# Patient Record
Sex: Male | Born: 1955
Health system: Southern US, Community
[De-identification: ages and names within clinical notes are randomized; demographics above are authoritative.]

## PROBLEM LIST (undated history)

## (undated) DIAGNOSIS — K121 Other forms of stomatitis: Secondary | ICD-10-CM

## (undated) DIAGNOSIS — K219 Gastro-esophageal reflux disease without esophagitis: Secondary | ICD-10-CM

## (undated) DIAGNOSIS — C7951 Secondary malignant neoplasm of bone: Secondary | ICD-10-CM

## (undated) DIAGNOSIS — F329 Major depressive disorder, single episode, unspecified: Secondary | ICD-10-CM

## (undated) DIAGNOSIS — K449 Diaphragmatic hernia without obstruction or gangrene: Secondary | ICD-10-CM

## (undated) DIAGNOSIS — J45909 Unspecified asthma, uncomplicated: Secondary | ICD-10-CM

## (undated) DIAGNOSIS — Z8619 Personal history of other infectious and parasitic diseases: Secondary | ICD-10-CM

## (undated) DIAGNOSIS — R059 Cough, unspecified: Secondary | ICD-10-CM

## (undated) DIAGNOSIS — F419 Anxiety disorder, unspecified: Secondary | ICD-10-CM

## (undated) DIAGNOSIS — F32A Depression, unspecified: Secondary | ICD-10-CM

## (undated) DIAGNOSIS — Z923 Personal history of irradiation: Secondary | ICD-10-CM

## (undated) DIAGNOSIS — Z86718 Personal history of other venous thrombosis and embolism: Secondary | ICD-10-CM

## (undated) DIAGNOSIS — G8929 Other chronic pain: Secondary | ICD-10-CM

## (undated) DIAGNOSIS — Z973 Presence of spectacles and contact lenses: Secondary | ICD-10-CM

## (undated) DIAGNOSIS — Z87820 Personal history of traumatic brain injury: Secondary | ICD-10-CM

## (undated) DIAGNOSIS — T7840XA Allergy, unspecified, initial encounter: Secondary | ICD-10-CM

## (undated) DIAGNOSIS — J22 Unspecified acute lower respiratory infection: Secondary | ICD-10-CM

## (undated) DIAGNOSIS — N200 Calculus of kidney: Secondary | ICD-10-CM

## (undated) DIAGNOSIS — M549 Dorsalgia, unspecified: Secondary | ICD-10-CM

## (undated) DIAGNOSIS — K5792 Diverticulitis of intestine, part unspecified, without perforation or abscess without bleeding: Secondary | ICD-10-CM

## (undated) DIAGNOSIS — C9000 Multiple myeloma not having achieved remission: Secondary | ICD-10-CM

## (undated) DIAGNOSIS — R05 Cough: Secondary | ICD-10-CM

## (undated) DIAGNOSIS — Z5189 Encounter for other specified aftercare: Secondary | ICD-10-CM

## (undated) HISTORY — DX: Encounter for other specified aftercare: Z51.89

## (undated) HISTORY — DX: Unspecified asthma, uncomplicated: J45.909

## (undated) HISTORY — DX: Multiple myeloma not having achieved remission: C90.00

## (undated) HISTORY — PX: LAPAROSCOPIC INGUINAL HERNIA REPAIR: SUR788

## (undated) HISTORY — PX: KYPHOPLASTY: SHX5884

## (undated) HISTORY — DX: Allergy, unspecified, initial encounter: T78.40XA

## (undated) HISTORY — DX: Personal history of other infectious and parasitic diseases: Z86.19

## (undated) HISTORY — DX: Personal history of irradiation: Z92.3

## (undated) HISTORY — PX: OTHER SURGICAL HISTORY: SHX169

## (undated) HISTORY — PX: TONSILLECTOMY: SUR1361

## (undated) HISTORY — DX: Diverticulitis of intestine, part unspecified, without perforation or abscess without bleeding: K57.92

## (undated) HISTORY — PX: COLONOSCOPY: SHX174

## (undated) HISTORY — PX: WISDOM TOOTH EXTRACTION: SHX21

---

## 2001-07-14 HISTORY — PX: ROTATOR CUFF REPAIR: SHX139

## 2008-06-21 ENCOUNTER — Encounter: Admission: RE | Admit: 2008-06-21 | Discharge: 2008-06-21 | Payer: Self-pay | Admitting: Sports Medicine

## 2008-07-03 ENCOUNTER — Encounter: Admission: RE | Admit: 2008-07-03 | Discharge: 2008-07-03 | Payer: Self-pay | Admitting: Sports Medicine

## 2010-03-14 ENCOUNTER — Ambulatory Visit: Payer: Self-pay | Admitting: Diagnostic Radiology

## 2010-03-14 ENCOUNTER — Ambulatory Visit (HOSPITAL_BASED_OUTPATIENT_CLINIC_OR_DEPARTMENT_OTHER): Admission: RE | Admit: 2010-03-14 | Discharge: 2010-03-14 | Payer: Self-pay | Admitting: Family Medicine

## 2010-03-15 ENCOUNTER — Encounter: Admission: RE | Admit: 2010-03-15 | Discharge: 2010-03-15 | Payer: Self-pay | Admitting: Family Medicine

## 2013-11-02 ENCOUNTER — Encounter (INDEPENDENT_AMBULATORY_CARE_PROVIDER_SITE_OTHER): Payer: Self-pay | Admitting: Surgery

## 2013-11-02 ENCOUNTER — Ambulatory Visit (INDEPENDENT_AMBULATORY_CARE_PROVIDER_SITE_OTHER): Payer: Managed Care, Other (non HMO) | Admitting: Surgery

## 2013-11-02 VITALS — BP 140/80 | HR 66 | Temp 97.7°F | Resp 16 | Ht 70.0 in | Wt 188.2 lb

## 2013-11-02 DIAGNOSIS — K4020 Bilateral inguinal hernia, without obstruction or gangrene, not specified as recurrent: Secondary | ICD-10-CM

## 2013-11-02 DIAGNOSIS — M545 Low back pain, unspecified: Secondary | ICD-10-CM | POA: Insufficient documentation

## 2013-11-02 NOTE — Patient Instructions (Signed)
Please consider the recommendations that we have given you today:  Consider surgery to repair the hernia in her left groin with laparoscopic exploration of right groin to make sure there is not a hernia as well.  See the Handout(s) we have given you.  Please call our office at [phone] if you wish to schedule surgery or if you have further questions / concerns.   Hernia A hernia occurs when an internal organ pushes out through a weak spot in the abdominal wall. Hernias most commonly occur in the groin and around the navel. Hernias often can be pushed back into place (reduced). Most hernias tend to get worse over time. Some abdominal hernias can get stuck in the opening (irreducible or incarcerated hernia) and cannot be reduced. An irreducible abdominal hernia which is tightly squeezed into the opening is at risk for impaired blood supply (strangulated hernia). A strangulated hernia is a medical emergency. Because of the risk for an irreducible or strangulated hernia, surgery may be recommended to repair a hernia. CAUSES   Heavy lifting.  Prolonged coughing.  Straining to have a bowel movement.  A cut (incision) made during an abdominal surgery. HOME CARE INSTRUCTIONS   Bed rest is not required. You may continue your normal activities.  Avoid lifting more than 10 pounds (4.5 kg) or straining.  Cough gently. If you are a smoker it is best to stop. Even the best hernia repair can break down with the continual strain of coughing. Even if you do not have your hernia repaired, a cough will continue to aggravate the problem.  Do not wear anything tight over your hernia. Do not try to keep it in with an outside bandage or truss. These can damage abdominal contents if they are trapped within the hernia sac.  Eat a normal diet.  Avoid constipation. Straining over long periods of time will increase hernia size and encourage breakdown of repairs. If you cannot do this with diet alone, stool  softeners may be used. SEEK IMMEDIATE MEDICAL CARE IF:   You have a fever.  You develop increasing abdominal pain.  You feel nauseous or vomit.  Your hernia is stuck outside the abdomen, looks discolored, feels hard, or is tender.  You have any changes in your bowel habits or in the hernia that are unusual for you.  You have increased pain or swelling around the hernia.  You cannot push the hernia back in place by applying gentle pressure while lying down. MAKE SURE YOU:   Understand these instructions.  Will watch your condition.  Will get help right away if you are not doing well or get worse. Document Released: 06/30/2005 Document Revised: 09/22/2011 Document Reviewed: 02/17/2008 [facility] Patient Information 2014 [place].  HERNIA REPAIR: POST OP INSTRUCTIONS  1. DIET: Follow a light bland diet the first 24 hours after arrival home, such as soup, liquids, crackers, etc.  Be sure to include lots of fluids daily.  Avoid fast food or heavy meals as your are more likely to get nauseated.  Eat a low fat the next few days after surgery. 2. Take your usually prescribed home medications unless otherwise directed. 3. PAIN CONTROL: a. Pain is best controlled by a usual combination of three different methods TOGETHER: i. Ice/Heat ii. Over the counter pain medication iii. Prescription pain medication b. Most patients will experience some swelling and bruising around the hernia(s) such as the bellybutton, groins, or old incisions.  Ice packs or heating pads (30-60 minutes up to  6 times a day) will help. Use ice for the first few days to help decrease swelling and bruising, then switch to heat to help relax tight/sore spots and speed recovery.  Some people prefer to use ice alone, heat alone, alternating between ice & heat.  Experiment to what works for you.  Swelling and bruising can take several weeks to resolve.   c. It is helpful to take an over-the-counter pain medication  regularly for the first few weeks.  Choose one of the following that works best for you: i. Naproxen (Aleve, etc)  Two 220mg  tabs twice a day ii. Ibuprofen (Advil, etc) Three 200mg  tabs four times a day (every meal & bedtime) iii. Acetaminophen (Tylenol, etc) 325-650mg  four times a day (every meal & bedtime) d. A  prescription for pain medication should be given to you upon discharge.  Take your pain medication as prescribed.  i. If you are having problems/concerns with the prescription medicine (does not control pain, nausea, vomiting, rash, itching, etc), please call us [phone] to see if we need to switch you to a different pain medicine that will work better for you and/or control your side effect better. ii. If you need a refill on your pain medication, please contact your pharmacy.  They will contact our office to request authorization. Prescriptions will not be filled after 5 pm or on week-ends. 4. Avoid getting constipated.  Between the surgery and the pain medications, it is common to experience some constipation.  Increasing fluid intake and taking a fiber supplement (such as Metamucil, Citrucel, FiberCon, MiraLax, etc) 1-2 times a day regularly will usually help prevent this problem from occurring.  A mild laxative (prune juice, Milk of Magnesia, MiraLax, etc) should be taken according to package directions if there are no bowel movements after 48 hours.   5. Wash / shower every day.  You may shower over the dressings as they are waterproof.   6. Remove your waterproof bandages 5 days after surgery.  You may leave the incision open to air.  You may replace a dressing/Band-Aid to cover the incision for comfort if you wish.  Continue to shower over incision(s) after the dressing is off.    7. ACTIVITIES as tolerated:   a. You may resume regular (light) daily activities beginning the next day-such as daily self-care, walking, climbing stairs-gradually increasing activities as tolerated.   If you can walk 30 minutes without difficulty, it is safe to try more intense activity such as jogging, treadmill, bicycling, low-impact aerobics, swimming, etc. b. Save the most intensive and strenuous activity for last such as sit-ups, heavy lifting, contact sports, etc  Refrain from any heavy lifting or straining until you are off narcotics for pain control.   c. DO NOT PUSH THROUGH PAIN.  Let pain be your guide: If it hurts to do something, don't do it.  Pain is your body warning you to avoid that activity for another week until the pain goes down. d. You may drive when you are no longer taking prescription pain medication, you can comfortably wear a seatbelt, and you can safely maneuver your car and apply brakes. e. Dennis Bast may have sexual intercourse when it is comfortable.  8. FOLLOW UP in our office a. Please call [facility] at [phone] to set up an appointment to see your surgeon in the office for a follow-up appointment approximately 2-3 weeks after your surgery. b. Make sure that you call for this appointment the day you arrive home  to insure a convenient appointment time. 9.  IF YOU HAVE DISABILITY OR FAMILY LEAVE FORMS, BRING THEM TO THE OFFICE FOR PROCESSING.  DO NOT GIVE THEM TO YOUR DOCTOR.  WHEN TO CALL us [phone]: 1. Poor pain control 2. Reactions / problems with new medications (rash/itching, nausea, etc)  3. Fever over 101.5 F (38.5 C) 4. Inability to urinate 5. Nausea and/or vomiting 6. Worsening swelling or bruising 7. Continued bleeding from incision. 8. Increased pain, redness, or drainage from the incision   The clinic staff is available to answer your questions during regular business hours (8:30am-5pm).  Please don't hesitate to call and ask to speak to one of our nurses for clinical concerns.   If you have a medical emergency, go to the nearest emergency room or call 911.  A surgeon from [facility] is always on call at the hospitals in  [facility], [place], [place], [place], [place]  [address] ?  [address] [address], [place], [place]   [address] MAIN: [phone] ? TOLL FREE: [phone] ? FAX: [phone] [email or web address]  Managing Pain  Pain after surgery or related to activity is often due to strain/injury to muscle, tendon, nerves and/or incisions.  This pain is usually short-term and will improve in a few months.   Many people find it helpful to do the following things TOGETHER to help speed the process of healing and to get back to regular activity more quickly:  1. Avoid heavy physical activity a.  no lifting greater than 20 pounds b. Do not "push through" the pain.  Listen to your body and avoid positions and maneuvers than reproduce the pain c. Walking is okay as tolerated, but go slowly and stop when getting sore.  d. Remember: If it hurts to do it, then don't do it! 2. Take Anti-inflammatory medication  a. Take with food/snack around the clock for 1-2 weeks i. This helps the muscle and nerve tissues become less irritable and calm down faster b. Choose ONE of the following over-the-counter medications: i. Naproxen 220mg  tabs (ex. Aleve) 1-2 pills twice a day  ii. Ibuprofen 200mg  tabs (ex. Advil, Motrin) 3-4 pills with every meal and just before bedtime iii. Acetaminophen 500mg  tabs (Tylenol) 1-2 pills with every meal and just before bedtime 3. Use a Heating pad or Ice/Cold Pack a. 4-6 times a day b. May use warm bath/hottub  or showers 4. Try Gentle Massage and/or Stretching  a. at the area of pain many times a day b. stop if you feel pain - do not overdo it  Try these steps together to help you body heal faster and avoid making things get worse.  Doing just one of these things may not be enough.    If you are not getting better after two weeks or are noticing you are getting worse, contact our office for further advice; we may need to re-evaluate you  & see what other things we can do to help.  Back Pain, Adult Low back pain is very common. About 1 in 5 people have back pain.The cause of low back pain is rarely dangerous. The pain often gets better over time.About half of people with a sudden onset of back pain feel better in just 2 weeks. About 8 in 10 people feel better by 6 weeks.  CAUSES Some common causes of back pain include:  Strain of the muscles or ligaments supporting the spine.  Wear and tear (degeneration)  of the spinal discs.  Arthritis.  Direct injury to the back. DIAGNOSIS Most of the time, the direct cause of low back pain is not known.However, back pain can be treated effectively even when the exact cause of the pain is unknown.Answering your caregiver's questions about your overall health and symptoms is one of the most accurate ways to make sure the cause of your pain is not dangerous. If your caregiver needs more information, he or she may order lab work or imaging tests (X-rays or MRIs).However, even if imaging tests show changes in your back, this usually does not require surgery. HOME CARE INSTRUCTIONS For many people, back pain returns.Since low back pain is rarely dangerous, it is often a condition that people can learn to [facility] their own.   Remain active. It is stressful on the back to sit or stand in one place. Do not sit, drive, or stand in one place for more than 30 minutes at a time. Take short walks on level surfaces as soon as pain allows.Try to increase the length of time you walk each day.  Do not stay in bed.Resting more than 1 or 2 days can delay your recovery.  Do not avoid exercise or work.Your body is made to move.It is not dangerous to be active, even though your back may hurt.Your back will likely heal faster if you return to being active before your pain is gone.  Pay attention to your body when you bend and lift. Many people have less discomfortwhen lifting if they bend their  knees, keep the load close to their bodies,and avoid twisting. Often, the most comfortable positions are those that put less stress on your recovering back.  Find a comfortable position to sleep. Use a firm mattress and lie on your side with your knees slightly bent. If you lie on your back, put a pillow under your knees.  Only take over-the-counter or prescription medicines as directed by your caregiver. Over-the-counter medicines to reduce pain and inflammation are often the most helpful.Your caregiver may prescribe muscle relaxant drugs.These medicines help dull your pain so you can more quickly return to your normal activities and healthy exercise.  Put ice on the injured area.  Put ice in a plastic bag.  Place a towel between your skin and the bag.  Leave the ice on for 15-20 minutes, 03-04 times a day for the first 2 to 3 days. After that, ice and heat may be alternated to reduce pain and spasms.  Ask your caregiver about trying back exercises and gentle massage. This may be of some benefit.  Avoid feeling anxious or stressed.Stress increases muscle tension and can worsen back pain.It is important to recognize when you are anxious or stressed and learn ways to manage it.Exercise is a great option. SEEK MEDICAL CARE IF:  You have pain that is not relieved with rest or medicine.  You have pain that does not improve in 1 week.  You have new symptoms.  You are generally not feeling well. SEEK IMMEDIATE MEDICAL CARE IF:   You have pain that radiates from your back into your legs.  You develop new bowel or bladder control problems.  You have unusual weakness or numbness in your arms or legs.  You develop nausea or vomiting.  You develop abdominal pain.  You feel faint. Document Released: 06/30/2005 Document Revised: 12/30/2011 Document Reviewed: 11/18/2010 [facility] Patient Information 2014 [place], [place].

## 2013-11-02 NOTE — Progress Notes (Signed)
Subjective:     Patient ID: Jorge [name], male   DOB: 1956-05-04, 58 y.o.   MRN: [id number]  HPI  Note: This dictation was prepared with Dragon/digital dictation along with [facility] technology. Any transcriptional errors that result from this process are unintentional.       Jorge [name]  05/13/1956 [id number]  Patient Care Team: Orpah Melter, MD as PCP - General (Family Medicine) Corine Shelter, PA-C as Physician Assistant (Physician Assistant)  This patient is a 58 y.o.male who presents today for surgical evaluation at the request of Corine Shelter, PA-C.   Reason for visit: LLQ abd bulge ? Hernia  Pleasant active male.  Does a lot of running an aggressive exercise.  Had an episode of sharp back pain after pulling luggage from a high location in the airport.  The noticed some bulging in his left groin.  Went to a Restaurant manager, fast food.  Back seems to be doing better.  Back exercising.  However concern for the bulge.  Saw his primary care physician.  They were concerned for an anal hernia.  Recommended surgical consultation.  Patient has no problems with urination.  No problem with erection or ejaculation.  Normally has a bowel movement every day.  Has had 2 colonoscopies, the last in 2013.  Does not recall anything about polyps.  No fevers or chills.  No personal nor family history of GI/colon cancer, inflammatory bowel disease, irritable bowel syndrome, allergy such as Celiac Sprue, dietary/dairy problems, colitis, ulcers nor gastritis.  No recent sick contacts/gastroenteritis.  No travel outside the country.  No changes in diet.  No dysphagia to solids or liquids.  No significant heartburn or reflux.  No hematochezia, hematemesis, coffee ground emesis.  No evidence of prior gastric/peptic ulceration. No history of skin infection for MRSA.  No history of abdominal or hernia surgeries.  There are no active problems to display for this patient.   History reviewed. No pertinent past medical  history.  Past Surgical History  Procedure Laterality Date  . Rotator cuff repair  2003    History   Social History  . Marital Status: Married    Spouse Name: N/A    Number of Children: N/A  . Years of Education: N/A   Occupational History  . Not on file.   Social History Main Topics  . Smoking status: Former Smoker    Quit date: 11/02/2013  . Smokeless tobacco: Not on file  . Alcohol Use: Yes  . Drug Use: No  . Sexual Activity: Not on file   Other Topics Concern  . Not on file   Social History Narrative  . No narrative on file    Family History  Problem Relation Age of Onset  . Cancer Mother     ovarian and colon  . Heart disease Father     Current Outpatient Prescriptions  Medication Sig Dispense Refill  . zolpidem (AMBIEN) 5 MG tablet Take 5 mg by mouth at bedtime as needed for sleep.       No current facility-administered medications for this visit.     No Known Allergies  BP 140/80  Pulse 66  Temp(Src) 97.7 F (36.5 C)  Resp 16  Ht 5\' 10"  (1.778 m)  Wt 188 lb 3.2 oz (85.367 kg)  BMI 27.00 kg/m2  No results found.   Review of Systems  Constitutional: Negative for fever, chills and diaphoresis.  HENT: Negative for ear discharge, facial swelling, mouth sores, nosebleeds, sore throat and trouble swallowing.   Eyes: Negative  for photophobia, discharge and visual disturbance.  Respiratory: Negative for choking, chest tightness, shortness of breath and stridor.   Cardiovascular: Negative for chest pain and palpitations.  Gastrointestinal: Negative for nausea, vomiting, abdominal pain, diarrhea, constipation, blood in stool, abdominal distention, anal bleeding and rectal pain.  Endocrine: Negative for cold intolerance and heat intolerance.  Genitourinary: Negative for dysuria, urgency, difficulty urinating and testicular pain.  Musculoskeletal: Positive for back pain. Negative for arthralgias, gait problem and myalgias.  Skin: Negative for color  change, pallor, rash and wound.  Allergic/Immunologic: Negative for environmental allergies and food allergies.  Neurological: Negative for dizziness, speech difficulty, weakness, numbness and headaches.  Hematological: Negative for adenopathy. Does not bruise/bleed easily.  Psychiatric/Behavioral: Negative for hallucinations, confusion and agitation.       Objective:   Physical Exam  Constitutional: He is oriented to person, place, and time. He appears well-developed and well-nourished. No distress.  HENT:  Head: Normocephalic.  Mouth/Throat: Oropharynx is clear and moist. No oropharyngeal exudate.  Eyes: Conjunctivae and EOM are normal. Pupils are equal, round, and reactive to light. No scleral icterus.  Neck: Normal range of motion. Neck supple. No tracheal deviation present.  Cardiovascular: Normal rate, regular rhythm and intact distal pulses.   Pulmonary/Chest: Effort normal and breath sounds normal. No respiratory distress.  Abdominal: Soft. He exhibits no distension. There is no tenderness. A hernia is present. Hernia confirmed positive in the left inguinal area.  Genitourinary: Testes normal and penis normal.    Right testis shows no mass and no tenderness. Right testis is descended. Left testis shows no mass and no tenderness. Left testis is descended. Circumcised.  Musculoskeletal: Normal range of motion. He exhibits no tenderness.  Lymphadenopathy:    He has no cervical adenopathy.       Right: No inguinal adenopathy present.       Left: No inguinal adenopathy present.  Neurological: He is alert and oriented to person, place, and time. No cranial nerve deficit. He exhibits normal muscle tone. Coordination normal.  Skin: Skin is warm and dry. No rash noted. He is not diaphoretic. No erythema. No pallor.  Psychiatric: He has a normal mood and affect. His behavior is normal. Judgment and thought content normal.       Assessment:     Definite left and probable right  inguinal hernias     Plan:     I think he would benefit from surgical repair given his young age and aggressive physical activity.  Likelihood of these becoming symptomatic and larger very high in his lifetime.  He wishes to be aggressive and have them repaired.  I discussed this with him:  The anatomy & physiology of the abdominal wall and pelvic floor was discussed.  The pathophysiology of hernias in the inguinal and pelvic region was discussed.  Natural history risks such as progressive enlargement, pain, incarceration & strangulation was discussed.   Contributors to complications such as smoking, obesity, diabetes, prior surgery, etc were discussed.    I feel the risks of no intervention will lead to serious problems that outweigh the operative risks; therefore, I recommended surgery to reduce and repair the hernia.  I explained laparoscopic techniques with possible need for an open approach.  I noted usual use of mesh to patch and/or buttress hernia repair  Risks such as bleeding, infection, abscess, need for further treatment, heart attack, death, and other risks were discussed.  I noted a good likelihood this will help address the problem.   Goals  of post-operative recovery were discussed as well.  Possibility that this will not correct all symptoms was explained.  I stressed the importance of low-impact activity, aggressive pain control, avoiding constipation, & not pushing through pain to minimize risk of post-operative chronic pain or injury. Possibility of reherniation was discussed.  We will work to minimize complications.     An educational handout further explaining the pathology & treatment options was given as well.  Questions were answered.  The patient expresses understanding & wishes to proceed with surgery.

## 2013-12-16 ENCOUNTER — Other Ambulatory Visit (INDEPENDENT_AMBULATORY_CARE_PROVIDER_SITE_OTHER): Payer: Self-pay

## 2013-12-16 DIAGNOSIS — K4020 Bilateral inguinal hernia, without obstruction or gangrene, not specified as recurrent: Secondary | ICD-10-CM

## 2013-12-16 MED ORDER — OXYCODONE-ACETAMINOPHEN 5-325 MG PO TABS: 1.0000 | ORAL_TABLET | Freq: Four times a day (QID) | ORAL | Status: DC | PRN

## 2014-01-04 ENCOUNTER — Ambulatory Visit (INDEPENDENT_AMBULATORY_CARE_PROVIDER_SITE_OTHER): Payer: Managed Care, Other (non HMO) | Admitting: Surgery

## 2014-01-04 ENCOUNTER — Encounter (INDEPENDENT_AMBULATORY_CARE_PROVIDER_SITE_OTHER): Payer: Self-pay | Admitting: Surgery

## 2014-01-04 VITALS — BP 142/82 | HR 60 | Temp 97.4°F | Resp 14 | Ht 70.0 in | Wt 185.6 lb

## 2014-01-04 DIAGNOSIS — K4020 Bilateral inguinal hernia, without obstruction or gangrene, not specified as recurrent: Secondary | ICD-10-CM

## 2014-01-04 NOTE — Progress Notes (Signed)
Subjective:     Patient ID: Jorge [name], male   DOB: [birth year], 58 y.o.   MRN: [id number]  HPI  Note: This dictation was prepared with Dragon/digital dictation along with [facility] technology. Any transcriptional errors that result from this process are unintentional.       Jorge [name]  September 09, 1955 [id number]  Patient Care Team: Orpah Melter, MD as PCP - General (Family Medicine) Corine Shelter, PA-C as Physician Assistant (Physician Assistant)  Procedure (Date: 12/16/2013):  Laparoscopic bilateral inguinal hernia repairs with mesh  This patient returns for surgical re-evaluation.  He is feeling well.  Back to some jogging but not all of runs yet.  Hoping to hike in [place], [place] in September.  Wants to get back to some weight training over the next month.  No problems with urination.  No constipation.  No fevers or chills.  Off narcotics.  Patient Active Problem List   Diagnosis Date Noted  . LBP (low back pain) 11/02/2013  . Bilateral inguinal hernia (BIH), s/p lap repair 12/16/2013 11/02/2013    History reviewed. No pertinent past medical history.  Past Surgical History  Procedure Laterality Date  . Rotator cuff repair  2003  . Colonoscopy  [id number]  . Inguinal hernia repair  12/16/13    BIH by Dr. Nivin Boston    History   Social History  . Marital Status: Married    Spouse Name: N/A    Number of Children: N/A  . Years of Education: N/A   Occupational History  . Not on file.   Social History Main Topics  . Smoking status: Former Smoker    Quit date: 11/02/2013  . Smokeless tobacco: Not on file  . Alcohol Use: Yes  . Drug Use: No  . Sexual Activity: Not on file   Other Topics Concern  . Not on file   Social History Narrative  . No narrative on file    Family History  Problem Relation Age of Onset  . Cancer Mother     ovarian and colon  . Heart disease Father     Current Outpatient Prescriptions  Medication Sig Dispense Refill  .  zolpidem (AMBIEN) 5 MG tablet Take 5 mg by mouth at bedtime as needed for sleep.       No current facility-administered medications for this visit.     No Known Allergies  BP 142/82  Pulse 60  Temp(Src) 97.4 F (36.3 C) (Temporal)  Resp 14  Ht 5\' 10"  (1.778 m)  Wt 185 lb 9.6 oz (84.188 kg)  BMI 26.63 kg/m2  No results found.   Review of Systems  Constitutional: Negative for fever, chills and diaphoresis.  HENT: Negative for sore throat and trouble swallowing.   Eyes: Negative for photophobia and visual disturbance.  Respiratory: Negative for choking and shortness of breath.   Cardiovascular: Negative for chest pain and palpitations.  Gastrointestinal: Negative for nausea, vomiting, abdominal distention, anal bleeding and rectal pain.  Genitourinary: Negative for dysuria, urgency, difficulty urinating and testicular pain.  Musculoskeletal: Negative for arthralgias, gait problem, myalgias and neck pain.  Skin: Negative for color change and rash.  Neurological: Negative for dizziness, speech difficulty, weakness and numbness.  Hematological: Negative for adenopathy.  Psychiatric/Behavioral: Negative for hallucinations, confusion and agitation.       Objective:   Physical Exam  Constitutional: He is oriented to person, place, and time. He appears well-developed and well-nourished. No distress.  HENT:  Head: Normocephalic.  Mouth/Throat: Oropharynx is clear and moist. No  oropharyngeal exudate.  Eyes: Conjunctivae and EOM are normal. Pupils are equal, round, and reactive to light. No scleral icterus.  Neck: Normal range of motion. No tracheal deviation present.  Cardiovascular: Normal rate, normal heart sounds and intact distal pulses.   Pulmonary/Chest: Effort normal. No respiratory distress.  Abdominal: Soft. He exhibits no distension. There is no tenderness. Hernia confirmed negative in the right inguinal area and confirmed negative in the left inguinal area.  Incisions  clean with normal healing ridges.  No hernias  Musculoskeletal: Normal range of motion. He exhibits no tenderness.  Neurological: He is alert and oriented to person, place, and time. No cranial nerve deficit. He exhibits normal muscle tone. Coordination normal.  Skin: Skin is warm and dry. No rash noted. He is not diaphoretic.  Psychiatric: He has a normal mood and affect. His behavior is normal.       Assessment:     Recovering well 2.5 weeks status post laparoscopic repair of bilateral pantaloon direct/indirect inguinal hernias.     Plan:     I am glad he is doing so well.  I hope that continues.  Increase activity as tolerated to regular activity.  Low impact exercise such as walking an hour a day at least ideal.  Do not push through pain.  Diet as tolerated.  Low fat high fiber diet ideal.  Bowel regimen with 30 g fiber a day and fiber supplement as needed to avoid problems.  Return to clinic as needed.   Instructions discussed.  Followup with primary care physician for other health issues as would normally be done.  Consider screening for malignancies (breast, prostate, colon, melanoma, etc) as appropriate.  Questions answered.  The patient expressed understanding and appreciation

## 2014-01-04 NOTE — Patient Instructions (Signed)
HERNIA REPAIR: POST OP INSTRUCTIONS  1. DIET: Follow a light bland diet the first 24 hours after arrival home, such as soup, liquids, crackers, etc.  Be sure to include lots of fluids daily.  Avoid fast food or heavy meals as your are more likely to get nauseated.  Eat a low fat the next few days after surgery. 2. Take your usually prescribed home medications unless otherwise directed. 3. PAIN CONTROL: a. Pain is best controlled by a usual combination of three different methods TOGETHER: i. Ice/Heat ii. Over the counter pain medication iii. Prescription pain medication b. Most patients will experience some swelling and bruising around the hernia(s) such as the bellybutton, groins, or old incisions.  Ice packs or heating pads (30-60 minutes up to 6 times a day) will help. Use ice for the first few days to help decrease swelling and bruising, then switch to heat to help relax tight/sore spots and speed recovery.  Some people prefer to use ice alone, heat alone, alternating between ice & heat.  Experiment to what works for you.  Swelling and bruising can take several weeks to resolve.   c. It is helpful to take an over-the-counter pain medication regularly for the first few weeks.  Choose one of the following that works best for you: i. Naproxen (Aleve, etc)  Two 220mg tabs twice a day ii. Ibuprofen (Advil, etc) Three 200mg tabs four times a day (every meal & bedtime) iii. Acetaminophen (Tylenol, etc) 325-650mg four times a day (every meal & bedtime) d. A  prescription for pain medication should be given to you upon discharge.  Take your pain medication as prescribed.  i. If you are having problems/concerns with the prescription medicine (does not control pain, nausea, vomiting, rash, itching, etc), please call us [phone] to see if we need to switch you to a different pain medicine that will work better for you and/or control your side effect better. ii. If you need a refill on your pain  medication, please contact your pharmacy.  They will contact our office to request authorization. Prescriptions will not be filled after 5 pm or on week-ends. 4. Avoid getting constipated.  Between the surgery and the pain medications, it is common to experience some constipation.  Increasing fluid intake and taking a fiber supplement (such as Metamucil, Citrucel, FiberCon, MiraLax, etc) 1-2 times a day regularly will usually help prevent this problem from occurring.  A mild laxative (prune juice, Milk of Magnesia, MiraLax, etc) should be taken according to package directions if there are no bowel movements after 48 hours.   5. Wash / shower every day.  You may shower over the dressings as they are waterproof.   6. Remove your waterproof bandages 5 days after surgery.  You may leave the incision open to air.  You may replace a dressing/Band-Aid to cover the incision for comfort if you wish.  Continue to shower over incision(s) after the dressing is off.    7. ACTIVITIES as tolerated:   a. You may resume regular (light) daily activities beginning the next day-such as daily self-care, walking, climbing stairs-gradually increasing activities as tolerated.  If you can walk 30 minutes without difficulty, it is safe to try more intense activity such as jogging, treadmill, bicycling, low-impact aerobics, swimming, etc. b. Save the most intensive and strenuous activity for last such as sit-ups, heavy lifting, contact sports, etc  Refrain from any heavy lifting or straining until you are off narcotics for pain control.     c. DO NOT PUSH THROUGH PAIN.  Let pain be your guide: If it hurts to do something, don't do it.  Pain is your body warning you to avoid that activity for another week until the pain goes down. d. You may drive when you are no longer taking prescription pain medication, you can comfortably wear a seatbelt, and you can safely maneuver your car and apply brakes. e. You may have sexual intercourse  when it is comfortable.  8. FOLLOW UP in our office a. Please call [facility] at [phone] to set up an appointment to see your surgeon in the office for a follow-up appointment approximately 2-3 weeks after your surgery. b. Make sure that you call for this appointment the day you arrive home to insure a convenient appointment time. 9.  IF YOU HAVE DISABILITY OR FAMILY LEAVE FORMS, BRING THEM TO THE OFFICE FOR PROCESSING.  DO NOT GIVE THEM TO YOUR DOCTOR.  WHEN TO CALL US [phone]: 1. Poor pain control 2. Reactions / problems with new medications (rash/itching, nausea, etc)  3. Fever over 101.5 F (38.5 C) 4. Inability to urinate 5. Nausea and/or vomiting 6. Worsening swelling or bruising 7. Continued bleeding from incision. 8. Increased pain, redness, or drainage from the incision   The clinic staff is available to answer your questions during regular business hours (8:30am-5pm).  Please don't hesitate to call and ask to speak to one of our nurses for clinical concerns.   If you have a medical emergency, go to the nearest emergency room or call 911.  A surgeon from [facility] is always on call at the hospitals in [place]  [facility] [address], [address], [place], [place]  [address] ?  [address] [address], [place],    [address] MAIN: [phone] ? TOLL FREE: [phone] ? FAX: [phone] [email or web address]  Managing Pain  Pain after surgery or related to activity is often due to strain/injury to muscle, tendon, nerves and/or incisions.  This pain is usually short-term and will improve in a few months.   Many people find it helpful to do the following things TOGETHER to help speed the process of healing and to get back to regular activity more quickly:  1. Avoid heavy physical activity a.  no lifting greater than 20 pounds b. Do not "push through" the pain.  Listen to your body and avoid positions and maneuvers than  reproduce the pain c. Walking is okay as tolerated, but go slowly and stop when getting sore.  d. Remember: If it hurts to do it, then don't do it! 2. Take Anti-inflammatory medication  a. Take with food/snack around the clock for 1-2 weeks i. This helps the muscle and nerve tissues become less irritable and calm down faster b. Choose ONE of the following over-the-counter medications: i. Naproxen 220mg tabs (ex. Aleve) 1-2 pills twice a day  ii. Ibuprofen 200mg tabs (ex. Advil, Motrin) 3-4 pills with every meal and just before bedtime iii. Acetaminophen 500mg tabs (Tylenol) 1-2 pills with every meal and just before bedtime 3. Use a Heating pad or Ice/Cold Pack a. 4-6 times a day b. May use warm bath/hottub  or showers 4. Try Gentle Massage and/or Stretching  a. at the area of pain many times a day b. stop if you feel pain - do not overdo it  Try these steps together to help you body heal faster and avoid making things get worse.  Doing just one of these   things may not be enough.    If you are not getting better after two weeks or are noticing you are getting worse, contact our office for further advice; we may need to re-evaluate you & see what other things we can do to help.   

## 2015-04-26 ENCOUNTER — Encounter: Payer: Self-pay | Admitting: Physical Therapy

## 2015-04-26 ENCOUNTER — Ambulatory Visit: Payer: 59 | Attending: Sports Medicine | Admitting: Physical Therapy

## 2015-04-26 DIAGNOSIS — M629 Disorder of muscle, unspecified: Secondary | ICD-10-CM | POA: Diagnosis present

## 2015-04-26 DIAGNOSIS — M5442 Lumbago with sciatica, left side: Secondary | ICD-10-CM | POA: Diagnosis present

## 2015-04-26 NOTE — Therapy (Signed)
[address] [place], [place], [address] Phone: [phone]   Fax:  [phone]  Physical Therapy Evaluation  Patient Details  Name: Jorge [name] MRN: [id number] Date of Birth: Aug 13, 1955 Referring Provider:  Berle Mull, MD  Encounter Date: 04/26/2015      PT End of Session - 04/26/15 0836    Visit Number 1   Date for PT Re-Evaluation 06/26/15   PT Start Time 0802   PT Stop Time 0900   PT Time Calculation (min) 58 min   Activity Tolerance Patient tolerated treatment well   Behavior During Therapy [facility] for tasks assessed/performed      History reviewed. No pertinent past medical history.  Past Surgical History  Procedure Laterality Date  . Rotator cuff repair  2003  . Colonoscopy  [id number]  . Inguinal hernia repair  12/16/13    BIH by Dr. Darrious Boston    There were no vitals filed for this visit.  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  Reports started having significant pain in the back in July, reports that he has had this problem before.   Limitations Sitting;Lifting;Standing;Walking   Diagnostic tests MRI shows HNP L4-5   Patient Stated Goals less pain for ADL's   Currently in Pain? Yes   Pain Score 2    Pain Location Back   Pain Orientation Lower   Pain Descriptors / Indicators Aching   Pain Type Chronic pain   Pain Onset More than a month ago   Pain Frequency Constant   Aggravating Factors  activity and lifting will increase pain to 8/10   Pain Relieving Factors rest pain can be 1/10   Effect of Pain on Daily Activities limits me             [facility] PT Assessment - 04/26/15 0001    Assessment   Medical Diagnosis low back pain   Onset Date/Surgical Date 04/20/15   Prior Therapy chiropractor   Precautions   Precautions None   Balance Screen   Has the patient fallen in the past 6 months No   Has the patient had a decrease in activity level because of a fear of falling?  No   Is the patient reluctant to leave their home because of a fear of falling?  No   Home Environment   Additional Comments some housework   Prior Function   Level of Independence Independent   Vocation Full time employment   Vocation Requirements cleans windows, pressure washer, own company   Leisure 5x/week at [employer] and was running daily   Posture/Postural Control   Posture Comments fwd head, rounded shoulders, presents with a right lateral shift   AROM   Overall AROM Comments lumbar ROM decreased 50% with pain and tightness   Strength   Overall Strength Comments WFL's but he reports that he feels that he is weaker than previously   Palpation   Palpation comment tight and tender in the left lumbar and left buttock   Special Tests    Special Tests --  negative slump and SLR today  [facility] Adult PT Treatment/Exercise - 04/26/15 0001    Modalities   Modalities Electrical Stimulation;Moist Heat;Traction   Moist Heat Therapy   Number Minutes Moist Heat 15 Minutes   Moist Heat Location Lumbar Spine   Electrical Stimulation   Electrical Stimulation Location left lumbar   Electrical Stimulation Action IFC   Electrical Stimulation Parameters tolerance   Electrical Stimulation Goals Pain   Traction   Type of Traction Lumbar   Min (lbs) 65   Hold Time static   Time 15                  PT Short Term Goals - 04/26/15 4680    PT SHORT TERM GOAL #1   Title independent with initial HEP   Time 1   Period Weeks   Status New           PT Long Term Goals - 04/26/15 0840    PT LONG TERM GOAL #1   Title  understand proper posture and body mechanics   Time 8   Period Weeks   Status New   PT LONG TERM GOAL #2   Title increase Lumbar ROM to WFL's   Time 8   Period Weeks   Status New   PT LONG TERM GOAL #3   Title decrease pain 50%   Time 8   Period Weeks   Status New   PT LONG TERM GOAL #4   Title present with no lateral shift   Time 8   Period Weeks   Status New               Plan - 04/26/15 3212    Clinical Impression Statement Patient with left low back pain, MRI shows HNP atr L4-5.  HAs a lateral shift to the right.  Very tight HS and piriformis mms   Pt will benefit from skilled therapeutic intervention in order to improve on the following deficits Decreased range of motion;Decreased strength;Increased muscle spasms;Impaired flexibility;Postural dysfunction;Improper body mechanics;Pain   Rehab Potential Good   PT Frequency 2x / week   PT Duration 8 weeks   PT Treatment/Interventions ADLs/Self Care Home Management;Electrical Stimulation;Moist Heat;Therapeutic exercise;Traction;Patient/family education;Manual techniques;Passive range of motion   PT Next Visit Plan Correct lateral shift, McKenzie exercises, core and flexibility   Consulted and Agree with Plan of Care Patient         Problem List Patient Active Problem List   Diagnosis Date Noted  . LBP (low back pain) 11/02/2013  . Bilateral inguinal hernia (BIH), s/p lap repair 12/16/2013 11/02/2013    Sumner Boast., PT 04/26/2015, 8:43 AM  [address] [place], [place], [address] Phone: [phone]   Fax:  [phone]

## 2015-04-26 NOTE — Patient Instructions (Signed)
Back Hyperextension: Using Arms   Lying face down with arms bent, inhale. Then while exhaling, straighten arms. Hold __2__ seconds. Let hips sag toward floor.  Slowly return to starting position. Repeat _10___ times per set. Do _2___ sets per session. Do __2__ sessions per day.  Back Namaste   Stand, feet in stride stance. Rotate back leg out about 20, keeping heel on floor. Stand, hands on low back. Press hips forward and arch back. Hold _2__ seconds. Repeat 10___ times per session. Do _2__ sessions per day.

## 2015-05-01 ENCOUNTER — Encounter: Payer: Self-pay | Admitting: Physical Therapy

## 2015-05-01 ENCOUNTER — Ambulatory Visit: Payer: 59 | Admitting: Physical Therapy

## 2015-05-01 DIAGNOSIS — M629 Disorder of muscle, unspecified: Secondary | ICD-10-CM

## 2015-05-01 DIAGNOSIS — M5442 Lumbago with sciatica, left side: Secondary | ICD-10-CM | POA: Diagnosis not present

## 2015-05-01 NOTE — Therapy (Signed)
[address] [place], [place], [address] Phone: [phone]   Fax:  [phone]  Physical Therapy Treatment  Patient Details  Name: Jorge [name] MRN: [id number] Date of Birth: April 02, 1956 No Data Recorded  Encounter Date: 05/01/2015      PT End of Session - 05/01/15 1608    Visit Number 2   Date for PT Re-Evaluation 06/26/15   PT Start Time 1532   PT Stop Time 1619   PT Time Calculation (min) 47 min   Activity Tolerance Patient tolerated treatment well   Behavior During Therapy [facility] for tasks assessed/performed      History reviewed. No pertinent past medical history.  Past Surgical History  Procedure Laterality Date  . Rotator cuff repair  2003  . Colonoscopy  [id number]  . Inguinal hernia repair  12/16/13    BIH by Dr. Anmol Boston    There were no vitals filed for this visit.  Visit Diagnosis:  Left-sided low back pain with left-sided sciatica  Hamstring tightness of both lower extremities      Subjective Assessment - 05/01/15 1536    Subjective I think that last treatment helped.  I have been very busy today and I am hurting some now   Currently in Pain? Yes   Pain Score 2    Pain Location Back   Pain Orientation Lower   Pain Descriptors / Indicators Aching   Pain Type Chronic pain                         [facility] Adult PT Treatment/Exercise - 05/01/15 0001    Exercises   Exercises Lumbar   Lumbar Exercises: Stretches   Passive Hamstring Stretch 30 seconds;3 reps   Press Ups 5 reps;10 seconds   Quad Stretch 20 seconds;3 reps   Piriformis Stretch 20 seconds;3 reps   Lumbar Exercises: Aerobic   UBE (Upper Arm Bike) NuStep L6 x 5 minutes   Lumbar Exercises: Machines for Strengthening   Other Lumbar Machine Exercise seated rows 35#, lats 35#   Other Lumbar Machine Exercise 10# hip extension and abduction, 15# obliques   Moist Heat Therapy   Number Minutes Moist Heat  15 Minutes   Moist Heat Location Lumbar Spine   Electrical Stimulation   Electrical Stimulation Location left lumbar   Electrical Stimulation Action IFC   Electrical Stimulation Parameters tolerance   Electrical Stimulation Goals Pain   Traction   Type of Traction Lumbar   Min (lbs) 65   Hold Time static   Time 15                PT Education - 05/01/15 1607    Education provided Yes   Education Details Reviewed HEP and 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) Educated Patient   Methods Explanation;Demonstration   Comprehension Verbalized understanding          PT Short Term Goals - 04/26/15 0838    PT SHORT TERM GOAL #1   Title independent with initial HEP   Time 1   Period Weeks   Status New           PT Long Term Goals - 04/26/15 0840    PT LONG TERM GOAL #1   Title understand proper posture and body mechanics   Time 8   Period Weeks   Status New   PT LONG TERM GOAL #2   Title increase Lumbar  ROM to WFL's   Time 8   Period Weeks   Status New   PT LONG TERM GOAL #3   Title decrease pain 50%   Time 8   Period Weeks   Status New   PT LONG TERM GOAL #4   Title present with no lateral shift   Time 8   Period Weeks   Status New               Plan - 05/01/15 1614    Clinical Impression Statement Tolerated exercisesd well today, still very tight in the LE's   PT Next Visit Plan Correct lateral shift, McKenzie exercises, core and flexibility, add gym exercises   Consulted and Agree with Plan of Care Patient        Problem List Patient Active Problem List   Diagnosis Date Noted  . LBP (low back pain) 11/02/2013  . Bilateral inguinal hernia (BIH), s/p lap repair 12/16/2013 11/02/2013    Sumner Boast., PT 05/01/2015, 4:26 PM  [address] [place], [place], [address] Phone: [phone]   Fax:  [phone]  Name: Jorge [name] MRN: [id number] Date  of Birth: 1956-06-18

## 2015-05-03 ENCOUNTER — Ambulatory Visit: Payer: 59 | Admitting: Physical Therapy

## 2015-05-03 ENCOUNTER — Encounter: Payer: Self-pay | Admitting: Physical Therapy

## 2015-05-03 DIAGNOSIS — M629 Disorder of muscle, unspecified: Secondary | ICD-10-CM

## 2015-05-03 DIAGNOSIS — M5442 Lumbago with sciatica, left side: Secondary | ICD-10-CM

## 2015-05-03 NOTE — Therapy (Signed)
[place] [address] [place], [place], [address] Phone: [phone]   Fax:  [phone]  Physical Therapy Treatment  Patient Details  Name: Kaulana Brindle MRN: [id number] Date of Birth: 12-Apr-1956 No Data Recorded  Encounter Date: 05/03/2015      PT End of Session - 05/03/15 1653    Visit Number 3   Date for PT Re-Evaluation 06/26/15   PT Start Time 1605   PT Stop Time 1705   PT Time Calculation (min) 60 min   Activity Tolerance Patient tolerated treatment well   Behavior During Therapy [facility] for tasks assessed/performed      History reviewed. No pertinent past medical history.  Past Surgical History  Procedure Laterality Date  . Rotator cuff repair  2003  . Colonoscopy  [id number]  . Inguinal hernia repair  12/16/13    BIH by Dr. Quron Boston    There were no vitals filed for this visit.  Visit Diagnosis:  Left-sided low back pain with left-sided sciatica  Hamstring tightness of both lower extremities      Subjective Assessment - 05/03/15 1651    Subjective I was working very hard today, very tired and sore.   Currently in Pain? Yes   Pain Score 4    Pain Location Back   Pain Orientation Lower   Pain Descriptors / Indicators Aching;Tightness                         [facility] Adult PT Treatment/Exercise - 05/03/15 0001    Lumbar Exercises: Stretches   Passive Hamstring Stretch 30 seconds;3 reps   Press Ups 5 reps;10 seconds   Quad Stretch 20 seconds;3 reps   ITB Stretch 30 seconds;3 reps   Piriformis Stretch 20 seconds;3 reps   Lumbar Exercises: Aerobic   UBE (Upper Arm Bike) NuStep L6 x 5 minutes   Moist Heat Therapy   Number Minutes Moist Heat 15 Minutes   Moist Heat Location Lumbar Spine   Electrical Stimulation   Electrical Stimulation Location left lumbar   Electrical Stimulation Action IFC   Electrical Stimulation Parameters tolerance   Electrical Stimulation Goals Pain    Traction   Type of Traction Lumbar   Min (lbs) 65   Hold Time static   Time 15                  PT Short Term Goals - 04/26/15 0254    PT SHORT TERM GOAL #1   Title independent with initial HEP   Time 1   Period Weeks   Status New           PT Long Term Goals - 04/26/15 0840    PT LONG TERM GOAL #1   Title understand proper posture and body mechanics   Time 8   Period Weeks   Status New   PT LONG TERM GOAL #2   Title increase Lumbar ROM to WFL's   Time 8   Period Weeks   Status New   PT LONG TERM GOAL #3   Title decrease pain 50%   Time 8   Period Weeks   Status New   PT LONG TERM GOAL #4   Title present with no lateral shift   Time 8   Period Weeks   Status New               Plan - 05/03/15 1655    Clinical Impression  Statement Very tight in the LE's, reports that after stretching he feels much better.   PT Next Visit Plan Correct lateral shift, McKenzie exercises, core and flexibility, add gym exercises   Consulted and Agree with Plan of Care Patient        Problem List Patient Active Problem List   Diagnosis Date Noted  . LBP (low back pain) 11/02/2013  . Bilateral inguinal hernia (BIH), s/p lap repair 12/16/2013 11/02/2013    Sumner Boast., PT 05/03/2015, 4:57 PM  [address] [place], [place], [address] Phone: [phone]   Fax:  [phone]  Name: Montell Leopard MRN: [id number] Date of Birth: 06/24/1956

## 2015-05-08 ENCOUNTER — Encounter: Payer: Self-pay | Admitting: Physical Therapy

## 2015-05-08 ENCOUNTER — Ambulatory Visit: Payer: 59 | Admitting: Physical Therapy

## 2015-05-08 DIAGNOSIS — M629 Disorder of muscle, unspecified: Secondary | ICD-10-CM

## 2015-05-08 DIAGNOSIS — M5442 Lumbago with sciatica, left side: Secondary | ICD-10-CM

## 2015-05-08 NOTE — Therapy (Signed)
[address] [place], [place], [address] Phone: [phone]   Fax:  [phone]  Physical Therapy Treatment  Patient Details  Name: Nijee Heatwole MRN: [id number] Date of Birth: 1955-09-09 No Data Recorded  Encounter Date: 05/08/2015      PT End of Session - 05/08/15 1748    Visit Number 4   Date for PT Re-Evaluation 06/26/15   PT Start Time 1657   PT Stop Time 1758   PT Time Calculation (min) 61 min   Activity Tolerance Patient tolerated treatment well   Behavior During Therapy [facility] for tasks assessed/performed      History reviewed. No pertinent past medical history.  Past Surgical History  Procedure Laterality Date  . Rotator cuff repair  2003  . Colonoscopy  [id number]  . Inguinal hernia repair  12/16/13    BIH by Dr. Bhavesh Boston    There were no vitals filed for this visit.  Visit Diagnosis:  Left-sided low back pain with left-sided sciatica  Hamstring tightness of both lower extremities      Subjective Assessment - 05/08/15 1745    Subjective I feel like I am getting stronger, I was able to lift things at work today that I could not lift 2 weeks ago   Currently in Pain? Yes   Pain Score 4    Pain Location Back   Pain Orientation Lower   Pain Descriptors / Indicators Aching                         [facility] Adult PT Treatment/Exercise - 05/08/15 0001    Therapeutic Activites    Therapeutic Activities Work [employer]   Work [employer] went over [employer], in/out of car, lifting, foot prop, golfers lift   Lumbar Exercises: Hydrologist 30 seconds;3 Designer, television/film set 20 seconds;3 reps   ITB Stretch 30 seconds;3 reps   Piriformis Stretch 20 seconds;3 reps   Lumbar Exercises: Aerobic   UBE (Upper Arm Bike) NuStep L6 x 5 minutes   Lumbar Exercises: Machines for Strengthening   Other Lumbar Machine Exercise seated rows 35#, lats 35#   Other Lumbar  Machine Exercise 10# hip extension and abduction, 15# obliques   Moist Heat Therapy   Number Minutes Moist Heat 15 Minutes   Moist Heat Location Lumbar Spine   Electrical Stimulation   Electrical Stimulation Location left lumbar   Electrical Stimulation Action IFC   Electrical Stimulation Parameters tolerance   Electrical Stimulation Goals Pain   Traction   Type of Traction Lumbar   Min (lbs) 65   Hold Time static   Time 15                  PT Short Term Goals - 05/08/15 1749    PT SHORT TERM GOAL #1   Title independent with initial HEP   Status Achieved           PT Long Term Goals - 04/26/15 0840    PT LONG TERM GOAL #1   Title understand proper posture and body mechanics   Time 8   Period Weeks   Status New   PT LONG TERM GOAL #2   Title increase Lumbar ROM to WFL's   Time 8   Period Weeks   Status New   PT LONG TERM GOAL #3   Title decrease pain 50%   Time 8  Period Weeks   Status New   PT LONG TERM GOAL #4   Title present with no lateral shift   Time 8   Period Weeks   Status New               Plan - 05/08/15 1748    Clinical Impression Statement Very tight hip flexors and quads, needed help with in and out of car as he reported that today getting in and out of the car hurt him the most   PT Next Visit Plan continue with exercises   Consulted and Agree with Plan of Care Patient        Problem List Patient Active Problem List   Diagnosis Date Noted  . LBP (low back pain) 11/02/2013  . Bilateral inguinal hernia (BIH), s/p lap repair 12/16/2013 11/02/2013    Sumner Boast., PT 05/08/2015, 5:50 PM  [place] [address] [place], [place], [address] Phone: [phone]   Fax:  [phone]  Name: Rana Adorno MRN: [id number] Date of Birth: 1956/05/10

## 2015-05-15 ENCOUNTER — Ambulatory Visit: Payer: 59 | Admitting: Physical Therapy

## 2015-05-16 ENCOUNTER — Ambulatory Visit: Payer: 59 | Attending: Sports Medicine | Admitting: Physical Therapy

## 2015-05-16 ENCOUNTER — Encounter: Payer: Self-pay | Admitting: Physical Therapy

## 2015-05-16 DIAGNOSIS — M629 Disorder of muscle, unspecified: Secondary | ICD-10-CM | POA: Insufficient documentation

## 2015-05-16 DIAGNOSIS — M5442 Lumbago with sciatica, left side: Secondary | ICD-10-CM | POA: Diagnosis present

## 2015-05-16 NOTE — Therapy (Signed)
[place], [place], [address] Phone: [phone]   Fax:  [phone]  Physical Therapy Treatment  Patient Details  Name: Jorge [name] MRN: [id number] Date of Birth: August 08, 1955 No Data Recorded  Encounter Date: 05/16/2015      PT End of Session - 05/16/15 1737    Visit Number 5   PT Start Time 0501   PT Stop Time 0548   PT Time Calculation (min) 47 min      History reviewed. No pertinent past medical history.  Past Surgical History  Procedure Laterality Date  . Rotator cuff repair  2003  . Colonoscopy  [id number]  . Inguinal hernia repair  12/16/13    BIH by Dr. Valeriano Boston    There were no vitals filed for this visit.  Visit Diagnosis:  Left-sided low back pain with left-sided sciatica  Hamstring tightness of both lower extremities      Subjective Assessment - 05/16/15 1703    Subjective Pt pain has been decreasing significantly. He reports that he is back in the gym doing cardio and light lifting.  Getting in and out of his vehicles irritates in more than anything.   Currently in Pain? Yes   Pain Score 1    Pain Location Back   Pain Orientation Lower                         [facility] Adult PT Treatment/Exercise - 05/16/15 0001    Lumbar Exercises: Stretches   Passive Hamstring Stretch 30 seconds;3 reps   Press Ups 5 reps;10 seconds   Quad Stretch 20 seconds;3 reps   ITB Stretch 30 seconds;3 reps   Piriformis Stretch 20 seconds;3 reps   Lumbar Exercises: Aerobic   UBE (Upper Arm Bike) NuStep L6 x 6 minutes   Lumbar Exercises: Machines for Strengthening   Other Lumbar Machine Exercise seated rows 35#, lats 35#   Other Lumbar Machine Exercise 10# hip extension and abduction, 15# obliques   Moist Heat Therapy   Number Minutes Moist Heat 15 Minutes   Moist Heat Location Lumbar Spine   Electrical Stimulation   Electrical Stimulation Location left lumbar   Electrical Stimulation Action IFC   Electrical Stimulation Parameters tolerance    Electrical Stimulation Goals Pain   Traction   Type of Traction Lumbar   Min (lbs) 65   Hold Time static   Time 15                  PT Short Term Goals - 05/08/15 1749    PT SHORT TERM GOAL #1   Title independent with initial HEP   Status Achieved           PT Long Term Goals - 04/26/15 0840    PT LONG TERM GOAL #1   Title understand proper posture and body mechanics   Time 8   Period Weeks   Status New   PT LONG TERM GOAL #2   Title increase Lumbar ROM to WFL's   Time 8   Period Weeks   Status New   PT LONG TERM GOAL #3   Title decrease pain 50%   Time 8   Period Weeks   Status New   PT LONG TERM GOAL #4   Title present with no lateral shift   Time 8   Period Weeks   Status New  Plan - 05/16/15 1740    Clinical Impression Statement Pt tolerated exercises well without increase complaints of pain. Pts demonstrates increased hamstring flexibility. Unable to stretch hamstrings in prone secondary to cramping, able to gain more ROM in sidelying.    PT Next Visit Plan Continue to work on stretching and lumbar strengthening.         Problem List Patient Active Problem List   Diagnosis Date Noted  . LBP (low back pain) 11/02/2013  . Bilateral inguinal hernia (BIH), s/p lap repair 12/16/2013 11/02/2013    Crist Fat, SPTA 05/16/2015, 5:43 PM  [address] [place], [place], [address] Phone: [phone]   Fax:  [phone]  Name: Winfrey Chillemi MRN: [id number] Date of Birth: 17-Jul-1955

## 2015-05-24 ENCOUNTER — Encounter: Payer: Self-pay | Admitting: Physical Therapy

## 2015-05-24 ENCOUNTER — Ambulatory Visit: Payer: 59 | Admitting: Physical Therapy

## 2015-05-24 DIAGNOSIS — M5442 Lumbago with sciatica, left side: Secondary | ICD-10-CM

## 2015-05-24 DIAGNOSIS — M629 Disorder of muscle, unspecified: Secondary | ICD-10-CM

## 2015-05-24 NOTE — Therapy (Signed)
[address] [address] [place], [place], [address] Phone: [phone]   Fax:  [phone]  Physical Therapy Treatment  Patient Details  Name: Jorge [name] MRN: [id number] Date of Birth: 1955-11-22 No Data Recorded  Encounter Date: 05/24/2015      PT End of Session - 05/24/15 1733    Visit Number 6   Date for PT Re-Evaluation 06/26/15   PT Start Time 1700   PT Stop Time 1750   PT Time Calculation (min) 50 min   Activity Tolerance Patient tolerated treatment well   Behavior During Therapy [facility] for tasks assessed/performed      History reviewed. No pertinent past medical history.  Past Surgical History  Procedure Laterality Date  . Rotator cuff repair  2003  . Colonoscopy  [id number]  . Inguinal hernia repair  12/16/13    BIH by Dr. Kaylon Boston    There were no vitals filed for this visit.  Visit Diagnosis:  Left-sided low back pain with left-sided sciatica  Hamstring tightness of both lower extremities      Subjective Assessment - 05/24/15 1702    Subjective I have been doing very well.  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 Yes   Pain Score 3    Pain Location Back   Pain Orientation Lower   Pain Descriptors / Indicators Aching;Tightness   Pain Type Chronic pain   Aggravating Factors  sitting   Pain Relieving Factors easy movements                         [facility] Adult PT Treatment/Exercise - 05/24/15 0001    Lumbar Exercises: Stretches   Passive Hamstring Stretch 30 seconds;3 reps   Press Ups 5 reps;10 seconds   Quad Stretch 20 seconds;3 reps   ITB Stretch 30 seconds;3 reps   Piriformis Stretch 20 seconds;3 reps   Lumbar Exercises: Aerobic   UBE (Upper Arm Bike) NuStep L6 x 6 minutes   Moist Heat Therapy   Number Minutes Moist Heat 15 Minutes   Moist Heat Location Lumbar Spine   Electrical Stimulation   Electrical Stimulation Location left lumbar   Electrical Stimulation Action IFC   Electrical Stimulation Parameters tolerance   Electrical Stimulation Goals Pain   Traction   Type of Traction Lumbar   Min (lbs) 65   Hold Time static   Time 15                  PT Short Term Goals - 05/08/15 1749    PT SHORT TERM GOAL #1   Title independent with initial HEP   Status Achieved           PT Long Term Goals - 05/24/15 1735    PT LONG TERM GOAL #1   Title understand proper posture and body mechanics   Status Achieved   PT LONG TERM GOAL #2   Title increase Lumbar ROM to WFL's   Status Partially Met   PT LONG TERM GOAL #3   Title decrease pain 50%   Status Partially Met   PT LONG TERM GOAL #4   Title present with no lateral shift   Status Achieved               Plan - 05/24/15  1734    Clinical Impression Statement Patient reports overall he has been doing well but sitting at the hospital has caused an increase in pain and stiffness.  His flexibility today was less but I was able to get him to previous levels   PT Next Visit Plan Continue to work on stretching and lumbar strengthening.    Consulted and Agree with Plan of Care Patient        Problem List Patient Active Problem List   Diagnosis Date Noted  . LBP (low back pain) 11/02/2013  . Bilateral inguinal hernia (BIH), s/p lap repair 12/16/2013 11/02/2013    Sumner Boast., PT 05/24/2015, 5:36 PM  [address] [address] [place], [place], [address] Phone: [phone]   Fax:  [phone]  Name: Jorge [name] MRN: [id number] Date of Birth: [birth year]

## 2015-05-31 ENCOUNTER — Ambulatory Visit: Payer: 59 | Admitting: Physical Therapy

## 2015-05-31 ENCOUNTER — Encounter: Payer: Self-pay | Admitting: Physical Therapy

## 2015-05-31 DIAGNOSIS — M5442 Lumbago with sciatica, left side: Secondary | ICD-10-CM | POA: Diagnosis not present

## 2015-05-31 DIAGNOSIS — M629 Disorder of muscle, unspecified: Secondary | ICD-10-CM

## 2015-05-31 NOTE — Therapy (Signed)
[place], [place], [address] Phone: [phone]   Fax:  [phone]  Physical Therapy Treatment  Patient Details  Name: Jorge [name] MRN: [id number] Date of Birth: Oct 09, 1955 No Data Recorded  Encounter Date: 05/31/2015      PT End of Session - 05/31/15 1727    Visit Number 7   Date for PT Re-Evaluation 06/26/15   PT Start Time 1700   PT Stop Time 1751   PT Time Calculation (min) 51 min      History reviewed. No pertinent past medical history.  Past Surgical History  Procedure Laterality Date  . Rotator cuff repair  2003  . Colonoscopy  [id number]  . Inguinal hernia repair  12/16/13    BIH by Dr. Ken Boston    There were no vitals filed for this visit.  Visit Diagnosis:  Left-sided low back pain with left-sided sciatica  Hamstring tightness of both lower extremities      Subjective Assessment - 05/31/15 1701    Subjective Pt reports that his pain continues to decrease. He does c/o increased pain after standing all day or with quick movements.     Currently in Pain? Yes   Pain Score 1    Pain Location Back   Pain Orientation Lower   Pain Descriptors / Indicators Aching;Tightness   Pain Type Chronic pain                         [facility] Adult PT Treatment/Exercise - 05/31/15 0001    Lumbar Exercises: Stretches   Passive Hamstring Stretch 30 seconds;3 reps   Press Ups 5 reps;10 seconds   Quad Stretch 20 seconds;3 reps   ITB Stretch 30 seconds;3 reps   Piriformis Stretch 20 seconds;3 reps   Lumbar Exercises: Machines for Strengthening   Other Lumbar Machine Exercise seated rows 35#, lats 35#   Other Lumbar Machine Exercise 10# hip extension and abduction, 15# obliques   Moist Heat Therapy   Number Minutes Moist Heat 10 Minutes   Moist Heat Location Lumbar Spine   Electrical Stimulation   Electrical Stimulation Location left lumbar   Electrical Stimulation  Action IFC   Electrical Stimulation Parameters tolerance   Electrical Stimulation Goals Pain   Traction   Type of Traction Lumbar   Min (lbs) 65   Hold Time static   Time 10   Manual Therapy   Manual Therapy Passive ROM                  PT Short Term Goals - 05/08/15 1749    PT SHORT TERM GOAL #1   Title independent with initial HEP   Status Achieved           PT Long Term Goals - 05/24/15 1735    PT LONG TERM GOAL #1   Title understand proper posture and body mechanics   Status Achieved   PT LONG TERM GOAL #2   Title increase Lumbar ROM to WFL's   Status Partially Met   PT LONG TERM GOAL #3   Title decrease pain 50%   Status Partially Met   PT LONG TERM GOAL #4   Title present with no lateral shift   Status Achieved               Plan - 05/31/15 1729    Clinical Impression Statement Pt able to tolerate all exercises without any indication of  pain. Reports he is exercising outside of therapy and maybe doing too much physical labor which irritates his low back.    PT Next Visit Plan Add more resistive exercises for lumbar and LE's.         Problem List Patient Active Problem List   Diagnosis Date Noted  . LBP (low back pain) 11/02/2013  . Bilateral inguinal hernia (BIH), s/p lap repair 12/16/2013 11/02/2013    Crist Fat, SPTA 05/31/2015, 5:42 PM  [address] [place], [place], [address] Phone: [phone]   Fax:  [phone]  Name: Jorge [name] MRN: [id number] Date of Birth: 09/18/1955

## 2015-06-14 ENCOUNTER — Encounter: Payer: Self-pay | Admitting: Physical Therapy

## 2015-06-14 ENCOUNTER — Ambulatory Visit: Payer: 59 | Attending: Sports Medicine | Admitting: Physical Therapy

## 2015-06-14 DIAGNOSIS — M629 Disorder of muscle, unspecified: Secondary | ICD-10-CM | POA: Diagnosis present

## 2015-06-14 DIAGNOSIS — M5442 Lumbago with sciatica, left side: Secondary | ICD-10-CM | POA: Diagnosis present

## 2015-06-14 NOTE — Therapy (Signed)
[place], [place], [address] Phone: [phone]   Fax:  [phone]  Physical Therapy Treatment  Patient Details  Name: Jorge [name] MRN: [id number] Date of Birth: [birth year] No Data Recorded  Encounter Date: 06/14/2015      PT End of Session - 06/14/15 1731    Visit Number 8   Date for PT Re-Evaluation 06/26/15   PT Start Time 1700   PT Stop Time 1730   PT Time Calculation (min) 30 min      History reviewed. No pertinent past medical history.  Past Surgical History  Procedure Laterality Date  . Rotator cuff repair  2003  . Colonoscopy  [id number]  . Inguinal hernia repair  12/16/13    BIH by Dr. Sevag Boston    There were no vitals filed for this visit.  Visit Diagnosis:  Left-sided low back pain with left-sided sciatica  Hamstring tightness of both lower extremities      Subjective Assessment - 06/14/15 1729    Subjective Pt stated " I have plateud, the pain is just not getting any better." Reported he went to see a chiropractor yesterday that diagnosed him with SI joint pain, reported relief from ultrasound performed by chiropractor    Currently in Pain? Yes   Pain Score 5    Pain Location Back   Pain Orientation Lower;Left   Pain Descriptors / Indicators Aching;Tightness   Pain Type Chronic pain   Pain Onset More than a month ago   Pain Frequency Intermittent   Aggravating Factors  sitting to long for work    Pain Relieving Factors ultrasound                          [facility] Adult PT Treatment/Exercise - 06/14/15 0001    Modalities   Modalities Ultrasound   Ultrasound   Ultrasound Location left low back   Ultrasound Parameters 1 MHz    Ultrasound Goals Pain   Manual Therapy   Manual Therapy Taping;Soft tissue mobilization;Myofascial release   Manual therapy comments deep pressure and SI distraction to left low back for trigger point release and  tension   [place];Inhibit Muscle  star technique over left low back                   PT Short Term Goals - 05/08/15 1749    PT SHORT TERM GOAL #1   Title independent with initial HEP   Status Achieved           PT Long Term Goals - 05/24/15 1735    PT LONG TERM GOAL #1   Title understand proper posture and body mechanics   Status Achieved   PT LONG TERM GOAL #2   Title increase Lumbar ROM to WFL's   Status Partially Met   PT LONG TERM GOAL #3   Title decrease pain 50%   Status Partially Met   PT LONG TERM GOAL #4   Title present with no lateral shift   Status Achieved               Plan - 06/14/15 1732    Clinical Impression Statement Pt presented with tightness and trigger points in left lower back, tender to touch. Tested for SI alignment, no obvious SI alightment noted. Attempted soft tissue mobilization, ultra sound, and kinesio taping to decrease inflamation and manage pain.    PT Next  Visit Plan See how he felt after kinesiotaping and ultrasound this visit, add some exercises for SI and lowerback mobility.    Recommended Other Services Pt         Problem List Patient Active Problem List   Diagnosis Date Noted  . LBP (low back pain) 11/02/2013  . Bilateral inguinal hernia (BIH), s/p lap repair 12/16/2013 11/02/2013    Crist Fat, SPTA 06/14/2015, 5:38 PM  [place] [address] [place], [place], [address] Phone: [phone]   Fax:  [phone]  Name: Jorge [name] MRN: [id number] Date of Birth: [birth year]

## 2015-06-20 ENCOUNTER — Encounter: Payer: Self-pay | Admitting: Physical Therapy

## 2015-06-20 ENCOUNTER — Ambulatory Visit: Payer: 59 | Admitting: Physical Therapy

## 2015-06-20 DIAGNOSIS — M629 Disorder of muscle, unspecified: Secondary | ICD-10-CM

## 2015-06-20 DIAGNOSIS — M5442 Lumbago with sciatica, left side: Secondary | ICD-10-CM | POA: Diagnosis not present

## 2015-06-20 NOTE — Therapy (Signed)
[facility] [address] [place], [place], [address] Phone: [phone]   Fax:  [phone]  Physical Therapy Treatment  Patient Details  Name: Jorge [name] MRN: [id number] Date of Birth: 02/22/1956 No Data Recorded  Encounter Date: 06/20/2015      PT End of Session - 06/20/15 1705    Visit Number 9   Date for PT Re-Evaluation 06/26/15   PT Start Time 1635   PT Stop Time 1700   PT Time Calculation (min) 25 min      History reviewed. No pertinent past medical history.  Past Surgical History  Procedure Laterality Date  . Rotator cuff repair  2003  . Colonoscopy  2008,2013  . Inguinal hernia repair  12/16/13    BIH by Dr. Steven Gross    There were no vitals filed for this visit.  Visit Diagnosis:  Left-sided low back pain with left-sided sciatica  Hamstring tightness of both lower extremities      Subjective Assessment - 06/20/15 1658    Subjective Pt stated he has been feeling much better since last visit. He went back to the chiropractor yesterday and they performed ultrasound, stretching, and did an adjustment. Reported that he has been performing self massage and stretching to low back, and reports "the knot is getting smaller." He has been able to perform light workouts at the gym without any complaints. Still reporting discomfort getting out the car.    Currently in Pain? Yes   Pain Score 1    Pain Location Back   Pain Orientation Left;Lower   Pain Descriptors / Indicators Aching;Sharp   Pain Type Chronic pain   Pain Onset More than a month ago   Pain Frequency Intermittent                         [facility] Adult PT Treatment/Exercise - 06/20/15 0001    Modalities   Modalities Iontophoresis;Ultrasound   Ultrasound   Ultrasound Location left low back    Ultrasound Parameters 1 MHz    Ultrasound Goals Pain   Iontophoresis   Type of Iontophoresis Dexamethasone   Location L lower  back    Dose 80 mA   Time 4 hours    Manual Therapy   Manual Therapy Passive ROM;Soft tissue mobilization   Manual therapy comments deep pressure and trigger point release    Passive ROM LE's flexibility                   PT Short Term Goals - 05/08/15 1749    PT SHORT TERM GOAL #1   Title independent with initial HEP   Status Achieved           PT Long Term Goals - 06/20/15 1708    PT LONG TERM GOAL #2   Title increase Lumbar ROM to WFL's   Status Partially Met   PT LONG TERM GOAL #3   Title decrease pain 50%   Status Achieved               Plan - 06/20/15 1705    Clinical Impression Statement Pt able to pin point pain in left lower back, attempted iontophoresis to see if it can decrease pain and inflamation. Pt presented with increased flexibility and less reports of pain with LE's PROM from therapist.  Pt reported he was unsure if kinesiotaping was effective, thought it may have decreased spasms.    PT   Next Visit Plan See how pt felt after iontophoresis. Work on lumbar , SI, and LE flexibility.         Problem List Patient Active Problem List   Diagnosis Date Noted  . LBP (low back pain) 11/02/2013  . Bilateral inguinal hernia (BIH), s/p lap repair 12/16/2013 11/02/2013    Maegan Tucker, SPTA 06/20/2015, 5:10 PM  [facility] [address] [place], [place], [address] Phone: [phone]   Fax:  [phone]  Name: Linkon Gargan MRN: [id number] Date of Birth: 02/07/1956     

## 2015-07-05 ENCOUNTER — Encounter: Payer: Self-pay | Admitting: Physical Therapy

## 2015-07-05 ENCOUNTER — Ambulatory Visit: Payer: 59 | Admitting: Physical Therapy

## 2015-07-05 DIAGNOSIS — M5442 Lumbago with sciatica, left side: Secondary | ICD-10-CM

## 2015-07-05 DIAGNOSIS — M629 Disorder of muscle, unspecified: Secondary | ICD-10-CM

## 2015-07-05 NOTE — Therapy (Signed)
[address] [place], [place], [address] Phone: [phone]   Fax:  [phone]  Physical Therapy Treatment  Patient Details  Name: Jorge [name] MRN: [id number] Date of Birth: 1955/07/30 No Data Recorded  Encounter Date: 07/05/2015      PT End of Session - 07/05/15 1734    Visit Number 10   Date for PT Re-Evaluation 07/27/15   PT Start Time 1702   PT Stop Time 1750   PT Time Calculation (min) 48 min   Activity Tolerance Patient tolerated treatment well   Behavior During Therapy [facility] for tasks assessed/performed      History reviewed. No pertinent past medical history.  Past Surgical History  Procedure Laterality Date  . Rotator cuff repair  2003  . Colonoscopy  [id number]  . Inguinal hernia repair  12/16/13    BIH by Dr. Maclovio Boston    There were no vitals filed for this visit.  Visit Diagnosis:  Left-sided low back pain with left-sided sciatica - Plan: PT plan of care cert/re-cert  Hamstring tightness of both lower extremities - Plan: PT plan of care cert/re-cert      Subjective Assessment - 07/05/15 1704    Subjective Patient had been doing great with activities, he reports that he feels he is getting stronger, just that after sitting long periods that he gets pain i the buttock and then into the back and in the leg, "feels like it is knotted up"   Currently in Pain? Yes   Pain Score 2    Pain Location Buttocks   Pain Orientation Left   Aggravating Factors  sitting   Pain Relieving Factors the treatment here has helped tremendously                         [facility] Adult PT Treatment/Exercise - 07/05/15 0001    Lumbar Exercises: Stretches   Passive Hamstring Stretch 30 seconds;3 reps   Quad Stretch 20 seconds;3 reps   ITB Stretch 30 seconds;3 reps   Piriformis Stretch 20 seconds;3 reps   Ultrasound   Ultrasound Location left lumbar buttock   Ultrasound Parameters 1MHz,  100%   Ultrasound Goals Pain   Manual Therapy   Manual Therapy Passive ROM;Soft tissue mobilization   Manual therapy comments deep pressure and trigger point release    Passive ROM LE's flexibility                   PT Short Term Goals - 05/08/15 1749    PT SHORT TERM GOAL #1   Title independent with initial HEP   Status Achieved           PT Long Term Goals - 07/05/15 1736    PT LONG TERM GOAL #1   Title understand proper posture and body mechanics   Status Achieved   PT LONG TERM GOAL #2   Title increase Lumbar ROM to WFL's   Status Partially Met   PT LONG TERM GOAL #3   Title decrease pain 50%   Status Achieved   PT LONG TERM GOAL #4   Title present with no lateral shift   Status Achieved               Plan - 07/05/15 1735    Clinical Impression Statement Patient has spasms and tenderness with trigger points in the gluteus medius origin along the left iliac crest area.  He has improved  tremendously since his start with [place] with less back pain, increase function and he no longer has a lateral shift and can work without much difficulty, still has this pain and spasm   PT Frequency 1x / week   PT Duration 4 weeks   PT Next Visit Plan See how pt felt after iontophoresis. Work on lumbar , SI, and LE flexibility.    Consulted and Agree with Plan of Care Patient        Problem List Patient Active Problem List   Diagnosis Date Noted  . LBP (low back pain) 11/02/2013  . Bilateral inguinal hernia (BIH), s/p lap repair 12/16/2013 11/02/2013    Sumner Boast., PT 07/05/2015, 5:38 PM  [place] [address] [place], [place], [address] Phone: [phone]   Fax:  [phone]  Name: Jorge [name] MRN: [id number] Date of Birth: [birth year]

## 2015-07-12 ENCOUNTER — Ambulatory Visit: Payer: 59 | Admitting: Physical Therapy

## 2015-07-12 ENCOUNTER — Encounter: Payer: Self-pay | Admitting: Physical Therapy

## 2015-07-12 DIAGNOSIS — M5442 Lumbago with sciatica, left side: Secondary | ICD-10-CM

## 2015-07-12 DIAGNOSIS — M629 Disorder of muscle, unspecified: Secondary | ICD-10-CM

## 2015-07-12 NOTE — Therapy (Signed)
[place] [address] [place], [place], [address] Phone: [phone]   Fax:  [phone]  Physical Therapy Treatment  Patient Details  Name: Jorge [name] MRN: [id number] Date of Birth: July 12, 1956 No Data Recorded  Encounter Date: 07/12/2015      PT End of Session - 07/12/15 1728    Visit Number 11   Date for PT Re-Evaluation 07/27/15   PT Start Time 1655   PT Stop Time 1740   PT Time Calculation (min) 45 min   Activity Tolerance Patient tolerated treatment well   Behavior During Therapy [facility] for tasks assessed/performed      History reviewed. No pertinent past medical history.  Past Surgical History  Procedure Laterality Date  . Rotator cuff repair  2003  . Colonoscopy  [id number]  . Inguinal hernia repair  12/16/13    BIH by Dr. Hermen Boston    There were no vitals filed for this visit.  Visit Diagnosis:  Left-sided low back pain with left-sided sciatica  Hamstring tightness of both lower extremities      Subjective Assessment - 07/12/15 1725    Subjective I was able to do more and lift more this past week at work, less problems, but still c/o a "bite" in the left lumbar area.   Currently in Pain? Yes   Pain Score 1    Pain Location Back   Pain Orientation Left;Lower   Pain Descriptors / Indicators Sharp;Jabbing   Aggravating Factors  sitting, lifting   Pain Relieving Factors the treatment                         [facility] Adult PT Treatment/Exercise - 07/12/15 0001    Moist Heat Therapy   Number Minutes Moist Heat 15 Minutes   Moist Heat Location Lumbar Spine   Electrical Stimulation   Electrical Stimulation Location left lumbar   Electrical Stimulation Action IFC   Electrical Stimulation Parameters supine   Electrical Stimulation Goals Pain   Ultrasound   Ultrasound Location left lumbar area   Ultrasound Parameters 1MHz   Ultrasound Goals Pain   Manual Therapy   Manual  Therapy Passive ROM;Soft tissue mobilization   Manual therapy comments deep pressure and trigger point release    Passive ROM LE's flexibility    Kinesiotex Create Space;Inhibit Muscle   Kinesiotix   Inhibit Muscle  left SI area and lumbar area                  PT Short Term Goals - 05/08/15 1749    PT SHORT TERM GOAL #1   Title independent with initial HEP   Status Achieved           PT Long Term Goals - 07/12/15 1732    PT LONG TERM GOAL #2   Title increase Lumbar ROM to WFL's   Status Achieved               Plan - 07/12/15 1729    Clinical Impression Statement Tried tape again to see if this helps.  He is doing well with job and has been able to return to the gym.  Has a knot in the left buttock and into the lumbar area that is tender and he feels that causes some of his pain.  Reports that he did not feel any difference with the iontophoresis   PT Next Visit Plan Continue with the flexibility, see if tape  helps   Consulted and Agree with Plan of Care Patient        Problem List Patient Active Problem List   Diagnosis Date Noted  . LBP (low back pain) 11/02/2013  . Bilateral inguinal hernia (BIH), s/p lap repair 12/16/2013 11/02/2013    Sumner Boast., PT 07/12/2015, 5:33 PM  [address] [place], [place], [address] Phone: [phone]   Fax:  [phone]  Name: Jorge [name] MRN: [id number] Date of Birth: [birth year]

## 2015-07-18 ENCOUNTER — Ambulatory Visit: Payer: 59 | Admitting: Physical Therapy

## 2015-07-23 ENCOUNTER — Ambulatory Visit: Payer: 59 | Attending: Sports Medicine | Admitting: Physical Therapy

## 2015-07-23 ENCOUNTER — Encounter: Payer: Self-pay | Admitting: Physical Therapy

## 2015-07-23 DIAGNOSIS — M629 Disorder of muscle, unspecified: Secondary | ICD-10-CM | POA: Diagnosis present

## 2015-07-23 DIAGNOSIS — M5442 Lumbago with sciatica, left side: Secondary | ICD-10-CM | POA: Diagnosis not present

## 2015-07-23 NOTE — Therapy (Signed)
[place] [address] [place], [place], [address] Phone: [phone]   Fax:  [phone]  Physical Therapy Treatment  Patient Details  Name: Jorge [name] MRN: [id number] Date of Birth: [birth year] No Data Recorded  Encounter Date: 07/23/2015      PT End of Session - 07/23/15 1457    Visit Number 12   Date for PT Re-Evaluation 07/27/15   PT Start Time 1400   PT Stop Time 1500   PT Time Calculation (min) 60 min   Activity Tolerance Patient tolerated treatment well   Behavior During Therapy [facility] for tasks assessed/performed      History reviewed. No pertinent past medical history.  Past Surgical History  Procedure Laterality Date  . Rotator cuff repair  2003  . Colonoscopy  [id number]  . Inguinal hernia repair  12/16/13    BIH by Dr. Jeovani Boston    There were no vitals filed for this visit.  Visit Diagnosis:  Left-sided low back pain with left-sided sciatica  Hamstring tightness of both lower extremities      Subjective Assessment - 07/23/15 1447    Subjective I think I am doing better, I have been doing some meditation and I think that is helping with stress.   Currently in Pain? Yes   Pain Score 2    Pain Location Back   Pain Orientation Left;Lower                         [facility] Adult PT Treatment/Exercise - 07/23/15 0001    Lumbar Exercises: Stretches   Passive Hamstring Stretch 30 seconds;3 reps   Quad Stretch 20 seconds;3 reps   ITB Stretch 30 seconds;3 reps   Piriformis Stretch 20 seconds;3 reps   Moist Heat Therapy   Number Minutes Moist Heat 15 Minutes   Moist Heat Location Lumbar Spine   Electrical Stimulation   Electrical Stimulation Location left lumbar   Electrical Stimulation Action IFC   Electrical Stimulation Parameters supine   Electrical Stimulation Goals Pain   Ultrasound   Ultrasound Location left lumbar   Ultrasound Parameters 1MHz 100%   Ultrasound Goals  Pain   Manual Therapy   Manual Therapy Passive ROM;Soft tissue mobilization   Manual therapy comments deep pressure and trigger point release    Passive ROM LE's flexibility    Kinesiotex Create Space;Inhibit Muscle   Kinesiotix   Inhibit Muscle  left SI area and lumbar area                  PT Short Term Goals - 05/08/15 1749    PT SHORT TERM GOAL #1   Title independent with initial HEP   Status Achieved           PT Long Term Goals - 07/12/15 1732    PT LONG TERM GOAL #2   Title increase Lumbar ROM to WFL's   Status Achieved               Plan - 07/23/15 1457    Clinical Impression Statement He is reporting that he is doing some meditation.  ROM is WNL's.  Still with knots that are tender in the left SI and lumbar area.   PT Next Visit Plan Continue with the flexibility, see if tape helps   Consulted and Agree with Plan of Care Patient        Problem List Patient Active Problem List   Diagnosis  Date Noted  . LBP (low back pain) 11/02/2013  . Bilateral inguinal hernia (BIH), s/p lap repair 12/16/2013 11/02/2013    Sumner Boast., PT  07/23/2015, 3:07 PM  [address] [place], [place], [address] Phone: [phone]   Fax:  [phone]  Name: Jorge [name] MRN: [id number] Date of Birth: 08-24-1955

## 2015-09-14 ENCOUNTER — Ambulatory Visit (HOSPITAL_BASED_OUTPATIENT_CLINIC_OR_DEPARTMENT_OTHER)
Admission: RE | Admit: 2015-09-14 | Discharge: 2015-09-14 | Disposition: A | Discharge status: Home / Self Care | Payer: 59 | Source: Ambulatory Visit | Attending: Family Medicine | Admitting: Family Medicine

## 2015-09-14 ENCOUNTER — Other Ambulatory Visit (HOSPITAL_BASED_OUTPATIENT_CLINIC_OR_DEPARTMENT_OTHER): Payer: Self-pay | Admitting: Family Medicine

## 2015-09-14 DIAGNOSIS — R609 Edema, unspecified: Secondary | ICD-10-CM

## 2015-09-14 DIAGNOSIS — R52 Pain, unspecified: Secondary | ICD-10-CM

## 2015-09-14 DIAGNOSIS — M79661 Pain in right lower leg: Secondary | ICD-10-CM | POA: Diagnosis present

## 2015-09-14 DIAGNOSIS — I8001 Phlebitis and thrombophlebitis of superficial vessels of right lower extremity: Secondary | ICD-10-CM | POA: Diagnosis not present

## 2016-01-12 HISTORY — PX: VERTEBROPLASTY: SHX113

## 2016-01-14 ENCOUNTER — Other Ambulatory Visit: Payer: Self-pay | Admitting: Sports Medicine

## 2016-01-14 DIAGNOSIS — M4854XA Collapsed vertebra, not elsewhere classified, thoracic region, initial encounter for fracture: Secondary | ICD-10-CM

## 2016-01-16 ENCOUNTER — Other Ambulatory Visit (HOSPITAL_COMMUNITY): Payer: Self-pay | Admitting: Sports Medicine

## 2016-01-16 ENCOUNTER — Telehealth (HOSPITAL_COMMUNITY): Payer: Self-pay | Admitting: Interventional Radiology

## 2016-01-16 ENCOUNTER — Ambulatory Visit
Admission: RE | Admit: 2016-01-16 | Discharge: 2016-01-16 | Disposition: A | Discharge status: Home / Self Care | Payer: 59 | Source: Ambulatory Visit | Attending: Sports Medicine | Admitting: Sports Medicine

## 2016-01-16 DIAGNOSIS — M549 Dorsalgia, unspecified: Secondary | ICD-10-CM

## 2016-01-16 DIAGNOSIS — IMO0002 Reserved for concepts with insufficient information to code with codable children: Secondary | ICD-10-CM

## 2016-01-16 DIAGNOSIS — M4854XA Collapsed vertebra, not elsewhere classified, thoracic region, initial encounter for fracture: Secondary | ICD-10-CM

## 2016-01-16 NOTE — Telephone Encounter (Signed)
Called pt, left VM for him to call to schedule new pt consult with D. JM

## 2016-01-18 ENCOUNTER — Other Ambulatory Visit: Payer: 59

## 2016-01-22 ENCOUNTER — Ambulatory Visit (HOSPITAL_COMMUNITY)
Admission: RE | Admit: 2016-01-22 | Discharge: 2016-01-22 | Disposition: A | Discharge status: Home / Self Care | Payer: 59 | Source: Ambulatory Visit | Attending: Sports Medicine | Admitting: Sports Medicine

## 2016-01-22 ENCOUNTER — Other Ambulatory Visit (HOSPITAL_COMMUNITY): Payer: Self-pay | Admitting: Interventional Radiology

## 2016-01-22 DIAGNOSIS — IMO0002 Reserved for concepts with insufficient information to code with codable children: Secondary | ICD-10-CM

## 2016-01-22 DIAGNOSIS — M549 Dorsalgia, unspecified: Secondary | ICD-10-CM

## 2016-01-23 ENCOUNTER — Other Ambulatory Visit: Payer: Self-pay | Admitting: General Surgery

## 2016-01-24 ENCOUNTER — Other Ambulatory Visit: Payer: Self-pay | Admitting: Radiology

## 2016-01-25 ENCOUNTER — Other Ambulatory Visit (HOSPITAL_COMMUNITY): Payer: Self-pay | Admitting: Interventional Radiology

## 2016-01-25 ENCOUNTER — Encounter (HOSPITAL_COMMUNITY): Payer: Self-pay

## 2016-01-25 ENCOUNTER — Ambulatory Visit (HOSPITAL_COMMUNITY)
Admission: RE | Admit: 2016-01-25 | Discharge: 2016-01-25 | Disposition: A | Discharge status: Home / Self Care | Payer: 59 | Source: Ambulatory Visit | Attending: Interventional Radiology | Admitting: Interventional Radiology

## 2016-01-25 DIAGNOSIS — Z87891 Personal history of nicotine dependence: Secondary | ICD-10-CM | POA: Diagnosis not present

## 2016-01-25 DIAGNOSIS — F419 Anxiety disorder, unspecified: Secondary | ICD-10-CM | POA: Insufficient documentation

## 2016-01-25 DIAGNOSIS — M8448XA Pathological fracture, other site, initial encounter for fracture: Secondary | ICD-10-CM | POA: Insufficient documentation

## 2016-01-25 DIAGNOSIS — IMO0002 Reserved for concepts with insufficient information to code with codable children: Secondary | ICD-10-CM

## 2016-01-25 DIAGNOSIS — Z8249 Family history of ischemic heart disease and other diseases of the circulatory system: Secondary | ICD-10-CM | POA: Diagnosis not present

## 2016-01-25 DIAGNOSIS — M549 Dorsalgia, unspecified: Secondary | ICD-10-CM

## 2016-01-25 HISTORY — DX: Anxiety disorder, unspecified: F41.9

## 2016-01-25 LAB — BASIC METABOLIC PANEL
ANION GAP: 8 (ref 5–15)
BUN: 12 mg/dL (ref 6–20)
CALCIUM: 10.1 mg/dL (ref 8.9–10.3)
CO2: 24 mmol/L (ref 22–32)
Chloride: 101 mmol/L (ref 101–111)
Creatinine, Ser: 0.84 mg/dL (ref 0.61–1.24)
Glucose, Bld: 106 mg/dL — ABNORMAL HIGH (ref 65–99)
Potassium: 4 mmol/L (ref 3.5–5.1)
Sodium: 133 mmol/L — ABNORMAL LOW (ref 135–145)

## 2016-01-25 LAB — CBC
HCT: 35.1 % — ABNORMAL LOW (ref 39.0–52.0)
HEMOGLOBIN: 11.9 g/dL — AB (ref 13.0–17.0)
MCH: 34.2 pg — ABNORMAL HIGH (ref 26.0–34.0)
MCHC: 33.9 g/dL (ref 30.0–36.0)
MCV: 100.9 fL — ABNORMAL HIGH (ref 78.0–100.0)
Platelets: 206 10*3/uL (ref 150–400)
RBC: 3.48 MIL/uL — AB (ref 4.22–5.81)
RDW: 13.3 % (ref 11.5–15.5)
WBC: 5.2 10*3/uL (ref 4.0–10.5)

## 2016-01-25 LAB — APTT: APTT: 27 s (ref 24–37)

## 2016-01-25 LAB — PROTIME-INR
INR: 1.19 (ref 0.00–1.49)
PROTHROMBIN TIME: 15.3 s — AB (ref 11.6–15.2)

## 2016-01-25 MED ORDER — MIDAZOLAM HCL 2 MG/2ML IJ SOLN
INTRAMUSCULAR | Status: AC | PRN
  Administered 2016-01-25 (×2): 1 mg via INTRAVENOUS

## 2016-01-25 MED ORDER — IOPAMIDOL (ISOVUE-300) INJECTION 61%
INTRAVENOUS | Status: AC
  Administered 2016-01-25: 4 mL
  Filled 2016-01-25: qty 50

## 2016-01-25 MED ORDER — CEFAZOLIN SODIUM-DEXTROSE 2-4 GM/100ML-% IV SOLN
2.0000 g | INTRAVENOUS | Status: AC
  Administered 2016-01-25: 2 g via INTRAVENOUS

## 2016-01-25 MED ORDER — CEFAZOLIN SODIUM-DEXTROSE 2-4 GM/100ML-% IV SOLN
INTRAVENOUS | Status: AC
  Filled 2016-01-25: qty 100

## 2016-01-25 MED ORDER — FENTANYL CITRATE (PF) 100 MCG/2ML IJ SOLN
INTRAMUSCULAR | Status: AC | PRN
  Administered 2016-01-25 (×2): 25 ug via INTRAVENOUS

## 2016-01-25 MED ORDER — SODIUM CHLORIDE 0.9 % IV SOLN
INTRAVENOUS | Status: AC | PRN
  Administered 2016-01-25: 10 mL/h via INTRAVENOUS

## 2016-01-25 MED ORDER — HYDROMORPHONE HCL 1 MG/ML IJ SOLN
INTRAMUSCULAR | Status: AC | PRN
  Administered 2016-01-25: 1 mg via INTRAVENOUS

## 2016-01-25 MED ORDER — MIDAZOLAM HCL 2 MG/2ML IJ SOLN
INTRAMUSCULAR | Status: AC
  Filled 2016-01-25: qty 4

## 2016-01-25 MED ORDER — TOBRAMYCIN SULFATE 1.2 G IJ SOLR
INTRAMUSCULAR | Status: AC
  Administered 2016-01-25: 1.2 g
  Filled 2016-01-25: qty 1.2

## 2016-01-25 MED ORDER — SODIUM CHLORIDE 0.9 % IV SOLN: INTRAVENOUS | Status: AC

## 2016-01-25 MED ORDER — HYDROMORPHONE HCL 1 MG/ML IJ SOLN
INTRAMUSCULAR | Status: AC
  Filled 2016-01-25: qty 2

## 2016-01-25 MED ORDER — SODIUM CHLORIDE 0.9 % IV SOLN: INTRAVENOUS | Status: DC

## 2016-01-25 MED ORDER — FENTANYL CITRATE (PF) 100 MCG/2ML IJ SOLN
INTRAMUSCULAR | Status: AC
  Filled 2016-01-25: qty 4

## 2016-01-25 MED ORDER — BUPIVACAINE HCL (PF) 0.25 % IJ SOLN
INTRAMUSCULAR | Status: AC
  Administered 2016-01-25: 20 mL
  Filled 2016-01-25: qty 30

## 2016-01-25 NOTE — Discharge Instructions (Signed)
1.No stooping,bending orlifting more than 10 lbs for 2 weeks. °2.Use walker to ambulate  For 2 weeks. °3.RTC in 2 weeks ° ° °KYPHOPLASTY/VERTEBROPLASTY DISCHARGE INSTRUCTIONS ° °Medications: (check all that apply) ° °   Resume all home medications as before procedure. °   °   ° °Continue your pain medications as prescribed as needed.  Over the next 3-5 days, decrease your pain medication as tolerated.  Over the counter medications (i.e. Tylenol, ibuprofen, and aleve) may be substituted once severe/moderate pain symptoms have subsided. ° ° Wound Care: °- Bandages may be removed the day following your procedure.  You may get your incision wet once bandages are removed.  Bandaids may be used to cover the incisions until scab formation.  Topical ointments are optional. ° °- If you develop a fever greater than 101 degrees, have increased skin redness at the incision sites or pus-like oozing from incisions occurring within 1 week of the procedure, contact radiology at [phone] or [phone]. ° °- Ice pack to back for 15-20 minutes 2-3 time per day for first 2-3 days post procedure.  The ice will expedite muscle healing and help with the pain from the incisions. ° ° Activity: °- Bedrest today with limited activity for 24 hours post procedure. ° °- No driving for 48 hours. ° °- Increase your activity as tolerated after bedrest (with assistance if necessary). ° °- Refrain from any strenuous activity or heavy lifting (greater than 10 lbs.). ° ° Follow up: °- Contact radiology at [phone] or [phone] if any questions/concerns. ° °- A physician assistant from radiology will contact you in approximately 1 week. ° °- If a biopsy was performed at the time of your procedure, your referring physician should receive the results in usually 2-3 days. ° ° ° ° ° ° ° ° °

## 2016-01-25 NOTE — Procedures (Signed)
S/P T 9 VP with biopsy

## 2016-01-25 NOTE — H&P (Signed)
Chief Complaint: Patient was seen in consultation today for Thoracic 9 vertebroplasty/kyphoplasty at the request of Dr Tye Maryland  Referring Physician(s): Dr Tye [place]  Supervising Physician: Luanne Bras  Patient Status: Outpatient  History of Present Illness: Jorge [name] is a 60 y.o. male   Pt was lifting heavy equipment and twisted with it approx 5-6 weeks ago Onset back pain and worse next day Xrays neg MD Rx Norco-- minimal help Prednisone- no help Still significant pain with sitting prolonged Raising up from chair or bed MRI outside facility revealed Thoracic 9 acute fracture Consulted with Dr Estanislado Pandy Now scheduled for T9 VP/KP  Past Medical History  Diagnosis Date  . Anxiety     Past Surgical History  Procedure Laterality Date  . Rotator cuff repair  2003  . Colonoscopy  [id number]  . Inguinal hernia repair  12/16/13    BIH by Dr. Traver Boston    Allergies: Review of patient's allergies indicates no known allergies.  Medications: Prior to Admission medications   Medication Sig Start Date End Date Taking? Authorizing Provider  calcium carbonate (OS-CAL - DOSED IN MG OF ELEMENTAL CALCIUM) 1250 (500 Ca) MG tablet Take 1 tablet by mouth 3 (three) times daily with meals.    Yes Historical Provider, MD  carisoprodol (SOMA) 350 MG tablet Take 350 mg by mouth 3 (three) times daily as needed for muscle spasms.  01/19/16  Yes Historical Provider, MD  HYDROcodone-acetaminophen (NORCO) 10-325 MG tablet Take 0.5-1 tablets by mouth every 8 (eight) hours as needed for moderate pain.  01/08/16  Yes Historical Provider, MD  ibuprofen (ADVIL,MOTRIN) 400 MG tablet Take 400 mg by mouth every 6 (six) hours as needed for mild pain.    Yes Historical Provider, MD  sertraline (ZOLOFT) 100 MG tablet Take 100 mg by mouth daily. 01/02/16  Yes Historical Provider, MD  VITAMIN D-VITAMIN K PO Take 10,000 Units by mouth daily.   Yes Historical Provider, MD  zolpidem (AMBIEN) 10 MG  tablet Take 5-10 mg by mouth at bedtime as needed for sleep.  01/03/16  Yes Historical Provider, MD     Family History  Problem Relation Age of Onset  . Cancer Mother     ovarian and colon  . Heart disease Father     Social History   Social History  . Marital Status: Married    Spouse Name: N/A  . Number of Children: N/A  . Years of Education: N/A   Social History Main Topics  . Smoking status: Former Smoker    Quit date: 11/02/1980  . Smokeless tobacco: None  . Alcohol Use: Yes     Comment: 1-2 drinks/day  . Drug Use: No  . Sexual Activity: Not Asked   Other Topics Concern  . None   Social History Narrative     Review of Systems: A 12 point ROS discussed and pertinent positives are indicated in the HPI above.  All other systems are negative.  Review of Systems  Constitutional: Positive for activity change. Negative for fever, appetite change and fatigue.  Respiratory: Negative for shortness of breath.   Gastrointestinal: Negative for abdominal pain.  Musculoskeletal: Positive for back pain.  Neurological: Negative for weakness.  Psychiatric/Behavioral: Negative for behavioral problems and confusion.    Vital Signs: BP 125/79 mmHg  Pulse 84  Temp(Src) 98.1 F (36.7 C) (Oral)  Resp 18  Ht 5\' 11"  (1.803 m)  Wt 185 lb (83.915 kg)  BMI 25.81 kg/m2  SpO2 92%  Physical  Exam  Constitutional: He is oriented to person, place, and time. He appears well-nourished.  Cardiovascular: Normal rate and regular rhythm.   Pulmonary/Chest: Effort normal and breath sounds normal. He has no wheezes.  Abdominal: Soft. Bowel sounds are normal. There is no tenderness.  Musculoskeletal: Normal range of motion. He exhibits tenderness.  Mid back pain  Neurological: He is alert and oriented to person, place, and time.  Skin: Skin is warm and dry.  Psychiatric: He has a normal mood and affect. His behavior is normal. Judgment and thought content normal.  Nursing note and vitals  reviewed.   Mallampati Score:  MD Evaluation Airway: WNL Heart: WNL Abdomen: WNL Chest/ Lungs: WNL ASA  Classification: 2 Mallampati/Airway Score: One  Imaging: No results found.  Labs:  CBC:  Recent Labs  01/25/16 0640  WBC 5.2  HGB 11.9*  HCT 35.1*  PLT 206    COAGS: No results for input(s): INR, APTT in the last 8760 hours.  BMP: No results for input(s): NA, K, CL, CO2, GLUCOSE, BUN, CALCIUM, CREATININE, GFRNONAA, GFRAA in the last 8760 hours.  Invalid input(s): CMP  LIVER FUNCTION TESTS: No results for input(s): BILITOT, AST, ALT, ALKPHOS, PROT, ALBUMIN in the last 8760 hours.  TUMOR MARKERS: No results for input(s): AFPTM, CEA, CA199, CHROMGRNA in the last 8760 hours.  Assessment and Plan:  Painful T9 fracture Scheduled for VP/KP Risks and Benefits discussed with the patient including, but not limited to education regarding the natural healing process of compression fractures without intervention, bleeding, infection, cement migration which may cause spinal cord damage, paralysis, pulmonary embolism or even death. All of the patient's questions were answered, patient is agreeable to proceed. Consent signed and in chart.   Thank you for this interesting consult.  I greatly enjoyed meeting Ahmet Butzer and look forward to participating in their care.  A copy of this report was sent to the requesting provider on this date.  Electronically Signed: Monia Sabal A 01/25/2016, 7:23 AM   I spent a total of  30 Minutes   in face to face in clinical consultation, greater than 50% of which was counseling/coordinating care for T9 VP/KP

## 2016-01-27 ENCOUNTER — Telehealth: Payer: Self-pay | Admitting: Interventional Radiology

## 2016-01-27 NOTE — Telephone Encounter (Signed)
Pt called today stating that he has developing mild left sided ankle swelling following T9 VP performed by my partner, Dr. Estanislado Pandy on 7/14.  The pt denies recent injury, though the pt states he has had intermittent swelling in the left ankle before secondary to a remote ankle injury.    The swelling is just limited to his left ankle.  No associated pain, SOB or chest pain.   The pt is otherwise without complaint and states that his back pain has improved since the VP (pre-procedure at 9-10/10, currently at 4-5/10).  Recommendations: - I have elected to treat this conservatively at this time recommending the pt wear a knee high stocking, increase his oral fluid intake and activity, especially walking.  - I have instructed the pt to call or go to the emergency department if he notices worsening swelling or pain, or develops CP or SOB as these can be signs of DVT and PE.  The pt and the pt's wife demonstrated excellent understanding of this discussion.  Ronny Bacon, MD Pager #: [phone]

## 2016-01-28 ENCOUNTER — Encounter: Payer: Self-pay | Admitting: Hematology & Oncology

## 2016-01-29 ENCOUNTER — Other Ambulatory Visit (HOSPITAL_COMMUNITY): Payer: Self-pay | Admitting: Interventional Radiology

## 2016-01-29 DIAGNOSIS — IMO0002 Reserved for concepts with insufficient information to code with codable children: Secondary | ICD-10-CM

## 2016-02-08 ENCOUNTER — Ambulatory Visit (HOSPITAL_BASED_OUTPATIENT_CLINIC_OR_DEPARTMENT_OTHER)
Admission: RE | Admit: 2016-02-08 | Discharge: 2016-02-08 | Disposition: A | Discharge status: Home / Self Care | Payer: 59 | Source: Ambulatory Visit | Attending: Hematology & Oncology | Admitting: Hematology & Oncology

## 2016-02-08 ENCOUNTER — Ambulatory Visit: Payer: 59

## 2016-02-08 ENCOUNTER — Other Ambulatory Visit (HOSPITAL_BASED_OUTPATIENT_CLINIC_OR_DEPARTMENT_OTHER): Payer: 59

## 2016-02-08 ENCOUNTER — Ambulatory Visit (HOSPITAL_BASED_OUTPATIENT_CLINIC_OR_DEPARTMENT_OTHER): Payer: 59 | Admitting: Hematology & Oncology

## 2016-02-08 ENCOUNTER — Encounter: Payer: Self-pay | Admitting: Hematology & Oncology

## 2016-02-08 DIAGNOSIS — C9000 Multiple myeloma not having achieved remission: Secondary | ICD-10-CM | POA: Insufficient documentation

## 2016-02-08 DIAGNOSIS — D472 Monoclonal gammopathy: Secondary | ICD-10-CM | POA: Diagnosis not present

## 2016-02-08 DIAGNOSIS — M4854XA Collapsed vertebra, not elsewhere classified, thoracic region, initial encounter for fracture: Secondary | ICD-10-CM | POA: Diagnosis not present

## 2016-02-08 LAB — CMP (CANCER CENTER ONLY)
ALBUMIN: 3.6 g/dL (ref 3.3–5.5)
ALK PHOS: 83 U/L (ref 26–84)
ALT: 34 U/L (ref 10–47)
AST: 25 U/L (ref 11–38)
BILIRUBIN TOTAL: 0.7 mg/dL (ref 0.20–1.60)
BUN, Bld: 13 mg/dL (ref 7–22)
CO2: 27 mEq/L (ref 18–33)
CREATININE: 1.1 mg/dL (ref 0.6–1.2)
Calcium: 9.7 mg/dL (ref 8.0–10.3)
Chloride: 102 mEq/L (ref 98–108)
Glucose, Bld: 88 mg/dL (ref 73–118)
Potassium: 4 mEq/L (ref 3.3–4.7)
SODIUM: 129 meq/L (ref 128–145)
TOTAL PROTEIN: 9.5 g/dL — AB (ref 6.4–8.1)

## 2016-02-08 LAB — CBC WITH DIFFERENTIAL (CANCER CENTER ONLY)
BASO#: 0 10*3/uL (ref 0.0–0.2)
BASO%: 0.3 % (ref 0.0–2.0)
EOS ABS: 0.1 10*3/uL (ref 0.0–0.5)
EOS%: 2 % (ref 0.0–7.0)
HCT: 35.9 % — ABNORMAL LOW (ref 38.7–49.9)
HEMOGLOBIN: 12.2 g/dL — AB (ref 13.0–17.1)
LYMPH#: 2.6 10*3/uL (ref 0.9–3.3)
LYMPH%: 44.1 % (ref 14.0–48.0)
MCH: 34.8 pg — ABNORMAL HIGH (ref 28.0–33.4)
MCHC: 34 g/dL (ref 32.0–35.9)
MCV: 102 fL — ABNORMAL HIGH (ref 82–98)
MONO#: 0.5 10*3/uL (ref 0.1–0.9)
MONO%: 7.6 % (ref 0.0–13.0)
NEUT%: 46 % (ref 40.0–80.0)
NEUTROS ABS: 2.7 10*3/uL (ref 1.5–6.5)
Platelets: 256 10*3/uL (ref 145–400)
RBC: 3.51 10*6/uL — AB (ref 4.20–5.70)
RDW: 13.2 % (ref 11.1–15.7)
WBC: 5.9 10*3/uL (ref 4.0–10.0)

## 2016-02-08 LAB — TECHNOLOGIST REVIEW CHCC SATELLITE

## 2016-02-08 MED ORDER — FENTANYL 25 MCG/HR TD PT72: 25.0000 ug | MEDICATED_PATCH | TRANSDERMAL | 0 refills | Status: DC

## 2016-02-08 NOTE — Progress Notes (Signed)
Referral MD  Reason for Referral: IgG myeloma  Chief Complaint  Patient presents with  . Follow-up  : My back was hurting me.  HPI: Jorge [name] is a very nice 60 year old white male. He is originally from [place]. 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 This back pain just persisted. He ultimately had a MRI done on June 27. This showed a T9 compression fracture with 65% height loss. There was minimal retropulsion. There was no other at [facility] that were noted.  He then underwent a kyphoplasty. This was done on July 14. Some specimens were taken. The pathology report ([id number]) showed normal bone elements.  He then had lab work done. He was found have a monoclonal spike. This was found on July 13. He had a M spike that was 3.7 g/dL. His IgG level was 4947 mg/dL.  He then was kindly referred to the [place] for an evaluation.  He still is having significant back discomfort. Is mostly in the mid back about the right shoulder blade. There is some radiation across his back.  He's had no fever. He's had no rashes. He's had no leg swelling. He's had no weight loss or weight gain. He appears to be very athletic guy.  He's had no change in bowel or bladder habits. There's been no incontinence.  He does not smoke. He really does not drink.  There is a history of uterine cancer in the family.  He's had no headache. He's had no visual changes.  Overall, his performance status is ECOG 1.    Past Medical History:  Diagnosis Date  . Anxiety   :  Past Surgical History:  Procedure Laterality Date  . COLONOSCOPY  [id number]  . INGUINAL HERNIA REPAIR  12/16/13   BIH by Dr. Kunaal Boston  . ROTATOR CUFF REPAIR  2003  :   Current Outpatient Prescriptions:  .  tiZANidine (ZANAFLEX) 4 MG capsule, Take 4 mg by mouth 3 (three) times daily as needed for muscle spasms., Disp: , Rfl:  .  calcium carbonate  (OS-CAL - DOSED IN MG OF ELEMENTAL CALCIUM) 1250 (500 Ca) MG tablet, Take 1 tablet by mouth 3 (three) times daily with meals. , Disp: , Rfl:  .  fentaNYL (DURAGESIC - DOSED MCG/HR) 25 MCG/HR patch, Place 1 patch (25 mcg total) onto the skin every 3 (three) days., Disp: 10 patch, Rfl: 0 .  HYDROcodone-acetaminophen (NORCO) 10-325 MG tablet, Take 0.5-1 tablets by mouth every 8 (eight) hours as needed for moderate pain. , Disp: , Rfl: 0 .  ibuprofen (ADVIL,MOTRIN) 400 MG tablet, Take 400 mg by mouth every 6 (six) hours as needed for mild pain. , Disp: , Rfl:  .  sertraline (ZOLOFT) 100 MG tablet, Take 100 mg by mouth daily., Disp: , Rfl: 2 .  VITAMIN D-VITAMIN K PO, Take 10,000 Units by mouth daily., Disp: , Rfl:  .  zolpidem (AMBIEN) 10 MG tablet, Take 5-10 mg by mouth at bedtime as needed for sleep. , Disp: , Rfl: 3:  :  No Known Allergies:  Family History  Problem Relation Age of Onset  . Cancer Mother     ovarian and colon  . Heart disease Father   :  Social History   Social History  . Marital status: Married    Spouse name: N/A  . Number of children: N/A  . Years of education: N/A   Occupational History  .  Not on file.   Social History Main Topics  . Smoking status: Former Smoker    Quit date: 11/02/1980  . Smokeless tobacco: Never Used  . Alcohol use Yes     Comment: 1-2 drinks/day  . Drug use: No  . Sexual activity: Not on file   Other Topics Concern  . Not on file   Social History Narrative  . No narrative on file  :  Pertinent items are noted in HPI.  Exam: '@IPVITALS' @  well-developed and well-nourished white male in no obvious distress. Vital signs are temperature of 97.9. Pulse 72. Blood pressure 132/78. Weight is 184 pounds. Head and neck exam shows no ocular or oral lesions. He has no palpable cervical or supraclavicular lymph nodes. He has no palpable thyroid. Lungs are clear bilaterally. Cardiac exam regular rate and rhythm with no murmurs, rubs or bruits.  Abdomen is soft. He has good bowel sounds. There is no fluid wave. There is no palpable liver or spleen tip. Back exam shows some tenderness in the midthoracic spine. There is some tenderness along the right thoracic paravertebral muscles. Extremities shows no clubbing, cyanosis or edema. He has good range of motion of his joints. He has good strength in his extremities. Neurological exam shows no focal neurological deficits. Skin exam shows no rashes, ecchymoses or petechia.    Recent Labs  02/08/16 1420  WBC 5.9  HGB 12.2*  HCT 35.9*  PLT 256    Recent Labs  02/08/16 1420  NA 129  K 4.0  CL 102  CO2 27  GLUCOSE 88  BUN 13  CREATININE 1.1  CALCIUM 9.7    Blood smear review:  None  Pathology: None     Assessment and Plan:  Jorge [name] is a very nice 60 year old white male. I think he clearly has myeloma. He has a significant M spike of 3.7 g/dL. His IgG level is quite elevated along with a depressed IgM and IgA level.  The compression fracture certainly is concerning. He had a kyphoplasty and I would think that he would've felt better.  I think we had to do a complete staging evaluation. He will get a bone survey today. I think a PET scan also will be quite helpful.  If there is significant activity at T9, I think he might need radiation therapy to try to help with some of the discomfort.  A 24 urine will be done next week.  I will do a bone marrow biopsy on him on August 1. I'll make sure that the biopsies sent off for flow cytometry, cytogenetics, and FISH.  I talked venous wife about the likely diagnosis. Ultimately, I think he will benefit from a stem cell transplant. He is in great shape.  I suspect we probably will treat him with Revlimid/Velcade/Decadron when the time comes.  I did give him a Duragesic patch. Ultimately this may help with some of the discomfort.  I spent about an hour with he and his wife. I answered all their questions. Again I told him what  the likely diagnosis was. I feel we have very good chance at getting him into remission and keep him in remission for long-term.  Hopefully, we can get his pain better so he can have a little more of a functional life.  I will plan to get him back at we get all the results back from our studies. I will like to try to get him started on treatment the week of August 7.

## 2016-02-09 LAB — BETA 2 MICROGLOBULIN, SERUM: BETA 2: 3.1 mg/L — AB (ref 0.6–2.4)

## 2016-02-09 LAB — IGG, IGA, IGM
IgA, Qn, Serum: 25 mg/dL — ABNORMAL LOW (ref 90–386)
IgG, Qn, Serum: 4563 mg/dL — ABNORMAL HIGH (ref 700–1600)
IgM, Qn, Serum: 18 mg/dL — ABNORMAL LOW (ref 20–172)

## 2016-02-11 ENCOUNTER — Ambulatory Visit (HOSPITAL_COMMUNITY)
Admission: RE | Admit: 2016-02-11 | Discharge: 2016-02-11 | Disposition: A | Discharge status: Home / Self Care | Payer: 59 | Source: Ambulatory Visit | Attending: Interventional Radiology | Admitting: Interventional Radiology

## 2016-02-11 ENCOUNTER — Other Ambulatory Visit: Payer: Self-pay | Admitting: Hematology & Oncology

## 2016-02-11 ENCOUNTER — Ambulatory Visit: Payer: 59

## 2016-02-11 DIAGNOSIS — IMO0002 Reserved for concepts with insufficient information to code with codable children: Secondary | ICD-10-CM

## 2016-02-11 DIAGNOSIS — C9000 Multiple myeloma not having achieved remission: Secondary | ICD-10-CM

## 2016-02-11 HISTORY — PX: IR GENERIC HISTORICAL: IMG1180011

## 2016-02-11 LAB — KAPPA/LAMBDA LIGHT CHAINS
IG KAPPA FREE LIGHT CHAIN: 22.7 mg/L — AB (ref 3.3–19.4)
Ig Lambda Free Light Chain: 3.3 mg/L — ABNORMAL LOW (ref 5.7–26.3)
Kappa/Lambda FluidC Ratio: 6.88 — ABNORMAL HIGH (ref 0.26–1.65)

## 2016-02-11 LAB — LACTATE DEHYDROGENASE: LDH: 272 U/L — AB (ref 125–245)

## 2016-02-12 ENCOUNTER — Ambulatory Visit (HOSPITAL_COMMUNITY)
Admission: RE | Admit: 2016-02-12 | Discharge: 2016-02-12 | Disposition: A | Discharge status: Home / Self Care | Payer: 59 | Source: Ambulatory Visit | Attending: Hematology & Oncology | Admitting: Hematology & Oncology

## 2016-02-12 ENCOUNTER — Encounter (HOSPITAL_COMMUNITY): Payer: Self-pay

## 2016-02-12 DIAGNOSIS — D539 Nutritional anemia, unspecified: Secondary | ICD-10-CM | POA: Insufficient documentation

## 2016-02-12 DIAGNOSIS — C9000 Multiple myeloma not having achieved remission: Secondary | ICD-10-CM | POA: Diagnosis not present

## 2016-02-12 LAB — CBC WITH DIFFERENTIAL/PLATELET
BASOS ABS: 0 10*3/uL (ref 0.0–0.1)
BASOS PCT: 1 %
EOS PCT: 3 %
Eosinophils Absolute: 0.2 10*3/uL (ref 0.0–0.7)
HCT: 33.8 % — ABNORMAL LOW (ref 39.0–52.0)
Hemoglobin: 11.2 g/dL — ABNORMAL LOW (ref 13.0–17.0)
Lymphocytes Relative: 44 %
Lymphs Abs: 2.2 10*3/uL (ref 0.7–4.0)
MCH: 33.8 pg (ref 26.0–34.0)
MCHC: 33.1 g/dL (ref 30.0–36.0)
MCV: 102.1 fL — ABNORMAL HIGH (ref 78.0–100.0)
MONO ABS: 0.4 10*3/uL (ref 0.1–1.0)
MONOS PCT: 9 %
Neutro Abs: 2.1 10*3/uL (ref 1.7–7.7)
Neutrophils Relative %: 43 %
PLATELETS: 270 10*3/uL (ref 150–400)
RBC: 3.31 MIL/uL — ABNORMAL LOW (ref 4.22–5.81)
RDW: 14.1 % (ref 11.5–15.5)
WBC: 4.9 10*3/uL (ref 4.0–10.5)

## 2016-02-12 LAB — PROTEIN ELECTROPHORESIS, SERUM, WITH REFLEX
A/G RATIO SPE: 0.6 — AB (ref 0.7–1.7)
ALBUMIN: 3.9 g/dL (ref 2.9–4.4)
Alpha 1: 0.4 g/dL (ref 0.0–0.4)
Alpha 2: 0.8 g/dL (ref 0.4–1.0)
BETA: 1.1 g/dL (ref 0.7–1.3)
GLOBULIN, TOTAL: 6 g/dL — AB (ref 2.2–3.9)
Gamma Globulin: 3.7 g/dL — ABNORMAL HIGH (ref 0.4–1.8)
INTERPRETATION(SEE BELOW): 0
M-Spike, %: 3.6 g/dL — ABNORMAL HIGH
TOTAL PROTEIN: 9.9 g/dL — AB (ref 6.0–8.5)

## 2016-02-12 LAB — BONE MARROW EXAM

## 2016-02-12 MED ORDER — SODIUM CHLORIDE 0.9 % IV SOLN
Freq: Once | INTRAVENOUS | Status: AC
  Administered 2016-02-12: 08:00:00 via INTRAVENOUS

## 2016-02-12 MED ORDER — MEPERIDINE HCL 50 MG/ML IJ SOLN
50.0000 mg | Freq: Once | INTRAMUSCULAR | Status: AC
  Administered 2016-02-12 (×2): 25 mg via INTRAVENOUS
  Filled 2016-02-12: qty 1

## 2016-02-12 MED ORDER — MIDAZOLAM HCL 5 MG/ML IJ SOLN
10.0000 mg | Freq: Once | INTRAMUSCULAR | Status: AC
  Administered 2016-02-12: 1 mg via INTRAVENOUS
  Administered 2016-02-12 (×2): 2 mg via INTRAVENOUS
  Filled 2016-02-12: qty 2

## 2016-02-12 NOTE — Procedures (Signed)
This is a bone marrow biopsy and aspirate procedure note for Jorge [name].  He was brought to the short stay unit. He had an IV placed peripherally.  His Mallimpati score is 1. His ASA class is 1.  We did the appropriate timeout procedure at 7:50 AM.  We then placed him onto his right side. He received a total of 5 mg of Versed and 50 mg of Demerol for IV sedation.  The left posterior iliac crest was prepped and draped in sterile fashion. 5 mL of 1% lidocaine was we'll check under the skin down to the periosteum. A scalpel was used to make an incision to the skin.  I used a bone marrow aspirate needle. I obtained 2 bone marrow aspirates without difficulty.  I then used the Jamshidi biopsy needle. I obtained an excellent bone marrow biopsy core.  I cleaned and dressed the procedure site sterilely.  He tolerated the procedure well. There were no complications.  I got his wife. I answered her questions. I brought her back to the procedure room.  Frederich Cha 1:12

## 2016-02-12 NOTE — Sedation Documentation (Signed)
Medication dose calculated and verified for Demerol and Versed.

## 2016-02-12 NOTE — Sedation Documentation (Signed)
Vital signs stable. 

## 2016-02-12 NOTE — Sedation Documentation (Signed)
Dr. Ennever at bedside. 

## 2016-02-12 NOTE — Sedation Documentation (Signed)
Wife updated as to patient's status by [name]

## 2016-02-12 NOTE — Discharge Instructions (Signed)
Moderate Conscious Sedation, Adult, Care After Refer to this sheet in the next few weeks. These instructions provide you with information on caring for yourself after your procedure. Your health care provider may also give you more specific instructions. Your treatment has been planned according to current medical practices, but problems sometimes occur. Call your health care provider if you have any problems or questions after your procedure. WHAT TO EXPECT AFTER THE PROCEDURE  After your procedure:  You may feel sleepy, clumsy, and have poor balance for several hours.  Vomiting may occur if you eat too soon after the procedure. HOME CARE INSTRUCTIONS  Do not participate in any activities where you could become injured for at least 24 hours. Do not:  Drive.  Swim.  Ride a bicycle.  Operate heavy machinery.  Cook.  Use power tools.  Climb ladders.  Work from a high place.  Do not make important decisions or sign legal documents until you are improved.  If you vomit, drink water, juice, or soup when you can drink without vomiting. Make sure you have little or no nausea before eating solid foods.  Only take over-the-counter or prescription medicines for pain, discomfort, or fever as directed by your health care provider.  Make sure you and your family fully understand everything about the medicines given to you, including what side effects may occur.  You should not drink alcohol, take sleeping pills, or take medicines that cause drowsiness for at least 24 hours.  If you smoke, do not smoke without supervision.  If you are feeling better, you may resume normal activities 24 hours after you were sedated.  Keep all appointments with your health care provider. SEEK MEDICAL CARE IF:  Your skin is pale or bluish in color.  You continue to feel nauseous or vomit.  Your pain is getting worse and is not helped by medicine.  You have bleeding or swelling.  You are still  sleepy or feeling clumsy after 24 hours. SEEK IMMEDIATE MEDICAL CARE IF:  You develop a rash.  You have difficulty breathing.  You develop any type of allergic problem.  You have a fever. MAKE SURE YOU:  Understand these instructions.  Will watch your condition.  Will get help right away if you are not doing well or get worse.   This information is not intended to replace advice given to you by your health care provider. Make sure you discuss any questions you have with your health care provider.   Document Released: 04/20/2013 Document Revised: 07/21/2014 Document Reviewed: 04/20/2013 Elsevier Interactive Patient Education 2016 [employer].   Bone Marrow Aspiration and Bone Marrow Biopsy, Care After Refer to this sheet in the next few weeks. These instructions provide you with information about caring for yourself after your procedure. Your health care provider may also give you more specific instructions. Your treatment has been planned according to current medical practices, but problems sometimes occur. Call your health care provider if you have any problems or questions after your procedure. WHAT TO EXPECT AFTER THE PROCEDURE After your procedure, it is common to have:  Soreness or tenderness around the puncture site.  Bruising. HOME CARE INSTRUCTIONS  Take medicines only as directed by your health care provider.  Follow your health care provider's instructions about:  Puncture site care.   Bandage (dressing) changes and removal. May remove dressing and shower in 24 hours. Keep site clean and dry.  Bathe and shower as directed by your health care provider.  Check  your puncture site every day for signs of infection. Watch for:  Redness, swelling, or pain.  Fluid, blood, or pus.  Return to your normal activities as directed by your health care provider.  Keep all follow-up visits as directed by your health care provider. This is important. SEEK MEDICAL CARE  IF:  You have a fever.  You have uncontrollable bleeding.  You have redness, swelling, or pain at the site of your puncture.  You have fluid, blood, or pus coming from your puncture site.   This information is not intended to replace advice given to you by your health care provider. Make sure you discuss any questions you have with your health care provider.   Document Released: 01/17/2005 Document Revised: 11/14/2014 Document Reviewed: 06/21/2014 Elsevier Interactive Patient Education [employer].

## 2016-02-13 ENCOUNTER — Encounter (HOSPITAL_COMMUNITY): Payer: Self-pay | Admitting: Interventional Radiology

## 2016-02-13 ENCOUNTER — Other Ambulatory Visit (HOSPITAL_COMMUNITY): Payer: Self-pay | Admitting: Interventional Radiology

## 2016-02-13 DIAGNOSIS — C9000 Multiple myeloma not having achieved remission: Secondary | ICD-10-CM

## 2016-02-13 DIAGNOSIS — IMO0002 Reserved for concepts with insufficient information to code with codable children: Secondary | ICD-10-CM

## 2016-02-13 DIAGNOSIS — M549 Dorsalgia, unspecified: Secondary | ICD-10-CM

## 2016-02-14 ENCOUNTER — Other Ambulatory Visit: Payer: Self-pay | Admitting: Hematology & Oncology

## 2016-02-14 ENCOUNTER — Other Ambulatory Visit (HOSPITAL_COMMUNITY): Payer: Self-pay | Admitting: Interventional Radiology

## 2016-02-14 ENCOUNTER — Other Ambulatory Visit: Payer: Self-pay | Admitting: Physician Assistant

## 2016-02-14 ENCOUNTER — Other Ambulatory Visit: Payer: Self-pay | Admitting: Radiology

## 2016-02-14 DIAGNOSIS — C9000 Multiple myeloma not having achieved remission: Secondary | ICD-10-CM

## 2016-02-14 LAB — UIFE/LIGHT CHAINS/TP QN, 24-HR UR
FR KAPPA LT CH,24HR: 4 mg/(24.h)
FR LAMBDA LT CH,24HR: 0 mg/(24.h)
FREE KAPPA LT CHAINS, UR: 1.22 mg/L — AB (ref 1.35–24.19)
Free Lambda Lt Chains,Ur: 0.07 mg/L — ABNORMAL LOW (ref 0.24–6.66)
KAPPA/LAMBDA RATIO, U: 17.43 — AB (ref 2.04–10.37)
PROTEIN,TOTAL,URINE: <4 mg/dL
Prot,24hr calculated: <120 mg/24 hr (ref 30–150)

## 2016-02-15 ENCOUNTER — Ambulatory Visit (HOSPITAL_COMMUNITY)
Admission: RE | Admit: 2016-02-15 | Discharge: 2016-02-15 | Disposition: A | Discharge status: Home / Self Care | Payer: 59 | Source: Ambulatory Visit | Attending: Interventional Radiology | Admitting: Interventional Radiology

## 2016-02-15 ENCOUNTER — Encounter (HOSPITAL_COMMUNITY): Payer: Self-pay | Admitting: Certified Registered"

## 2016-02-15 ENCOUNTER — Inpatient Hospital Stay (HOSPITAL_COMMUNITY): Payer: 59 | Admitting: Certified Registered"

## 2016-02-15 ENCOUNTER — Encounter (HOSPITAL_COMMUNITY)
Admission: RE | Disposition: A | Discharge status: Home / Self Care | Payer: Self-pay | Source: Ambulatory Visit | Attending: Interventional Radiology

## 2016-02-15 ENCOUNTER — Encounter (HOSPITAL_COMMUNITY): Payer: Self-pay

## 2016-02-15 DIAGNOSIS — IMO0002 Reserved for concepts with insufficient information to code with codable children: Secondary | ICD-10-CM

## 2016-02-15 DIAGNOSIS — Z87891 Personal history of nicotine dependence: Secondary | ICD-10-CM | POA: Insufficient documentation

## 2016-02-15 DIAGNOSIS — M8458XA Pathological fracture in neoplastic disease, other specified site, initial encounter for fracture: Secondary | ICD-10-CM | POA: Insufficient documentation

## 2016-02-15 DIAGNOSIS — M549 Dorsalgia, unspecified: Secondary | ICD-10-CM

## 2016-02-15 DIAGNOSIS — C9000 Multiple myeloma not having achieved remission: Secondary | ICD-10-CM | POA: Insufficient documentation

## 2016-02-15 HISTORY — PX: IR GENERIC HISTORICAL: IMG1180011

## 2016-02-15 HISTORY — PX: RADIOLOGY WITH ANESTHESIA: SHX6223

## 2016-02-15 LAB — BASIC METABOLIC PANEL
Anion gap: 4 — ABNORMAL LOW (ref 5–15)
BUN: 12 mg/dL (ref 6–20)
CHLORIDE: 105 mmol/L (ref 101–111)
CO2: 25 mmol/L (ref 22–32)
Calcium: 9.7 mg/dL (ref 8.9–10.3)
Creatinine, Ser: 0.84 mg/dL (ref 0.61–1.24)
Glucose, Bld: 99 mg/dL (ref 65–99)
POTASSIUM: 4.2 mmol/L (ref 3.5–5.1)
SODIUM: 134 mmol/L — AB (ref 135–145)

## 2016-02-15 LAB — CBC
HEMATOCRIT: 35.9 % — AB (ref 39.0–52.0)
HEMOGLOBIN: 11.6 g/dL — AB (ref 13.0–17.0)
MCH: 33.4 pg (ref 26.0–34.0)
MCHC: 32.3 g/dL (ref 30.0–36.0)
MCV: 103.5 fL — AB (ref 78.0–100.0)
Platelets: 239 10*3/uL (ref 150–400)
RBC: 3.47 MIL/uL — AB (ref 4.22–5.81)
RDW: 14.2 % (ref 11.5–15.5)
WBC: 5.3 10*3/uL (ref 4.0–10.5)

## 2016-02-15 LAB — APTT: APTT: 26 s (ref 24–36)

## 2016-02-15 LAB — PROTIME-INR
INR: 1.23
Prothrombin Time: 15.5 seconds — ABNORMAL HIGH (ref 11.4–15.2)

## 2016-02-15 SURGERY — RADIOLOGY WITH ANESTHESIA
Anesthesia: General

## 2016-02-15 MED ORDER — MIDAZOLAM HCL 5 MG/5ML IJ SOLN
INTRAMUSCULAR | Status: DC | PRN
  Administered 2016-02-15: 2 mg via INTRAVENOUS

## 2016-02-15 MED ORDER — KETOROLAC TROMETHAMINE 30 MG/ML IJ SOLN
INTRAMUSCULAR | Status: AC
  Filled 2016-02-15: qty 1

## 2016-02-15 MED ORDER — SODIUM CHLORIDE 0.9 % IV SOLN: INTRAVENOUS | Status: DC

## 2016-02-15 MED ORDER — CEFAZOLIN SODIUM-DEXTROSE 2-4 GM/100ML-% IV SOLN
INTRAVENOUS | Status: AC
  Filled 2016-02-15: qty 100

## 2016-02-15 MED ORDER — IOPAMIDOL (ISOVUE-300) INJECTION 61%
INTRAVENOUS | Status: AC
  Filled 2016-02-15: qty 50

## 2016-02-15 MED ORDER — ROCURONIUM BROMIDE 100 MG/10ML IV SOLN
INTRAVENOUS | Status: DC | PRN
  Administered 2016-02-15 (×2): 10 mg via INTRAVENOUS
  Administered 2016-02-15: 50 mg via INTRAVENOUS
  Administered 2016-02-15: 20 mg via INTRAVENOUS

## 2016-02-15 MED ORDER — HYDROCODONE-ACETAMINOPHEN 10-325 MG PO TABS
1.0000 | ORAL_TABLET | Freq: Once | ORAL | Status: AC
  Administered 2016-02-15: 1 via ORAL
  Filled 2016-02-15: qty 1

## 2016-02-15 MED ORDER — TOBRAMYCIN SULFATE 1.2 G IJ SOLR
INTRAMUSCULAR | Status: DC | PRN
  Administered 2016-02-15: 1.2 g via TOPICAL

## 2016-02-15 MED ORDER — PHENYLEPHRINE HCL 10 MG/ML IJ SOLN
INTRAMUSCULAR | Status: DC | PRN
  Administered 2016-02-15 (×3): 80 ug via INTRAVENOUS

## 2016-02-15 MED ORDER — PROPOFOL 10 MG/ML IV BOLUS
INTRAVENOUS | Status: DC | PRN
  Administered 2016-02-15: 150 mg via INTRAVENOUS

## 2016-02-15 MED ORDER — TOBRAMYCIN SULFATE 1.2 G IJ SOLR
INTRAMUSCULAR | Status: AC
  Filled 2016-02-15: qty 1.2

## 2016-02-15 MED ORDER — FENTANYL CITRATE (PF) 100 MCG/2ML IJ SOLN
25.0000 ug | INTRAMUSCULAR | Status: DC | PRN
  Administered 2016-02-15 (×4): 25 ug via INTRAVENOUS

## 2016-02-15 MED ORDER — FENTANYL CITRATE (PF) 100 MCG/2ML IJ SOLN
INTRAMUSCULAR | Status: AC
  Filled 2016-02-15: qty 2

## 2016-02-15 MED ORDER — SUGAMMADEX SODIUM 200 MG/2ML IV SOLN
INTRAVENOUS | Status: DC | PRN
  Administered 2016-02-15: 200 mg via INTRAVENOUS

## 2016-02-15 MED ORDER — LIDOCAINE HCL (CARDIAC) 20 MG/ML IV SOLN
INTRAVENOUS | Status: DC | PRN
  Administered 2016-02-15: 100 mg via INTRAVENOUS

## 2016-02-15 MED ORDER — PROMETHAZINE HCL 25 MG/ML IJ SOLN: 6.2500 mg | INTRAMUSCULAR | Status: DC | PRN

## 2016-02-15 MED ORDER — FENTANYL CITRATE (PF) 250 MCG/5ML IJ SOLN
INTRAMUSCULAR | Status: DC | PRN
  Administered 2016-02-15 (×2): 50 ug via INTRAVENOUS

## 2016-02-15 MED ORDER — CEFAZOLIN SODIUM-DEXTROSE 2-4 GM/100ML-% IV SOLN
2.0000 g | Freq: Once | INTRAVENOUS | Status: AC
  Administered 2016-02-15: 2 g via INTRAVENOUS

## 2016-02-15 MED ORDER — KETOROLAC TROMETHAMINE 30 MG/ML IJ SOLN
30.0000 mg | Freq: Once | INTRAMUSCULAR | Status: AC
  Administered 2016-02-15: 30 mg via INTRAVENOUS

## 2016-02-15 MED ORDER — LACTATED RINGERS IV SOLN
INTRAVENOUS | Status: DC | PRN
  Administered 2016-02-15 (×2): via INTRAVENOUS

## 2016-02-15 MED ORDER — MIDAZOLAM HCL 2 MG/2ML IJ SOLN
INTRAMUSCULAR | Status: AC
  Filled 2016-02-15: qty 2

## 2016-02-15 NOTE — Anesthesia Preprocedure Evaluation (Signed)
Anesthesia Evaluation  Patient identified by MRN, date of birth, ID band Patient awake    Reviewed: Allergy & Precautions, NPO status , Patient's Chart, lab work & pertinent test results  Airway Mallampati: II  TM Distance: >3 FB Neck ROM: Full    Dental  (+) Teeth Intact, Dental Advisory Given   Pulmonary former smoker,    Pulmonary exam normal breath sounds clear to auscultation       Cardiovascular Exercise Tolerance: Good negative cardio ROS Normal cardiovascular exam Rhythm:Regular Rate:Normal     Neuro/Psych PSYCHIATRIC DISORDERS Anxiety negative neurological ROS     GI/Hepatic negative GI ROS, Neg liver ROS,   Endo/Other  negative endocrine ROS  Renal/GU negative Renal ROS     Musculoskeletal negative musculoskeletal ROS (+)   Abdominal   Peds  Hematology negative hematology ROS (+)   Anesthesia Other Findings Day of surgery medications reviewed with the patient.  Myeloma  Reproductive/Obstetrics                             Anesthesia Physical Anesthesia Plan  ASA: III  Anesthesia Plan: General   Post-op Pain Management:    Induction: Intravenous  Airway Management Planned: Oral ETT  Additional Equipment:   Intra-op Plan:   Post-operative Plan: Extubation in OR  Informed Consent: I have reviewed the patients History and Physical, chart, labs and discussed the procedure including the risks, benefits and alternatives for the proposed anesthesia with the patient or authorized representative who has indicated his/her understanding and acceptance.   Dental advisory given  Plan Discussed with: CRNA  Anesthesia Plan Comments: (Risks/benefits of general anesthesia discussed with patient including risk of damage to teeth, lips, gum, and tongue, nausea/vomiting, allergic reactions to medications, and the possibility of heart attack, stroke and death.  All patient questions  answered.  Patient wishes to proceed.)        Anesthesia Quick Evaluation

## 2016-02-15 NOTE — Anesthesia Procedure Notes (Signed)
Procedure Name: Intubation Date/Time: 02/15/2016 1:10 PM Performed by: Myna Bright Pre-anesthesia Checklist: Patient identified, Emergency Drugs available, Suction available and Patient being monitored Patient Re-evaluated:Patient Re-evaluated prior to inductionOxygen Delivery Method: Circle system utilized Preoxygenation: Pre-oxygenation with 100% oxygen Intubation Type: IV induction Ventilation: Mask ventilation without difficulty Laryngoscope Size: Mac and 4 Grade View: Grade I Tube type: Oral Tube size: 7.5 mm Number of attempts: 1 Airway Equipment and Method: Stylet Placement Confirmation: ETT inserted through vocal cords under direct vision,  positive ETCO2 and breath sounds checked- equal and bilateral Secured at: 22 cm Tube secured with: Tape Dental Injury: Teeth and Oropharynx as per pre-operative assessment

## 2016-02-15 NOTE — Transfer of Care (Signed)
Immediate Anesthesia Transfer of Care Note  Patient: Jorge [name]  Procedure(s) Performed: Procedure(s): Spinal Ablation (N/A)  Patient Location: PACU  Anesthesia Type:General  Level of Consciousness: awake, alert , oriented and patient cooperative  Airway & Oxygen Therapy: Patient Spontanous Breathing and Patient connected to nasal cannula oxygen  Post-op Assessment: Report given to RN, Post -op Vital signs reviewed and stable and Patient moving all extremities  Post vital signs: Reviewed and stable  Last Vitals: There were no vitals filed for this visit.  Last Pain: There were no vitals filed for this visit.       Complications: No apparent anesthesia complications

## 2016-02-15 NOTE — Anesthesia Postprocedure Evaluation (Signed)
Anesthesia Post Note  Patient: Jorge [name]  Procedure(s) Performed: Procedure(s) (LRB): Spinal Ablation (N/A)  Patient location during evaluation: PACU Anesthesia Type: General Level of consciousness: awake and alert Pain management: pain level controlled Vital Signs Assessment: post-procedure vital signs reviewed and stable Respiratory status: spontaneous breathing, nonlabored ventilation, respiratory function stable and patient connected to nasal cannula oxygen Cardiovascular status: blood pressure returned to baseline and stable Postop Assessment: no signs of nausea or vomiting Anesthetic complications: no    Last Vitals:  Vitals:   02/15/16 1625 02/15/16 1630  BP: 131/75   Pulse: 73 71  Resp: 13 14  Temp:      Last Pain:  Vitals:   02/15/16 1615  PainSc: 5                  [name] J

## 2016-02-15 NOTE — H&P (Addendum)
Chief Complaint: Patient was seen in consultation today for Thoracic 7 and Thoracic 12 radiofrequency ablation with possible vertebrolasty at the request of Dr Burney Gauze  Referring Physician(s): Dr Burney Gauze  Supervising Physician: Luanne Bras  Patient Status: Outpatient  History of Present Illness: Jorge [name] is a 60 y.o. male   Hx Multiple Myeloma (BM biopsy 8/1 reveals +plasma cell myeloma) Follows with [name] Previous Thoracic 9 vertebroplasty 01/25/2016 Worsening back pain New pathologic fractures  7/28 Bone Survey METASTATIC BONE SURVEY COMPARISON:  None. FINDINGS: There numerous small lytic lesions throughout the calvarium concerning for multiple myeloma. Linear perihilar atelectasis in the left lung. Bibasilar atelectasis. Heart is borderline in size. No effusions. Changes of prior vertebroplasty at T9 with moderate compression fracture. Mild compression fracture at T7 and likely at T12. Mild compression fracture through the superior endplate of L2. Small lucent lesions are suspected throughout the bony pelvis and in the proximal femurs bilaterally.  Scheduled now for T7 and T12 radiofrequency ablation with possible vertebroplasty   Past Medical History:  Diagnosis Date  . Anxiety   . Myeloma ([place]) 2017    Past Surgical History:  Procedure Laterality Date  . COLONOSCOPY  [id number]  . INGUINAL HERNIA REPAIR  12/16/13   BIH by Dr. Zvi Boston  . IR GENERIC HISTORICAL  02/11/2016   IR RADIOLOGIST EVAL & MGMT 02/11/2016 [facility]  . ROTATOR CUFF REPAIR  2003    Allergies: Review of patient's allergies indicates no known allergies.  Medications: Prior to Admission medications   Medication Sig Start Date End Date Taking? Authorizing Provider  carisoprodol (SOMA) 350 MG tablet Take 350 mg by mouth 4 (four) times daily as needed for muscle spasms.   Yes Historical Provider, MD  fentaNYL (DURAGESIC - DOSED MCG/HR) 25 MCG/HR patch  Place 1 patch (25 mcg total) onto the skin every 3 (three) days. 02/08/16  Yes [name], MD  HYDROcodone-acetaminophen (NORCO) 10-325 MG tablet Take 0.5-1 tablets by mouth every 8 (eight) hours as needed for moderate pain.  01/08/16  Yes Historical Provider, MD  ibuprofen (ADVIL,MOTRIN) 400 MG tablet Take 400 mg by mouth every 6 (six) hours as needed for mild pain.    Yes Historical Provider, MD  magnesium oxide (MAG-OX) 400 MG tablet Take 400 mg by mouth daily.   Yes Historical Provider, MD  sertraline (ZOLOFT) 100 MG tablet Take 100 mg by mouth daily. 01/02/16  Yes Historical Provider, MD  VITAMIN D-VITAMIN K PO Take 10,000 Units by mouth every other day.    Yes Historical Provider, MD  zolpidem (AMBIEN) 10 MG tablet Take 5-10 mg by mouth at bedtime as needed for sleep.  01/03/16  Yes Historical Provider, MD     Family History  Problem Relation Age of Onset  . Cancer Mother     ovarian and colon  . Heart disease Father     Social History   Social History  . Marital status: Married    Spouse name: N/A  . Number of children: N/A  . Years of education: N/A   Social History Main Topics  . Smoking status: Former Smoker    Quit date: 11/02/1980  . Smokeless tobacco: Never Used  . Alcohol use Yes     Comment: 1-2 drinks/day  . Drug use: No  . Sexual activity: Not Asked   Other Topics Concern  . None   Social History Narrative  . None    Review of Systems: A 12 point ROS  discussed and pertinent positives are indicated in the HPI above.  All other systems are negative.  Review of Systems  Constitutional: Positive for activity change. Negative for fatigue and fever.  Respiratory: Negative for shortness of breath.   Cardiovascular: Negative for chest pain.  Gastrointestinal: Negative for abdominal pain.  Musculoskeletal: Positive for back pain.  Neurological: Positive for weakness.  Psychiatric/Behavioral: Negative for behavioral problems and confusion.    Vital  Signs: BP (!) 147/97 (BP Location: Right Arm)   Pulse 74   Temp 97.9 F (36.6 C) (Oral)   Ht '5\' 10"'$  (1.778 m)   Wt 185 lb (83.9 kg)   SpO2 97%   BMI 26.54 kg/m   Physical Exam  Constitutional: He is oriented to person, place, and time. He appears well-nourished.  Cardiovascular: Normal rate, regular rhythm and normal heart sounds.   No murmur heard. Pulmonary/Chest: Effort normal and breath sounds normal. He has no wheezes.  Abdominal: Soft. Bowel sounds are normal. There is no tenderness.  Musculoskeletal: Normal range of motion. He exhibits no edema.  Mid back pain  Neurological: He is alert and oriented to person, place, and time.  Skin: Skin is warm and dry.  Psychiatric: He has a normal mood and affect. His behavior is normal. Judgment and thought content normal.  Nursing note and vitals reviewed.   Mallampati Score:  MD Evaluation Airway: WNL Heart: WNL Abdomen: WNL Chest/ Lungs: WNL ASA  Classification: 3 Mallampati/Airway Score: One  Imaging: Dg Radiologist Eval And Mgmt  Result Date: 02/08/2016 CONSULTATION: Patient Name: Jorge [name], Jorge [name] Patient DOB:   Jun 24, 1956 Age: 60 y/o MRN: [id number] Patient's Current Pain Level: This is an automatically generated report for an Evaluation and Management Service. Refer to host system for full dictation. Dear Dr. Berle Mull ; Electronically Signed   By: Porfirio Mylar   On: 02/08/2016 09:20  Dg Bone Survey Met  Result Date: 02/08/2016 CLINICAL DATA:  Multiple myeloma. EXAM: METASTATIC BONE SURVEY COMPARISON:  None. FINDINGS: There numerous small lytic lesions throughout the calvarium concerning for multiple myeloma. Linear perihilar atelectasis in the left lung. Bibasilar atelectasis. Heart is borderline in size. No effusions. Changes of prior vertebroplasty at T9 with moderate compression fracture. Mild compression fracture at T7 and likely at T12. Mild compression fracture through the superior endplate of L2. Small lucent  lesions are suspected throughout the bony pelvis and in the proximal femurs bilaterally. IMPRESSION: Small lucent lesions throughout the calvarium. Suspect small scattered lytic lesions throughout the bony pelvis and in the proximal femurs. Compression fractures at T9 (prior vertebroplasty), T12, and L2. Electronically Signed   By: Rolm Baptise M.D.   On: 02/08/2016 17:30  Ir Vertebroplasty Cerv/thor Bx Inc Uni/bil Inc/inject/imaging  Result Date: 01/29/2016 INDICATION: Severe mid thoracic pain secondary to pathologic compression fracture at T9. EXAM: IR VERTEBROPLASTY CERVICOTHORACIC INJ MEDICATIONS: As antibiotic prophylaxis, 2 g Ancef IV was ordered pre-procedure and administered intravenously within 1 hour of incision. ANESTHESIA/SEDATION: Moderate (conscious) sedation was employed during this procedure. A total of Versed 2 mg and Fentanyl 50 mcg was administered intravenously. Dilaudid 1 mg IV. Moderate Sedation Time: 30 minutes. The patient's level of consciousness and vital signs were monitored continuously by radiology nursing throughout the procedure under my direct supervision. FLUOROSCOPY TIME:  Fluoroscopy Time: 8 minutes 6 seconds (409 mGy) COMPLICATIONS: None immediate. TECHNIQUE: Informed written consent was obtained from the patient after a thorough discussion of the procedural risks, benefits and alternatives. All questions were addressed. Maximal Sterile Barrier Technique was  utilized including caps, mask, sterile gowns, sterile gloves, sterile drape, hand hygiene and skin antiseptic. A timeout was performed prior to the initiation of the procedure. PROCEDURE: The patient was placed prone on the fluoroscopic table. Nasal oxygen was administered. Physiologic monitoring was performed throughout the duration of the procedure. The skin overlying the thoracic region was prepped and draped in the usual sterile fashion. The T9 vertebral body was identified and the right pedicle was infiltrated with  0.25% Bupivacaine. This was then followed by the advancement of a 13-gauge Cook needle through the right pedicle into the posterior one-third at T9. A 16 gauge Trocar biopsy needle was then advanced under fluoroscopic guidance. Using a 20 mL syringe, core biopsies were obtained. Two passes were made. The samples were then sent for pathologic analysis. The 13 gauge Cook spinal needle was then advanced into the anterior 1/3 at T9. A gentle contrast injection demonstrated a trabecular pattern of contrast. At this time, methylmethacrylate mixture was reconstituted. Under biplane intermittent fluoroscopy, the methylmethacrylate was then injected into the T9 vertebral body with filling of the vertebral body. No extravasation was noted into the disk spaces or posteriorly into the spinal canal. No epidural venous contamination was seen. The needle was then removed. Hemostasis was achieved at the skin entry site. There were no acute complications. Patient tolerated the procedure well. The patient was observed for 3 hours and discharged in good condition. IMPRESSION: 1. Status post vertebral body augmentation for painful compression fracture at T9 using vertebroplasty technique. 2. Two core biopsies were obtained and sent for pathologic analysis. Results of the pathology report to be sent to the patient's orthopedic surgeon, Dr. Alfonso Ramus, and also to Dr. Christella Noa. The patient and spouse were informed of this. Electronically Signed   By: Luanne Bras M.D.   On: 01/25/2016 10:05   Ir Radiologist Eval & Mgmt  Result Date: 02/13/2016 EXAM: ESTABLISHED PATIENT OFFICE VISIT CHIEF COMPLAINT: Severe mid thoracic pain and spasm.  Difficulty walking. Current Pain Level: 1-10 HISTORY OF PRESENT ILLNESS: The patient is a 60 year old right-handed gentleman who underwent an uneventful vertebral body augmentation at T9 approximately 2 weeks ago. Samples were also obtained for pathologic analysis. The patient returns today  accompanied by his wife. Clinically, the patient reports modest improvement temporarily following the vertebroplasty at T9. The patient was doing fairly well until Saturday. Yesterday the patient started having severe difficulties with spasm and also pain in his thoracic region and to a lesser extent in his lumbar region. The patient has been unable to lie flat on his back because of the pain and spasm. He sleeps on a sofa every day. The patient has severe difficulty standing up from a sitting position or getting up from a from a lying position. However, once he is up, he ambulates independently though with some difficulty. He denies any recent chills, fever or rigors. He denies any pain shooting down his legs. He also denies any autonomic dysfunction of his bowel or bladder. The patient quit taking his hydrocodone a few days ago and was given an IV fentanyl patch. This has resulted in mild pain control. He claims his appetite is normal.  His weight has remained stable. He was seen by his oncologist, Dr. Marin Olp who ordered some radiographic tests and also some clinical tests. He is due to see the oncologist sometime this week. His past medical history, family history and social history remain unchanged from the time of the history and physical performed about 2 weeks ago.  Present Medications: Calcium carbonate Os-Cal, fentanyl patch, hydrocodone as needed, ibuprofen as needed, Zoloft, tizanidine, vitamin D and vitamin K, Ambien. Allergies:  No known allergies. Review of systems as above. PHYSICAL EXAMINATION: In obvious moderate to moderately severe distress on account of the pain and spasm in his thoracic region. Neurologically, however, remains intact. The patient exhibited significant spasm in the lower thoracic region with mild pain on deep palpation. He had no pain in the lumbar region. ASSESSMENT AND PLAN: The patient's recent biochemical tests and radiographic tests ordered by his oncologist were reviewed  with the patient. These apparently appeared to suggest the presence of multiple myeloma. The lateral x-ray of the spine also reveals interval development of a T7 compression fracture and possibly T12. The T9 fracture that was treated with vertebroplasty remains stable. The patient is scheduled see Dr. Marin Olp tomorrow for a bone marrow biopsy. Given the patient's severe symptoms of thoracic pain and spasm, the patient was advised to discuss with the oncologist vertebral body augmentation at T7 and T12 levels preceded by radiofrequency ablation. The procedure was described in detail. The patient will let us know following his appointment with the oncologist in regards of proceeding with T7 and T12 vertebral body augmentation proceeded by radiofrequency ablation. Questions were answered to their satisfaction. The patient was advised not to stoop, bend or lift anything above 10 pounds. Electronically Signed   By: Luanne Bras M.D.   On: 02/11/2016 18:37   Ir Radiologist Eval & Mgmt  Result Date: 01/29/2016 EXAM: NEW PATIENT OFFICE VISIT CHIEF COMPLAINT: Severe thoracic pain secondary to compression fracture at T9. Current Pain Level: 1-10 HISTORY OF PRESENT ILLNESS: Patient is a 60 year old gentleman referred for evaluation for pain relief secondary to compression fracture at T9. The patient is accompanied by his wife. Briefly, the patient reports almost sudden onset of mid thoracic discomfort approximately 5-6 weeks ago which she apparently experienced while being at work and having lifted objects. The patient apparently had seemingly worsening of his symptoms 1-2 weeks later again after having lifted some objects. At that time, the pain was severe enough to necessitate workup. Two plain x-ray films reportedly were unremarkable. The patient subsequently underwent an MRI of the thoracic spine which revealed a compression fracture at T9 associated mild retropulsion with signal changes consistent with acute to  subacute fracture. Additionally noted was diffuse variegated signal within the marrow and the visualized thoracic spine in its entirety. The signal changes noted on this MRI were different from the more homogeneous normal appearing signal seen on his lumbar sacral spine MRI scan performed in August 2016. At the present time, the patient reports a 7 out of 10 pain just below his shoulder blades almost constant associated with a diffuse spasm more in the thoracic spine than in the lumbar spine. This has more less curtailed his daily activities and work. The patient is able to ambulate independently though with difficulty. There is no radiation of pain into the lower extremities, though he does have circumferential radiation of pain just below the shoulder blade regions. This does catch his breath intermittently. He denies any autonomic dysfunction of his bowel or bladder. Denies any UTI symptoms of dysuria, polyuria or hematuria. His appetite remains normal. He has lost about 5 pounds since the beginning of the pain approximately 6 weeks ago. Overall he has also lost about 15 pound over the past year, although reportedly has gained some of it. He attributes this mainly to his work. He denies any chills  or fever or rigors. Past Medical History: Hernia repair in 2014.  Depression. Medications: Norco 1 tablet a day as needed for severe intractable pain. Soma 1/2-1 tablet as needed for muscle spasms. Zoloft for depression. Ambien to help him go to sleep. Calcium and vitamin-D supplementation. Occasionally ibuprofen for pain as needed. Allergies: No known allergies. Social History: The patient owns a private business, is married. Has three children, two girls and a boy alive and well. Patient is self-employed. He drinks up to 10-12 drinks per week. He denies smoking cigarettes or using illicit chemicals. Family History: Mother died of uterine cancer. Father died of old age. Sister has multiple sclerosis. REVIEW OF SYSTEMS:  Essentially negative except for as mentioned above. PHYSICAL EXAMINATION: Appears in moderate to moderately severe discomfort on account of the pain in his lower thoracic region. Points to just below the scapula where the pain is intense and where the circumferential radiation is. Neurologically otherwise is normal. Station and gait is unremarkable. ASSESSMENT AND PLAN: Patient's recent MRI of the the thoracic spine was reviewed with him and his wife and compared to the previous MRI scan of the lumbar sacral spine of August of 2016. The T9 fracture was brought to their attention. Also brought to their attention was the heterogeneous appearance of the marrow in the thoracic spine in its entirety. This was compared to the more homogeneous marrow signal on the lumbar sacral spine MRI scan in August of 2016. The option was vertebral body augmentation for pain relief and to prevent further collapse. Also a marrow biopsy at the same setting may be helpful given the changes in the patient's marrow signal in the thoracic spine compared to the MRI of the lumbar sacral spine of August 2016. Patient and his spouse were also informed to consult with their primary care giver regarding further workup regarding possible causes of abnormal marrow signal changes. Questions were answered to their satisfaction. They both leave with good understanding and agreement with above management plan. Electronically Signed   By: Luanne Bras M.D.   On: 01/22/2016 14:19    Labs:  CBC:  Recent Labs  01/25/16 0640 02/08/16 1420 02/12/16 0730 02/15/16 1000  WBC 5.2 5.9 4.9 5.3  HGB 11.9* 12.2* 11.2* 11.6*  HCT 35.1* 35.9* 33.8* 35.9*  PLT 206 256 270 239    COAGS:  Recent Labs  01/25/16 0640 02/15/16 1000  INR 1.19 1.23  APTT 27 26    BMP:  Recent Labs  01/25/16 0640 02/08/16 1420 02/15/16 1000  NA 133* 129 134*  K 4.0 4.0 4.2  CL 101 102 105  CO2 _0 GLUCOSE 106* 88 99  BUN _1 CALCIUM  10.1 9.7 9.7  CREATININE 0.84 1.1 0.84  GFRNONAA >60  --  >60  GFRAA >60  --  >60    LIVER FUNCTION TESTS:  Recent Labs  02/08/16 1420  BILITOT 0.70  AST 25  ALT 34  ALKPHOS 83  PROT 9.9*  9.5*  ALBUMIN 3.6    TUMOR MARKERS: No results for input(s): AFPTM, CEA, CA199, CHROMGRNA in the last 8760 hours.  Assessment and Plan:  Dx Multiple Myeloma Painful pathologic fractures Thoracic 7 and Thoracic 12 Scheduled now for radio frequency ablation and possible vertebroplasty Risks and Benefits discussed with the patient including, but not limited to education regarding the natural healing process of compression fractures without intervention, bleeding, infection, cement migration which may cause spinal cord damage, paralysis, pulmonary embolism or even  death. All of the patient's questions were answered, patient is agreeable to proceed. Consent signed and in chart.  Thank you for this interesting consult.  I greatly enjoyed meeting Jorge [name] and look forward to participating in their care.  A copy of this report was sent to the requesting provider on this date.  Electronically Signed: Shelaine Frie A 02/15/2016, 10:38 AM   I spent a total of  30 Minutes   in face to face in clinical consultation, greater than 50% of which was counseling/coordinating care for T7 and T12 RFA/VP

## 2016-02-15 NOTE — H&P (Deleted)
  The note originally documented on this encounter has been moved the the encounter in which it belongs.  

## 2016-02-15 NOTE — Procedures (Signed)
S/P T7,T11 and T 12 RF ablation followed by vertebral body augmentation under GA

## 2016-02-15 NOTE — Discharge Instructions (Signed)
Discharge instructions   -Remove dressing tomorrow and apply bandage. Keep dressing dry. May shower. -Advance diet as tolerated. -ice to affected area.

## 2016-02-18 ENCOUNTER — Ambulatory Visit (HOSPITAL_COMMUNITY): Payer: 59

## 2016-02-18 ENCOUNTER — Encounter (HOSPITAL_COMMUNITY): Payer: Self-pay | Admitting: Interventional Radiology

## 2016-02-18 ENCOUNTER — Encounter (HOSPITAL_COMMUNITY)
Admission: RE | Admit: 2016-02-18 | Discharge: 2016-02-18 | Disposition: A | Discharge status: Home / Self Care | Payer: 59 | Source: Ambulatory Visit | Attending: Hematology & Oncology | Admitting: Hematology & Oncology

## 2016-02-18 DIAGNOSIS — C9000 Multiple myeloma not having achieved remission: Secondary | ICD-10-CM | POA: Insufficient documentation

## 2016-02-18 LAB — GLUCOSE, CAPILLARY: GLUCOSE-CAPILLARY: 98 mg/dL (ref 65–99)

## 2016-02-18 MED ORDER — FLUDEOXYGLUCOSE F - 18 (FDG) INJECTION
10.5200 | Freq: Once | INTRAVENOUS | Status: AC | PRN
  Administered 2016-02-18: 10.52 via INTRAVENOUS

## 2016-02-19 ENCOUNTER — Telehealth: Payer: Self-pay

## 2016-02-19 ENCOUNTER — Other Ambulatory Visit: Payer: Self-pay

## 2016-02-19 MED ORDER — PROCHLORPERAZINE MALEATE 10 MG PO TABS: 10.0000 mg | ORAL_TABLET | Freq: Four times a day (QID) | ORAL | 3 refills | Status: DC | PRN

## 2016-02-19 NOTE — Telephone Encounter (Signed)
-----   Message from [name], MD sent at 02/18/2016  4:54 PM EDT ----- Call - the PET scan dose show some hypermetabolism in the bones - NOT as bad as I thought it would be!!!  Jorge [name]

## 2016-02-21 ENCOUNTER — Other Ambulatory Visit (HOSPITAL_COMMUNITY): Payer: Self-pay | Admitting: Interventional Radiology

## 2016-02-21 ENCOUNTER — Other Ambulatory Visit: Payer: Self-pay

## 2016-02-21 DIAGNOSIS — IMO0002 Reserved for concepts with insufficient information to code with codable children: Secondary | ICD-10-CM

## 2016-02-21 DIAGNOSIS — C9000 Multiple myeloma not having achieved remission: Secondary | ICD-10-CM

## 2016-02-21 LAB — TISSUE HYBRIDIZATION (BONE MARROW)-NCBH

## 2016-02-21 LAB — CHROMOSOME ANALYSIS, BONE MARROW

## 2016-02-22 ENCOUNTER — Ambulatory Visit (HOSPITAL_BASED_OUTPATIENT_CLINIC_OR_DEPARTMENT_OTHER): Payer: 59 | Admitting: Hematology & Oncology

## 2016-02-22 ENCOUNTER — Encounter: Payer: Self-pay | Admitting: Hematology & Oncology

## 2016-02-22 ENCOUNTER — Other Ambulatory Visit (HOSPITAL_BASED_OUTPATIENT_CLINIC_OR_DEPARTMENT_OTHER): Payer: 59

## 2016-02-22 ENCOUNTER — Encounter: Payer: Self-pay | Admitting: Nurse Practitioner

## 2016-02-22 DIAGNOSIS — C9000 Multiple myeloma not having achieved remission: Secondary | ICD-10-CM

## 2016-02-22 DIAGNOSIS — D472 Monoclonal gammopathy: Secondary | ICD-10-CM

## 2016-02-22 HISTORY — DX: Multiple myeloma not having achieved remission: C90.00

## 2016-02-22 LAB — CBC WITH DIFFERENTIAL (CANCER CENTER ONLY)
BASO#: 0 10*3/uL (ref 0.0–0.2)
BASO%: 0.4 % (ref 0.0–2.0)
EOS ABS: 0.1 10*3/uL (ref 0.0–0.5)
EOS%: 2.6 % (ref 0.0–7.0)
HCT: 39.2 % (ref 38.7–49.9)
HGB: 13.3 g/dL (ref 13.0–17.1)
LYMPH#: 1.9 10*3/uL (ref 0.9–3.3)
LYMPH%: 34.3 % (ref 14.0–48.0)
MCH: 35.2 pg — AB (ref 28.0–33.4)
MCHC: 33.9 g/dL (ref 32.0–35.9)
MCV: 104 fL — AB (ref 82–98)
MONO#: 0.3 10*3/uL (ref 0.1–0.9)
MONO%: 6.3 % (ref 0.0–13.0)
NEUT#: 3.1 10*3/uL (ref 1.5–6.5)
NEUT%: 56.4 % (ref 40.0–80.0)
PLATELETS: 229 10*3/uL (ref 145–400)
RBC: 3.78 10*6/uL — ABNORMAL LOW (ref 4.20–5.70)
RDW: 14.1 % (ref 11.1–15.7)
WBC: 5.4 10*3/uL (ref 4.0–10.0)

## 2016-02-22 LAB — COMPREHENSIVE METABOLIC PANEL
ALT: 24 U/L (ref 0–55)
ANION GAP: 7 meq/L (ref 3–11)
AST: 19 U/L (ref 5–34)
Albumin: 3.6 g/dL (ref 3.5–5.0)
Alkaline Phosphatase: 95 U/L (ref 40–150)
BILIRUBIN TOTAL: 0.41 mg/dL (ref 0.20–1.20)
BUN: 16.4 mg/dL (ref 7.0–26.0)
CHLORIDE: 107 meq/L (ref 98–109)
CO2: 22 meq/L (ref 22–29)
CREATININE: 1 mg/dL (ref 0.7–1.3)
Calcium: 9.8 mg/dL (ref 8.4–10.4)
EGFR: 83 mL/min/{1.73_m2} — AB (ref >90)
Glucose: 107 mg/dl (ref 70–140)
Potassium: 3.8 mEq/L (ref 3.5–5.1)
Sodium: 136 mEq/L (ref 136–145)
Total Protein: 10.7 g/dL — ABNORMAL HIGH (ref 6.4–8.3)

## 2016-02-22 MED ORDER — DEXAMETHASONE 4 MG PO TABS: ORAL_TABLET | ORAL | 4 refills | Status: DC

## 2016-02-22 MED ORDER — FENTANYL 25 MCG/HR TD PT72: 25.0000 ug | MEDICATED_PATCH | TRANSDERMAL | 0 refills | Status: DC

## 2016-02-22 MED ORDER — LENALIDOMIDE 25 MG PO CAPS: ORAL_CAPSULE | ORAL | 6 refills | Status: DC

## 2016-02-22 MED ORDER — FAMCICLOVIR 500 MG PO TABS: 500.0000 mg | ORAL_TABLET | Freq: Every day | ORAL | 8 refills | Status: DC

## 2016-02-22 MED ORDER — ASPIRIN EC 325 MG PO TBEC: 325.0000 mg | DELAYED_RELEASE_TABLET | Freq: Every day | ORAL | 8 refills | Status: DC

## 2016-02-22 MED ORDER — HYDROCODONE-ACETAMINOPHEN 10-325 MG PO TABS: 0.5000 | ORAL_TABLET | Freq: Three times a day (TID) | ORAL | 0 refills | Status: DC | PRN

## 2016-02-22 MED ORDER — CARISOPRODOL 350 MG PO TABS: 350.0000 mg | ORAL_TABLET | Freq: Four times a day (QID) | ORAL | 3 refills | Status: DC | PRN

## 2016-02-22 NOTE — Patient Instructions (Addendum)
Zoledronic Acid injection (Hypercalcemia, Oncology)  What is this medicine?  ZOLEDRONIC ACID (ZOE le dron ik AS id) lowers the amount of calcium loss from bone. It is used to treat too much calcium in your blood from cancer. It is also used to prevent complications of cancer that has spread to the bone.  This medicine may be used for other purposes; ask your health care provider or pharmacist if you have questions.  What should I tell my health care provider before I take this medicine?  They need to know if you have any of these conditions:  -aspirin-sensitive asthma  -cancer, especially if you are receiving medicines used to treat cancer  -dental disease or wear dentures  -infection  -kidney disease  -receiving corticosteroids like dexamethasone or prednisone  -an unusual or allergic reaction to zoledronic acid, other medicines, foods, dyes, or preservatives  -pregnant or trying to get pregnant  -breast-feeding  How should I use this medicine?  This medicine is for infusion into a vein. It is given by a health care professional in a hospital or clinic setting.  Talk to your pediatrician regarding the use of this medicine in children. Special care may be needed.  Overdosage: If you think you have taken too much of this medicine contact a poison control center or emergency room at once.  NOTE: This medicine is only for you. Do not share this medicine with others.  What if I miss a dose?  It is important not to miss your dose. Call your doctor or health care professional if you are unable to keep an appointment.  What may interact with this medicine?  -certain antibiotics given by injection  -NSAIDs, medicines for pain and inflammation, like ibuprofen or naproxen  -some diuretics like bumetanide, furosemide  -teriparatide  -thalidomide  This list may not describe all possible interactions. Give your health care provider a list of all the medicines, herbs, non-prescription drugs, or dietary supplements you use. Also  tell them if you smoke, drink alcohol, or use illegal drugs. Some items may interact with your medicine.  What should I watch for while using this medicine?  Visit your doctor or health care professional for regular checkups. It may be some time before you see the benefit from this medicine. Do not stop taking your medicine unless your doctor tells you to. Your doctor may order blood tests or other tests to see how you are doing.  Women should inform their doctor if they wish to become pregnant or think they might be pregnant. There is a potential for serious side effects to an unborn child. Talk to your health care professional or pharmacist for more information.  You should make sure that you get enough calcium and vitamin D while you are taking this medicine. Discuss the foods you eat and the vitamins you take with your health care professional.  Some people who take this medicine have severe bone, joint, and/or muscle pain. This medicine may also increase your risk for jaw problems or a broken thigh bone. Tell your doctor right away if you have severe pain in your jaw, bones, joints, or muscles. Tell your doctor if you have any pain that does not go away or that gets worse.  Tell your dentist and dental surgeon that you are taking this medicine. You should not have major dental surgery while on this medicine. See your dentist to have a dental exam and fix any dental problems before starting this medicine. Take good care   your doctor or health care professional as soon as possible: -allergic reactions like skin rash, itching or hives, swelling of the face, lips, or tongue -anxiety, confusion, or depression -breathing problems -changes in vision -eye pain -feeling faint or lightheaded, falls -jaw pain,  especially after dental work -mouth sores -muscle cramps, stiffness, or weakness -redness, blistering, peeling or loosening of the skin, including inside the mouth -trouble passing urine or change in the amount of urine Side effects that usually do not require medical attention (report to your doctor or health care professional if they continue or are bothersome): -bone, joint, or muscle pain -constipation -diarrhea -fever -hair loss -irritation at site where injected -loss of appetite -nausea, vomiting -stomach upset -trouble sleeping -trouble swallowing -weak or tired This list may not describe all possible side effects. Call your doctor for medical advice about side effects. You may report side effects to FDA at 1-800-FDA-1088. Where should I keep my medicine? This drug is given in a hospital or clinic and will not be stored at home. NOTE: This sheet is a summary. It may not cover all possible information. If you have questions about this medicine, talk to your doctor, pharmacist, or health care provider.    2016, Elsevier/Gold Standard. (2013-11-26 14:19:39)   Lenalidomide Oral Capsules What is this medicine? LENALIDOMIDE (len a LID oh mide) is a chemotherapy drug that targets specific proteins within cancer cells and stops the cancer cell from growing. It is used to treat multiple myeloma, mantle cell lymphoma, and some myelodysplastic syndromes that cause severe anemia requiring blood transfusions. This medicine may be used for other purposes; ask your health care provider or pharmacist if you have questions. What should I tell my health care provider before I take this medicine? They need to know if you have any of these conditions: -blood clots in the legs or the lungs -high blood pressure -high cholesterol -infection -irregular monthly periods or menstrual cycles -kidney disease -liver disease -smoke tobacco -thyroid disease -an unusual or allergic reaction to  lenalidomide, other medicines, foods, dyes, or preservatives -pregnant or trying to get pregnant -breast-feeding How should I use this medicine? Take this medicine by mouth with a glass of water. Follow the directions on the prescription label. Do not cut, crush, or chew this medicine. Take your medicine at regular intervals. Do not take it more often than directed. Do not stop taking except on your doctor's advice. A MedGuide will be given with each prescription and refill. Read this guide carefully each time. The MedGuide may change frequently. Talk to your pediatrician regarding the use of this medicine in children. Special care may be needed. Overdosage: If you think you have taken too much of this medicine contact a poison control center or emergency room at once. NOTE: This medicine is only for you. Do not share this medicine with others. What if I miss a dose? If you miss a dose, take it as soon as you can. If your next dose is to be taken in less than 12 hours, then do not take the missed dose. Take the next dose at your regular time. Do not take double or extra doses. What may interact with this medicine? This medicine may interact with the following medications: -digoxin -medicines that increase the risk of thrombosis like estrogens or erythropoietic agents (e.g., epoetin alfa and darbepoetin alfa) -warfarin This list may not describe all possible interactions. Give your health care provider a list of all the medicines, herbs, non-prescription drugs, or dietary  supplements you use. Also tell them if you smoke, drink alcohol, or use illegal drugs. Some items may interact with your medicine. What should I watch for while using this medicine? Visit your doctor for regular check ups. Tell your doctor or healthcare professional if your symptoms do not start to get better or if they get worse. You will need to have important blood work done while you are taking this medicine. This medicine is  available only through a special program. Doctors, pharmacies, and patients must meet all of the conditions of the program. Your health care provider will help you get signed up with the program if you need this medicine. Through the program you will only receive up to a 28 day supply of the medicine at one time. You will need a new prescription for each refill. This medicine can cause birth defects. Do not get pregnant while taking this drug. Females with child-bearing potential will need to have 2 negative pregnancy tests before starting this medicine. Pregnancy testing must be done every 2 to 4 weeks as directed while taking this medicine. Use 2 reliable forms of birth control together while you are taking this medicine and for 1 month after you stop taking this medicine. If you think that you might be pregnant talk to your doctor right away. Men must use a latex condom during sexual contact with a woman while taking this medicine and for 28 days after you stop taking this medicine. A latex condom is needed even if you have had a vasectomy. Contact your doctor right away if your partner becomes pregnant. Do not donate sperm while taking this medicine and for 28 days after you stop taking this medicine. Do not give blood while taking the medicine and for 1 month after completion of treatment to avoid exposing pregnant women to the medicine through the donated blood. Talk to your doctor about your risk of cancer. You may be more at risk for certain types of cancers if you take this medicine. What side effects may I notice from receiving this medicine? Side effects that you should report to your doctor or health care professional as soon as possible: -allergic reactions like skin rash, itching or hives, swelling of the face, lips, or tongue -breathing problems -chest pain or tightness -fast, irregular heartbeat -low blood counts - this medicine may decrease the number of white blood cells, red blood cells  and platelets. You may be at increased risk for infections and bleeding. -seizures -signs and symptoms of bleeding such as bloody or black, tarry stools; red or dark-brown urine; spitting up blood or brown material that looks like coffee grounds; red spots on the skin; unusual bruising or bleeding from the eye, gums, or nose -signs and symptoms of a blood clot such as breathing problems; changes in vision; chest pain; severe, sudden headache; pain, swelling, warmth in the leg; trouble speaking; sudden numbness or weakness of the face, arm or leg -signs and symptoms of liver injury like dark yellow or brown urine; general ill feeling or flu-like symptoms; light-colored stools; loss of appetite; nausea; right upper belly pain; unusually weak or tired; yellowing of the eyes or skin -signs and symptoms of a stroke like changes in vision; confusion; trouble speaking or understanding; severe headaches; sudden numbness or weakness of the face, arm or leg; trouble walking; dizziness; loss of balance or coordination -sweating -vomiting Side effects that usually do not require medical attention (report to your doctor or health care professional if they  continue or are bothersome): -constipation -cough -diarrhea -tiredness This list may not describe all possible side effects. Call your doctor for medical advice about side effects. You may report side effects to FDA at 1-800-FDA-1088. Where should I keep my medicine? Keep out of the reach of children. Store at room temperature between 15 and 30 degrees C (59 and 86 degrees F). Throw away any unused medicine after the expiration date. NOTE: This sheet is a summary. It may not cover all possible information. If you have questions about this medicine, talk to your doctor, pharmacist, or health care provider.    2016, Elsevier/Gold Standard. (2013-10-04 18:30:01) Bortezomib injection What is this medicine? BORTEZOMIB (bor TEZ oh mib) is a medicine that  targets proteins in cancer cells and stops the cancer cells from growing. It is used to treat multiple myeloma and mantle-cell lymphoma. This medicine may be used for other purposes; ask your health care provider or pharmacist if you have questions. What should I tell my health care provider before I take this medicine? They need to know if you have any of these conditions: -diabetes -heart disease -irregular heartbeat -liver disease -on hemodialysis -low blood counts, like low white blood cells, platelets, or hemoglobin -peripheral neuropathy -taking medicine for blood pressure -an unusual or allergic reaction to bortezomib, mannitol, boron, other medicines, foods, dyes, or preservatives -pregnant or trying to get pregnant -breast-feeding How should I use this medicine? This medicine is for injection into a vein or for injection under the skin. It is given by a health care professional in a hospital or clinic setting. Talk to your pediatrician regarding the use of this medicine in children. Special care may be needed. Overdosage: If you think you have taken too much of this medicine contact a poison control center or emergency room at once. NOTE: This medicine is only for you. Do not share this medicine with others. What if I miss a dose? It is important not to miss your dose. Call your doctor or health care professional if you are unable to keep an appointment. What may interact with this medicine? This medicine may interact with the following medications: -ketoconazole -rifampin -ritonavir -St. John's Wort This list may not describe all possible interactions. Give your health care provider a list of all the medicines, herbs, non-prescription drugs, or dietary supplements you use. Also tell them if you smoke, drink alcohol, or use illegal drugs. Some items may interact with your medicine. What should I watch for while using this medicine? Visit your doctor for checks on your progress.  This drug may make you feel generally unwell. This is not uncommon, as chemotherapy can affect healthy cells as well as cancer cells. Report any side effects. Continue your course of treatment even though you feel ill unless your doctor tells you to stop. You may get drowsy or dizzy. Do not drive, use machinery, or do anything that needs mental alertness until you know how this medicine affects you. Do not stand or sit up quickly, especially if you are an older patient. This reduces the risk of dizzy or fainting spells. In some cases, you may be given additional medicines to help with side effects. Follow all directions for their use. Call your doctor or health care professional for advice if you get a fever, chills or sore throat, or other symptoms of a cold or flu. Do not treat yourself. This drug decreases your body's ability to fight infections. Try to avoid being around people who are sick.  This medicine may increase your risk to bruise or bleed. Call your doctor or health care professional if you notice any unusual bleeding. You may need blood work done while you are taking this medicine. In some patients, this medicine may cause a serious brain infection that may cause death. If you have any problems seeing, thinking, speaking, walking, or standing, tell your doctor right away. If you cannot reach your doctor, urgently seek other source of medical care. Do not become pregnant while taking this medicine. Women should inform their doctor if they wish to become pregnant or think they might be pregnant. There is a potential for serious side effects to an unborn child. Talk to your health care professional or pharmacist for more information. Do not breast-feed an infant while taking this medicine. Check with your doctor or health care professional if you get an attack of severe diarrhea, nausea and vomiting, or if you sweat a lot. The loss of too much body fluid can make it dangerous for you to take this  medicine. What side effects may I notice from receiving this medicine? Side effects that you should report to your doctor or health care professional as soon as possible: -allergic reactions like skin rash, itching or hives, swelling of the face, lips, or tongue -breathing problems -changes in hearing -changes in vision -fast, irregular heartbeat -feeling faint or lightheaded, falls -pain, tingling, numbness in the hands or feet -right upper belly pain -seizures -swelling of the ankles, feet, hands -unusual bleeding or bruising -unusually weak or tired -vomiting -yellowing of the eyes or skin Side effects that usually do not require medical attention (report to your doctor or health care professional if they continue or are bothersome): -changes in emotions or moods -constipation -diarrhea -loss of appetite -headache -irritation at site where injected -nausea This list may not describe all possible side effects. Call your doctor for medical advice about side effects. You may report side effects to FDA at 1-800-FDA-1088. Where should I keep my medicine? This drug is given in a hospital or clinic and will not be stored at home. NOTE: This sheet is a summary. It may not cover all possible information. If you have questions about this medicine, talk to your doctor, pharmacist, or health care provider.    2016, Elsevier/Gold Standard. (2014-08-29 14:47:04)

## 2016-02-22 NOTE — Progress Notes (Signed)
Hematology and Oncology Follow Up Visit  Jorge [name] [id number] September 20, 1955 60 y.o. 02/22/2016   Principle Diagnosis:   IgG Kappa myeloma - Hyperdiploid/+11  Current Therapy:    To start RVD on 02/27/2016  Zometa 4 mg IV q month     Interim History:  Jorge [name] is back for follow-up. He definitely has myeloma. We did do a bone marrow biopsy on him. This is done on August 1. The pathology report ([id number]) showed myeloma. He has 70% plasma cells in the marrow.  Cytogenetics were done. He basically has a hyperdiploid line of cells. He also has a trisomy 11. I will like to think that this would be considered favorable.  He had a PET scan which did show some hyperactivity with some bone lesions.  His 24 hour urine only showed 4 mg of Kappa Lightchain.  He did have another vertebroplasty. Hopefully, this has given him a little bit more help and decreased pain. He is on a Duragesic patch which is helping.  He's had no problems with bleeding. He's had no fever. He's had no cough or shortness of breath.  Overall, his performance status is ECOG 1.  Medications:  Current Outpatient Prescriptions:  .  carisoprodol (SOMA) 350 MG tablet, Take 1 tablet (350 mg total) by mouth 4 (four) times daily as needed for muscle spasms., Disp: 90 tablet, Rfl: 3 .  fentaNYL (DURAGESIC - DOSED MCG/HR) 25 MCG/HR patch, Place 1 patch (25 mcg total) onto the skin every 3 (three) days., Disp: 10 patch, Rfl: 0 .  HYDROcodone-acetaminophen (NORCO) 10-325 MG tablet, Take 0.5-1 tablets by mouth every 8 (eight) hours as needed for moderate pain., Disp: 60 tablet, Rfl: 0 .  ibuprofen (ADVIL,MOTRIN) 400 MG tablet, Take 400 mg by mouth every 6 (six) hours as needed for mild pain. , Disp: , Rfl:  .  magnesium oxide (MAG-OX) 400 MG tablet, Take 400 mg by mouth daily., Disp: , Rfl:  .  prochlorperazine (COMPAZINE) 10 MG tablet, Take 1 tablet (10 mg total) by mouth every 6 (six) hours as needed for nausea or vomiting.,  Disp: 30 tablet, Rfl: 3 .  sertraline (ZOLOFT) 100 MG tablet, Take 100 mg by mouth daily., Disp: , Rfl: 2 .  VITAMIN D-VITAMIN K PO, Take 10,000 Units by mouth every other day. , Disp: , Rfl:  .  zolpidem (AMBIEN) 10 MG tablet, Take 5-10 mg by mouth at bedtime as needed for sleep. , Disp: , Rfl: 3 .  aspirin EC 325 MG tablet, Take 1 tablet (325 mg total) by mouth daily., Disp: 30 tablet, Rfl: 8 .  dexamethasone (DECADRON) 4 MG tablet, Take 5 pills at one time with food every week, Disp: 60 tablet, Rfl: 4 .  famciclovir (FAMVIR) 500 MG tablet, Take 1 tablet (500 mg total) by mouth daily., Disp: 30 tablet, Rfl: 8 .  lenalidomide (REVLIMID) 25 MG capsule, Take 1 capsule daily for 21 days on rthen 7 days 59f, Disp: 21 capsule, Rfl: 6  Allergies: No Known Allergies  Past Medical History, Surgical history, Social history, and Family History were reviewed and updated.  Review of Systems: As above  Physical Exam:  height is '5\' 10"'  (1.778 m) and weight is 181 lb (82.1 kg). His oral temperature is 97.4 F (36.3 C). His blood pressure is 143/76 (abnormal) and his pulse is 75. His respiration is 18.   Wt Readings from Last 3 Encounters:  02/22/16 181 lb (82.1 kg)  02/15/16 185 lb (83.9 kg)  01/25/16  185 lb (83.9 kg)     Head and neck exam shows no ocular or oral lesions. He has no palpable cervical or supraclavicular lymph nodes. He has no palpable thyroid. Lungs are clear bilaterally. Cardiac exam regular rate and rhythm with no murmurs, rubs or bruits. Abdomen is soft. He has good bowel sounds. There is no fluid wave. There is no palpable liver or spleen tip. Back exam shows some tenderness in the midthoracic spine. There is some tenderness along the right thoracic paravertebral muscles. Extremities shows no clubbing, cyanosis or edema. He has good range of motion of his joints. He has good strength in his extremities. Neurological exam shows no focal neurological deficits. Skin exam shows no  rashes, ecchymoses or petechia.   Lab Results  Component Value Date   WBC 5.4 02/22/2016   HGB 13.3 02/22/2016   HCT 39.2 02/22/2016   MCV 104 (H) 02/22/2016   PLT 229 02/22/2016     Chemistry      Component Value Date/Time   NA 136 02/22/2016 0804   K 3.8 02/22/2016 0804   CL 105 02/15/2016 1000   CL 102 02/08/2016 1420   CO2 22 02/22/2016 0804   BUN 16.4 02/22/2016 0804   CREATININE 1.0 02/22/2016 0804      Component Value Date/Time   CALCIUM 9.8 02/22/2016 0804   ALKPHOS 95 02/22/2016 0804   AST 19 02/22/2016 0804   ALT 24 02/22/2016 0804   BILITOT 0.41 02/22/2016 0804         Impression and Plan: Jorge [name] is a 60 year old white male. He actually looks to be in good shape. He has IgG Kappa Myeloma.  We will have to get started on systemic therapy. Hope, we can hold off on radiation therapy for the back.  I think he would be a good candidate for Revlimid/Velcade/Decadron. I think with this combination, we should have a 90% chance of getting him into a very good partial remission or maybe even a complete remission.  I talked to he and his wife about the chemotherapy protocol. I went over the side effects of treatment. I splinted him that with Revlimid, there can be a risk of blood clots in the leg. I told him to start taking a full dose aspirin daily.  With Velcade, he is at a high risk for shingles. I did go ahead and put him on Famvir 500 mg by mouth daily.  He may have some fatigue. He may have some diarrhea or constipation. He may have a rash from the Revlimid. He may have some cough.  Overall, I would think that he should be a tolerate treatment pretty well.  We can easily monitor his response by his myeloma studies.  I think that he ultimately will need a stem cell transplant. I talked to his wife about this. They are fully aware that transplant is probably the only way that we are going to potentially cure him.  Overall, he is still in great shape. I  really think that he should do well.  We will be started next week.  He will also need Zometa. I will give this once every 4 weeks.  I will plan to see him back myself the start of his second cycle of treatment in about 4 weeks.  I spent about 45 minutes with he and his wife. I answered all their questions. They want to know about supplements. I don't have any problem with him taking special supplements. I told him that  there is no rigorous trials that have looked at supplements with myeloma therapy but I don't think that there would be a negative affect.     [name], MD 8/11/20174:32 PM

## 2016-02-22 NOTE — Progress Notes (Signed)
Jorge [name] was educated on his upcoming chemotherapy regimen (Velcade, Zometa, and Revlimid). Consents were signed. His wife was present during the education session. He verbalized understanding of the treatment regimen, reasons for treatment as explained by provider, signs and symptoms, when to contact the office, home medication regimen, patient surveys with Rev Assist, and the importance of keeping all scheduled appointments.

## 2016-02-25 ENCOUNTER — Encounter: Payer: Self-pay | Admitting: Hematology & Oncology

## 2016-02-26 ENCOUNTER — Encounter (HOSPITAL_COMMUNITY): Payer: Self-pay | Admitting: Interventional Radiology

## 2016-02-26 ENCOUNTER — Other Ambulatory Visit: Payer: Self-pay | Admitting: Nurse Practitioner

## 2016-02-26 DIAGNOSIS — C9000 Multiple myeloma not having achieved remission: Secondary | ICD-10-CM

## 2016-02-27 ENCOUNTER — Other Ambulatory Visit (HOSPITAL_BASED_OUTPATIENT_CLINIC_OR_DEPARTMENT_OTHER): Payer: 59

## 2016-02-27 ENCOUNTER — Ambulatory Visit (HOSPITAL_BASED_OUTPATIENT_CLINIC_OR_DEPARTMENT_OTHER): Payer: 59

## 2016-02-27 VITALS — BP 129/79 | HR 76 | Temp 97.6°F | Resp 18

## 2016-02-27 DIAGNOSIS — Z5112 Encounter for antineoplastic immunotherapy: Secondary | ICD-10-CM | POA: Diagnosis not present

## 2016-02-27 DIAGNOSIS — C9000 Multiple myeloma not having achieved remission: Secondary | ICD-10-CM

## 2016-02-27 LAB — CBC WITH DIFFERENTIAL (CANCER CENTER ONLY)
BASO#: 0 10*3/uL (ref 0.0–0.2)
BASO%: 0.4 % (ref 0.0–2.0)
EOS ABS: 0.1 10*3/uL (ref 0.0–0.5)
EOS%: 2.6 % (ref 0.0–7.0)
HEMATOCRIT: 36.7 % — AB (ref 38.7–49.9)
HGB: 12.6 g/dL — ABNORMAL LOW (ref 13.0–17.1)
LYMPH#: 1.7 10*3/uL (ref 0.9–3.3)
LYMPH%: 32.2 % (ref 14.0–48.0)
MCH: 35 pg — AB (ref 28.0–33.4)
MCHC: 34.3 g/dL (ref 32.0–35.9)
MCV: 102 fL — AB (ref 82–98)
MONO#: 0.4 10*3/uL (ref 0.1–0.9)
MONO%: 8 % (ref 0.0–13.0)
NEUT#: 3.1 10*3/uL (ref 1.5–6.5)
NEUT%: 56.8 % (ref 40.0–80.0)
PLATELETS: 239 10*3/uL (ref 145–400)
RBC: 3.6 10*6/uL — ABNORMAL LOW (ref 4.20–5.70)
RDW: 13.6 % (ref 11.1–15.7)
WBC: 5.4 10*3/uL (ref 4.0–10.0)

## 2016-02-27 LAB — CMP (CANCER CENTER ONLY)
ALK PHOS: 89 U/L — AB (ref 26–84)
ALT(SGPT): 28 U/L (ref 10–47)
AST: 22 U/L (ref 11–38)
Albumin: 3.4 g/dL (ref 3.3–5.5)
BUN: 17 mg/dL (ref 7–22)
CALCIUM: 9.5 mg/dL (ref 8.0–10.3)
CHLORIDE: 107 meq/L (ref 98–108)
CO2: 23 mEq/L (ref 18–33)
Creat: 1.1 mg/dl (ref 0.6–1.2)
GLUCOSE: 122 mg/dL — AB (ref 73–118)
POTASSIUM: 4 meq/L (ref 3.3–4.7)
Sodium: 130 mEq/L (ref 128–145)
Total Bilirubin: 0.7 mg/dl (ref 0.20–1.60)
Total Protein: 9.8 g/dL — ABNORMAL HIGH (ref 6.4–8.1)

## 2016-02-27 LAB — TECHNOLOGIST REVIEW CHCC SATELLITE

## 2016-02-27 MED ORDER — LIDOCAINE-PRILOCAINE 2.5-2.5 % EX CREA: TOPICAL_CREAM | CUTANEOUS | 3 refills | Status: DC

## 2016-02-27 MED ORDER — PROCHLORPERAZINE MALEATE 10 MG PO TABS
10.0000 mg | ORAL_TABLET | Freq: Once | ORAL | Status: AC
  Administered 2016-02-27: 10 mg via ORAL

## 2016-02-27 MED ORDER — LORAZEPAM 0.5 MG PO TABS: 0.5000 mg | ORAL_TABLET | Freq: Four times a day (QID) | ORAL | 0 refills | Status: DC | PRN

## 2016-02-27 MED ORDER — ZOLEDRONIC ACID 4 MG/100ML IV SOLN
4.0000 mg | Freq: Once | INTRAVENOUS | Status: AC
  Administered 2016-02-27: 4 mg via INTRAVENOUS
  Filled 2016-02-27: qty 100

## 2016-02-27 MED ORDER — PROCHLORPERAZINE MALEATE 10 MG PO TABS
ORAL_TABLET | ORAL | Status: AC
  Filled 2016-02-27: qty 1

## 2016-02-27 MED ORDER — PROCHLORPERAZINE MALEATE 10 MG PO TABS: 10.0000 mg | ORAL_TABLET | Freq: Four times a day (QID) | ORAL | 1 refills | Status: DC | PRN

## 2016-02-27 MED ORDER — BORTEZOMIB CHEMO SQ INJECTION 3.5 MG (2.5MG/ML)
1.3000 mg/m2 | Freq: Once | INTRAMUSCULAR | Status: AC
  Administered 2016-02-27: 2.5 mg via SUBCUTANEOUS
  Filled 2016-02-27: qty 2.5

## 2016-02-27 MED ORDER — ONDANSETRON HCL 8 MG PO TABS: 8.0000 mg | ORAL_TABLET | Freq: Two times a day (BID) | ORAL | 1 refills | Status: DC | PRN

## 2016-02-27 NOTE — Patient Instructions (Signed)
Bortezomib injection What is this medicine? BORTEZOMIB (bor TEZ oh mib) is a medicine that targets proteins in cancer cells and stops the cancer cells from growing. It is used to treat multiple myeloma and mantle-cell lymphoma. This medicine may be used for other purposes; ask your health care provider or pharmacist if you have questions. What should I tell my health care provider before I take this medicine? They need to know if you have any of these conditions: -diabetes -heart disease -irregular heartbeat -liver disease -on hemodialysis -low blood counts, like low white blood cells, platelets, or hemoglobin -peripheral neuropathy -taking medicine for blood pressure -an unusual or allergic reaction to bortezomib, mannitol, boron, other medicines, foods, dyes, or preservatives -pregnant or trying to get pregnant -breast-feeding How should I use this medicine? This medicine is for injection into a vein or for injection under the skin. It is given by a health care professional in a hospital or clinic setting. Talk to your pediatrician regarding the use of this medicine in children. Special care may be needed. Overdosage: If you think you have taken too much of this medicine contact a poison control center or emergency room at once. NOTE: This medicine is only for you. Do not share this medicine with others. What if I miss a dose? It is important not to miss your dose. Call your doctor or health care professional if you are unable to keep an appointment. What may interact with this medicine? This medicine may interact with the following medications: -ketoconazole -rifampin -ritonavir -St. John's Wort This list may not describe all possible interactions. Give your health care provider a list of all the medicines, herbs, non-prescription drugs, or dietary supplements you use. Also tell them if you smoke, drink alcohol, or use illegal drugs. Some items may interact with your medicine. What  should I watch for while using this medicine? Visit your doctor for checks on your progress. This drug may make you feel generally unwell. This is not uncommon, as chemotherapy can affect healthy cells as well as cancer cells. Report any side effects. Continue your course of treatment even though you feel ill unless your doctor tells you to stop. You may get drowsy or dizzy. Do not drive, use machinery, or do anything that needs mental alertness until you know how this medicine affects you. Do not stand or sit up quickly, especially if you are an older patient. This reduces the risk of dizzy or fainting spells. In some cases, you may be given additional medicines to help with side effects. Follow all directions for their use. Call your doctor or health care professional for advice if you get a fever, chills or sore throat, or other symptoms of a cold or flu. Do not treat yourself. This drug decreases your body's ability to fight infections. Try to avoid being around people who are sick. This medicine may increase your risk to bruise or bleed. Call your doctor or health care professional if you notice any unusual bleeding. You may need blood work done while you are taking this medicine. In some patients, this medicine may cause a serious brain infection that may cause death. If you have any problems seeing, thinking, speaking, walking, or standing, tell your doctor right away. If you cannot reach your doctor, urgently seek other source of medical care. Do not become pregnant while taking this medicine. Women should inform their doctor if they wish to become pregnant or think they might be pregnant. There is a potential for serious  side effects to an unborn child. Talk to your health care professional or pharmacist for more information. Do not breast-feed an infant while taking this medicine. Check with your doctor or health care professional if you get an attack of severe diarrhea, nausea and vomiting, or if  you sweat a lot. The loss of too much body fluid can make it dangerous for you to take this medicine. What side effects may I notice from receiving this medicine? Side effects that you should report to your doctor or health care professional as soon as possible: -allergic reactions like skin rash, itching or hives, swelling of the face, lips, or tongue -breathing problems -changes in hearing -changes in vision -fast, irregular heartbeat -feeling faint or lightheaded, falls -pain, tingling, numbness in the hands or feet -right upper belly pain -seizures -swelling of the ankles, feet, hands -unusual bleeding or bruising -unusually weak or tired -vomiting -yellowing of the eyes or skin Side effects that usually do not require medical attention (report to your doctor or health care professional if they continue or are bothersome): -changes in emotions or moods -constipation -diarrhea -loss of appetite -headache -irritation at site where injected -nausea This list may not describe all possible side effects. Call your doctor for medical advice about side effects. You may report side effects to FDA at 1-800-FDA-1088. Where should I keep my medicine? This drug is given in a hospital or clinic and will not be stored at home. NOTE: This sheet is a summary. It may not cover all possible information. If you have questions about this medicine, talk to your doctor, pharmacist, or health care provider.    2016, Elsevier/Gold Standard. (2014-08-29 14:47:04) Zoledronic Acid injection (Hypercalcemia, Oncology) What is this medicine? ZOLEDRONIC ACID (ZOE le dron ik AS id) lowers the amount of calcium loss from bone. It is used to treat too much calcium in your blood from cancer. It is also used to prevent complications of cancer that has spread to the bone. This medicine may be used for other purposes; ask your health care provider or pharmacist if you have questions. What should I tell my health  care provider before I take this medicine? They need to know if you have any of these conditions: -aspirin-sensitive asthma -cancer, especially if you are receiving medicines used to treat cancer -dental disease or wear dentures -infection -kidney disease -receiving corticosteroids like dexamethasone or prednisone -an unusual or allergic reaction to zoledronic acid, other medicines, foods, dyes, or preservatives -pregnant or trying to get pregnant -breast-feeding How should I use this medicine? This medicine is for infusion into a vein. It is given by a health care professional in a hospital or clinic setting. Talk to your pediatrician regarding the use of this medicine in children. Special care may be needed. Overdosage: If you think you have taken too much of this medicine contact a poison control center or emergency room at once. NOTE: This medicine is only for you. Do not share this medicine with others. What if I miss a dose? It is important not to miss your dose. Call your doctor or health care professional if you are unable to keep an appointment. What may interact with this medicine? -certain antibiotics given by injection -NSAIDs, medicines for pain and inflammation, like ibuprofen or naproxen -some diuretics like bumetanide, furosemide -teriparatide -thalidomide This list may not describe all possible interactions. Give your health care provider a list of all the medicines, herbs, non-prescription drugs, or dietary supplements you use. Also tell them   if you smoke, drink alcohol, or use illegal drugs. Some items may interact with your medicine. What should I watch for while using this medicine? Visit your doctor or health care professional for regular checkups. It may be some time before you see the benefit from this medicine. Do not stop taking your medicine unless your doctor tells you to. Your doctor may order blood tests or other tests to see how you are doing. Women should  inform their doctor if they wish to become pregnant or think they might be pregnant. There is a potential for serious side effects to an unborn child. Talk to your health care professional or pharmacist for more information. You should make sure that you get enough calcium and vitamin D while you are taking this medicine. Discuss the foods you eat and the vitamins you take with your health care professional. Some people who take this medicine have severe bone, joint, and/or muscle pain. This medicine may also increase your risk for jaw problems or a broken thigh bone. Tell your doctor right away if you have severe pain in your jaw, bones, joints, or muscles. Tell your doctor if you have any pain that does not go away or that gets worse. Tell your dentist and dental surgeon that you are taking this medicine. You should not have major dental surgery while on this medicine. See your dentist to have a dental exam and fix any dental problems before starting this medicine. Take good care of your teeth while on this medicine. Make sure you see your dentist for regular follow-up appointments. What side effects may I notice from receiving this medicine? Side effects that you should report to your doctor or health care professional as soon as possible: -allergic reactions like skin rash, itching or hives, swelling of the face, lips, or tongue -anxiety, confusion, or depression -breathing problems -changes in vision -eye pain -feeling faint or lightheaded, falls -jaw pain, especially after dental work -mouth sores -muscle cramps, stiffness, or weakness -redness, blistering, peeling or loosening of the skin, including inside the mouth -trouble passing urine or change in the amount of urine Side effects that usually do not require medical attention (report to your doctor or health care professional if they continue or are bothersome): -bone, joint, or muscle pain -constipation -diarrhea -fever -hair  loss -irritation at site where injected -loss of appetite -nausea, vomiting -stomach upset -trouble sleeping -trouble swallowing -weak or tired This list may not describe all possible side effects. Call your doctor for medical advice about side effects. You may report side effects to FDA at 1-800-FDA-1088. Where should I keep my medicine? This drug is given in a hospital or clinic and will not be stored at home. NOTE: This sheet is a summary. It may not cover all possible information. If you have questions about this medicine, talk to your doctor, pharmacist, or health care provider.    2016, Elsevier/Gold Standard. (2013-11-26 14:19:39)

## 2016-02-28 ENCOUNTER — Other Ambulatory Visit: Payer: Self-pay | Admitting: Family

## 2016-02-29 ENCOUNTER — Other Ambulatory Visit: Payer: Self-pay | Admitting: Hematology & Oncology

## 2016-03-05 ENCOUNTER — Encounter (HOSPITAL_COMMUNITY): Payer: Self-pay

## 2016-03-05 ENCOUNTER — Ambulatory Visit (HOSPITAL_COMMUNITY)
Admission: RE | Admit: 2016-03-05 | Discharge: 2016-03-05 | Disposition: A | Discharge status: Home / Self Care | Payer: 59 | Source: Ambulatory Visit | Attending: Interventional Radiology | Admitting: Interventional Radiology

## 2016-03-05 ENCOUNTER — Other Ambulatory Visit (HOSPITAL_BASED_OUTPATIENT_CLINIC_OR_DEPARTMENT_OTHER): Payer: 59

## 2016-03-05 ENCOUNTER — Ambulatory Visit (HOSPITAL_BASED_OUTPATIENT_CLINIC_OR_DEPARTMENT_OTHER): Payer: 59

## 2016-03-05 VITALS — BP 127/69 | HR 83 | Temp 98.0°F | Resp 20

## 2016-03-05 DIAGNOSIS — Z5112 Encounter for antineoplastic immunotherapy: Secondary | ICD-10-CM | POA: Diagnosis not present

## 2016-03-05 DIAGNOSIS — C9000 Multiple myeloma not having achieved remission: Secondary | ICD-10-CM | POA: Diagnosis not present

## 2016-03-05 DIAGNOSIS — IMO0002 Reserved for concepts with insufficient information to code with codable children: Secondary | ICD-10-CM

## 2016-03-05 LAB — CBC WITH DIFFERENTIAL (CANCER CENTER ONLY)
BASO#: 0 10*3/uL (ref 0.0–0.2)
BASO%: 0.2 % (ref 0.0–2.0)
EOS ABS: 0.3 10*3/uL (ref 0.0–0.5)
EOS%: 6.5 % (ref 0.0–7.0)
HEMATOCRIT: 39 % (ref 38.7–49.9)
HEMOGLOBIN: 13.4 g/dL (ref 13.0–17.1)
LYMPH#: 1.3 10*3/uL (ref 0.9–3.3)
LYMPH%: 29.6 % (ref 14.0–48.0)
MCH: 34.6 pg — AB (ref 28.0–33.4)
MCHC: 34.4 g/dL (ref 32.0–35.9)
MCV: 101 fL — ABNORMAL HIGH (ref 82–98)
MONO#: 0.3 10*3/uL (ref 0.1–0.9)
MONO%: 6.3 % (ref 0.0–13.0)
NEUT%: 57.4 % (ref 40.0–80.0)
NEUTROS ABS: 2.6 10*3/uL (ref 1.5–6.5)
Platelets: 233 10*3/uL (ref 145–400)
RBC: 3.87 10*6/uL — ABNORMAL LOW (ref 4.20–5.70)
RDW: 13.4 % (ref 11.1–15.7)
WBC: 4.5 10*3/uL (ref 4.0–10.0)

## 2016-03-05 LAB — CMP (CANCER CENTER ONLY)
ALBUMIN: 3.9 g/dL (ref 3.3–5.5)
ALT(SGPT): 37 U/L (ref 10–47)
AST: 32 U/L (ref 11–38)
Alkaline Phosphatase: 90 U/L — ABNORMAL HIGH (ref 26–84)
BUN, Bld: 13 mg/dL (ref 7–22)
CALCIUM: 8.7 mg/dL (ref 8.0–10.3)
CHLORIDE: 106 meq/L (ref 98–108)
CO2: 20 meq/L (ref 18–33)
Creat: 0.8 mg/dl (ref 0.6–1.2)
GLUCOSE: 118 mg/dL (ref 73–118)
Potassium: 4 mEq/L (ref 3.3–4.7)
Sodium: 131 mEq/L (ref 128–145)
Total Bilirubin: 1 mg/dl (ref 0.20–1.60)
Total Protein: 10.2 g/dL — ABNORMAL HIGH (ref 6.4–8.1)

## 2016-03-05 LAB — TECHNOLOGIST REVIEW CHCC SATELLITE

## 2016-03-05 MED ORDER — BORTEZOMIB CHEMO SQ INJECTION 3.5 MG (2.5MG/ML)
1.3000 mg/m2 | Freq: Once | INTRAMUSCULAR | Status: AC
  Administered 2016-03-05: 2.5 mg via SUBCUTANEOUS
  Filled 2016-03-05: qty 2.5

## 2016-03-05 MED ORDER — PROCHLORPERAZINE MALEATE 10 MG PO TABS
10.0000 mg | ORAL_TABLET | Freq: Once | ORAL | Status: AC
  Administered 2016-03-05: 10 mg via ORAL

## 2016-03-05 MED ORDER — PROCHLORPERAZINE MALEATE 10 MG PO TABS
ORAL_TABLET | ORAL | Status: AC
  Filled 2016-03-05: qty 1

## 2016-03-05 NOTE — Progress Notes (Signed)
Patient confirmed that he received Revlimid and did began taking on last Thursday.

## 2016-03-05 NOTE — Patient Instructions (Signed)
Bortezomib injection What is this medicine? BORTEZOMIB (bor TEZ oh mib) is a medicine that targets proteins in cancer cells and stops the cancer cells from growing. It is used to treat multiple myeloma and mantle-cell lymphoma. This medicine may be used for other purposes; ask your health care provider or pharmacist if you have questions. What should I tell my health care provider before I take this medicine? They need to know if you have any of these conditions: -diabetes -heart disease -irregular heartbeat -liver disease -on hemodialysis -low blood counts, like low white blood cells, platelets, or hemoglobin -peripheral neuropathy -taking medicine for blood pressure -an unusual or allergic reaction to bortezomib, mannitol, boron, other medicines, foods, dyes, or preservatives -pregnant or trying to get pregnant -breast-feeding How should I use this medicine? This medicine is for injection into a vein or for injection under the skin. It is given by a health care professional in a hospital or clinic setting. Talk to your pediatrician regarding the use of this medicine in children. Special care may be needed. Overdosage: If you think you have taken too much of this medicine contact a poison control center or emergency room at once. NOTE: This medicine is only for you. Do not share this medicine with others. What if I miss a dose? It is important not to miss your dose. Call your doctor or health care professional if you are unable to keep an appointment. What may interact with this medicine? This medicine may interact with the following medications: -ketoconazole -rifampin -ritonavir -St. John's Wort This list may not describe all possible interactions. Give your health care provider a list of all the medicines, herbs, non-prescription drugs, or dietary supplements you use. Also tell them if you smoke, drink alcohol, or use illegal drugs. Some items may interact with your medicine. What  should I watch for while using this medicine? Visit your doctor for checks on your progress. This drug may make you feel generally unwell. This is not uncommon, as chemotherapy can affect healthy cells as well as cancer cells. Report any side effects. Continue your course of treatment even though you feel ill unless your doctor tells you to stop. You may get drowsy or dizzy. Do not drive, use machinery, or do anything that needs mental alertness until you know how this medicine affects you. Do not stand or sit up quickly, especially if you are an older patient. This reduces the risk of dizzy or fainting spells. In some cases, you may be given additional medicines to help with side effects. Follow all directions for their use. Call your doctor or health care professional for advice if you get a fever, chills or sore throat, or other symptoms of a cold or flu. Do not treat yourself. This drug decreases your body's ability to fight infections. Try to avoid being around people who are sick. This medicine may increase your risk to bruise or bleed. Call your doctor or health care professional if you notice any unusual bleeding. You may need blood work done while you are taking this medicine. In some patients, this medicine may cause a serious brain infection that may cause death. If you have any problems seeing, thinking, speaking, walking, or standing, tell your doctor right away. If you cannot reach your doctor, urgently seek other source of medical care. Do not become pregnant while taking this medicine. Women should inform their doctor if they wish to become pregnant or think they might be pregnant. There is a potential for serious  side effects to an unborn child. Talk to your health care professional or pharmacist for more information. Do not breast-feed an infant while taking this medicine. Check with your doctor or health care professional if you get an attack of severe diarrhea, nausea and vomiting, or if  you sweat a lot. The loss of too much body fluid can make it dangerous for you to take this medicine. What side effects may I notice from receiving this medicine? Side effects that you should report to your doctor or health care professional as soon as possible: -allergic reactions like skin rash, itching or hives, swelling of the face, lips, or tongue -breathing problems -changes in hearing -changes in vision -fast, irregular heartbeat -feeling faint or lightheaded, falls -pain, tingling, numbness in the hands or feet -right upper belly pain -seizures -swelling of the ankles, feet, hands -unusual bleeding or bruising -unusually weak or tired -vomiting -yellowing of the eyes or skin Side effects that usually do not require medical attention (report to your doctor or health care professional if they continue or are bothersome): -changes in emotions or moods -constipation -diarrhea -loss of appetite -headache -irritation at site where injected -nausea This list may not describe all possible side effects. Call your doctor for medical advice about side effects. You may report side effects to FDA at 1-800-FDA-1088. Where should I keep my medicine? This drug is given in a hospital or clinic and will not be stored at home. NOTE: This sheet is a summary. It may not cover all possible information. If you have questions about this medicine, talk to your doctor, pharmacist, or health care provider.    2016, Elsevier/Gold Standard. (2014-08-29 14:47:04) Zoledronic Acid injection (Hypercalcemia, Oncology) What is this medicine? ZOLEDRONIC ACID (ZOE le dron ik AS id) lowers the amount of calcium loss from bone. It is used to treat too much calcium in your blood from cancer. It is also used to prevent complications of cancer that has spread to the bone. This medicine may be used for other purposes; ask your health care provider or pharmacist if you have questions. What should I tell my health  care provider before I take this medicine? They need to know if you have any of these conditions: -aspirin-sensitive asthma -cancer, especially if you are receiving medicines used to treat cancer -dental disease or wear dentures -infection -kidney disease -receiving corticosteroids like dexamethasone or prednisone -an unusual or allergic reaction to zoledronic acid, other medicines, foods, dyes, or preservatives -pregnant or trying to get pregnant -breast-feeding How should I use this medicine? This medicine is for infusion into a vein. It is given by a health care professional in a hospital or clinic setting. Talk to your pediatrician regarding the use of this medicine in children. Special care may be needed. Overdosage: If you think you have taken too much of this medicine contact a poison control center or emergency room at once. NOTE: This medicine is only for you. Do not share this medicine with others. What if I miss a dose? It is important not to miss your dose. Call your doctor or health care professional if you are unable to keep an appointment. What may interact with this medicine? -certain antibiotics given by injection -NSAIDs, medicines for pain and inflammation, like ibuprofen or naproxen -some diuretics like bumetanide, furosemide -teriparatide -thalidomide This list may not describe all possible interactions. Give your health care provider a list of all the medicines, herbs, non-prescription drugs, or dietary supplements you use. Also tell them   if you smoke, drink alcohol, or use illegal drugs. Some items may interact with your medicine. What should I watch for while using this medicine? Visit your doctor or health care professional for regular checkups. It may be some time before you see the benefit from this medicine. Do not stop taking your medicine unless your doctor tells you to. Your doctor may order blood tests or other tests to see how you are doing. Women should  inform their doctor if they wish to become pregnant or think they might be pregnant. There is a potential for serious side effects to an unborn child. Talk to your health care professional or pharmacist for more information. You should make sure that you get enough calcium and vitamin D while you are taking this medicine. Discuss the foods you eat and the vitamins you take with your health care professional. Some people who take this medicine have severe bone, joint, and/or muscle pain. This medicine may also increase your risk for jaw problems or a broken thigh bone. Tell your doctor right away if you have severe pain in your jaw, bones, joints, or muscles. Tell your doctor if you have any pain that does not go away or that gets worse. Tell your dentist and dental surgeon that you are taking this medicine. You should not have major dental surgery while on this medicine. See your dentist to have a dental exam and fix any dental problems before starting this medicine. Take good care of your teeth while on this medicine. Make sure you see your dentist for regular follow-up appointments. What side effects may I notice from receiving this medicine? Side effects that you should report to your doctor or health care professional as soon as possible: -allergic reactions like skin rash, itching or hives, swelling of the face, lips, or tongue -anxiety, confusion, or depression -breathing problems -changes in vision -eye pain -feeling faint or lightheaded, falls -jaw pain, especially after dental work -mouth sores -muscle cramps, stiffness, or weakness -redness, blistering, peeling or loosening of the skin, including inside the mouth -trouble passing urine or change in the amount of urine Side effects that usually do not require medical attention (report to your doctor or health care professional if they continue or are bothersome): -bone, joint, or muscle pain -constipation -diarrhea -fever -hair  loss -irritation at site where injected -loss of appetite -nausea, vomiting -stomach upset -trouble sleeping -trouble swallowing -weak or tired This list may not describe all possible side effects. Call your doctor for medical advice about side effects. You may report side effects to FDA at 1-800-FDA-1088. Where should I keep my medicine? This drug is given in a hospital or clinic and will not be stored at home. NOTE: This sheet is a summary. It may not cover all possible information. If you have questions about this medicine, talk to your doctor, pharmacist, or health care provider.    2016, Elsevier/Gold Standard. (2013-11-26 14:19:39)

## 2016-03-07 ENCOUNTER — Ambulatory Visit (HOSPITAL_COMMUNITY)
Admission: RE | Admit: 2016-03-07 | Discharge: 2016-03-07 | Disposition: A | Discharge status: Home / Self Care | Payer: 59 | Source: Ambulatory Visit | Attending: Interventional Radiology | Admitting: Interventional Radiology

## 2016-03-07 ENCOUNTER — Other Ambulatory Visit (HOSPITAL_COMMUNITY): Payer: Self-pay | Admitting: Interventional Radiology

## 2016-03-07 DIAGNOSIS — M4856XA Collapsed vertebra, not elsewhere classified, lumbar region, initial encounter for fracture: Secondary | ICD-10-CM | POA: Insufficient documentation

## 2016-03-07 DIAGNOSIS — M47896 Other spondylosis, lumbar region: Secondary | ICD-10-CM | POA: Insufficient documentation

## 2016-03-07 DIAGNOSIS — M545 Low back pain, unspecified: Secondary | ICD-10-CM

## 2016-03-07 DIAGNOSIS — M5137 Other intervertebral disc degeneration, lumbosacral region: Secondary | ICD-10-CM | POA: Insufficient documentation

## 2016-03-07 DIAGNOSIS — K5730 Diverticulosis of large intestine without perforation or abscess without bleeding: Secondary | ICD-10-CM | POA: Diagnosis not present

## 2016-03-07 DIAGNOSIS — K449 Diaphragmatic hernia without obstruction or gangrene: Secondary | ICD-10-CM | POA: Insufficient documentation

## 2016-03-07 DIAGNOSIS — C9000 Multiple myeloma not having achieved remission: Secondary | ICD-10-CM | POA: Insufficient documentation

## 2016-03-07 DIAGNOSIS — IMO0002 Reserved for concepts with insufficient information to code with codable children: Secondary | ICD-10-CM

## 2016-03-07 DIAGNOSIS — N200 Calculus of kidney: Secondary | ICD-10-CM | POA: Insufficient documentation

## 2016-03-07 DIAGNOSIS — M4854XA Collapsed vertebra, not elsewhere classified, thoracic region, initial encounter for fracture: Secondary | ICD-10-CM | POA: Insufficient documentation

## 2016-03-07 DIAGNOSIS — R918 Other nonspecific abnormal finding of lung field: Secondary | ICD-10-CM | POA: Insufficient documentation

## 2016-03-07 DIAGNOSIS — J9811 Atelectasis: Secondary | ICD-10-CM | POA: Diagnosis not present

## 2016-03-07 DIAGNOSIS — I517 Cardiomegaly: Secondary | ICD-10-CM | POA: Insufficient documentation

## 2016-03-10 ENCOUNTER — Other Ambulatory Visit: Payer: Self-pay | Admitting: Radiology

## 2016-03-10 ENCOUNTER — Other Ambulatory Visit (HOSPITAL_COMMUNITY): Payer: Self-pay | Admitting: Interventional Radiology

## 2016-03-10 ENCOUNTER — Other Ambulatory Visit: Payer: Self-pay | Admitting: Nurse Practitioner

## 2016-03-10 DIAGNOSIS — C9000 Multiple myeloma not having achieved remission: Secondary | ICD-10-CM

## 2016-03-10 DIAGNOSIS — IMO0002 Reserved for concepts with insufficient information to code with codable children: Secondary | ICD-10-CM

## 2016-03-10 DIAGNOSIS — D472 Monoclonal gammopathy: Secondary | ICD-10-CM

## 2016-03-10 MED ORDER — FENTANYL 25 MCG/HR TD PT72: 25.0000 ug | MEDICATED_PATCH | TRANSDERMAL | 0 refills | Status: DC

## 2016-03-10 MED ORDER — ZOLPIDEM TARTRATE 10 MG PO TABS: 10.0000 mg | ORAL_TABLET | Freq: Every evening | ORAL | 1 refills | Status: DC | PRN

## 2016-03-11 ENCOUNTER — Encounter (HOSPITAL_COMMUNITY): Payer: Self-pay

## 2016-03-12 ENCOUNTER — Other Ambulatory Visit: Payer: 59

## 2016-03-12 ENCOUNTER — Ambulatory Visit (HOSPITAL_BASED_OUTPATIENT_CLINIC_OR_DEPARTMENT_OTHER): Payer: 59

## 2016-03-12 ENCOUNTER — Other Ambulatory Visit: Payer: Self-pay | Admitting: General Surgery

## 2016-03-12 ENCOUNTER — Encounter (HOSPITAL_COMMUNITY): Payer: Self-pay

## 2016-03-12 ENCOUNTER — Ambulatory Visit: Payer: 59 | Admitting: Physician Assistant

## 2016-03-12 ENCOUNTER — Encounter (HOSPITAL_COMMUNITY)
Admission: RE | Admit: 2016-03-12 | Discharge: 2016-03-12 | Disposition: A | Discharge status: Home / Self Care | Payer: 59 | Source: Ambulatory Visit | Attending: Interventional Radiology | Admitting: Interventional Radiology

## 2016-03-12 VITALS — BP 138/80 | HR 69 | Temp 97.7°F | Resp 20 | Wt 175.4 lb

## 2016-03-12 DIAGNOSIS — M8458XA Pathological fracture in neoplastic disease, other specified site, initial encounter for fracture: Secondary | ICD-10-CM | POA: Diagnosis present

## 2016-03-12 DIAGNOSIS — C9000 Multiple myeloma not having achieved remission: Secondary | ICD-10-CM

## 2016-03-12 DIAGNOSIS — Z5112 Encounter for antineoplastic immunotherapy: Secondary | ICD-10-CM | POA: Diagnosis not present

## 2016-03-12 DIAGNOSIS — Z87891 Personal history of nicotine dependence: Secondary | ICD-10-CM | POA: Diagnosis not present

## 2016-03-12 HISTORY — DX: Depression, unspecified: F32.A

## 2016-03-12 HISTORY — DX: Major depressive disorder, single episode, unspecified: F32.9

## 2016-03-12 LAB — CBC WITH DIFFERENTIAL/PLATELET
BASOS ABS: 0 10*3/uL (ref 0.0–0.1)
Basophils Relative: 0 %
Eosinophils Absolute: 0.2 10*3/uL (ref 0.0–0.7)
Eosinophils Relative: 3 %
HEMATOCRIT: 38.4 % — AB (ref 39.0–52.0)
HEMOGLOBIN: 13 g/dL (ref 13.0–17.0)
LYMPHS PCT: 23 %
Lymphs Abs: 1.5 10*3/uL (ref 0.7–4.0)
MCH: 33.9 pg (ref 26.0–34.0)
MCHC: 33.9 g/dL (ref 30.0–36.0)
MCV: 100.3 fL — AB (ref 78.0–100.0)
MONOS PCT: 7 %
Monocytes Absolute: 0.5 10*3/uL (ref 0.1–1.0)
Neutro Abs: 4.3 10*3/uL (ref 1.7–7.7)
Neutrophils Relative %: 67 %
Platelets: 188 10*3/uL (ref 150–400)
RBC: 3.83 MIL/uL — AB (ref 4.22–5.81)
RDW: 13.9 % (ref 11.5–15.5)
WBC: 6.5 10*3/uL (ref 4.0–10.5)

## 2016-03-12 LAB — BASIC METABOLIC PANEL
ANION GAP: 8 (ref 5–15)
BUN: 14 mg/dL (ref 6–20)
CALCIUM: 9.3 mg/dL (ref 8.9–10.3)
CO2: 20 mmol/L — AB (ref 22–32)
Chloride: 102 mmol/L (ref 101–111)
Creatinine, Ser: 0.74 mg/dL (ref 0.61–1.24)
GLUCOSE: 99 mg/dL (ref 65–99)
POTASSIUM: 4.4 mmol/L (ref 3.5–5.1)
Sodium: 130 mmol/L — ABNORMAL LOW (ref 135–145)

## 2016-03-12 LAB — SURGICAL PCR SCREEN
MRSA, PCR: NEGATIVE
STAPHYLOCOCCUS AUREUS: NEGATIVE

## 2016-03-12 LAB — PROTIME-INR
INR: 1.14
Prothrombin Time: 14.6 seconds (ref 11.4–15.2)

## 2016-03-12 LAB — APTT: APTT: 26 s (ref 24–36)

## 2016-03-12 MED ORDER — BORTEZOMIB CHEMO SQ INJECTION 3.5 MG (2.5MG/ML)
1.3000 mg/m2 | Freq: Once | INTRAMUSCULAR | Status: AC
  Administered 2016-03-12: 2.5 mg via SUBCUTANEOUS
  Filled 2016-03-12: qty 2.5

## 2016-03-12 MED ORDER — PROCHLORPERAZINE MALEATE 10 MG PO TABS
10.0000 mg | ORAL_TABLET | Freq: Once | ORAL | Status: AC
  Administered 2016-03-12: 10 mg via ORAL

## 2016-03-12 MED ORDER — PROCHLORPERAZINE MALEATE 10 MG PO TABS
ORAL_TABLET | ORAL | Status: AC
  Filled 2016-03-12: qty 1

## 2016-03-12 NOTE — Progress Notes (Signed)
PCP is Dr. Orpah Melter, but is getting ready to move to [place]. Denies ever seeing a cardiologist. Denies ever having a stress test, card cath, or echo.

## 2016-03-12 NOTE — Patient Instructions (Signed)
Bortezomib injection What is this medicine? BORTEZOMIB (bor TEZ oh mib) is a medicine that targets proteins in cancer cells and stops the cancer cells from growing. It is used to treat multiple myeloma and mantle-cell lymphoma. This medicine may be used for other purposes; ask your health care provider or pharmacist if you have questions. What should I tell my health care provider before I take this medicine? They need to know if you have any of these conditions: -diabetes -heart disease -irregular heartbeat -liver disease -on hemodialysis -low blood counts, like low white blood cells, platelets, or hemoglobin -peripheral neuropathy -taking medicine for blood pressure -an unusual or allergic reaction to bortezomib, mannitol, boron, other medicines, foods, dyes, or preservatives -pregnant or trying to get pregnant -breast-feeding How should I use this medicine? This medicine is for injection into a vein or for injection under the skin. It is given by a health care professional in a hospital or clinic setting. Talk to your pediatrician regarding the use of this medicine in children. Special care may be needed. Overdosage: If you think you have taken too much of this medicine contact a poison control center or emergency room at once. NOTE: This medicine is only for you. Do not share this medicine with others. What if I miss a dose? It is important not to miss your dose. Call your doctor or health care professional if you are unable to keep an appointment. What may interact with this medicine? This medicine may interact with the following medications: -ketoconazole -rifampin -ritonavir -St. John's Wort This list may not describe all possible interactions. Give your health care provider a list of all the medicines, herbs, non-prescription drugs, or dietary supplements you use. Also tell them if you smoke, drink alcohol, or use illegal drugs. Some items may interact with your medicine. What  should I watch for while using this medicine? Visit your doctor for checks on your progress. This drug may make you feel generally unwell. This is not uncommon, as chemotherapy can affect healthy cells as well as cancer cells. Report any side effects. Continue your course of treatment even though you feel ill unless your doctor tells you to stop. You may get drowsy or dizzy. Do not drive, use machinery, or do anything that needs mental alertness until you know how this medicine affects you. Do not stand or sit up quickly, especially if you are an older patient. This reduces the risk of dizzy or fainting spells. In some cases, you may be given additional medicines to help with side effects. Follow all directions for their use. Call your doctor or health care professional for advice if you get a fever, chills or sore throat, or other symptoms of a cold or flu. Do not treat yourself. This drug decreases your body's ability to fight infections. Try to avoid being around people who are sick. This medicine may increase your risk to bruise or bleed. Call your doctor or health care professional if you notice any unusual bleeding. You may need blood work done while you are taking this medicine. In some patients, this medicine may cause a serious brain infection that may cause death. If you have any problems seeing, thinking, speaking, walking, or standing, tell your doctor right away. If you cannot reach your doctor, urgently seek other source of medical care. Do not become pregnant while taking this medicine. Women should inform their doctor if they wish to become pregnant or think they might be pregnant. There is a potential for serious  side effects to an unborn child. Talk to your health care professional or pharmacist for more information. Do not breast-feed an infant while taking this medicine. Check with your doctor or health care professional if you get an attack of severe diarrhea, nausea and vomiting, or if  you sweat a lot. The loss of too much body fluid can make it dangerous for you to take this medicine. What side effects may I notice from receiving this medicine? Side effects that you should report to your doctor or health care professional as soon as possible: -allergic reactions like skin rash, itching or hives, swelling of the face, lips, or tongue -breathing problems -changes in hearing -changes in vision -fast, irregular heartbeat -feeling faint or lightheaded, falls -pain, tingling, numbness in the hands or feet -right upper belly pain -seizures -swelling of the ankles, feet, hands -unusual bleeding or bruising -unusually weak or tired -vomiting -yellowing of the eyes or skin Side effects that usually do not require medical attention (report to your doctor or health care professional if they continue or are bothersome): -changes in emotions or moods -constipation -diarrhea -loss of appetite -headache -irritation at site where injected -nausea This list may not describe all possible side effects. Call your doctor for medical advice about side effects. You may report side effects to FDA at 1-800-FDA-1088. Where should I keep my medicine? This drug is given in a hospital or clinic and will not be stored at home. NOTE: This sheet is a summary. It may not cover all possible information. If you have questions about this medicine, talk to your doctor, pharmacist, or health care provider.    2016, Elsevier/Gold Standard. (2014-08-29 14:47:04)

## 2016-03-12 NOTE — Pre-Procedure Instructions (Signed)
Jorge [name]  03/12/2016      [id number] - [address], [place] - [place] [place] [address] Phone: [phone] Fax: [facility], [place] - [address] [place] [address] Phone: [phone] Fax: [phone]    Your procedure is scheduled on Aug 31  Report to [facility] at 800 A.M.  Call this number if you have problems the morning of surgery:  [phone]   Remember:  Do not eat food or drink liquids after midnight.  Take these medicines the morning of surgery with A SIP OF WATER Tylenol if needed, Norco if needed, Soma if needed,  Famvir, Ativan if needed, Compazine if needed  Stop taking aspirin, BC's, goody's, Herbal medications, Fish Oil, Ibuprofen, Advil, Motrin, Aleve   Do not wear jewelry, make-up or nail polish.  Do not wear lotions, powders, or perfumes, or deoderant.  Do not shave 48 hours prior to surgery.  Men may shave face and neck.  Do not bring valuables to the hospital.  [facility] is not responsible for any belongings or valuables.  Contacts, dentures or bridgework may not be worn into surgery.  Leave your suitcase in the car.  After surgery it may be brought to your room.  For patients admitted to the hospital, discharge time will be determined by your treatment team.  Patients discharged the day of surgery will not be allowed to drive home.    Special instructions:  [facility] - Preparing for Surgery  Before surgery, you can play an important role.  Because skin is not sterile, your skin needs to be as free of germs as possible.  You can reduce the number of germs on you skin by washing with CHG (chlorahexidine gluconate) soap before surgery.  CHG is an antiseptic cleaner which kills germs and bonds with the skin to continue killing germs even after washing.  Please DO NOT use if you have an allergy to CHG or antibacterial soaps.   If your skin becomes reddened/irritated stop using the CHG and inform your nurse when you arrive at Short Stay.  Do not shave (including legs and underarms) for at least 48 hours prior to the first CHG shower.  You may shave your face.  Please follow these instructions carefully:   1.  Shower with CHG Soap the night before surgery and the    morning of Surgery.  2.  If you choose to wash your hair, wash your hair first as usual with your  normal shampoo.  3.  After you shampoo, rinse your hair and body thoroughly to remove the  Shampoo.  4.  Use CHG as you would any other liquid soap.  You can apply chg directly   to the skin and wash gently with scrungie or a clean washcloth.  5.  Apply the CHG Soap to your body ONLY FROM THE NECK DOWN.   Do not use on open wounds or open sores.  Avoid contact with your eyes,   ears, mouth and genitals (private parts).  Wash genitals (private parts) with your normal soap.  6.  Wash thoroughly, paying special attention to the area where your surgery  will be performed.  7.  Thoroughly rinse your body with warm water from the neck down.  8.  DO NOT shower/wash with your normal soap after using and rinsing off  the CHG Soap.  9.  Pat yourself dry with a clean towel.            10.  Wear clean pajamas.            11.  Place clean sheets on your bed the night of your first shower and do not sleep with pets.  Day of Surgery  Do not apply any lotions/deoderants the morning of surgery.  Please wear clean clothes to the hospital/surgery center.     Please read over the following fact sheets that you were given. Pain Booklet, MRSA Information and Surgical Site Infection Prevention

## 2016-03-13 ENCOUNTER — Ambulatory Visit (HOSPITAL_COMMUNITY)
Admission: RE | Admit: 2016-03-13 | Discharge: 2016-03-13 | Disposition: A | Discharge status: Home / Self Care | Payer: 59 | Source: Ambulatory Visit | Attending: Interventional Radiology | Admitting: Interventional Radiology

## 2016-03-13 ENCOUNTER — Encounter (HOSPITAL_COMMUNITY)
Admission: RE | Disposition: A | Discharge status: Home / Self Care | Payer: Self-pay | Source: Ambulatory Visit | Attending: Interventional Radiology

## 2016-03-13 ENCOUNTER — Encounter (HOSPITAL_COMMUNITY): Payer: Self-pay | Admitting: Anesthesiology

## 2016-03-13 ENCOUNTER — Ambulatory Visit (HOSPITAL_COMMUNITY): Payer: 59 | Admitting: Certified Registered"

## 2016-03-13 ENCOUNTER — Encounter (HOSPITAL_COMMUNITY): Payer: Self-pay | Admitting: *Deleted

## 2016-03-13 ENCOUNTER — Encounter (HOSPITAL_COMMUNITY): Payer: Self-pay

## 2016-03-13 DIAGNOSIS — IMO0002 Reserved for concepts with insufficient information to code with codable children: Secondary | ICD-10-CM

## 2016-03-13 DIAGNOSIS — M8458XA Pathological fracture in neoplastic disease, other specified site, initial encounter for fracture: Secondary | ICD-10-CM | POA: Diagnosis not present

## 2016-03-13 DIAGNOSIS — C9000 Multiple myeloma not having achieved remission: Secondary | ICD-10-CM | POA: Insufficient documentation

## 2016-03-13 DIAGNOSIS — Z87891 Personal history of nicotine dependence: Secondary | ICD-10-CM | POA: Insufficient documentation

## 2016-03-13 HISTORY — PX: IR GENERIC HISTORICAL: IMG1180011

## 2016-03-13 HISTORY — PX: RADIOLOGY WITH ANESTHESIA: SHX6223

## 2016-03-13 SURGERY — RADIOLOGY WITH ANESTHESIA
Anesthesia: General

## 2016-03-13 MED ORDER — LIDOCAINE HCL (CARDIAC) 20 MG/ML IV SOLN
INTRAVENOUS | Status: DC | PRN
  Administered 2016-03-13: 40 mg via INTRAVENOUS

## 2016-03-13 MED ORDER — PHENYLEPHRINE HCL 10 MG/ML IJ SOLN
INTRAMUSCULAR | Status: DC | PRN
  Administered 2016-03-13: 80 ug via INTRAVENOUS

## 2016-03-13 MED ORDER — ONDANSETRON HCL 4 MG/2ML IJ SOLN: 4.0000 mg | Freq: Once | INTRAMUSCULAR | Status: DC | PRN

## 2016-03-13 MED ORDER — ROCURONIUM BROMIDE 100 MG/10ML IV SOLN
INTRAVENOUS | Status: DC | PRN
Start: 2016-03-13 — End: 2016-03-13
  Administered 2016-03-13: 50 mg via INTRAVENOUS
  Administered 2016-03-13: 10 mg via INTRAVENOUS
  Administered 2016-03-13: 50 mg via INTRAVENOUS
  Administered 2016-03-13: 15 mg via INTRAVENOUS

## 2016-03-13 MED ORDER — SODIUM CHLORIDE 0.9 % IV SOLN: INTRAVENOUS | Status: DC

## 2016-03-13 MED ORDER — IOPAMIDOL (ISOVUE-300) INJECTION 61%
INTRAVENOUS | Status: AC
  Administered 2016-03-13: 50 mL
  Filled 2016-03-13: qty 50

## 2016-03-13 MED ORDER — DEXAMETHASONE SODIUM PHOSPHATE 10 MG/ML IJ SOLN
INTRAMUSCULAR | Status: DC | PRN
  Administered 2016-03-13: 10 mg via INTRAVENOUS

## 2016-03-13 MED ORDER — ONDANSETRON HCL 4 MG/2ML IJ SOLN
INTRAMUSCULAR | Status: DC | PRN
  Administered 2016-03-13: 4 mg via INTRAVENOUS

## 2016-03-13 MED ORDER — OXYCODONE HCL 5 MG PO TABS
ORAL_TABLET | ORAL | Status: AC
  Filled 2016-03-13: qty 1

## 2016-03-13 MED ORDER — MIDAZOLAM HCL 5 MG/5ML IJ SOLN
INTRAMUSCULAR | Status: DC | PRN
  Administered 2016-03-13: 2 mg via INTRAVENOUS

## 2016-03-13 MED ORDER — CEFAZOLIN SODIUM-DEXTROSE 2-4 GM/100ML-% IV SOLN
2.0000 g | INTRAVENOUS | Status: AC
  Administered 2016-03-13: 2 g via INTRAVENOUS
  Filled 2016-03-13 (×2): qty 100

## 2016-03-13 MED ORDER — OXYCODONE HCL 5 MG PO TABS
5.0000 mg | ORAL_TABLET | Freq: Once | ORAL | Status: AC | PRN
  Administered 2016-03-13: 5 mg via ORAL

## 2016-03-13 MED ORDER — TOBRAMYCIN SULFATE 1.2 G IJ SOLR
INTRAMUSCULAR | Status: AC
  Filled 2016-03-13: qty 1.2

## 2016-03-13 MED ORDER — PROPOFOL 10 MG/ML IV BOLUS
INTRAVENOUS | Status: DC | PRN
  Administered 2016-03-13: 180 mg via INTRAVENOUS

## 2016-03-13 MED ORDER — LACTATED RINGERS IV SOLN
INTRAVENOUS | Status: DC
  Administered 2016-03-13 (×3): via INTRAVENOUS

## 2016-03-13 MED ORDER — FENTANYL CITRATE (PF) 100 MCG/2ML IJ SOLN: 25.0000 ug | INTRAMUSCULAR | Status: DC | PRN

## 2016-03-13 MED ORDER — OXYCODONE HCL 5 MG/5ML PO SOLN: 5.0000 mg | Freq: Once | ORAL | Status: AC | PRN

## 2016-03-13 MED ORDER — ARTIFICIAL TEARS OP OINT
TOPICAL_OINTMENT | OPHTHALMIC | Status: DC | PRN
  Administered 2016-03-13: 1 via OPHTHALMIC

## 2016-03-13 MED ORDER — FENTANYL CITRATE (PF) 100 MCG/2ML IJ SOLN
INTRAMUSCULAR | Status: DC | PRN
  Administered 2016-03-13: 100 ug via INTRAVENOUS
  Administered 2016-03-13 (×3): 50 ug via INTRAVENOUS

## 2016-03-13 MED ORDER — SUGAMMADEX SODIUM 200 MG/2ML IV SOLN
INTRAVENOUS | Status: DC | PRN
  Administered 2016-03-13: 200 mg via INTRAVENOUS

## 2016-03-13 NOTE — Transfer of Care (Signed)
Immediate Anesthesia Transfer of Care Note  Patient: Jorge [name]  Procedure(s) Performed: Procedure(s): LUMBER ABLATION (N/A)  Patient Location: PACU  Anesthesia Type:General  Level of Consciousness: awake, alert , oriented and patient cooperative  Airway & Oxygen Therapy: Patient Spontanous Breathing and Patient connected to nasal cannula oxygen  Post-op Assessment: Report given to RN and Post -op Vital signs reviewed and stable  Post vital signs: Reviewed and stable  Last Vitals:  Vitals:   03/13/16 0806 03/13/16 1405  BP: (!) 162/92   Pulse: (!) 0   Resp: 20   Temp: 36.4 C (P) 36.9 C    Last Pain:  Vitals:   03/13/16 0817  TempSrc:   PainSc: 5          Complications: No apparent anesthesia complications

## 2016-03-13 NOTE — Procedures (Signed)
S/P L1,L2 and L3 tumor ablation followed by vertetbral body augmentation with balloon kyphoplasty for painful pathological compression fractures

## 2016-03-13 NOTE — Discharge Instructions (Signed)
Back

## 2016-03-13 NOTE — Anesthesia Postprocedure Evaluation (Signed)
Anesthesia Post Note  Patient: Jorge [name]  Procedure(s) Performed: Procedure(s) (LRB): LUMBER ABLATION (N/A)  Patient location during evaluation: PACU Anesthesia Type: General Level of consciousness: awake, awake and alert and oriented Pain management: pain level controlled Vital Signs Assessment: post-procedure vital signs reviewed and stable Respiratory status: spontaneous breathing, nonlabored ventilation and respiratory function stable Cardiovascular status: blood pressure returned to baseline Anesthetic complications: no    Last Vitals:  Vitals:   03/13/16 0806 03/13/16 1405  BP: (!) 162/92 118/75  Pulse: (!) 0   Resp: 20 15  Temp: 36.4 C 36.9 C    Last Pain:  Vitals:   03/13/16 1405  TempSrc:   PainSc: 0-No pain                 Denisse Whitenack COKER

## 2016-03-13 NOTE — H&P (Signed)
Chief Complaint: L1,2,3 pathologic compression fractures  Referring Physician:De. Burney Gauze  Supervising Physician: Luanne Bras  Patient Status: Out-pt  HPI: Jorge [name] is an 60 y.o. male who is well-known to Dr. Estanislado Pandy for multiple prior ablations and VPs of T7,9,11, and 12.  These are all pathologic from multiple myeloma.  Today he presents for treatment of L1, 2, and 3.  He is complaining today of some pain higher up where his prior treatments have been.  He has no change to his medical history since we saw him 2-3 weeks ago.  Past Medical History:  Past Medical History:  Diagnosis Date  . Anxiety   . Depression   . Headache    mild at times  . Multiple myeloma ([place]) 02/22/2016  . Myeloma ([place]) 2017    Past Surgical History:  Past Surgical History:  Procedure Laterality Date  . COLONOSCOPY  [id number]  . INGUINAL HERNIA REPAIR  12/16/13   BIH by Dr. Larz [place]  . IR GENERIC HISTORICAL  02/11/2016   IR RADIOLOGIST EVAL & MGMT 02/11/2016 [facility]  . IR GENERIC HISTORICAL  02/15/2016   IR BONE TUMOR(S)RF ABLATION 02/15/2016 Luanne Bras, MD [facility]  . IR GENERIC HISTORICAL  02/15/2016   IR BONE TUMOR(S)RF ABLATION 02/15/2016 Luanne Bras, MD [facility]  . IR GENERIC HISTORICAL  02/15/2016   IR BONE TUMOR(S)RF ABLATION 02/15/2016 Luanne Bras, MD [facility]  . IR GENERIC HISTORICAL  02/15/2016   IR KYPHO THORACIC WITH BONE BIOPSY 02/15/2016 Luanne Bras, MD [facility]  . IR GENERIC HISTORICAL  02/15/2016   IR KYPHO THORACIC WITH BONE BIOPSY 02/15/2016 Luanne Bras, MD [facility]  . IR GENERIC HISTORICAL  02/15/2016   IR VERTEBROPLASTY CERV/THOR BX INC UNI/BIL INC/INJECT/IMAGING 02/15/2016 Luanne Bras, MD [facility]  . RADIOLOGY WITH ANESTHESIA N/A 02/15/2016   Procedure: Spinal Ablation;  Surgeon: Luanne Bras, MD;  Location: [place];  Service: Radiology;  Laterality: N/A;  . ROTATOR CUFF REPAIR  2003  . TONSILLECTOMY        Family History:  Family History  Problem Relation Age of Onset  . Cancer Mother     ovarian and colon  . Heart disease Father     Social History:  reports that he quit smoking about 35 years ago. He has never used smokeless tobacco. He reports that he drinks alcohol. He reports that he does not use drugs.  Allergies: No Known Allergies  Medications: Medications reviewed in Epic  Please HPI for pertinent positives, otherwise complete 10 system ROS negative.  Mallampati Score: MD Evaluation Airway: WNL Heart: WNL Abdomen: WNL Chest/ Lungs: WNL  Physical Exam: Temp (F)     97.7-98 97.5  97.5 (36.4)  08/31 0806  Pulse Rate     69-78 0  0  08/31 0806  Resp     _0 08/31 0806  BP     127/75-138/80 162/92  162/92  08/31 0806  SpO2 (%)     99 98  98  08/31 0806  Weight (lb)  175-180 175  175 lb (79.4 kg)  08   General: pleasant, WD, WN white male who is laying in bed in NAD HEENT: head is normocephalic, atraumatic.  Sclera are noninjected.  PERRL.  Ears and nose without any masses or lesions.  Mouth is pink and moist Heart: regular, rate, and rhythm.  Normal s1,s2. No obvious murmurs, gallops, or rubs noted.  Palpable radial and pedal pulses bilaterally Lungs:  CTAB, no wheezes, rhonchi, or rales noted.  Respiratory effort nonlabored Abd: soft, NT, ND, +BS, no masses, hernias, or organomegaly MS: all 4 extremities are symmetrical with no cyanosis, clubbing, or edema.  nontender to palpation over his spinous processes Psych: A&Ox3 with an appropriate affect.   Labs: Results for orders placed or performed during the hospital encounter of 03/12/16 (from the past 48 hour(s))  Surgical pcr screen     Status: None   Collection Time: 03/12/16  8:47 AM  Result Value Ref Range   MRSA, PCR NEGATIVE NEGATIVE   Staphylococcus aureus NEGATIVE NEGATIVE    Comment:        The Xpert SA Assay (FDA approved for NASAL specimens in patients over 45 years of age), is one  component of a comprehensive surveillance program.  Test performance has been validated by [facility] for patients greater than or equal to 62 year old. It is not intended to diagnose infection nor to guide or monitor treatment.   Basic metabolic panel     Status: Abnormal   Collection Time: 03/12/16 10:00 AM  Result Value Ref Range   Sodium 130 (L) 135 - 145 mmol/L   Potassium 4.4 3.5 - 5.1 mmol/L   Chloride 102 101 - 111 mmol/L   CO2 20 (L) 22 - 32 mmol/L   Glucose, Bld 99 65 - 99 mg/dL   BUN 14 6 - 20 mg/dL   Creatinine, Ser 0.74 0.61 - 1.24 mg/dL   Calcium 9.3 8.9 - 10.3 mg/dL   GFR calc non Af Amer >60 >60 mL/min   GFR calc Af Amer >60 >60 mL/min    Comment: (NOTE) The eGFR has been calculated using the CKD EPI equation. This calculation has not been validated in all clinical situations. eGFR's persistently <60 mL/min signify possible Chronic Kidney Disease.    Anion gap 8 5 - 15  CBC with Differential/Platelet     Status: Abnormal   Collection Time: 03/12/16 10:00 AM  Result Value Ref Range   WBC 6.5 4.0 - 10.5 K/uL   RBC 3.83 (L) 4.22 - 5.81 MIL/uL   Hemoglobin 13.0 13.0 - 17.0 g/dL   HCT 38.4 (L) 39.0 - 52.0 %   MCV 100.3 (H) 78.0 - 100.0 fL   MCH 33.9 26.0 - 34.0 pg   MCHC 33.9 30.0 - 36.0 g/dL   RDW 13.9 11.5 - 15.5 %   Platelets 188 150 - 400 K/uL   Neutrophils Relative % 67 %   Lymphocytes Relative 23 %   Monocytes Relative 7 %   Eosinophils Relative 3 %   Basophils Relative 0 %   Neutro Abs 4.3 1.7 - 7.7 K/uL   Lymphs Abs 1.5 0.7 - 4.0 K/uL   Monocytes Absolute 0.5 0.1 - 1.0 K/uL   Eosinophils Absolute 0.2 0.0 - 0.7 K/uL   Basophils Absolute 0.0 0.0 - 0.1 K/uL  Protime-INR     Status: None   Collection Time: 03/12/16 10:00 AM  Result Value Ref Range   Prothrombin Time 14.6 11.4 - 15.2 seconds   INR 1.14   APTT     Status: None   Collection Time: 03/12/16 10:00 AM  Result Value Ref Range   aPTT 26 24 - 36 seconds    Imaging: No results  found.  Assessment/Plan 1. Pathologic fractures of L1,2, and 3 vertebral bodies secondary to multiple myeloma -we will plan for ablation and VP of the above mentioned vertebral bodies under general anesthesia today. -labs and  vitals have been reviewed -the patient is complaining of some pain in his upper back near some of his prior treated sites.  I have relayed this to Dr. Estanislado Pandy so he can evaluate that during the procedure as well. -Risks and Benefits discussed with the patient including, but not limited to education regarding the natural healing process of compression fractures without intervention, bleeding, infection, cement migration which may cause spinal cord damage, paralysis, pulmonary embolism or even death. All of the patient's questions were answered, patient is agreeable to proceed. Consent signed and in chart.   Thank you for this interesting consult.  I greatly enjoyed meeting Jorge [name] and look forward to participating in their care.  A copy of this report was sent to the requesting provider on this date.  Electronically Signed: Henreitta Cea 03/13/2016, 8:49 AM   I spent a total of    25 Minutes in face to face in clinical consultation, greater than 50% of which was counseling/coordinating care for pathologic compression fractures of L1,2,3

## 2016-03-13 NOTE — Discharge Instructions (Signed)
1.No stooping,bending or lifting more than 10 lbs for 2 weeks. °2.Use walker to ambulate for 2 weeks. °3.RTC in 2 weeks. °

## 2016-03-13 NOTE — Anesthesia Preprocedure Evaluation (Signed)
Anesthesia Evaluation  Patient identified by MRN, date of birth, ID band Patient awake    Reviewed: Allergy & Precautions, NPO status , Patient's Chart, lab work & pertinent test results  Airway Mallampati: II  TM Distance: >3 FB Neck ROM: Full    Dental  (+) Teeth Intact, Dental Advisory Given   Pulmonary former smoker,    breath sounds clear to auscultation       Cardiovascular  Rhythm:Regular Rate:Normal     Neuro/Psych    GI/Hepatic   Endo/Other    Renal/GU      Musculoskeletal   Abdominal   Peds  Hematology   Anesthesia Other Findings   Reproductive/Obstetrics                             Anesthesia Physical Anesthesia Plan  ASA: III  Anesthesia Plan: General   Post-op Pain Management:    Induction: Intravenous  Airway Management Planned: Oral ETT  Additional Equipment:   Intra-op Plan:   Post-operative Plan: Extubation in OR  Informed Consent: I have reviewed the patients History and Physical, chart, labs and discussed the procedure including the risks, benefits and alternatives for the proposed anesthesia with the patient or authorized representative who has indicated his/her understanding and acceptance.     Plan Discussed with: CRNA and Anesthesiologist  Anesthesia Plan Comments:         Anesthesia Quick Evaluation

## 2016-03-13 NOTE — H&P (Signed)
Referring Physician(s): Dr Burney Gauze  Supervising Physician: Luanne Bras  Patient Status:  Outpatient  Chief Complaint:  Painful compression fractures Lumbar 1/2/3  Subjective:  Hx Multiple Myeloma   S/P L1,L2 and L3 tumor ablation followed by vertetbral body augmentation with balloon kyphoplasty for painful pathological compression fractures  Pt resting comfortably No complaints Recuperating in PACU Plan for discharge at 530 pm  Allergies: Review of patient's allergies indicates no known allergies.  Medications: Prior to Admission medications   Medication Sig Start Date End Date Taking? Authorizing Provider  acetaminophen (TYLENOL) 500 MG tablet Take 500 mg by mouth every 6 (six) hours as needed.   Yes Historical Provider, MD  aspirin EC 325 MG tablet Take 1 tablet (325 mg total) by mouth daily. 02/22/16  Yes [name], MD  carisoprodol (SOMA) 350 MG tablet Take 1 tablet (350 mg total) by mouth 4 (four) times daily as needed for muscle spasms. 02/22/16  Yes [name], MD  Cholecalciferol (VITAMIN D PO) Take 1 tablet by mouth daily.   Yes Historical Provider, MD  dexamethasone (DECADRON) 4 MG tablet Take 5 pills at one time with food every week 02/22/16  Yes [name], MD  famciclovir (FAMVIR) 500 MG tablet Take 1 tablet (500 mg total) by mouth daily. 02/22/16  Yes [name], MD  fentaNYL (DURAGESIC - DOSED MCG/HR) 25 MCG/HR patch Place 1 patch (25 mcg total) onto the skin every 3 (three) days. 03/10/16  Yes [name], MD  HYDROcodone-acetaminophen (NORCO) 10-325 MG tablet Take 0.5-1 tablets by mouth every 8 (eight) hours as needed for moderate pain. 02/22/16  Yes [name], MD  lenalidomide (REVLIMID) 25 MG capsule Take 1 capsule daily for 21 days on rthen 7 days 48f 02/22/16  Yes [name]  magnesium oxide (MAG-OX) 400 MG tablet Take 400 mg by mouth daily.   Yes Historical Provider, MD  prochlorperazine (COMPAZINE) 10 MG  tablet Take 1 tablet (10 mg total) by mouth every 6 (six) hours as needed for nausea or vomiting. 02/19/16  Yes [name]  sertraline (ZOLOFT) 100 MG tablet Take 100 mg by mouth daily. 01/02/16  Yes Historical Provider, MD  zolpidem (AMBIEN) 10 MG tablet Take 1 tablet (10 mg total) by mouth at bedtime as needed. 03/10/16  Yes [name]  ibuprofen (ADVIL,MOTRIN) 400 MG tablet Take 400 mg by mouth every 6 (six) hours as needed for mild pain.     Historical Provider, MD  lidocaine-prilocaine (EMLA) cream Apply to affected area once 02/27/16   [name]  LORazepam (ATIVAN) 0.5 MG tablet Take 1 tablet (0.5 mg total) by mouth every 6 (six) hours as needed (Nausea or vomiting). 02/27/16   [name]  ondansetron (ZOFRAN) 8 MG tablet Take 1 tablet (8 mg total) by mouth 2 (two) times daily as needed (Nausea or vomiting). 02/27/16   [name]     Vital Signs: BP 109/75   Pulse 77   Temp 98.4 F (36.9 C)   Resp 15   Wt 175 lb (79.4 kg)   SpO2 94%   BMI 25.11 kg/m   Physical Exam  Constitutional: He is oriented to person, place, and time.  Pulmonary/Chest: Effort normal and breath sounds normal.  Abdominal: Soft.  Musculoskeletal: Normal range of motion.  Moves all 4s  Neurological: He is alert and oriented to person, place, and time.  Skin: Skin is  warm and dry.  Psychiatric: He has a normal mood and affect. His behavior is normal. Judgment and thought content normal.    Imaging: No results found.  Labs:  CBC:  Recent Labs  02/22/16 0804 02/27/16 0847 03/05/16 0841 03/12/16 1000  WBC 5.4 5.4 4.5 6.5  HGB 13.3 12.6* 13.4 13.0  HCT 39.2 36.7* 39.0 38.4*  PLT 229 239 233 188    COAGS:  Recent Labs  01/25/16 0640 02/15/16 1000 03/12/16 1000  INR 1.19 1.23 1.14  APTT '27 26 26    ' BMP:  Recent Labs  01/25/16 0640  02/15/16 1000 02/22/16 0804 02/27/16 0847 03/05/16 0841 03/12/16 1000  NA 133*  < > 134* 136 130 131 130*  K  4.0  < > 4.2 3.8 4.0 4.0 4.4  CL 101  < > 105  --  107 106 102  CO2 24  < > '25 22 23 20 ' 20*  GLUCOSE 106*  < > 99 107 122* 118 99  BUN 12  < > 12 16.'4 17 13 14  ' CALCIUM 10.1  < > 9.7 9.8 9.5 8.7 9.3  CREATININE 0.84  < > 0.84 1.0 1.1 0.8 0.74  GFRNONAA >60  --  >60  --   --   --  >60  GFRAA >60  --  >60  --   --   --  >60  < > = values in this interval not displayed.  LIVER FUNCTION TESTS:  Recent Labs  02/08/16 1420 02/22/16 0804 02/27/16 0847 03/05/16 0841  BILITOT 0.70 0.41 0.70 1.00  AST '25 19 22 ' 32  ALT 34 24 28 37  ALKPHOS 83 95 89* 90*  PROT 9.9*  9.5* 10.7* 9.8* 10.2*  ALBUMIN 3.6 3.6 3.4 3.9    Assessment and Plan:  Lumbar 1-2-3 tumor ablation and vertebral augmentation  Doing well Plan for discharge to home 530 pm  Electronically Signed: Celester Lech A 03/13/2016, 3:59 PM   I spent a total of 15 Minutes at the the patient's bedside AND on the patient's hospital floor or unit, greater than 50% of which was counseling/coordinating care for L 1-2 3 tumor ablation and KP

## 2016-03-14 ENCOUNTER — Encounter (HOSPITAL_COMMUNITY): Payer: Self-pay | Admitting: Interventional Radiology

## 2016-03-18 ENCOUNTER — Other Ambulatory Visit (HOSPITAL_COMMUNITY): Payer: Self-pay | Admitting: Interventional Radiology

## 2016-03-18 DIAGNOSIS — IMO0002 Reserved for concepts with insufficient information to code with codable children: Secondary | ICD-10-CM

## 2016-03-19 ENCOUNTER — Other Ambulatory Visit: Payer: Self-pay | Admitting: Hematology & Oncology

## 2016-03-19 ENCOUNTER — Telehealth: Payer: Self-pay | Admitting: *Deleted

## 2016-03-19 ENCOUNTER — Encounter (HOSPITAL_COMMUNITY): Payer: Self-pay | Admitting: Interventional Radiology

## 2016-03-19 DIAGNOSIS — C9000 Multiple myeloma not having achieved remission: Secondary | ICD-10-CM

## 2016-03-19 NOTE — Telephone Encounter (Signed)
Received call from patient wife who states that patient is still having some pain in his ribs and sternum.  They contacted Dr. Patrecia Pour but havent heard back yet.  Dr. Marin Olp states they could do an Xray of areas.  PAth report from Bone marrow mailed to patient since it did not show up on MyChart.

## 2016-03-21 ENCOUNTER — Ambulatory Visit (HOSPITAL_BASED_OUTPATIENT_CLINIC_OR_DEPARTMENT_OTHER): Payer: 59 | Admitting: Family

## 2016-03-21 ENCOUNTER — Ambulatory Visit (HOSPITAL_BASED_OUTPATIENT_CLINIC_OR_DEPARTMENT_OTHER)
Admission: RE | Admit: 2016-03-21 | Discharge: 2016-03-21 | Disposition: A | Discharge status: Home / Self Care | Payer: 59 | Source: Ambulatory Visit | Attending: Hematology & Oncology | Admitting: Hematology & Oncology

## 2016-03-21 ENCOUNTER — Other Ambulatory Visit (HOSPITAL_BASED_OUTPATIENT_CLINIC_OR_DEPARTMENT_OTHER): Payer: 59

## 2016-03-21 ENCOUNTER — Other Ambulatory Visit: Payer: Self-pay

## 2016-03-21 ENCOUNTER — Other Ambulatory Visit: Payer: Self-pay | Admitting: Hematology & Oncology

## 2016-03-21 VITALS — BP 118/72 | HR 74 | Temp 97.8°F | Resp 18

## 2016-03-21 DIAGNOSIS — C9000 Multiple myeloma not having achieved remission: Secondary | ICD-10-CM | POA: Insufficient documentation

## 2016-03-21 DIAGNOSIS — R2240 Localized swelling, mass and lump, unspecified lower limb: Secondary | ICD-10-CM

## 2016-03-21 DIAGNOSIS — I82491 Acute embolism and thrombosis of other specified deep vein of right lower extremity: Secondary | ICD-10-CM | POA: Insufficient documentation

## 2016-03-21 DIAGNOSIS — D472 Monoclonal gammopathy: Secondary | ICD-10-CM

## 2016-03-21 DIAGNOSIS — I82412 Acute embolism and thrombosis of left femoral vein: Secondary | ICD-10-CM | POA: Insufficient documentation

## 2016-03-21 DIAGNOSIS — I82413 Acute embolism and thrombosis of femoral vein, bilateral: Secondary | ICD-10-CM

## 2016-03-21 DIAGNOSIS — I82432 Acute embolism and thrombosis of left popliteal vein: Secondary | ICD-10-CM | POA: Diagnosis not present

## 2016-03-21 LAB — CBC WITH DIFFERENTIAL (CANCER CENTER ONLY)
BASO#: 0 10*3/uL (ref 0.0–0.2)
BASO%: 0.3 % (ref 0.0–2.0)
EOS ABS: 0.3 10*3/uL (ref 0.0–0.5)
EOS%: 5.1 % (ref 0.0–7.0)
HCT: 38.5 % — ABNORMAL LOW (ref 38.7–49.9)
HGB: 13.4 g/dL (ref 13.0–17.1)
LYMPH#: 1.3 10*3/uL (ref 0.9–3.3)
LYMPH%: 20.7 % (ref 14.0–48.0)
MCH: 34.5 pg — AB (ref 28.0–33.4)
MCHC: 34.8 g/dL (ref 32.0–35.9)
MCV: 99 fL — AB (ref 82–98)
MONO#: 0.8 10*3/uL (ref 0.1–0.9)
MONO%: 11.6 % (ref 0.0–13.0)
NEUT#: 4 10*3/uL (ref 1.5–6.5)
NEUT%: 62.3 % (ref 40.0–80.0)
PLATELETS: 222 10*3/uL (ref 145–400)
RBC: 3.88 10*6/uL — AB (ref 4.20–5.70)
RDW: 13.6 % (ref 11.1–15.7)
WBC: 6.5 10*3/uL (ref 4.0–10.0)

## 2016-03-21 LAB — CMP (CANCER CENTER ONLY)
ALT(SGPT): 42 U/L (ref 10–47)
AST: 25 U/L (ref 11–38)
Albumin: 3.6 g/dL (ref 3.3–5.5)
Alkaline Phosphatase: 83 U/L (ref 26–84)
BUN: 16 mg/dL (ref 7–22)
CHLORIDE: 103 meq/L (ref 98–108)
CO2: 25 meq/L (ref 18–33)
CREATININE: 0.8 mg/dL (ref 0.6–1.2)
Calcium: 9.1 mg/dL (ref 8.0–10.3)
GLUCOSE: 120 mg/dL — AB (ref 73–118)
POTASSIUM: 3.6 meq/L (ref 3.3–4.7)
SODIUM: 134 meq/L (ref 128–145)
TOTAL PROTEIN: 8.4 g/dL — AB (ref 6.4–8.1)
Total Bilirubin: 0.9 mg/dl (ref 0.20–1.60)

## 2016-03-21 MED ORDER — LENALIDOMIDE 25 MG PO CAPS: ORAL_CAPSULE | ORAL | 6 refills | Status: DC

## 2016-03-21 MED ORDER — RIVAROXABAN (XARELTO) VTE STARTER PACK (15 & 20 MG): ORAL_TABLET | ORAL | 0 refills | Status: DC

## 2016-03-21 MED ORDER — RIVAROXABAN 20 MG PO TABS: 20.0000 mg | ORAL_TABLET | Freq: Every day | ORAL | 4 refills | Status: DC

## 2016-03-21 NOTE — Progress Notes (Signed)
Hematology and Oncology Follow Up Visit  Jorge [name] [id number] 09-16-1955 60 y.o. 03/21/2016   Principle Diagnosis:  IgG Kappa myeloma - Hyperdiploid/+11  Current Therapy:   RVD s/p cycle 3 Zometa 4 mg IV q month    Interim History:  Jorge [name] is here today with c/o bilateral leg swelling and tenderness, worse in the left leg. He started noticing this yesterday. He is also having rib and sternal pain. No numbness, tingling or weakness in his extremities. No incontinence.  Bilateral Homan's sign positive, worse in left leg. He has been taking Aspirin 325 mg PO daily.  He has a dry cough and denies SOB.  No fever, chills, n/v, rash, dizziness, chest pain, palpitations, abdominal pain or changes in bowel or bladder habits. He is still having constipation despite taking a stool softener. He will start taking Miralax PO BID and see if this helps.  Appetite is down and he admits that he has not been drinking enough fluids.   Medications:    Medication List       Accurate as of 03/21/16 12:19 PM. Always use your most recent med list.          acetaminophen 500 MG tablet Commonly known as:  TYLENOL Take 500 mg by mouth every 6 (six) hours as needed.   aspirin EC 325 MG tablet Take 1 tablet (325 mg total) by mouth daily.   carisoprodol 350 MG tablet Commonly known as:  SOMA Take 1 tablet (350 mg total) by mouth 4 (four) times daily as needed for muscle spasms.   dexamethasone 4 MG tablet Commonly known as:  DECADRON Take 5 pills at one time with food every week   famciclovir 500 MG tablet Commonly known as:  FAMVIR Take 1 tablet (500 mg total) by mouth daily.   fentaNYL 25 MCG/HR patch Commonly known as:  DURAGESIC - dosed mcg/hr Place 1 patch (25 mcg total) onto the skin every 3 (three) days.   HYDROcodone-acetaminophen 10-325 MG tablet Commonly known as:  NORCO Take 0.5-1 tablets by mouth every 8 (eight) hours as needed for moderate pain.   ibuprofen 400 MG  tablet Commonly known as:  ADVIL,MOTRIN Take 400 mg by mouth every 6 (six) hours as needed for mild pain.   lenalidomide 25 MG capsule Commonly known as:  REVLIMID Take 1 capsule daily for 21 days on rthen 7 days 22f   lidocaine-prilocaine cream Commonly known as:  EMLA Apply to affected area once   LORazepam 0.5 MG tablet Commonly known as:  ATIVAN Take 1 tablet (0.5 mg total) by mouth every 6 (six) hours as needed (Nausea or vomiting).   magnesium oxide 400 MG tablet Commonly known as:  MAG-OX Take 400 mg by mouth daily.   ondansetron 8 MG tablet Commonly known as:  ZOFRAN Take 1 tablet (8 mg total) by mouth 2 (two) times daily as needed (Nausea or vomiting).   prochlorperazine 10 MG tablet Commonly known as:  COMPAZINE Take 1 tablet (10 mg total) by mouth every 6 (six) hours as needed for nausea or vomiting.   sertraline 100 MG tablet Commonly known as:  ZOLOFT Take 100 mg by mouth daily.   VITAMIN D PO Take 1 tablet by mouth daily.   zolpidem 10 MG tablet Commonly known as:  AMBIEN Take 1 tablet (10 mg total) by mouth at bedtime as needed.       Allergies: No Known Allergies  Past Medical History, Surgical history, Social history, and Family History were  reviewed and updated.  Review of Systems: All other 10 point review of systems is negative.   Physical Exam:  vitals were not taken for this visit.  Wt Readings from Last 3 Encounters:  03/13/16 175 lb (79.4 kg)  03/12/16 180 lb (81.6 kg)  03/12/16 175 lb 6.4 oz (79.6 kg)    Ocular: Sclerae unicteric, pupils equal, round and reactive to light Ear-nose-throat: Oropharynx clear, dentition fair Lymphatic: No cervical supraclavicular or axillary adenopathy Lungs no rales or rhonchi, good excursion bilaterally Heart regular rate and rhythm, no murmur appreciated Abd soft, nontender, positive bowel sounds, no liver or spleen tip palpated on exam, no fluid wave MSK no focal spinal tenderness, no joint  edema Neuro: non-focal, well-oriented, appropriate affect Breasts: Deferred  Lab Results  Component Value Date   WBC 6.5 03/21/2016   HGB 13.4 03/21/2016   HCT 38.5 (L) 03/21/2016   MCV 99 (H) 03/21/2016   PLT 222 03/21/2016   No results found for: FERRITIN, IRON, TIBC, UIBC, IRONPCTSAT Lab Results  Component Value Date   RBC 3.88 (L) 03/21/2016   Lab Results  Component Value Date   KAPLAMBRATIO 6.88 (H) 02/08/2016   Lab Results  Component Value Date   IGGSERUM 4,563 (H) 02/08/2016   IGMSERUM 18 (L) 02/08/2016   Lab Results  Component Value Date   MSPIKE 3.6 (H) 02/08/2016     Chemistry      Component Value Date/Time   NA 134 03/21/2016 1053   NA 136 02/22/2016 0804   K 3.6 03/21/2016 1053   K 3.8 02/22/2016 0804   CL 103 03/21/2016 1053   CO2 25 03/21/2016 1053   CO2 22 02/22/2016 0804   BUN 16 03/21/2016 1053   BUN 16.4 02/22/2016 0804   CREATININE 0.8 03/21/2016 1053   CREATININE 1.0 02/22/2016 0804      Component Value Date/Time   CALCIUM 9.1 03/21/2016 1053   CALCIUM 9.8 02/22/2016 0804   ALKPHOS 83 03/21/2016 1053   ALKPHOS 95 02/22/2016 0804   AST 25 03/21/2016 1053   AST 19 02/22/2016 0804   ALT 42 03/21/2016 1053   ALT 24 02/22/2016 0804   BILITOT 0.90 03/21/2016 1053   BILITOT 0.41 02/22/2016 0804     Impression and Plan: Jorge [name] is a 60 yo white male with IgG Kappa Myeloma receiving RVD s/p cycle 3. So far he has tolerated treatment well. He is here today with c/o bilateral leg swelling worse on the left and rib/sternum pain.  US showed an acute thrombus in the left common femoral vein that is partially occlusive and occlusive throughout the left femoral and popliteal veins. In the right lower extremity, there is partially occlusive thrombus in the common femoral and profunda femoral veins which has a subacute appearance. I have added a hypercoag panel to today's lab work.  His chest xray was also negative. He describes his pain as  musculoskeletal and denies SOB or palpitations.  We will start him on a Xarelto starter pack today. He will stop taking aspirin.  He will increase his fluid intake. Prescription for thigh high compression stockings was given to his wife.  We will plan to see him back next week for his next follow-up with Jorge [name] Olp and treatment.  Both he and his wife know to contact our office with any questions or concerns. We can certainly see him sooner if need be.  They know to take him directly to the ED if he begins to develop respiratory distress.  Eliezer Bottom, NP 9/8/201712:19 PM

## 2016-03-26 ENCOUNTER — Ambulatory Visit (HOSPITAL_BASED_OUTPATIENT_CLINIC_OR_DEPARTMENT_OTHER): Payer: 59 | Admitting: Hematology & Oncology

## 2016-03-26 ENCOUNTER — Encounter: Payer: Self-pay | Admitting: Hematology & Oncology

## 2016-03-26 ENCOUNTER — Other Ambulatory Visit (HOSPITAL_BASED_OUTPATIENT_CLINIC_OR_DEPARTMENT_OTHER): Payer: 59

## 2016-03-26 ENCOUNTER — Ambulatory Visit (HOSPITAL_BASED_OUTPATIENT_CLINIC_OR_DEPARTMENT_OTHER): Payer: 59

## 2016-03-26 ENCOUNTER — Other Ambulatory Visit: Payer: Self-pay | Admitting: Nurse Practitioner

## 2016-03-26 VITALS — BP 125/84 | HR 70 | Temp 97.7°F | Resp 16 | Ht 70.0 in | Wt 172.0 lb

## 2016-03-26 DIAGNOSIS — C9000 Multiple myeloma not having achieved remission: Secondary | ICD-10-CM

## 2016-03-26 DIAGNOSIS — Z5112 Encounter for antineoplastic immunotherapy: Secondary | ICD-10-CM | POA: Diagnosis not present

## 2016-03-26 DIAGNOSIS — I82413 Acute embolism and thrombosis of femoral vein, bilateral: Secondary | ICD-10-CM

## 2016-03-26 DIAGNOSIS — D472 Monoclonal gammopathy: Secondary | ICD-10-CM

## 2016-03-26 LAB — CMP (CANCER CENTER ONLY)
ALT(SGPT): 36 U/L (ref 10–47)
AST: 30 U/L (ref 11–38)
Albumin: 3.6 g/dL (ref 3.3–5.5)
Alkaline Phosphatase: 98 U/L — ABNORMAL HIGH (ref 26–84)
BUN: 18 mg/dL (ref 7–22)
CHLORIDE: 104 meq/L (ref 98–108)
CO2: 22 mEq/L (ref 18–33)
Calcium: 8.8 mg/dL (ref 8.0–10.3)
Creat: 1 mg/dl (ref 0.6–1.2)
GLUCOSE: 110 mg/dL (ref 73–118)
POTASSIUM: 4 meq/L (ref 3.3–4.7)
Sodium: 130 mEq/L (ref 128–145)
TOTAL PROTEIN: 8.3 g/dL — AB (ref 6.4–8.1)
Total Bilirubin: 0.7 mg/dl (ref 0.20–1.60)

## 2016-03-26 LAB — CBC WITH DIFFERENTIAL (CANCER CENTER ONLY)
BASO#: 0 10*3/uL (ref 0.0–0.2)
BASO%: 0.6 % (ref 0.0–2.0)
EOS ABS: 0.1 10*3/uL (ref 0.0–0.5)
EOS%: 2.4 % (ref 0.0–7.0)
HCT: 39.5 % (ref 38.7–49.9)
HGB: 13.6 g/dL (ref 13.0–17.1)
LYMPH#: 1.8 10*3/uL (ref 0.9–3.3)
LYMPH%: 33.9 % (ref 14.0–48.0)
MCH: 33.5 pg — AB (ref 28.0–33.4)
MCHC: 34.4 g/dL (ref 32.0–35.9)
MCV: 97 fL (ref 82–98)
MONO#: 0.7 10*3/uL (ref 0.1–0.9)
MONO%: 13.4 % — ABNORMAL HIGH (ref 0.0–13.0)
NEUT#: 2.7 10*3/uL (ref 1.5–6.5)
NEUT%: 49.7 % (ref 40.0–80.0)
PLATELETS: 331 10*3/uL (ref 145–400)
RBC: 4.06 10*6/uL — ABNORMAL LOW (ref 4.20–5.70)
RDW: 12.9 % (ref 11.1–15.7)
WBC: 5.4 10*3/uL (ref 4.0–10.0)

## 2016-03-26 LAB — TECHNOLOGIST REVIEW CHCC SATELLITE

## 2016-03-26 LAB — LACTATE DEHYDROGENASE: LDH: 230 U/L (ref 125–245)

## 2016-03-26 MED ORDER — ZOLEDRONIC ACID 4 MG/100ML IV SOLN: 4.0000 mg | Freq: Once | INTRAVENOUS | Status: DC

## 2016-03-26 MED ORDER — PROCHLORPERAZINE MALEATE 10 MG PO TABS
ORAL_TABLET | ORAL | Status: AC
  Filled 2016-03-26: qty 1

## 2016-03-26 MED ORDER — PROCHLORPERAZINE MALEATE 10 MG PO TABS
10.0000 mg | ORAL_TABLET | Freq: Once | ORAL | Status: AC
  Administered 2016-03-26: 10 mg via ORAL

## 2016-03-26 MED ORDER — POLYETHYLENE GLYCOL 3350 17 GM/SCOOP PO POWD: 17.0000 g | Freq: Every day | ORAL | 3 refills | Status: DC

## 2016-03-26 MED ORDER — ZOLEDRONIC ACID 4 MG/100ML IV SOLN
4.0000 mg | Freq: Once | INTRAVENOUS | Status: AC
  Administered 2016-03-26: 4 mg via INTRAVENOUS
  Filled 2016-03-26: qty 100

## 2016-03-26 MED ORDER — BORTEZOMIB CHEMO SQ INJECTION 3.5 MG (2.5MG/ML)
1.3000 mg/m2 | Freq: Once | INTRAMUSCULAR | Status: AC
  Administered 2016-03-26: 2.5 mg via SUBCUTANEOUS
  Filled 2016-03-26: qty 2.5

## 2016-03-26 MED ORDER — FENTANYL 25 MCG/HR TD PT72: 25.0000 ug | MEDICATED_PATCH | TRANSDERMAL | 0 refills | Status: DC

## 2016-03-26 NOTE — Patient Instructions (Signed)
[facility] Discharge Instructions for Patients Receiving Chemotherapy  Today you received the following chemotherapy agents Zometa and Velcade.  To help prevent nausea and vomiting after your treatment, we encourage you to take your nausea medication as prescribed.   If you develop nausea and vomiting that is not controlled by your nausea medication, call the clinic.   BELOW ARE SYMPTOMS THAT SHOULD BE REPORTED IMMEDIATELY:  *FEVER GREATER THAN 100.5 F  *CHILLS WITH OR WITHOUT FEVER  NAUSEA AND VOMITING THAT IS NOT CONTROLLED WITH YOUR NAUSEA MEDICATION  *UNUSUAL SHORTNESS OF BREATH  *UNUSUAL BRUISING OR BLEEDING  TENDERNESS IN MOUTH AND THROAT WITH OR WITHOUT PRESENCE OF ULCERS  *URINARY PROBLEMS  *BOWEL PROBLEMS  UNUSUAL RASH Items with * indicate a potential emergency and should be followed up as soon as possible.  Feel free to call the clinic you have any questions or concerns. The clinic phone number is [phone].  Please show the [facility] at check-in to the Emergency Department and triage nurse.

## 2016-03-26 NOTE — Progress Notes (Signed)
Hematology and Oncology Follow Up Visit  Mccrae Speciale [id number] Apr 24, 1956 60 y.o. 03/26/2016   Principle Diagnosis:  IgG Kappa myeloma - Hyperdiploid/+11 DVT of the LEFT and RIGHT leg Current Therapy:    S/p cycle #1 of RVD  Zometa 4 mg IV q month Xarelto 2o mg po q day - started on 03/21/2016     Interim History:  [name] is back for follow-up. We actually saw him last week. He had a blood clot. This is mostly in the left leg. He also had a little bit in the right leg. We started him on therapy with Xarelto.   As far as the chemotherapy goes for the myeloma, he is done pretty well. He's tolerated treatment nicely. He's had no problems with the Revlimid.  He's had no issues with tingling in the hands or feet.  He does have the compression fractures in his back. He's had some more compression fractures. He's had kyphoplasty for this. He says his back is feeling a little bit better.   He's had no issues with rashes. He's had no fever. He's had no cough. He's had no nausea or vomiting.   Overall, his performance status is ECOG 1.  Medications:  Current Outpatient Prescriptions:  .  acetaminophen (TYLENOL) 500 MG tablet, Take 500 mg by mouth every 6 (six) hours as needed., Disp: , Rfl:  .  carisoprodol (SOMA) 350 MG tablet, Take 1 tablet (350 mg total) by mouth 4 (four) times daily as needed for muscle spasms., Disp: 90 tablet, Rfl: 3 .  Cholecalciferol (VITAMIN D PO), Take 1 tablet by mouth daily., Disp: , Rfl:  .  dexamethasone (DECADRON) 4 MG tablet, Take 5 pills at one time with food every week, Disp: 60 tablet, Rfl: 4 .  famciclovir (FAMVIR) 500 MG tablet, Take 1 tablet (500 mg total) by mouth daily., Disp: 30 tablet, Rfl: 8 .  fentaNYL (DURAGESIC - DOSED MCG/HR) 25 MCG/HR patch, Place 1 patch (25 mcg total) onto the skin every 3 (three) days., Disp: 10 patch, Rfl: 0 .  HYDROcodone-acetaminophen (NORCO) 10-325 MG tablet, Take 0.5-1 tablets by mouth every 8 (eight) hours as  needed for moderate pain., Disp: 60 tablet, Rfl: 0 .  ibuprofen (ADVIL,MOTRIN) 400 MG tablet, Take 400 mg by mouth every 6 (six) hours as needed for mild pain. , Disp: , Rfl:  .  lenalidomide (REVLIMID) 25 MG capsule, Take 1 capsule daily for 21 days on rthen 7 days off. Auth # [id number], Disp: 21 capsule, Rfl: 6 .  lidocaine-prilocaine (EMLA) cream, Apply to affected area once, Disp: 30 g, Rfl: 3 .  LORazepam (ATIVAN) 0.5 MG tablet, Take 1 tablet (0.5 mg total) by mouth every 6 (six) hours as needed (Nausea or vomiting)., Disp: 30 tablet, Rfl: 0 .  magnesium oxide (MAG-OX) 400 MG tablet, Take 400 mg by mouth daily., Disp: , Rfl:  .  ondansetron (ZOFRAN) 8 MG tablet, Take 1 tablet (8 mg total) by mouth 2 (two) times daily as needed (Nausea or vomiting)., Disp: 30 tablet, Rfl: 1 .  prochlorperazine (COMPAZINE) 10 MG tablet, Take 1 tablet (10 mg total) by mouth every 6 (six) hours as needed for nausea or vomiting., Disp: 30 tablet, Rfl: 3 .  [START ON 04/18/2016] rivaroxaban (XARELTO) 20 MG TABS tablet, Take 1 tablet (20 mg total) by mouth daily with supper., Disp: 30 tablet, Rfl: 4 .  Rivaroxaban 15 & 20 MG TBPK, Take as directed on package: Start with one 40m tablet by mouth  twice a day with food. On Day 22, switch to one 56m tablet once a day with food., Disp: 51 each, Rfl: 0 .  sertraline (ZOLOFT) 100 MG tablet, Take 100 mg by mouth daily., Disp: , Rfl: 2 .  zolpidem (AMBIEN) 10 MG tablet, Take 1 tablet (10 mg total) by mouth at bedtime as needed., Disp: 30 tablet, Rfl: 1  Allergies: No Known Allergies  Past Medical History, Surgical history, Social history, and Family History were reviewed and updated.  Review of Systems: As above  Physical Exam:  height is '5\' 10"'  (1.778 m) and weight is 172 lb (78 kg). His oral temperature is 97.7 F (36.5 C). His blood pressure is 125/84 and his pulse is 70. His respiration is 16.   Wt Readings from Last 3 Encounters:  03/26/16 172 lb (78 kg)  03/13/16  175 lb (79.4 kg)  03/12/16 180 lb (81.6 kg)     Head and neck exam shows no ocular or oral lesions. He has no palpable cervical or supraclavicular lymph nodes. He has no palpable thyroid. Lungs are clear bilaterally. Cardiac exam regular rate and rhythm with no murmurs, rubs or bruits. Abdomen is soft. He has good bowel sounds. There is no fluid wave. There is no palpable liver or spleen tip. Back exam shows some tenderness in the midthoracic spine. There is some tenderness along the right thoracic paravertebral muscles. Extremities shows no clubbing, cyanosis or edema. He has good range of motion of his joints. He has good strength in his extremities. Neurological exam shows no focal neurological deficits. Skin exam shows no rashes, ecchymoses or petechia.   Lab Results  Component Value Date   WBC 5.4 03/26/2016   HGB 13.6 03/26/2016   HCT 39.5 03/26/2016   MCV 97 03/26/2016   PLT 331 03/26/2016     Chemistry      Component Value Date/Time   NA 130 03/26/2016 1124   NA 136 02/22/2016 0804   K 4.0 03/26/2016 1124   K 3.8 02/22/2016 0804   CL 104 03/26/2016 1124   CO2 22 03/26/2016 1124   CO2 22 02/22/2016 0804   BUN 18 03/26/2016 1124   BUN 16.4 02/22/2016 0804   CREATININE 1.0 03/26/2016 1124   CREATININE 1.0 02/22/2016 0804      Component Value Date/Time   CALCIUM 8.8 03/26/2016 1124   CALCIUM 9.8 02/22/2016 0804   ALKPHOS 98 (H) 03/26/2016 1124   ALKPHOS 95 02/22/2016 0804   AST 30 03/26/2016 1124   AST 19 02/22/2016 0804   ALT 36 03/26/2016 1124   ALT 24 02/22/2016 0804   BILITOT 0.70 03/26/2016 1124   BILITOT 0.41 02/22/2016 0804         Impression and Plan: Mr. PElkis a 60year old white male. He actually looks to be in good shape. He has IgG Kappa Myeloma.  We will proceed with his second cycle of chemotherapy. He is a very well so far. We will see what his myeloma studies show.  He does have the thromboembolic disease in his legs. We are sending off a  hypercoagulable panel on him to make sure there is nothing that is thrombophilic.   It is still too early to see how things will go. Hope, he will be more active and not have to use the walker as he continues to heal.   We will continue him on the Zometa. He gets this monthly.   Our ultimate goal is a stem cell transplant. I think  that we will be able to get him there.   Overall, I spent about 25 minutes with he and his wife.   I will plan to see him back myself in another month.    [name], MD 9/13/201712:31 PM

## 2016-03-27 ENCOUNTER — Ambulatory Visit (HOSPITAL_COMMUNITY): Admission: RE | Admit: 2016-03-27 | Payer: 59 | Source: Ambulatory Visit

## 2016-03-27 LAB — KAPPA/LAMBDA LIGHT CHAINS
IG LAMBDA FREE LIGHT CHAIN: 5.9 mg/L (ref 5.7–26.3)
Ig Kappa Free Light Chain: 21 mg/L — ABNORMAL HIGH (ref 3.3–19.4)
KAPPA/LAMBDA FLC RATIO: 3.56 — AB (ref 0.26–1.65)

## 2016-03-27 LAB — BETA 2 MICROGLOBULIN, SERUM: Beta-2: 2.3 mg/L (ref 0.6–2.4)

## 2016-03-31 ENCOUNTER — Ambulatory Visit (HOSPITAL_COMMUNITY)
Admission: RE | Admit: 2016-03-31 | Discharge: 2016-03-31 | Disposition: A | Discharge status: Home / Self Care | Payer: 59 | Source: Ambulatory Visit | Attending: Interventional Radiology | Admitting: Interventional Radiology

## 2016-03-31 DIAGNOSIS — IMO0002 Reserved for concepts with insufficient information to code with codable children: Secondary | ICD-10-CM

## 2016-03-31 HISTORY — PX: IR GENERIC HISTORICAL: IMG1180011

## 2016-03-31 LAB — MULTIPLE MYELOMA PANEL, SERUM
ALBUMIN SERPL ELPH-MCNC: 3.5 g/dL (ref 2.9–4.4)
ALBUMIN/GLOB SERPL: 0.9 (ref 0.7–1.7)
Alpha 1: 0.3 g/dL (ref 0.0–0.4)
Alpha2 Glob SerPl Elph-Mcnc: 0.9 g/dL (ref 0.4–1.0)
B-GLOBULIN SERPL ELPH-MCNC: 0.9 g/dL (ref 0.7–1.3)
GAMMA GLOB SERPL ELPH-MCNC: 1.9 g/dL — AB (ref 0.4–1.8)
GLOBULIN, TOTAL: 4.1 g/dL — AB (ref 2.2–3.9)
IGA/IMMUNOGLOBULIN A, SERUM: 25 mg/dL — AB (ref 90–386)
IgG, Qn, Serum: 2174 mg/dL — ABNORMAL HIGH (ref 700–1600)
IgM, Qn, Serum: 31 mg/dL (ref 20–172)
M PROTEIN SERPL ELPH-MCNC: 1.7 g/dL — AB
Total Protein: 7.6 g/dL (ref 6.0–8.5)

## 2016-04-01 ENCOUNTER — Ambulatory Visit (HOSPITAL_COMMUNITY): Payer: 59

## 2016-04-01 ENCOUNTER — Encounter (HOSPITAL_COMMUNITY): Payer: Self-pay | Admitting: Interventional Radiology

## 2016-04-01 ENCOUNTER — Telehealth: Payer: Self-pay

## 2016-04-01 LAB — CARDIOLIPIN ANTIBODIES, IGG, IGM, IGA
Anticardiolipin Ab,IgA,Qn: <9 APL U/mL (ref 0–11)
Anticardiolipin Ab,IgM,Qn: <9 MPL U/mL (ref 0–12)

## 2016-04-01 LAB — PROTHROMBIN GENE MUTATION

## 2016-04-01 LAB — PROTEIN C, TOTAL: Protein C Antigen: 98 % (ref 60–150)

## 2016-04-01 LAB — LUPUS ANTICOAGULANT PANEL
PTT-LA: 31.2 s (ref 0.0–51.9)
dRVVT: 39.2 s (ref 0.0–47.0)

## 2016-04-01 LAB — PROTEIN C ACTIVITY: Protein C-Functional: 135 % (ref 73–180)

## 2016-04-01 LAB — PROTEIN S, TOTAL: PROTEIN S AG TOTAL: 118 % (ref 60–150)

## 2016-04-01 LAB — FACTOR 5 LEIDEN

## 2016-04-01 LAB — BETA-2-GLYCOPROTEIN I ABS, IGG/M/A
Beta-2 Glyco 1 IgA: <9 GPI IgA units (ref 0–25)
Beta-2 Glyco 1 IgM: <9 GPI IgM units (ref 0–32)
Beta-2 Glycoprotein I Ab, IgG: <9 GPI IgG units (ref 0–20)

## 2016-04-01 LAB — ANTITHROMBIN III: Antithrombin Activity: 137 % — ABNORMAL HIGH (ref 75–135)

## 2016-04-01 LAB — PROTEIN S ACTIVITY: Protein S-Functional: 135 % (ref 63–140)

## 2016-04-01 NOTE — Telephone Encounter (Addendum)
-----   Message from [name], MD sent at 03/31/2016  4:45 PM EDT ----- Call - the myeloma levels have come down by 50% already!!!  This is tremendous!!!  Keep up the good work!!  [employer] given to pt via phone who verbalizes understanding and appreciations.

## 2016-04-02 ENCOUNTER — Other Ambulatory Visit (HOSPITAL_BASED_OUTPATIENT_CLINIC_OR_DEPARTMENT_OTHER): Payer: 59

## 2016-04-02 ENCOUNTER — Ambulatory Visit (HOSPITAL_BASED_OUTPATIENT_CLINIC_OR_DEPARTMENT_OTHER): Payer: 59

## 2016-04-02 ENCOUNTER — Encounter: Payer: Self-pay | Admitting: Nurse Practitioner

## 2016-04-02 ENCOUNTER — Ambulatory Visit: Payer: 59 | Admitting: Physician Assistant

## 2016-04-02 VITALS — BP 120/70 | HR 69 | Temp 98.2°F | Resp 20

## 2016-04-02 DIAGNOSIS — Z5112 Encounter for antineoplastic immunotherapy: Secondary | ICD-10-CM | POA: Diagnosis not present

## 2016-04-02 DIAGNOSIS — C9000 Multiple myeloma not having achieved remission: Secondary | ICD-10-CM

## 2016-04-02 LAB — CBC WITH DIFFERENTIAL (CANCER CENTER ONLY)
BASO#: 0 10*3/uL (ref 0.0–0.2)
BASO%: 0.8 % (ref 0.0–2.0)
EOS ABS: 0.3 10*3/uL (ref 0.0–0.5)
EOS%: 5.1 % (ref 0.0–7.0)
HCT: 40.4 % (ref 38.7–49.9)
HEMOGLOBIN: 13.8 g/dL (ref 13.0–17.1)
LYMPH#: 1.4 10*3/uL (ref 0.9–3.3)
LYMPH%: 27.2 % (ref 14.0–48.0)
MCH: 33.5 pg — AB (ref 28.0–33.4)
MCHC: 34.2 g/dL (ref 32.0–35.9)
MCV: 98 fL (ref 82–98)
MONO#: 0.5 10*3/uL (ref 0.1–0.9)
MONO%: 9.8 % (ref 0.0–13.0)
NEUT%: 57.1 % (ref 40.0–80.0)
NEUTROS ABS: 2.9 10*3/uL (ref 1.5–6.5)
PLATELETS: 227 10*3/uL (ref 145–400)
RBC: 4.12 10*6/uL — AB (ref 4.20–5.70)
RDW: 13.5 % (ref 11.1–15.7)
WBC: 5.1 10*3/uL (ref 4.0–10.0)

## 2016-04-02 LAB — CMP (CANCER CENTER ONLY)
ALK PHOS: 105 U/L — AB (ref 26–84)
ALT: 48 U/L — AB (ref 10–47)
AST: 37 U/L (ref 11–38)
Albumin: 4.1 g/dL (ref 3.3–5.5)
BILIRUBIN TOTAL: 0.8 mg/dL (ref 0.20–1.60)
BUN: 13 mg/dL (ref 7–22)
CO2: 25 mEq/L (ref 18–33)
CREATININE: 0.8 mg/dL (ref 0.6–1.2)
Calcium: 8.8 mg/dL (ref 8.0–10.3)
Chloride: 107 mEq/L (ref 98–108)
Glucose, Bld: 95 mg/dL (ref 73–118)
POTASSIUM: 4.4 meq/L (ref 3.3–4.7)
SODIUM: 135 meq/L (ref 128–145)
TOTAL PROTEIN: 8.3 g/dL — AB (ref 6.4–8.1)

## 2016-04-02 LAB — TECHNOLOGIST REVIEW CHCC SATELLITE

## 2016-04-02 MED ORDER — BORTEZOMIB CHEMO SQ INJECTION 3.5 MG (2.5MG/ML)
1.3000 mg/m2 | Freq: Once | INTRAMUSCULAR | Status: AC
  Administered 2016-04-02: 2.5 mg via SUBCUTANEOUS
  Filled 2016-04-02: qty 2.5

## 2016-04-02 MED ORDER — PROCHLORPERAZINE MALEATE 10 MG PO TABS
ORAL_TABLET | ORAL | Status: AC
  Filled 2016-04-02: qty 1

## 2016-04-02 MED ORDER — PROCHLORPERAZINE MALEATE 10 MG PO TABS
10.0000 mg | ORAL_TABLET | Freq: Once | ORAL | Status: AC
  Administered 2016-04-02: 10 mg via ORAL

## 2016-04-02 NOTE — Patient Instructions (Signed)
Bortezomib injection What is this medicine? BORTEZOMIB (bor TEZ oh mib) is a medicine that targets proteins in cancer cells and stops the cancer cells from growing. It is used to treat multiple myeloma and mantle-cell lymphoma. This medicine may be used for other purposes; ask your health care provider or pharmacist if you have questions. What should I tell my health care provider before I take this medicine? They need to know if you have any of these conditions: -diabetes -heart disease -irregular heartbeat -liver disease -on hemodialysis -low blood counts, like low white blood cells, platelets, or hemoglobin -peripheral neuropathy -taking medicine for blood pressure -an unusual or allergic reaction to bortezomib, mannitol, boron, other medicines, foods, dyes, or preservatives -pregnant or trying to get pregnant -breast-feeding How should I use this medicine? This medicine is for injection into a vein or for injection under the skin. It is given by a health care professional in a hospital or clinic setting. Talk to your pediatrician regarding the use of this medicine in children. Special care may be needed. Overdosage: If you think you have taken too much of this medicine contact a poison control center or emergency room at once. NOTE: This medicine is only for you. Do not share this medicine with others. What if I miss a dose? It is important not to miss your dose. Call your doctor or health care professional if you are unable to keep an appointment. What may interact with this medicine? This medicine may interact with the following medications: -ketoconazole -rifampin -ritonavir -St. John's Wort This list may not describe all possible interactions. Give your health care provider a list of all the medicines, herbs, non-prescription drugs, or dietary supplements you use. Also tell them if you smoke, drink alcohol, or use illegal drugs. Some items may interact with your medicine. What  should I watch for while using this medicine? Visit your doctor for checks on your progress. This drug may make you feel generally unwell. This is not uncommon, as chemotherapy can affect healthy cells as well as cancer cells. Report any side effects. Continue your course of treatment even though you feel ill unless your doctor tells you to stop. You may get drowsy or dizzy. Do not drive, use machinery, or do anything that needs mental alertness until you know how this medicine affects you. Do not stand or sit up quickly, especially if you are an older patient. This reduces the risk of dizzy or fainting spells. In some cases, you may be given additional medicines to help with side effects. Follow all directions for their use. Call your doctor or health care professional for advice if you get a fever, chills or sore throat, or other symptoms of a cold or flu. Do not treat yourself. This drug decreases your body's ability to fight infections. Try to avoid being around people who are sick. This medicine may increase your risk to bruise or bleed. Call your doctor or health care professional if you notice any unusual bleeding. You may need blood work done while you are taking this medicine. In some patients, this medicine may cause a serious brain infection that may cause death. If you have any problems seeing, thinking, speaking, walking, or standing, tell your doctor right away. If you cannot reach your doctor, urgently seek other source of medical care. Do not become pregnant while taking this medicine. Women should inform their doctor if they wish to become pregnant or think they might be pregnant. There is a potential for serious  side effects to an unborn child. Talk to your health care professional or pharmacist for more information. Do not breast-feed an infant while taking this medicine. Check with your doctor or health care professional if you get an attack of severe diarrhea, nausea and vomiting, or if  you sweat a lot. The loss of too much body fluid can make it dangerous for you to take this medicine. What side effects may I notice from receiving this medicine? Side effects that you should report to your doctor or health care professional as soon as possible: -allergic reactions like skin rash, itching or hives, swelling of the face, lips, or tongue -breathing problems -changes in hearing -changes in vision -fast, irregular heartbeat -feeling faint or lightheaded, falls -pain, tingling, numbness in the hands or feet -right upper belly pain -seizures -swelling of the ankles, feet, hands -unusual bleeding or bruising -unusually weak or tired -vomiting -yellowing of the eyes or skin Side effects that usually do not require medical attention (report to your doctor or health care professional if they continue or are bothersome): -changes in emotions or moods -constipation -diarrhea -loss of appetite -headache -irritation at site where injected -nausea This list may not describe all possible side effects. Call your doctor for medical advice about side effects. You may report side effects to FDA at 1-800-FDA-1088. Where should I keep my medicine? This drug is given in a hospital or clinic and will not be stored at home. NOTE: This sheet is a summary. It may not cover all possible information. If you have questions about this medicine, talk to your doctor, pharmacist, or health care provider.    2016, Elsevier/Gold Standard. (2014-08-29 14:47:04)

## 2016-04-07 ENCOUNTER — Telehealth: Payer: Self-pay | Admitting: Family Medicine

## 2016-04-07 NOTE — Telephone Encounter (Signed)
Appointment rescheduled.

## 2016-04-07 NOTE — Telephone Encounter (Signed)
Called patient and left voice message for patient to call the office back. Received a voicemail from patient to reschedule appointment from 04/02/16.

## 2016-04-09 ENCOUNTER — Encounter: Payer: Self-pay | Admitting: Physician Assistant

## 2016-04-09 ENCOUNTER — Other Ambulatory Visit (HOSPITAL_BASED_OUTPATIENT_CLINIC_OR_DEPARTMENT_OTHER): Payer: 59

## 2016-04-09 ENCOUNTER — Ambulatory Visit (HOSPITAL_BASED_OUTPATIENT_CLINIC_OR_DEPARTMENT_OTHER): Payer: 59

## 2016-04-09 ENCOUNTER — Telehealth: Payer: Self-pay | Admitting: Physician Assistant

## 2016-04-09 ENCOUNTER — Ambulatory Visit (INDEPENDENT_AMBULATORY_CARE_PROVIDER_SITE_OTHER): Payer: 59 | Admitting: Physician Assistant

## 2016-04-09 VITALS — BP 106/66 | HR 70 | Temp 98.0°F | Resp 16 | Ht 70.0 in | Wt 173.1 lb

## 2016-04-09 VITALS — BP 119/71 | HR 70 | Temp 98.4°F | Resp 20

## 2016-04-09 DIAGNOSIS — Z23 Encounter for immunization: Secondary | ICD-10-CM

## 2016-04-09 DIAGNOSIS — G8929 Other chronic pain: Secondary | ICD-10-CM

## 2016-04-09 DIAGNOSIS — G4701 Insomnia due to medical condition: Secondary | ICD-10-CM | POA: Diagnosis not present

## 2016-04-09 DIAGNOSIS — Z5112 Encounter for antineoplastic immunotherapy: Secondary | ICD-10-CM

## 2016-04-09 DIAGNOSIS — C9000 Multiple myeloma not having achieved remission: Secondary | ICD-10-CM | POA: Diagnosis not present

## 2016-04-09 LAB — CMP (CANCER CENTER ONLY)
ALBUMIN: 4 g/dL (ref 3.3–5.5)
ALK PHOS: 96 U/L — AB (ref 26–84)
ALT: 38 U/L (ref 10–47)
AST: 27 U/L (ref 11–38)
BUN, Bld: 12 mg/dL (ref 7–22)
CO2: 26 mEq/L (ref 18–33)
Calcium: 9 mg/dL (ref 8.0–10.3)
Chloride: 101 mEq/L (ref 98–108)
Creat: 1 mg/dl (ref 0.6–1.2)
GLUCOSE: 131 mg/dL — AB (ref 73–118)
POTASSIUM: 4.2 meq/L (ref 3.3–4.7)
SODIUM: 132 meq/L (ref 128–145)
TOTAL PROTEIN: 8.1 g/dL (ref 6.4–8.1)
Total Bilirubin: 1 mg/dl (ref 0.20–1.60)

## 2016-04-09 LAB — CBC WITH DIFFERENTIAL (CANCER CENTER ONLY)
BASO#: 0 10*3/uL (ref 0.0–0.2)
BASO%: 0.3 % (ref 0.0–2.0)
EOS%: 3.2 % (ref 0.0–7.0)
Eosinophils Absolute: 0.2 10*3/uL (ref 0.0–0.5)
HEMATOCRIT: 42.2 % (ref 38.7–49.9)
HEMOGLOBIN: 14.4 g/dL (ref 13.0–17.1)
LYMPH#: 1.5 10*3/uL (ref 0.9–3.3)
LYMPH%: 21.8 % (ref 14.0–48.0)
MCH: 33.5 pg — ABNORMAL HIGH (ref 28.0–33.4)
MCHC: 34.1 g/dL (ref 32.0–35.9)
MCV: 98 fL (ref 82–98)
MONO#: 0.6 10*3/uL (ref 0.1–0.9)
MONO%: 8.6 % (ref 0.0–13.0)
NEUT%: 66.1 % (ref 40.0–80.0)
NEUTROS ABS: 4.5 10*3/uL (ref 1.5–6.5)
Platelets: 194 10*3/uL (ref 145–400)
RBC: 4.3 10*6/uL (ref 4.20–5.70)
RDW: 13.5 % (ref 11.1–15.7)
WBC: 6.8 10*3/uL (ref 4.0–10.0)

## 2016-04-09 MED ORDER — ZOLPIDEM TARTRATE 10 MG PO TABS: 10.0000 mg | ORAL_TABLET | Freq: Every evening | ORAL | 3 refills | Status: DC | PRN

## 2016-04-09 MED ORDER — BORTEZOMIB CHEMO SQ INJECTION 3.5 MG (2.5MG/ML)
1.3000 mg/m2 | Freq: Once | INTRAMUSCULAR | Status: AC
  Administered 2016-04-09: 2.5 mg via SUBCUTANEOUS
  Filled 2016-04-09: qty 2.5

## 2016-04-09 MED ORDER — PROCHLORPERAZINE MALEATE 10 MG PO TABS
ORAL_TABLET | ORAL | Status: AC
  Filled 2016-04-09: qty 1

## 2016-04-09 MED ORDER — PROCHLORPERAZINE MALEATE 10 MG PO TABS
10.0000 mg | ORAL_TABLET | Freq: Once | ORAL | Status: AC
  Administered 2016-04-09: 10 mg via ORAL

## 2016-04-09 NOTE — Progress Notes (Signed)
Patient presents to clinic today to establish care.  Acute Concerns: Patient requesting flu shot today. Has been given verbal approval by his Oncologist.   Chronic Issues: Multiple Myeloma -- followed by Oncology, Dr. Marin Olp. Is currently undergoing chemotherapy -- once weekly infusion x 3 weeks, then 1 week rest before resuming. Is doing very well thus far. Has noted fatigue since starting chemotherapy.  Insomnia -- chronic. Patient has been on ambien 5 mg for some time with good relief. Only takes when really needed, 3-4 x week. Denies side effect of medication. Is requesting refill.   Past Medical History:  Diagnosis Date  . Anxiety   . Depression   . Headache    mild at times  . History of chicken pox   . Measles   . Multiple myeloma ([place]) 02/22/2016  . Myeloma ([place]) 2017    Past Surgical History:  Procedure Laterality Date  . COLONOSCOPY  [id number]  . INGUINAL HERNIA REPAIR  12/16/13   BIH by Dr. Ayaan Boston  . IR GENERIC HISTORICAL  02/11/2016   IR RADIOLOGIST EVAL & MGMT 02/11/2016 [facility]  . IR GENERIC HISTORICAL  02/15/2016   IR BONE TUMOR(S)RF ABLATION 02/15/2016 Luanne Bras, MD [facility]  . IR GENERIC HISTORICAL  02/15/2016   IR BONE TUMOR(S)RF ABLATION 02/15/2016 Luanne Bras, MD [facility]  . IR GENERIC HISTORICAL  02/15/2016   IR BONE TUMOR(S)RF ABLATION 02/15/2016 Luanne Bras, MD [facility]  . IR GENERIC HISTORICAL  02/15/2016   IR KYPHO THORACIC WITH BONE BIOPSY 02/15/2016 Luanne Bras, MD [facility]  . IR GENERIC HISTORICAL  02/15/2016   IR KYPHO THORACIC WITH BONE BIOPSY 02/15/2016 Luanne Bras, MD [facility]  . IR GENERIC HISTORICAL  02/15/2016   IR VERTEBROPLASTY CERV/THOR BX INC UNI/BIL INC/INJECT/IMAGING 02/15/2016 Luanne Bras, MD [facility]  . IR GENERIC HISTORICAL  03/13/2016   IR KYPHO EA ADDL LEVEL THORACIC OR LUMBAR 03/13/2016 Luanne Bras, MD [facility]  . IR GENERIC HISTORICAL  03/13/2016   IR KYPHO EA  ADDL LEVEL THORACIC OR LUMBAR 03/13/2016 Luanne Bras, MD [facility]  . IR GENERIC HISTORICAL  03/13/2016   IR BONE TUMOR(S)RF ABLATION 03/13/2016 Luanne Bras, MD [facility]  . IR GENERIC HISTORICAL  03/13/2016   IR KYPHO LUMBAR INC FX REDUCE BONE BX UNI/BIL CANNULATION INC/IMAGING 03/13/2016 Luanne Bras, MD [facility]  . IR GENERIC HISTORICAL  03/13/2016   IR BONE TUMOR(S)RF ABLATION 03/13/2016 Luanne Bras, MD [facility]  . IR GENERIC HISTORICAL  03/13/2016   IR BONE TUMOR(S)RF ABLATION 03/13/2016 Luanne Bras, MD [facility]  . IR GENERIC HISTORICAL  03/31/2016   IR RADIOLOGIST EVAL & MGMT 03/31/2016 [facility]  . RADIOLOGY WITH ANESTHESIA N/A 02/15/2016   Procedure: Spinal Ablation;  Surgeon: Luanne Bras, MD;  Location: [place];  Service: Radiology;  Laterality: N/A;  . RADIOLOGY WITH ANESTHESIA N/A 03/13/2016   Procedure: LUMBER ABLATION;  Surgeon: Luanne Bras, MD;  Location: [place];  Service: Radiology;  Laterality: N/A;  . ROTATOR CUFF REPAIR  2003  . TONSILLECTOMY    . WISDOM TOOTH EXTRACTION      Current Outpatient Prescriptions on File Prior to Visit  Medication Sig Dispense Refill  . acetaminophen (TYLENOL) 500 MG tablet Take 500 mg by mouth every 6 (six) hours as needed.    . carisoprodol (SOMA) 350 MG tablet Take 1 tablet (350 mg total) by mouth 4 (four) times daily as needed for muscle spasms. 90 tablet 3  . Cholecalciferol (  VITAMIN D PO) Take 5,000 Units by mouth daily.     [name] dexamethasone (DECADRON) 4 MG tablet Take 5 pills at one time with food every week 60 tablet 4  . famciclovir (FAMVIR) 500 MG tablet Take 1 tablet (500 mg total) by mouth daily. 30 tablet 8  . fentaNYL (DURAGESIC - DOSED MCG/HR) 25 MCG/HR patch Place 1 patch (25 mcg total) onto the skin every 3 (three) days. 10 patch 0  . HYDROcodone-acetaminophen (NORCO) 10-325 MG tablet Take 0.5-1 tablets by mouth every 8 (eight) hours as needed for moderate pain. 60 tablet 0    . ibuprofen (ADVIL,MOTRIN) 400 MG tablet Take 400 mg by mouth every 6 (six) hours as needed for mild pain.     [name] lenalidomide (REVLIMID) 25 MG capsule Take 1 capsule daily for 21 days on rthen 7 days off. Auth # [id number] 21 capsule 6  . lidocaine-prilocaine (EMLA) cream Apply to affected area once 30 g 3  . LORazepam (ATIVAN) 0.5 MG tablet Take 1 tablet (0.5 mg total) by mouth every 6 (six) hours as needed (Nausea or vomiting). 30 tablet 0  . magnesium oxide (MAG-OX) 400 MG tablet Take 400 mg by mouth daily.    . ondansetron (ZOFRAN) 8 MG tablet Take 1 tablet (8 mg total) by mouth 2 (two) times daily as needed (Nausea or vomiting). 30 tablet 1  . polyethylene glycol powder (MIRALAX) powder Take 17 g by mouth daily. 850 g 3  . prochlorperazine (COMPAZINE) 10 MG tablet Take 1 tablet (10 mg total) by mouth every 6 (six) hours as needed for nausea or vomiting. 30 tablet 3  . Rivaroxaban 15 & 20 MG TBPK Take as directed on package: Start with one 65m tablet by mouth twice a day with food. On Day 22, switch to one 235mtablet once a day with food. 51 each 0  . sertraline (ZOLOFT) 100 MG tablet Take 100 mg by mouth daily.  2  . zolpidem (AMBIEN) 10 MG tablet Take 1 tablet (10 mg total) by mouth at bedtime as needed. 30 tablet 1  . [START ON 04/18/2016] rivaroxaban (XARELTO) 20 MG TABS tablet Take 1 tablet (20 mg total) by mouth daily with supper. (Patient not taking: Reported on 04/09/2016) 30 tablet 4   No current facility-administered medications on file prior to visit.     No Known Allergies  Family History  Problem Relation Age of Onset  . Cancer Mother     ovarian and colon  . Heart disease Father   . Hypertension Father   . Multiple sclerosis Sister   . Paranoid behavior Brother   . Drug abuse Brother   . Stroke Maternal Grandfather   . Cancer Maternal Aunt   . Leukemia Paternal Aunt   . Healthy Son     x1  . Healthy Daughter     x2  . Allergies Daughter     x1  . Diabetes Neg Hx    . Alzheimer's disease Neg Hx   . Parkinson's disease Neg Hx     Social History   Social History  . Marital status: Married    Spouse name: N/A  . Number of children: N/A  . Years of education: N/A   Occupational History  . Not on file.   Social History Main Topics  . Smoking status: Former Smoker    Quit date: 11/02/1980  . Smokeless tobacco: Never Used  . Alcohol use Yes     Comment: 1 drink a day  .  Drug use: No  . Sexual activity: Not on file   Other Topics Concern  . Not on file   Social History Narrative  . No narrative on file    Review of Systems  Constitutional: Positive for malaise/fatigue. Negative for fever and weight loss.  HENT: Negative for ear discharge, ear pain, hearing loss and tinnitus.   Eyes: Negative for blurred vision, double vision, photophobia and pain.  Respiratory: Negative for cough and shortness of breath.   Cardiovascular: Negative for chest pain and palpitations.  Gastrointestinal: Negative for abdominal pain, blood in stool, constipation, diarrhea, heartburn, melena, nausea and vomiting.  Genitourinary: Negative for dysuria, flank pain, frequency, hematuria and urgency.  Musculoskeletal: Negative for falls.  Neurological: Negative for dizziness, loss of consciousness and headaches.  Endo/Heme/Allergies: Negative for environmental allergies.  Psychiatric/Behavioral: Negative for depression, hallucinations, substance abuse and suicidal ideas. The patient has insomnia. The patient is not nervous/anxious.     BP (!) 0/0 (Patient Position: Sitting, Cuff Size: Large)   Ht _0  (1.778 m)   Wt 173 lb 2 oz (78.5 kg)   BMI 24.84 kg/m   Physical Exam  Constitutional: He is oriented to person, place, and time and well-developed, well-nourished, and in no distress.  HENT:  Head: Normocephalic and atraumatic.  Eyes: Conjunctivae are normal.  Neck: Neck supple.  Cardiovascular: Normal rate, regular rhythm, normal heart sounds and intact  distal pulses.   Pulmonary/Chest: Effort normal and breath sounds normal.  Neurological: He is alert and oriented to person, place, and time.  Skin: Skin is warm and dry. No rash noted.  Psychiatric: Affect normal.  Vitals reviewed.   Recent Results (from the past 2160 hour(s))  APTT     Status: None   Collection Time: 01/25/16  6:40 AM  Result Value Ref Range   aPTT 27 24 - 37 seconds  Basic metabolic panel     Status: Abnormal   Collection Time: 01/25/16  6:40 AM  Result Value Ref Range   Sodium 133 (L) 135 - 145 mmol/L   Potassium 4.0 3.5 - 5.1 mmol/L   Chloride 101 101 - 111 mmol/L   CO2 24 22 - 32 mmol/L   Glucose, Bld 106 (H) 65 - 99 mg/dL   BUN 12 6 - 20 mg/dL   Creatinine, Ser 0.84 0.61 - 1.24 mg/dL   Calcium 10.1 8.9 - 10.3 mg/dL   GFR calc non Af Amer >60 >60 mL/min   GFR calc Af Amer >60 >60 mL/min    Comment: (NOTE) The eGFR has been calculated using the CKD EPI equation. This calculation has not been validated in all clinical situations. eGFR's persistently <60 mL/min signify possible Chronic Kidney Disease.    Anion gap 8 5 - 15  CBC     Status: Abnormal   Collection Time: 01/25/16  6:40 AM  Result Value Ref Range   WBC 5.2 4.0 - 10.5 K/uL   RBC 3.48 (L) 4.22 - 5.81 MIL/uL   Hemoglobin 11.9 (L) 13.0 - 17.0 g/dL   HCT 35.1 (L) 39.0 - 52.0 %   MCV 100.9 (H) 78.0 - 100.0 fL   MCH 34.2 (H) 26.0 - 34.0 pg   MCHC 33.9 30.0 - 36.0 g/dL   RDW 13.3 11.5 - 15.5 %   Platelets 206 150 - 400 K/uL  Protime-INR     Status: Abnormal   Collection Time: 01/25/16  6:40 AM  Result Value Ref Range   Prothrombin Time 15.3 (H) 11.6 - 15.2  seconds   INR 1.19 0.00 - 1.49  CBC with Differential [facility])     Status: Abnormal   Collection Time: 02/08/16  2:20 PM  Result Value Ref Range   WBC 5.9 4.0 - 10.0 10e3/uL   RBC 3.51 (L) 4.20 - 5.70 10e6/uL   HGB 12.2 (L) 13.0 - 17.1 g/dL   HCT 35.9 (L) 38.7 - 49.9 %   MCV 102 (H) 82 - 98 fL   MCH 34.8 (H) 28.0 - 33.4 pg    MCHC 34.0 32.0 - 35.9 g/dL   RDW 13.2 11.1 - 15.7 %   Platelets 256 145 - 400 10e3/uL   NEUT# 2.7 1.5 - 6.5 10e3/uL   LYMPH# 2.6 0.9 - 3.3 10e3/uL   MONO# 0.5 0.1 - 0.9 10e3/uL   Eosinophils Absolute 0.1 0.0 - 0.5 10e3/uL   BASO# 0.0 0.0 - 0.2 10e3/uL   NEUT% 46.0 40.0 - 80.0 %   LYMPH% 44.1 14.0 - 48.0 %   MONO% 7.6 0.0 - 13.0 %   EOS% 2.0 0.0 - 7.0 %   BASO% 0.3 0.0 - 2.0 %  CMP STAT ([facility] only)     Status: Abnormal   Collection Time: 02/08/16  2:20 PM  Result Value Ref Range   Sodium 129 128 - 145 mEq/L   Potassium 4.0 3.3 - 4.7 mEq/L   Chloride 102 98 - 108 mEq/L   CO2 27 18 - 33 mEq/L   Glucose, Bld 88 73 - 118 mg/dL   BUN, Bld 13 7 - 22 mg/dL   Creat 1.1 0.6 - 1.2 mg/dl   Total Bilirubin 0.70 0.20 - 1.60 mg/dl   Alkaline Phosphatase 83 26 - 84 U/L   AST 25 11 - 38 U/L   ALT(SGPT) 34 10 - 47 U/L   Total Protein 9.5 (H) 6.4 - 8.1 g/dL   Albumin 3.6 3.3 - 5.5 g/dL   Calcium 9.7 8.0 - 10.3 mg/dL  IgG, IgA, IgM     Status: Abnormal   Collection Time: 02/08/16  2:20 PM  Result Value Ref Range   IgG, Qn, Serum 4,563 (H) 700 - 1600 mg/dL   IgA, Qn, Serum 25 (L) 90 - 386 mg/dL    Comment: Result confirmed on concentration.   IgM, Qn, Serum 18 (L) 20 - 172 mg/dL    Comment: Result confirmed on concentration.  Kappa/lambda light chains     Status: Abnormal   Collection Time: 02/08/16  2:20 PM  Result Value Ref Range   Ig Kappa Free Light Chain 22.7 (H) 3.3 - 19.4 mg/L   Ig Lambda Free Light Chain 3.3 (L) 5.7 - 26.3 mg/L   Kappa/Lambda FluidC Ratio 6.88 (H) 0.26 - 1.65  Lactate dehydrogenase     Status: Abnormal   Collection Time: 02/08/16  2:20 PM  Result Value Ref Range   LDH 272 (H) 125 - 245 U/L  Beta 2 microglobulin, serum     Status: Abnormal   Collection Time: 02/08/16  2:20 PM  Result Value Ref Range   Beta-2 3.1 (H) 0.6 - 2.4 mg/L  Serum protein electrophoresis with reflex     Status: Abnormal   Collection Time: 02/08/16  2:20 PM  Result  Value Ref Range   Total Protein 9.9 (H) 6.0 - 8.5 g/dL   Albumin 3.9 2.9 - 4.4 g/dL   Alpha 1 0.4 0.0 - 0.4 g/dL   Alpha 2 0.8 0.4 - 1.0 g/dL   Beta 1.1 0.7 -  1.3 g/dL   Gamma Globulin 3.7 (H) 0.4 - 1.8 g/dL   M-Spike, % 3.6 (H) Not Observed g/dL   GLOBULIN, TOTAL 6.0 (H) 2.2 - 3.9 g/dL   A/G Ratio 0.6 (L) 0.7 - 1.7   Please Note: Comment     Comment: Protein electrophoresis scan will follow via computer, mail, or courier delivery.    Interpretation(See Below) .    IFE 1 Comment     Comment: Immunofixation shows IgG monoclonal protein with kappa light chain specificity.   TECHNOLOGIST REVIEW [facility]     Status: None   Collection Time: 02/08/16  2:20 PM  Result Value Ref Range   Tech Review Rare nRBC and meta, Occ myelocyte   IFE/Light Chains/TP Qn, 24-Hr Ur     Status: Abnormal   Collection Time: 02/11/16 10:19 AM  Result Value Ref Range   Free Kappa Lt Chains,Ur 1.22 (L) 1.35 - 24.19 mg/L    Comment: **Results verified by repeat testing** Total Volume: 3000 mL    FR KAPPA LT CH,24HR 4 Undefined mg/24 hr   Free Lambda Lt Chains,Ur 0.07 (L) 0.24 - 6.66 mg/L    Comment: **Results verified by repeat testing**   FR LAMBDA LT CH,24HR 0 Undefined mg/24 hr   Kappa/Lambda Ratio,U 17.43 (H) 2.04 - 10.37   IFE Interpretation:U Comment     Comment: Immunofixation shows IgG monoclonal protein with kappa light chain specificity.    PROTEIN,TOTAL,URINE <4.0 Not Estab. mg/dL   Prot,24hr calculated <120 30 - 150 mg/24 hr  CBC with Differential/Platelet     Status: Abnormal   Collection Time: 02/12/16  7:30 AM  Result Value Ref Range   WBC 4.9 4.0 - 10.5 K/uL   RBC 3.31 (L) 4.22 - 5.81 MIL/uL   Hemoglobin 11.2 (L) 13.0 - 17.0 g/dL   HCT 33.8 (L) 39.0 - 52.0 %   MCV 102.1 (H) 78.0 - 100.0 fL   MCH 33.8 26.0 - 34.0 pg   MCHC 33.1 30.0 - 36.0 g/dL   RDW 14.1 11.5 - 15.5 %   Platelets 270 150 - 400 K/uL   Neutrophils Relative % 43 %   Neutro Abs 2.1 1.7 - 7.7 K/uL    Lymphocytes Relative 44 %   Lymphs Abs 2.2 0.7 - 4.0 K/uL   Monocytes Relative 9 %   Monocytes Absolute 0.4 0.1 - 1.0 K/uL   Eosinophils Relative 3 %   Eosinophils Absolute 0.2 0.0 - 0.7 K/uL   Basophils Relative 1 %   Basophils Absolute 0.0 0.0 - 0.1 K/uL  Bone marrow exam     Status: None   Collection Time: 02/12/16  8:10 AM  Result Value Ref Range   Bone Marrow Exam SEE PATHOLOGY REPORT [id number]   Chromosome Analysis, Bone Marrow     Status: None   Collection Time: 02/12/16  8:10 AM  Result Value Ref Range   Chromosome-Routine SEE SEPARATE REPORT     Comment: Performed at [facility]  Tissue hybridization (bone marrow)-[facility]     Status: None   Collection Time: 02/12/16  8:10 AM  Result Value Ref Range   Tissue hybridization (bone marrow)-[facility] SEE SEPARATE REPORT     Comment: Performed at [facility]  APTT upon arrival     Status: None   Collection Time: 02/15/16 10:00 AM  Result Value Ref Range   aPTT 26 24 - 36 seconds  CBC upon arrival     Status: Abnormal   Collection Time: 02/15/16  10:00 AM  Result Value Ref Range   WBC 5.3 4.0 - 10.5 K/uL   RBC 3.47 (L) 4.22 - 5.81 MIL/uL   Hemoglobin 11.6 (L) 13.0 - 17.0 g/dL   HCT 35.9 (L) 39.0 - 52.0 %   MCV 103.5 (H) 78.0 - 100.0 fL   MCH 33.4 26.0 - 34.0 pg   MCHC 32.3 30.0 - 36.0 g/dL   RDW 14.2 11.5 - 15.5 %   Platelets 239 150 - 400 K/uL  Protime-INR upon arrival     Status: Abnormal   Collection Time: 02/15/16 10:00 AM  Result Value Ref Range   Prothrombin Time 15.5 (H) 11.4 - 15.2 seconds   INR 6.57   Basic metabolic panel     Status: Abnormal   Collection Time: 02/15/16 10:00 AM  Result Value Ref Range   Sodium 134 (L) 135 - 145 mmol/L   Potassium 4.2 3.5 - 5.1 mmol/L   Chloride 105 101 - 111 mmol/L   CO2 25 22 - 32 mmol/L   Glucose, Bld 99 65 - 99 mg/dL   BUN 12 6 - 20 mg/dL   Creatinine, Ser 0.84 0.61 - 1.24 mg/dL   Calcium 9.7 8.9 - 10.3 mg/dL   GFR calc non Af Amer >60 >60 mL/min   GFR calc  Af Amer >60 >60 mL/min    Comment: (NOTE) The eGFR has been calculated using the CKD EPI equation. This calculation has not been validated in all clinical situations. eGFR's persistently <60 mL/min signify possible Chronic Kidney Disease.    Anion gap 4 (L) 5 - 15  Glucose, capillary     Status: None   Collection Time: 02/18/16 12:58 PM  Result Value Ref Range   Glucose-Capillary 98 65 - 99 mg/dL  CBC w/Diff     Status: Abnormal   Collection Time: 02/22/16  8:04 AM  Result Value Ref Range   WBC 5.4 4.0 - 10.0 10e3/uL   RBC 3.78 (L) 4.20 - 5.70 10e6/uL   HGB 13.3 13.0 - 17.1 g/dL   HCT 39.2 38.7 - 49.9 %   MCV 104 (H) 82 - 98 fL   MCH 35.2 (H) 28.0 - 33.4 pg   MCHC 33.9 32.0 - 35.9 g/dL   RDW 14.1 11.1 - 15.7 %   Platelets 229 145 - 400 10e3/uL   NEUT# 3.1 1.5 - 6.5 10e3/uL   LYMPH# 1.9 0.9 - 3.3 10e3/uL   MONO# 0.3 0.1 - 0.9 10e3/uL   Eosinophils Absolute 0.1 0.0 - 0.5 10e3/uL   BASO# 0.0 0.0 - 0.2 10e3/uL   NEUT% 56.4 40.0 - 80.0 %   LYMPH% 34.3 14.0 - 48.0 %   MONO% 6.3 0.0 - 13.0 %   EOS% 2.6 0.0 - 7.0 %   BASO% 0.4 0.0 - 2.0 %  Comprehensive metabolic panel     Status: Abnormal   Collection Time: 02/22/16  8:04 AM  Result Value Ref Range   Sodium 136 136 - 145 mEq/L   Potassium 3.8 3.5 - 5.1 mEq/L   Chloride 107 98 - 109 mEq/L   CO2 22 22 - 29 mEq/L   Glucose 107 70 - 140 mg/dl    Comment: Glucose reference range is for nonfasting patients. Fasting glucose reference range is 70- 100.   BUN 16.4 7.0 - 26.0 mg/dL   Creatinine 1.0 0.7 - 1.3 mg/dL   Total Bilirubin 0.41 0.20 - 1.20 mg/dL   Alkaline Phosphatase 95 40 - 150 U/L   AST 19 5 -  34 U/L   ALT 24 0 - 55 U/L   Total Protein 10.7 (H) 6.4 - 8.3 g/dL   Albumin 3.6 3.5 - 5.0 g/dL   Calcium 9.8 8.4 - 10.4 mg/dL   Anion Gap 7 3 - 11 mEq/L   EGFR 83 (L) >90 ml/min/1.73 m2    Comment: eGFR is calculated using the CKD-EPI Creatinine Equation (2009)  CBC w/Diff     Status: Abnormal   Collection Time: 02/27/16   8:47 AM  Result Value Ref Range   WBC 5.4 4.0 - 10.0 10e3/uL   RBC 3.60 (L) 4.20 - 5.70 10e6/uL   HGB 12.6 (L) 13.0 - 17.1 g/dL   HCT 36.7 (L) 38.7 - 49.9 %   MCV 102 (H) 82 - 98 fL   MCH 35.0 (H) 28.0 - 33.4 pg   MCHC 34.3 32.0 - 35.9 g/dL   RDW 13.6 11.1 - 15.7 %   Platelets 239 145 - 400 10e3/uL   NEUT# 3.1 1.5 - 6.5 10e3/uL   LYMPH# 1.7 0.9 - 3.3 10e3/uL   MONO# 0.4 0.1 - 0.9 10e3/uL   Eosinophils Absolute 0.1 0.0 - 0.5 10e3/uL   BASO# 0.0 0.0 - 0.2 10e3/uL   NEUT% 56.8 40.0 - 80.0 %   LYMPH% 32.2 14.0 - 48.0 %   MONO% 8.0 0.0 - 13.0 %   EOS% 2.6 0.0 - 7.0 %   BASO% 0.4 0.0 - 2.0 %  TECHNOLOGIST REVIEW [facility]     Status: None   Collection Time: 02/27/16  8:47 AM  Result Value Ref Range   Tech Review Rare myelocyte   CMP STAT     Status: Abnormal   Collection Time: 02/27/16  8:47 AM  Result Value Ref Range   Sodium 130 128 - 145 mEq/L   Potassium 4.0 3.3 - 4.7 mEq/L   Chloride 107 98 - 108 mEq/L   CO2 23 18 - 33 mEq/L   Glucose, Bld 122 (H) 73 - 118 mg/dL   BUN, Bld 17 7 - 22 mg/dL   Creat 1.1 0.6 - 1.2 mg/dl   Total Bilirubin 0.70 0.20 - 1.60 mg/dl   Alkaline Phosphatase 89 (H) 26 - 84 U/L   AST 22 11 - 38 U/L   ALT(SGPT) 28 10 - 47 U/L   Total Protein 9.8 (H) 6.4 - 8.1 g/dL   Albumin 3.4 3.3 - 5.5 g/dL   Calcium 9.5 8.0 - 10.3 mg/dL  CBC with Differential [facility])     Status: Abnormal   Collection Time: 03/05/16  8:41 AM  Result Value Ref Range   WBC 4.5 4.0 - 10.0 10e3/uL   RBC 3.87 (L) 4.20 - 5.70 10e6/uL   HGB 13.4 13.0 - 17.1 g/dL   HCT 39.0 38.7 - 49.9 %   MCV 101 (H) 82 - 98 fL   MCH 34.6 (H) 28.0 - 33.4 pg   MCHC 34.4 32.0 - 35.9 g/dL   RDW 13.4 11.1 - 15.7 %   Platelets 233 145 - 400 10e3/uL   NEUT# 2.6 1.5 - 6.5 10e3/uL   LYMPH# 1.3 0.9 - 3.3 10e3/uL   MONO# 0.3 0.1 - 0.9 10e3/uL   Eosinophils Absolute 0.3 0.0 - 0.5 10e3/uL   BASO# 0.0 0.0 - 0.2 10e3/uL   NEUT% 57.4 40.0 - 80.0 %   LYMPH% 29.6 14.0 - 48.0 %   MONO% 6.3 0.0 -  13.0 %   EOS% 6.5 0.0 - 7.0 %   BASO% 0.2 0.0 -  2.0 %  TECHNOLOGIST REVIEW [facility]     Status: None   Collection Time: 03/05/16  8:41 AM  Result Value Ref Range   Tech Review Occ meta   CMP STAT ([facility] only)     Status: Abnormal   Collection Time: 03/05/16  8:41 AM  Result Value Ref Range   Sodium 131 128 - 145 mEq/L   Potassium 4.0 3.3 - 4.7 mEq/L   Chloride 106 98 - 108 mEq/L   CO2 20 18 - 33 mEq/L   Glucose, Bld 118 73 - 118 mg/dL   BUN, Bld 13 7 - 22 mg/dL   Creat 0.8 0.6 - 1.2 mg/dl   Total Bilirubin 1.00 0.20 - 1.60 mg/dl   Alkaline Phosphatase 90 (H) 26 - 84 U/L   AST 32 11 - 38 U/L   ALT(SGPT) 37 10 - 47 U/L   Total Protein 10.2 (H) 6.4 - 8.1 g/dL   Albumin 3.9 3.3 - 5.5 g/dL   Calcium 8.7 8.0 - 10.3 mg/dL  Surgical pcr screen     Status: None   Collection Time: 03/12/16  8:47 AM  Result Value Ref Range   MRSA, PCR NEGATIVE NEGATIVE   Staphylococcus aureus NEGATIVE NEGATIVE    Comment:        The Xpert SA Assay (FDA approved for NASAL specimens in patients over 47 years of age), is one component of a comprehensive surveillance program.  Test performance has been validated by [facility] for patients greater than or equal to 5 year old. It is not intended to diagnose infection nor to guide or monitor treatment.   Basic metabolic panel     Status: Abnormal   Collection Time: 03/12/16 10:00 AM  Result Value Ref Range   Sodium 130 (L) 135 - 145 mmol/L   Potassium 4.4 3.5 - 5.1 mmol/L   Chloride 102 101 - 111 mmol/L   CO2 20 (L) 22 - 32 mmol/L   Glucose, Bld 99 65 - 99 mg/dL   BUN 14 6 - 20 mg/dL   Creatinine, Ser 0.74 0.61 - 1.24 mg/dL   Calcium 9.3 8.9 - 10.3 mg/dL   GFR calc non Af Amer >60 >60 mL/min   GFR calc Af Amer >60 >60 mL/min    Comment: (NOTE) The eGFR has been calculated using the CKD EPI equation. This calculation has not been validated in all clinical situations. eGFR's persistently <60 mL/min signify possible  Chronic Kidney Disease.    Anion gap 8 5 - 15  CBC with Differential/Platelet     Status: Abnormal   Collection Time: 03/12/16 10:00 AM  Result Value Ref Range   WBC 6.5 4.0 - 10.5 K/uL   RBC 3.83 (L) 4.22 - 5.81 MIL/uL   Hemoglobin 13.0 13.0 - 17.0 g/dL   HCT 38.4 (L) 39.0 - 52.0 %   MCV 100.3 (H) 78.0 - 100.0 fL   MCH 33.9 26.0 - 34.0 pg   MCHC 33.9 30.0 - 36.0 g/dL   RDW 13.9 11.5 - 15.5 %   Platelets 188 150 - 400 K/uL   Neutrophils Relative % 67 %   Lymphocytes Relative 23 %   Monocytes Relative 7 %   Eosinophils Relative 3 %   Basophils Relative 0 %   Neutro Abs 4.3 1.7 - 7.7 K/uL   Lymphs Abs 1.5 0.7 - 4.0 K/uL   Monocytes Absolute 0.5 0.1 - 1.0 K/uL   Eosinophils Absolute 0.2 0.0 - 0.7 K/uL  Basophils Absolute 0.0 0.0 - 0.1 K/uL  Protime-INR     Status: None   Collection Time: 03/12/16 10:00 AM  Result Value Ref Range   Prothrombin Time 14.6 11.4 - 15.2 seconds   INR 1.14   APTT     Status: None   Collection Time: 03/12/16 10:00 AM  Result Value Ref Range   aPTT 26 24 - 36 seconds  CBC w/Diff     Status: Abnormal   Collection Time: 03/21/16 10:53 AM  Result Value Ref Range   WBC 6.5 4.0 - 10.0 10e3/uL   RBC 3.88 (L) 4.20 - 5.70 10e6/uL   HGB 13.4 13.0 - 17.1 g/dL   HCT 38.5 (L) 38.7 - 49.9 %   MCV 99 (H) 82 - 98 fL   MCH 34.5 (H) 28.0 - 33.4 pg   MCHC 34.8 32.0 - 35.9 g/dL   RDW 13.6 11.1 - 15.7 %   Platelets 222 145 - 400 10e3/uL   NEUT# 4.0 1.5 - 6.5 10e3/uL   LYMPH# 1.3 0.9 - 3.3 10e3/uL   MONO# 0.8 0.1 - 0.9 10e3/uL   Eosinophils Absolute 0.3 0.0 - 0.5 10e3/uL   BASO# 0.0 0.0 - 0.2 10e3/uL   NEUT% 62.3 40.0 - 80.0 %   LYMPH% 20.7 14.0 - 48.0 %   MONO% 11.6 0.0 - 13.0 %   EOS% 5.1 0.0 - 7.0 %   BASO% 0.3 0.0 - 2.0 %  CMP STAT     Status: Abnormal   Collection Time: 03/21/16 10:53 AM  Result Value Ref Range   Sodium 134 128 - 145 mEq/L   Potassium 3.6 3.3 - 4.7 mEq/L   Chloride 103 98 - 108 mEq/L   CO2 25 18 - 33 mEq/L   Glucose, Bld 120 (H)  73 - 118 mg/dL   BUN, Bld 16 7 - 22 mg/dL   Creat 0.8 0.6 - 1.2 mg/dl   Total Bilirubin 0.90 0.20 - 1.60 mg/dl   Alkaline Phosphatase 83 26 - 84 U/L   AST 25 11 - 38 U/L   ALT(SGPT) 42 10 - 47 U/L   Total Protein 8.4 (H) 6.4 - 8.1 g/dL   Albumin 3.6 3.3 - 5.5 g/dL   Calcium 9.1 8.0 - 10.3 mg/dL  Cardiolipin antibodies, IgG, IgM, IgA     Status: None   Collection Time: 03/21/16  1:28 PM  Result Value Ref Range   Anticardiolipin Ab,IgG,Qn <9 0 - 14 GPL U/mL    Comment:                                          Negative:              <15                                          Indeterminate:     15 - 20                                          Low-Med Positive: >20 - 80  High Positive:         >80    Anticardiolipin Ab,IgM,Qn <9 0 - 12 MPL U/mL    Comment:                                          Negative:              <13                                          Indeterminate:     13 - 20                                          Low-Med Positive: >20 - 80                                          High Positive:         >80    Anticardiolipin Ab,IgA,Qn <9 0 - 11 APL U/mL    Comment:                                          Negative:              <12                                          Indeterminate:     12 - 20                                          Low-Med Positive: >20 - 80                                          High Positive:         >80   Antithrombin III     Status: Abnormal   Collection Time: 03/21/16  1:28 PM  Result Value Ref Range   Antithrombin Activity 137 (H) 75 - 135 %    Comment: An elevated antithrombin activity is of no known clinical significance. Direct thrombin inhibitor anticoagulants such as rivaroxaban, apixaban and edoxaban will lead to spuriously elevated antithrombin activity levels possibly masking a deficiency.   Beta-2-glycoprotein i abs, IgG/M/A     Status: None   Collection Time: 03/21/16  1:28 PM    Result Value Ref Range   Beta-2 Glycoprotein I Ab, IgG <9 0 - 20 GPI IgG units    Comment: The reference interval reflects a 3SD or 99th percentile interval, which is thought to represent a potentially clinically significant result in accordance with the International Consensus Statement on the classification criteria for definitive antiphospholipid syndrome (APS). J Thromb Haem  2006;4:295-306.    Beta-2 Glyco 1 IgA <9 0 - 25 GPI IgA units    Comment: The reference interval reflects a 3SD or 99th percentile interval, which is thought to represent a potentially clinically significant result in accordance with the International Consensus Statement on the classification criteria for definitive antiphospholipid syndrome (APS). J Thromb Haem 2006;4:295-306.    Beta-2 Glyco 1 IgM <9 0 - 32 GPI IgM units    Comment: The reference interval reflects a 3SD or 99th percentile interval, which is thought to represent a potentially clinically significant result in accordance with the International Consensus Statement on the classification criteria for definitive antiphospholipid syndrome (APS). J Thromb Haem 2006;4:295-306.   Prothrombin gene mutation     Status: None   Collection Time: 03/21/16  1:28 PM  Result Value Ref Range   Factor II, DNA Analysis Comment     Comment: NEGATIVE No mutation identified. Comment: A point mutation (G20210A) in the factor II (prothrombin) gene is the second most common cause of inherited thrombophilia. The incidence of this mutation in the [place] Caucasian population is about 2% and in the [place] American population it is approximately 0.5%. This mutation is rare in the [place] and Native American population. Being heterozygous for a prothrombin mutation increases the risk for developing venous thrombosis about 2 to 3 times above the general population risk. Being homozygous for the prothrombin gene mutation increases the relative risk for venous thrombosis  further, although it is not yet known how much further the risk is increased. In women heterozygous for the prothrombin gene mutation, the use of estrogen containing oral contraceptives increases the relative risk of venous thrombosis about 16 times and the risk of developing cerebral thrombosis is also significantly increased. In pregnancy the prothrombin  gene mutation increases risk for venous thrombosis and may increase risk for stillbirth, placental abruption, pre-eclampsia and fetal growth restriction. If the patient possesses two or more congenital or acquired thrombophilic risk factors, the risk for thrombosis may rise to more than the sum of the risk ratios for the individual mutations. This assay detects only the prothrombin G20210A mutation and does not measure genetic abnormalities elsewhere in the genome. Other thrombotic risk factors may be pursued through systematic clinical laboratory analysis. These factors include the R506Q (Leiden) mutation in the Factor V gene, plasma homocysteine levels, as well as testing for deficiencies of antithrombin III, protein C and protein S.    Additional Information Comment     Comment: Genetic Counselors are available for health care providers to discuss results at [phone]). Methodology: DNA analysis of the Factor II gene was performed by PCR amplification followed by restriction analysis. The diagnostic sensitivity is >99% for both. All the tests must be combined with clinical information for the most accurate interpretation. Molecular-based testing is highly accurate, but as in any laboratory test, diagnostic errors may occur. This test was developed and its performance characteristics determined by [facility]. It has not been cleared or approved by the Food and Drug Administration. Poort SR, et al. Blood. 1996; 11:0211-1735. Varga EA. Circulation. 2004; 670:L41-C30. Mervin Hack, et Tuckerman; 19:700-703. Allison Quarry, PhD, [facility] Ruben Reason, PhD, [facility] Jens Som, PhD, [facility] Annetta Maw, M.S., PhD, [facility] Alfredo Bach, PhD, [facility] Norva Riffle, PhD, [facility] Earlean Polka, PhD, [facility] MG   Factor 5 leiden     Status: None   Collection Time: 03/21/16  1:28 PM  Result Value Ref Range  Factor V Leiden Comment     Comment: Result:  Negative (no mutation found) Factor V Leiden is a specific mutation (R506Q) in the factor V gene that is associated with an increased risk of venous thrombosis. Factor V Leiden is more resistant to inactivation by activated protein C.  As a result, factor V persists in the circulation leading to a mild hyper- coagulable state.  The Leiden mutation accounts for 90% - 95% of APC resistance.  Factor V Leiden has been reported in patients with deep vein thrombosis, pulmonary embolus, central retinal vein occlusion, cerebral sinus thrombosis and hepatic vein thrombosis. Other risk factors to be considered in the workup for venous thrombosis include the G20210A mutation in the factor II (prothrombin) gene, protein S and C deficiency, and antithrombin deficiencies. Anticardiolipin antibody and lupus anticoagulant analysis may be appropriate for certain patients, as well as homocysteine levels. Contact your local [facility] for information on how to order additional tes ting if desired.    Comment Comment     Comment: [employer] counselors are available for health care providers to**   discuss results at [phone]). Methodology: DNA analysis of the Factor V gene was performed by allele-specific PCR. The diagnostic sensitivity and specificity is >99% for both. Molecular-based testing is highly accurate, but as in any laboratory test, diagnostic errors may occur. All test results must be combined with clinical information for the most accurate interpretation. This test was developed and its performance  characteristics determined by [facility]. It has not been cleared or approved by the Food and Drug Administration. References: Voelkerding K (1996).  Clin Lab Med [phone]. Allison Quarry, PhD, [facility] Ruben Reason, PhD, [facility] Jens Som, PhD, [facility] Annetta Maw, M.S., PhD, [facility] Alfredo Bach, PhD, [facility] Norva Riffle, PhD, [facility] Earlean Polka PhD, [facility]   Lupus anticoagulant panel     Status: None   Collection Time: 03/21/16  1:28 PM  Result Value Ref Range   PTT-LA 31.2 0.0 - 51.9 sec   dRVVT 39.2 0.0 - 47.0 sec   Lupus Reflex Interpretation Comment:     Comment: No lupus anticoagulant was detected.  Protein C, total     Status: None   Collection Time: 03/21/16  1:28 PM  Result Value Ref Range   Protein C Antigen 98 60 - 150 %  Protein C activity     Status: None   Collection Time: 03/21/16  1:28 PM  Result Value Ref Range   Protein C-Functional 135 73 - 180 %  Protein S activity     Status: None   Collection Time: 03/21/16  1:28 PM  Result Value Ref Range   Protein S-Functional 135 63 - 140 %    Comment: Protein S activity may be falsely increased (masking an abnormal, low result) in patients receiving direct Xa inhibitor (e.g., rivaroxaban, apixaban, edoxaban) or a direct thrombin inhibitor (e.g., dabigatran) anticoagulant treatment due to assay interference by these drugs.   Protein S, total     Status: None   Collection Time: 03/21/16  1:28 PM  Result Value Ref Range   Protein S, Total 118 60 - 150 %    Comment: This test was developed and its performance characteristics determined by [facility]. It has not been cleared or approved by the Food and Drug Administration.   CBC with Differential [facility])     Status: Abnormal   Collection Time: 03/26/16 11:24 AM  Result Value Ref Range   WBC 5.4 4.0 - 10.0  10e3/uL   RBC 4.06 (L) 4.20 - 5.70 10e6/uL   HGB 13.6 13.0 - 17.1 g/dL   HCT 39.5 38.7 - 49.9 %   MCV 97 82 - 98 fL   MCH 33.5 (H) 28.0  - 33.4 pg   MCHC 34.4 32.0 - 35.9 g/dL   RDW 12.9 11.1 - 15.7 %   Platelets 331 145 - 400 10e3/uL   NEUT# 2.7 1.5 - 6.5 10e3/uL   LYMPH# 1.8 0.9 - 3.3 10e3/uL   MONO# 0.7 0.1 - 0.9 10e3/uL   Eosinophils Absolute 0.1 0.0 - 0.5 10e3/uL   BASO# 0.0 0.0 - 0.2 10e3/uL   NEUT% 49.7 40.0 - 80.0 %   LYMPH% 33.9 14.0 - 48.0 %   MONO% 13.4 (H) 0.0 - 13.0 %   EOS% 2.4 0.0 - 7.0 %   BASO% 0.6 0.0 - 2.0 %  TECHNOLOGIST REVIEW [facility]     Status: None   Collection Time: 03/26/16 11:24 AM  Result Value Ref Range   Tech Review 3% myelocytes present   CMP STAT ([place] only)     Status: Abnormal   Collection Time: 03/26/16 11:24 AM  Result Value Ref Range   Sodium 130 128 - 145 mEq/L   Potassium 4.0 3.3 - 4.7 mEq/L   Chloride 104 98 - 108 mEq/L   CO2 22 18 - 33 mEq/L   Glucose, Bld 110 73 - 118 mg/dL   BUN, Bld 18 7 - 22 mg/dL   Creat 1.0 0.6 - 1.2 mg/dl   Total Bilirubin 0.70 0.20 - 1.60 mg/dl   Alkaline Phosphatase 98 (H) 26 - 84 U/L   AST 30 11 - 38 U/L   ALT(SGPT) 36 10 - 47 U/L   Total Protein 8.3 (H) 6.4 - 8.1 g/dL   Albumin 3.6 3.3 - 5.5 g/dL   Calcium 8.8 8.0 - 10.3 mg/dL  Kappa/lambda light chains     Status: Abnormal   Collection Time: 03/26/16  1:05 PM  Result Value Ref Range   Ig Kappa Free Light Chain 21.0 (H) 3.3 - 19.4 mg/L   Ig Lambda Free Light Chain 5.9 5.7 - 26.3 mg/L   Kappa/Lambda FluidC Ratio 3.56 (H) 0.26 - 1.65  Beta 2 microglobulin, serum     Status: None   Collection Time: 03/26/16  1:05 PM  Result Value Ref Range   Beta-2 2.3 0.6 - 2.4 mg/L  Multiple Myeloma Panel (SPEP&IFE w/QIG)     Status: Abnormal   Collection Time: 03/26/16  1:05 PM  Result Value Ref Range   IgG, Qn, Serum 2,174 (H) 700 - 1600 mg/dL   IgA, Qn, Serum 25 (L) 90 - 386 mg/dL    Comment: Result confirmed on concentration.   IgM, Qn, Serum 31 20 - 172 mg/dL   Total Protein 7.6 6.0 - 8.5 g/dL   Albumin SerPl Elph-Mcnc 3.5 2.9 - 4.4 g/dL   Alpha 1 0.3 0.0 - 0.4 g/dL    Alpha2 Glob SerPl Elph-Mcnc 0.9 0.4 - 1.0 g/dL   B-Globulin SerPl Elph-Mcnc 0.9 0.7 - 1.3 g/dL   Gamma Glob SerPl Elph-Mcnc 1.9 (H) 0.4 - 1.8 g/dL   M Protein SerPl Elph-Mcnc 1.7 (H) Not Observed g/dL   Globulin, Total 4.1 (H) 2.2 - 3.9 g/dL   Albumin/Glob SerPl 0.9 0.7 - 1.7   IFE 1 Comment     Comment: Immunofixation shows IgG monoclonal protein with kappa light chain specificity.    Please Note Comment  Comment: Protein electrophoresis scan will follow via computer, mail, or courier delivery.   Lactate dehydrogenase     Status: None   Collection Time: 03/26/16  1:06 PM  Result Value Ref Range   LDH 230 125 - 245 U/L  CMP STAT ([facility] only)     Status: Abnormal   Collection Time: 04/02/16  8:36 AM  Result Value Ref Range   Sodium 135 128 - 145 mEq/L   Potassium 4.4 3.3 - 4.7 mEq/L   Chloride 107 98 - 108 mEq/L   CO2 25 18 - 33 mEq/L   Glucose, Bld 95 73 - 118 mg/dL   BUN, Bld 13 7 - 22 mg/dL   Creat 0.8 0.6 - 1.2 mg/dl   Total Bilirubin 0.80 0.20 - 1.60 mg/dl   Alkaline Phosphatase 105 (H) 26 - 84 U/L   AST 37 11 - 38 U/L   ALT(SGPT) 48 (H) 10 - 47 U/L   Total Protein 8.3 (H) 6.4 - 8.1 g/dL   Albumin 4.1 3.3 - 5.5 g/dL   Calcium 8.8 8.0 - 10.3 mg/dL  CBC with Differential [facility])     Status: Abnormal   Collection Time: 04/02/16  8:36 AM  Result Value Ref Range   WBC 5.1 4.0 - 10.0 10e3/uL   RBC 4.12 (L) 4.20 - 5.70 10e6/uL   HGB 13.8 13.0 - 17.1 g/dL   HCT 40.4 38.7 - 49.9 %   MCV 98 82 - 98 fL   MCH 33.5 (H) 28.0 - 33.4 pg   MCHC 34.2 32.0 - 35.9 g/dL   RDW 13.5 11.1 - 15.7 %   Platelets 227 145 - 400 10e3/uL   NEUT# 2.9 1.5 - 6.5 10e3/uL   LYMPH# 1.4 0.9 - 3.3 10e3/uL   MONO# 0.5 0.1 - 0.9 10e3/uL   Eosinophils Absolute 0.3 0.0 - 0.5 10e3/uL   BASO# 0.0 0.0 - 0.2 10e3/uL   NEUT% 57.1 40.0 - 80.0 %   LYMPH% 27.2 14.0 - 48.0 %   MONO% 9.8 0.0 - 13.0 %   EOS% 5.1 0.0 - 7.0 %   BASO% 0.8 0.0 - 2.0 %  TECHNOLOGIST REVIEW [facility]     Status: None   Collection Time: 04/02/16  8:36 AM  Result Value Ref Range   Tech Review Metas and Myelocytes present   CMP STAT ([place] only)     Status: Abnormal   Collection Time: 04/09/16  8:53 AM  Result Value Ref Range   Sodium 132 128 - 145 mEq/L   Potassium 4.2 3.3 - 4.7 mEq/L   Chloride 101 98 - 108 mEq/L   CO2 26 18 - 33 mEq/L   Glucose, Bld 131 (H) 73 - 118 mg/dL   BUN, Bld 12 7 - 22 mg/dL   Creat 1.0 0.6 - 1.2 mg/dl   Total Bilirubin 1.00 0.20 - 1.60 mg/dl   Alkaline Phosphatase 96 (H) 26 - 84 U/L   AST 27 11 - 38 U/L   ALT(SGPT) 38 10 - 47 U/L   Total Protein 8.1 6.4 - 8.1 g/dL   Albumin 4.0 3.3 - 5.5 g/dL   Calcium 9.0 8.0 - 10.3 mg/dL  CBC with Differential [facility])     Status: Abnormal   Collection Time: 04/09/16  8:53 AM  Result Value Ref Range   WBC 6.8 4.0 - 10.0 10e3/uL   RBC 4.30 4.20 - 5.70 10e6/uL   HGB 14.4 13.0 - 17.1 g/dL  HCT 42.2 38.7 - 49.9 %   MCV 98 82 - 98 fL   MCH 33.5 (H) 28.0 - 33.4 pg   MCHC 34.1 32.0 - 35.9 g/dL   RDW 13.5 11.1 - 15.7 %   Platelets 194 145 - 400 10e3/uL   NEUT# 4.5 1.5 - 6.5 10e3/uL   LYMPH# 1.5 0.9 - 3.3 10e3/uL   MONO# 0.6 0.1 - 0.9 10e3/uL   Eosinophils Absolute 0.2 0.0 - 0.5 10e3/uL   BASO# 0.0 0.0 - 0.2 10e3/uL   NEUT% 66.1 40.0 - 80.0 %   LYMPH% 21.8 14.0 - 48.0 %   MONO% 8.6 0.0 - 13.0 %   EOS% 3.2 0.0 - 7.0 %   BASO% 0.3 0.0 - 2.0 %    Assessment/Plan: 1. Insomnia secondary to chronic pain Tolerating Ambien with good results. Medication refilled. - zolpidem (AMBIEN) 10 MG tablet; Take 1 tablet (10 mg total) by mouth at bedtime as needed.  Dispense: 30 tablet; Refill: 3  2. Encounter for immunization Flu shot updated.  - Flu Vaccine QUAD 36+ mos IM   Leeanne Rio, PA-C

## 2016-04-09 NOTE — Patient Instructions (Signed)
Bortezomib injection What is this medicine? BORTEZOMIB (bor TEZ oh mib) is a medicine that targets proteins in cancer cells and stops the cancer cells from growing. It is used to treat multiple myeloma and mantle-cell lymphoma. This medicine may be used for other purposes; ask your health care provider or pharmacist if you have questions. What should I tell my health care provider before I take this medicine? They need to know if you have any of these conditions: -diabetes -heart disease -irregular heartbeat -liver disease -on hemodialysis -low blood counts, like low white blood cells, platelets, or hemoglobin -peripheral neuropathy -taking medicine for blood pressure -an unusual or allergic reaction to bortezomib, mannitol, boron, other medicines, foods, dyes, or preservatives -pregnant or trying to get pregnant -breast-feeding How should I use this medicine? This medicine is for injection into a vein or for injection under the skin. It is given by a health care professional in a hospital or clinic setting. Talk to your pediatrician regarding the use of this medicine in children. Special care may be needed. Overdosage: If you think you have taken too much of this medicine contact a poison control center or emergency room at once. NOTE: This medicine is only for you. Do not share this medicine with others. What if I miss a dose? It is important not to miss your dose. Call your doctor or health care professional if you are unable to keep an appointment. What may interact with this medicine? This medicine may interact with the following medications: -ketoconazole -rifampin -ritonavir -St. John's Wort This list may not describe all possible interactions. Give your health care provider a list of all the medicines, herbs, non-prescription drugs, or dietary supplements you use. Also tell them if you smoke, drink alcohol, or use illegal drugs. Some items may interact with your medicine. What  should I watch for while using this medicine? Visit your doctor for checks on your progress. This drug may make you feel generally unwell. This is not uncommon, as chemotherapy can affect healthy cells as well as cancer cells. Report any side effects. Continue your course of treatment even though you feel ill unless your doctor tells you to stop. You may get drowsy or dizzy. Do not drive, use machinery, or do anything that needs mental alertness until you know how this medicine affects you. Do not stand or sit up quickly, especially if you are an older patient. This reduces the risk of dizzy or fainting spells. In some cases, you may be given additional medicines to help with side effects. Follow all directions for their use. Call your doctor or health care professional for advice if you get a fever, chills or sore throat, or other symptoms of a cold or flu. Do not treat yourself. This drug decreases your body's ability to fight infections. Try to avoid being around people who are sick. This medicine may increase your risk to bruise or bleed. Call your doctor or health care professional if you notice any unusual bleeding. You may need blood work done while you are taking this medicine. In some patients, this medicine may cause a serious brain infection that may cause death. If you have any problems seeing, thinking, speaking, walking, or standing, tell your doctor right away. If you cannot reach your doctor, urgently seek other source of medical care. Do not become pregnant while taking this medicine. Women should inform their doctor if they wish to become pregnant or think they might be pregnant. There is a potential for serious  side effects to an unborn child. Talk to your health care professional or pharmacist for more information. Do not breast-feed an infant while taking this medicine. Check with your doctor or health care professional if you get an attack of severe diarrhea, nausea and vomiting, or if  you sweat a lot. The loss of too much body fluid can make it dangerous for you to take this medicine. What side effects may I notice from receiving this medicine? Side effects that you should report to your doctor or health care professional as soon as possible: -allergic reactions like skin rash, itching or hives, swelling of the face, lips, or tongue -breathing problems -changes in hearing -changes in vision -fast, irregular heartbeat -feeling faint or lightheaded, falls -pain, tingling, numbness in the hands or feet -right upper belly pain -seizures -swelling of the ankles, feet, hands -unusual bleeding or bruising -unusually weak or tired -vomiting -yellowing of the eyes or skin Side effects that usually do not require medical attention (report to your doctor or health care professional if they continue or are bothersome): -changes in emotions or moods -constipation -diarrhea -loss of appetite -headache -irritation at site where injected -nausea This list may not describe all possible side effects. Call your doctor for medical advice about side effects. You may report side effects to FDA at 1-800-FDA-1088. Where should I keep my medicine? This drug is given in a hospital or clinic and will not be stored at home. NOTE: This sheet is a summary. It may not cover all possible information. If you have questions about this medicine, talk to your doctor, pharmacist, or health care provider.    2016, Elsevier/Gold Standard. (2014-08-29 14:47:04)

## 2016-04-09 NOTE — Progress Notes (Signed)
Pre visit review using our clinic review tool, if applicable. No additional management support is needed unless otherwise documented below in the visit note/SLS  

## 2016-04-09 NOTE — Telephone Encounter (Signed)
Pt's wife dropped off document of copy of pt's records from [place] for PCP to see and have on chart. (Documents put at front office tray)

## 2016-04-09 NOTE — Patient Instructions (Signed)
Please continue medications as directed. I am continuing the Ambien nightly to help with sleep.  Please continue infusions with Dr. Marin Olp and follow-up as scheduled.  Please schedule an appointment for a complete physical at your earliest convenience so we can assess cholesterol, prostate cancer screening, etc.   It was a pleasure to meet you.

## 2016-04-11 DIAGNOSIS — Z23 Encounter for immunization: Secondary | ICD-10-CM

## 2016-04-16 ENCOUNTER — Other Ambulatory Visit: Payer: Self-pay | Admitting: *Deleted

## 2016-04-16 DIAGNOSIS — D472 Monoclonal gammopathy: Secondary | ICD-10-CM

## 2016-04-16 DIAGNOSIS — C9000 Multiple myeloma not having achieved remission: Secondary | ICD-10-CM

## 2016-04-16 MED ORDER — LENALIDOMIDE 25 MG PO CAPS: ORAL_CAPSULE | ORAL | 6 refills | Status: DC

## 2016-04-23 ENCOUNTER — Other Ambulatory Visit (HOSPITAL_BASED_OUTPATIENT_CLINIC_OR_DEPARTMENT_OTHER): Payer: 59

## 2016-04-23 ENCOUNTER — Ambulatory Visit (HOSPITAL_BASED_OUTPATIENT_CLINIC_OR_DEPARTMENT_OTHER): Payer: 59 | Admitting: Family

## 2016-04-23 ENCOUNTER — Ambulatory Visit (HOSPITAL_BASED_OUTPATIENT_CLINIC_OR_DEPARTMENT_OTHER): Payer: 59

## 2016-04-23 ENCOUNTER — Encounter: Payer: Self-pay | Admitting: Family

## 2016-04-23 ENCOUNTER — Other Ambulatory Visit: Payer: Self-pay | Admitting: Family

## 2016-04-23 VITALS — BP 114/73 | HR 68 | Temp 98.0°F | Resp 18 | Ht 70.0 in | Wt 173.0 lb

## 2016-04-23 DIAGNOSIS — C9000 Multiple myeloma not having achieved remission: Secondary | ICD-10-CM

## 2016-04-23 DIAGNOSIS — G8929 Other chronic pain: Secondary | ICD-10-CM

## 2016-04-23 DIAGNOSIS — M545 Low back pain: Secondary | ICD-10-CM

## 2016-04-23 DIAGNOSIS — I82413 Acute embolism and thrombosis of femoral vein, bilateral: Secondary | ICD-10-CM

## 2016-04-23 DIAGNOSIS — Z5112 Encounter for antineoplastic immunotherapy: Secondary | ICD-10-CM | POA: Diagnosis not present

## 2016-04-23 DIAGNOSIS — D472 Monoclonal gammopathy: Secondary | ICD-10-CM

## 2016-04-23 LAB — CMP (CANCER CENTER ONLY)
ALBUMIN: 3.8 g/dL (ref 3.3–5.5)
ALT(SGPT): 41 U/L (ref 10–47)
AST: 30 U/L (ref 11–38)
Alkaline Phosphatase: 63 U/L (ref 26–84)
BUN, Bld: 15 mg/dL (ref 7–22)
CALCIUM: 8.5 mg/dL (ref 8.0–10.3)
CHLORIDE: 98 meq/L (ref 98–108)
CO2: 23 meq/L (ref 18–33)
Creat: 0.8 mg/dl (ref 0.6–1.2)
GLUCOSE: 139 mg/dL — AB (ref 73–118)
POTASSIUM: 4.1 meq/L (ref 3.3–4.7)
Sodium: 132 mEq/L (ref 128–145)
Total Bilirubin: 0.6 mg/dl (ref 0.20–1.60)
Total Protein: 6.8 g/dL (ref 6.4–8.1)

## 2016-04-23 LAB — CBC WITH DIFFERENTIAL (CANCER CENTER ONLY)
BASO#: 0 10*3/uL (ref 0.0–0.2)
BASO%: 0.6 % (ref 0.0–2.0)
EOS ABS: 0.2 10*3/uL (ref 0.0–0.5)
EOS%: 5.5 % (ref 0.0–7.0)
HEMATOCRIT: 40.4 % (ref 38.7–49.9)
HEMOGLOBIN: 13.7 g/dL (ref 13.0–17.1)
LYMPH#: 1.3 10*3/uL (ref 0.9–3.3)
LYMPH%: 37.4 % (ref 14.0–48.0)
MCH: 32.9 pg (ref 28.0–33.4)
MCHC: 33.9 g/dL (ref 32.0–35.9)
MCV: 97 fL (ref 82–98)
MONO#: 0.4 10*3/uL (ref 0.1–0.9)
MONO%: 12.6 % (ref 0.0–13.0)
NEUT%: 43.9 % (ref 40.0–80.0)
NEUTROS ABS: 1.5 10*3/uL (ref 1.5–6.5)
Platelets: 229 10*3/uL (ref 145–400)
RBC: 4.16 10*6/uL — ABNORMAL LOW (ref 4.20–5.70)
RDW: 13.7 % (ref 11.1–15.7)
WBC: 3.5 10*3/uL — ABNORMAL LOW (ref 4.0–10.0)

## 2016-04-23 LAB — LACTATE DEHYDROGENASE: LDH: 159 U/L (ref 125–245)

## 2016-04-23 MED ORDER — ZOLEDRONIC ACID 4 MG/100ML IV SOLN
4.0000 mg | Freq: Once | INTRAVENOUS | Status: AC
  Administered 2016-04-23: 4 mg via INTRAVENOUS
  Filled 2016-04-23: qty 100

## 2016-04-23 MED ORDER — PROCHLORPERAZINE MALEATE 10 MG PO TABS
10.0000 mg | ORAL_TABLET | Freq: Once | ORAL | Status: AC
  Administered 2016-04-23: 10 mg via ORAL

## 2016-04-23 MED ORDER — PROCHLORPERAZINE MALEATE 10 MG PO TABS
ORAL_TABLET | ORAL | Status: AC
  Filled 2016-04-23: qty 1

## 2016-04-23 MED ORDER — FENTANYL 50 MCG/HR TD PT72: 50.0000 ug | MEDICATED_PATCH | TRANSDERMAL | 0 refills | Status: DC

## 2016-04-23 MED ORDER — SODIUM CHLORIDE 0.9 % IV SOLN
Freq: Once | INTRAVENOUS | Status: AC
  Administered 2016-04-23: 11:00:00 via INTRAVENOUS

## 2016-04-23 MED ORDER — BORTEZOMIB CHEMO SQ INJECTION 3.5 MG (2.5MG/ML)
1.3000 mg/m2 | Freq: Once | INTRAMUSCULAR | Status: AC
  Administered 2016-04-23: 2.5 mg via SUBCUTANEOUS
  Filled 2016-04-23: qty 2.5

## 2016-04-23 NOTE — Patient Instructions (Signed)
Zoledronic Acid injection (Hypercalcemia, Oncology)  What is this medicine?  ZOLEDRONIC ACID (ZOE le dron ik AS id) lowers the amount of calcium loss from bone. It is used to treat too much calcium in your blood from cancer. It is also used to prevent complications of cancer that has spread to the bone.  This medicine may be used for other purposes; ask your health care provider or pharmacist if you have questions.  What should I tell my health care provider before I take this medicine?  They need to know if you have any of these conditions:  -aspirin-sensitive asthma  -cancer, especially if you are receiving medicines used to treat cancer  -dental disease or wear dentures  -infection  -kidney disease  -receiving corticosteroids like dexamethasone or prednisone  -an unusual or allergic reaction to zoledronic acid, other medicines, foods, dyes, or preservatives  -pregnant or trying to get pregnant  -breast-feeding  How should I use this medicine?  This medicine is for infusion into a vein. It is given by a health care professional in a hospital or clinic setting.  Talk to your pediatrician regarding the use of this medicine in children. Special care may be needed.  Overdosage: If you think you have taken too much of this medicine contact a poison control center or emergency room at once.  NOTE: This medicine is only for you. Do not share this medicine with others.  What if I miss a dose?  It is important not to miss your dose. Call your doctor or health care professional if you are unable to keep an appointment.  What may interact with this medicine?  -certain antibiotics given by injection  -NSAIDs, medicines for pain and inflammation, like ibuprofen or naproxen  -some diuretics like bumetanide, furosemide  -teriparatide  -thalidomide  This list may not describe all possible interactions. Give your health care provider a list of all the medicines, herbs, non-prescription drugs, or dietary supplements you use. Also  tell them if you smoke, drink alcohol, or use illegal drugs. Some items may interact with your medicine.  What should I watch for while using this medicine?  Visit your doctor or health care professional for regular checkups. It may be some time before you see the benefit from this medicine. Do not stop taking your medicine unless your doctor tells you to. Your doctor may order blood tests or other tests to see how you are doing.  Women should inform their doctor if they wish to become pregnant or think they might be pregnant. There is a potential for serious side effects to an unborn child. Talk to your health care professional or pharmacist for more information.  You should make sure that you get enough calcium and vitamin D while you are taking this medicine. Discuss the foods you eat and the vitamins you take with your health care professional.  Some people who take this medicine have severe bone, joint, and/or muscle pain. This medicine may also increase your risk for jaw problems or a broken thigh bone. Tell your doctor right away if you have severe pain in your jaw, bones, joints, or muscles. Tell your doctor if you have any pain that does not go away or that gets worse.  Tell your dentist and dental surgeon that you are taking this medicine. You should not have major dental surgery while on this medicine. See your dentist to have a dental exam and fix any dental problems before starting this medicine. Take good care   of your teeth while on this medicine. Make sure you see your dentist for regular follow-up appointments.  What side effects may I notice from receiving this medicine?  Side effects that you should report to your doctor or health care professional as soon as possible:  -allergic reactions like skin rash, itching or hives, swelling of the face, lips, or tongue  -anxiety, confusion, or depression  -breathing problems  -changes in vision  -eye pain  -feeling faint or lightheaded, falls  -jaw pain,  especially after dental work  -mouth sores  -muscle cramps, stiffness, or weakness  -redness, blistering, peeling or loosening of the skin, including inside the mouth  -trouble passing urine or change in the amount of urine  Side effects that usually do not require medical attention (report to your doctor or health care professional if they continue or are bothersome):  -bone, joint, or muscle pain  -constipation  -diarrhea  -fever  -hair loss  -irritation at site where injected  -loss of appetite  -nausea, vomiting  -stomach upset  -trouble sleeping  -trouble swallowing  -weak or tired  This list may not describe all possible side effects. Call your doctor for medical advice about side effects. You may report side effects to FDA at 1-800-FDA-1088.  Where should I keep my medicine?  This drug is given in a hospital or clinic and will not be stored at home.  NOTE: This sheet is a summary. It may not cover all possible information. If you have questions about this medicine, talk to your doctor, pharmacist, or health care provider.      2016, [employer]vier/[employer] Standard. (2013-11-26 14:19:39)  Bortezomib injection  What is this medicine?  BORTEZOMIB (bor TEZ oh mib) is a medicine that targets proteins in cancer cells and stops the cancer cells from growing. It is used to treat multiple myeloma and mantle-cell lymphoma.  This medicine may be used for other purposes; ask your health care provider or pharmacist if you have questions.  What should I tell my health care provider before I take this medicine?  They need to know if you have any of these conditions:  -diabetes  -heart disease  -irregular heartbeat  -liver disease  -on hemodialysis  -low blood counts, like low white blood cells, platelets, or hemoglobin  -peripheral neuropathy  -taking medicine for blood pressure  -an unusual or allergic reaction to bortezomib, mannitol, boron, other medicines, foods, dyes, or preservatives  -pregnant or trying to get  pregnant  -breast-feeding  How should I use this medicine?  This medicine is for injection into a vein or for injection under the skin. It is given by a health care professional in a hospital or clinic setting.  Talk to your pediatrician regarding the use of this medicine in children. Special care may be needed.  Overdosage: If you think you have taken too much of this medicine contact a poison control center or emergency room at once.  NOTE: This medicine is only for you. Do not share this medicine with others.  What if I miss a dose?  It is important not to miss your dose. Call your doctor or health care professional if you are unable to keep an appointment.  What may interact with this medicine?  This medicine may interact with the following medications:  -ketoconazole  -rifampin  -ritonavir  -St. John's Wort  This list may not describe all possible interactions. Give your health care provider a list of all the medicines, herbs, non-prescription drugs,   or dietary supplements you use. Also tell them if you smoke, drink alcohol, or use illegal drugs. Some items may interact with your medicine.  What should I watch for while using this medicine?  Visit your doctor for checks on your progress. This drug may make you feel generally unwell. This is not uncommon, as chemotherapy can affect healthy cells as well as cancer cells. Report any side effects. Continue your course of treatment even though you feel ill unless your doctor tells you to stop.  You may get drowsy or dizzy. Do not drive, use machinery, or do anything that needs mental alertness until you know how this medicine affects you. Do not stand or sit up quickly, especially if you are an older patient. This reduces the risk of dizzy or fainting spells.  In some cases, you may be given additional medicines to help with side effects. Follow all directions for their use.  Call your doctor or health care professional for advice if you get a fever, chills or sore  throat, or other symptoms of a cold or flu. Do not treat yourself. This drug decreases your body's ability to fight infections. Try to avoid being around people who are sick.  This medicine may increase your risk to bruise or bleed. Call your doctor or health care professional if you notice any unusual bleeding.  You may need blood work done while you are taking this medicine.  In some patients, this medicine may cause a serious brain infection that may cause death. If you have any problems seeing, thinking, speaking, walking, or standing, tell your doctor right away. If you cannot reach your doctor, urgently seek other source of medical care.  Do not become pregnant while taking this medicine. Women should inform their doctor if they wish to become pregnant or think they might be pregnant. There is a potential for serious side effects to an unborn child. Talk to your health care professional or pharmacist for more information. Do not breast-feed an infant while taking this medicine.  Check with your doctor or health care professional if you get an attack of severe diarrhea, nausea and vomiting, or if you sweat a lot. The loss of too much body fluid can make it dangerous for you to take this medicine.  What side effects may I notice from receiving this medicine?  Side effects that you should report to your doctor or health care professional as soon as possible:  -allergic reactions like skin rash, itching or hives, swelling of the face, lips, or tongue  -breathing problems  -changes in hearing  -changes in vision  -fast, irregular heartbeat  -feeling faint or lightheaded, falls  -pain, tingling, numbness in the hands or feet  -right upper belly pain  -seizures  -swelling of the ankles, feet, hands  -unusual bleeding or bruising  -unusually weak or tired  -vomiting  -yellowing of the eyes or skin  Side effects that usually do not require medical attention (report to your doctor or health care professional if they  continue or are bothersome):  -changes in emotions or moods  -constipation  -diarrhea  -loss of appetite  -headache  -irritation at site where injected  -nausea  This list may not describe all possible side effects. Call your doctor for medical advice about side effects. You may report side effects to FDA at 1-800-FDA-1088.  Where should I keep my medicine?  This drug is given in a hospital or clinic and will not be stored at home.

## 2016-04-23 NOTE — Progress Notes (Signed)
Hematology and Oncology Follow Up Visit  Vernel Langenderfer [id number] 06/07/1956 60 y.o. 04/23/2016   Principle Diagnosis:  IgG Kappa myeloma - Hyperdiploid/+11 DVT of the LEFT and RIGHT leg  Current Therapy:   RVD s/p cycle 6 Zometa 4 mg IV q month     Interim History:  Mr. Mucci is here today with his wife for follow-up and treatment. He is in good spirits today and states that he is feeling a lot better. His appetite has improved but he admits to needing to drink more fluids. His weight is stable.  He has intermittent fatigue.  No fever, chills, n/v, cough, rash, dizziness, headache, SOB, chest pain, palpitations, abdominal pain or changes in bladder habits. He has had some mild diarrhea this week while off of Revlimid. This is tolerable.  He is still having lower back pain since his Kyphoplasty. No swelling, tenderness, weakness, numbness or tingling in his extremities. No new aches or pains.  No falls or syncopal episodes.  He has done well on Xarelto. He has had no episodes of bleeding, bruising or petechiae.  No lymphadenopathy found on exam.   Medications:    Medication List       Accurate as of 04/23/16  9:15 AM. Always use your most recent med list.          acetaminophen 500 MG tablet Commonly known as:  TYLENOL Take 500 mg by mouth every 6 (six) hours as needed.   carisoprodol 350 MG tablet Commonly known as:  SOMA Take 1 tablet (350 mg total) by mouth 4 (four) times daily as needed for muscle spasms.   dexamethasone 4 MG tablet Commonly known as:  DECADRON Take 5 pills at one time with food every week   famciclovir 500 MG tablet Commonly known as:  FAMVIR Take 1 tablet (500 mg total) by mouth daily.   fentaNYL 25 MCG/HR patch Commonly known as:  DURAGESIC - dosed mcg/hr Place 1 patch (25 mcg total) onto the skin every 3 (three) days.   HYDROcodone-acetaminophen 10-325 MG tablet Commonly known as:  NORCO Take 0.5-1 tablets by mouth every 8 (eight)  hours as needed for moderate pain.   ibuprofen 400 MG tablet Commonly known as:  ADVIL,MOTRIN Take 400 mg by mouth every 6 (six) hours as needed for mild pain.   lenalidomide 25 MG capsule Commonly known as:  REVLIMID Take 1 capsule daily for 21 days on rthen 7 days off. Auth # [id number]   lidocaine-prilocaine cream Commonly known as:  EMLA Apply to affected area once   LORazepam 0.5 MG tablet Commonly known as:  ATIVAN Take 1 tablet (0.5 mg total) by mouth every 6 (six) hours as needed (Nausea or vomiting).   magnesium oxide 400 MG tablet Commonly known as:  MAG-OX Take 400 mg by mouth daily.   ondansetron 8 MG tablet Commonly known as:  ZOFRAN Take 1 tablet (8 mg total) by mouth 2 (two) times daily as needed (Nausea or vomiting).   polyethylene glycol powder powder Commonly known as:  MIRALAX Take 17 g by mouth daily.   prochlorperazine 10 MG tablet Commonly known as:  COMPAZINE Take 1 tablet (10 mg total) by mouth every 6 (six) hours as needed for nausea or vomiting.   Rivaroxaban 15 & 20 MG Tbpk Take as directed on package: Start with one 18m tablet by mouth twice a day with food. On Day 22, switch to one 263mtablet once a day with food.   rivaroxaban 20 MG Tabs  tablet Commonly known as:  XARELTO Take 1 tablet (20 mg total) by mouth daily with supper.   sertraline 100 MG tablet Commonly known as:  ZOLOFT Take 100 mg by mouth daily.   VITAMIN D PO Take 5,000 Units by mouth daily.   zolpidem 10 MG tablet Commonly known as:  AMBIEN Take 1 tablet (10 mg total) by mouth at bedtime as needed.       Allergies: No Known Allergies  Past Medical History, Surgical history, Social history, and Family History were reviewed and updated.  Review of Systems: All other 10 point review of systems is negative.   Physical Exam:  vitals were not taken for this visit.  Wt Readings from Last 3 Encounters:  04/09/16 173 lb 2 oz (78.5 kg)  03/26/16 172 lb (78 kg)    03/13/16 175 lb (79.4 kg)    Ocular: Sclerae unicteric, pupils equal, round and reactive to light Ear-nose-throat: Oropharynx clear, dentition fair Lymphatic: No cervical supraclavicular or axillary adenopathy Lungs no rales or rhonchi, good excursion bilaterally Heart regular rate and rhythm, no murmur appreciated Abd soft, nontender, positive bowel sounds, no liver or spleen tip palpated on exam, no fluid wave MSK has some tenderness along he thoracic spine, no joint edema Neuro: non-focal, well-oriented, appropriate affect Breasts: Deferred  Lab Results  Component Value Date   WBC 6.8 04/09/2016   HGB 14.4 04/09/2016   HCT 42.2 04/09/2016   MCV 98 04/09/2016   PLT 194 04/09/2016   No results found for: FERRITIN, IRON, TIBC, UIBC, IRONPCTSAT Lab Results  Component Value Date   RBC 4.30 04/09/2016   Lab Results  Component Value Date   KAPLAMBRATIO 3.56 (H) 03/26/2016   Lab Results  Component Value Date   IGGSERUM 2,174 (H) 03/26/2016   IGMSERUM 31 03/26/2016   Lab Results  Component Value Date   MSPIKE 3.6 (H) 02/08/2016     Chemistry      Component Value Date/Time   NA 132 04/09/2016 0853   NA 136 02/22/2016 0804   K 4.2 04/09/2016 0853   K 3.8 02/22/2016 0804   CL 101 04/09/2016 0853   CO2 26 04/09/2016 0853   CO2 22 02/22/2016 0804   BUN 12 04/09/2016 0853   BUN 16.4 02/22/2016 0804   CREATININE 1.0 04/09/2016 0853   CREATININE 1.0 02/22/2016 0804      Component Value Date/Time   CALCIUM 9.0 04/09/2016 0853   CALCIUM 9.8 02/22/2016 0804   ALKPHOS 96 (H) 04/09/2016 0853   ALKPHOS 95 02/22/2016 0804   AST 27 04/09/2016 0853   AST 19 02/22/2016 0804   ALT 38 04/09/2016 0853   ALT 24 02/22/2016 0804   BILITOT 1.00 04/09/2016 0853   BILITOT 0.41 02/22/2016 0804     Impression and Plan: Mr. Calo is a 60 yo white male with IgG Kappa Myeloma receiving RVD s/p cycle 6. He has tolerated this well so far aside from the development of his bilateral lower  extremity DVTs.  He has had a nice response to the Xarelto. Swelling and pain in his legs have resolved. He has had no issue with bleeding.  CBC and CMP look good. His total protein has improved. We will proceed with cycle 7 today as planned.  He will also receive Zometa. He starts a new cycle of Revlimid on Friday.  We will increase his Duragesic patch to 50 mcg for his lower back pain. Dr. Marin Olp is also considering doing radiation therapy to the thoracic  spine if necessary.  He has also expressed interest in speaking with someone who has already been through the transplant process. I have contacted another patient or ours who has just completed transplant at [facility] and they would like to be his mentor. Mr. Juhasz is very excited about this.   He will received a new treatment and appointment schedule today.  Both he and his wife know to contact our office with any questions or concerns. We can certainly see him sooner if need be.   Eliezer Bottom, NP 10/11/20179:15 AM

## 2016-04-24 ENCOUNTER — Encounter: Payer: Self-pay | Admitting: Nurse Practitioner

## 2016-04-24 LAB — IGG, IGA, IGM
IGA/IMMUNOGLOBULIN A, SERUM: 22 mg/dL — AB (ref 90–386)
IGM (IMMUNOGLOBIN M), SRM: 20 mg/dL (ref 20–172)

## 2016-04-24 LAB — KAPPA/LAMBDA LIGHT CHAINS
IG KAPPA FREE LIGHT CHAIN: 21.6 mg/L — AB (ref 3.3–19.4)
Ig Lambda Free Light Chain: 7.6 mg/L (ref 5.7–26.3)
Kappa/Lambda FluidC Ratio: 2.84 — ABNORMAL HIGH (ref 0.26–1.65)

## 2016-04-25 ENCOUNTER — Encounter: Payer: Self-pay | Admitting: Nurse Practitioner

## 2016-04-25 LAB — PROTEIN ELECTROPHORESIS, SERUM, WITH REFLEX
A/G RATIO SPE: 1.4 (ref 0.7–1.7)
Albumin: 3.8 g/dL (ref 2.9–4.4)
Alpha 1: 0.2 g/dL (ref 0.0–0.4)
Alpha 2: 0.6 g/dL (ref 0.4–1.0)
BETA: 0.8 g/dL (ref 0.7–1.3)
GAMMA GLOBULIN: 1.1 g/dL (ref 0.4–1.8)
GLOBULIN, TOTAL: 2.8 g/dL (ref 2.2–3.9)
Interpretation(See Below): 0
M-SPIKE, %: 0.9 g/dL — AB
PDF SPE: 0
TOTAL PROTEIN: 6.6 g/dL (ref 6.0–8.5)

## 2016-04-30 ENCOUNTER — Ambulatory Visit (HOSPITAL_BASED_OUTPATIENT_CLINIC_OR_DEPARTMENT_OTHER): Payer: 59

## 2016-04-30 ENCOUNTER — Other Ambulatory Visit (HOSPITAL_BASED_OUTPATIENT_CLINIC_OR_DEPARTMENT_OTHER): Payer: 59

## 2016-04-30 VITALS — BP 116/71 | HR 70 | Temp 98.3°F | Resp 18

## 2016-04-30 DIAGNOSIS — D472 Monoclonal gammopathy: Secondary | ICD-10-CM

## 2016-04-30 DIAGNOSIS — Z5112 Encounter for antineoplastic immunotherapy: Secondary | ICD-10-CM | POA: Diagnosis not present

## 2016-04-30 DIAGNOSIS — M545 Low back pain, unspecified: Secondary | ICD-10-CM

## 2016-04-30 DIAGNOSIS — C9000 Multiple myeloma not having achieved remission: Secondary | ICD-10-CM

## 2016-04-30 DIAGNOSIS — G8929 Other chronic pain: Secondary | ICD-10-CM

## 2016-04-30 LAB — CBC WITH DIFFERENTIAL (CANCER CENTER ONLY)
BASO#: 0 10*3/uL (ref 0.0–0.2)
BASO%: 0.8 % (ref 0.0–2.0)
EOS%: 6.2 % (ref 0.0–7.0)
Eosinophils Absolute: 0.2 10*3/uL (ref 0.0–0.5)
HEMATOCRIT: 38.4 % — AB (ref 38.7–49.9)
HEMOGLOBIN: 13 g/dL (ref 13.0–17.1)
LYMPH#: 1.1 10*3/uL (ref 0.9–3.3)
LYMPH%: 29.3 % (ref 14.0–48.0)
MCH: 33 pg (ref 28.0–33.4)
MCHC: 33.9 g/dL (ref 32.0–35.9)
MCV: 98 fL (ref 82–98)
MONO#: 0.6 10*3/uL (ref 0.1–0.9)
MONO%: 17.1 % — AB (ref 0.0–13.0)
NEUT%: 46.6 % (ref 40.0–80.0)
NEUTROS ABS: 1.7 10*3/uL (ref 1.5–6.5)
Platelets: 184 10*3/uL (ref 145–400)
RBC: 3.94 10*6/uL — AB (ref 4.20–5.70)
RDW: 14 % (ref 11.1–15.7)
WBC: 3.7 10*3/uL — AB (ref 4.0–10.0)

## 2016-04-30 LAB — TECHNOLOGIST REVIEW CHCC SATELLITE

## 2016-04-30 LAB — CMP (CANCER CENTER ONLY)
ALBUMIN: 3.9 g/dL (ref 3.3–5.5)
ALK PHOS: 73 U/L (ref 26–84)
ALT: 35 U/L (ref 10–47)
AST: 29 U/L (ref 11–38)
BILIRUBIN TOTAL: 0.5 mg/dL (ref 0.20–1.60)
BUN, Bld: 13 mg/dL (ref 7–22)
CALCIUM: 9.2 mg/dL (ref 8.0–10.3)
CO2: 27 meq/L (ref 18–33)
CREATININE: 0.8 mg/dL (ref 0.6–1.2)
Chloride: 101 mEq/L (ref 98–108)
Glucose, Bld: 86 mg/dL (ref 73–118)
Potassium: 4.5 mEq/L (ref 3.3–4.7)
SODIUM: 139 meq/L (ref 128–145)
TOTAL PROTEIN: 7 g/dL (ref 6.4–8.1)

## 2016-04-30 LAB — LACTATE DEHYDROGENASE: LDH: 182 U/L (ref 125–245)

## 2016-04-30 MED ORDER — BORTEZOMIB CHEMO SQ INJECTION 3.5 MG (2.5MG/ML)
1.3000 mg/m2 | Freq: Once | INTRAMUSCULAR | Status: AC
  Administered 2016-04-30: 2.5 mg via SUBCUTANEOUS
  Filled 2016-04-30: qty 2.5

## 2016-04-30 MED ORDER — PROCHLORPERAZINE MALEATE 10 MG PO TABS
ORAL_TABLET | ORAL | Status: AC
  Filled 2016-04-30: qty 1

## 2016-04-30 MED ORDER — PROCHLORPERAZINE MALEATE 10 MG PO TABS
10.0000 mg | ORAL_TABLET | Freq: Once | ORAL | Status: AC
Start: 2016-04-30 — End: 2016-04-30
  Administered 2016-04-30: 10 mg via ORAL

## 2016-04-30 NOTE — Patient Instructions (Signed)
Bortezomib injection What is this medicine? BORTEZOMIB (bor TEZ oh mib) is a medicine that targets proteins in cancer cells and stops the cancer cells from growing. It is used to treat multiple myeloma and mantle-cell lymphoma. This medicine may be used for other purposes; ask your health care provider or pharmacist if you have questions. What should I tell my health care provider before I take this medicine? They need to know if you have any of these conditions: -diabetes -heart disease -irregular heartbeat -liver disease -on hemodialysis -low blood counts, like low white blood cells, platelets, or hemoglobin -peripheral neuropathy -taking medicine for blood pressure -an unusual or allergic reaction to bortezomib, mannitol, boron, other medicines, foods, dyes, or preservatives -pregnant or trying to get pregnant -breast-feeding How should I use this medicine? This medicine is for injection into a vein or for injection under the skin. It is given by a health care professional in a hospital or clinic setting. Talk to your pediatrician regarding the use of this medicine in children. Special care may be needed. Overdosage: If you think you have taken too much of this medicine contact a poison control center or emergency room at once. NOTE: This medicine is only for you. Do not share this medicine with others. What if I miss a dose? It is important not to miss your dose. Call your doctor or health care professional if you are unable to keep an appointment. What may interact with this medicine? This medicine may interact with the following medications: -ketoconazole -rifampin -ritonavir -St. John's Wort This list may not describe all possible interactions. Give your health care provider a list of all the medicines, herbs, non-prescription drugs, or dietary supplements you use. Also tell them if you smoke, drink alcohol, or use illegal drugs. Some items may interact with your medicine. What  should I watch for while using this medicine? Visit your doctor for checks on your progress. This drug may make you feel generally unwell. This is not uncommon, as chemotherapy can affect healthy cells as well as cancer cells. Report any side effects. Continue your course of treatment even though you feel ill unless your doctor tells you to stop. You may get drowsy or dizzy. Do not drive, use machinery, or do anything that needs mental alertness until you know how this medicine affects you. Do not stand or sit up quickly, especially if you are an older patient. This reduces the risk of dizzy or fainting spells. In some cases, you may be given additional medicines to help with side effects. Follow all directions for their use. Call your doctor or health care professional for advice if you get a fever, chills or sore throat, or other symptoms of a cold or flu. Do not treat yourself. This drug decreases your body's ability to fight infections. Try to avoid being around people who are sick. This medicine may increase your risk to bruise or bleed. Call your doctor or health care professional if you notice any unusual bleeding. You may need blood work done while you are taking this medicine. In some patients, this medicine may cause a serious brain infection that may cause death. If you have any problems seeing, thinking, speaking, walking, or standing, tell your doctor right away. If you cannot reach your doctor, urgently seek other source of medical care. Do not become pregnant while taking this medicine. Women should inform their doctor if they wish to become pregnant or think they might be pregnant. There is a potential for serious  side effects to an unborn child. Talk to your health care professional or pharmacist for more information. Do not breast-feed an infant while taking this medicine. Check with your doctor or health care professional if you get an attack of severe diarrhea, nausea and vomiting, or if  you sweat a lot. The loss of too much body fluid can make it dangerous for you to take this medicine. What side effects may I notice from receiving this medicine? Side effects that you should report to your doctor or health care professional as soon as possible: -allergic reactions like skin rash, itching or hives, swelling of the face, lips, or tongue -breathing problems -changes in hearing -changes in vision -fast, irregular heartbeat -feeling faint or lightheaded, falls -pain, tingling, numbness in the hands or feet -right upper belly pain -seizures -swelling of the ankles, feet, hands -unusual bleeding or bruising -unusually weak or tired -vomiting -yellowing of the eyes or skin Side effects that usually do not require medical attention (report to your doctor or health care professional if they continue or are bothersome): -changes in emotions or moods -constipation -diarrhea -loss of appetite -headache -irritation at site where injected -nausea This list may not describe all possible side effects. Call your doctor for medical advice about side effects. You may report side effects to FDA at 1-800-FDA-1088. Where should I keep my medicine? This drug is given in a hospital or clinic and will not be stored at home. NOTE: This sheet is a summary. It may not cover all possible information. If you have questions about this medicine, talk to your doctor, pharmacist, or health care provider.    2016, Elsevier/Gold Standard. (2014-08-29 14:47:04)

## 2016-05-01 LAB — KAPPA/LAMBDA LIGHT CHAINS
IG KAPPA FREE LIGHT CHAIN: 20.7 mg/L — AB (ref 3.3–19.4)
Ig Lambda Free Light Chain: 7.5 mg/L (ref 5.7–26.3)
Kappa/Lambda FluidC Ratio: 2.76 — ABNORMAL HIGH (ref 0.26–1.65)

## 2016-05-05 LAB — MULTIPLE MYELOMA PANEL, SERUM
ALBUMIN/GLOB SERPL: 1.3 (ref 0.7–1.7)
ALPHA 1: 0.3 g/dL (ref 0.0–0.4)
Albumin SerPl Elph-Mcnc: 3.8 g/dL (ref 2.9–4.4)
Alpha2 Glob SerPl Elph-Mcnc: 0.8 g/dL (ref 0.4–1.0)
B-Globulin SerPl Elph-Mcnc: 0.9 g/dL (ref 0.7–1.3)
GAMMA GLOB SERPL ELPH-MCNC: 1.1 g/dL (ref 0.4–1.8)
GLOBULIN, TOTAL: 3.1 g/dL (ref 2.2–3.9)
IGA/IMMUNOGLOBULIN A, SERUM: 22 mg/dL — AB (ref 90–386)
IGM (IMMUNOGLOBIN M), SRM: 19 mg/dL — AB (ref 20–172)
IgG, Qn, Serum: 1105 mg/dL (ref 700–1600)
M Protein SerPl Elph-Mcnc: 0.9 g/dL — ABNORMAL HIGH
Total Protein: 6.9 g/dL (ref 6.0–8.5)

## 2016-05-07 ENCOUNTER — Other Ambulatory Visit: Payer: 59

## 2016-05-07 ENCOUNTER — Encounter: Payer: Self-pay | Admitting: Family

## 2016-05-07 ENCOUNTER — Ambulatory Visit (HOSPITAL_BASED_OUTPATIENT_CLINIC_OR_DEPARTMENT_OTHER): Payer: 59

## 2016-05-07 ENCOUNTER — Ambulatory Visit: Payer: 59

## 2016-05-07 ENCOUNTER — Ambulatory Visit (HOSPITAL_BASED_OUTPATIENT_CLINIC_OR_DEPARTMENT_OTHER): Payer: 59 | Admitting: Family

## 2016-05-07 ENCOUNTER — Other Ambulatory Visit (HOSPITAL_BASED_OUTPATIENT_CLINIC_OR_DEPARTMENT_OTHER): Payer: 59

## 2016-05-07 DIAGNOSIS — C9000 Multiple myeloma not having achieved remission: Secondary | ICD-10-CM

## 2016-05-07 DIAGNOSIS — I82413 Acute embolism and thrombosis of femoral vein, bilateral: Secondary | ICD-10-CM

## 2016-05-07 DIAGNOSIS — M545 Low back pain: Secondary | ICD-10-CM | POA: Diagnosis not present

## 2016-05-07 DIAGNOSIS — D472 Monoclonal gammopathy: Secondary | ICD-10-CM

## 2016-05-07 DIAGNOSIS — Z5112 Encounter for antineoplastic immunotherapy: Secondary | ICD-10-CM

## 2016-05-07 DIAGNOSIS — G8929 Other chronic pain: Secondary | ICD-10-CM

## 2016-05-07 LAB — CMP (CANCER CENTER ONLY)
ALBUMIN: 3.7 g/dL (ref 3.3–5.5)
ALT(SGPT): 51 U/L — ABNORMAL HIGH (ref 10–47)
AST: 37 U/L (ref 11–38)
Alkaline Phosphatase: 64 U/L (ref 26–84)
BUN, Bld: 12 mg/dL (ref 7–22)
CALCIUM: 9 mg/dL (ref 8.0–10.3)
CHLORIDE: 103 meq/L (ref 98–108)
CO2: 26 meq/L (ref 18–33)
CREATININE: 1 mg/dL (ref 0.6–1.2)
Glucose, Bld: 105 mg/dL (ref 73–118)
POTASSIUM: 4.2 meq/L (ref 3.3–4.7)
Sodium: 138 mEq/L (ref 128–145)
TOTAL PROTEIN: 6.6 g/dL (ref 6.4–8.1)
Total Bilirubin: 0.6 mg/dl (ref 0.20–1.60)

## 2016-05-07 LAB — CBC WITH DIFFERENTIAL (CANCER CENTER ONLY)
BASO#: 0 10*3/uL (ref 0.0–0.2)
BASO%: 0.1 % (ref 0.0–2.0)
EOS ABS: 0.1 10*3/uL (ref 0.0–0.5)
EOS%: 1.9 % (ref 0.0–7.0)
HEMATOCRIT: 39.3 % (ref 38.7–49.9)
HEMOGLOBIN: 13.2 g/dL (ref 13.0–17.1)
LYMPH#: 0.9 10*3/uL (ref 0.9–3.3)
LYMPH%: 12.9 % — ABNORMAL LOW (ref 14.0–48.0)
MCH: 32.9 pg (ref 28.0–33.4)
MCHC: 33.6 g/dL (ref 32.0–35.9)
MCV: 98 fL (ref 82–98)
MONO#: 0.6 10*3/uL (ref 0.1–0.9)
MONO%: 8 % (ref 0.0–13.0)
NEUT%: 77.1 % (ref 40.0–80.0)
NEUTROS ABS: 5.6 10*3/uL (ref 1.5–6.5)
Platelets: 222 10*3/uL (ref 145–400)
RBC: 4.01 10*6/uL — AB (ref 4.20–5.70)
RDW: 14.5 % (ref 11.1–15.7)
WBC: 7.3 10*3/uL (ref 4.0–10.0)

## 2016-05-07 MED ORDER — SENNA 8.6 MG PO TABS: 2.0000 | ORAL_TABLET | Freq: Every day | ORAL | 2 refills | Status: DC

## 2016-05-07 MED ORDER — SODIUM CHLORIDE 0.9 % IV SOLN
Freq: Once | INTRAVENOUS | Status: AC
  Administered 2016-05-07: 11:00:00 via INTRAVENOUS

## 2016-05-07 MED ORDER — FENTANYL 50 MCG/HR TD PT72: 50.0000 ug | MEDICATED_PATCH | TRANSDERMAL | 0 refills | Status: DC

## 2016-05-07 MED ORDER — SODIUM CHLORIDE 0.9 % IV SOLN: Freq: Once | INTRAVENOUS | Status: DC

## 2016-05-07 MED ORDER — PROCHLORPERAZINE MALEATE 10 MG PO TABS
ORAL_TABLET | ORAL | Status: AC
  Filled 2016-05-07: qty 1

## 2016-05-07 MED ORDER — PROCHLORPERAZINE MALEATE 10 MG PO TABS
10.0000 mg | ORAL_TABLET | Freq: Once | ORAL | Status: AC
  Administered 2016-05-07: 10 mg via ORAL

## 2016-05-07 MED ORDER — BORTEZOMIB CHEMO SQ INJECTION 3.5 MG (2.5MG/ML)
1.3000 mg/m2 | Freq: Once | INTRAMUSCULAR | Status: AC
  Administered 2016-05-07: 2.5 mg via SUBCUTANEOUS
  Filled 2016-05-07: qty 1

## 2016-05-07 NOTE — Patient Instructions (Signed)
Bortezomib injection What is this medicine? BORTEZOMIB (bor TEZ oh mib) is a medicine that targets proteins in cancer cells and stops the cancer cells from growing. It is used to treat multiple myeloma and mantle-cell lymphoma. This medicine may be used for other purposes; ask your health care provider or pharmacist if you have questions. What should I tell my health care provider before I take this medicine? They need to know if you have any of these conditions: -diabetes -heart disease -irregular heartbeat -liver disease -on hemodialysis -low blood counts, like low white blood cells, platelets, or hemoglobin -peripheral neuropathy -taking medicine for blood pressure -an unusual or allergic reaction to bortezomib, mannitol, boron, other medicines, foods, dyes, or preservatives -pregnant or trying to get pregnant -breast-feeding How should I use this medicine? This medicine is for injection into a vein or for injection under the skin. It is given by a health care professional in a hospital or clinic setting. Talk to your pediatrician regarding the use of this medicine in children. Special care may be needed. Overdosage: If you think you have taken too much of this medicine contact a poison control center or emergency room at once. NOTE: This medicine is only for you. Do not share this medicine with others. What if I miss a dose? It is important not to miss your dose. Call your doctor or health care professional if you are unable to keep an appointment. What may interact with this medicine? This medicine may interact with the following medications: -ketoconazole -rifampin -ritonavir -St. John's Wort This list may not describe all possible interactions. Give your health care provider a list of all the medicines, herbs, non-prescription drugs, or dietary supplements you use. Also tell them if you smoke, drink alcohol, or use illegal drugs. Some items may interact with your medicine. What  should I watch for while using this medicine? Visit your doctor for checks on your progress. This drug may make you feel generally unwell. This is not uncommon, as chemotherapy can affect healthy cells as well as cancer cells. Report any side effects. Continue your course of treatment even though you feel ill unless your doctor tells you to stop. You may get drowsy or dizzy. Do not drive, use machinery, or do anything that needs mental alertness until you know how this medicine affects you. Do not stand or sit up quickly, especially if you are an older patient. This reduces the risk of dizzy or fainting spells. In some cases, you may be given additional medicines to help with side effects. Follow all directions for their use. Call your doctor or health care professional for advice if you get a fever, chills or sore throat, or other symptoms of a cold or flu. Do not treat yourself. This drug decreases your body's ability to fight infections. Try to avoid being around people who are sick. This medicine may increase your risk to bruise or bleed. Call your doctor or health care professional if you notice any unusual bleeding. You may need blood work done while you are taking this medicine. In some patients, this medicine may cause a serious brain infection that may cause death. If you have any problems seeing, thinking, speaking, walking, or standing, tell your doctor right away. If you cannot reach your doctor, urgently seek other source of medical care. Do not become pregnant while taking this medicine. Women should inform their doctor if they wish to become pregnant or think they might be pregnant. There is a potential for serious  side effects to an unborn child. Talk to your health care professional or pharmacist for more information. Do not breast-feed an infant while taking this medicine. Check with your doctor or health care professional if you get an attack of severe diarrhea, nausea and vomiting, or if  you sweat a lot. The loss of too much body fluid can make it dangerous for you to take this medicine. What side effects may I notice from receiving this medicine? Side effects that you should report to your doctor or health care professional as soon as possible: -allergic reactions like skin rash, itching or hives, swelling of the face, lips, or tongue -breathing problems -changes in hearing -changes in vision -fast, irregular heartbeat -feeling faint or lightheaded, falls -pain, tingling, numbness in the hands or feet -right upper belly pain -seizures -swelling of the ankles, feet, hands -unusual bleeding or bruising -unusually weak or tired -vomiting -yellowing of the eyes or skin Side effects that usually do not require medical attention (report to your doctor or health care professional if they continue or are bothersome): -changes in emotions or moods -constipation -diarrhea -loss of appetite -headache -irritation at site where injected -nausea This list may not describe all possible side effects. Call your doctor for medical advice about side effects. You may report side effects to FDA at 1-800-FDA-1088. Where should I keep my medicine? This drug is given in a hospital or clinic and will not be stored at home. NOTE: This sheet is a summary. It may not cover all possible information. If you have questions about this medicine, talk to your doctor, pharmacist, or health care provider.    2016, Elsevier/Gold Standard. (2014-08-29 14:47:04)

## 2016-05-07 NOTE — Progress Notes (Addendum)
Hematology and Oncology Follow Up Visit  Jorge [name] [id number] [birth year] 60 y.o. 05/07/2016   Principle Diagnosis:  IgG Kappa myeloma - Hyperdiploid/+11 DVT of the LEFT and RIGHT leg  Current Therapy:   RVD s/p cycle 8 Zometa 4 mg IV q month     Interim History:  Jorge [name] is here today with his wife for follow-up and velcade. He is compliant and doing well on Revlimid. He verbalized that he is taking 1 tablet (25 mg) PO daily for 21 days and off 7 days, repeating this cycle every 28 days. He comes in every 2 weeks for lab work. He knows to call our office is he developes any adverse symptoms.  M-spike earlier this month was down to 0.9 g/dL. IgG level was 1,105 mg/dL and kappa light chain 20.7 mg/L.  He is back to work part time and has had some intermittent fatigue. He has also had a few episodes of acid reflux.  No fever, chills, n/v, cough, rash, dizziness, headache, SOB, chest pain, palpitations, abdominal pain or changes in bladder habits. He has occasional constipation and uses Miralax as needed. He would also like a prescription for Senokot.  He is still having lower back pain since his Kyphoplasty. His Duragesic patch works for 2 days and then he starts to have pain again by the third day. No tenderness, weakness, numbness or tingling in his extremities. He has some puffiness around both ankles. No pitting edema or pain. He has not worn his compression stockings for several days and states when he wears these the puffiness resolves.  He has done well on Xarelto and continues to take 20 mg PO daily. He has had no episodes of bleeding, bruising or petechiae.  No new aches or pains. No falls or syncopal episodes. No lymphadenopathy found on exam.  His appetite has improved and his weight is up 5 lbs since his last visit. He admits that he needs to drink more fluids and feels a little dehydrated today.   Medications:    Medication List       Accurate as of 05/07/16 10:57 AM.  Always use your most recent med list.          acetaminophen 500 MG tablet Commonly known as:  TYLENOL Take 500 mg by mouth every 6 (six) hours as needed.   carisoprodol 350 MG tablet Commonly known as:  SOMA Take 1 tablet (350 mg total) by mouth 4 (four) times daily as needed for muscle spasms.   dexamethasone 4 MG tablet Commonly known as:  DECADRON Take 5 pills at one time with food every week   famciclovir 500 MG tablet Commonly known as:  FAMVIR Take 1 tablet (500 mg total) by mouth daily.   fentaNYL 50 MCG/HR Commonly known as:  DURAGESIC - dosed mcg/hr Place 1 patch (50 mcg total) onto the skin every 3 (three) days.   HYDROcodone-acetaminophen 10-325 MG tablet Commonly known as:  NORCO Take 0.5-1 tablets by mouth every 8 (eight) hours as needed for moderate pain.   ibuprofen 400 MG tablet Commonly known as:  ADVIL,MOTRIN Take 400 mg by mouth every 6 (six) hours as needed for mild pain.   lenalidomide 25 MG capsule Commonly known as:  REVLIMID Take 1 capsule daily for 21 days on rthen 7 days off. Auth # [id number]   lidocaine-prilocaine cream Commonly known as:  EMLA Apply to affected area once   LORazepam 0.5 MG tablet Commonly known as:  ATIVAN Take 1 tablet (0.5  mg total) by mouth every 6 (six) hours as needed (Nausea or vomiting).   magnesium oxide 400 MG tablet Commonly known as:  MAG-OX Take 400 mg by mouth daily.   ondansetron 8 MG tablet Commonly known as:  ZOFRAN Take 1 tablet (8 mg total) by mouth 2 (two) times daily as needed (Nausea or vomiting).   polyethylene glycol powder powder Commonly known as:  MIRALAX Take 17 g by mouth daily.   prochlorperazine 10 MG tablet Commonly known as:  COMPAZINE Take 1 tablet (10 mg total) by mouth every 6 (six) hours as needed for nausea or vomiting.   rivaroxaban 20 MG Tabs tablet Commonly known as:  XARELTO Take 1 tablet (20 mg total) by mouth daily with supper.   sertraline 100 MG tablet Commonly  known as:  ZOLOFT Take 100 mg by mouth daily.   VITAMIN D PO Take 5,000 Units by mouth daily.   zolpidem 10 MG tablet Commonly known as:  AMBIEN Take 1 tablet (10 mg total) by mouth at bedtime as needed.       Allergies: No Known Allergies  Past Medical History, Surgical history, Social history, and Family History were reviewed and updated.  Review of Systems: All other 10 point review of systems is negative.   Physical Exam:  height is '5\' 10"'  (1.778 m) and weight is 178 lb (80.7 kg). His oral temperature is 98.3 F (36.8 C). His blood pressure is 124/75 and his pulse is 71. His respiration is 16.   Wt Readings from Last 3 Encounters:  05/07/16 178 lb (80.7 kg)  04/23/16 173 lb 0.6 oz (78.5 kg)  04/09/16 173 lb 2 oz (78.5 kg)    Ocular: Sclerae unicteric, pupils equal, round and reactive to light Ear-nose-throat: Oropharynx clear, dentition fair Lymphatic: No cervical supraclavicular or axillary adenopathy Lungs no rales or rhonchi, good excursion bilaterally Heart regular rate and rhythm, no murmur appreciated Abd soft, nontender, positive bowel sounds, no liver or spleen tip palpated on exam, no fluid wave MSK has some tenderness along the thoracic spine, no joint edema Neuro: non-focal, well-oriented, appropriate affect Breasts: Deferred  Lab Results  Component Value Date   WBC 7.3 05/07/2016   HGB 13.2 05/07/2016   HCT 39.3 05/07/2016   MCV 98 05/07/2016   PLT 222 05/07/2016   No results found for: FERRITIN, IRON, TIBC, UIBC, IRONPCTSAT Lab Results  Component Value Date   RBC 4.01 (L) 05/07/2016   Lab Results  Component Value Date   KAPLAMBRATIO 2.76 (H) 04/30/2016   Lab Results  Component Value Date   IGGSERUM 1,105 04/30/2016   IGMSERUM 19 (L) 04/30/2016   Lab Results  Component Value Date   MSPIKE 0.9 (H) 04/23/2016     Chemistry      Component Value Date/Time   NA 138 05/07/2016 1003   NA 136 02/22/2016 0804   K 4.2 05/07/2016 1003   K  3.8 02/22/2016 0804   CL 103 05/07/2016 1003   CO2 26 05/07/2016 1003   CO2 22 02/22/2016 0804   BUN 12 05/07/2016 1003   BUN 16.4 02/22/2016 0804   CREATININE 1.0 05/07/2016 1003   CREATININE 1.0 02/22/2016 0804      Component Value Date/Time   CALCIUM 9.0 05/07/2016 1003   CALCIUM 9.8 02/22/2016 0804   ALKPHOS 64 05/07/2016 1003   ALKPHOS 95 02/22/2016 0804   AST 37 05/07/2016 1003   AST 19 02/22/2016 0804   ALT 51 (H) 05/07/2016 1003   ALT  24 02/22/2016 0804   BILITOT 0.60 05/07/2016 1003   BILITOT 0.41 02/22/2016 0804     Impression and Plan: Jorge [name] is a 60 yo white male with IgG Kappa Myeloma receiving RVD s/p cycle 6. He has tolerated this well so far aside from the development of his bilateral lower extremity DVTs.  He has had a nice response to the Xarelto and his swelling is almost completely resolved with only some mild puffiness in the ankles if he doesn't wear his compression stockings.   He will continue on his same dosage of Revlimid 25 mg PO daily on 21 days off 7. He verbalized understanding of this.  We will proceed with Velcade as planned. We will also give him fluids today as well for c/o low fluid intake and dehydration.  Duragesic patch frequency was changed to change patch every 48 hours for his pack pain. He verbalized understanding of this and new prescription was given to him. Also, a prescription for Senokot was sent to his pharmacy.  We will continue to check his labs every 2 weeks and will see him back for MD follow-up in 1 month.  He will received a new treatment and appointment schedule today.  Both he and his wife know to contact our office with any questions or concerns. We can certainly see him sooner if need be.   Eliezer Bottom, NP 10/25/201710:57 AM

## 2016-05-09 ENCOUNTER — Other Ambulatory Visit: Payer: Self-pay | Admitting: *Deleted

## 2016-05-09 DIAGNOSIS — D472 Monoclonal gammopathy: Secondary | ICD-10-CM

## 2016-05-09 DIAGNOSIS — C9000 Multiple myeloma not having achieved remission: Secondary | ICD-10-CM

## 2016-05-09 MED ORDER — LENALIDOMIDE 25 MG PO CAPS: ORAL_CAPSULE | ORAL | 0 refills | Status: DC

## 2016-05-12 ENCOUNTER — Emergency Department (HOSPITAL_BASED_OUTPATIENT_CLINIC_OR_DEPARTMENT_OTHER)
Admission: EM | Admit: 2016-05-12 | Discharge: 2016-05-12 | Disposition: A | Discharge status: Home / Self Care | Payer: 59 | Attending: Emergency Medicine | Admitting: Emergency Medicine

## 2016-05-12 ENCOUNTER — Emergency Department (HOSPITAL_BASED_OUTPATIENT_CLINIC_OR_DEPARTMENT_OTHER): Payer: 59

## 2016-05-12 ENCOUNTER — Encounter (HOSPITAL_BASED_OUTPATIENT_CLINIC_OR_DEPARTMENT_OTHER): Payer: Self-pay | Admitting: Emergency Medicine

## 2016-05-12 DIAGNOSIS — Z87891 Personal history of nicotine dependence: Secondary | ICD-10-CM | POA: Diagnosis not present

## 2016-05-12 DIAGNOSIS — R310 Gross hematuria: Secondary | ICD-10-CM | POA: Diagnosis present

## 2016-05-12 DIAGNOSIS — N201 Calculus of ureter: Secondary | ICD-10-CM | POA: Diagnosis not present

## 2016-05-12 LAB — URINALYSIS, ROUTINE W REFLEX MICROSCOPIC
BILIRUBIN URINE: NEGATIVE
GLUCOSE, UA: NEGATIVE mg/dL
KETONES UR: NEGATIVE mg/dL
LEUKOCYTES UA: NEGATIVE
Nitrite: NEGATIVE
PROTEIN: 30 mg/dL — AB
Specific Gravity, Urine: 1.015 (ref 1.005–1.030)
pH: 6 (ref 5.0–8.0)

## 2016-05-12 LAB — URINE MICROSCOPIC-ADD ON: Bacteria, UA: NONE SEEN

## 2016-05-12 MED ORDER — TAMSULOSIN HCL 0.4 MG PO CAPS: 0.4000 mg | ORAL_CAPSULE | Freq: Once | ORAL | 0 refills | Status: AC

## 2016-05-12 MED ORDER — TAMSULOSIN HCL 0.4 MG PO CAPS
0.4000 mg | ORAL_CAPSULE | Freq: Once | ORAL | Status: AC
  Administered 2016-05-12: 0.4 mg via ORAL
  Filled 2016-05-12: qty 1

## 2016-05-12 NOTE — ED Triage Notes (Signed)
Pt states he's been receiving chemo since early August, finishing up his 3rd round right now; when he woke up this morning to urinate, he noticed blood in the urine, pt denies any pain associated with the bloody urine

## 2016-05-12 NOTE — ED Provider Notes (Signed)
[facility] DEPT [facility] Provider Note: Jorge Spurling, MD, FACEP  CSN: [id number] MRN: [id number] ARRIVAL: 05/12/16 at 0539 ROOM: [place]  Hematuria   HISTORY OF PRESENT ILLNESS  Jorge [name] is a 60 y.o. male undergoing chemotherapy with bortezomib (Velcade) for multiple myeloma. He is on Xarelto for DVTs. We urinated this morning he noticed gross blood in his urine. He is not had hematuria in the past. There is no associated pain. Apart from increased fatigue he has not had any other new symptoms. He does have chronic edema of the lower extremities due to DVTs. CBC with differential on October 25 was unremarkable.   Past Medical History:  Diagnosis Date  . Anxiety   . Depression   . Headache    mild at times  . History of chicken pox   . Measles   . Multiple myeloma ([place]) 02/22/2016  . Myeloma ([place]) 2017    Past Surgical History:  Procedure Laterality Date  . COLONOSCOPY  [id number]  . INGUINAL HERNIA REPAIR  12/16/13   BIH by Dr. Tilmon [place]  . IR GENERIC HISTORICAL  02/11/2016   IR RADIOLOGIST EVAL & MGMT 02/11/2016 [facility]  . IR GENERIC HISTORICAL  02/15/2016   IR BONE TUMOR(S)RF ABLATION 02/15/2016 Luanne Bras, MD [facility]  . IR GENERIC HISTORICAL  02/15/2016   IR BONE TUMOR(S)RF ABLATION 02/15/2016 Luanne Bras, MD [facility]  . IR GENERIC HISTORICAL  02/15/2016   IR BONE TUMOR(S)RF ABLATION 02/15/2016 Luanne Bras, MD [facility]  . IR GENERIC HISTORICAL  02/15/2016   IR KYPHO THORACIC WITH BONE BIOPSY 02/15/2016 Luanne Bras, MD [facility]  . IR GENERIC HISTORICAL  02/15/2016   IR KYPHO THORACIC WITH BONE BIOPSY 02/15/2016 Luanne Bras, MD [facility]  . IR GENERIC HISTORICAL  02/15/2016   IR VERTEBROPLASTY CERV/THOR BX INC UNI/BIL INC/INJECT/IMAGING 02/15/2016 Luanne Bras, MD [facility]  . IR GENERIC HISTORICAL  03/13/2016   IR KYPHO EA ADDL LEVEL THORACIC OR LUMBAR 03/13/2016 Luanne Bras, MD [facility]  . IR GENERIC HISTORICAL  03/13/2016   IR KYPHO EA ADDL LEVEL THORACIC OR LUMBAR 03/13/2016 Luanne Bras, MD [facility]  . IR GENERIC HISTORICAL  03/13/2016   IR BONE TUMOR(S)RF ABLATION 03/13/2016 Luanne Bras, MD [facility]  . IR GENERIC HISTORICAL  03/13/2016   IR KYPHO LUMBAR INC FX REDUCE BONE BX UNI/BIL CANNULATION INC/IMAGING 03/13/2016 Luanne Bras, MD [facility]  . IR GENERIC HISTORICAL  03/13/2016   IR BONE TUMOR(S)RF ABLATION 03/13/2016 Luanne Bras, MD [facility]  . IR GENERIC HISTORICAL  03/13/2016   IR BONE TUMOR(S)RF ABLATION 03/13/2016 Luanne Bras, MD [facility]  . IR GENERIC HISTORICAL  03/31/2016   IR RADIOLOGIST EVAL & MGMT 03/31/2016 [facility]  . RADIOLOGY WITH ANESTHESIA N/A 02/15/2016   Procedure: Spinal Ablation;  Surgeon: Luanne Bras, MD;  Location: [facility];  Service: Radiology;  Laterality: N/A;  . RADIOLOGY WITH ANESTHESIA N/A 03/13/2016   Procedure: LUMBER ABLATION;  Surgeon: Luanne Bras, MD;  Location: [facility];  Service: Radiology;  Laterality: N/A;  . ROTATOR CUFF REPAIR  2003  . TONSILLECTOMY    . WISDOM TOOTH EXTRACTION      Family History  Problem Relation Age of Onset  . Cancer Mother   . Heart disease Father   . Hypertension Father   . Multiple sclerosis Sister   . Paranoid behavior Brother   . Drug abuse Brother   . Schizophrenia Brother   . Stroke Maternal  Grandfather   . Cancer Maternal Aunt   . Leukemia Paternal Aunt   . Healthy Son     x1  . Healthy Daughter     x2  . Allergies Daughter     x1  . Diabetes Neg Hx   . Alzheimer's disease Neg Hx   . Parkinson's disease Neg Hx     Social History  Substance Use Topics  . Smoking status: Former Smoker    Quit date: 11/02/1980  . Smokeless tobacco: Never Used  . Alcohol use Yes     Comment: 1 drink a day    Prior to Admission medications   Medication Sig Start Date End Date Taking? Authorizing Provider  acetaminophen (TYLENOL) 500 MG  tablet Take 500 mg by mouth every 6 (six) hours as needed.    Historical Provider, MD  carisoprodol (SOMA) 350 MG tablet Take 1 tablet (350 mg total) by mouth 4 (four) times daily as needed for muscle spasms. 02/22/16   [name], MD  Cholecalciferol (VITAMIN D PO) Take 5,000 Units by mouth daily.     Historical Provider, MD  dexamethasone (DECADRON) 4 MG tablet Take 5 pills at one time with food every week 02/22/16   [name], MD  famciclovir (FAMVIR) 500 MG tablet Take 1 tablet (500 mg total) by mouth daily. 02/22/16   [name], MD  fentaNYL (DURAGESIC - DOSED MCG/HR) 50 MCG/HR Place 1 patch (50 mcg total) onto the skin every other day. 05/07/16   Eliezer Bottom, NP  HYDROcodone-acetaminophen [facility]) 10-325 MG tablet Take 0.5-1 tablets by mouth every 8 (eight) hours as needed for moderate pain. 02/22/16   [name], MD  ibuprofen (ADVIL,MOTRIN) 400 MG tablet Take 400 mg by mouth every 6 (six) hours as needed for mild pain.     Historical Provider, MD  lenalidomide (REVLIMID) 25 MG capsule Take 1 capsule daily for 21 days on rthen 7 days off. Josem Kaufmann #[id number] 05/09/16   [name], MD  lidocaine-prilocaine (EMLA) cream Apply to affected area once 02/27/16   [name], MD  LORazepam (ATIVAN) 0.5 MG tablet Take 1 tablet (0.5 mg total) by mouth every 6 (six) hours as needed (Nausea or vomiting). 02/27/16   [name], MD  magnesium oxide (MAG-OX) 400 MG tablet Take 400 mg by mouth daily.    Historical Provider, MD  ondansetron (ZOFRAN) 8 MG tablet Take 1 tablet (8 mg total) by mouth 2 (two) times daily as needed (Nausea or vomiting). 02/27/16   [name], MD  polyethylene glycol powder (MIRALAX) powder Take 17 g by mouth daily. 03/26/16   [name], MD  prochlorperazine (COMPAZINE) 10 MG tablet Take 1 tablet (10 mg total) by mouth every 6 (six) hours as needed for nausea or vomiting. 02/19/16   [name], MD  rivaroxaban (XARELTO) 20 MG TABS tablet Take  1 tablet (20 mg total) by mouth daily with supper. 04/18/16   Eliezer Bottom, NP  senna (SENOKOT) 8.6 MG TABS tablet Take 2 tablets (17.2 mg total) by mouth daily. 05/07/16   Eliezer Bottom, NP  sertraline (ZOLOFT) 100 MG tablet Take 100 mg by mouth daily. 01/02/16   Historical Provider, MD  tamsulosin (FLOMAX) 0.4 MG CAPS capsule Take 1 capsule (0.4 mg total) by mouth once. 05/12/16 05/12/16  Shanon Rosser, MD  zolpidem (AMBIEN) 10 MG tablet Take 1 tablet (10 mg total) by mouth at bedtime as needed. 04/09/16  Brunetta Jeans, PA-C    Allergies Review of patient's allergies indicates no known allergies.   REVIEW OF SYSTEMS  Negative except as noted here or in the History of Present Illness.   PHYSICAL EXAMINATION  Initial Vital Signs Blood pressure 112/79, pulse 75, temperature 97.6 F (36.4 C), temperature source Oral, resp. rate 14, SpO2 97 %.  Examination General: Well-developed, well-nourished male in no acute distress; appearance consistent with age of record HENT: normocephalic; atraumatic Eyes: pupils equal, round and reactive to light; extraocular muscles intact Neck: supple Heart: regular rate and rhythm Lungs: clear to auscultation bilaterally Abdomen: soft; nondistended; nontender; no masses or hepatosplenomegaly; bowel sounds present GU: No CVA tenderness Extremities: No deformity; full range of motion; pulses normal; trace edema of lower legs Neurologic: Awake, alert and oriented; motor function intact in all extremities and symmetric; no facial droop Skin: Warm and dry Psychiatric: Normal mood and affect   RESULTS  Summary of this visit's results, reviewed by myself:   EKG Interpretation  Date/Time:    Ventricular Rate:    PR Interval:    QRS Duration:   QT Interval:    QTC Calculation:   R Axis:     Text Interpretation:        Laboratory Studies: Results for orders placed or performed during the hospital encounter of 05/12/16 (from the past 24  hour(s))  Urinalysis, Routine w reflex microscopic (not at [facility])     Status: Abnormal   Collection Time: 05/12/16  6:25 AM  Result Value Ref Range   Color, Urine AMBER (A) YELLOW   APPearance CLOUDY (A) CLEAR   Specific Gravity, Urine 1.015 1.005 - 1.030   pH 6.0 5.0 - 8.0   Glucose, UA NEGATIVE NEGATIVE mg/dL   Hgb urine dipstick LARGE (A) NEGATIVE   Bilirubin Urine NEGATIVE NEGATIVE   Ketones, ur NEGATIVE NEGATIVE mg/dL   Protein, ur 30 (A) NEGATIVE mg/dL   Nitrite NEGATIVE NEGATIVE   Leukocytes, UA NEGATIVE NEGATIVE  Urine microscopic-add on     Status: Abnormal   Collection Time: 05/12/16  6:25 AM  Result Value Ref Range   Squamous Epithelial / LPF 0-5 (A) NONE SEEN   WBC, UA 6-30 0 - 5 WBC/hpf   RBC / HPF TOO NUMEROUS TO COUNT 0 - 5 RBC/hpf   Bacteria, UA NONE SEEN NONE SEEN   Urine-Other URINALYSIS PERFORMED ON SUPERNATANT    Imaging Studies: Ct Renal Stone Study  Result Date: 05/12/2016 CLINICAL DATA:  Acute onset of right flank pain with hematuria beginning yesterday. History of multiple myeloma and vertebral augmentation. EXAM: CT ABDOMEN AND PELVIS WITHOUT CONTRAST TECHNIQUE: Multidetector CT imaging of the abdomen and pelvis was performed following the standard protocol without IV contrast. COMPARISON:  Lumbar spine CT 03/07/2016.  Head CT 02/18/2016. FINDINGS: Lower chest: Large hiatal hernia out. Linear scarring and atelectasis at the lung bases. No pleural or pericardial fluid. Hepatobiliary: Normal without contrast. Pancreas: Normal Spleen: Normal Adrenals/Urinary Tract: Adrenal glands are normal. Left kidney is normal. 7 x 10 mm stone previously seen within the right renal collecting system is now passed to the right ureteropelvic junction. There is mild fullness the renal collecting system associated with this. Nonobstructing 1 mm stone in the right upper pole. Stomach/Bowel: No acute bowel findings. Vascular/Lymphatic: Aortic atherosclerosis. No aneurysm. IVC is normal.  Reproductive: No significant finding. Calcification within the penis not at the location of the urethra. Other: None significant Musculoskeletal: Widespread bone changes of myeloma. Previous augmentations from T11 through  L3. Mild loss of height at L4 and L5 but without evidence of acute fracture. Chronic pars defects at L5. IMPRESSION: 10 x 7 mm stone previously seen in the right renal collecting system has passed to the right UPJ. Mild right hydro nephrosis. 1 mm nonobstructing stone in the right upper pole. Hiatal hernia. Widespread bone changes of myeloma without acute finding. Aortic atherosclerosis. Electronically Signed   By: Nelson Chimes M.D.   On: 05/12/2016 07:19    ED COURSE  Nursing notes and initial vitals signs, including pulse oximetry, reviewed.  Vitals:   05/12/16 0553  BP: 112/79  Pulse: 75  Resp: 14  Temp: 97.6 F (36.4 C)  TempSrc: Oral  SpO2: 97%   Patient advised to CT findings and large stone likely requiring urgent, but not emergent, urologic intervention. We will place him on Flomax. He already has fentanyl patches and hydrocodone at home for pain associated with his multiple myeloma.  PROCEDURES    ED DIAGNOSES     ICD-9-CM ICD-10-CM   1. Ureterolithiasis 592.1 N20.1   2. Gross hematuria 599.71 R31.0        Shanon Rosser, MD 05/12/16 [phone]

## 2016-05-12 NOTE — ED Notes (Signed)
Pt directed to pharmacy to pick up prescription 

## 2016-05-14 ENCOUNTER — Ambulatory Visit (INDEPENDENT_AMBULATORY_CARE_PROVIDER_SITE_OTHER): Payer: 59 | Admitting: Physician Assistant

## 2016-05-14 ENCOUNTER — Encounter: Payer: Self-pay | Admitting: Physician Assistant

## 2016-05-14 ENCOUNTER — Encounter: Payer: 59 | Admitting: Physician Assistant

## 2016-05-14 ENCOUNTER — Telehealth: Payer: Self-pay

## 2016-05-14 VITALS — BP 112/84 | HR 81 | Temp 98.2°F | Ht 70.0 in | Wt 179.0 lb

## 2016-05-14 DIAGNOSIS — Z0001 Encounter for general adult medical examination with abnormal findings: Secondary | ICD-10-CM

## 2016-05-14 DIAGNOSIS — C9000 Multiple myeloma not having achieved remission: Secondary | ICD-10-CM | POA: Diagnosis not present

## 2016-05-14 DIAGNOSIS — K120 Recurrent oral aphthae: Secondary | ICD-10-CM

## 2016-05-14 DIAGNOSIS — Z0000 Encounter for general adult medical examination without abnormal findings: Secondary | ICD-10-CM

## 2016-05-14 DIAGNOSIS — N201 Calculus of ureter: Secondary | ICD-10-CM | POA: Diagnosis not present

## 2016-05-14 LAB — LIPID PANEL
CHOLESTEROL: 210 mg/dL — AB (ref 0–200)
HDL: 59 mg/dL (ref >39.00)
LDL Cholesterol: 124 mg/dL — ABNORMAL HIGH (ref 0–99)
NONHDL: 150.79
Total CHOL/HDL Ratio: 4
Triglycerides: 133 mg/dL (ref 0.0–149.0)
VLDL: 26.6 mg/dL (ref 0.0–40.0)

## 2016-05-14 LAB — HEMOGLOBIN A1C: Hgb A1c MFr Bld: 5.1 % (ref 4.6–6.5)

## 2016-05-14 LAB — PSA: PSA: 0.68 ng/mL (ref 0.10–4.00)

## 2016-05-14 MED ORDER — TRIAMCINOLONE ACETONIDE 0.1 % MT PSTE: 1.0000 "application " | PASTE | Freq: Two times a day (BID) | OROMUCOSAL | 12 refills | Status: DC

## 2016-05-14 NOTE — Patient Instructions (Signed)
Please go to the lab for blood work. I will call with your results.  Please continue chronic medications. I have send a prescription for Kenalog in [facility] to your pharmacy. Please use as directed. Over-the-counter Peroxyl mouthwash will also promote healing. Use a soft-bristled toothbrush.  Please follow-up with Dr. Marin Olp and Dr. Austin Miles recommendation for your kidney stone.   Follow-up will be based on your lab results.  Also check with insurance on coverage for a hepatitis C screening  Preventive Care for Adults, Male A healthy lifestyle and preventive care can promote health and wellness. Preventive health guidelines for men include the following key practices:  A routine yearly physical is a good way to check with your health care provider about your health and preventative screening. It is a chance to share any concerns and updates on your health and to receive a thorough exam.  Visit your dentist for a routine exam and preventative care every 6 months. Brush your teeth twice a day and floss once a day. Good oral hygiene prevents tooth decay and gum disease.  The frequency of eye exams is based on your age, health, family medical history, use of contact lenses, and other factors. Follow your health care provider's recommendations for frequency of eye exams.  Eat a healthy diet. Foods such as vegetables, fruits, whole grains, low-fat dairy products, and lean protein foods contain the nutrients you need without too many calories. Decrease your intake of foods high in solid fats, added sugars, and salt. Eat the right amount of calories for you.Get information about a proper diet from your health care provider, if necessary.  Regular physical exercise is one of the most important things you can do for your health. Most adults should get at least 150 minutes of moderate-intensity exercise (any activity that increases your heart rate and causes you to sweat) each week. In  addition, most adults need muscle-strengthening exercises on 2 or more days a week.  Maintain a healthy weight. The body mass index (BMI) is a screening tool to identify possible weight problems. It provides an estimate of body fat based on height and weight. Your health care provider can find your BMI and can help you achieve or maintain a healthy weight.For adults 20 years and older:  A BMI below 18.5 is considered underweight.  A BMI of 18.5 to 24.9 is normal.  A BMI of 25 to 29.9 is considered overweight.  A BMI of 30 and above is considered obese.  Maintain normal blood lipids and cholesterol levels by exercising and minimizing your intake of saturated fat. Eat a balanced diet with plenty of fruit and vegetables. Blood tests for lipids and cholesterol should begin at age 31 and be repeated every 5 years. If your lipid or cholesterol levels are high, you are over 50, or you are at high risk for heart disease, you may need your cholesterol levels checked more frequently.Ongoing high lipid and cholesterol levels should be treated with medicines if diet and exercise are not working.  If you smoke, find out from your health care provider how to quit. If you do not use tobacco, do not start.  Lung cancer screening is recommended for adults aged 50-80 years who are at high risk for developing lung cancer because of a history of smoking. A yearly low-dose CT scan of the lungs is recommended for people who have at least a 30-pack-year history of smoking and are a current smoker or have quit within the past  15 years. A pack year of smoking is smoking an average of 1 pack of cigarettes a day for 1 year (for example: 1 pack a day for 30 years or 2 packs a day for 15 years). Yearly screening should continue until the smoker has stopped smoking for at least 15 years. Yearly screening should be stopped for people who develop a health problem that would prevent them from having lung cancer treatment.  If  you choose to drink alcohol, do not have more than 2 drinks per day. One drink is considered to be 12 ounces (355 mL) of beer, 5 ounces (148 mL) of wine, or 1.5 ounces (44 mL) of liquor.  Avoid use of street drugs. Do not share needles with anyone. Ask for help if you need support or instructions about stopping the use of drugs.  High blood pressure causes heart disease and increases the risk of stroke. Your blood pressure should be checked at least every 1-2 years. Ongoing high blood pressure should be treated with medicines, if weight loss and exercise are not effective.  If you are 59-48 years old, ask your health care provider if you should take aspirin to prevent heart disease.  Diabetes screening is done by taking a blood sample to check your blood glucose level after you have not eaten for a certain period of time (fasting). If you are not overweight and you do not have risk factors for diabetes, you should be screened once every 3 years starting at age 69. If you are overweight or obese and you are 38-28 years of age, you should be screened for diabetes every year as part of your cardiovascular risk assessment.  Colorectal cancer can be detected and often prevented. Most routine colorectal cancer screening begins at the age of 62 and continues through age 49. However, your health care provider may recommend screening at an earlier age if you have risk factors for colon cancer. On a yearly basis, your health care provider may provide home test kits to check for hidden blood in the stool. Use of a small camera at the end of a tube to directly examine the colon (sigmoidoscopy or colonoscopy) can detect the earliest forms of colorectal cancer. Talk to your health care provider about this at age 68, when routine screening begins. Direct exam of the colon should be repeated every 5-10 years through age 6, unless early forms of precancerous polyps or small growths are found.  People who are at an  increased risk for hepatitis B should be screened for this virus. You are considered at high risk for hepatitis B if:  You were born in a country where hepatitis B occurs often. Talk with your health care provider about which countries are considered high risk.  Your parents were born in a high-risk country and you have not received a shot to protect against hepatitis B (hepatitis B vaccine).  You have HIV or AIDS.  You use needles to inject street drugs.  You live with, or have sex with, someone who has hepatitis B.  You are a man who has sex with other men (MSM).  You get hemodialysis treatment.  You take certain medicines for conditions such as cancer, organ transplantation, and autoimmune conditions.  Hepatitis C blood testing is recommended for all people born from [birth year] through 1965 and any individual with known risks for hepatitis C.  Practice safe sex. Use condoms and avoid high-risk sexual practices to reduce the spread of sexually transmitted infections (STIs).  STIs include gonorrhea, chlamydia, syphilis, trichomonas, herpes, HPV, and human immunodeficiency virus (HIV). Herpes, HIV, and HPV are viral illnesses that have no cure. They can result in disability, cancer, and death.  If you are a man who has sex with other men, you should be screened at least once per year for:  HIV.  Urethral, rectal, and pharyngeal infection of gonorrhea, chlamydia, or both.  If you are at risk of being infected with HIV, it is recommended that you take a prescription medicine daily to prevent HIV infection. This is called preexposure prophylaxis (PrEP). You are considered at risk if:  You are a man who has sex with other men (MSM) and have other risk factors.  You are a heterosexual man, are sexually active, and are at increased risk for HIV infection.  You take drugs by injection.  You are sexually active with a partner who has HIV.  Talk with your health care provider about whether you  are at high risk of being infected with HIV. If you choose to begin PrEP, you should first be tested for HIV. You should then be tested every 3 months for as long as you are taking PrEP.  A one-time screening for abdominal aortic aneurysm (AAA) and surgical repair of large AAAs by ultrasound are recommended for men ages 47 to 34 years who are current or former smokers.  Healthy men should no longer receive prostate-specific antigen (PSA) blood tests as part of routine cancer screening. Talk with your health care provider about prostate cancer screening.  Testicular cancer screening is not recommended for adult males who have no symptoms. Screening includes self-exam, a health care provider exam, and other screening tests. Consult with your health care provider about any symptoms you have or any concerns you have about testicular cancer.  Use sunscreen. Apply sunscreen liberally and repeatedly throughout the day. You should seek shade when your shadow is shorter than you. Protect yourself by wearing long sleeves, pants, a wide-brimmed hat, and sunglasses year round, whenever you are outdoors.  Once a month, do a whole-body skin exam, using a mirror to look at the skin on your back. Tell your health care provider about new moles, moles that have irregular borders, moles that are larger than a pencil eraser, or moles that have changed in shape or color.  Stay current with required vaccines (immunizations).  Influenza vaccine. All adults should be immunized every year.  Tetanus, diphtheria, and acellular pertussis (Td, Tdap) vaccine. An adult who has not previously received Tdap or who does not know his vaccine status should receive 1 dose of Tdap. This initial dose should be followed by tetanus and diphtheria toxoids (Td) booster doses every 10 years. Adults with an unknown or incomplete history of completing a 3-dose immunization series with Td-containing vaccines should begin or complete a primary  immunization series including a Tdap dose. Adults should receive a Td booster every 10 years.  Varicella vaccine. An adult without evidence of immunity to varicella should receive 2 doses or a second dose if he has previously received 1 dose.  Human papillomavirus (HPV) vaccine. Males aged 11-21 years who have not received the vaccine previously should receive the 3-dose series. Males aged 22-26 years may be immunized. Immunization is recommended through the age of 52 years for any male who has sex with males and did not get any or all doses earlier. Immunization is recommended for any person with an immunocompromised condition through the age of 56 years if he  did not get any or all doses earlier. During the 3-dose series, the second dose should be obtained 4-8 weeks after the first dose. The third dose should be obtained 24 weeks after the first dose and 16 weeks after the second dose.  Zoster vaccine. One dose is recommended for adults aged 17 years or older unless certain conditions are present.  Measles, mumps, and rubella (MMR) vaccine. Adults born before [birth year] generally are considered immune to measles and mumps. Adults born in [birth year] or later should have 1 or more doses of MMR vaccine unless there is a contraindication to the vaccine or there is laboratory evidence of immunity to each of the three diseases. A routine second dose of MMR vaccine should be obtained at least 28 days after the first dose for students attending postsecondary schools, health care workers, or international travelers. People who received inactivated measles vaccine or an unknown type of measles vaccine during 1963-1967 should receive 2 doses of MMR vaccine. People who received inactivated mumps vaccine or an unknown type of mumps vaccine before 1979 and are at high risk for mumps infection should consider immunization with 2 doses of MMR vaccine. Unvaccinated health care workers born before [birth year] who lack laboratory evidence of  measles, mumps, or rubella immunity or laboratory confirmation of disease should consider measles and mumps immunization with 2 doses of MMR vaccine or rubella immunization with 1 dose of MMR vaccine.  Pneumococcal 13-valent conjugate (PCV13) vaccine. When indicated, a person who is uncertain of his immunization history and has no record of immunization should receive the PCV13 vaccine. All adults 58 years of age and older should receive this vaccine. An adult aged 54 years or older who has certain medical conditions and has not been previously immunized should receive 1 dose of PCV13 vaccine. This PCV13 should be followed with a dose of pneumococcal polysaccharide (PPSV23) vaccine. Adults who are at high risk for pneumococcal disease should obtain the PPSV23 vaccine at least 8 weeks after the dose of PCV13 vaccine. Adults older than 60 years of age who have normal immune system function should obtain the PPSV23 vaccine dose at least 1 year after the dose of PCV13 vaccine.  Pneumococcal polysaccharide (PPSV23) vaccine. When PCV13 is also indicated, PCV13 should be obtained first. All adults aged 43 years and older should be immunized. An adult younger than age 63 years who has certain medical conditions should be immunized. Any person who resides in a nursing home or long-term care facility should be immunized. An adult smoker should be immunized. People with an immunocompromised condition and certain other conditions should receive both PCV13 and PPSV23 vaccines. People with human immunodeficiency virus (HIV) infection should be immunized as soon as possible after diagnosis. Immunization during chemotherapy or radiation therapy should be avoided. Routine use of PPSV23 vaccine is not recommended for American Indians, [place]atives, or people younger than 65 years unless there are medical conditions that require PPSV23 vaccine. When indicated, people who have unknown immunization and have no record of  immunization should receive PPSV23 vaccine. One-time revaccination 5 years after the first dose of PPSV23 is recommended for people aged 19-64 years who have chronic kidney failure, nephrotic syndrome, asplenia, or immunocompromised conditions. People who received 1-2 doses of PPSV23 before age 28 years should receive another dose of PPSV23 vaccine at age 39 years or later if at least 5 years have passed since the previous dose. Doses of PPSV23 are not needed for people immunized with PPSV23 at or after  age 101 years.  Meningococcal vaccine. Adults with asplenia or persistent complement component deficiencies should receive 2 doses of quadrivalent meningococcal conjugate (MenACWY-D) vaccine. The doses should be obtained at least 2 months apart. Microbiologists working with certain meningococcal bacteria, [employer] recruits, people at risk during an outbreak, and people who travel to or live in countries with a high rate of meningitis should be immunized. A first-year college student up through age 74 years who is living in a residence hall should receive a dose if he did not receive a dose on or after his 16th birthday. Adults who have certain high-risk conditions should receive one or more doses of vaccine.  Hepatitis A vaccine. Adults who wish to be protected from this disease, have chronic liver disease, work with hepatitis A-infected animals, work in hepatitis A research labs, or travel to or work in countries with a high rate of hepatitis A should be immunized. Adults who were previously unvaccinated and who anticipate close contact with an international adoptee during the first 60 days after arrival in the [place] from a country with a high rate of hepatitis A should be immunized.  Hepatitis B vaccine. Adults should be immunized if they wish to be protected from this disease, are under age 58 years and have diabetes, have chronic liver disease, have had more than one sex partner in the past 6 months,  may be exposed to blood or other infectious body fluids, are household contacts or sex partners of hepatitis B positive people, are clients or workers in certain care facilities, or travel to or work in countries with a high rate of hepatitis B.  Haemophilus influenzae type b (Hib) vaccine. A previously unvaccinated person with asplenia or sickle cell disease or having a scheduled splenectomy should receive 1 dose of Hib vaccine. Regardless of previous immunization, a recipient of a hematopoietic stem cell transplant should receive a 3-dose series 6-12 months after his successful transplant. Hib vaccine is not recommended for adults with HIV infection. Preventive Service / Frequency Ages 110 to 45  Blood pressure check.** / Every 3-5 years.  Lipid and cholesterol check.** / Every 5 years beginning at age 69.  Hepatitis C blood test.** / For any individual with known risks for hepatitis C.  Skin self-exam. / Monthly.  Influenza vaccine. / Every year.  Tetanus, diphtheria, and acellular pertussis (Tdap, Td) vaccine.** / Consult your health care provider. 1 dose of Td every 10 years.  Varicella vaccine.** / Consult your health care provider.  HPV vaccine. / 3 doses over 6 months, if 29 or younger.  Measles, mumps, rubella (MMR) vaccine.** / You need at least 1 dose of MMR if you were born in 1957 or later. You may also need a second dose.  Pneumococcal 13-valent conjugate (PCV13) vaccine.** / Consult your health care provider.  Pneumococcal polysaccharide (PPSV23) vaccine.** / 1 to 2 doses if you smoke cigarettes or if you have certain conditions.  Meningococcal vaccine.** / 1 dose if you are age 37 to 37 years and a [employer] researcher living in a residence hall, or have one of several medical conditions. You may also need additional booster doses.  Hepatitis A vaccine.** / Consult your health care provider.  Hepatitis B vaccine.** / Consult your health care  provider.  Haemophilus influenzae type b (Hib) vaccine.** / Consult your health care provider. Ages 58 to 37  Blood pressure check.** / Every year.  Lipid and cholesterol check.** / Every 5 years beginning at age 66.  Lung cancer screening. / Every year if you are aged 96-80 years and have a 30-pack-year history of smoking and currently smoke or have quit within the past 15 years. Yearly screening is stopped once you have quit smoking for at least 15 years or develop a health problem that would prevent you from having lung cancer treatment.  Fecal occult blood test (FOBT) of stool. / Every year beginning at age 26 and continuing until age 47. You may not have to do this test if you get a colonoscopy every 10 years.  Flexible sigmoidoscopy** or colonoscopy.** / Every 5 years for a flexible sigmoidoscopy or every 10 years for a colonoscopy beginning at age 51 and continuing until age 71.  Hepatitis C blood test.** / For all people born from [birth year] through 1965 and any individual with known risks for hepatitis C.  Skin self-exam. / Monthly.  Influenza vaccine. / Every year.  Tetanus, diphtheria, and acellular pertussis (Tdap/Td) vaccine.** / Consult your health care provider. 1 dose of Td every 10 years.  Varicella vaccine.** / Consult your health care provider.  Zoster vaccine.** / 1 dose for adults aged 4 years or older.  Measles, mumps, rubella (MMR) vaccine.** / You need at least 1 dose of MMR if you were born in 1957 or later. You may also need a second dose.  Pneumococcal 13-valent conjugate (PCV13) vaccine.** / Consult your health care provider.  Pneumococcal polysaccharide (PPSV23) vaccine.** / 1 to 2 doses if you smoke cigarettes or if you have certain conditions.  Meningococcal vaccine.** / Consult your health care provider.  Hepatitis A vaccine.** / Consult your health care provider.  Hepatitis B vaccine.** / Consult your health care provider.  Haemophilus influenzae  type b (Hib) vaccine.** / Consult your health care provider. Ages 12 and over  Blood pressure check.** / Every year.  Lipid and cholesterol check.**/ Every 5 years beginning at age 38.  Lung cancer screening. / Every year if you are aged 76-80 years and have a 30-pack-year history of smoking and currently smoke or have quit within the past 15 years. Yearly screening is stopped once you have quit smoking for at least 15 years or develop a health problem that would prevent you from having lung cancer treatment.  Fecal occult blood test (FOBT) of stool. / Every year beginning at age 3 and continuing until age 40. You may not have to do this test if you get a colonoscopy every 10 years.  Flexible sigmoidoscopy** or colonoscopy.** / Every 5 years for a flexible sigmoidoscopy or every 10 years for a colonoscopy beginning at age 77 and continuing until age 57.  Hepatitis C blood test.** / For all people born from [birth year] through 1965 and any individual with known risks for hepatitis C.  Abdominal aortic aneurysm (AAA) screening.** / A one-time screening for ages 89 to 28 years who are current or former smokers.  Skin self-exam. / Monthly.  Influenza vaccine. / Every year.  Tetanus, diphtheria, and acellular pertussis (Tdap/Td) vaccine.** / 1 dose of Td every 10 years.  Varicella vaccine.** / Consult your health care provider.  Zoster vaccine.** / 1 dose for adults aged 67 years or older.  Pneumococcal 13-valent conjugate (PCV13) vaccine.** / 1 dose for all adults aged 18 years and older.  Pneumococcal polysaccharide (PPSV23) vaccine.** / 1 dose for all adults aged 104 years and older.  Meningococcal vaccine.** / Consult your health care provider.  Hepatitis A vaccine.** / Consult your health care provider.  Hepatitis  B vaccine.** / Consult your health care provider.  Haemophilus influenzae type b (Hib) vaccine.** / Consult your health care provider. **Family history and personal history  of risk and conditions may change your health care provider's recommendations.   This information is not intended to replace advice given to you by your health care provider. Make sure you discuss any questions you have with your health care provider.   Document Released: 08/26/2001 Document Revised: 07/21/2014 Document Reviewed: 11/25/2010 Elsevier Interactive Patient Education [employer].

## 2016-05-14 NOTE — Progress Notes (Signed)
Patient presents to clinic today for annual exam.  Patient is fasting for labs.  Acute Concerns: Patient seen in the ER yesterday with c/o flank pain and hematuria. CT revealed R 10 mm ureterolithiasis. Started on Flomax and given pain medication. Referred urgently to Urology. Patient states he was assessed by Urology Edwena Blow) yesterday and is pending recommendations from Hematology/Oncology before proceeding with intervention. Pain is averaging around 3-4/10.  Fentanyl patch as directed with relief of pain. No new or worsening symptoms.  Patient also endorses aphthous ulcer of buccal mucosa, first noted 4-5 days ago. Is painful when irritated.   Chronic Issues: Multiple Myeloma -- Followed by Oncology Marin Olp). Endorses attending infusions as scheduled. Taking chronic medications as directed. Tolerating treatments well at present.   Health Maintenance: Immunizations -- Most are up-to-date. Endorses tetanus in 2009.We are refraining from live vaccines at present due to multiple myeloma with active treatments. Shingles postponed.  Colonoscopy -- up-to-date  Past Medical History:  Diagnosis Date  . Anxiety   . Depression   . Headache    mild at times  . History of chicken pox   . Measles   . Multiple myeloma ([place]) 02/22/2016  . Myeloma ([place]) 2017    Past Surgical History:  Procedure Laterality Date  . COLONOSCOPY  [id number]  . INGUINAL HERNIA REPAIR  12/16/13   BIH by Dr. Sudeep Boston  . IR GENERIC HISTORICAL  02/11/2016   IR RADIOLOGIST EVAL & MGMT 02/11/2016 [facility]  . IR GENERIC HISTORICAL  02/15/2016   IR BONE TUMOR(S)RF ABLATION 02/15/2016 Luanne Bras, MD [facility]  . IR GENERIC HISTORICAL  02/15/2016   IR BONE TUMOR(S)RF ABLATION 02/15/2016 Luanne Bras, MD [facility]  . IR GENERIC HISTORICAL  02/15/2016   IR BONE TUMOR(S)RF ABLATION 02/15/2016 Luanne Bras, MD [facility]  . IR GENERIC HISTORICAL  02/15/2016   IR KYPHO THORACIC WITH BONE BIOPSY 02/15/2016  Luanne Bras, MD [facility]  . IR GENERIC HISTORICAL  02/15/2016   IR KYPHO THORACIC WITH BONE BIOPSY 02/15/2016 Luanne Bras, MD [facility]  . IR GENERIC HISTORICAL  02/15/2016   IR VERTEBROPLASTY CERV/THOR BX INC UNI/BIL INC/INJECT/IMAGING 02/15/2016 Luanne Bras, MD [facility]  . IR GENERIC HISTORICAL  03/13/2016   IR KYPHO EA ADDL LEVEL THORACIC OR LUMBAR 03/13/2016 Luanne Bras, MD [facility]  . IR GENERIC HISTORICAL  03/13/2016   IR KYPHO EA ADDL LEVEL THORACIC OR LUMBAR 03/13/2016 Luanne Bras, MD [facility]  . IR GENERIC HISTORICAL  03/13/2016   IR BONE TUMOR(S)RF ABLATION 03/13/2016 Luanne Bras, MD [facility]  . IR GENERIC HISTORICAL  03/13/2016   IR KYPHO LUMBAR INC FX REDUCE BONE BX UNI/BIL CANNULATION INC/IMAGING 03/13/2016 Luanne Bras, MD [facility]  . IR GENERIC HISTORICAL  03/13/2016   IR BONE TUMOR(S)RF ABLATION 03/13/2016 Luanne Bras, MD [facility]  . IR GENERIC HISTORICAL  03/13/2016   IR BONE TUMOR(S)RF ABLATION 03/13/2016 Luanne Bras, MD [facility]  . IR GENERIC HISTORICAL  03/31/2016   IR RADIOLOGIST EVAL & MGMT 03/31/2016 [facility]  . RADIOLOGY WITH ANESTHESIA N/A 02/15/2016   Procedure: Spinal Ablation;  Surgeon: Luanne Bras, MD;  Location: [facility];  Service: Radiology;  Laterality: N/A;  . RADIOLOGY WITH ANESTHESIA N/A 03/13/2016   Procedure: LUMBER ABLATION;  Surgeon: Luanne Bras, MD;  Location: [facility];  Service: Radiology;  Laterality: N/A;  . ROTATOR CUFF REPAIR  2003  . TONSILLECTOMY    . WISDOM TOOTH EXTRACTION      Current  Outpatient Prescriptions on File Prior to Visit  Medication Sig Dispense Refill  . acetaminophen (TYLENOL) 500 MG tablet Take 500 mg by mouth every 6 (six) hours as needed.    . carisoprodol (SOMA) 350 MG tablet Take 1 tablet (350 mg total) by mouth 4 (four) times daily as needed for muscle spasms. 90 tablet 3  . Cholecalciferol (VITAMIN D PO) Take 5,000 Units by mouth  daily.     [name] dexamethasone (DECADRON) 4 MG tablet Take 5 pills at one time with food every week 60 tablet 4  . famciclovir (FAMVIR) 500 MG tablet Take 1 tablet (500 mg total) by mouth daily. 30 tablet 8  . fentaNYL (DURAGESIC - DOSED MCG/HR) 50 MCG/HR Place 1 patch (50 mcg total) onto the skin every other day. 15 patch 0  . HYDROcodone-acetaminophen (NORCO) 10-325 MG tablet Take 0.5-1 tablets by mouth every 8 (eight) hours as needed for moderate pain. 60 tablet 0  . ibuprofen (ADVIL,MOTRIN) 400 MG tablet Take 400 mg by mouth every 6 (six) hours as needed for mild pain.     [name] lenalidomide (REVLIMID) 25 MG capsule Take 1 capsule daily for 21 days on rthen 7 days off. Auth #[id number] 21 capsule 0  . lidocaine-prilocaine (EMLA) cream Apply to affected area once 30 g 3  . LORazepam (ATIVAN) 0.5 MG tablet Take 1 tablet (0.5 mg total) by mouth every 6 (six) hours as needed (Nausea or vomiting). 30 tablet 0  . magnesium oxide (MAG-OX) 400 MG tablet Take 400 mg by mouth daily.    . ondansetron (ZOFRAN) 8 MG tablet Take 1 tablet (8 mg total) by mouth 2 (two) times daily as needed (Nausea or vomiting). 30 tablet 1  . polyethylene glycol powder (MIRALAX) powder Take 17 g by mouth daily. 850 g 3  . prochlorperazine (COMPAZINE) 10 MG tablet Take 1 tablet (10 mg total) by mouth every 6 (six) hours as needed for nausea or vomiting. 30 tablet 3  . rivaroxaban (XARELTO) 20 MG TABS tablet Take 1 tablet (20 mg total) by mouth daily with supper. 30 tablet 4  . senna (SENOKOT) 8.6 MG TABS tablet Take 2 tablets (17.2 mg total) by mouth daily. 60 each 2  . sertraline (ZOLOFT) 100 MG tablet Take 100 mg by mouth daily.  2  . zolpidem (AMBIEN) 10 MG tablet Take 1 tablet (10 mg total) by mouth at bedtime as needed. 30 tablet 3   No current facility-administered medications on file prior to visit.     No Known Allergies  Family History  Problem Relation Age of Onset  . Cancer Mother   . Heart disease Father   .  Hypertension Father   . Multiple sclerosis Sister   . Paranoid behavior Brother   . Drug abuse Brother   . Schizophrenia Brother   . Stroke Maternal Grandfather   . Cancer Maternal Aunt   . Leukemia Paternal Aunt   . Healthy Son     x1  . Healthy Daughter     x2  . Allergies Daughter     x1  . Diabetes Neg Hx   . Alzheimer's disease Neg Hx   . Parkinson's disease Neg Hx     Social History   Social History  . Marital status: Married    Spouse name: N/A  . Number of children: N/A  . Years of education: N/A   Occupational History  . Not on file.   Social History Main Topics  . Smoking status: Former  Smoker    Quit date: 11/02/1980  . Smokeless tobacco: Never Used  . Alcohol use Yes     Comment: 1 drink a day  . Drug use: No  . Sexual activity: Not on file   Other Topics Concern  . Not on file   Social History Narrative  . No narrative on file    Review of Systems  Constitutional: Negative for fever and weight loss.  HENT: Negative for ear discharge, ear pain, hearing loss and tinnitus.   Eyes: Negative for blurred vision, double vision, photophobia and pain.  Respiratory: Negative for cough and shortness of breath.   Cardiovascular: Negative for chest pain and palpitations.  Gastrointestinal: Negative for abdominal pain, blood in stool, constipation, diarrhea, heartburn, melena, nausea and vomiting.  Genitourinary: Positive for flank pain. Negative for dysuria, frequency, hematuria and urgency.  Musculoskeletal: Negative for falls.  Neurological: Negative for dizziness, loss of consciousness and headaches.  Endo/Heme/Allergies: Negative for environmental allergies.  Psychiatric/Behavioral: Negative for depression, hallucinations, substance abuse and suicidal ideas. The patient is not nervous/anxious and does not have insomnia.     BP 112/84   Pulse 81   Temp 98.2 F (36.8 C) (Oral)   Ht '5\' 10"'$  (1.778 m)   Wt 179 lb (81.2 kg)   SpO2 96%   BMI 25.68 kg/m     Physical Exam  Constitutional: He is oriented to person, place, and time and well-developed, well-nourished, and in no distress.  HENT:  Head: Normocephalic and atraumatic.  Right Ear: External ear normal.  Left Ear: External ear normal.  Nose: Nose normal.  Mouth/Throat: Oropharynx is clear and moist. No oropharyngeal exudate.    Eyes: Conjunctivae and EOM are normal. Pupils are equal, round, and reactive to light.  Neck: Neck supple. No thyromegaly present.  Cardiovascular: Normal rate, regular rhythm, normal heart sounds and intact distal pulses.   Pulmonary/Chest: Effort normal and breath sounds normal. No respiratory distress. He has no wheezes. He has no rales. He exhibits no tenderness.  Abdominal: Soft. Bowel sounds are normal. He exhibits no distension and no mass. There is no tenderness. There is no rebound and no guarding.  Genitourinary: Testes/scrotum normal.  Lymphadenopathy:    He has no cervical adenopathy.  Neurological: He is alert and oriented to person, place, and time.  Skin: Skin is warm and dry. No rash noted.  Psychiatric: Affect normal.  Vitals reviewed.   Recent Results (from the past 2160 hour(s))  Glucose, capillary     Status: None   Collection Time: 02/18/16 12:58 PM  Result Value Ref Range   Glucose-Capillary 98 65 - 99 mg/dL  CBC w/Diff     Status: Abnormal   Collection Time: 02/22/16  8:04 AM  Result Value Ref Range   WBC 5.4 4.0 - 10.0 10e3/uL   RBC 3.78 (L) 4.20 - 5.70 10e6/uL   HGB 13.3 13.0 - 17.1 g/dL   HCT 39.2 38.7 - 49.9 %   MCV 104 (H) 82 - 98 fL   MCH 35.2 (H) 28.0 - 33.4 pg   MCHC 33.9 32.0 - 35.9 g/dL   RDW 14.1 11.1 - 15.7 %   Platelets 229 145 - 400 10e3/uL   NEUT# 3.1 1.5 - 6.5 10e3/uL   LYMPH# 1.9 0.9 - 3.3 10e3/uL   MONO# 0.3 0.1 - 0.9 10e3/uL   Eosinophils Absolute 0.1 0.0 - 0.5 10e3/uL   BASO# 0.0 0.0 - 0.2 10e3/uL   NEUT% 56.4 40.0 - 80.0 %   LYMPH%  34.3 14.0 - 48.0 %   MONO% 6.3 0.0 - 13.0 %   EOS% 2.6 0.0 -  7.0 %   BASO% 0.4 0.0 - 2.0 %  Comprehensive metabolic panel     Status: Abnormal   Collection Time: 02/22/16  8:04 AM  Result Value Ref Range   Sodium 136 136 - 145 mEq/L   Potassium 3.8 3.5 - 5.1 mEq/L   Chloride 107 98 - 109 mEq/L   CO2 22 22 - 29 mEq/L   Glucose 107 70 - 140 mg/dl    Comment: Glucose reference range is for nonfasting patients. Fasting glucose reference range is 70- 100.   BUN 16.4 7.0 - 26.0 mg/dL   Creatinine 1.0 0.7 - 1.3 mg/dL   Total Bilirubin 0.41 0.20 - 1.20 mg/dL   Alkaline Phosphatase 95 40 - 150 U/L   AST 19 5 - 34 U/L   ALT 24 0 - 55 U/L   Total Protein 10.7 (H) 6.4 - 8.3 g/dL   Albumin 3.6 3.5 - 5.0 g/dL   Calcium 9.8 8.4 - 10.4 mg/dL   Anion Gap 7 3 - 11 mEq/L   EGFR 83 (L) >90 ml/min/1.73 m2    Comment: eGFR is calculated using the CKD-EPI Creatinine Equation (2009)  CBC w/Diff     Status: Abnormal   Collection Time: 02/27/16  8:47 AM  Result Value Ref Range   WBC 5.4 4.0 - 10.0 10e3/uL   RBC 3.60 (L) 4.20 - 5.70 10e6/uL   HGB 12.6 (L) 13.0 - 17.1 g/dL   HCT 36.7 (L) 38.7 - 49.9 %   MCV 102 (H) 82 - 98 fL   MCH 35.0 (H) 28.0 - 33.4 pg   MCHC 34.3 32.0 - 35.9 g/dL   RDW 13.6 11.1 - 15.7 %   Platelets 239 145 - 400 10e3/uL   NEUT# 3.1 1.5 - 6.5 10e3/uL   LYMPH# 1.7 0.9 - 3.3 10e3/uL   MONO# 0.4 0.1 - 0.9 10e3/uL   Eosinophils Absolute 0.1 0.0 - 0.5 10e3/uL   BASO# 0.0 0.0 - 0.2 10e3/uL   NEUT% 56.8 40.0 - 80.0 %   LYMPH% 32.2 14.0 - 48.0 %   MONO% 8.0 0.0 - 13.0 %   EOS% 2.6 0.0 - 7.0 %   BASO% 0.4 0.0 - 2.0 %  TECHNOLOGIST REVIEW [facility]     Status: None   Collection Time: 02/27/16  8:47 AM  Result Value Ref Range   Tech Review Rare myelocyte   CMP STAT     Status: Abnormal   Collection Time: 02/27/16  8:47 AM  Result Value Ref Range   Sodium 130 128 - 145 mEq/L   Potassium 4.0 3.3 - 4.7 mEq/L   Chloride 107 98 - 108 mEq/L   CO2 23 18 - 33 mEq/L   Glucose, Bld 122 (H) 73 - 118 mg/dL   BUN, Bld 17 7 - 22 mg/dL   Creat  1.1 0.6 - 1.2 mg/dl   Total Bilirubin 0.70 0.20 - 1.60 mg/dl   Alkaline Phosphatase 89 (H) 26 - 84 U/L   AST 22 11 - 38 U/L   ALT(SGPT) 28 10 - 47 U/L   Total Protein 9.8 (H) 6.4 - 8.1 g/dL   Albumin 3.4 3.3 - 5.5 g/dL   Calcium 9.5 8.0 - 10.3 mg/dL  CBC with Differential [facility])     Status: Abnormal   Collection Time: 03/05/16  8:41 AM  Result Value Ref Range   WBC 4.5  4.0 - 10.0 10e3/uL   RBC 3.87 (L) 4.20 - 5.70 10e6/uL   HGB 13.4 13.0 - 17.1 g/dL   HCT 39.0 38.7 - 49.9 %   MCV 101 (H) 82 - 98 fL   MCH 34.6 (H) 28.0 - 33.4 pg   MCHC 34.4 32.0 - 35.9 g/dL   RDW 13.4 11.1 - 15.7 %   Platelets 233 145 - 400 10e3/uL   NEUT# 2.6 1.5 - 6.5 10e3/uL   LYMPH# 1.3 0.9 - 3.3 10e3/uL   MONO# 0.3 0.1 - 0.9 10e3/uL   Eosinophils Absolute 0.3 0.0 - 0.5 10e3/uL   BASO# 0.0 0.0 - 0.2 10e3/uL   NEUT% 57.4 40.0 - 80.0 %   LYMPH% 29.6 14.0 - 48.0 %   MONO% 6.3 0.0 - 13.0 %   EOS% 6.5 0.0 - 7.0 %   BASO% 0.2 0.0 - 2.0 %  TECHNOLOGIST REVIEW [facility]     Status: None   Collection Time: 03/05/16  8:41 AM  Result Value Ref Range   Tech Review Occ meta   CMP STAT ([facility] only)     Status: Abnormal   Collection Time: 03/05/16  8:41 AM  Result Value Ref Range   Sodium 131 128 - 145 mEq/L   Potassium 4.0 3.3 - 4.7 mEq/L   Chloride 106 98 - 108 mEq/L   CO2 20 18 - 33 mEq/L   Glucose, Bld 118 73 - 118 mg/dL   BUN, Bld 13 7 - 22 mg/dL   Creat 0.8 0.6 - 1.2 mg/dl   Total Bilirubin 1.00 0.20 - 1.60 mg/dl   Alkaline Phosphatase 90 (H) 26 - 84 U/L   AST 32 11 - 38 U/L   ALT(SGPT) 37 10 - 47 U/L   Total Protein 10.2 (H) 6.4 - 8.1 g/dL   Albumin 3.9 3.3 - 5.5 g/dL   Calcium 8.7 8.0 - 10.3 mg/dL  Surgical pcr screen     Status: None   Collection Time: 03/12/16  8:47 AM  Result Value Ref Range   MRSA, PCR NEGATIVE NEGATIVE   Staphylococcus aureus NEGATIVE NEGATIVE    Comment:        The Xpert SA Assay (FDA approved for NASAL specimens in patients over 21 years  of age), is one component of a comprehensive surveillance program.  Test performance has been validated by [facility] for patients greater than or equal to 30 year old. It is not intended to diagnose infection nor to guide or monitor treatment.   Basic metabolic panel     Status: Abnormal   Collection Time: 03/12/16 10:00 AM  Result Value Ref Range   Sodium 130 (L) 135 - 145 mmol/L   Potassium 4.4 3.5 - 5.1 mmol/L   Chloride 102 101 - 111 mmol/L   CO2 20 (L) 22 - 32 mmol/L   Glucose, Bld 99 65 - 99 mg/dL   BUN 14 6 - 20 mg/dL   Creatinine, Ser 0.74 0.61 - 1.24 mg/dL   Calcium 9.3 8.9 - 10.3 mg/dL   GFR calc non Af Amer >60 >60 mL/min   GFR calc Af Amer >60 >60 mL/min    Comment: (NOTE) The eGFR has been calculated using the CKD EPI equation. This calculation has not been validated in all clinical situations. eGFR's persistently <60 mL/min signify possible Chronic Kidney Disease.    Anion gap 8 5 - 15  CBC with Differential/Platelet     Status: Abnormal   Collection Time: 03/12/16  10:00 AM  Result Value Ref Range   WBC 6.5 4.0 - 10.5 K/uL   RBC 3.83 (L) 4.22 - 5.81 MIL/uL   Hemoglobin 13.0 13.0 - 17.0 g/dL   HCT 38.4 (L) 39.0 - 52.0 %   MCV 100.3 (H) 78.0 - 100.0 fL   MCH 33.9 26.0 - 34.0 pg   MCHC 33.9 30.0 - 36.0 g/dL   RDW 13.9 11.5 - 15.5 %   Platelets 188 150 - 400 K/uL   Neutrophils Relative % 67 %   Lymphocytes Relative 23 %   Monocytes Relative 7 %   Eosinophils Relative 3 %   Basophils Relative 0 %   Neutro Abs 4.3 1.7 - 7.7 K/uL   Lymphs Abs 1.5 0.7 - 4.0 K/uL   Monocytes Absolute 0.5 0.1 - 1.0 K/uL   Eosinophils Absolute 0.2 0.0 - 0.7 K/uL   Basophils Absolute 0.0 0.0 - 0.1 K/uL  Protime-INR     Status: None   Collection Time: 03/12/16 10:00 AM  Result Value Ref Range   Prothrombin Time 14.6 11.4 - 15.2 seconds   INR 1.14   APTT     Status: None   Collection Time: 03/12/16 10:00 AM  Result Value Ref Range   aPTT 26 24 - 36 seconds  CBC w/Diff      Status: Abnormal   Collection Time: 03/21/16 10:53 AM  Result Value Ref Range   WBC 6.5 4.0 - 10.0 10e3/uL   RBC 3.88 (L) 4.20 - 5.70 10e6/uL   HGB 13.4 13.0 - 17.1 g/dL   HCT 38.5 (L) 38.7 - 49.9 %   MCV 99 (H) 82 - 98 fL   MCH 34.5 (H) 28.0 - 33.4 pg   MCHC 34.8 32.0 - 35.9 g/dL   RDW 13.6 11.1 - 15.7 %   Platelets 222 145 - 400 10e3/uL   NEUT# 4.0 1.5 - 6.5 10e3/uL   LYMPH# 1.3 0.9 - 3.3 10e3/uL   MONO# 0.8 0.1 - 0.9 10e3/uL   Eosinophils Absolute 0.3 0.0 - 0.5 10e3/uL   BASO# 0.0 0.0 - 0.2 10e3/uL   NEUT% 62.3 40.0 - 80.0 %   LYMPH% 20.7 14.0 - 48.0 %   MONO% 11.6 0.0 - 13.0 %   EOS% 5.1 0.0 - 7.0 %   BASO% 0.3 0.0 - 2.0 %  CMP STAT     Status: Abnormal   Collection Time: 03/21/16 10:53 AM  Result Value Ref Range   Sodium 134 128 - 145 mEq/L   Potassium 3.6 3.3 - 4.7 mEq/L   Chloride 103 98 - 108 mEq/L   CO2 25 18 - 33 mEq/L   Glucose, Bld 120 (H) 73 - 118 mg/dL   BUN, Bld 16 7 - 22 mg/dL   Creat 0.8 0.6 - 1.2 mg/dl   Total Bilirubin 0.90 0.20 - 1.60 mg/dl   Alkaline Phosphatase 83 26 - 84 U/L   AST 25 11 - 38 U/L   ALT(SGPT) 42 10 - 47 U/L   Total Protein 8.4 (H) 6.4 - 8.1 g/dL   Albumin 3.6 3.3 - 5.5 g/dL   Calcium 9.1 8.0 - 10.3 mg/dL  Cardiolipin antibodies, IgG, IgM, IgA     Status: None   Collection Time: 03/21/16  1:28 PM  Result Value Ref Range   Anticardiolipin Ab,IgG,Qn <9 0 - 14 GPL U/mL    Comment:  Negative:              <15                                          Indeterminate:     15 - 20                                          Low-Med Positive: >20 - 80                                          High Positive:         >80    Anticardiolipin Ab,IgM,Qn <9 0 - 12 MPL U/mL    Comment:                                          Negative:              <13                                          Indeterminate:     13 - 20                                          Low-Med Positive: >20 - 80                                           High Positive:         >80    Anticardiolipin Ab,IgA,Qn <9 0 - 11 APL U/mL    Comment:                                          Negative:              <12                                          Indeterminate:     12 - 20                                          Low-Med Positive: >20 - 80                                          High Positive:         >80  Antithrombin III     Status: Abnormal   Collection Time: 03/21/16  1:28 PM  Result Value Ref Range   Antithrombin Activity 137 (H) 75 - 135 %    Comment: An elevated antithrombin activity is of no known clinical significance. Direct thrombin inhibitor anticoagulants such as rivaroxaban, apixaban and edoxaban will lead to spuriously elevated antithrombin activity levels possibly masking a deficiency.   Beta-2-glycoprotein i abs, IgG/M/A     Status: None   Collection Time: 03/21/16  1:28 PM  Result Value Ref Range   Beta-2 Glycoprotein I Ab, IgG <9 0 - 20 GPI IgG units    Comment: The reference interval reflects a 3SD or 99th percentile interval, which is thought to represent a potentially clinically significant result in accordance with the International Consensus Statement on the classification criteria for definitive antiphospholipid syndrome (APS). J Thromb Haem 2006;4:295-306.    Beta-2 Glyco 1 IgA <9 0 - 25 GPI IgA units    Comment: The reference interval reflects a 3SD or 99th percentile interval, which is thought to represent a potentially clinically significant result in accordance with the International Consensus Statement on the classification criteria for definitive antiphospholipid syndrome (APS). J Thromb Haem 2006;4:295-306.    Beta-2 Glyco 1 IgM <9 0 - 32 GPI IgM units    Comment: The reference interval reflects a 3SD or 99th percentile interval, which is thought to represent a potentially clinically significant result in accordance with the International Consensus Statement on the  classification criteria for definitive antiphospholipid syndrome (APS). J Thromb Haem 2006;4:295-306.   Prothrombin gene mutation     Status: None   Collection Time: 03/21/16  1:28 PM  Result Value Ref Range   Factor II, DNA Analysis Comment     Comment: NEGATIVE No mutation identified. Comment: A point mutation (G20210A) in the factor II (prothrombin) gene is the second most common cause of inherited thrombophilia. The incidence of this mutation in the [place] Caucasian population is about 2% and in the [place] American population it is approximately 0.5%. This mutation is rare in the [place] and Native American population. Being heterozygous for a prothrombin mutation increases the risk for developing venous thrombosis about 2 to 3 times above the general population risk. Being homozygous for the prothrombin gene mutation increases the relative risk for venous thrombosis further, although it is not yet known how much further the risk is increased. In women heterozygous for the prothrombin gene mutation, the use of estrogen containing oral contraceptives increases the relative risk of venous thrombosis about 16 times and the risk of developing cerebral thrombosis is also significantly increased. In pregnancy the prothrombin  gene mutation increases risk for venous thrombosis and may increase risk for stillbirth, placental abruption, pre-eclampsia and fetal growth restriction. If the patient possesses two or more congenital or acquired thrombophilic risk factors, the risk for thrombosis may rise to more than the sum of the risk ratios for the individual mutations. This assay detects only the prothrombin G20210A mutation and does not measure genetic abnormalities elsewhere in the genome. Other thrombotic risk factors may be pursued through systematic clinical laboratory analysis. These factors include the R506Q (Leiden) mutation in the Factor V gene, plasma homocysteine levels, as  well as testing for deficiencies of antithrombin III, protein C and protein S.    Additional Information Comment     Comment: Genetic Counselors are available for health care providers to discuss results at [phone]). Methodology: DNA analysis of the Factor II gene was performed  by PCR amplification followed by restriction analysis. The diagnostic sensitivity is >99% for both. All the tests must be combined with clinical information for the most accurate interpretation. Molecular-based testing is highly accurate, but as in any laboratory test, diagnostic errors may occur. This test was developed and its performance characteristics determined by [facility]. It has not been cleared or approved by the Food and Drug Administration. Poort SR, et al. Blood. 1996; 90:2409-7353. Varga EA. Circulation. 2004; 299:M42-A83. Mervin Hack, et Toco; 19:700-703. Allison Quarry, PhD, [facility] Ruben Reason, PhD, [facility] Jens Som, PhD, [facility] Annetta Maw, M.S., PhD, [facility] Alfredo Bach, PhD, [facility] Norva Riffle, PhD, [facility] Earlean Polka, PhD, [facility]   Factor 5 leiden     Status: None   Collection Time: 03/21/16  1:28 PM  Result Value Ref Range   Factor V Leiden Comment     Comment: Result:  Negative (no mutation found) Factor V Leiden is a specific mutation (R506Q) in the factor V gene that is associated with an increased risk of venous thrombosis. Factor V Leiden is more resistant to inactivation by activated protein C.  As a result, factor V persists in the circulation leading to a mild hyper- coagulable state.  The Leiden mutation accounts for 90% - 95% of APC resistance.  Factor V Leiden has been reported in patients with deep vein thrombosis, pulmonary embolus, central retinal vein occlusion, cerebral sinus thrombosis and hepatic vein thrombosis. Other risk factors to be considered in the workup for venous thrombosis include  the G20210A mutation in the factor II (prothrombin) gene, protein S and C deficiency, and antithrombin deficiencies. Anticardiolipin antibody and lupus anticoagulant analysis may be appropriate for certain patients, as well as homocysteine levels. Contact your local [facility] for information on how to order additional tes ting if desired.    Comment Comment     Comment: [employer] counselors are available for health care providers to**   discuss results at [phone]). Methodology: DNA analysis of the Factor V gene was performed by allele-specific PCR. The diagnostic sensitivity and specificity is >99% for both. Molecular-based testing is highly accurate, but as in any laboratory test, diagnostic errors may occur. All test results must be combined with clinical information for the most accurate interpretation. This test was developed and its performance characteristics determined by [facility]. It has not been cleared or approved by the Food and Drug Administration. References: Voelkerding K (1996).  Clin [facility] [phone]. Allison Quarry, PhD, [facility] Ruben Reason, PhD, [facility] Jens Som, PhD, [facility] Annetta Maw, M.S., PhD, [facility] Alfredo Bach, PhD, [facility] Norva Riffle, PhD, [facility] Earlean Polka PhD, [facility]   Lupus anticoagulant panel     Status: None   Collection Time: 03/21/16  1:28 PM  Result Value Ref Range   PTT-LA 31.2 0.0 - 51.9 sec   dRVVT 39.2 0.0 - 47.0 sec   Lupus Reflex Interpretation Comment:     Comment: No lupus anticoagulant was detected.  Protein C, total     Status: None   Collection Time: 03/21/16  1:28 PM  Result Value Ref Range   Protein C Antigen 98 60 - 150 %  Protein C activity     Status: None   Collection Time: 03/21/16  1:28 PM  Result Value Ref Range   Protein C-Functional 135 73 - 180 %  Protein S activity     Status: None   Collection Time: 03/21/16  1:28 PM  Result Value Ref Range   Protein S-Functional 135 63 - 140  %    Comment: Protein S activity may be falsely increased (masking an abnormal, low result) in patients receiving direct Xa inhibitor (e.g., rivaroxaban, apixaban, edoxaban) or a direct thrombin inhibitor (e.g., dabigatran) anticoagulant treatment due to assay interference by these drugs.   Protein S, total     Status: None   Collection Time: 03/21/16  1:28 PM  Result Value Ref Range   Protein S, Total 118 60 - 150 %    Comment: This test was developed and its performance characteristics determined by [facility]. It has not been cleared or approved by the Food and Drug Administration.   CBC with Differential [facility])     Status: Abnormal   Collection Time: 03/26/16 11:24 AM  Result Value Ref Range   WBC 5.4 4.0 - 10.0 10e3/uL   RBC 4.06 (L) 4.20 - 5.70 10e6/uL   HGB 13.6 13.0 - 17.1 g/dL   HCT 39.5 38.7 - 49.9 %   MCV 97 82 - 98 fL   MCH 33.5 (H) 28.0 - 33.4 pg   MCHC 34.4 32.0 - 35.9 g/dL   RDW 12.9 11.1 - 15.7 %   Platelets 331 145 - 400 10e3/uL   NEUT# 2.7 1.5 - 6.5 10e3/uL   LYMPH# 1.8 0.9 - 3.3 10e3/uL   MONO# 0.7 0.1 - 0.9 10e3/uL   Eosinophils Absolute 0.1 0.0 - 0.5 10e3/uL   BASO# 0.0 0.0 - 0.2 10e3/uL   NEUT% 49.7 40.0 - 80.0 %   LYMPH% 33.9 14.0 - 48.0 %   MONO% 13.4 (H) 0.0 - 13.0 %   EOS% 2.4 0.0 - 7.0 %   BASO% 0.6 0.0 - 2.0 %  TECHNOLOGIST REVIEW [facility]     Status: None   Collection Time: 03/26/16 11:24 AM  Result Value Ref Range   Tech Review 3% myelocytes present   CMP STAT ([place] only)     Status: Abnormal   Collection Time: 03/26/16 11:24 AM  Result Value Ref Range   Sodium 130 128 - 145 mEq/L   Potassium 4.0 3.3 - 4.7 mEq/L   Chloride 104 98 - 108 mEq/L   CO2 22 18 - 33 mEq/L   Glucose, Bld 110 73 - 118 mg/dL   BUN, Bld 18 7 - 22 mg/dL   Creat 1.0 0.6 - 1.2 mg/dl   Total Bilirubin 0.70 0.20 - 1.60 mg/dl   Alkaline Phosphatase 98 (H) 26 - 84 U/L   AST 30 11 - 38 U/L   ALT(SGPT) 36 10 - 47 U/L   Total Protein 8.3  (H) 6.4 - 8.1 g/dL   Albumin 3.6 3.3 - 5.5 g/dL   Calcium 8.8 8.0 - 10.3 mg/dL  Kappa/lambda light chains     Status: Abnormal   Collection Time: 03/26/16  1:05 PM  Result Value Ref Range   Ig Kappa Free Light Chain 21.0 (H) 3.3 - 19.4 mg/L   Ig Lambda Free Light Chain 5.9 5.7 - 26.3 mg/L   Kappa/Lambda FluidC Ratio 3.56 (H) 0.26 - 1.65  Beta 2 microglobulin, serum     Status: None   Collection Time: 03/26/16  1:05 PM  Result Value Ref Range   Beta-2 2.3 0.6 - 2.4 mg/L  Multiple Myeloma Panel (SPEP&IFE w/QIG)     Status: Abnormal   Collection Time: 03/26/16  1:05 PM  Result Value Ref Range   IgG, Qn, Serum 2,174 (H) 700 -  1600 mg/dL   IgA, Qn, Serum 25 (L) 90 - 386 mg/dL    Comment: Result confirmed on concentration.   IgM, Qn, Serum 31 20 - 172 mg/dL   Total Protein 7.6 6.0 - 8.5 g/dL   Albumin SerPl Elph-Mcnc 3.5 2.9 - 4.4 g/dL   Alpha 1 0.3 0.0 - 0.4 g/dL   Alpha2 Glob SerPl Elph-Mcnc 0.9 0.4 - 1.0 g/dL   B-Globulin SerPl Elph-Mcnc 0.9 0.7 - 1.3 g/dL   Gamma Glob SerPl Elph-Mcnc 1.9 (H) 0.4 - 1.8 g/dL   M Protein SerPl Elph-Mcnc 1.7 (H) Not Observed g/dL   Globulin, Total 4.1 (H) 2.2 - 3.9 g/dL   Albumin/Glob SerPl 0.9 0.7 - 1.7   IFE 1 Comment     Comment: Immunofixation shows IgG monoclonal protein with kappa light chain specificity.    Please Note Comment     Comment: Protein electrophoresis scan will follow via computer, mail, or courier delivery.   Lactate dehydrogenase     Status: None   Collection Time: 03/26/16  1:06 PM  Result Value Ref Range   LDH 230 125 - 245 U/L  CMP STAT ([facility] only)     Status: Abnormal   Collection Time: 04/02/16  8:36 AM  Result Value Ref Range   Sodium 135 128 - 145 mEq/L   Potassium 4.4 3.3 - 4.7 mEq/L   Chloride 107 98 - 108 mEq/L   CO2 25 18 - 33 mEq/L   Glucose, Bld 95 73 - 118 mg/dL   BUN, Bld 13 7 - 22 mg/dL   Creat 0.8 0.6 - 1.2 mg/dl   Total Bilirubin 0.80 0.20 - 1.60 mg/dl   Alkaline Phosphatase 105  (H) 26 - 84 U/L   AST 37 11 - 38 U/L   ALT(SGPT) 48 (H) 10 - 47 U/L   Total Protein 8.3 (H) 6.4 - 8.1 g/dL   Albumin 4.1 3.3 - 5.5 g/dL   Calcium 8.8 8.0 - 10.3 mg/dL  CBC with Differential [facility])     Status: Abnormal   Collection Time: 04/02/16  8:36 AM  Result Value Ref Range   WBC 5.1 4.0 - 10.0 10e3/uL   RBC 4.12 (L) 4.20 - 5.70 10e6/uL   HGB 13.8 13.0 - 17.1 g/dL   HCT 40.4 38.7 - 49.9 %   MCV 98 82 - 98 fL   MCH 33.5 (H) 28.0 - 33.4 pg   MCHC 34.2 32.0 - 35.9 g/dL   RDW 13.5 11.1 - 15.7 %   Platelets 227 145 - 400 10e3/uL   NEUT# 2.9 1.5 - 6.5 10e3/uL   LYMPH# 1.4 0.9 - 3.3 10e3/uL   MONO# 0.5 0.1 - 0.9 10e3/uL   Eosinophils Absolute 0.3 0.0 - 0.5 10e3/uL   BASO# 0.0 0.0 - 0.2 10e3/uL   NEUT% 57.1 40.0 - 80.0 %   LYMPH% 27.2 14.0 - 48.0 %   MONO% 9.8 0.0 - 13.0 %   EOS% 5.1 0.0 - 7.0 %   BASO% 0.8 0.0 - 2.0 %  TECHNOLOGIST REVIEW [facility]     Status: None   Collection Time: 04/02/16  8:36 AM  Result Value Ref Range   Tech Review Metas and Myelocytes present   CMP STAT ([place] only)     Status: Abnormal   Collection Time: 04/09/16  8:53 AM  Result Value Ref Range   Sodium 132 128 - 145 mEq/L   Potassium 4.2 3.3 - 4.7 mEq/L  Chloride 101 98 - 108 mEq/L   CO2 26 18 - 33 mEq/L   Glucose, Bld 131 (H) 73 - 118 mg/dL   BUN, Bld 12 7 - 22 mg/dL   Creat 1.0 0.6 - 1.2 mg/dl   Total Bilirubin 1.00 0.20 - 1.60 mg/dl   Alkaline Phosphatase 96 (H) 26 - 84 U/L   AST 27 11 - 38 U/L   ALT(SGPT) 38 10 - 47 U/L   Total Protein 8.1 6.4 - 8.1 g/dL   Albumin 4.0 3.3 - 5.5 g/dL   Calcium 9.0 8.0 - 10.3 mg/dL  CBC with Differential [facility])     Status: Abnormal   Collection Time: 04/09/16  8:53 AM  Result Value Ref Range   WBC 6.8 4.0 - 10.0 10e3/uL   RBC 4.30 4.20 - 5.70 10e6/uL   HGB 14.4 13.0 - 17.1 g/dL   HCT 42.2 38.7 - 49.9 %   MCV 98 82 - 98 fL   MCH 33.5 (H) 28.0 - 33.4 pg   MCHC 34.1 32.0 - 35.9 g/dL   RDW 13.5 11.1 - 15.7 %    Platelets 194 145 - 400 10e3/uL   NEUT# 4.5 1.5 - 6.5 10e3/uL   LYMPH# 1.5 0.9 - 3.3 10e3/uL   MONO# 0.6 0.1 - 0.9 10e3/uL   Eosinophils Absolute 0.2 0.0 - 0.5 10e3/uL   BASO# 0.0 0.0 - 0.2 10e3/uL   NEUT% 66.1 40.0 - 80.0 %   LYMPH% 21.8 14.0 - 48.0 %   MONO% 8.6 0.0 - 13.0 %   EOS% 3.2 0.0 - 7.0 %   BASO% 0.3 0.0 - 2.0 %  CBC with Differential [facility])     Status: Abnormal   Collection Time: 04/23/16  9:04 AM  Result Value Ref Range   WBC 3.5 (L) 4.0 - 10.0 10e3/uL   RBC 4.16 (L) 4.20 - 5.70 10e6/uL   HGB 13.7 13.0 - 17.1 g/dL   HCT 40.4 38.7 - 49.9 %   MCV 97 82 - 98 fL   MCH 32.9 28.0 - 33.4 pg   MCHC 33.9 32.0 - 35.9 g/dL   RDW 13.7 11.1 - 15.7 %   Platelets 229 145 - 400 10e3/uL   NEUT# 1.5 1.5 - 6.5 10e3/uL   LYMPH# 1.3 0.9 - 3.3 10e3/uL   MONO# 0.4 0.1 - 0.9 10e3/uL   Eosinophils Absolute 0.2 0.0 - 0.5 10e3/uL   BASO# 0.0 0.0 - 0.2 10e3/uL   NEUT% 43.9 40.0 - 80.0 %   LYMPH% 37.4 14.0 - 48.0 %   MONO% 12.6 0.0 - 13.0 %   EOS% 5.5 0.0 - 7.0 %   BASO% 0.6 0.0 - 2.0 %  CMP STAT ([facility] only)     Status: Abnormal   Collection Time: 04/23/16  9:04 AM  Result Value Ref Range   Sodium 132 128 - 145 mEq/L   Potassium 4.1 3.3 - 4.7 mEq/L   Chloride 98 98 - 108 mEq/L   CO2 23 18 - 33 mEq/L   Glucose, Bld 139 (H) 73 - 118 mg/dL   BUN, Bld 15 7 - 22 mg/dL   Creat 0.8 0.6 - 1.2 mg/dl   Total Bilirubin 0.60 0.20 - 1.60 mg/dl   Alkaline Phosphatase 63 26 - 84 U/L   AST 30 11 - 38 U/L   ALT(SGPT) 41 10 - 47 U/L   Total Protein 6.8 6.4 - 8.1 g/dL   Albumin 3.8 3.3 - 5.5 g/dL  Calcium 8.5 8.0 - 10.3 mg/dL  IgG, IgA, IgM     Status: Abnormal   Collection Time: 04/23/16  9:04 AM  Result Value Ref Range   IgG, Qn, Serum 1,101 700 - 1600 mg/dL   IgA, Qn, Serum 22 (L) 90 - 386 mg/dL    Comment: Result confirmed on concentration.   IgM, Qn, Serum 20 20 - 172 mg/dL    Comment: Result confirmed on concentration.  Kappa/lambda light chains      Status: Abnormal   Collection Time: 04/23/16  9:04 AM  Result Value Ref Range   Ig Kappa Free Light Chain 21.6 (H) 3.3 - 19.4 mg/L   Ig Lambda Free Light Chain 7.6 5.7 - 26.3 mg/L   Kappa/Lambda FluidC Ratio 2.84 (H) 0.26 - 1.65  Lactate dehydrogenase     Status: None   Collection Time: 04/23/16  9:04 AM  Result Value Ref Range   LDH 159 125 - 245 U/L  Serum protein electrophoresis with reflex     Status: Abnormal   Collection Time: 04/23/16  9:04 AM  Result Value Ref Range   Total Protein 6.6 6.0 - 8.5 g/dL   Albumin 3.8 2.9 - 4.4 g/dL   Alpha 1 0.2 0.0 - 0.4 g/dL   Alpha 2 0.6 0.4 - 1.0 g/dL   Beta 0.8 0.7 - 1.3 g/dL   Gamma Globulin 1.1 0.4 - 1.8 g/dL   M-Spike, % 0.9 (H) Not Observed g/dL   GLOBULIN, TOTAL 2.8 2.2 - 3.9 g/dL   A/G Ratio 1.4 0.7 - 1.7   Please Note: Comment     Comment: Protein electrophoresis scan will follow via computer, mail, or courier delivery.    Interpretation(See Below) .    IFE 1 Comment     Comment: Immunofixation shows IgG monoclonal protein with kappa light chain specificity.    PDF .   LDH     Status: None   Collection Time: 04/30/16  8:36 AM  Result Value Ref Range   LDH 182 125 - 245 U/L  Multiple Myeloma Panel (SPEP&IFE w/QIG)     Status: Abnormal   Collection Time: 04/30/16  8:36 AM  Result Value Ref Range   IgG, Qn, Serum 1,105 700 - 1600 mg/dL   IgA, Qn, Serum 22 (L) 90 - 386 mg/dL    Comment: Result confirmed on concentration.   IgM, Qn, Serum 19 (L) 20 - 172 mg/dL    Comment: Result confirmed on concentration.   Total Protein 6.9 6.0 - 8.5 g/dL   Albumin SerPl Elph-Mcnc 3.8 2.9 - 4.4 g/dL   Alpha 1 0.3 0.0 - 0.4 g/dL   Alpha2 Glob SerPl Elph-Mcnc 0.8 0.4 - 1.0 g/dL   B-Globulin SerPl Elph-Mcnc 0.9 0.7 - 1.3 g/dL   Gamma Glob SerPl Elph-Mcnc 1.1 0.4 - 1.8 g/dL   M Protein SerPl Elph-Mcnc 0.9 (H) Not Observed g/dL   Globulin, Total 3.1 2.2 - 3.9 g/dL   Albumin/Glob SerPl 1.3 0.7 - 1.7   IFE 1 Comment     Comment:  Immunofixation shows IgG monoclonal protein with kappa light chain specificity.    Please Note Comment     Comment: Protein electrophoresis scan will follow via computer, mail, or courier delivery.   Kappa/lambda light chains     Status: Abnormal   Collection Time: 04/30/16  8:36 AM  Result Value Ref Range   Ig Kappa Free Light Chain 20.7 (H) 3.3 - 19.4 mg/L   Ig Lambda Free Light Chain 7.5  5.7 - 26.3 mg/L   Kappa/Lambda FluidC Ratio 2.76 (H) 0.26 - 1.65  CBC with Differential [facility])     Status: Abnormal   Collection Time: 04/30/16  8:36 AM  Result Value Ref Range   WBC 3.7 (L) 4.0 - 10.0 10e3/uL   RBC 3.94 (L) 4.20 - 5.70 10e6/uL   HGB 13.0 13.0 - 17.1 g/dL   HCT 18.7 (L) 48.9 - 73.5 %   MCV 98 82 - 98 fL   MCH 33.0 28.0 - 33.4 pg   MCHC 33.9 32.0 - 35.9 g/dL   RDW 72.4 24.2 - 44.4 %   Platelets 184 145 - 400 10e3/uL   NEUT# 1.7 1.5 - 6.5 10e3/uL   LYMPH# 1.1 0.9 - 3.3 10e3/uL   MONO# 0.6 0.1 - 0.9 10e3/uL   Eosinophils Absolute 0.2 0.0 - 0.5 10e3/uL   BASO# 0.0 0.0 - 0.2 10e3/uL   NEUT% 46.6 40.0 - 80.0 %   LYMPH% 29.3 14.0 - 48.0 %   MONO% 17.1 (H) 0.0 - 13.0 %   EOS% 6.2 0.0 - 7.0 %   BASO% 0.8 0.0 - 2.0 %  CMP STAT ([facility] only)     Status: None   Collection Time: 04/30/16  8:36 AM  Result Value Ref Range   Sodium 139 128 - 145 mEq/L   Potassium 4.5 3.3 - 4.7 mEq/L   Chloride 101 98 - 108 mEq/L   CO2 27 18 - 33 mEq/L   Glucose, Bld 86 73 - 118 mg/dL   BUN, Bld 13 7 - 22 mg/dL   Creat 0.8 0.6 - 1.2 mg/dl   Total Bilirubin 5.36 0.20 - 1.60 mg/dl   Alkaline Phosphatase 73 26 - 84 U/L   AST 29 11 - 38 U/L   ALT(SGPT) 35 10 - 47 U/L   Total Protein 7.0 6.4 - 8.1 g/dL   Albumin 3.9 3.3 - 5.5 g/dL   Calcium 9.2 8.0 - 76.1 mg/dL  TECHNOLOGIST REVIEW [facility]     Status: None   Collection Time: 04/30/16  8:36 AM  Result Value Ref Range   Tech Review Rare myelocyte   CBC with Differential [facility])     Status: Abnormal    Collection Time: 05/07/16 10:03 AM  Result Value Ref Range   WBC 7.3 4.0 - 10.0 10e3/uL   RBC 4.01 (L) 4.20 - 5.70 10e6/uL   HGB 13.2 13.0 - 17.1 g/dL   HCT 14.1 12.5 - 93.2 %   MCV 98 82 - 98 fL   MCH 32.9 28.0 - 33.4 pg   MCHC 33.6 32.0 - 35.9 g/dL   RDW 72.8 88.9 - 90.8 %   Platelets 222 145 - 400 10e3/uL   NEUT# 5.6 1.5 - 6.5 10e3/uL   LYMPH# 0.9 0.9 - 3.3 10e3/uL   MONO# 0.6 0.1 - 0.9 10e3/uL   Eosinophils Absolute 0.1 0.0 - 0.5 10e3/uL   BASO# 0.0 0.0 - 0.2 10e3/uL   NEUT% 77.1 40.0 - 80.0 %   LYMPH% 12.9 (L) 14.0 - 48.0 %   MONO% 8.0 0.0 - 13.0 %   EOS% 1.9 0.0 - 7.0 %   BASO% 0.1 0.0 - 2.0 %  CMP STAT ([facility] only)     Status: Abnormal   Collection Time: 05/07/16 10:03 AM  Result Value Ref Range   Sodium 138 128 - 145 mEq/L   Potassium 4.2 3.3 - 4.7 mEq/L   Chloride 103 98 - 108  mEq/L   CO2 26 18 - 33 mEq/L   Glucose, Bld 105 73 - 118 mg/dL   BUN, Bld 12 7 - 22 mg/dL   Creat 1.0 0.6 - 1.2 mg/dl   Total Bilirubin 0.60 0.20 - 1.60 mg/dl   Alkaline Phosphatase 64 26 - 84 U/L   AST 37 11 - 38 U/L   ALT(SGPT) 51 (H) 10 - 47 U/L   Total Protein 6.6 6.4 - 8.1 g/dL   Albumin 3.7 3.3 - 5.5 g/dL   Calcium 9.0 8.0 - 10.3 mg/dL  Urinalysis, Routine w reflex microscopic (not at [facility])     Status: Abnormal   Collection Time: 05/12/16  6:25 AM  Result Value Ref Range   Color, Urine AMBER (A) YELLOW    Comment: BIOCHEMICALS MAY BE AFFECTED BY COLOR   APPearance CLOUDY (A) CLEAR   Specific Gravity, Urine 1.015 1.005 - 1.030   pH 6.0 5.0 - 8.0   Glucose, UA NEGATIVE NEGATIVE mg/dL   Hgb urine dipstick LARGE (A) NEGATIVE   Bilirubin Urine NEGATIVE NEGATIVE   Ketones, ur NEGATIVE NEGATIVE mg/dL   Protein, ur 30 (A) NEGATIVE mg/dL   Nitrite NEGATIVE NEGATIVE   Leukocytes, UA NEGATIVE NEGATIVE  Urine microscopic-add on     Status: Abnormal   Collection Time: 05/12/16  6:25 AM  Result Value Ref Range   Squamous Epithelial / LPF 0-5 (A) NONE SEEN   WBC, UA  6-30 0 - 5 WBC/hpf   RBC / HPF TOO NUMEROUS TO COUNT 0 - 5 RBC/hpf   Bacteria, UA NONE SEEN NONE SEEN   Urine-Other URINALYSIS PERFORMED ON SUPERNATANT   Lipid panel     Status: Abnormal   Collection Time: 05/14/16  2:13 PM  Result Value Ref Range   Cholesterol 210 (H) 0 - 200 mg/dL    Comment: ATP III Classification       Desirable:  < 200 mg/dL               Borderline High:  200 - 239 mg/dL          High:  > = 240 mg/dL   Triglycerides 133.0 0.0 - 149.0 mg/dL    Comment: Normal:  <150 mg/dLBorderline High:  150 - 199 mg/dL   HDL 59.00 >39.00 mg/dL   VLDL 26.6 0.0 - 40.0 mg/dL   LDL Cholesterol 124 (H) 0 - 99 mg/dL   Total CHOL/HDL Ratio 4     Comment:                Men          Women1/2 Average Risk     3.4          3.3Average Risk          5.0          4.42X Average Risk          9.6          7.13X Average Risk          15.0          11.0                       NonHDL 150.79     Comment: NOTE:  Non-HDL goal should be 30 mg/dL higher than patient's LDL goal (i.e. LDL goal of < 70 mg/dL, would have non-HDL goal of < 100 mg/dL)  PSA     Status: None   Collection Time:  05/14/16  2:13 PM  Result Value Ref Range   PSA 0.68 0.10 - 4.00 ng/mL  Hemoglobin A1c     Status: None   Collection Time: 05/14/16  2:13 PM  Result Value Ref Range   Hgb A1c MFr Bld 5.1 4.6 - 6.5 %    Comment: Glycemic Control Guidelines for People with Diabetes:Non Diabetic:  <6%Goal of Therapy: <7%Additional Action Suggested:  >8%     Assessment/Plan: Visit for preventive health examination Depression screen negative. Health Maintenance reviewed -- Immunizations reviewed. Shingles deferred due to MM. Preventive schedule discussed and handout given in AVS. Will obtain fasting labs today.   Ureterolithiasis Followed by Urology. Continue care discussed by specialist.   Multiple myeloma not having achieved remission ([place]) Followed by Oncology. Doing well with treatment. Continue same.  Aphthous ulcer Rx  Kenalog in [facility]. Peroxyl OTC recommended. Supportive measures reviewed.     Leeanne Rio, PA-C

## 2016-05-16 ENCOUNTER — Other Ambulatory Visit: Payer: Self-pay | Admitting: Urology

## 2016-05-16 DIAGNOSIS — N201 Calculus of ureter: Secondary | ICD-10-CM | POA: Insufficient documentation

## 2016-05-16 DIAGNOSIS — K120 Recurrent oral aphthae: Secondary | ICD-10-CM | POA: Insufficient documentation

## 2016-05-16 DIAGNOSIS — Z0000 Encounter for general adult medical examination without abnormal findings: Secondary | ICD-10-CM | POA: Insufficient documentation

## 2016-05-16 NOTE — Progress Notes (Deleted)
Need orders for 11-17 surgery in epic

## 2016-05-16 NOTE — Assessment & Plan Note (Signed)
Followed by Oncology. Doing well with treatment. Continue same.

## 2016-05-16 NOTE — Assessment & Plan Note (Signed)
Followed by Urology. Continue care discussed by specialist.

## 2016-05-16 NOTE — Assessment & Plan Note (Signed)
Depression screen negative. Health Maintenance reviewed -- Immunizations reviewed. Shingles deferred due to MM. Preventive schedule discussed and handout given in AVS. Will obtain fasting labs today.

## 2016-05-16 NOTE — Assessment & Plan Note (Signed)
Rx Kenalog in [facility]. Peroxyl OTC recommended. Supportive measures reviewed.

## 2016-05-19 ENCOUNTER — Encounter: Payer: Self-pay | Admitting: Nurse Practitioner

## 2016-05-19 ENCOUNTER — Encounter (HOSPITAL_COMMUNITY): Payer: Self-pay | Admitting: *Deleted

## 2016-05-19 DIAGNOSIS — J22 Unspecified acute lower respiratory infection: Secondary | ICD-10-CM

## 2016-05-19 HISTORY — DX: Unspecified acute lower respiratory infection: J22

## 2016-05-20 ENCOUNTER — Other Ambulatory Visit: Payer: Self-pay | Admitting: *Deleted

## 2016-05-20 DIAGNOSIS — C9000 Multiple myeloma not having achieved remission: Secondary | ICD-10-CM

## 2016-05-21 ENCOUNTER — Ambulatory Visit (HOSPITAL_BASED_OUTPATIENT_CLINIC_OR_DEPARTMENT_OTHER): Payer: 59

## 2016-05-21 ENCOUNTER — Other Ambulatory Visit: Payer: Self-pay | Admitting: Family

## 2016-05-21 ENCOUNTER — Other Ambulatory Visit (HOSPITAL_BASED_OUTPATIENT_CLINIC_OR_DEPARTMENT_OTHER): Payer: 59

## 2016-05-21 VITALS — BP 116/66 | HR 72 | Temp 98.4°F | Resp 18

## 2016-05-21 DIAGNOSIS — C9000 Multiple myeloma not having achieved remission: Secondary | ICD-10-CM | POA: Diagnosis not present

## 2016-05-21 DIAGNOSIS — Z5112 Encounter for antineoplastic immunotherapy: Secondary | ICD-10-CM

## 2016-05-21 LAB — CBC WITH DIFFERENTIAL (CANCER CENTER ONLY)
BASO#: 0 10*3/uL (ref 0.0–0.2)
BASO%: 0.2 % (ref 0.0–2.0)
EOS%: 0 % (ref 0.0–7.0)
Eosinophils Absolute: 0 10*3/uL (ref 0.0–0.5)
HCT: 36.7 % — ABNORMAL LOW (ref 38.7–49.9)
HEMOGLOBIN: 12.6 g/dL — AB (ref 13.0–17.1)
LYMPH#: 0.4 10*3/uL — AB (ref 0.9–3.3)
LYMPH%: 10 % — AB (ref 14.0–48.0)
MCH: 33.3 pg (ref 28.0–33.4)
MCHC: 34.3 g/dL (ref 32.0–35.9)
MCV: 97 fL (ref 82–98)
MONO#: 0.1 10*3/uL (ref 0.1–0.9)
MONO%: 1.9 % (ref 0.0–13.0)
NEUT%: 87.9 % — ABNORMAL HIGH (ref 40.0–80.0)
NEUTROS ABS: 3.8 10*3/uL (ref 1.5–6.5)
PLATELETS: 246 10*3/uL (ref 145–400)
RBC: 3.78 10*6/uL — AB (ref 4.20–5.70)
RDW: 14.9 % (ref 11.1–15.7)
WBC: 4.3 10*3/uL (ref 4.0–10.0)

## 2016-05-21 LAB — CMP (CANCER CENTER ONLY)
ALK PHOS: 64 U/L (ref 26–84)
ALT(SGPT): 43 U/L (ref 10–47)
AST: 37 U/L (ref 11–38)
Albumin: 4.2 g/dL (ref 3.3–5.5)
BUN: 11 mg/dL (ref 7–22)
CHLORIDE: 102 meq/L (ref 98–108)
CO2: 24 meq/L (ref 18–33)
Calcium: 8.9 mg/dL (ref 8.0–10.3)
Creat: 0.9 mg/dl (ref 0.6–1.2)
GLUCOSE: 161 mg/dL — AB (ref 73–118)
Potassium: 3.9 mEq/L (ref 3.3–4.7)
SODIUM: 136 meq/L (ref 128–145)
Total Bilirubin: 0.5 mg/dl (ref 0.20–1.60)
Total Protein: 6.9 g/dL (ref 6.4–8.1)

## 2016-05-21 MED ORDER — SODIUM CHLORIDE 0.9 % IV SOLN
Freq: Once | INTRAVENOUS | Status: AC
  Administered 2016-05-21: 15:00:00 via INTRAVENOUS

## 2016-05-21 MED ORDER — PROCHLORPERAZINE MALEATE 10 MG PO TABS
10.0000 mg | ORAL_TABLET | Freq: Once | ORAL | Status: AC
  Administered 2016-05-21: 10 mg via ORAL

## 2016-05-21 MED ORDER — ZOLEDRONIC ACID 4 MG/100ML IV SOLN
4.0000 mg | Freq: Once | INTRAVENOUS | Status: AC
  Administered 2016-05-21: 4 mg via INTRAVENOUS
  Filled 2016-05-21: qty 100

## 2016-05-21 MED ORDER — PROCHLORPERAZINE MALEATE 10 MG PO TABS
ORAL_TABLET | ORAL | Status: AC
  Filled 2016-05-21: qty 1

## 2016-05-21 MED ORDER — BORTEZOMIB CHEMO SQ INJECTION 3.5 MG (2.5MG/ML)
1.3000 mg/m2 | Freq: Once | INTRAMUSCULAR | Status: AC
  Administered 2016-05-21: 2.5 mg via SUBCUTANEOUS
  Filled 2016-05-21: qty 2.5

## 2016-05-21 NOTE — Patient Instructions (Addendum)
Bortezomib injection What is this medicine? BORTEZOMIB (bor TEZ oh mib) is a medicine that targets proteins in cancer cells and stops the cancer cells from growing. It is used to treat multiple myeloma and mantle-cell lymphoma. This medicine may be used for other purposes; ask your health care provider or pharmacist if you have questions. What should I tell my health care provider before I take this medicine? They need to know if you have any of these conditions: -diabetes -heart disease -irregular heartbeat -liver disease -on hemodialysis -low blood counts, like low white blood cells, platelets, or hemoglobin -peripheral neuropathy -taking medicine for blood pressure -an unusual or allergic reaction to bortezomib, mannitol, boron, other medicines, foods, dyes, or preservatives -pregnant or trying to get pregnant -breast-feeding How should I use this medicine? This medicine is for injection into a vein or for injection under the skin. It is given by a health care professional in a hospital or clinic setting. Talk to your pediatrician regarding the use of this medicine in children. Special care may be needed. Overdosage: If you think you have taken too much of this medicine contact a poison control center or emergency room at once. NOTE: This medicine is only for you. Do not share this medicine with others. What if I miss a dose? It is important not to miss your dose. Call your doctor or health care professional if you are unable to keep an appointment. What may interact with this medicine? This medicine may interact with the following medications: -ketoconazole -rifampin -ritonavir -St. John's Wort This list may not describe all possible interactions. Give your health care provider a list of all the medicines, herbs, non-prescription drugs, or dietary supplements you use. Also tell them if you smoke, drink alcohol, or use illegal drugs. Some items may interact with your medicine. What  should I watch for while using this medicine? Visit your doctor for checks on your progress. This drug may make you feel generally unwell. This is not uncommon, as chemotherapy can affect healthy cells as well as cancer cells. Report any side effects. Continue your course of treatment even though you feel ill unless your doctor tells you to stop. You may get drowsy or dizzy. Do not drive, use machinery, or do anything that needs mental alertness until you know how this medicine affects you. Do not stand or sit up quickly, especially if you are an older patient. This reduces the risk of dizzy or fainting spells. In some cases, you may be given additional medicines to help with side effects. Follow all directions for their use. Call your doctor or health care professional for advice if you get a fever, chills or sore throat, or other symptoms of a cold or flu. Do not treat yourself. This drug decreases your body's ability to fight infections. Try to avoid being around people who are sick. This medicine may increase your risk to bruise or bleed. Call your doctor or health care professional if you notice any unusual bleeding. You may need blood work done while you are taking this medicine. In some patients, this medicine may cause a serious brain infection that may cause death. If you have any problems seeing, thinking, speaking, walking, or standing, tell your doctor right away. If you cannot reach your doctor, urgently seek other source of medical care. Do not become pregnant while taking this medicine. Women should inform their doctor if they wish to become pregnant or think they might be pregnant. There is a potential for serious  side effects to an unborn child. Talk to your health care professional or pharmacist for more information. Do not breast-feed an infant while taking this medicine. Check with your doctor or health care professional if you get an attack of severe diarrhea, nausea and vomiting, or if  you sweat a lot. The loss of too much body fluid can make it dangerous for you to take this medicine. What side effects may I notice from receiving this medicine? Side effects that you should report to your doctor or health care professional as soon as possible: -allergic reactions like skin rash, itching or hives, swelling of the face, lips, or tongue -breathing problems -changes in hearing -changes in vision -fast, irregular heartbeat -feeling faint or lightheaded, falls -pain, tingling, numbness in the hands or feet -right upper belly pain -seizures -swelling of the ankles, feet, hands -unusual bleeding or bruising -unusually weak or tired -vomiting -yellowing of the eyes or skin Side effects that usually do not require medical attention (report to your doctor or health care professional if they continue or are bothersome): -changes in emotions or moods -constipation -diarrhea -loss of appetite -headache -irritation at site where injected -nausea This list may not describe all possible side effects. Call your doctor for medical advice about side effects. You may report side effects to FDA at 1-800-FDA-1088. Where should I keep my medicine? This drug is given in a hospital or clinic and will not be stored at home. NOTE: This sheet is a summary. It may not cover all possible information. If you have questions about this medicine, talk to your doctor, pharmacist, or health care provider.    2016, Elsevier/Gold Standard. (2014-08-29 14:47:04) Zoledronic Acid injection (Hypercalcemia, Oncology) What is this medicine? ZOLEDRONIC ACID (ZOE le dron ik AS id) lowers the amount of calcium loss from bone. It is used to treat too much calcium in your blood from cancer. It is also used to prevent complications of cancer that has spread to the bone. This medicine may be used for other purposes; ask your health care provider or pharmacist if you have questions. What should I tell my health  care provider before I take this medicine? They need to know if you have any of these conditions: -aspirin-sensitive asthma -cancer, especially if you are receiving medicines used to treat cancer -dental disease or wear dentures -infection -kidney disease -receiving corticosteroids like dexamethasone or prednisone -an unusual or allergic reaction to zoledronic acid, other medicines, foods, dyes, or preservatives -pregnant or trying to get pregnant -breast-feeding How should I use this medicine? This medicine is for infusion into a vein. It is given by a health care professional in a hospital or clinic setting. Talk to your pediatrician regarding the use of this medicine in children. Special care may be needed. Overdosage: If you think you have taken too much of this medicine contact a poison control center or emergency room at once. NOTE: This medicine is only for you. Do not share this medicine with others. What if I miss a dose? It is important not to miss your dose. Call your doctor or health care professional if you are unable to keep an appointment. What may interact with this medicine? -certain antibiotics given by injection -NSAIDs, medicines for pain and inflammation, like ibuprofen or naproxen -some diuretics like bumetanide, furosemide -teriparatide -thalidomide This list may not describe all possible interactions. Give your health care provider a list of all the medicines, herbs, non-prescription drugs, or dietary supplements you use. Also tell them   if you smoke, drink alcohol, or use illegal drugs. Some items may interact with your medicine. What should I watch for while using this medicine? Visit your doctor or health care professional for regular checkups. It may be some time before you see the benefit from this medicine. Do not stop taking your medicine unless your doctor tells you to. Your doctor may order blood tests or other tests to see how you are doing. Women should  inform their doctor if they wish to become pregnant or think they might be pregnant. There is a potential for serious side effects to an unborn child. Talk to your health care professional or pharmacist for more information. You should make sure that you get enough calcium and vitamin D while you are taking this medicine. Discuss the foods you eat and the vitamins you take with your health care professional. Some people who take this medicine have severe bone, joint, and/or muscle pain. This medicine may also increase your risk for jaw problems or a broken thigh bone. Tell your doctor right away if you have severe pain in your jaw, bones, joints, or muscles. Tell your doctor if you have any pain that does not go away or that gets worse. Tell your dentist and dental surgeon that you are taking this medicine. You should not have major dental surgery while on this medicine. See your dentist to have a dental exam and fix any dental problems before starting this medicine. Take good care of your teeth while on this medicine. Make sure you see your dentist for regular follow-up appointments. What side effects may I notice from receiving this medicine? Side effects that you should report to your doctor or health care professional as soon as possible: -allergic reactions like skin rash, itching or hives, swelling of the face, lips, or tongue -anxiety, confusion, or depression -breathing problems -changes in vision -eye pain -feeling faint or lightheaded, falls -jaw pain, especially after dental work -mouth sores -muscle cramps, stiffness, or weakness -redness, blistering, peeling or loosening of the skin, including inside the mouth -trouble passing urine or change in the amount of urine Side effects that usually do not require medical attention (report to your doctor or health care professional if they continue or are bothersome): -bone, joint, or muscle pain -constipation -diarrhea -fever -hair  loss -irritation at site where injected -loss of appetite -nausea, vomiting -stomach upset -trouble sleeping -trouble swallowing -weak or tired This list may not describe all possible side effects. Call your doctor for medical advice about side effects. You may report side effects to FDA at 1-800-FDA-1088. Where should I keep my medicine? This drug is given in a hospital or clinic and will not be stored at home. NOTE: This sheet is a summary. It may not cover all possible information. If you have questions about this medicine, talk to your doctor, pharmacist, or health care provider.    2016, Elsevier/Gold Standard. (2013-11-26 14:19:39)  

## 2016-05-22 ENCOUNTER — Ambulatory Visit: Payer: 59 | Admitting: Family

## 2016-05-28 ENCOUNTER — Other Ambulatory Visit (HOSPITAL_BASED_OUTPATIENT_CLINIC_OR_DEPARTMENT_OTHER): Payer: 59

## 2016-05-28 ENCOUNTER — Ambulatory Visit (HOSPITAL_BASED_OUTPATIENT_CLINIC_OR_DEPARTMENT_OTHER): Payer: 59

## 2016-05-28 VITALS — BP 117/70 | HR 76 | Temp 98.3°F | Resp 20

## 2016-05-28 DIAGNOSIS — C9000 Multiple myeloma not having achieved remission: Secondary | ICD-10-CM

## 2016-05-28 DIAGNOSIS — Z5112 Encounter for antineoplastic immunotherapy: Secondary | ICD-10-CM

## 2016-05-28 LAB — CBC WITH DIFFERENTIAL (CANCER CENTER ONLY)
BASO#: 0 10*3/uL (ref 0.0–0.2)
BASO%: 0.5 % (ref 0.0–2.0)
EOS%: 3.3 % (ref 0.0–7.0)
Eosinophils Absolute: 0.1 10*3/uL (ref 0.0–0.5)
HEMATOCRIT: 33.3 % — AB (ref 38.7–49.9)
HEMOGLOBIN: 11.1 g/dL — AB (ref 13.0–17.1)
LYMPH#: 1 10*3/uL (ref 0.9–3.3)
LYMPH%: 25.8 % (ref 14.0–48.0)
MCH: 33.2 pg (ref 28.0–33.4)
MCHC: 33.3 g/dL (ref 32.0–35.9)
MCV: 100 fL — ABNORMAL HIGH (ref 82–98)
MONO#: 0.6 10*3/uL (ref 0.1–0.9)
MONO%: 14.1 % — AB (ref 0.0–13.0)
NEUT%: 56.3 % (ref 40.0–80.0)
NEUTROS ABS: 2.2 10*3/uL (ref 1.5–6.5)
Platelets: 213 10*3/uL (ref 145–400)
RBC: 3.34 10*6/uL — AB (ref 4.20–5.70)
RDW: 15.3 % (ref 11.1–15.7)
WBC: 4 10*3/uL (ref 4.0–10.0)

## 2016-05-28 LAB — TECHNOLOGIST REVIEW CHCC SATELLITE

## 2016-05-28 LAB — CMP (CANCER CENTER ONLY)
ALBUMIN: 3.7 g/dL (ref 3.3–5.5)
ALK PHOS: 57 U/L (ref 26–84)
ALT(SGPT): 40 U/L (ref 10–47)
AST: 28 U/L (ref 11–38)
BILIRUBIN TOTAL: 0.5 mg/dL (ref 0.20–1.60)
BUN, Bld: 13 mg/dL (ref 7–22)
CALCIUM: 8.8 mg/dL (ref 8.0–10.3)
CO2: 26 meq/L (ref 18–33)
CREATININE: 0.9 mg/dL (ref 0.6–1.2)
Chloride: 104 mEq/L (ref 98–108)
Glucose, Bld: 86 mg/dL (ref 73–118)
Potassium: 4.1 mEq/L (ref 3.3–4.7)
SODIUM: 138 meq/L (ref 128–145)
TOTAL PROTEIN: 6.4 g/dL (ref 6.4–8.1)

## 2016-05-28 MED ORDER — BORTEZOMIB CHEMO SQ INJECTION 3.5 MG (2.5MG/ML)
1.3000 mg/m2 | Freq: Once | INTRAMUSCULAR | Status: AC
  Administered 2016-05-28: 2.5 mg via SUBCUTANEOUS
  Filled 2016-05-28: qty 2.5

## 2016-05-28 MED ORDER — PROCHLORPERAZINE MALEATE 10 MG PO TABS
10.0000 mg | ORAL_TABLET | Freq: Once | ORAL | Status: AC
  Administered 2016-05-28: 10 mg via ORAL

## 2016-05-28 MED ORDER — PROCHLORPERAZINE MALEATE 10 MG PO TABS
ORAL_TABLET | ORAL | Status: AC
  Filled 2016-05-28: qty 1

## 2016-05-28 NOTE — Patient Instructions (Signed)

## 2016-05-28 NOTE — Discharge Instructions (Addendum)
Post stone removal/stent placement surgery instructions   Definitions:  Ureter: The duct that transports urine from the kidney to the bladder. Stent: A plastic hollow tube that is placed into the ureter, from the kidney to the bladder to prevent the ureter from swelling shut.  General instructions:  Despite the fact that no skin incisions were used, the area around the ureter and bladder is raw and irritated. The stent is a foreign body which will further irritate the bladder wall. This irritation is manifested by increased frequency of urination, both day and night, and by an increase in the urge to urinate. In some, the urge to urinate is present almost always. Sometimes the urge is strong enough that you may not be able to stop your self from urinating. The only real cure is to remove the stent and then give time for the bladder wall to heal which can't be done until the danger of the ureter swelling shut has passed. (This varies from 2-21 days).  You may see some blood in your urine while the stent is in place and a few days afterward. Do not be alarmed, even if the urine is clear for a while. Get off your feet and drink lots of fluids until clearing occurs. If you start to pass clots or don't improve, call us.  If you have a string coming from your urethra:  The stent string is attached to your ureteral stent.  Do not pull on thisIf you have a string coming from your urethra:  The stent string is attached to your ureteral stent.  Do not pull on this.  Diet:  You may return to your normal diet immediately. Because of the raw surface of your bladder, alcohol, spicy foods, foods high in acid and drinks with caffeine may cause irritation or frequency and should be used in moderation. To keep your urine flowing freely and avoid constipation, drink plenty of fluids during the day (8-10 glasses). Tip: Avoid cranberry juice because it is very acidic.  Activity:  Your physical activity doesn't need  to be restricted. However, if you are very active, you may see some blood in the urine. We suggest that you reduce your activity under the circumstances until the bleeding has stopped.  Bowels:  It is important to keep your bowels regular during the postoperative period. Straining with bowel movements can cause bleeding. A bowel movement every other day is reasonable. Use a mild laxative if needed, such as milk of magnesia 2-3 tablespoons, or 2 Dulcolax tablets. Call if you continue to have problems. If you had been taking narcotics for pain, before, during or after your surgery, you may be constipated. Take a laxative if necessary.     Medication:  You should resume your pre-surgery medications unless told not to. DO NOT RESUME YOUR ASPIRIN, or any other medicines like ibuprofen, motrin, excedrin, advil, aleve, vitamin E, fish oil as these can all cause bleeding x 7 days. In addition you may be given an antibiotic to prevent or treat infection. Antibiotics are not always necessary. All medication should be taken as prescribed until the bottles are finished unless you are having an unusual reaction to one of the drugs.  Restart Xarelto in 36-48 hours as long as the urine is clear. If it remains bloody after 48 hours contact the office prior to restarting your Xarelto.   Problems you should report to [place]:  a. Fever greater than 101F. b. Heavy bleeding, or clots (see notes above about  blood in urine). c. Inability to urinate. d. Drug reactions (hives, rash, nausea, vomiting, diarrhea). e. Severe burning or pain with urination that is not improving.  Followup:  You will need a followup appointment to monitor your progress in most cases. Please call the office for this appointment when you get home if your appointment has not already been scheduled. Usually the first appointment will be about 5-14 days after your surgery and if you have a stent in place it will likely be removed at that  time.  General Anesthesia, Adult, Care After These instructions provide you with information about caring for yourself after your procedure. Your health care provider may also give you more specific instructions. Your treatment has been planned according to current medical practices, but problems sometimes occur. Call your health care provider if you have any problems or questions after your procedure. What can I expect after the procedure? After the procedure, it is common to have:  Vomiting.  A sore throat.  Mental slowness. It is common to feel:  Nauseous.  Cold or shivery.  Sleepy.  Tired.  Sore or achy, even in parts of your body where you did not have surgery. Follow these instructions at home: For at least 24 hours after the procedure:  Do not:  Participate in activities where you could fall or become injured.  Drive.  Use heavy machinery.  Drink alcohol.  Take sleeping pills or medicines that cause drowsiness.  Make important decisions or sign legal documents.  Take care of children on your own.  Rest. Eating and drinking  If you vomit, drink water, juice, or soup when you can drink without vomiting.  Drink enough fluid to keep your urine clear or pale yellow.  Make sure you have little or no nausea before eating solid foods.  Follow the diet recommended by your health care provider. General instructions  Have a responsible adult stay with you until you are awake and alert.  Return to your normal activities as told by your health care provider. Ask your health care provider what activities are safe for you.  Take over-the-counter and prescription medicines only as told by your health care provider.  If you smoke, do not smoke without supervision.  Keep all follow-up visits as told by your health care provider. This is important. Contact a health care provider if:  You continue to have nausea or vomiting at home, and medicines are not  helpful.  You cannot drink fluids or start eating again.  You cannot urinate after 8-12 hours.  You develop a skin rash.  You have fever.  You have increasing redness at the site of your procedure. Get help right away if:  You have difficulty breathing.  You have chest pain.  You have unexpected bleeding.  You feel that you are having a life-threatening or urgent problem. This information is not intended to replace advice given to you by your health care provider. Make sure you discuss any questions you have with your health care provider. Document Released: 10/06/2000 Document Revised: 12/03/2015 Document Reviewed: 06/14/2015 Elsevier Interactive Patient Education  2017 [employer].

## 2016-05-28 NOTE — H&P (Signed)
CC: I have a ureteral stone.  HPI: Jorge [name] is a 60 year-old male patient with a ureteral calculus.  The the kidney stone was on the right side. He first noticed the symptoms 05/12/2016. This is his first kidney stone. There is not a history of calculus disease in the family. He is not currently having flank pain, back pain, groin pain, nausea, vomiting, fever or chills. He does not have a burning sensation when he urinates. He has not caught a stone in his urine strainer since his symptoms began.   He has never had surgical treatment for calculi in the past.   CT 05/12/16 - 62m wide x 134mlong stone at the UVJ on the right (900HU). 71m76mt. upper pole stone and no Lt. renal calculi.   He developed gross hematuria on Xarelto 24 hours ago. It has persisted. It prompted his visit to the emergency room where CT scan was obtained that revealed the cause was a 7 mm stone in his proximal right ureter. He is not having any pain. He just started taking Xarelto recently. He has a history of multiple myeloma and is going to be going for a stem cell transplant after the first of the year.     ALLERGIES: No Allergies    MEDICATIONS: Ambien 5 MG Oral Tablet 1 Oral  Dexamethasone  Famciclovir  Fentanyl 50 mcg/hour patch, transdermal 72 hours 1 PO Daily  Flomax 0.4 mg capsule, ext release 24 hr  Ibuprofen 400 MG Oral Tablet Oral  Revlimid 25 mg capsule  Tylenol Extra Strength 500 MG Oral Tablet Oral  Velcade  Xarelto 20 mg tablet  Zoloft 100 mg tablet     GU PSH: None     PSH Notes: Rotator Cuff Repair   NON-GU PSH: Back Surgery (Unspecified) Inguinal Hernia Repair > 5 yrs Rotator Cuff Surgery    GU PMH: Peyronies Disease, Peyronie's disease - 2014      PMH Notes: History of Peyronie's disease: He was initially evaluated in 12/08 and was noted to have a slight dorsal curvature of the penis which was not particularly painful and did not cause problems with intercourse. He did  empirically try Vitamin E with no real results. He has noted worsening dorsal curvature of the penis with a plaque that extends from the base of the penis approximately 4-5 cm. Although he has increased curvature, he has not noted any increased pain and has been able to continue to participate in intercourse without difficulty or problems. He has had some difficulty achieving orgasm about 30% of the time. He thinks that for about 6 months now at least the amount of curvature has remained stable although he's not 100% sure that that's the case. He describes the amount of curvature at about 90 although despite that he said he and his wife are still able to engage in intercourse. He was found, due to the screening for our collagenase study, that he had extensive plaque calcification which excluded him from the study.    NON-GU PMH: Personal history of other diseases of the digestive system, History of esophageal reflux - 2014 Personal history of other mental and behavioral disorders, History of depression - 2014 Anxiety Depression DVT, History Multiple myeloma not having achieved remission    FAMILY HISTORY: 1 Daughter - Runs in Family 2 sons - Runs in Family Hypertension - Father skin cancer - Father Uterine Cancer - Mother   SOCIAL HISTORY: Marital Status: Married Current Smoking Status: Patient does not  smoke anymore.  Does drink.  Drinks 2 caffeinated drinks per day.     Notes: Marital History - Currently Married, Tobacco Use, Alcohol Use, Occupation:   REVIEW OF SYSTEMS:    GU Review Male:   Patient reports get up at night to urinate. Patient denies frequent urination, hard to postpone urination, burning/ pain with urination, leakage of urine, stream starts and stops, trouble starting your stream, have to strain to urinate , erection problems, and penile pain.  Gastrointestinal (Upper):   Patient reports indigestion/ heartburn. Patient denies nausea and vomiting.  Gastrointestinal  (Lower):   Patient reports constipation. Patient denies diarrhea.  Constitutional:   Patient reports fatigue. Patient denies fever, night sweats, and weight loss.  Skin:   Patient denies skin rash/ lesion and itching.  Eyes:   Patient denies blurred vision and double vision.  Ears/ Nose/ Throat:   Patient denies sore throat and sinus problems.  Hematologic/Lymphatic:   Patient reports easy bruising. Patient denies swollen glands.  Cardiovascular:   Patient reports leg swelling. Patient denies chest pains.  Respiratory:   Patient denies cough and shortness of breath.  Endocrine:   Patient denies excessive thirst.  Musculoskeletal:   Patient reports back pain. Patient denies joint pain.  Neurological:   Patient denies headaches and dizziness.  Psychologic:   Patient reports depression and anxiety.    VITAL SIGNS:    Weight 178 lb / 80.74 kg  Height 70 in / 177.8 cm  BP 123/79 mmHg  Pulse 73 /min  BMI 25.5 kg/m   MULTI-SYSTEM PHYSICAL EXAMINATION:    Constitutional: Well-nourished. No physical deformities. Normally developed. Good grooming.  Neck: Neck symmetrical, not swollen. Normal tracheal position.  Respiratory: No labored breathing, no use of accessory muscles.   Cardiovascular: Normal temperature, normal extremity pulses, no swelling, no varicosities.  Lymphatic: No enlargement of neck, axillae, groin.  Skin: No paleness, no jaundice, no cyanosis. No lesion, no ulcer, no rash.  Neurologic / Psychiatric: Oriented to time, oriented to place, oriented to person. No depression, no anxiety, no agitation.  Gastrointestinal: No mass, no tenderness, no rigidity, non obese abdomen.  Eyes: Normal conjunctivae. Normal eyelids.  Ears, Nose, Mouth, and Throat: Left ear no scars, no lesions, no masses. Right ear no scars, no lesions, no masses. Nose no scars, no lesions, no masses. Normal hearing. Normal lips.  Musculoskeletal: Normal gait and station of head and neck.     PAST DATA  REVIEWED:  Source Of History:  Patient  Records Review:   Previous Hospital Records, Previous Patient Records  Urine Test Review:   Urinalysis  X-Ray Review: C.T. Abdomen/Pelvis: Reviewed Films. Reviewed Report. Discussed With Patient. Done 05/12/16    PROCEDURES:          Urinalysis w/Scope Dipstick Dipstick Cont'd Micro  Color: Red Bilirubin: Invalid WBC/hpf: NS (Not Seen)  Appearance: Turbid Ketones: Invalid RBC/hpf: >60/hpf  Specific Gravity: Invalid Blood: Invalid Bacteria: Rare (0-9/hpf)  pH: Invalid Protein: Invalid Cystals: NS (Not Seen)  Glucose: Invalid Urobilinogen: Invalid Casts: NS (Not Seen)    Nitrites: Invalid Trichomonas: Not Present    Leukocyte Esterase: Invalid Mucous: Not Present      Epithelial Cells: NS (Not Seen)      Yeast: NS (Not Seen)      Sperm: Not Present    Notes: microscopic performed on unconcentrated urine    ASSESSMENT:      ICD-10 Details  1 GU:   Calculus Ureter - N20.1 Right, Acute - I will tentatively  plan to see him back in 4 months for a right renal ultrasound.  2   Gross hematuria - R31.0 Acute - He is currently experiencing gross hematuria.Told him that if this persists over the next 48 hours he should contact me.          Notes:   He has a proximal right ureteral stone with a low probability of spontaneous passage. Typically I would recommend either lithotripsy or ureteroscopy however he must remain on Xarelto for a recent DVT and will need to be on that 6 months. Complicating matters is the fact that he is going to be undergoing stem cell transplant after the first of the year and so we discussed the options. If he develops severe flank pain he could be temporized with a stent. If his hematuria clears observation and treatment of his stone once he is able to safely come off of his antiplatelet therapy would be the best width monitoring of his kidney for development of silent obstruction which we discussed today. Ureteroscopy is an option and  can be performed on patient's taking Xarelto however there is some risk in doing this and if it can be avoided I would like to do so. We did discuss using Lovenox as a bridge and potentially performing ureteroscopy if it became absolutely necessary.  I told him if he developed intractable pain or continued to have gross hematuria he should contact me. Otherwise what we have decided to do is monitor him with a repeat right renal ultrasound again in 2 months and follow him every 2 months with an ultrasound to assure he does not develop silent obstruction and potentially treat his stone in 6 months when he is able to come off his antiplatelet therapy.   I subsequently spoke with Dr. Marin Olp about his case.  He indicated that he wanted to have the stone treated before he underwent his stem cell transplant which I think is reasonable especially since Dr. Marin Olp said he could safely come off his Xarelto in preparation for ureteroscopy.  I think ureteroscopy has the greatest probability of obtaining a stroke free state and therefore spoke with the patient about this and he will stop his Xarelto 48 hours prior to his surgery.   PLAN: 1.  Stop Xarelto 2 days prior to surgery. 2.  Proceed with cystoscopy, right retrograde pyelogram, laser lithotripsy and stent placement.

## 2016-05-29 ENCOUNTER — Ambulatory Visit: Payer: 59 | Admitting: Family

## 2016-05-30 ENCOUNTER — Ambulatory Visit (HOSPITAL_COMMUNITY): Payer: 59 | Admitting: Anesthesiology

## 2016-05-30 ENCOUNTER — Encounter (HOSPITAL_COMMUNITY)
Admission: RE | Disposition: A | Discharge status: Home / Self Care | Payer: Self-pay | Source: Ambulatory Visit | Attending: Urology

## 2016-05-30 ENCOUNTER — Ambulatory Visit (HOSPITAL_COMMUNITY)
Admission: RE | Admit: 2016-05-30 | Discharge: 2016-05-30 | Disposition: A | Discharge status: Home / Self Care | Payer: 59 | Source: Ambulatory Visit | Attending: Urology | Admitting: Urology

## 2016-05-30 ENCOUNTER — Encounter (HOSPITAL_COMMUNITY): Payer: Self-pay | Admitting: *Deleted

## 2016-05-30 ENCOUNTER — Ambulatory Visit (HOSPITAL_COMMUNITY): Payer: 59

## 2016-05-30 DIAGNOSIS — K449 Diaphragmatic hernia without obstruction or gangrene: Secondary | ICD-10-CM | POA: Diagnosis not present

## 2016-05-30 DIAGNOSIS — N135 Crossing vessel and stricture of ureter without hydronephrosis: Secondary | ICD-10-CM | POA: Insufficient documentation

## 2016-05-30 DIAGNOSIS — Z7901 Long term (current) use of anticoagulants: Secondary | ICD-10-CM | POA: Diagnosis not present

## 2016-05-30 DIAGNOSIS — F329 Major depressive disorder, single episode, unspecified: Secondary | ICD-10-CM | POA: Diagnosis not present

## 2016-05-30 DIAGNOSIS — R310 Gross hematuria: Secondary | ICD-10-CM | POA: Insufficient documentation

## 2016-05-30 DIAGNOSIS — C9000 Multiple myeloma not having achieved remission: Secondary | ICD-10-CM | POA: Diagnosis not present

## 2016-05-30 DIAGNOSIS — N202 Calculus of kidney with calculus of ureter: Secondary | ICD-10-CM | POA: Diagnosis present

## 2016-05-30 DIAGNOSIS — F419 Anxiety disorder, unspecified: Secondary | ICD-10-CM | POA: Diagnosis not present

## 2016-05-30 DIAGNOSIS — Z87891 Personal history of nicotine dependence: Secondary | ICD-10-CM | POA: Diagnosis not present

## 2016-05-30 DIAGNOSIS — N200 Calculus of kidney: Secondary | ICD-10-CM

## 2016-05-30 DIAGNOSIS — K219 Gastro-esophageal reflux disease without esophagitis: Secondary | ICD-10-CM | POA: Diagnosis not present

## 2016-05-30 HISTORY — PX: CYSTOSCOPY WITH RETROGRADE PYELOGRAM, URETEROSCOPY AND STENT PLACEMENT: SHX5789

## 2016-05-30 HISTORY — DX: Gastro-esophageal reflux disease without esophagitis: K21.9

## 2016-05-30 LAB — PROTIME-INR
INR: 1.15
PROTHROMBIN TIME: 14.8 s (ref 11.4–15.2)

## 2016-05-30 SURGERY — CYSTOURETEROSCOPY, WITH RETROGRADE PYELOGRAM AND STENT INSERTION
Anesthesia: General | Laterality: Right

## 2016-05-30 MED ORDER — EPHEDRINE SULFATE 50 MG/ML IJ SOLN
INTRAMUSCULAR | Status: DC | PRN
  Administered 2016-05-30: 10 mg via INTRAVENOUS

## 2016-05-30 MED ORDER — 0.9 % SODIUM CHLORIDE (POUR BTL) OPTIME
TOPICAL | Status: DC | PRN
  Administered 2016-05-30: 1000 mL

## 2016-05-30 MED ORDER — ONDANSETRON HCL 4 MG/2ML IJ SOLN
INTRAMUSCULAR | Status: DC | PRN
  Administered 2016-05-30: 4 mg via INTRAVENOUS

## 2016-05-30 MED ORDER — MIDAZOLAM HCL 2 MG/2ML IJ SOLN
INTRAMUSCULAR | Status: AC
  Filled 2016-05-30: qty 2

## 2016-05-30 MED ORDER — OXYCODONE HCL 5 MG PO TABS
10.0000 mg | ORAL_TABLET | Freq: Once | ORAL | Status: AC
  Administered 2016-05-30: 10 mg via ORAL
  Filled 2016-05-30: qty 2

## 2016-05-30 MED ORDER — LACTATED RINGERS IV SOLN
INTRAVENOUS | Status: DC
  Administered 2016-05-30: 1000 mL via INTRAVENOUS

## 2016-05-30 MED ORDER — HYDROMORPHONE HCL 1 MG/ML IJ SOLN: 0.2500 mg | INTRAMUSCULAR | Status: DC | PRN

## 2016-05-30 MED ORDER — EPHEDRINE 5 MG/ML INJ
INTRAVENOUS | Status: AC
  Filled 2016-05-30: qty 10

## 2016-05-30 MED ORDER — LIDOCAINE HCL (CARDIAC) 20 MG/ML IV SOLN
INTRAVENOUS | Status: DC | PRN
  Administered 2016-05-30: 50 mg via INTRAVENOUS

## 2016-05-30 MED ORDER — FENTANYL CITRATE (PF) 100 MCG/2ML IJ SOLN
INTRAMUSCULAR | Status: AC
  Filled 2016-05-30: qty 2

## 2016-05-30 MED ORDER — BELLADONNA ALKALOIDS-OPIUM 16.2-60 MG RE SUPP
RECTAL | Status: AC
  Filled 2016-05-30: qty 1

## 2016-05-30 MED ORDER — PROPOFOL 10 MG/ML IV BOLUS
INTRAVENOUS | Status: AC
  Filled 2016-05-30: qty 20

## 2016-05-30 MED ORDER — ONDANSETRON HCL 4 MG/2ML IJ SOLN
INTRAMUSCULAR | Status: AC
  Filled 2016-05-30: qty 2

## 2016-05-30 MED ORDER — SODIUM CHLORIDE 0.9 % IR SOLN
Status: DC | PRN
  Administered 2016-05-30: 1000 mL

## 2016-05-30 MED ORDER — LIDOCAINE HCL 2 % EX GEL
CUTANEOUS | Status: AC
  Filled 2016-05-30: qty 5

## 2016-05-30 MED ORDER — PROMETHAZINE HCL 25 MG/ML IJ SOLN: 6.2500 mg | INTRAMUSCULAR | Status: DC | PRN

## 2016-05-30 MED ORDER — PHENAZOPYRIDINE HCL 200 MG PO TABS
200.0000 mg | ORAL_TABLET | Freq: Once | ORAL | Status: AC
  Administered 2016-05-30: 200 mg via ORAL
  Filled 2016-05-30: qty 1

## 2016-05-30 MED ORDER — MEPERIDINE HCL 50 MG/ML IJ SOLN: 6.2500 mg | INTRAMUSCULAR | Status: DC | PRN

## 2016-05-30 MED ORDER — DEXAMETHASONE SODIUM PHOSPHATE 4 MG/ML IJ SOLN
INTRAMUSCULAR | Status: DC | PRN
  Administered 2016-05-30: 10 mg via INTRAVENOUS

## 2016-05-30 MED ORDER — CIPROFLOXACIN IN D5W 400 MG/200ML IV SOLN
INTRAVENOUS | Status: AC
  Filled 2016-05-30: qty 200

## 2016-05-30 MED ORDER — PROPOFOL 10 MG/ML IV BOLUS
INTRAVENOUS | Status: DC | PRN
  Administered 2016-05-30: 200 mg via INTRAVENOUS

## 2016-05-30 MED ORDER — FENTANYL CITRATE (PF) 100 MCG/2ML IJ SOLN
INTRAMUSCULAR | Status: DC | PRN
  Administered 2016-05-30 (×3): 25 ug via INTRAVENOUS
  Administered 2016-05-30: 50 ug via INTRAVENOUS

## 2016-05-30 MED ORDER — IOPAMIDOL (ISOVUE-300) INJECTION 61%
INTRAVENOUS | Status: DC | PRN
  Administered 2016-05-30: 50 mL

## 2016-05-30 MED ORDER — CIPROFLOXACIN IN D5W 400 MG/200ML IV SOLN
400.0000 mg | INTRAVENOUS | Status: AC
  Administered 2016-05-30: 400 mg via INTRAVENOUS

## 2016-05-30 MED ORDER — PHENAZOPYRIDINE HCL 200 MG PO TABS: 200.0000 mg | ORAL_TABLET | Freq: Three times a day (TID) | ORAL | 0 refills | Status: DC | PRN

## 2016-05-30 MED ORDER — MIDAZOLAM HCL 5 MG/5ML IJ SOLN
INTRAMUSCULAR | Status: DC | PRN
  Administered 2016-05-30: 2 mg via INTRAVENOUS

## 2016-05-30 MED ORDER — OXYCODONE HCL 10 MG PO TABS: 10.0000 mg | ORAL_TABLET | ORAL | 0 refills | Status: DC | PRN

## 2016-05-30 MED ORDER — DEXAMETHASONE SODIUM PHOSPHATE 10 MG/ML IJ SOLN
INTRAMUSCULAR | Status: AC
  Filled 2016-05-30: qty 1

## 2016-05-30 MED ORDER — SODIUM CHLORIDE 0.9 % IR SOLN
Status: DC | PRN
  Administered 2016-05-30: 3000 mL

## 2016-05-30 MED ORDER — LIDOCAINE 2% (20 MG/ML) 5 ML SYRINGE
INTRAMUSCULAR | Status: AC
  Filled 2016-05-30: qty 5

## 2016-05-30 MED ORDER — METOPROLOL TARTRATE 5 MG/5ML IV SOLN
INTRAVENOUS | Status: AC
  Filled 2016-05-30: qty 5

## 2016-05-30 MED ORDER — SUCCINYLCHOLINE CHLORIDE 20 MG/ML IJ SOLN
INTRAMUSCULAR | Status: DC | PRN
  Administered 2016-05-30: 40 mg via INTRAVENOUS

## 2016-05-30 SURGICAL SUPPLY — 23 items
BAG URO CATCHER STRL LF (MISCELLANEOUS) ×2 IMPLANT
BALLN NEPHROSTOMY (BALLOONS) ×2
BALLOON NEPHROSTOMY (BALLOONS) ×1 IMPLANT
BASKET ZERO TIP 1.9FR (BASKET) ×2 IMPLANT
CATH INTERMIT  6FR 70CM (CATHETERS) ×2 IMPLANT
CATH URET DUAL LUMEN 6-10FR 50 (CATHETERS) ×2 IMPLANT
CLOTH BEACON ORANGE TIMEOUT ST (SAFETY) ×2 IMPLANT
FIBER LASER FLEXIVA 1000 (UROLOGICAL SUPPLIES) IMPLANT
FIBER LASER FLEXIVA 365 (UROLOGICAL SUPPLIES) IMPLANT
FIBER LASER FLEXIVA 550 (UROLOGICAL SUPPLIES) IMPLANT
FIBER LASER TRAC TIP (UROLOGICAL SUPPLIES) IMPLANT
GLOVE BIOGEL M 8.0 STRL (GLOVE) ×2 IMPLANT
GOWN STRL REUS W/TWL XL LVL3 (GOWN DISPOSABLE) ×2 IMPLANT
GUIDEWIRE STR DUAL SENSOR (WIRE) ×4 IMPLANT
IV NS 1000ML (IV SOLUTION) ×1
IV NS 1000ML BAXH (IV SOLUTION) ×1 IMPLANT
MANIFOLD NEPTUNE II (INSTRUMENTS) ×2 IMPLANT
PACK CYSTO (CUSTOM PROCEDURE TRAY) ×2 IMPLANT
SHEATH ACCESS URETERAL 24CM (SHEATH) IMPLANT
SHEATH ACCESS URETERAL 38CM (SHEATH) ×2 IMPLANT
SHEATH ACCESS URETERAL 54CM (SHEATH) IMPLANT
STENT POLARIS 7FRX24X.038 (STENTS) ×2 IMPLANT
TUBING CONNECTING 10 (TUBING) ×2 IMPLANT

## 2016-05-30 NOTE — Progress Notes (Signed)
Gaynelle Cage RN completing charting for [employer] RN who has left for the day.

## 2016-05-30 NOTE — Anesthesia Procedure Notes (Signed)
Procedure Name: LMA Insertion Date/Time: 05/30/2016 10:50 AM Performed by: Deliah Boston Pre-anesthesia Checklist: Patient identified, Emergency Drugs available, Suction available and Patient being monitored Patient Re-evaluated:Patient Re-evaluated prior to inductionOxygen Delivery Method: Circle system utilized Preoxygenation: Pre-oxygenation with 100% oxygen Intubation Type: IV induction Ventilation: Mask ventilation without difficulty LMA: LMA inserted LMA Size: 5.0 Number of attempts: 1 Placement Confirmation: positive ETCO2 and breath sounds checked- equal and bilateral Tube secured with: Tape Dental Injury: Teeth and Oropharynx as per pre-operative assessment

## 2016-05-30 NOTE — Transfer of Care (Signed)
Immediate Anesthesia Transfer of Care Note  Patient: Jorge [name]  Procedure(s) Performed: Procedure(s): CYSTOSCOPY WITH RETROGRADE PYELOGRAM, URETEROSCOPY AND STENT PLACEMENT,DILITATION URETERAL STRICTURE (Right) HOLMIUM LASER APPLICATION (Right)  Patient Location: PACU  Anesthesia Type:General  Level of Consciousness: Patient easily awoken, sedated, comfortable, cooperative, following commands, responds to stimulation.   Airway & Oxygen Therapy: Patient spontaneously breathing, ventilating well, oxygen via simple oxygen mask.  Post-op Assessment: Report given to PACU RN, vital signs reviewed and stable, moving all extremities.   Post vital signs: Reviewed and stable.  Complications: No apparent anesthesia complications  Last Vitals:  Vitals:   05/30/16 0819 05/30/16 1147  BP: 120/80 122/75  Pulse: 72 71  Resp: 18   Temp: 36.8 C     Last Pain:  Vitals:   05/30/16 0847  TempSrc:   PainSc: 3          Complications: No apparent anesthesia complications

## 2016-05-30 NOTE — Anesthesia Preprocedure Evaluation (Signed)
Anesthesia Evaluation  Patient identified by MRN, date of birth, ID band Patient awake    Reviewed: Allergy & Precautions, NPO status , Patient's Chart, lab work & pertinent test results  Airway Mallampati: II  TM Distance: >3 FB Neck ROM: Full    Dental  (+) Teeth Intact, Dental Advisory Given   Pulmonary neg pulmonary ROS, former smoker,    breath sounds clear to auscultation       Cardiovascular negative cardio ROS   Rhythm:Regular Rate:Normal     Neuro/Psych  Headaches, PSYCHIATRIC DISORDERS Anxiety Depression    GI/Hepatic Neg liver ROS, hiatal hernia, GERD  ,  Endo/Other  negative endocrine ROS  Renal/GU negative Renal ROS     Musculoskeletal negative musculoskeletal ROS (+)   Abdominal   Peds  Hematology negative hematology ROS (+)   Anesthesia Other Findings   Reproductive/Obstetrics negative OB ROS                             Anesthesia Physical  Anesthesia Plan  ASA: III  Anesthesia Plan: General   Post-op Pain Management:    Induction: Intravenous  Airway Management Planned: LMA  Additional Equipment:   Intra-op Plan:   Post-operative Plan: Extubation in OR  Informed Consent: I have reviewed the patients History and Physical, chart, labs and discussed the procedure including the risks, benefits and alternatives for the proposed anesthesia with the patient or authorized representative who has indicated his/her understanding and acceptance.     Plan Discussed with: CRNA and Anesthesiologist  Anesthesia Plan Comments:        Anesthesia Quick Evaluation

## 2016-05-30 NOTE — Anesthesia Postprocedure Evaluation (Signed)
Anesthesia Post Note  Patient: Jorge [name]  Procedure(s) Performed: Procedure(s) (LRB): CYSTOSCOPY WITH RETROGRADE PYELOGRAM, URETEROSCOPY AND STENT PLACEMENT,DILITATION URETERAL STRICTURE (Right) HOLMIUM LASER APPLICATION (Right)  Patient location during evaluation: PACU Anesthesia Type: General Level of consciousness: sedated and patient cooperative Pain management: pain level controlled Vital Signs Assessment: post-procedure vital signs reviewed and stable Respiratory status: spontaneous breathing Cardiovascular status: stable Anesthetic complications: no    Last Vitals:  Vitals:   05/30/16 1215 05/30/16 1230  BP: 121/83 124/79  Pulse: 60 (!) 58  Resp: (!) 21 13  Temp:  36.7 C    Last Pain:  Vitals:   05/30/16 1215  TempSrc:   PainSc: 0-No pain                 Nolon Nations

## 2016-05-30 NOTE — Op Note (Signed)
PATIENT:  Jorge [name]  PRE-OPERATIVE DIAGNOSIS: Right proximal ureteral stone  POST-OPERATIVE DIAGNOSIS: 1. Right proximal ureteral stone. 2. Ureteral stricture  PROCEDURE: 1. Cystoscopy with right retrograde pyelogram including interpretation.  2. Right ureteroscopy. 3. Dilatation of ureteral stricture. 4. Right double-J stent placement (This was the first stage of a staged procedure.)  SURGEON:  Mark C Ottelin  INDICATION: Jorge [name] is a 60-year-old male who was found to have a stone located in his proximal right ureter. He had been taking antiplatelet therapy for a DVT and it was determined this could be stopped safely. It was and he is brought to the operating room for ureteroscopic management of his stone as it was felt this would result in a better stone free rate then lithotripsy. Also I felt it would reduce the risk of perinephric hematoma formation.  ANESTHESIA:  General  EBL:  Minimal  DRAINS: 7 French, 24 cm double-J stent in the right ureter (no string)  LOCAL MEDICATIONS USED:  None  SPECIMEN:  None  Description of procedure: After informed consent the patient was taken to the operating room and placed on the table in a supine position. General anesthesia was then administered. Once fully anesthetized the patient was moved to the dorsal lithotomy position and the genitalia were sterilely prepped and draped in standard fashion. An official timeout was then performed.  A 23 French cystoscope was passed under direct vision down the urethra with a 30 lens. Urethra was noted be entirely normal. The prostatic urethra revealed no lesions and was nonobstructing. Upon entering the bladder the bladder was fully and systematically inspected and was noted be free of any tumors, stones or inflammatory lesions. Both ureteral orifices were of normal configuration and position.  A 6 French open-ended ureteral catheter was passed through the cystoscope and into the right ureter.  Full-strength Omnipaque contrast was then injected through the open-ended catheter up the right ureter under direct fluoroscopic control. This revealed a normal distal ureter with a filling defect located just below the ureteropelvic junction consistent with his known stone. I therefore passed a 0.038 inch floppy-tipped guidewire through the open ended catheter and into the area of the renal pelvis in doing this it appeared to of dislodge the stone which fell back into the kidney.  I first dilated the intramural ureter by passing the inner portion of a ureteral access sheath over the guidewire and found the ureter to be quite narrow and difficult to advance the inner portion of the access sheath much beyond about halfway up the ureter. Because of the difficulty I felt placing a second guidewire was indicated and therefore I passed a dual lumen ureteral catheter over the guidewire and a second guidewire was placed and affixed to the drape as a safety guidewire. Over the working guidewire I then attempted ureteroscopy.  The 6 French digital, flexible ureteroscope was passed over the guidewire and up to the level of his most inferior spinal hardware where it would not advance further. Visualization revealed no definite stricture but I could not advance the scope beyond this point. I therefore removed the ureteroscope and over the working guidewire passed a ureteral dilating balloon but was unable to get this into the area the kidney. I inflated it partially to try to dilate the area but did not feel that this would likely result in access and when it was removed and the ureteroscope again passed over the guidewire I still could not advance it beyond this location. I removed the   ureteroscope and tried to dilate the area with the dual lumen ureteral catheter thinking that this may be small enough to allow dilation but large enough to allow the scope beyond this but attempts at passing the ureteroscope again over the  guidewire met resistance in the same location. I removed the guidewire in order to achieve the greatest amount of flow and noted no definite lesion in this location. This was where the stone was previously located and I suspect it is unyielding due to the presence of inflammation. Since the stone had fallen into the kidney it was not felt rigid ureteroscopy would be possible as the stone would likely not be visible and therefore I removed the ureteroscope.  The cystoscope was backloaded over the guidewire and the stent was passed over the guidewire into the area of the renal pelvis under fluoroscopy. Good curl was noted in the area of the renal pelvis and in the bladder. The bladder was drained, the cystoscope was removed and the patient was awakened and taken to the recovery room in stable and satisfactory condition. Because of the difficulty this will need to be a staged procedure with the second stage of the procedure performed after an adequate time has passed to allow some dilation of the ureter.    PLAN OF CARE: Discharge to home after PACU  PATIENT DISPOSITION:  PACU - hemodynamically stable.   

## 2016-06-01 NOTE — Progress Notes (Addendum)
History multiple myeloma not having achieved remission 02/12/2016 Bone Marrow, aspirate biopsy,and clot, Left posterior Iliac crest= Plasma cell myeloma,   Gross hematuria  Has a 81m stone in right proximal ureter  05/30/16: Dr, MKathie Rhodes MD PROCEDURE: 1. Cystoscopy with right retrograde pyelogram including interpretation.  2. Right ureteroscopy. 3. Dilatation of ureteral stricture. 4. Right double-J stent placement (This was the first stage of a staged procedure.)  Recent DVT, to stay  on Xarelto , follow up in 4 months, unless intractable pain, or gross hematuria ,   Patient to be undergoing stem cell  Transplant  After the first of the year in 2018  Dr, EMarin Olp Patient on Velcade and Revlimid  Dr. EMarin Olpreferral   Having low back pain  Since having kyhoplasty Next appt 06/04/16 with Dr. EMarin Olpand infusion    Allergies:NKA   Married, depression, anxiety, hx peyronie's disease,stopped smoking,  Drinks alcohol,,  Mother Uterine Cancer, Father skin cancer,

## 2016-06-02 ENCOUNTER — Encounter: Payer: Self-pay | Admitting: Radiation Oncology

## 2016-06-02 ENCOUNTER — Ambulatory Visit
Admission: RE | Admit: 2016-06-02 | Discharge: 2016-06-02 | Disposition: A | Discharge status: Home / Self Care | Payer: 59 | Source: Ambulatory Visit | Attending: Radiation Oncology | Admitting: Radiation Oncology

## 2016-06-02 DIAGNOSIS — Z8249 Family history of ischemic heart disease and other diseases of the circulatory system: Secondary | ICD-10-CM | POA: Insufficient documentation

## 2016-06-02 DIAGNOSIS — Z87891 Personal history of nicotine dependence: Secondary | ICD-10-CM | POA: Insufficient documentation

## 2016-06-02 DIAGNOSIS — K5900 Constipation, unspecified: Secondary | ICD-10-CM | POA: Diagnosis not present

## 2016-06-02 DIAGNOSIS — Z79899 Other long term (current) drug therapy: Secondary | ICD-10-CM | POA: Insufficient documentation

## 2016-06-02 DIAGNOSIS — F329 Major depressive disorder, single episode, unspecified: Secondary | ICD-10-CM | POA: Insufficient documentation

## 2016-06-02 DIAGNOSIS — C9000 Multiple myeloma not having achieved remission: Secondary | ICD-10-CM

## 2016-06-02 DIAGNOSIS — Z7901 Long term (current) use of anticoagulants: Secondary | ICD-10-CM | POA: Insufficient documentation

## 2016-06-02 DIAGNOSIS — F419 Anxiety disorder, unspecified: Secondary | ICD-10-CM | POA: Insufficient documentation

## 2016-06-02 DIAGNOSIS — M546 Pain in thoracic spine: Secondary | ICD-10-CM | POA: Diagnosis not present

## 2016-06-02 DIAGNOSIS — Z86718 Personal history of other venous thrombosis and embolism: Secondary | ICD-10-CM | POA: Diagnosis not present

## 2016-06-02 DIAGNOSIS — Z510 Encounter for antineoplastic radiation therapy: Secondary | ICD-10-CM | POA: Diagnosis not present

## 2016-06-02 DIAGNOSIS — Z87442 Personal history of urinary calculi: Secondary | ICD-10-CM | POA: Diagnosis not present

## 2016-06-02 DIAGNOSIS — K219 Gastro-esophageal reflux disease without esophagitis: Secondary | ICD-10-CM | POA: Insufficient documentation

## 2016-06-02 DIAGNOSIS — Z8049 Family history of malignant neoplasm of other genital organs: Secondary | ICD-10-CM | POA: Insufficient documentation

## 2016-06-02 NOTE — Progress Notes (Signed)
  Radiation Oncology         [phone]) [phone] ________________________________  Name: Jorge [name] MRN: [id number]  Date: 06/02/2016  DOB: 19-Jul-1955  SIMULATION AND TREATMENT PLANNING NOTE    ICD-9-CM ICD-10-CM   1. Multiple myeloma not having achieved remission ([place]) 203.00 C90.00     DIAGNOSIS:  IgG Kappa myeloma  NARRATIVE:  The patient was brought to the [facility].  Identity was confirmed.  All relevant records and images related to the planned course of therapy were reviewed.  The patient freely provided informed written consent to proceed with treatment after reviewing the details related to the planned course of therapy. The consent form was witnessed and verified by the simulation staff.  Then, the patient was set-up in a stable reproducible  supine position for radiation therapy.  CT images were obtained.  Surface markings were placed.  The CT images were loaded into the planning software.  Then the target and avoidance structures were contoured.  Treatment planning then occurred.  The radiation prescription was entered and confirmed.  Then, I designed and supervised the construction of a total of 3 medically necessary complex treatment devices.  I have requested : 3D Simulation  I have requested a DVH of the following structures: GTV, esophagus, lungs, heart.  I have ordered:dose calc.  PLAN:  The patient will receive 25 Gy in 10 fractions.  -----------------------------------  Blair Promise, PhD, MD

## 2016-06-02 NOTE — Progress Notes (Signed)
Histology and Location of Primary Cancer: multiple myeloma   Sites of Visceral and Bony Metastatic Disease: thoracic spine  Location(s) of Symptomatic Metastases: middle back  Past/Anticipated chemotherapy by medical oncology, if any: S/p cycle #1 of RVD. Zometa 4 mg IV q month. Xarelto 2o mg po q day - started on 03/21/2016.  Pain on a scale of 0-10 is: 5 in his lower right side and middle back.  Using a fentanyl patch and oxycodone for breakthrough.    If Spine Met(s), symptoms, if any, include:  Bowel/Bladder retention or incontinence (please describe): reports having constipation from pain medication.  Numbness or weakness in extremities (please describe): no  Current Decadron regimen, if applicable: takes decadron once a week.  Ambulatory status? Walker? Wheelchair?: Ambulatory  SAFETY ISSUES:  Prior radiation? no  Pacemaker/ICD? no  Possible current pregnancy? no  Is the patient on methotrexate? no  Current Complaints / other details:  Patient is here with his wife. He mentioned that he was hospitalized for a kidney stone last week and had a CYSTOSCOPY WITH RETROGRADE PYELOGRAM, URETEROSCOPY AND STENT PLACEMENT,DILITATION URETERAL STRICTURE by Kathie Rhodes, MD.  He reports having blood in his urine after he started taking Xarelto again.  He said Dr. Karsten Ro is aware.  BP 124/77 (BP Location: Right Arm, Patient Position: Sitting)   Pulse 72   Temp 97.9 F (36.6 C) (Oral)   Ht '5\' 10"'  (1.778 m)   Wt 176 lb (79.8 kg)   SpO2 97%   BMI 25.25 kg/m    Wt Readings from Last 3 Encounters:  06/02/16 176 lb (79.8 kg)  05/30/16 174 lb (78.9 kg)  05/14/16 179 lb (81.2 kg)

## 2016-06-02 NOTE — Progress Notes (Signed)
Radiation Oncology         [phone] ________________________________  Initial Outpatient Consultation  Name: Jorge [name] MRN: [id number]  Date: 06/02/2016  DOB: [birth year]  FV:[id number], Jorge Manly, PA-C  Ennever, Rudell Cobb, MD   REFERRING PHYSICIAN: [name], MD  DIAGNOSIS: The encounter diagnosis was Multiple myeloma not having achieved remission ([name]). IgG Kappa myeloma  HISTORY OF PRESENT ILLNESS::[name] is a 60 y.o. male who presents to the clinic today with symptomatic metastases of the middle back. The patient has a principle diagnosis of IgG Kappa myeloma - Hyperdiploid/+11 as well as DVT of the left and right legs. The patient's myeloma was confirmed via bone marrow biopsy on 02/12/16 with Dr. Marin Olp. He had 70% plasma cells in the marrow. A following PET scan showed some hyperactivity with bone lesions. The patient and his wife are here today to discuss the possible benefits of radiation therapy.  The patient reports pain in his lower right side and middle back which is 5/10 in severity. He reports he is using a fentanyl patch and oxycodone for breakthrough pain. The patient reports constipation which he attributes to his pain medicine. He denies numbness or weakness in his extremities. He currently takes Decadron once per week.  The patient reports that he was hospitalized for a kidney stone last week and had a cystoscopy with retrograde pyelogram, ureteroscopy and stent placement, dilation ureteral stricture by Dr. Karsten Ro. He reports hematuria after he started taking Xarelto again; he reports Dr. Karsten Ro is aware of this.  His wife is in attendance today, and questions if radiation could cause any problems with other treatments, or future treatments. The patient also inquires about what exercise or physical therapy he can participate in.  PREVIOUS RADIATION THERAPY: No  PAST MEDICAL HISTORY:  has a past medical history of Anxiety; Depression; GERD  (gastroesophageal reflux disease); Headache; History of blood clots; History of chicken pox; History of hiatal hernia; History of kidney stones; Measles; Multiple myeloma ([name]) (02/22/2016); and Myeloma ([name]) (2017).    PAST SURGICAL HISTORY: Past Surgical History:  Procedure Laterality Date  . COLONOSCOPY  [id number]  . CYSTOSCOPY WITH RETROGRADE PYELOGRAM, URETEROSCOPY AND STENT PLACEMENT Right 05/30/2016   Procedure: CYSTOSCOPY WITH RETROGRADE PYELOGRAM, URETEROSCOPY AND STENT PLACEMENT,DILITATION URETERAL STRICTURE;  Surgeon: Kathie Rhodes, MD;  Location: [facility];  Service: Urology;  Laterality: Right;  . INGUINAL HERNIA REPAIR  12/16/13   BIH by Dr. Taiven Boston  . IR GENERIC HISTORICAL  02/11/2016   IR RADIOLOGIST EVAL & MGMT 02/11/2016 [facility]  . IR GENERIC HISTORICAL  02/15/2016   IR BONE TUMOR(S)RF ABLATION 02/15/2016 Luanne Bras, MD [facility]  . IR GENERIC HISTORICAL  02/15/2016   IR BONE TUMOR(S)RF ABLATION 02/15/2016 Luanne Bras, MD [facility]  . IR GENERIC HISTORICAL  02/15/2016   IR BONE TUMOR(S)RF ABLATION 02/15/2016 Luanne Bras, MD [facility]  . IR GENERIC HISTORICAL  02/15/2016   IR KYPHO THORACIC WITH BONE BIOPSY 02/15/2016 Luanne Bras, MD [facility]  . IR GENERIC HISTORICAL  02/15/2016   IR KYPHO THORACIC WITH BONE BIOPSY 02/15/2016 Luanne Bras, MD [facility]  . IR GENERIC HISTORICAL  02/15/2016   IR VERTEBROPLASTY CERV/THOR BX INC UNI/BIL INC/INJECT/IMAGING 02/15/2016 Luanne Bras, MD [facility]  . IR GENERIC HISTORICAL  03/13/2016   IR KYPHO EA ADDL LEVEL THORACIC OR LUMBAR 03/13/2016 Luanne Bras, MD [facility]  . IR GENERIC HISTORICAL  03/13/2016   IR KYPHO EA ADDL LEVEL THORACIC OR LUMBAR 03/13/2016 Luanne Bras, MD  [facility]  . IR GENERIC HISTORICAL  03/13/2016   IR BONE TUMOR(S)RF ABLATION 03/13/2016 Luanne Bras, MD [facility]  . IR GENERIC HISTORICAL  03/13/2016   IR KYPHO LUMBAR INC FX REDUCE BONE BX UNI/BIL  CANNULATION INC/IMAGING 03/13/2016 Luanne Bras, MD [facility]  . IR GENERIC HISTORICAL  03/13/2016   IR BONE TUMOR(S)RF ABLATION 03/13/2016 Luanne Bras, MD [facility]  . IR GENERIC HISTORICAL  03/13/2016   IR BONE TUMOR(S)RF ABLATION 03/13/2016 Luanne Bras, MD [facility]  . IR GENERIC HISTORICAL  03/31/2016   IR RADIOLOGIST EVAL & MGMT 03/31/2016 [facility]  . RADIOLOGY WITH ANESTHESIA N/A 02/15/2016   Procedure: Spinal Ablation;  Surgeon: Luanne Bras, MD;  Location: [facility];  Service: Radiology;  Laterality: N/A;  . RADIOLOGY WITH ANESTHESIA N/A 03/13/2016   Procedure: LUMBER ABLATION;  Surgeon: Luanne Bras, MD;  Location: [facility] [place];  Service: Radiology;  Laterality: N/A;  . ROTATOR CUFF REPAIR  2003  . TONSILLECTOMY    . WISDOM TOOTH EXTRACTION      FAMILY HISTORY: family history includes Allergies in his daughter; Cancer in his maternal aunt; Drug abuse in his brother; Healthy in his daughter and son; Heart disease in his father; Hypertension in his father; Leukemia in his paternal aunt; Multiple sclerosis in his sister; Paranoid behavior in his brother; Schizophrenia in his brother; Stroke in his maternal grandfather; Uterine cancer in his mother.  SOCIAL HISTORY:  reports that he quit smoking about 35 years ago. His smoking use included Cigarettes. He has a 7.00 pack-year smoking history. He has never used smokeless tobacco. He reports that he drinks alcohol. He reports that he does not use drugs.  ALLERGIES: Patient has no known allergies.  MEDICATIONS:  Current Outpatient Prescriptions  Medication Sig Dispense Refill  . acetaminophen (TYLENOL) 500 MG tablet Take 500 mg by mouth every 6 (six) hours as needed (for pain/fever.).     [name] carisoprodol (SOMA) 350 MG tablet Take 1 tablet (350 mg total) by mouth 4 (four) times daily as needed for muscle spasms. 90 tablet 3  . Cholecalciferol (VITAMIN D-3) 5000 units TABS Take 5,000 mg by mouth daily.    [name]  dexamethasone (DECADRON) 4 MG tablet Take 5 pills at one time with food every week (Patient taking differently: Take 20 mg by mouth every Wednesday. Take 5 pills at one time with food every week) 60 tablet 4  . famciclovir (FAMVIR) 500 MG tablet Take 1 tablet (500 mg total) by mouth daily. 30 tablet 8  . fentaNYL (DURAGESIC - DOSED MCG/HR) 50 MCG/HR Place 1 patch (50 mcg total) onto the skin every other day. 15 patch 0  . lenalidomide (REVLIMID) 25 MG capsule Take 1 capsule daily for 21 days on rthen 7 days off. Auth #[id number] 21 capsule 0  . magnesium oxide (MAG-OX) 400 MG tablet Take 400 mg by mouth daily.    . Oxycodone HCl 10 MG TABS Take 1 tablet (10 mg total) by mouth every 4 (four) hours as needed. 30 tablet 0  . phenazopyridine (PYRIDIUM) 200 MG tablet Take 1 tablet (200 mg total) by mouth 3 (three) times daily as needed for pain. 30 tablet 0  . polyethylene glycol powder (MIRALAX) powder Take 17 g by mouth daily. 850 g 3  . Probiotic Product (PROBIOTIC PO) Take 1 capsule by mouth daily.    . prochlorperazine (COMPAZINE) 10 MG tablet Take 1 tablet (10 mg total) by mouth every 6 (six) hours as needed for nausea or vomiting.  30 tablet 3  . rivaroxaban (XARELTO) 20 MG TABS tablet Take 1 tablet (20 mg total) by mouth daily with supper. 30 tablet 4  . senna (SENOKOT) 8.6 MG TABS tablet Take 2 tablets (17.2 mg total) by mouth daily. 60 each 2  . sertraline (ZOLOFT) 100 MG tablet Take 100 mg by mouth at bedtime.   2  . tamsulosin (FLOMAX) 0.4 MG CAPS capsule Take 0.4 mg by mouth daily.     [name] triamcinolone (KENALOG) 0.1 % paste Use as directed 1 application in the mouth or throat 2 (two) times daily. 5 g 12  . zolpidem (AMBIEN) 10 MG tablet Take 1 tablet (10 mg total) by mouth at bedtime as needed. (Patient taking differently: Take 5 mg by mouth at bedtime. ) 30 tablet 3  . HYDROcodone-acetaminophen (NORCO) 10-325 MG tablet Take 0.5-1 tablets by mouth every 8 (eight) hours as needed for moderate pain.  (Patient not taking: Reported on 06/02/2016) 60 tablet 0  . LORazepam (ATIVAN) 0.5 MG tablet Take 1 tablet (0.5 mg total) by mouth every 6 (six) hours as needed (Nausea or vomiting). (Patient not taking: Reported on 06/02/2016) 30 tablet 0   No current facility-administered medications for this encounter.     REVIEW OF SYSTEMS:  A 15 point review of systems is documented in the electronic medical record. This was obtained by the nursing staff. However, I reviewed this with the patient to discuss relevant findings and make appropriate changes.  Pertinent items noted in HPI and remainder of comprehensive ROS otherwise negative.   PHYSICAL EXAM:  height is _0  (1.778 m) and weight is 176 lb (79.8 kg). His oral temperature is 97.9 F (36.6 C). His blood pressure is 124/77 and his pulse is 72. His oxygen saturation is 97%.   General: Alert and oriented, in no acute distress HEENT: Head is normocephalic. Extraocular movements are intact. Oropharynx is clear. Neck: Neck is supple, no palpable cervical or supraclavicular lymphadenopathy. Heart: Regular in rate and rhythm with no murmurs, rubs, or gallops. Chest: Clear to auscultation bilaterally, with no rhonchi, wheezes, or rales. Abdomen: Soft, nontender, nondistended, with no rigidity or guarding. Extremities: No cyanosis or edema. Lymphatics: see Neck Exam Skin: No concerning lesions. Musculoskeletal: symmetric strength and muscle tone throughout. Neurologic: Cranial nerves II through XII are grossly intact. No obvious focalities. Speech is fluent. Coordination is intact. Psychiatric: Judgment and insight are intact. Affect is appropriate. Patient has point tenderness with palpation along the mid to lower thoracic spine area his flank pain is new and appears to be related to his kidney stone  ECOG = 1  LABORATORY DATA:  Lab Results  Component Value Date   WBC 4.0 05/28/2016   HGB 11.1 (L) 05/28/2016   HCT 33.3 (L) 05/28/2016   MCV 100  (H) 05/28/2016   PLT 213 05/28/2016   NEUTROABS 2.2 05/28/2016   Lab Results  Component Value Date   NA 138 05/28/2016   K 4.1 05/28/2016   CL 104 05/28/2016   CO2 26 05/28/2016   GLUCOSE 86 05/28/2016   CREATININE 0.9 05/28/2016   CALCIUM 8.8 05/28/2016      RADIOGRAPHY: Kub Day Of Procedure  Result Date: 05/30/2016 CLINICAL DATA:  Right renal stone. EXAM: ABDOMEN - 1 VIEW COMPARISON:  CT scan of May 12, 2016. FINDINGS: The bowel gas pattern is normal. Large rounded calculus is seen to the right of L2-3 disc space consistent with right ureteral calculus noted on prior CT scan. Status post kyphoplasty  extending from T11-L3. IMPRESSION: Large right ureteral calculus is noted. No evidence of bowel obstruction or ileus. Electronically Signed   By: Marijo Conception, M.D.   On: 05/30/2016 10:13   Ct Renal Stone Study  Result Date: 05/12/2016 CLINICAL DATA:  Acute onset of right flank pain with hematuria beginning yesterday. History of multiple myeloma and vertebral augmentation. EXAM: CT ABDOMEN AND PELVIS WITHOUT CONTRAST TECHNIQUE: Multidetector CT imaging of the abdomen and pelvis was performed following the standard protocol without IV contrast. COMPARISON:  Lumbar spine CT 03/07/2016.  Head CT 02/18/2016. FINDINGS: Lower chest: Large hiatal hernia out. Linear scarring and atelectasis at the lung bases. No pleural or pericardial fluid. Hepatobiliary: Normal without contrast. Pancreas: Normal Spleen: Normal Adrenals/Urinary Tract: Adrenal glands are normal. Left kidney is normal. 7 x 10 mm stone previously seen within the right renal collecting system is now passed to the right ureteropelvic junction. There is mild fullness the renal collecting system associated with this. Nonobstructing 1 mm stone in the right upper pole. Stomach/Bowel: No acute bowel findings. Vascular/Lymphatic: Aortic atherosclerosis. No aneurysm. IVC is normal. Reproductive: No significant finding. Calcification within  the penis not at the location of the urethra. Other: None significant Musculoskeletal: Widespread bone changes of myeloma. Previous augmentations from T11 through L3. Mild loss of height at L4 and L5 but without evidence of acute fracture. Chronic pars defects at L5. IMPRESSION: 10 x 7 mm stone previously seen in the right renal collecting system has passed to the right UPJ. Mild right hydro nephrosis. 1 mm nonobstructing stone in the right upper pole. Hiatal hernia. Widespread bone changes of myeloma without acute finding. Aortic atherosclerosis. Electronically Signed   By: Nelson Chimes M.D.   On: 05/12/2016 07:19      IMPRESSION: This is a 60 year old gentleman with  bony metastatic disease, as well as symptomatic metastases related to multiple myeloma. Since undergone several interventional radiology procedures as above but continues to have significant pain. The patient has several areas of involvement, but is particularly sensitive to pain in the mid to lower thoracic spine area. This patient is eligible for radiation therapy in conjunction with other treatments. I would not recommend treatment of the entire thoracic and lumbar spine given the patient's upcoming stem cell transplant so as to avoid significant bone marrow treatment.  PLAN: We discussed the risks, benefits, and side effects of radiation treatments. We discussed the logistics of radiotherapy for the patient's education. We also discussed the patient's upcoming stem cell transplant; this patient is an good candidate for radiation treatments in conjunction with stem cell treatment. The patient is interested in moving forward with radiation treatments and will be scheduled for a CT Simulation at 9 am today. Treatments will begin next week. We expect to complete 10 treatments targeted to the mid/lower thoracic spine area where he has point tenderness where his residual pain is located  Regarding the patient's exercise concerns, I encouraged him  to continue walking, but to avoid any heavy lifting or strenuous exercise.      ------------------------------------------------  Blair Promise, PhD, MD   This document serves as a record of services personally performed by Gery Pray, MD. It was created on his behalf by Maryla Morrow, a trained medical scribe. The creation of this record is based on the scribe's personal observations and the provider's statements to them. This document has been checked and approved by the attending provider.

## 2016-06-02 NOTE — Progress Notes (Signed)
Please see the Nurse Progress Note in the MD Initial Consult Encounter for this patient. 

## 2016-06-03 ENCOUNTER — Telehealth: Payer: Self-pay | Admitting: *Deleted

## 2016-06-03 NOTE — Telephone Encounter (Signed)
Patient had surgery last week in an attempt to remove a kidney stone. He currently has a stent. He has had some problems with post op bleeding and his Xarelto has been on hold. He is due to restart today, but wanted to know what [name] thought. He has an appointment tomorrow.   Spoke with [name] and he would like patient to hold Xarelto until seen in the office tomorrow. Patient's wife aware of instruction. Will hold Xarelto until they can be seen tomorrow.

## 2016-06-04 ENCOUNTER — Ambulatory Visit (HOSPITAL_BASED_OUTPATIENT_CLINIC_OR_DEPARTMENT_OTHER): Payer: 59 | Admitting: Hematology & Oncology

## 2016-06-04 ENCOUNTER — Ambulatory Visit (HOSPITAL_BASED_OUTPATIENT_CLINIC_OR_DEPARTMENT_OTHER)
Admission: RE | Admit: 2016-06-04 | Discharge: 2016-06-04 | Disposition: A | Discharge status: Home / Self Care | Payer: 59 | Source: Ambulatory Visit | Attending: Hematology & Oncology | Admitting: Hematology & Oncology

## 2016-06-04 ENCOUNTER — Ambulatory Visit (HOSPITAL_BASED_OUTPATIENT_CLINIC_OR_DEPARTMENT_OTHER): Payer: 59

## 2016-06-04 ENCOUNTER — Other Ambulatory Visit (HOSPITAL_BASED_OUTPATIENT_CLINIC_OR_DEPARTMENT_OTHER): Payer: 59

## 2016-06-04 VITALS — BP 133/76 | HR 71 | Temp 98.1°F | Wt 173.0 lb

## 2016-06-04 DIAGNOSIS — N200 Calculus of kidney: Secondary | ICD-10-CM

## 2016-06-04 DIAGNOSIS — C9000 Multiple myeloma not having achieved remission: Secondary | ICD-10-CM

## 2016-06-04 DIAGNOSIS — Z5112 Encounter for antineoplastic immunotherapy: Secondary | ICD-10-CM

## 2016-06-04 DIAGNOSIS — G8929 Other chronic pain: Secondary | ICD-10-CM

## 2016-06-04 DIAGNOSIS — D472 Monoclonal gammopathy: Secondary | ICD-10-CM

## 2016-06-04 DIAGNOSIS — Z510 Encounter for antineoplastic radiation therapy: Secondary | ICD-10-CM | POA: Diagnosis not present

## 2016-06-04 DIAGNOSIS — M545 Low back pain: Secondary | ICD-10-CM

## 2016-06-04 LAB — CMP (CANCER CENTER ONLY)
ALBUMIN: 3.7 g/dL (ref 3.3–5.5)
ALT(SGPT): 36 U/L (ref 10–47)
AST: 31 U/L (ref 11–38)
Alkaline Phosphatase: 58 U/L (ref 26–84)
BILIRUBIN TOTAL: 0.7 mg/dL (ref 0.20–1.60)
BUN: 11 mg/dL (ref 7–22)
CHLORIDE: 103 meq/L (ref 98–108)
CO2: 27 meq/L (ref 18–33)
CREATININE: 1 mg/dL (ref 0.6–1.2)
Calcium: 9.1 mg/dL (ref 8.0–10.3)
Glucose, Bld: 110 mg/dL (ref 73–118)
Potassium: 3.9 mEq/L (ref 3.3–4.7)
SODIUM: 137 meq/L (ref 128–145)
Total Protein: 6.9 g/dL (ref 6.4–8.1)

## 2016-06-04 LAB — CBC WITH DIFFERENTIAL (CANCER CENTER ONLY)
BASO#: 0 10*3/uL (ref 0.0–0.2)
BASO%: 0.2 % (ref 0.0–2.0)
EOS%: 5.1 % (ref 0.0–7.0)
Eosinophils Absolute: 0.2 10*3/uL (ref 0.0–0.5)
HCT: 36.7 % — ABNORMAL LOW (ref 38.7–49.9)
HEMOGLOBIN: 12.3 g/dL — AB (ref 13.0–17.1)
LYMPH#: 1.2 10*3/uL (ref 0.9–3.3)
LYMPH%: 27.7 % (ref 14.0–48.0)
MCH: 32.9 pg (ref 28.0–33.4)
MCHC: 33.5 g/dL (ref 32.0–35.9)
MCV: 98 fL (ref 82–98)
MONO#: 0.7 10*3/uL (ref 0.1–0.9)
MONO%: 15.6 % — AB (ref 0.0–13.0)
NEUT%: 51.4 % (ref 40.0–80.0)
NEUTROS ABS: 2.2 10*3/uL (ref 1.5–6.5)
PLATELETS: 199 10*3/uL (ref 145–400)
RBC: 3.74 10*6/uL — AB (ref 4.20–5.70)
RDW: 14.9 % (ref 11.1–15.7)
WBC: 4.3 10*3/uL (ref 4.0–10.0)

## 2016-06-04 LAB — LACTATE DEHYDROGENASE: LDH: 161 U/L (ref 125–245)

## 2016-06-04 MED ORDER — PROCHLORPERAZINE MALEATE 10 MG PO TABS
ORAL_TABLET | ORAL | Status: AC
  Filled 2016-06-04: qty 1

## 2016-06-04 MED ORDER — BORTEZOMIB CHEMO SQ INJECTION 3.5 MG (2.5MG/ML)
1.3000 mg/m2 | Freq: Once | INTRAMUSCULAR | Status: AC
  Administered 2016-06-04: 2.5 mg via SUBCUTANEOUS
  Filled 2016-06-04: qty 2.5

## 2016-06-04 MED ORDER — PROCHLORPERAZINE MALEATE 10 MG PO TABS
10.0000 mg | ORAL_TABLET | Freq: Once | ORAL | Status: AC
  Administered 2016-06-04: 10 mg via ORAL

## 2016-06-04 NOTE — Patient Instructions (Signed)

## 2016-06-04 NOTE — Progress Notes (Signed)
Hematology and Oncology Follow Up Visit  Jori Thrall [id number] December 23, 1955 60 y.o. 06/04/2016   Principle Diagnosis:  IgG Kappa myeloma - Hyperdiploid/+11 DVT of the LEFT and RIGHT leg Current Therapy:    S/p cycle #4 of RVD  Zometa 4 mg IV q month Xarelto 20 mg po q day - started on 03/21/2016 Palliative XRT to the back - start on 06/09/2016     Interim History:  Mr. Trumbull is back for follow-up. Unfortunately, he's been having problems with a kidney stone. He saw urology. They tried to remove the kidney stone but could not. This is in his right kidney. He had a stent placed. He may need to have surgery to actually remove this.  He is currently off Xarelto right now. Whenever he is on Xarelto, he has hematuria.  I'm repeating his Dopplers today. We will see if he still has thrombus in his legs. If so, then I may put a retrievable IVC filter and to get into any kind of invasive procedure that he will need for his kidney stones.  Thankfully, he is responded incredibly well to treatment. His last M spike was down to 0.9 g/dL. His IgG level was down to 1105 milligrams per deciliter. His Kappa Lightchain was 21 mg/L.  He really is on track right now. I would like to try to get his M spike down to 0 of possible before any type of transplant.  He will start radiation therapy to his back next week. He'll have 2 weeks of treatment. This will be to try to help improve the pain that he has from myeloma involvement.  Of note, we did do hypercoagulable studies on him to make sure there is no underlying thrombophilic cause for the thrombo-embolic disease in his legs. His studies all came back negative.  His appetite is doing okay. He's had no nausea or vomiting. He has had a little bit of constipation from the pain medications.  He's had no issues with rashes. He's had no mouth sores.  Overall, his performance status is ECOG 1.   Medications:  Current Outpatient Prescriptions:  .   acetaminophen (TYLENOL) 500 MG tablet, Take 500 mg by mouth every 6 (six) hours as needed (for pain/fever.). , Disp: , Rfl:  .  carisoprodol (SOMA) 350 MG tablet, Take 1 tablet (350 mg total) by mouth 4 (four) times daily as needed for muscle spasms., Disp: 90 tablet, Rfl: 3 .  Cholecalciferol (VITAMIN D-3) 5000 units TABS, Take 5,000 mg by mouth daily., Disp: , Rfl:  .  dexamethasone (DECADRON) 4 MG tablet, Take 5 pills at one time with food every week (Patient taking differently: Take 20 mg by mouth every Wednesday. Take 5 pills at one time with food every week), Disp: 60 tablet, Rfl: 4 .  famciclovir (FAMVIR) 500 MG tablet, Take 1 tablet (500 mg total) by mouth daily., Disp: 30 tablet, Rfl: 8 .  fentaNYL (DURAGESIC - DOSED MCG/HR) 50 MCG/HR, Place 1 patch (50 mcg total) onto the skin every other day., Disp: 15 patch, Rfl: 0 .  HYDROcodone-acetaminophen (NORCO) 10-325 MG tablet, Take 0.5-1 tablets by mouth every 8 (eight) hours as needed for moderate pain., Disp: 60 tablet, Rfl: 0 .  lenalidomide (REVLIMID) 25 MG capsule, Take 1 capsule daily for 21 days on rthen 7 days off. Josem Kaufmann #[id number], Disp: 21 capsule, Rfl: 0 .  LORazepam (ATIVAN) 0.5 MG tablet, Take 1 tablet (0.5 mg total) by mouth every 6 (six) hours as needed (Nausea or vomiting).,  Disp: 30 tablet, Rfl: 0 .  magnesium oxide (MAG-OX) 400 MG tablet, Take 400 mg by mouth daily., Disp: , Rfl:  .  ondansetron (ZOFRAN-ODT) 8 MG disintegrating tablet, Take 8 mg by mouth every 8 (eight) hours as needed., Disp: , Rfl: 1 .  Oxycodone HCl 10 MG TABS, Take 1 tablet (10 mg total) by mouth every 4 (four) hours as needed., Disp: 30 tablet, Rfl: 0 .  phenazopyridine (PYRIDIUM) 200 MG tablet, Take 1 tablet (200 mg total) by mouth 3 (three) times daily as needed for pain., Disp: 30 tablet, Rfl: 0 .  polyethylene glycol powder (MIRALAX) powder, Take 17 g by mouth daily., Disp: 850 g, Rfl: 3 .  Probiotic Product (PROBIOTIC PO), Take 1 capsule by mouth daily.,  Disp: , Rfl:  .  prochlorperazine (COMPAZINE) 10 MG tablet, Take 1 tablet (10 mg total) by mouth every 6 (six) hours as needed for nausea or vomiting., Disp: 30 tablet, Rfl: 3 .  rivaroxaban (XARELTO) 20 MG TABS tablet, Take 1 tablet (20 mg total) by mouth daily with supper., Disp: 30 tablet, Rfl: 4 .  senna (SENOKOT) 8.6 MG TABS tablet, Take 2 tablets (17.2 mg total) by mouth daily., Disp: 60 each, Rfl: 2 .  sertraline (ZOLOFT) 100 MG tablet, Take 100 mg by mouth at bedtime. , Disp: , Rfl: 2 .  tamsulosin (FLOMAX) 0.4 MG CAPS capsule, Take 0.4 mg by mouth daily. , Disp: , Rfl:  .  triamcinolone (KENALOG) 0.1 % paste, Use as directed 1 application in the mouth or throat 2 (two) times daily., Disp: 5 g, Rfl: 12 .  zolpidem (AMBIEN) 10 MG tablet, Take 1 tablet (10 mg total) by mouth at bedtime as needed. (Patient taking differently: Take 5 mg by mouth at bedtime. ), Disp: 30 tablet, Rfl: 3  Allergies: No Known Allergies  Past Medical History, Surgical history, Social history, and Family History were reviewed and updated.  Review of Systems: As above  Physical Exam:  weight is 173 lb (78.5 kg). His oral temperature is 98.1 F (36.7 C). His blood pressure is 133/76 and his pulse is 71.   Wt Readings from Last 3 Encounters:  06/04/16 173 lb (78.5 kg)  06/02/16 176 lb (79.8 kg)  05/30/16 174 lb (78.9 kg)     Head and neck exam shows no ocular or oral lesions. He has no palpable cervical or supraclavicular lymph nodes. He has no palpable thyroid. Lungs are clear bilaterally. Cardiac exam regular rate and rhythm with no murmurs, rubs or bruits. Abdomen is soft. He has good bowel sounds. There is no fluid wave. There is no palpable liver or spleen tip. Back exam shows some tenderness in the midthoracic spine. There is some tenderness along the right thoracic paravertebral muscles. Extremities shows no clubbing, cyanosis or edema. He has good range of motion of his joints. He has good strength in  his extremities. Neurological exam shows no focal neurological deficits. Skin exam shows no rashes, ecchymoses or petechia.   Lab Results  Component Value Date   WBC 4.3 06/04/2016   HGB 12.3 (L) 06/04/2016   HCT 36.7 (L) 06/04/2016   MCV 98 06/04/2016   PLT 199 06/04/2016     Chemistry      Component Value Date/Time   NA 137 06/04/2016 0807   NA 136 02/22/2016 0804   K 3.9 06/04/2016 0807   K 3.8 02/22/2016 0804   CL 103 06/04/2016 0807   CO2 27 06/04/2016 0807   CO2  22 02/22/2016 0804   BUN 11 06/04/2016 0807   BUN 16.4 02/22/2016 0804   CREATININE 1.0 06/04/2016 0807   CREATININE 1.0 02/22/2016 0804      Component Value Date/Time   CALCIUM 9.1 06/04/2016 0807   CALCIUM 9.8 02/22/2016 0804   ALKPHOS 58 06/04/2016 0807   ALKPHOS 95 02/22/2016 0804   AST 31 06/04/2016 0807   AST 19 02/22/2016 0804   ALT 36 06/04/2016 0807   ALT 24 02/22/2016 0804   BILITOT 0.70 06/04/2016 0807   BILITOT 0.41 02/22/2016 0804         Impression and Plan: Mr. Woolum is a 60 year old white male. He actually looks to be in good shape. He has IgG Kappa Myeloma.  As long as he is responding to treatment, we will continue him on the RVD protocol. This seems to be working quite well.   We will see her with a Dopplers showed a day. Again hopefully they will show that his thromboembolic disease is gone. If so, then we might be able to keep him off anticoagulation until after he has his right nephrolithiasis taking care of. He sees the urologist on Monday the 27th.  He and his wife are going to [place] in late December after Christmas. I told him that it would be safe for him to go. He does not need to wear a mask on the plane. I would not think that his immune system should be all that compromised.  We spent about 30 minutes with him today. The kidney stone issue is certainly an added factor that complicates his care.   I will plan to see him back myself in another month.    [name], MD 11/22/201710:49 AM

## 2016-06-06 ENCOUNTER — Other Ambulatory Visit: Payer: Self-pay | Admitting: Nurse Practitioner

## 2016-06-06 DIAGNOSIS — C9000 Multiple myeloma not having achieved remission: Secondary | ICD-10-CM

## 2016-06-06 DIAGNOSIS — D472 Monoclonal gammopathy: Secondary | ICD-10-CM

## 2016-06-06 LAB — KAPPA/LAMBDA LIGHT CHAINS
IG KAPPA FREE LIGHT CHAIN: 22.1 mg/L — AB (ref 3.3–19.4)
IG LAMBDA FREE LIGHT CHAIN: 8.3 mg/L (ref 5.7–26.3)
Kappa/Lambda FluidC Ratio: 2.66 — ABNORMAL HIGH (ref 0.26–1.65)

## 2016-06-06 MED ORDER — LENALIDOMIDE 25 MG PO CAPS: ORAL_CAPSULE | ORAL | 0 refills | Status: DC

## 2016-06-09 ENCOUNTER — Ambulatory Visit
Admission: RE | Admit: 2016-06-09 | Discharge: 2016-06-09 | Disposition: A | Discharge status: Home / Self Care | Payer: 59 | Source: Ambulatory Visit | Attending: Radiation Oncology | Admitting: Radiation Oncology

## 2016-06-09 ENCOUNTER — Encounter: Payer: Self-pay | Admitting: *Deleted

## 2016-06-09 DIAGNOSIS — Z510 Encounter for antineoplastic radiation therapy: Secondary | ICD-10-CM | POA: Diagnosis not present

## 2016-06-09 LAB — MULTIPLE MYELOMA PANEL, SERUM
Albumin SerPl Elph-Mcnc: 3.6 g/dL (ref 2.9–4.4)
Albumin/Glob SerPl: 1.3 (ref 0.7–1.7)
Alpha 1: 0.3 g/dL (ref 0.0–0.4)
Alpha2 Glob SerPl Elph-Mcnc: 0.8 g/dL (ref 0.4–1.0)
B-GLOBULIN SERPL ELPH-MCNC: 0.9 g/dL (ref 0.7–1.3)
GAMMA GLOB SERPL ELPH-MCNC: 0.8 g/dL (ref 0.4–1.8)
GLOBULIN, TOTAL: 2.8 g/dL (ref 2.2–3.9)
IGA/IMMUNOGLOBULIN A, SERUM: 30 mg/dL — AB (ref 90–386)
IgG, Qn, Serum: 802 mg/dL (ref 700–1600)
IgM, Qn, Serum: 17 mg/dL — ABNORMAL LOW (ref 20–172)
M PROTEIN SERPL ELPH-MCNC: 0.6 g/dL — AB
Total Protein: 6.4 g/dL (ref 6.0–8.5)

## 2016-06-10 ENCOUNTER — Encounter: Payer: Self-pay | Admitting: Radiation Oncology

## 2016-06-10 ENCOUNTER — Ambulatory Visit
Admission: RE | Admit: 2016-06-10 | Discharge: 2016-06-10 | Disposition: A | Discharge status: Home / Self Care | Payer: 59 | Source: Ambulatory Visit | Attending: Radiation Oncology | Admitting: Radiation Oncology

## 2016-06-10 VITALS — BP 129/75 | HR 74 | Temp 98.1°F | Wt 176.8 lb

## 2016-06-10 DIAGNOSIS — C9000 Multiple myeloma not having achieved remission: Secondary | ICD-10-CM

## 2016-06-10 DIAGNOSIS — Z510 Encounter for antineoplastic radiation therapy: Secondary | ICD-10-CM | POA: Diagnosis not present

## 2016-06-10 NOTE — Progress Notes (Signed)
Mr. Bossier presents after his 2nd fraction of radiation to his Thoracic Spine. He reports pain is currently a 5/10, but is a 8/10 when on the table receiving radiation. He is using a 50 mcg Fentanyl patch, and has oxycodone 10 mg to use as needed. He tells me he has used this once over the past two days. He is taking decadron 20 mg every Wednesday. He reports eating well. He reports constipation, but miralax and sennakot provide relief.   BP 129/75   Pulse 74   Temp 98.1 F (36.7 C)   Wt 176 lb 12.8 oz (80.2 kg)   SpO2 99% Comment: room air  BMI 25.37 kg/m    Wt Readings from Last 3 Encounters:  06/10/16 176 lb 12.8 oz (80.2 kg)  06/04/16 173 lb (78.5 kg)  06/02/16 176 lb (79.8 kg)

## 2016-06-10 NOTE — Progress Notes (Signed)
  Radiation Oncology         [phone] ________________________________  Name: Jorge [name] MRN: [id number]  Date: 06/10/2016  DOB: 1956/03/11  Weekly Radiation Therapy Management    ICD-9-CM ICD-10-CM   1. Multiple myeloma not having achieved remission ([place]) 203.00 C90.00      Current Dose: 5 Gy     Planned Dose:  25 Gy  Narrative . . . . . . . . The patient presents for routine under treatment assessment.  The patient has completed 2 fractions to his thoracic spine. He reports his pain is currently 5/10 in severity, but is 8/10 when on the table receiving radiation. He is using a 50 mcg Fentanyl patch and has oxycodone 10 mg to use as needed. He is taking decadron 20 mg every Wednesday. He reports eating well. The patient reports constipation, but Miralax and Senokot provide relief. The patient's wife is in attendance today.                                   The patient is without complaint.                                 Set-up films were reviewed.                                 The chart was checked. Physical Findings. . .  weight is 176 lb 12.8 oz (80.2 kg). His temperature is 98.1 F (36.7 C). His blood pressure is 129/75 and his pulse is 74. His oxygen saturation is 99%. . Weight essentially stable.  No significant changes. Cardiopulmonary assessment is negative for acute distress and he exhibits normal effort.  Impression . . . . . . . The patient is tolerating radiation. Plan . . . . . . . . . . . . Continue treatment as planned.  ________________________________   Blair Promise, PhD, MD  This document serves as a record of services personally performed by Gery Pray, MD. It was created on his behalf by Maryla Morrow, a trained medical scribe. The creation of this record is based on the scribe's personal observations and the provider's statements to them. This document has been checked and approved by the attending provider.

## 2016-06-11 ENCOUNTER — Other Ambulatory Visit: Payer: Self-pay | Admitting: *Deleted

## 2016-06-11 ENCOUNTER — Ambulatory Visit
Admission: RE | Admit: 2016-06-11 | Discharge: 2016-06-11 | Disposition: A | Discharge status: Home / Self Care | Payer: 59 | Source: Ambulatory Visit | Attending: Radiation Oncology | Admitting: Radiation Oncology

## 2016-06-11 DIAGNOSIS — D472 Monoclonal gammopathy: Secondary | ICD-10-CM

## 2016-06-11 DIAGNOSIS — M545 Low back pain: Secondary | ICD-10-CM

## 2016-06-11 DIAGNOSIS — C9000 Multiple myeloma not having achieved remission: Secondary | ICD-10-CM

## 2016-06-11 DIAGNOSIS — G8929 Other chronic pain: Secondary | ICD-10-CM

## 2016-06-11 DIAGNOSIS — Z510 Encounter for antineoplastic radiation therapy: Secondary | ICD-10-CM | POA: Diagnosis not present

## 2016-06-11 MED ORDER — MAGIC MOUTHWASH W/LIDOCAINE: 5.0000 mL | Freq: Three times a day (TID) | ORAL | 3 refills | Status: DC | PRN

## 2016-06-11 MED ORDER — FENTANYL 50 MCG/HR TD PT72: 50.0000 ug | MEDICATED_PATCH | TRANSDERMAL | 0 refills | Status: DC

## 2016-06-11 NOTE — Progress Notes (Signed)
Pt here for patient teaching.  Pt given Radiation and You booklet.  Reviewed areas of pertinence such as diarrhea, fatigue, nausea and vomiting, skin changes and throat changes . Pt able to give teach back of to pat skin, use unscented/gentle soap, have Imodium on hand and drink plenty of water,avoid applying anything to skin within 4 hours of treatment. Pt demonstrated understanding and verbalizes understanding of information given and will contact nursing with any questions or concerns.             

## 2016-06-12 ENCOUNTER — Ambulatory Visit
Admission: RE | Admit: 2016-06-12 | Discharge: 2016-06-12 | Disposition: A | Discharge status: Home / Self Care | Payer: 59 | Source: Ambulatory Visit | Attending: Radiation Oncology | Admitting: Radiation Oncology

## 2016-06-12 ENCOUNTER — Other Ambulatory Visit: Payer: Self-pay | Admitting: Urology

## 2016-06-12 DIAGNOSIS — Z510 Encounter for antineoplastic radiation therapy: Secondary | ICD-10-CM | POA: Diagnosis not present

## 2016-06-13 ENCOUNTER — Ambulatory Visit
Admission: RE | Admit: 2016-06-13 | Discharge: 2016-06-13 | Disposition: A | Discharge status: Home / Self Care | Payer: 59 | Source: Ambulatory Visit | Attending: Radiation Oncology | Admitting: Radiation Oncology

## 2016-06-13 ENCOUNTER — Telehealth: Payer: Self-pay | Admitting: *Deleted

## 2016-06-13 DIAGNOSIS — Z510 Encounter for antineoplastic radiation therapy: Secondary | ICD-10-CM | POA: Diagnosis not present

## 2016-06-13 MED ORDER — CEFDINIR 300 MG PO CAPS: 600.0000 mg | ORAL_CAPSULE | Freq: Every day | ORAL | 0 refills | Status: DC

## 2016-06-13 NOTE — Telephone Encounter (Signed)
Patient is c/o chest congestion with productive cough. He'd like to know if he can take any OTC medication to help him with symptoms. He has NO fever.   [name] states that patient can treat his symptoms using any OTC medication. He also wants the patient to start an antibiotic.   Patient aware of new prescription and instructions on OTC meds.

## 2016-06-16 ENCOUNTER — Ambulatory Visit
Admission: RE | Admit: 2016-06-16 | Discharge: 2016-06-16 | Disposition: A | Discharge status: Home / Self Care | Payer: 59 | Source: Ambulatory Visit | Attending: Radiation Oncology | Admitting: Radiation Oncology

## 2016-06-16 ENCOUNTER — Telehealth: Payer: Self-pay | Admitting: Oncology

## 2016-06-16 DIAGNOSIS — Z510 Encounter for antineoplastic radiation therapy: Secondary | ICD-10-CM | POA: Diagnosis not present

## 2016-06-16 MED ORDER — SUCRALFATE 1 GM/10ML PO SUSP: 1.0000 g | Freq: Three times a day (TID) | ORAL | 0 refills | Status: DC

## 2016-06-16 NOTE — Telephone Encounter (Signed)
Received message from patient's wife that he has a sore throat and is wondering if medication can be sent in.  Called back and left a message with Mr. [place] for a return call.

## 2016-06-16 NOTE — Telephone Encounter (Signed)
Jorge [name] called back.  He said his throat is sore when he is eating and then also at bedtime.  Advised him that a prescription for Carafate has been sent in to his pharmacy.  Told him to take the carafate 15-20 minutes before a meal and at bedtime and that we will check on him tomorrow.  He verbalized understanding and agreement.

## 2016-06-17 ENCOUNTER — Encounter: Payer: Self-pay | Admitting: Radiation Oncology

## 2016-06-17 ENCOUNTER — Ambulatory Visit
Admission: RE | Admit: 2016-06-17 | Discharge: 2016-06-17 | Disposition: A | Discharge status: Home / Self Care | Payer: 59 | Source: Ambulatory Visit | Attending: Radiation Oncology | Admitting: Radiation Oncology

## 2016-06-17 VITALS — BP 126/83 | HR 83 | Temp 97.8°F | Ht 70.0 in | Wt 170.8 lb

## 2016-06-17 DIAGNOSIS — C9000 Multiple myeloma not having achieved remission: Secondary | ICD-10-CM

## 2016-06-17 DIAGNOSIS — Z510 Encounter for antineoplastic radiation therapy: Secondary | ICD-10-CM | POA: Diagnosis not present

## 2016-06-17 NOTE — Progress Notes (Signed)
  Radiation Oncology         [phone]) [phone] ________________________________  Name: Jorge [name] MRN: [id number]  Date: 06/17/2016  DOB: January 15, 1956  Weekly Radiation Therapy Management    ICD-9-CM ICD-10-CM   1. Multiple myeloma not having achieved remission ([place]) 203.00 C90.00      Current Dose: 17.5 Gy     Planned Dose:  25 Gy  Narrative . . . . . . . . The patient presents for routine under treatment assessment.  The patient has completed 7 fractions to his thoracic spine. He reports his pain level is 4/10 in severity in his mid back. He reports there is a spot to the left of his spine between his shoulder blades that was a 10/10 in severity when he laid on the treatment table today. He is using a Fentanyl patch and norco for breakthrough pain. He reports having a sore throat when swallowing and took Carafate last night with improvement. The patient reports coughing frequently, and is taking omnicef. He reports feeling very fatigued. He is also having nausea, for which he takes Compazine. He will have kidney stone surgery on Friday 06/20/16 and wants to move his last treatment to Monday 06/23/16 if possible.                                    The patient is without complaint.                                 Set-up films were reviewed.                                 The chart was checked. Physical Findings. . .  height is _0  (1.778 m) and weight is 170 lb 12.8 oz (77.5 kg). His oral temperature is 97.8 F (36.6 C). His blood pressure is 126/83 and his pulse is 83. His oxygen saturation is 100%. . Weight essentially stable.  No significant changes. Cardiopulmonary assessment is negative for acute distress and he exhibits normal effort.  Impression . . . . . . . The patient is tolerating radiation. Plan . . . . . . . . . . . . Continue treatment as planned. The patient will return for routine follow up 1 month after completion of his radiation  treatment.  ________________________________   Blair Promise, PhD, MD  This document serves as a record of services personally performed by Gery Pray, MD. It was created on his behalf by Maryla Morrow, a trained medical scribe. The creation of this record is based on the scribe's personal observations and the provider's statements to them. This document has been checked and approved by the attending provider.

## 2016-06-17 NOTE — Progress Notes (Signed)
Jorge [name] has 7 fractions to his thoracic spine.  He reports his pain level is a 4/10 in his mid back.  He said there is a spot to the left of his spine between his shoulder blades that was a 10/10 when he laid on the table today.  He is using a fentanyl patch and norco for breath through pain.  He reports having a sore throat when swallowing and took Carafate last night and it is better today.  He is coughing frequently and is taking omnicef.  He reports feeling very fatigued.  He is also having nausea and is taking compazine.  He will have kidney stone surgery on Friday and wants to move his last treatment to Monday if possible.  BP 126/83 (BP Location: Left Arm, Patient Position: Sitting)   Pulse 83   Temp 97.8 F (36.6 C) (Oral)   Ht 5\' 10"  (1.778 m)   Wt 170 lb 12.8 oz (77.5 kg)   SpO2 100%   BMI 24.51 kg/m    Wt Readings from Last 3 Encounters:  06/17/16 170 lb 12.8 oz (77.5 kg)  06/10/16 176 lb 12.8 oz (80.2 kg)  06/04/16 173 lb (78.5 kg)

## 2016-06-18 ENCOUNTER — Ambulatory Visit (HOSPITAL_BASED_OUTPATIENT_CLINIC_OR_DEPARTMENT_OTHER): Payer: 59 | Admitting: Family

## 2016-06-18 ENCOUNTER — Ambulatory Visit
Admission: RE | Admit: 2016-06-18 | Discharge: 2016-06-18 | Disposition: A | Discharge status: Home / Self Care | Payer: 59 | Source: Ambulatory Visit | Attending: Radiation Oncology | Admitting: Radiation Oncology

## 2016-06-18 ENCOUNTER — Ambulatory Visit: Payer: 59

## 2016-06-18 ENCOUNTER — Other Ambulatory Visit (HOSPITAL_BASED_OUTPATIENT_CLINIC_OR_DEPARTMENT_OTHER): Payer: 59

## 2016-06-18 ENCOUNTER — Ambulatory Visit (HOSPITAL_BASED_OUTPATIENT_CLINIC_OR_DEPARTMENT_OTHER)
Admission: RE | Admit: 2016-06-18 | Discharge: 2016-06-18 | Disposition: A | Discharge status: Home / Self Care | Payer: 59 | Source: Ambulatory Visit | Attending: Family | Admitting: Family

## 2016-06-18 ENCOUNTER — Other Ambulatory Visit: Payer: Self-pay | Admitting: Family

## 2016-06-18 VITALS — BP 116/72 | HR 82 | Temp 98.2°F | Wt 171.8 lb

## 2016-06-18 DIAGNOSIS — Z510 Encounter for antineoplastic radiation therapy: Secondary | ICD-10-CM | POA: Diagnosis not present

## 2016-06-18 DIAGNOSIS — C9000 Multiple myeloma not having achieved remission: Secondary | ICD-10-CM

## 2016-06-18 DIAGNOSIS — R059 Cough, unspecified: Secondary | ICD-10-CM

## 2016-06-18 DIAGNOSIS — J9811 Atelectasis: Secondary | ICD-10-CM | POA: Insufficient documentation

## 2016-06-18 DIAGNOSIS — K449 Diaphragmatic hernia without obstruction or gangrene: Secondary | ICD-10-CM | POA: Insufficient documentation

## 2016-06-18 DIAGNOSIS — Z5112 Encounter for antineoplastic immunotherapy: Secondary | ICD-10-CM | POA: Diagnosis not present

## 2016-06-18 DIAGNOSIS — R05 Cough: Secondary | ICD-10-CM

## 2016-06-18 DIAGNOSIS — J22 Unspecified acute lower respiratory infection: Secondary | ICD-10-CM

## 2016-06-18 LAB — CBC WITH DIFFERENTIAL (CANCER CENTER ONLY)
BASO#: 0 10e3/uL (ref 0.0–0.2)
BASO%: 1 % (ref 0.0–2.0)
EOS%: 3.3 % (ref 0.0–7.0)
Eosinophils Absolute: 0.1 10e3/uL (ref 0.0–0.5)
HCT: 36.2 % — ABNORMAL LOW (ref 38.7–49.9)
HGB: 12.2 g/dL — ABNORMAL LOW (ref 13.0–17.1)
LYMPH#: 0.7 10e3/uL — ABNORMAL LOW (ref 0.9–3.3)
LYMPH%: 21.4 % (ref 14.0–48.0)
MCH: 32.4 pg (ref 28.0–33.4)
MCHC: 33.7 g/dL (ref 32.0–35.9)
MCV: 96 fL (ref 82–98)
MONO#: 0.5 10e3/uL (ref 0.1–0.9)
MONO%: 16.4 % — ABNORMAL HIGH (ref 0.0–13.0)
NEUT#: 1.8 10e3/uL (ref 1.5–6.5)
NEUT%: 57.9 % (ref 40.0–80.0)
Platelets: 226 10e3/uL (ref 145–400)
RBC: 3.76 10e6/uL — ABNORMAL LOW (ref 4.20–5.70)
RDW: 14.8 % (ref 11.1–15.7)
WBC: 3 10e3/uL — ABNORMAL LOW (ref 4.0–10.0)

## 2016-06-18 LAB — CMP (CANCER CENTER ONLY)
ALT(SGPT): 26 U/L (ref 10–47)
AST: 30 U/L (ref 11–38)
Albumin: 3.7 g/dL (ref 3.3–5.5)
Alkaline Phosphatase: 59 U/L (ref 26–84)
BUN, Bld: 10 mg/dL (ref 7–22)
CO2: 22 meq/L (ref 18–33)
Calcium: 8.9 mg/dL (ref 8.0–10.3)
Chloride: 108 meq/L (ref 98–108)
Creat: 1 mg/dL (ref 0.6–1.2)
Glucose, Bld: 152 mg/dL — ABNORMAL HIGH (ref 73–118)
Potassium: 3.8 meq/L (ref 3.3–4.7)
Sodium: 136 meq/L (ref 128–145)
Total Bilirubin: 0.6 mg/dL (ref 0.20–1.60)
Total Protein: 6.6 g/dL (ref 6.4–8.1)

## 2016-06-18 MED ORDER — PROCHLORPERAZINE MALEATE 10 MG PO TABS
10.0000 mg | ORAL_TABLET | Freq: Once | ORAL | Status: AC
  Administered 2016-06-18: 10 mg via ORAL

## 2016-06-18 MED ORDER — BENZONATATE 100 MG PO CAPS: 100.0000 mg | ORAL_CAPSULE | Freq: Three times a day (TID) | ORAL | 0 refills | Status: DC | PRN

## 2016-06-18 MED ORDER — PROCHLORPERAZINE MALEATE 10 MG PO TABS
ORAL_TABLET | ORAL | Status: AC
  Filled 2016-06-18: qty 1

## 2016-06-18 MED ORDER — SODIUM CHLORIDE 0.9 % IV SOLN
Freq: Once | INTRAVENOUS | Status: AC
  Administered 2016-06-18: 09:00:00 via INTRAVENOUS

## 2016-06-18 MED ORDER — ZOLEDRONIC ACID 4 MG/100ML IV SOLN
4.0000 mg | Freq: Once | INTRAVENOUS | Status: AC
  Administered 2016-06-18: 4 mg via INTRAVENOUS
  Filled 2016-06-18: qty 100

## 2016-06-18 MED ORDER — BORTEZOMIB CHEMO SQ INJECTION 3.5 MG (2.5MG/ML)
1.3000 mg/m2 | Freq: Once | INTRAMUSCULAR | Status: AC
  Administered 2016-06-18: 2.5 mg via SUBCUTANEOUS
  Filled 2016-06-18: qty 2.5

## 2016-06-18 MED ORDER — AMOXICILLIN-POT CLAVULANATE 875-125 MG PO TABS: 1.0000 | ORAL_TABLET | Freq: Two times a day (BID) | ORAL | 0 refills | Status: DC

## 2016-06-18 NOTE — Progress Notes (Signed)
Hematology and Oncology Follow Up Visit  Jorge [name] [id number] 1955-07-17 60 y.o. 06/18/2016   Principle Diagnosis:  IgG Kappa myeloma - Hyperdiploid/+11 DVT of the LEFT and RIGHT leg  Current Therapy:   RVD s/p cycle 6 Zometa 4 mg IV q month     Interim History:  Jorge [name] is here today for treatment. He has c/o productive cough (yellow phlegm yesterday and clear today) and occasional chills. Lung sounds are decreased in the right lower lobe on exam.  Chest xray showed mild right basilar subsegmental atelectasis. No pneumonia identified.   No fever, rash, dizziness, headache, SOB, chest pain, palpitations, abdominal pain or changes in bladder habits.  No diarrhea but more frequent BM's. He has "gurggling in his stomach when he is hungry and after he has eaten.  He is still having lower back pain since his Kyphoplasty. No swelling, tenderness, weakness, numbness or tingling in his extremities. No new aches or pains.  No falls or syncopal episodes.  He has done well on Xarelto. [place] 2 weeks ago showed no evidence of acute DVT within either lower extremity. He had minimal amount of focal eccentric wall thickening/chronic DVT within the proximal aspect of the right femoral vein. No episodes of bleeding, bruising or petechiae.  No lymphadenopathy found on exam.  His appetite is down but feels that he is staying hydrated. He had one episode of vomiting (no nausea) 2 days ago. His weight is down 5 lbs since his last visit with [place].  He still has a sore on the inside of his right cheek. He did not fill his magic mouthrinse script yet due to the cost of $51 and his insurance not covering.   Medications:    Medication List       Accurate as of 06/18/16 10:08 AM. Always use your most recent med list.          acetaminophen 500 MG tablet Commonly known as:  TYLENOL Take 500 mg by mouth every 6 (six) hours as needed (for pain/fever.).   aspirin 325 MG EC tablet Take 325 mg by mouth  daily.   carisoprodol 350 MG tablet Commonly known as:  SOMA Take 1 tablet (350 mg total) by mouth 4 (four) times daily as needed for muscle spasms.   cefdinir 300 MG capsule Commonly known as:  OMNICEF Take 2 capsules (600 mg total) by mouth daily. For 7 days   dexamethasone 4 MG tablet Commonly known as:  DECADRON Take 5 pills at one time with food every week   famciclovir 500 MG tablet Commonly known as:  FAMVIR Take 1 tablet (500 mg total) by mouth daily.   fentaNYL 50 MCG/HR Commonly known as:  DURAGESIC - dosed mcg/hr Place 1 patch (50 mcg total) onto the skin every other day.   HYDROcodone-acetaminophen 10-325 MG tablet Commonly known as:  NORCO Take 0.5-1 tablets by mouth every 8 (eight) hours as needed for moderate pain.   lenalidomide 25 MG capsule Commonly known as:  REVLIMID Take 1 capsule daily for 21 days on rthen 7 days off. Auth #[id number]   LORazepam 0.5 MG tablet Commonly known as:  ATIVAN Take 1 tablet (0.5 mg total) by mouth every 6 (six) hours as needed (Nausea or vomiting).   magic mouthwash w/lidocaine Soln Take 5 mLs by mouth 3 (three) times daily as needed for mouth pain.   magnesium oxide 400 MG tablet Commonly known as:  MAG-OX Take 400 mg by mouth daily.   ondansetron 8 MG  disintegrating tablet Commonly known as:  ZOFRAN-ODT Take 8 mg by mouth every 8 (eight) hours as needed.   Oxycodone HCl 10 MG Tabs Take 1 tablet (10 mg total) by mouth every 4 (four) hours as needed.   phenazopyridine 200 MG tablet Commonly known as:  PYRIDIUM Take 1 tablet (200 mg total) by mouth 3 (three) times daily as needed for pain.   polyethylene glycol powder powder Commonly known as:  MIRALAX Take 17 g by mouth daily.   PROBIOTIC PO Take 1 capsule by mouth daily.   prochlorperazine 10 MG tablet Commonly known as:  COMPAZINE Take 1 tablet (10 mg total) by mouth every 6 (six) hours as needed for nausea or vomiting.   rivaroxaban 20 MG Tabs  tablet Commonly known as:  XARELTO Take 1 tablet (20 mg total) by mouth daily with supper.   senna 8.6 MG Tabs tablet Commonly known as:  SENOKOT Take 2 tablets (17.2 mg total) by mouth daily.   sertraline 100 MG tablet Commonly known as:  ZOLOFT Take 100 mg by mouth at bedtime.   sucralfate 1 GM/10ML suspension Commonly known as:  CARAFATE Take 10 mLs (1 g total) by mouth 4 (four) times daily -  with meals and at bedtime.   tamsulosin 0.4 MG Caps capsule Commonly known as:  FLOMAX Take 0.4 mg by mouth daily.   triamcinolone 0.1 % paste Commonly known as:  KENALOG Use as directed 1 application in the mouth or throat 2 (two) times daily.   Vitamin D-3 5000 units Tabs Take 5,000 mg by mouth daily.   zolpidem 10 MG tablet Commonly known as:  AMBIEN Take 1 tablet (10 mg total) by mouth at bedtime as needed.       Allergies: No Known Allergies  Past Medical History, Surgical history, Social history, and Family History were reviewed and updated.  Review of Systems: All other 10 point review of systems is negative.   Physical Exam:  vitals were not taken for this visit.  Wt Readings from Last 3 Encounters:  06/18/16 171 lb 12 oz (77.9 kg)  06/17/16 170 lb 12.8 oz (77.5 kg)  06/10/16 176 lb 12.8 oz (80.2 kg)    Ocular: Sclerae unicteric, pupils equal, round and reactive to light Ear-nose-throat: Oropharynx clear, dentition fair Lymphatic: No cervical supraclavicular or axillary adenopathy Lungs no rales or rhonchi, good excursion bilaterally Heart regular rate and rhythm, no murmur appreciated Abd soft, nontender, positive bowel sounds, no liver or spleen tip palpated on exam, no fluid wave MSK has some tenderness along he thoracic spine, no joint edema Neuro: non-focal, well-oriented, appropriate affect Breasts: Deferred  Lab Results  Component Value Date   WBC 3.0 (L) 06/18/2016   HGB 12.2 (L) 06/18/2016   HCT 36.2 (L) 06/18/2016   MCV 96 06/18/2016   PLT  226 06/18/2016   No results found for: FERRITIN, IRON, TIBC, UIBC, IRONPCTSAT Lab Results  Component Value Date   RBC 3.76 (L) 06/18/2016   Lab Results  Component Value Date   KAPLAMBRATIO 2.66 (H) 06/04/2016   Lab Results  Component Value Date   IGGSERUM 802 06/04/2016   IGMSERUM 17 (L) 06/04/2016   Lab Results  Component Value Date   MSPIKE 0.9 (H) 04/23/2016     Chemistry      Component Value Date/Time   NA 136 06/18/2016 0840   NA 136 02/22/2016 0804   K 3.8 06/18/2016 0840   K 3.8 02/22/2016 0804   CL 108 06/18/2016 0840  CO2 22 06/18/2016 0840   CO2 22 02/22/2016 0804   BUN 10 06/18/2016 0840   BUN 16.4 02/22/2016 0804   CREATININE 1.0 06/18/2016 0840   CREATININE 1.0 02/22/2016 0804      Component Value Date/Time   CALCIUM 8.9 06/18/2016 0840   CALCIUM 9.8 02/22/2016 0804   ALKPHOS 59 06/18/2016 0840   ALKPHOS 95 02/22/2016 0804   AST 30 06/18/2016 0840   AST 19 02/22/2016 0804   ALT 26 06/18/2016 0840   ALT 24 02/22/2016 0804   BILITOT 0.60 06/18/2016 0840   BILITOT 0.41 02/22/2016 0804     Impression and Plan: Mr. Fulghum is a 60 yo white male with IgG Kappa Myeloma receiving RVD s/p cycle 13. He is here today with c/o a chest cold with productive cough and occasional chills.  His chest xray showed mild right basilar atelectasis. We will have his start Augmentin 875 mg PO BID for 7 days. He will also had Tessalon Perles for the cough. Scripts sent to his pharmacy. I spoke with Dr. Marin Olp and we will hold off with starting an appetite stimulant for now but continue to closely monitor his weight each visit.   We will have him try Biotene for stomatitis and see if this helps. If it doesn't, he will try the magic mouthwash.  He has his current treatment and appointment schedule. We will see him back in 2 weeks for lab, follow-up and treatment.  He states that he has not yet heard from [facility] regarding transplant.  Both he and his wife know to contact our  office with any questions or concerns. We can certainly see him sooner if need be.   Eliezer Bottom, NP 12/6/201710:08 AM

## 2016-06-18 NOTE — Patient Instructions (Signed)
Bortezomib injection What is this medicine? BORTEZOMIB (bor TEZ oh mib) is a medicine that targets proteins in cancer cells and stops the cancer cells from growing. It is used to treat multiple myeloma and mantle-cell lymphoma. This medicine may be used for other purposes; ask your health care provider or pharmacist if you have questions. What should I tell my health care provider before I take this medicine? They need to know if you have any of these conditions: -diabetes -heart disease -irregular heartbeat -liver disease -on hemodialysis -low blood counts, like low white blood cells, platelets, or hemoglobin -peripheral neuropathy -taking medicine for blood pressure -an unusual or allergic reaction to bortezomib, mannitol, boron, other medicines, foods, dyes, or preservatives -pregnant or trying to get pregnant -breast-feeding How should I use this medicine? This medicine is for injection into a vein or for injection under the skin. It is given by a health care professional in a hospital or clinic setting. Talk to your pediatrician regarding the use of this medicine in children. Special care may be needed. Overdosage: If you think you have taken too much of this medicine contact a poison control center or emergency room at once. NOTE: This medicine is only for you. Do not share this medicine with others. What if I miss a dose? It is important not to miss your dose. Call your doctor or health care professional if you are unable to keep an appointment. What may interact with this medicine? This medicine may interact with the following medications: -ketoconazole -rifampin -ritonavir -St. John's Wort This list may not describe all possible interactions. Give your health care provider a list of all the medicines, herbs, non-prescription drugs, or dietary supplements you use. Also tell them if you smoke, drink alcohol, or use illegal drugs. Some items may interact with your medicine. What  should I watch for while using this medicine? Visit your doctor for checks on your progress. This drug may make you feel generally unwell. This is not uncommon, as chemotherapy can affect healthy cells as well as cancer cells. Report any side effects. Continue your course of treatment even though you feel ill unless your doctor tells you to stop. You may get drowsy or dizzy. Do not drive, use machinery, or do anything that needs mental alertness until you know how this medicine affects you. Do not stand or sit up quickly, especially if you are an older patient. This reduces the risk of dizzy or fainting spells. In some cases, you may be given additional medicines to help with side effects. Follow all directions for their use. Call your doctor or health care professional for advice if you get a fever, chills or sore throat, or other symptoms of a cold or flu. Do not treat yourself. This drug decreases your body's ability to fight infections. Try to avoid being around people who are sick. This medicine may increase your risk to bruise or bleed. Call your doctor or health care professional if you notice any unusual bleeding. You may need blood work done while you are taking this medicine. In some patients, this medicine may cause a serious brain infection that may cause death. If you have any problems seeing, thinking, speaking, walking, or standing, tell your doctor right away. If you cannot reach your doctor, urgently seek other source of medical care. Do not become pregnant while taking this medicine. Women should inform their doctor if they wish to become pregnant or think they might be pregnant. There is a potential for serious  side effects to an unborn child. Talk to your health care professional or pharmacist for more information. Do not breast-feed an infant while taking this medicine. Check with your doctor or health care professional if you get an attack of severe diarrhea, nausea and vomiting, or if  you sweat a lot. The loss of too much body fluid can make it dangerous for you to take this medicine. What side effects may I notice from receiving this medicine? Side effects that you should report to your doctor or health care professional as soon as possible: -allergic reactions like skin rash, itching or hives, swelling of the face, lips, or tongue -breathing problems -changes in hearing -changes in vision -fast, irregular heartbeat -feeling faint or lightheaded, falls -pain, tingling, numbness in the hands or feet -right upper belly pain -seizures -swelling of the ankles, feet, hands -unusual bleeding or bruising -unusually weak or tired -vomiting -yellowing of the eyes or skin Side effects that usually do not require medical attention (report to your doctor or health care professional if they continue or are bothersome): -changes in emotions or moods -constipation -diarrhea -loss of appetite -headache -irritation at site where injected -nausea This list may not describe all possible side effects. Call your doctor for medical advice about side effects. You may report side effects to FDA at 1-800-FDA-1088. Where should I keep my medicine? This drug is given in a hospital or clinic and will not be stored at home. NOTE: This sheet is a summary. It may not cover all possible information. If you have questions about this medicine, talk to your doctor, pharmacist, or health care provider.    2016, [employer] Standard. (2014-08-29 14:47:04) Zoledronic Acid injection (Hypercalcemia, Oncology) What is this medicine? ZOLEDRONIC ACID (ZOE le dron ik AS id) lowers the amount of calcium loss from bone. It is used to treat too much calcium in your blood from cancer. It is also used to prevent complications of cancer that has spread to the bone. This medicine may be used for other purposes; ask your health care provider or pharmacist if you have questions. What should I tell my health  care provider before I take this medicine? They need to know if you have any of these conditions: -aspirin-sensitive asthma -cancer, especially if you are receiving medicines used to treat cancer -dental disease or wear dentures -infection -kidney disease -receiving corticosteroids like dexamethasone or prednisone -an unusual or allergic reaction to zoledronic acid, other medicines, foods, dyes, or preservatives -pregnant or trying to get pregnant -breast-feeding How should I use this medicine? This medicine is for infusion into a vein. It is given by a health care professional in a hospital or clinic setting. Talk to your pediatrician regarding the use of this medicine in children. Special care may be needed. Overdosage: If you think you have taken too much of this medicine contact a poison control center or emergency room at once. NOTE: This medicine is only for you. Do not share this medicine with others. What if I miss a dose? It is important not to miss your dose. Call your doctor or health care professional if you are unable to keep an appointment. What may interact with this medicine? -certain antibiotics given by injection -NSAIDs, medicines for pain and inflammation, like ibuprofen or naproxen -some diuretics like bumetanide, furosemide -teriparatide -thalidomide This list may not describe all possible interactions. Give your health care provider a list of all the medicines, herbs, non-prescription drugs, or dietary supplements you use. Also tell them   if you smoke, drink alcohol, or use illegal drugs. Some items may interact with your medicine. What should I watch for while using this medicine? Visit your doctor or health care professional for regular checkups. It may be some time before you see the benefit from this medicine. Do not stop taking your medicine unless your doctor tells you to. Your doctor may order blood tests or other tests to see how you are doing. Women should  inform their doctor if they wish to become pregnant or think they might be pregnant. There is a potential for serious side effects to an unborn child. Talk to your health care professional or pharmacist for more information. You should make sure that you get enough calcium and vitamin D while you are taking this medicine. Discuss the foods you eat and the vitamins you take with your health care professional. Some people who take this medicine have severe bone, joint, and/or muscle pain. This medicine may also increase your risk for jaw problems or a broken thigh bone. Tell your doctor right away if you have severe pain in your jaw, bones, joints, or muscles. Tell your doctor if you have any pain that does not go away or that gets worse. Tell your dentist and dental surgeon that you are taking this medicine. You should not have major dental surgery while on this medicine. See your dentist to have a dental exam and fix any dental problems before starting this medicine. Take good care of your teeth while on this medicine. Make sure you see your dentist for regular follow-up appointments. What side effects may I notice from receiving this medicine? Side effects that you should report to your doctor or health care professional as soon as possible: -allergic reactions like skin rash, itching or hives, swelling of the face, lips, or tongue -anxiety, confusion, or depression -breathing problems -changes in vision -eye pain -feeling faint or lightheaded, falls -jaw pain, especially after dental work -mouth sores -muscle cramps, stiffness, or weakness -redness, blistering, peeling or loosening of the skin, including inside the mouth -trouble passing urine or change in the amount of urine Side effects that usually do not require medical attention (report to your doctor or health care professional if they continue or are bothersome): -bone, joint, or muscle pain -constipation -diarrhea -fever -hair  loss -irritation at site where injected -loss of appetite -nausea, vomiting -stomach upset -trouble sleeping -trouble swallowing -weak or tired This list may not describe all possible side effects. Call your doctor for medical advice about side effects. You may report side effects to FDA at 1-800-FDA-1088. Where should I keep my medicine? This drug is given in a hospital or clinic and will not be stored at home. NOTE: This sheet is a summary. It may not cover all possible information. If you have questions about this medicine, talk to your doctor, pharmacist, or health care provider.    2016, [employer]/Gold Standard. (2013-11-26 14:19:39)  

## 2016-06-19 ENCOUNTER — Ambulatory Visit
Admission: RE | Admit: 2016-06-19 | Discharge: 2016-06-19 | Disposition: A | Discharge status: Home / Self Care | Payer: 59 | Source: Ambulatory Visit | Attending: Radiation Oncology | Admitting: Radiation Oncology

## 2016-06-19 ENCOUNTER — Encounter (HOSPITAL_BASED_OUTPATIENT_CLINIC_OR_DEPARTMENT_OTHER): Payer: Self-pay | Admitting: *Deleted

## 2016-06-19 DIAGNOSIS — Z510 Encounter for antineoplastic radiation therapy: Secondary | ICD-10-CM | POA: Diagnosis not present

## 2016-06-19 NOTE — Progress Notes (Signed)
NPO AFTER MN.  ARRIVE AT 0700.  CURRENT LAB RESULTS AND CXR IN CHART AND  EPIC.  WILL TAKE FLOMAX AND FAMVIR AM DOS W/ SIPS OF WATER AND IF NEEDED TAKE ONE TYPE PAIN RX/ NAUSEA RX.

## 2016-06-19 NOTE — Anesthesia Preprocedure Evaluation (Signed)
Anesthesia Evaluation  Patient identified by MRN, date of birth, ID band Patient awake    Reviewed: Allergy & Precautions, NPO status , Patient's Chart, lab work & pertinent test results  Airway Mallampati: II  TM Distance: >3 FB Neck ROM: Full    Dental  (+) Teeth Intact, Dental Advisory Given   Pulmonary neg pulmonary ROS, former smoker,    breath sounds clear to auscultation       Cardiovascular negative cardio ROS   Rhythm:Regular Rate:Normal     Neuro/Psych  Headaches, PSYCHIATRIC DISORDERS Anxiety Depression    GI/Hepatic Neg liver ROS, hiatal hernia, GERD  ,  Endo/Other  negative endocrine ROS  Renal/GU negative Renal ROS     Musculoskeletal negative musculoskeletal ROS (+)   Abdominal   Peds  Hematology negative hematology ROS (+)   Anesthesia Other Findings   Reproductive/Obstetrics negative OB ROS                             Anesthesia Physical  Anesthesia Plan  ASA: III  Anesthesia Plan: General   Post-op Pain Management:    Induction: Intravenous  Airway Management Planned: LMA  Additional Equipment:   Intra-op Plan:   Post-operative Plan: Extubation in OR  Informed Consent: I have reviewed the patients History and Physical, chart, labs and discussed the procedure including the risks, benefits and alternatives for the proposed anesthesia with the patient or authorized representative who has indicated his/her understanding and acceptance.   Dental advisory given  Plan Discussed with: CRNA  Anesthesia Plan Comments:         Anesthesia Quick Evaluation

## 2016-06-20 ENCOUNTER — Ambulatory Visit (HOSPITAL_BASED_OUTPATIENT_CLINIC_OR_DEPARTMENT_OTHER): Payer: 59 | Admitting: Anesthesiology

## 2016-06-20 ENCOUNTER — Encounter (HOSPITAL_BASED_OUTPATIENT_CLINIC_OR_DEPARTMENT_OTHER): Payer: Self-pay | Admitting: Anesthesiology

## 2016-06-20 ENCOUNTER — Ambulatory Visit: Payer: 59

## 2016-06-20 ENCOUNTER — Ambulatory Visit (HOSPITAL_BASED_OUTPATIENT_CLINIC_OR_DEPARTMENT_OTHER)
Admission: RE | Admit: 2016-06-20 | Discharge: 2016-06-20 | Disposition: A | Discharge status: Home / Self Care | Payer: 59 | Source: Ambulatory Visit | Attending: Urology | Admitting: Urology

## 2016-06-20 ENCOUNTER — Ambulatory Visit (HOSPITAL_COMMUNITY): Payer: 59

## 2016-06-20 ENCOUNTER — Encounter (HOSPITAL_BASED_OUTPATIENT_CLINIC_OR_DEPARTMENT_OTHER)
Admission: RE | Disposition: A | Discharge status: Home / Self Care | Payer: Self-pay | Source: Ambulatory Visit | Attending: Urology

## 2016-06-20 DIAGNOSIS — F419 Anxiety disorder, unspecified: Secondary | ICD-10-CM | POA: Diagnosis not present

## 2016-06-20 DIAGNOSIS — K219 Gastro-esophageal reflux disease without esophagitis: Secondary | ICD-10-CM | POA: Diagnosis not present

## 2016-06-20 DIAGNOSIS — Z7901 Long term (current) use of anticoagulants: Secondary | ICD-10-CM | POA: Insufficient documentation

## 2016-06-20 DIAGNOSIS — F329 Major depressive disorder, single episode, unspecified: Secondary | ICD-10-CM | POA: Diagnosis not present

## 2016-06-20 DIAGNOSIS — Z86718 Personal history of other venous thrombosis and embolism: Secondary | ICD-10-CM | POA: Insufficient documentation

## 2016-06-20 DIAGNOSIS — C9000 Multiple myeloma not having achieved remission: Secondary | ICD-10-CM | POA: Diagnosis not present

## 2016-06-20 DIAGNOSIS — Z79899 Other long term (current) drug therapy: Secondary | ICD-10-CM | POA: Insufficient documentation

## 2016-06-20 DIAGNOSIS — Z791 Long term (current) use of non-steroidal anti-inflammatories (NSAID): Secondary | ICD-10-CM | POA: Insufficient documentation

## 2016-06-20 DIAGNOSIS — N201 Calculus of ureter: Secondary | ICD-10-CM

## 2016-06-20 DIAGNOSIS — Z87891 Personal history of nicotine dependence: Secondary | ICD-10-CM | POA: Insufficient documentation

## 2016-06-20 DIAGNOSIS — N202 Calculus of kidney with calculus of ureter: Secondary | ICD-10-CM | POA: Diagnosis present

## 2016-06-20 HISTORY — DX: Diaphragmatic hernia without obstruction or gangrene: K44.9

## 2016-06-20 HISTORY — DX: Secondary malignant neoplasm of bone: C79.51

## 2016-06-20 HISTORY — PX: HOLMIUM LASER APPLICATION: SHX5852

## 2016-06-20 HISTORY — DX: Dorsalgia, unspecified: M54.9

## 2016-06-20 HISTORY — DX: Personal history of other venous thrombosis and embolism: Z86.718

## 2016-06-20 HISTORY — DX: Calculus of kidney: N20.0

## 2016-06-20 HISTORY — DX: Presence of spectacles and contact lenses: Z97.3

## 2016-06-20 HISTORY — DX: Unspecified acute lower respiratory infection: J22

## 2016-06-20 HISTORY — PX: CYSTOSCOPY/RETROGRADE/URETEROSCOPY/STONE EXTRACTION WITH BASKET: SHX5317

## 2016-06-20 HISTORY — DX: Other chronic pain: G89.29

## 2016-06-20 HISTORY — DX: Cough, unspecified: R05.9

## 2016-06-20 HISTORY — DX: Other forms of stomatitis: K12.1

## 2016-06-20 HISTORY — DX: Cough: R05

## 2016-06-20 HISTORY — DX: Personal history of traumatic brain injury: Z87.820

## 2016-06-20 HISTORY — PX: CYSTOSCOPY W/ URETERAL STENT PLACEMENT: SHX1429

## 2016-06-20 SURGERY — CYSTOSCOPY, WITH CALCULUS REMOVAL USING BASKET
Anesthesia: General | Site: Ureter | Laterality: Right

## 2016-06-20 MED ORDER — LIDOCAINE 2% (20 MG/ML) 5 ML SYRINGE
INTRAMUSCULAR | Status: AC
  Filled 2016-06-20: qty 5

## 2016-06-20 MED ORDER — OXYCODONE HCL 10 MG PO TABS: 10.0000 mg | ORAL_TABLET | ORAL | 0 refills | Status: DC | PRN

## 2016-06-20 MED ORDER — DEXAMETHASONE SODIUM PHOSPHATE 4 MG/ML IJ SOLN
INTRAMUSCULAR | Status: DC | PRN
  Administered 2016-06-20: 10 mg via INTRAVENOUS

## 2016-06-20 MED ORDER — MIDAZOLAM HCL 2 MG/2ML IJ SOLN
INTRAMUSCULAR | Status: AC
  Filled 2016-06-20: qty 2

## 2016-06-20 MED ORDER — MIDAZOLAM HCL 5 MG/5ML IJ SOLN
INTRAMUSCULAR | Status: DC | PRN
  Administered 2016-06-20: 2 mg via INTRAVENOUS

## 2016-06-20 MED ORDER — KETOROLAC TROMETHAMINE 30 MG/ML IJ SOLN
INTRAMUSCULAR | Status: DC | PRN
  Administered 2016-06-20: 30 mg via INTRAVENOUS

## 2016-06-20 MED ORDER — EPHEDRINE SULFATE 50 MG/ML IJ SOLN
INTRAMUSCULAR | Status: DC | PRN
  Administered 2016-06-20: 10 mg via INTRAVENOUS

## 2016-06-20 MED ORDER — MEPERIDINE HCL 25 MG/ML IJ SOLN
6.2500 mg | INTRAMUSCULAR | Status: DC | PRN
  Filled 2016-06-20: qty 1

## 2016-06-20 MED ORDER — EPHEDRINE 5 MG/ML INJ
INTRAVENOUS | Status: AC
  Filled 2016-06-20: qty 10

## 2016-06-20 MED ORDER — LIDOCAINE HCL (CARDIAC) 20 MG/ML IV SOLN
INTRAVENOUS | Status: DC | PRN
  Administered 2016-06-20: 80 mg via INTRAVENOUS

## 2016-06-20 MED ORDER — ONDANSETRON HCL 4 MG/2ML IJ SOLN
INTRAMUSCULAR | Status: AC
  Filled 2016-06-20: qty 2

## 2016-06-20 MED ORDER — PROPOFOL 10 MG/ML IV BOLUS
INTRAVENOUS | Status: AC
  Filled 2016-06-20: qty 40

## 2016-06-20 MED ORDER — DEXAMETHASONE SODIUM PHOSPHATE 10 MG/ML IJ SOLN
INTRAMUSCULAR | Status: AC
  Filled 2016-06-20: qty 1

## 2016-06-20 MED ORDER — CIPROFLOXACIN IN D5W 400 MG/200ML IV SOLN
INTRAVENOUS | Status: AC
  Filled 2016-06-20: qty 200

## 2016-06-20 MED ORDER — FENTANYL CITRATE (PF) 100 MCG/2ML IJ SOLN
INTRAMUSCULAR | Status: AC
  Filled 2016-06-20: qty 2

## 2016-06-20 MED ORDER — OXYCODONE HCL 5 MG/5ML PO SOLN
5.0000 mg | Freq: Once | ORAL | Status: DC | PRN
  Filled 2016-06-20: qty 5

## 2016-06-20 MED ORDER — PROPOFOL 10 MG/ML IV BOLUS
INTRAVENOUS | Status: DC | PRN
  Administered 2016-06-20: 180 mg via INTRAVENOUS
  Administered 2016-06-20: 50 mg via INTRAVENOUS
  Administered 2016-06-20: 20 mg via INTRAVENOUS

## 2016-06-20 MED ORDER — FENTANYL CITRATE (PF) 100 MCG/2ML IJ SOLN
INTRAMUSCULAR | Status: DC | PRN
  Administered 2016-06-20 (×2): 50 ug via INTRAVENOUS
  Administered 2016-06-20 (×2): 25 ug via INTRAVENOUS

## 2016-06-20 MED ORDER — HYDROMORPHONE HCL 1 MG/ML IJ SOLN
0.2500 mg | INTRAMUSCULAR | Status: DC | PRN
  Filled 2016-06-20: qty 0.5

## 2016-06-20 MED ORDER — OXYCODONE HCL 5 MG PO TABS
5.0000 mg | ORAL_TABLET | Freq: Once | ORAL | Status: DC | PRN
  Filled 2016-06-20: qty 1

## 2016-06-20 MED ORDER — LACTATED RINGERS IV SOLN
INTRAVENOUS | Status: DC
  Administered 2016-06-20 (×2): via INTRAVENOUS
  Filled 2016-06-20: qty 1000

## 2016-06-20 MED ORDER — CIPROFLOXACIN IN D5W 400 MG/200ML IV SOLN
400.0000 mg | INTRAVENOUS | Status: AC
  Administered 2016-06-20: 400 mg via INTRAVENOUS
  Filled 2016-06-20: qty 200

## 2016-06-20 MED ORDER — ONDANSETRON HCL 4 MG/2ML IJ SOLN
INTRAMUSCULAR | Status: DC | PRN
  Administered 2016-06-20 (×2): 4 mg via INTRAVENOUS

## 2016-06-20 MED ORDER — PROMETHAZINE HCL 25 MG/ML IJ SOLN
6.2500 mg | INTRAMUSCULAR | Status: DC | PRN
Start: 2016-06-20 — End: 2016-06-22
  Filled 2016-06-20: qty 1

## 2016-06-20 MED ORDER — SODIUM CHLORIDE 0.9 % IR SOLN
Status: DC | PRN
  Administered 2016-06-20: 6000 mL

## 2016-06-20 SURGICAL SUPPLY — 35 items
BAG DRAIN URO-CYSTO SKYTR STRL (DRAIN) ×3 IMPLANT
BASKET DAKOTA 1.9FR 11X120 (BASKET) ×6 IMPLANT
BASKET LASER NITINOL 1.9FR (BASKET) IMPLANT
BASKET STNLS GEMINI 4WIRE 3FR (BASKET) IMPLANT
BASKET ZERO TIP NITINOL 2.4FR (BASKET) IMPLANT
BLADE 10 SAFETY STRL DISP (BLADE) ×3 IMPLANT
CATH INTERMIT  6FR 70CM (CATHETERS) ×3 IMPLANT
CATH URET 5FR 28IN CONE TIP (BALLOONS)
CATH URET 5FR 70CM CONE TIP (BALLOONS) IMPLANT
CLOTH BEACON ORANGE TIMEOUT ST (SAFETY) ×3 IMPLANT
ELECT REM PT RETURN 9FT ADLT (ELECTROSURGICAL)
ELECTRODE REM PT RTRN 9FT ADLT (ELECTROSURGICAL) IMPLANT
FIBER LASER FLEXIVA 365 (UROLOGICAL SUPPLIES) IMPLANT
FIBER LASER FLEXIVA 550 (UROLOGICAL SUPPLIES) IMPLANT
FIBER LASER TRAC TIP (UROLOGICAL SUPPLIES) ×3 IMPLANT
GLOVE BIO SURGEON STRL SZ8 (GLOVE) ×3 IMPLANT
GOWN STRL REUS W/ TWL LRG LVL3 (GOWN DISPOSABLE) ×2 IMPLANT
GOWN STRL REUS W/ TWL XL LVL3 (GOWN DISPOSABLE) ×2 IMPLANT
GOWN STRL REUS W/TWL LRG LVL3 (GOWN DISPOSABLE) ×1
GOWN STRL REUS W/TWL XL LVL3 (GOWN DISPOSABLE) ×1
GUIDEWIRE 0.038 PTFE COATED (WIRE) IMPLANT
GUIDEWIRE ANG ZIPWIRE 038X150 (WIRE) IMPLANT
GUIDEWIRE STR DUAL SENSOR (WIRE) ×3 IMPLANT
IV NS IRRIG 3000ML ARTHROMATIC (IV SOLUTION) ×6 IMPLANT
KIT BALLIN UROMAX 15FX10 (LABEL) IMPLANT
KIT BALLN UROMAX 15FX4 (MISCELLANEOUS) IMPLANT
KIT BALLN UROMAX 26 75X4 (MISCELLANEOUS)
KIT ROOM TURNOVER WOR (KITS) ×3 IMPLANT
MANIFOLD NEPTUNE II (INSTRUMENTS) ×3 IMPLANT
PACK CYSTO (CUSTOM PROCEDURE TRAY) ×3 IMPLANT
SET HIGH PRES BAL DIL (LABEL)
SHEATH ACCESS URETERAL 38CM (SHEATH) ×3 IMPLANT
STENT POLARIS LOOP 6FR X 24 CM (STENTS) ×3 IMPLANT
TUBE CONNECTING 12X1/4 (SUCTIONS) ×3 IMPLANT
WATER STERILE IRR 3000ML UROMA (IV SOLUTION) IMPLANT

## 2016-06-20 NOTE — Anesthesia Procedure Notes (Addendum)
Procedure Name: LMA Insertion Date/Time: 06/20/2016 8:40 AM Performed by: Nolon Nations Pre-anesthesia Checklist: Patient identified, Emergency Drugs available, Suction available and Patient being monitored Patient Re-evaluated:Patient Re-evaluated prior to inductionOxygen Delivery Method: Circle system utilized Preoxygenation: Pre-oxygenation with 100% oxygen Intubation Type: IV induction Ventilation: Mask ventilation without difficulty LMA: LMA inserted LMA Size: 5.0 Number of attempts: 1 Airway Equipment and Method: Bite block Placement Confirmation: positive ETCO2 Tube secured with: Tape Dental Injury: Teeth and Oropharynx as per pre-operative assessment

## 2016-06-20 NOTE — Discharge Instructions (Signed)
Post stone removal/stent placement surgery instructions ° ° °Definitions: ° °Ureter: The duct that transports urine from the kidney to the bladder. °Stent: A plastic hollow tube that is placed into the ureter, from the kidney to the bladder to prevent the ureter from swelling shut. ° °General instructions: ° °Despite the fact that no skin incisions were used, the area around the ureter and bladder is raw and irritated. The stent is a foreign body which will further irritate the bladder wall. This irritation is manifested by increased frequency of urination, both day and night, and by an increase in the urge to urinate. In some, the urge to urinate is present almost always. Sometimes the urge is strong enough that you may not be able to stop your self from urinating. The only real cure is to remove the stent and then give time for the bladder wall to heal which can't be done until the danger of the ureter swelling shut has passed. (This varies from 2-21 days). ° °You may see some blood in your urine while the stent is in place and a few days afterward. Do not be alarmed, even if the urine is clear for a while. Get off your feet and drink lots of fluids until clearing occurs. If you start to pass clots or don't improve, call us. ° °If you have a string coming from your urethra:  The stent string is attached to your ureteral stent.  Do not pull on thisIf you have a string coming from your urethra:  The stent string is attached to your ureteral stent.  Do not pull on this. ° °Diet: ° °You may return to your normal diet immediately. Because of the raw surface of your bladder, alcohol, spicy foods, foods high in acid and drinks with caffeine may cause irritation or frequency and should be used in moderation. To keep your urine flowing freely and avoid constipation, drink plenty of fluids during the day (8-10 glasses). Tip: Avoid cranberry juice because it is very acidic. ° °Activity: ° °Your physical activity doesn't need  to be restricted. However, if you are very active, you may see some blood in the urine. We suggest that you reduce your activity under the circumstances until the bleeding has stopped. ° °Bowels: ° °It is important to keep your bowels regular during the postoperative period. Straining with bowel movements can cause bleeding. A bowel movement every other day is reasonable. Use a mild laxative if needed, such as milk of magnesia 2-3 tablespoons, or 2 Dulcolax tablets. Call if you continue to have problems. If you had been taking narcotics for pain, before, during or after your surgery, you may be constipated. Take a laxative if necessary. ° ° ° ° °Medication: ° °You should resume your pre-surgery medications unless told not to. DO NOT RESUME YOUR ASPIRIN, or any other medicines like ibuprofen, motrin, excedrin, advil, aleve, vitamin E, fish oil as these can all cause bleeding x 7 days. In addition you may be given an antibiotic to prevent or treat infection. Antibiotics are not always necessary. All medication should be taken as prescribed until the bottles are finished unless you are having an unusual reaction to one of the drugs. ° °Problems you should report to us: ° °a. Fever greater than 101°F. °b. Heavy bleeding, or clots (see notes above about blood in urine). °c. Inability to urinate. °d. Drug reactions (hives, rash, nausea, vomiting, diarrhea). °e. Severe burning or pain with urination that is not improving. ° °Followup: ° °  You will need a followup appointment to monitor your progress in most cases. Please call the office for this appointment when you get home if your appointment has not already been scheduled. Usually the first appointment will be about 5-14 days after your surgery and if you have a stent in place it will likely be removed at that time. ° ° °Post Anesthesia Home Care Instructions ° °Activity: °Get plenty of rest for the remainder of the day. A responsible adult should stay with you for 24  hours following the procedure.  °For the next 24 hours, DO NOT: °-Drive a car °-Operate machinery °-Drink alcoholic beverages °-Take any medication unless instructed by your physician °-Make any legal decisions or sign important papers. ° °Meals: °Start with liquid foods such as gelatin or soup. Progress to regular foods as tolerated. Avoid greasy, spicy, heavy foods. If nausea and/or vomiting occur, drink only clear liquids until the nausea and/or vomiting subsides. Call your physician if vomiting continues. ° °Special Instructions/Symptoms: °Your throat may feel dry or sore from the anesthesia or the breathing tube placed in your throat during surgery. If this causes discomfort, gargle with warm salt water. The discomfort should disappear within 24 hours. ° °If you had a scopolamine patch placed behind your ear for the management of post- operative nausea and/or vomiting: ° °1. The medication in the patch is effective for 72 hours, after which it should be removed.  Wrap patch in a tissue and discard in the trash. Wash hands thoroughly with soap and water. °2. You may remove the patch earlier than 72 hours if you experience unpleasant side effects which may include dry mouth, dizziness or visual disturbances. °3. Avoid touching the patch. Wash your hands with soap and water after contact with the patch. °  ° °

## 2016-06-20 NOTE — Transfer of Care (Signed)
  Last Vitals:  Vitals:   06/20/16 0722  BP: 113/67  Pulse: 79  Resp: 16  Temp: 36.3 C    Last Pain:  Vitals:   06/20/16 0722  TempSrc: Oral      Patients Stated Pain Goal: 4 (06/20/16 0827)  Immediate Anesthesia Transfer of Care Note  Patient: Jorge [name]  Procedure(s) Performed: Procedure(s) (LRB): CYSTOSCOPY/URETEROSCOPY/STONE EXTRACTION WITH BASKET (Right) HOLMIUM LASER APPLICATION (Right) CYSTOSCOPY WITH STENT REPLACEMENT (Right)  Patient Location: PACU  Anesthesia Type: General  Level of Consciousness: awake, alert  and oriented  Airway & Oxygen Therapy: Patient Spontanous Breathing and Patient connected to face mask oxygen  Post-op Assessment: Report given to PACU RN and Post -op Vital signs reviewed and stable  Post vital signs: Reviewed and stable  Complications: No apparent anesthesia complications

## 2016-06-20 NOTE — Anesthesia Postprocedure Evaluation (Signed)
Anesthesia Post Note  Patient: Jorge [name]  Procedure(s) Performed: Procedure(s) (LRB): CYSTOSCOPY/URETEROSCOPY/STONE EXTRACTION WITH BASKET (Right) HOLMIUM LASER APPLICATION (Right) CYSTOSCOPY WITH STENT REPLACEMENT (Right)  Patient location during evaluation: PACU Anesthesia Type: General Level of consciousness: sedated and patient cooperative Pain management: pain level controlled Vital Signs Assessment: post-procedure vital signs reviewed and stable Respiratory status: spontaneous breathing Cardiovascular status: stable Anesthetic complications: no    Last Vitals:  Vitals:   06/20/16 1100 06/20/16 1236  BP: 109/74 (!) 105/55  Pulse: 91 85  Resp: 12 16  Temp:  36.6 C    Last Pain:  Vitals:   06/20/16 1236  TempSrc: Oral                 Nolon Nations

## 2016-06-20 NOTE — Interval H&P Note (Signed)
He states the first two days post op were very painful but he is tolerating without significant issue at this point. He will still have some dysuria and ureteral spasms with voiding. Also is experiencing some intermittent hematuria. Denies debilitating pain or fevers.   He is no longer taking Xarelto for DVT. He had a Doppler ultrasound last week That indicated no recurrence of that obstruction. He continues with radiation therapy for treatment of myeloma. He will be having stem cell implantation in January 2018.  He is going to undergo a repeat attempt at right ureteroscopy and laser lithotripsy of his right renal calculus.

## 2016-06-20 NOTE — H&P (View-Only) (Signed)
CC: I have a ureteral stone.  HPI: Jorge [name] is a 60 year-old male patient with a ureteral calculus.  The the kidney stone was on the right side. He first noticed the symptoms 05/12/2016. This is his first kidney stone. There is not a history of calculus disease in the family. He is not currently having flank pain, back pain, groin pain, nausea, vomiting, fever or chills. He does not have a burning sensation when he urinates. He has not caught a stone in his urine strainer since his symptoms began.   He has never had surgical treatment for calculi in the past.   CT 05/12/16 - 62m wide x 134mlong stone at the UVJ on the right (900HU). 71m76mt. upper pole stone and no Lt. renal calculi.   He developed gross hematuria on Xarelto 24 hours ago. It has persisted. It prompted his visit to the emergency room where CT scan was obtained that revealed the cause was a 7 mm stone in his proximal right ureter. He is not having any pain. He just started taking Xarelto recently. He has a history of multiple myeloma and is going to be going for a stem cell transplant after the first of the year.     ALLERGIES: No Allergies    MEDICATIONS: Ambien 5 MG Oral Tablet 1 Oral  Dexamethasone  Famciclovir  Fentanyl 50 mcg/hour patch, transdermal 72 hours 1 PO Daily  Flomax 0.4 mg capsule, ext release 24 hr  Ibuprofen 400 MG Oral Tablet Oral  Revlimid 25 mg capsule  Tylenol Extra Strength 500 MG Oral Tablet Oral  Velcade  Xarelto 20 mg tablet  Zoloft 100 mg tablet     GU PSH: None     PSH Notes: Rotator Cuff Repair   NON-GU PSH: Back Surgery (Unspecified) Inguinal Hernia Repair > 5 yrs Rotator Cuff Surgery    GU PMH: Peyronies Disease, Peyronie's disease - 2014      PMH Notes: History of Peyronie's disease: He was initially evaluated in 12/08 and was noted to have a slight dorsal curvature of the penis which was not particularly painful and did not cause problems with intercourse. He did  empirically try Vitamin E with no real results. He has noted worsening dorsal curvature of the penis with a plaque that extends from the base of the penis approximately 4-5 cm. Although he has increased curvature, he has not noted any increased pain and has been able to continue to participate in intercourse without difficulty or problems. He has had some difficulty achieving orgasm about 30% of the time. He thinks that for about 6 months now at least the amount of curvature has remained stable although he's not 100% sure that that's the case. He describes the amount of curvature at about 90 although despite that he said he and his wife are still able to engage in intercourse. He was found, due to the screening for our collagenase study, that he had extensive plaque calcification which excluded him from the study.    NON-GU PMH: Personal history of other diseases of the digestive system, History of esophageal reflux - 2014 Personal history of other mental and behavioral disorders, History of depression - 2014 Anxiety Depression DVT, History Multiple myeloma not having achieved remission    FAMILY HISTORY: 1 Daughter - Runs in Family 2 sons - Runs in Family Hypertension - Father skin cancer - Father Uterine Cancer - Mother   SOCIAL HISTORY: Marital Status: Married Current Smoking Status: Patient does not  smoke anymore.  Does drink.  Drinks 2 caffeinated drinks per day.     Notes: Marital History - Currently Married, Tobacco Use, Alcohol Use, Occupation:   REVIEW OF SYSTEMS:    GU Review Male:   Patient reports get up at night to urinate. Patient denies frequent urination, hard to postpone urination, burning/ pain with urination, leakage of urine, stream starts and stops, trouble starting your stream, have to strain to urinate , erection problems, and penile pain.  Gastrointestinal (Upper):   Patient reports indigestion/ heartburn. Patient denies nausea and vomiting.  Gastrointestinal  (Lower):   Patient reports constipation. Patient denies diarrhea.  Constitutional:   Patient reports fatigue. Patient denies fever, night sweats, and weight loss.  Skin:   Patient denies skin rash/ lesion and itching.  Eyes:   Patient denies blurred vision and double vision.  Ears/ Nose/ Throat:   Patient denies sore throat and sinus problems.  Hematologic/Lymphatic:   Patient reports easy bruising. Patient denies swollen glands.  Cardiovascular:   Patient reports leg swelling. Patient denies chest pains.  Respiratory:   Patient denies cough and shortness of breath.  Endocrine:   Patient denies excessive thirst.  Musculoskeletal:   Patient reports back pain. Patient denies joint pain.  Neurological:   Patient denies headaches and dizziness.  Psychologic:   Patient reports depression and anxiety.    VITAL SIGNS:    Weight 178 lb / 80.74 kg  Height 70 in / 177.8 cm  BP 123/79 mmHg  Pulse 73 /min  BMI 25.5 kg/m   MULTI-SYSTEM PHYSICAL EXAMINATION:    Constitutional: Well-nourished. No physical deformities. Normally developed. Good grooming.  Neck: Neck symmetrical, not swollen. Normal tracheal position.  Respiratory: No labored breathing, no use of accessory muscles.   Cardiovascular: Normal temperature, normal extremity pulses, no swelling, no varicosities.  Lymphatic: No enlargement of neck, axillae, groin.  Skin: No paleness, no jaundice, no cyanosis. No lesion, no ulcer, no rash.  Neurologic / Psychiatric: Oriented to time, oriented to place, oriented to person. No depression, no anxiety, no agitation.  Gastrointestinal: No mass, no tenderness, no rigidity, non obese abdomen.  Eyes: Normal conjunctivae. Normal eyelids.  Ears, Nose, Mouth, and Throat: Left ear no scars, no lesions, no masses. Right ear no scars, no lesions, no masses. Nose no scars, no lesions, no masses. Normal hearing. Normal lips.  Musculoskeletal: Normal gait and station of head and neck.     PAST DATA  REVIEWED:  Source Of History:  Patient  Records Review:   Previous Hospital Records, Previous Patient Records  Urine Test Review:   Urinalysis  X-Ray Review: C.T. Abdomen/Pelvis: Reviewed Films. Reviewed Report. Discussed With Patient. Done 05/12/16    PROCEDURES:          Urinalysis w/Scope Dipstick Dipstick Cont'd Micro  Color: Red Bilirubin: Invalid WBC/hpf: NS (Not Seen)  Appearance: Turbid Ketones: Invalid RBC/hpf: >60/hpf  Specific Gravity: Invalid Blood: Invalid Bacteria: Rare (0-9/hpf)  pH: Invalid Protein: Invalid Cystals: NS (Not Seen)  Glucose: Invalid Urobilinogen: Invalid Casts: NS (Not Seen)    Nitrites: Invalid Trichomonas: Not Present    Leukocyte Esterase: Invalid Mucous: Not Present      Epithelial Cells: NS (Not Seen)      Yeast: NS (Not Seen)      Sperm: Not Present    Notes: microscopic performed on unconcentrated urine    ASSESSMENT:      ICD-10 Details  1 GU:   Calculus Ureter - N20.1 Right, Acute - I will tentatively   plan to see him back in 4 months for a right renal ultrasound.  2   Gross hematuria - R31.0 Acute - He is currently experiencing gross hematuria.Told him that if this persists over the next 48 hours he should contact me.          Notes:   He has a proximal right ureteral stone with a low probability of spontaneous passage. Typically I would recommend either lithotripsy or ureteroscopy however he must remain on Xarelto for a recent DVT and will need to be on that 6 months. Complicating matters is the fact that he is going to be undergoing stem cell transplant after the first of the year and so we discussed the options. If he develops severe flank pain he could be temporized with a stent. If his hematuria clears observation and treatment of his stone once he is able to safely come off of his antiplatelet therapy would be the best width monitoring of his kidney for development of silent obstruction which we discussed today. Ureteroscopy is an option and  can be performed on patient's taking Xarelto however there is some risk in doing this and if it can be avoided I would like to do so. We did discuss using Lovenox as a bridge and potentially performing ureteroscopy if it became absolutely necessary.  I told him if he developed intractable pain or continued to have gross hematuria he should contact me. Otherwise what we have decided to do is monitor him with a repeat right renal ultrasound again in 2 months and follow him every 2 months with an ultrasound to assure he does not develop silent obstruction and potentially treat his stone in 6 months when he is able to come off his antiplatelet therapy.   I subsequently spoke with Dr. Marin Olp about his case.  He indicated that he wanted to have the stone treated before he underwent his stem cell transplant which I think is reasonable especially since Dr. Marin Olp said he could safely come off his Xarelto in preparation for ureteroscopy.  I think ureteroscopy has the greatest probability of obtaining a stroke free state and therefore spoke with the patient about this and he will stop his Xarelto 48 hours prior to his surgery.   PLAN: 1.  Stop Xarelto 2 days prior to surgery. 2.  Proceed with cystoscopy, right retrograde pyelogram, laser lithotripsy and stent placement.

## 2016-06-20 NOTE — Op Note (Signed)
PATIENT:  Jorge [name]  PRE-OPERATIVE DIAGNOSIS:  right Ureteral calculus  POST-OPERATIVE DIAGNOSIS: Same  PROCEDURE:  1. Right double-J stent removal 2. Right ureteroscopy with laser lithotripsy and stone extraction (second stage of staged procedure). 3. Right double-J stent replacement. 4. Fluoroscopy time less than 1 hour  SURGEON: Claybon Jabs, MD  INDICATION: [name] is a 60 year old male who had a right proximal ureteral stone that I attempted to treat previously but found it relative proximal right ureteral stricture that would not allow the scope beyond this location. I even tried dilating this but was unsuccessful at gaining access to the more proximal ureter. I therefore left a 7 French stent in place and he returns today for definitive management of his stone now that the stent has been allowed to cause dilatation of the ureter.  ANESTHESIA:  General  EBL:  Minimal  DRAINS: 6 [place], 24 cm Polaris stent (with string) Stone given the patient  SPECIMEN:  Stone given to patient  DESCRIPTION OF PROCEDURE: The patient was taken to the major OR and placed on the table. General anesthesia was administered and then the patient was moved to the dorsal lithotomy position. The genitalia was sterilely prepped and draped. An official timeout was performed.  Initially the 62 French cystoscope with 30 lens was passed under direct vision into the bladder. The bladder was then fully inspected. It was noted be free of any tumors, stones or inflammatory lesions. Ureteral orifices were of normal configuration and position. I identified the stent exiting his right ureter and grasped the end of the stent with alligator forceps and withdrew it through the urethral meatus. A 0.038 inch floppy-tipped guidewire was then passed through the stent and into the area of the renal pelvis under direct fluoroscopy. The stent was then removed and I initially passed the inner portion of the ureteral access  sheath over the guidewire and up the ureter easily. I therefore reassembled the ureteral access sheath and passed this over the guidewire leaving the sheath in place I removed the inner portion of the access sheath and guidewire and then proceeded with ureteroscopy.  A 6 French flexible, digital ureteroscope was then passed through the access sheath and into the area of the right renal pelvis. The stone was identified and I felt it was too large to extract and therefore elected to proceed with laser lithotripsy. The 200  holmium laser fiber was used to fragment the stone. I then used the [place] basket to extract all of the stone fragments and reinspection of the intrarenal collecting system revealed no further stone fragments greater than 0.5 mm I therefore passed the guidewire through the ureteroscope into the area the renal pelvis under direct visualization and removed the ureteroscope and ureteral access sheath. I then backloaded the cystoscope over the guidewire and passed the stent over the guidewire into the area of the renal pelvis. As the guidewire was removed good curl was noted in the renal pelvis. The bladder was drained and the cystoscope was then removed. The patient tolerated the procedure well no intraoperative complications.  PLAN OF CARE: Discharge to home after PACU  PATIENT DISPOSITION:  PACU - hemodynamically stable.

## 2016-06-23 ENCOUNTER — Ambulatory Visit
Admission: RE | Admit: 2016-06-23 | Discharge: 2016-06-23 | Disposition: A | Discharge status: Home / Self Care | Payer: 59 | Source: Ambulatory Visit | Attending: Radiation Oncology | Admitting: Radiation Oncology

## 2016-06-23 ENCOUNTER — Encounter (HOSPITAL_BASED_OUTPATIENT_CLINIC_OR_DEPARTMENT_OTHER): Payer: Self-pay | Admitting: Urology

## 2016-06-23 DIAGNOSIS — Z510 Encounter for antineoplastic radiation therapy: Secondary | ICD-10-CM | POA: Diagnosis not present

## 2016-06-24 ENCOUNTER — Ambulatory Visit: Payer: 59

## 2016-06-25 ENCOUNTER — Other Ambulatory Visit (HOSPITAL_BASED_OUTPATIENT_CLINIC_OR_DEPARTMENT_OTHER): Payer: 59

## 2016-06-25 ENCOUNTER — Ambulatory Visit (HOSPITAL_BASED_OUTPATIENT_CLINIC_OR_DEPARTMENT_OTHER): Payer: 59

## 2016-06-25 VITALS — BP 121/67 | HR 78 | Temp 97.8°F | Resp 17

## 2016-06-25 DIAGNOSIS — C9000 Multiple myeloma not having achieved remission: Secondary | ICD-10-CM | POA: Diagnosis not present

## 2016-06-25 DIAGNOSIS — Z5112 Encounter for antineoplastic immunotherapy: Secondary | ICD-10-CM | POA: Diagnosis not present

## 2016-06-25 LAB — CMP (CANCER CENTER ONLY)
ALK PHOS: 71 U/L (ref 26–84)
ALT(SGPT): 29 U/L (ref 10–47)
AST: 27 U/L (ref 11–38)
Albumin: 3.8 g/dL (ref 3.3–5.5)
BUN, Bld: 11 mg/dL (ref 7–22)
CALCIUM: 9.3 mg/dL (ref 8.0–10.3)
CHLORIDE: 106 meq/L (ref 98–108)
CO2: 24 meq/L (ref 18–33)
Creat: 0.9 mg/dl (ref 0.6–1.2)
GLUCOSE: 91 mg/dL (ref 73–118)
POTASSIUM: 3.9 meq/L (ref 3.3–4.7)
Sodium: 138 mEq/L (ref 128–145)
Total Bilirubin: 0.5 mg/dl (ref 0.20–1.60)
Total Protein: 6.8 g/dL (ref 6.4–8.1)

## 2016-06-25 LAB — CBC WITH DIFFERENTIAL (CANCER CENTER ONLY)
BASO#: 0 10*3/uL (ref 0.0–0.2)
BASO%: 0 % (ref 0.0–2.0)
EOS ABS: 0 10*3/uL (ref 0.0–0.5)
EOS%: 1 % (ref 0.0–7.0)
HEMATOCRIT: 38.5 % — AB (ref 38.7–49.9)
HGB: 12.8 g/dL — ABNORMAL LOW (ref 13.0–17.1)
LYMPH#: 0.4 10*3/uL — AB (ref 0.9–3.3)
LYMPH%: 12.8 % — ABNORMAL LOW (ref 14.0–48.0)
MCH: 31.8 pg (ref 28.0–33.4)
MCHC: 33.2 g/dL (ref 32.0–35.9)
MCV: 96 fL (ref 82–98)
MONO#: 0.2 10*3/uL (ref 0.1–0.9)
MONO%: 5.4 % (ref 0.0–13.0)
NEUT#: 2.4 10*3/uL (ref 1.5–6.5)
NEUT%: 80.8 % — AB (ref 40.0–80.0)
Platelets: 254 10*3/uL (ref 145–400)
RBC: 4.03 10*6/uL — ABNORMAL LOW (ref 4.20–5.70)
RDW: 14.5 % (ref 11.1–15.7)
WBC: 3 10*3/uL — ABNORMAL LOW (ref 4.0–10.0)

## 2016-06-25 LAB — TECHNOLOGIST REVIEW CHCC SATELLITE

## 2016-06-25 MED ORDER — PROCHLORPERAZINE MALEATE 10 MG PO TABS
10.0000 mg | ORAL_TABLET | Freq: Once | ORAL | Status: AC
  Administered 2016-06-25: 10 mg via ORAL

## 2016-06-25 MED ORDER — PROCHLORPERAZINE MALEATE 10 MG PO TABS
ORAL_TABLET | ORAL | Status: AC
  Filled 2016-06-25: qty 1

## 2016-06-25 MED ORDER — BORTEZOMIB CHEMO SQ INJECTION 3.5 MG (2.5MG/ML)
1.3000 mg/m2 | Freq: Once | INTRAMUSCULAR | Status: AC
  Administered 2016-06-25: 2.5 mg via SUBCUTANEOUS
  Filled 2016-06-25: qty 2.5

## 2016-06-25 NOTE — Patient Instructions (Signed)
[facility] Discharge Instructions for Patients Receiving Chemotherapy  Today you received the following chemotherapy agents:  Velcade  To help prevent nausea and vomiting after your treatment, we encourage you to take your nausea medication as prescribed.   If you develop nausea and vomiting that is not controlled by your nausea medication, call the clinic.   BELOW ARE SYMPTOMS THAT SHOULD BE REPORTED IMMEDIATELY:  *FEVER GREATER THAN 100.5 F  *CHILLS WITH OR WITHOUT FEVER  NAUSEA AND VOMITING THAT IS NOT CONTROLLED WITH YOUR NAUSEA MEDICATION  *UNUSUAL SHORTNESS OF BREATH  *UNUSUAL BRUISING OR BLEEDING  TENDERNESS IN MOUTH AND THROAT WITH OR WITHOUT PRESENCE OF ULCERS  *URINARY PROBLEMS  *BOWEL PROBLEMS  UNUSUAL RASH Items with * indicate a potential emergency and should be followed up as soon as possible.  Feel free to call the clinic you have any questions or concerns. The clinic phone number is [phone].  Please show the CHEMO ALERT CARD at check-in to the Emergency Department and triage nurse.   

## 2016-06-26 ENCOUNTER — Encounter: Payer: Self-pay | Admitting: Radiation Oncology

## 2016-06-26 NOTE — Progress Notes (Signed)
  Radiation Oncology         [id number]) [phone] ________________________________  Name: Jorge [name] MRN: [id number]  Date: 06/26/2016  DOB: 1956/04/16  End of Treatment Note  Diagnosis:  Multiple myeloma not having achieved remission    Indication for treatment:  Curative      Radiation treatment dates:   06/09/16-06/23/16  Site/dose:  Lower thoracic spine / 25 Gy in 10 fractions  Beams/energy:   3D / 15X  Narrative: The patient tolerated radiation treatment relatively well.   He reported 4/10 pain in his mid back as well as a sore throat and coughing during treatment. Esophagitis treated with Carafate. Patient was already on fentanyl patch and Norco  Plan: The patient has completed radiation treatment. The patient will return to radiation oncology clinic for routine followup in one month. I advised them to call or return sooner if they have any questions or concerns related to their recovery or treatment.  -----------------------------------  Blair Promise, PhD, MD  This document serves as a record of services personally performed by Gery Pray, MD. It was created on his behalf by Bethann Humble, a trained medical scribe. The creation of this record is based on the scribe's personal observations and the provider's statements to them. This document has been checked and approved by the attending provider.

## 2016-07-01 ENCOUNTER — Ambulatory Visit: Payer: 59 | Admitting: Hematology & Oncology

## 2016-07-01 ENCOUNTER — Ambulatory Visit: Payer: 59

## 2016-07-01 ENCOUNTER — Other Ambulatory Visit: Payer: 59

## 2016-07-02 ENCOUNTER — Ambulatory Visit (HOSPITAL_BASED_OUTPATIENT_CLINIC_OR_DEPARTMENT_OTHER): Payer: 59 | Admitting: Hematology & Oncology

## 2016-07-02 ENCOUNTER — Ambulatory Visit (HOSPITAL_BASED_OUTPATIENT_CLINIC_OR_DEPARTMENT_OTHER): Payer: 59

## 2016-07-02 ENCOUNTER — Other Ambulatory Visit (HOSPITAL_BASED_OUTPATIENT_CLINIC_OR_DEPARTMENT_OTHER): Payer: 59

## 2016-07-02 VITALS — BP 113/65 | HR 88 | Temp 98.1°F | Resp 20 | Wt 172.8 lb

## 2016-07-02 DIAGNOSIS — Z5112 Encounter for antineoplastic immunotherapy: Secondary | ICD-10-CM | POA: Diagnosis not present

## 2016-07-02 DIAGNOSIS — C9000 Multiple myeloma not having achieved remission: Secondary | ICD-10-CM

## 2016-07-02 DIAGNOSIS — Z9484 Stem cells transplant status: Secondary | ICD-10-CM | POA: Diagnosis not present

## 2016-07-02 LAB — CBC WITH DIFFERENTIAL (CANCER CENTER ONLY)
BASO#: 0 10*3/uL (ref 0.0–0.2)
BASO%: 0.2 % (ref 0.0–2.0)
EOS ABS: 0.1 10*3/uL (ref 0.0–0.5)
EOS%: 1.8 % (ref 0.0–7.0)
HEMATOCRIT: 36.6 % — AB (ref 38.7–49.9)
HGB: 12.3 g/dL — ABNORMAL LOW (ref 13.0–17.1)
LYMPH#: 1 10*3/uL (ref 0.9–3.3)
LYMPH%: 15.9 % (ref 14.0–48.0)
MCH: 31.9 pg (ref 28.0–33.4)
MCHC: 33.6 g/dL (ref 32.0–35.9)
MCV: 95 fL (ref 82–98)
MONO#: 0.4 10*3/uL (ref 0.1–0.9)
MONO%: 5.8 % (ref 0.0–13.0)
NEUT#: 4.6 10*3/uL (ref 1.5–6.5)
NEUT%: 76.3 % (ref 40.0–80.0)
Platelets: 223 10*3/uL (ref 145–400)
RBC: 3.85 10*6/uL — ABNORMAL LOW (ref 4.20–5.70)
RDW: 14.8 % (ref 11.1–15.7)
WBC: 6 10*3/uL (ref 4.0–10.0)

## 2016-07-02 LAB — CMP (CANCER CENTER ONLY)
ALK PHOS: 76 U/L (ref 26–84)
ALT: 38 U/L (ref 10–47)
AST: 34 U/L (ref 11–38)
Albumin: 3.7 g/dL (ref 3.3–5.5)
BUN, Bld: 13 mg/dL (ref 7–22)
CALCIUM: 8.7 mg/dL (ref 8.0–10.3)
CO2: 25 mEq/L (ref 18–33)
Chloride: 106 mEq/L (ref 98–108)
Creat: 1 mg/dl (ref 0.6–1.2)
Glucose, Bld: 148 mg/dL — ABNORMAL HIGH (ref 73–118)
POTASSIUM: 3.9 meq/L (ref 3.3–4.7)
Sodium: 141 mEq/L (ref 128–145)
TOTAL PROTEIN: 6.8 g/dL (ref 6.4–8.1)
Total Bilirubin: 0.6 mg/dl (ref 0.20–1.60)

## 2016-07-02 MED ORDER — PROCHLORPERAZINE MALEATE 10 MG PO TABS
10.0000 mg | ORAL_TABLET | Freq: Once | ORAL | Status: AC
  Administered 2016-07-02: 10 mg via ORAL

## 2016-07-02 MED ORDER — PROCHLORPERAZINE MALEATE 10 MG PO TABS
ORAL_TABLET | ORAL | Status: AC
  Filled 2016-07-02: qty 1

## 2016-07-02 MED ORDER — BORTEZOMIB CHEMO SQ INJECTION 3.5 MG (2.5MG/ML)
1.3000 mg/m2 | Freq: Once | INTRAMUSCULAR | Status: AC
  Administered 2016-07-02: 2.5 mg via SUBCUTANEOUS
  Filled 2016-07-02: qty 2.5

## 2016-07-02 NOTE — Patient Instructions (Signed)

## 2016-07-02 NOTE — Progress Notes (Signed)
Hematology and Oncology Follow Up Visit  Nollie Terlizzi [id number] 01-Jan-1956 60 y.o. 07/02/2016   Principle Diagnosis:  IgG Kappa myeloma - Hyperdiploid/+11 DVT of the LEFT and RIGHT leg Current Therapy:    S/p cycle #5 of RVD  Zometa 4 mg IV q month Xarelto 20 mg po q day - started on 03/21/2016 Palliative XRT to the back - start on 06/09/2016     Interim History:  Mr. Ducharme is back for follow-up. He looks somewhat better. He finally had the kidney stone taken care of in the left kidney.  This was tough on him.  He finishes radiation to the back. This helped him out somewhat.  He did see Dr. Laverta Baltimore at [facility] for the stem cell transplant. It sounds like the stem cell transplant will happen sometime in late January.  He is afforded to going to [place] after Christmas. He has wife will be going down for about 5 or 6 days. He really deserves this.   He has responded fairly well to treatment. His last M spike was 0.6 g/dL. His IgG level was down to  802 milligrams per deciliter. His Kappa Lightchain was 2.2 mg/dL.   He will have a bone marrow biopsy done at [facility] in January.   His back is doing better. He is able to do more. He has more flexibility.   I have him off Revlimid now. I want him off Revlimid now until he has his stem cells collected.   He has no cough or shortness of breath. He's had no leg swelling.   He is on full dose aspirin now. He was on Xarelto for the thrombotic disease in both of his legs. His last Dopplers showed resolution of his thrombotic disease.   Overall, his performance status is ECOG 1.   Medications:  Current Outpatient Prescriptions:  .  acetaminophen (TYLENOL) 500 MG tablet, Take 500 mg by mouth every 6 (six) hours as needed (for pain/fever.). , Disp: , Rfl:  .  aspirin 325 MG EC tablet, Take 325 mg by mouth daily., Disp: , Rfl:  .  calcium carbonate (TUMS - DOSED IN MG ELEMENTAL CALCIUM) 500 MG chewable tablet, Chew 1 tablet by mouth as needed  for indigestion or heartburn., Disp: , Rfl:  .  Cholecalciferol (VITAMIN D-3) 5000 units TABS, Take 5,000 mg by mouth daily., Disp: , Rfl:  .  dexamethasone (DECADRON) 4 MG tablet, Take 5 pills at one time with food every week (Patient taking differently: Take 20 mg by mouth every Wednesday. Take 5 pills at one time with food every week), Disp: 60 tablet, Rfl: 4 .  famciclovir (FAMVIR) 500 MG tablet, Take 1 tablet (500 mg total) by mouth daily. (Patient taking differently: Take 500 mg by mouth every morning. ), Disp: 30 tablet, Rfl: 8 .  fentaNYL (DURAGESIC - DOSED MCG/HR) 50 MCG/HR, Place 1 patch (50 mcg total) onto the skin every other day., Disp: 15 patch, Rfl: 0 .  HYDROcodone-acetaminophen (NORCO) 10-325 MG tablet, Take 0.5-1 tablets by mouth every 8 (eight) hours as needed for moderate pain., Disp: 60 tablet, Rfl: 0 .  lenalidomide (REVLIMID) 25 MG capsule, Take 1 capsule daily for 21 days on rthen 7 days off. Josem Kaufmann #[id number] (Patient taking differently: Take 25 mg by mouth every evening. Take 1 capsule daily for 21 days on rthen 7 days off. Josem Kaufmann #[id number]), Disp: 21 capsule, Rfl: 0 .  LORazepam (ATIVAN) 0.5 MG tablet, Take 1 tablet (0.5 mg total) by mouth every 6 (  six) hours as needed (Nausea or vomiting)., Disp: 30 tablet, Rfl: 0 .  magic mouthwash w/lidocaine SOLN, Take 5 mLs by mouth 3 (three) times daily as needed for mouth pain., Disp: 450 mL, Rfl: 3 .  magnesium oxide (MAG-OX) 400 MG tablet, Take 400 mg by mouth daily., Disp: , Rfl:  .  ondansetron (ZOFRAN-ODT) 8 MG disintegrating tablet, Take 8 mg by mouth every 8 (eight) hours as needed., Disp: , Rfl: 1 .  Oxycodone HCl 10 MG TABS, Take 1 tablet (10 mg total) by mouth every 4 (four) hours as needed., Disp: 30 tablet, Rfl: 0 .  polyethylene glycol powder (MIRALAX) powder, Take 17 g by mouth daily. (Patient taking differently: Take 17 g by mouth as needed. ), Disp: 850 g, Rfl: 3 .  Probiotic Product (PROBIOTIC PO), Take 1 capsule by mouth  daily., Disp: , Rfl:  .  prochlorperazine (COMPAZINE) 10 MG tablet, Take 1 tablet (10 mg total) by mouth every 6 (six) hours as needed for nausea or vomiting., Disp: 30 tablet, Rfl: 3 .  ranitidine (ZANTAC) 150 MG tablet, Take 150 mg by mouth as needed for heartburn., Disp: , Rfl:  .  senna (SENOKOT) 8.6 MG TABS tablet, Take 2 tablets (17.2 mg total) by mouth daily. (Patient taking differently: Take 2 tablets by mouth daily as needed. ), Disp: 60 each, Rfl: 2 .  sertraline (ZOLOFT) 100 MG tablet, Take 100 mg by mouth at bedtime. , Disp: , Rfl: 2 .  sucralfate (CARAFATE) 1 GM/10ML suspension, Take 10 mLs (1 g total) by mouth 4 (four) times daily -  with meals and at bedtime., Disp: 420 mL, Rfl: 0 .  tamsulosin (FLOMAX) 0.4 MG CAPS capsule, Take 0.4 mg by mouth daily after breakfast. , Disp: , Rfl:  .  zolpidem (AMBIEN) 10 MG tablet, Take 1 tablet (10 mg total) by mouth at bedtime as needed. (Patient taking differently: Take 5 mg by mouth at bedtime. ), Disp: 30 tablet, Rfl: 3 .  benzonatate (TESSALON PERLES) 100 MG capsule, Take 1 capsule (100 mg total) by mouth 3 (three) times daily as needed for cough. (Patient not taking: Reported on 07/02/2016), Disp: 30 capsule, Rfl: 0 .  carisoprodol (SOMA) 350 MG tablet, Take 1 tablet (350 mg total) by mouth 4 (four) times daily as needed for muscle spasms. (Patient not taking: Reported on 07/02/2016), Disp: 90 tablet, Rfl: 3 .  triamcinolone (KENALOG) 0.1 % paste, Use as directed 1 application in the mouth or throat 2 (two) times daily. (Patient not taking: Reported on 07/02/2016), Disp: 5 g, Rfl: 12  Allergies: No Known Allergies  Past Medical History, Surgical history, Social history, and Family History were reviewed and updated.  Review of Systems: As above  Physical Exam:  weight is 172 lb 12.8 oz (78.4 kg). His oral temperature is 98.1 F (36.7 C). His blood pressure is 113/65 and his pulse is 88. His respiration is 20.   Wt Readings from Last 3  Encounters:  07/02/16 172 lb 12.8 oz (78.4 kg)  06/20/16 170 lb (77.1 kg)  06/18/16 171 lb 12 oz (77.9 kg)     Head and neck exam shows no ocular or oral lesions. He has no palpable cervical or supraclavicular lymph nodes. He has no palpable thyroid. Lungs are clear bilaterally. Cardiac exam regular rate and rhythm with no murmurs, rubs or bruits. Abdomen is soft. He has good bowel sounds. There is no fluid wave. There is no palpable liver or spleen tip. Back exam  shows some tenderness in the midthoracic spine. There is some tenderness along the right thoracic paravertebral muscles. Extremities shows no clubbing, cyanosis or edema. He has good range of motion of his joints. He has good strength in his extremities. Neurological exam shows no focal neurological deficits. Skin exam shows no rashes, ecchymoses or petechia.   Lab Results  Component Value Date   WBC 6.0 07/02/2016   HGB 12.3 (L) 07/02/2016   HCT 36.6 (L) 07/02/2016   MCV 95 07/02/2016   PLT 223 07/02/2016     Chemistry      Component Value Date/Time   NA 141 07/02/2016 0935   NA 136 02/22/2016 0804   K 3.9 07/02/2016 0935   K 3.8 02/22/2016 0804   CL 106 07/02/2016 0935   CO2 25 07/02/2016 0935   CO2 22 02/22/2016 0804   BUN 13 07/02/2016 0935   BUN 16.4 02/22/2016 0804   CREATININE 1.0 07/02/2016 0935   CREATININE 1.0 02/22/2016 0804      Component Value Date/Time   CALCIUM 8.7 07/02/2016 0935   CALCIUM 9.8 02/22/2016 0804   ALKPHOS 76 07/02/2016 0935   ALKPHOS 95 02/22/2016 0804   AST 34 07/02/2016 0935   AST 19 02/22/2016 0804   ALT 38 07/02/2016 0935   ALT 24 02/22/2016 0804   BILITOT 0.60 07/02/2016 0935   BILITOT 0.41 02/22/2016 0804         Impression and Plan: Mr. Hammerschmidt is a 60 year old white male. He actually looks to be in good shape. He has IgG Kappa Myeloma.  Given that the transplant will be in January, we will finish up his induction therapy next week. It will be his last Velcade. Again, I  will have him off the Revlimid.   I will probably see him back in one month. I think this would be reasonable. We will then know where he stands with his transplant date.   He really has done nicely. He is responded as expected. I'm just thankful that he is able to do more now.   I spent about 25 minutes with he and his wife. I over my perspective on the transplant and some of the side effects that he may have. He really likes Dr. Laverta Baltimore.  [name], MD 12/20/201710:31 AM

## 2016-07-03 LAB — IGG, IGA, IGM
IGA/IMMUNOGLOBULIN A, SERUM: 57 mg/dL — AB (ref 90–386)
IGG (IMMUNOGLOBIN G), SERUM: 705 mg/dL (ref 700–1600)
IgM, Qn, Serum: 25 mg/dL (ref 20–172)

## 2016-07-03 LAB — KAPPA/LAMBDA LIGHT CHAINS
IG LAMBDA FREE LIGHT CHAIN: 7.6 mg/L (ref 5.7–26.3)
Ig Kappa Free Light Chain: 19.6 mg/L — ABNORMAL HIGH (ref 3.3–19.4)
Kappa/Lambda FluidC Ratio: 2.58 — ABNORMAL HIGH (ref 0.26–1.65)

## 2016-07-09 ENCOUNTER — Other Ambulatory Visit (HOSPITAL_BASED_OUTPATIENT_CLINIC_OR_DEPARTMENT_OTHER): Payer: 59

## 2016-07-09 ENCOUNTER — Ambulatory Visit (HOSPITAL_BASED_OUTPATIENT_CLINIC_OR_DEPARTMENT_OTHER): Payer: 59

## 2016-07-09 VITALS — BP 115/65 | HR 70 | Temp 98.2°F | Resp 20

## 2016-07-09 DIAGNOSIS — Z5112 Encounter for antineoplastic immunotherapy: Secondary | ICD-10-CM | POA: Diagnosis not present

## 2016-07-09 DIAGNOSIS — C9000 Multiple myeloma not having achieved remission: Secondary | ICD-10-CM

## 2016-07-09 LAB — CBC WITH DIFFERENTIAL (CANCER CENTER ONLY)
BASO#: 0 10*3/uL (ref 0.0–0.2)
BASO%: 0.5 % (ref 0.0–2.0)
EOS%: 5.5 % (ref 0.0–7.0)
Eosinophils Absolute: 0.2 10*3/uL (ref 0.0–0.5)
HCT: 36 % — ABNORMAL LOW (ref 38.7–49.9)
HGB: 11.8 g/dL — ABNORMAL LOW (ref 13.0–17.1)
LYMPH#: 1.1 10*3/uL (ref 0.9–3.3)
LYMPH%: 29.5 % (ref 14.0–48.0)
MCH: 31.5 pg (ref 28.0–33.4)
MCHC: 32.8 g/dL (ref 32.0–35.9)
MCV: 96 fL (ref 82–98)
MONO#: 0.4 10*3/uL (ref 0.1–0.9)
MONO%: 9.6 % (ref 0.0–13.0)
NEUT#: 2 10*3/uL (ref 1.5–6.5)
NEUT%: 54.9 % (ref 40.0–80.0)
PLATELETS: 185 10*3/uL (ref 145–400)
RBC: 3.75 10*6/uL — ABNORMAL LOW (ref 4.20–5.70)
RDW: 14.9 % (ref 11.1–15.7)
WBC: 3.7 10*3/uL — ABNORMAL LOW (ref 4.0–10.0)

## 2016-07-09 LAB — CMP (CANCER CENTER ONLY)
ALK PHOS: 69 U/L (ref 26–84)
ALT: 37 U/L (ref 10–47)
AST: 35 U/L (ref 11–38)
Albumin: 3.2 g/dL — ABNORMAL LOW (ref 3.3–5.5)
BILIRUBIN TOTAL: 0.6 mg/dL (ref 0.20–1.60)
BUN: 9 mg/dL (ref 7–22)
CO2: 24 mEq/L (ref 18–33)
CREATININE: 1.2 mg/dL (ref 0.6–1.2)
Calcium: 8.5 mg/dL (ref 8.0–10.3)
Chloride: 109 mEq/L — ABNORMAL HIGH (ref 98–108)
GLUCOSE: 141 mg/dL — AB (ref 73–118)
Potassium: 3.9 mEq/L (ref 3.3–4.7)
Sodium: 138 mEq/L (ref 128–145)
Total Protein: 6.2 g/dL — ABNORMAL LOW (ref 6.4–8.1)

## 2016-07-09 LAB — PROTEIN ELECTROPHORESIS, SERUM, WITH REFLEX
A/G RATIO SPE: 1.4 (ref 0.7–1.7)
ALBUMIN: 3.7 g/dL (ref 2.9–4.4)
Alpha 1: 0.3 g/dL (ref 0.0–0.4)
Alpha 2: 0.7 g/dL (ref 0.4–1.0)
BETA: 1 g/dL (ref 0.7–1.3)
Gamma Globulin: 0.8 g/dL (ref 0.4–1.8)
Globulin, Total: 2.7 g/dL (ref 2.2–3.9)
INTERPRETATION(SEE BELOW): 0
M-Spike, %: 0.4 g/dL — ABNORMAL HIGH
TOTAL PROTEIN: 6.4 g/dL (ref 6.0–8.5)

## 2016-07-09 LAB — TECHNOLOGIST REVIEW CHCC SATELLITE

## 2016-07-09 MED ORDER — PROCHLORPERAZINE MALEATE 10 MG PO TABS
10.0000 mg | ORAL_TABLET | Freq: Once | ORAL | Status: AC
  Administered 2016-07-09: 10 mg via ORAL

## 2016-07-09 MED ORDER — BORTEZOMIB CHEMO SQ INJECTION 3.5 MG (2.5MG/ML)
1.3000 mg/m2 | Freq: Once | INTRAMUSCULAR | Status: AC
  Administered 2016-07-09: 2.5 mg via SUBCUTANEOUS
  Filled 2016-07-09: qty 2.5

## 2016-07-09 MED ORDER — PROCHLORPERAZINE MALEATE 10 MG PO TABS
ORAL_TABLET | ORAL | Status: AC
  Filled 2016-07-09: qty 1

## 2016-07-09 NOTE — Patient Instructions (Signed)

## 2016-07-14 DIAGNOSIS — Z5189 Encounter for other specified aftercare: Secondary | ICD-10-CM

## 2016-07-14 HISTORY — DX: Encounter for other specified aftercare: Z51.89

## 2016-07-15 ENCOUNTER — Encounter: Payer: Self-pay | Admitting: Radiation Oncology

## 2016-07-15 ENCOUNTER — Other Ambulatory Visit: Payer: Self-pay | Admitting: *Deleted

## 2016-07-15 MED ORDER — AMOXICILLIN 500 MG PO CAPS: 500.0000 mg | ORAL_CAPSULE | Freq: Three times a day (TID) | ORAL | 0 refills | Status: DC

## 2016-07-15 NOTE — Progress Notes (Signed)
Paperwork (unum) received 1/2, given to nurse 1/3

## 2016-07-16 ENCOUNTER — Ambulatory Visit: Payer: 59

## 2016-07-16 ENCOUNTER — Other Ambulatory Visit: Payer: 59

## 2016-07-16 ENCOUNTER — Ambulatory Visit: Payer: 59 | Admitting: Hematology & Oncology

## 2016-07-18 ENCOUNTER — Telehealth: Payer: Self-pay | Admitting: *Deleted

## 2016-07-18 ENCOUNTER — Ambulatory Visit (HOSPITAL_BASED_OUTPATIENT_CLINIC_OR_DEPARTMENT_OTHER)
Admission: RE | Admit: 2016-07-18 | Discharge: 2016-07-18 | Disposition: A | Discharge status: Home / Self Care | Payer: 59 | Source: Ambulatory Visit | Attending: Family | Admitting: Family

## 2016-07-18 ENCOUNTER — Other Ambulatory Visit: Payer: Self-pay | Admitting: Family

## 2016-07-18 DIAGNOSIS — I82811 Embolism and thrombosis of superficial veins of right lower extremities: Secondary | ICD-10-CM | POA: Diagnosis not present

## 2016-07-18 DIAGNOSIS — C9000 Multiple myeloma not having achieved remission: Secondary | ICD-10-CM

## 2016-07-18 DIAGNOSIS — M7989 Other specified soft tissue disorders: Secondary | ICD-10-CM

## 2016-07-18 DIAGNOSIS — I8290 Acute embolism and thrombosis of unspecified vein: Secondary | ICD-10-CM

## 2016-07-18 MED ORDER — RIVAROXABAN 20 MG PO TABS: 20.0000 mg | ORAL_TABLET | Freq: Every day | ORAL | 4 refills | Status: DC

## 2016-07-18 NOTE — Telephone Encounter (Signed)
Patient c/o spot on right thigh which is "bulging and tender". He has been travelling throughout the holidays and is at higher risk for DVT.   Spoke with [name]. A STAT doppler will be ordered and scheduled. Patient aware of appointment time.

## 2016-07-18 NOTE — Progress Notes (Signed)
Patient has swelling and tenderness in the right leg after travelling to [place] and back. [place] today showed a superficial thrombosis/thrombophlebitis beginning at the right SFJ extending into the right GSV to the distal thigh level. Also, superficial thrombus from the right SFJ barely extends into the common femoral vein. Despite this, the common femoral vein remains patent and compressible. No significant femoral popliteal DVT. He had been on full dose aspirin. We will stop this and have him start Xarelto 20 mg PO daily. He is in agreement with the plan and will be back in for follow-up next week with Dr. Marin Olp on the 15th.

## 2016-07-21 DIAGNOSIS — Z029 Encounter for administrative examinations, unspecified: Secondary | ICD-10-CM | POA: Diagnosis not present

## 2016-07-21 DIAGNOSIS — I080 Rheumatic disorders of both mitral and aortic valves: Secondary | ICD-10-CM | POA: Diagnosis not present

## 2016-07-21 DIAGNOSIS — I803 Phlebitis and thrombophlebitis of lower extremities, unspecified: Secondary | ICD-10-CM | POA: Insufficient documentation

## 2016-07-21 DIAGNOSIS — K449 Diaphragmatic hernia without obstruction or gangrene: Secondary | ICD-10-CM | POA: Diagnosis not present

## 2016-07-21 DIAGNOSIS — C9000 Multiple myeloma not having achieved remission: Secondary | ICD-10-CM | POA: Diagnosis not present

## 2016-07-21 DIAGNOSIS — R918 Other nonspecific abnormal finding of lung field: Secondary | ICD-10-CM | POA: Diagnosis not present

## 2016-07-24 ENCOUNTER — Other Ambulatory Visit: Payer: Self-pay | Admitting: *Deleted

## 2016-07-24 DIAGNOSIS — C9000 Multiple myeloma not having achieved remission: Secondary | ICD-10-CM

## 2016-07-24 DIAGNOSIS — M545 Low back pain, unspecified: Secondary | ICD-10-CM

## 2016-07-24 DIAGNOSIS — D472 Monoclonal gammopathy: Secondary | ICD-10-CM

## 2016-07-24 DIAGNOSIS — G8929 Other chronic pain: Secondary | ICD-10-CM

## 2016-07-24 MED ORDER — FENTANYL 50 MCG/HR TD PT72: 50.0000 ug | MEDICATED_PATCH | TRANSDERMAL | 0 refills | Status: DC

## 2016-07-25 ENCOUNTER — Encounter: Payer: Self-pay | Admitting: Radiation Oncology

## 2016-07-25 NOTE — Progress Notes (Signed)
Paperwork (unum) received from doctor 07/24/16, faxed to [phone], conf received

## 2016-07-28 ENCOUNTER — Ambulatory Visit (HOSPITAL_BASED_OUTPATIENT_CLINIC_OR_DEPARTMENT_OTHER): Payer: 59 | Admitting: Hematology & Oncology

## 2016-07-28 ENCOUNTER — Other Ambulatory Visit (HOSPITAL_BASED_OUTPATIENT_CLINIC_OR_DEPARTMENT_OTHER): Payer: 59

## 2016-07-28 VITALS — BP 118/87 | HR 95 | Temp 98.3°F | Resp 18 | Wt 173.8 lb

## 2016-07-28 DIAGNOSIS — M545 Low back pain: Secondary | ICD-10-CM

## 2016-07-28 DIAGNOSIS — I8290 Acute embolism and thrombosis of unspecified vein: Secondary | ICD-10-CM | POA: Diagnosis not present

## 2016-07-28 DIAGNOSIS — C9000 Multiple myeloma not having achieved remission: Secondary | ICD-10-CM

## 2016-07-28 LAB — CBC WITH DIFFERENTIAL (CANCER CENTER ONLY)
BASO#: 0 10*3/uL (ref 0.0–0.2)
BASO%: 0.4 % (ref 0.0–2.0)
EOS ABS: 0.1 10*3/uL (ref 0.0–0.5)
EOS%: 2 % (ref 0.0–7.0)
HCT: 35.7 % — ABNORMAL LOW (ref 38.7–49.9)
HEMOGLOBIN: 11.8 g/dL — AB (ref 13.0–17.1)
LYMPH#: 1.7 10*3/uL (ref 0.9–3.3)
LYMPH%: 30.5 % (ref 14.0–48.0)
MCH: 30.3 pg (ref 28.0–33.4)
MCHC: 33.1 g/dL (ref 32.0–35.9)
MCV: 92 fL (ref 82–98)
MONO#: 0.6 10*3/uL (ref 0.1–0.9)
MONO%: 10.5 % (ref 0.0–13.0)
NEUT#: 3.1 10*3/uL (ref 1.5–6.5)
NEUT%: 56.6 % (ref 40.0–80.0)
PLATELETS: 257 10*3/uL (ref 145–400)
RBC: 3.9 10*6/uL — ABNORMAL LOW (ref 4.20–5.70)
RDW: 14 % (ref 11.1–15.7)
WBC: 5.4 10*3/uL (ref 4.0–10.0)

## 2016-07-28 LAB — CMP (CANCER CENTER ONLY)
ALBUMIN: 4.1 g/dL (ref 3.3–5.5)
ALT(SGPT): 34 U/L (ref 10–47)
AST: 27 U/L (ref 11–38)
Alkaline Phosphatase: 73 U/L (ref 26–84)
BUN, Bld: 14 mg/dL (ref 7–22)
CHLORIDE: 105 meq/L (ref 98–108)
CO2: 25 meq/L (ref 18–33)
CREATININE: 0.9 mg/dL (ref 0.6–1.2)
Calcium: 9.1 mg/dL (ref 8.0–10.3)
Glucose, Bld: 87 mg/dL (ref 73–118)
POTASSIUM: 3.9 meq/L (ref 3.3–4.7)
SODIUM: 141 meq/L (ref 128–145)
Total Bilirubin: 0.6 mg/dl (ref 0.20–1.60)
Total Protein: 6.9 g/dL (ref 6.4–8.1)

## 2016-07-28 NOTE — Progress Notes (Signed)
Hematology and Oncology Follow Up Visit  Jorge [name] [id number] 07-Jan-1956 61 y.o. 07/28/2016   Principle Diagnosis:  IgG Kappa myeloma - Hyperdiploid/+11 DVT of the LEFT and RIGHT leg Current Therapy:    S/p cycle #5 of RVD  Zometa 4 mg IV q month Xarelto 20 mg po q day - started on 03/21/2016 Palliative XRT to the back - start on 06/09/2016     Interim History:  Jorge [name] is back for follow-up. He went to [facility] for his transplant workup. They did a bone marrow biopsy on him. Apparently, there was 15% plasma cells. Dr. Laverta Baltimore felt that this was too high to going to transplant. As such, he will give him Cytoxan. I had to suspect that this is high dose Cytoxan. I think he will uses it also prior to his stem cells.   Jorge [name] is complaining of some pain in the mid back. This radiates around both sides. I think we probably need to do an MRI to make sure there is no compression fracture.   Shockingly enough, he got back from [place]. He had another thrombus. He had pain in his right leg. A superficial thrombus was noted. The deep veins looked good.  I have [name]. I think he will need full dose Xarelto for a while. I told to stop the Xarelto on Tuesday for the Hickman catheter to be placed on Thursday. I then told him to restart the Xarelto on Friday.  I would keep him on Xarelto throughout his transplant and probably [place] for another 6 months. I probably would repeat his Doppler in 3 months.  I need to make sure that he stops his aspirin.  When he was at [facility], his M spike was down to 0.35 g/dL. This continues to improve.  He has had no problems with bowels or bladder. He's had no headache. He's had no rashes.  Overall, his performance status is ECOG 1.   Medications:  Current Outpatient Prescriptions:  .  acetaminophen (TYLENOL) 500 MG tablet, Take 500 mg by mouth every 6 (six) hours as needed (for pain/fever.). , Disp: , Rfl:  .  amoxicillin (AMOXIL) 500 MG capsule, Take  1 capsule (500 mg total) by mouth 3 (three) times daily., Disp: 21 capsule, Rfl: 0 .  benzonatate (TESSALON PERLES) 100 MG capsule, Take 1 capsule (100 mg total) by mouth 3 (three) times daily as needed for cough. (Patient not taking: Reported on 07/02/2016), Disp: 30 capsule, Rfl: 0 .  calcium carbonate (TUMS - DOSED IN MG ELEMENTAL CALCIUM) 500 MG chewable tablet, Chew 1 tablet by mouth as needed for indigestion or heartburn., Disp: , Rfl:  .  carisoprodol (SOMA) 350 MG tablet, Take 1 tablet (350 mg total) by mouth 4 (four) times daily as needed for muscle spasms. (Patient not taking: Reported on 07/02/2016), Disp: 90 tablet, Rfl: 3 .  Cholecalciferol (VITAMIN D-3) 5000 units TABS, Take 5,000 mg by mouth daily., Disp: , Rfl:  .  dexamethasone (DECADRON) 4 MG tablet, Take 5 pills at one time with food every week (Patient taking differently: Take 20 mg by mouth every Wednesday. Take 5 pills at one time with food every week), Disp: 60 tablet, Rfl: 4 .  famciclovir (FAMVIR) 500 MG tablet, Take 1 tablet (500 mg total) by mouth daily. (Patient taking differently: Take 500 mg by mouth every morning. ), Disp: 30 tablet, Rfl: 8 .  fentaNYL (DURAGESIC - DOSED MCG/HR) 50 MCG/HR, Place 1 patch (50 mcg total) onto the skin  every other day., Disp: 15 patch, Rfl: 0 .  HYDROcodone-acetaminophen (NORCO) 10-325 MG tablet, Take 0.5-1 tablets by mouth every 8 (eight) hours as needed for moderate pain., Disp: 60 tablet, Rfl: 0 .  lenalidomide (REVLIMID) 25 MG capsule, Take 1 capsule daily for 21 days on rthen 7 days off. Josem Kaufmann #[id number] (Patient taking differently: Take 25 mg by mouth every evening. Take 1 capsule daily for 21 days on rthen 7 days off. Josem Kaufmann #[id number]), Disp: 21 capsule, Rfl: 0 .  LORazepam (ATIVAN) 0.5 MG tablet, Take 1 tablet (0.5 mg total) by mouth every 6 (six) hours as needed (Nausea or vomiting)., Disp: 30 tablet, Rfl: 0 .  magic mouthwash w/lidocaine SOLN, Take 5 mLs by mouth 3 (three) times daily as  needed for mouth pain., Disp: 450 mL, Rfl: 3 .  magnesium oxide (MAG-OX) 400 MG tablet, Take 400 mg by mouth daily., Disp: , Rfl:  .  ondansetron (ZOFRAN-ODT) 8 MG disintegrating tablet, Take 8 mg by mouth every 8 (eight) hours as needed., Disp: , Rfl: 1 .  Oxycodone HCl 10 MG TABS, Take 1 tablet (10 mg total) by mouth every 4 (four) hours as needed., Disp: 30 tablet, Rfl: 0 .  polyethylene glycol powder (MIRALAX) powder, Take 17 g by mouth daily. (Patient taking differently: Take 17 g by mouth as needed. ), Disp: 850 g, Rfl: 3 .  Probiotic Product (PROBIOTIC PO), Take 1 capsule by mouth daily., Disp: , Rfl:  .  prochlorperazine (COMPAZINE) 10 MG tablet, Take 1 tablet (10 mg total) by mouth every 6 (six) hours as needed for nausea or vomiting., Disp: 30 tablet, Rfl: 3 .  ranitidine (ZANTAC) 150 MG tablet, Take 150 mg by mouth as needed for heartburn., Disp: , Rfl:  .  rivaroxaban (XARELTO) 20 MG TABS tablet, Take 1 tablet (20 mg total) by mouth daily with supper., Disp: 30 tablet, Rfl: 4 .  senna (SENOKOT) 8.6 MG TABS tablet, Take 2 tablets (17.2 mg total) by mouth daily. (Patient taking differently: Take 2 tablets by mouth daily as needed. ), Disp: 60 each, Rfl: 2 .  sertraline (ZOLOFT) 100 MG tablet, Take 100 mg by mouth at bedtime. , Disp: , Rfl: 2 .  sucralfate (CARAFATE) 1 GM/10ML suspension, Take 10 mLs (1 g total) by mouth 4 (four) times daily -  with meals and at bedtime., Disp: 420 mL, Rfl: 0 .  tamsulosin (FLOMAX) 0.4 MG CAPS capsule, Take 0.4 mg by mouth daily after breakfast. , Disp: , Rfl:  .  triamcinolone (KENALOG) 0.1 % paste, Use as directed 1 application in the mouth or throat 2 (two) times daily. (Patient not taking: Reported on 07/02/2016), Disp: 5 g, Rfl: 12 .  zolpidem (AMBIEN) 10 MG tablet, Take 1 tablet (10 mg total) by mouth at bedtime as needed. (Patient taking differently: Take 5 mg by mouth at bedtime. ), Disp: 30 tablet, Rfl: 3  Allergies: No Known Allergies  Past  Medical History, Surgical history, Social history, and Family History were reviewed and updated.  Review of Systems: As above  Physical Exam:  weight is 173 lb 12.8 oz (78.8 kg). His oral temperature is 98.3 F (36.8 C). His blood pressure is 118/87 and his pulse is 95. His respiration is 18 and oxygen saturation is 100%.   Wt Readings from Last 3 Encounters:  07/28/16 173 lb 12.8 oz (78.8 kg)  07/02/16 172 lb 12.8 oz (78.4 kg)  06/20/16 170 lb (77.1 kg)     Head and neck  exam shows no ocular or oral lesions. He has no palpable cervical or supraclavicular lymph nodes. He has no palpable thyroid. Lungs are clear bilaterally. Cardiac exam regular rate and rhythm with no murmurs, rubs or bruits. Abdomen is soft. He has good bowel sounds. There is no fluid wave. There is no palpable liver or spleen tip. Back exam shows some tenderness in the midthoracic spine. There is some tenderness along the right thoracic paravertebral muscles. Extremities shows no clubbing, cyanosis or edema. He has good range of motion of his joints. He has good strength in his extremities. Neurological exam shows no focal neurological deficits. Skin exam shows no rashes, ecchymoses or petechia.   Lab Results  Component Value Date   WBC 5.4 07/28/2016   HGB 11.8 (L) 07/28/2016   HCT 35.7 (L) 07/28/2016   MCV 92 07/28/2016   PLT 257 07/28/2016     Chemistry      Component Value Date/Time   NA 141 07/28/2016 1409   NA 136 02/22/2016 0804   K 3.9 07/28/2016 1409   K 3.8 02/22/2016 0804   CL 105 07/28/2016 1409   CO2 25 07/28/2016 1409   CO2 22 02/22/2016 0804   BUN 14 07/28/2016 1409   BUN 16.4 02/22/2016 0804   CREATININE 0.9 07/28/2016 1409   CREATININE 1.0 02/22/2016 0804      Component Value Date/Time   CALCIUM 9.1 07/28/2016 1409   CALCIUM 9.8 02/22/2016 0804   ALKPHOS 73 07/28/2016 1409   ALKPHOS 95 02/22/2016 0804   AST 27 07/28/2016 1409   AST 19 02/22/2016 0804   ALT 34 07/28/2016 1409   ALT  24 02/22/2016 0804   BILITOT 0.60 07/28/2016 1409   BILITOT 0.41 02/22/2016 0804         Impression and Plan: Jorge [name] is a 61 year old white male. He actually looks to be in good shape. He has IgG Kappa Myeloma.  He will go back to [facility] on Thursday. He will have the Cytoxan I suspect over the weekend. I'm sure [facility] will let us know about his blood work that he will need. He will be doing Neupogen at home.   We will get an MRI of his thoracic spine. I will try to get this before he goes to [facility].   We will see him back whenever [facility] transplant team tells [place] to see him. We'll definitely be available for lab work, antibodies, etc.   [name], MD 1/15/20186:04 PM

## 2016-07-29 LAB — KAPPA/LAMBDA LIGHT CHAINS
IG KAPPA FREE LIGHT CHAIN: 9.9 mg/L (ref 3.3–19.4)
Ig Lambda Free Light Chain: 6.5 mg/L (ref 5.7–26.3)
Kappa/Lambda FluidC Ratio: 1.52 (ref 0.26–1.65)

## 2016-07-29 LAB — IGG, IGA, IGM
IGG (IMMUNOGLOBIN G), SERUM: 763 mg/dL (ref 700–1600)
IgA, Qn, Serum: 61 mg/dL — ABNORMAL LOW (ref 90–386)
IgM, Qn, Serum: 19 mg/dL — ABNORMAL LOW (ref 20–172)

## 2016-07-30 ENCOUNTER — Ambulatory Visit (HOSPITAL_COMMUNITY)
Admission: RE | Admit: 2016-07-30 | Discharge: 2016-07-30 | Disposition: A | Discharge status: Home / Self Care | Payer: 59 | Source: Ambulatory Visit | Attending: Hematology & Oncology | Admitting: Hematology & Oncology

## 2016-07-30 ENCOUNTER — Ambulatory Visit: Payer: 59

## 2016-07-30 ENCOUNTER — Other Ambulatory Visit: Payer: 59

## 2016-07-30 DIAGNOSIS — M4854XA Collapsed vertebra, not elsewhere classified, thoracic region, initial encounter for fracture: Secondary | ICD-10-CM | POA: Diagnosis not present

## 2016-07-30 DIAGNOSIS — C9000 Multiple myeloma not having achieved remission: Secondary | ICD-10-CM | POA: Diagnosis not present

## 2016-07-30 DIAGNOSIS — M4804 Spinal stenosis, thoracic region: Secondary | ICD-10-CM | POA: Diagnosis not present

## 2016-07-30 MED ORDER — GADOBENATE DIMEGLUMINE 529 MG/ML IV SOLN
16.0000 mL | Freq: Once | INTRAVENOUS | Status: AC | PRN
  Administered 2016-07-30: 16 mL via INTRAVENOUS

## 2016-07-31 ENCOUNTER — Ambulatory Visit: Payer: 59 | Admitting: Radiation Oncology

## 2016-07-31 DIAGNOSIS — I80299 Phlebitis and thrombophlebitis of other deep vessels of unspecified lower extremity: Secondary | ICD-10-CM | POA: Diagnosis not present

## 2016-07-31 DIAGNOSIS — C9001 Multiple myeloma in remission: Secondary | ICD-10-CM | POA: Diagnosis not present

## 2016-07-31 DIAGNOSIS — Z029 Encounter for administrative examinations, unspecified: Secondary | ICD-10-CM | POA: Diagnosis not present

## 2016-07-31 DIAGNOSIS — C9000 Multiple myeloma not having achieved remission: Secondary | ICD-10-CM | POA: Diagnosis not present

## 2016-07-31 DIAGNOSIS — I803 Phlebitis and thrombophlebitis of lower extremities, unspecified: Secondary | ICD-10-CM | POA: Diagnosis not present

## 2016-07-31 LAB — PROTEIN ELECTROPHORESIS, SERUM, WITH REFLEX
A/G RATIO SPE: 1.5 (ref 0.7–1.7)
ALBUMIN: 3.8 g/dL (ref 2.9–4.4)
Alpha 1: 0.3 g/dL (ref 0.0–0.4)
Alpha 2: 0.7 g/dL (ref 0.4–1.0)
BETA: 1 g/dL (ref 0.7–1.3)
GAMMA GLOBULIN: 0.7 g/dL (ref 0.4–1.8)
GLOBULIN, TOTAL: 2.6 g/dL (ref 2.2–3.9)
Interpretation(See Below): 0
M-Spike, %: 0.5 g/dL — ABNORMAL HIGH
TOTAL PROTEIN: 6.4 g/dL (ref 6.0–8.5)

## 2016-08-01 ENCOUNTER — Other Ambulatory Visit: Payer: Self-pay | Admitting: Family

## 2016-08-01 DIAGNOSIS — C9001 Multiple myeloma in remission: Secondary | ICD-10-CM | POA: Diagnosis not present

## 2016-08-01 DIAGNOSIS — Z5111 Encounter for antineoplastic chemotherapy: Secondary | ICD-10-CM | POA: Diagnosis not present

## 2016-08-01 DIAGNOSIS — N200 Calculus of kidney: Secondary | ICD-10-CM | POA: Diagnosis not present

## 2016-08-01 NOTE — Progress Notes (Signed)
Spoke with Ebony Hail at [place] and we will set the patient up for labs Monday, Wednesday and Friday next week as well as one dose of Velcade. POF was sent and Orson Slick will get these appointments scheduled for him today.

## 2016-08-02 DIAGNOSIS — C9001 Multiple myeloma in remission: Secondary | ICD-10-CM | POA: Diagnosis not present

## 2016-08-04 ENCOUNTER — Other Ambulatory Visit (HOSPITAL_BASED_OUTPATIENT_CLINIC_OR_DEPARTMENT_OTHER): Payer: 59

## 2016-08-04 ENCOUNTER — Ambulatory Visit (HOSPITAL_BASED_OUTPATIENT_CLINIC_OR_DEPARTMENT_OTHER): Payer: 59

## 2016-08-04 ENCOUNTER — Other Ambulatory Visit: Payer: 59

## 2016-08-04 ENCOUNTER — Ambulatory Visit: Payer: 59

## 2016-08-04 ENCOUNTER — Ambulatory Visit (HOSPITAL_BASED_OUTPATIENT_CLINIC_OR_DEPARTMENT_OTHER): Payer: 59 | Admitting: Hematology & Oncology

## 2016-08-04 VITALS — BP 125/71 | HR 68 | Temp 98.1°F | Resp 18

## 2016-08-04 DIAGNOSIS — C9000 Multiple myeloma not having achieved remission: Secondary | ICD-10-CM | POA: Diagnosis not present

## 2016-08-04 DIAGNOSIS — D472 Monoclonal gammopathy: Secondary | ICD-10-CM

## 2016-08-04 DIAGNOSIS — C9001 Multiple myeloma in remission: Secondary | ICD-10-CM | POA: Diagnosis not present

## 2016-08-04 DIAGNOSIS — I82403 Acute embolism and thrombosis of unspecified deep veins of lower extremity, bilateral: Secondary | ICD-10-CM | POA: Diagnosis not present

## 2016-08-04 DIAGNOSIS — G8929 Other chronic pain: Secondary | ICD-10-CM

## 2016-08-04 DIAGNOSIS — M545 Low back pain: Secondary | ICD-10-CM | POA: Diagnosis not present

## 2016-08-04 DIAGNOSIS — Z5112 Encounter for antineoplastic immunotherapy: Secondary | ICD-10-CM

## 2016-08-04 LAB — CBC WITH DIFFERENTIAL (CANCER CENTER ONLY)
BASO#: 0 10*3/uL (ref 0.0–0.2)
BASO%: 0.3 % (ref 0.0–2.0)
EOS ABS: 0.1 10*3/uL (ref 0.0–0.5)
EOS%: 1.7 % (ref 0.0–7.0)
HCT: 31.6 % — ABNORMAL LOW (ref 38.7–49.9)
HEMOGLOBIN: 10.2 g/dL — AB (ref 13.0–17.1)
LYMPH#: 0.4 10*3/uL — ABNORMAL LOW (ref 0.9–3.3)
LYMPH%: 11.6 % — ABNORMAL LOW (ref 14.0–48.0)
MCH: 29.6 pg (ref 28.0–33.4)
MCHC: 32.3 g/dL (ref 32.0–35.9)
MCV: 92 fL (ref 82–98)
MONO#: 0.2 10*3/uL (ref 0.1–0.9)
MONO%: 7 % (ref 0.0–13.0)
NEUT%: 79.4 % (ref 40.0–80.0)
NEUTROS ABS: 2.4 10*3/uL (ref 1.5–6.5)
Platelets: 144 10*3/uL — ABNORMAL LOW (ref 145–400)
RBC: 3.45 10*6/uL — AB (ref 4.20–5.70)
RDW: 13.9 % (ref 11.1–15.7)
WBC: 3 10*3/uL — AB (ref 4.0–10.0)

## 2016-08-04 LAB — CMP (CANCER CENTER ONLY)
ALBUMIN: 3.5 g/dL (ref 3.3–5.5)
ALT: 30 U/L (ref 10–47)
AST: 21 U/L (ref 11–38)
Alkaline Phosphatase: 55 U/L (ref 26–84)
BILIRUBIN TOTAL: 0.7 mg/dL (ref 0.20–1.60)
BUN: 13 mg/dL (ref 7–22)
CO2: 27 mEq/L (ref 18–33)
CREATININE: 0.8 mg/dL (ref 0.6–1.2)
Calcium: 8.8 mg/dL (ref 8.0–10.3)
Chloride: 107 mEq/L (ref 98–108)
Glucose, Bld: 138 mg/dL — ABNORMAL HIGH (ref 73–118)
Potassium: 3.6 mEq/L (ref 3.3–4.7)
SODIUM: 139 meq/L (ref 128–145)
TOTAL PROTEIN: 6.1 g/dL — AB (ref 6.4–8.1)

## 2016-08-04 LAB — TECHNOLOGIST REVIEW CHCC SATELLITE

## 2016-08-04 LAB — LACTATE DEHYDROGENASE: LDH: 159 U/L (ref 125–245)

## 2016-08-04 MED ORDER — BORTEZOMIB CHEMO SQ INJECTION 3.5 MG (2.5MG/ML)
1.3000 mg/m2 | Freq: Once | INTRAMUSCULAR | Status: AC
  Administered 2016-08-04: 2.5 mg via SUBCUTANEOUS
  Filled 2016-08-04: qty 2.5

## 2016-08-04 MED ORDER — PROCHLORPERAZINE MALEATE 10 MG PO TABS: 10.0000 mg | ORAL_TABLET | Freq: Once | ORAL | Status: DC

## 2016-08-04 NOTE — Patient Instructions (Signed)

## 2016-08-04 NOTE — Progress Notes (Signed)
  CBC and CMET reviewed with Dr. Marin Olp.  OK to treat.

## 2016-08-04 NOTE — Progress Notes (Signed)
Hematology and Oncology Follow Up Visit  Jorge [name] [id number] May 25, 1956 61 y.o. 08/04/2016   Principle Diagnosis:  IgG Kappa myeloma - Hyperdiploid/+11 DVT of the LEFT and RIGHT leg Current Therapy:    S/p cycle #5 of RVD  Zometa 4 mg IV q month Xarelto 20 mg po q day - started on 03/21/2016 Palliative XRT to the back - start on 06/09/2016     Interim History:  Jorge [name] is back for follow-up. He went to [facility] for his transplant workup. He did have his Hickman catheter placed. He did get high-dose Cytoxan. They still wanted to have a dose of Velcade. I will give him one today.   We saw him here, we did do an MRI of his back. This is of the thoracic spine. He had a new compression fracture at T8. This caused 50-75% wedging. Jorge [name] and his wife brought a disc of the MRI out to [facility]. The doctors there will take care of the compression fracture.   He got the Cytoxan on January 19. So far, he has tolerated this well.   There is no nausea or vomiting. He has had no rashes. He has had no mouth sores. He has had no diarrhea.  He is supposed to start high-dose Neupogen tomorrow as 780 g.  Currently, his performance status is ECOG 1. He really is not complaining much in the way of mid back pain which is nice to see.   Medications:  Current Outpatient Prescriptions:  .  calcium carbonate (TUMS - DOSED IN MG ELEMENTAL CALCIUM) 500 MG chewable tablet, Chew 1 tablet by mouth as needed for indigestion or heartburn., Disp: , Rfl:  .  carisoprodol (SOMA) 350 MG tablet, Take 1 tablet (350 mg total) by mouth 4 (four) times daily as needed for muscle spasms., Disp: 90 tablet, Rfl: 3 .  Cholecalciferol (VITAMIN D-3) 5000 units TABS, Take 5,000 mg by mouth daily., Disp: , Rfl:  .  ciprofloxacin (CIPRO) 500 MG tablet, Take 500 mg by mouth 2 (two) times daily., Disp: , Rfl:  .  fentaNYL (DURAGESIC - DOSED MCG/HR) 50 MCG/HR, Place 1 patch (50 mcg total) onto the skin every other day., Disp: 15  patch, Rfl: 0 .  HYDROcodone-acetaminophen (NORCO) 10-325 MG tablet, Take 0.5-1 tablets by mouth every 8 (eight) hours as needed for moderate pain., Disp: 60 tablet, Rfl: 0 .  LORazepam (ATIVAN) 0.5 MG tablet, Take 1 tablet (0.5 mg total) by mouth every 6 (six) hours as needed (Nausea or vomiting)., Disp: 30 tablet, Rfl: 0 .  magic mouthwash w/lidocaine SOLN, Take 5 mLs by mouth 3 (three) times daily as needed for mouth pain., Disp: 450 mL, Rfl: 3 .  magnesium oxide (MAG-OX) 400 MG tablet, Take 400 mg by mouth daily., Disp: , Rfl:  .  ondansetron (ZOFRAN-ODT) 8 MG disintegrating tablet, Take 8 mg by mouth every 8 (eight) hours as needed., Disp: , Rfl: 1 .  Oxycodone HCl 10 MG TABS, Take 1 tablet (10 mg total) by mouth every 4 (four) hours as needed., Disp: 30 tablet, Rfl: 0 .  polyethylene glycol powder (MIRALAX) powder, Take 17 g by mouth daily. (Patient taking differently: Take 17 g by mouth as needed. ), Disp: 850 g, Rfl: 3 .  Probiotic Product (PROBIOTIC PO), Take 1 capsule by mouth daily., Disp: , Rfl:  .  prochlorperazine (COMPAZINE) 10 MG tablet, Take 1 tablet (10 mg total) by mouth every 6 (six) hours as needed for nausea or vomiting., Disp: 30  tablet, Rfl: 3 .  ranitidine (ZANTAC) 150 MG tablet, Take 150 mg by mouth as needed for heartburn., Disp: , Rfl:  .  rivaroxaban (XARELTO) 20 MG TABS tablet, Take 1 tablet (20 mg total) by mouth daily with supper., Disp: 30 tablet, Rfl: 4 .  senna (SENOKOT) 8.6 MG TABS tablet, Take 2 tablets (17.2 mg total) by mouth daily. (Patient taking differently: Take 2 tablets by mouth daily as needed. ), Disp: 60 each, Rfl: 2 .  sertraline (ZOLOFT) 100 MG tablet, Take 100 mg by mouth at bedtime. , Disp: , Rfl: 2 .  zolpidem (AMBIEN) 10 MG tablet, Take 1 tablet (10 mg total) by mouth at bedtime as needed. (Patient taking differently: Take 5 mg by mouth at bedtime. ), Disp: 30 tablet, Rfl: 3 .  famciclovir (FAMVIR) 500 MG tablet, Take 1 tablet (500 mg total) by  mouth daily. (Patient taking differently: Take 500 mg by mouth every morning. ), Disp: 30 tablet, Rfl: 8  Allergies: No Known Allergies  Past Medical History, Surgical history, Social history, and Family History were reviewed and updated.  Review of Systems: As above  Physical Exam:  vitals were not taken for this visit.  Wt Readings from Last 3 Encounters:  07/28/16 173 lb 12.8 oz (78.8 kg)  07/02/16 172 lb 12.8 oz (78.4 kg)  06/20/16 170 lb (77.1 kg)     Head and neck exam shows no ocular or oral lesions. He has no palpable cervical or supraclavicular lymph nodes. He has no palpable thyroid. Lungs are clear bilaterally. Cardiac exam regular rate and rhythm with no murmurs, rubs or bruits. Abdomen is soft. He has good bowel sounds. There is no fluid wave. There is no palpable liver or spleen tip. Back exam shows some tenderness in the midthoracic spine. There is some tenderness along the right thoracic paravertebral muscles. Extremities shows no clubbing, cyanosis or edema. He has good range of motion of his joints. He has good strength in his extremities. Neurological exam shows no focal neurological deficits. Skin exam shows no rashes, ecchymoses or petechia.   Lab Results  Component Value Date   WBC 3.0 (L) 08/04/2016   HGB 10.2 (L) 08/04/2016   HCT 31.6 (L) 08/04/2016   MCV 92 08/04/2016   PLT 144 Few Large platelets present (L) 08/04/2016     Chemistry      Component Value Date/Time   NA 139 08/04/2016 1110   NA 136 02/22/2016 0804   K 3.6 08/04/2016 1110   K 3.8 02/22/2016 0804   CL 107 08/04/2016 1110   CO2 27 08/04/2016 1110   CO2 22 02/22/2016 0804   BUN 13 08/04/2016 1110   BUN 16.4 02/22/2016 0804   CREATININE 0.8 08/04/2016 1110   CREATININE 1.0 02/22/2016 0804      Component Value Date/Time   CALCIUM 8.8 08/04/2016 1110   CALCIUM 9.8 02/22/2016 0804   ALKPHOS 55 08/04/2016 1110   ALKPHOS 95 02/22/2016 0804   AST 21 08/04/2016 1110   AST 19 02/22/2016  0804   ALT 30 08/04/2016 1110   ALT 24 02/22/2016 0804   BILITOT 0.70 08/04/2016 1110   BILITOT 0.41 02/22/2016 0804         Impression and Plan: Jorge [name] is a 61 year old white male. He actually looks to be in good shape. He has IgG Kappa Myeloma.  He is now in the "home stretch" for transplant. Thankfully, the doctors at [facility] know about the new compression fracture at T8. His  sounds like he will need kyphoplasty and radiation. I'm sure he can get that while he is there.   We will go ahead and give him a Velcade today. He got a high-dose Cytoxan 3 days ago.  We might have to start him on the Neupogen. He is not sure when he will get the delivery of the Neupogen in the mail.   He does look quite good. I'm sure that he will do okay with transplantation.   We will plan to get him back whenever [facility] needs S2. It sounds like he will be getting lab work here 3 times a week until he gets stem cells collected.   As far as his Xarelto is concerned, I'll keep him on 20 mg a day until the 31st. At that point in time, I will then decrease the Xarelto dose to 10 mg a day. [facility] wants the Xarelto stopped if his platelet count is below 50,000.   I am just thankful that he is moving in the right direction for transplant soon.    [name], MD 1/22/20181:01 PM

## 2016-08-05 ENCOUNTER — Ambulatory Visit (HOSPITAL_BASED_OUTPATIENT_CLINIC_OR_DEPARTMENT_OTHER): Payer: 59

## 2016-08-05 VITALS — BP 123/78 | HR 77 | Temp 98.3°F | Resp 17

## 2016-08-05 DIAGNOSIS — C9000 Multiple myeloma not having achieved remission: Secondary | ICD-10-CM | POA: Diagnosis not present

## 2016-08-05 MED ORDER — FILGRASTIM 480 MCG/0.8ML IJ SOSY
780.0000 ug | PREFILLED_SYRINGE | Freq: Once | INTRAMUSCULAR | Status: AC
  Administered 2016-08-05: 780 ug via SUBCUTANEOUS
  Filled 2016-08-05: qty 1.6

## 2016-08-05 NOTE — Patient Instructions (Signed)
Filgrastim, G-CSF injection What is this medicine? FILGRASTIM, G-CSF (fil GRA stim) is a granulocyte colony-stimulating factor that stimulates the growth of neutrophils, a type of white blood cell (WBC) important in the body's fight against infection. It is used to reduce the incidence of fever and infection in patients with certain types of cancer who are receiving chemotherapy that affects the bone marrow, to stimulate blood cell production for removal of WBCs from the body prior to a bone marrow transplantation, to reduce the incidence of fever and infection in patients who have severe chronic neutropenia, and to improve survival outcomes following high-dose radiation exposure that is toxic to the bone marrow. This medicine may be used for other purposes; ask your health care provider or pharmacist if you have questions. COMMON BRAND NAME(S): Neupogen, Zarxio What should I tell my health care provider before I take this medicine? They need to know if you have any of these conditions: -kidney disease -latex allergy -ongoing radiation therapy -sickle cell disease -an unusual or allergic reaction to filgrastim, pegfilgrastim, other medicines, foods, dyes, or preservatives -pregnant or trying to get pregnant -breast-feeding How should I use this medicine? This medicine is for injection under the skin or infusion into a vein. As an infusion into a vein, it is usually given by a health care professional in a hospital or clinic setting. If you get this medicine at home, you will be taught how to prepare and give this medicine. Refer to the Instructions for Use that come with your medication packaging. Use exactly as directed. Take your medicine at regular intervals. Do not take your medicine more often than directed. It is important that you put your used needles and syringes in a special sharps container. Do not put them in a trash can. If you do not have a sharps container, call your pharmacist or  healthcare provider to get one. Talk to your pediatrician regarding the use of this medicine in children. While this drug may be prescribed for children as young as 7 months for selected conditions, precautions do apply. Overdosage: If you think you have taken too much of this medicine contact a poison control center or emergency room at once. NOTE: This medicine is only for you. Do not share this medicine with others. What if I miss a dose? It is important not to miss your dose. Call your doctor or health care professional if you miss a dose. What may interact with this medicine? This medicine may interact with the following medications: -medicines that may cause a release of neutrophils, such as lithium This list may not describe all possible interactions. Give your health care provider a list of all the medicines, herbs, non-prescription drugs, or dietary supplements you use. Also tell them if you smoke, drink alcohol, or use illegal drugs. Some items may interact with your medicine. What should I watch for while using this medicine? You may need blood work done while you are taking this medicine. What side effects may I notice from receiving this medicine? Side effects that you should report to your doctor or health care professional as soon as possible: -allergic reactions like skin rash, itching or hives, swelling of the face, lips, or tongue -dizziness or feeling faint -fever -pain, redness, or irritation at site where injected -pinpoint red spots on the skin -shortness of breath or breathing problems -signs and symptoms of kidney injury like trouble passing urine, change in the amount of urine, or red or dark-brown urine -stomach or side pain,   or pain at the shoulder -swelling -tiredness - unusual bleeding or bruising Side effects that usually do not require medical attention (report to your doctor or health care professional if they continue or are bothersome): -bone  pain -headache -muscle pain This list may not describe all possible side effects. Call your doctor for medical advice about side effects. You may report side effects to FDA at 1-800-FDA-1088. Where should I keep my medicine? Keep out of the reach of children. Store in a refrigerator between 2 and 8 degrees C (36 and 46 degrees F). Do not freeze. Keep in carton to protect from light. Throw away this medicine if vials or syringes are left out of the refrigerator for more than 24 hours. Throw away any unused medicine after the expiration date. NOTE: This sheet is a summary. It may not cover all possible information. If you have questions about this medicine, talk to your doctor, pharmacist, or health care provider.  2017 Elsevier/Gold Standard (2015-01-17 10:57:13)  

## 2016-08-06 ENCOUNTER — Ambulatory Visit (HOSPITAL_BASED_OUTPATIENT_CLINIC_OR_DEPARTMENT_OTHER): Payer: 59

## 2016-08-06 ENCOUNTER — Other Ambulatory Visit (HOSPITAL_BASED_OUTPATIENT_CLINIC_OR_DEPARTMENT_OTHER): Payer: 59

## 2016-08-06 VITALS — BP 110/77 | HR 71 | Temp 97.9°F | Resp 18

## 2016-08-06 DIAGNOSIS — C9000 Multiple myeloma not having achieved remission: Secondary | ICD-10-CM | POA: Diagnosis not present

## 2016-08-06 LAB — CBC WITH DIFFERENTIAL (CANCER CENTER ONLY)
BASO#: 0 10*3/uL (ref 0.0–0.2)
BASO%: 0 % (ref 0.0–2.0)
EOS ABS: 0.1 10*3/uL (ref 0.0–0.5)
EOS%: 1.5 % (ref 0.0–7.0)
HCT: 31.3 % — ABNORMAL LOW (ref 38.7–49.9)
HGB: 10.2 g/dL — ABNORMAL LOW (ref 13.0–17.1)
LYMPH#: 0.3 10*3/uL — ABNORMAL LOW (ref 0.9–3.3)
LYMPH%: 4.2 % — AB (ref 14.0–48.0)
MCH: 29.5 pg (ref 28.0–33.4)
MCHC: 32.6 g/dL (ref 32.0–35.9)
MCV: 91 fL (ref 82–98)
MONO#: 0.2 10*3/uL (ref 0.1–0.9)
MONO%: 2.4 % (ref 0.0–13.0)
NEUT#: 6.9 10*3/uL — ABNORMAL HIGH (ref 1.5–6.5)
NEUT%: 91.9 % — ABNORMAL HIGH (ref 40.0–80.0)
RBC: 3.46 10*6/uL — AB (ref 4.20–5.70)
RDW: 13.9 % (ref 11.1–15.7)
WBC: 7.6 10*3/uL (ref 4.0–10.0)

## 2016-08-06 LAB — CMP (CANCER CENTER ONLY)
ALT(SGPT): 28 U/L (ref 10–47)
AST: 24 U/L (ref 11–38)
Albumin: 3.6 g/dL (ref 3.3–5.5)
Alkaline Phosphatase: 58 U/L (ref 26–84)
BUN: 13 mg/dL (ref 7–22)
CO2: 27 meq/L (ref 18–33)
Calcium: 9 mg/dL (ref 8.0–10.3)
Chloride: 101 mEq/L (ref 98–108)
Creat: 1.2 mg/dl (ref 0.6–1.2)
Glucose, Bld: 120 mg/dL — ABNORMAL HIGH (ref 73–118)
POTASSIUM: 4 meq/L (ref 3.3–4.7)
Sodium: 138 mEq/L (ref 128–145)
TOTAL PROTEIN: 6.3 g/dL — AB (ref 6.4–8.1)
Total Bilirubin: 0.6 mg/dl (ref 0.20–1.60)

## 2016-08-06 LAB — TECHNOLOGIST REVIEW CHCC SATELLITE

## 2016-08-06 LAB — KAPPA/LAMBDA LIGHT CHAINS
IG KAPPA FREE LIGHT CHAIN: 9.6 mg/L (ref 3.3–19.4)
Ig Lambda Free Light Chain: 4 mg/L — ABNORMAL LOW (ref 5.7–26.3)
Kappa/Lambda FluidC Ratio: 2.4 — ABNORMAL HIGH (ref 0.26–1.65)

## 2016-08-06 MED ORDER — FILGRASTIM 480 MCG/0.8ML IJ SOSY
780.0000 ug | PREFILLED_SYRINGE | Freq: Once | INTRAMUSCULAR | Status: AC
  Administered 2016-08-06: 780 ug via SUBCUTANEOUS
  Filled 2016-08-06: qty 1.6

## 2016-08-06 NOTE — Patient Instructions (Signed)
Tbo-Filgrastim injection What is this medicine? TBO-FILGRASTIM (T B O fil GRA stim) is a granulocyte colony-stimulating factor that stimulates the growth of neutrophils, a type of white blood cell important in the body's fight against infection. It is used to reduce the incidence of fever and infection in patients with certain types of cancer who are receiving chemotherapy that affects the bone marrow. COMMON BRAND NAME(S): Granix What should I tell my health care provider before I take this medicine? They need to know if you have any of these conditions: -ongoing radiation therapy -sickle cell anemia -an unusual or allergic reaction to tbo-filgrastim, filgrastim, pegfilgrastim, other medicines, foods, dyes, or preservatives -pregnant or trying to get pregnant -breast-feeding How should I use this medicine? This medicine is for injection under the skin. If you get this medicine at home, you will be taught how to prepare and give this medicine. Refer to the Instructions for Use that come with your medication packaging. Use exactly as directed. Take your medicine at regular intervals. Do not take your medicine more often than directed. It is important that you put your used needles and syringes in a special sharps container. Do not put them in a trash can. If you do not have a sharps container, call your pharmacist or healthcare provider to get one. Talk to your pediatrician regarding the use of this medicine in children. Special care may be needed. What if I miss a dose? It is important not to miss your dose. Call your doctor or health care professional if you miss a dose. What may interact with this medicine? This medicine may interact with the following medications: -medicines that may cause a release of neutrophils, such as lithium What should I watch for while using this medicine? You may need blood work done while you are taking this medicine. What side effects may I notice from receiving  this medicine? Side effects that you should report to your doctor or health care professional as soon as possible: -allergic reactions like skin rash, itching or hives, swelling of the face, lips, or tongue -shortness of breath or breathing problems -fever -pain, redness, or irritation at site where injected -pinpoint red spots on the skin -stomach or side pain, or pain at the shoulder -swelling -tiredness -trouble passing urine Side effects that usually do not require medical attention (report to your doctor or health care professional if they continue or are bothersome): -bone pain -muscle pain Where should I keep my medicine? Keep out of the reach of children. Store in a refrigerator between 2 and 8 degrees C (36 and 46 degrees F). Keep in carton to protect from light. Throw away this medicine if it is left out of the refrigerator for more than 5 consecutive days. Throw away any unused medicine after the expiration date.  2017 Elsevier/Gold Standard (2015-08-02 12:15:25)  

## 2016-08-07 ENCOUNTER — Ambulatory Visit: Payer: 59

## 2016-08-07 LAB — MULTIPLE MYELOMA PANEL, SERUM
ALBUMIN/GLOB SERPL: 1.4 (ref 0.7–1.7)
ALPHA2 GLOB SERPL ELPH-MCNC: 0.7 g/dL (ref 0.4–1.0)
Albumin SerPl Elph-Mcnc: 3.4 g/dL (ref 2.9–4.4)
Alpha 1: 0.3 g/dL (ref 0.0–0.4)
B-Globulin SerPl Elph-Mcnc: 0.9 g/dL (ref 0.7–1.3)
Gamma Glob SerPl Elph-Mcnc: 0.7 g/dL (ref 0.4–1.8)
Globulin, Total: 2.6 g/dL (ref 2.2–3.9)
IGM (IMMUNOGLOBIN M), SRM: 15 mg/dL — AB (ref 20–172)
IgA, Qn, Serum: 36 mg/dL — ABNORMAL LOW (ref 90–386)
IgG, Qn, Serum: 677 mg/dL — ABNORMAL LOW (ref 700–1600)
M Protein SerPl Elph-Mcnc: 0.5 g/dL — ABNORMAL HIGH
TOTAL PROTEIN: 6 g/dL (ref 6.0–8.5)

## 2016-08-08 ENCOUNTER — Telehealth: Payer: Self-pay | Admitting: *Deleted

## 2016-08-08 ENCOUNTER — Other Ambulatory Visit (HOSPITAL_BASED_OUTPATIENT_CLINIC_OR_DEPARTMENT_OTHER): Payer: 59

## 2016-08-08 DIAGNOSIS — C9000 Multiple myeloma not having achieved remission: Secondary | ICD-10-CM

## 2016-08-08 LAB — CMP (CANCER CENTER ONLY)
ALBUMIN: 3.7 g/dL (ref 3.3–5.5)
ALT(SGPT): 27 U/L (ref 10–47)
AST: 19 U/L (ref 11–38)
Alkaline Phosphatase: 69 U/L (ref 26–84)
BUN, Bld: 13 mg/dL (ref 7–22)
CALCIUM: 9.1 mg/dL (ref 8.0–10.3)
CHLORIDE: 103 meq/L (ref 98–108)
CO2: 27 meq/L (ref 18–33)
Creat: 1.1 mg/dl (ref 0.6–1.2)
Glucose, Bld: 166 mg/dL — ABNORMAL HIGH (ref 73–118)
Potassium: 4 mEq/L (ref 3.3–4.7)
SODIUM: 138 meq/L (ref 128–145)
Total Bilirubin: 0.6 mg/dl (ref 0.20–1.60)
Total Protein: 6.7 g/dL (ref 6.4–8.1)

## 2016-08-08 LAB — CBC WITH DIFFERENTIAL (CANCER CENTER ONLY)
BASO#: 0 10*3/uL (ref 0.0–0.2)
BASO%: 1.5 % (ref 0.0–2.0)
EOS ABS: 0.1 10*3/uL (ref 0.0–0.5)
EOS%: 13.4 % — ABNORMAL HIGH (ref 0.0–7.0)
HEMATOCRIT: 30.6 % — AB (ref 38.7–49.9)
HEMOGLOBIN: 10.1 g/dL — AB (ref 13.0–17.1)
LYMPH#: 0.2 10*3/uL — ABNORMAL LOW (ref 0.9–3.3)
LYMPH%: 26.9 % (ref 14.0–48.0)
MCH: 29.3 pg (ref 28.0–33.4)
MCHC: 33 g/dL (ref 32.0–35.9)
MCV: 89 fL (ref 82–98)
MONO#: 0.1 10*3/uL (ref 0.1–0.9)
MONO%: 9 % (ref 0.0–13.0)
NEUT%: 49.2 % (ref 40.0–80.0)
NEUTROS ABS: 0.3 10*3/uL — AB (ref 1.5–6.5)
Platelets: 53 10*3/uL — ABNORMAL LOW (ref 145–400)
RBC: 3.45 10*6/uL — ABNORMAL LOW (ref 4.20–5.70)
RDW: 13.5 % (ref 11.1–15.7)
WBC: 0.7 10*3/uL — CL (ref 4.0–10.0)

## 2016-08-08 NOTE — Telephone Encounter (Signed)
Critical Value WBC 0.7 Neut 0.3 [name] notified. No orders at this time

## 2016-08-11 ENCOUNTER — Other Ambulatory Visit: Payer: 59

## 2016-08-11 ENCOUNTER — Ambulatory Visit: Payer: 59

## 2016-08-11 DIAGNOSIS — C9001 Multiple myeloma in remission: Secondary | ICD-10-CM | POA: Diagnosis not present

## 2016-08-12 DIAGNOSIS — C9001 Multiple myeloma in remission: Secondary | ICD-10-CM | POA: Diagnosis not present

## 2016-08-13 ENCOUNTER — Other Ambulatory Visit: Payer: 59

## 2016-08-13 ENCOUNTER — Encounter: Payer: Self-pay | Admitting: Oncology

## 2016-08-13 DIAGNOSIS — C9001 Multiple myeloma in remission: Secondary | ICD-10-CM | POA: Diagnosis not present

## 2016-08-14 ENCOUNTER — Ambulatory Visit
Admission: RE | Admit: 2016-08-14 | Discharge: 2016-08-14 | Disposition: A | Discharge status: Home / Self Care | Payer: 59 | Source: Ambulatory Visit | Attending: Radiation Oncology | Admitting: Radiation Oncology

## 2016-08-14 DIAGNOSIS — C9001 Multiple myeloma in remission: Secondary | ICD-10-CM | POA: Diagnosis not present

## 2016-08-14 DIAGNOSIS — Q76413 Congenital kyphosis, cervicothoracic region: Secondary | ICD-10-CM | POA: Diagnosis not present

## 2016-08-14 DIAGNOSIS — G8929 Other chronic pain: Secondary | ICD-10-CM | POA: Diagnosis not present

## 2016-08-14 DIAGNOSIS — Z029 Encounter for administrative examinations, unspecified: Secondary | ICD-10-CM | POA: Diagnosis not present

## 2016-08-14 DIAGNOSIS — M545 Low back pain: Secondary | ICD-10-CM | POA: Diagnosis not present

## 2016-08-15 ENCOUNTER — Other Ambulatory Visit: Payer: 59

## 2016-08-15 DIAGNOSIS — C9001 Multiple myeloma in remission: Secondary | ICD-10-CM | POA: Diagnosis not present

## 2016-08-17 DIAGNOSIS — C9001 Multiple myeloma in remission: Secondary | ICD-10-CM | POA: Diagnosis not present

## 2016-08-18 ENCOUNTER — Other Ambulatory Visit: Payer: Self-pay | Admitting: *Deleted

## 2016-08-18 ENCOUNTER — Other Ambulatory Visit: Payer: 59

## 2016-08-18 DIAGNOSIS — M545 Low back pain, unspecified: Secondary | ICD-10-CM

## 2016-08-18 DIAGNOSIS — D472 Monoclonal gammopathy: Secondary | ICD-10-CM

## 2016-08-18 DIAGNOSIS — C9001 Multiple myeloma in remission: Secondary | ICD-10-CM | POA: Diagnosis not present

## 2016-08-18 DIAGNOSIS — G8929 Other chronic pain: Secondary | ICD-10-CM

## 2016-08-18 DIAGNOSIS — C9000 Multiple myeloma not having achieved remission: Secondary | ICD-10-CM

## 2016-08-18 MED ORDER — FENTANYL 50 MCG/HR TD PT72: 50.0000 ug | MEDICATED_PATCH | TRANSDERMAL | 0 refills | Status: DC

## 2016-08-19 DIAGNOSIS — C9001 Multiple myeloma in remission: Secondary | ICD-10-CM | POA: Diagnosis not present

## 2016-08-19 DIAGNOSIS — Q76413 Congenital kyphosis, cervicothoracic region: Secondary | ICD-10-CM | POA: Diagnosis not present

## 2016-08-20 ENCOUNTER — Other Ambulatory Visit: Payer: 59

## 2016-08-20 DIAGNOSIS — Z9481 Bone marrow transplant status: Secondary | ICD-10-CM | POA: Diagnosis not present

## 2016-08-20 DIAGNOSIS — I82432 Acute embolism and thrombosis of left popliteal vein: Secondary | ICD-10-CM | POA: Diagnosis not present

## 2016-08-20 DIAGNOSIS — C9001 Multiple myeloma in remission: Secondary | ICD-10-CM | POA: Diagnosis not present

## 2016-08-20 DIAGNOSIS — K449 Diaphragmatic hernia without obstruction or gangrene: Secondary | ICD-10-CM | POA: Diagnosis not present

## 2016-08-20 DIAGNOSIS — I82412 Acute embolism and thrombosis of left femoral vein: Secondary | ICD-10-CM | POA: Diagnosis not present

## 2016-08-21 DIAGNOSIS — Z9481 Bone marrow transplant status: Secondary | ICD-10-CM | POA: Diagnosis not present

## 2016-08-21 DIAGNOSIS — C9000 Multiple myeloma not having achieved remission: Secondary | ICD-10-CM | POA: Diagnosis not present

## 2016-08-21 DIAGNOSIS — C9001 Multiple myeloma in remission: Secondary | ICD-10-CM | POA: Diagnosis not present

## 2016-08-21 DIAGNOSIS — I82412 Acute embolism and thrombosis of left femoral vein: Secondary | ICD-10-CM | POA: Diagnosis not present

## 2016-08-21 DIAGNOSIS — Z5111 Encounter for antineoplastic chemotherapy: Secondary | ICD-10-CM | POA: Diagnosis not present

## 2016-08-21 DIAGNOSIS — Z713 Dietary counseling and surveillance: Secondary | ICD-10-CM | POA: Diagnosis not present

## 2016-08-22 ENCOUNTER — Other Ambulatory Visit: Payer: 59

## 2016-08-22 DIAGNOSIS — K449 Diaphragmatic hernia without obstruction or gangrene: Secondary | ICD-10-CM | POA: Diagnosis not present

## 2016-08-22 DIAGNOSIS — I82412 Acute embolism and thrombosis of left femoral vein: Secondary | ICD-10-CM | POA: Diagnosis not present

## 2016-08-22 DIAGNOSIS — Z9481 Bone marrow transplant status: Secondary | ICD-10-CM | POA: Diagnosis not present

## 2016-08-22 DIAGNOSIS — M549 Dorsalgia, unspecified: Secondary | ICD-10-CM | POA: Diagnosis not present

## 2016-08-22 DIAGNOSIS — C9001 Multiple myeloma in remission: Secondary | ICD-10-CM | POA: Diagnosis not present

## 2016-08-23 DIAGNOSIS — Z9481 Bone marrow transplant status: Secondary | ICD-10-CM | POA: Diagnosis not present

## 2016-08-23 DIAGNOSIS — I82412 Acute embolism and thrombosis of left femoral vein: Secondary | ICD-10-CM | POA: Diagnosis not present

## 2016-08-23 DIAGNOSIS — C9001 Multiple myeloma in remission: Secondary | ICD-10-CM | POA: Diagnosis not present

## 2016-08-24 DIAGNOSIS — C9001 Multiple myeloma in remission: Secondary | ICD-10-CM | POA: Diagnosis not present

## 2016-08-24 DIAGNOSIS — I82412 Acute embolism and thrombosis of left femoral vein: Secondary | ICD-10-CM | POA: Diagnosis not present

## 2016-08-24 DIAGNOSIS — Z9481 Bone marrow transplant status: Secondary | ICD-10-CM | POA: Diagnosis not present

## 2016-08-25 ENCOUNTER — Other Ambulatory Visit: Payer: 59

## 2016-08-25 DIAGNOSIS — I82412 Acute embolism and thrombosis of left femoral vein: Secondary | ICD-10-CM | POA: Diagnosis not present

## 2016-08-25 DIAGNOSIS — C9001 Multiple myeloma in remission: Secondary | ICD-10-CM | POA: Diagnosis not present

## 2016-08-25 DIAGNOSIS — Z9481 Bone marrow transplant status: Secondary | ICD-10-CM | POA: Diagnosis not present

## 2016-08-26 DIAGNOSIS — I82412 Acute embolism and thrombosis of left femoral vein: Secondary | ICD-10-CM | POA: Diagnosis not present

## 2016-08-26 DIAGNOSIS — C9001 Multiple myeloma in remission: Secondary | ICD-10-CM | POA: Diagnosis not present

## 2016-08-26 DIAGNOSIS — Z9481 Bone marrow transplant status: Secondary | ICD-10-CM | POA: Diagnosis not present

## 2016-08-27 ENCOUNTER — Other Ambulatory Visit: Payer: 59

## 2016-08-27 DIAGNOSIS — I82412 Acute embolism and thrombosis of left femoral vein: Secondary | ICD-10-CM | POA: Diagnosis not present

## 2016-08-27 DIAGNOSIS — Z9481 Bone marrow transplant status: Secondary | ICD-10-CM | POA: Diagnosis not present

## 2016-08-27 DIAGNOSIS — K449 Diaphragmatic hernia without obstruction or gangrene: Secondary | ICD-10-CM | POA: Diagnosis not present

## 2016-08-27 DIAGNOSIS — C9000 Multiple myeloma not having achieved remission: Secondary | ICD-10-CM | POA: Diagnosis not present

## 2016-08-27 DIAGNOSIS — I82401 Acute embolism and thrombosis of unspecified deep veins of right lower extremity: Secondary | ICD-10-CM | POA: Diagnosis not present

## 2016-08-27 DIAGNOSIS — C9001 Multiple myeloma in remission: Secondary | ICD-10-CM | POA: Diagnosis not present

## 2016-08-28 DIAGNOSIS — I82412 Acute embolism and thrombosis of left femoral vein: Secondary | ICD-10-CM | POA: Diagnosis not present

## 2016-08-28 DIAGNOSIS — Z9481 Bone marrow transplant status: Secondary | ICD-10-CM | POA: Diagnosis not present

## 2016-08-28 DIAGNOSIS — I82401 Acute embolism and thrombosis of unspecified deep veins of right lower extremity: Secondary | ICD-10-CM | POA: Diagnosis not present

## 2016-08-28 DIAGNOSIS — C9001 Multiple myeloma in remission: Secondary | ICD-10-CM | POA: Diagnosis not present

## 2016-08-29 ENCOUNTER — Other Ambulatory Visit: Payer: 59

## 2016-08-29 DIAGNOSIS — Z9481 Bone marrow transplant status: Secondary | ICD-10-CM | POA: Diagnosis not present

## 2016-08-29 DIAGNOSIS — C9001 Multiple myeloma in remission: Secondary | ICD-10-CM | POA: Diagnosis not present

## 2016-08-29 DIAGNOSIS — I82412 Acute embolism and thrombosis of left femoral vein: Secondary | ICD-10-CM | POA: Diagnosis not present

## 2016-08-29 DIAGNOSIS — N200 Calculus of kidney: Secondary | ICD-10-CM | POA: Diagnosis not present

## 2016-08-29 DIAGNOSIS — C9000 Multiple myeloma not having achieved remission: Secondary | ICD-10-CM | POA: Diagnosis not present

## 2016-08-29 DIAGNOSIS — I82413 Acute embolism and thrombosis of femoral vein, bilateral: Secondary | ICD-10-CM | POA: Diagnosis not present

## 2016-08-30 DIAGNOSIS — Z9484 Stem cells transplant status: Secondary | ICD-10-CM | POA: Diagnosis not present

## 2016-08-30 DIAGNOSIS — C9001 Multiple myeloma in remission: Secondary | ICD-10-CM | POA: Diagnosis not present

## 2016-08-30 DIAGNOSIS — Z48298 Encounter for aftercare following other organ transplant: Secondary | ICD-10-CM | POA: Diagnosis not present

## 2016-08-30 DIAGNOSIS — I82412 Acute embolism and thrombosis of left femoral vein: Secondary | ICD-10-CM | POA: Diagnosis not present

## 2016-08-30 DIAGNOSIS — Z9481 Bone marrow transplant status: Secondary | ICD-10-CM | POA: Diagnosis not present

## 2016-08-30 DIAGNOSIS — C9000 Multiple myeloma not having achieved remission: Secondary | ICD-10-CM | POA: Diagnosis not present

## 2016-08-31 DIAGNOSIS — I82412 Acute embolism and thrombosis of left femoral vein: Secondary | ICD-10-CM | POA: Diagnosis not present

## 2016-08-31 DIAGNOSIS — Z9481 Bone marrow transplant status: Secondary | ICD-10-CM | POA: Diagnosis not present

## 2016-08-31 DIAGNOSIS — C9001 Multiple myeloma in remission: Secondary | ICD-10-CM | POA: Diagnosis not present

## 2016-09-01 ENCOUNTER — Other Ambulatory Visit: Payer: 59

## 2016-09-01 DIAGNOSIS — Z9481 Bone marrow transplant status: Secondary | ICD-10-CM | POA: Diagnosis not present

## 2016-09-01 DIAGNOSIS — C9001 Multiple myeloma in remission: Secondary | ICD-10-CM | POA: Diagnosis not present

## 2016-09-01 DIAGNOSIS — M542 Cervicalgia: Secondary | ICD-10-CM | POA: Diagnosis not present

## 2016-09-02 DIAGNOSIS — I82432 Acute embolism and thrombosis of left popliteal vein: Secondary | ICD-10-CM | POA: Diagnosis not present

## 2016-09-02 DIAGNOSIS — M5441 Lumbago with sciatica, right side: Secondary | ICD-10-CM | POA: Diagnosis not present

## 2016-09-02 DIAGNOSIS — Z9481 Bone marrow transplant status: Secondary | ICD-10-CM | POA: Diagnosis not present

## 2016-09-02 DIAGNOSIS — C9000 Multiple myeloma not having achieved remission: Secondary | ICD-10-CM | POA: Diagnosis not present

## 2016-09-02 DIAGNOSIS — C9001 Multiple myeloma in remission: Secondary | ICD-10-CM | POA: Diagnosis not present

## 2016-09-02 DIAGNOSIS — I82412 Acute embolism and thrombosis of left femoral vein: Secondary | ICD-10-CM | POA: Diagnosis not present

## 2016-09-03 ENCOUNTER — Other Ambulatory Visit: Payer: 59

## 2016-09-03 DIAGNOSIS — Z9481 Bone marrow transplant status: Secondary | ICD-10-CM | POA: Diagnosis not present

## 2016-09-03 DIAGNOSIS — C9001 Multiple myeloma in remission: Secondary | ICD-10-CM | POA: Diagnosis not present

## 2016-09-03 DIAGNOSIS — I82412 Acute embolism and thrombosis of left femoral vein: Secondary | ICD-10-CM | POA: Diagnosis not present

## 2016-09-03 DIAGNOSIS — C9000 Multiple myeloma not having achieved remission: Secondary | ICD-10-CM | POA: Diagnosis not present

## 2016-09-04 DIAGNOSIS — I82412 Acute embolism and thrombosis of left femoral vein: Secondary | ICD-10-CM | POA: Diagnosis not present

## 2016-09-04 DIAGNOSIS — C9001 Multiple myeloma in remission: Secondary | ICD-10-CM | POA: Diagnosis not present

## 2016-09-04 DIAGNOSIS — C9000 Multiple myeloma not having achieved remission: Secondary | ICD-10-CM | POA: Diagnosis not present

## 2016-09-04 DIAGNOSIS — I82401 Acute embolism and thrombosis of unspecified deep veins of right lower extremity: Secondary | ICD-10-CM | POA: Diagnosis not present

## 2016-09-04 DIAGNOSIS — Z9481 Bone marrow transplant status: Secondary | ICD-10-CM | POA: Diagnosis not present

## 2016-09-05 ENCOUNTER — Other Ambulatory Visit: Payer: 59

## 2016-09-05 DIAGNOSIS — Z9481 Bone marrow transplant status: Secondary | ICD-10-CM | POA: Diagnosis not present

## 2016-09-05 DIAGNOSIS — I82412 Acute embolism and thrombosis of left femoral vein: Secondary | ICD-10-CM | POA: Diagnosis not present

## 2016-09-05 DIAGNOSIS — C9001 Multiple myeloma in remission: Secondary | ICD-10-CM | POA: Diagnosis not present

## 2016-09-06 DIAGNOSIS — I82412 Acute embolism and thrombosis of left femoral vein: Secondary | ICD-10-CM | POA: Diagnosis not present

## 2016-09-06 DIAGNOSIS — C9001 Multiple myeloma in remission: Secondary | ICD-10-CM | POA: Diagnosis not present

## 2016-09-06 DIAGNOSIS — Z9481 Bone marrow transplant status: Secondary | ICD-10-CM | POA: Diagnosis not present

## 2016-09-07 DIAGNOSIS — Z9481 Bone marrow transplant status: Secondary | ICD-10-CM | POA: Diagnosis not present

## 2016-09-07 DIAGNOSIS — I82412 Acute embolism and thrombosis of left femoral vein: Secondary | ICD-10-CM | POA: Diagnosis not present

## 2016-09-07 DIAGNOSIS — C9001 Multiple myeloma in remission: Secondary | ICD-10-CM | POA: Diagnosis not present

## 2016-09-08 ENCOUNTER — Other Ambulatory Visit: Payer: 59

## 2016-09-08 DIAGNOSIS — C9000 Multiple myeloma not having achieved remission: Secondary | ICD-10-CM | POA: Diagnosis not present

## 2016-09-08 DIAGNOSIS — C9001 Multiple myeloma in remission: Secondary | ICD-10-CM | POA: Diagnosis not present

## 2016-09-08 DIAGNOSIS — M7989 Other specified soft tissue disorders: Secondary | ICD-10-CM | POA: Diagnosis not present

## 2016-09-08 DIAGNOSIS — M79672 Pain in left foot: Secondary | ICD-10-CM | POA: Diagnosis not present

## 2016-09-08 DIAGNOSIS — Z9481 Bone marrow transplant status: Secondary | ICD-10-CM | POA: Diagnosis not present

## 2016-09-09 DIAGNOSIS — Z48298 Encounter for aftercare following other organ transplant: Secondary | ICD-10-CM | POA: Diagnosis not present

## 2016-09-09 DIAGNOSIS — Z9481 Bone marrow transplant status: Secondary | ICD-10-CM | POA: Diagnosis not present

## 2016-09-09 DIAGNOSIS — I82412 Acute embolism and thrombosis of left femoral vein: Secondary | ICD-10-CM | POA: Diagnosis not present

## 2016-09-09 DIAGNOSIS — Z9484 Stem cells transplant status: Secondary | ICD-10-CM | POA: Diagnosis not present

## 2016-09-09 DIAGNOSIS — C9001 Multiple myeloma in remission: Secondary | ICD-10-CM | POA: Diagnosis not present

## 2016-09-10 ENCOUNTER — Other Ambulatory Visit: Payer: 59

## 2016-09-12 ENCOUNTER — Other Ambulatory Visit: Payer: Self-pay | Admitting: *Deleted

## 2016-09-12 ENCOUNTER — Other Ambulatory Visit (HOSPITAL_BASED_OUTPATIENT_CLINIC_OR_DEPARTMENT_OTHER): Payer: 59

## 2016-09-12 DIAGNOSIS — C9000 Multiple myeloma not having achieved remission: Secondary | ICD-10-CM | POA: Diagnosis not present

## 2016-09-12 LAB — CBC WITH DIFFERENTIAL (CANCER CENTER ONLY)
BASO#: 0 10*3/uL (ref 0.0–0.2)
BASO%: 0.3 % (ref 0.0–2.0)
EOS%: 0 % (ref 0.0–7.0)
Eosinophils Absolute: 0 10*3/uL (ref 0.0–0.5)
HEMATOCRIT: 37.3 % — AB (ref 38.7–49.9)
HGB: 12.7 g/dL — ABNORMAL LOW (ref 13.0–17.1)
LYMPH#: 0.9 10*3/uL (ref 0.9–3.3)
LYMPH%: 24.7 % (ref 14.0–48.0)
MCH: 30.3 pg (ref 28.0–33.4)
MCHC: 34 g/dL (ref 32.0–35.9)
MCV: 89 fL (ref 82–98)
MONO#: 1.1 10*3/uL — AB (ref 0.1–0.9)
MONO%: 29.1 % — ABNORMAL HIGH (ref 0.0–13.0)
NEUT#: 1.7 10*3/uL (ref 1.5–6.5)
NEUT%: 45.9 % (ref 40.0–80.0)
PLATELETS: 113 10*3/uL — AB (ref 145–400)
RBC: 4.19 10*6/uL — ABNORMAL LOW (ref 4.20–5.70)
RDW: 18.3 % — AB (ref 11.1–15.7)
WBC: 3.7 10*3/uL — ABNORMAL LOW (ref 4.0–10.0)

## 2016-09-12 LAB — COMPREHENSIVE METABOLIC PANEL
ALT: 27 U/L (ref 0–55)
ANION GAP: 10 meq/L (ref 3–11)
AST: 28 U/L (ref 5–34)
Albumin: 3.8 g/dL (ref 3.5–5.0)
Alkaline Phosphatase: 92 U/L (ref 40–150)
BUN: 11.9 mg/dL (ref 7.0–26.0)
CALCIUM: 9.9 mg/dL (ref 8.4–10.4)
CHLORIDE: 106 meq/L (ref 98–109)
CO2: 23 meq/L (ref 22–29)
CREATININE: 0.8 mg/dL (ref 0.7–1.3)
Glucose: 131 mg/dl (ref 70–140)
POTASSIUM: 4 meq/L (ref 3.5–5.1)
Sodium: 139 mEq/L (ref 136–145)
Total Bilirubin: 0.41 mg/dL (ref 0.20–1.20)
Total Protein: 6.7 g/dL (ref 6.4–8.3)

## 2016-09-12 MED ORDER — ONDANSETRON 8 MG PO TBDP: 8.0000 mg | ORAL_TABLET | Freq: Three times a day (TID) | ORAL | 1 refills | Status: DC | PRN

## 2016-09-15 ENCOUNTER — Other Ambulatory Visit (HOSPITAL_BASED_OUTPATIENT_CLINIC_OR_DEPARTMENT_OTHER): Payer: 59

## 2016-09-15 DIAGNOSIS — C9000 Multiple myeloma not having achieved remission: Secondary | ICD-10-CM

## 2016-09-15 DIAGNOSIS — M545 Low back pain: Secondary | ICD-10-CM | POA: Diagnosis not present

## 2016-09-15 DIAGNOSIS — D472 Monoclonal gammopathy: Secondary | ICD-10-CM

## 2016-09-15 DIAGNOSIS — G8929 Other chronic pain: Secondary | ICD-10-CM

## 2016-09-15 LAB — LACTATE DEHYDROGENASE: LDH: 209 U/L (ref 125–245)

## 2016-09-15 LAB — CMP (CANCER CENTER ONLY)
ALBUMIN: 3.8 g/dL (ref 3.3–5.5)
ALT(SGPT): 33 U/L (ref 10–47)
AST: 34 U/L (ref 11–38)
Alkaline Phosphatase: 81 U/L (ref 26–84)
BILIRUBIN TOTAL: 0.6 mg/dL (ref 0.20–1.60)
BUN, Bld: 10 mg/dL (ref 7–22)
CALCIUM: 9.4 mg/dL (ref 8.0–10.3)
CO2: 24 mEq/L (ref 18–33)
Chloride: 103 mEq/L (ref 98–108)
Creat: 0.7 mg/dl (ref 0.6–1.2)
GLUCOSE: 108 mg/dL (ref 73–118)
Potassium: 3.7 mEq/L (ref 3.3–4.7)
Sodium: 140 mEq/L (ref 128–145)
Total Protein: 6.6 g/dL (ref 6.4–8.1)

## 2016-09-15 LAB — CBC WITH DIFFERENTIAL (CANCER CENTER ONLY)
BASO#: 0 10*3/uL (ref 0.0–0.2)
BASO%: 0 % (ref 0.0–2.0)
EOS ABS: 0 10*3/uL (ref 0.0–0.5)
EOS%: 0 % (ref 0.0–7.0)
HEMATOCRIT: 39.1 % (ref 38.7–49.9)
HGB: 13.1 g/dL (ref 13.0–17.1)
LYMPH#: 1 10*3/uL (ref 0.9–3.3)
LYMPH%: 29.4 % (ref 14.0–48.0)
MCH: 30.2 pg (ref 28.0–33.4)
MCHC: 33.5 g/dL (ref 32.0–35.9)
MCV: 90 fL (ref 82–98)
MONO#: 1.1 10*3/uL — ABNORMAL HIGH (ref 0.1–0.9)
MONO%: 33 % — ABNORMAL HIGH (ref 0.0–13.0)
NEUT#: 1.2 10*3/uL — ABNORMAL LOW (ref 1.5–6.5)
NEUT%: 37.6 % — AB (ref 40.0–80.0)
Platelets: 174 10*3/uL (ref 145–400)
RBC: 4.34 10*6/uL (ref 4.20–5.70)
RDW: 18.4 % — AB (ref 11.1–15.7)
WBC: 3.3 10*3/uL — ABNORMAL LOW (ref 4.0–10.0)

## 2016-09-15 LAB — TECHNOLOGIST REVIEW CHCC SATELLITE

## 2016-09-16 ENCOUNTER — Ambulatory Visit: Payer: 59 | Attending: Internal Medicine | Admitting: Physical Therapy

## 2016-09-16 DIAGNOSIS — M6281 Muscle weakness (generalized): Secondary | ICD-10-CM

## 2016-09-16 DIAGNOSIS — M546 Pain in thoracic spine: Secondary | ICD-10-CM | POA: Diagnosis not present

## 2016-09-16 DIAGNOSIS — R293 Abnormal posture: Secondary | ICD-10-CM | POA: Diagnosis not present

## 2016-09-16 LAB — KAPPA/LAMBDA LIGHT CHAINS
IG LAMBDA FREE LIGHT CHAIN: 2.1 mg/L — AB (ref 5.7–26.3)
Ig Kappa Free Light Chain: 4.8 mg/L (ref 3.3–19.4)
Kappa/Lambda FluidC Ratio: 2.29 — ABNORMAL HIGH (ref 0.26–1.65)

## 2016-09-16 NOTE — Patient Instructions (Signed)

## 2016-09-16 NOTE — Therapy (Signed)
[place], [place], [address] Phone: [phone]   Fax:  [phone]  Physical Therapy Evaluation  Patient Details  Name: Jorge [name] MRN: [id number] Date of Birth: 05-Apr-1956 Referring Provider: Lovena Neighbours   Encounter Date: 09/16/2016      PT End of Session - 09/16/16 1647    Visit Number 1   Number of Visits 9   Date for PT Re-Evaluation 10/14/16   PT Start Time 1430   PT Stop Time 1515   PT Time Calculation (min) 45 min   Activity Tolerance Patient limited by pain   Behavior During Therapy [facility] for tasks assessed/performed      Past Medical History:  Diagnosis Date  . Anxiety   . Bone metastasis ([place])   . Chest cold 05/19/2016   productive cough  -- started on antibiotic  . Chronic back pain    due to bone mets from myeloma  . Cough   . Depression   . GERD (gastroesophageal reflux disease)   . Hiatal hernia   . History of chicken pox   . History of concussion    age 61 -- no residual  . History of DVT of lower extremity 03/21/2016  treated and completed w/ xarelto   per doppler left extensive occlusion common femoral, femoral, and popliteal veins and right partial occlusion common femoral and profunda femoral veins/  last doppler 06-04-2016 no evidence acute or chronic dvt noted either leg   . History of radiation therapy 06/09/16-06/23/16   lower thoracic spine 25 Gy in 10 fractions  . Mouth ulcers    secondary to radiation  . Multiple myeloma ([place]) dx 02/22/2016 via bone marrow bx---  oncologist-  [name]   IgG Kappa-- Hyperdiploid/ +11 w/ bone mets--  current treatment chemotherapy (started 08/ 2017)and pallitive radiation to back started 06-09-2016  . Renal calculus, right   . Wears contact lenses     Past Surgical History:  Procedure Laterality Date  . COLONOSCOPY  [id number]  . CYSTOSCOPY W/ URETERAL STENT PLACEMENT Right 06/20/2016   Procedure: CYSTOSCOPY WITH STENT  REPLACEMENT;  Surgeon: Kathie Rhodes, MD;  Location: [facility];  Service: Urology;  Laterality: Right;  . CYSTOSCOPY WITH RETROGRADE PYELOGRAM, URETEROSCOPY AND STENT PLACEMENT Right 05/30/2016   Procedure: CYSTOSCOPY WITH RETROGRADE PYELOGRAM, URETEROSCOPY AND STENT PLACEMENT,DILITATION URETERAL STRICTURE;  Surgeon: Kathie Rhodes, MD;  Location: [facility];  Service: Urology;  Laterality: Right;  . CYSTOSCOPY/RETROGRADE/URETEROSCOPY/STONE EXTRACTION WITH BASKET Right 06/20/2016   Procedure: CYSTOSCOPY/URETEROSCOPY/STONE EXTRACTION WITH BASKET;  Surgeon: Kathie Rhodes, MD;  Location: [facility];  Service: Urology;  Laterality: Right;  . HOLMIUM LASER APPLICATION Right 38/03/3733   Procedure: HOLMIUM LASER APPLICATION;  Surgeon: Kathie Rhodes, MD;  Location: [facility];  Service: Urology;  Laterality: Right;  . IR GENERIC HISTORICAL  02/11/2016   IR RADIOLOGIST EVAL & MGMT 02/11/2016 [facility]  . IR GENERIC HISTORICAL  02/15/2016   IR BONE TUMOR(S)RF ABLATION 02/15/2016 Luanne Bras, MD [facility]  . IR GENERIC HISTORICAL  02/15/2016   IR BONE TUMOR(S)RF ABLATION 02/15/2016 Luanne Bras, MD [facility]  . IR GENERIC HISTORICAL  02/15/2016   IR BONE TUMOR(S)RF ABLATION 02/15/2016 Luanne Bras, MD [facility]  . IR GENERIC HISTORICAL  02/15/2016   IR KYPHO THORACIC WITH BONE BIOPSY 02/15/2016 Luanne Bras, MD [facility]  . IR GENERIC HISTORICAL  02/15/2016   IR KYPHO THORACIC WITH BONE BIOPSY 02/15/2016 Luanne Bras, MD [facility]  .  IR GENERIC HISTORICAL  02/15/2016   IR VERTEBROPLASTY CERV/THOR BX INC UNI/BIL INC/INJECT/IMAGING 02/15/2016 Luanne Bras, MD [facility]  . IR GENERIC HISTORICAL  03/13/2016   IR KYPHO EA ADDL LEVEL THORACIC OR LUMBAR 03/13/2016 Luanne Bras, MD [facility]  . IR GENERIC HISTORICAL  03/13/2016   IR KYPHO EA ADDL LEVEL THORACIC OR LUMBAR 03/13/2016 Luanne Bras, MD [facility]  . IR GENERIC  HISTORICAL  03/13/2016   IR BONE TUMOR(S)RF ABLATION 03/13/2016 Luanne Bras, MD [facility]  . IR GENERIC HISTORICAL  03/13/2016   IR KYPHO LUMBAR INC FX REDUCE BONE BX UNI/BIL CANNULATION INC/IMAGING 03/13/2016 Luanne Bras, MD [facility]  . IR GENERIC HISTORICAL  03/13/2016   IR BONE TUMOR(S)RF ABLATION 03/13/2016 Luanne Bras, MD [facility]  . IR GENERIC HISTORICAL  03/13/2016   IR BONE TUMOR(S)RF ABLATION 03/13/2016 Luanne Bras, MD [facility]  . IR GENERIC HISTORICAL  03/31/2016   IR RADIOLOGIST EVAL & MGMT 03/31/2016 [facility]  . LAPAROSCOPIC INGUINAL HERNIA REPAIR Bilateral 12-16-2013  dr gross  . RADIOLOGY WITH ANESTHESIA N/A 02/15/2016   Procedure: Spinal Ablation;  Surgeon: Luanne Bras, MD;  Location: [place];  Service: Radiology;  Laterality: N/A;  . RADIOLOGY WITH ANESTHESIA N/A 03/13/2016   Procedure: LUMBER ABLATION;  Surgeon: Luanne Bras, MD;  Location: [place];  Service: Radiology;  Laterality: N/A;  . ROTATOR CUFF REPAIR Right 2003  . TONSILLECTOMY  age 61  . WISDOM TOOTH EXTRACTION      There were no vitals filed for this visit.       Subjective Assessment - 09/16/16 1429    Subjective "My back is so sore all the time right now"  Pt states he walked a mile today with his back brace on   Patient is accompained by: Family member  wife Collie Siad   Pertinent History multipe myeloma with multiple vertebral compression fractures with kyphplasties and vertbuloplasties.  Pt had stem cell tranplant on Feb. 9, 2018 at [facility] and reports that his bloodwork levels are OK.  He has them checked by Dr. Marin Olp Frequently.  He is referred for "outpatient physical therapy for strength and conditioning. Due to extensive vertebral involvement, and compression fractures. avoid all flexion based activity and not lifting> 10 pounds"   How long can you sit comfortably? 15 min    How long can you stand comfortably? 10- 15 minutes without the brace    How long can you  walk comfortably? with brace walked a mile    Patient Stated Goals be able to strenthen  my back and reduce pain, increase walking and drive a care without pain    Currently in Pain? Yes   Pain Score 3    Pain Location Back   Pain Orientation Mid;Medial   Pain Descriptors / Indicators Aching   Pain Type Chronic pain   Pain Radiating Towards wraps around toward the front    Pain Onset More than a month ago   Pain Frequency Constant   Aggravating Factors  getting up and having to support myself, sitting at my computer, bending over even just a little it.    Pain Relieving Factors recliner    Effect of Pain on Daily Activities limited by the pain             [facility] PT Assessment - 09/16/16 0001      Assessment   Medical Diagnosis recent stem cell transplant; multiple vertebral compression fractures    Referring Provider Lovena Neighbours  Onset Date/Surgical Date 08/22/16   Hand Dominance Right   Next MD Visit Pt goes to see Dr. Marin Olp frequently for blood work    Prior Therapy acute PT while in [facility]      Precautions   Precautions Other (comment)   Precaution Comments avoid all spinal flexion, based activity,  no lifting over 10 pounds    Required Braces or Orthoses Spinal Brace   Spinal Brace Applied in standing position  3 point extension brace      Restrictions   Weight Bearing Restrictions No   Other Position/Activity Restrictions Pt unable to lie flat due to pain      Balance Screen   Has the patient fallen in the past 6 months No   Has the patient had a decrease in activity level because of a fear of falling?  Yes   Is the patient reluctant to leave their home because of a fear of falling?  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     Cognition   Overall Cognitive Status Within Functional Limits for tasks assessed   Behaviors Other (comment)  motivated for exercise      Observation/Other Assessments   Observations pt comes in with mask on , using a straight cane for occasional balance support,    Skin Integrity intact      Sensation   Light Touch Appears Intact     Coordination   Gross Motor Movements are Fluid and Coordinated Yes  pt visibly fatigued at end of session      Functional Tests   Functional tests Sit to Stand;Other     Sit to Stand   Comments 11 in 30 seconds from low mat using arms. c/o fatigue at end of trial      Other:   Other/ Comments able to stand 75 seconds with weight on both legs before he had to sit down due to back pain      Posture/Postural Control   Posture/Postural Control Postural limitations   Postural Limitations Rounded Shoulders;Forward head;Increased thoracic kyphosis;Posterior pelvic tilt   Posture Comments pt with tender to palpation at round midthoractic area      AROM   Overall AROM Comments Extremilty ROM appears WNL did not test spinal range of motin      Strength   Overall Strength Comments gneralized weakness with fatigue at end of session with c/o incrased back soreness    Right Hand Grip (lbs) 91/80/85   Left Hand Grip (lbs) 90/87/85     Transfers   Comments pt uses hands to push up from sitting to stand      Ambulation/Gait   Gait Comments pt uses a straight cane for occasional balance      Balance   Balance Assessed Yes   Balance comment Pt was able to stand for 10 seconds in each of the 4 postions : rhomberg, semi-tandem, tandem, and one legged stance  Timed Up and Go  Test   TUG Normal TUG   Normal TUG (seconds) 9.16                   [facility] Adult PT Treatment/Exercise - 09/16/16 0001      Lumbar Exercises: Standing   Other Standing Lumbar Exercises Began instruction in Meeks decomprssion exercises with pt doing activities in standing as he cannot tolerate supine at this point. Also began instruciton in postural extension awareness, core stablalization in standing.                 PT Education - 09/16/16 1646    Education provided Yes   Education Details postureal extension awareness and decompression exexrcise    Person(s) Educated Patient;Spouse   Methods Explanation;Demonstration   Comprehension Need further instruction          PT Short Term Goals - 05/08/15 1749      PT SHORT TERM GOAL #1   Title independent with initial HEP   Status Achieved           PT Long Term Goals - 07/12/15 1732      PT LONG TERM GOAL #2   Title increase Lumbar ROM to WFL's   Status Achieved           Long Term Clinic Goals - 09/16/16 1705      [facility] Long Term Goal  #1   Title Pt will report a decrease in pain so that he is able to sit at his computer and work 30 minutes    Time 4   Period Weeks   Status New     CC Long Term Goal  #2   Title Pt will increase number of repetitions of sit to stand to 14 in 30 seconds to demonstrate and increase in functional strength   Time 4   Period Weeks   Status New     CC Long Term Goal  #3   Title Pt will be independent in a home program of exercise to increase back extension strength and decrease pain.   Time 4   Period Weeks     CC Long Term Goal  #4   Title Pt will report he can drive his car short distances without pain to increase his functional independence    Time 4   Period Weeks   Status New            Plan - 09/16/16 1651    Clinical Impression Statement 61 yo male with mulitple myloma with extensive spinal involvement compression fractures who is recovering from  a recent stem cell transplant.  He wants to get stonger and reduce his back pain.  Because of muliple cormorbities and evolving nature of his pain, this is a moderate complexity eval.    Rehab Potential Good   Clinical Impairments Affecting Rehab Potential multiple spinal compression fractures, recovering blood parameters due to stem cell transplant    PT Frequency 2x / week   PT Duration 4 weeks   PT Treatment/Interventions ADLs/Self Care Home Management;Patient/family education;Energy conservation;Gait training;Functional mobility training;Therapeutic activities;Therapeutic exercise;Balance training;Neuromuscular re-education;Moist Heat;Electrical Stimulation;Cryotherapy;Stair training   PT Next Visit Plan test 6 minute walk test with O2 sats, try supported supine position for meeks decompression exercise, standing UE and LE extension exercise and teach these for HEP   Consulted and Agree with Plan of Care Patient;Family member/caregiver   Family Member Consulted wife Collie Siad       Patient will benefit  from skilled therapeutic intervention in order to improve the following deficits and impairments:  Decreased endurance, Decreased knowledge of precautions, Decreased activity tolerance, Decreased strength, Pain, Decreased mobility, Postural dysfunction, Impaired perceived functional ability  Visit Diagnosis: Abnormal posture  Muscle weakness (generalized)  Pain in thoracic spine     Problem List Patient Active Problem List   Diagnosis Date Noted  . Phlebitis or thrombophlebitis of lower extremity ([place]) 07/21/2016  . Visit for preventive health examination 05/16/2016  . Aphthous ulcer 05/16/2016  . Ureterolithiasis 05/16/2016  . Acute deep vein thrombosis (DVT) of femoral vein of left lower extremity ([place]) 03/21/2016  . Multiple myeloma ([place]) 02/22/2016  . Multiple myeloma not having achieved remission ([place]) 02/22/2016  . LBP (low back pain) 11/02/2013  . Bilateral inguinal hernia  ([facility]), s/p lap repair 12/16/2013 11/02/2013   Donato Heinz. Owens Shark PT  Norwood Levo 09/16/2016, 5:14 PM  [place], [place], [address] Phone: [phone]   Fax:  [phone]  Name: Jorge [name] MRN: [id number] Date of Birth: 10-30-1955

## 2016-09-17 ENCOUNTER — Other Ambulatory Visit (HOSPITAL_BASED_OUTPATIENT_CLINIC_OR_DEPARTMENT_OTHER): Payer: 59

## 2016-09-17 ENCOUNTER — Other Ambulatory Visit: Payer: 59

## 2016-09-17 DIAGNOSIS — C9000 Multiple myeloma not having achieved remission: Secondary | ICD-10-CM

## 2016-09-17 LAB — COMPREHENSIVE METABOLIC PANEL
ALT: 25 U/L (ref 0–55)
AST: 26 U/L (ref 5–34)
Albumin: 3.9 g/dL (ref 3.5–5.0)
Alkaline Phosphatase: 84 U/L (ref 40–150)
Anion Gap: 9 mEq/L (ref 3–11)
BUN: 11.7 mg/dL (ref 7.0–26.0)
CO2: 25 meq/L (ref 22–29)
Calcium: 9.5 mg/dL (ref 8.4–10.4)
Chloride: 106 mEq/L (ref 98–109)
Creatinine: 0.8 mg/dL (ref 0.7–1.3)
GLUCOSE: 126 mg/dL (ref 70–140)
Potassium: 4.3 mEq/L (ref 3.5–5.1)
SODIUM: 139 meq/L (ref 136–145)
Total Bilirubin: 0.35 mg/dL (ref 0.20–1.20)
Total Protein: 6.5 g/dL (ref 6.4–8.3)

## 2016-09-17 LAB — CBC WITH DIFFERENTIAL (CANCER CENTER ONLY)
BASO#: 0 10*3/uL (ref 0.0–0.2)
BASO%: 0.4 % (ref 0.0–2.0)
EOS ABS: 0 10*3/uL (ref 0.0–0.5)
EOS%: 0.4 % (ref 0.0–7.0)
HCT: 39 % (ref 38.7–49.9)
HGB: 12.9 g/dL — ABNORMAL LOW (ref 13.0–17.1)
LYMPH#: 1 10*3/uL (ref 0.9–3.3)
LYMPH%: 35.4 % (ref 14.0–48.0)
MCH: 30.3 pg (ref 28.0–33.4)
MCHC: 33.1 g/dL (ref 32.0–35.9)
MCV: 92 fL (ref 82–98)
MONO#: 0.9 10*3/uL (ref 0.1–0.9)
MONO%: 31 % — AB (ref 0.0–13.0)
NEUT#: 0.9 10*3/uL — ABNORMAL LOW (ref 1.5–6.5)
NEUT%: 32.8 % — AB (ref 40.0–80.0)
PLATELETS: 183 10*3/uL (ref 145–400)
RBC: 4.26 10*6/uL (ref 4.20–5.70)
RDW: 18.6 % — AB (ref 11.1–15.7)
WBC: 2.7 10*3/uL — AB (ref 4.0–10.0)

## 2016-09-18 ENCOUNTER — Other Ambulatory Visit: Payer: Self-pay | Admitting: *Deleted

## 2016-09-18 DIAGNOSIS — C9000 Multiple myeloma not having achieved remission: Secondary | ICD-10-CM

## 2016-09-18 LAB — MULTIPLE MYELOMA PANEL, SERUM
ALBUMIN SERPL ELPH-MCNC: 3.6 g/dL (ref 2.9–4.4)
Albumin/Glob SerPl: 1.4 (ref 0.7–1.7)
Alpha 1: 0.4 g/dL (ref 0.0–0.4)
Alpha2 Glob SerPl Elph-Mcnc: 0.8 g/dL (ref 0.4–1.0)
B-Globulin SerPl Elph-Mcnc: 0.9 g/dL (ref 0.7–1.3)
Gamma Glob SerPl Elph-Mcnc: 0.6 g/dL (ref 0.4–1.8)
Globulin, Total: 2.7 g/dL (ref 2.2–3.9)
IGA/IMMUNOGLOBULIN A, SERUM: 29 mg/dL — AB (ref 90–386)
IgG, Qn, Serum: 543 mg/dL — ABNORMAL LOW (ref 700–1600)
IgM, Qn, Serum: 21 mg/dL (ref 20–172)
M PROTEIN SERPL ELPH-MCNC: 0.3 g/dL — AB
TOTAL PROTEIN: 6.3 g/dL (ref 6.0–8.5)

## 2016-09-19 ENCOUNTER — Ambulatory Visit (HOSPITAL_BASED_OUTPATIENT_CLINIC_OR_DEPARTMENT_OTHER): Payer: 59 | Admitting: Hematology & Oncology

## 2016-09-19 ENCOUNTER — Other Ambulatory Visit (HOSPITAL_BASED_OUTPATIENT_CLINIC_OR_DEPARTMENT_OTHER): Payer: 59

## 2016-09-19 ENCOUNTER — Other Ambulatory Visit: Payer: 59

## 2016-09-19 VITALS — BP 123/76 | Temp 97.4°F | Wt 158.0 lb

## 2016-09-19 DIAGNOSIS — C9000 Multiple myeloma not having achieved remission: Secondary | ICD-10-CM

## 2016-09-19 DIAGNOSIS — M545 Low back pain: Secondary | ICD-10-CM | POA: Diagnosis not present

## 2016-09-19 DIAGNOSIS — R634 Abnormal weight loss: Secondary | ICD-10-CM | POA: Diagnosis not present

## 2016-09-19 DIAGNOSIS — Z9484 Stem cells transplant status: Secondary | ICD-10-CM | POA: Diagnosis not present

## 2016-09-19 DIAGNOSIS — Z86718 Personal history of other venous thrombosis and embolism: Secondary | ICD-10-CM | POA: Diagnosis not present

## 2016-09-19 DIAGNOSIS — I82412 Acute embolism and thrombosis of left femoral vein: Secondary | ICD-10-CM

## 2016-09-19 LAB — COMPREHENSIVE METABOLIC PANEL
ALT: 23 U/L (ref 0–55)
AST: 26 U/L (ref 5–34)
Albumin: 4.2 g/dL (ref 3.5–5.0)
Alkaline Phosphatase: 92 U/L (ref 40–150)
Anion Gap: 8 mEq/L (ref 3–11)
BUN: 10.8 mg/dL (ref 7.0–26.0)
CALCIUM: 9.8 mg/dL (ref 8.4–10.4)
CHLORIDE: 107 meq/L (ref 98–109)
CO2: 25 meq/L (ref 22–29)
Creatinine: 0.8 mg/dL (ref 0.7–1.3)
EGFR: >90 mL/min/{1.73_m2} (ref >90)
Glucose: 116 mg/dl (ref 70–140)
POTASSIUM: 4.4 meq/L (ref 3.5–5.1)
Sodium: 139 mEq/L (ref 136–145)
Total Bilirubin: 0.37 mg/dL (ref 0.20–1.20)
Total Protein: 7 g/dL (ref 6.4–8.3)

## 2016-09-19 LAB — CBC WITH DIFFERENTIAL (CANCER CENTER ONLY)
BASO#: 0 10*3/uL (ref 0.0–0.2)
BASO%: 0.3 % (ref 0.0–2.0)
EOS%: 0.3 % (ref 0.0–7.0)
Eosinophils Absolute: 0 10*3/uL (ref 0.0–0.5)
HEMATOCRIT: 39.5 % (ref 38.7–49.9)
HGB: 13.2 g/dL (ref 13.0–17.1)
LYMPH#: 1.2 10*3/uL (ref 0.9–3.3)
LYMPH%: 32.9 % (ref 14.0–48.0)
MCH: 30.4 pg (ref 28.0–33.4)
MCHC: 33.4 g/dL (ref 32.0–35.9)
MCV: 91 fL (ref 82–98)
MONO#: 0.9 10*3/uL (ref 0.1–0.9)
MONO%: 24.1 % — AB (ref 0.0–13.0)
NEUT#: 1.6 10*3/uL (ref 1.5–6.5)
NEUT%: 42.4 % (ref 40.0–80.0)
PLATELETS: 205 10*3/uL (ref 145–400)
RBC: 4.34 10*6/uL (ref 4.20–5.70)
RDW: 18.7 % — AB (ref 11.1–15.7)
WBC: 3.7 10*3/uL — ABNORMAL LOW (ref 4.0–10.0)

## 2016-09-19 NOTE — Progress Notes (Signed)
Hematology and Oncology Follow Up Visit  Rider Ermis [id number] 30-Apr-1956 61 y.o. 09/19/2016   Principle Diagnosis:  IgG Kappa myeloma - Hyperdiploid/+11 DVT of the LEFT and RIGHT leg Current Therapy:  S/p cycle #5 of RVD  Zometa 4 mg IV q month Xarelto 20 mg po q day - started on 03/21/2016 Palliative XRT to the back - start on 06/09/2016 Autologous stem cell transplant at [facility]-infused on 08/22/2016     Interim History:  Mr. Uher is back for follow-up. He went to [facility] for his transplant .[name] He had a tough time. Thankfully, he had no problems with infections. Apparently there was some issues with getting enough stem cells.  He has lost quite a bit of weight. He is still having the back issues. He has no compression fractures in his back. He has some muscle atrophy which probably are making the compression fractures a little worse.  His appetite is down. His taste buds really had a tough time. They will recover.  We have been checking his labs 3 times a week.  He is on Xarelto. He's had no problems with bleeding. He has not noted any leg swelling.  He's had no problems with fever. He's had no rashes. He's had no mouth sores.  Overall, his performance status is ECOG 2.   Medications:  Current Outpatient Prescriptions:  .  calcium carbonate (TUMS - DOSED IN MG ELEMENTAL CALCIUM) 500 MG chewable tablet, Chew 1 tablet by mouth as needed for indigestion or heartburn., Disp: , Rfl:  .  carisoprodol (SOMA) 350 MG tablet, Take 1 tablet (350 mg total) by mouth 4 (four) times daily as needed for muscle spasms., Disp: 90 tablet, Rfl: 3 .  Cholecalciferol (VITAMIN D-3) 5000 units TABS, Take 5,000 mg by mouth daily., Disp: , Rfl:  .  famciclovir (FAMVIR) 500 MG tablet, Take 1 tablet (500 mg total) by mouth daily. (Patient taking differently: Take 500 mg by mouth every morning. ), Disp: 30 tablet, Rfl: 8 .  fentaNYL (DURAGESIC - DOSED MCG/HR) 50 MCG/HR, Place 1 patch (50 mcg total) onto the  skin every other day., Disp: 15 patch, Rfl: 0 .  HYDROcodone-acetaminophen (NORCO) 10-325 MG tablet, Take 0.5-1 tablets by mouth every 8 (eight) hours as needed for moderate pain., Disp: 60 tablet, Rfl: 0 .  LORazepam (ATIVAN) 0.5 MG tablet, Take 1 tablet (0.5 mg total) by mouth every 6 (six) hours as needed (Nausea or vomiting)., Disp: 30 tablet, Rfl: 0 .  magic mouthwash w/lidocaine SOLN, Take 5 mLs by mouth 3 (three) times daily as needed for mouth pain. (Patient not taking: Reported on 09/16/2016), Disp: 450 mL, Rfl: 3 .  magnesium oxide (MAG-OX) 400 MG tablet, Take 400 mg by mouth daily., Disp: , Rfl:  .  ondansetron (ZOFRAN-ODT) 8 MG disintegrating tablet, Take 1 tablet (8 mg total) by mouth every 8 (eight) hours as needed., Disp: 60 tablet, Rfl: 1 .  Oxycodone HCl 10 MG TABS, Take 1 tablet (10 mg total) by mouth every 4 (four) hours as needed., Disp: 30 tablet, Rfl: 0 .  polyethylene glycol powder (MIRALAX) powder, Take 17 g by mouth daily. (Patient not taking: Reported on 09/16/2016), Disp: 850 g, Rfl: 3 .  Probiotic Product (PROBIOTIC PO), Take 1 capsule by mouth daily., Disp: , Rfl:  .  prochlorperazine (COMPAZINE) 10 MG tablet, Take 1 tablet (10 mg total) by mouth every 6 (six) hours as needed for nausea or vomiting. (Patient not taking: Reported on 09/16/2016), Disp: 30 tablet, Rfl:  3 .  ranitidine (ZANTAC) 150 MG tablet, Take 150 mg by mouth as needed for heartburn., Disp: , Rfl:  .  rivaroxaban (XARELTO) 20 MG TABS tablet, Take 1 tablet (20 mg total) by mouth daily with supper., Disp: 30 tablet, Rfl: 4 .  senna (SENOKOT) 8.6 MG TABS tablet, Take 2 tablets (17.2 mg total) by mouth daily. (Patient not taking: Reported on 09/16/2016), Disp: 60 each, Rfl: 2 .  sertraline (ZOLOFT) 100 MG tablet, Take 100 mg by mouth at bedtime. , Disp: , Rfl: 2 .  zolpidem (AMBIEN) 10 MG tablet, Take 1 tablet (10 mg total) by mouth at bedtime as needed. (Patient taking differently: Take 5 mg by mouth at bedtime. ),  Disp: 30 tablet, Rfl: 3  Allergies: No Known Allergies  Past Medical History, Surgical history, Social history, and Family History were reviewed and updated.  Review of Systems: As above  Physical Exam:  weight is 158 lb (71.7 kg). His oral temperature is 97.4 F (36.3 C). His blood pressure is 123/76. His oxygen saturation is 98%.   Wt Readings from Last 3 Encounters:  09/19/16 158 lb (71.7 kg)  07/28/16 173 lb 12.8 oz (78.8 kg)  07/02/16 172 lb 12.8 oz (78.4 kg)     Thin white male. He is in no distress. Head and neck exam shows no ocular or oral lesions. He does have some temporal muscle wasting. He has no palpable cervical or supraclavicular lymph nodes. He has no palpable thyroid. Lungs are clear bilaterally. Cardiac exam regular rate and rhythm with no murmurs, rubs or bruits. Abdomen is soft. He has good bowel sounds. There is no fluid wave. There is no palpable liver or spleen tip. Back exam shows some tenderness in the midthoracic spine. There is some tenderness along the right thoracic paravertebral muscles. Extremities shows no clubbing, cyanosis or edema. He has good range of motion of his joints. He has good strength in his extremities. Neurological exam shows no focal neurological deficits. Skin exam shows no rashes, ecchymoses or petechia.   Lab Results  Component Value Date   WBC 3.7 (L) 09/19/2016   HGB 13.2 09/19/2016   HCT 39.5 09/19/2016   MCV 91 09/19/2016   PLT 205 09/19/2016     Chemistry      Component Value Date/Time   NA 139 09/19/2016 1150   K 4.4 09/19/2016 1150   CL 103 09/15/2016 1105   CO2 25 09/19/2016 1150   BUN 10.8 09/19/2016 1150   CREATININE 0.8 09/19/2016 1150      Component Value Date/Time   CALCIUM 9.8 09/19/2016 1150   ALKPHOS 92 09/19/2016 1150   AST 26 09/19/2016 1150   ALT 23 09/19/2016 1150   BILITOT 0.37 09/19/2016 1150         Impression and Plan: Mr. Daniel is a 61 year old white male. He actually looks to be in good  shape. He has IgG Kappa Myeloma.  He had his transplant at [facility] back in February. He is looking good. He is lost quite a bit overweight. I told mother weight will go back on him. It may take another several months.  I feel bad for his back. He is taking physical therapy for this. I think with all the weight loss, he just has muscle atrophy which is making his back pain a little bit worse.  I really healthy we had to have his labs done 3 times a week now. I think weekly would be fine.  I do want  to check his legs for thromboembolic disease. He has a history of thromboembolic disease in his legs. He is on Xarelto. I think if the thrombi are gone, then we might be oh to get him on a 10 mg maintenance dose.  I will see him back myself in about 3 weeks. Again we can see him sooner if he is having any problems.   We spent about 25 minutes with him. ful that he is moving in the right direction for transplant soon.    [name], MD 3/9/20184:59 PM

## 2016-09-22 ENCOUNTER — Other Ambulatory Visit: Payer: 59

## 2016-09-23 ENCOUNTER — Ambulatory Visit: Payer: 59 | Admitting: Physical Therapy

## 2016-09-23 ENCOUNTER — Ambulatory Visit (HOSPITAL_BASED_OUTPATIENT_CLINIC_OR_DEPARTMENT_OTHER)
Admission: RE | Admit: 2016-09-23 | Discharge: 2016-09-23 | Disposition: A | Discharge status: Home / Self Care | Payer: 59 | Source: Ambulatory Visit | Attending: Hematology & Oncology | Admitting: Hematology & Oncology

## 2016-09-23 DIAGNOSIS — I82611 Acute embolism and thrombosis of superficial veins of right upper extremity: Secondary | ICD-10-CM | POA: Diagnosis not present

## 2016-09-23 DIAGNOSIS — I82412 Acute embolism and thrombosis of left femoral vein: Secondary | ICD-10-CM | POA: Diagnosis not present

## 2016-09-23 DIAGNOSIS — R293 Abnormal posture: Secondary | ICD-10-CM | POA: Diagnosis not present

## 2016-09-23 DIAGNOSIS — M546 Pain in thoracic spine: Secondary | ICD-10-CM

## 2016-09-23 DIAGNOSIS — I82811 Embolism and thrombosis of superficial veins of right lower extremities: Secondary | ICD-10-CM | POA: Insufficient documentation

## 2016-09-23 DIAGNOSIS — M6281 Muscle weakness (generalized): Secondary | ICD-10-CM

## 2016-09-23 NOTE — Therapy (Signed)
[facility] [address]  [place], [place], [address] Phone: [phone]   Fax:  [phone]  Physical Therapy Treatment  Patient Details  Name: Jorge [name] MRN: [id number] Date of Birth: May 22, 1956 Referring Provider: Lovena Neighbours   Encounter Date: 09/23/2016      PT End of Session - 09/23/16 1700    Visit Number 2   Number of Visits 9   Date for PT Re-Evaluation 10/14/16   PT Start Time 1700   PT Stop Time 1742   PT Time Calculation (min) 42 min   Activity Tolerance Patient tolerated treatment well;No increased pain   Behavior During Therapy WFL for tasks assessed/performed      Past Medical History:  Diagnosis Date  . Anxiety   . Bone metastasis ([place])   . Chest cold 05/19/2016   productive cough  -- started on antibiotic  . Chronic back pain    due to bone mets from myeloma  . Cough   . Depression   . GERD (gastroesophageal reflux disease)   . Hiatal hernia   . History of chicken pox   . History of concussion    age 48 -- no residual  . History of DVT of lower extremity 03/21/2016  treated and completed w/ xarelto   per doppler left extensive occlusion common femoral, femoral, and popliteal veins and right partial occlusion common femoral and profunda femoral veins/  last doppler 06-04-2016 no evidence acute or chronic dvt noted either leg   . History of radiation therapy 06/09/16-06/23/16   lower thoracic spine 25 Gy in 10 fractions  . Mouth ulcers    secondary to radiation  . Multiple myeloma ([place]) dx 02/22/2016 via bone marrow bx---  oncologist-  [name]   IgG Kappa-- Hyperdiploid/ +11 w/ bone mets--  current treatment chemotherapy (started 08/ 2017)and pallitive radiation to back started 06-09-2016  . Renal calculus, right   . Wears contact lenses     Past Surgical History:  Procedure Laterality Date  . COLONOSCOPY  [id number]  . CYSTOSCOPY W/ URETERAL STENT PLACEMENT Right 06/20/2016   Procedure: CYSTOSCOPY WITH STENT REPLACEMENT;  Surgeon: Kathie Rhodes, MD;  Location: [facility];  Service: Urology;  Laterality: Right;  . CYSTOSCOPY WITH RETROGRADE PYELOGRAM, URETEROSCOPY AND STENT PLACEMENT Right 05/30/2016   Procedure: CYSTOSCOPY WITH RETROGRADE PYELOGRAM, URETEROSCOPY AND STENT PLACEMENT,DILITATION URETERAL STRICTURE;  Surgeon: Kathie Rhodes, MD;  Location: [facility];  Service: Urology;  Laterality: Right;  . CYSTOSCOPY/RETROGRADE/URETEROSCOPY/STONE EXTRACTION WITH BASKET Right 06/20/2016   Procedure: CYSTOSCOPY/URETEROSCOPY/STONE EXTRACTION WITH BASKET;  Surgeon: Kathie Rhodes, MD;  Location: [facility];  Service: Urology;  Laterality: Right;  . HOLMIUM LASER APPLICATION Right 19/10/1738   Procedure: HOLMIUM LASER APPLICATION;  Surgeon: Kathie Rhodes, MD;  Location: [facility];  Service: Urology;  Laterality: Right;  . IR GENERIC HISTORICAL  02/11/2016   IR RADIOLOGIST EVAL & MGMT 02/11/2016 [facility]  . IR GENERIC HISTORICAL  02/15/2016   IR BONE TUMOR(S)RF ABLATION 02/15/2016 Luanne Bras, MD [facility]  . IR GENERIC HISTORICAL  02/15/2016   IR BONE TUMOR(S)RF ABLATION 02/15/2016 Luanne Bras, MD [facility]  . IR GENERIC HISTORICAL  02/15/2016   IR BONE TUMOR(S)RF ABLATION 02/15/2016 Luanne Bras, MD [facility]  . IR GENERIC HISTORICAL  02/15/2016   IR KYPHO THORACIC WITH BONE BIOPSY 02/15/2016 Luanne Bras, MD [facility]  . IR GENERIC HISTORICAL  02/15/2016   IR KYPHO THORACIC WITH BONE BIOPSY 02/15/2016  Luanne Bras, MD [facility]  . IR GENERIC HISTORICAL  02/15/2016   IR VERTEBROPLASTY CERV/THOR BX INC UNI/BIL INC/INJECT/IMAGING 02/15/2016 Luanne Bras, MD [facility]  . IR GENERIC HISTORICAL  03/13/2016   IR KYPHO EA ADDL LEVEL THORACIC OR LUMBAR 03/13/2016 Luanne Bras, MD [facility]  . IR GENERIC HISTORICAL  03/13/2016   IR KYPHO EA ADDL LEVEL THORACIC OR LUMBAR 03/13/2016 Luanne Bras, MD  [facility]  . IR GENERIC HISTORICAL  03/13/2016   IR BONE TUMOR(S)RF ABLATION 03/13/2016 Luanne Bras, MD [facility]  . IR GENERIC HISTORICAL  03/13/2016   IR KYPHO LUMBAR INC FX REDUCE BONE BX UNI/BIL CANNULATION INC/IMAGING 03/13/2016 Luanne Bras, MD [facility]  . IR GENERIC HISTORICAL  03/13/2016   IR BONE TUMOR(S)RF ABLATION 03/13/2016 Luanne Bras, MD [facility]  . IR GENERIC HISTORICAL  03/13/2016   IR BONE TUMOR(S)RF ABLATION 03/13/2016 Luanne Bras, MD [facility]  . IR GENERIC HISTORICAL  03/31/2016   IR RADIOLOGIST EVAL & MGMT 03/31/2016 [facility]  . LAPAROSCOPIC INGUINAL HERNIA REPAIR Bilateral 12-16-2013  dr gross  . RADIOLOGY WITH ANESTHESIA N/A 02/15/2016   Procedure: Spinal Ablation;  Surgeon: Luanne Bras, MD;  Location: [facility];  Service: Radiology;  Laterality: N/A;  . RADIOLOGY WITH ANESTHESIA N/A 03/13/2016   Procedure: LUMBER ABLATION;  Surgeon: Luanne Bras, MD;  Location: [facility];  Service: Radiology;  Laterality: N/A;  . ROTATOR CUFF REPAIR Right 2003  . TONSILLECTOMY  age 74  . WISDOM TOOTH EXTRACTION      There were no vitals filed for this visit.      Subjective Assessment - 09/23/16 1708    Subjective Pt reports still not able to tolerate supine meeks decompression exercises at home but completing standing versions 3x/day x20 reps.   Pertinent History multipe myeloma with multiple vertebral compression fractures with kyphplasties and vertbuloplasties.  Pt had stem cell tranplant on Feb. 9, 2018 at [facility] and reports that his bloodwork levels are OK.  He has them checked by Dr. Marin Olp Frequently.  He is referred for "outpatient physical therapy for strength and conditioning. Due to extensive vertebral involvement, and compression fractures. avoid all flexion based activity and not lifting> 10 pounds"   Patient Stated Goals be able to strenthen  my back and reduce pain, increase walking and drive a care without pain    Currently in  Pain? Yes   Pain Score 4    Pain Location Back   Pain Orientation Medial   Pain Descriptors / Indicators Aching   Pain Type Chronic pain   Pain Onset More than a month ago   Pain Frequency Constant            [facility] PT Assessment - 09/23/16 1700      6 Minute Walk- Baseline   6 Minute Walk- Baseline yes   HR (bpm) 99   02 Sat (%RA) 92 %   Modified Borg Scale for Dyspnea 0- Nothing at all   Perceived Rate of Exertion (Borg) 6-     6 Minute walk- Post Test   6 Minute Walk Post Test yes   HR (bpm) 96   02 Sat (%RA) 92 %   Modified Borg Scale for Dyspnea 0- Nothing at all   Perceived Rate of Exertion (Borg) 7- Very, very light     6 minute walk test results    Aerobic Endurance Distance Walked [address] Adult PT Treatment/Exercise - 09/23/16 1700      Bed Mobility   Bed Mobility Sit to Sidelying Left;Sit to Supine;Rolling Left;Left Sidelying to Sit   Rolling Left 5: Supervision   Left Sidelying to Sit 5: Supervision   Sit to Supine 5: Supervision   Sit to Sidelying Left 5: Supervision     Self-Care   Self-Care Other Self-Care Comments   Other Self-Care Comments  Pt instructed in log rolling & sit <>sidelying technique to minimize rotational forces on lumbar spine during supine <> sit transitions.     Lumbar Exercises: Standing   Scapular Retraction Both;10 reps;Theraband   Theraband Level (Scapular Retraction) Level 1 (Yellow)   Scapular Retraction Limitations 3" hold   Row Both;10 reps;Theraband   Theraband Level (Row) Level 1 (Yellow)   Row Limitations 3" hold   Other Standing Lumbar Exercises Meeks decompression excercises: B shoudler press 10x3" and Unilateral leg press 10x3" each     Lumbar Exercises: Supine   Other Supine Lumbar Exercises Meeks decompression excercise x 5', B shoudler press 20x3", Head press 10x3", Unilateral leg press 10x3" each                     PT Long Term Goals - 09/23/16 1749      PT  LONG TERM GOAL #1   Title Pt will report a decrease in pain so that he is able to sit at his computer and work 30 minutes by 10/14/16   Status On-going     PT LONG TERM GOAL #2   Title Pt will increase number of repetitions of sit to stand to 14 in 30 seconds to demonstrate and increase in functional strength by 10/14/16   Status On-going     PT LONG TERM GOAL #3   Title Pt will be independent in a home program of exercise to increase back extension strength and decrease pain by 10/14/16   Status On-going     PT LONG TERM GOAL #4   Title Pt will report he can drive his car short distances without pain to increase his functional independence by 10/14/16   Status On-going           Long Term Clinic Goals - 09/16/16 1705      CC Long Term Goal  #1   Title Pt will report a decrease in pain so that he is able to sit at his computer and work 30 minutes    Time 4   Period Weeks   Status New     CC Long Term Goal  #2   Title Pt will increase number of repetitions of sit to stand to 14 in 30 seconds to demonstrate and increase in functional strength   Time 4   Period Weeks   Status New     CC Long Term Goal  #3   Title Pt will be independent in a home program of exercise to increase back extension strength and decrease pain.   Time 4   Period Weeks     CC Long Term Goal  #4   Title Pt will report he can drive his car short distances without pain to increase his functional independence    Time 4   Period Weeks   Status New            Plan - 09/23/16 1712    Clinical Impression Statement Pt reporting good compliance with initial HEP completing standing Meeks exercises 20 reps 3x/day  with no discomfort other than abdominal muscle soreness. Completed 6 min walk test with stable VS, no DOE and distance walked 896 ft. Pt able to tolerate introduction of supported supine Meeks decompression exercises with 5 minute tolerance for positioning prior to introduction of shoulder, head and leg  press. Pt reporting pain starting to increase by end of exercises but resolved upon return to sitting.   Rehab Potential Good   Clinical Impairments Affecting Rehab Potential multiple spinal compression fractures, recovering blood parameters due to stem cell transplant    PT Treatment/Interventions ADLs/Self Care Home Management;Patient/family education;Energy conservation;Gait training;Functional mobility training;Therapeutic activities;Therapeutic exercise;Balance training;Neuromuscular re-education;Moist Heat;Electrical Stimulation;Cryotherapy;Stair training   PT Next Visit Plan progress supported supine position for meeks decompression exercise, standing UE and LE extension exercise   Consulted and Agree with Plan of Care Patient      Patient will benefit from skilled therapeutic intervention in order to improve the following deficits and impairments:  Decreased endurance, Decreased knowledge of precautions, Decreased activity tolerance, Decreased strength, Pain, Decreased mobility, Postural dysfunction, Impaired perceived functional ability  Visit Diagnosis: Abnormal posture  Muscle weakness (generalized)  Pain in thoracic spine     Problem List Patient Active Problem List   Diagnosis Date Noted  . Phlebitis or thrombophlebitis of lower extremity ([place]) 07/21/2016  . Visit for preventive health examination 05/16/2016  . Aphthous ulcer 05/16/2016  . Ureterolithiasis 05/16/2016  . Acute deep vein thrombosis (DVT) of femoral vein of left lower extremity ([place]) 03/21/2016  . Multiple myeloma ([place]) 02/22/2016  . Multiple myeloma not having achieved remission ([place]) 02/22/2016  . LBP (low back pain) 11/02/2013  . Bilateral inguinal hernia (BIH), s/p lap repair 12/16/2013 11/02/2013    Percival Spanish, PT, MPT  09/23/2016, 6:14 PM  [facility] [address]  [place], [place], [address] Phone: [phone]   Fax:   [phone]  Name: Jorge [name] MRN: [id number] Date of Birth: 1955/10/02

## 2016-09-24 ENCOUNTER — Other Ambulatory Visit: Payer: 59

## 2016-09-24 ENCOUNTER — Telehealth: Payer: Self-pay | Admitting: *Deleted

## 2016-09-24 NOTE — Telephone Encounter (Signed)
Venous doppler call report received  Requesting we notify Dr. Marin Olp of the following impression.  IMPRESSION: 1. Noncompressible nonocclusive thrombus of the right great saphenous vein origin and upper vein. 2. Otherwise no evidence for deep venous thrombosis in the bilateral lower extremities. These results will be called to the ordering clinician or representative by the Radiologist Assistant, and communication documented in the PACS or zVision Dashboard.   Electronically Signed   By: Kristine Garbe M.D.   On: 09/23/2016 19:39

## 2016-09-26 ENCOUNTER — Other Ambulatory Visit: Payer: 59

## 2016-09-26 ENCOUNTER — Other Ambulatory Visit (HOSPITAL_BASED_OUTPATIENT_CLINIC_OR_DEPARTMENT_OTHER): Payer: 59

## 2016-09-26 DIAGNOSIS — C9000 Multiple myeloma not having achieved remission: Secondary | ICD-10-CM

## 2016-09-26 LAB — COMPREHENSIVE METABOLIC PANEL
ALBUMIN: 3.9 g/dL (ref 3.5–5.0)
ALT: 26 U/L (ref 0–55)
AST: 27 U/L (ref 5–34)
Alkaline Phosphatase: 78 U/L (ref 40–150)
Anion Gap: 7 mEq/L (ref 3–11)
BILIRUBIN TOTAL: 0.35 mg/dL (ref 0.20–1.20)
BUN: 12 mg/dL (ref 7.0–26.0)
CO2: 25 mEq/L (ref 22–29)
Calcium: 9.6 mg/dL (ref 8.4–10.4)
Chloride: 109 mEq/L (ref 98–109)
Creatinine: 0.8 mg/dL (ref 0.7–1.3)
GLUCOSE: 85 mg/dL (ref 70–140)
Potassium: 4 mEq/L (ref 3.5–5.1)
SODIUM: 141 meq/L (ref 136–145)
TOTAL PROTEIN: 6.4 g/dL (ref 6.4–8.3)

## 2016-09-26 LAB — CBC WITH DIFFERENTIAL (CANCER CENTER ONLY)
BASO#: 0 10*3/uL (ref 0.0–0.2)
BASO%: 0.6 % (ref 0.0–2.0)
EOS%: 1.7 % (ref 0.0–7.0)
Eosinophils Absolute: 0.1 10*3/uL (ref 0.0–0.5)
HCT: 40.6 % (ref 38.7–49.9)
HGB: 13.6 g/dL (ref 13.0–17.1)
LYMPH#: 1.2 10*3/uL (ref 0.9–3.3)
LYMPH%: 34.7 % (ref 14.0–48.0)
MCH: 30.7 pg (ref 28.0–33.4)
MCHC: 33.5 g/dL (ref 32.0–35.9)
MCV: 92 fL (ref 82–98)
MONO#: 0.9 10*3/uL (ref 0.1–0.9)
MONO%: 25.9 % — AB (ref 0.0–13.0)
NEUT#: 1.3 10*3/uL — ABNORMAL LOW (ref 1.5–6.5)
NEUT%: 37.1 % — AB (ref 40.0–80.0)
PLATELETS: 198 10*3/uL (ref 145–400)
RBC: 4.43 10*6/uL (ref 4.20–5.70)
RDW: 18 % — AB (ref 11.1–15.7)
WBC: 3.4 10*3/uL — AB (ref 4.0–10.0)

## 2016-09-29 ENCOUNTER — Other Ambulatory Visit: Payer: 59

## 2016-09-30 ENCOUNTER — Ambulatory Visit: Payer: 59 | Admitting: Physical Therapy

## 2016-09-30 DIAGNOSIS — R293 Abnormal posture: Secondary | ICD-10-CM | POA: Diagnosis not present

## 2016-09-30 DIAGNOSIS — M546 Pain in thoracic spine: Secondary | ICD-10-CM

## 2016-09-30 DIAGNOSIS — M6281 Muscle weakness (generalized): Secondary | ICD-10-CM

## 2016-09-30 NOTE — Therapy (Signed)
[facility] [address]  [place], [place], [address] Phone: [phone]   Fax:  [phone]  Physical Therapy Treatment  Patient Details  Name: Jorge [name] MRN: [id number] Date of Birth: [birth year] Referring Provider: Lovena Neighbours   Encounter Date: 09/30/2016      PT End of Session - 09/30/16 1703    Visit Number 3   Number of Visits 9   Date for PT Re-Evaluation 10/14/16   PT Start Time 4270   PT Stop Time 6237   PT Time Calculation (min) 42 min   Activity Tolerance Patient tolerated treatment well;No increased pain   Behavior During Therapy WFL for tasks assessed/performed      Past Medical History:  Diagnosis Date  . Anxiety   . Bone metastasis ([place])   . Chest cold 05/19/2016   productive cough  -- started on antibiotic  . Chronic back pain    due to bone mets from myeloma  . Cough   . Depression   . GERD (gastroesophageal reflux disease)   . Hiatal hernia   . History of chicken pox   . History of concussion    age 86 -- no residual  . History of DVT of lower extremity 03/21/2016  treated and completed w/ xarelto   per doppler left extensive occlusion common femoral, femoral, and popliteal veins and right partial occlusion common femoral and profunda femoral veins/  last doppler 06-04-2016 no evidence acute or chronic dvt noted either leg   . History of radiation therapy 06/09/16-06/23/16   lower thoracic spine 25 Gy in 10 fractions  . Mouth ulcers    secondary to radiation  . Multiple myeloma ([place]) dx 02/22/2016 via bone marrow bx---  oncologist-  [name]   IgG Kappa-- Hyperdiploid/ +11 w/ bone mets--  current treatment chemotherapy (started 08/ 2017)and pallitive radiation to back started 06-09-2016  . Renal calculus, right   . Wears contact lenses     Past Surgical History:  Procedure Laterality Date  . COLONOSCOPY  [id number]  . CYSTOSCOPY W/ URETERAL STENT PLACEMENT Right 06/20/2016   Procedure: CYSTOSCOPY WITH STENT REPLACEMENT;  Surgeon: Kathie Rhodes, MD;  Location: [facility];  Service: Urology;  Laterality: Right;  . CYSTOSCOPY WITH RETROGRADE PYELOGRAM, URETEROSCOPY AND STENT PLACEMENT Right 05/30/2016   Procedure: CYSTOSCOPY WITH RETROGRADE PYELOGRAM, URETEROSCOPY AND STENT PLACEMENT,DILITATION URETERAL STRICTURE;  Surgeon: Kathie Rhodes, MD;  Location: [facility];  Service: Urology;  Laterality: Right;  . CYSTOSCOPY/RETROGRADE/URETEROSCOPY/STONE EXTRACTION WITH BASKET Right 06/20/2016   Procedure: CYSTOSCOPY/URETEROSCOPY/STONE EXTRACTION WITH BASKET;  Surgeon: Kathie Rhodes, MD;  Location: [facility];  Service: Urology;  Laterality: Right;  . HOLMIUM LASER APPLICATION Right 62/02/3150   Procedure: HOLMIUM LASER APPLICATION;  Surgeon: Kathie Rhodes, MD;  Location: [facility];  Service: Urology;  Laterality: Right;  . IR GENERIC HISTORICAL  02/11/2016   IR RADIOLOGIST EVAL & MGMT 02/11/2016 [facility]  . IR GENERIC HISTORICAL  02/15/2016   IR BONE TUMOR(S)RF ABLATION 02/15/2016 Luanne Bras, MD [facility]  . IR GENERIC HISTORICAL  02/15/2016   IR BONE TUMOR(S)RF ABLATION 02/15/2016 Luanne Bras, MD [facility]  . IR GENERIC HISTORICAL  02/15/2016   IR BONE TUMOR(S)RF ABLATION 02/15/2016 Luanne Bras, MD [facility]  . IR GENERIC HISTORICAL  02/15/2016   IR KYPHO THORACIC WITH BONE BIOPSY 02/15/2016 Luanne Bras, MD [facility]  . IR GENERIC HISTORICAL  02/15/2016   IR KYPHO THORACIC WITH BONE BIOPSY 02/15/2016  Luanne Bras, MD [facility]  . IR GENERIC HISTORICAL  02/15/2016   IR VERTEBROPLASTY CERV/THOR BX INC UNI/BIL INC/INJECT/IMAGING 02/15/2016 Luanne Bras, MD [facility]  . IR GENERIC HISTORICAL  03/13/2016   IR KYPHO EA ADDL LEVEL THORACIC OR LUMBAR 03/13/2016 Luanne Bras, MD [facility]  . IR GENERIC HISTORICAL  03/13/2016   IR KYPHO EA ADDL LEVEL THORACIC OR LUMBAR 03/13/2016 Luanne Bras, MD  [facility]  . IR GENERIC HISTORICAL  03/13/2016   IR BONE TUMOR(S)RF ABLATION 03/13/2016 Luanne Bras, MD [facility]  . IR GENERIC HISTORICAL  03/13/2016   IR KYPHO LUMBAR INC FX REDUCE BONE BX UNI/BIL CANNULATION INC/IMAGING 03/13/2016 Luanne Bras, MD [facility]  . IR GENERIC HISTORICAL  03/13/2016   IR BONE TUMOR(S)RF ABLATION 03/13/2016 Luanne Bras, MD [facility]  . IR GENERIC HISTORICAL  03/13/2016   IR BONE TUMOR(S)RF ABLATION 03/13/2016 Luanne Bras, MD [facility]  . IR GENERIC HISTORICAL  03/31/2016   IR RADIOLOGIST EVAL & MGMT 03/31/2016 [facility]  . LAPAROSCOPIC INGUINAL HERNIA REPAIR Bilateral 12-16-2013  dr gross  . RADIOLOGY WITH ANESTHESIA N/A 02/15/2016   Procedure: Spinal Ablation;  Surgeon: Luanne Bras, MD;  Location: [facility];  Service: Radiology;  Laterality: N/A;  . RADIOLOGY WITH ANESTHESIA N/A 03/13/2016   Procedure: LUMBER ABLATION;  Surgeon: Luanne Bras, MD;  Location: [facility];  Service: Radiology;  Laterality: N/A;  . ROTATOR CUFF REPAIR Right 2003  . TONSILLECTOMY  age 61  . WISDOM TOOTH EXTRACTION      There were no vitals filed for this visit.      Subjective Assessment - 09/30/16 1707    Subjective Pt reports still not able to tolerate supine meeks decompression exercises at home but completing standing versions 3x/day x20 reps.   Pertinent History multipe myeloma with multiple vertebral compression fractures with kyphplasties and vertbuloplasties.  Pt had stem cell tranplant on Feb. 9, 2018 at [facility] and reports that his bloodwork levels are OK.  He has them checked by Dr. Marin Olp Frequently.  He is referred for "outpatient physical therapy for strength and conditioning. Due to extensive vertebral involvement, and compression fractures. avoid all flexion based activity and not lifting> 10 pounds"   Patient Stated Goals be able to strenthen  my back and reduce pain, increase walking and drive a care without pain    Currently in  Pain? Yes   Pain Score 3    Pain Location Back   Pain Orientation Medial   Pain Descriptors / Indicators Aching   Pain Type Chronic pain   Pain Radiating Towards wraps around ribs to front at 1-2/10   Pain Onset More than a month ago   Pain Frequency Constant   Aggravating Factors  excessive movement or walking, sitting at my computer, laying on a hard surface   Pain Relieving Factors soft surface   Effect of Pain on Daily Activities limited by pain                         [facility] Adult PT Treatment/Exercise - 09/30/16 1703      Lumbar Exercises: Standing   Heel Raises 10 reps;3 seconds   Heel Raises Limitations UE support on counter   Functional Squats 10 reps;3 seconds   Functional Squats Limitations minisquat, UE support on counter   Scapular Retraction Both;10 reps;Theraband   Scapular Retraction Limitations back along pool noodle on wall   Row Both;Theraband;15 reps  2 sets   Theraband  Level (Row) Level 1 (Yellow);Level 3 (Green)   Row Limitations 3" hold   Shoulder Extension Both;15 reps;Theraband  2 sets   Theraband Level (Shoulder Extension) Level 1 (Yellow);Level 3 (Green)   Shoulder Extension Limitations to neutral + scap retraction, 3" hold     Lumbar Exercises: Supine   Other Supine Lumbar Exercises Meeks decompression excercise x 5', B shoudler press 20x3", Head press 10x3", Unilateral leg press 10x3" each     Knee/Hip Exercises: Standing   Hip Abduction Both;10 reps;Knee straight   Abduction Limitations 3" hold, UE support on counter   Hip Extension Both;10 reps;Knee straight   Extension Limitations 3" hold, UE support on counter     Manual Therapy   Manual Therapy Soft tissue mobilization;Taping   Manual therapy comments pt seated facing back of chair   Soft tissue mobilization B paraspinals R>L   Kinesiotex Inhibit Muscle     Kinesiotix   Inhibit Muscle  2 parallel "I" strips 30% along paraspinals from lower lumbar to upper thoracic  spine, peripendicular "I" strip 50% at area of greatest pain/muscle tension (lower thoracic spine)                PT Education - 09/30/16 1745    Education provided Yes   Education Details Standing extension based exercises (green TB for rows), instructions for kinesiotape wearing schedule and removal   Person(s) Educated Patient   Methods Explanation;Demonstration;Handout   Comprehension Verbalized understanding;Returned demonstration;Need further instruction             PT Long Term Goals - 09/23/16 1749      PT LONG TERM GOAL #1   Title Pt will report a decrease in pain so that he is able to sit at his computer and work 30 minutes by 10/14/16   Status On-going     PT LONG TERM GOAL #2   Title Pt will increase number of repetitions of sit to stand to 14 in 30 seconds to demonstrate and increase in functional strength by 10/14/16   Status On-going     PT LONG TERM GOAL #3   Title Pt will be independent in a home program of exercise to increase back extension strength and decrease pain by 10/14/16   Status On-going     PT LONG TERM GOAL #4   Title Pt will report he can drive his car short distances without pain to increase his functional independence by 10/14/16   Status On-going           Long Term Clinic Goals - 09/16/16 1705      [facility] Long Term Goal  #1   Title Pt will report a decrease in pain so that he is able to sit at his computer and work 30 minutes    Time 4   Period Weeks   Status New     CC Long Term Goal  #2   Title Pt will increase number of repetitions of sit to stand to 14 in 30 seconds to demonstrate and increase in functional strength   Time 4   Period Weeks   Status New     CC Long Term Goal  #3   Title Pt will be independent in a home program of exercise to increase back extension strength and decrease pain.   Time 4   Period Weeks     CC Long Term Goal  #4   Title Pt will report he can drive his car short distances without pain to  increase his functional independence    Time 4   Period Weeks   Status New            Plan - 09/30/16 1745    Clinical Impression Statement Pt continues to report limited tolerance for supine positioning on floor (bed too soft) for Meeks decompression exercises at home, but able to tolerate these on therapy mat table. Good tolerance for standing exercise progression. Pt noting increased thoracic paraspinal pain after prolonged walking, with increased muscle tension noted in this area. Attempted STM to paraspinals but pt unable to tolerate prone positioning, therefore pt seated straddling chair facin back of chair with pillow for anterior support. Trial of kinesiotaping to paraspinals initiated today and will assess response at next visit.   Rehab Potential Good   Clinical Impairments Affecting Rehab Potential multiple spinal compression fractures, recovering blood parameters due to stem cell transplant    PT Treatment/Interventions ADLs/Self Care Home Management;Patient/family education;Energy conservation;Gait training;Functional mobility training;Therapeutic activities;Therapeutic exercise;Balance training;Neuromuscular re-education;Moist Heat;Electrical Stimulation;Cryotherapy;Stair training   PT Next Visit Plan assess response to taping; progress supported supine position for meeks decompression exercise, standing UE and LE extension exercise   Consulted and Agree with Plan of Care Patient      Patient will benefit from skilled therapeutic intervention in order to improve the following deficits and impairments:  Decreased endurance, Decreased knowledge of precautions, Decreased activity tolerance, Decreased strength, Pain, Decreased mobility, Postural dysfunction, Impaired perceived functional ability  Visit Diagnosis: Abnormal posture  Muscle weakness (generalized)  Pain in thoracic spine     Problem List Patient Active Problem List   Diagnosis Date Noted  . Phlebitis or  thrombophlebitis of lower extremity ([place]) 07/21/2016  . Visit for preventive health examination 05/16/2016  . Aphthous ulcer 05/16/2016  . Ureterolithiasis 05/16/2016  . Acute deep vein thrombosis (DVT) of femoral vein of left lower extremity ([place]) 03/21/2016  . Multiple myeloma ([place]) 02/22/2016  . Multiple myeloma not having achieved remission ([place]) 02/22/2016  . LBP (low back pain) 11/02/2013  . Bilateral inguinal hernia (BIH), s/p lap repair 12/16/2013 11/02/2013    Percival Spanish, PT, MPT 09/30/2016, 6:30 PM  [facility] [address]  [place], [place], [address] Phone: [phone]   Fax:  [phone]  Name: Jorge [name] MRN: [id number] Date of Birth: 1955/11/21

## 2016-10-01 ENCOUNTER — Other Ambulatory Visit: Payer: 59

## 2016-10-03 ENCOUNTER — Ambulatory Visit: Payer: 59 | Admitting: Physical Therapy

## 2016-10-03 ENCOUNTER — Other Ambulatory Visit: Payer: 59

## 2016-10-03 ENCOUNTER — Other Ambulatory Visit (HOSPITAL_BASED_OUTPATIENT_CLINIC_OR_DEPARTMENT_OTHER): Payer: 59

## 2016-10-03 DIAGNOSIS — M6281 Muscle weakness (generalized): Secondary | ICD-10-CM

## 2016-10-03 DIAGNOSIS — C9000 Multiple myeloma not having achieved remission: Secondary | ICD-10-CM

## 2016-10-03 DIAGNOSIS — R293 Abnormal posture: Secondary | ICD-10-CM

## 2016-10-03 DIAGNOSIS — M546 Pain in thoracic spine: Secondary | ICD-10-CM

## 2016-10-03 LAB — CBC WITH DIFFERENTIAL (CANCER CENTER ONLY)
BASO#: 0 10*3/uL (ref 0.0–0.2)
BASO%: 0.5 % (ref 0.0–2.0)
EOS%: 4.7 % (ref 0.0–7.0)
Eosinophils Absolute: 0.2 10*3/uL (ref 0.0–0.5)
HEMATOCRIT: 42.8 % (ref 38.7–49.9)
HGB: 14.4 g/dL (ref 13.0–17.1)
LYMPH#: 1.1 10*3/uL (ref 0.9–3.3)
LYMPH%: 25.5 % (ref 14.0–48.0)
MCH: 30.8 pg (ref 28.0–33.4)
MCHC: 33.6 g/dL (ref 32.0–35.9)
MCV: 92 fL (ref 82–98)
MONO#: 0.7 10*3/uL (ref 0.1–0.9)
MONO%: 16 % — ABNORMAL HIGH (ref 0.0–13.0)
NEUT#: 2.3 10*3/uL (ref 1.5–6.5)
NEUT%: 53.3 % (ref 40.0–80.0)
PLATELETS: 178 10*3/uL (ref 145–400)
RBC: 4.68 10*6/uL (ref 4.20–5.70)
RDW: 17.2 % — AB (ref 11.1–15.7)
WBC: 4.2 10*3/uL (ref 4.0–10.0)

## 2016-10-03 LAB — COMPREHENSIVE METABOLIC PANEL
ALT: 26 U/L (ref 0–55)
ANION GAP: 7 meq/L (ref 3–11)
AST: 28 U/L (ref 5–34)
Albumin: 4.1 g/dL (ref 3.5–5.0)
Alkaline Phosphatase: 80 U/L (ref 40–150)
BILIRUBIN TOTAL: 0.48 mg/dL (ref 0.20–1.20)
BUN: 11.9 mg/dL (ref 7.0–26.0)
CHLORIDE: 107 meq/L (ref 98–109)
CO2: 26 mEq/L (ref 22–29)
CREATININE: 0.9 mg/dL (ref 0.7–1.3)
Calcium: 9.9 mg/dL (ref 8.4–10.4)
EGFR: 90 mL/min/{1.73_m2} — ABNORMAL LOW (ref >90)
Glucose: 119 mg/dl (ref 70–140)
Potassium: 4.3 mEq/L (ref 3.5–5.1)
Sodium: 140 mEq/L (ref 136–145)
Total Protein: 6.5 g/dL (ref 6.4–8.3)

## 2016-10-03 NOTE — Therapy (Addendum)
[facility] [address]  [place], [place], [address] Phone: [phone]   Fax:  [phone]  Physical Therapy Treatment  Patient Details  Name: Jorge [name] MRN: [id number] Date of Birth: March 17, 1956 Referring Provider: Lovena Neighbours   Encounter Date: 10/03/2016      PT End of Session - 10/03/16 0931    Visit Number 4   Number of Visits 9   Date for PT Re-Evaluation 10/14/16   PT Start Time 0931   PT Stop Time 1015   PT Time Calculation (min) 44 min   Activity Tolerance Patient tolerated treatment well;No increased pain   Behavior During Therapy WFL for tasks assessed/performed      Past Medical History:  Diagnosis Date  . Anxiety   . Bone metastasis ([place])   . Chest cold 05/19/2016   productive cough  -- started on antibiotic  . Chronic back pain    due to bone mets from myeloma  . Cough   . Depression   . GERD (gastroesophageal reflux disease)   . Hiatal hernia   . History of chicken pox   . History of concussion    age 60 -- no residual  . History of DVT of lower extremity 03/21/2016  treated and completed w/ xarelto   per doppler left extensive occlusion common femoral, femoral, and popliteal veins and right partial occlusion common femoral and profunda femoral veins/  last doppler 06-04-2016 no evidence acute or chronic dvt noted either leg   . History of radiation therapy 06/09/16-06/23/16   lower thoracic spine 25 Gy in 10 fractions  . Mouth ulcers    secondary to radiation  . Multiple myeloma ([place]) dx 02/22/2016 via bone marrow bx---  oncologist-  [name]   IgG Kappa-- Hyperdiploid/ +11 w/ bone mets--  current treatment chemotherapy (started 08/ 2017)and pallitive radiation to back started 06-09-2016  . Renal calculus, right   . Wears contact lenses     Past Surgical History:  Procedure Laterality Date  . COLONOSCOPY  [id number]  . CYSTOSCOPY W/ URETERAL STENT PLACEMENT Right 06/20/2016    Procedure: CYSTOSCOPY WITH STENT REPLACEMENT;  Surgeon: Kathie Rhodes, MD;  Location: [facility];  Service: Urology;  Laterality: Right;  . CYSTOSCOPY WITH RETROGRADE PYELOGRAM, URETEROSCOPY AND STENT PLACEMENT Right 05/30/2016   Procedure: CYSTOSCOPY WITH RETROGRADE PYELOGRAM, URETEROSCOPY AND STENT PLACEMENT,DILITATION URETERAL STRICTURE;  Surgeon: Kathie Rhodes, MD;  Location: [facility];  Service: Urology;  Laterality: Right;  . CYSTOSCOPY/RETROGRADE/URETEROSCOPY/STONE EXTRACTION WITH BASKET Right 06/20/2016   Procedure: CYSTOSCOPY/URETEROSCOPY/STONE EXTRACTION WITH BASKET;  Surgeon: Kathie Rhodes, MD;  Location: [facility];  Service: Urology;  Laterality: Right;  . HOLMIUM LASER APPLICATION Right 85/0/2774   Procedure: HOLMIUM LASER APPLICATION;  Surgeon: Kathie Rhodes, MD;  Location: [facility];  Service: Urology;  Laterality: Right;  . IR GENERIC HISTORICAL  02/11/2016   IR RADIOLOGIST EVAL & MGMT 02/11/2016 [facility]  . IR GENERIC HISTORICAL  02/15/2016   IR BONE TUMOR(S)RF ABLATION 02/15/2016 Luanne Bras, MD [facility]  . IR GENERIC HISTORICAL  02/15/2016   IR BONE TUMOR(S)RF ABLATION 02/15/2016 Luanne Bras, MD [facility]  . IR GENERIC HISTORICAL  02/15/2016   IR BONE TUMOR(S)RF ABLATION 02/15/2016 Luanne Bras, MD [facility]  . IR GENERIC HISTORICAL  02/15/2016   IR KYPHO THORACIC WITH BONE BIOPSY 02/15/2016 Luanne Bras, MD [facility]  . IR GENERIC HISTORICAL  02/15/2016   IR KYPHO THORACIC WITH BONE BIOPSY  02/15/2016 Luanne Bras, MD [facility]  . IR GENERIC HISTORICAL  02/15/2016   IR VERTEBROPLASTY CERV/THOR BX INC UNI/BIL INC/INJECT/IMAGING 02/15/2016 Luanne Bras, MD [facility]  . IR GENERIC HISTORICAL  03/13/2016   IR KYPHO EA ADDL LEVEL THORACIC OR LUMBAR 03/13/2016 Luanne Bras, MD [facility]  . IR GENERIC HISTORICAL  03/13/2016   IR KYPHO EA ADDL LEVEL THORACIC OR LUMBAR 03/13/2016 Luanne Bras, MD  [facility]  . IR GENERIC HISTORICAL  03/13/2016   IR BONE TUMOR(S)RF ABLATION 03/13/2016 Luanne Bras, MD [facility]  . IR GENERIC HISTORICAL  03/13/2016   IR KYPHO LUMBAR INC FX REDUCE BONE BX UNI/BIL CANNULATION INC/IMAGING 03/13/2016 Luanne Bras, MD [facility]  . IR GENERIC HISTORICAL  03/13/2016   IR BONE TUMOR(S)RF ABLATION 03/13/2016 Luanne Bras, MD [facility]  . IR GENERIC HISTORICAL  03/13/2016   IR BONE TUMOR(S)RF ABLATION 03/13/2016 Luanne Bras, MD [facility]  . IR GENERIC HISTORICAL  03/31/2016   IR RADIOLOGIST EVAL & MGMT 03/31/2016 [facility]  . LAPAROSCOPIC INGUINAL HERNIA REPAIR Bilateral 12-16-2013  dr gross  . RADIOLOGY WITH ANESTHESIA N/A 02/15/2016   Procedure: Spinal Ablation;  Surgeon: Luanne Bras, MD;  Location: [facility];  Service: Radiology;  Laterality: N/A;  . RADIOLOGY WITH ANESTHESIA N/A 03/13/2016   Procedure: LUMBER ABLATION;  Surgeon: Luanne Bras, MD;  Location: [facility];  Service: Radiology;  Laterality: N/A;  . ROTATOR CUFF REPAIR Right 2003  . TONSILLECTOMY  age 23  . WISDOM TOOTH EXTRACTION      There were no vitals filed for this visit.      Subjective Assessment - 10/03/16 0937    Subjective Pt reporting benefit from taping last visit, just removing tape as of this morning. Pt also reporting increased muscular pain when wearing his brace and requested PT check to make sure he is wearing it correctly. Back pain was up to 5/10 last night after walking close to 2 miles yesterday.   Pertinent History multipe myeloma with multiple vertebral compression fractures with kyphplasties and vertbuloplasties.  Pt had stem cell tranplant on Feb. 9, 2018 at [facility] and reports that his bloodwork levels are OK.  He has them checked by Dr. Marin Olp Frequently.  He is referred for "outpatient physical therapy for strength and conditioning. Due to extensive vertebral involvement, and compression fractures. avoid all flexion based activity and  not lifting> 10 pounds"   Patient Stated Goals be able to strenthen  my back and reduce pain, increase walking and drive a care without pain    Currently in Pain? Yes   Pain Score 3    Pain Location Back   Pain Orientation Medial;Mid   Pain Descriptors / Indicators Aching   Pain Type Chronic pain   Pain Onset More than a month ago                         [facility] Adult PT Treatment/Exercise - 10/03/16 0931      Lumbar Exercises: Stretches   Hip Flexor Stretch 30 seconds;2 reps   Hip Flexor Stretch Limitations standing lunge position with rear knee on chair     Lumbar Exercises: Aerobic   Stationary Bike lvl 1 x 6'     Lumbar Exercises: Standing   Heel Raises 15 reps;3 seconds   Heel Raises Limitations UE support on counter   Scapular Retraction Both;10 reps;Theraband   Scapular Retraction Limitations back along pool noodle on wall   Row Both;15 reps;Theraband  Theraband Level (Row) Level 3 (Green)   Row Limitations 3" hold   Shoulder Extension Both;15 reps;Theraband   Theraband Level (Shoulder Extension) Level 3 (Green)   Shoulder Extension Limitations to neutral + scap retraction, 3" hold     Knee/Hip Exercises: Standing   Hip Abduction Both;10 reps;Knee straight   Abduction Limitations 3" hold, UE support on counter   Hip Extension Both;10 reps   Extension Limitations 3" hold, UE support on counter     Shoulder Exercises: Standing   Horizontal ABduction Strengthening;Both;10 reps;Theraband   Theraband Level (Shoulder Horizontal ABduction) Level 1 (Yellow)   Horizontal ABduction Limitations standing with back along pool noodle on wall   External Rotation Strengthening;Both;10 reps;Theraband   Theraband Level (Shoulder External Rotation) Level 1 (Yellow)   External Rotation Limitations standing with back along pool noodle on wall     Manual Therapy   Manual Therapy Soft tissue mobilization;Taping   Manual therapy comments pt seated facing back of chair    Soft tissue mobilization B paraspinals R>L   Kinesiotex Inhibit Muscle     Kinesiotix   Inhibit Muscle  2 parallel "I" strips 30% along paraspinals from lower lumbar to upper thoracic spine, peripendicular "I" strip 50% at area of greatest pain/muscle tension (lower thoracic spine)                PT Education - 10/03/16 1015    Education provided Yes   Education Details HEP update - standing hip flexor stretch, UE exercises with yellow TB   Person(s) Educated Patient   Methods Explanation;Demonstration;Handout   Comprehension Verbalized understanding;Returned demonstration;Need further instruction             PT Long Term Goals - 09/23/16 1749      PT LONG TERM GOAL #1   Title Pt will report a decrease in pain so that he is able to sit at his computer and work 30 minutes by 10/14/16   Status On-going     PT LONG TERM GOAL #2   Title Pt will increase number of repetitions of sit to stand to 14 in 30 seconds to demonstrate and increase in functional strength by 10/14/16   Status On-going     PT LONG TERM GOAL #3   Title Pt will be independent in a home program of exercise to increase back extension strength and decrease pain by 10/14/16   Status On-going     PT LONG TERM GOAL #4   Title Pt will report he can drive his car short distances without pain to increase his functional independence by 10/14/16   Status On-going           Long Term Clinic Goals - 09/16/16 1705      CC Long Term Goal  #1   Title Pt will report a decrease in pain so that he is able to sit at his computer and work 30 minutes    Time 4   Period Weeks   Status New     CC Long Term Goal  #2   Title Pt will increase number of repetitions of sit to stand to 14 in 30 seconds to demonstrate and increase in functional strength   Time 4   Period Weeks   Status New     CC Long Term Goal  #3   Title Pt will be independent in a home program of exercise to increase back extension strength and  decrease pain.   Time 4   Period Weeks  CC Long Term Goal  #4   Title Pt will report he can drive his car short distances without pain to increase his functional independence    Time 4   Period Weeks   Status New            Plan - 10/03/16 0941    Clinical Impression Statement Pt noting increased muscle soreness associated with wearing his brace with while walking. Fit and alignment of brace checked with pt and minor adjustments made with pt reporting increased comfort after adjustments. Continued strengthening focus with emphasis on extension with good pt tolerance. Pt noting benefit from kinesiotaping, therefore reapplied today.   Rehab Potential Good   Clinical Impairments Affecting Rehab Potential multiple spinal compression fractures, recovering blood parameters due to stem cell transplant    PT Treatment/Interventions ADLs/Self Care Home Management;Patient/family education;Energy conservation;Gait training;Functional mobility training;Therapeutic activities;Therapeutic exercise;Balance training;Neuromuscular re-education;Moist Heat;Electrical Stimulation;Cryotherapy;Stair training   PT Next Visit Plan MD note for appt 3/30; progress supported supine position for meeks decompression exercise, standing UE and LE extension exercise   Consulted and Agree with Plan of Care Patient      Patient will benefit from skilled therapeutic intervention in order to improve the following deficits and impairments:  Decreased endurance, Decreased knowledge of precautions, Decreased activity tolerance, Decreased strength, Pain, Decreased mobility, Postural dysfunction, Impaired perceived functional ability  Visit Diagnosis: Abnormal posture  Muscle weakness (generalized)  Pain in thoracic spine     Problem List Patient Active Problem List   Diagnosis Date Noted  . Phlebitis or thrombophlebitis of lower extremity ([place]) 07/21/2016  . Visit for preventive health examination 05/16/2016   . Aphthous ulcer 05/16/2016  . Ureterolithiasis 05/16/2016  . Acute deep vein thrombosis (DVT) of femoral vein of left lower extremity ([place]) 03/21/2016  . Multiple myeloma ([place]) 02/22/2016  . Multiple myeloma not having achieved remission ([place]) 02/22/2016  . LBP (low back pain) 11/02/2013  . Bilateral inguinal hernia (BIH), s/p lap repair 12/16/2013 11/02/2013    Percival Spanish, PT, MPT 10/03/2016, 12:31 PM  [facility] [address]  [place], [place], [address] Phone: [phone]   Fax:  [phone]  Name: Jorge [name] MRN: [id number] Date of Birth: 05-30-1956

## 2016-10-06 ENCOUNTER — Other Ambulatory Visit: Payer: 59

## 2016-10-07 ENCOUNTER — Ambulatory Visit: Payer: 59 | Admitting: Physical Therapy

## 2016-10-07 DIAGNOSIS — R293 Abnormal posture: Secondary | ICD-10-CM | POA: Diagnosis not present

## 2016-10-07 DIAGNOSIS — M546 Pain in thoracic spine: Secondary | ICD-10-CM

## 2016-10-07 DIAGNOSIS — M6281 Muscle weakness (generalized): Secondary | ICD-10-CM

## 2016-10-07 NOTE — Therapy (Addendum)
[facility] [address]  [place], [place], [address] Phone: [phone]   Fax:  [phone]  Physical Therapy Treatment  Patient Details  Name: Desiree Fleming MRN: [id number] Date of Birth: 08-Mar-1956 Referring Provider: Lovena Neighbours, ANP  Encounter Date: 10/07/2016      PT End of Session - 10/07/16 1701    Visit Number 5   Number of Visits 9   Date for PT Re-Evaluation 10/14/16   PT Start Time 1701   PT Stop Time 6468   PT Time Calculation (min) 47 min   Activity Tolerance Patient tolerated treatment well;No increased pain   Behavior During Therapy WFL for tasks assessed/performed      Past Medical History:  Diagnosis Date  . Anxiety   . Bone metastasis ([place])   . Chest cold 05/19/2016   productive cough  -- started on antibiotic  . Chronic back pain    due to bone mets from myeloma  . Cough   . Depression   . GERD (gastroesophageal reflux disease)   . Hiatal hernia   . History of chicken pox   . History of concussion    age 72 -- no residual  . History of DVT of lower extremity 03/21/2016  treated and completed w/ xarelto   per doppler left extensive occlusion common femoral, femoral, and popliteal veins and right partial occlusion common femoral and profunda femoral veins/  last doppler 06-04-2016 no evidence acute or chronic dvt noted either leg   . History of radiation therapy 06/09/16-06/23/16   lower thoracic spine 25 Gy in 10 fractions  . Mouth ulcers    secondary to radiation  . Multiple myeloma ([place]) dx 02/22/2016 via bone marrow bx---  oncologist-  [name]   IgG Kappa-- Hyperdiploid/ +11 w/ bone mets--  current treatment chemotherapy (started 08/ 2017)and pallitive radiation to back started 06-09-2016  . Renal calculus, right   . Wears contact lenses     Past Surgical History:  Procedure Laterality Date  . COLONOSCOPY  [id number]  . CYSTOSCOPY W/ URETERAL STENT PLACEMENT Right  06/20/2016   Procedure: CYSTOSCOPY WITH STENT REPLACEMENT;  Surgeon: Kathie Rhodes, MD;  Location: [facility];  Service: Urology;  Laterality: Right;  . CYSTOSCOPY WITH RETROGRADE PYELOGRAM, URETEROSCOPY AND STENT PLACEMENT Right 05/30/2016   Procedure: CYSTOSCOPY WITH RETROGRADE PYELOGRAM, URETEROSCOPY AND STENT PLACEMENT,DILITATION URETERAL STRICTURE;  Surgeon: Kathie Rhodes, MD;  Location: [facility];  Service: Urology;  Laterality: Right;  . CYSTOSCOPY/RETROGRADE/URETEROSCOPY/STONE EXTRACTION WITH BASKET Right 06/20/2016   Procedure: CYSTOSCOPY/URETEROSCOPY/STONE EXTRACTION WITH BASKET;  Surgeon: Kathie Rhodes, MD;  Location: [facility];  Service: Urology;  Laterality: Right;  . HOLMIUM LASER APPLICATION Right 09/13/1222   Procedure: HOLMIUM LASER APPLICATION;  Surgeon: Kathie Rhodes, MD;  Location: [facility];  Service: Urology;  Laterality: Right;  . IR GENERIC HISTORICAL  02/11/2016   IR RADIOLOGIST EVAL & MGMT 02/11/2016 [facility]  . IR GENERIC HISTORICAL  02/15/2016   IR BONE TUMOR(S)RF ABLATION 02/15/2016 Luanne Bras, MD [facility]  . IR GENERIC HISTORICAL  02/15/2016   IR BONE TUMOR(S)RF ABLATION 02/15/2016 Luanne Bras, MD [facility]  . IR GENERIC HISTORICAL  02/15/2016   IR BONE TUMOR(S)RF ABLATION 02/15/2016 Luanne Bras, MD [facility]  . IR GENERIC HISTORICAL  02/15/2016   IR KYPHO THORACIC WITH BONE BIOPSY 02/15/2016 Luanne Bras, MD [facility]  . IR GENERIC HISTORICAL  02/15/2016   IR KYPHO THORACIC WITH BONE BIOPSY  02/15/2016 Luanne Bras, MD [facility]  . IR GENERIC HISTORICAL  02/15/2016   IR VERTEBROPLASTY CERV/THOR BX INC UNI/BIL INC/INJECT/IMAGING 02/15/2016 Luanne Bras, MD [facility]  . IR GENERIC HISTORICAL  03/13/2016   IR KYPHO EA ADDL LEVEL THORACIC OR LUMBAR 03/13/2016 Luanne Bras, MD [facility]  . IR GENERIC HISTORICAL  03/13/2016   IR KYPHO EA ADDL LEVEL THORACIC OR LUMBAR 03/13/2016 Luanne Bras, MD [facility]  . IR GENERIC HISTORICAL  03/13/2016   IR BONE TUMOR(S)RF ABLATION 03/13/2016 Luanne Bras, MD [facility]  . IR GENERIC HISTORICAL  03/13/2016   IR KYPHO LUMBAR INC FX REDUCE BONE BX UNI/BIL CANNULATION INC/IMAGING 03/13/2016 Luanne Bras, MD [facility]  . IR GENERIC HISTORICAL  03/13/2016   IR BONE TUMOR(S)RF ABLATION 03/13/2016 Luanne Bras, MD [facility]  . IR GENERIC HISTORICAL  03/13/2016   IR BONE TUMOR(S)RF ABLATION 03/13/2016 Luanne Bras, MD [facility]  . IR GENERIC HISTORICAL  03/31/2016   IR RADIOLOGIST EVAL & MGMT 03/31/2016 [facility]  . LAPAROSCOPIC INGUINAL HERNIA REPAIR Bilateral 12-16-2013  dr gross  . RADIOLOGY WITH ANESTHESIA N/A 02/15/2016   Procedure: Spinal Ablation;  Surgeon: Luanne Bras, MD;  Location: [facility];  Service: Radiology;  Laterality: N/A;  . RADIOLOGY WITH ANESTHESIA N/A 03/13/2016   Procedure: LUMBER ABLATION;  Surgeon: Luanne Bras, MD;  Location: [facility];  Service: Radiology;  Laterality: N/A;  . ROTATOR CUFF REPAIR Right 2003  . TONSILLECTOMY  age 61  . WISDOM TOOTH EXTRACTION      There were no vitals filed for this visit.      Subjective Assessment - 10/07/16 1703    Subjective Pt feels like his muscles are getting stronger but still feels weakness when standing at a counter having to do more activities.   Pertinent History multipe myeloma with multiple vertebral compression fractures with kyphplasties and vertbuloplasties.  Pt had stem cell tranplant on Feb. 9, 2018 at [facility] and reports that his bloodwork levels are OK.  He has them checked by Dr. Marin Olp Frequently.  He is referred for "outpatient physical therapy for strength and conditioning. Due to extensive vertebral involvement, and compression fractures. avoid all flexion based activity and not lifting> 10 pounds"   How long can you sit comfortably? 15-20 minutes   How long can you stand comfortably? 20 minutes   How long can you walk  comfortably? 2 miles in brace   Patient Stated Goals be able to strenthen  my back and reduce pain, increase walking and drive a car without pain    Currently in Pain? Yes   Pain Score 2    Pain Location Back   Pain Orientation Medial;Mid   Pain Descriptors / Indicators Aching   Pain Type Chronic pain   Pain Onset More than a month ago            [facility] PT Assessment - 10/07/16 1701      Assessment   Medical Diagnosis recent stem cell transplant; multiple vertebral compression fractures    Referring Provider Lovena Neighbours, ANP   Onset Date/Surgical Date 08/22/16   Hand Dominance Right   Next MD Visit 10/10/16 - Dr. Marin Olp; 10/16/16 - [facility]     Sit to Stand   Comments 16 in 30 sec from chair with armrests (fatigue noted at end of trial)     ROM / Strength   AROM / PROM / Strength Strength     Strength   Overall Strength Comments pt able to  elicit good muscle contraction but has difficulty maintaining hold of muscle contractions against resistance   Strength Assessment Site Hip;Knee   Right/Left hand --   Right/Left Hip Right;Left   Right Hip Flexion 4-/5   Right Hip Extension 4-/5   Right Hip External Rotation  3+/5   Right Hip Internal Rotation 4-/5   Right Hip ABduction 4-/5   Right Hip ADduction 4-/5   Left Hip Flexion 4-/5   Left Hip Extension 4-/5   Left Hip External Rotation 3+/5   Left Hip Internal Rotation 4-/5   Left Hip ABduction 4-/5   Left Hip ADduction 4-/5   Right/Left Knee Right;Left   Right Knee Flexion 4-/5   Right Knee Extension 4-/5   Left Knee Flexion 4-/5   Left Knee Extension 4-/5     Transfers   Number of Reps --                     [facility] Adult PT Treatment/Exercise - 10/07/16 1701      Bed Mobility   Bed Mobility Sit to Supine;Sit to Sidelying Left;Rolling Left;Left Sidelying to Sit     Transfers   Comments Reviewed log rolling and sidelying to/from sit technique to reduce lumbar strain with bed mobility transitions.      Lumbar Exercises: Aerobic   Stationary Bike lvl 1 x 8'     Lumbar Exercises: Standing   Heel Raises 20 reps;3 seconds   Heel Raises Limitations UE support on back of chair   Functional Squats 15 reps;3 seconds   Functional Squats Limitations minisquat, UE support on back of chair     Lumbar Exercises: Supine   Glut Set 15 reps;3 seconds   Glut Set Limitations + TrA in hooklying     Lumbar Exercises: Sidelying   Clam 10 reps;3 seconds     Knee/Hip Exercises: Standing   Hip Flexion Both;10 reps;Knee straight   Hip Flexion Limitations yellow TB with 3" hold, UE support on back of chair   Hip Abduction Both;10 reps;Knee straight   Abduction Limitations yellow TB with 3" hold, UE support on back of chair   Hip Extension Both;10 reps;Knee straight   Extension Limitations yellow TB with 3" hold, UE support on back of chair     Knee/Hip Exercises: Supine   Hip Adduction Isometric Both;15 reps   Hip Adduction Isometric Limitations + TrA with 5" hold                     PT Long Term Goals - 10/07/16 1708      PT LONG TERM GOAL #1   Title Pt will report a decrease in pain so that he is able to sit at his computer and work 30 minutes by 10/14/16   Status On-going  currently tolerating 15-20 minutes in sitting     PT LONG TERM GOAL #2   Title Pt will increase number of repetitions of sit to stand to 14 in 30 seconds to demonstrate and increase in functional strength by 10/14/16   Status Achieved  pt able to complete 16 reps sit to stand in 30 sec     PT LONG TERM GOAL #3   Title Pt will be independent in a home program of exercise to increase back extension strength and decrease pain by 10/14/16   Status Partially Met  met for current HEP     PT LONG TERM GOAL #4   Title Pt will report he can  drive his car short distances without pain to increase his functional independence by 10/14/16   Status Achieved           Long Term Clinic Goals - 09/16/16 1705      [facility] Long  Term Goal  #1   Title Pt will report a decrease in pain so that he is able to sit at his computer and work 30 minutes    Time 4   Period Weeks   Status New     CC Long Term Goal  #2   Title Pt will increase number of repetitions of sit to stand to 14 in 30 seconds to demonstrate and increase in functional strength   Time 4   Period Weeks   Status New     CC Long Term Goal  #3   Title Pt will be independent in a home program of exercise to increase back extension strength and decrease pain.   Time 4   Period Weeks     CC Long Term Goal  #4   Title Pt will report he can drive his car short distances without pain to increase his functional independence    Time 4   Period Weeks   Status New            Plan - 10/07/16 1750    Clinical Impression Statement Pt noting benefit from PT with improving strength and activity tolerance, in particular tolerance for sitting and standing as well as walking up to 2 miles. MMT reveals overall LE strength ~4-/5 at hips and knees, but has difficulty maintaining a solid contraction of the muscles. Pt has good potential to continue to benefit from PT for further strengthening and postural training to improve ease of mobility and increase activity tolerance    Rehab Potential Good   Clinical Impairments Affecting Rehab Potential multiple spinal compression fractures, recovering blood parameters due to stem cell transplant    PT Treatment/Interventions ADLs/Self Care Home Management;Patient/family education;Energy conservation;Gait training;Functional mobility training;Therapeutic activities;Therapeutic exercise;Balance training;Neuromuscular re-education;Moist Heat;Electrical Stimulation;Cryotherapy;Stair training   PT Next Visit Plan progress supported supine position for meeks decompression exercise, standing UE and LE extension exercise   Consulted and Agree with Plan of Care Patient      Patient will benefit from skilled therapeutic intervention  in order to improve the following deficits and impairments:  Decreased endurance, Decreased knowledge of precautions, Decreased activity tolerance, Decreased strength, Pain, Decreased mobility, Postural dysfunction, Impaired perceived functional ability  Visit Diagnosis: Abnormal posture  Muscle weakness (generalized)  Pain in thoracic spine     Problem List Patient Active Problem List   Diagnosis Date Noted  . Phlebitis or thrombophlebitis of lower extremity ([place]) 07/21/2016  . Visit for preventive health examination 05/16/2016  . Aphthous ulcer 05/16/2016  . Ureterolithiasis 05/16/2016  . Acute deep vein thrombosis (DVT) of femoral vein of left lower extremity ([place]) 03/21/2016  . Multiple myeloma ([place]) 02/22/2016  . Multiple myeloma not having achieved remission ([place]) 02/22/2016  . LBP (low back pain) 11/02/2013  . Bilateral inguinal hernia (BIH), s/p lap repair 12/16/2013 11/02/2013    Percival Spanish, PT, MPT 10/07/2016, 7:15 PM  [facility] [address]  [place], [place], [address] Phone: [phone]   Fax:  [phone]  Name: Caroline Matters MRN: [id number] Date of Birth: Apr 14, 1956

## 2016-10-08 ENCOUNTER — Other Ambulatory Visit: Payer: 59

## 2016-10-08 DIAGNOSIS — C9000 Multiple myeloma not having achieved remission: Secondary | ICD-10-CM | POA: Diagnosis not present

## 2016-10-08 DIAGNOSIS — Z9481 Bone marrow transplant status: Secondary | ICD-10-CM | POA: Diagnosis not present

## 2016-10-08 DIAGNOSIS — Z029 Encounter for administrative examinations, unspecified: Secondary | ICD-10-CM | POA: Diagnosis not present

## 2016-10-08 DIAGNOSIS — R21 Rash and other nonspecific skin eruption: Secondary | ICD-10-CM | POA: Diagnosis not present

## 2016-10-09 ENCOUNTER — Other Ambulatory Visit: Payer: Self-pay | Admitting: *Deleted

## 2016-10-09 DIAGNOSIS — C9000 Multiple myeloma not having achieved remission: Secondary | ICD-10-CM

## 2016-10-10 ENCOUNTER — Other Ambulatory Visit: Payer: 59

## 2016-10-10 ENCOUNTER — Ambulatory Visit (HOSPITAL_BASED_OUTPATIENT_CLINIC_OR_DEPARTMENT_OTHER): Payer: 59 | Admitting: Hematology & Oncology

## 2016-10-10 ENCOUNTER — Ambulatory Visit: Payer: 59 | Admitting: Physical Therapy

## 2016-10-10 DIAGNOSIS — R293 Abnormal posture: Secondary | ICD-10-CM

## 2016-10-10 DIAGNOSIS — I341 Nonrheumatic mitral (valve) prolapse: Secondary | ICD-10-CM | POA: Diagnosis not present

## 2016-10-10 DIAGNOSIS — C9000 Multiple myeloma not having achieved remission: Secondary | ICD-10-CM

## 2016-10-10 DIAGNOSIS — D472 Monoclonal gammopathy: Secondary | ICD-10-CM

## 2016-10-10 DIAGNOSIS — M6281 Muscle weakness (generalized): Secondary | ICD-10-CM

## 2016-10-10 DIAGNOSIS — G8929 Other chronic pain: Secondary | ICD-10-CM

## 2016-10-10 DIAGNOSIS — M546 Pain in thoracic spine: Secondary | ICD-10-CM

## 2016-10-10 DIAGNOSIS — I340 Nonrheumatic mitral (valve) insufficiency: Secondary | ICD-10-CM | POA: Diagnosis not present

## 2016-10-10 DIAGNOSIS — M545 Low back pain: Secondary | ICD-10-CM

## 2016-10-10 MED ORDER — FENTANYL 50 MCG/HR TD PT72: 50.0000 ug | MEDICATED_PATCH | TRANSDERMAL | 0 refills | Status: DC

## 2016-10-10 MED ORDER — FAMCICLOVIR 500 MG PO TABS: 500.0000 mg | ORAL_TABLET | Freq: Every day | ORAL | 3 refills | Status: DC

## 2016-10-10 NOTE — Therapy (Signed)
[facility] [address]  [place], [place], [address] Phone: [phone]   Fax:  [phone]  Physical Therapy Treatment  Patient Details  Name: Besnik Febus MRN: [id number] Date of Birth: 12/08/1955 Referring Provider: Lovena Neighbours, ANP  Encounter Date: 10/10/2016      PT End of Session - 10/10/16 1022    Visit Number 6   Number of Visits 9   Date for PT Re-Evaluation 10/14/16   PT Start Time 1017   PT Stop Time 1059   PT Time Calculation (min) 42 min   Activity Tolerance Patient tolerated treatment well;No increased pain   Behavior During Therapy WFL for tasks assessed/performed      Past Medical History:  Diagnosis Date  . Anxiety   . Bone metastasis ([place])   . Chest cold 05/19/2016   productive cough  -- started on antibiotic  . Chronic back pain    due to bone mets from myeloma  . Cough   . Depression   . GERD (gastroesophageal reflux disease)   . Hiatal hernia   . History of chicken pox   . History of concussion    age 61 -- no residual  . History of DVT of lower extremity 03/21/2016  treated and completed w/ xarelto   per doppler left extensive occlusion common femoral, femoral, and popliteal veins and right partial occlusion common femoral and profunda femoral veins/  last doppler 06-04-2016 no evidence acute or chronic dvt noted either leg   . History of radiation therapy 06/09/16-06/23/16   lower thoracic spine 25 Gy in 10 fractions  . Mouth ulcers    secondary to radiation  . Multiple myeloma ([place]) dx 02/22/2016 via bone marrow bx---  oncologist-  [name]   IgG Kappa-- Hyperdiploid/ +11 w/ bone mets--  current treatment chemotherapy (started 08/ 2017)and pallitive radiation to back started 06-09-2016  . Renal calculus, right   . Wears contact lenses     Past Surgical History:  Procedure Laterality Date  . COLONOSCOPY  [id number]  . CYSTOSCOPY W/ URETERAL STENT PLACEMENT Right  06/20/2016   Procedure: CYSTOSCOPY WITH STENT REPLACEMENT;  Surgeon: Kathie Rhodes, MD;  Location: [facility];  Service: Urology;  Laterality: Right;  . CYSTOSCOPY WITH RETROGRADE PYELOGRAM, URETEROSCOPY AND STENT PLACEMENT Right 05/30/2016   Procedure: CYSTOSCOPY WITH RETROGRADE PYELOGRAM, URETEROSCOPY AND STENT PLACEMENT,DILITATION URETERAL STRICTURE;  Surgeon: Kathie Rhodes, MD;  Location: [facility];  Service: Urology;  Laterality: Right;  . CYSTOSCOPY/RETROGRADE/URETEROSCOPY/STONE EXTRACTION WITH BASKET Right 06/20/2016   Procedure: CYSTOSCOPY/URETEROSCOPY/STONE EXTRACTION WITH BASKET;  Surgeon: Kathie Rhodes, MD;  Location: [facility];  Service: Urology;  Laterality: Right;  . HOLMIUM LASER APPLICATION Right 76/02/1156   Procedure: HOLMIUM LASER APPLICATION;  Surgeon: Kathie Rhodes, MD;  Location: [facility];  Service: Urology;  Laterality: Right;  . IR GENERIC HISTORICAL  02/11/2016   IR RADIOLOGIST EVAL & MGMT 02/11/2016 [facility]  . IR GENERIC HISTORICAL  02/15/2016   IR BONE TUMOR(S)RF ABLATION 02/15/2016 Luanne Bras, MD [facility]  . IR GENERIC HISTORICAL  02/15/2016   IR BONE TUMOR(S)RF ABLATION 02/15/2016 Luanne Bras, MD [facility]  . IR GENERIC HISTORICAL  02/15/2016   IR BONE TUMOR(S)RF ABLATION 02/15/2016 Luanne Bras, MD [facility]  . IR GENERIC HISTORICAL  02/15/2016   IR KYPHO THORACIC WITH BONE BIOPSY 02/15/2016 Luanne Bras, MD [facility]  . IR GENERIC HISTORICAL  02/15/2016   IR KYPHO THORACIC WITH BONE BIOPSY  02/15/2016 Luanne Bras, MD [facility]  . IR GENERIC HISTORICAL  02/15/2016   IR VERTEBROPLASTY CERV/THOR BX INC UNI/BIL INC/INJECT/IMAGING 02/15/2016 Luanne Bras, MD [facility]  . IR GENERIC HISTORICAL  03/13/2016   IR KYPHO EA ADDL LEVEL THORACIC OR LUMBAR 03/13/2016 Luanne Bras, MD [facility]  . IR GENERIC HISTORICAL  03/13/2016   IR KYPHO EA ADDL LEVEL THORACIC OR LUMBAR 03/13/2016 Luanne Bras, MD [facility]  . IR GENERIC HISTORICAL  03/13/2016   IR BONE TUMOR(S)RF ABLATION 03/13/2016 Luanne Bras, MD [facility]  . IR GENERIC HISTORICAL  03/13/2016   IR KYPHO LUMBAR INC FX REDUCE BONE BX UNI/BIL CANNULATION INC/IMAGING 03/13/2016 Luanne Bras, MD [facility]  . IR GENERIC HISTORICAL  03/13/2016   IR BONE TUMOR(S)RF ABLATION 03/13/2016 Luanne Bras, MD [facility]  . IR GENERIC HISTORICAL  03/13/2016   IR BONE TUMOR(S)RF ABLATION 03/13/2016 Luanne Bras, MD [facility]  . IR GENERIC HISTORICAL  03/31/2016   IR RADIOLOGIST EVAL & MGMT 03/31/2016 [facility]  . LAPAROSCOPIC INGUINAL HERNIA REPAIR Bilateral 12-16-2013  dr gross  . RADIOLOGY WITH ANESTHESIA N/A 02/15/2016   Procedure: Spinal Ablation;  Surgeon: Luanne Bras, MD;  Location: [facility];  Service: Radiology;  Laterality: N/A;  . RADIOLOGY WITH ANESTHESIA N/A 03/13/2016   Procedure: LUMBER ABLATION;  Surgeon: Luanne Bras, MD;  Location: [facility];  Service: Radiology;  Laterality: N/A;  . ROTATOR CUFF REPAIR Right 2003  . TONSILLECTOMY  age 61  . WISDOM TOOTH EXTRACTION      There were no vitals filed for this visit.      Subjective Assessment - 10/10/16 1020    Subjective Pt saw Dr. Marin Olp this morning and states everything was good. Pt reporting increased abdominal oblique pain yesterday, but seems to have calmed down today.   Pertinent History multipe myeloma with multiple vertebral compression fractures with kyphplasties and vertbuloplasties.  Pt had stem cell tranplant on Feb. 9, 2018 at [facility] and reports that his bloodwork levels are OK.  He has them checked by Dr. Marin Olp Frequently.  He is referred for "outpatient physical therapy for strength and conditioning. Due to extensive vertebral involvement, and compression fractures. avoid all flexion based activity and not lifting> 10 pounds"   Patient Stated Goals be able to strenthen  my back and reduce pain, increase walking and  drive a car without pain    Currently in Pain? Yes   Pain Score 3    Pain Location Back   Pain Orientation Mid;Medial   Pain Descriptors / Indicators Aching   Pain Type Chronic pain   Pain Onset More than a month ago                         [facility] Adult PT Treatment/Exercise - 10/10/16 1017      Lumbar Exercises: Aerobic   Stationary Bike lvl 2 x 6'     Lumbar Exercises: Machines for Strengthening   Other Lumbar Machine Exercise BATCA low row 20# x10     Lumbar Exercises: Standing   Heel Raises 20 reps;3 seconds   Heel Raises Limitations UE support on counter   Functional Squats 20 reps;3 seconds   Functional Squats Limitations minisquat, UE support on counter   Scapular Retraction Both;10 reps;Theraband   Scapular Retraction Limitations back along pool noodle on wall   Other Standing Lumbar Exercises B pallof press with yellow TB x15 each     Lumbar Exercises: Supine  Glut Set 15 reps;3 seconds   Glut Set Limitations + TrA in hooklying   Other Supine Lumbar Exercises TrA + B shoulder horiz ABD with yellow TB 10x3"     Knee/Hip Exercises: Standing   Hip Flexion Both;10 reps;Knee straight   Hip Flexion Limitations yellow TB with 3" hold, UE support on back of chair   Hip ADduction Both;10 reps   Hip ADduction Limitations yellow TB with 3" hold, UE support on back of chair   Hip Abduction Both;10 reps;Knee straight   Abduction Limitations yellow TB with 3" hold, UE support on back of chair   Hip Extension Both;10 reps;Knee straight   Extension Limitations yellow TB with 3" hold, UE support on back of chair     Shoulder Exercises: Standing   Horizontal ABduction Strengthening;Both;15 reps;Theraband   Theraband Level (Shoulder Horizontal ABduction) Level 2 (Red)   Horizontal ABduction Limitations standing with back along pool noodle on wall   External Rotation Strengthening;Both;15 reps;Theraband   Theraband Level (Shoulder External Rotation) Level 1  (Yellow)   External Rotation Limitations standing with back along pool noodle on wall   Flexion Strengthening;Both;10 reps;Theraband   Theraband Level (Shoulder Flexion) Level 1 (Yellow)   Flexion Limitations + opp shoulder extension; standing with back along pool noodle on wall                     PT Long Term Goals - 10/07/16 1708      PT LONG TERM GOAL #1   Title Pt will report a decrease in pain so that he is able to sit at his computer and work 30 minutes by 10/14/16   Status On-going  currently tolerating 15-20 minutes in sitting     PT LONG TERM GOAL #2   Title Pt will increase number of repetitions of sit to stand to 14 in 30 seconds to demonstrate and increase in functional strength by 10/14/16   Status Achieved  pt able to complete 16 reps sit to stand in 30 sec     PT LONG TERM GOAL #3   Title Pt will be independent in a home program of exercise to increase back extension strength and decrease pain by 10/14/16   Status Partially Met  met for current HEP     PT LONG TERM GOAL #4   Title Pt will report he can drive his car short distances without pain to increase his functional independence by 10/14/16   Status Achieved           Long Term Clinic Goals - 09/16/16 1705      CC Long Term Goal  #1   Title Pt will report a decrease in pain so that he is able to sit at his computer and work 30 minutes    Time 4   Period Weeks   Status New     CC Long Term Goal  #2   Title Pt will increase number of repetitions of sit to stand to 14 in 30 seconds to demonstrate and increase in functional strength   Time 4   Period Weeks   Status New     CC Long Term Goal  #3   Title Pt will be independent in a home program of exercise to increase back extension strength and decrease pain.   Time 4   Period Weeks     CC Long Term Goal  #4   Title Pt will report he can drive his car short distances  without pain to increase his functional independence    Time 4   Period  Weeks   Status New            Plan - 10/10/16 1023    Clinical Impression Statement Pt reporting Dr. Marin Olp noting increased lumbar muscle mass during MD appt today. Pt feels like he has been able to increase his activity level w/o increasing his pain other than noting more soreness in abdominal obliques. Continued gentle progression of lumbar strengthening/stabilization with emphasis on extension, adding in some oblique isometric strengthening. Pt noting fatigue with most exercises requiring frequent rest breaks, but no lastin increased pain.   Rehab Potential Good   Clinical Impairments Affecting Rehab Potential multiple spinal compression fractures, recovering blood parameters due to stem cell transplant    PT Treatment/Interventions ADLs/Self Care Home Management;Patient/family education;Energy conservation;Gait training;Functional mobility training;Therapeutic activities;Therapeutic exercise;Balance training;Neuromuscular re-education;Moist Heat;Electrical Stimulation;Cryotherapy;Stair training   PT Next Visit Plan MD note for orth f/u at [facility] on 4/5; progress supported supine position for meeks decompression exercise; standing UE and LE extension exercise; kinesiotaping for back pain as indicated   Consulted and Agree with Plan of Care Patient      Patient will benefit from skilled therapeutic intervention in order to improve the following deficits and impairments:  Decreased endurance, Decreased knowledge of precautions, Decreased activity tolerance, Decreased strength, Pain, Decreased mobility, Postural dysfunction, Impaired perceived functional ability  Visit Diagnosis: Abnormal posture  Muscle weakness (generalized)  Pain in thoracic spine     Problem List Patient Active Problem List   Diagnosis Date Noted  . Phlebitis or thrombophlebitis of lower extremity ([place]) 07/21/2016  . Visit for preventive health examination 05/16/2016  . Aphthous ulcer 05/16/2016  .  Ureterolithiasis 05/16/2016  . Acute deep vein thrombosis (DVT) of femoral vein of left lower extremity ([place]) 03/21/2016  . Multiple myeloma ([place]) 02/22/2016  . Multiple myeloma not having achieved remission ([place]) 02/22/2016  . LBP (low back pain) 11/02/2013  . Bilateral inguinal hernia (BIH), s/p lap repair 12/16/2013 11/02/2013    Percival Spanish, PT, MPT 10/10/2016, 11:18 AM  [facility] [address]  [place], [place], [address] Phone: [phone]   Fax:  [phone]  Name: Quamere Mussell MRN: [id number] Date of Birth: Nov 10, 1955

## 2016-10-10 NOTE — Progress Notes (Signed)
Hematology and Oncology Follow Up Visit  Jorge [name] [id number] 06-Jun-1956 61 y.o. 10/10/2016   Principle Diagnosis:  IgG Kappa myeloma - Hyperdiploid/+11 DVT of the LEFT and RIGHT leg  Current Therapy:  S/p cycle #5 of RVD  Zometa 4 mg IV q month Xarelto 20 mg po q day - started on 03/21/2016 Palliative XRT to the back - start on 06/09/2016 Autologous stem cell transplant at [facility]-infused on 08/22/2016     Interim History:  Jorge [name] is back for follow-up. He went to [facility] for his transplant .[name] He had a tough time. Thankfully, he had no problems with infections. Apparently there was some issues with getting enough stem cells.  He has not lost anymore weight. Because of this, I think he is starting to feel better. He's not had any fever. He did have a rash. This appeared to be on sun exposed areas of his skin. He is given a Medrol Dosepak and some steroid cream. This looks a little better. He has had some pruritus with this.   He is doing physical therapy for his back. He goes back to [facility] to have his back looked that next week.   He's had no fever. He's had no problem with bowels or bladder.   There's been no leg swelling. He is on Xarelto. We may have to think about looking at another Doppler of his leg. I may want to consider low-dose Xarelto for him..  Overall, his performance status is ECOG 2.   Medications:  Current Outpatient Prescriptions:  .  calcium carbonate (TUMS - DOSED IN MG ELEMENTAL CALCIUM) 500 MG chewable tablet, Chew 1 tablet by mouth as needed for indigestion or heartburn., Disp: , Rfl:  .  carisoprodol (SOMA) 350 MG tablet, Take 1 tablet (350 mg total) by mouth 4 (four) times daily as needed for muscle spasms., Disp: 90 tablet, Rfl: 3 .  Cholecalciferol (VITAMIN D-3) 5000 units TABS, Take 5,000 mg by mouth daily., Disp: , Rfl:  .  famciclovir (FAMVIR) 500 MG tablet, Take 1 tablet (500 mg total) by mouth daily. (Patient taking differently: Take 500 mg by mouth  every morning. ), Disp: 30 tablet, Rfl: 8 .  fentaNYL (DURAGESIC - DOSED MCG/HR) 50 MCG/HR, Place 1 patch (50 mcg total) onto the skin every other day., Disp: 15 patch, Rfl: 0 .  HYDROcodone-acetaminophen (NORCO) 10-325 MG tablet, Take 0.5-1 tablets by mouth every 8 (eight) hours as needed for moderate pain., Disp: 60 tablet, Rfl: 0 .  LORazepam (ATIVAN) 0.5 MG tablet, Take 1 tablet (0.5 mg total) by mouth every 6 (six) hours as needed (Nausea or vomiting)., Disp: 30 tablet, Rfl: 0 .  magnesium oxide (MAG-OX) 400 MG tablet, Take 400 mg by mouth daily., Disp: , Rfl:  .  methylPREDNISolone (MEDROL DOSEPAK) 4 MG TBPK tablet, Take 4 mg by mouth. Per package instructions, Disp: , Rfl:  .  mometasone (ELOCON) 0.1 % cream, Apply topically daily., Disp: , Rfl:  .  ondansetron (ZOFRAN-ODT) 8 MG disintegrating tablet, Take 1 tablet (8 mg total) by mouth every 8 (eight) hours as needed., Disp: 60 tablet, Rfl: 1 .  Oxycodone HCl 10 MG TABS, Take 1 tablet (10 mg total) by mouth every 4 (four) hours as needed., Disp: 30 tablet, Rfl: 0 .  polyethylene glycol powder (MIRALAX) powder, Take 17 g by mouth daily., Disp: 850 g, Rfl: 3 .  Probiotic Product (PROBIOTIC PO), Take 1 capsule by mouth daily., Disp: , Rfl:  .  prochlorperazine (COMPAZINE) 10  MG tablet, Take 1 tablet (10 mg total) by mouth every 6 (six) hours as needed for nausea or vomiting., Disp: 30 tablet, Rfl: 3 .  ranitidine (ZANTAC) 150 MG tablet, Take 150 mg by mouth as needed for heartburn., Disp: , Rfl:  .  rivaroxaban (XARELTO) 20 MG TABS tablet, Take 1 tablet (20 mg total) by mouth daily with supper., Disp: 30 tablet, Rfl: 4 .  senna (SENOKOT) 8.6 MG TABS tablet, Take 2 tablets (17.2 mg total) by mouth daily., Disp: 60 each, Rfl: 2 .  sertraline (ZOLOFT) 100 MG tablet, Take 100 mg by mouth at bedtime. , Disp: , Rfl: 2 .  zolpidem (AMBIEN) 10 MG tablet, Take 1 tablet (10 mg total) by mouth at bedtime as needed. (Patient taking differently: Take 5 mg by  mouth at bedtime. ), Disp: 30 tablet, Rfl: 3 .  cyclobenzaprine (FLEXERIL) 10 MG tablet, Take 10 mg by mouth as needed., Disp: , Rfl: 0  Allergies: No Known Allergies  Past Medical History, Surgical history, Social history, and Family History were reviewed and updated.  Review of Systems: As above  Physical Exam:  weight is 158 lb (71.7 kg). His oral temperature is 97.8 F (36.6 C). His blood pressure is 110/66 and his pulse is 80. His respiration is 16 and oxygen saturation is 95%.   Wt Readings from Last 3 Encounters:  10/10/16 158 lb (71.7 kg)  09/19/16 158 lb (71.7 kg)  07/28/16 173 lb 12.8 oz (78.8 kg)     Thin white male. He is in no distress. Head and neck exam shows no ocular or oral lesions. He does have some temporal muscle wasting. He has no palpable cervical or supraclavicular lymph nodes. He has no palpable thyroid. Lungs are clear bilaterally. Cardiac exam regular rate and rhythm with no murmurs, rubs or bruits. Abdomen is soft. He has good bowel sounds. There is no fluid wave. There is no palpable liver or spleen tip. Back exam shows some tenderness in the midthoracic spine. There is some tenderness along the right thoracic paravertebral muscles. Extremities shows no clubbing, cyanosis or edema. He has good range of motion of his joints. He has good strength in his extremities. Neurological exam shows no focal neurological deficits. Skin exam shows no rashes, ecchymoses or petechia.   Lab Results  Component Value Date   WBC 4.2 10/03/2016   HGB 14.4 10/03/2016   HCT 42.8 10/03/2016   MCV 92 10/03/2016   PLT 178 10/03/2016     Chemistry      Component Value Date/Time   NA 140 10/03/2016 0845   K 4.3 10/03/2016 0845   CL 103 09/15/2016 1105   CO2 26 10/03/2016 0845   BUN 11.9 10/03/2016 0845   CREATININE 0.9 10/03/2016 0845      Component Value Date/Time   CALCIUM 9.9 10/03/2016 0845   ALKPHOS 80 10/03/2016 0845   AST 28 10/03/2016 0845   ALT 26 10/03/2016  0845   BILITOT 0.48 10/03/2016 0845         Impression and Plan: Jorge [name] is a 61 year old white male. He actually looks to be in good shape. He has IgG Kappa Myeloma.  He had his transplant at [facility] back in February. He is looking good. He is lost quite a bit overweight. It may take a while for the weight to go back on to him.   He is looking better. He is gaining weight. He is doing physical therapy. I'm glad all this is helping  him.  When he goes back to see Dr. Laverta Baltimore in 6 weeks, then they will decide when to start him back on therapy.  I will start him on Zometa when I see him back in one month.    [name], MD 3/30/20189:33 AM

## 2016-10-13 ENCOUNTER — Other Ambulatory Visit: Payer: 59

## 2016-10-14 ENCOUNTER — Ambulatory Visit: Payer: 59 | Attending: Internal Medicine | Admitting: Physical Therapy

## 2016-10-14 DIAGNOSIS — R293 Abnormal posture: Secondary | ICD-10-CM | POA: Diagnosis not present

## 2016-10-14 DIAGNOSIS — M6281 Muscle weakness (generalized): Secondary | ICD-10-CM

## 2016-10-14 DIAGNOSIS — M546 Pain in thoracic spine: Secondary | ICD-10-CM | POA: Diagnosis not present

## 2016-10-14 NOTE — Therapy (Signed)
[facility] [address]  [place], [place], [address] Phone: [phone]   Fax:  [phone]  Physical Therapy Treatment  Patient Details  Name: Jorge [name] MRN: [id number] Date of Birth: 1956-03-29 Referring Provider: Lovena Neighbours, ANP  Encounter Date: 10/14/2016      PT End of Session - 10/14/16 1701    Visit Number 7   Number of Visits 21   Date for PT Re-Evaluation 11/28/16   PT Start Time 1701   PT Stop Time 1594   PT Time Calculation (min) 47 min   Activity Tolerance Patient tolerated treatment well;No increased pain   Behavior During Therapy WFL for tasks assessed/performed      Past Medical History:  Diagnosis Date  . Anxiety   . Bone metastasis ([place])   . Chest cold 05/19/2016   productive cough  -- started on antibiotic  . Chronic back pain    due to bone mets from myeloma  . Cough   . Depression   . GERD (gastroesophageal reflux disease)   . Hiatal hernia   . History of chicken pox   . History of concussion    age 61 -- no residual  . History of DVT of lower extremity 03/21/2016  treated and completed w/ xarelto   per doppler left extensive occlusion common femoral, femoral, and popliteal veins and right partial occlusion common femoral and profunda femoral veins/  last doppler 06-04-2016 no evidence acute or chronic dvt noted either leg   . History of radiation therapy 06/09/16-06/23/16   lower thoracic spine 25 Gy in 10 fractions  . Mouth ulcers    secondary to radiation  . Multiple myeloma ([place]) dx 02/22/2016 via bone marrow bx---  oncologist-  [name]   IgG Kappa-- Hyperdiploid/ +11 w/ bone mets--  current treatment chemotherapy (started 08/ 2017)and pallitive radiation to back started 06-09-2016  . Renal calculus, right   . Wears contact lenses     Past Surgical History:  Procedure Laterality Date  . COLONOSCOPY  [id number]  . CYSTOSCOPY W/ URETERAL STENT PLACEMENT Right  06/20/2016   Procedure: CYSTOSCOPY WITH STENT REPLACEMENT;  Surgeon: Kathie Rhodes, MD;  Location: [facility];  Service: Urology;  Laterality: Right;  . CYSTOSCOPY WITH RETROGRADE PYELOGRAM, URETEROSCOPY AND STENT PLACEMENT Right 05/30/2016   Procedure: CYSTOSCOPY WITH RETROGRADE PYELOGRAM, URETEROSCOPY AND STENT PLACEMENT,DILITATION URETERAL STRICTURE;  Surgeon: Kathie Rhodes, MD;  Location: [facility];  Service: Urology;  Laterality: Right;  . CYSTOSCOPY/RETROGRADE/URETEROSCOPY/STONE EXTRACTION WITH BASKET Right 06/20/2016   Procedure: CYSTOSCOPY/URETEROSCOPY/STONE EXTRACTION WITH BASKET;  Surgeon: Kathie Rhodes, MD;  Location: [facility];  Service: Urology;  Laterality: Right;  . HOLMIUM LASER APPLICATION Right 58/11/9290   Procedure: HOLMIUM LASER APPLICATION;  Surgeon: Kathie Rhodes, MD;  Location: [facility];  Service: Urology;  Laterality: Right;  . IR GENERIC HISTORICAL  02/11/2016   IR RADIOLOGIST EVAL & MGMT 02/11/2016 [facility]  . IR GENERIC HISTORICAL  02/15/2016   IR BONE TUMOR(S)RF ABLATION 02/15/2016 Luanne Bras, MD [facility]  . IR GENERIC HISTORICAL  02/15/2016   IR BONE TUMOR(S)RF ABLATION 02/15/2016 Luanne Bras, MD [facility]  . IR GENERIC HISTORICAL  02/15/2016   IR BONE TUMOR(S)RF ABLATION 02/15/2016 Luanne Bras, MD [facility]  . IR GENERIC HISTORICAL  02/15/2016   IR KYPHO THORACIC WITH BONE BIOPSY 02/15/2016 Luanne Bras, MD [facility]  . IR GENERIC HISTORICAL  02/15/2016   IR KYPHO THORACIC WITH BONE BIOPSY  02/15/2016 Luanne Bras, MD [facility]  . IR GENERIC HISTORICAL  02/15/2016   IR VERTEBROPLASTY CERV/THOR BX INC UNI/BIL INC/INJECT/IMAGING 02/15/2016 Luanne Bras, MD [facility]  . IR GENERIC HISTORICAL  03/13/2016   IR KYPHO EA ADDL LEVEL THORACIC OR LUMBAR 03/13/2016 Luanne Bras, MD [facility]  . IR GENERIC HISTORICAL  03/13/2016   IR KYPHO EA ADDL LEVEL THORACIC OR LUMBAR 03/13/2016 Luanne Bras, MD [facility]  . IR GENERIC HISTORICAL  03/13/2016   IR BONE TUMOR(S)RF ABLATION 03/13/2016 Luanne Bras, MD [facility]  . IR GENERIC HISTORICAL  03/13/2016   IR KYPHO LUMBAR INC FX REDUCE BONE BX UNI/BIL CANNULATION INC/IMAGING 03/13/2016 Luanne Bras, MD [facility]  . IR GENERIC HISTORICAL  03/13/2016   IR BONE TUMOR(S)RF ABLATION 03/13/2016 Luanne Bras, MD [facility]  . IR GENERIC HISTORICAL  03/13/2016   IR BONE TUMOR(S)RF ABLATION 03/13/2016 Luanne Bras, MD [facility]  . IR GENERIC HISTORICAL  03/31/2016   IR RADIOLOGIST EVAL & MGMT 03/31/2016 [facility]  . LAPAROSCOPIC INGUINAL HERNIA REPAIR Bilateral 12-16-2013  dr gross  . RADIOLOGY WITH ANESTHESIA N/A 02/15/2016   Procedure: Spinal Ablation;  Surgeon: Luanne Bras, MD;  Location: [facility];  Service: Radiology;  Laterality: N/A;  . RADIOLOGY WITH ANESTHESIA N/A 03/13/2016   Procedure: LUMBER ABLATION;  Surgeon: Luanne Bras, MD;  Location: [facility];  Service: Radiology;  Laterality: N/A;  . ROTATOR CUFF REPAIR Right 2003  . TONSILLECTOMY  age 61  . WISDOM TOOTH EXTRACTION      There were no vitals filed for this visit.      Subjective Assessment - 10/14/16 1704    Subjective Pt report pain no more than a "dull ache" at present. States he tried walking w/o the brace and was able to go 1 mile with no more pain than when he wears the brace.   Pertinent History multipe myeloma with multiple vertebral compression fractures with kyphplasties and vertbuloplasties.  Pt had stem cell tranplant on Feb. 9, 2018 at [facility] and reports that his bloodwork levels are OK.  He has them checked by Dr. Marin Olp Frequently.  He is referred for "outpatient physical therapy for strength and conditioning. Due to extensive vertebral involvement, and compression fractures. avoid all flexion based activity and not lifting> 10 pounds"   Patient Stated Goals be able to strenthen  my back and reduce pain, increase walking  and drive a car without pain    Currently in Pain? Yes   Pain Score --  2-3/10   Pain Location Back   Pain Orientation Mid;Medial   Pain Descriptors / Indicators Dull;Aching   Pain Type Chronic pain   Pain Radiating Towards intermittently wraps to lateral ribs, but no longer reaching the front of the ribcage   Pain Onset More than a month ago   Pain Frequency Constant            [facility] PT Assessment - 10/14/16 1701      Assessment   Medical Diagnosis recent stem cell transplant; multiple vertebral compression fractures    Referring Provider Lovena Neighbours, ANP   Onset Date/Surgical Date 08/22/16   Hand Dominance Right   Next MD Visit 10/16/16 - [facility]; 11/07/16 - Dr. 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     Strength  Overall Strength Comments pt able to elicit good muscle contraction but has difficulty maintaining hold of muscle contractions against resistance   Right Hip Flexion 4-/5   Right Hip Extension 4-/5   Right Hip External Rotation  3+/5   Right Hip Internal Rotation 4-/5   Right Hip ABduction 4-/5   Right Hip ADduction 4-/5   Left Hip Flexion 4-/5   Left Hip Extension 4-/5   Left Hip External Rotation 3+/5   Left Hip Internal Rotation 4-/5   Left Hip ABduction 4-/5   Left Hip ADduction 4-/5   Right Knee Flexion 4-/5   Right Knee Extension 4-/5   Left Knee Flexion 4-/5   Left Knee Extension 4-/5                     [facility] Adult PT Treatment/Exercise - 10/14/16 1701      Self-Care   Self-Care Posture   Posture Provided education in neutral spine posture & body mechanics for daily tasks and chores, with handout provided.     Lumbar Exercises: Aerobic   Stationary Bike lvl 2 x 6'     Lumbar Exercises: Standing   Functional Squats 20 reps;3 seconds   Functional Squats Limitations 1/3-1/2  squat on TRX   Wall Slides 15 reps;3 seconds   Wall Slides Limitations on orange Pball on wall   Scapular Retraction Both;15 reps   Scapular Retraction Limitations back along pool noodle on wall   Other Standing Lumbar Exercises B pallof press with red TB x15 each     Lumbar Exercises: Seated   Other Seated Lumbar Exercises B row & shoulder extension with green TB seated on green (65 cm) Pball 20x3" each; cues to maintain neutral pelvis position     Shoulder Exercises: Standing   Horizontal ABduction Strengthening;Both;20 reps;Theraband   Theraband Level (Shoulder Horizontal ABduction) Level 2 (Red)   Horizontal ABduction Limitations standing with back along pool noodle on wall   External Rotation Strengthening;Both;15 reps;Theraband   Theraband Level (Shoulder External Rotation) Level 2 (Red)   External Rotation Limitations standing with back along pool noodle on wall   Flexion Strengthening;Both;12 reps;Theraband   Theraband Level (Shoulder Flexion) Level 1 (Yellow)   Flexion Limitations + opp shoulder extension; standing with back along pool noodle on wall     Manual Therapy   Manual Therapy Taping   Kinesiotex Inhibit Muscle     Kinesiotix   Inhibit Muscle  2 parallel "I" strips 30% along paraspinals from lower lumbar to upper thoracic spine, peripendicular "I" strip 50% at area of greatest pain/muscle tension (lower thoracic spine at distal angle of scapula)                     PT Long Term Goals - 10/14/16 1709      PT LONG TERM GOAL #1   Title Pt will report a decrease in pain so that he is able to sit at his computer and work 30 minutes by 10/14/16   Status Achieved  currently tolerating up to 30 minutes in sitting     PT LONG TERM GOAL #2   Title Pt will increase number of repetitions of sit to stand to 14 in 30 seconds to demonstrate and increase in functional strength by 10/14/16   Status Achieved  pt able to complete 16 reps sit to stand in 30 sec     PT  LONG TERM GOAL #3   Title Pt will be independent in  a home program of exercise to increase back extension strength and decrease pain by 11/28/16   Status Revised  met for current HEP     PT LONG TERM GOAL #4   Title Pt will report he can drive his car short distances without pain to increase his functional independence by 10/14/16   Status Achieved     PT LONG TERM GOAL #5   Title Pt will report he can drive his car for 78-46 minutes without limitation due to pain to increase his functional independence by 11/28/16   Status New     PT LONG TERM GOAL #6   Title Pt will report no increase in pain with transitions in/out car by 11/28/16   Status New     PT LONG TERM GOAL #7   Title Pt will be able to walk 5K w/o brace or limitation due to back pain by 11/28/16   Status New     PT LONG TERM GOAL #8   Title Pt will tolerate standing in the kitchen for >/= 45 minutes w/o limitation due to back pain to allow for meal prep by 11/28/16             Plan - 10/14/16 1701    Clinical Impression Statement Pt noting benefit from PT with improving strength and activity tolerance, in particular tolerance for sitting and standing as well as walking up to 2 miles. MMT reveals overall LE strength ~4-/5 at hips and knees, but has difficulty maintaining a solid contraction of the muscles during MMT. All initial goals met but pt would like to continue strengthening with hopes of weaning from TLSO for walking. Pt demonstrates good potential to continue to build on current level of function to improve ability to complete transitions w/o increased back pain, as well as improve standing, walking and driving tolerance. Recommend recert for continued PT 2x/wk for additional 4-6 weeks.   Rehab Potential Good   Clinical Impairments Affecting Rehab Potential multiple spinal compression fractures, recovering blood parameters due to stem cell transplant    PT Frequency 2x / week   PT Duration 6 weeks   PT  Treatment/Interventions ADLs/Self Care Home Management;Patient/family education;Energy conservation;Gait training;Functional mobility training;Therapeutic activities;Therapeutic exercise;Balance training;Neuromuscular re-education;Moist Heat;Electrical Stimulation;Cryotherapy;Stair training   PT Next Visit Plan progress supported supine position for meeks decompression exercise; lumbar strengthening/stabilization with extension preference; kinesiotaping for back pain as indicated   Consulted and Agree with Plan of Care Patient      Patient will benefit from skilled therapeutic intervention in order to improve the following deficits and impairments:  Decreased endurance, Decreased knowledge of precautions, Decreased activity tolerance, Decreased strength, Pain, Decreased mobility, Postural dysfunction, Impaired perceived functional ability  Visit Diagnosis: Abnormal posture  Muscle weakness (generalized)  Pain in thoracic spine     Problem List Patient Active Problem List   Diagnosis Date Noted  . Phlebitis or thrombophlebitis of lower extremity ([place]) 07/21/2016  . Visit for preventive health examination 05/16/2016  . Aphthous ulcer 05/16/2016  . Ureterolithiasis 05/16/2016  . Acute deep vein thrombosis (DVT) of femoral vein of left lower extremity ([place]) 03/21/2016  . Multiple myeloma ([place]) 02/22/2016  . Multiple myeloma not having achieved remission ([place]) 02/22/2016  . LBP (low back pain) 11/02/2013  . Bilateral inguinal hernia (BIH), s/p lap repair 12/16/2013 11/02/2013    Percival Spanish, PT, MPT 10/14/2016, 6:22 PM  [facility] [place], [place], [address] Phone:  [phone]   Fax:  [phone]  Name: Marcy Bogosian MRN: [id number] Date of Birth: 01-21-1956

## 2016-10-14 NOTE — Patient Instructions (Signed)

## 2016-10-15 ENCOUNTER — Other Ambulatory Visit: Payer: 59

## 2016-10-16 DIAGNOSIS — C9000 Multiple myeloma not having achieved remission: Secondary | ICD-10-CM | POA: Diagnosis not present

## 2016-10-16 DIAGNOSIS — M4850XG Collapsed vertebra, not elsewhere classified, site unspecified, subsequent encounter for fracture with delayed healing: Secondary | ICD-10-CM | POA: Diagnosis not present

## 2016-10-16 DIAGNOSIS — C7951 Secondary malignant neoplasm of bone: Secondary | ICD-10-CM | POA: Diagnosis not present

## 2016-10-16 DIAGNOSIS — K449 Diaphragmatic hernia without obstruction or gangrene: Secondary | ICD-10-CM | POA: Diagnosis not present

## 2016-10-17 ENCOUNTER — Other Ambulatory Visit: Payer: 59

## 2016-10-17 ENCOUNTER — Ambulatory Visit: Payer: 59 | Admitting: Physical Therapy

## 2016-10-17 DIAGNOSIS — R293 Abnormal posture: Secondary | ICD-10-CM

## 2016-10-17 DIAGNOSIS — M6281 Muscle weakness (generalized): Secondary | ICD-10-CM

## 2016-10-17 NOTE — Therapy (Signed)
[facility] [address]  [place], [place], [address] Phone: [phone]   Fax:  [phone]  Physical Therapy Treatment  Patient Details  Name: Jorge [name] MRN: [id number] Date of Birth: 1955-12-18 Referring Provider: Lovena Neighbours, ANP  Encounter Date: 10/17/2016      PT End of Session - 10/17/16 0936    Visit Number 8   Number of Visits 21   Date for PT Re-Evaluation 11/28/16   PT Start Time 0936   PT Stop Time 1017   PT Time Calculation (min) 41 min   Activity Tolerance Patient tolerated treatment well;No increased pain   Behavior During Therapy WFL for tasks assessed/performed      Past Medical History:  Diagnosis Date  . Anxiety   . Bone metastasis ([place])   . Chest cold 05/19/2016   productive cough  -- started on antibiotic  . Chronic back pain    due to bone mets from myeloma  . Cough   . Depression   . GERD (gastroesophageal reflux disease)   . Hiatal hernia   . History of chicken pox   . History of concussion    age 43 -- no residual  . History of DVT of lower extremity 03/21/2016  treated and completed w/ xarelto   per doppler left extensive occlusion common femoral, femoral, and popliteal veins and right partial occlusion common femoral and profunda femoral veins/  last doppler 06-04-2016 no evidence acute or chronic dvt noted either leg   . History of radiation therapy 06/09/16-06/23/16   lower thoracic spine 25 Gy in 10 fractions  . Mouth ulcers    secondary to radiation  . Multiple myeloma ([place]) dx 02/22/2016 via bone marrow bx---  oncologist-  [name]   IgG Kappa-- Hyperdiploid/ +11 w/ bone mets--  current treatment chemotherapy (started 08/ 2017)and pallitive radiation to back started 06-09-2016  . Renal calculus, right   . Wears contact lenses     Past Surgical History:  Procedure Laterality Date  . COLONOSCOPY  [id number]  . CYSTOSCOPY W/ URETERAL STENT PLACEMENT Right  06/20/2016   Procedure: CYSTOSCOPY WITH STENT REPLACEMENT;  Surgeon: Kathie Rhodes, MD;  Location: [facility];  Service: Urology;  Laterality: Right;  . CYSTOSCOPY WITH RETROGRADE PYELOGRAM, URETEROSCOPY AND STENT PLACEMENT Right 05/30/2016   Procedure: CYSTOSCOPY WITH RETROGRADE PYELOGRAM, URETEROSCOPY AND STENT PLACEMENT,DILITATION URETERAL STRICTURE;  Surgeon: Kathie Rhodes, MD;  Location: [facility];  Service: Urology;  Laterality: Right;  . CYSTOSCOPY/RETROGRADE/URETEROSCOPY/STONE EXTRACTION WITH BASKET Right 06/20/2016   Procedure: CYSTOSCOPY/URETEROSCOPY/STONE EXTRACTION WITH BASKET;  Surgeon: Kathie Rhodes, MD;  Location: [facility];  Service: Urology;  Laterality: Right;  . HOLMIUM LASER APPLICATION Right 68/0/8811   Procedure: HOLMIUM LASER APPLICATION;  Surgeon: Kathie Rhodes, MD;  Location: [facility];  Service: Urology;  Laterality: Right;  . IR GENERIC HISTORICAL  02/11/2016   IR RADIOLOGIST EVAL & MGMT 02/11/2016 [facility]  . IR GENERIC HISTORICAL  02/15/2016   IR BONE TUMOR(S)RF ABLATION 02/15/2016 Luanne Bras, MD [facility]  . IR GENERIC HISTORICAL  02/15/2016   IR BONE TUMOR(S)RF ABLATION 02/15/2016 Luanne Bras, MD [facility]  . IR GENERIC HISTORICAL  02/15/2016   IR BONE TUMOR(S)RF ABLATION 02/15/2016 Luanne Bras, MD [facility]  . IR GENERIC HISTORICAL  02/15/2016   IR KYPHO THORACIC WITH BONE BIOPSY 02/15/2016 Luanne Bras, MD [facility]  . IR GENERIC HISTORICAL  02/15/2016   IR KYPHO THORACIC WITH BONE BIOPSY  02/15/2016 Luanne Bras, MD [facility]  . IR GENERIC HISTORICAL  02/15/2016   IR VERTEBROPLASTY CERV/THOR BX INC UNI/BIL INC/INJECT/IMAGING 02/15/2016 Luanne Bras, MD [facility]  . IR GENERIC HISTORICAL  03/13/2016   IR KYPHO EA ADDL LEVEL THORACIC OR LUMBAR 03/13/2016 Luanne Bras, MD [facility]  . IR GENERIC HISTORICAL  03/13/2016   IR KYPHO EA ADDL LEVEL THORACIC OR LUMBAR 03/13/2016 Luanne Bras, MD [facility]  . IR GENERIC HISTORICAL  03/13/2016   IR BONE TUMOR(S)RF ABLATION 03/13/2016 Luanne Bras, MD [facility]  . IR GENERIC HISTORICAL  03/13/2016   IR KYPHO LUMBAR INC FX REDUCE BONE BX UNI/BIL CANNULATION INC/IMAGING 03/13/2016 Luanne Bras, MD [facility]  . IR GENERIC HISTORICAL  03/13/2016   IR BONE TUMOR(S)RF ABLATION 03/13/2016 Luanne Bras, MD [facility]  . IR GENERIC HISTORICAL  03/13/2016   IR BONE TUMOR(S)RF ABLATION 03/13/2016 Luanne Bras, MD [facility]  . IR GENERIC HISTORICAL  03/31/2016   IR RADIOLOGIST EVAL & MGMT 03/31/2016 [facility]  . LAPAROSCOPIC INGUINAL HERNIA REPAIR Bilateral 12-16-2013  dr gross  . RADIOLOGY WITH ANESTHESIA N/A 02/15/2016   Procedure: Spinal Ablation;  Surgeon: Luanne Bras, MD;  Location: [facility];  Service: Radiology;  Laterality: N/A;  . RADIOLOGY WITH ANESTHESIA N/A 03/13/2016   Procedure: LUMBER ABLATION;  Surgeon: Luanne Bras, MD;  Location: [facility];  Service: Radiology;  Laterality: N/A;  . ROTATOR CUFF REPAIR Right 2003  . TONSILLECTOMY  age 5  . WISDOM TOOTH EXTRACTION      There were no vitals filed for this visit.      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 MD did x-ray to try to determine why pain remains elevated in mid back and has MRI scheduled. MD instructed pt to continue wearing brace as needed and wean as tolerated.   Pertinent History multipe myeloma with multiple vertebral compression fractures with kyphplasties and vertbuloplasties.  Pt had stem cell tranplant on Feb. 9, 2018 at [facility] and reports that his bloodwork levels are OK.  He has them checked by Dr. Marin Olp Frequently.  He is referred for "outpatient physical therapy for strength and conditioning. Due to extensive vertebral involvement, and compression fractures. avoid all flexion based activity and not  lifting> 10 pounds"   Patient Stated Goals be able to strenthen  my back and reduce pain, increase walking and drive a car without pain    Currently in Pain? Yes   Pain Score 3    Pain Location Back   Pain Orientation Mid;Medial   Pain Descriptors / Indicators Dull;Aching   Pain Type Chronic pain   Pain Onset More than a month ago                         [facility] Adult PT Treatment/Exercise - 10/17/16 0936      Lumbar Exercises: Aerobic   Stationary Bike lvl 2 x 6'     Lumbar Exercises: Supine   Glut Set 10 reps;3 seconds   Glut Set Limitations + TrA in hooklying   Clam 10 reps;3 seconds   Clam Limitations alt bent-knee fall-out - 2# cuff wt at knee   Dead Bug 10 reps;2 seconds   Bridge 10 reps;3 seconds   Bridge Limitations limited lift   Other Supine Lumbar Exercises Meeks decompression excercise x 5', B shoudler press 20x3",  Head press 20x3", Unilateral leg press 20x3" each     Lumbar Exercises: Sidelying   Clam 3 seconds;15 reps  bilateral   Clam Limitations 2# cuff wt at knee     Lumbar Exercises: Quadruped   Single Arm Raise Right;Left;10 reps;2 seconds   Single Arm Raises Limitations torso supported on peanut ball   Straight Leg Raise 10 reps;2 seconds   Straight Leg Raises Limitations torso supported on peanut ball     Manual Therapy   Manual Therapy Taping   Kinesiotex Inhibit Muscle     Kinesiotix   Inhibit Muscle  2 parallel "I" strips 30% along paraspinals from lower lumbar to upper thoracic spine, peripendicular "I" strip 50% at area of greatest pain/muscle tension (lower thoracic spine at distal angle of scapula)                PT Education - 10/17/16 1022    Education provided Yes   Education Details HEP update - dead bug, bent-knee fall out & sidelying clams   Person(s) Educated Patient   Methods Explanation;Demonstration;Handout   Comprehension Verbalized understanding;Returned demonstration             PT Long Term  Goals - 10/14/16 1709      PT LONG TERM GOAL #1   Title Pt will report a decrease in pain so that he is able to sit at his computer and work 30 minutes by 10/14/16   Status Achieved  currently tolerating up to 30 minutes in sitting     PT LONG TERM GOAL #2   Title Pt will increase number of repetitions of sit to stand to 14 in 30 seconds to demonstrate and increase in functional strength by 10/14/16   Status Achieved  pt able to complete 16 reps sit to stand in 30 sec     PT LONG TERM GOAL #3   Title Pt will be independent in a home program of exercise to increase back extension strength and decrease pain by 11/28/16   Status Revised  met for current HEP     PT LONG TERM GOAL #4   Title Pt will report he can drive his car short distances without pain to increase his functional independence by 10/14/16   Status Achieved     PT LONG TERM GOAL #5   Title Pt will report he can drive his car for 16-60 minutes without limitation due to pain to increase his functional independence by 11/28/16   Status New     PT LONG TERM GOAL #6   Title Pt will report no increase in pain with transitions in/out car by 11/28/16   Status New     PT LONG TERM GOAL #7   Title Pt will be able to walk 5K w/o brace or limitation due to back pain by 11/28/16   Status New     PT LONG TERM GOAL #8   Title Pt will tolerate standing in the kitchen for >/= 45 minutes w/o limitation due to back pain to allow for meal prep by 11/28/16           Long Term Clinic Goals - 09/16/16 1705      CC Long Term Goal  #1   Title Pt will report a decrease in pain so that he is able to sit at his computer and work 30 minutes    Time 4   Period Weeks   Status New     CC Long Term Goal  #  2   Title Pt will increase number of repetitions of sit to stand to 14 in 30 seconds to demonstrate and increase in functional strength   Time 4   Period Weeks   Status New     CC Long Term Goal  #3   Title Pt will be independent in a home  program of exercise to increase back extension strength and decrease pain.   Time 4   Period Weeks     CC Long Term Goal  #4   Title Pt will report he can drive his car short distances without pain to increase his functional independence    Time 4   Period Weeks   Status New            Plan - 10/17/16 1017    Clinical Impression Statement Pt reports MD looking into why pain remains most intense in mid back, taking x-rays yesterday and has MRI scheduled, but results not yet known. Pt demonstrating improving tolerance for supine position and able to progress exercises in this position. Introduced quadruped extension with torso supported on peanut ball - increased fatigue with these exercises, but pt reporting back felt better upon return to standing.   Rehab Potential Good   Clinical Impairments Affecting Rehab Potential multiple spinal compression fractures, recovering blood parameters due to stem cell transplant    PT Frequency 2x / week   PT Duration 6 weeks   PT Treatment/Interventions ADLs/Self Care Home Management;Patient/family education;Energy conservation;Gait training;Functional mobility training;Therapeutic activities;Therapeutic exercise;Balance training;Neuromuscular re-education;Moist Heat;Electrical Stimulation;Cryotherapy;Stair training   PT Next Visit Plan progress supported supine position for meeks decompression exercise; lumbar strengthening/stabilization with extension preference; kinesiotaping for back pain as indicated   Consulted and Agree with Plan of Care Patient      Patient will benefit from skilled therapeutic intervention in order to improve the following deficits and impairments:  Decreased endurance, Decreased knowledge of precautions, Decreased activity tolerance, Decreased strength, Pain, Decreased mobility, Postural dysfunction, Impaired perceived functional ability  Visit Diagnosis: Abnormal posture  Muscle weakness (generalized)     Problem  List Patient Active Problem List   Diagnosis Date Noted  . Phlebitis or thrombophlebitis of lower extremity ([place]) 07/21/2016  . Visit for preventive health examination 05/16/2016  . Aphthous ulcer 05/16/2016  . Ureterolithiasis 05/16/2016  . Acute deep vein thrombosis (DVT) of femoral vein of left lower extremity ([place]) 03/21/2016  . Multiple myeloma ([place]) 02/22/2016  . Multiple myeloma not having achieved remission ([place]) 02/22/2016  . LBP (low back pain) 11/02/2013  . Bilateral inguinal hernia (BIH), s/p lap repair 12/16/2013 11/02/2013    Percival Spanish, PT, MPT 10/17/2016, 10:36 AM  [facility] [address]  [place], [place], [address] Phone: [phone]   Fax:  [phone]  Name: Jorge [name] MRN: [id number] Date of Birth: Mar 27, 1956

## 2016-10-21 ENCOUNTER — Ambulatory Visit: Payer: 59 | Admitting: Physical Therapy

## 2016-10-21 DIAGNOSIS — R293 Abnormal posture: Secondary | ICD-10-CM | POA: Diagnosis not present

## 2016-10-21 DIAGNOSIS — M546 Pain in thoracic spine: Secondary | ICD-10-CM

## 2016-10-21 DIAGNOSIS — M6281 Muscle weakness (generalized): Secondary | ICD-10-CM

## 2016-10-21 NOTE — Therapy (Signed)
[facility] [address]  [place], [place], [address] Phone: [phone]   Fax:  [phone]  Physical Therapy Treatment  Patient Details  Name: Jorge [name] MRN: [id number] Date of Birth: 1955-08-25 Referring Provider: Lovena Neighbours, ANP  Encounter Date: 10/21/2016      PT End of Session - 10/21/16 1702    Visit Number 9   Number of Visits 21   Date for PT Re-Evaluation 11/28/16   PT Start Time 1702   PT Stop Time 1742   PT Time Calculation (min) 40 min   Activity Tolerance Patient tolerated treatment well;No increased pain   Behavior During Therapy WFL for tasks assessed/performed      Past Medical History:  Diagnosis Date  . Anxiety   . Bone metastasis ([place])   . Chest cold 05/19/2016   productive cough  -- started on antibiotic  . Chronic back pain    due to bone mets from myeloma  . Cough   . Depression   . GERD (gastroesophageal reflux disease)   . Hiatal hernia   . History of chicken pox   . History of concussion    age 89 -- no residual  . History of DVT of lower extremity 03/21/2016  treated and completed w/ xarelto   per doppler left extensive occlusion common femoral, femoral, and popliteal veins and right partial occlusion common femoral and profunda femoral veins/  last doppler 06-04-2016 no evidence acute or chronic dvt noted either leg   . History of radiation therapy 06/09/16-06/23/16   lower thoracic spine 25 Gy in 10 fractions  . Mouth ulcers    secondary to radiation  . Multiple myeloma ([place]) dx 02/22/2016 via bone marrow bx---  oncologist-  [name]   IgG Kappa-- Hyperdiploid/ +11 w/ bone mets--  current treatment chemotherapy (started 08/ 2017)and pallitive radiation to back started 06-09-2016  . Renal calculus, right   . Wears contact lenses     Past Surgical History:  Procedure Laterality Date  . COLONOSCOPY  [id number]  . CYSTOSCOPY W/ URETERAL STENT PLACEMENT Right  06/20/2016   Procedure: CYSTOSCOPY WITH STENT REPLACEMENT;  Surgeon: Kathie Rhodes, MD;  Location: [facility];  Service: Urology;  Laterality: Right;  . CYSTOSCOPY WITH RETROGRADE PYELOGRAM, URETEROSCOPY AND STENT PLACEMENT Right 05/30/2016   Procedure: CYSTOSCOPY WITH RETROGRADE PYELOGRAM, URETEROSCOPY AND STENT PLACEMENT,DILITATION URETERAL STRICTURE;  Surgeon: Kathie Rhodes, MD;  Location: [facility];  Service: Urology;  Laterality: Right;  . CYSTOSCOPY/RETROGRADE/URETEROSCOPY/STONE EXTRACTION WITH BASKET Right 06/20/2016   Procedure: CYSTOSCOPY/URETEROSCOPY/STONE EXTRACTION WITH BASKET;  Surgeon: Kathie Rhodes, MD;  Location: [facility];  Service: Urology;  Laterality: Right;  . HOLMIUM LASER APPLICATION Right 20/08/5425   Procedure: HOLMIUM LASER APPLICATION;  Surgeon: Kathie Rhodes, MD;  Location: [facility];  Service: Urology;  Laterality: Right;  . IR GENERIC HISTORICAL  02/11/2016   IR RADIOLOGIST EVAL & MGMT 02/11/2016 [facility]  . IR GENERIC HISTORICAL  02/15/2016   IR BONE TUMOR(S)RF ABLATION 02/15/2016 Luanne Bras, MD [facility]  . IR GENERIC HISTORICAL  02/15/2016   IR BONE TUMOR(S)RF ABLATION 02/15/2016 Luanne Bras, MD [facility]  . IR GENERIC HISTORICAL  02/15/2016   IR BONE TUMOR(S)RF ABLATION 02/15/2016 Luanne Bras, MD [facility]  . IR GENERIC HISTORICAL  02/15/2016   IR KYPHO THORACIC WITH BONE BIOPSY 02/15/2016 Luanne Bras, MD [facility]  . IR GENERIC HISTORICAL  02/15/2016   IR KYPHO THORACIC WITH BONE BIOPSY  02/15/2016 Luanne Bras, MD [facility]  . IR GENERIC HISTORICAL  02/15/2016   IR VERTEBROPLASTY CERV/THOR BX INC UNI/BIL INC/INJECT/IMAGING 02/15/2016 Luanne Bras, MD [facility]  . IR GENERIC HISTORICAL  03/13/2016   IR KYPHO EA ADDL LEVEL THORACIC OR LUMBAR 03/13/2016 Luanne Bras, MD [facility]  . IR GENERIC HISTORICAL  03/13/2016   IR KYPHO EA ADDL LEVEL THORACIC OR LUMBAR 03/13/2016 Luanne Bras, MD [facility]  . IR GENERIC HISTORICAL  03/13/2016   IR BONE TUMOR(S)RF ABLATION 03/13/2016 Luanne Bras, MD [facility]  . IR GENERIC HISTORICAL  03/13/2016   IR KYPHO LUMBAR INC FX REDUCE BONE BX UNI/BIL CANNULATION INC/IMAGING 03/13/2016 Luanne Bras, MD [facility]  . IR GENERIC HISTORICAL  03/13/2016   IR BONE TUMOR(S)RF ABLATION 03/13/2016 Luanne Bras, MD [facility]  . IR GENERIC HISTORICAL  03/13/2016   IR BONE TUMOR(S)RF ABLATION 03/13/2016 Luanne Bras, MD [facility]  . IR GENERIC HISTORICAL  03/31/2016   IR RADIOLOGIST EVAL & MGMT 03/31/2016 [facility]  . LAPAROSCOPIC INGUINAL HERNIA REPAIR Bilateral 12-16-2013  dr gross  . RADIOLOGY WITH ANESTHESIA N/A 02/15/2016   Procedure: Spinal Ablation;  Surgeon: Luanne Bras, MD;  Location: [facility];  Service: Radiology;  Laterality: N/A;  . RADIOLOGY WITH ANESTHESIA N/A 03/13/2016   Procedure: LUMBER ABLATION;  Surgeon: Luanne Bras, MD;  Location: [place];  Service: Radiology;  Laterality: N/A;  . ROTATOR CUFF REPAIR Right 2003  . TONSILLECTOMY  age 61  . WISDOM TOOTH EXTRACTION      There were no vitals filed for this visit.      Subjective Assessment - 10/21/16 1705    Subjective Pt reporting pain was up to 5-6/10 yesterday after driving to [facility] on Sunday and standing around to watch his dtr run in a 1/2 marathon. Pain better today, just stiff, but able to walk 2 miles w/o brace this morning. Lumbar MRI scheduled for tomorrow.   Pertinent History multipe myeloma with multiple vertebral compression fractures with kyphplasties and vertbuloplasties.  Pt had stem cell tranplant on Feb. 9, 2018 at [facility] and reports that his bloodwork levels are OK.  He has them checked by Dr. Marin Olp Frequently.  He is referred for "outpatient physical therapy for strength and conditioning. Due to extensive vertebral involvement, and compression fractures. avoid all flexion based activity and not lifting> 10 pounds"    Patient Stated Goals be able to strenthen  my back and reduce pain, increase walking and drive a car without pain    Currently in Pain? Yes   Pain Score --  2-3/10   Pain Location Back   Pain Orientation Mid;Medial   Pain Descriptors / Indicators Dull;Aching  stiffness   Pain Onset More than a month ago                         [facility] Adult PT Treatment/Exercise - 10/21/16 1702      Lumbar Exercises: Aerobic   Stationary Bike lvl 2 x 6'     Lumbar Exercises: Seated   Long Arc Quad on [place] Both;10 reps   Hip Flexion on [place] Both;10 reps   Other Seated Lumbar Exercises B row & shoulder extension 20x3', B pallof press 15x3" with green TB seated on green (65 cm) Pball; cues to maintain neutral pelvis position     Lumbar Exercises: Supine   Clam 15 reps;3 seconds   Clam Limitations 2 sets - 1st set with red TB, 2nd set  alt bent-knee fall-out - 2# cuff wt at knee   Bridge 15 reps;3 seconds   Bridge Limitations + isometric hip ABD with red TB, limited lift   Straight Leg Raise 10 reps;3 seconds   Straight Leg Raises Limitations knee extension + eccentirc SLR lowering ~6 inches     Lumbar Exercises: Sidelying   Clam 15 reps;3 seconds   Clam Limitations red TB     Lumbar Exercises: Quadruped   [place]/Old Horse 10 reps   Madcat/Old Horse Limitations 3" hold   Single Arm Raise Right;Left;10 reps;2 seconds   Single Arm Raises Limitations quadruped w/o torso support   Straight Leg Raise 10 reps;2 seconds                     PT Long Term Goals - 10/14/16 1709      PT LONG TERM GOAL #1   Title Pt will report a decrease in pain so that he is able to sit at his computer and work 30 minutes by 10/14/16   Status Achieved  currently tolerating up to 30 minutes in sitting     PT LONG TERM GOAL #2   Title Pt will increase number of repetitions of sit to stand to 14 in 30 seconds to demonstrate and increase in functional strength by 10/14/16   Status Achieved   pt able to complete 16 reps sit to stand in 30 sec     PT LONG TERM GOAL #3   Title Pt will be independent in a home program of exercise to increase back extension strength and decrease pain by 11/28/16   Status Revised  met for current HEP     PT LONG TERM GOAL #4   Title Pt will report he can drive his car short distances without pain to increase his functional independence by 10/14/16   Status Achieved     PT LONG TERM GOAL #5   Title Pt will report he can drive his car for 38-75 minutes without limitation due to pain to increase his functional independence by 11/28/16   Status New     PT LONG TERM GOAL #6   Title Pt will report no increase in pain with transitions in/out car by 11/28/16   Status New     PT LONG TERM GOAL #7   Title Pt will be able to walk 5K w/o brace or limitation due to back pain by 11/28/16   Status New     PT LONG TERM GOAL #8   Title Pt will tolerate standing in the kitchen for >/= 45 minutes w/o limitation due to back pain to allow for meal prep by 11/28/16           Long Term Clinic Goals - 09/16/16 1705      CC Long Term Goal  #1   Title Pt will report a decrease in pain so that he is able to sit at his computer and work 30 minutes    Time 4   Period Weeks   Status New     CC Long Term Goal  #2   Title Pt will increase number of repetitions of sit to stand to 14 in 30 seconds to demonstrate and increase in functional strength   Time 4   Period Weeks   Status New     CC Long Term Goal  #3   Title Pt will be independent in a home program of exercise to increase back extension strength and decrease  pain.   Time 4   Period Weeks     CC Long Term Goal  #4   Title Pt will report he can drive his car short distances without pain to increase his functional independence    Time 4   Period Weeks   Status New            Plan - 10/21/16 1750    Clinical Impression Statement Pt continues to tolerate progression of exercises with increasing  complexity, decreasing support and increased resistance. Continues to require close monitoring for posture and cues to avoid sacral sitting with activities/exercises seated on Pball. Able to progress quadruped exercises w/o need fro torso support today. Pt continued to demonstrate good potential to benefit from skilled PT to promote improved core & lumbar strengthening/stabilization. Taping deferred as MRI pending for tomorrow.   Rehab Potential Good   Clinical Impairments Affecting Rehab Potential multiple spinal compression fractures, recovering blood parameters due to stem cell transplant    PT Treatment/Interventions ADLs/Self Care Home Management;Patient/family education;Energy conservation;Gait training;Functional mobility training;Therapeutic activities;Therapeutic exercise;Balance training;Neuromuscular re-education;Moist Heat;Electrical Stimulation;Cryotherapy;Stair training   PT Next Visit Plan progress supported supine position for meeks decompression exercise; lumbar strengthening/stabilization with extension preference; kinesiotaping for back pain as indicated   Consulted and Agree with Plan of Care Patient      Patient will benefit from skilled therapeutic intervention in order to improve the following deficits and impairments:  Decreased endurance, Decreased knowledge of precautions, Decreased activity tolerance, Decreased strength, Pain, Decreased mobility, Postural dysfunction, Impaired perceived functional ability  Visit Diagnosis: Abnormal posture  Muscle weakness (generalized)  Pain in thoracic spine     Problem List Patient Active Problem List   Diagnosis Date Noted  . Phlebitis or thrombophlebitis of lower extremity ([place]) 07/21/2016  . Visit for preventive health examination 05/16/2016  . Aphthous ulcer 05/16/2016  . Ureterolithiasis 05/16/2016  . Acute deep vein thrombosis (DVT) of femoral vein of left lower extremity ([place]) 03/21/2016  . Multiple myeloma ([place])  02/22/2016  . Multiple myeloma not having achieved remission ([place]) 02/22/2016  . LBP (low back pain) 11/02/2013  . Bilateral inguinal hernia (BIH), s/p lap repair 12/16/2013 11/02/2013    Percival Spanish, PT, MPT 10/21/2016, 5:58 PM  [facility] [address]  [place], [place], [address] Phone: [phone]   Fax:  [phone]  Name: Jorge [name] MRN: [id number] Date of Birth: 10-21-1955

## 2016-10-22 DIAGNOSIS — M8440XA Pathological fracture, unspecified site, initial encounter for fracture: Secondary | ICD-10-CM | POA: Diagnosis not present

## 2016-10-22 DIAGNOSIS — C7951 Secondary malignant neoplasm of bone: Secondary | ICD-10-CM | POA: Diagnosis not present

## 2016-10-22 DIAGNOSIS — S2241XA Multiple fractures of ribs, right side, initial encounter for closed fracture: Secondary | ICD-10-CM | POA: Diagnosis not present

## 2016-10-24 ENCOUNTER — Ambulatory Visit: Payer: 59 | Admitting: Physical Therapy

## 2016-10-24 ENCOUNTER — Other Ambulatory Visit: Payer: 59

## 2016-10-24 DIAGNOSIS — M6281 Muscle weakness (generalized): Secondary | ICD-10-CM

## 2016-10-24 DIAGNOSIS — M546 Pain in thoracic spine: Secondary | ICD-10-CM

## 2016-10-24 DIAGNOSIS — R293 Abnormal posture: Secondary | ICD-10-CM | POA: Diagnosis not present

## 2016-10-24 NOTE — Therapy (Signed)
[facility] [address]  [place], [place], [address] Phone: [phone]   Fax:  [phone]  Physical Therapy Treatment  Patient Details  Name: Jorge [name] MRN: [id number] Date of Birth: 09/29/1955 Referring Provider: Lovena Neighbours, ANP  Encounter Date: 10/24/2016      PT End of Session - 10/24/16 0936    Visit Number 10   Number of Visits 21   Date for PT Re-Evaluation 11/28/16   PT Start Time 0936   PT Stop Time 1026   PT Time Calculation (min) 50 min   Activity Tolerance Patient tolerated treatment well;No increased pain   Behavior During Therapy WFL for tasks assessed/performed      Past Medical History:  Diagnosis Date  . Anxiety   . Bone metastasis ([place])   . Chest cold 05/19/2016   productive cough  -- started on antibiotic  . Chronic back pain    due to bone mets from myeloma  . Cough   . Depression   . GERD (gastroesophageal reflux disease)   . Hiatal hernia   . History of chicken pox   . History of concussion    age 85 -- no residual  . History of DVT of lower extremity 03/21/2016  treated and completed w/ xarelto   per doppler left extensive occlusion common femoral, femoral, and popliteal veins and right partial occlusion common femoral and profunda femoral veins/  last doppler 06-04-2016 no evidence acute or chronic dvt noted either leg   . History of radiation therapy 06/09/16-06/23/16   lower thoracic spine 25 Gy in 10 fractions  . Mouth ulcers    secondary to radiation  . Multiple myeloma ([place]) dx 02/22/2016 via bone marrow bx---  oncologist-  [name]   IgG Kappa-- Hyperdiploid/ +11 w/ bone mets--  current treatment chemotherapy (started 08/ 2017)and pallitive radiation to back started 06-09-2016  . Renal calculus, right   . Wears contact lenses     Past Surgical History:  Procedure Laterality Date  . COLONOSCOPY  [id number]  . CYSTOSCOPY W/ URETERAL STENT PLACEMENT Right  06/20/2016   Procedure: CYSTOSCOPY WITH STENT REPLACEMENT;  Surgeon: Kathie Rhodes, MD;  Location: [facility];  Service: Urology;  Laterality: Right;  . CYSTOSCOPY WITH RETROGRADE PYELOGRAM, URETEROSCOPY AND STENT PLACEMENT Right 05/30/2016   Procedure: CYSTOSCOPY WITH RETROGRADE PYELOGRAM, URETEROSCOPY AND STENT PLACEMENT,DILITATION URETERAL STRICTURE;  Surgeon: Kathie Rhodes, MD;  Location: [facility];  Service: Urology;  Laterality: Right;  . CYSTOSCOPY/RETROGRADE/URETEROSCOPY/STONE EXTRACTION WITH BASKET Right 06/20/2016   Procedure: CYSTOSCOPY/URETEROSCOPY/STONE EXTRACTION WITH BASKET;  Surgeon: Kathie Rhodes, MD;  Location: [facility];  Service: Urology;  Laterality: Right;  . HOLMIUM LASER APPLICATION Right 87/02/6766   Procedure: HOLMIUM LASER APPLICATION;  Surgeon: Kathie Rhodes, MD;  Location: [facility];  Service: Urology;  Laterality: Right;  . IR GENERIC HISTORICAL  02/11/2016   IR RADIOLOGIST EVAL & MGMT 02/11/2016 [facility]  . IR GENERIC HISTORICAL  02/15/2016   IR BONE TUMOR(S)RF ABLATION 02/15/2016 Luanne Bras, MD [facility]  . IR GENERIC HISTORICAL  02/15/2016   IR BONE TUMOR(S)RF ABLATION 02/15/2016 Luanne Bras, MD [facility]  . IR GENERIC HISTORICAL  02/15/2016   IR BONE TUMOR(S)RF ABLATION 02/15/2016 Luanne Bras, MD [facility]  . IR GENERIC HISTORICAL  02/15/2016   IR KYPHO THORACIC WITH BONE BIOPSY 02/15/2016 Luanne Bras, MD [facility]  . IR GENERIC HISTORICAL  02/15/2016   IR KYPHO THORACIC WITH BONE BIOPSY  02/15/2016 Luanne Bras, MD [facility]  . IR GENERIC HISTORICAL  02/15/2016   IR VERTEBROPLASTY CERV/THOR BX INC UNI/BIL INC/INJECT/IMAGING 02/15/2016 Luanne Bras, MD [facility]  . IR GENERIC HISTORICAL  03/13/2016   IR KYPHO EA ADDL LEVEL THORACIC OR LUMBAR 03/13/2016 Luanne Bras, MD [facility]  . IR GENERIC HISTORICAL  03/13/2016   IR KYPHO EA ADDL LEVEL THORACIC OR LUMBAR 03/13/2016 Luanne Bras, MD [facility]  . IR GENERIC HISTORICAL  03/13/2016   IR BONE TUMOR(S)RF ABLATION 03/13/2016 Luanne Bras, MD [facility]  . IR GENERIC HISTORICAL  03/13/2016   IR KYPHO LUMBAR INC FX REDUCE BONE BX UNI/BIL CANNULATION INC/IMAGING 03/13/2016 Luanne Bras, MD [facility]  . IR GENERIC HISTORICAL  03/13/2016   IR BONE TUMOR(S)RF ABLATION 03/13/2016 Luanne Bras, MD [facility]  . IR GENERIC HISTORICAL  03/13/2016   IR BONE TUMOR(S)RF ABLATION 03/13/2016 Luanne Bras, MD [facility]  . IR GENERIC HISTORICAL  03/31/2016   IR RADIOLOGIST EVAL & MGMT 03/31/2016 [facility]  . LAPAROSCOPIC INGUINAL HERNIA REPAIR Bilateral 12-16-2013  dr gross  . RADIOLOGY WITH ANESTHESIA N/A 02/15/2016   Procedure: Spinal Ablation;  Surgeon: Luanne Bras, MD;  Location: [facility];  Service: Radiology;  Laterality: N/A;  . RADIOLOGY WITH ANESTHESIA N/A 03/13/2016   Procedure: LUMBER ABLATION;  Surgeon: Luanne Bras, MD;  Location: [place];  Service: Radiology;  Laterality: N/A;  . ROTATOR CUFF REPAIR Right 2003  . TONSILLECTOMY  age 89  . WISDOM TOOTH EXTRACTION      There were no vitals filed for this visit.      Subjective Assessment - 10/24/16 0938    Subjective Pt completed MRI on Wed and will get results when he sees MD next Thurs.   Pertinent History multipe myeloma with multiple vertebral compression fractures with kyphplasties and vertbuloplasties.  Pt had stem cell tranplant on Feb. 9, 2018 at [facility] and reports that his bloodwork levels are OK.  He has them checked by Dr. Marin Olp Frequently.  He is referred for "outpatient physical therapy for strength and conditioning. Due to extensive vertebral involvement, and compression fractures. avoid all flexion based activity and not lifting> 10 pounds"   Patient Stated Goals be able to strenthen  my back and reduce pain, increase walking and drive a car without pain    Currently in Pain? Yes   Pain Score --  2-3/10   Pain  Location Back   Pain Orientation Mid;Medial   Pain Descriptors / Indicators Dull;Aching   Pain Type Chronic pain   Pain Onset More than a month ago   Pain Frequency Constant   Aggravating Factors  getting in/out of vehicle, laying in bed, walking to a lesser degree   Pain Relieving Factors rest   Effect of Pain on Daily Activities poor tolerance for leaning fwd in standing when bathing, grooming or working in the kitchen; difficulty stooping down to pick up things                         [facility] Adult PT Treatment/Exercise - 10/24/16 0936      Lumbar Exercises: Stretches   Hip Flexor Stretch 30 seconds;2 reps   Hip Flexor Stretch Limitations standing lunge position with rear knee on chair   Prone on Elbows Stretch 30 seconds;2 reps     Lumbar Exercises: Aerobic   Stationary Bike lvl 2 x 6'     Lumbar Exercises: Supine   Clam 20 reps;3 seconds  Clam Limitations 2 sets - 1st set alt hip ABD/ER with looped red TB, 2nd set unilateral hip ADD/IR with red TB   Dead Bug 15 reps;2 seconds   Dead Bug Limitations with crossed yellow TB   Bridge 20 reps;3 seconds   Bridge Limitations + isometric hip ABD with red TB, limited lift     Lumbar Exercises: Prone   Other Prone Lumbar Exercises Prone lying 3x60" with 2 POE x30" interspersed     Knee/Hip Exercises: Standing   Hip Flexion Both;10 reps;Knee straight   Hip Flexion Limitations red TB with 3" hold, 1 pole support   Forward Lunges Both;10 reps;3 seconds   Forward Lunges Limitations rear leg supported on seat of chair   Hip ADduction Both;10 reps   Hip ADduction Limitations red TB with 3" hold, 1 pole support   Hip Abduction Both;10 reps;Knee straight   Abduction Limitations red TB with 3" hold, 1 pole support   Hip Extension Both;10 reps;Knee straight   Extension Limitations red TB with 3" hold, 1 pole support     Manual Therapy   Manual Therapy Taping   Kinesiotex Inhibit Muscle     Kinesiotix   Inhibit Muscle   2 parallel "I" strips 30% along paraspinals from lower lumbar to upper thoracic spine, peripendicular "I" strip 50% at area of greatest pain/muscle tension (lower thoracic spine at distal angle of scapula)                PT Education - 10/24/16 1028    Education provided Yes   Education Details Progressed 4 way standing SLR to red TB for HEP   Person(s) Educated Patient   Methods Explanation;Demonstration   Comprehension Verbalized understanding;Returned demonstration             PT Long Term Goals - 10/14/16 1709      PT LONG TERM GOAL #1   Title Pt will report a decrease in pain so that he is able to sit at his computer and work 30 minutes by 10/14/16   Status Achieved  currently tolerating up to 30 minutes in sitting     PT LONG TERM GOAL #2   Title Pt will increase number of repetitions of sit to stand to 14 in 30 seconds to demonstrate and increase in functional strength by 10/14/16   Status Achieved  pt able to complete 16 reps sit to stand in 30 sec     PT LONG TERM GOAL #3   Title Pt will be independent in a home program of exercise to increase back extension strength and decrease pain by 11/28/16   Status Revised  met for current HEP     PT LONG TERM GOAL #4   Title Pt will report he can drive his car short distances without pain to increase his functional independence by 10/14/16   Status Achieved     PT LONG TERM GOAL #5   Title Pt will report he can drive his car for 16-38 minutes without limitation due to pain to increase his functional independence by 11/28/16   Status New     PT LONG TERM GOAL #6   Title Pt will report no increase in pain with transitions in/out car by 11/28/16   Status New     PT LONG TERM GOAL #7   Title Pt will be able to walk 5K w/o brace or limitation due to back pain by 11/28/16   Status New     PT LONG TERM  GOAL #8   Title Pt will tolerate standing in the kitchen for >/= 45 minutes w/o limitation due to back pain to allow for  meal prep by 11/28/16           Long Term Clinic Goals - 09/16/16 1705      CC Long Term Goal  #1   Title Pt will report a decrease in pain so that he is able to sit at his computer and work 30 minutes    Time 4   Period Weeks   Status New     CC Long Term Goal  #2   Title Pt will increase number of repetitions of sit to stand to 14 in 30 seconds to demonstrate and increase in functional strength   Time 4   Period Weeks   Status New     CC Long Term Goal  #3   Title Pt will be independent in a home program of exercise to increase back extension strength and decrease pain.   Time 4   Period Weeks     CC Long Term Goal  #4   Title Pt will report he can drive his car short distances without pain to increase his functional independence    Time 4   Period Weeks   Status New            Plan - 10/24/16 1021    Clinical Impression Statement Pt demonstrating improving tolerance for supine positioning with decreasing need for pillow support under head (down to single pillow today) and able to initiate prone lying for 3 - 60 sec intervals interspersed with POE 2x30 sec. Pt demonstrating improving upright posture during standing 4 way hip strengthening while progressing resistance to red TB.    Rehab Potential Good   Clinical Impairments Affecting Rehab Potential multiple spinal compression fractures, recovering blood parameters due to stem cell transplant    PT Treatment/Interventions ADLs/Self Care Home Management;Patient/family education;Energy conservation;Gait training;Functional mobility training;Therapeutic activities;Therapeutic exercise;Balance training;Neuromuscular re-education;Moist Heat;Electrical Stimulation;Cryotherapy;Stair training   PT Next Visit Plan lumbar strengthening/stabilization with extension preference; kinesiotaping for back pain as indicated   Consulted and Agree with Plan of Care Patient      Patient will benefit from skilled therapeutic intervention  in order to improve the following deficits and impairments:  Decreased endurance, Decreased knowledge of precautions, Decreased activity tolerance, Decreased strength, Pain, Decreased mobility, Postural dysfunction, Impaired perceived functional ability  Visit Diagnosis: Abnormal posture  Muscle weakness (generalized)  Pain in thoracic spine     Problem List Patient Active Problem List   Diagnosis Date Noted  . Phlebitis or thrombophlebitis of lower extremity ([place]) 07/21/2016  . Visit for preventive health examination 05/16/2016  . Aphthous ulcer 05/16/2016  . Ureterolithiasis 05/16/2016  . Acute deep vein thrombosis (DVT) of femoral vein of left lower extremity ([place]) 03/21/2016  . Multiple myeloma ([place]) 02/22/2016  . Multiple myeloma not having achieved remission ([place]) 02/22/2016  . LBP (low back pain) 11/02/2013  . Bilateral inguinal hernia (BIH), s/p lap repair 12/16/2013 11/02/2013    Percival Spanish, PT, MPT 10/24/2016, 10:34 AM  [facility] [address]  [place], [place], [address] Phone: [phone]   Fax:  [phone]  Name: Jorge [name] MRN: [id number] Date of Birth: 1955-08-16

## 2016-10-28 ENCOUNTER — Ambulatory Visit: Payer: 59 | Admitting: Physical Therapy

## 2016-10-28 DIAGNOSIS — M6281 Muscle weakness (generalized): Secondary | ICD-10-CM

## 2016-10-28 DIAGNOSIS — M546 Pain in thoracic spine: Secondary | ICD-10-CM

## 2016-10-28 DIAGNOSIS — R293 Abnormal posture: Secondary | ICD-10-CM | POA: Diagnosis not present

## 2016-10-28 NOTE — Therapy (Signed)
[facility] [address]  [place], [place], [address] Phone: [phone]   Fax:  [phone]  Physical Therapy Treatment  Patient Details  Name: Jorge [name] MRN: [id number] Date of Birth: 1956/01/21 Referring Provider: Lovena Neighbours, ANP  Encounter Date: 10/28/2016      PT End of Session - 10/28/16 1700    Visit Number 11   Number of Visits 21   Date for PT Re-Evaluation 11/28/16   PT Start Time 1700   PT Stop Time 4944   PT Time Calculation (min) 44 min   Activity Tolerance Patient tolerated treatment well;No increased pain   Behavior During Therapy WFL for tasks assessed/performed      Past Medical History:  Diagnosis Date  . Anxiety   . Bone metastasis ([place])   . Chest cold 05/19/2016   productive cough  -- started on antibiotic  . Chronic back pain    due to bone mets from myeloma  . Cough   . Depression   . GERD (gastroesophageal reflux disease)   . Hiatal hernia   . History of chicken pox   . History of concussion    age 50 -- no residual  . History of DVT of lower extremity 03/21/2016  treated and completed w/ xarelto   per doppler left extensive occlusion common femoral, femoral, and popliteal veins and right partial occlusion common femoral and profunda femoral veins/  last doppler 06-04-2016 no evidence acute or chronic dvt noted either leg   . History of radiation therapy 06/09/16-06/23/16   lower thoracic spine 25 Gy in 10 fractions  . Mouth ulcers    secondary to radiation  . Multiple myeloma ([place]) dx 02/22/2016 via bone marrow bx---  oncologist-  [name]   IgG Kappa-- Hyperdiploid/ +11 w/ bone mets--  current treatment chemotherapy (started 08/ 2017)and pallitive radiation to back started 06-09-2016  . Renal calculus, right   . Wears contact lenses     Past Surgical History:  Procedure Laterality Date  . COLONOSCOPY  [id number]  . CYSTOSCOPY W/ URETERAL STENT PLACEMENT Right  06/20/2016   Procedure: CYSTOSCOPY WITH STENT REPLACEMENT;  Surgeon: Kathie Rhodes, MD;  Location: [facility];  Service: Urology;  Laterality: Right;  . CYSTOSCOPY WITH RETROGRADE PYELOGRAM, URETEROSCOPY AND STENT PLACEMENT Right 05/30/2016   Procedure: CYSTOSCOPY WITH RETROGRADE PYELOGRAM, URETEROSCOPY AND STENT PLACEMENT,DILITATION URETERAL STRICTURE;  Surgeon: Kathie Rhodes, MD;  Location[facility];  Service: Urology;  Laterality: Right;  . CYSTOSCOPY/RETROGRADE/URETEROSCOPY/STONE EXTRACTION WITH BASKET Right 06/20/2016   Procedure: CYSTOSCOPY/URETEROSCOPY/STONE EXTRACTION WITH BASKET;  Surgeon: Kathie Rhodes, MD;  Location: [facility];  Service: Urology;  Laterality: Right;  . HOLMIUM LASER APPLICATION Right 96/01/5915   Procedure: HOLMIUM LASER APPLICATION;  Surgeon: Kathie Rhodes, MD;  Location: [facility];  Service: Urology;  Laterality: Right;  . IR GENERIC HISTORICAL  02/11/2016   IR RADIOLOGIST EVAL & MGMT 02/11/2016 [facility]  . IR GENERIC HISTORICAL  02/15/2016   IR BONE TUMOR(S)RF ABLATION 02/15/2016 Luanne Bras, MD [facility]  . IR GENERIC HISTORICAL  02/15/2016   IR BONE TUMOR(S)RF ABLATION 02/15/2016 Luanne Bras, MD [facility]  . IR GENERIC HISTORICAL  02/15/2016   IR BONE TUMOR(S)RF ABLATION 02/15/2016 Luanne Bras, MD [facility]  . IR GENERIC HISTORICAL  02/15/2016   IR KYPHO THORACIC WITH BONE BIOPSY 02/15/2016 Luanne Bras, MD [facility]  . IR GENERIC HISTORICAL  02/15/2016   IR KYPHO THORACIC WITH BONE BIOPSY  02/15/2016 Luanne Bras, MD [facility]  . IR GENERIC HISTORICAL  02/15/2016   IR VERTEBROPLASTY CERV/THOR BX INC UNI/BIL INC/INJECT/IMAGING 02/15/2016 Luanne Bras, MD [facility]  . IR GENERIC HISTORICAL  03/13/2016   IR KYPHO EA ADDL LEVEL THORACIC OR LUMBAR 03/13/2016 Luanne Bras, MD [facility]  . IR GENERIC HISTORICAL  03/13/2016   IR KYPHO EA ADDL LEVEL THORACIC OR LUMBAR 03/13/2016 Luanne Bras, MD [facility]  . IR GENERIC HISTORICAL  03/13/2016   IR BONE TUMOR(S)RF ABLATION 03/13/2016 Luanne Bras, MD [facility]  . IR GENERIC HISTORICAL  03/13/2016   IR KYPHO LUMBAR INC FX REDUCE BONE BX UNI/BIL CANNULATION INC/IMAGING 03/13/2016 Luanne Bras, MD [facility]  . IR GENERIC HISTORICAL  03/13/2016   IR BONE TUMOR(S)RF ABLATION 03/13/2016 Luanne Bras, MD [facility]  . IR GENERIC HISTORICAL  03/13/2016   IR BONE TUMOR(S)RF ABLATION 03/13/2016 Luanne Bras, MD [facility]  . IR GENERIC HISTORICAL  03/31/2016   IR RADIOLOGIST EVAL & MGMT 03/31/2016 [facility]  . LAPAROSCOPIC INGUINAL HERNIA REPAIR Bilateral 12-16-2013  dr gross  . RADIOLOGY WITH ANESTHESIA N/A 02/15/2016   Procedure: Spinal Ablation;  Surgeon: Luanne Bras, MD;  Location: [facility];  Service: Radiology;  Laterality: N/A;  . RADIOLOGY WITH ANESTHESIA N/A 03/13/2016   Procedure: LUMBER ABLATION;  Surgeon: Luanne Bras, MD;  Location: [place] [facility];  Service: Radiology;  Laterality: N/A;  . ROTATOR CUFF REPAIR Right 2003  . TONSILLECTOMY  age 9  . WISDOM TOOTH EXTRACTION      There were no vitals filed for this visit.      Subjective Assessment - 10/28/16 1705    Pertinent History multiple myeloma with multiple vertebral compression fractures with kyphplasties and vertbuloplasties.  Pt had stem cell tranplant on Feb. 9, 2018 at [facility] and reports that his bloodwork levels are OK.  He has them checked by Dr. Marin Olp Frequently.  He is referred for "outpatient physical therapy for strength and conditioning. Due to extensive vertebral involvement, and compression fractures. avoid all flexion based activity and not lifting> 10 pounds"   Patient Stated Goals be able to strenthen  my back and reduce pain, increase walking and drive a car without pain    Currently in Pain? Yes   Pain Score 2    Pain Orientation Mid;Medial   Pain Descriptors / Indicators Dull;Aching   Pain Type Chronic pain    Pain Radiating Towards wrapping around to lateral ribs   Pain Onset More than a month ago   Pain Frequency Constant                         [facility] Adult PT Treatment/Exercise - 10/28/16 1700      Lumbar Exercises: Stretches   Prone on Elbows Stretch 60 seconds;2 reps     Lumbar Exercises: Aerobic   Stationary Bike lvl 2 x 6'     Lumbar Exercises: Standing   Wall Slides 15 reps;3 seconds   Wall Slides Limitations on green Pball on wall     Lumbar Exercises: Supine   Clam 20 reps;3 seconds   Clam Limitations 2 sets - 1st set alt hip ABD/ER with looped green TB, 2nd set unilateral hip ADD/IR with green TB   Bridge 20 reps;3 seconds   Bridge Limitations + isometric hip ABD with green TB, limited lift     Lumbar Exercises: Sidelying   Clam 15 reps;3 seconds   Clam Limitations green TB  Lumbar Exercises: Prone   Other Prone Lumbar Exercises Prone lying 2x60" alteranting with 2 POE x60" interspersed   Other Prone Lumbar Exercises Prone over green Pball - I's & T's with slight thoracic extension x10                     PT Long Term Goals - 10/28/16 1803      PT LONG TERM GOAL #1   Title Pt will report a decrease in pain so that he is able to sit at his computer and work 30 minutes by 10/14/16   Status Achieved  currently tolerating up to 30 minutes in sitting     PT LONG TERM GOAL #2   Title Pt will increase number of repetitions of sit to stand to 14 in 30 seconds to demonstrate and increase in functional strength by 10/14/16   Status Achieved  pt able to complete 16 reps sit to stand in 30 sec     PT LONG TERM GOAL #3   Title Pt will be independent in a home program of exercise to increase back extension strength and decrease pain by 11/28/16   Status On-going  met for current HEP     PT LONG TERM GOAL #4   Title Pt will report he can drive his car short distances without pain to increase his functional independence by 10/14/16   Status Achieved      PT LONG TERM GOAL #5   Title Pt will report he can drive his car for 83-15 minutes without limitation due to pain to increase his functional independence by 11/28/16   Status On-going     PT LONG TERM GOAL #6   Title Pt will report no increase in pain with transitions in/out car by 11/28/16   Status On-going     PT LONG TERM GOAL #7   Title Pt will be able to walk 5K w/o brace or limitation due to back pain by 11/28/16   Status On-going     PT LONG TERM GOAL #8   Title Pt will tolerate standing in the kitchen for >/= 45 minutes w/o limitation due to back pain to allow for meal prep by 11/28/16   Status On-going           Long Term Clinic Goals - 09/16/16 1705      CC Long Term Goal  #1   Title Pt will report a decrease in pain so that he is able to sit at his computer and work 30 minutes    Time 4   Period Weeks   Status New     CC Long Term Goal  #2   Title Pt will increase number of repetitions of sit to stand to 14 in 30 seconds to demonstrate and increase in functional strength   Time 4   Period Weeks   Status New     CC Long Term Goal  #3   Title Pt will be independent in a home program of exercise to increase back extension strength and decrease pain.   Time 4   Period Weeks     CC Long Term Goal  #4   Title Pt will report he can drive his car short distances without pain to increase his functional independence    Time 4   Period Weeks   Status New            Plan - 10/28/16 1804    Clinical Impression Statement Pt  able to tolerate progression of theraband resistance to green with supine exercises today and demonstrating increasing tolerance for POE extension. Pt noting improved ability to squat down to recover objects from floor due to improving LE strength. Introduced gentle thoracic extension with I's & T's leaning on Pball with no increased pain reported. Pt deferred kinesiotaping d/t upcoming MD visit.   Rehab Potential Good   Clinical Impairments  Affecting Rehab Potential multiple spinal compression fractures, recovering blood parameters due to stem cell transplant    PT Treatment/Interventions ADLs/Self Care Home Management;Patient/family education;Energy conservation;Gait training;Functional mobility training;Therapeutic activities;Therapeutic exercise;Balance training;Neuromuscular re-education;Moist Heat;Electrical Stimulation;Cryotherapy;Stair training   PT Next Visit Plan lumbar strengthening/stabilization with extension preference; kinesiotaping for back pain as indicated   Consulted and Agree with Plan of Care Patient      Patient will benefit from skilled therapeutic intervention in order to improve the following deficits and impairments:  Decreased endurance, Decreased knowledge of precautions, Decreased activity tolerance, Decreased strength, Pain, Decreased mobility, Postural dysfunction, Impaired perceived functional ability  Visit Diagnosis: Abnormal posture  Muscle weakness (generalized)  Pain in thoracic spine     Problem List Patient Active Problem List   Diagnosis Date Noted  . Phlebitis or thrombophlebitis of lower extremity 07/21/2016  . Visit for preventive health examination 05/16/2016  . Aphthous ulcer 05/16/2016  . Ureterolithiasis 05/16/2016  . Acute deep vein thrombosis (DVT) of femoral vein of left lower extremity ([place]) 03/21/2016  . Multiple myeloma ([place]) 02/22/2016  . Multiple myeloma not having achieved remission ([place]) 02/22/2016  . LBP (low back pain) 11/02/2013  . Bilateral inguinal hernia (BIH), s/p lap repair 12/16/2013 11/02/2013    Percival Spanish, PT, MPT 10/28/2016, 6:11 PM  [facility] [address]  [place], [place], [address] Phone: [phone]   Fax:  [phone]  Name: Jorge [name] MRN: [id number] Date of Birth: 09-Jun-1956

## 2016-10-30 DIAGNOSIS — M4850XG Collapsed vertebra, not elsewhere classified, site unspecified, subsequent encounter for fracture with delayed healing: Secondary | ICD-10-CM | POA: Diagnosis not present

## 2016-10-30 DIAGNOSIS — C9000 Multiple myeloma not having achieved remission: Secondary | ICD-10-CM | POA: Diagnosis not present

## 2016-10-31 ENCOUNTER — Ambulatory Visit: Payer: 59 | Admitting: Physical Therapy

## 2016-10-31 ENCOUNTER — Other Ambulatory Visit: Payer: 59

## 2016-10-31 DIAGNOSIS — R293 Abnormal posture: Secondary | ICD-10-CM | POA: Diagnosis not present

## 2016-10-31 DIAGNOSIS — M546 Pain in thoracic spine: Secondary | ICD-10-CM

## 2016-10-31 DIAGNOSIS — M6281 Muscle weakness (generalized): Secondary | ICD-10-CM

## 2016-10-31 NOTE — Therapy (Signed)
[facility] [address]  [place], [place], [address] Phone: [phone]   Fax:  [phone]  Physical Therapy Treatment  Patient Details  Name: Pancho Rushing MRN: [id number] Date of Birth: 03/03/1956 Referring Provider: Lovena Neighbours, ANP  Encounter Date: 10/31/2016      PT End of Session - 10/31/16 0849    Visit Number 12   Number of Visits 21   Date for PT Re-Evaluation 11/28/16   PT Start Time 0849   PT Stop Time 0935   PT Time Calculation (min) 46 min   Activity Tolerance Patient tolerated treatment well;No increased pain   Behavior During Therapy WFL for tasks assessed/performed      Past Medical History:  Diagnosis Date  . Anxiety   . Bone metastasis ([place])   . Chest cold 05/19/2016   productive cough  -- started on antibiotic  . Chronic back pain    due to bone mets from myeloma  . Cough   . Depression   . GERD (gastroesophageal reflux disease)   . Hiatal hernia   . History of chicken pox   . History of concussion    age 61 -- no residual  . History of DVT of lower extremity 03/21/2016  treated and completed w/ xarelto   per doppler left extensive occlusion common femoral, femoral, and popliteal veins and right partial occlusion common femoral and profunda femoral veins/  last doppler 06-04-2016 no evidence acute or chronic dvt noted either leg   . History of radiation therapy 06/09/16-06/23/16   lower thoracic spine 25 Gy in 10 fractions  . Mouth ulcers    secondary to radiation  . Multiple myeloma ([place]) dx 02/22/2016 via bone marrow bx---  oncologist-  [name]   IgG Kappa-- Hyperdiploid/ +11 w/ bone mets--  current treatment chemotherapy (started 08/ 2017)and pallitive radiation to back started 06-09-2016  . Renal calculus, right   . Wears contact lenses     Past Surgical History:  Procedure Laterality Date  . COLONOSCOPY  [id number]  . CYSTOSCOPY W/ URETERAL STENT PLACEMENT Right  06/20/2016   Procedure: CYSTOSCOPY WITH STENT REPLACEMENT;  Surgeon: Kathie Rhodes, MD;  Location: [facility];  Service: Urology;  Laterality: Right;  . CYSTOSCOPY WITH RETROGRADE PYELOGRAM, URETEROSCOPY AND STENT PLACEMENT Right 05/30/2016   Procedure: CYSTOSCOPY WITH RETROGRADE PYELOGRAM, URETEROSCOPY AND STENT PLACEMENT,DILITATION URETERAL STRICTURE;  Surgeon: Kathie Rhodes, MD;  Location: [facility];  Service: Urology;  Laterality: Right;  . CYSTOSCOPY/RETROGRADE/URETEROSCOPY/STONE EXTRACTION WITH BASKET Right 06/20/2016   Procedure: CYSTOSCOPY/URETEROSCOPY/STONE EXTRACTION WITH BASKET;  Surgeon: Kathie Rhodes, MD;  Location: [facility];  Service: Urology;  Laterality: Right;  . HOLMIUM LASER APPLICATION Right 62/11/6387   Procedure: HOLMIUM LASER APPLICATION;  Surgeon: Kathie Rhodes, MD;  Location: [facility];  Service: Urology;  Laterality: Right;  . IR GENERIC HISTORICAL  02/11/2016   IR RADIOLOGIST EVAL & MGMT 02/11/2016 [facility]  . IR GENERIC HISTORICAL  02/15/2016   IR BONE TUMOR(S)RF ABLATION 02/15/2016 Luanne Bras, MD [facility]  . IR GENERIC HISTORICAL  02/15/2016   IR BONE TUMOR(S)RF ABLATION 02/15/2016 Luanne Bras, MD [facility]  . IR GENERIC HISTORICAL  02/15/2016   IR BONE TUMOR(S)RF ABLATION 02/15/2016 Luanne Bras, MD [facility]  . IR GENERIC HISTORICAL  02/15/2016   IR KYPHO THORACIC WITH BONE BIOPSY 02/15/2016 Luanne Bras, MD [facility]  . IR GENERIC HISTORICAL  02/15/2016   IR KYPHO THORACIC WITH BONE BIOPSY  02/15/2016 Luanne Bras, MD [facility]  . IR GENERIC HISTORICAL  02/15/2016   IR VERTEBROPLASTY CERV/THOR BX INC UNI/BIL INC/INJECT/IMAGING 02/15/2016 Luanne Bras, MD [facility]  . IR GENERIC HISTORICAL  03/13/2016   IR KYPHO EA ADDL LEVEL THORACIC OR LUMBAR 03/13/2016 Luanne Bras, MD [facility]  . IR GENERIC HISTORICAL  03/13/2016   IR KYPHO EA ADDL LEVEL THORACIC OR LUMBAR 03/13/2016 Luanne Bras, MD [facility]  . IR GENERIC HISTORICAL  03/13/2016   IR BONE TUMOR(S)RF ABLATION 03/13/2016 Luanne Bras, MD [facility]  . IR GENERIC HISTORICAL  03/13/2016   IR KYPHO LUMBAR INC FX REDUCE BONE BX UNI/BIL CANNULATION INC/IMAGING 03/13/2016 Luanne Bras, MD [facility]  . IR GENERIC HISTORICAL  03/13/2016   IR BONE TUMOR(S)RF ABLATION 03/13/2016 Luanne Bras, MD [facility]  . IR GENERIC HISTORICAL  03/13/2016   IR BONE TUMOR(S)RF ABLATION 03/13/2016 Luanne Bras, MD [facility]  . IR GENERIC HISTORICAL  03/31/2016   IR RADIOLOGIST EVAL & MGMT 03/31/2016 [facility]  . LAPAROSCOPIC INGUINAL HERNIA REPAIR Bilateral 12-16-2013  dr gross  . RADIOLOGY WITH ANESTHESIA N/A 02/15/2016   Procedure: Spinal Ablation;  Surgeon: Luanne Bras, MD;  Location: [facility];  Service: Radiology;  Laterality: N/A;  . RADIOLOGY WITH ANESTHESIA N/A 03/13/2016   Procedure: LUMBER ABLATION;  Surgeon: Luanne Bras, MD;  Location: [facility];  Service: Radiology;  Laterality: N/A;  . ROTATOR CUFF REPAIR Right 2003  . TONSILLECTOMY  age 61  . WISDOM TOOTH EXTRACTION      There were no vitals filed for this visit.      Subjective Assessment - 10/31/16 0855    Subjective Saw MD at [facility] yesterday. States they are not going to do anything structural due to poor bone mineralization/integrity, but are planning to try kyphoplasty at T8. He is being referred to pain management. MD wants him to wear his brace more often when upright but ok to walk w/o brace for exercise.    Pertinent History multiple myeloma with multiple vertebral compression fractures with kyphplasties and vertbuloplasties.  Pt had stem cell tranplant on Feb. 9, 2018 [facility] and reports that his bloodwork levels are OK.  He has them checked by Dr. Marin Olp Frequently.  He is referred for "outpatient physical therapy for strength and conditioning. Due to extensive vertebral involvement, and compression fractures. avoid all  flexion based activity and not lifting> 10 pounds"   Patient Stated Goals be able to strenthen  my back and reduce pain, increase walking and drive a car without pain    Currently in Pain? Yes   Pain Score 3    Pain Location Back   Pain Orientation Mid;Medial   Pain Descriptors / Indicators Aching   Pain Onset More than a month ago                         [facility] Adult PT Treatment/Exercise - 10/31/16 0849      Lumbar Exercises: Aerobic   Stationary Bike NuStep - lvl 3 x 8'     Lumbar Exercises: Standing   Other Standing Lumbar Exercises B pallof press with green TB x20 each   Other Standing Lumbar Exercises Hip kickbacks standing torso leaning on green Pball resting on mat table x15     Lumbar Exercises: Prone   Other Prone Lumbar Exercises Prone over green Pball - I's & T's with slight thoracic extension x15; exercise also demonstrated and attemped in standing torso leaning  on green Pball resting on mat table x5 each     Shoulder Exercises: Standing   Horizontal ABduction Strengthening;Both;20 reps;Theraband   Theraband Level (Shoulder Horizontal ABduction) Level 3 (Green)   Horizontal ABduction Limitations standing with back along pool noodle on wall   External Rotation Strengthening;Both;20 reps;Theraband   Theraband Level (Shoulder External Rotation) Level 3 (Green)   External Rotation Limitations standing with back along pool noodle on wall   Flexion Strengthening;Both;20 reps;Theraband   Theraband Level (Shoulder Flexion) Level 2 (Red)   Flexion Limitations + opp shoulder extension; standing with back along pool noodle on wall     Manual Therapy   Manual Therapy Taping   Kinesiotex Inhibit Muscle     Kinesiotix   Inhibit Muscle  2 parallel "I" strips 30% along paraspinals from lower lumbar to upper thoracic spine, peripendicular "I" strip 50% at area of greatest pain/muscle tension (lower thoracic spine at distal angle of scapula)                 PT Education - 10/31/16 0930    Education provided Yes   Education Details HEP update - prone extension exercises on Pball   Person(s) Educated Patient   Methods Explanation;Demonstration;Handout   Comprehension Verbalized understanding;Returned demonstration             PT Long Term Goals - 10/28/16 1803      PT LONG TERM GOAL #1   Title Pt will report a decrease in pain so that he is able to sit at his computer and work 30 minutes by 10/14/16   Status Achieved  currently tolerating up to 30 minutes in sitting     PT LONG TERM GOAL #2   Title Pt will increase number of repetitions of sit to stand to 14 in 30 seconds to demonstrate and increase in functional strength by 10/14/16   Status Achieved  pt able to complete 16 reps sit to stand in 30 sec     PT LONG TERM GOAL #3   Title Pt will be independent in a home program of exercise to increase back extension strength and decrease pain by 11/28/16   Status On-going  met for current HEP     PT LONG TERM GOAL #4   Title Pt will report he can drive his car short distances without pain to increase his functional independence by 10/14/16   Status Achieved     PT LONG TERM GOAL #5   Title Pt will report he can drive his car for 06-26 minutes without limitation due to pain to increase his functional independence by 11/28/16   Status On-going     PT LONG TERM GOAL #6   Title Pt will report no increase in pain with transitions in/out car by 11/28/16   Status On-going     PT LONG TERM GOAL #7   Title Pt will be able to walk 5K w/o brace or limitation due to back pain by 11/28/16   Status On-going     PT LONG TERM GOAL #8   Title Pt will tolerate standing in the kitchen for >/= 45 minutes w/o limitation due to back pain to allow for meal prep by 11/28/16   Status On-going           Long Term Clinic Goals - 09/16/16 1705      CC Long Term Goal  #1   Title Pt will report a decrease in pain so that he is able to sit at  his computer  and work 30 minutes    Time 4   Period Weeks   Status New     CC Long Term Goal  #2   Title Pt will increase number of repetitions of sit to stand to 14 in 30 seconds to demonstrate and increase in functional strength   Time 4   Period Weeks   Status New     CC Long Term Goal  #3   Title Pt will be independent in a home program of exercise to increase back extension strength and decrease pain.   Time 4   Period Weeks     CC Long Term Goal  #4   Title Pt will report he can drive his car short distances without pain to increase his functional independence    Time 4   Period Weeks   Status New            Plan - 10/31/16 0935    Clinical Impression Statement Pt reports MD plans to proceed with conservative pain management for continued lower thoracic back pain, with probable kyphoplasty for T8 and referral to pain manangement. MD also wants pt to continue to wear TLSO more often when doing activities in standing, but is ok with pt walking w/o brace as long he remains aware of his posture. Continued empahsis on extension based strengthening exercises with progression of theraband resistance and addition of Pball exercises to HEP.   Rehab Potential Good   Clinical Impairments Affecting Rehab Potential multiple spinal compression fractures, recovering blood parameters due to stem cell transplant    PT Treatment/Interventions ADLs/Self Care Home Management;Patient/family education;Energy conservation;Gait training;Functional mobility training;Therapeutic activities;Therapeutic exercise;Balance training;Neuromuscular re-education;Moist Heat;Electrical Stimulation;Cryotherapy;Stair training   PT Next Visit Plan lumbar strengthening/stabilization with extension preference; kinesiotaping for back pain as indicated   Consulted and Agree with Plan of Care Patient      Patient will benefit from skilled therapeutic intervention in order to improve the following deficits and impairments:   Decreased endurance, Decreased knowledge of precautions, Decreased activity tolerance, Decreased strength, Pain, Decreased mobility, Postural dysfunction, Impaired perceived functional ability  Visit Diagnosis: Abnormal posture  Muscle weakness (generalized)  Pain in thoracic spine     Problem List Patient Active Problem List   Diagnosis Date Noted  . Phlebitis or thrombophlebitis of lower extremity 07/21/2016  . Visit for preventive health examination 05/16/2016  . Aphthous ulcer 05/16/2016  . Ureterolithiasis 05/16/2016  . Acute deep vein thrombosis (DVT) of femoral vein of left lower extremity ([place]) 03/21/2016  . Multiple myeloma ([place]) 02/22/2016  . Multiple myeloma not having achieved remission ([place]) 02/22/2016  . LBP (low back pain) 11/02/2013  . Bilateral inguinal hernia (BIH), s/p lap repair 12/16/2013 11/02/2013    Percival Spanish, PT, MPT 10/31/2016, 11:03 AM  [facility] [address]  [place], [place], [address] Phone: [phone]   Fax:  [phone]  Name: Harvey Matlack MRN: [id number] Date of Birth: 1955/10/03

## 2016-11-04 ENCOUNTER — Ambulatory Visit: Payer: 59 | Admitting: Physical Therapy

## 2016-11-04 DIAGNOSIS — R293 Abnormal posture: Secondary | ICD-10-CM | POA: Diagnosis not present

## 2016-11-04 DIAGNOSIS — M6281 Muscle weakness (generalized): Secondary | ICD-10-CM

## 2016-11-04 DIAGNOSIS — M546 Pain in thoracic spine: Secondary | ICD-10-CM

## 2016-11-04 NOTE — Therapy (Signed)
[facility] [address]  [place], [place], [address] Phone: [phone]   Fax:  [phone]  Physical Therapy Treatment  Patient Details  Name: Jorge [name] MRN: [id number] Date of Birth: [birth year] Referring Provider: Lovena Neighbours, ANP  Encounter Date: 11/04/2016      PT End of Session - 11/04/16 0835    Visit Number 12   Number of Visits 21   Date for PT Re-Evaluation 11/28/16   PT Start Time 0835   PT Stop Time 0925   PT Time Calculation (min) 50 min   Activity Tolerance Patient tolerated treatment well;No increased pain   Behavior During Therapy WFL for tasks assessed/performed      Past Medical History:  Diagnosis Date  . Anxiety   . Bone metastasis ([place])   . Chest cold 05/19/2016   productive cough  -- started on antibiotic  . Chronic back pain    due to bone mets from myeloma  . Cough   . Depression   . GERD (gastroesophageal reflux disease)   . Hiatal hernia   . History of chicken pox   . History of concussion    age 82 -- no residual  . History of DVT of lower extremity 03/21/2016  treated and completed w/ xarelto   per doppler left extensive occlusion common femoral, femoral, and popliteal veins and right partial occlusion common femoral and profunda femoral veins/  last doppler 06-04-2016 no evidence acute or chronic dvt noted either leg   . History of radiation therapy 06/09/16-06/23/16   lower thoracic spine 25 Gy in 10 fractions  . Mouth ulcers    secondary to radiation  . Multiple myeloma ([place]) dx 02/22/2016 via bone marrow bx---  oncologist-  [name]   IgG Kappa-- Hyperdiploid/ +11 w/ bone mets--  current treatment chemotherapy (started 08/ 2017)and pallitive radiation to back started 06-09-2016  . Renal calculus, right   . Wears contact lenses     Past Surgical History:  Procedure Laterality Date  . COLONOSCOPY  [id number]  . CYSTOSCOPY W/ URETERAL STENT PLACEMENT Right  06/20/2016   Procedure: CYSTOSCOPY WITH STENT REPLACEMENT;  Surgeon: Kathie Rhodes, MD;  Location[facility];  Service: Urology;  Laterality: Right;  . CYSTOSCOPY WITH RETROGRADE PYELOGRAM, URETEROSCOPY AND STENT PLACEMENT Right 05/30/2016   Procedure: CYSTOSCOPY WITH RETROGRADE PYELOGRAM, URETEROSCOPY AND STENT PLACEMENT,DILITATION URETERAL STRICTURE;  Surgeon: Kathie Rhodes, MD;  Location: [facility];  Service: Urology;  Laterality: Right;  . CYSTOSCOPY/RETROGRADE/URETEROSCOPY/STONE EXTRACTION WITH BASKET Right 06/20/2016   Procedure: CYSTOSCOPY/URETEROSCOPY/STONE EXTRACTION WITH BASKET;  Surgeon: Kathie Rhodes, MD;  Location: [facility];  Service: Urology;  Laterality: Right;  . HOLMIUM LASER APPLICATION Right 96/08/8364   Procedure: HOLMIUM LASER APPLICATION;  Surgeon: Kathie Rhodes, MD;  Location: [facility];  Service: Urology;  Laterality: Right;  . IR GENERIC HISTORICAL  02/11/2016   IR RADIOLOGIST EVAL & MGMT 02/11/2016 [facility]  . IR GENERIC HISTORICAL  02/15/2016   IR BONE TUMOR(S)RF ABLATION 02/15/2016 Luanne Bras, MD [facility]  . IR GENERIC HISTORICAL  02/15/2016   IR BONE TUMOR(S)RF ABLATION 02/15/2016 Luanne Bras, MD [facility]  . IR GENERIC HISTORICAL  02/15/2016   IR BONE TUMOR(S)RF ABLATION 02/15/2016 Luanne Bras, MD [facility]  . IR GENERIC HISTORICAL  02/15/2016   IR KYPHO THORACIC WITH BONE BIOPSY 02/15/2016 Luanne Bras, MD [facility]  . IR GENERIC HISTORICAL  02/15/2016   IR KYPHO THORACIC WITH BONE BIOPSY  02/15/2016 Luanne Bras, MD [facility]  . IR GENERIC HISTORICAL  02/15/2016   IR VERTEBROPLASTY CERV/THOR BX INC UNI/BIL INC/INJECT/IMAGING 02/15/2016 Luanne Bras, MD [facility]  . IR GENERIC HISTORICAL  03/13/2016   IR KYPHO EA ADDL LEVEL THORACIC OR LUMBAR 03/13/2016 Luanne Bras, MD [facility]  . IR GENERIC HISTORICAL  03/13/2016   IR KYPHO EA ADDL LEVEL THORACIC OR LUMBAR 03/13/2016 Luanne Bras, MD [facility]  . IR GENERIC HISTORICAL  03/13/2016   IR BONE TUMOR(S)RF ABLATION 03/13/2016 Luanne Bras, MD [facility]  . IR GENERIC HISTORICAL  03/13/2016   IR KYPHO LUMBAR INC FX REDUCE BONE BX UNI/BIL CANNULATION INC/IMAGING 03/13/2016 Luanne Bras, MD [facility]  . IR GENERIC HISTORICAL  03/13/2016   IR BONE TUMOR(S)RF ABLATION 03/13/2016 Luanne Bras, MD [facility]  . IR GENERIC HISTORICAL  03/13/2016   IR BONE TUMOR(S)RF ABLATION 03/13/2016 Luanne Bras, MD [facility]  . IR GENERIC HISTORICAL  03/31/2016   IR RADIOLOGIST EVAL & MGMT 03/31/2016 [facility]  . LAPAROSCOPIC INGUINAL HERNIA REPAIR Bilateral 12-16-2013  dr gross  . RADIOLOGY WITH ANESTHESIA N/A 02/15/2016   Procedure: Spinal Ablation;  Surgeon: Luanne Bras, MD;  Location: [place];  Service: Radiology;  Laterality: N/A;  . RADIOLOGY WITH ANESTHESIA N/A 03/13/2016   Procedure: LUMBER ABLATION;  Surgeon: Luanne Bras, MD;  Location: [place];  Service: Radiology;  Laterality: N/A;  . ROTATOR CUFF REPAIR Right 2003  . TONSILLECTOMY  age 60  . WISDOM TOOTH EXTRACTION      There were no vitals filed for this visit.      Subjective Assessment - 11/04/16 0850    Subjective Pt noting as he becomes more active (walking), that he tends to stiffen up more when he goes to sit down for a period. Stiffness typically resolves once up and moving after ~20 steps.   Pertinent History multiple myeloma with multiple vertebral compression fractures with kyphplasties and vertbuloplasties.  Pt had stem cell tranplant on Feb. 9, 2018 at [facility] and reports that his bloodwork levels are OK.  He has them checked by Dr. Marin Olp Frequently.  He is referred for "outpatient physical therapy for strength and conditioning. Due to extensive vertebral involvement, and compression fractures. avoid all flexion based activity and not lifting> 10 pounds"   Patient Stated Goals be able to strenthen  my back and reduce  pain, increase walking and drive a car without pain    Currently in Pain? Yes   Pain Score 3    Pain Location Back   Pain Orientation Mid;Medial   Pain Descriptors / Indicators Aching   Pain Type Chronic pain                         [facility] Adult PT Treatment/Exercise - 11/04/16 0835      Lumbar Exercises: Stretches   Passive Hamstring Stretch 30 seconds;2 reps   Passive Hamstring Stretch Limitations supine with strap   ITB Stretch 30 seconds;2 reps   ITB Stretch Limitations supine with strap - close monitoring to avoid trunk rotation     Lumbar Exercises: Aerobic   Stationary Bike lvl 3 x 6', lvl 2 x 4'     Lumbar Exercises: Seated   Other Seated Lumbar Exercises seated gentle 3 way prayer stretch with green Pball 2x30" each     Lumbar Exercises: Supine   Clam 20 reps;3 seconds   Clam Limitations 2 sets - 1st set alt hip ABD/ER with  looped blue TB, 2nd set unilateral hip ADD/IR with blue TB   Dead Bug 20 reps;2 seconds   Dead Bug Limitations with crossed red TB   Bridge 20 reps;3 seconds   Bridge Limitations + isometric hip ABD with blue TB, limited lift     Lumbar Exercises: Quadruped   Madcat/Old Horse 10 reps   Madcat/Old Horse Limitations 3" hold   Opposite Arm/Leg Raise Right arm/Left leg;Left arm/Right leg;10 reps;2 seconds   Plank Elbows to knees 3x30"   Other Quadruped Lumbar Exercises Prone rocking into cobra pose 5x10"                     PT Long Term Goals - 10/28/16 1803      PT LONG TERM GOAL #1   Title Pt will report a decrease in pain so that he is able to sit at his computer and work 30 minutes by 10/14/16   Status Achieved  currently tolerating up to 30 minutes in sitting     PT LONG TERM GOAL #2   Title Pt will increase number of repetitions of sit to stand to 14 in 30 seconds to demonstrate and increase in functional strength by 10/14/16   Status Achieved  pt able to complete 16 reps sit to stand in 30 sec     PT LONG  TERM GOAL #3   Title Pt will be independent in a home program of exercise to increase back extension strength and decrease pain by 11/28/16   Status On-going  met for current HEP     PT LONG TERM GOAL #4   Title Pt will report he can drive his car short distances without pain to increase his functional independence by 10/14/16   Status Achieved     PT LONG TERM GOAL #5   Title Pt will report he can drive his car for 03-12 minutes without limitation due to pain to increase his functional independence by 11/28/16   Status On-going     PT LONG TERM GOAL #6   Title Pt will report no increase in pain with transitions in/out car by 11/28/16   Status On-going     PT LONG TERM GOAL #7   Title Pt will be able to walk 5K w/o brace or limitation due to back pain by 11/28/16   Status On-going     PT LONG TERM GOAL #8   Title Pt will tolerate standing in the kitchen for >/= 45 minutes w/o limitation due to back pain to allow for meal prep by 11/28/16   Status On-going           Long Term Clinic Goals - 09/16/16 1705      CC Long Term Goal  #1   Title Pt will report a decrease in pain so that he is able to sit at his computer and work 30 minutes    Time 4   Period Weeks   Status New     CC Long Term Goal  #2   Title Pt will increase number of repetitions of sit to stand to 14 in 30 seconds to demonstrate and increase in functional strength   Time 4   Period Weeks   Status New     CC Long Term Goal  #3   Title Pt will be independent in a home program of exercise to increase back extension strength and decrease pain.   Time 4   Period Weeks     CC  Long Term Goal  #4   Title Pt will report he can drive his car short distances without pain to increase his functional independence    Time 4   Period Weeks   Status New            Plan - 11/04/16 3354    Clinical Impression Statement Pt demonstrating improving tolerance for prone positioning and prone extension activities including  prone modified plank, POE and prone rocking with emphasis on extension. Intoduced gentle seated prayer stretch with Pball to facilitate mid back stretch w/o creating excessive trunk flexion, with pt noting good stretch over area of greatest discomfort with this. Pt deferred taping today, wanting to see how he faired w/o it this week.   Rehab Potential Good   Clinical Impairments Affecting Rehab Potential multiple spinal compression fractures, recovering blood parameters due to stem cell transplant    PT Treatment/Interventions ADLs/Self Care Home Management;Patient/family education;Energy conservation;Gait training;Functional mobility training;Therapeutic activities;Therapeutic exercise;Balance training;Neuromuscular re-education;Moist Heat;Electrical Stimulation;Cryotherapy;Stair training   PT Next Visit Plan lumbar strengthening/stabilization with extension preference; kinesiotaping for back pain as indicated   Consulted and Agree with Plan of Care Patient      Patient will benefit from skilled therapeutic intervention in order to improve the following deficits and impairments:  Decreased endurance, Decreased knowledge of precautions, Decreased activity tolerance, Decreased strength, Pain, Decreased mobility, Postural dysfunction, Impaired perceived functional ability  Visit Diagnosis: Abnormal posture  Muscle weakness (generalized)  Pain in thoracic spine     Problem List Patient Active Problem List   Diagnosis Date Noted  . Phlebitis or thrombophlebitis of lower extremity 07/21/2016  . Visit for preventive health examination 05/16/2016  . Aphthous ulcer 05/16/2016  . Ureterolithiasis 05/16/2016  . Acute deep vein thrombosis (DVT) of femoral vein of left lower extremity ([place]) 03/21/2016  . Multiple myeloma ([place]) 02/22/2016  . Multiple myeloma not having achieved remission ([place]) 02/22/2016  . LBP (low back pain) 11/02/2013  . Bilateral inguinal hernia (BIH), s/p lap repair 12/16/2013  11/02/2013    Percival Spanish, PT, MPT 11/04/2016, 9:36 AM  [facility] [address]  [place], [place], [address] Phone: [phone]   Fax:  [phone]  Name: Jorge [name] MRN: [id number] Date of Birth: 01-Feb-1956

## 2016-11-06 ENCOUNTER — Ambulatory Visit (HOSPITAL_BASED_OUTPATIENT_CLINIC_OR_DEPARTMENT_OTHER): Payer: 59 | Admitting: Family

## 2016-11-06 ENCOUNTER — Ambulatory Visit (HOSPITAL_BASED_OUTPATIENT_CLINIC_OR_DEPARTMENT_OTHER): Payer: 59

## 2016-11-06 ENCOUNTER — Ambulatory Visit: Payer: 59 | Admitting: Physical Therapy

## 2016-11-06 ENCOUNTER — Other Ambulatory Visit (HOSPITAL_BASED_OUTPATIENT_CLINIC_OR_DEPARTMENT_OTHER): Payer: 59

## 2016-11-06 VITALS — BP 107/65 | HR 68

## 2016-11-06 VITALS — BP 92/59 | HR 83 | Temp 98.1°F | Resp 18 | Wt 162.4 lb

## 2016-11-06 DIAGNOSIS — M545 Low back pain, unspecified: Secondary | ICD-10-CM

## 2016-11-06 DIAGNOSIS — M546 Pain in thoracic spine: Secondary | ICD-10-CM

## 2016-11-06 DIAGNOSIS — C9000 Multiple myeloma not having achieved remission: Secondary | ICD-10-CM | POA: Diagnosis not present

## 2016-11-06 DIAGNOSIS — D472 Monoclonal gammopathy: Secondary | ICD-10-CM

## 2016-11-06 DIAGNOSIS — R05 Cough: Secondary | ICD-10-CM

## 2016-11-06 DIAGNOSIS — R293 Abnormal posture: Secondary | ICD-10-CM

## 2016-11-06 DIAGNOSIS — G8929 Other chronic pain: Secondary | ICD-10-CM

## 2016-11-06 DIAGNOSIS — E860 Dehydration: Secondary | ICD-10-CM | POA: Diagnosis not present

## 2016-11-06 DIAGNOSIS — M6281 Muscle weakness (generalized): Secondary | ICD-10-CM

## 2016-11-06 LAB — CMP (CANCER CENTER ONLY)
ALBUMIN: 3.5 g/dL (ref 3.3–5.5)
ALT(SGPT): 27 U/L (ref 10–47)
AST: 34 U/L (ref 11–38)
Alkaline Phosphatase: 77 U/L (ref 26–84)
BILIRUBIN TOTAL: 0.6 mg/dL (ref 0.20–1.60)
BUN: 11 mg/dL (ref 7–22)
CO2: 24 mEq/L (ref 18–33)
CREATININE: 0.9 mg/dL (ref 0.6–1.2)
Calcium: 9.4 mg/dL (ref 8.0–10.3)
Chloride: 109 mEq/L — ABNORMAL HIGH (ref 98–108)
Glucose, Bld: 109 mg/dL (ref 73–118)
Potassium: 4 mEq/L (ref 3.3–4.7)
SODIUM: 141 meq/L (ref 128–145)
Total Protein: 6.1 g/dL — ABNORMAL LOW (ref 6.4–8.1)

## 2016-11-06 LAB — CBC WITH DIFFERENTIAL (CANCER CENTER ONLY)
BASO#: 0 10*3/uL (ref 0.0–0.2)
BASO%: 0.3 % (ref 0.0–2.0)
EOS%: 2.8 % (ref 0.0–7.0)
Eosinophils Absolute: 0.1 10*3/uL (ref 0.0–0.5)
HCT: 40 % (ref 38.7–49.9)
HEMOGLOBIN: 13.4 g/dL (ref 13.0–17.1)
LYMPH#: 0.8 10*3/uL — ABNORMAL LOW (ref 0.9–3.3)
LYMPH%: 20.4 % (ref 14.0–48.0)
MCH: 31.3 pg (ref 28.0–33.4)
MCHC: 33.5 g/dL (ref 32.0–35.9)
MCV: 94 fL (ref 82–98)
MONO#: 0.5 10*3/uL (ref 0.1–0.9)
MONO%: 13.8 % — AB (ref 0.0–13.0)
NEUT%: 62.7 % (ref 40.0–80.0)
NEUTROS ABS: 2.5 10*3/uL (ref 1.5–6.5)
PLATELETS: 132 10*3/uL — AB (ref 145–400)
RBC: 4.28 10*6/uL (ref 4.20–5.70)
RDW: 15.5 % (ref 11.1–15.7)
WBC: 3.9 10*3/uL — AB (ref 4.0–10.0)

## 2016-11-06 LAB — LACTATE DEHYDROGENASE: LDH: 180 U/L (ref 125–245)

## 2016-11-06 MED ORDER — ZOLEDRONIC ACID 4 MG/100ML IV SOLN
4.0000 mg | Freq: Once | INTRAVENOUS | Status: AC
Start: 2016-11-06 — End: 2016-11-06
  Administered 2016-11-06: 4 mg via INTRAVENOUS
  Filled 2016-11-06: qty 100

## 2016-11-06 MED ORDER — SODIUM CHLORIDE 0.9 % IV SOLN
Freq: Once | INTRAVENOUS | Status: AC
  Administered 2016-11-06: 10:00:00 via INTRAVENOUS

## 2016-11-06 NOTE — Therapy (Signed)
[facility] [address]  [place], [place], [address] Phone: [phone]   Fax:  [phone]  Physical Therapy Treatment  Patient Details  Name: Jorge [name] MRN: [id number] Date of Birth: 1956-07-14 Referring Provider: Lovena Neighbours, ANP  Encounter Date: 11/06/2016      PT End of Session - 11/06/16 0804    Visit Number 13   Number of Visits 21   Date for PT Re-Evaluation 11/28/16   PT Start Time 0804   PT Stop Time 2094   PT Time Calculation (min) 40 min   Activity Tolerance Patient tolerated treatment well;No increased pain   Behavior During Therapy WFL for tasks assessed/performed      Past Medical History:  Diagnosis Date  . Anxiety   . Bone metastasis ([place])   . Chest cold 05/19/2016   productive cough  -- started on antibiotic  . Chronic back pain    due to bone mets from myeloma  . Cough   . Depression   . GERD (gastroesophageal reflux disease)   . Hiatal hernia   . History of chicken pox   . History of concussion    age 42 -- no residual  . History of DVT of lower extremity 03/21/2016  treated and completed w/ xarelto   per doppler left extensive occlusion common femoral, femoral, and popliteal veins and right partial occlusion common femoral and profunda femoral veins/  last doppler 06-04-2016 no evidence acute or chronic dvt noted either leg   . History of radiation therapy 06/09/16-06/23/16   lower thoracic spine 25 Gy in 10 fractions  . Mouth ulcers    secondary to radiation  . Multiple myeloma ([place]) dx 02/22/2016 via bone marrow bx---  oncologist-  [name]   IgG Kappa-- Hyperdiploid/ +11 w/ bone mets--  current treatment chemotherapy (started 08/ 2017)and pallitive radiation to back started 06-09-2016  . Renal calculus, right   . Wears contact lenses     Past Surgical History:  Procedure Laterality Date  . COLONOSCOPY  [id number]  . CYSTOSCOPY W/ URETERAL STENT PLACEMENT Right  06/20/2016   Procedure: CYSTOSCOPY WITH STENT REPLACEMENT;  Surgeon: Kathie Rhodes, MD;  Location: [facility];  Service: Urology;  Laterality: Right;  . CYSTOSCOPY WITH RETROGRADE PYELOGRAM, URETEROSCOPY AND STENT PLACEMENT Right 05/30/2016   Procedure: CYSTOSCOPY WITH RETROGRADE PYELOGRAM, URETEROSCOPY AND STENT PLACEMENT,DILITATION URETERAL STRICTURE;  Surgeon: Kathie Rhodes, MD;  [facility];  Service: Urology;  Laterality: Right;  . CYSTOSCOPY/RETROGRADE/URETEROSCOPY/STONE EXTRACTION WITH BASKET Right 06/20/2016   Procedure: CYSTOSCOPY/URETEROSCOPY/STONE EXTRACTION WITH BASKET;  Surgeon: Kathie Rhodes, MD;  Location: [facility];  Service: Urology;  Laterality: Right;  . HOLMIUM LASER APPLICATION Right 70/03/6282   Procedure: HOLMIUM LASER APPLICATION;  Surgeon: Kathie Rhodes, MD;  Location: [facility];  Service: Urology;  Laterality: Right;  . IR GENERIC HISTORICAL  02/11/2016   IR RADIOLOGIST EVAL & MGMT 02/11/2016 [facility]  . IR GENERIC HISTORICAL  02/15/2016   IR BONE TUMOR(S)RF ABLATION 02/15/2016 Luanne Bras, MD [facility]  . IR GENERIC HISTORICAL  02/15/2016   IR BONE TUMOR(S)RF ABLATION 02/15/2016 Luanne Bras, MD [facility]  . IR GENERIC HISTORICAL  02/15/2016   IR BONE TUMOR(S)RF ABLATION 02/15/2016 Luanne Bras, MD [facility]  . IR GENERIC HISTORICAL  02/15/2016   IR KYPHO THORACIC WITH BONE BIOPSY 02/15/2016 Luanne Bras, MD [facility]  . IR GENERIC HISTORICAL  02/15/2016   IR KYPHO THORACIC WITH BONE BIOPSY  02/15/2016 Luanne Bras, MD [facility]  . IR GENERIC HISTORICAL  02/15/2016   IR VERTEBROPLASTY CERV/THOR BX INC UNI/BIL INC/INJECT/IMAGING 02/15/2016 Luanne Bras, MD [facility]  . IR GENERIC HISTORICAL  03/13/2016   IR KYPHO EA ADDL LEVEL THORACIC OR LUMBAR 03/13/2016 Luanne Bras, MD [facility]  . IR GENERIC HISTORICAL  03/13/2016   IR KYPHO EA ADDL LEVEL THORACIC OR LUMBAR 03/13/2016 Luanne Bras, MD [facility]  . IR GENERIC HISTORICAL  03/13/2016   IR BONE TUMOR(S)RF ABLATION 03/13/2016 Luanne Bras, MD [facility]  . IR GENERIC HISTORICAL  03/13/2016   IR KYPHO LUMBAR INC FX REDUCE BONE BX UNI/BIL CANNULATION INC/IMAGING 03/13/2016 Luanne Bras, MD [facility]  . IR GENERIC HISTORICAL  03/13/2016   IR BONE TUMOR(S)RF ABLATION 03/13/2016 Luanne Bras, MD [facility]  . IR GENERIC HISTORICAL  03/13/2016   IR BONE TUMOR(S)RF ABLATION 03/13/2016 Luanne Bras, MD [facility]  . IR GENERIC HISTORICAL  03/31/2016   IR RADIOLOGIST EVAL & MGMT 03/31/2016 [facility]  . LAPAROSCOPIC INGUINAL HERNIA REPAIR Bilateral 12-16-2013  dr gross  . RADIOLOGY WITH ANESTHESIA N/A 02/15/2016   Procedure: Spinal Ablation;  Surgeon: Luanne Bras, MD;  Location: [place];  Service: Radiology;  Laterality: N/A;  . RADIOLOGY WITH ANESTHESIA N/A 03/13/2016   Procedure: LUMBER ABLATION;  Surgeon: Luanne Bras, MD;  Location: [place];  Service: Radiology;  Laterality: N/A;  . ROTATOR CUFF REPAIR Right 2003  . TONSILLECTOMY  age 61  . WISDOM TOOTH EXTRACTION      There were no vitals filed for this visit.      Subjective Assessment - 11/06/16 0806    Subjective Pt noting back feeling more tired and tight this week w/o taping last visit. Also noted ribs are bothering him more due to coughing from pollen/allergies.   Pertinent History multiple myeloma with multiple vertebral compression fractures with kyphplasties and vertbuloplasties.  Pt had stem cell tranplant on Feb. 9, 2018 at [facility] and reports that his bloodwork levels are OK.  He has them checked by Dr. Marin Olp Frequently.  He is referred for "outpatient physical therapy for strength and conditioning. Due to extensive vertebral involvement, and compression fractures. avoid all flexion based activity and not lifting> 10 pounds"   Patient Stated Goals be able to strenthen  my back and reduce pain, increase walking and drive  a car without pain    Currently in Pain? Yes   Pain Score 3    Pain Location Back  & lower ribs   Pain Orientation Mid;Medial   Pain Descriptors / Indicators Aching   Pain Type Chronic pain                         [facility] Adult PT Treatment/Exercise - 11/06/16 0804      Lumbar Exercises: Aerobic   Stationary Bike lvl 3 x 6'     Lumbar Exercises: Machines for Strengthening   Other Lumbar Machine Exercise BATCA low row 25# x15, single arm row 15# x10 each     Lumbar Exercises: Seated   Hip Flexion on Ball Both;15 reps   Hip Flexion on Ball Limitations + opp shoulder flexion   Other Seated Lumbar Exercises B row & shoulder extension with green TB 20x3', B pallof press 10x3" with double green TB seated on green (65 cm) Pball; cues to maintain neutral pelvis position     Lumbar Exercises: Prone   Other Prone Lumbar Exercises Prone over green Pball -  I's, T's & Y's with slight thoracic extension 1# x10     Knee/Hip Exercises: Standing   Hip Flexion Both;15 reps;Knee straight   Hip Flexion Limitations green TB with 3" hold, 1 pole support   Hip ADduction Both;15 reps   Hip ADduction Limitations green TB with 3" hold, 1 pole support   Hip Abduction Both;15 reps;Knee straight   Abduction Limitations green TB with 3" hold, 1 pole support   Hip Extension Both;15 reps;Knee straight   Extension Limitations green TB with 3" hold, 1 pole support     Manual Therapy   Manual Therapy Taping   Kinesiotex Inhibit Muscle     Kinesiotix   Inhibit Muscle  2 parallel "I" strips 30% along paraspinals from lower lumbar to upper thoracic spine, peripendicular "I" strip 50% at area of greatest pain/muscle tension (lower thoracic spine at distal angle of scapula)                     PT Long Term Goals - 10/28/16 1803      PT LONG TERM GOAL #1   Title Pt will report a decrease in pain so that he is able to sit at his computer and work 30 minutes by 10/14/16   Status  Achieved  currently tolerating up to 30 minutes in sitting     PT LONG TERM GOAL #2   Title Pt will increase number of repetitions of sit to stand to 14 in 30 seconds to demonstrate and increase in functional strength by 10/14/16   Status Achieved  pt able to complete 16 reps sit to stand in 30 sec     PT LONG TERM GOAL #3   Title Pt will be independent in a home program of exercise to increase back extension strength and decrease pain by 11/28/16   Status On-going  met for current HEP     PT LONG TERM GOAL #4   Title Pt will report he can drive his car short distances without pain to increase his functional independence by 10/14/16   Status Achieved     PT LONG TERM GOAL #5   Title Pt will report he can drive his car for 97-35 minutes without limitation due to pain to increase his functional independence by 11/28/16   Status On-going     PT LONG TERM GOAL #6   Title Pt will report no increase in pain with transitions in/out car by 11/28/16   Status On-going     PT LONG TERM GOAL #7   Title Pt will be able to walk 5K w/o brace or limitation due to back pain by 11/28/16   Status On-going     PT LONG TERM GOAL #8   Title Pt will tolerate standing in the kitchen for >/= 45 minutes w/o limitation due to back pain to allow for meal prep by 11/28/16   Status On-going           Long Term Clinic Goals - 09/16/16 1705      CC Long Term Goal  #1   Title Pt will report a decrease in pain so that he is able to sit at his computer and work 30 minutes    Time 4   Period Weeks   Status New     CC Long Term Goal  #2   Title Pt will increase number of repetitions of sit to stand to 14 in 30 seconds to demonstrate and increase in functional strength  Time 4   Period Weeks   Status New     CC Long Term Goal  #3   Title Pt will be independent in a home program of exercise to increase back extension strength and decrease pain.   Time 4   Period Weeks     CC Long Term Goal  #4   Title Pt  will report he can drive his car short distances without pain to increase his functional independence    Time 4   Period Weeks   Status New            Plan - 11/06/16 0808    Clinical Impression Statement Pt requesting review of proper technique with prone Pball exercises, with pt requiring cueing to place Pball lower on torso to avoid excessive strain on neck. Pt continuing to tolerate progression of resistance and reps with exercises noting some fatigue, but no increased pain. Pt continues to have good potential to benefit from PT for core/lumbar strengthening and stabilization. Pt noting increased tightness/fatigue w/o tape this week, therefore taping reapplied today.   Rehab Potential Good   Clinical Impairments Affecting Rehab Potential multiple spinal compression fractures, recovering blood parameters due to stem cell transplant    PT Treatment/Interventions ADLs/Self Care Home Management;Patient/family education;Energy conservation;Gait training;Functional mobility training;Therapeutic activities;Therapeutic exercise;Balance training;Neuromuscular re-education;Moist Heat;Electrical Stimulation;Cryotherapy;Stair training   PT Next Visit Plan lumbar strengthening/stabilization with extension preference; kinesiotaping for back pain as indicated   Consulted and Agree with Plan of Care Patient      Patient will benefit from skilled therapeutic intervention in order to improve the following deficits and impairments:  Decreased endurance, Decreased knowledge of precautions, Decreased activity tolerance, Decreased strength, Pain, Decreased mobility, Postural dysfunction, Impaired perceived functional ability  Visit Diagnosis: Abnormal posture  Muscle weakness (generalized)  Pain in thoracic spine     Problem List Patient Active Problem List   Diagnosis Date Noted  . Phlebitis or thrombophlebitis of lower extremity 07/21/2016  . Visit for preventive health examination 05/16/2016   . Aphthous ulcer 05/16/2016  . Ureterolithiasis 05/16/2016  . Acute deep vein thrombosis (DVT) of femoral vein of left lower extremity ([place]) 03/21/2016  . Multiple myeloma ([place]) 02/22/2016  . Multiple myeloma not having achieved remission ([place]) 02/22/2016  . LBP (low back pain) 11/02/2013  . Bilateral inguinal hernia (BIH), s/p lap repair 12/16/2013 11/02/2013    Percival Spanish, PT, MPT 11/06/2016, 8:57 AM  [facility] [address]  [place], [place], [address] Phone: [phone]   Fax:  [phone]  Name: Timmey Lamba MRN: [id number] Date of Birth: [birth year]

## 2016-11-06 NOTE — Progress Notes (Signed)
Hematology and Oncology Follow Up Visit  Jorge [name] [id number] 08/13/1955 61 y.o. 11/06/2016   Principle Diagnosis:  IgG Kappa myeloma - Hyperdiploid/+11 DVT of the LEFT and RIGHT leg  Current Therapy:   Autologous stem cell transplant at [facility] - infused on 08/22/2016 Zometa 4 mg IV monthly - started today 11/06/2016 Xarelto 20 mg PO daily   Interim History:  Jorge [name] is here today with his sweet wife for follow-up. He had his autologous transplant in February and has done well.  His appetite is improving and he has gained 4.6 lbs since his last visit. He states that he feels a little dehydrated today and BP is mildly down at 92/59, HR 83. We will give him 1/2 liter of fluids along with Zometa today. He will try to drink more.  He has some sinus drainage and dry cough with the weather change and his voice is a little hoarse. Lung sounds are clear throughout. If he develops a SOB and/orproductive cough he will let us know immediately.  He continues to do well on Xarelto. No episodes of bleeding, bruising or petechiae.  So far, he has had no fever, chills, n/v, rash, dizziness, chest pain, palpitations, abdominal pain or changes in bowel or bladder habits.  No lymphadenopathy. No falls or episodes of syncope.  No swelling, tenderness or numbness in his extremities. He occasionally has tingling in his left foot.  He is still having back discomfort and states that this has improved somewhat with PT. He states that he will likely be having another vertebroplasty at T9-T10 in the near future. He is followed closely by Dr. Hetty Blend.  He states that his next follow-up with [facility] and Dr. Laverta Baltimore will be on May 9th. We drew labs for that appointment today.   ECOG Performance Status: 1 - Symptomatic but completely ambulatory  Medications:  Allergies as of 11/06/2016   No Known Allergies     Medication List       Accurate as of 11/06/16 10:42 PM. Always use your most recent med list.          calcium carbonate 500 MG chewable tablet Commonly known as:  TUMS - dosed in mg elemental calcium Chew 1 tablet by mouth as needed for indigestion or heartburn.   carisoprodol 350 MG tablet Commonly known as:  SOMA Take 1 tablet (350 mg total) by mouth 4 (four) times daily as needed for muscle spasms.   cyclobenzaprine 10 MG tablet Commonly known as:  FLEXERIL Take 10 mg by mouth as needed.   famciclovir 500 MG tablet Commonly known as:  FAMVIR Take 1 tablet (500 mg total) by mouth daily.   fentaNYL 50 MCG/HR Commonly known as:  DURAGESIC - dosed mcg/hr Place 1 patch (50 mcg total) onto the skin every other day.   HYDROcodone-acetaminophen 10-325 MG tablet Commonly known as:  NORCO Take 0.5-1 tablets by mouth every 8 (eight) hours as needed for moderate pain.   LORazepam 0.5 MG tablet Commonly known as:  ATIVAN Take 1 tablet (0.5 mg total) by mouth every 6 (six) hours as needed (Nausea or vomiting).   magnesium oxide 400 MG tablet Commonly known as:  MAG-OX Take 400 mg by mouth daily.   mometasone 0.1 % cream Commonly known as:  ELOCON Apply topically daily.   ondansetron 8 MG disintegrating tablet Commonly known as:  ZOFRAN-ODT Take 1 tablet (8 mg total) by mouth every 8 (eight) hours as needed.   Oxycodone HCl 10 MG Tabs Take 1  tablet (10 mg total) by mouth every 4 (four) hours as needed.   polyethylene glycol powder powder Commonly known as:  MIRALAX Take 17 g by mouth daily.   PROBIOTIC PO Take 1 capsule by mouth daily.   prochlorperazine 10 MG tablet Commonly known as:  COMPAZINE Take 1 tablet (10 mg total) by mouth every 6 (six) hours as needed for nausea or vomiting.   ranitidine 150 MG tablet Commonly known as:  ZANTAC Take 150 mg by mouth as needed for heartburn.   rivaroxaban 20 MG Tabs tablet Commonly known as:  XARELTO Take 1 tablet (20 mg total) by mouth daily with supper.   senna 8.6 MG Tabs tablet Commonly known as:  SENOKOT Take 2  tablets (17.2 mg total) by mouth daily.   sertraline 100 MG tablet Commonly known as:  ZOLOFT Take 100 mg by mouth at bedtime.   Vitamin D-3 5000 units Tabs Take 5,000 mg by mouth daily.   zolpidem 10 MG tablet Commonly known as:  AMBIEN Take 1 tablet (10 mg total) by mouth at bedtime as needed.       Allergies: No Known Allergies  Past Medical History, Surgical history, Social history, and Family History were reviewed and updated.  Review of Systems: All other 10 point review of systems is negative.   Physical Exam:  weight is 162 lb 6.4 oz (73.7 kg). His oral temperature is 98.1 F (36.7 C). His blood pressure is 92/59 (abnormal) and his pulse is 83. His respiration is 18 and oxygen saturation is 96%.   Wt Readings from Last 3 Encounters:  11/06/16 162 lb 6.4 oz (73.7 kg)  10/10/16 158 lb (71.7 kg)  09/19/16 158 lb (71.7 kg)    Ocular: Sclerae unicteric, pupils equal, round and reactive to light Ear-nose-throat: Oropharynx clear, dentition fair Lymphatic: No cervical, supraclavicular or axillary adenopathy Lungs no rales or rhonchi, good excursion bilaterally Heart regular rate and rhythm, no murmur appreciated Abd soft, nontender, positive bowel sounds, no liver or spleen tip palpated on exam, no fluid wave MSK no focal spinal tenderness, no joint edema Neuro: non-focal, well-oriented, appropriate affect Breasts: Deferred   Lab Results  Component Value Date   WBC 3.9 (L) 11/06/2016   HGB 13.4 11/06/2016   HCT 40.0 11/06/2016   MCV 94 11/06/2016   PLT 132 (L) 11/06/2016   No results found for: FERRITIN, IRON, TIBC, UIBC, IRONPCTSAT Lab Results  Component Value Date   RBC 4.28 11/06/2016   Lab Results  Component Value Date   KAPLAMBRATIO 2.29 (H) 09/15/2016   Lab Results  Component Value Date   IGGSERUM 543 (L) 09/15/2016   IGMSERUM 21 09/15/2016   Lab Results  Component Value Date   MSPIKE 0.5 (H) 07/28/2016     Chemistry      Component Value  Date/Time   NA 141 11/06/2016 0854   NA 140 10/03/2016 0845   K 4.0 11/06/2016 0854   K 4.3 10/03/2016 0845   CL 109 (H) 11/06/2016 0854   CO2 24 11/06/2016 0854   CO2 26 10/03/2016 0845   BUN 11 11/06/2016 0854   BUN 11.9 10/03/2016 0845   CREATININE 0.9 11/06/2016 0854   CREATININE 0.9 10/03/2016 0845      Component Value Date/Time   CALCIUM 9.4 11/06/2016 0854   CALCIUM 9.9 10/03/2016 0845   ALKPHOS 77 11/06/2016 0854   ALKPHOS 80 10/03/2016 0845   AST 34 11/06/2016 0854   AST 28 10/03/2016 0845   ALT 27  11/06/2016 0854   ALT 26 10/03/2016 0845   BILITOT 0.60 11/06/2016 0854   BILITOT 0.48 10/03/2016 0845      Impression and Plan: Mr. Ojeda is a very pleasant 61 yo caucasian male with IgG kappa myeloma and autologous stem cell transplant in February 2018. He is done well so far. Hgb is stable at 13.4, WBC count is 3.9 and platelet count 132. Myeloma studies drawn for [facility] today are pending.  His back pain is slowly improving with PT but patient will likely need another vertebroplasty at T9-T10 soon.  We will proceed with starting Zometa today as planned. We will also give him 1/2 liter of fluids today for dehydration.  He follows up with Dr. Laverta Baltimore on May 9th. We will plan to see him back in 1 month for repeat lab work, follow-up and infusion.  I spent 25 minutes face to face with the patient and his wife with greater that 50% counseling and coordinating care. All questions were answered.  They will contact our office with any other questions or concerns. We can certainly see him sooner if need be.   Eliezer Bottom, NP 4/26/201810:42 PM

## 2016-11-06 NOTE — Patient Instructions (Signed)

## 2016-11-07 ENCOUNTER — Ambulatory Visit: Payer: 59 | Admitting: Hematology & Oncology

## 2016-11-07 ENCOUNTER — Other Ambulatory Visit: Payer: 59

## 2016-11-07 ENCOUNTER — Ambulatory Visit: Payer: 59

## 2016-11-07 DIAGNOSIS — S22060G Wedge compression fracture of T7-T8 vertebra, subsequent encounter for fracture with delayed healing: Secondary | ICD-10-CM | POA: Diagnosis not present

## 2016-11-07 DIAGNOSIS — C9000 Multiple myeloma not having achieved remission: Secondary | ICD-10-CM | POA: Diagnosis not present

## 2016-11-07 DIAGNOSIS — M545 Low back pain: Secondary | ICD-10-CM | POA: Diagnosis not present

## 2016-11-07 LAB — BETA 2 MICROGLOBULIN, SERUM: Beta-2: 2.1 mg/L (ref 0.6–2.4)

## 2016-11-10 ENCOUNTER — Telehealth: Payer: Self-pay | Admitting: *Deleted

## 2016-11-10 NOTE — Telephone Encounter (Signed)
Patient called to let us know he is getting a kyphoplasty on Nov 21, 2016.  Patient is on Xarelto and asked what instructions were.  Dr. Marin Olp notified.  Stop Xarelto on the 9th and restart on May 12th.  Instructions given to patient.

## 2016-11-11 ENCOUNTER — Other Ambulatory Visit: Payer: 59

## 2016-11-11 ENCOUNTER — Ambulatory Visit: Payer: 59 | Attending: Internal Medicine | Admitting: Physical Therapy

## 2016-11-11 DIAGNOSIS — R293 Abnormal posture: Secondary | ICD-10-CM | POA: Insufficient documentation

## 2016-11-11 DIAGNOSIS — M546 Pain in thoracic spine: Secondary | ICD-10-CM | POA: Diagnosis not present

## 2016-11-11 DIAGNOSIS — M6281 Muscle weakness (generalized): Secondary | ICD-10-CM | POA: Insufficient documentation

## 2016-11-11 NOTE — Therapy (Signed)
[facility] [address]  [place], [place], [address] Phone: [phone]   Fax:  [phone]  Physical Therapy Treatment  Patient Details  Name: Jorge [name] MRN: [id number] Date of Birth: 1955-12-02 Referring Provider: Lovena Neighbours, ANP  Encounter Date: 11/11/2016      PT End of Session - 11/11/16 1705    Visit Number 14   Number of Visits 21   Date for PT Re-Evaluation 11/28/16   PT Start Time 0076   PT Stop Time 2263   PT Time Calculation (min) 43 min   Activity Tolerance Patient tolerated treatment well;No increased pain   Behavior During Therapy WFL for tasks assessed/performed      Past Medical History:  Diagnosis Date  . Anxiety   . Bone metastasis ([place])   . Chest cold 05/19/2016   productive cough  -- started on antibiotic  . Chronic back pain    due to bone mets from myeloma  . Cough   . Depression   . GERD (gastroesophageal reflux disease)   . Hiatal hernia   . History of chicken pox   . History of concussion    age 54 -- no residual  . History of DVT of lower extremity 03/21/2016  treated and completed w/ xarelto   per doppler left extensive occlusion common femoral, femoral, and popliteal veins and right partial occlusion common femoral and profunda femoral veins/  last doppler 06-04-2016 no evidence acute or chronic dvt noted either leg   . History of radiation therapy 06/09/16-06/23/16   lower thoracic spine 25 Gy in 10 fractions  . Mouth ulcers    secondary to radiation  . Multiple myeloma ([place]) dx 02/22/2016 via bone marrow bx---  oncologist-  [name]   IgG Kappa-- Hyperdiploid/ +11 w/ bone mets--  current treatment chemotherapy (started 08/ 2017)and pallitive radiation to back started 06-09-2016  . Renal calculus, right   . Wears contact lenses     Past Surgical History:  Procedure Laterality Date  . COLONOSCOPY  [id number]  . CYSTOSCOPY W/ URETERAL STENT PLACEMENT Right  06/20/2016   Procedure: CYSTOSCOPY WITH STENT REPLACEMENT;  Surgeon: Kathie Rhodes, MD;  Location: [facility];  Service: Urology;  Laterality: Right;  . CYSTOSCOPY WITH RETROGRADE PYELOGRAM, URETEROSCOPY AND STENT PLACEMENT Right 05/30/2016   Procedure: CYSTOSCOPY WITH RETROGRADE PYELOGRAM, URETEROSCOPY AND STENT PLACEMENT,DILITATION URETERAL STRICTURE;  Surgeon: Kathie Rhodes, MD;  Location: [facility];  Service: Urology;  Laterality: Right;  . CYSTOSCOPY/RETROGRADE/URETEROSCOPY/STONE EXTRACTION WITH BASKET Right 06/20/2016   Procedure: CYSTOSCOPY/URETEROSCOPY/STONE EXTRACTION WITH BASKET;  Surgeon: Kathie Rhodes, MD;  Location: [facility];  Service: Urology;  Laterality: Right;  . HOLMIUM LASER APPLICATION Right 33/11/4560   Procedure: HOLMIUM LASER APPLICATION;  Surgeon: Kathie Rhodes, MD;  Location: [facility];  Service: Urology;  Laterality: Right;  . IR GENERIC HISTORICAL  02/11/2016   IR RADIOLOGIST EVAL & MGMT 02/11/2016 [facility]  . IR GENERIC HISTORICAL  02/15/2016   IR BONE TUMOR(S)RF ABLATION 02/15/2016 Luanne Bras, MD [facility]  . IR GENERIC HISTORICAL  02/15/2016   IR BONE TUMOR(S)RF ABLATION 02/15/2016 Luanne Bras, MD [facility]  . IR GENERIC HISTORICAL  02/15/2016   IR BONE TUMOR(S)RF ABLATION 02/15/2016 Luanne Bras, MD [facility]  . IR GENERIC HISTORICAL  02/15/2016   IR KYPHO THORACIC WITH BONE BIOPSY 02/15/2016 Luanne Bras, MD [facility]  . IR GENERIC HISTORICAL  02/15/2016   IR KYPHO THORACIC WITH BONE BIOPSY  02/15/2016 Luanne Bras, MD [facility]  . IR GENERIC HISTORICAL  02/15/2016   IR VERTEBROPLASTY CERV/THOR BX INC UNI/BIL INC/INJECT/IMAGING 02/15/2016 Luanne Bras, MD [facility]  . IR GENERIC HISTORICAL  03/13/2016   IR KYPHO EA ADDL LEVEL THORACIC OR LUMBAR 03/13/2016 Luanne Bras, MD [facility]  . IR GENERIC HISTORICAL  03/13/2016   IR KYPHO EA ADDL LEVEL THORACIC OR LUMBAR 03/13/2016 Luanne Bras, MD [facility]  . IR GENERIC HISTORICAL  03/13/2016   IR BONE TUMOR(S)RF ABLATION 03/13/2016 Luanne Bras, MD [facility]  . IR GENERIC HISTORICAL  03/13/2016   IR KYPHO LUMBAR INC FX REDUCE BONE BX UNI/BIL CANNULATION INC/IMAGING 03/13/2016 Luanne Bras, MD [facility]  . IR GENERIC HISTORICAL  03/13/2016   IR BONE TUMOR(S)RF ABLATION 03/13/2016 Luanne Bras, MD [facility]  . IR GENERIC HISTORICAL  03/13/2016   IR BONE TUMOR(S)RF ABLATION 03/13/2016 Luanne Bras, MD [facility]  . IR GENERIC HISTORICAL  03/31/2016   IR RADIOLOGIST EVAL & MGMT 03/31/2016 [facility]  . LAPAROSCOPIC INGUINAL HERNIA REPAIR Bilateral 12-16-2013  dr gross  . RADIOLOGY WITH ANESTHESIA N/A 02/15/2016   Procedure: Spinal Ablation;  Surgeon: Luanne Bras, MD;  Location: [facility];  Service: Radiology;  Laterality: N/A;  . RADIOLOGY WITH ANESTHESIA N/A 03/13/2016   Procedure: LUMBER ABLATION;  Surgeon: Luanne Bras, MD;  Location: [facility];  Service: Radiology;  Laterality: N/A;  . ROTATOR CUFF REPAIR Right 2003  . TONSILLECTOMY  age 49  . WISDOM TOOTH EXTRACTION      There were no vitals filed for this visit.      Subjective Assessment - 11/11/16 1714    Subjective Pt feels like he is starting to hit a plateau. Noting increased fatigue/tiredness since starting training with his service dog. States MD wants to do 2 more kyphoplasties at T6 & T8, with procedure tentatively scheduled for 5/11.   Pertinent History multiple myeloma with multiple vertebral compression fractures with kyphplasties and vertbuloplasties.  Pt had stem cell tranplant on Feb. 9, 2018 at [facility] and reports that his bloodwork levels are OK.  He has them checked by Dr. Marin Olp Frequently.  He is referred for "outpatient physical therapy for strength and conditioning. Due to extensive vertebral involvement, and compression fractures. avoid all flexion based activity and not lifting> 10 pounds"   Patient Stated Goals  be able to strenthen my back and reduce pain, increase walking and drive a car without pain    Currently in Pain? Yes   Pain Score 4    Pain Location Back   Pain Orientation Mid;Medial                         [facility] Adult PT Treatment/Exercise - 11/11/16 1705      Lumbar Exercises: Stretches   Prone on Elbows Stretch 60 seconds;2 reps     Lumbar Exercises: Aerobic   Stationary Bike lvl 2 x 7'     Lumbar Exercises: Standing   Wall Slides 15 reps;3 seconds   Wall Slides Limitations on green Pball on wall   Row Both;15 reps;Theraband   Theraband Level (Row) Level 4 (Blue)   Row Limitations staggered stance, 3" hold   Shoulder Extension Both;15 reps;Theraband   Theraband Level (Shoulder Extension) Level 4 (Blue)   Shoulder Extension Limitations to neutral + scap retraction, 3" hold     Lumbar Exercises: Supine   Dead Bug 20 reps;2 seconds   Dead Bug Limitations with crossed red  TB     Lumbar Exercises: Quadruped   Opposite Arm/Leg Raise Right arm/Left leg;Left arm/Right leg;10 reps;2 seconds   Plank Elbows to knees 3x30"   Other Quadruped Lumbar Exercises Prone rocking into cobra pose 5x10"     Manual Therapy   Manual Therapy Taping   Kinesiotex Inhibit Muscle     Kinesiotix   Inhibit Muscle  2 parallel "I" strips 30% along paraspinals from mid lumbar to upper thoracic spine, peripendicular "I" strip 50% at area of greatest pain/muscle tension (lower thoracic spine at distal angle of scapula)                     PT Long Term Goals - 10/28/16 1803      PT LONG TERM GOAL #1   Title Pt will report a decrease in pain so that he is able to sit at his computer and work 30 minutes by 10/14/16   Status Achieved  currently tolerating up to 30 minutes in sitting     PT LONG TERM GOAL #2   Title Pt will increase number of repetitions of sit to stand to 14 in 30 seconds to demonstrate and increase in functional strength by 10/14/16   Status Achieved  pt  able to complete 16 reps sit to stand in 30 sec     PT LONG TERM GOAL #3   Title Pt will be independent in a home program of exercise to increase back extension strength and decrease pain by 11/28/16   Status On-going  met for current HEP     PT LONG TERM GOAL #4   Title Pt will report he can drive his car short distances without pain to increase his functional independence by 10/14/16   Status Achieved     PT LONG TERM GOAL #5   Title Pt will report he can drive his car for 89-02 minutes without limitation due to pain to increase his functional independence by 11/28/16   Status On-going     PT LONG TERM GOAL #6   Title Pt will report no increase in pain with transitions in/out car by 11/28/16   Status On-going     PT LONG TERM GOAL #7   Title Pt will be able to walk 5K w/o brace or limitation due to back pain by 11/28/16   Status On-going     PT LONG TERM GOAL #8   Title Pt will tolerate standing in the kitchen for >/= 45 minutes w/o limitation due to back pain to allow for meal prep by 11/28/16   Status On-going           Long Term Clinic Goals - 09/16/16 1705      [facility] Long Term Goal  #1   Title Pt will report a decrease in pain so that he is able to sit at his computer and work 30 minutes    Time 4   Period Weeks   Status New     CC Long Term Goal  #2   Title Pt will increase number of repetitions of sit to stand to 14 in 30 seconds to demonstrate and increase in functional strength   Time 4   Period Weeks   Status New     CC Long Term Goal  #3   Title Pt will be independent in a home program of exercise to increase back extension strength and decrease pain.   Time 4   Period Weeks     CC  Long Term Goal  #4   Title Pt will report he can drive his car short distances without pain to increase his functional independence    Time 4   Period Weeks   Status New            Plan - 11/11/16 1752    Clinical Impression Statement Pt reporting MD wants to proceed with  kyphoplasties for T6 & T8 if cleared by the rest of his medical team - will undergo pre-op work-up on Friday 11/14/16 and if cleared, surgery will be on 11/21/16. After surgery, pt will require a break from PT, therefore anticipate next visit will be the last for the time being. Pt noting increased pain today which he attributes to increased activity training his new service dog as well as longer walk he took with the service dog this morning, therefore scaled back in tenity of therapy today and focused on increased extension activities, with pt noting improvement in pain by end of session.   Rehab Potential Good   Clinical Impairments Affecting Rehab Potential multiple spinal compression fractures, recovering blood parameters due to stem cell transplant    PT Treatment/Interventions ADLs/Self Care Home Management;Patient/family education;Energy conservation;Gait training;Functional mobility training;Therapeutic activities;Therapeutic exercise;Balance training;Neuromuscular re-education;Moist Heat;Electrical Stimulation;Cryotherapy;Stair training   PT Next Visit Plan lumbar strengthening/stabilization with extension preference; kinesiotaping for back pain as indicated   Consulted and Agree with Plan of Care Patient      Patient will benefit from skilled therapeutic intervention in order to improve the following deficits and impairments:  Decreased endurance, Decreased knowledge of precautions, Decreased activity tolerance, Decreased strength, Pain, Decreased mobility, Postural dysfunction, Impaired perceived functional ability  Visit Diagnosis: Abnormal posture  Muscle weakness (generalized)  Pain in thoracic spine     Problem List Patient Active Problem List   Diagnosis Date Noted  . Phlebitis or thrombophlebitis of lower extremity 07/21/2016  . Visit for preventive health examination 05/16/2016  . Aphthous ulcer 05/16/2016  . Ureterolithiasis 05/16/2016  . Acute deep vein thrombosis (DVT)  of femoral vein of left lower extremity ([place]) 03/21/2016  . Multiple myeloma ([place]) 02/22/2016  . Multiple myeloma not having achieved remission ([place]) 02/22/2016  . LBP (low back pain) 11/02/2013  . Bilateral inguinal hernia (BIH), s/p lap repair 12/16/2013 11/02/2013    Percival Spanish, PT, MPT 11/11/2016, 6:01 PM  [facility] [address]  [place], [place], [address] Phone: [phone]   Fax:  [phone]  Name: Jorge [name] MRN: [id number] Date of Birth: [birth year]

## 2016-11-14 ENCOUNTER — Other Ambulatory Visit: Payer: 59

## 2016-11-14 ENCOUNTER — Ambulatory Visit: Payer: 59 | Admitting: Physical Therapy

## 2016-11-14 DIAGNOSIS — Z029 Encounter for administrative examinations, unspecified: Secondary | ICD-10-CM | POA: Diagnosis not present

## 2016-11-18 ENCOUNTER — Ambulatory Visit: Payer: 59 | Admitting: Physical Therapy

## 2016-11-18 DIAGNOSIS — R293 Abnormal posture: Secondary | ICD-10-CM | POA: Diagnosis not present

## 2016-11-18 DIAGNOSIS — M546 Pain in thoracic spine: Secondary | ICD-10-CM

## 2016-11-18 DIAGNOSIS — M6281 Muscle weakness (generalized): Secondary | ICD-10-CM

## 2016-11-18 NOTE — Therapy (Signed)
[facility] [address]  [place], [place], [address] Phone: [phone]   Fax:  [phone]  Physical Therapy Treatment  Patient Details  Name: Jorge [name] MRN: [id number] Date of Birth: 20-Mar-1956 Referring Provider: Lovena Neighbours, ANP  Encounter Date: 11/18/2016      PT End of Session - 11/18/16 1535    Visit Number 16   Number of Visits 21   Date for PT Re-Evaluation 11/28/16   PT Start Time 7106   PT Stop Time 2694   PT Time Calculation (min) 29 min   Activity Tolerance Patient tolerated treatment well;No increased pain   Behavior During Therapy WFL for tasks assessed/performed      Past Medical History:  Diagnosis Date  . Anxiety   . Bone metastasis ([place])   . Chest cold 05/19/2016   productive cough  -- started on antibiotic  . Chronic back pain    due to bone mets from myeloma  . Cough   . Depression   . GERD (gastroesophageal reflux disease)   . Hiatal hernia   . History of chicken pox   . History of concussion    age 80 -- no residual  . History of DVT of lower extremity 03/21/2016  treated and completed w/ xarelto   per doppler left extensive occlusion common femoral, femoral, and popliteal veins and right partial occlusion common femoral and profunda femoral veins/  last doppler 06-04-2016 no evidence acute or chronic dvt noted either leg   . History of radiation therapy 06/09/16-06/23/16   lower thoracic spine 25 Gy in 10 fractions  . Mouth ulcers    secondary to radiation  . Multiple myeloma ([place]) dx 02/22/2016 via bone marrow bx---  oncologist-  [name]   IgG Kappa-- Hyperdiploid/ +11 w/ bone mets--  current treatment chemotherapy (started 08/ 2017)and pallitive radiation to back started 06-09-2016  . Renal calculus, right   . Wears contact lenses     Past Surgical History:  Procedure Laterality Date  . COLONOSCOPY  [id number]  . CYSTOSCOPY W/ URETERAL STENT PLACEMENT Right  06/20/2016   Procedure: CYSTOSCOPY WITH STENT REPLACEMENT;  Surgeon: Kathie Rhodes, MD;  Location: [facility]  Service: Urology;  Laterality: Right;  . CYSTOSCOPY WITH RETROGRADE PYELOGRAM, URETEROSCOPY AND STENT PLACEMENT Right 05/30/2016   Procedure: CYSTOSCOPY WITH RETROGRADE PYELOGRAM, URETEROSCOPY AND STENT PLACEMENT,DILITATION URETERAL STRICTURE;  Surgeon: Kathie Rhodes, MD;  Location: [facility];  Service: Urology;  Laterality: Right;  . CYSTOSCOPY/RETROGRADE/URETEROSCOPY/STONE EXTRACTION WITH BASKET Right 06/20/2016   Procedure: CYSTOSCOPY/URETEROSCOPY/STONE EXTRACTION WITH BASKET;  Surgeon: Kathie Rhodes, MD;  Location: [facility];  Service: Urology;  Laterality: Right;  . HOLMIUM LASER APPLICATION Right 85/10/6268   Procedure: HOLMIUM LASER APPLICATION;  Surgeon: Kathie Rhodes, MD;  Location: [facility];  Service: Urology;  Laterality: Right;  . IR GENERIC HISTORICAL  02/11/2016   IR RADIOLOGIST EVAL & MGMT 02/11/2016 [facility]  . IR GENERIC HISTORICAL  02/15/2016   IR BONE TUMOR(S)RF ABLATION 02/15/2016 Luanne Bras, MD [facility]  . IR GENERIC HISTORICAL  02/15/2016   IR BONE TUMOR(S)RF ABLATION 02/15/2016 Luanne Bras, MD [facility]  . IR GENERIC HISTORICAL  02/15/2016   IR BONE TUMOR(S)RF ABLATION 02/15/2016 Luanne Bras, MD [facility]  . IR GENERIC HISTORICAL  02/15/2016   IR KYPHO THORACIC WITH BONE BIOPSY 02/15/2016 Luanne Bras, MD [facility]  . IR GENERIC HISTORICAL  02/15/2016   IR KYPHO THORACIC WITH BONE BIOPSY  02/15/2016 Luanne Bras, MD [facility]  . IR GENERIC HISTORICAL  02/15/2016   IR VERTEBROPLASTY CERV/THOR BX INC UNI/BIL INC/INJECT/IMAGING 02/15/2016 Luanne Bras, MD [facility]  . IR GENERIC HISTORICAL  03/13/2016   IR KYPHO EA ADDL LEVEL THORACIC OR LUMBAR 03/13/2016 Luanne Bras, MD [facility]  . IR GENERIC HISTORICAL  03/13/2016   IR KYPHO EA ADDL LEVEL THORACIC OR LUMBAR 03/13/2016 Luanne Bras, MD [facility]  . IR GENERIC HISTORICAL  03/13/2016   IR BONE TUMOR(S)RF ABLATION 03/13/2016 Luanne Bras, MD [facility]  . IR GENERIC HISTORICAL  03/13/2016   IR KYPHO LUMBAR INC FX REDUCE BONE BX UNI/BIL CANNULATION INC/IMAGING 03/13/2016 Luanne Bras, MD [facility]  . IR GENERIC HISTORICAL  03/13/2016   IR BONE TUMOR(S)RF ABLATION 03/13/2016 Luanne Bras, MD [facility]  . IR GENERIC HISTORICAL  03/13/2016   IR BONE TUMOR(S)RF ABLATION 03/13/2016 Luanne Bras, MD [facility]  . IR GENERIC HISTORICAL  03/31/2016   IR RADIOLOGIST EVAL & MGMT 03/31/2016 [facility]  . LAPAROSCOPIC INGUINAL HERNIA REPAIR Bilateral 12-16-2013  dr gross  . RADIOLOGY WITH ANESTHESIA N/A 02/15/2016   Procedure: Spinal Ablation;  Surgeon: Luanne Bras, MD;  Location: [facility];  Service: Radiology;  Laterality: N/A;  . RADIOLOGY WITH ANESTHESIA N/A 03/13/2016   Procedure: LUMBER ABLATION;  Surgeon: Luanne Bras, MD;  Location: [facility];  Service: Radiology;  Laterality: N/A;  . ROTATOR CUFF REPAIR Right 2003  . TONSILLECTOMY  age 28  . WISDOM TOOTH EXTRACTION      There were no vitals filed for this visit.      Subjective Assessment - 11/18/16 1539    Subjective Pt reporting plan is still for T6 & T8 kyphoplasty on Friday 11/21/16, therefore today will be his last therapy for now. Pt reports MD anticipating ~4 wk of limited activity following procedure.   Pertinent History multiple myeloma with multiple vertebral compression fractures with kyphplasties and vertbuloplasties.  Pt had stem cell tranplant on Feb. 9, 2018 at [facility] and reports that his bloodwork levels are OK.  He has them checked by Dr. Marin Olp Frequently.  He is referred for "outpatient physical therapy for strength and conditioning. Due to extensive vertebral involvement, and compression fractures. avoid all flexion based activity and not lifting> 10 pounds"   Patient Stated Goals be able to strenthen my back and  reduce pain, increase walking and drive a car without pain    Currently in Pain? Yes   Pain Score 2    Pain Location Back   Pain Orientation Mid;Medial;Right   Pain Descriptors / Indicators Aching   Pain Type Chronic pain   Pain Radiating Towards very mild wrapping around lateral ribs            [facility] PT Assessment - 11/18/16 1535      Assessment   Medical Diagnosis recent stem cell transplant; multiple vertebral compression fractures    Onset Date/Surgical Date 08/22/16   Hand Dominance Right     Strength   Right Hip Flexion 4+/5   Right Hip Extension 4-/5   Right Hip External Rotation  4-/5  tested in sitting   Right Hip Internal Rotation 4/5   Right Hip ABduction 4/5   Right Hip ADduction 4/5   Left Hip Flexion 4+/5   Left Hip Extension 4-/5  testing in sitting   Left Hip External Rotation 4-/5   Left Hip Internal Rotation 4/5   Left Hip ABduction 4/5   Left Hip ADduction 4/5  Right Knee Flexion 4/5   Right Knee Extension 4+/5   Left Knee Flexion 4/5   Left Knee Extension 4+/5                     [facility] Adult PT Treatment/Exercise - 11/18/16 1535      Lumbar Exercises: Quadruped   Opposite Arm/Leg Raise Right arm/Left leg;Left arm/Right leg;15 reps   Plank Elbows to toes 2x30"                PT Education - 11/18/16 1615    Education Details current status and exercises to emphasize until surgery   Person(s) Educated Patient   Methods Explanation;Demonstration   Comprehension Verbalized understanding;Returned demonstration             PT Long Term Goals - 11/18/16 1543      PT LONG TERM GOAL #1   Title Pt will report a decrease in pain so that he is able to sit at his computer and work 30 minutes by 10/14/16   Status Achieved     PT LONG TERM GOAL #2   Title Pt will increase number of repetitions of sit to stand to 14 in 30 seconds to demonstrate and increase in functional strength by 10/14/16   Status Achieved     PT LONG  TERM GOAL #3   Title Pt will be independent in a home program of exercise to increase back extension strength and decrease pain by 11/28/16   Status Achieved     PT LONG TERM GOAL #4   Title Pt will report he can drive his car short distances without pain to increase his functional independence by 10/14/16   Status Achieved     PT LONG TERM GOAL #5   Title Pt will report he can drive his car for 97-02 minutes without limitation due to pain to increase his functional independence by 11/28/16   Status Partially Met  pt has not attempted any drives longer than ~63 minutes recently, but can ride for up to 1.5 hrs     PT LONG TERM GOAL #6   Title Pt will report no increase in pain with transitions in/out car by 11/28/16   Status Not Met  still noting slight increase in pain with transition in/out of car, but feels that transition is easier     PT LONG TERM GOAL #7   Title Pt will be able to walk 5K w/o brace or limitation due to back pain by 11/28/16   Status Achieved     PT LONG TERM GOAL #8   Title Pt will tolerate standing in the kitchen for >/= 45 minutes w/o limitation due to back pain to allow for meal prep by 11/28/16   Status Not Met  tolerating 30-40 minutes           Long Term Clinic Goals - 09/16/16 1705      CC Long Term Goal  #1   Title Pt will report a decrease in pain so that he is able to sit at his computer and work 30 minutes    Time 4   Period Weeks   Status New     CC Long Term Goal  #2   Title Pt will increase number of repetitions of sit to stand to 14 in 30 seconds to demonstrate and increase in functional strength   Time 4   Period Weeks   Status New     CC Long Term Goal  #  3   Title Pt will be independent in a home program of exercise to increase back extension strength and decrease pain.   Time 4   Period Weeks     CC Long Term Goal  #4   Title Pt will report he can drive his car short distances without pain to increase his functional independence     Time 4   Period Weeks   Status New            Plan - 11/18/16 1606    Clinical Impression Statement Pt reporting that pre-op assessment has cleared him to proceed with surgery for T6 & T8 kyphoplasties as of Friday 11/21/16 with anticipated 4 week post-op limited activity, therefore completed final assessment today. Pt has demonstrated excellent progress with PT with improved strength, ease of mobility and increased activity tolerance. Pt has met majority of LTGs with remaining goals not met due to premature discharge from PT due to pending surgery. Pending status after recovery period from surgery, pt may benefit from new PT referral to address remaining deficits and any potential decline related to limited activity during recovery period.   Rehab Potential Good   Clinical Impairments Affecting Rehab Potential multiple spinal compression fractures, recovering blood parameters due to stem cell transplant    PT Treatment/Interventions ADLs/Self Care Home Management;Patient/family education;Energy conservation;Gait training;Functional mobility training;Therapeutic activities;Therapeutic exercise;Balance training;Neuromuscular re-education;Moist Heat;Electrical Stimulation;Cryotherapy;Stair training   PT Next Visit Plan Discharge due to pending kyphoplasty on 11/21/16   Consulted and Agree with Plan of Care Patient      Patient will benefit from skilled therapeutic intervention in order to improve the following deficits and impairments:  Decreased endurance, Decreased knowledge of precautions, Decreased activity tolerance, Decreased strength, Pain, Decreased mobility, Postural dysfunction, Impaired perceived functional ability  Visit Diagnosis: Abnormal posture  Muscle weakness (generalized)  Pain in thoracic spine     Problem List Patient Active Problem List   Diagnosis Date Noted  . Phlebitis or thrombophlebitis of lower extremity 07/21/2016  . Visit for preventive health examination  05/16/2016  . Aphthous ulcer 05/16/2016  . Ureterolithiasis 05/16/2016  . Acute deep vein thrombosis (DVT) of femoral vein of left lower extremity ([place]) 03/21/2016  . Multiple myeloma ([place]) 02/22/2016  . Multiple myeloma not having achieved remission ([place]) 02/22/2016  . LBP (low back pain) 11/02/2013  . Bilateral inguinal hernia (BIH), s/p lap repair 12/16/2013 11/02/2013    PHYSICAL THERAPY DISCHARGE SUMMARY  Visits from Start of Care: 16  Current functional level related to goals / functional outcomes:   Refer to above clinical impression.   Remaining deficits:   As above.   Education / Equipment:   HEP, Equities trader Plan: Patient agrees to discharge.  Patient goals were partially met. Patient is being discharged due to a change in medical status.  ?????     Percival Spanish, PT, MPT 11/18/2016, 4:16 PM  [facility] [address]  [place], [place], [address] Phone: [phone]   Fax:  [phone]  Name: Jorge [name] MRN: [id number] Date of Birth: 1956-01-15

## 2016-11-19 DIAGNOSIS — C9000 Multiple myeloma not having achieved remission: Secondary | ICD-10-CM | POA: Diagnosis not present

## 2016-11-21 ENCOUNTER — Other Ambulatory Visit: Payer: 59

## 2016-11-21 ENCOUNTER — Ambulatory Visit: Payer: 59 | Admitting: Physical Therapy

## 2016-11-21 DIAGNOSIS — S22050A Wedge compression fracture of T5-T6 vertebra, initial encounter for closed fracture: Secondary | ICD-10-CM | POA: Diagnosis not present

## 2016-11-21 DIAGNOSIS — C9000 Multiple myeloma not having achieved remission: Secondary | ICD-10-CM | POA: Diagnosis not present

## 2016-11-21 DIAGNOSIS — M4854XA Collapsed vertebra, not elsewhere classified, thoracic region, initial encounter for fracture: Secondary | ICD-10-CM | POA: Diagnosis not present

## 2016-11-21 DIAGNOSIS — S22050G Wedge compression fracture of T5-T6 vertebra, subsequent encounter for fracture with delayed healing: Secondary | ICD-10-CM | POA: Diagnosis not present

## 2016-11-21 DIAGNOSIS — S22060G Wedge compression fracture of T7-T8 vertebra, subsequent encounter for fracture with delayed healing: Secondary | ICD-10-CM | POA: Diagnosis not present

## 2016-11-21 DIAGNOSIS — S22060A Wedge compression fracture of T7-T8 vertebra, initial encounter for closed fracture: Secondary | ICD-10-CM | POA: Diagnosis not present

## 2016-11-21 DIAGNOSIS — K449 Diaphragmatic hernia without obstruction or gangrene: Secondary | ICD-10-CM | POA: Diagnosis not present

## 2016-11-25 ENCOUNTER — Ambulatory Visit: Payer: 59 | Admitting: Physical Therapy

## 2016-11-28 ENCOUNTER — Other Ambulatory Visit: Payer: Self-pay | Admitting: Family

## 2016-11-28 ENCOUNTER — Ambulatory Visit: Payer: 59 | Admitting: Physical Therapy

## 2016-11-28 ENCOUNTER — Ambulatory Visit (HOSPITAL_BASED_OUTPATIENT_CLINIC_OR_DEPARTMENT_OTHER): Payer: 59 | Admitting: Family

## 2016-11-28 ENCOUNTER — Other Ambulatory Visit: Payer: 59

## 2016-11-28 VITALS — BP 110/62 | HR 87 | Temp 98.0°F | Resp 18 | Wt 163.0 lb

## 2016-11-28 DIAGNOSIS — L538 Other specified erythematous conditions: Secondary | ICD-10-CM | POA: Diagnosis not present

## 2016-11-28 DIAGNOSIS — C9000 Multiple myeloma not having achieved remission: Secondary | ICD-10-CM

## 2016-11-28 DIAGNOSIS — Z9884 Bariatric surgery status: Secondary | ICD-10-CM

## 2016-11-28 DIAGNOSIS — M545 Low back pain: Secondary | ICD-10-CM

## 2016-11-28 DIAGNOSIS — W57XXXA Bitten or stung by nonvenomous insect and other nonvenomous arthropods, initial encounter: Secondary | ICD-10-CM | POA: Diagnosis not present

## 2016-11-28 MED ORDER — DOXYCYCLINE HYCLATE 100 MG PO TABS: 200.0000 mg | ORAL_TABLET | Freq: Once | ORAL | 0 refills | Status: AC

## 2016-11-28 NOTE — Progress Notes (Signed)
Hematology and Oncology Follow Up Visit  Jorge [name] [id number] [birth year] 61 y.o. 11/28/2016   Principle Diagnosis:  IgG Kappa myeloma - Hyperdiploid/+11 DVT of the LEFT and RIGHT leg  Current Therapy:   Autologous stem cell transplant at [facility] - infused on 08/22/2016 Zometa 4 mg IV monthly - started today 11/06/2016 Xarelto 20 mg PO daily   Interim History:  Jorge [name] is here today with his wife for an unscheduled visit. He founf a tick on his left outer thigh yesterday after walking his dog. His wife tried to remove it but the head is still embedded in the thigh. I removed there head with tweezers and cleaned the pin sized area thoroughly. There is redness at the site but no "bulls eye."  His wife brought in the body of the tick but is in pieces and I was not able to determine any specific markings. The tick is small and brown in color.  No lymphadenopathy found on exam. No bleeding, bruising or petechiae.  No fever, chills, n/v, cough, ras, dizziness, headache, SOB, chest pain, palpitations, abdominal pain or changes in bowel or bladder habits.  He has no swelling, tenderness, numbness or tingling in his extremities at this time. His back pain is improved since having the kyphoplasty at T8.  He has maintained a good appetite and is staying well hydrated.   ECOG Performance Status: 1 - Symptomatic but completely ambulatory  Medications:  Allergies as of 11/28/2016   No Known Allergies     Medication List       Accurate as of 11/28/16 11:36 AM. Always use your most recent med list.          calcium carbonate 500 MG chewable tablet Commonly known as:  TUMS - dosed in mg elemental calcium Chew 1 tablet by mouth as needed for indigestion or heartburn.   carisoprodol 350 MG tablet Commonly known as:  SOMA Take 1 tablet (350 mg total) by mouth 4 (four) times daily as needed for muscle spasms.   cyclobenzaprine 10 MG tablet Commonly known as:  FLEXERIL Take 10 mg by mouth  as needed.   famciclovir 500 MG tablet Commonly known as:  FAMVIR Take 1 tablet (500 mg total) by mouth daily.   fentaNYL 50 MCG/HR Commonly known as:  DURAGESIC - dosed mcg/hr Place 1 patch (50 mcg total) onto the skin every other day.   HYDROcodone-acetaminophen 10-325 MG tablet Commonly known as:  NORCO Take 0.5-1 tablets by mouth every 8 (eight) hours as needed for moderate pain.   LORazepam 0.5 MG tablet Commonly known as:  ATIVAN Take 1 tablet (0.5 mg total) by mouth every 6 (six) hours as needed (Nausea or vomiting).   magnesium oxide 400 MG tablet Commonly known as:  MAG-OX Take 400 mg by mouth daily.   mometasone 0.1 % cream Commonly known as:  ELOCON Apply topically daily.   ondansetron 8 MG disintegrating tablet Commonly known as:  ZOFRAN-ODT Take 1 tablet (8 mg total) by mouth every 8 (eight) hours as needed.   Oxycodone HCl 10 MG Tabs Take 1 tablet (10 mg total) by mouth every 4 (four) hours as needed.   polyethylene glycol powder powder Commonly known as:  MIRALAX Take 17 g by mouth daily.   PROBIOTIC PO Take 1 capsule by mouth daily.   prochlorperazine 10 MG tablet Commonly known as:  COMPAZINE Take 1 tablet (10 mg total) by mouth every 6 (six) hours as needed for nausea or vomiting.   ranitidine  150 MG tablet Commonly known as:  ZANTAC Take 150 mg by mouth as needed for heartburn.   rivaroxaban 20 MG Tabs tablet Commonly known as:  XARELTO Take 1 tablet (20 mg total) by mouth daily with supper.   senna 8.6 MG Tabs tablet Commonly known as:  SENOKOT Take 2 tablets (17.2 mg total) by mouth daily.   sertraline 100 MG tablet Commonly known as:  ZOLOFT Take 100 mg by mouth at bedtime.   Vitamin D-3 5000 units Tabs Take 5,000 mg by mouth daily.   zolpidem 10 MG tablet Commonly known as:  AMBIEN Take 1 tablet (10 mg total) by mouth at bedtime as needed.       Allergies: No Known Allergies  Past Medical History, Surgical history, Social  history, and Family History were reviewed and updated.  Review of Systems: All other 10 point review of systems is negative.   Physical Exam:  vitals were not taken for this visit.  Wt Readings from Last 3 Encounters:  11/06/16 162 lb 6.4 oz (73.7 kg)  10/10/16 158 lb (71.7 kg)  09/19/16 158 lb (71.7 kg)    Ocular: Sclerae unicteric, pupils equal, round and reactive to light Ear-nose-throat: Oropharynx clear, dentition fair Lymphatic: No cervical, supraclavicular, axillary or inguinal adenopathy Lungs no rales or rhonchi, good excursion bilaterally Heart regular rate and rhythm, no murmur appreciated Abd soft, nontender, positive bowel sounds, no liver or spleen tip palpated on exam, no fluid wave MSK no focal spinal tenderness, no joint edema Neuro: non-focal, well-oriented, appropriate affect Breasts: Deferred   Lab Results  Component Value Date   WBC 3.9 (L) 11/06/2016   HGB 13.4 11/06/2016   HCT 40.0 11/06/2016   MCV 94 11/06/2016   PLT 132 (L) 11/06/2016   No results found for: FERRITIN, IRON, TIBC, UIBC, IRONPCTSAT Lab Results  Component Value Date   RBC 4.28 11/06/2016   Lab Results  Component Value Date   KAPLAMBRATIO 2.29 (H) 09/15/2016   Lab Results  Component Value Date   IGGSERUM 543 (L) 09/15/2016   IGMSERUM 21 09/15/2016   Lab Results  Component Value Date   MSPIKE 0.5 (H) 07/28/2016     Chemistry      Component Value Date/Time   NA 141 11/06/2016 0854   NA 140 10/03/2016 0845   K 4.0 11/06/2016 0854   K 4.3 10/03/2016 0845   CL 109 (H) 11/06/2016 0854   CO2 24 11/06/2016 0854   CO2 26 10/03/2016 0845   BUN 11 11/06/2016 0854   BUN 11.9 10/03/2016 0845   CREATININE 0.9 11/06/2016 0854   CREATININE 0.9 10/03/2016 0845      Component Value Date/Time   CALCIUM 9.4 11/06/2016 0854   CALCIUM 9.9 10/03/2016 0845   ALKPHOS 77 11/06/2016 0854   ALKPHOS 80 10/03/2016 0845   AST 34 11/06/2016 0854   AST 28 10/03/2016 0845   ALT 27 11/06/2016  0854   ALT 26 10/03/2016 0845   BILITOT 0.60 11/06/2016 0854   BILITOT 0.48 10/03/2016 0845      Impression and Plan: Jorge [name] is a very pleasant 61 yo yo caucasian male with IgG kappa myeloma and had an autologous stem cell transplant in February. So far he has done well. He is here today for an unscheduled visit. He found a tick on his left outer thigh yesterday. His wife tried to remove it but the head was still in the thigh. I removed this with tweezers and cleaned the area,  Redness at the site but no "bulls eye."  We will go ahead and treat him prophylactically for Lyme disease with Doxycycline 200 mg PO once today.  I will also route this note to Dr. Laverta Baltimore and Steffanie Dunn, RN in case they have anything else to add.  If he developed the bulls eye and/or becomes otherwise symptomatic we can certainly run further tests and extend the Doxycycline to BID for 14 days.  He will contact our office with any new questions or concerns. We can certainly see him sooner.   Eliezer Bottom, NP 5/18/201811:36 AM

## 2016-11-28 NOTE — Progress Notes (Unsigned)
E 

## 2016-12-04 ENCOUNTER — Ambulatory Visit (HOSPITAL_BASED_OUTPATIENT_CLINIC_OR_DEPARTMENT_OTHER): Payer: 59 | Admitting: Family

## 2016-12-04 ENCOUNTER — Other Ambulatory Visit (HOSPITAL_BASED_OUTPATIENT_CLINIC_OR_DEPARTMENT_OTHER): Payer: 59

## 2016-12-04 ENCOUNTER — Ambulatory Visit (HOSPITAL_BASED_OUTPATIENT_CLINIC_OR_DEPARTMENT_OTHER): Payer: 59

## 2016-12-04 VITALS — BP 101/60 | HR 88 | Temp 98.7°F | Resp 16 | Wt 161.0 lb

## 2016-12-04 DIAGNOSIS — C9000 Multiple myeloma not having achieved remission: Secondary | ICD-10-CM

## 2016-12-04 DIAGNOSIS — Z9484 Stem cells transplant status: Secondary | ICD-10-CM | POA: Diagnosis not present

## 2016-12-04 LAB — CBC WITH DIFFERENTIAL (CANCER CENTER ONLY)
BASO#: 0 10*3/uL (ref 0.0–0.2)
BASO%: 0.4 % (ref 0.0–2.0)
EOS ABS: 0.1 10*3/uL (ref 0.0–0.5)
EOS%: 1.8 % (ref 0.0–7.0)
HEMATOCRIT: 36.5 % — AB (ref 38.7–49.9)
HGB: 12.2 g/dL — ABNORMAL LOW (ref 13.0–17.1)
LYMPH#: 1.1 10*3/uL (ref 0.9–3.3)
LYMPH%: 20 % (ref 14.0–48.0)
MCH: 30.9 pg (ref 28.0–33.4)
MCHC: 33.4 g/dL (ref 32.0–35.9)
MCV: 92 fL (ref 82–98)
MONO#: 0.7 10*3/uL (ref 0.1–0.9)
MONO%: 13.1 % — ABNORMAL HIGH (ref 0.0–13.0)
NEUT#: 3.7 10*3/uL (ref 1.5–6.5)
NEUT%: 64.7 % (ref 40.0–80.0)
Platelets: 221 10*3/uL (ref 145–400)
RBC: 3.95 10*6/uL — ABNORMAL LOW (ref 4.20–5.70)
RDW: 13.5 % (ref 11.1–15.7)
WBC: 5.7 10*3/uL (ref 4.0–10.0)

## 2016-12-04 LAB — CMP (CANCER CENTER ONLY)
ALK PHOS: 75 U/L (ref 26–84)
ALT: 29 U/L (ref 10–47)
AST: 33 U/L (ref 11–38)
Albumin: 3.9 g/dL (ref 3.3–5.5)
BUN, Bld: 17 mg/dL (ref 7–22)
CO2: 26 meq/L (ref 18–33)
Calcium: 9 mg/dL (ref 8.0–10.3)
Chloride: 103 mEq/L (ref 98–108)
Creat: 0.9 mg/dl (ref 0.6–1.2)
GLUCOSE: 118 mg/dL (ref 73–118)
POTASSIUM: 3.7 meq/L (ref 3.3–4.7)
Sodium: 139 mEq/L (ref 128–145)
Total Bilirubin: 0.7 mg/dl (ref 0.20–1.60)
Total Protein: 6.5 g/dL (ref 6.4–8.1)

## 2016-12-04 LAB — LACTATE DEHYDROGENASE: LDH: 220 U/L (ref 125–245)

## 2016-12-04 MED ORDER — ZOLEDRONIC ACID 4 MG/100ML IV SOLN
4.0000 mg | Freq: Once | INTRAVENOUS | Status: AC
  Administered 2016-12-04: 4 mg via INTRAVENOUS
  Filled 2016-12-04: qty 100

## 2016-12-04 NOTE — Patient Instructions (Signed)

## 2016-12-04 NOTE — Progress Notes (Signed)
Hematology and Oncology Follow Up Visit  Jorge [name] [id number] 05-27-1956 61 y.o. 12/04/2016   Principle Diagnosis:  IgG Kappa myeloma - Hyperdiploid/+11 DVT of the LEFT and RIGHT leg  Current Therapy:   Autologous stem cell transplant at [facility] - infused on 08/22/2016 Zometa 4 mg IV monthly - started today 11/06/2016 Xarelto 20 mg PO daily   Interim History:  [name] is here today for follow-up. He is doing well and trying to stay busy walking for exercise.  He states that he spoke with his team at [facility] last week and they would like him to resume treatment with Revlimid.  In April, M-spike was 0.13 d/dL, IgG  543 mg/dL and Kappa light chain 0.87 mg/dL. Levels for today are pending.  He still has some back discomfort but this has improved some with the kyphoplasty.  No fever, chills, n/v, cough, rash, dizziness, SOB, chest pain, palpitations, abdominal pain or changes in bowel or bladder habits,  He is doing well and Xarelto and has had no episodes of bleeding, bruising or petechiae.  No swelling, tenderness, numbness or tingling in his extremities. His appetite comes and goes and is staying hydrated. His weight is stable.   ECOG Performance Status: 1 - Symptomatic but completely ambulatory  Medications:  Allergies as of 12/04/2016   No Known Allergies     Medication List       Accurate as of 12/04/16  1:46 PM. Always use your most recent med list.          calcium carbonate 500 MG chewable tablet Commonly known as:  TUMS - dosed in mg elemental calcium Chew 1 tablet by mouth as needed for indigestion or heartburn.   carisoprodol 350 MG tablet Commonly known as:  SOMA Take 1 tablet (350 mg total) by mouth 4 (four) times daily as needed for muscle spasms.   cyclobenzaprine 10 MG tablet Commonly known as:  FLEXERIL Take 10 mg by mouth as needed.   famciclovir 500 MG tablet Commonly known as:  FAMVIR Take 1 tablet (500 mg total) by mouth daily.   fentaNYL 50  MCG/HR Commonly known as:  DURAGESIC - dosed mcg/hr Place 1 patch (50 mcg total) onto the skin every other day.   HYDROcodone-acetaminophen 10-325 MG tablet Commonly known as:  NORCO Take 0.5-1 tablets by mouth every 8 (eight) hours as needed for moderate pain.   magnesium oxide 400 MG tablet Commonly known as:  MAG-OX Take 400 mg by mouth daily.   ondansetron 8 MG disintegrating tablet Commonly known as:  ZOFRAN-ODT Take 1 tablet (8 mg total) by mouth every 8 (eight) hours as needed.   Oxycodone HCl 10 MG Tabs Take 1 tablet (10 mg total) by mouth every 4 (four) hours as needed.   polyethylene glycol powder powder Commonly known as:  MIRALAX Take 17 g by mouth daily.   PROBIOTIC PO Take 1 capsule by mouth daily.   ranitidine 150 MG tablet Commonly known as:  ZANTAC Take 150 mg by mouth as needed for heartburn.   rivaroxaban 20 MG Tabs tablet Commonly known as:  XARELTO Take 1 tablet (20 mg total) by mouth daily with supper.   senna 8.6 MG Tabs tablet Commonly known as:  SENOKOT Take 2 tablets (17.2 mg total) by mouth daily.   sertraline 100 MG tablet Commonly known as:  ZOLOFT Take 100 mg by mouth at bedtime.   Vitamin D-3 5000 units Tabs Take 5,000 mg by mouth daily.   zolpidem 10 MG  tablet Commonly known as:  AMBIEN Take 1 tablet (10 mg total) by mouth at bedtime as needed.       Allergies: No Known Allergies  Past Medical History, Surgical history, Social history, and Family History were reviewed and updated.  Review of Systems: All other 10 point review of systems is negative.   Physical Exam:  weight is 161 lb (73 kg). His oral temperature is 98.7 F (37.1 C). His blood pressure is 101/60 and his pulse is 88. His respiration is 16 and oxygen saturation is 96%.   Wt Readings from Last 3 Encounters:  12/04/16 161 lb (73 kg)  11/28/16 163 lb (73.9 kg)  11/06/16 162 lb 6.4 oz (73.7 kg)    Ocular: Sclerae unicteric, pupils equal, round and reactive  to light Ear-nose-throat: Oropharynx clear, dentition fair Lymphatic: No cervical, supraclavicular or axillary adenopathy Lungs no rales or rhonchi, good excursion bilaterally Heart regular rate and rhythm, no murmur appreciated Abd soft, nontender, positive bowel sounds, no liver or spleen tip palpated on exam, no fluid wave MSK no focal spinal tenderness, no joint edema Neuro: non-focal, well-oriented, appropriate affect Breasts: Deferred   Lab Results  Component Value Date   WBC 5.7 12/04/2016   HGB 12.2 (L) 12/04/2016   HCT 36.5 (L) 12/04/2016   MCV 92 12/04/2016   PLT 221 12/04/2016   No results found for: FERRITIN, IRON, TIBC, UIBC, IRONPCTSAT Lab Results  Component Value Date   RBC 3.95 (L) 12/04/2016   Lab Results  Component Value Date   KAPLAMBRATIO 2.29 (H) 09/15/2016   Lab Results  Component Value Date   IGGSERUM 543 (L) 09/15/2016   IGMSERUM 21 09/15/2016   Lab Results  Component Value Date   MSPIKE 0.5 (H) 07/28/2016     Chemistry      Component Value Date/Time   NA 141 11/06/2016 0854   NA 140 10/03/2016 0845   K 4.0 11/06/2016 0854   K 4.3 10/03/2016 0845   CL 109 (H) 11/06/2016 0854   CO2 24 11/06/2016 0854   CO2 26 10/03/2016 0845   BUN 11 11/06/2016 0854   BUN 11.9 10/03/2016 0845   CREATININE 0.9 11/06/2016 0854   CREATININE 0.9 10/03/2016 0845      Component Value Date/Time   CALCIUM 9.4 11/06/2016 0854   CALCIUM 9.9 10/03/2016 0845   ALKPHOS 77 11/06/2016 0854   ALKPHOS 80 10/03/2016 0845   AST 34 11/06/2016 0854   AST 28 10/03/2016 0845   ALT 27 11/06/2016 0854   ALT 26 10/03/2016 0845   BILITOT 0.60 11/06/2016 0854   BILITOT 0.48 10/03/2016 0845      Impression and Plan: [name] is a very pleasant 61 yo caucasian male with IgG kappa myeloma and autologous stem cell transplant in February 2018. He is doing well and has had no new issues once we saw him last week. His tick bite healed without complications. He had his grass cut  and plans to spray it for bugs.  We will have him restart Revlimid 10 mg PO daily. Dr. Marin Olp will provide this prescription.  He received Zometa today as planned.  We will plan to see him back in 1 months for repeat lab work and follow-up. He follows up with [facility] again in 3 months.  Both he and his wife know to contact our office with any questions or concerns. We can certainly see him sooner if need be.   Eliezer Bottom, NP 5/24/20181:46 PM

## 2016-12-05 ENCOUNTER — Other Ambulatory Visit: Payer: 59

## 2016-12-05 ENCOUNTER — Ambulatory Visit: Payer: 59

## 2016-12-05 ENCOUNTER — Ambulatory Visit: Payer: 59 | Admitting: Family

## 2016-12-05 LAB — KAPPA/LAMBDA LIGHT CHAINS
IG KAPPA FREE LIGHT CHAIN: 8.8 mg/L (ref 3.3–19.4)
Ig Lambda Free Light Chain: 3.4 mg/L — ABNORMAL LOW (ref 5.7–26.3)
Kappa/Lambda FluidC Ratio: 2.59 — ABNORMAL HIGH (ref 0.26–1.65)

## 2016-12-05 LAB — BETA 2 MICROGLOBULIN, SERUM: BETA 2: 1.9 mg/L (ref 0.6–2.4)

## 2016-12-08 LAB — MULTIPLE MYELOMA PANEL, SERUM
ALBUMIN SERPL ELPH-MCNC: 4 g/dL (ref 2.9–4.4)
ALPHA 1: 0.3 g/dL (ref 0.0–0.4)
ALPHA2 GLOB SERPL ELPH-MCNC: 0.6 g/dL (ref 0.4–1.0)
Albumin/Glob SerPl: 1.7 (ref 0.7–1.7)
B-Globulin SerPl Elph-Mcnc: 0.9 g/dL (ref 0.7–1.3)
Gamma Glob SerPl Elph-Mcnc: 0.6 g/dL (ref 0.4–1.8)
Globulin, Total: 2.4 g/dL (ref 2.2–3.9)
IGG (IMMUNOGLOBIN G), SERUM: 569 mg/dL — AB (ref 700–1600)
IgA, Qn, Serum: 53 mg/dL — ABNORMAL LOW (ref 90–386)
IgM, Qn, Serum: 22 mg/dL (ref 20–172)
M PROTEIN SERPL ELPH-MCNC: 0.3 g/dL — AB
TOTAL PROTEIN: 6.4 g/dL (ref 6.0–8.5)

## 2016-12-09 ENCOUNTER — Other Ambulatory Visit: Payer: Self-pay | Admitting: *Deleted

## 2016-12-09 DIAGNOSIS — M545 Low back pain: Secondary | ICD-10-CM

## 2016-12-09 DIAGNOSIS — G8929 Other chronic pain: Secondary | ICD-10-CM

## 2016-12-09 DIAGNOSIS — D472 Monoclonal gammopathy: Secondary | ICD-10-CM

## 2016-12-09 DIAGNOSIS — C9000 Multiple myeloma not having achieved remission: Secondary | ICD-10-CM

## 2016-12-09 MED ORDER — FENTANYL 50 MCG/HR TD PT72: 50.0000 ug | MEDICATED_PATCH | TRANSDERMAL | 0 refills | Status: DC

## 2016-12-10 ENCOUNTER — Other Ambulatory Visit: Payer: Self-pay | Admitting: *Deleted

## 2016-12-10 ENCOUNTER — Other Ambulatory Visit: Payer: Self-pay | Admitting: Physician Assistant

## 2016-12-10 ENCOUNTER — Other Ambulatory Visit: Payer: Self-pay | Admitting: Hematology & Oncology

## 2016-12-10 DIAGNOSIS — G4701 Insomnia due to medical condition: Secondary | ICD-10-CM

## 2016-12-10 DIAGNOSIS — G8929 Other chronic pain: Principal | ICD-10-CM

## 2016-12-10 MED ORDER — LENALIDOMIDE 25 MG PO CAPS: 25.0000 mg | ORAL_CAPSULE | Freq: Every day | ORAL | 1 refills | Status: DC

## 2016-12-10 MED ORDER — LENALIDOMIDE 25 MG PO CAPS: ORAL_CAPSULE | ORAL | 1 refills | Status: DC

## 2016-12-11 ENCOUNTER — Other Ambulatory Visit: Payer: Self-pay | Admitting: *Deleted

## 2016-12-11 ENCOUNTER — Encounter: Payer: Self-pay | Admitting: Emergency Medicine

## 2016-12-11 DIAGNOSIS — G4701 Insomnia due to medical condition: Secondary | ICD-10-CM

## 2016-12-11 DIAGNOSIS — G8929 Other chronic pain: Principal | ICD-10-CM

## 2016-12-11 MED ORDER — ZOLPIDEM TARTRATE 10 MG PO TABS: 10.0000 mg | ORAL_TABLET | Freq: Every evening | ORAL | 3 refills | Status: DC | PRN

## 2016-12-11 NOTE — Telephone Encounter (Signed)
Last OV 04/09/16 Zolpidem last filled 04/09/16 #30 with 3  No CSC or UDS

## 2016-12-11 NOTE — Telephone Encounter (Signed)
My chart message sent advising patient needs to schedule an appointment for refill of Ambien

## 2016-12-12 ENCOUNTER — Encounter: Payer: Self-pay | Admitting: Family

## 2016-12-12 ENCOUNTER — Other Ambulatory Visit: Payer: 59

## 2016-12-12 ENCOUNTER — Encounter: Payer: Self-pay | Admitting: *Deleted

## 2016-12-16 ENCOUNTER — Emergency Department (HOSPITAL_COMMUNITY): Payer: 59

## 2016-12-16 ENCOUNTER — Encounter (HOSPITAL_BASED_OUTPATIENT_CLINIC_OR_DEPARTMENT_OTHER): Payer: Self-pay | Admitting: *Deleted

## 2016-12-16 ENCOUNTER — Telehealth: Payer: Self-pay | Admitting: *Deleted

## 2016-12-16 ENCOUNTER — Emergency Department (HOSPITAL_BASED_OUTPATIENT_CLINIC_OR_DEPARTMENT_OTHER)
Admission: EM | Admit: 2016-12-16 | Discharge: 2016-12-16 | Disposition: A | Discharge status: Home / Self Care | Payer: 59 | Attending: Emergency Medicine | Admitting: Emergency Medicine

## 2016-12-16 DIAGNOSIS — Z79899 Other long term (current) drug therapy: Secondary | ICD-10-CM | POA: Insufficient documentation

## 2016-12-16 DIAGNOSIS — S0001XA Abrasion of scalp, initial encounter: Secondary | ICD-10-CM

## 2016-12-16 DIAGNOSIS — Z87891 Personal history of nicotine dependence: Secondary | ICD-10-CM | POA: Diagnosis not present

## 2016-12-16 DIAGNOSIS — S0003XA Contusion of scalp, initial encounter: Secondary | ICD-10-CM

## 2016-12-16 DIAGNOSIS — Y929 Unspecified place or not applicable: Secondary | ICD-10-CM | POA: Insufficient documentation

## 2016-12-16 DIAGNOSIS — Y998 Other external cause status: Secondary | ICD-10-CM | POA: Insufficient documentation

## 2016-12-16 DIAGNOSIS — C9000 Multiple myeloma not having achieved remission: Secondary | ICD-10-CM | POA: Insufficient documentation

## 2016-12-16 DIAGNOSIS — S01312A Laceration without foreign body of left ear, initial encounter: Secondary | ICD-10-CM | POA: Insufficient documentation

## 2016-12-16 DIAGNOSIS — S0990XA Unspecified injury of head, initial encounter: Secondary | ICD-10-CM | POA: Diagnosis not present

## 2016-12-16 DIAGNOSIS — Z9481 Bone marrow transplant status: Secondary | ICD-10-CM | POA: Insufficient documentation

## 2016-12-16 DIAGNOSIS — Y9389 Activity, other specified: Secondary | ICD-10-CM | POA: Insufficient documentation

## 2016-12-16 DIAGNOSIS — Z800 Family history of malignant neoplasm of digestive organs: Secondary | ICD-10-CM | POA: Diagnosis not present

## 2016-12-16 DIAGNOSIS — Z7901 Long term (current) use of anticoagulants: Secondary | ICD-10-CM | POA: Diagnosis not present

## 2016-12-16 DIAGNOSIS — W06XXXA Fall from bed, initial encounter: Secondary | ICD-10-CM | POA: Diagnosis not present

## 2016-12-16 MED ORDER — LIDOCAINE HCL (PF) 1 % IJ SOLN
5.0000 mL | Freq: Once | INTRAMUSCULAR | Status: AC
  Administered 2016-12-16: 5 mL
  Filled 2016-12-16: qty 30

## 2016-12-16 MED ORDER — TETANUS-DIPHTH-ACELL PERTUSSIS 5-2.5-18.5 LF-MCG/0.5 IM SUSP: 0.5000 mL | Freq: Once | INTRAMUSCULAR | Status: DC

## 2016-12-16 NOTE — ED Provider Notes (Signed)
[facility] Provider Note: Jorge Spurling, MD, FACEP  CSN: [id number] MRN: [id number] ARRIVAL: 12/16/16 at [place]: [place]  Head Injury   HISTORY OF PRESENT ILLNESS  Jorge [name] is a 61 y.o. male with multiple myeloma on Xarelto. He is status post bone marrow transplant. He was sleeping in a bed just prior to arrival and fell out of the bed while sleeping. He struck the left side of his head on an object. He has a hematoma and abrasion to his left parietal region as well as a laceration of the superior aspect of the helix of the left ear. He has not been nauseated or vomiting. He denies neck pain or other injury. He has a normal mental status.   Past Medical History:  Diagnosis Date  . Anxiety   . Bone metastasis ([place])   . Chest cold 05/19/2016   productive cough  -- started on antibiotic  . Chronic back pain    due to bone mets from myeloma  . Cough   . Depression   . GERD (gastroesophageal reflux disease)   . Hiatal hernia   . History of chicken pox   . History of concussion    age 39 -- no residual  . History of DVT of lower extremity 03/21/2016  treated and completed w/ xarelto   per doppler left extensive occlusion common femoral, femoral, and popliteal veins and right partial occlusion common femoral and profunda femoral veins/  last doppler 06-04-2016 no evidence acute or chronic dvt noted either leg   . History of radiation therapy 06/09/16-06/23/16   lower thoracic spine 25 Gy in 10 fractions  . Mouth ulcers    secondary to radiation  . Multiple myeloma ([place]) dx 02/22/2016 via bone marrow bx---  oncologist-  [name]   IgG Kappa-- Hyperdiploid/ +11 w/ bone mets--  current treatment chemotherapy (started 08/ 2017)and pallitive radiation to back started 06-09-2016  . Renal calculus, right   . Wears contact lenses     Past Surgical History:  Procedure Laterality Date  . COLONOSCOPY  [id number]  . CYSTOSCOPY W/ URETERAL STENT  PLACEMENT Right 06/20/2016   Procedure: CYSTOSCOPY WITH STENT REPLACEMENT;  Surgeon: Kathie Rhodes, MD;  Location: [facility];  Service: Urology;  Laterality: Right;  . CYSTOSCOPY WITH RETROGRADE PYELOGRAM, URETEROSCOPY AND STENT PLACEMENT Right 05/30/2016   Procedure: CYSTOSCOPY WITH RETROGRADE PYELOGRAM, URETEROSCOPY AND STENT PLACEMENT,DILITATION URETERAL STRICTURE;  Surgeon: Kathie Rhodes, MD;  Location: [facility];  Service: Urology;  Laterality: Right;  . CYSTOSCOPY/RETROGRADE/URETEROSCOPY/STONE EXTRACTION WITH BASKET Right 06/20/2016   Procedure: CYSTOSCOPY/URETEROSCOPY/STONE EXTRACTION WITH BASKET;  Surgeon: Kathie Rhodes, MD;  Location[facility];  Service: Urology;  Laterality: Right;  . HOLMIUM LASER APPLICATION Right 70/0/1749   Procedure: HOLMIUM LASER APPLICATION;  Surgeon: Kathie Rhodes, MD;  Location: [facility];  Service: Urology;  Laterality: Right;  . IR GENERIC HISTORICAL  02/11/2016   IR RADIOLOGIST EVAL & MGMT 02/11/2016 [facility]  . IR GENERIC HISTORICAL  02/15/2016   IR BONE TUMOR(S)RF ABLATION 02/15/2016 Luanne Bras, MD [facility]  . IR GENERIC HISTORICAL  02/15/2016   IR BONE TUMOR(S)RF ABLATION 02/15/2016 Luanne Bras, MD [facility]  . IR GENERIC HISTORICAL  02/15/2016   IR BONE TUMOR(S)RF ABLATION 02/15/2016 Luanne Bras, MD [facility]  . IR GENERIC HISTORICAL  02/15/2016   IR KYPHO THORACIC WITH BONE BIOPSY 02/15/2016 Luanne Bras, MD [facility]  . IR GENERIC HISTORICAL  02/15/2016  IR KYPHO THORACIC WITH BONE BIOPSY 02/15/2016 Luanne Bras, MD [facility]  . IR GENERIC HISTORICAL  02/15/2016   IR VERTEBROPLASTY CERV/THOR BX INC UNI/BIL INC/INJECT/IMAGING 02/15/2016 Luanne Bras, MD [facility]  . IR GENERIC HISTORICAL  03/13/2016   IR KYPHO EA ADDL LEVEL THORACIC OR LUMBAR 03/13/2016 Luanne Bras, MD [facility]  . IR GENERIC HISTORICAL  03/13/2016   IR KYPHO EA ADDL LEVEL THORACIC OR LUMBAR  03/13/2016 Luanne Bras, MD [facility]  . IR GENERIC HISTORICAL  03/13/2016   IR BONE TUMOR(S)RF ABLATION 03/13/2016 Luanne Bras, MD [facility]  . IR GENERIC HISTORICAL  03/13/2016   IR KYPHO LUMBAR INC FX REDUCE BONE BX UNI/BIL CANNULATION INC/IMAGING 03/13/2016 Luanne Bras, MD [facility]  . IR GENERIC HISTORICAL  03/13/2016   IR BONE TUMOR(S)RF ABLATION 03/13/2016 Luanne Bras, MD [facility]  . IR GENERIC HISTORICAL  03/13/2016   IR BONE TUMOR(S)RF ABLATION 03/13/2016 Luanne Bras, MD [facility]  . IR GENERIC HISTORICAL  03/31/2016   IR RADIOLOGIST EVAL & MGMT 03/31/2016 [facility]  . LAPAROSCOPIC INGUINAL HERNIA REPAIR Bilateral 12-16-2013  dr gross  . RADIOLOGY WITH ANESTHESIA N/A 02/15/2016   Procedure: Spinal Ablation;  Surgeon: Luanne Bras, MD;  Location: [facility];  Service: Radiology;  Laterality: N/A;  . RADIOLOGY WITH ANESTHESIA N/A 03/13/2016   Procedure: LUMBER ABLATION;  Surgeon: Luanne Bras, MD;  Location: [facility];  Service: Radiology;  Laterality: N/A;  . ROTATOR CUFF REPAIR Right 2003  . TONSILLECTOMY  age 38  . WISDOM TOOTH EXTRACTION      Family History  Problem Relation Age of Onset  . Uterine cancer Mother   . Heart disease Father   . Hypertension Father   . Multiple sclerosis Sister   . Paranoid behavior Brother   . Drug abuse Brother   . Schizophrenia Brother   . Stroke Maternal Grandfather   . Cancer Maternal Aunt   . Leukemia Paternal Aunt   . Healthy Son        x1  . Healthy Daughter        x2  . Allergies Daughter        x1  . Diabetes Neg Hx   . Alzheimer's disease Neg Hx   . Parkinson's disease Neg Hx     Social History  Substance Use Topics  . Smoking status: Former Smoker    Packs/day: 1.00    Years: 7.00    Types: Cigarettes    Quit date: 11/02/1980  . Smokeless tobacco: Never Used  . Alcohol use Yes     Comment: occasional    Prior to Admission medications   Medication Sig Start Date End Date  Taking? Authorizing Provider  calcium carbonate (TUMS - DOSED IN MG ELEMENTAL CALCIUM) 500 MG chewable tablet Chew 1 tablet by mouth as needed for indigestion or heartburn.    [provider]  carisoprodol (SOMA) 350 MG tablet Take 1 tablet (350 mg total) by mouth 4 (four) times daily as needed for muscle spasms. 02/22/16   [name], MD  Cholecalciferol (VITAMIN D-3) 5000 units TABS Take 5,000 mg by mouth daily.    [provider]  cyclobenzaprine (FLEXERIL) 10 MG tablet Take 10 mg by mouth as needed. 09/01/16   [provider]  famciclovir (FAMVIR) 500 MG tablet Take 1 tablet (500 mg total) by mouth daily. 10/10/16   [name], MD  fentaNYL (DURAGESIC - DOSED MCG/HR) 50 MCG/HR Place 1 patch (50 mcg total) onto the  skin every other day. 12/09/16   [name], MD  HYDROcodone-acetaminophen (NORCO) 10-325 MG tablet Take 0.5-1 tablets by mouth every 8 (eight) hours as needed for moderate pain. 02/22/16   [name], MD  lenalidomide (REVLIMID) 25 MG capsule Take one capsule days 1-21. Repeat every 28 days. XNAT#[id number] 12/10/16   [name], MD  magnesium oxide (MAG-OX) 400 MG tablet Take 400 mg by mouth daily.    [provider]  ondansetron (ZOFRAN-ODT) 8 MG disintegrating tablet Take 1 tablet (8 mg total) by mouth every 8 (eight) hours as needed. 09/12/16   [name], MD  Oxycodone HCl 10 MG TABS Take 1 tablet (10 mg total) by mouth every 4 (four) hours as needed. 05/30/16   Kathie Rhodes, MD  polyethylene glycol powder (MIRALAX) powder Take 17 g by mouth daily. 03/26/16   [name], MD  Probiotic Product (PROBIOTIC PO) Take 1 capsule by mouth daily.    [provider]  ranitidine (ZANTAC) 150 MG tablet Take 150 mg by mouth as needed for heartburn.    [provider]  rivaroxaban (XARELTO) 20 MG TABS tablet Take 1 tablet (20 mg total) by mouth daily with supper. 07/18/16   [place], Holli Humbles, NP  senna  (SENOKOT) 8.6 MG TABS tablet Take 2 tablets (17.2 mg total) by mouth daily. 05/07/16   [place], Holli Humbles, NP  sertraline (ZOLOFT) 100 MG tablet Take 100 mg by mouth at bedtime.  01/02/16   [provider]  zolpidem (AMBIEN) 10 MG tablet Take 1 tablet (10 mg total) by mouth at bedtime as needed. 12/11/16   [name], MD    Allergies Patient has no known allergies.   REVIEW OF SYSTEMS  Negative except as noted here or in the History of Present Illness.   PHYSICAL EXAMINATION  Initial Vital Signs Blood pressure 120/83, pulse 68, temperature 98.1 F (36.7 C), temperature source Oral, resp. rate 18, height '5\' 7"'  (1.702 m), weight 73 kg (161 lb), SpO2 95 %.  Examination General: Well-developed, well-nourished male in no acute distress; appearance consistent with age of record HENT: normocephalic; no hemotympanum; left parietal scalp hematoma and abrasion; laceration to the superior aspect of helix of left ear:    Eyes: pupils equal, round and reactive to light; extraocular muscles intact Neck: supple; no C-spine tenderness Heart: regular rate and rhythm Lungs: clear to auscultation bilaterally Abdomen: soft; nondistended; nontender; bowel sounds present Extremities: No deformity; full range of motion; pulses normal Neurologic: Awake, alert and oriented; motor function intact in all extremities and symmetric; no facial droop Skin: Warm and dry Psychiatric: Normal mood and affect   RESULTS  Summary of this visit's results, reviewed by myself:   EKG Interpretation  Date/Time:    Ventricular Rate:    PR Interval:    QRS Duration:   QT Interval:    QTC Calculation:   R Axis:     Text Interpretation:        Laboratory Studies: No results found for this or any previous visit (from the past 24 hour(s)). Imaging Studies: No results found.  ED COURSE  Nursing notes and initial vitals signs, including pulse oximetry, reviewed.  Vitals:   12/16/16 0219  BP:  120/83  Pulse: 68  Resp: 18  Temp: 98.1 F (36.7 C)  TempSrc: Oral  SpO2: 95%  Weight: 73 kg (161 lb)  Height: '5\' 7"'  (1.702 m)   2:39 AM Will transfer by by mouth feed to  [facility] for emergent CT scan. Our CT scanner is out of order and I believe that urgency is in his best interest as he has a head injury in the context of anticoagulation. Discussed with Dr. Dina Rich at [facility].  PROCEDURES    ED DIAGNOSES     ICD-9-CM ICD-10-CM   1. Closed head injury, initial encounter 959.01 S09.90XA   2. Fall from bed, initial encounter [phone] W06.XXXA   3. Left parietal scalp hematoma, initial encounter 920 S00.03XA   4. Scalp abrasion, initial encounter 910.0 S00.01XA   5. Laceration of left ear, initial encounter 872.8 S01.312A        Angelmarie Ponzo, Jenny Reichmann, MD 12/16/16 [phone]

## 2016-12-16 NOTE — Discharge Instructions (Signed)
If you noticed increased headache, speech changes, vision changes, numbness, weakness, tingling, vomiting, or seizure call 911 and return to the ER for repeat head imaging.  The glue will fall off in about a week.  Keep the wound clean and dry for at least 24 hours.  Do not submerge the wound until completely healed.  Return for fever, redness, pain, or discharge from the wound.

## 2016-12-16 NOTE — ED Notes (Signed)
PT on blood thinner

## 2016-12-16 NOTE — ED Triage Notes (Addendum)
Pt rolled out of bed,  Denies loc, lac to left ear and hematoma and abrasion to left temporal area  No vomiting  Pt a&o  Pt is on blood thinner

## 2016-12-16 NOTE — ED Notes (Signed)
Bed: OE70 Expected date:  Expected time:  Means of arrival:  Comments: Transfer from [facility]

## 2016-12-16 NOTE — Telephone Encounter (Signed)
Patient fell out of bed in his sleep last pm and was seen in the ED. The physician in the ED suggested he receive a tetanus vaccine and the patient did not consent until he could check with [name].  Reviewed with [name]. He would like patient to ask Dr Laverta [place] of his transplant team to ensure that this vaccine is okay.  Patient is aware of instructions. He will contact Dr Laverta [place].

## 2016-12-16 NOTE — ED Provider Notes (Signed)
Patient seen originally at [facility] for fall out of bed. He states that he rolled out of bed. He struck his ear and head on the nightstand. He is anticoagulated, and CT was ordered, however CT scanner at [facility] is broken and patient was transferred to [facility] for [place] imaging. He denies any numbness, weakness, or tingling. Denies any vision changes or speech changes.  LACERATION REPAIR Performed by: Montine Circle Authorized by: Montine Circle Consent: Verbal consent obtained. Risks and benefits: risks, benefits and alternatives were discussed Consent given by: patient Patient identity confirmed: provided demographic data Prepped and Draped in normal sterile fashion Wound explored  Laceration Location: Left ear  Laceration Length: 1 cm  No Foreign Bodies seen or palpated  Anesthesia: none  Local anesthetic: none  Anesthetic total: none  Irrigation method: syringe Amount of cleaning: standard  Skin closure: dermabond  Number of sutures: dermabond  Technique: dermabond  Patient tolerance: Patient tolerated the procedure well with no immediate complications.   4:31 AM CT negative.  Patient is asymptomatic.  Stable for discharge.  Discussed risk of delayed bleeding.  Return precautions given.   Montine Circle, PA-C 12/16/16 1683    Merryl Hacker, MD 12/17/16 [phone]

## 2016-12-17 ENCOUNTER — Ambulatory Visit: Payer: 59

## 2016-12-18 ENCOUNTER — Telehealth: Payer: Self-pay | Admitting: Physician Assistant

## 2016-12-18 NOTE — Telephone Encounter (Signed)
Fine with me

## 2016-12-18 NOTE — Telephone Encounter (Signed)
Patient would like to transfer from [place] to [place] due to the location being to far.

## 2016-12-19 ENCOUNTER — Other Ambulatory Visit: Payer: 59

## 2016-12-19 DIAGNOSIS — M4850XD Collapsed vertebra, not elsewhere classified, site unspecified, subsequent encounter for fracture with routine healing: Secondary | ICD-10-CM | POA: Diagnosis not present

## 2016-12-24 NOTE — Telephone Encounter (Signed)
Ok to switch but please don't make switch until he actually comes in to see me first time.

## 2016-12-24 NOTE — Telephone Encounter (Signed)
Patient scheduled new patient appointment with Percell Miller for 01/07/17

## 2016-12-25 ENCOUNTER — Encounter: Payer: Self-pay | Admitting: Physical Therapy

## 2016-12-25 ENCOUNTER — Ambulatory Visit: Payer: 59 | Attending: Pain Medicine | Admitting: Physical Therapy

## 2016-12-25 DIAGNOSIS — M6281 Muscle weakness (generalized): Secondary | ICD-10-CM | POA: Insufficient documentation

## 2016-12-25 DIAGNOSIS — M546 Pain in thoracic spine: Secondary | ICD-10-CM | POA: Diagnosis not present

## 2016-12-25 DIAGNOSIS — R252 Cramp and spasm: Secondary | ICD-10-CM | POA: Insufficient documentation

## 2016-12-25 DIAGNOSIS — R293 Abnormal posture: Secondary | ICD-10-CM | POA: Insufficient documentation

## 2016-12-25 NOTE — Therapy (Signed)
[facility] [address]  [place], [place], [address] Phone: [phone]   Fax:  [phone]  Physical Therapy Evaluation  Patient Details  Name: Jorge [name] MRN: [id number] Date of Birth: 12/23/1955 Referring Provider: Durenda Age, MD  Encounter Date: 12/25/2016      PT End of Session - 12/25/16 1700    Visit Number 1   Number of Visits 16   Date for PT Re-Evaluation 02/20/17   Authorization Type [employer]   PT Start Time 1700   PT Stop Time 2542   PT Time Calculation (min) 52 min   Activity Tolerance Patient tolerated treatment well;No increased pain   Behavior During Therapy WFL for tasks assessed/performed      Past Medical History:  Diagnosis Date  . Anxiety   . Bone metastasis ([place])   . Chest cold 05/19/2016   productive cough  -- started on antibiotic  . Chronic back pain    due to bone mets from myeloma  . Cough   . Depression   . GERD (gastroesophageal reflux disease)   . Hiatal hernia   . History of chicken pox   . History of concussion    age 61 -- no residual  . History of DVT of lower extremity 03/21/2016  treated and completed w/ xarelto   per doppler left extensive occlusion common femoral, femoral, and popliteal veins and right partial occlusion common femoral and profunda femoral veins/  last doppler 06-04-2016 no evidence acute or chronic dvt noted either leg   . History of radiation therapy 06/09/16-06/23/16   lower thoracic spine 25 Gy in 10 fractions  . Mouth ulcers    secondary to radiation  . Multiple myeloma ([place]) dx 02/22/2016 via bone marrow bx---  oncologist-  [name]   IgG Kappa-- Hyperdiploid/ +11 w/ bone mets--  current treatment chemotherapy (started 08/ 2017)and pallitive radiation to back started 06-09-2016  . Renal calculus, right   . Wears contact lenses     Past Surgical History:  Procedure Laterality Date  . COLONOSCOPY  [id number]  . CYSTOSCOPY W/  URETERAL STENT PLACEMENT Right 06/20/2016   Procedure: CYSTOSCOPY WITH STENT REPLACEMENT;  Surgeon: Kathie Rhodes, MD;  Location: [facility];  Service: Urology;  Laterality: Right;  . CYSTOSCOPY WITH RETROGRADE PYELOGRAM, URETEROSCOPY AND STENT PLACEMENT Right 05/30/2016   Procedure: CYSTOSCOPY WITH RETROGRADE PYELOGRAM, URETEROSCOPY AND STENT PLACEMENT,DILITATION URETERAL STRICTURE;  Surgeon: Kathie Rhodes, MD;  Location: [facility];  Service: Urology;  Laterality: Right;  . CYSTOSCOPY/RETROGRADE/URETEROSCOPY/STONE EXTRACTION WITH BASKET Right 06/20/2016   Procedure: CYSTOSCOPY/URETEROSCOPY/STONE EXTRACTION WITH BASKET;  Surgeon: Kathie Rhodes, MD;  Location: [facility];  Service: Urology;  Laterality: Right;  . HOLMIUM LASER APPLICATION Right 70/12/2374   Procedure: HOLMIUM LASER APPLICATION;  Surgeon: Kathie Rhodes, MD;  Location: [facility];  Service: Urology;  Laterality: Right;  . IR GENERIC HISTORICAL  02/11/2016   IR RADIOLOGIST EVAL & MGMT 02/11/2016 [facility]  . IR GENERIC HISTORICAL  02/15/2016   IR BONE TUMOR(S)RF ABLATION 02/15/2016 Luanne Bras, MD [facility]  . IR GENERIC HISTORICAL  02/15/2016   IR BONE TUMOR(S)RF ABLATION 02/15/2016 Luanne Bras, MD [facility]  . IR GENERIC HISTORICAL  02/15/2016   IR BONE TUMOR(S)RF ABLATION 02/15/2016 Luanne Bras, MD [facility]  . IR GENERIC HISTORICAL  02/15/2016   IR KYPHO THORACIC WITH BONE BIOPSY 02/15/2016 Luanne Bras, MD [facility]  . IR GENERIC HISTORICAL  02/15/2016  IR KYPHO THORACIC WITH BONE BIOPSY 02/15/2016 Luanne Bras, MD [facility]  . IR GENERIC HISTORICAL  02/15/2016   IR VERTEBROPLASTY CERV/THOR BX INC UNI/BIL INC/INJECT/IMAGING 02/15/2016 Luanne Bras, MD [facility]  . IR GENERIC HISTORICAL  03/13/2016   IR KYPHO EA ADDL LEVEL THORACIC OR LUMBAR 03/13/2016 Luanne Bras, MD [facility]  . IR GENERIC HISTORICAL  03/13/2016   IR KYPHO EA ADDL LEVEL  THORACIC OR LUMBAR 03/13/2016 Luanne Bras, MD [facility]  . IR GENERIC HISTORICAL  03/13/2016   IR BONE TUMOR(S)RF ABLATION 03/13/2016 Luanne Bras, MD [facility]  . IR GENERIC HISTORICAL  03/13/2016   IR KYPHO LUMBAR INC FX REDUCE BONE BX UNI/BIL CANNULATION INC/IMAGING 03/13/2016 Luanne Bras, MD [facility]  . IR GENERIC HISTORICAL  03/13/2016   IR BONE TUMOR(S)RF ABLATION 03/13/2016 Luanne Bras, MD [facility]  . IR GENERIC HISTORICAL  03/13/2016   IR BONE TUMOR(S)RF ABLATION 03/13/2016 Luanne Bras, MD [facility]  . IR GENERIC HISTORICAL  03/31/2016   IR RADIOLOGIST EVAL & MGMT 03/31/2016 [facility]  . LAPAROSCOPIC INGUINAL HERNIA REPAIR Bilateral 12-16-2013  dr gross  . RADIOLOGY WITH ANESTHESIA N/A 02/15/2016   Procedure: Spinal Ablation;  Surgeon: Luanne Bras, MD;  Location: [facility];  Service: Radiology;  Laterality: N/A;  . RADIOLOGY WITH ANESTHESIA N/A 03/13/2016   Procedure: LUMBER ABLATION;  Surgeon: Luanne Bras, MD;  Location: [facility];  Service: Radiology;  Laterality: N/A;  . ROTATOR CUFF REPAIR Right 2003  . TONSILLECTOMY  age 61  . WISDOM TOOTH EXTRACTION      There were no vitals filed for this visit.       Subjective Assessment - 12/25/16 1706    Subjective Pt reporting feeling like he has recovered more quickly this time around after most recent kyphoplasties. Walking ~3 miles per day with his service dog.   Pertinent History T6 & T8 kypholoplasty on 11/21/16. Multiple myeloma with multiple vertebral compression fractures with kyphoplasties and vertbroloplasties. Stem cell tranplant on 08/22/16 at [facility]. He follows up regularly with Dr. Marin Olp. He is referred for OP PT for "eval & treat, therapeutic exercises: strengthing/conditioning, therapeutic activities: conditioning"   Limitations Sitting;Standing;Walking   How long can you sit comfortably? 1 hr   How long can you stand comfortably? 30 minutes   How long can you walk  comfortably? varies, most commonly able to walk 2 miles before pain interferes   Patient Stated Goals "gain more strength in legs and back"   Currently in Pain? Yes   Pain Score 2    Pain Location Back   Pain Orientation Mid;Medial;Right   Pain Descriptors / Indicators Aching   Pain Type Chronic pain   Pain Radiating Towards rarely wrapping around ribs to L side   Pain Onset More than a month ago   Pain Frequency Intermittent   Aggravating Factors  prolonged standing, work around the house, walking uphill   Pain Relieving Factors rest   Effect of Pain on Daily Activities limited ability to pick up things, esp from floor            [facility] PT Assessment - 12/25/16 1700      Assessment   Medical Diagnosis Vertebral compression fractures T6 &T8 s/p kyphoplasties   Referring Provider Durenda Age, MD   Onset Date/Surgical Date 11/21/16   Hand Dominance Right   Next MD Visit Sept 2018   Prior Therapy PT 09/16/16 -11/18/16     Precautions   Precautions Back   Precaution  Comments Due to extensive vertebral involvement and compression fractures, will avoid all flexion based activity and lifting >10 pounds.   Required Braces or Orthoses Spinal Brace   Spinal Brace Thoracolumbosacral orthotic  worn when going shopping or doing increased activity     Balance Screen   Has the patient fallen in the past 6 months Yes   How many times? 1   Has the patient had a decrease in activity level because of a fear of falling?  No   Is the patient reluctant to leave their home because of a fear of falling?  No     Home Environment   Living Environment Private residence   Living Arrangements Spouse/significant other   Type of [place] Access Level entry   [place] Two level;Able to live on main level with bedroom/bathroom     Prior Function   Level of Independence Independent   Vocation On disability   Leisure walking daily     Observation/Other Assessments   Focus  on Therapeutic Outcomes (FOTO)  42% (59% limitation); predicted 54% (47% llimitation)     Posture/Postural Control   Posture/Postural Control Postural limitations   Postural Limitations Forward head;Rounded Shoulders;Increased thoracic kyphosis;Flexed trunk     ROM / Strength   AROM / PROM / Strength Strength     Strength   Right Hip Flexion 4+/5   Right Hip Extension 3-/5   Right Hip External Rotation  4-/5   Right Hip Internal Rotation 4/5   Right Hip ABduction 4/5   Right Hip ADduction 4-/5   Left Hip Flexion 4+/5   Left Hip Extension 3-/5   Left Hip External Rotation 4-/5   Left Hip Internal Rotation 4/5   Left Hip ABduction 4/5   Left Hip ADduction 4-/5   Right Knee Flexion 4/5   Right Knee Extension 4/5   Left Knee Flexion 4/5   Left Knee Extension 4/5     Flexibility   Soft Tissue Assessment /Muscle Length yes   Hamstrings severely tight B   Quadriceps severely tight in quad (esp RF) & hip flexors B   ITB moderately tight B   Piriformis moderately tight B            Objective measurements completed on examination: See above findings.          [facility] Adult PT Treatment/Exercise - 12/25/16 1700      Exercises   Exercises Lumbar     Lumbar Exercises: Stretches   Passive Hamstring Stretch 30 seconds;2 reps   Passive Hamstring Stretch Limitations supine with strap   Single Knee to Chest Stretch 30 seconds;2 reps   Single Knee to Chest Stretch Limitations opp LE straight   ITB Stretch 30 seconds;2 reps   ITB Stretch Limitations supine with strap - close monitoring to avoid trunk rotation   Piriformis Stretch 30 seconds;2 reps   Piriformis Stretch Limitations supine figure 4 with strap and overpressure at knee, KTOS                PT Education - 12/25/16 1750    Education provided Yes   Education Details PT eval findings, POC & initial HEP - [place] extension exercises (pt still has handout from prior PT episode) & LE flexibility stretches    Person(s) Educated Patient   Methods Explanation;Demonstration;Handout   Comprehension Verbalized understanding;Returned demonstration;Need further instruction          PT Short Term Goals - 12/25/16 1814  PT SHORT TERM GOAL #1   Title independent with initial HEP by 01/09/17   Status New           PT Long Term Goals - 12/25/16 1817      PT LONG TERM GOAL #1   Title Independent with ongoing HEP +/- gym program to increase back extension & LE strength and decrease pain by 02/20/17   Status New     PT LONG TERM GOAL #2   Title B LE strength >/= 4+/5 for improved ease of mobility by 02/20/17   Status New     PT LONG TERM GOAL #3   Title Pt will tolerate standing in the kitchen for >/= 45 minutes w/o limitation due to back pain to allow for meal prep by 02/20/17   Status New     PT LONG TERM GOAL #4   Title Pt will be able to perform a deep squat with good body mechanics to allow safe retrieval of objects from floor by 02/20/17   Status New                Plan - 12/25/16 1810    Clinical Impression Statement Reiner is a 61 y/o male who returns to OP PT ~1 month s/p T6 & T8 kyphoplasties related to extensive spinal compression fractures resulting from multiple myeloma. Pt has h/o T5-T12 compression fractures and has had multiple thoracic level kyphoplasties as well as a stem cell transplant in February of this year.  Assessment reveals forward flexed posture with mild/mod kyphosis and significantly limited proximal LE flexibility to to moderate to severe muscle tightness. Proximal LE strength mild to moderately weak, esp in hip extension. He wants to gain more strength in his legs and back and reduce his back pain.  Pt demonstrates good potential to benefit from skilled PT for postural training with emphasis on neutral spine alignment, increasing LE flexibility, core/lumbar stabilization and LE strengthening. May consider dry needling for pain/increased muscle tension as  indicated.  Due to extensive vertebral involvement and compression fractures, will avoid all flexion based activity and lifting >10 pounds.   History and Personal Factors relevant to plan of care: T6 & T8 kypholoplasty on 11/21/16. Multiple myeloma with multiple vertebral compression fractures with kyphoplasties and vertbroloplasties. Stem cell transplant on 08/22/16 at [facility]. Regular ongoing f/u with oncologist, Dr. Marin Olp.     Clinical Presentation Evolving   Clinical Presentation due to: multiple cormorbities and evolving nature of his pain   Clinical Decision Making Moderate   Rehab Potential Good   Clinical Impairments Affecting Rehab Potential T6 & T8 kypholoplasty on 11/21/16. Multiple myeloma with multiple vertebral compression fractures with kyphoplasties and vertbroloplasties. Stem cell transplant on 08/22/16 at [facility]. Regular ongoing f/u with oncologist, Dr. Marin Olp. Due to extensive vertebral involvement and compression fractures, will avoid all flexion based activity and lifting >10 pounds.    PT Frequency 2x / week   PT Duration 8 weeks   PT Treatment/Interventions Patient/family education;ADLs/Self Care Home Management;Energy conservation;Neuromuscular re-education;Therapeutic exercise;Therapeutic activities;Functional mobility training;Gait training;Balance training;Moist Heat;Cryotherapy;Manual techniques;Taping;Dry needling   PT Next Visit Plan Review initial HEP ([place] extension exercises & LE stretches); LE flexibility including manual therapy and possible dry needling to address muscular tightness; core/lumbar stabilization exercises; postural training   Consulted and Agree with Plan of Care Patient      Patient will benefit from skilled therapeutic intervention in order to improve the following deficits and impairments:  Pain, Impaired flexibility, Increased muscle spasms, Decreased range of  motion, Decreased strength, Decreased endurance, Decreased activity tolerance, Decreased  mobility, Postural dysfunction, Improper body mechanics, Impaired perceived functional ability, Decreased knowledge of precautions  Visit Diagnosis: Abnormal posture  Muscle weakness (generalized)  Pain in thoracic spine  Cramp and spasm     Problem List Patient Active Problem List   Diagnosis Date Noted  . Phlebitis or thrombophlebitis of lower extremity 07/21/2016  . Visit for preventive health examination 05/16/2016  . Aphthous ulcer 05/16/2016  . Ureterolithiasis 05/16/2016  . Acute deep vein thrombosis (DVT) of femoral vein of left lower extremity ([place]) 03/21/2016  . Multiple myeloma ([place]) 02/22/2016  . Multiple myeloma not having achieved remission ([place]) 02/22/2016  . LBP (low back pain) 11/02/2013  . Bilateral inguinal hernia (BIH), s/p lap repair 12/16/2013 11/02/2013    Percival Spanish, PT, MPT 12/25/2016, 7:35 PM  [facility] [address]  [place], [place], [address] Phone: [phone]   Fax:  [phone]  Name: Jermond Burkemper MRN: [id number] Date of Birth: 1955-08-04

## 2016-12-26 ENCOUNTER — Other Ambulatory Visit: Payer: 59

## 2016-12-29 ENCOUNTER — Ambulatory Visit: Payer: 59 | Admitting: Physical Therapy

## 2016-12-29 DIAGNOSIS — M6281 Muscle weakness (generalized): Secondary | ICD-10-CM

## 2016-12-29 DIAGNOSIS — R293 Abnormal posture: Secondary | ICD-10-CM

## 2016-12-29 DIAGNOSIS — R252 Cramp and spasm: Secondary | ICD-10-CM

## 2016-12-29 DIAGNOSIS — M546 Pain in thoracic spine: Secondary | ICD-10-CM

## 2016-12-29 NOTE — Therapy (Signed)
[facility] [address]  [place], [place], [address] Phone: [phone]   Fax:  [phone]  Physical Therapy Treatment  Patient Details  Name: Jorge [name] MRN: [id number] Date of Birth: August 23, 1955 Referring Provider: Durenda Age, MD  Encounter Date: 12/29/2016      PT End of Session - 12/29/16 1705    Visit Number 2   Number of Visits 16   Date for PT Re-Evaluation 02/20/17   Authorization Type UHC   PT Start Time 0093   PT Stop Time 1745   PT Time Calculation (min) 40 min   Activity Tolerance Patient tolerated treatment well   Behavior During Therapy [facility] for tasks assessed/performed      Past Medical History:  Diagnosis Date  . Anxiety   . Bone metastasis ([place])   . Chest cold 05/19/2016   productive cough  -- started on antibiotic  . Chronic back pain    due to bone mets from myeloma  . Cough   . Depression   . GERD (gastroesophageal reflux disease)   . Hiatal hernia   . History of chicken pox   . History of concussion    age 21 -- no residual  . History of DVT of lower extremity 03/21/2016  treated and completed w/ xarelto   per doppler left extensive occlusion common femoral, femoral, and popliteal veins and right partial occlusion common femoral and profunda femoral veins/  last doppler 06-04-2016 no evidence acute or chronic dvt noted either leg   . History of radiation therapy 06/09/16-06/23/16   lower thoracic spine 25 Gy in 10 fractions  . Mouth ulcers    secondary to radiation  . Multiple myeloma ([place]) dx 02/22/2016 via bone marrow bx---  oncologist-  [name]   IgG Kappa-- Hyperdiploid/ +11 w/ bone mets--  current treatment chemotherapy (started 08/ 2017)and pallitive radiation to back started 06-09-2016  . Renal calculus, right   . Wears contact lenses     Past Surgical History:  Procedure Laterality Date  . COLONOSCOPY  [id number]  . CYSTOSCOPY W/ URETERAL STENT  PLACEMENT Right 06/20/2016   Procedure: CYSTOSCOPY WITH STENT REPLACEMENT;  Surgeon: Kathie Rhodes, MD;  Location: [facility];  Service: Urology;  Laterality: Right;  . CYSTOSCOPY WITH RETROGRADE PYELOGRAM, URETEROSCOPY AND STENT PLACEMENT Right 05/30/2016   Procedure: CYSTOSCOPY WITH RETROGRADE PYELOGRAM, URETEROSCOPY AND STENT PLACEMENT,DILITATION URETERAL STRICTURE;  Surgeon: Kathie Rhodes, MD;  Location: [facility];  Service: Urology;  Laterality: Right;  . CYSTOSCOPY/RETROGRADE/URETEROSCOPY/STONE EXTRACTION WITH BASKET Right 06/20/2016   Procedure: CYSTOSCOPY/URETEROSCOPY/STONE EXTRACTION WITH BASKET;  Surgeon: Kathie Rhodes, MD;  Location: [facility];  Service: Urology;  Laterality: Right;  . HOLMIUM LASER APPLICATION Right 81/02/2992   Procedure: HOLMIUM LASER APPLICATION;  Surgeon: Kathie Rhodes, MD;  Location: [facility];  Service: Urology;  Laterality: Right;  . IR GENERIC HISTORICAL  02/11/2016   IR RADIOLOGIST EVAL & MGMT 02/11/2016 [facility]  . IR GENERIC HISTORICAL  02/15/2016   IR BONE TUMOR(S)RF ABLATION 02/15/2016 Luanne Bras, MD [facility]  . IR GENERIC HISTORICAL  02/15/2016   IR BONE TUMOR(S)RF ABLATION 02/15/2016 Luanne Bras, MD [facility]  . IR GENERIC HISTORICAL  02/15/2016   IR BONE TUMOR(S)RF ABLATION 02/15/2016 Luanne Bras, MD [facility]  . IR GENERIC HISTORICAL  02/15/2016   IR KYPHO THORACIC WITH BONE BIOPSY 02/15/2016 Luanne Bras, MD [facility]  . IR GENERIC HISTORICAL  02/15/2016   IR KYPHO  THORACIC WITH BONE BIOPSY 02/15/2016 Luanne Bras, MD [facility]  . IR GENERIC HISTORICAL  02/15/2016   IR VERTEBROPLASTY CERV/THOR BX INC UNI/BIL INC/INJECT/IMAGING 02/15/2016 Luanne Bras, MD [facility]  . IR GENERIC HISTORICAL  03/13/2016   IR KYPHO EA ADDL LEVEL THORACIC OR LUMBAR 03/13/2016 Luanne Bras, MD [facility]  . IR GENERIC HISTORICAL  03/13/2016   IR KYPHO EA ADDL LEVEL THORACIC OR LUMBAR  03/13/2016 Luanne Bras, MD [facility]  . IR GENERIC HISTORICAL  03/13/2016   IR BONE TUMOR(S)RF ABLATION 03/13/2016 Luanne Bras, MD [facility]  . IR GENERIC HISTORICAL  03/13/2016   IR KYPHO LUMBAR INC FX REDUCE BONE BX UNI/BIL CANNULATION INC/IMAGING 03/13/2016 Luanne Bras, MD [facility]  . IR GENERIC HISTORICAL  03/13/2016   IR BONE TUMOR(S)RF ABLATION 03/13/2016 Luanne Bras, MD [facility]  . IR GENERIC HISTORICAL  03/13/2016   IR BONE TUMOR(S)RF ABLATION 03/13/2016 Luanne Bras, MD [facility]  . IR GENERIC HISTORICAL  03/31/2016   IR RADIOLOGIST EVAL & MGMT 03/31/2016 [facility]  . LAPAROSCOPIC INGUINAL HERNIA REPAIR Bilateral 12-16-2013  dr gross  . RADIOLOGY WITH ANESTHESIA N/A 02/15/2016   Procedure: Spinal Ablation;  Surgeon: Luanne Bras, MD;  Location: [facility];  Service: Radiology;  Laterality: N/A;  . RADIOLOGY WITH ANESTHESIA N/A 03/13/2016   Procedure: LUMBER ABLATION;  Surgeon: Luanne Bras, MD;  Location: [facility];  Service: Radiology;  Laterality: N/A;  . ROTATOR CUFF REPAIR Right 2003  . TONSILLECTOMY  age 45  . WISDOM TOOTH EXTRACTION      There were no vitals filed for this visit.      Subjective Assessment - 12/29/16 1705    Subjective Pt reporting no issues or concerns with initial HEP.   Pertinent History T6 & T8 kypholoplasty on 11/21/16. Multiple myeloma with multiple vertebral compression fractures with kyphoplasties and vertbroloplasties. Stem cell tranplant on 08/22/16 at [facility]. He follows up regularly with Dr. Marin Olp. He is referred for OP PT for "eval & treat, therapeutic exercises: strengthing/conditioning, therapeutic activities: conditioning"   Limitations Sitting;Standing;Walking   How long can you sit comfortably? 1 hr   How long can you stand comfortably? 30 minutes   How long can you walk comfortably? varies, most commonly able to walk 2 miles before pain interferes   Patient Stated Goals "gain more strength in legs  and back"   Currently in Pain? Yes   Pain Score 2    Pain Location Back   Pain Orientation Mid;Medial;Right   Pain Descriptors / Indicators Aching   Pain Type Chronic pain                         [facility] Adult PT Treatment/Exercise - 12/29/16 1705      Lumbar Exercises: Stretches   Passive Hamstring Stretch 30 seconds;2 reps   Passive Hamstring Stretch Limitations supine with strap   Single Knee to Chest Stretch 30 seconds;2 reps   Single Knee to Chest Stretch Limitations opp LE straight   ITB Stretch 30 seconds;2 reps   ITB Stretch Limitations supine with strap - close monitoring to avoid trunk rotation   Piriformis Stretch 30 seconds;2 reps   Piriformis Stretch Limitations supine figure 4 with lifting at ankle & overpressure at knee, KTOS     Lumbar Exercises: Aerobic   Stationary Bike lvl 2 x 6'     Lumbar Exercises: Standing   Row Both;15 reps;Theraband;Strengthening   Theraband Level (Row) Level 3 (Green)  Shoulder Extension Both;15 reps;Theraband;Strengthening   Theraband Level (Shoulder Extension) Level 3 (Green)   Shoulder Extension Limitations to neutral + scap retraction, 3" hold     Lumbar Exercises: Supine   Ab Set 10 reps;5 seconds   Glut Set 10 reps;5 seconds   Bent Knee Raise 15 reps;3 seconds   Bent Knee Raise Limitations brace marching   Other Supine Lumbar Exercises alt bent knee fall-out 15x3"   Other Supine Lumbar Exercises Meeks decompression excercise x 5', B shoudler press 20x3", Head press 10x3", Unilateral leg press 10x3" each     Shoulder Exercises: Supine   Horizontal ABduction Both;15 reps;Theraband;Strengthening   Theraband Level (Shoulder Horizontal ABduction) Level 3 (Green)   Horizontal ABduction Limitations hooklying + TrA   External Rotation Both;15 reps;Theraband   Theraband Level (Shoulder External Rotation) Level 3 (Green)   External Rotation Limitations hooklying + TrA   Other Supine Exercises TrA + hooklying alt  shoulder flexion + opp shoulder extension with green TB x15                  PT Short Term Goals - 12/29/16 1711      PT SHORT TERM GOAL #1   Title independent with initial HEP by 01/09/17   Status On-going           PT Long Term Goals - 12/29/16 1711      PT LONG TERM GOAL #1   Title Independent with ongoing HEP +/- gym program to increase back extension & LE strength and decrease pain by 02/20/17   Status On-going     PT LONG TERM GOAL #2   Title B LE strength >/= 4+/5 for improved ease of mobility by 02/20/17   Status On-going     PT LONG TERM GOAL #3   Title Pt will tolerate standing in the kitchen for >/= 45 minutes w/o limitation due to back pain to allow for meal prep by 02/20/17   Status On-going     PT LONG TERM GOAL #4   Title Pt will be able to perform a deep squat with good body mechanics to allow safe retrieval of objects from floor by 02/20/17   Status On-going               Plan - 12/29/16 1714    Clinical Impression Statement Pt reports working on Meeks decompression exercises and stretches without issue at home, and also admitting to resuming some of the resisted exercises with green TB while reporting no ill-effects. Initiated supine abdominal bracing activities and muscle setting exercises with good pt tolerance and will plan to gradually add resistance as tolerated.   Rehab Potential Good   Clinical Impairments Affecting Rehab Potential T6 & T8 kypholoplasty on 11/21/16. Multiple myeloma with multiple vertebral compression fractures with kyphoplasties and vertbroloplasties. Stem cell transplant on 08/22/16 at [facility]. Regular ongoing f/u with oncologist, Dr. Marin Olp. Due to extensive vertebral involvement and compression fractures, will avoid all flexion based activity and lifting >10 pounds.    PT Treatment/Interventions Patient/family education;ADLs/Self Care Home Management;Energy conservation;Neuromuscular re-education;Therapeutic  exercise;Therapeutic activities;Functional mobility training;Gait training;Balance training;Moist Heat;Cryotherapy;Manual techniques;Taping;Dry needling   PT Next Visit Plan LE flexibility including manual therapy and possible dry needling to address muscular tightness; core/lumbar stabilization exercises; postural training   Consulted and Agree with Plan of Care Patient      Patient will benefit from skilled therapeutic intervention in order to improve the following deficits and impairments:  Pain, Impaired flexibility, Increased muscle spasms, Decreased  range of motion, Decreased strength, Decreased endurance, Decreased activity tolerance, Decreased mobility, Postural dysfunction, Improper body mechanics, Impaired perceived functional ability, Decreased knowledge of precautions  Visit Diagnosis: Abnormal posture  Muscle weakness (generalized)  Pain in thoracic spine  Cramp and spasm     Problem List Patient Active Problem List   Diagnosis Date Noted  . Phlebitis or thrombophlebitis of lower extremity 07/21/2016  . Visit for preventive health examination 05/16/2016  . Aphthous ulcer 05/16/2016  . Ureterolithiasis 05/16/2016  . Acute deep vein thrombosis (DVT) of femoral vein of left lower extremity ([place]) 03/21/2016  . Multiple myeloma ([place]) 02/22/2016  . Multiple myeloma not having achieved remission ([place]) 02/22/2016  . LBP (low back pain) 11/02/2013  . Bilateral inguinal hernia (BIH), s/p lap repair 12/16/2013 11/02/2013    Percival Spanish, PT, MPT 12/29/2016, 5:57 PM  [facility] [address]  [place], [place], [address] Phone: [phone]   Fax:  [phone]  Name: Hulon Ferron MRN: [id number] Date of Birth: [birth year]

## 2016-12-30 ENCOUNTER — Other Ambulatory Visit: Payer: Self-pay | Admitting: Hematology & Oncology

## 2016-12-30 ENCOUNTER — Telehealth: Payer: Self-pay | Admitting: Pharmacist

## 2016-12-30 NOTE — Telephone Encounter (Signed)
I contacted pt by phone to discuss with him re: Tetanus vaccination. He stated he talked to Dr. Laverta Baltimore and transplant team and was advised to WAIT on getting the Tetanus vaccine; it is not necessary at this time.  We have ordered a dose of Shingrix and pt is aware we are waiting for it to be delivered from [facility] in order for administration, hopefully on 6/25 when he is here for Zometa infusion.  Kennith Center, Pharm.D., CPP 12/30/2016@1 :56 PM

## 2016-12-31 ENCOUNTER — Other Ambulatory Visit: Payer: Self-pay | Admitting: *Deleted

## 2016-12-31 MED ORDER — LENALIDOMIDE 25 MG PO CAPS: ORAL_CAPSULE | ORAL | 0 refills | Status: DC

## 2017-01-01 ENCOUNTER — Ambulatory Visit: Payer: 59 | Admitting: Physical Therapy

## 2017-01-01 DIAGNOSIS — M546 Pain in thoracic spine: Secondary | ICD-10-CM

## 2017-01-01 DIAGNOSIS — M6281 Muscle weakness (generalized): Secondary | ICD-10-CM

## 2017-01-01 DIAGNOSIS — R252 Cramp and spasm: Secondary | ICD-10-CM

## 2017-01-01 DIAGNOSIS — R293 Abnormal posture: Secondary | ICD-10-CM | POA: Diagnosis not present

## 2017-01-01 NOTE — Therapy (Signed)
[facility] [address]  [place], [place], [address] Phone: [phone]   Fax:  [phone]  Physical Therapy Treatment  Patient Details  Name: Jorge [name] MRN: [id number] Date of Birth: 11-02-1955 Referring Provider: Durenda Age, MD  Encounter Date: 01/01/2017      PT End of Session - 01/01/17 1618    Visit Number 3   Number of Visits 16   Date for PT Re-Evaluation 02/20/17   Authorization Type UHC   PT Start Time 1618   PT Stop Time 1657   PT Time Calculation (min) 39 min   Activity Tolerance Patient tolerated treatment well   Behavior During Therapy [facility] for tasks assessed/performed      Past Medical History:  Diagnosis Date  . Anxiety   . Bone metastasis ([place])   . Chest cold 05/19/2016   productive cough  -- started on antibiotic  . Chronic back pain    due to bone mets from myeloma  . Cough   . Depression   . GERD (gastroesophageal reflux disease)   . Hiatal hernia   . History of chicken pox   . History of concussion    age 62 -- no residual  . History of DVT of lower extremity 03/21/2016  treated and completed w/ xarelto   per doppler left extensive occlusion common femoral, femoral, and popliteal veins and right partial occlusion common femoral and profunda femoral veins/  last doppler 06-04-2016 no evidence acute or chronic dvt noted either leg   . History of radiation therapy 06/09/16-06/23/16   lower thoracic spine 25 Gy in 10 fractions  . Mouth ulcers    secondary to radiation  . Multiple myeloma ([place]) dx 02/22/2016 via bone marrow bx---  oncologist-  [name]   IgG Kappa-- Hyperdiploid/ +11 w/ bone mets--  current treatment chemotherapy (started 08/ 2017)and pallitive radiation to back started 06-09-2016  . Renal calculus, right   . Wears contact lenses     Past Surgical History:  Procedure Laterality Date  . COLONOSCOPY  [id number]  . CYSTOSCOPY W/ URETERAL STENT  PLACEMENT Right 06/20/2016   Procedure: CYSTOSCOPY WITH STENT REPLACEMENT;  Surgeon: Kathie Rhodes, MD;  Location: [facility];  Service: Urology;  Laterality: Right;  . CYSTOSCOPY WITH RETROGRADE PYELOGRAM, URETEROSCOPY AND STENT PLACEMENT Right 05/30/2016   Procedure: CYSTOSCOPY WITH RETROGRADE PYELOGRAM, URETEROSCOPY AND STENT PLACEMENT,DILITATION URETERAL STRICTURE;  Surgeon: Kathie Rhodes, MD;  Location[facility];  Service: Urology;  Laterality: Right;  . CYSTOSCOPY/RETROGRADE/URETEROSCOPY/STONE EXTRACTION WITH BASKET Right 06/20/2016   Procedure: CYSTOSCOPY/URETEROSCOPY/STONE EXTRACTION WITH BASKET;  Surgeon: Kathie Rhodes, MD;  Location: [facility];  Service: Urology;  Laterality: Right;  . HOLMIUM LASER APPLICATION Right 51/0/2585   Procedure: HOLMIUM LASER APPLICATION;  Surgeon: Kathie Rhodes, MD;  Location: [facility];  Service: Urology;  Laterality: Right;  . IR GENERIC HISTORICAL  02/11/2016   IR RADIOLOGIST EVAL & MGMT 02/11/2016 [facility]  . IR GENERIC HISTORICAL  02/15/2016   IR BONE TUMOR(S)RF ABLATION 02/15/2016 Luanne Bras, MD [facility]  . IR GENERIC HISTORICAL  02/15/2016   IR BONE TUMOR(S)RF ABLATION 02/15/2016 Luanne Bras, MD [facility]  . IR GENERIC HISTORICAL  02/15/2016   IR BONE TUMOR(S)RF ABLATION 02/15/2016 Luanne Bras, MD [facility]  . IR GENERIC HISTORICAL  02/15/2016   IR KYPHO THORACIC WITH BONE BIOPSY 02/15/2016 Luanne Bras, MD [facility]  . IR GENERIC HISTORICAL  02/15/2016   IR KYPHO  THORACIC WITH BONE BIOPSY 02/15/2016 Luanne Bras, MD [facility]  . IR GENERIC HISTORICAL  02/15/2016   IR VERTEBROPLASTY CERV/THOR BX INC UNI/BIL INC/INJECT/IMAGING 02/15/2016 Luanne Bras, MD [facility]  . IR GENERIC HISTORICAL  03/13/2016   IR KYPHO EA ADDL LEVEL THORACIC OR LUMBAR 03/13/2016 Luanne Bras, MD [facility]  . IR GENERIC HISTORICAL  03/13/2016   IR KYPHO EA ADDL LEVEL THORACIC OR LUMBAR  03/13/2016 Luanne Bras, MD [facility]  . IR GENERIC HISTORICAL  03/13/2016   IR BONE TUMOR(S)RF ABLATION 03/13/2016 Luanne Bras, MD [facility]  . IR GENERIC HISTORICAL  03/13/2016   IR KYPHO LUMBAR INC FX REDUCE BONE BX UNI/BIL CANNULATION INC/IMAGING 03/13/2016 Luanne Bras, MD [facility]  . IR GENERIC HISTORICAL  03/13/2016   IR BONE TUMOR(S)RF ABLATION 03/13/2016 Luanne Bras, MD [facility]  . IR GENERIC HISTORICAL  03/13/2016   IR BONE TUMOR(S)RF ABLATION 03/13/2016 Luanne Bras, MD [facility]  . IR GENERIC HISTORICAL  03/31/2016   IR RADIOLOGIST EVAL & MGMT 03/31/2016 [facility]  . LAPAROSCOPIC INGUINAL HERNIA REPAIR Bilateral 12-16-2013  dr gross  . RADIOLOGY WITH ANESTHESIA N/A 02/15/2016   Procedure: Spinal Ablation;  Surgeon: Luanne Bras, MD;  Location: [facility];  Service: Radiology;  Laterality: N/A;  . RADIOLOGY WITH ANESTHESIA N/A 03/13/2016   Procedure: LUMBER ABLATION;  Surgeon: Luanne Bras, MD;  Location: [facility];  Service: Radiology;  Laterality: N/A;  . ROTATOR CUFF REPAIR Right 2003  . TONSILLECTOMY  age 22  . WISDOM TOOTH EXTRACTION      There were no vitals filed for this visit.      Subjective Assessment - 01/01/17 1625    Subjective Pt noting slightly increased pain today but feels it is due to not slowing down much over the past 2-3 weeks while simultaneously trying to wean from fentanyl patch (changing patch every 3 days instead of 2 - today is day #3 of current patch).   Pertinent History T6 & T8 kypholoplasty on 11/21/16. Multiple myeloma with multiple vertebral compression fractures with kyphoplasties and vertbroloplasties. Stem cell tranplant on 08/22/16 at [facility]. He follows up regularly with Dr. Marin Olp. He is referred for OP PT for "eval & treat, therapeutic exercises: strengthing/conditioning, therapeutic activities: conditioning"   Limitations Sitting;Standing;Walking   Patient Stated Goals "gain more strength in legs and  back"   Currently in Pain? Yes   Pain Score 3    Pain Location Back   Pain Orientation Mid;Medial;Right   Pain Descriptors / Indicators Aching                         [facility] Adult PT Treatment/Exercise - 01/01/17 1618      Lumbar Exercises: Aerobic   Stationary Bike NuStep - L4 x 6'     Lumbar Exercises: Standing   Row Both;15 reps;Theraband;Strengthening   Theraband Level (Row) Level 3 (Green)   Row Limitations staggered stance, 3" hold   Shoulder Extension Both;15 reps;Theraband;Strengthening   Theraband Level (Shoulder Extension) Level 3 (Green)   Shoulder Extension Limitations to neutral + scap retraction, staggered stance, 3" hold   Other Standing Lumbar Exercises upper trunk extension into pool noodle on wall 15x3"     Lumbar Exercises: Seated   Long Arc Quad on [employer] Both;15 reps   LAQ on [employer] (lbs) green Pball   Hip Flexion on Ball Both;15 reps   Hip Flexion on Ball Limitations green Pball     Lumbar Exercises: Supine  Clam 15 reps;3 seconds   Clam Limitations alt hip ABD/ER with green TB   Bent Knee Raise 15 reps;3 seconds   Bent Knee Raise Limitations brace marching with green TB     Lumbar Exercises: Sidelying   Clam 15 reps;3 seconds   Clam Limitations Bil with green TB     Lumbar Exercises: Quadruped   Madcat/Old Horse 10 reps   Madcat/Old Horse Limitations 3" hold   Single Arm Raise Right;Left;10 reps;3 seconds   Straight Leg Raise 10 reps;3 seconds     Shoulder Exercises: Supine   Horizontal ABduction Both;15 reps;Theraband;Strengthening   Theraband Level (Shoulder Horizontal ABduction) Level 3 (Green)   Horizontal ABduction Limitations hooklying + TrA   Other Supine Exercises TrA + hooklying alt shoulder flexion + opp shoulder extension with green TB x15                  PT Short Term Goals - 12/29/16 1711      PT SHORT TERM GOAL #1   Title independent with initial HEP by 01/09/17   Status On-going            PT Long Term Goals - 12/29/16 1711      PT LONG TERM GOAL #1   Title Independent with ongoing HEP +/- gym program to increase back extension & LE strength and decrease pain by 02/20/17   Status On-going     PT LONG TERM GOAL #2   Title B LE strength >/= 4+/5 for improved ease of mobility by 02/20/17   Status On-going     PT LONG TERM GOAL #3   Title Pt will tolerate standing in the kitchen for >/= 45 minutes w/o limitation due to back pain to allow for meal prep by 02/20/17   Status On-going     PT LONG TERM GOAL #4   Title Pt will be able to perform a deep squat with good body mechanics to allow safe retrieval of objects from floor by 02/20/17   Status On-going               Plan - 01/01/17 1630    Clinical Impression Statement Pt demonstrating improved thoracic extension resulting in improved upright posture. He has been able to resume a good majority of his pre-kyphoplasty exercises with some slight reduction in intensity but good tolerance other than expeceted fatigue reported.   Rehab Potential Good   Clinical Impairments Affecting Rehab Potential T6 & T8 kypholoplasty on 11/21/16. Multiple myeloma with multiple vertebral compression fractures with kyphoplasties and vertbroloplasties. Stem cell transplant on 08/22/16 at [facility]. Regular ongoing f/u with oncologist, Dr. Marin Olp. Due to extensive vertebral involvement and compression fractures, will avoid all flexion based activity and lifting >10 pounds.    PT Treatment/Interventions Patient/family education;ADLs/Self Care Home Management;Energy conservation;Neuromuscular re-education;Therapeutic exercise;Therapeutic activities;Functional mobility training;Gait training;Balance training;Moist Heat;Cryotherapy;Manual techniques;Taping;Dry needling   PT Next Visit Plan LE flexibility including manual therapy and possible dry needling to address muscular tightness; core/lumbar stabilization/strengthening exercises; postural training; LE  strengthening   Consulted and Agree with Plan of Care Patient      Patient will benefit from skilled therapeutic intervention in order to improve the following deficits and impairments:  Pain, Impaired flexibility, Increased muscle spasms, Decreased range of motion, Decreased strength, Decreased endurance, Decreased activity tolerance, Decreased mobility, Postural dysfunction, Improper body mechanics, Impaired perceived functional ability, Decreased knowledge of precautions  Visit Diagnosis: Abnormal posture  Muscle weakness (generalized)  Pain in thoracic spine  Cramp and spasm  Problem List Patient Active Problem List   Diagnosis Date Noted  . Phlebitis or thrombophlebitis of lower extremity 07/21/2016  . Visit for preventive health examination 05/16/2016  . Aphthous ulcer 05/16/2016  . Ureterolithiasis 05/16/2016  . Acute deep vein thrombosis (DVT) of femoral vein of left lower extremity ([place]) 03/21/2016  . Multiple myeloma ([place]) 02/22/2016  . Multiple myeloma not having achieved remission ([place]) 02/22/2016  . LBP (low back pain) 11/02/2013  . Bilateral inguinal hernia (BIH), s/p lap repair 12/16/2013 11/02/2013    Percival Spanish, PT, MPT 01/01/2017, 6:16 PM  [facility] [address]  [place], [place], [address] Phone: [phone]   Fax:  [phone]  Name: Jorge [name] MRN: [id number] Date of Birth: [birth year]

## 2017-01-02 ENCOUNTER — Other Ambulatory Visit: Payer: 59

## 2017-01-05 ENCOUNTER — Ambulatory Visit (HOSPITAL_BASED_OUTPATIENT_CLINIC_OR_DEPARTMENT_OTHER): Payer: 59 | Admitting: Hematology & Oncology

## 2017-01-05 ENCOUNTER — Ambulatory Visit: Payer: 59 | Admitting: Physical Therapy

## 2017-01-05 ENCOUNTER — Ambulatory Visit (HOSPITAL_BASED_OUTPATIENT_CLINIC_OR_DEPARTMENT_OTHER): Payer: 59

## 2017-01-05 ENCOUNTER — Other Ambulatory Visit (HOSPITAL_BASED_OUTPATIENT_CLINIC_OR_DEPARTMENT_OTHER): Payer: 59

## 2017-01-05 DIAGNOSIS — C9000 Multiple myeloma not having achieved remission: Secondary | ICD-10-CM

## 2017-01-05 DIAGNOSIS — R252 Cramp and spasm: Secondary | ICD-10-CM

## 2017-01-05 DIAGNOSIS — R293 Abnormal posture: Secondary | ICD-10-CM

## 2017-01-05 DIAGNOSIS — Z23 Encounter for immunization: Secondary | ICD-10-CM

## 2017-01-05 DIAGNOSIS — I8290 Acute embolism and thrombosis of unspecified vein: Secondary | ICD-10-CM

## 2017-01-05 DIAGNOSIS — G8929 Other chronic pain: Secondary | ICD-10-CM

## 2017-01-05 DIAGNOSIS — M545 Low back pain: Secondary | ICD-10-CM

## 2017-01-05 DIAGNOSIS — M7989 Other specified soft tissue disorders: Secondary | ICD-10-CM

## 2017-01-05 DIAGNOSIS — Z9484 Stem cells transplant status: Secondary | ICD-10-CM

## 2017-01-05 DIAGNOSIS — M546 Pain in thoracic spine: Secondary | ICD-10-CM

## 2017-01-05 DIAGNOSIS — M6281 Muscle weakness (generalized): Secondary | ICD-10-CM

## 2017-01-05 DIAGNOSIS — D472 Monoclonal gammopathy: Secondary | ICD-10-CM

## 2017-01-05 LAB — CMP (CANCER CENTER ONLY)
ALT(SGPT): 37 U/L (ref 10–47)
AST: 36 U/L (ref 11–38)
Albumin: 3.6 g/dL (ref 3.3–5.5)
Alkaline Phosphatase: 47 U/L (ref 26–84)
BUN: 10 mg/dL (ref 7–22)
CALCIUM: 8.3 mg/dL (ref 8.0–10.3)
CHLORIDE: 106 meq/L (ref 98–108)
CO2: 28 mEq/L (ref 18–33)
Creat: 1.1 mg/dl (ref 0.6–1.2)
Glucose, Bld: 107 mg/dL (ref 73–118)
POTASSIUM: 4.1 meq/L (ref 3.3–4.7)
Sodium: 137 mEq/L (ref 128–145)
TOTAL PROTEIN: 6.2 g/dL — AB (ref 6.4–8.1)
Total Bilirubin: 0.7 mg/dl (ref 0.20–1.60)

## 2017-01-05 LAB — CBC WITH DIFFERENTIAL (CANCER CENTER ONLY)
BASO#: 0 10*3/uL (ref 0.0–0.2)
BASO%: 0.7 % (ref 0.0–2.0)
EOS ABS: 0.4 10*3/uL (ref 0.0–0.5)
EOS%: 13.8 % — ABNORMAL HIGH (ref 0.0–7.0)
HCT: 35.1 % — ABNORMAL LOW (ref 38.7–49.9)
HGB: 11.3 g/dL — ABNORMAL LOW (ref 13.0–17.1)
LYMPH#: 0.9 10*3/uL (ref 0.9–3.3)
LYMPH%: 30.3 % (ref 14.0–48.0)
MCH: 28.7 pg (ref 28.0–33.4)
MCHC: 32.2 g/dL (ref 32.0–35.9)
MCV: 89 fL (ref 82–98)
MONO#: 0.5 10*3/uL (ref 0.1–0.9)
MONO%: 17.6 % — ABNORMAL HIGH (ref 0.0–13.0)
NEUT#: 1.1 10*3/uL — ABNORMAL LOW (ref 1.5–6.5)
NEUT%: 37.6 % — AB (ref 40.0–80.0)
PLATELETS: 161 10*3/uL (ref 145–400)
RBC: 3.94 10*6/uL — AB (ref 4.20–5.70)
RDW: 13.9 % (ref 11.1–15.7)
WBC: 2.9 10*3/uL — ABNORMAL LOW (ref 4.0–10.0)

## 2017-01-05 LAB — LACTATE DEHYDROGENASE: LDH: 195 U/L (ref 125–245)

## 2017-01-05 MED ORDER — LENALIDOMIDE 10 MG PO CAPS: ORAL_CAPSULE | ORAL | 6 refills | Status: DC

## 2017-01-05 MED ORDER — RIVAROXABAN 10 MG PO TABS: 10.0000 mg | ORAL_TABLET | Freq: Every day | ORAL | 12 refills | Status: DC

## 2017-01-05 MED ORDER — ZOSTER VAC RECOMB ADJUVANTED 50 MCG/0.5ML IM SUSR
0.5000 mL | Freq: Once | INTRAMUSCULAR | Status: AC
  Administered 2017-01-05: 0.5 mL via INTRAMUSCULAR
  Filled 2017-01-05: qty 0.5

## 2017-01-05 MED ORDER — ZOLEDRONIC ACID 4 MG/100ML IV SOLN
4.0000 mg | Freq: Once | INTRAVENOUS | Status: AC
  Administered 2017-01-05: 4 mg via INTRAVENOUS
  Filled 2017-01-05: qty 100

## 2017-01-05 MED ORDER — FENTANYL 25 MCG/HR TD PT72: 25.0000 ug | MEDICATED_PATCH | TRANSDERMAL | 0 refills | Status: DC

## 2017-01-05 NOTE — Progress Notes (Signed)
Hematology and Oncology Follow Up Visit  Jorge [name] [id number] [birth year] 61 y.o. 01/05/2017   Principle Diagnosis:  IgG Kappa myeloma - Hyperdiploid/+11 DVT of the LEFT and RIGHT leg  Current Therapy:   Autologous stem cell transplant at [facility] - infused on 08/22/2016 Revlimid 25 mg po q day (21/7) x 2 months, then 10 mg po q day Zometa 4 mg IV q 3 months - next dose is 02/2017 Xarelto 20 mg PO daily -  to change to  10 mg by mouth daily in one month   Interim History:  Jorge [name] is here today for follow-up.  He really looks quite good. He is gaining some weight. He is walking more. He is not hurting as much. I think he had another kyphoplasty for his lower back. Is on a fentanyl patch. He's trying to cut the dose back a little bit. This seems to be working fairly well.  He's been on the high dose of Revlimid 1 month. He'll have another month and then we will get him on the maintenance dose of 10 mg.  He's not had any fever. He's had no rashes. He's had no leg swelling.  He is on Xarelto. He had a Doppler back in March. This showed only a nonocclusive thrombus in a greater saphenous vein. He's had no problems with pain in the right leg. I want to switch him over to maintenance Revlimid which I think would be reasonable for him.  Back in  Me, his M spike was 0.3 g/dL. His IgG level was 569 milligrams per deciliter. His Kappa light chain was 0.9 mg/dL.  His appetite is good. He's had no problems with nausea or vomiting. He has had no fever. He has had no dysphagia or odynophagia.  He is not working. 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   Overall, his performance status is ECOG 1.   Medications:  Allergies as of 01/05/2017   No Known Allergies     Medication List       Accurate as of 01/05/17 11:02 AM. Always use your most recent med list.          calcium carbonate 500 MG chewable tablet Commonly known as:  TUMS - dosed in mg elemental calcium Chew 1 tablet by mouth as  needed for indigestion or heartburn.   carisoprodol 350 MG tablet Commonly known as:  SOMA Take 1 tablet (350 mg total) by mouth 4 (four) times daily as needed for muscle spasms.   cyclobenzaprine 10 MG tablet Commonly known as:  FLEXERIL Take 10 mg by mouth daily as needed for muscle spasms.   famciclovir 500 MG tablet Commonly known as:  FAMVIR Take 1 tablet (500 mg total) by mouth daily.   fentaNYL 25 MCG/HR patch Commonly known as:  DURAGESIC - dosed mcg/hr Place 1 patch (25 mcg total) onto the skin every 3 (three) days.   HYDROcodone-acetaminophen 10-325 MG tablet Commonly known as:  NORCO Take 0.5-1 tablets by mouth every 8 (eight) hours as needed for moderate pain.   lenalidomide 10 MG capsule Commonly known as:  REVLIMID TAKE 1 CAPSULE BY MOUTH EVERY DAY FOR 21 DAYS, THEN 7 DAYS OFF. [id number]   lidocaine 5 % Commonly known as:  LIDODERM PLAEC 1 PATCH ONTO THE SKIN DAILY.APPLY 1 PATCH TO THE MOST PAINFUL AREA FOR 12 HR IN A 24 HR PERIOD   magnesium oxide 400 MG tablet Commonly known as:  MAG-OX Take 400 mg by mouth daily.   ondansetron 8 MG  disintegrating tablet Commonly known as:  ZOFRAN-ODT Take 1 tablet (8 mg total) by mouth every 8 (eight) hours as needed.   Oxycodone HCl 10 MG Tabs Take 1 tablet (10 mg total) by mouth every 4 (four) hours as needed.   polyethylene glycol powder powder Commonly known as:  MIRALAX Take 17 g by mouth daily.   PROBIOTIC PO Take 1 capsule by mouth daily.   ranitidine 150 MG tablet Commonly known as:  ZANTAC Take 150 mg by mouth as needed for heartburn.   rivaroxaban 20 MG Tabs tablet Commonly known as:  XARELTO Take 1 tablet (20 mg total) by mouth daily with supper.   senna 8.6 MG Tabs tablet Commonly known as:  SENOKOT Take 2 tablets (17.2 mg total) by mouth daily.   sertraline 100 MG tablet Commonly known as:  ZOLOFT Take 100 mg by mouth at bedtime.   Vitamin D-3 5000 units Tabs Take 5,000 mg by mouth  daily.   zolpidem 10 MG tablet Commonly known as:  AMBIEN Take 1 tablet (10 mg total) by mouth at bedtime as needed.       Allergies: No Known Allergies  Past Medical History, Surgical history, Social history, and Family History were reviewed and updated.  Review of Systems: All other 10 point review of systems is negative.   Physical Exam:  weight is 162 lb (73.5 kg). His oral temperature is 98.2 F (36.8 C). His blood pressure is 107/60 and his pulse is 67. His respiration is 18 and oxygen saturation is 98%.   Wt Readings from Last 3 Encounters:  01/05/17 162 lb (73.5 kg)  12/16/16 161 lb (73 kg)  12/04/16 161 lb (73 kg)     Fairly well-developed and well-nourished white male. He is in no distress. Head and neck exam shows no ocular or oral lesions. He does have some temporal muscle wasting. He has no palpable cervical or supraclavicular lymph nodes. He has no palpable thyroid. Lungs are clear bilaterally. Cardiac exam regular rate and rhythm with no murmurs, rubs or bruits. Abdomen is soft. He has good bowel sounds. There is no fluid wave. There is no palpable liver or spleen tip. Back exam shows some tenderness in the midthoracic spine. There is some tenderness along the right thoracic paravertebral muscles. Extremities shows no clubbing, cyanosis or edema. He has good range of motion of his joints. He has good strength in his extremities. Neurological exam shows no focal neurological deficits. Skin exam shows no rashes, ecchymoses or petechia. d   Lab Results  Component Value Date   WBC 2.9 (L) 01/05/2017   HGB 11.3 (L) 01/05/2017   HCT 35.1 (L) 01/05/2017   MCV 89 01/05/2017   PLT 161 01/05/2017   No results found for: FERRITIN, IRON, TIBC, UIBC, IRONPCTSAT Lab Results  Component Value Date   RBC 3.94 (L) 01/05/2017   Lab Results  Component Value Date   KAPLAMBRATIO 2.59 (H) 12/04/2016   Lab Results  Component Value Date   IGGSERUM 569 (L) 12/04/2016   IGMSERUM  22 12/04/2016   Lab Results  Component Value Date   MSPIKE 0.5 (H) 07/28/2016     Chemistry      Component Value Date/Time   NA 137 01/05/2017 0933   NA 140 10/03/2016 0845   K 4.1 01/05/2017 0933   K 4.3 10/03/2016 0845   CL 106 01/05/2017 0933   CO2 28 01/05/2017 0933   CO2 26 10/03/2016 0845   BUN 10 01/05/2017 0933  BUN 11.9 10/03/2016 0845   CREATININE 1.1 01/05/2017 0933   CREATININE 0.9 10/03/2016 0845      Component Value Date/Time   CALCIUM 8.3 01/05/2017 0933   CALCIUM 9.9 10/03/2016 0845   ALKPHOS 47 01/05/2017 0933   ALKPHOS 80 10/03/2016 0845   AST 36 01/05/2017 0933   AST 28 10/03/2016 0845   ALT 37 01/05/2017 0933   ALT 26 10/03/2016 0845   BILITOT 0.70 01/05/2017 0933   BILITOT 0.48 10/03/2016 0845      Impression and Plan: Jorge [name] is a very pleasant 61 yo caucasian male with IgG kappa myeloma.   he had his autologous stem cell transplant on 08/22/2016. He tolerated this quite well. He did have a pilonidal cyst that was opened up. He was given antibiotics for this.  He is on  Revlimid. He is on the full dose Revlimid. Has 1 more month of this and then he will go down to 10 mg daily.  He will get his Zometa today.  He sees the orthopedist at [facility]. They are trying to help manage the compression fractures. Hopefully this will continue to improve.   I will see him back in 3 months. I think this would be reasonable. If there are any problems before then, we will be more happy to see him back.   I spent about 30 minutes with he and his wife.  [name], MD 6/25/201811:02 AM

## 2017-01-05 NOTE — Progress Notes (Signed)
12:09 PM  VIS given to patient.

## 2017-01-05 NOTE — Therapy (Signed)
[facility] [address]  [place], [place], [address] Phone: [phone]   Fax:  [phone]  Physical Therapy Treatment  Patient Details  Name: Jorge [name] MRN: [id number] Date of Birth: Jul 02, 1956 Referring Provider: Durenda Age, MD  Encounter Date: 01/05/2017      PT End of Session - 01/05/17 1618    Visit Number 4   Number of Visits 16   Date for PT Re-Evaluation 02/20/17   Authorization Type [employer]   PT Start Time 1618   PT Stop Time 1658   PT Time Calculation (min) 40 min   Activity Tolerance Patient tolerated treatment well   Behavior During Therapy [facility] for tasks assessed/performed      Past Medical History:  Diagnosis Date  . Anxiety   . Bone metastasis ([place])   . Chest cold 05/19/2016   productive cough  -- started on antibiotic  . Chronic back pain    due to bone mets from myeloma  . Cough   . Depression   . GERD (gastroesophageal reflux disease)   . Hiatal hernia   . History of chicken pox   . History of concussion    age 19 -- no residual  . History of DVT of lower extremity 03/21/2016  treated and completed w/ xarelto   per doppler left extensive occlusion common femoral, femoral, and popliteal veins and right partial occlusion common femoral and profunda femoral veins/  last doppler 06-04-2016 no evidence acute or chronic dvt noted either leg   . History of radiation therapy 06/09/16-06/23/16   lower thoracic spine 25 Gy in 10 fractions  . Mouth ulcers    secondary to radiation  . Multiple myeloma ([place]) dx 02/22/2016 via bone marrow bx---  oncologist-  [name]   IgG Kappa-- Hyperdiploid/ +11 w/ bone mets--  current treatment chemotherapy (started 08/ 2017)and pallitive radiation to back started 06-09-2016  . Renal calculus, right   . Wears contact lenses     Past Surgical History:  Procedure Laterality Date  . COLONOSCOPY  [id number]  . CYSTOSCOPY W/ URETERAL STENT  PLACEMENT Right 06/20/2016   Procedure: CYSTOSCOPY WITH STENT REPLACEMENT;  Surgeon: Kathie Rhodes, MD;  Location: [facility];  Service: Urology;  Laterality: Right;  . CYSTOSCOPY WITH RETROGRADE PYELOGRAM, URETEROSCOPY AND STENT PLACEMENT Right 05/30/2016   Procedure: CYSTOSCOPY WITH RETROGRADE PYELOGRAM, URETEROSCOPY AND STENT PLACEMENT,DILITATION URETERAL STRICTURE;  Surgeon: Kathie Rhodes, MD;  Location: [facility];  Service: Urology;  Laterality: Right;  . CYSTOSCOPY/RETROGRADE/URETEROSCOPY/STONE EXTRACTION WITH BASKET Right 06/20/2016   Procedure: CYSTOSCOPY/URETEROSCOPY/STONE EXTRACTION WITH BASKET;  Surgeon: Kathie Rhodes, MD;  Location: [facility];  Service: Urology;  Laterality: Right;  . HOLMIUM LASER APPLICATION Right 49/10/4965   Procedure: HOLMIUM LASER APPLICATION;  Surgeon: Kathie Rhodes, MD;  Location: [facility];  Service: Urology;  Laterality: Right;  . IR GENERIC HISTORICAL  02/11/2016   IR RADIOLOGIST EVAL & MGMT 02/11/2016 [facility]  . IR GENERIC HISTORICAL  02/15/2016   IR BONE TUMOR(S)RF ABLATION 02/15/2016 Luanne Bras, MD [facility]  . IR GENERIC HISTORICAL  02/15/2016   IR BONE TUMOR(S)RF ABLATION 02/15/2016 Luanne Bras, MD [facility]  . IR GENERIC HISTORICAL  02/15/2016   IR BONE TUMOR(S)RF ABLATION 02/15/2016 Luanne Bras, MD [facility]  . IR GENERIC HISTORICAL  02/15/2016   IR KYPHO THORACIC WITH BONE BIOPSY 02/15/2016 Luanne Bras, MD [facility]  . IR GENERIC HISTORICAL  02/15/2016   IR KYPHO  THORACIC WITH BONE BIOPSY 02/15/2016 Luanne Bras, MD [facility]  . IR GENERIC HISTORICAL  02/15/2016   IR VERTEBROPLASTY CERV/THOR BX INC UNI/BIL INC/INJECT/IMAGING 02/15/2016 Luanne Bras, MD [facility]  . IR GENERIC HISTORICAL  03/13/2016   IR KYPHO EA ADDL LEVEL THORACIC OR LUMBAR 03/13/2016 Luanne Bras, MD [facility]  . IR GENERIC HISTORICAL  03/13/2016   IR KYPHO EA ADDL LEVEL THORACIC OR LUMBAR  03/13/2016 Luanne Bras, MD [facility]  . IR GENERIC HISTORICAL  03/13/2016   IR BONE TUMOR(S)RF ABLATION 03/13/2016 Luanne Bras, MD [facility]  . IR GENERIC HISTORICAL  03/13/2016   IR KYPHO LUMBAR INC FX REDUCE BONE BX UNI/BIL CANNULATION INC/IMAGING 03/13/2016 Luanne Bras, MD [facility]  . IR GENERIC HISTORICAL  03/13/2016   IR BONE TUMOR(S)RF ABLATION 03/13/2016 Luanne Bras, MD [facility]  . IR GENERIC HISTORICAL  03/13/2016   IR BONE TUMOR(S)RF ABLATION 03/13/2016 Luanne Bras, MD [facility]  . IR GENERIC HISTORICAL  03/31/2016   IR RADIOLOGIST EVAL & MGMT 03/31/2016 [facility]  . LAPAROSCOPIC INGUINAL HERNIA REPAIR Bilateral 12-16-2013  dr gross  . RADIOLOGY WITH ANESTHESIA N/A 02/15/2016   Procedure: Spinal Ablation;  Surgeon: Luanne Bras, MD;  Location: [facility];  Service: Radiology;  Laterality: N/A;  . RADIOLOGY WITH ANESTHESIA N/A 03/13/2016   Procedure: LUMBER ABLATION;  Surgeon: Luanne Bras, MD;  Location: [facility];  Service: Radiology;  Laterality: N/A;  . ROTATOR CUFF REPAIR Right 2003  . TONSILLECTOMY  age 29  . WISDOM TOOTH EXTRACTION      There were no vitals filed for this visit.      Subjective Assessment - 01/05/17 1629    Subjective Pt stating "doing really good today". Able to walk 5 miles on Sat.   Pertinent History T6 & T8 kypholoplasty on 11/21/16. Multiple myeloma with multiple vertebral compression fractures with kyphoplasties and vertbroloplasties. Stem cell tranplant on 08/22/16 at [facility]. He follows up regularly with Dr. Marin Olp. He is referred for OP PT for "eval & treat, therapeutic exercises: strengthing/conditioning, therapeutic activities: conditioning"   Limitations Sitting;Standing;Walking   Patient Stated Goals "gain more strength in legs and back"   Currently in Pain? Yes   Pain Score 1                          [facility] Adult PT Treatment/Exercise - 01/05/17 1618      Lumbar Exercises: Stretches    Hip Flexor Stretch 30 seconds;2 reps  each   Hip Flexor Stretch Limitations mod thomas with strap & 1/2 kneel lunge     Lumbar Exercises: Aerobic   Stationary Bike lvl 2 x 6'     Lumbar Exercises: Machines for Strengthening   Other Lumbar Machine Exercise BATCA low row 25# x15     Lumbar Exercises: Standing   Wall Slides 15 reps;3 seconds   Wall Slides Limitations on green Pball on wall     Lumbar Exercises: Seated   Other Seated Lumbar Exercises B row & shoulder extension with green TB 20x3', B pallof press 10x3" with single green TB seated on green (65 cm) Pball; cues to maintain neutral pelvis position     Lumbar Exercises: Quadruped   Opposite Arm/Leg Raise Right arm/Left leg;Left arm/Right leg;10 reps;2 seconds   Other Quadruped Lumbar Exercises Prone rocking into cobra pose 10x5"     Knee/Hip Exercises: Standing   Hip Flexion Both;10 reps;Knee straight;Stengthening   Hip Flexion Limitations green TB with 3"  hold, 1 pole support   Hip ADduction Both;10 reps;Strengthening   Hip ADduction Limitations green TB with 3" hold, 1 pole support   Hip Abduction Both;10 reps;Knee straight;Stengthening   Abduction Limitations green TB with 3" hold, 1 pole support   Hip Extension Both;10 reps;Knee straight;Stengthening                PT Education - 01/05/17 1658    Education provided Yes   Education Details Hip flexor stretches   Person(s) Educated Patient   Methods Explanation;Demonstration;Handout   Comprehension Verbalized understanding;Returned demonstration          PT Short Term Goals - 01/05/17 1639      PT SHORT TERM GOAL #1   Title independent with initial HEP by 01/09/17   Status Achieved           PT Long Term Goals - 12/29/16 1711      PT LONG TERM GOAL #1   Title Independent with ongoing HEP +/- gym program to increase back extension & LE strength and decrease pain by 02/20/17   Status On-going     PT LONG TERM GOAL #2   Title B LE strength  >/= 4+/5 for improved ease of mobility by 02/20/17   Status On-going     PT LONG TERM GOAL #3   Title Pt will tolerate standing in the kitchen for >/= 45 minutes w/o limitation due to back pain to allow for meal prep by 02/20/17   Status On-going     PT LONG TERM GOAL #4   Title Pt will be able to perform a deep squat with good body mechanics to allow safe retrieval of objects from floor by 02/20/17   Status On-going               Plan - 01/05/17 1639    Clinical Impression Statement Pt reporting lessening pain and improving activity tolerance, able to walk 5 miles on Sat w/o increase in pain. Pt demonstrating limited hip extension with quadruped and standing exercises, with restrictions due to continued hip flexor tightness, therefore reviewed hip flexor stretch and instructed pt in alternate versions of stretch with pt noting good stretch for each.   Rehab Potential Good   Clinical Impairments Affecting Rehab Potential T6 & T8 kypholoplasty on 11/21/16. Multiple myeloma with multiple vertebral compression fractures with kyphoplasties and vertbroloplasties. Stem cell transplant on 08/22/16 at [facility]. Regular ongoing f/u with oncologist, Dr. Marin Olp. Due to extensive vertebral involvement and compression fractures, will avoid all flexion based activity and lifting >10 pounds.    PT Treatment/Interventions Patient/family education;ADLs/Self Care Home Management;Energy conservation;Neuromuscular re-education;Therapeutic exercise;Therapeutic activities;Functional mobility training;Gait training;Balance training;Moist Heat;Cryotherapy;Manual techniques;Taping;Dry needling   PT Next Visit Plan LE flexibility including manual therapy and possible dry needling to address muscular tightness; core/lumbar stabilization/strengthening exercises; postural training; LE strengthening   Consulted and Agree with Plan of Care Patient      Patient will benefit from skilled therapeutic intervention in order to  improve the following deficits and impairments:  Pain, Impaired flexibility, Increased muscle spasms, Decreased range of motion, Decreased strength, Decreased endurance, Decreased activity tolerance, Decreased mobility, Postural dysfunction, Improper body mechanics, Impaired perceived functional ability, Decreased knowledge of precautions  Visit Diagnosis: Abnormal posture  Muscle weakness (generalized)  Pain in thoracic spine  Cramp and spasm     Problem List Patient Active Problem List   Diagnosis Date Noted  . Phlebitis or thrombophlebitis of lower extremity 07/21/2016  . Visit for preventive health examination  05/16/2016  . Aphthous ulcer 05/16/2016  . Ureterolithiasis 05/16/2016  . Acute deep vein thrombosis (DVT) of femoral vein of left lower extremity ([place]) 03/21/2016  . Multiple myeloma ([place]) 02/22/2016  . Multiple myeloma not having achieved remission ([place]) 02/22/2016  . LBP (low back pain) 11/02/2013  . Bilateral inguinal hernia (BIH), s/p lap repair 12/16/2013 11/02/2013    Percival Spanish, PT, MPT 01/05/2017, 6:10 PM  [facility] [address]  [place], [place], [address] Phone: [phone]   Fax:  [phone]  Name: Jorge [name] MRN: [id number] Date of Birth: 1955-08-12

## 2017-01-05 NOTE — Patient Instructions (Signed)
Recombinant Zoster (Shingles) Vaccine, RZV: What You Need to Know 1. Why get vaccinated? Shingles (also called herpes zoster, or just zoster) is a painful skin rash, often with blisters. Shingles is caused by the varicella zoster virus, the same virus that causes chickenpox. After you have chickenpox, the virus stays in your body and can cause shingles later in life. You can't catch shingles from another person. However, a person who has never had chickenpox (or chickenpox vaccine) could get chickenpox from someone with shingles. A shingles rash usually appears on one side of the face or body and heals within 2 to 4 weeks. Its main symptom is pain, which can be severe. Other symptoms can include fever, headache, chills and upset stomach. Very rarely, a shingles infection can lead to pneumonia, hearing problems, blindness, brain inflammation (encephalitis), or death. For about 1 person in 5, severe pain can continue even long after the rash has cleared up. This long-lasting pain is called post-herpetic neuralgia (PHN). Shingles is far more common in people 40 years of age and older than in younger people, and the risk increases with age. It is also more common in people whose immune system is weakened because of a disease such as cancer, or by drugs such as steroids or chemotherapy. At least 1 million people a year in the [place] get shingles. 2. Shingles vaccine (recombinant) Recombinant shingles vaccine was approved by FDA in 2017 for the prevention of shingles. In clinical trials, it was more than 90% effective in preventing shingles. It can also reduce the likelihood of PHN. Two doses, 2 to 6 months apart, are recommended for adults 78 and older. This vaccine is also recommended for people who have already gotten the live shingles vaccine (Zostavax). There is no live virus in this vaccine. 3. Some people should not get this vaccine Tell your vaccine provider if you:  Have any severe,  life-threatening allergies. A person who has ever had a life-threatening allergic reaction after a dose of recombinant shingles vaccine, or has a severe allergy to any component of this vaccine, may be advised not to be vaccinated. Ask your health care provider if you want information about vaccine components.  Are pregnant or breastfeeding. There is not much information about use of recombinant shingles vaccine in pregnant or nursing women. Your healthcare provider might recommend delaying vaccination.  Are not feeling well. If you have a mild illness, such as a cold, you can probably get the vaccine today. If you are moderately or severely ill, you should probably wait until you recover. Your doctor can advise you.  4. Risks of a vaccine reaction With any medicine, including vaccines, there is a chance of reactions. After recombinant shingles vaccination, a person might experience:  Pain, redness, soreness, or swelling at the site of the injection  Headache, muscle aches, fever, shivering, fatigue  In clinical trials, most people got a sore arm with mild or moderate pain after vaccination, and some also had redness and swelling where they got the shot. Some people felt tired, had muscle pain, a headache, shivering, fever, stomach pain, or nausea. About 1 out of 6 people who got recombinant zoster vaccine experienced side effects that prevented them from doing regular activities. Symptoms went away on their own in about 2 to 3 days. Side effects were more common in younger people. You should still get the second dose of recombinant zoster vaccine even if you had one of these reactions after the first dose. Other things that  could happen after this vaccine:  People sometimes faint after medical procedures, including vaccination. Sitting or lying down for about 15 minutes can help prevent fainting and injuries caused by a fall. Tell your provider if you feel dizzy or have vision changes or ringing  in the ears.  Some people get shoulder pain that can be more severe and longer-lasting than routine soreness that can follow injections. This happens very rarely.  Any medication can cause a severe allergic reaction. Such reactions to a vaccine are estimated at about 1 in a million doses, and would happen within a few minutes to a few hours after the vaccination. As with any medicine, there is a very remote chance of a vaccine causing a serious injury or death. The safety of vaccines is always being monitored. For more information, visit: http://www.aguilar.org/ 5. What if there is a serious problem? What should I look for?  Look for anything that concerns you, such as signs of a severe allergic reaction, very high fever, or unusual behavior. Signs of a severe allergic reaction can include hives, swelling of the face and throat, difficulty breathing, a fast heartbeat, dizziness, and weakness. These would usually start a few minutes to a few hours after the vaccination. What should I do?  If you think it is a severe allergic reaction or other emergency that can't wait, call 9-1-1 and get to the nearest hospital. Otherwise, call your health care provider. Afterward, the reaction should be reported to the Vaccine Adverse Event Reporting System (VAERS). Your doctor should file this report, or you can do it yourself through the VAERS web site atwww.vaers.https://www.bray.com/ by calling 2173012600. VAERS does not give medical advice. 6. How can I learn more?  Ask your healthcare provider. He or she can give you the vaccine package insert or suggest other sources of information.  Call your local or state health department.  Contact the Centers for Disease Control and Prevention (CDC): ? Call (419)312-4493 (1-800-CDC-INFO) or ? Visit the CDC's website at http://hunter.com/ CDC Vaccine Information Statement (VIS) Recombinant Zoster Vaccine (08/25/2016) This information is not intended to replace  advice given to you by your health care provider. Make sure you discuss any questions you have with your health care provider. Document Released: 09/09/2016 Document Revised: 09/09/2016 Document Reviewed: 09/09/2016 Elsevier Interactive Patient Education  2018 [place]. Zoledronic Acid injection (Hypercalcemia, Oncology) What is this medicine? ZOLEDRONIC ACID (ZOE le dron ik AS id) lowers the amount of calcium loss from bone. It is used to treat too much calcium in your blood from cancer. It is also used to prevent complications of cancer that has spread to the bone. This medicine may be used for other purposes; ask your health care provider or pharmacist if you have questions. COMMON BRAND NAME(S): Zometa What should I tell my health care provider before I take this medicine? They need to know if you have any of these conditions: -aspirin-sensitive asthma -cancer, especially if you are receiving medicines used to treat cancer -dental disease or wear dentures -infection -kidney disease -receiving corticosteroids like dexamethasone or prednisone -an unusual or allergic reaction to zoledronic acid, other medicines, foods, dyes, or preservatives -pregnant or trying to get pregnant -breast-feeding How should I use this medicine? This medicine is for infusion into a vein. It is given by a health care professional in a hospital or clinic setting. Talk to your pediatrician regarding the use of this medicine in children. Special care may be needed. Overdosage: If you think you have  taken too much of this medicine contact a poison control center or emergency room at once. NOTE: This medicine is only for you. Do not share this medicine with others. What if I miss a dose? It is important not to miss your dose. Call your doctor or health care professional if you are unable to keep an appointment. What may interact with this medicine? -certain antibiotics given by injection -NSAIDs, medicines for  pain and inflammation, like ibuprofen or naproxen -some diuretics like bumetanide, furosemide -teriparatide -thalidomide This list may not describe all possible interactions. Give your health care provider a list of all the medicines, herbs, non-prescription drugs, or dietary supplements you use. Also tell them if you smoke, drink alcohol, or use illegal drugs. Some items may interact with your medicine. What should I watch for while using this medicine? Visit your doctor or health care professional for regular checkups. It may be some time before you see the benefit from this medicine. Do not stop taking your medicine unless your doctor tells you to. Your doctor may order blood tests or other tests to see how you are doing. Women should inform their doctor if they wish to become pregnant or think they might be pregnant. There is a potential for serious side effects to an unborn child. Talk to your health care professional or pharmacist for more information. You should make sure that you get enough calcium and vitamin D while you are taking this medicine. Discuss the foods you eat and the vitamins you take with your health care professional. Some people who take this medicine have severe bone, joint, and/or muscle pain. This medicine may also increase your risk for jaw problems or a broken thigh bone. Tell your doctor right away if you have severe pain in your jaw, bones, joints, or muscles. Tell your doctor if you have any pain that does not go away or that gets worse. Tell your dentist and dental surgeon that you are taking this medicine. You should not have major dental surgery while on this medicine. See your dentist to have a dental exam and fix any dental problems before starting this medicine. Take good care of your teeth while on this medicine. Make sure you see your dentist for regular follow-up appointments. What side effects may I notice from receiving this medicine? Side effects that you  should report to your doctor or health care professional as soon as possible: -allergic reactions like skin rash, itching or hives, swelling of the face, lips, or tongue -anxiety, confusion, or depression -breathing problems -changes in vision -eye pain -feeling faint or lightheaded, falls -jaw pain, especially after dental work -mouth sores -muscle cramps, stiffness, or weakness -redness, blistering, peeling or loosening of the skin, including inside the mouth -trouble passing urine or change in the amount of urine Side effects that usually do not require medical attention (report to your doctor or health care professional if they continue or are bothersome): -bone, joint, or muscle pain -constipation -diarrhea -fever -hair loss -irritation at site where injected -loss of appetite -nausea, vomiting -stomach upset -trouble sleeping -trouble swallowing -weak or tired This list may not describe all possible side effects. Call your doctor for medical advice about side effects. You may report side effects to FDA at 1-800-FDA-1088. Where should I keep my medicine? This drug is given in a hospital or clinic and will not be stored at home. NOTE: This sheet is a summary. It may not cover all possible information. If you have questions  about this medicine, talk to your doctor, pharmacist, or health care provider.  2018 Elsevier/Gold Standard (2013-11-26 14:19:39)

## 2017-01-07 ENCOUNTER — Ambulatory Visit: Payer: 59 | Admitting: Medical

## 2017-01-07 ENCOUNTER — Encounter: Payer: Self-pay | Admitting: Gastroenterology

## 2017-01-07 DIAGNOSIS — K5730 Diverticulosis of large intestine without perforation or abscess without bleeding: Secondary | ICD-10-CM | POA: Diagnosis not present

## 2017-01-07 DIAGNOSIS — Z1211 Encounter for screening for malignant neoplasm of colon: Secondary | ICD-10-CM | POA: Diagnosis not present

## 2017-01-07 DIAGNOSIS — Z800 Family history of malignant neoplasm of digestive organs: Secondary | ICD-10-CM | POA: Diagnosis not present

## 2017-01-07 HISTORY — PX: COLONOSCOPY: SHX174

## 2017-01-07 LAB — MULTIPLE MYELOMA PANEL, SERUM
ALBUMIN/GLOB SERPL: 2 — AB (ref 0.7–1.7)
Albumin SerPl Elph-Mcnc: 4.1 g/dL (ref 2.9–4.4)
Alpha 1: 0.3 g/dL (ref 0.0–0.4)
Alpha2 Glob SerPl Elph-Mcnc: 0.6 g/dL (ref 0.4–1.0)
B-Globulin SerPl Elph-Mcnc: 0.8 g/dL (ref 0.7–1.3)
GAMMA GLOB SERPL ELPH-MCNC: 0.5 g/dL (ref 0.4–1.8)
GLOBULIN, TOTAL: 2.1 g/dL — AB (ref 2.2–3.9)
IGA/IMMUNOGLOBULIN A, SERUM: 56 mg/dL — AB (ref 90–386)
IgG, Qn, Serum: 544 mg/dL — ABNORMAL LOW (ref 700–1600)
IgM, Qn, Serum: 43 mg/dL (ref 20–172)
M Protein SerPl Elph-Mcnc: 0.2 g/dL — ABNORMAL HIGH
Total Protein: 6.2 g/dL (ref 6.0–8.5)

## 2017-01-07 LAB — KAPPA/LAMBDA LIGHT CHAINS
IG KAPPA FREE LIGHT CHAIN: 15.8 mg/L (ref 3.3–19.4)
IG LAMBDA FREE LIGHT CHAIN: 8.3 mg/L (ref 5.7–26.3)
KAPPA/LAMBDA FLC RATIO: 1.9 — AB (ref 0.26–1.65)

## 2017-01-08 ENCOUNTER — Ambulatory Visit: Payer: 59 | Admitting: Physical Therapy

## 2017-01-08 DIAGNOSIS — R252 Cramp and spasm: Secondary | ICD-10-CM

## 2017-01-08 DIAGNOSIS — M6281 Muscle weakness (generalized): Secondary | ICD-10-CM

## 2017-01-08 DIAGNOSIS — R293 Abnormal posture: Secondary | ICD-10-CM

## 2017-01-08 DIAGNOSIS — M546 Pain in thoracic spine: Secondary | ICD-10-CM

## 2017-01-08 NOTE — Therapy (Signed)
[facility] [address]  [place], [place], [address] Phone: [phone]   Fax:  [phone]  Physical Therapy Treatment  Patient Details  Name: Jorge [name] MRN: [id number] Date of Birth: [birth year] Referring Provider: Durenda Age, MD  Encounter Date: 01/08/2017      PT End of Session - 01/08/17 1403    Visit Number 5   Number of Visits 16   Date for PT Re-Evaluation 02/20/17   Authorization Type UHC   PT Start Time 1403   PT Stop Time 1445   PT Time Calculation (min) 42 min   Activity Tolerance Patient tolerated treatment well   Behavior During Therapy [facility] for tasks assessed/performed      Past Medical History:  Diagnosis Date  . Anxiety   . Bone metastasis ([place])   . Chest cold 05/19/2016   productive cough  -- started on antibiotic  . Chronic back pain    due to bone mets from myeloma  . Cough   . Depression   . GERD (gastroesophageal reflux disease)   . Hiatal hernia   . History of chicken pox   . History of concussion    age 37 -- no residual  . History of DVT of lower extremity 03/21/2016  treated and completed w/ xarelto   per doppler left extensive occlusion common femoral, femoral, and popliteal veins and right partial occlusion common femoral and profunda femoral veins/  last doppler 06-04-2016 no evidence acute or chronic dvt noted either leg   . History of radiation therapy 06/09/16-06/23/16   lower thoracic spine 25 Gy in 10 fractions  . Mouth ulcers    secondary to radiation  . Multiple myeloma ([place]) dx 02/22/2016 via bone marrow bx---  oncologist-  [name]   IgG Kappa-- Hyperdiploid/ +11 w/ bone mets--  current treatment chemotherapy (started 08/ 2017)and pallitive radiation to back started 06-09-2016  . Renal calculus, right   . Wears contact lenses     Past Surgical History:  Procedure Laterality Date  . COLONOSCOPY  [id number]  . CYSTOSCOPY W/ URETERAL STENT  PLACEMENT Right 06/20/2016   Procedure: CYSTOSCOPY WITH STENT REPLACEMENT;  Surgeon: Kathie Rhodes, MD;  Location: [facility];  Service: Urology;  Laterality: Right;  . CYSTOSCOPY WITH RETROGRADE PYELOGRAM, URETEROSCOPY AND STENT PLACEMENT Right 05/30/2016   Procedure: CYSTOSCOPY WITH RETROGRADE PYELOGRAM, URETEROSCOPY AND STENT PLACEMENT,DILITATION URETERAL STRICTURE;  Surgeon: Kathie Rhodes, MD;  Location: [facility];  Service: Urology;  Laterality: Right;  . CYSTOSCOPY/RETROGRADE/URETEROSCOPY/STONE EXTRACTION WITH BASKET Right 06/20/2016   Procedure: CYSTOSCOPY/URETEROSCOPY/STONE EXTRACTION WITH BASKET;  Surgeon: Kathie Rhodes, MD;  Location: [facility];  Service: Urology;  Laterality: Right;  . HOLMIUM LASER APPLICATION Right 44/0/3474   Procedure: HOLMIUM LASER APPLICATION;  Surgeon: Kathie Rhodes, MD;  Location: [facility];  Service: Urology;  Laterality: Right;  . IR GENERIC HISTORICAL  02/11/2016   IR RADIOLOGIST EVAL & MGMT 02/11/2016 [facility]  . IR GENERIC HISTORICAL  02/15/2016   IR BONE TUMOR(S)RF ABLATION 02/15/2016 Luanne Bras, MD [facility]  . IR GENERIC HISTORICAL  02/15/2016   IR BONE TUMOR(S)RF ABLATION 02/15/2016 Luanne Bras, MD [facility]  . IR GENERIC HISTORICAL  02/15/2016   IR BONE TUMOR(S)RF ABLATION 02/15/2016 Luanne Bras, MD [facility]  . IR GENERIC HISTORICAL  02/15/2016   IR KYPHO THORACIC WITH BONE BIOPSY 02/15/2016 Luanne Bras, MD [facility]  . IR GENERIC HISTORICAL  02/15/2016   IR KYPHO  THORACIC WITH BONE BIOPSY 02/15/2016 Luanne Bras, MD [facility]  . IR GENERIC HISTORICAL  02/15/2016   IR VERTEBROPLASTY CERV/THOR BX INC UNI/BIL INC/INJECT/IMAGING 02/15/2016 Luanne Bras, MD [facility]  . IR GENERIC HISTORICAL  03/13/2016   IR KYPHO EA ADDL LEVEL THORACIC OR LUMBAR 03/13/2016 Luanne Bras, MD [facility]  . IR GENERIC HISTORICAL  03/13/2016   IR KYPHO EA ADDL LEVEL THORACIC OR LUMBAR  03/13/2016 Luanne Bras, MD [facility]  . IR GENERIC HISTORICAL  03/13/2016   IR BONE TUMOR(S)RF ABLATION 03/13/2016 Luanne Bras, MD [facility]  . IR GENERIC HISTORICAL  03/13/2016   IR KYPHO LUMBAR INC FX REDUCE BONE BX UNI/BIL CANNULATION INC/IMAGING 03/13/2016 Luanne Bras, MD [facility]  . IR GENERIC HISTORICAL  03/13/2016   IR BONE TUMOR(S)RF ABLATION 03/13/2016 Luanne Bras, MD [facility]  . IR GENERIC HISTORICAL  03/13/2016   IR BONE TUMOR(S)RF ABLATION 03/13/2016 Luanne Bras, MD [facility]  . IR GENERIC HISTORICAL  03/31/2016   IR RADIOLOGIST EVAL & MGMT 03/31/2016 [facility]  . LAPAROSCOPIC INGUINAL HERNIA REPAIR Bilateral 12-16-2013  dr gross  . RADIOLOGY WITH ANESTHESIA N/A 02/15/2016   Procedure: Spinal Ablation;  Surgeon: Luanne Bras, MD;  Location: [facility];  Service: Radiology;  Laterality: N/A;  . RADIOLOGY WITH ANESTHESIA N/A 03/13/2016   Procedure: LUMBER ABLATION;  Surgeon: Luanne Bras, MD;  Location: [facility];  Service: Radiology;  Laterality: N/A;  . ROTATOR CUFF REPAIR Right 2003  . TONSILLECTOMY  age 61  . WISDOM TOOTH EXTRACTION      There were no vitals filed for this visit.      Subjective Assessment - 01/08/17 1409    Subjective Pt reporting more uncomfortable and stiff than in pain today. Planning to try a 10K walk this weekend.   Pertinent History T6 & T8 kypholoplasty on 11/21/16. Multiple myeloma with multiple vertebral compression fractures with kyphoplasties and vertbroloplasties. Stem cell tranplant on 08/22/16 at [facility]. He follows up regularly with Dr. Marin Olp. He is referred for OP PT for "eval & treat, therapeutic exercises: strengthing/conditioning, therapeutic activities: conditioning"   Limitations Sitting;Standing;Walking   Patient Stated Goals "gain more strength in legs and back"   Currently in Pain? Yes   Pain Score 1    Pain Location Back   Pain Orientation Mid;Medial;Right   Pain Descriptors / Indicators  Discomfort                         [facility] Adult PT Treatment/Exercise - 01/08/17 1403      Lumbar Exercises: Aerobic   Stationary Bike lvl 3 x 6'     Lumbar Exercises: Machines for Strengthening   Cybex Knee Extension 20# x15 B con/ecc   Cybex Knee Flexion 25# x15 B con/ecc   Other Lumbar Machine Exercise BATCA low row 25# x20, unilateral row 15# x15 each     Lumbar Exercises: Standing   Functional Squats 15 reps;3 seconds   Functional Squats Limitations TRX   Side Lunge 10 reps;3 seconds   Side Lunge Limitations TRX     Lumbar Exercises: Supine   Clam 15 reps;3 seconds   Clam Limitations alt hip ABD/ER with blue TB   Dead Bug 10 reps;2 seconds  2 sets   Dead Bug Limitations 1st set from hooklying; 2nd set from table top position   Bridge 15 reps;3 seconds   Bridge Limitations + hip ABD isometric with blue TB     Shoulder Exercises: Prone  Extension Both;10 reps   Extension Limitations "I's" - leaning over green Pball resting on mat table   External Rotation Both;10 reps   External Rotation Limitations "W's" - leaning over green Pball resting on mat table   Horizontal ABduction 1 Both;10 reps   Horizontal ABduction 1 Limitations "T's" - leaning over green Pball resting on mat table   Horizontal ABduction 2 Both;10 reps   Horizontal ABduction 2 Limitations "Y's" - leaning over green Pball resting on mat table                  PT Short Term Goals - 01/05/17 1639      PT SHORT TERM GOAL #1   Title independent with initial HEP by 01/09/17   Status Achieved           PT Long Term Goals - 12/29/16 1711      PT LONG TERM GOAL #1   Title Independent with ongoing HEP +/- gym program to increase back extension & LE strength and decrease pain by 02/20/17   Status On-going     PT LONG TERM GOAL #2   Title B LE strength >/= 4+/5 for improved ease of mobility by 02/20/17   Status On-going     PT LONG TERM GOAL #3   Title Pt will tolerate  standing in the kitchen for >/= 45 minutes w/o limitation due to back pain to allow for meal prep by 02/20/17   Status On-going     PT LONG TERM GOAL #4   Title Pt will be able to perform a deep squat with good body mechanics to allow safe retrieval of objects from floor by 02/20/17   Status On-going               Plan - 01/08/17 1410    Clinical Impression Statement Pt progressing well with PT tolerating increased activity and resistance with exercises w/o increased in pain. Pt reporting more stiffness/discomfort than pain of late even with increasing activity.   Rehab Potential Good   Clinical Impairments Affecting Rehab Potential T6 & T8 kypholoplasty on 11/21/16. Multiple myeloma with multiple vertebral compression fractures with kyphoplasties and vertbroloplasties. Stem cell transplant on 08/22/16 at [facility]. Regular ongoing f/u with oncologist, Dr. Marin Olp. Due to extensive vertebral involvement and compression fractures, will avoid all flexion based activity and lifting >10 pounds.    PT Treatment/Interventions Patient/family education;ADLs/Self Care Home Management;Energy conservation;Neuromuscular re-education;Therapeutic exercise;Therapeutic activities;Functional mobility training;Gait training;Balance training;Moist Heat;Cryotherapy;Manual techniques;Taping;Dry needling   PT Next Visit Plan LE flexibility including manual therapy and possible dry needling to address muscular tightness; core/lumbar stabilization/strengthening exercises; postural training; LE strengthening   Consulted and Agree with Plan of Care Patient      Patient will benefit from skilled therapeutic intervention in order to improve the following deficits and impairments:  Pain, Impaired flexibility, Increased muscle spasms, Decreased range of motion, Decreased strength, Decreased endurance, Decreased activity tolerance, Decreased mobility, Postural dysfunction, Improper body mechanics, Impaired perceived functional  ability, Decreased knowledge of precautions  Visit Diagnosis: Abnormal posture  Muscle weakness (generalized)  Pain in thoracic spine  Cramp and spasm     Problem List Patient Active Problem List   Diagnosis Date Noted  . Phlebitis or thrombophlebitis of lower extremity 07/21/2016  . Visit for preventive health examination 05/16/2016  . Aphthous ulcer 05/16/2016  . Ureterolithiasis 05/16/2016  . Acute deep vein thrombosis (DVT) of femoral vein of left lower extremity ([place]) 03/21/2016  . Multiple myeloma ([place]) 02/22/2016  .  Multiple myeloma not having achieved remission ([place]) 02/22/2016  . LBP (low back pain) 11/02/2013  . Bilateral inguinal hernia (BIH), s/p lap repair 12/16/2013 11/02/2013    Percival Spanish, PT, MPT 01/08/2017, 2:51 PM  [facility] [address]  [place], [place], [address] Phone: [phone]   Fax:  [phone]  Name: Jorge [name] MRN: [id number] Date of Birth: Aug 10, 1955

## 2017-01-09 ENCOUNTER — Ambulatory Visit (INDEPENDENT_AMBULATORY_CARE_PROVIDER_SITE_OTHER): Payer: 59 | Admitting: Medical

## 2017-01-09 ENCOUNTER — Other Ambulatory Visit: Payer: 59

## 2017-01-09 DIAGNOSIS — G4701 Insomnia due to medical condition: Secondary | ICD-10-CM

## 2017-01-09 DIAGNOSIS — G8929 Other chronic pain: Secondary | ICD-10-CM

## 2017-01-09 NOTE — Patient Instructions (Addendum)
For your chronic insomnia will have you continue the ambien. I will keep you on this as needed. Will Refill this in the fall when you run out.  Continue to see Dr. Marin Olp for MM. If you have acute illness and need to be seen please schedule.  Follow up in 6 months or as needed

## 2017-01-09 NOTE — Progress Notes (Signed)
Subjective:    Patient ID: Jorge [name], male    DOB: [birth year], 61 y.o.   MRN: [id number]  HPI  Pt in for follow up.  Pt sees Dr. Marin Olp for Cancer management. He has multiple myeloma. He sees Dr. Marin Olp regularly. He gets pain meds from Dr. Marin Olp.  Pt needs refill of ambien. He has been on [place] for years. He used to travel a lot. It helps him sleep particularly since he has pain from bones related to the above. He states his pain is controlled. 2/10 level pain know on meds. Recently he had kyphoplasty.  Pt routinely followed by Dr. Marin Olp and labs are done. Cbc done recently and cmp done as well.        Review of Systems  Constitutional: Negative for chills, fatigue and fever.  HENT: Negative for congestion, ear discharge, ear pain, mouth sores and rhinorrhea.   Respiratory: Negative for cough, chest tightness, shortness of breath and wheezing.   Cardiovascular: Negative for chest pain and palpitations.  Gastrointestinal: Negative for abdominal pain.  Genitourinary: Negative for difficulty urinating, enuresis, flank pain, frequency, genital sores and hematuria.  Musculoskeletal: Negative for back pain and gait problem.  Skin: Negative for rash.  Neurological: Negative for dizziness, seizures, speech difficulty, weakness, numbness and headaches.  Hematological: Negative for adenopathy. Does not bruise/bleed easily.  Psychiatric/Behavioral: Positive for sleep disturbance. Negative for behavioral problems and confusion. The patient is not nervous/anxious.     Past Medical History:  Diagnosis Date  . Anxiety   . Bone metastasis ([place])   . Chest cold 05/19/2016   productive cough  -- started on antibiotic  . Chronic back pain    due to bone mets from myeloma  . Cough   . Depression   . GERD (gastroesophageal reflux disease)   . Hiatal hernia   . History of chicken pox   . History of concussion    age 75 -- no residual  . History of DVT of lower extremity  03/21/2016  treated and completed w/ xarelto   per doppler left extensive occlusion common femoral, femoral, and popliteal veins and right partial occlusion common femoral and profunda femoral veins/  last doppler 06-04-2016 no evidence acute or chronic dvt noted either leg   . History of radiation therapy 06/09/16-06/23/16   lower thoracic spine 25 Gy in 10 fractions  . Mouth ulcers    secondary to radiation  . Multiple myeloma ([place]) dx 02/22/2016 via bone marrow bx---  oncologist-  [name]   IgG Kappa-- Hyperdiploid/ +11 w/ bone mets--  current treatment chemotherapy (started 08/ 2017)and pallitive radiation to back started 06-09-2016  . Renal calculus, right   . Wears contact lenses      Social History   Social History  . Marital status: Married    Spouse name: N/A  . Number of children: 3  . Years of education: N/A   Occupational History  . Not on file.   Social History Main Topics  . Smoking status: Former Smoker    Packs/day: 1.00    Years: 7.00    Types: Cigarettes    Quit date: 11/02/1980  . Smokeless tobacco: Never Used  . Alcohol use Yes     Comment: occasional  . Drug use: No  . Sexual activity: No   Other Topics Concern  . Not on file   Social History Narrative  . No narrative on file    Past Surgical History:  Procedure Laterality Date  .  COLONOSCOPY  [id number]  . CYSTOSCOPY W/ URETERAL STENT PLACEMENT Right 06/20/2016   Procedure: CYSTOSCOPY WITH STENT REPLACEMENT;  Surgeon: Kathie Rhodes, MD;  Location: [facility];  Service: Urology;  Laterality: Right;  . CYSTOSCOPY WITH RETROGRADE PYELOGRAM, URETEROSCOPY AND STENT PLACEMENT Right 05/30/2016   Procedure: CYSTOSCOPY WITH RETROGRADE PYELOGRAM, URETEROSCOPY AND STENT PLACEMENT,DILITATION URETERAL STRICTURE;  Surgeon: Kathie Rhodes, MD;  Location: [facility];  Service: Urology;  Laterality: Right;  . CYSTOSCOPY/RETROGRADE/URETEROSCOPY/STONE EXTRACTION WITH BASKET Right 06/20/2016   Procedure:  CYSTOSCOPY/URETEROSCOPY/STONE EXTRACTION WITH BASKET;  Surgeon: Kathie Rhodes, MD;  Location: [facility];  Service: Urology;  Laterality: Right;  . HOLMIUM LASER APPLICATION Right 01/0/9323   Procedure: HOLMIUM LASER APPLICATION;  Surgeon: Kathie Rhodes, MD;  Location: [facility];  Service: Urology;  Laterality: Right;  . IR GENERIC HISTORICAL  02/11/2016   IR RADIOLOGIST EVAL & MGMT 02/11/2016 [facility]  . IR GENERIC HISTORICAL  02/15/2016   IR BONE TUMOR(S)RF ABLATION 02/15/2016 Luanne Bras, MD [facility]  . IR GENERIC HISTORICAL  02/15/2016   IR BONE TUMOR(S)RF ABLATION 02/15/2016 Luanne Bras, MD [facility]  . IR GENERIC HISTORICAL  02/15/2016   IR BONE TUMOR(S)RF ABLATION 02/15/2016 Luanne Bras, MD [facility]  . IR GENERIC HISTORICAL  02/15/2016   IR KYPHO THORACIC WITH BONE BIOPSY 02/15/2016 Luanne Bras, MD [facility]  . IR GENERIC HISTORICAL  02/15/2016   IR KYPHO THORACIC WITH BONE BIOPSY 02/15/2016 Luanne Bras, MD [facility]  . IR GENERIC HISTORICAL  02/15/2016   IR VERTEBROPLASTY CERV/THOR BX INC UNI/BIL INC/INJECT/IMAGING 02/15/2016 Luanne Bras, MD [facility]  . IR GENERIC HISTORICAL  03/13/2016   IR KYPHO EA ADDL LEVEL THORACIC OR LUMBAR 03/13/2016 Luanne Bras, MD [facility]  . IR GENERIC HISTORICAL  03/13/2016   IR KYPHO EA ADDL LEVEL THORACIC OR LUMBAR 03/13/2016 Luanne Bras, MD [facility]  . IR GENERIC HISTORICAL  03/13/2016   IR BONE TUMOR(S)RF ABLATION 03/13/2016 Luanne Bras, MD [facility]  . IR GENERIC HISTORICAL  03/13/2016   IR KYPHO LUMBAR INC FX REDUCE BONE BX UNI/BIL CANNULATION INC/IMAGING 03/13/2016 Luanne Bras, MD [facility]  . IR GENERIC HISTORICAL  03/13/2016   IR BONE TUMOR(S)RF ABLATION 03/13/2016 Luanne Bras, MD [facility]  . IR GENERIC HISTORICAL  03/13/2016   IR BONE TUMOR(S)RF ABLATION 03/13/2016 Luanne Bras, MD [facility]  . IR GENERIC HISTORICAL   03/31/2016   IR RADIOLOGIST EVAL & MGMT 03/31/2016 [facility]  . LAPAROSCOPIC INGUINAL HERNIA REPAIR Bilateral 12-16-2013  dr gross  . RADIOLOGY WITH ANESTHESIA N/A 02/15/2016   Procedure: Spinal Ablation;  Surgeon: Luanne Bras, MD;  Location: [place];  Service: Radiology;  Laterality: N/A;  . RADIOLOGY WITH ANESTHESIA N/A 03/13/2016   Procedure: LUMBER ABLATION;  Surgeon: Luanne Bras, MD;  Location: [place];  Service: Radiology;  Laterality: N/A;  . ROTATOR CUFF REPAIR Right 2003  . TONSILLECTOMY  age 67  . WISDOM TOOTH EXTRACTION      Family History  Problem Relation Age of Onset  . Uterine cancer Mother   . Heart disease Father   . Hypertension Father   . Multiple sclerosis Sister   . Paranoid behavior Brother   . Drug abuse Brother   . Schizophrenia Brother   . Stroke Maternal Grandfather   . Cancer Maternal Aunt   . Leukemia Paternal Aunt   . Healthy Son        x1  . Healthy Daughter  x2  . Allergies Daughter        x1  . Diabetes Neg Hx   . Alzheimer's disease Neg Hx   . Parkinson's disease Neg Hx     No Known Allergies  Current Outpatient Prescriptions on File Prior to Visit  Medication Sig Dispense Refill  . calcium carbonate (TUMS - DOSED IN MG ELEMENTAL CALCIUM) 500 MG chewable tablet Chew 1 tablet by mouth as needed for indigestion or heartburn.    . carisoprodol (SOMA) 350 MG tablet Take 1 tablet (350 mg total) by mouth 4 (four) times daily as needed for muscle spasms. 90 tablet 3  . Cholecalciferol (VITAMIN D-3) 5000 units TABS Take 5,000 mg by mouth daily.    . cyclobenzaprine (FLEXERIL) 10 MG tablet Take 10 mg by mouth daily as needed for muscle spasms.   0  . famciclovir (FAMVIR) 500 MG tablet Take 1 tablet (500 mg total) by mouth daily. 90 tablet 3  . fentaNYL (DURAGESIC - DOSED MCG/HR) 25 MCG/HR patch Place 1 patch (25 mcg total) onto the skin every 3 (three) days. 10 patch 0  . HYDROcodone-acetaminophen (NORCO) 10-325 MG tablet Take 0.5-1  tablets by mouth every 8 (eight) hours as needed for moderate pain. 60 tablet 0  . lenalidomide (REVLIMID) 10 MG capsule TAKE 1 CAPSULE BY MOUTH EVERY DAY FOR 21 DAYS, THEN 7 DAYS OFF. [id number] 21 capsule 6  . lidocaine (LIDODERM) 5 % PLAEC 1 PATCH ONTO THE SKIN DAILY.APPLY 1 PATCH TO THE MOST PAINFUL AREA FOR 12 HR IN A 24 HR PERIOD  2  . magnesium oxide (MAG-OX) 400 MG tablet Take 400 mg by mouth daily.    . ondansetron (ZOFRAN-ODT) 8 MG disintegrating tablet Take 1 tablet (8 mg total) by mouth every 8 (eight) hours as needed. 60 tablet 1  . Oxycodone HCl 10 MG TABS Take 1 tablet (10 mg total) by mouth every 4 (four) hours as needed. (Patient taking differently: Take 10 mg by mouth every 4 (four) hours as needed (pain). ) 30 tablet 0  . polyethylene glycol powder (MIRALAX) powder Take 17 g by mouth daily. 850 g 3  . Probiotic Product (PROBIOTIC PO) Take 1 capsule by mouth daily.    . ranitidine (ZANTAC) 150 MG tablet Take 150 mg by mouth as needed for heartburn.    . rivaroxaban (XARELTO) 10 MG TABS tablet Take 1 tablet (10 mg total) by mouth daily with supper. 30 tablet 12  . senna (SENOKOT) 8.6 MG TABS tablet Take 2 tablets (17.2 mg total) by mouth daily. (Patient taking differently: Take 1 tablet by mouth daily. ) 60 each 2  . sertraline (ZOLOFT) 100 MG tablet Take 100 mg by mouth at bedtime.   2  . zolpidem (AMBIEN) 10 MG tablet Take 1 tablet (10 mg total) by mouth at bedtime as needed. (Patient taking differently: Take 10 mg by mouth at bedtime as needed for sleep. ) 30 tablet 3   No current facility-administered medications on file prior to visit.     BP 109/67 (BP Location: Left Arm, Patient Position: Sitting, Cuff Size: Normal)   Pulse (!) 57   Temp 97.7 F (36.5 C) (Oral)   Resp 16   Ht '5\' 10"'  (1.778 m)   Wt 162 lb (73.5 kg)   SpO2 100%   BMI 23.24 kg/m       Objective:   Physical Exam  General Mental Status- Alert. General Appearance- Not in acute distress.    Skin General: Color-  Normal Color. Moisture- Normal Moisture.  Neck Carotid Arteries- Normal color. Moisture- Normal Moisture. No carotid bruits. No JVD.  Chest and Lung Exam Auscultation: Breath Sounds:-Normal.  Cardiovascular Auscultation:Rythm- Regular. Murmurs & Other Heart Sounds:Auscultation of the heart reveals- No Murmurs.  Abdomen Inspection:-Inspeection Normal. Palpation/Percussion:Note:No mass. Palpation and Percussion of the abdomen reveal- Non Tender, Non Distended + BS, no rebound or guarding.  Neurologic Cranial Nerve exam:- CN III-XII intact(No nystagmus), symmetric smile. Strength:- 5/5 equal and symmetric strength both upper and lower extremities.      Assessment & Plan:  For your chronic insomnia will have you continue the ambien. I will keep you on this as needed. Will Refill this in the fall when you run out.  Continue to see Dr. Marin Olp for MM. If you have acute illness and need to be seen please schedule.  Follow up in 6 months or as needed  Abrielle Finck, Percell Miller, [employer]

## 2017-01-12 ENCOUNTER — Ambulatory Visit: Payer: 59

## 2017-01-16 ENCOUNTER — Other Ambulatory Visit: Payer: 59

## 2017-01-17 ENCOUNTER — Other Ambulatory Visit: Payer: Self-pay | Admitting: Family

## 2017-01-17 DIAGNOSIS — M7989 Other specified soft tissue disorders: Secondary | ICD-10-CM

## 2017-01-17 DIAGNOSIS — C9000 Multiple myeloma not having achieved remission: Secondary | ICD-10-CM

## 2017-01-17 DIAGNOSIS — I8290 Acute embolism and thrombosis of unspecified vein: Secondary | ICD-10-CM

## 2017-01-20 ENCOUNTER — Telehealth: Payer: Self-pay | Admitting: Physical Therapy

## 2017-01-20 ENCOUNTER — Ambulatory Visit: Payer: 59 | Attending: Pain Medicine | Admitting: Physical Therapy

## 2017-01-20 DIAGNOSIS — R252 Cramp and spasm: Secondary | ICD-10-CM | POA: Diagnosis present

## 2017-01-20 DIAGNOSIS — M6281 Muscle weakness (generalized): Secondary | ICD-10-CM | POA: Insufficient documentation

## 2017-01-20 DIAGNOSIS — R293 Abnormal posture: Secondary | ICD-10-CM | POA: Diagnosis not present

## 2017-01-20 DIAGNOSIS — M546 Pain in thoracic spine: Secondary | ICD-10-CM | POA: Insufficient documentation

## 2017-01-20 NOTE — Therapy (Signed)
[facility] [address]  [place], [place], [address] Phone: [phone]   Fax:  [phone]  Physical Therapy Treatment  Patient Details  Name: Jorge [name] MRN: [id number] Date of Birth: 01-01-1956 Referring Provider: Durenda Age, MD  Encounter Date: 01/20/2017      PT End of Session - 01/20/17 1532    Visit Number 6   Number of Visits 16   Date for PT Re-Evaluation 02/20/17   Authorization Type U[employer]   PT Start Time 1532   PT Stop Time 1624   PT Time Calculation (min) 52 min   Activity Tolerance Patient tolerated treatment well   Behavior During Therapy [facility] for tasks assessed/performed      Past Medical History:  Diagnosis Date  . Anxiety   . Bone metastasis ([place])   . Chest cold 05/19/2016   productive cough  -- started on antibiotic  . Chronic back pain    due to bone mets from myeloma  . Cough   . Depression   . GERD (gastroesophageal reflux disease)   . Hiatal hernia   . History of chicken pox   . History of concussion    age 61 -- no residual  . History of DVT of lower extremity 03/21/2016  treated and completed w/ xarelto   per doppler left extensive occlusion common femoral, femoral, and popliteal veins and right partial occlusion common femoral and profunda femoral veins/  last doppler 06-04-2016 no evidence acute or chronic dvt noted either leg   . History of radiation therapy 06/09/16-06/23/16   lower thoracic spine 25 Gy in 10 fractions  . Mouth ulcers    secondary to radiation  . Multiple myeloma ([place]) dx 02/22/2016 via bone marrow bx---  oncologist-  [name]   IgG Kappa-- Hyperdiploid/ +11 w/ bone mets--  current treatment chemotherapy (started 08/ 2017)and pallitive radiation to back started 06-09-2016  . Renal calculus, right   . Wears contact lenses     Past Surgical History:  Procedure Laterality Date  . COLONOSCOPY  [id number]  . CYSTOSCOPY W/ URETERAL STENT  PLACEMENT Right 06/20/2016   Procedure: CYSTOSCOPY WITH STENT REPLACEMENT;  Surgeon: Kathie Rhodes, MD;  Location[facility];  Service: Urology;  Laterality: Right;  . CYSTOSCOPY WITH RETROGRADE PYELOGRAM, URETEROSCOPY AND STENT PLACEMENT Right 05/30/2016   Procedure: CYSTOSCOPY WITH RETROGRADE PYELOGRAM, URETEROSCOPY AND STENT PLACEMENT,DILITATION URETERAL STRICTURE;  Surgeon: Kathie Rhodes, MD;  [facility];  Service: Urology;  Laterality: Right;  . CYSTOSCOPY/RETROGRADE/URETEROSCOPY/STONE EXTRACTION WITH BASKET Right 06/20/2016   Procedure: CYSTOSCOPY/URETEROSCOPY/STONE EXTRACTION WITH BASKET;  Surgeon: Kathie Rhodes, MD;  Location: [facility];  Service: Urology;  Laterality: Right;  . HOLMIUM LASER APPLICATION Right 09/19/8826   Procedure: HOLMIUM LASER APPLICATION;  Surgeon: Kathie Rhodes, MD;  Location: [facility];  Service: Urology;  Laterality: Right;  . IR GENERIC HISTORICAL  02/11/2016   IR RADIOLOGIST EVAL & MGMT 02/11/2016 [facility]  . IR GENERIC HISTORICAL  02/15/2016   IR BONE TUMOR(S)RF ABLATION 02/15/2016 Luanne Bras, MD [facility]  . IR GENERIC HISTORICAL  02/15/2016   IR BONE TUMOR(S)RF ABLATION 02/15/2016 Luanne Bras, MD [facility]  . IR GENERIC HISTORICAL  02/15/2016   IR BONE TUMOR(S)RF ABLATION 02/15/2016 Luanne Bras, MD [facility]  . IR GENERIC HISTORICAL  02/15/2016   IR KYPHO THORACIC WITH BONE BIOPSY 02/15/2016 Luanne Bras, MD [facility]  . IR GENERIC HISTORICAL  02/15/2016   IR KYPHO  THORACIC WITH BONE BIOPSY 02/15/2016 Luanne Bras, MD [facility]  . IR GENERIC HISTORICAL  02/15/2016   IR VERTEBROPLASTY CERV/THOR BX INC UNI/BIL INC/INJECT/IMAGING 02/15/2016 Luanne Bras, MD [facility]  . IR GENERIC HISTORICAL  03/13/2016   IR KYPHO EA ADDL LEVEL THORACIC OR LUMBAR 03/13/2016 Luanne Bras, MD [facility]  . IR GENERIC HISTORICAL  03/13/2016   IR KYPHO EA ADDL LEVEL THORACIC OR LUMBAR  03/13/2016 Luanne Bras, MD [facility]  . IR GENERIC HISTORICAL  03/13/2016   IR BONE TUMOR(S)RF ABLATION 03/13/2016 Luanne Bras, MD [facility]  . IR GENERIC HISTORICAL  03/13/2016   IR KYPHO LUMBAR INC FX REDUCE BONE BX UNI/BIL CANNULATION INC/IMAGING 03/13/2016 Luanne Bras, MD [facility]  . IR GENERIC HISTORICAL  03/13/2016   IR BONE TUMOR(S)RF ABLATION 03/13/2016 Luanne Bras, MD [facility]  . IR GENERIC HISTORICAL  03/13/2016   IR BONE TUMOR(S)RF ABLATION 03/13/2016 Luanne Bras, MD [facility]  . IR GENERIC HISTORICAL  03/31/2016   IR RADIOLOGIST EVAL & MGMT 03/31/2016 [facility]  . LAPAROSCOPIC INGUINAL HERNIA REPAIR Bilateral 12-16-2013  dr gross  . RADIOLOGY WITH ANESTHESIA N/A 02/15/2016   Procedure: Spinal Ablation;  Surgeon: Luanne Bras, MD;  Location: [place];  Service: Radiology;  Laterality: N/A;  . RADIOLOGY WITH ANESTHESIA N/A 03/13/2016   Procedure: LUMBER ABLATION;  Surgeon: Luanne Bras, MD;  Location: [place];  Service: Radiology;  Laterality: N/A;  . ROTATOR CUFF REPAIR Right 2003  . TONSILLECTOMY  age 61  . WISDOM TOOTH EXTRACTION      There were no vitals filed for this visit.      Subjective Assessment - 01/20/17 1534    Subjective Pt reports he visited with a friend at his [place] and had to go up/down 60 steps to access the boat dock at the [place], but did ok with this. Walking tolerance continues to improve. Back more sore today after spending a good part of the day filing things yesterday. Also thinks increased pain may be due to trying to sleep more in the bed and feels that the mattress is too soft.   Pertinent History T6 & T8 kypholoplasty on 11/21/16. Multiple myeloma with multiple vertebral compression fractures with kyphoplasties and vertbroloplasties. Stem [facility] tranplant on 08/22/16 at [facility]. He follows up regularly with Dr. Marin Olp. He is referred for OP PT for "eval & treat, therapeutic exercises:  strengthing/conditioning, therapeutic activities: conditioning"   Limitations Sitting;Standing;Walking   Patient Stated Goals "gain more strength in legs and back"   Currently in Pain? Yes   Pain Score 3    Pain Location Back   Pain Orientation Mid;Medial;Right   Pain Descriptors / Indicators Aching   Pain Type Chronic pain   Pain Radiating Towards occasional/rare pain wrapping around ribs on L side   Pain Onset More than a month ago   Pain Frequency Intermittent                         [facility] Adult PT Treatment/Exercise - 01/20/17 1532      Self-Care   Self-Care Other Self-Care Comments   Other Self-Care Comments  Instructed pt is self-STM to L lower thoracic paraspinals using small ball against wall. Cautioned pt to avoid rolling ball across vertebrae and to limit amount of body weight leaning into ball.     Lumbar Exercises: Aerobic   Stationary Bike lvl 3 x 6'     Lumbar Exercises: Standing   Heel Raises  15 reps;3 seconds   Functional Squats 15 reps;3 seconds   Functional Squats Limitations TRX   Forward Lunge 10 reps;3 seconds  Bil   Side Lunge 15 reps;3 seconds   Side Lunge Limitations TRX   Other Standing Lumbar Exercises B pallof press with green TB x15 each     Lumbar Exercises: Supine   Dead Bug 15 reps;2 seconds   Dead Bug Limitations LE from table top, UE at 90 dg flexion     Lumbar Exercises: Sidelying   Other Sidelying Lumbar Exercises B side plank elbow to knee 10x2"     Lumbar Exercises: Quadruped   Madcat/Old Horse 15 reps   Madcat/Old Horse Limitations 3" hold   Opposite Arm/Leg Raise Right arm/Left leg;Left arm/Right leg;10 reps;3 seconds   Other Quadruped Lumbar Exercises Prone rocking into cobra pose 15x5"     Manual Therapy   Manual Therapy Soft tissue mobilization;Myofascial release;Taping   Soft tissue mobilization L lower thoracic paraspinals   Myofascial Release TPR to L lower thoracic paraspinals   Kinesiotex Inhibit Muscle      Kinesiotix   Inhibit Muscle  2 parallel "I" strips 30% along paraspinals from mid lumbar to upper thoracic spine, peripendicular "I" strip 50% at area of greatest pain/muscle tension (lower thoracic spine at distal angle of scapula)                PT Education - 01/20/17 1624    Education provided Yes   Education Details Pt instucted in gentle self-STM using small ball on wall with precautions to avoid rolling over vertebrae as well as limit pressure/body weight.   Person(s) Educated Patient   Methods Explanation;Demonstration   Comprehension Verbalized understanding;Returned demonstration          PT Short Term Goals - 01/05/17 1639      PT SHORT TERM GOAL #1   Title independent with initial HEP by 01/09/17   Status Achieved           PT Long Term Goals - 12/29/16 1711      PT LONG TERM GOAL #1   Title Independent with ongoing HEP +/- gym program to increase back extension & LE strength and decrease pain by 02/20/17   Status On-going     PT LONG TERM GOAL #2   Title B LE strength >/= 4+/5 for improved ease of mobility by 02/20/17   Status On-going     PT LONG TERM GOAL #3   Title Pt will tolerate standing in the kitchen for >/= 45 minutes w/o limitation due to back pain to allow for meal prep by 02/20/17   Status On-going     PT LONG TERM GOAL #4   Title Pt will be able to perform a deep squat with good body mechanics to allow safe retrieval of objects from floor by 02/20/17   Status On-going               Plan - 01/20/17 1644    Clinical Impression Statement Pt noting increased pain in L lower thoracic paraspinals today with taut bands and TPs identified at site of pain. Introduced gentle STM and MFR/TPR to these muscles with some relief noted, but feel pt may benefit from DN +/- estim to promote further muscle relaxation. Given current medical history of multiple myeloma with bony demineralization and mutiple level compression fractures, call placed  and message left for referring MD for clearance to attempt DN +/- estim - awaiting response. Pt instucted in gentle  self-STM using small ball on wall with precautions to avoid rolling over vertebrae.   Rehab Potential Good   Clinical Impairments Affecting Rehab Potential T6 & T8 kypholoplasty on 11/21/16. Multiple myeloma with multiple vertebral compression fractures with kyphoplasties and vertbroloplasties. Stem cell transplant on 08/22/16 at [facility]. Regular ongoing f/u with oncologist, Dr. Marin Olp. Due to extensive vertebral involvement and compression fractures, will avoid all flexion based activity and lifting >10 pounds.    PT Treatment/Interventions Patient/family education;ADLs/Self Care Home Management;Energy conservation;Neuromuscular re-education;Therapeutic exercise;Therapeutic activities;Functional mobility training;Gait training;Balance training;Moist Heat;Cryotherapy;Manual techniques;Taping;Dry needling   PT Next Visit Plan LE flexibility including manual therapy and possible dry needling to address muscular tightness; core/lumbar stabilization/strengthening exercises; postural training; LE strengthening   Consulted and Agree with Plan of Care Patient      Patient will benefit from skilled therapeutic intervention in order to improve the following deficits and impairments:  Pain, Impaired flexibility, Increased muscle spasms, Decreased range of motion, Decreased strength, Decreased endurance, Decreased activity tolerance, Decreased mobility, Postural dysfunction, Improper body mechanics, Impaired perceived functional ability, Decreased knowledge of precautions  Visit Diagnosis: Abnormal posture  Muscle weakness (generalized)  Pain in thoracic spine  Cramp and spasm     Problem List Patient Active Problem List   Diagnosis Date Noted  . Phlebitis or thrombophlebitis of lower extremity 07/21/2016  . Visit for preventive health examination 05/16/2016  . Aphthous ulcer 05/16/2016  .  Ureterolithiasis 05/16/2016  . Acute deep vein thrombosis (DVT) of femoral vein of left lower extremity ([place]) 03/21/2016  . Multiple myeloma ([place]) 02/22/2016  . Multiple myeloma not having achieved remission ([place]) 02/22/2016  . LBP (low back pain) 11/02/2013  . Bilateral inguinal hernia (BIH), s/p lap repair 12/16/2013 11/02/2013    Percival Spanish, PT, MPT 01/20/2017, 4:57 PM  [facility] [address]  [place], [place], [address] Phone: [phone]   Fax:  [phone]  Name: Jorge [name] MRN: [id number] Date of Birth: 01/27/1956

## 2017-01-21 ENCOUNTER — Other Ambulatory Visit: Payer: Self-pay | Admitting: *Deleted

## 2017-01-21 DIAGNOSIS — I8290 Acute embolism and thrombosis of unspecified vein: Secondary | ICD-10-CM

## 2017-01-21 DIAGNOSIS — D472 Monoclonal gammopathy: Secondary | ICD-10-CM

## 2017-01-21 DIAGNOSIS — G8929 Other chronic pain: Secondary | ICD-10-CM

## 2017-01-21 DIAGNOSIS — M545 Low back pain, unspecified: Secondary | ICD-10-CM

## 2017-01-21 DIAGNOSIS — C9000 Multiple myeloma not having achieved remission: Secondary | ICD-10-CM

## 2017-01-21 DIAGNOSIS — M7989 Other specified soft tissue disorders: Secondary | ICD-10-CM

## 2017-01-21 MED ORDER — LENALIDOMIDE 10 MG PO CAPS: ORAL_CAPSULE | ORAL | 0 refills | Status: DC

## 2017-01-22 ENCOUNTER — Telehealth: Payer: Self-pay | Admitting: *Deleted

## 2017-01-22 ENCOUNTER — Ambulatory Visit: Payer: 59

## 2017-01-22 DIAGNOSIS — M546 Pain in thoracic spine: Secondary | ICD-10-CM

## 2017-01-22 DIAGNOSIS — R293 Abnormal posture: Secondary | ICD-10-CM | POA: Diagnosis not present

## 2017-01-22 DIAGNOSIS — R252 Cramp and spasm: Secondary | ICD-10-CM

## 2017-01-22 DIAGNOSIS — M6281 Muscle weakness (generalized): Secondary | ICD-10-CM

## 2017-01-22 MED ORDER — ROPINIROLE HCL 0.25 MG PO TABS: ORAL_TABLET | ORAL | 2 refills | Status: DC

## 2017-01-22 NOTE — Therapy (Signed)
[facility] [address]  [place], [place], [address] Phone: [phone]   Fax:  [phone]  Physical Therapy Treatment  Patient Details  Name: Jorge [name] MRN: [id number] Date of Birth: 1956-05-20 Referring Provider: Durenda Age, MD  Encounter Date: 01/22/2017      PT End of Session - 01/22/17 1538    Visit Number 7   Number of Visits 16   Date for PT Re-Evaluation 02/20/17   Authorization Type [employer]   PT Start Time 3734   PT Stop Time 1615   PT Time Calculation (min) 42 min   Activity Tolerance Patient tolerated treatment well   Behavior During Therapy [facility] for tasks assessed/performed      Past Medical History:  Diagnosis Date  . Anxiety   . Bone metastasis ([place])   . Chest cold 05/19/2016   productive cough  -- started on antibiotic  . Chronic back pain    due to bone mets from myeloma  . Cough   . Depression   . GERD (gastroesophageal reflux disease)   . Hiatal hernia   . History of chicken pox   . History of concussion    age 89 -- no residual  . History of DVT of lower extremity 03/21/2016  treated and completed w/ xarelto   per doppler left extensive occlusion common femoral, femoral, and popliteal veins and right partial occlusion common femoral and profunda femoral veins/  last doppler 06-04-2016 no evidence acute or chronic dvt noted either leg   . History of radiation therapy 06/09/16-06/23/16   lower thoracic spine 25 Gy in 10 fractions  . Mouth ulcers    secondary to radiation  . Multiple myeloma ([place]) dx 02/22/2016 via bone marrow bx---  oncologist-  [name]   IgG Kappa-- Hyperdiploid/ +11 w/ bone mets--  current treatment chemotherapy (started 08/ 2017)and pallitive radiation to back started 06-09-2016  . Renal calculus, right   . Wears contact lenses     Past Surgical History:  Procedure Laterality Date  . COLONOSCOPY  [id number]  . CYSTOSCOPY W/ URETERAL STENT  PLACEMENT Right 06/20/2016   Procedure: CYSTOSCOPY WITH STENT REPLACEMENT;  Surgeon: Kathie Rhodes, MD;  Location: [facility];  Service: Urology;  Laterality: Right;  . CYSTOSCOPY WITH RETROGRADE PYELOGRAM, URETEROSCOPY AND STENT PLACEMENT Right 05/30/2016   Procedure: CYSTOSCOPY WITH RETROGRADE PYELOGRAM, URETEROSCOPY AND STENT PLACEMENT,DILITATION URETERAL STRICTURE;  Surgeon: Kathie Rhodes, MD;  Location: [facility];  Service: Urology;  Laterality: Right;  . CYSTOSCOPY/RETROGRADE/URETEROSCOPY/STONE EXTRACTION WITH BASKET Right 06/20/2016   Procedure: CYSTOSCOPY/URETEROSCOPY/STONE EXTRACTION WITH BASKET;  Surgeon: Kathie Rhodes, MD;  Location: [facility];  Service: Urology;  Laterality: Right;  . HOLMIUM LASER APPLICATION Right 28/01/6810   Procedure: HOLMIUM LASER APPLICATION;  Surgeon: Kathie Rhodes, MD;  Location: [facility];  Service: Urology;  Laterality: Right;  . IR GENERIC HISTORICAL  02/11/2016   IR RADIOLOGIST EVAL & MGMT 02/11/2016 [facility]  . IR GENERIC HISTORICAL  02/15/2016   IR BONE TUMOR(S)RF ABLATION 02/15/2016 Luanne Bras, MD [facility]  . IR GENERIC HISTORICAL  02/15/2016   IR BONE TUMOR(S)RF ABLATION 02/15/2016 Luanne Bras, MD [facility]  . IR GENERIC HISTORICAL  02/15/2016   IR BONE TUMOR(S)RF ABLATION 02/15/2016 Luanne Bras, MD [facility]  . IR GENERIC HISTORICAL  02/15/2016   IR KYPHO THORACIC WITH BONE BIOPSY 02/15/2016 Luanne Bras, MD [facility]  . IR GENERIC HISTORICAL  02/15/2016   IR KYPHO  THORACIC WITH BONE BIOPSY 02/15/2016 Luanne Bras, MD [facility]  . IR GENERIC HISTORICAL  02/15/2016   IR VERTEBROPLASTY CERV/THOR BX INC UNI/BIL INC/INJECT/IMAGING 02/15/2016 Luanne Bras, MD [facility]  . IR GENERIC HISTORICAL  03/13/2016   IR KYPHO EA ADDL LEVEL THORACIC OR LUMBAR 03/13/2016 Luanne Bras, MD [facility]  . IR GENERIC HISTORICAL  03/13/2016   IR KYPHO EA ADDL LEVEL THORACIC OR LUMBAR  03/13/2016 Luanne Bras, MD [facility]  . IR GENERIC HISTORICAL  03/13/2016   IR BONE TUMOR(S)RF ABLATION 03/13/2016 Luanne Bras, MD [facility]  . IR GENERIC HISTORICAL  03/13/2016   IR KYPHO LUMBAR INC FX REDUCE BONE BX UNI/BIL CANNULATION INC/IMAGING 03/13/2016 Luanne Bras, MD [facility]  . IR GENERIC HISTORICAL  03/13/2016   IR BONE TUMOR(S)RF ABLATION 03/13/2016 Luanne Bras, MD [facility]  . IR GENERIC HISTORICAL  03/13/2016   IR BONE TUMOR(S)RF ABLATION 03/13/2016 Luanne Bras, MD [facility]  . IR GENERIC HISTORICAL  03/31/2016   IR RADIOLOGIST EVAL & MGMT 03/31/2016 [facility]  . LAPAROSCOPIC INGUINAL HERNIA REPAIR Bilateral 12-16-2013  dr gross  . RADIOLOGY WITH ANESTHESIA N/A 02/15/2016   Procedure: Spinal Ablation;  Surgeon: Luanne Bras, MD;  Location: [facility];  Service: Radiology;  Laterality: N/A;  . RADIOLOGY WITH ANESTHESIA N/A 03/13/2016   Procedure: LUMBER ABLATION;  Surgeon: Luanne Bras, MD;  Location: [place];  Service: Radiology;  Laterality: N/A;  . ROTATOR CUFF REPAIR Right 2003  . TONSILLECTOMY  age 42  . WISDOM TOOTH EXTRACTION      There were no vitals filed for this visit.      Subjective Assessment - 01/22/17 1535    Subjective Pt. noting taping came off after 1 day.  Pt. has not yet attempted selfr-ball massage with tennis ball.  Walking 2 miles every morning.     Patient Stated Goals "gain more strength in legs and back"   Currently in Pain? Yes   Pain Score 2    Pain Orientation Right;Medial   Pain Descriptors / Indicators Aching   Pain Type Chronic pain   Pain Onset More than a month ago   Pain Frequency Constant  "Not as constant anymore"   Aggravating Factors  leaning forward, prolonged standing    Multiple Pain Sites No                         [facility] Adult PT Treatment/Exercise - 01/22/17 1557      Lumbar Exercises: Aerobic   Stationary Bike NuStep: lvl 5, 6 min      Lumbar Exercises:  Standing   Other Standing Lumbar Exercises B pallof press with green TB x15 each   Other Standing Lumbar Exercises Standing leaning on wall with UE half circles in full extension with red med ball (1000Gr) 2 x 10 reps   cues to prevent excessive lordosis      Lumbar Exercises: Seated   Sit to Stand 10 reps  2 sets; from mat table with airex pad    Sit to Stand Limitations without UE pushoff   green looped TB around knees to prevent knees "caving in"     Lumbar Exercises: Supine   Dead Bug 15 reps;2 seconds  x 2 sets    Dead Bug Limitations LE from table top, UE at 90 dg flexion   Isometric Hip Flexion 10 reps;3 seconds     Lumbar Exercises: Sidelying   Clam 15 reps;3 seconds   Clam  Limitations Bil with green TB   Other Sidelying Lumbar Exercises B side plank elbow to knee 10x2"                  PT Short Term Goals - 01/05/17 1639      PT SHORT TERM GOAL #1   Title independent with initial HEP by 01/09/17   Status Achieved           PT Long Term Goals - 12/29/16 1711      PT LONG TERM GOAL #1   Title Independent with ongoing HEP +/- gym program to increase back extension & LE strength and decrease pain by 02/20/17   Status On-going     PT LONG TERM GOAL #2   Title B LE strength >/= 4+/5 for improved ease of mobility by 02/20/17   Status On-going     PT LONG TERM GOAL #3   Title Pt will tolerate standing in the kitchen for >/= 45 minutes w/o limitation due to back pain to allow for meal prep by 02/20/17   Status On-going     PT LONG TERM GOAL #4   Title Pt will be able to perform a deep squat with good body mechanics to allow safe retrieval of objects from floor by 02/20/17   Status On-going               Plan - 01/22/17 1539    Clinical Impression Statement Pt. doing well today.  PT with phone inquiry regarding possibility of DN to MD office on 7.10 however still with no answer back.  Treatment focusing on core/LE strengthening activities which pt.  tolerated well.  Pt. still walking 2 miles/day.  Pt. noting limited benefit from taping last treatment due to taping coming loose after 1 day.  Has not yet tried self-ball massage on wall for mid back tightness.  Seems to be progressing well with strengthening/stabilization therex at this point.      Clinical Impairments Affecting Rehab Potential T6 & T8 kypholoplasty on 11/21/16. Multiple myeloma with multiple vertebral compression fractures with kyphoplasties and vertbroloplasties. Stem cell transplant on 08/22/16 at [facility]. Regular ongoing f/u with oncologist, Dr. Marin Olp. Due to extensive vertebral involvement and compression fractures, will avoid all flexion based activity and lifting >10 pounds.    PT Treatment/Interventions Patient/family education;ADLs/Self Care Home Management;Energy conservation;Neuromuscular re-education;Therapeutic exercise;Therapeutic activities;Functional mobility training;Gait training;Balance training;Moist Heat;Cryotherapy;Manual techniques;Taping;Dry needling   PT Next Visit Plan LE flexibility including manual therapy and possible dry needling to address muscular tightness; core/lumbar stabilization/strengthening exercises; postural training; LE strengthening      Patient will benefit from skilled therapeutic intervention in order to improve the following deficits and impairments:  Pain, Impaired flexibility, Increased muscle spasms, Decreased range of motion, Decreased strength, Decreased endurance, Decreased activity tolerance, Decreased mobility, Postural dysfunction, Improper body mechanics, Impaired perceived functional ability, Decreased knowledge of precautions  Visit Diagnosis: Abnormal posture  Muscle weakness (generalized)  Pain in thoracic spine  Cramp and spasm     Problem List Patient Active Problem List   Diagnosis Date Noted  . Phlebitis or thrombophlebitis of lower extremity 07/21/2016  . Visit for preventive health examination 05/16/2016  .  Aphthous ulcer 05/16/2016  . Ureterolithiasis 05/16/2016  . Acute deep vein thrombosis (DVT) of femoral vein of left lower extremity ([place]) 03/21/2016  . Multiple myeloma ([place]) 02/22/2016  . Multiple myeloma not having achieved remission ([place]) 02/22/2016  . LBP (low back pain) 11/02/2013  . Bilateral inguinal hernia (BIH), s/p lap  repair 12/16/2013 11/02/2013    Bess Harvest, PTA 01/22/17 6:21 PM  [facility] [address]  [place], [place], [address] Phone: [phone]   Fax:  [phone]  Name: Jorge [name] MRN: [id number] Date of Birth: 1956/05/08

## 2017-01-22 NOTE — Telephone Encounter (Signed)
Patient is c/o increase in severity of restless leg syndrome especially at night. The magnesium and tonic water are no longer working for symptom control. He is taking Ambien to help sleep, but it's not keeping him asleep when the symptoms start.  Reviewed with Laverna Peace NP and she would like patient to try requip for symptoms relief.  Patient aware of new prescription and pharmacy confirmed.

## 2017-01-23 ENCOUNTER — Other Ambulatory Visit: Payer: 59

## 2017-01-26 ENCOUNTER — Telehealth: Payer: Self-pay | Admitting: *Deleted

## 2017-01-26 ENCOUNTER — Ambulatory Visit (HOSPITAL_BASED_OUTPATIENT_CLINIC_OR_DEPARTMENT_OTHER)
Admission: RE | Admit: 2017-01-26 | Discharge: 2017-01-26 | Disposition: A | Discharge status: Home / Self Care | Payer: 59 | Source: Ambulatory Visit | Attending: Family | Admitting: Family

## 2017-01-26 ENCOUNTER — Other Ambulatory Visit: Payer: Self-pay | Admitting: Family

## 2017-01-26 DIAGNOSIS — C9000 Multiple myeloma not having achieved remission: Secondary | ICD-10-CM | POA: Insufficient documentation

## 2017-01-26 DIAGNOSIS — M7989 Other specified soft tissue disorders: Secondary | ICD-10-CM | POA: Diagnosis not present

## 2017-01-26 DIAGNOSIS — M79671 Pain in right foot: Secondary | ICD-10-CM

## 2017-01-26 NOTE — Progress Notes (Unsigned)
DG right foot

## 2017-01-26 NOTE — Telephone Encounter (Signed)
Patient c/o pain to his RIGHT foot with ambulation. He has recently tried to increase his activity and is walking frequently. Over the weekend he noticed that the ball, under the toes, of his RIGHT foot started to hurt. The pain is now severe. When ambulating he states its a 7/8. He is concerned that there may be a broken bone.  Reviewed symptoms with [name]. He would like the patient to have an xray. Order entered and patient aware. He will come sometime today to have the foot x-rayed.  Will follow up depending on results.

## 2017-01-27 ENCOUNTER — Other Ambulatory Visit: Payer: Self-pay | Admitting: Hematology & Oncology

## 2017-01-27 ENCOUNTER — Telehealth: Payer: Self-pay | Admitting: *Deleted

## 2017-01-27 ENCOUNTER — Ambulatory Visit: Payer: 59

## 2017-01-27 ENCOUNTER — Other Ambulatory Visit: Payer: Self-pay | Admitting: Family

## 2017-01-27 DIAGNOSIS — C9000 Multiple myeloma not having achieved remission: Secondary | ICD-10-CM

## 2017-01-27 DIAGNOSIS — M79671 Pain in right foot: Secondary | ICD-10-CM

## 2017-01-27 NOTE — Telephone Encounter (Addendum)
Patient is aware of results. He will follow up with Raliegh Ip and see about them assessing the issue.   Patient wants to know if he can take ibuprofen for inflammation.  Per [name] he can take 200mg  ibuprofen every 6hours with food. Patient understands.  ----- Message from Eliezer Bottom, NP sent at 01/26/2017  4:42 PM EDT ----- Regarding: Marlis Edelson was negative. Thank you!  Sarah  ----- Message ----- From: Buel Ream, Rad Results In Sent: 01/26/2017  11:37 AM To: Eliezer Bottom, NP

## 2017-01-28 ENCOUNTER — Other Ambulatory Visit: Payer: Self-pay | Admitting: *Deleted

## 2017-01-28 ENCOUNTER — Ambulatory Visit (INDEPENDENT_AMBULATORY_CARE_PROVIDER_SITE_OTHER): Payer: 59 | Admitting: Family Medicine

## 2017-01-28 VITALS — BP 118/70 | Ht 67.0 in | Wt 160.0 lb

## 2017-01-28 DIAGNOSIS — I8290 Acute embolism and thrombosis of unspecified vein: Secondary | ICD-10-CM

## 2017-01-28 DIAGNOSIS — M79671 Pain in right foot: Secondary | ICD-10-CM | POA: Diagnosis not present

## 2017-01-28 DIAGNOSIS — M7989 Other specified soft tissue disorders: Secondary | ICD-10-CM

## 2017-01-28 DIAGNOSIS — C9000 Multiple myeloma not having achieved remission: Secondary | ICD-10-CM

## 2017-01-28 DIAGNOSIS — G8929 Other chronic pain: Secondary | ICD-10-CM

## 2017-01-28 DIAGNOSIS — M545 Low back pain: Secondary | ICD-10-CM

## 2017-01-28 DIAGNOSIS — D472 Monoclonal gammopathy: Secondary | ICD-10-CM

## 2017-01-28 MED ORDER — RIVAROXABAN 10 MG PO TABS: 10.0000 mg | ORAL_TABLET | Freq: Every day | ORAL | 12 refills | Status: DC

## 2017-01-28 NOTE — Progress Notes (Signed)
Chief complaint:  Right foot pain 4 days  History of present illness: Jorge [name] is a 61 year old Caucasian male, past medical history notable for multiple myeloma diagnosed 1 year ago, history of DVT of left lower extremity on Xarelto, multiple vertebral compression fractures, status post kyphoplasty, who presents to the sports medicine office today, accompanied by wife, with chief complaint of right foot pain. He points to pain on the plantar aspect of right foot near the second and third metatarsal head. He reports that he was walking 4 days ago when he started feeling pain on the internal aspect of his right foot near the second and third metatarsal head. He reports that over the last 4-6 weeks he has increased walking distance and frequency to gain strength. He reports that he has been walking on a daily basis, covering 20 miles a week. He reports that usually 5-6 days a week he walks 2-3 miles, but one day a week he walks 6-7 miles. He reports 4 days ago he was walking for 7 miles, started noticing pain at the 6 mile mark. He reports that he was able to finish to walk, but rested the rest of the day and iced. He reports next day after walking 2-3 miles he noticed pain becoming more prominent in the same area. He describes the pain as a sharp throbbing pain. He has not reported any radiation of pain. It is not report of any numbness, tingling, or weakness in right lower extremity. He does not report of any warmth, erythema, ecchymosis of the right foot. Both patient and wife noticed swelling on the right second and third digit over the last few days. He has been icing his foot 3-4 times a day. He reports that his oncologist told him that he could take ibuprofen 200 mg, but nothing stronger due to being on Xarelto. He reports that he normally wears [employer], but he reports that the tennis shoes or old, has had them for 2 years. He did not bring them into the office today.  Past medical  history: Multiple myeloma diagnosed 1 year ago Left lower extremity DVT, on anticoagulation Chronic low back pain, secondary to multiple vertebral compression fractures, status post kyphoplasty and vertebral plasty Bilateral inguinal hernia repair, status post lap repair Ureterolithiasis  Past surgical history: Bilateral inguinal hernia repair Multiple vertebral plasty and kyphoplasty Cystoscopy and ureteroscopy Lumbar ablation  Family history:  Does not report of any family history of hypertension or diabetes  Social history: Not report of any current tobacco, alcohol, or illicit drug use, is married, lives at home with his wife  Allergies: No known drug allergies  Review of systems: As noted above in the HPI  Physical exam: Vital signs reviewed and are documented in chart General: Alert, oriented, appears stated age, in no apparent distress HEENT: Moist oral mucosa Respiratory: Normal respirations, able to speak in full sentences Cardiac: Normal peripheral pulses, regular rate Integumentary: No rashes on visible skin Neurologic: Strength and sensation intact in bilateral lower extremities Psychiatric: Appropriate affect and mood Musculoskeletal: Inspection of right foot does not reveal any obvious deformity or atrophy, he does have slight effusion noted and second and third digit on right foot, he is tender to palpation plantar aspect of right foot along the second and third metatarsal head, is also tender to deep palpation on the dorsal aspect of right foot along second and third metatarsal head, no tenderness to palpation along Lisfranc joint, base of fifth metatarsal, and navicular, no tenderness  along medial and lateral malleolus or calcaneus, no pain elicited with ankle dorsiflexion or plantar flexion, pain noticed with weightbearing on right foot, does ambulate with a cane  Ultrasound:  Ultrasound of the right foot was performed by Dr. Micheline Chapman, to see hypoechoic changes and  effusion noted on the distal aspect of second metatarsal near metatarsal head on long axis view, on short axis view there is a questionable cortical defect on distal aspect, on color Doppler view, do seem minimal neovascularity.  Ultrasound impression: Question stress reaction versus early stress fracture of second metatarsal head     Assessment and plan: 1. Right foot pain, suspicion of stress reaction versus early stress fracture of second metatarsal head 2. History of multiple myeloma, currently in remission 3. History of left lower extremity DVT, on anticoagulation  Right foot pain, suspicion of stress reaction versus early stress fracture of second metatarsal head -Given ultrasound results, with questionable cortical defect seen, have more suspicion of early stress fracture, do not expect to see significant swelling and pain dorsally with metatarsalgia -Did have patient try on short boot and postoperative boot, he felt more comfortable and short boot, will have patient wear this at all times with the exception of resting, sleeping, and taking a shower, discussed no recreational walking for the next 3 weeks -Discussed ice, elevation -Given that he is on anticoagulation, pain management is limited, discussed that he can take ibuprofen 200 mg daily -Will repeat ultrasound here in the office in 3 weeks  Will have patient follow-up in 3 weeks or sooner as needed.    Mort Sawyers, MD Primary Care Sports Medicine Fellow [facility]

## 2017-01-29 ENCOUNTER — Ambulatory Visit: Payer: 59 | Admitting: Physical Therapy

## 2017-01-29 DIAGNOSIS — M546 Pain in thoracic spine: Secondary | ICD-10-CM

## 2017-01-29 DIAGNOSIS — R293 Abnormal posture: Secondary | ICD-10-CM | POA: Diagnosis not present

## 2017-01-29 DIAGNOSIS — R252 Cramp and spasm: Secondary | ICD-10-CM

## 2017-01-29 DIAGNOSIS — M6281 Muscle weakness (generalized): Secondary | ICD-10-CM

## 2017-01-29 NOTE — Therapy (Signed)
[facility] [address]  [place], [place], [address] Phone: [phone]   Fax:  [phone]  Physical Therapy Treatment  Patient Details  Name: Jorge [name] MRN: [id number] Date of Birth: 02-02-1956 Referring Provider: Durenda Age, MD  Encounter Date: 01/29/2017      PT End of Session - 01/29/17 1537    Visit Number 8   Number of Visits 16   Date for PT Re-Evaluation 02/20/17   Authorization Type UHC   PT Start Time 6962   PT Stop Time 1620   PT Time Calculation (min) 43 min   Activity Tolerance Patient tolerated treatment well   Behavior During Therapy [facility] for tasks assessed/performed      Past Medical History:  Diagnosis Date  . Anxiety   . Bone metastasis ([place])   . Chest cold 05/19/2016   productive cough  -- started on antibiotic  . Chronic back pain    due to bone mets from myeloma  . Cough   . Depression   . GERD (gastroesophageal reflux disease)   . Hiatal hernia   . History of chicken pox   . History of concussion    age 60 -- no residual  . History of DVT of lower extremity 03/21/2016  treated and completed w/ xarelto   per doppler left extensive occlusion common femoral, femoral, and popliteal veins and right partial occlusion common femoral and profunda femoral veins/  last doppler 06-04-2016 no evidence acute or chronic dvt noted either leg   . History of radiation therapy 06/09/16-06/23/16   lower thoracic spine 25 Gy in 10 fractions  . Mouth ulcers    secondary to radiation  . Multiple myeloma ([place]) dx 02/22/2016 via bone marrow bx---  oncologist-  [name]   IgG Kappa-- Hyperdiploid/ +11 w/ bone mets--  current treatment chemotherapy (started 08/ 2017)and pallitive radiation to back started 06-09-2016  . Renal calculus, right   . Wears contact lenses     Past Surgical History:  Procedure Laterality Date  . COLONOSCOPY  [id number]  . CYSTOSCOPY W/ URETERAL STENT  PLACEMENT Right 06/20/2016   Procedure: CYSTOSCOPY WITH STENT REPLACEMENT;  Surgeon: Kathie Rhodes, MD;  Location: [facility];  Service: Urology;  Laterality: Right;  . CYSTOSCOPY WITH RETROGRADE PYELOGRAM, URETEROSCOPY AND STENT PLACEMENT Right 05/30/2016   Procedure: CYSTOSCOPY WITH RETROGRADE PYELOGRAM, URETEROSCOPY AND STENT PLACEMENT,DILITATION URETERAL STRICTURE;  Surgeon: Kathie Rhodes, MD;  Location: [facility];  Service: Urology;  Laterality: Right;  . CYSTOSCOPY/RETROGRADE/URETEROSCOPY/STONE EXTRACTION WITH BASKET Right 06/20/2016   Procedure: CYSTOSCOPY/URETEROSCOPY/STONE EXTRACTION WITH BASKET;  Surgeon: Kathie Rhodes, MD;  Location: [facility];  Service: Urology;  Laterality: Right;  . HOLMIUM LASER APPLICATION Right 95/08/8411   Procedure: HOLMIUM LASER APPLICATION;  Surgeon: Kathie Rhodes, MD;  Location: [facility];  Service: Urology;  Laterality: Right;  . IR GENERIC HISTORICAL  02/11/2016   IR RADIOLOGIST EVAL & MGMT 02/11/2016 [facility]  . IR GENERIC HISTORICAL  02/15/2016   IR BONE TUMOR(S)RF ABLATION 02/15/2016 Luanne Bras, MD [facility]  . IR GENERIC HISTORICAL  02/15/2016   IR BONE TUMOR(S)RF ABLATION 02/15/2016 Luanne Bras, MD [facility]  . IR GENERIC HISTORICAL  02/15/2016   IR BONE TUMOR(S)RF ABLATION 02/15/2016 Luanne Bras, MD [facility]  . IR GENERIC HISTORICAL  02/15/2016   IR KYPHO THORACIC WITH BONE BIOPSY 02/15/2016 Luanne Bras, MD [facility]  . IR GENERIC HISTORICAL  02/15/2016   IR KYPHO  THORACIC WITH BONE BIOPSY 02/15/2016 Luanne Bras, MD [facility]  . IR GENERIC HISTORICAL  02/15/2016   IR VERTEBROPLASTY CERV/THOR BX INC UNI/BIL INC/INJECT/IMAGING 02/15/2016 Luanne Bras, MD [facility]  . IR GENERIC HISTORICAL  03/13/2016   IR KYPHO EA ADDL LEVEL THORACIC OR LUMBAR 03/13/2016 Luanne Bras, MD [facility]  . IR GENERIC HISTORICAL  03/13/2016   IR KYPHO EA ADDL LEVEL THORACIC OR LUMBAR  03/13/2016 Luanne Bras, MD [facility]  . IR GENERIC HISTORICAL  03/13/2016   IR BONE TUMOR(S)RF ABLATION 03/13/2016 Luanne Bras, MD [facility]  . IR GENERIC HISTORICAL  03/13/2016   IR KYPHO LUMBAR INC FX REDUCE BONE BX UNI/BIL CANNULATION INC/IMAGING 03/13/2016 Luanne Bras, MD [facility]  . IR GENERIC HISTORICAL  03/13/2016   IR BONE TUMOR(S)RF ABLATION 03/13/2016 Luanne Bras, MD [facility]  . IR GENERIC HISTORICAL  03/13/2016   IR BONE TUMOR(S)RF ABLATION 03/13/2016 Luanne Bras, MD [facility]  . IR GENERIC HISTORICAL  03/31/2016   IR RADIOLOGIST EVAL & MGMT 03/31/2016 [facility]  . LAPAROSCOPIC INGUINAL HERNIA REPAIR Bilateral 12-16-2013  dr gross  . RADIOLOGY WITH ANESTHESIA N/A 02/15/2016   Procedure: Spinal Ablation;  Surgeon: Luanne Bras, MD;  Location: [facility];  Service: Radiology;  Laterality: N/A;  . RADIOLOGY WITH ANESTHESIA N/A 03/13/2016   Procedure: LUMBER ABLATION;  Surgeon: Luanne Bras, MD;  Location: [place];  Service: Radiology;  Laterality: N/A;  . ROTATOR CUFF REPAIR Right 2003  . TONSILLECTOMY  age 55  . WISDOM TOOTH EXTRACTION      There were no vitals filed for this visit.      Subjective Assessment - 01/29/17 1537    Subjective Pt noting onset of R foot pain after walking on Sat. Saw MD and foot was x-rayed revealing 2nd metatarsal fracture. Pt now in walking shoe & instructed to defer exercise walking x 3 wks.   Pertinent History T6 & T8 kypholoplasty on 11/21/16. Multiple myeloma with multiple vertebral compression fractures with kyphoplasties and vertbroloplasties. Stem cell tranplant on 08/22/16 at [facility]. He follows up regularly with Dr. Marin Olp. He is referred for OP PT for "eval & treat, therapeutic exercises: strengthing/conditioning, therapeutic activities: conditioning"   Limitations Sitting;Standing;Walking   Patient Stated Goals "gain more strength in legs and back"   Currently in Pain? Yes   Pain Score 3    Pain  Location Back   Pain Orientation Right;Medial   Pain Descriptors / Indicators Aching   Pain Type Chronic pain                         [facility] Adult PT Treatment/Exercise - 01/29/17 1537      Lumbar Exercises: Aerobic   UBE (Upper Arm Bike) lvl 2.0 fwd/back x 3' each     Lumbar Exercises: Seated   Long Arc Quad on [place] Both;10 reps   LAQ on [employer] (lbs) green Pball   Hip Flexion on Ball Both;15 reps   Hip Flexion on Ball Limitations green Pball   Other Seated Lumbar Exercises B row & shoulder extension with green TB 20x3', B pallof press 10x3" with single green TB seated on green (65 cm) Pball; cues to maintain neutral pelvis position     Lumbar Exercises: Supine   Dead Bug 15 reps;2 seconds   Dead Bug Limitations 2# db (UE) & ankle wts (LE); LE from table top, UE at 90 dg flexion   Bridge 15 reps;3 seconds   Bridge Limitations  from heels + hip ABD isometric with blue TB   Isometric Hip Flexion 15 reps;3 seconds   Other Supine Lumbar Exercises straight leg bridge with heels on peanut ball 15x3"     Lumbar Exercises: Quadruped   Opposite Arm/Leg Raise Right arm/Left leg;Left arm/Right leg;15 reps;3 seconds   Other Quadruped Lumbar Exercises Prone rocking from child's pose into cobra pose 15x5"     Shoulder Exercises: Prone   Extension Both;10 reps   Extension Weight (lbs) 1   Extension Limitations "I's" - leaning over green Pball from knees resting on mat table   External Rotation Both;10 reps   External Rotation Weight (lbs) 1   External Rotation Limitations "W's" - leaning over green Pball from knees resting on mat table   Horizontal ABduction 1 Both;10 reps   Horizontal ABduction 1 Limitations "T's" - leaning over green Pball from knees resting on mat table   Horizontal ABduction 2 Both;10 reps   Horizontal ABduction 2 Limitations "Y's" - leaning over green Pball from knees resting on mat table                  PT Short Term Goals - 01/05/17  1639      PT SHORT TERM GOAL #1   Title independent with initial HEP by 01/09/17   Status Achieved           PT Long Term Goals - 12/29/16 1711      PT LONG TERM GOAL #1   Title Independent with ongoing HEP +/- gym program to increase back extension & LE strength and decrease pain by 02/20/17   Status On-going     PT LONG TERM GOAL #2   Title B LE strength >/= 4+/5 for improved ease of mobility by 02/20/17   Status On-going     PT LONG TERM GOAL #3   Title Pt will tolerate standing in the kitchen for >/= 45 minutes w/o limitation due to back pain to allow for meal prep by 02/20/17   Status On-going     PT LONG TERM GOAL #4   Title Pt will be able to perform a deep squat with good body mechanics to allow safe retrieval of objects from floor by 02/20/17   Status On-going               Plan - 01/29/17 1545    Clinical Impression Statement Pt presents to PT in walking shoe with newly diagnosed stress fracture in R 2nd metatarsal presumably resulting from walking program. Pt restricted from walking for exercise x at least 3 weeks. Therapeutic exercises adjusted and modified to limit weight bearing/pressure through R foot. Pt able to complete exercise program w/o increased pain in back or R foot.   Clinical Impairments Affecting Rehab Potential T6 & T8 kypholoplasty on 11/21/16. Multiple myeloma with multiple vertebral compression fractures with kyphoplasties and vertbroloplasties. Stem cell transplant on 08/22/16 at [facility]. Regular ongoing f/u with oncologist, Dr. Marin Olp. Due to extensive vertebral involvement and compression fractures, will avoid all flexion based activity and lifting >10 pounds.    PT Treatment/Interventions Patient/family education;ADLs/Self Care Home Management;Energy conservation;Neuromuscular re-education;Therapeutic exercise;Therapeutic activities;Functional mobility training;Gait training;Balance training;Moist Heat;Cryotherapy;Manual techniques;Taping;Dry needling    PT Next Visit Plan LE flexibility including manual therapy and possible dry needling to address muscular tightness; core/lumbar stabilization/strengthening exercises; postural training; LE strengthening - limit weightbearing/resistance/pressure through R foot   Consulted and Agree with Plan of Care Patient      Patient will benefit from skilled  therapeutic intervention in order to improve the following deficits and impairments:  Pain, Impaired flexibility, Increased muscle spasms, Decreased range of motion, Decreased strength, Decreased endurance, Decreased activity tolerance, Decreased mobility, Postural dysfunction, Improper body mechanics, Impaired perceived functional ability, Decreased knowledge of precautions  Visit Diagnosis: Abnormal posture  Muscle weakness (generalized)  Pain in thoracic spine  Cramp and spasm     Problem List Patient Active Problem List   Diagnosis Date Noted  . Phlebitis or thrombophlebitis of lower extremity 07/21/2016  . Visit for preventive health examination 05/16/2016  . Aphthous ulcer 05/16/2016  . Ureterolithiasis 05/16/2016  . Acute deep vein thrombosis (DVT) of femoral vein of left lower extremity ([place]) 03/21/2016  . Multiple myeloma ([place]) 02/22/2016  . Multiple myeloma not having achieved remission ([place]) 02/22/2016  . LBP (low back pain) 11/02/2013  . Bilateral inguinal hernia (BIH), s/p lap repair 12/16/2013 11/02/2013    Percival Spanish, PT, MPT 01/29/2017, 4:51 PM  [facility] [address]  [place], [place], [address] Phone: [phone]   Fax:  [phone]  Name: Jorge [name] MRN: [id number] Date of Birth: June 06, 1956

## 2017-01-30 ENCOUNTER — Other Ambulatory Visit: Payer: Self-pay | Admitting: *Deleted

## 2017-01-30 ENCOUNTER — Other Ambulatory Visit: Payer: 59

## 2017-01-30 DIAGNOSIS — C9000 Multiple myeloma not having achieved remission: Secondary | ICD-10-CM

## 2017-02-02 ENCOUNTER — Other Ambulatory Visit (HOSPITAL_BASED_OUTPATIENT_CLINIC_OR_DEPARTMENT_OTHER): Payer: 59

## 2017-02-02 ENCOUNTER — Ambulatory Visit (HOSPITAL_BASED_OUTPATIENT_CLINIC_OR_DEPARTMENT_OTHER): Payer: 59

## 2017-02-02 VITALS — BP 106/69 | HR 61 | Temp 98.0°F | Resp 20

## 2017-02-02 DIAGNOSIS — C9000 Multiple myeloma not having achieved remission: Secondary | ICD-10-CM | POA: Diagnosis not present

## 2017-02-02 LAB — CMP (CANCER CENTER ONLY)
ALK PHOS: 53 U/L (ref 26–84)
ALT: 36 U/L (ref 10–47)
AST: 34 U/L (ref 11–38)
Albumin: 3.5 g/dL (ref 3.3–5.5)
BUN: 14 mg/dL (ref 7–22)
CALCIUM: 8.8 mg/dL (ref 8.0–10.3)
CO2: 30 mEq/L (ref 18–33)
Chloride: 105 mEq/L (ref 98–108)
Creat: 1.1 mg/dl (ref 0.6–1.2)
Glucose, Bld: 121 mg/dL — ABNORMAL HIGH (ref 73–118)
POTASSIUM: 3.9 meq/L (ref 3.3–4.7)
Sodium: 140 mEq/L (ref 128–145)
TOTAL PROTEIN: 6.2 g/dL — AB (ref 6.4–8.1)
Total Bilirubin: 0.7 mg/dl (ref 0.20–1.60)

## 2017-02-02 LAB — CBC WITH DIFFERENTIAL (CANCER CENTER ONLY)
BASO#: 0 10*3/uL (ref 0.0–0.2)
BASO%: 0.8 % (ref 0.0–2.0)
EOS%: 8.5 % — ABNORMAL HIGH (ref 0.0–7.0)
Eosinophils Absolute: 0.2 10*3/uL (ref 0.0–0.5)
HEMATOCRIT: 34.7 % — AB (ref 38.7–49.9)
HGB: 11.1 g/dL — ABNORMAL LOW (ref 13.0–17.1)
LYMPH#: 0.8 10*3/uL — AB (ref 0.9–3.3)
LYMPH%: 31.5 % (ref 14.0–48.0)
MCH: 27.5 pg — ABNORMAL LOW (ref 28.0–33.4)
MCHC: 32 g/dL (ref 32.0–35.9)
MCV: 86 fL (ref 82–98)
MONO#: 0.4 10*3/uL (ref 0.1–0.9)
MONO%: 15 % — ABNORMAL HIGH (ref 0.0–13.0)
NEUT#: 1.2 10*3/uL — ABNORMAL LOW (ref 1.5–6.5)
NEUT%: 44.2 % (ref 40.0–80.0)
PLATELETS: 133 10*3/uL — AB (ref 145–400)
RBC: 4.04 10*6/uL — ABNORMAL LOW (ref 4.20–5.70)
RDW: 15.6 % (ref 11.1–15.7)
WBC: 2.6 10*3/uL — AB (ref 4.0–10.0)

## 2017-02-02 MED ORDER — SODIUM CHLORIDE 0.9 % IV SOLN
Freq: Once | INTRAVENOUS | Status: AC
  Administered 2017-02-02: 16:00:00 via INTRAVENOUS

## 2017-02-02 MED ORDER — ZOLEDRONIC ACID 4 MG/100ML IV SOLN
4.0000 mg | Freq: Once | INTRAVENOUS | Status: AC
  Administered 2017-02-02: 4 mg via INTRAVENOUS
  Filled 2017-02-02: qty 100

## 2017-02-02 NOTE — Patient Instructions (Signed)

## 2017-02-03 ENCOUNTER — Ambulatory Visit: Payer: 59 | Admitting: Physical Therapy

## 2017-02-03 DIAGNOSIS — M546 Pain in thoracic spine: Secondary | ICD-10-CM

## 2017-02-03 DIAGNOSIS — R252 Cramp and spasm: Secondary | ICD-10-CM

## 2017-02-03 DIAGNOSIS — R293 Abnormal posture: Secondary | ICD-10-CM | POA: Diagnosis not present

## 2017-02-03 DIAGNOSIS — M6281 Muscle weakness (generalized): Secondary | ICD-10-CM

## 2017-02-03 NOTE — Therapy (Signed)
Jorge [name] [address]  [place], [place], [address] Phone: [phone]   Fax:  [phone]  Physical Therapy Treatment  Patient Details  Name: Jorge [name] MRN: [id number] Date of Birth: [birth year] Referring Provider: Durenda Age, MD  Encounter Date: 02/03/2017      PT End of Session - 02/03/17 1619    Visit Number 9   Number of Visits 16   Date for PT Re-Evaluation 02/20/17   Authorization Type UHC   PT Start Time 1619   PT Stop Time 1705   PT Time Calculation (min) 46 min   Activity Tolerance Patient tolerated treatment well   Behavior During Therapy [facility] for tasks assessed/performed      Past Medical History:  Diagnosis Date  . Anxiety   . Bone metastasis ([place])   . Chest cold 05/19/2016   productive cough  -- started on antibiotic  . Chronic back pain    due to bone mets from myeloma  . Cough   . Depression   . GERD (gastroesophageal reflux disease)   . Hiatal hernia   . History of chicken pox   . History of concussion    [name] 75 -- no residual  . History of DVT of lower extremity 03/21/2016  treated and completed w/ xarelto   per doppler left extensive occlusion common femoral, femoral, and popliteal veins and right partial occlusion common femoral and profunda femoral veins/  last doppler 06-04-2016 no evidence acute or chronic dvt noted either leg   . History of radiation therapy 06/09/16-06/23/16   lower thoracic spine 25 Gy in 10 fractions  . Mouth ulcers    secondary to radiation  . Multiple myeloma ([place]) dx 02/22/2016 via bone marrow bx---  oncologist-  dr Jorge [name]   IgG Kappa-- Hyperdiploid/ +11 w/ bone mets--  current treatment chemotherapy (started 08/ 2017)and pallitive radiation to back started 06-09-2016  . Renal calculus, right   . Wears contact lenses     Past Surgical History:  Procedure Laterality Date  . COLONOSCOPY  [id number]  . CYSTOSCOPY W/ URETERAL STENT  PLACEMENT Right 06/20/2016   Procedure: CYSTOSCOPY WITH STENT REPLACEMENT;  Surgeon: Kathie Rhodes, MD;  Location: [facility];  Service: Urology;  Laterality: Right;  . CYSTOSCOPY WITH RETROGRADE PYELOGRAM, URETEROSCOPY AND STENT PLACEMENT Right 05/30/2016   Procedure: CYSTOSCOPY WITH RETROGRADE PYELOGRAM, URETEROSCOPY AND STENT PLACEMENT,DILITATION URETERAL STRICTURE;  Surgeon: Kathie Rhodes, MD;  [facility];  Service: Urology;  Laterality: Right;  . CYSTOSCOPY/RETROGRADE/URETEROSCOPY/STONE EXTRACTION WITH BASKET Right 06/20/2016   Procedure: CYSTOSCOPY/URETEROSCOPY/STONE EXTRACTION WITH BASKET;  Surgeon: Kathie Rhodes, MD;  Location: [facility];  Service: Urology;  Laterality: Right;  . HOLMIUM LASER APPLICATION Right 94/01/6545   Procedure: HOLMIUM LASER APPLICATION;  Surgeon: Kathie Rhodes, MD;  Location: [facility];  Service: Urology;  Laterality: Right;  . IR GENERIC HISTORICAL  02/11/2016   IR RADIOLOGIST EVAL & MGMT 02/11/2016 [facility]  . IR GENERIC HISTORICAL  02/15/2016   IR BONE TUMOR(S)RF ABLATION 02/15/2016 Luanne Bras, MD [facility]  . IR GENERIC HISTORICAL  02/15/2016   IR BONE TUMOR(S)RF ABLATION 02/15/2016 Luanne Bras, MD [facility]  . IR GENERIC HISTORICAL  02/15/2016   IR BONE TUMOR(S)RF ABLATION 02/15/2016 Luanne Bras, MD [facility]  . IR GENERIC HISTORICAL  02/15/2016   IR KYPHO THORACIC WITH BONE BIOPSY 02/15/2016 Luanne Bras, MD [facility]  . IR GENERIC HISTORICAL  02/15/2016   IR KYPHO  THORACIC WITH BONE BIOPSY 02/15/2016 Luanne Bras, MD [facility]  . IR GENERIC HISTORICAL  02/15/2016   IR VERTEBROPLASTY CERV/THOR BX INC UNI/BIL INC/INJECT/IMAGING 02/15/2016 Luanne Bras, MD [facility]  . IR GENERIC HISTORICAL  03/13/2016   IR KYPHO EA ADDL LEVEL THORACIC OR LUMBAR 03/13/2016 Luanne Bras, MD [facility]  . IR GENERIC HISTORICAL  03/13/2016   IR KYPHO EA ADDL LEVEL THORACIC OR LUMBAR  03/13/2016 Luanne Bras, MD [facility]  . IR GENERIC HISTORICAL  03/13/2016   IR BONE TUMOR(S)RF ABLATION 03/13/2016 Luanne Bras, MD [facility]  . IR GENERIC HISTORICAL  03/13/2016   IR KYPHO LUMBAR INC FX REDUCE BONE BX UNI/BIL CANNULATION INC/IMAGING 03/13/2016 Luanne Bras, MD [facility]  . IR GENERIC HISTORICAL  03/13/2016   IR BONE TUMOR(S)RF ABLATION 03/13/2016 Luanne Bras, MD [facility]  . IR GENERIC HISTORICAL  03/13/2016   IR BONE TUMOR(S)RF ABLATION 03/13/2016 Luanne Bras, MD [facility]  . IR GENERIC HISTORICAL  03/31/2016   IR RADIOLOGIST EVAL & MGMT 03/31/2016 [facility]  . LAPAROSCOPIC INGUINAL HERNIA REPAIR Bilateral 12-16-2013  dr gross  . RADIOLOGY WITH ANESTHESIA N/A 02/15/2016   Procedure: Spinal Ablation;  Surgeon: Luanne Bras, MD;  Location: [facility];  Service: Radiology;  Laterality: N/A;  . RADIOLOGY WITH ANESTHESIA N/A 03/13/2016   Procedure: LUMBER ABLATION;  Surgeon: Luanne Bras, MD;  Location: [facility] [facility];  Service: Radiology;  Laterality: N/A;  . ROTATOR CUFF REPAIR Right 2003  . TONSILLECTOMY  [name] 86  . WISDOM TOOTH EXTRACTION      There were no vitals filed for this visit.      Subjective Assessment - 02/03/17 1625    Subjective Pt frustrated by inabiliity to walk due to metatarsal fracture.   Pertinent History T6 & T8 kypholoplasty on 11/21/16. Multiple myeloma with multiple vertebral compression fractures with kyphoplasties and vertbroloplasties. Stem cell tranplant on 08/22/16 at [facility]. He follows up regularly with Dr. Marin [name]. He is referred for OP PT for "eval & treat, therapeutic exercises: strengthing/conditioning, therapeutic activities: conditioning"   Limitations Sitting;Standing;Walking   Patient Stated Goals "gain more strength in legs and back"   Currently in Pain? Yes   Pain Score 2    Pain Location Back   Pain Orientation Right;Medial;Mid   Pain Type Chronic pain                          [facility] Adult PT Treatment/Exercise - 02/03/17 1619      Lumbar Exercises: Aerobic   UBE (Upper Arm Bike) lvl 2.0 fwd/back x 3' each     Lumbar Exercises: Machines for Strengthening   Cybex Knee Extension 25# x15 B LE; 15# B con/unilateral ecc x10 each   Cybex Knee Flexion 35# x15 B LE; 25# B con/unilateral ecc x10 each   Other Lumbar Machine Exercise BATCA low row 35# x15, unilateral row 20# x15 each   Other Lumbar Machine Exercise BATCA cable B pallof press 5# x15     Lumbar Exercises: Standing   Row Left;15 reps;Strengthening   Theraband Level (Row) Level 3 (Green)   Row Limitations "W" row   Other Standing Lumbar Exercises B pallof press + shoulder flexion with green TB x10 each   Other Standing Lumbar Exercises TRX low row x15     Lumbar Exercises: Prone   Other Prone Lumbar Exercises peanut ball plank walk-out x4 (deferred due to increased thoracic back pain)     Manual Therapy  Manual Therapy Soft tissue mobilization;Myofascial release;Taping   Soft tissue mobilization L lower thoracic paraspinals   Myofascial Release TPR to L lower thoracic paraspinals   Kinesiotex Inhibit Muscle     Kinesiotix   Inhibit Muscle  2 parallel "I" strips 30-50% along paraspinals from mid lumbar to upper thoracic spine                  PT Short Term Goals - 01/05/17 1639      PT SHORT TERM GOAL #1   Title independent with initial HEP by 01/09/17   Status Achieved           PT Long Term Goals - 12/29/16 1711      PT LONG TERM GOAL #1   Title Independent with ongoing HEP +/- gym program to increase back extension & LE strength and decrease pain by 02/20/17   Status On-going     PT LONG TERM GOAL #2   Title B LE strength >/= 4+/5 for improved ease of mobility by 02/20/17   Status On-going     PT LONG TERM GOAL #3   Title Pt will tolerate standing in the kitchen for >/= 45 minutes w/o limitation due to back pain to allow for meal prep by 02/20/17   Status On-going     PT  LONG TERM GOAL #4   Title Pt will be able to perform a deep squat with good body mechanics to allow safe retrieval of objects from floor by 02/20/17   Status On-going               Plan - 02/03/17 1700    Clinical Impression Statement Pt able to resume some of standing based lumbar stabilization exercises (static stance based) w/o irritating R 2nd metatarsal fracture but continue to avoid dymamic standing activities such as squats and lunges. Pt continuing to tolerate progression of resistance and complexity of exercises w/o increased pain except with attempt at peanut ball plank walk-out. Increased muscle tension persists in lumbar paraspinals with some response noted to STM but still awating MD clearance for trial of DN or use of estim. Pt to attempt to contact MD for f/u on PT inquiry.   Clinical Impairments Affecting Rehab Potential T6 & T8 kypholoplasty on 11/21/16. Multiple myeloma with multiple vertebral compression fractures with kyphoplasties and vertbroloplasties. Stem cell transplant on 08/22/16 at [facility]. Regular ongoing f/u with oncologist, Dr. Marin [name]. Due to extensive vertebral involvement and compression fractures, will avoid all flexion based activity and lifting >10 pounds.    PT Treatment/Interventions Patient/family education;ADLs/Self Care Home Management;Energy conservation;Neuromuscular re-education;Therapeutic exercise;Therapeutic activities;Functional mobility training;Gait training;Balance training;Moist Heat;Cryotherapy;Manual techniques;Taping;Dry needling   PT Next Visit Plan LE flexibility including manual therapy and possible dry needling to address muscular tightness; core/lumbar stabilization/strengthening exercises; postural training; LE strengthening - limit weightbearing/resistance/pressure through R foot   Consulted and Agree with Plan of Care Patient      Patient will benefit from skilled therapeutic intervention in order to improve the following deficits and  impairments:  Pain, Impaired flexibility, Increased muscle spasms, Decreased range of motion, Decreased strength, Decreased endurance, Decreased activity tolerance, Decreased mobility, Postural dysfunction, Improper body mechanics, Impaired perceived functional ability, Decreased knowledge of precautions  Visit Diagnosis: Abnormal posture  Muscle weakness (generalized)  Pain in thoracic spine  Cramp and spasm     Problem List Patient Active Problem List   Diagnosis Date Noted  . Phlebitis or thrombophlebitis of lower extremity 07/21/2016  . Visit for preventive health examination  05/16/2016  . Aphthous ulcer 05/16/2016  . Ureterolithiasis 05/16/2016  . Acute deep vein thrombosis (DVT) of femoral vein of left lower extremity ([place]) 03/21/2016  . Multiple myeloma ([place]) 02/22/2016  . Multiple myeloma not having achieved remission ([place]) 02/22/2016  . LBP (low back pain) 11/02/2013  . Bilateral inguinal hernia (BIH), s/p lap repair 12/16/2013 11/02/2013    Percival Spanish, PT, MPT 02/03/2017, 6:22 PM  [facility] [address]  [place], [place], [address] Phone: [phone]   Fax:  [phone]  Name: Jorge [name] MRN: [id number] Date of Birth: 1956-02-05

## 2017-02-05 ENCOUNTER — Ambulatory Visit: Payer: 59 | Admitting: Physical Therapy

## 2017-02-05 DIAGNOSIS — R252 Cramp and spasm: Secondary | ICD-10-CM

## 2017-02-05 DIAGNOSIS — R293 Abnormal posture: Secondary | ICD-10-CM | POA: Diagnosis not present

## 2017-02-05 DIAGNOSIS — M546 Pain in thoracic spine: Secondary | ICD-10-CM

## 2017-02-05 DIAGNOSIS — M6281 Muscle weakness (generalized): Secondary | ICD-10-CM

## 2017-02-05 NOTE — Therapy (Signed)
[facility] [address]  [place], [place], [address] Phone: [phone]   Fax:  [phone]  Physical Therapy Treatment  Patient Details  Name: Jorge [name] MRN: [id number] Date of Birth: 26-Dec-1955 Referring Provider: Durenda Age, MD  Encounter Date: 02/05/2017      PT End of Session - 02/05/17 1700    Visit Number 10   Number of Visits 16   Date for PT Re-Evaluation 02/20/17   Authorization Type UHC   PT Start Time 1700   PT Stop Time 1745   PT Time Calculation (min) 45 min   Activity Tolerance Patient tolerated treatment well   Behavior During Therapy [facility] for tasks assessed/performed      Past Medical History:  Diagnosis Date  . Anxiety   . Bone metastasis ([place])   . Chest cold 05/19/2016   productive cough  -- started on antibiotic  . Chronic back pain    due to bone mets from myeloma  . Cough   . Depression   . GERD (gastroesophageal reflux disease)   . Hiatal hernia   . History of chicken pox   . History of concussion    age 26 -- no residual  . History of DVT of lower extremity 03/21/2016  treated and completed w/ xarelto   per doppler left extensive occlusion common femoral, femoral, and popliteal veins and right partial occlusion common femoral and profunda femoral veins/  last doppler 06-04-2016 no evidence acute or chronic dvt noted either leg   . History of radiation therapy 06/09/16-06/23/16   lower thoracic spine 25 Gy in 10 fractions  . Mouth ulcers    secondary to radiation  . Multiple myeloma ([employer]) dx 02/22/2016 via bone marrow bx---  oncologist-  [name]   IgG Kappa-- Hyperdiploid/ +11 w/ bone mets--  current treatment chemotherapy (started 08/ 2017)and pallitive radiation to back started 06-09-2016  . Renal calculus, right   . Wears contact lenses     Past Surgical History:  Procedure Laterality Date  . COLONOSCOPY  [id number]  . CYSTOSCOPY W/ URETERAL STENT  PLACEMENT Right 06/20/2016   Procedure: CYSTOSCOPY WITH STENT REPLACEMENT;  Surgeon: Kathie Rhodes, MD;  Location: [facility];  Service: Urology;  Laterality: Right;  . CYSTOSCOPY WITH RETROGRADE PYELOGRAM, URETEROSCOPY AND STENT PLACEMENT Right 05/30/2016   Procedure: CYSTOSCOPY WITH RETROGRADE PYELOGRAM, URETEROSCOPY AND STENT PLACEMENT,DILITATION URETERAL STRICTURE;  Surgeon: Kathie Rhodes, MD;  Location: [facility];  Service: Urology;  Laterality: Right;  . CYSTOSCOPY/RETROGRADE/URETEROSCOPY/STONE EXTRACTION WITH BASKET Right 06/20/2016   Procedure: CYSTOSCOPY/URETEROSCOPY/STONE EXTRACTION WITH BASKET;  Surgeon: Kathie Rhodes, MD;  Location: [facility];  Service: Urology;  Laterality: Right;  . HOLMIUM LASER APPLICATION Right 15/02/3093   Procedure: HOLMIUM LASER APPLICATION;  Surgeon: Kathie Rhodes, MD;  Location: [facility];  Service: Urology;  Laterality: Right;  . IR GENERIC HISTORICAL  02/11/2016   IR RADIOLOGIST EVAL & MGMT 02/11/2016 [facility]  . IR GENERIC HISTORICAL  02/15/2016   IR BONE TUMOR(S)RF ABLATION 02/15/2016 Luanne Bras, MD [facility]  . IR GENERIC HISTORICAL  02/15/2016   IR BONE TUMOR(S)RF ABLATION 02/15/2016 Luanne Bras, MD [facility]  . IR GENERIC HISTORICAL  02/15/2016   IR BONE TUMOR(S)RF ABLATION 02/15/2016 Luanne Bras, MD [facility]  . IR GENERIC HISTORICAL  02/15/2016   IR KYPHO THORACIC WITH BONE BIOPSY 02/15/2016 Luanne Bras, MD [facility]  . IR GENERIC HISTORICAL  02/15/2016   IR KYPHO  THORACIC WITH BONE BIOPSY 02/15/2016 Luanne Bras, MD [facility]  . IR GENERIC HISTORICAL  02/15/2016   IR VERTEBROPLASTY CERV/THOR BX INC UNI/BIL INC/INJECT/IMAGING 02/15/2016 Luanne Bras, MD [facility]  . IR GENERIC HISTORICAL  03/13/2016   IR KYPHO EA ADDL LEVEL THORACIC OR LUMBAR 03/13/2016 Luanne Bras, MD [facility]  . IR GENERIC HISTORICAL  03/13/2016   IR KYPHO EA ADDL LEVEL THORACIC OR LUMBAR  03/13/2016 Luanne Bras, MD [facility]  . IR GENERIC HISTORICAL  03/13/2016   IR BONE TUMOR(S)RF ABLATION 03/13/2016 Luanne Bras, MD [facility]  . IR GENERIC HISTORICAL  03/13/2016   IR KYPHO LUMBAR INC FX REDUCE BONE BX UNI/BIL CANNULATION INC/IMAGING 03/13/2016 Luanne Bras, MD [facility]  . IR GENERIC HISTORICAL  03/13/2016   IR BONE TUMOR(S)RF ABLATION 03/13/2016 Luanne Bras, MD [facility]  . IR GENERIC HISTORICAL  03/13/2016   IR BONE TUMOR(S)RF ABLATION 03/13/2016 Luanne Bras, MD [facility]  . IR GENERIC HISTORICAL  03/31/2016   IR RADIOLOGIST EVAL & MGMT 03/31/2016 [facility]  . LAPAROSCOPIC INGUINAL HERNIA REPAIR Bilateral 12-16-2013  dr gross  . RADIOLOGY WITH ANESTHESIA N/A 02/15/2016   Procedure: Spinal Ablation;  Surgeon: Luanne Bras, MD;  Location: [place];  Service: Radiology;  Laterality: N/A;  . RADIOLOGY WITH ANESTHESIA N/A 03/13/2016   Procedure: LUMBER ABLATION;  Surgeon: Luanne Bras, MD;  Location: [place] [place];  Service: Radiology;  Laterality: N/A;  . ROTATOR CUFF REPAIR Right 2003  . TONSILLECTOMY  age 61  . WISDOM TOOTH EXTRACTION      There were no vitals filed for this visit.      Subjective Assessment - 02/05/17 1704    Subjective Pt frustrated by inabiliity to walk due to metatarsal fracture.   Pertinent History T6 & T8 kypholoplasty on 11/21/16. Multiple myeloma with multiple vertebral compression fractures with kyphoplasties and vertbroloplasties. Stem cell tranplant on 08/22/16 at [facility]. He follows up regularly with Dr. Marin Olp. He is referred for OP PT for "eval & treat, therapeutic exercises: strengthing/conditioning, therapeutic activities: conditioning"   Limitations Sitting;Standing;Walking   Patient Stated Goals "gain more strength in legs and back"   Currently in Pain? Yes   Pain Score 2    Pain Location Back   Pain Orientation Right;Mid;Medial   Pain Descriptors / Indicators Aching            [facility] PT  Assessment - 02/05/17 1700      Observation/Other Assessments   Focus on Therapeutic Outcomes (FOTO)  36% (64% limitation)                     [facility] Adult PT Treatment/Exercise - 02/05/17 1700      Lumbar Exercises: Stretches   Hip Flexor Stretch 30 seconds;2 reps   Hip Flexor Stretch Limitations prone with hip extended (knee on folded pillow) + strap      Lumbar Exercises: Aerobic   UBE (Upper Arm Bike) lvl 2.0 fwd/back x 3' each     Lumbar Exercises: Supine   Clam 15 reps;3 seconds   Clam Limitations alt hip ABD/ER with black TB   Isometric Hip Flexion 15 reps;3 seconds   Isometric Hip Flexion Limitations feet resting on peanut ball   Other Supine Lumbar Exercises straight leg bridge with heels on orange ball 15x3"     Lumbar Exercises: Sidelying   Other Sidelying Lumbar Exercises B side plank elbow to knee 10x5"     Lumbar Exercises: Prone   Straight Leg Raise 10  reps;3 seconds   Other Prone Lumbar Exercises elbow plank 10x5" - distal shins resting on bolster to avoid pressure on R foot     Lumbar Exercises: Quadruped   Opposite Arm/Leg Raise Right arm/Left leg;Left arm/Right leg;10 reps;3 seconds   Other Quadruped Lumbar Exercises Prone rocking from child's pose into cobra pose 15x5"                PT Education - 02/05/17 1740    Education Details modification of hip flexor stretch due to limited tolerance for lunge stretch secondary R metatarsal fracture   Person(s) Educated Patient   Methods Explanation;Demonstration;Handout   Comprehension Verbalized understanding;Returned demonstration;Need further instruction          PT Short Term Goals - 01/05/17 1639      PT SHORT TERM GOAL #1   Title independent with initial HEP by 01/09/17   Status Achieved           PT Long Term Goals - 12/29/16 1711      PT LONG TERM GOAL #1   Title Independent with ongoing HEP +/- gym program to increase back extension & LE strength and decrease pain by  02/20/17   Status On-going     PT LONG TERM GOAL #2   Title B LE strength >/= 4+/5 for improved ease of mobility by 02/20/17   Status On-going     PT LONG TERM GOAL #3   Title Pt will tolerate standing in the kitchen for >/= 45 minutes w/o limitation due to back pain to allow for meal prep by 02/20/17   Status On-going     PT LONG TERM GOAL #4   Title Pt will be able to perform a deep squat with good body mechanics to allow safe retrieval of objects from floor by 02/20/17   Status On-going               Plan - 02/05/17 1705    Clinical Impression Statement Pt forgot to f/u with MD re: estim and/or dry needling clearance. Pain levels remain unchanged today. Pt remains limited with prone extension exercises due to continued hip flexor tightness and reporting limited tolerance for normal 1/2 kneel stretching due to metatarsal fracture, therefore instructed pt in modifications and alternative version of hip flexor/RF stretch. After stretching pt reporting imporved tolerance for prone exercises.   Rehab Potential Good   Clinical Impairments Affecting Rehab Potential T6 & T8 kypholoplasty on 11/21/16. Multiple myeloma with multiple vertebral compression fractures with kyphoplasties and vertbroloplasties. Stem cell transplant on 08/22/16 at [facility]. Regular ongoing f/u with oncologist, Dr. Marin Olp. Due to extensive vertebral involvement and compression fractures, will avoid all flexion based activity and lifting >10 pounds.    PT Treatment/Interventions Patient/family education;ADLs/Self Care Home Management;Energy conservation;Neuromuscular re-education;Therapeutic exercise;Therapeutic activities;Functional mobility training;Gait training;Balance training;Moist Heat;Cryotherapy;Manual techniques;Taping;Dry needling   PT Next Visit Plan LE flexibility including manual therapy and possible dry needling to address muscular tightness; core/lumbar stabilization/strengthening exercises; postural training; LE  strengthening - limit weightbearing/resistance/pressure through R foot   Consulted and Agree with Plan of Care Patient      Patient will benefit from skilled therapeutic intervention in order to improve the following deficits and impairments:  Pain, Impaired flexibility, Increased muscle spasms, Decreased range of motion, Decreased strength, Decreased endurance, Decreased activity tolerance, Decreased mobility, Postural dysfunction, Improper body mechanics, Impaired perceived functional ability, Decreased knowledge of precautions  Visit Diagnosis: Abnormal posture  Muscle weakness (generalized)  Pain in thoracic spine  Cramp and spasm  Problem List Patient Active Problem List   Diagnosis Date Noted  . Phlebitis or thrombophlebitis of lower extremity 07/21/2016  . Visit for preventive health examination 05/16/2016  . Aphthous ulcer 05/16/2016  . Ureterolithiasis 05/16/2016  . Acute deep vein thrombosis (DVT) of femoral vein of left lower extremity ([place]) 03/21/2016  . Multiple myeloma ([place]) 02/22/2016  . Multiple myeloma not having achieved remission ([place]) 02/22/2016  . LBP (low back pain) 11/02/2013  . Bilateral inguinal hernia (BIH), s/p lap repair 12/16/2013 11/02/2013    Percival Spanish, PT, MPT 02/05/2017, 6:10 PM  [facility] [address]  [place], [place], [address] Phone: [phone]   Fax:  [phone]  Name: Jorge [name] MRN: [id number] Date of Birth: 1955/09/25

## 2017-02-06 ENCOUNTER — Other Ambulatory Visit: Payer: 59

## 2017-02-12 ENCOUNTER — Ambulatory Visit: Payer: 59 | Attending: Pain Medicine | Admitting: Physical Therapy

## 2017-02-12 DIAGNOSIS — R293 Abnormal posture: Secondary | ICD-10-CM | POA: Insufficient documentation

## 2017-02-12 DIAGNOSIS — M546 Pain in thoracic spine: Secondary | ICD-10-CM | POA: Diagnosis not present

## 2017-02-12 DIAGNOSIS — R252 Cramp and spasm: Secondary | ICD-10-CM | POA: Diagnosis present

## 2017-02-12 DIAGNOSIS — M6281 Muscle weakness (generalized): Secondary | ICD-10-CM | POA: Diagnosis not present

## 2017-02-12 NOTE — Therapy (Signed)
[facility] [address]  [place], [place], [address] Phone: [phone]   Fax:  [phone]  Physical Therapy Treatment  Patient Details  Name: Jorge [name] MRN: [id number] Date of Birth: 1955/12/25 Referring Provider: Durenda Age, MD  Encounter Date: 02/12/2017      PT End of Session - 02/12/17 0849    Visit Number 11   Number of Visits 16   Date for PT Re-Evaluation 02/20/17   Authorization Type UHC   PT Start Time 0849   PT Stop Time 0931   PT Time Calculation (min) 42 min   Activity Tolerance Patient tolerated treatment well   Behavior During Therapy [facility] for tasks assessed/performed      Past Medical History:  Diagnosis Date  . Anxiety   . Bone metastasis ([place])   . Chest cold 05/19/2016   productive cough  -- started on antibiotic  . Chronic back pain    due to bone mets from myeloma  . Cough   . Depression   . GERD (gastroesophageal reflux disease)   . Hiatal hernia   . History of chicken pox   . History of concussion    age 33 -- no residual  . History of DVT of lower extremity 03/21/2016  treated and completed w/ xarelto   per doppler left extensive occlusion common femoral, femoral, and popliteal veins and right partial occlusion common femoral and profunda femoral veins/  last doppler 06-04-2016 no evidence acute or chronic dvt noted either leg   . History of radiation therapy 06/09/16-06/23/16   lower thoracic spine 25 Gy in 10 fractions  . Mouth ulcers    secondary to radiation  . Multiple myeloma ([place]) dx 02/22/2016 via bone marrow bx---  oncologist-  [name]   IgG Kappa-- Hyperdiploid/ +11 w/ bone mets--  current treatment chemotherapy (started 08/ 2017)and pallitive radiation to back started 06-09-2016  . Renal calculus, right   . Wears contact lenses     Past Surgical History:  Procedure Laterality Date  . COLONOSCOPY  [id number]  . CYSTOSCOPY W/ URETERAL STENT  PLACEMENT Right 06/20/2016   Procedure: CYSTOSCOPY WITH STENT REPLACEMENT;  Surgeon: Kathie Rhodes, MD;  Location: [facility];  Service: Urology;  Laterality: Right;  . CYSTOSCOPY WITH RETROGRADE PYELOGRAM, URETEROSCOPY AND STENT PLACEMENT Right 05/30/2016   Procedure: CYSTOSCOPY WITH RETROGRADE PYELOGRAM, URETEROSCOPY AND STENT PLACEMENT,DILITATION URETERAL STRICTURE;  Surgeon: Kathie Rhodes, MD;  Location: [facility];  Service: Urology;  Laterality: Right;  . CYSTOSCOPY/RETROGRADE/URETEROSCOPY/STONE EXTRACTION WITH BASKET Right 06/20/2016   Procedure: CYSTOSCOPY/URETEROSCOPY/STONE EXTRACTION WITH BASKET;  Surgeon: Kathie Rhodes, MD;  Location: [facility];  Service: Urology;  Laterality: Right;  . HOLMIUM LASER APPLICATION Right 74/07/6382   Procedure: HOLMIUM LASER APPLICATION;  Surgeon: Kathie Rhodes, MD;  Location: [facility];  Service: Urology;  Laterality: Right;  . IR GENERIC HISTORICAL  02/11/2016   IR RADIOLOGIST EVAL & MGMT 02/11/2016 [facility]  . IR GENERIC HISTORICAL  02/15/2016   IR BONE TUMOR(S)RF ABLATION 02/15/2016 Luanne Bras, MD [facility]  . IR GENERIC HISTORICAL  02/15/2016   IR BONE TUMOR(S)RF ABLATION 02/15/2016 Luanne Bras, MD [facility]  . IR GENERIC HISTORICAL  02/15/2016   IR BONE TUMOR(S)RF ABLATION 02/15/2016 Luanne Bras, MD [facility]  . IR GENERIC HISTORICAL  02/15/2016   IR KYPHO THORACIC WITH BONE BIOPSY 02/15/2016 Luanne Bras, MD [facility]  . IR GENERIC HISTORICAL  02/15/2016   IR KYPHO  THORACIC WITH BONE BIOPSY 02/15/2016 Luanne Bras, MD [facility]  . IR GENERIC HISTORICAL  02/15/2016   IR VERTEBROPLASTY CERV/THOR BX INC UNI/BIL INC/INJECT/IMAGING 02/15/2016 Luanne Bras, MD [facility]  . IR GENERIC HISTORICAL  03/13/2016   IR KYPHO EA ADDL LEVEL THORACIC OR LUMBAR 03/13/2016 Luanne Bras, MD [facility]  . IR GENERIC HISTORICAL  03/13/2016   IR KYPHO EA ADDL LEVEL THORACIC OR LUMBAR  03/13/2016 Luanne Bras, MD [facility]  . IR GENERIC HISTORICAL  03/13/2016   IR BONE TUMOR(S)RF ABLATION 03/13/2016 Luanne Bras, MD [facility]  . IR GENERIC HISTORICAL  03/13/2016   IR KYPHO LUMBAR INC FX REDUCE BONE BX UNI/BIL CANNULATION INC/IMAGING 03/13/2016 Luanne Bras, MD [facility]  . IR GENERIC HISTORICAL  03/13/2016   IR BONE TUMOR(S)RF ABLATION 03/13/2016 Luanne Bras, MD [facility]  . IR GENERIC HISTORICAL  03/13/2016   IR BONE TUMOR(S)RF ABLATION 03/13/2016 Luanne Bras, MD [facility]  . IR GENERIC HISTORICAL  03/31/2016   IR RADIOLOGIST EVAL & MGMT 03/31/2016 [facility]  . LAPAROSCOPIC INGUINAL HERNIA REPAIR Bilateral 12-16-2013  dr gross  . RADIOLOGY WITH ANESTHESIA N/A 02/15/2016   Procedure: Spinal Ablation;  Surgeon: Luanne Bras, MD;  Location: [place];  Service: Radiology;  Laterality: N/A;  . RADIOLOGY WITH ANESTHESIA N/A 03/13/2016   Procedure: LUMBER ABLATION;  Surgeon: Luanne Bras, MD;  Location: [facility];  Service: Radiology;  Laterality: N/A;  . ROTATOR CUFF REPAIR Right 2003  . TONSILLECTOMY  age 41  . WISDOM TOOTH EXTRACTION      There were no vitals filed for this visit.      Subjective Assessment - 02/12/17 0855    Subjective Pt reporting trying to swim with frog kick while at the beach and noted a "pop" in his L hip, with some pain initially, now resolved.  States pain has leveled out at ~2/10 but notes lessening of extremes when pain does flare up.   Pertinent History T6 & T8 kypholoplasty on 11/21/16. Multiple myeloma with multiple vertebral compression fractures with kyphoplasties and vertbroloplasties. Stem cell tranplant on 08/22/16 at [facility]. He follows up regularly with Dr. Marin Olp. He is referred for OP PT for "eval & treat, therapeutic exercises: strengthing/conditioning, therapeutic activities: conditioning"   Limitations Sitting;Standing;Walking   Patient Stated Goals "gain more strength in legs and back"    Currently in Pain? Yes   Pain Score 2    Pain Location Back   Pain Orientation Right;Mid;Medial   Pain Descriptors / Indicators Aching   Pain Type Chronic pain   Pain Frequency Constant                         [facility] Adult PT Treatment/Exercise - 02/12/17 0849      Lumbar Exercises: Aerobic   Stationary Bike NuStep - lvl 5 x 6 min (B UE/L LE)     Lumbar Exercises: Machines for Strengthening   Other Lumbar Machine Exercise BATCA pull down      Lumbar Exercises: Seated   Other Seated Lumbar Exercises B row & shoulder extension with black TB 15x3', B pallof press 15x3" with single blue TB seated on green (65 cm) Pball; cues to maintain neutral pelvis position     Knee/Hip Exercises: Stretches   Other Knee/Hip Stretches hip adductor stretches (frog position & supine with strap) 2x30" each   Other Knee/Hip Stretches hip IR sretches (supine & seated with strap) 2x30" each  PT Education - 02/12/17 0931    Education provided Yes   Education Details hip adductor & IR stretches; progressed rows with black TB for HEP   Person(s) Educated Patient   Methods Explanation;Demonstration;Handout   Comprehension Verbalized understanding;Returned demonstration;Need further instruction          PT Short Term Goals - 01/05/17 1639      PT SHORT TERM GOAL #1   Title independent with initial HEP by 01/09/17   Status Achieved           PT Long Term Goals - 12/29/16 1711      PT LONG TERM GOAL #1   Title Independent with ongoing HEP +/- gym program to increase back extension & LE strength and decrease pain by 02/20/17   Status On-going     PT LONG TERM GOAL #2   Title B LE strength >/= 4+/5 for improved ease of mobility by 02/20/17   Status On-going     PT LONG TERM GOAL #3   Title Pt will tolerate standing in the kitchen for >/= 45 minutes w/o limitation due to back pain to allow for meal prep by 02/20/17   Status On-going     PT LONG TERM GOAL  #4   Title Pt will be able to perform a deep squat with good body mechanics to allow safe retrieval of objects from floor by 02/20/17   Status On-going               Plan - 02/12/17 0931    Clinical Impression Statement Pt reports he has left a message for his MD regarding clearance for estim &/or DN, but no response yet. Will f/u at MD appt next week if no response by then. Pt noting normal pain level has settled at 2/10 but noting less severe extremes when pain flares up. Pt continues to tolerate progression of resistance with exercises, but otherwise remains limited due to restrictions from R metatarsal fracture.   Rehab Potential Good   Clinical Impairments Affecting Rehab Potential T6 & T8 kypholoplasty on 11/21/16. Multiple myeloma with multiple vertebral compression fractures with kyphoplasties and vertbroloplasties. Stem cell transplant on 08/22/16 at [facility]. Regular ongoing f/u with oncologist, Dr. Marin Olp. Due to extensive vertebral involvement and compression fractures, will avoid all flexion based activity and lifting >10 pounds.    PT Treatment/Interventions Patient/family education;ADLs/Self Care Home Management;Energy conservation;Neuromuscular re-education;Therapeutic exercise;Therapeutic activities;Functional mobility training;Gait training;Balance training;Moist Heat;Cryotherapy;Manual techniques;Taping;Dry needling   PT Next Visit Plan LE flexibility including manual therapy and possible dry needling to address muscular tightness; core/lumbar stabilization/strengthening exercises; postural training; LE strengthening - limit weightbearing/resistance/pressure through R foot   Consulted and Agree with Plan of Care Patient      Patient will benefit from skilled therapeutic intervention in order to improve the following deficits and impairments:  Pain, Impaired flexibility, Increased muscle spasms, Decreased range of motion, Decreased strength, Decreased endurance, Decreased activity  tolerance, Decreased mobility, Postural dysfunction, Improper body mechanics, Impaired perceived functional ability, Decreased knowledge of precautions  Visit Diagnosis: Abnormal posture  Muscle weakness (generalized)  Pain in thoracic spine  Cramp and spasm     Problem List Patient Active Problem List   Diagnosis Date Noted  . Phlebitis or thrombophlebitis of lower extremity 07/21/2016  . Visit for preventive health examination 05/16/2016  . Aphthous ulcer 05/16/2016  . Ureterolithiasis 05/16/2016  . Acute deep vein thrombosis (DVT) of femoral vein of left lower extremity ([place]) 03/21/2016  . Multiple myeloma ([place]) 02/22/2016  .  Multiple myeloma not having achieved remission ([place]) 02/22/2016  . LBP (low back pain) 11/02/2013  . Bilateral inguinal hernia (BIH), s/p lap repair 12/16/2013 11/02/2013    Percival Spanish, PT, MPT 02/12/2017, 12:01 PM  [facility] [address]  [place], [place], [address] Phone: [phone]   Fax:  [phone]  Name: Demontre Padin MRN: [id number] Date of Birth: 1955/09/20

## 2017-02-13 ENCOUNTER — Other Ambulatory Visit: Payer: 59

## 2017-02-16 ENCOUNTER — Other Ambulatory Visit: Payer: Self-pay | Admitting: *Deleted

## 2017-02-16 DIAGNOSIS — G8929 Other chronic pain: Secondary | ICD-10-CM

## 2017-02-16 DIAGNOSIS — M545 Low back pain, unspecified: Secondary | ICD-10-CM

## 2017-02-16 DIAGNOSIS — I8290 Acute embolism and thrombosis of unspecified vein: Secondary | ICD-10-CM

## 2017-02-16 DIAGNOSIS — D472 Monoclonal gammopathy: Secondary | ICD-10-CM

## 2017-02-16 DIAGNOSIS — M7989 Other specified soft tissue disorders: Secondary | ICD-10-CM

## 2017-02-16 DIAGNOSIS — C9000 Multiple myeloma not having achieved remission: Secondary | ICD-10-CM

## 2017-02-16 MED ORDER — LENALIDOMIDE 10 MG PO CAPS: ORAL_CAPSULE | ORAL | 0 refills | Status: DC

## 2017-02-16 MED ORDER — FENTANYL 25 MCG/HR TD PT72
25.0000 ug | MEDICATED_PATCH | TRANSDERMAL | 0 refills | Status: DC
Start: 2017-02-16 — End: 2017-04-07

## 2017-02-17 ENCOUNTER — Ambulatory Visit: Payer: 59 | Admitting: Physical Therapy

## 2017-02-17 DIAGNOSIS — M546 Pain in thoracic spine: Secondary | ICD-10-CM

## 2017-02-17 DIAGNOSIS — M6281 Muscle weakness (generalized): Secondary | ICD-10-CM

## 2017-02-17 DIAGNOSIS — R293 Abnormal posture: Secondary | ICD-10-CM

## 2017-02-17 DIAGNOSIS — R252 Cramp and spasm: Secondary | ICD-10-CM

## 2017-02-17 NOTE — Patient Instructions (Signed)

## 2017-02-17 NOTE — Therapy (Signed)
[facility] [address]  [place], [place], [address] Phone: [phone]   Fax:  [phone]  Physical Therapy Treatment  Patient Details  Name: [name] MRN: [id number] Date of Birth: 07/19/1955 Referring Provider: Durenda Age, MD  Encounter Date: 02/17/2017      PT End of Session - 02/17/17 1031    Visit Number 12   Number of Visits 16   Date for PT Re-Evaluation 02/20/17   Authorization Type UHC   PT Start Time 1532   PT Stop Time 1624   PT Time Calculation (min) 52 min   Activity Tolerance Patient tolerated treatment well   Behavior During Therapy [facility] for tasks assessed/performed      Past Medical History:  Diagnosis Date  . Anxiety   . Bone metastasis ([place])   . Chest cold 05/19/2016   productive cough  -- started on antibiotic  . Chronic back pain    due to bone mets from myeloma  . Cough   . Depression   . GERD (gastroesophageal reflux disease)   . Hiatal hernia   . History of chicken pox   . History of concussion    age 61 -- no residual  . History of DVT of lower extremity 03/21/2016  treated and completed w/ xarelto   per doppler left extensive occlusion common femoral, femoral, and popliteal veins and right partial occlusion common femoral and profunda femoral veins/  last doppler 06-04-2016 no evidence acute or chronic dvt noted either leg   . History of radiation therapy 06/09/16-06/23/16   lower thoracic spine 25 Gy in 10 fractions  . Mouth ulcers    secondary to radiation  . Multiple myeloma ([place]) dx 02/22/2016 via bone marrow bx---  oncologist-  [name]   IgG Kappa-- Hyperdiploid/ +11 w/ bone mets--  current treatment chemotherapy (started 08/ 2017)and pallitive radiation to back started 06-09-2016  . Renal calculus, right   . Wears contact lenses     Past Surgical History:  Procedure Laterality Date  . COLONOSCOPY  [id number]  . CYSTOSCOPY W/ URETERAL STENT  PLACEMENT Right 06/20/2016   Procedure: CYSTOSCOPY WITH STENT REPLACEMENT;  Surgeon: Kathie Rhodes, MD;  Location: [facility];  Service: Urology;  Laterality: Right;  . CYSTOSCOPY WITH RETROGRADE PYELOGRAM, URETEROSCOPY AND STENT PLACEMENT Right 05/30/2016   Procedure: CYSTOSCOPY WITH RETROGRADE PYELOGRAM, URETEROSCOPY AND STENT PLACEMENT,DILITATION URETERAL STRICTURE;  Surgeon: Kathie Rhodes, MD;  Location: [facility];  Service: Urology;  Laterality: Right;  . CYSTOSCOPY/RETROGRADE/URETEROSCOPY/STONE EXTRACTION WITH BASKET Right 06/20/2016   Procedure: CYSTOSCOPY/URETEROSCOPY/STONE EXTRACTION WITH BASKET;  Surgeon: Kathie Rhodes, MD;  Location: [facility];  Service: Urology;  Laterality: Right;  . HOLMIUM LASER APPLICATION Right 59/10/5857   Procedure: HOLMIUM LASER APPLICATION;  Surgeon: Kathie Rhodes, MD;  Location: [facility];  Service: Urology;  Laterality: Right;  . IR GENERIC HISTORICAL  02/11/2016   IR RADIOLOGIST EVAL & MGMT 02/11/2016 [facility]  . IR GENERIC HISTORICAL  02/15/2016   IR BONE TUMOR(S)RF ABLATION 02/15/2016 Luanne Bras, MD [facility]  . IR GENERIC HISTORICAL  02/15/2016   IR BONE TUMOR(S)RF ABLATION 02/15/2016 Luanne Bras, MD [facility]  . IR GENERIC HISTORICAL  02/15/2016   IR BONE TUMOR(S)RF ABLATION 02/15/2016 Luanne Bras, MD [facility]  . IR GENERIC HISTORICAL  02/15/2016   IR KYPHO THORACIC WITH BONE BIOPSY 02/15/2016 Luanne Bras, MD [facility]  . IR GENERIC HISTORICAL  02/15/2016   IR KYPHO  THORACIC WITH BONE BIOPSY 02/15/2016 Luanne Bras, MD [facility]  . IR GENERIC HISTORICAL  02/15/2016   IR VERTEBROPLASTY CERV/THOR BX INC UNI/BIL INC/INJECT/IMAGING 02/15/2016 Luanne Bras, MD [facility]  . IR GENERIC HISTORICAL  03/13/2016   IR KYPHO EA ADDL LEVEL THORACIC OR LUMBAR 03/13/2016 Luanne Bras, MD [facility]  . IR GENERIC HISTORICAL  03/13/2016   IR KYPHO EA ADDL LEVEL THORACIC OR LUMBAR  03/13/2016 Luanne Bras, MD [facility]  . IR GENERIC HISTORICAL  03/13/2016   IR BONE TUMOR(S)RF ABLATION 03/13/2016 Luanne Bras, MD [facility]  . IR GENERIC HISTORICAL  03/13/2016   IR KYPHO LUMBAR INC FX REDUCE BONE BX UNI/BIL CANNULATION INC/IMAGING 03/13/2016 Luanne Bras, MD [facility]  . IR GENERIC HISTORICAL  03/13/2016   IR BONE TUMOR(S)RF ABLATION 03/13/2016 Luanne Bras, MD [facility]  . IR GENERIC HISTORICAL  03/13/2016   IR BONE TUMOR(S)RF ABLATION 03/13/2016 Luanne Bras, MD [facility]  . IR GENERIC HISTORICAL  03/31/2016   IR RADIOLOGIST EVAL & MGMT 03/31/2016 [facility]  . LAPAROSCOPIC INGUINAL HERNIA REPAIR Bilateral 12-16-2013  dr gross  . RADIOLOGY WITH ANESTHESIA N/A 02/15/2016   Procedure: Spinal Ablation;  Surgeon: Luanne Bras, MD;  Location: [facility];  Service: Radiology;  Laterality: N/A;  . RADIOLOGY WITH ANESTHESIA N/A 03/13/2016   Procedure: LUMBER ABLATION;  Surgeon: Luanne Bras, MD;  Location: [facility];  Service: Radiology;  Laterality: N/A;  . ROTATOR CUFF REPAIR Right 2003  . TONSILLECTOMY  age 55  . WISDOM TOOTH EXTRACTION      There were no vitals filed for this visit.      Subjective Assessment - 02/17/17 1536    Subjective Called received from [place] for Dr. Michelene Heady approving use of estim and/or DN for increased muscle tension and pain relief.   Pertinent History T6 & T8 kypholoplasty on 11/21/16. Multiple myeloma with multiple vertebral compression fractures with kyphoplasties and vertbroloplasties. Stem cell tranplant on 08/22/16 at [facility]. He follows up regularly with Dr. Marin Olp. He is referred for OP PT for "eval & treat, therapeutic exercises: strengthing/conditioning, therapeutic activities: conditioning"   Limitations Sitting;Standing;Walking   Patient Stated Goals "gain more strength in legs and back"   Currently in Pain? Yes   Pain Score 2   ranges from 1-3/10 typically   Pain Location Back   Pain  Orientation Right;Mid;Medial   Pain Descriptors / Indicators Aching   Pain Type Chronic pain   Pain Radiating Towards occasional/rare pain wrapping around ribs bilaterally, R > L   Pain Onset More than a month ago   Pain Frequency Constant   Aggravating Factors  prolonged standing, leaning forward   Pain Relieving Factors rest   Effect of Pain on Daily Activities able to work around the pain most days                         [facility] Adult PT Treatment/Exercise - 02/17/17 1532      Lumbar Exercises: Aerobic   UBE (Upper Arm Bike) lvl 2.5 fwd/back x 3' each     Lumbar Exercises: Standing   Row Both;15 reps;Theraband;Strengthening   Theraband Level (Row) Level 4 (Blue)   Shoulder Extension Both;15 reps;Theraband;Strengthening   Theraband Level (Shoulder Extension) Level 4 (Blue)     Shoulder Exercises: Prone   Extension Both;10 reps   Extension Limitations "I's" - leaning over green Pball from knees resting on mat table   Horizontal ABduction 1 Both;10 reps  Horizontal ABduction 1 Limitations "T's" - leaning over green Pball from knees resting on mat table   Horizontal ABduction 2 Both;10 reps   Horizontal ABduction 2 Limitations "Y's" - leaning over green Pball from knees resting on mat table     Modalities   Modalities Electrical Stimulation;Cryotherapy     Cryotherapy   Number Minutes Cryotherapy 10 Minutes   Cryotherapy Location --  thoracic spine   Type of Cryotherapy Ice pack     Electrical Stimulation   Electrical Stimulation Location thoracic paraspinals   Electrical Stimulation Action IFC   Electrical Stimulation Parameters 80-150 Hz, intensity to pt tol x10'   Electrical Stimulation Goals Pain;Tone     Manual Therapy   Manual Therapy Soft tissue mobilization   Soft tissue mobilization L lower thoracic paraspinals          Trigger Point Dry Needling - 02/17/17 1532    Consent Given? Yes   Education Handout Provided Yes   Muscles Treated  Upper Body Longissimus  thoracic   Longissimus Response Twitch response elicited;Palpable increased muscle length                PT Short Term Goals - 01/05/17 1639      PT SHORT TERM GOAL #1   Title independent with initial HEP by 01/09/17   Status Achieved           PT Long Term Goals - 12/29/16 1711      PT LONG TERM GOAL #1   Title Independent with ongoing HEP +/- gym program to increase back extension & LE strength and decrease pain by 02/20/17   Status On-going     PT LONG TERM GOAL #2   Title B LE strength >/= 4+/5 for improved ease of mobility by 02/20/17   Status On-going     PT LONG TERM GOAL #3   Title Pt will tolerate standing in the kitchen for >/= 45 minutes w/o limitation due to back pain to allow for meal prep by 02/20/17   Status On-going     PT LONG TERM GOAL #4   Title Pt will be able to perform a deep squat with good body mechanics to allow safe retrieval of objects from floor by 02/20/17   Status On-going               Plan - 02/17/17 1541    Clinical Impression Statement Clearance received from [place] for Dr. Michelene Heady for use of DN and/or estim for increased muscle tension and pain relief. Pt with persistent increased muscle tension noted in thoracic paraspinals, esp longissimus L>R, therefore intiated trial of DN after pt provided education on dry needling treatment including lung field precautions and pt consent obtained. Strong twitch response elicited with decreased muscle tension noted following DN. DN needling followed by thoracic muscle strengthening and treatment concluded with estim and ice pack per pt preference. If postive response noted from estim, will look into insurance coverage for home TENS/IT unit.   Rehab Potential Good   Clinical Impairments Affecting Rehab Potential T6 & T8 kypholoplasty on 11/21/16. Multiple myeloma with multiple vertebral compression fractures with kyphoplasties and vertbroplasties. Stem cell  transplant on 08/22/16 at [facility]. Regular ongoing f/u with oncologist, Dr. Marin Olp. Due to extensive vertebral involvement and compression fractures, will avoid all flexion based activity and lifting >10 pounds.    PT Treatment/Interventions Patient/family education;ADLs/Self Care Home Management;Energy conservation;Neuromuscular re-education;Therapeutic exercise;Therapeutic activities;Functional mobility training;Gait training;Balance training;Moist Heat;Cryotherapy;Manual techniques;Taping;Dry needling   PT Next  Visit Plan assess response to DN & estim; LE flexibility including manual therapy and possible dry needling to address muscular tightness; core/lumbar stabilization/strengthening exercises; postural training; LE strengthening - limit weightbearing/resistance/pressure through R foot   Consulted and Agree with Plan of Care Patient      Patient will benefit from skilled therapeutic intervention in order to improve the following deficits and impairments:  Pain, Impaired flexibility, Increased muscle spasms, Decreased range of motion, Decreased strength, Decreased endurance, Decreased activity tolerance, Decreased mobility, Postural dysfunction, Improper body mechanics, Impaired perceived functional ability, Decreased knowledge of precautions  Visit Diagnosis: Abnormal posture  Muscle weakness (generalized)  Pain in thoracic spine  Cramp and spasm     Problem List Patient Active Problem List   Diagnosis Date Noted  . Phlebitis or thrombophlebitis of lower extremity 07/21/2016  . Visit for preventive health examination 05/16/2016  . Aphthous ulcer 05/16/2016  . Ureterolithiasis 05/16/2016  . Acute deep vein thrombosis (DVT) of femoral vein of left lower extremity ([place]) 03/21/2016  . Multiple myeloma ([place]) 02/22/2016  . Multiple myeloma not having achieved remission ([place]) 02/22/2016  . LBP (low back pain) 11/02/2013  . Bilateral inguinal hernia (BI[facility]), s/p lap repair 12/16/2013 11/02/2013     Percival Spanish, PT, MPT 02/17/2017, 7:53 PM  [facility] [address]  [place], [place], [address] Phone: [phone]   Fax:  [phone]  Name: Jorge [name] MRN: [id number] Date of Birth: June 04, 1956

## 2017-02-18 ENCOUNTER — Ambulatory Visit (INDEPENDENT_AMBULATORY_CARE_PROVIDER_SITE_OTHER): Payer: 59 | Admitting: Family Medicine

## 2017-02-18 DIAGNOSIS — M216X2 Other acquired deformities of left foot: Secondary | ICD-10-CM

## 2017-02-18 DIAGNOSIS — C9000 Multiple myeloma not having achieved remission: Secondary | ICD-10-CM | POA: Diagnosis not present

## 2017-02-18 DIAGNOSIS — M7742 Metatarsalgia, left foot: Secondary | ICD-10-CM | POA: Diagnosis not present

## 2017-02-18 DIAGNOSIS — M216X1 Other acquired deformities of right foot: Secondary | ICD-10-CM | POA: Diagnosis not present

## 2017-02-18 DIAGNOSIS — Z9481 Bone marrow transplant status: Secondary | ICD-10-CM | POA: Diagnosis not present

## 2017-02-18 DIAGNOSIS — Z029 Encounter for administrative examinations, unspecified: Secondary | ICD-10-CM | POA: Diagnosis not present

## 2017-02-18 NOTE — Progress Notes (Signed)
Chief complaint: Follow-up of right foot pain 3 weeks  History of present illness: Jorge [name] is a 61 year old male, with past medical history notable for multiple myeloma diagnosed 1 year ago, history of DVT of left lower extremity on several toe, multiple vertebral compression fractures, status post kyphoplasty, who presents to the sports medicine office today for follow-up of right foot pain. He was seen here in the office by me back on 01/28/17 for right foot pain. Her sound the right foot was performed at last office visit, there were some hypoechoic changes and effusion noted on the distal aspect of second metatarsal near metatarsal head that seems suspicious for stress reaction versus early stress fracture. He was placed short boot. Today, he does report of interval improvement in symptoms today. He does not report of any pain today, does not report of any swelling. He reports that he has mainly been in his postoperative shoe. He reports that he has done short trials without the postoperative shoe and has not had any pain. He has not done any walking. He reports in the interval starting to develop left plantar foot pain over the second and third metatarsal head. He describes the pain as a "sock being rolled up underneath my foot" as well as "a pebble underneath my foot". He does not report of any radiation of pain. He does not report of any numbness, tingling, or burning sensation. He does not report of any nighttime symptoms as well as pain first thing in the morning or when he steps out of bed first thing in the morning. He does not report of any warmth, erythema, ecchymosis, or effusion. He reports that he just bought new walking shoes. Does not report of any injury. He reports for pain just using the fentanyl patch that has been prescribed by his pain physician.  Review of systems:  As stated above  Physical exam: Vital signs are reviewed and are documented in the chart Gen.: Alert, oriented,  appears stated age, in no apparent distress HEENT: Moist oral mucosa Respiratory: Normal respirations, able to speak in full sentences Cardiac: Regular rate, distal pulses 2+ Integumentary: No rashes on visible skin:  Neurologic: Strength 5/5 bilateral lower extremity, sensation 2+ in bilateral lower extremity Psych: Normal affect, mood is described as good Musculoskeletal: Inspection her right foot reveals no obvious deformity or muscular atrophy, he does have pes cavus as well as a flattened transverse arch, no warmth, erythema, ecchymosis, or effusion noted, he is not tender to palpation today over the second metatarsal head, both on dorsal aspect and on plantar aspect, no pain to palpation over Lisfranc, and navicular, and base of fifth metatarsal, he has normal range of motion in his right ankle, inspection of left foot reveals that he does have his cavus as well as flattened transverse arch as well, but otherwise no obvious deformity or muscular atrophy, no warmth, erythema, ecchymosis, or effusion noted, no tenderness to palpation over dorsal and plantar aspect of second and third metatarsal metatarsal head, does have normal ankle range of motion  Limited ultrasound was done of right foot today, evaluation of right second metatarsal on lateral axis views shows no cortical deformity seen along second metatarsal, no hypoechoic changes seen along second metatarsal and metatarsal head, same as said along short axis view, effectively ruling out stress fracture.  Impression: Normal appearance of right second metatarsal and right second metatarsal head  Assessment and plan: 1. Right foot pain, most likely stress reaction, no evidence of stress fracture  seen today  2. Left foot pain, suspicion of metatarsalgia versus atypical presentation of Morton's neuroma 3. Bilateral pes cavus and bilateral flattened transverse arch 4. History of multiple myeloma, currently in remission 5. History of left lower  extremity DVT, on anticoagulation   Right foot pain -He does have interval improvement in symptoms today, given no tenderness to palpation as well as ultrasound findings that are reassuring nor cortical defects or hypoechoic changes, discussed with patient he does not have stress fracture, most likely does have stress reaction -Discussed weaning out postoperative shoe, discussed slowly gradually working up to his walking distance that he does recreationally  Left foot pain -Discussed he does have a component of metatarsalgia, could also be an atypical presentation of Morton's neuroma, discussed he does have bilateral pes cavus, as well as flattened transverse arch, discussed that this could be contributing to his left foot pain, discussed will fit him in green sport orthotics, as well as scaphoid patting bilaterally, as well as metatarsal padding in his left shoe  Will have patient return in 3 weeks for custom orthotics, otherwise, he will return sooner on as-needed basis.  Mort Sawyers, M.D. [place]

## 2017-02-20 ENCOUNTER — Other Ambulatory Visit: Payer: 59

## 2017-02-23 ENCOUNTER — Ambulatory Visit (HOSPITAL_BASED_OUTPATIENT_CLINIC_OR_DEPARTMENT_OTHER): Payer: 59 | Admitting: Family

## 2017-02-23 ENCOUNTER — Other Ambulatory Visit (HOSPITAL_BASED_OUTPATIENT_CLINIC_OR_DEPARTMENT_OTHER): Payer: 59

## 2017-02-23 ENCOUNTER — Telehealth: Payer: Self-pay | Admitting: *Deleted

## 2017-02-23 ENCOUNTER — Other Ambulatory Visit: Payer: Self-pay | Admitting: *Deleted

## 2017-02-23 VITALS — BP 103/58 | HR 84 | Temp 98.8°F | Resp 18

## 2017-02-23 DIAGNOSIS — M79671 Pain in right foot: Secondary | ICD-10-CM | POA: Diagnosis not present

## 2017-02-23 DIAGNOSIS — C9000 Multiple myeloma not having achieved remission: Secondary | ICD-10-CM

## 2017-02-23 DIAGNOSIS — D500 Iron deficiency anemia secondary to blood loss (chronic): Secondary | ICD-10-CM

## 2017-02-23 DIAGNOSIS — M10071 Idiopathic gout, right ankle and foot: Secondary | ICD-10-CM

## 2017-02-23 LAB — CBC WITH DIFFERENTIAL (CANCER CENTER ONLY)
BASO#: 0 10*3/uL (ref 0.0–0.2)
BASO%: 0.6 % (ref 0.0–2.0)
EOS%: 5.5 % (ref 0.0–7.0)
Eosinophils Absolute: 0.2 10*3/uL (ref 0.0–0.5)
HCT: 30 % — ABNORMAL LOW (ref 38.7–49.9)
HGB: 9.6 g/dL — ABNORMAL LOW (ref 13.0–17.1)
LYMPH#: 0.9 10*3/uL (ref 0.9–3.3)
LYMPH%: 24.5 % (ref 14.0–48.0)
MCH: 27.4 pg — ABNORMAL LOW (ref 28.0–33.4)
MCHC: 32 g/dL (ref 32.0–35.9)
MCV: 86 fL (ref 82–98)
MONO#: 0.7 10*3/uL (ref 0.1–0.9)
MONO%: 19.9 % — ABNORMAL HIGH (ref 0.0–13.0)
NEUT%: 49.5 % (ref 40.0–80.0)
NEUTROS ABS: 1.7 10*3/uL (ref 1.5–6.5)
PLATELETS: 150 10*3/uL (ref 145–400)
RBC: 3.5 10*6/uL — AB (ref 4.20–5.70)
RDW: 17.5 % — ABNORMAL HIGH (ref 11.1–15.7)
WBC: 3.5 10*3/uL — AB (ref 4.0–10.0)

## 2017-02-23 LAB — BASIC METABOLIC PANEL - CANCER CENTER ONLY
BUN, Bld: 13 mg/dL (ref 7–22)
CHLORIDE: 106 meq/L (ref 98–108)
CO2: 26 mEq/L (ref 18–33)
Calcium: 8.5 mg/dL (ref 8.0–10.3)
Creat: 1.2 mg/dl (ref 0.6–1.2)
Glucose, Bld: 94 mg/dL (ref 73–118)
POTASSIUM: 3.7 meq/L (ref 3.3–4.7)
SODIUM: 135 meq/L (ref 128–145)

## 2017-02-23 LAB — URIC ACID (CC13): Uric Acid: 5 mg/dL (ref 3.7–8.6)

## 2017-02-23 MED ORDER — COLCHICINE 0.6 MG PO TABS: 0.6000 mg | ORAL_TABLET | Freq: Every day | ORAL | 0 refills | Status: DC

## 2017-02-23 NOTE — Telephone Encounter (Signed)
PAtient called this morning stating that his right toe is red and swollen and painful to the touch.  Joint is both reddened and swollen.  Dr. Marin Olp notified.  PAtient to see Laverna Peace NP today.

## 2017-02-23 NOTE — Progress Notes (Signed)
Hematology and Oncology Follow Up Visit  Jorge [name] [id number] 04-04-1956 61 y.o. 02/23/2017   Principle Diagnosis:  IgG Kappa myeloma - Hyperdiploid/+11 DVT of the LEFT and RIGHT leg   Current Therapy:   Revlimid 10 mg po q [name] Zometa 4 mg IV q 3 months - next dose is 02/2017 Xarelto 10 mg PO daily  Past Therapy:  Autologous stem cell transplant at [facility] - infused on 08/22/2016   Interim History:  Jorge [name] is here today for an unscheduled visit for gout. He has redness and pain at the first metatarsophalangeal joint of the right foot.  Uric acid level is pending. Kidney function testing is stable.  He did have a couple beers with friends over the weekend.  Hgb is 9.6. He states that he had several dark tarry stools last week but that this has resolved. No other episodes of bleeding, no petechiae or bruising.  No numbness or tingling in his extremities.  He received his pneumonia vaccine last week when he saw Dr. Laverta Baltimore at [facility]. He had a low grade fever, general malaise and loss of appetite right after. He is now feeling much better. He follows up with his team at [facility] again in 6 months.  No other fever, no chills, n/v, cough, rash, dizziness, SOB, chest pain, palpitations, abdominal pain or changes in bowel or bladder habits.  His appetite is improving and he is staying well hydrated. His weight is stable.   ECOG Performance Status: 1 - Symptomatic but completely ambulatory  Medications:  Allergies as of 02/23/2017   No Known Allergies     Medication List       Accurate as of 02/23/17  2:24 PM. Always use your most recent med list.          calcium carbonate 500 MG chewable tablet Commonly known as:  TUMS - dosed in mg elemental calcium Chew 1 tablet by mouth as needed for indigestion or heartburn.   carisoprodol 350 MG tablet Commonly known as:  SOMA Take 1 tablet (350 mg total) by mouth 4 (four) times daily as needed for muscle spasms.   cyclobenzaprine 10 MG  tablet Commonly known as:  FLEXERIL Take 10 mg by mouth daily as needed for muscle spasms.   famciclovir 500 MG tablet Commonly known as:  FAMVIR Take 1 tablet (500 mg total) by mouth daily.   fentaNYL 25 MCG/HR patch Commonly known as:  DURAGESIC - dosed mcg/hr Place 1 patch (25 mcg total) onto the skin every other [name].   HYDROcodone-acetaminophen 10-325 MG tablet Commonly known as:  NORCO Take 0.5-1 tablets by mouth every 8 (eight) hours as needed for moderate pain.   lenalidomide 10 MG capsule Commonly known as:  REVLIMID TAKE 1 CAPSULE BY MOUTH EVERY [name] FOR 21 DAYS, THEN 7 DAYS OFF. [id number]   lidocaine 5 % Commonly known as:  LIDODERM PLAEC 1 PATCH ONTO THE SKIN DAILY.APPLY 1 PATCH TO THE MOST PAINFUL AREA FOR 12 HR IN A 24 HR PERIOD   magnesium oxide 400 MG tablet Commonly known as:  MAG-OX Take 400 mg by mouth daily.   ondansetron 8 MG disintegrating tablet Commonly known as:  ZOFRAN-ODT Take 1 tablet (8 mg total) by mouth every 8 (eight) hours as needed.   Oxycodone HCl 10 MG Tabs Take 1 tablet (10 mg total) by mouth every 4 (four) hours as needed.   polyethylene glycol powder powder Commonly known as:  MIRALAX Take 17 g by mouth daily.   PROBIOTIC  PO Take 1 capsule by mouth daily.   ranitidine 150 MG tablet Commonly known as:  ZANTAC Take 150 mg by mouth as needed for heartburn.   rivaroxaban 10 MG Tabs tablet Commonly known as:  XARELTO Take 1 tablet (10 mg total) by mouth daily with supper.   rOPINIRole 0.25 MG tablet Commonly known as:  REQUIP Take one tablet 1-3hours before bed for two days and then increase to two tablets 1-3hours before bed daily.   senna 8.6 MG Tabs tablet Commonly known as:  SENOKOT Take 2 tablets (17.2 mg total) by mouth daily.   sertraline 100 MG tablet Commonly known as:  ZOLOFT Take 100 mg by mouth at bedtime.   Vitamin D-3 5000 units Tabs Take 5,000 mg by mouth daily.   zolpidem 10 MG tablet Commonly known  as:  AMBIEN Take 1 tablet (10 mg total) by mouth at bedtime as needed.       Allergies: No Known Allergies  Past Medical History, Surgical history, Social history, and Family History were reviewed and updated.  Review of Systems: All other 10 point review of systems is negative.   Physical Exam:  vitals were not taken for this visit.  Wt Readings from Last 3 Encounters:  02/18/17 160 lb (72.6 kg)  01/28/17 160 lb (72.6 kg)  01/09/17 162 lb (73.5 kg)    Ocular: Sclerae unicteric, pupils equal, round and reactive to light Ear-nose-throat: Oropharynx clear, dentition fair Lymphatic: No cervical, supraclavicular or axillary adenopathy Lungs no rales or rhonchi, good excursion bilaterally Heart regular rate and rhythm, no murmur appreciated Abd soft, nontender, positive bowel sounds, no liver or spleen tip palpated on exam, no fluid wave MSK no focal spinal tenderness, no joint edema Neuro: non-focal, well-oriented, appropriate affect Breasts: Deferred   Lab Results  Component Value Date   WBC 3.5 (L) 02/23/2017   HGB 9.6 (L) 02/23/2017   HCT 30.0 (L) 02/23/2017   MCV 86 02/23/2017   PLT 150 02/23/2017   No results found for: FERRITIN, IRON, TIBC, UIBC, IRONPCTSAT Lab Results  Component Value Date   RBC 3.50 (L) 02/23/2017   Lab Results  Component Value Date   KAPLAMBRATIO 1.90 (H) 01/05/2017   Lab Results  Component Value Date   IGGSERUM 544 (L) 01/05/2017   IGMSERUM 43 01/05/2017   Lab Results  Component Value Date   MSPIKE 0.5 (H) 07/28/2016     Chemistry      Component Value Date/Time   NA 140 02/02/2017 1440   NA 140 10/03/2016 0845   K 3.9 02/02/2017 1440   K 4.3 10/03/2016 0845   CL 105 02/02/2017 1440   CO2 30 02/02/2017 1440   CO2 26 10/03/2016 0845   BUN 14 02/02/2017 1440   BUN 11.9 10/03/2016 0845   CREATININE 1.1 02/02/2017 1440   CREATININE 0.9 10/03/2016 0845      Component Value Date/Time   CALCIUM 8.8 02/02/2017 1440   CALCIUM 9.9  10/03/2016 0845   ALKPHOS 53 02/02/2017 1440   ALKPHOS 80 10/03/2016 0845   AST 34 02/02/2017 1440   AST 28 10/03/2016 0845   ALT 36 02/02/2017 1440   ALT 26 10/03/2016 0845   BILITOT 0.70 02/02/2017 1440   BILITOT 0.48 10/03/2016 0845      Impression and Plan: Mr. Sunde is a very pleasant 61 yo caucasian gentleman with IgG kappa myeloma and autologous stem cell transplant in February 2018. He is here today for c/o pain and redness in his right  first metatarsophalangeal joint. This appears to be gout. I spoke with Dr. Marin Olp and we will have him start colchicine 0.6 mg PO TID for the first [name] and then 0.6 mg PO daily for the next 6 days. He verbalized understanding and will pick up his prescription at [facility] in [place] today.  We will plan to see him back again at his next infusion appointment next week for Zometa and shingles booster.  He will contact our office if his symptoms persist or worsen and will go the ED in the event of an emergency. We can ceratinly see him sooner if need be.   Eliezer Bottom, NP 8/13/20182:24 PM

## 2017-02-24 ENCOUNTER — Other Ambulatory Visit: Payer: Self-pay | Admitting: Hematology & Oncology

## 2017-02-24 ENCOUNTER — Ambulatory Visit: Payer: 59 | Admitting: Physical Therapy

## 2017-02-24 DIAGNOSIS — G8929 Other chronic pain: Secondary | ICD-10-CM

## 2017-02-24 DIAGNOSIS — M7989 Other specified soft tissue disorders: Secondary | ICD-10-CM

## 2017-02-24 DIAGNOSIS — I8290 Acute embolism and thrombosis of unspecified vein: Secondary | ICD-10-CM

## 2017-02-24 DIAGNOSIS — M545 Low back pain, unspecified: Secondary | ICD-10-CM

## 2017-02-24 DIAGNOSIS — D472 Monoclonal gammopathy: Secondary | ICD-10-CM

## 2017-02-24 DIAGNOSIS — C9000 Multiple myeloma not having achieved remission: Secondary | ICD-10-CM

## 2017-02-26 ENCOUNTER — Other Ambulatory Visit: Payer: Self-pay | Admitting: Hematology & Oncology

## 2017-02-26 ENCOUNTER — Ambulatory Visit (HOSPITAL_BASED_OUTPATIENT_CLINIC_OR_DEPARTMENT_OTHER): Payer: 59

## 2017-02-26 ENCOUNTER — Ambulatory Visit: Payer: 59 | Admitting: Physical Therapy

## 2017-02-26 ENCOUNTER — Other Ambulatory Visit: Payer: Self-pay | Admitting: Family

## 2017-02-26 ENCOUNTER — Ambulatory Visit (HOSPITAL_BASED_OUTPATIENT_CLINIC_OR_DEPARTMENT_OTHER)
Admission: RE | Admit: 2017-02-26 | Discharge: 2017-02-26 | Disposition: A | Discharge status: Home / Self Care | Payer: 59 | Source: Ambulatory Visit | Attending: Family | Admitting: Family

## 2017-02-26 ENCOUNTER — Encounter (HOSPITAL_BASED_OUTPATIENT_CLINIC_OR_DEPARTMENT_OTHER): Payer: Self-pay

## 2017-02-26 VITALS — BP 99/61 | HR 86 | Temp 98.4°F | Resp 18

## 2017-02-26 DIAGNOSIS — M79674 Pain in right toe(s): Secondary | ICD-10-CM

## 2017-02-26 DIAGNOSIS — M79671 Pain in right foot: Secondary | ICD-10-CM | POA: Diagnosis not present

## 2017-02-26 DIAGNOSIS — I8290 Acute embolism and thrombosis of unspecified vein: Secondary | ICD-10-CM

## 2017-02-26 DIAGNOSIS — M7989 Other specified soft tissue disorders: Secondary | ICD-10-CM

## 2017-02-26 DIAGNOSIS — C9000 Multiple myeloma not having achieved remission: Secondary | ICD-10-CM

## 2017-02-26 DIAGNOSIS — D472 Monoclonal gammopathy: Secondary | ICD-10-CM

## 2017-02-26 DIAGNOSIS — G8929 Other chronic pain: Secondary | ICD-10-CM

## 2017-02-26 DIAGNOSIS — L03115 Cellulitis of right lower limb: Secondary | ICD-10-CM | POA: Diagnosis not present

## 2017-02-26 DIAGNOSIS — I872 Venous insufficiency (chronic) (peripheral): Secondary | ICD-10-CM

## 2017-02-26 DIAGNOSIS — M545 Low back pain: Secondary | ICD-10-CM

## 2017-02-26 DIAGNOSIS — L03119 Cellulitis of unspecified part of limb: Secondary | ICD-10-CM

## 2017-02-26 MED ORDER — IOPAMIDOL (ISOVUE-300) INJECTION 61%
100.0000 mL | Freq: Once | INTRAVENOUS | Status: AC | PRN
  Administered 2017-02-26: 100 mL via INTRAVENOUS

## 2017-02-26 MED ORDER — SODIUM CHLORIDE 0.9 % IV SOLN
6.1000 mg/kg | INTRAVENOUS | Status: DC
  Administered 2017-02-26: 443 mg via INTRAVENOUS
  Filled 2017-02-26: qty 8.86

## 2017-02-26 MED ORDER — SODIUM CHLORIDE 0.9 % IV SOLN
Freq: Once | INTRAVENOUS | Status: AC
  Administered 2017-02-26: 14:00:00 via INTRAVENOUS

## 2017-02-26 MED ORDER — KETOROLAC TROMETHAMINE 15 MG/ML IJ SOLN
30.0000 mg | Freq: Once | INTRAMUSCULAR | Status: AC
  Administered 2017-02-26: 30 mg via INTRAVENOUS

## 2017-02-26 MED ORDER — KETOROLAC TROMETHAMINE 15 MG/ML IJ SOLN
INTRAMUSCULAR | Status: AC
  Filled 2017-02-26: qty 2

## 2017-02-26 NOTE — Patient Instructions (Signed)
Daptomycin injection What is this medicine? DAPTOMYCIN (DAP toe MYE sin) is a lipopeptide antibiotic. It is used to treat certain kinds of bacterial infections. It will not work for colds, flu, or other viral infections. This medicine may be used for other purposes; ask your health care provider or pharmacist if you have questions. COMMON BRAND NAME(S): Cubicin, Cubicin RF What should I tell my health care provider before I take this medicine? They need to know if you have any of these conditions: -kidney disease -an unusual or allergic reaction to daptomycin, other medicines, foods, dyes, or preservatives -pregnant or trying to get pregnant -breast-feeding How should I use this medicine? This medicine is for infusion into a vein. It is usually given by a health care professional in a hospital or clinic setting. If you get this medicine at home, you will be taught how to prepare and give this medicine. Use exactly as directed. Take your medicine at regular intervals. Do not take your medicine more often than directed. Take all of your medicine as directed even if you think you are better. Do not skip doses or stop your medicine early. It is important that you put your used needles and syringes in a special sharps container. Do not put them in a trash can. If you do not have a sharps container, call your pharmacist or healthcare provider to get one. Talk to your pediatrician regarding the use of this medicine in children. While this drug may be prescribed for children as young as 1 year for selected conditions, precautions do apply. Overdosage: If you think you have taken too much of this medicine contact a poison control center or emergency room at once. NOTE: This medicine is only for you. Do not share this medicine with others. What if I miss a dose? If you miss a dose, take it as soon as you can. If it is almost time for your next dose, take only that dose. Do not take double or extra  doses. What may interact with this medicine? -birth control pills -some antibiotics like tobramycin This list may not describe all possible interactions. Give your health care provider a list of all the medicines, herbs, non-prescription drugs, or dietary supplements you use. Also tell them if you smoke, drink alcohol, or use illegal drugs. Some items may interact with your medicine. What should I watch for while using this medicine? Your condition will be monitored carefully while you are receiving this medicine. Do not treat diarrhea with over the counter products. Contact your doctor if you have diarrhea that lasts more than 2 days or if it is severe and watery. What side effects may I notice from receiving this medicine? Side effects that you should report to your doctor or health care professional as soon as possible: -allergic reactions like skin rash, itching or hives, swelling of the face, lips, or tongue -breathing problems -fever, infection -high or low blood pressure -muscle pain -numb or tingling pain -trouble passing urine or change in the amount of urine -unusually tired or weak -vomiting Side effects that usually do not require medical attention (report to your doctor or health care professional if they continue or are bothersome): -constipation or diarrhea -trouble sleeping -headache -nausea -stomach upset This list may not describe all possible side effects. Call your doctor for medical advice about side effects. You may report side effects to FDA at 1-800-FDA-1088. Where should I keep my medicine? Keep out of the reach of children. If you are using   this medicine at home, you will be instructed on how to store this medicine. Throw away any unused medicine after the expiration date on the label. NOTE: This sheet is a summary. It may not cover all possible information. If you have questions about this medicine, talk to your doctor, pharmacist, or health care provider.   2018 Elsevier/Gold Standard (2015-10-12 11:21:16)  

## 2017-02-26 NOTE — Progress Notes (Signed)
1st set of BC's drawn with IV stick to left ac without difficulty

## 2017-02-26 NOTE — Progress Notes (Signed)
Mr. Baird came back in today and has worsening pain, redness and swelling now of the whole right foot despite . CT showed possible cellulitis. Per Dr. Marin Olp, we will give Cubicin IV today and tomorrow and Toradol. We will get blood cultures. We will see how things look in the morning and also get him on an oral antibiotic tomorrow. He is otherwise asymptomatic. No fever, chills or GI upset. VSS. BP 99/61, HR 86, Temp 98.4 and O2 sat 100%.

## 2017-02-27 ENCOUNTER — Other Ambulatory Visit: Payer: Self-pay

## 2017-02-27 ENCOUNTER — Ambulatory Visit (HOSPITAL_BASED_OUTPATIENT_CLINIC_OR_DEPARTMENT_OTHER): Payer: 59

## 2017-02-27 ENCOUNTER — Other Ambulatory Visit: Payer: Self-pay | Admitting: Family

## 2017-02-27 ENCOUNTER — Other Ambulatory Visit: Payer: 59

## 2017-02-27 VITALS — BP 107/64 | HR 65 | Temp 98.5°F | Resp 18

## 2017-02-27 DIAGNOSIS — L03119 Cellulitis of unspecified part of limb: Secondary | ICD-10-CM

## 2017-02-27 DIAGNOSIS — C9000 Multiple myeloma not having achieved remission: Secondary | ICD-10-CM

## 2017-02-27 DIAGNOSIS — M79671 Pain in right foot: Secondary | ICD-10-CM

## 2017-02-27 MED ORDER — SODIUM CHLORIDE 0.9 % IV SOLN
6.1000 mg/kg | INTRAVENOUS | Status: DC
  Administered 2017-02-27: 443 mg via INTRAVENOUS
  Filled 2017-02-27: qty 8.86

## 2017-02-27 MED ORDER — AMOXICILLIN-POT CLAVULANATE 875-125 MG PO TABS: 1.0000 | ORAL_TABLET | Freq: Two times a day (BID) | ORAL | 0 refills | Status: DC

## 2017-02-27 MED ORDER — KETOROLAC TROMETHAMINE 15 MG/ML IJ SOLN
INTRAMUSCULAR | Status: AC
  Filled 2017-02-27: qty 2

## 2017-02-27 MED ORDER — KETOROLAC TROMETHAMINE 15 MG/ML IJ SOLN
30.0000 mg | Freq: Once | INTRAMUSCULAR | Status: AC
  Administered 2017-02-27: 30 mg via INTRAVENOUS

## 2017-02-27 NOTE — Patient Instructions (Signed)
Daptomycin injection What is this medicine? DAPTOMYCIN (DAP toe MYE sin) is a lipopeptide antibiotic. It is used to treat certain kinds of bacterial infections. It will not work for colds, flu, or other viral infections. This medicine may be used for other purposes; ask your health care provider or pharmacist if you have questions. COMMON BRAND NAME(S): Cubicin, Cubicin RF What should I tell my health care provider before I take this medicine? They need to know if you have any of these conditions: -kidney disease -an unusual or allergic reaction to daptomycin, other medicines, foods, dyes, or preservatives -pregnant or trying to get pregnant -breast-feeding How should I use this medicine? This medicine is for infusion into a vein. It is usually given by a health care professional in a hospital or clinic setting. If you get this medicine at home, you will be taught how to prepare and give this medicine. Use exactly as directed. Take your medicine at regular intervals. Do not take your medicine more often than directed. Take all of your medicine as directed even if you think you are better. Do not skip doses or stop your medicine early. It is important that you put your used needles and syringes in a special sharps container. Do not put them in a trash can. If you do not have a sharps container, call your pharmacist or healthcare provider to get one. Talk to your pediatrician regarding the use of this medicine in children. While this drug may be prescribed for children as young as 1 year for selected conditions, precautions do apply. Overdosage: If you think you have taken too much of this medicine contact a poison control center or emergency room at once. NOTE: This medicine is only for you. Do not share this medicine with others. What if I miss a dose? If you miss a dose, take it as soon as you can. If it is almost time for your next dose, take only that dose. Do not take double or extra  doses. What may interact with this medicine? -birth control pills -some antibiotics like tobramycin This list may not describe all possible interactions. Give your health care provider a list of all the medicines, herbs, non-prescription drugs, or dietary supplements you use. Also tell them if you smoke, drink alcohol, or use illegal drugs. Some items may interact with your medicine. What should I watch for while using this medicine? Your condition will be monitored carefully while you are receiving this medicine. Do not treat diarrhea with over the counter products. Contact your doctor if you have diarrhea that lasts more than 2 days or if it is severe and watery. What side effects may I notice from receiving this medicine? Side effects that you should report to your doctor or health care professional as soon as possible: -allergic reactions like skin rash, itching or hives, swelling of the face, lips, or tongue -breathing problems -fever, infection -high or low blood pressure -muscle pain -numb or tingling pain -trouble passing urine or change in the amount of urine -unusually tired or weak -vomiting Side effects that usually do not require medical attention (report to your doctor or health care professional if they continue or are bothersome): -constipation or diarrhea -trouble sleeping -headache -nausea -stomach upset This list may not describe all possible side effects. Call your doctor for medical advice about side effects. You may report side effects to FDA at 1-800-FDA-1088. Where should I keep my medicine? Keep out of the reach of children. If you are using   this medicine at home, you will be instructed on how to store this medicine. Throw away any unused medicine after the expiration date on the label. NOTE: This sheet is a summary. It may not cover all possible information. If you have questions about this medicine, talk to your doctor, pharmacist, or health care provider.   2018 Elsevier/Gold Standard (2015-10-12 11:21:16)  

## 2017-03-01 ENCOUNTER — Encounter (HOSPITAL_COMMUNITY): Payer: Self-pay | Admitting: Emergency Medicine

## 2017-03-01 ENCOUNTER — Inpatient Hospital Stay (HOSPITAL_COMMUNITY)
Admission: EM | Admit: 2017-03-01 | Discharge: 2017-03-05 | DRG: 603 | Disposition: A | Discharge status: Home / Self Care | Payer: 59 | Attending: Internal Medicine | Admitting: Internal Medicine

## 2017-03-01 DIAGNOSIS — E611 Iron deficiency: Secondary | ICD-10-CM | POA: Diagnosis not present

## 2017-03-01 DIAGNOSIS — F419 Anxiety disorder, unspecified: Secondary | ICD-10-CM | POA: Diagnosis present

## 2017-03-01 DIAGNOSIS — D709 Neutropenia, unspecified: Secondary | ICD-10-CM | POA: Diagnosis not present

## 2017-03-01 DIAGNOSIS — C9001 Multiple myeloma in remission: Secondary | ICD-10-CM | POA: Diagnosis not present

## 2017-03-01 DIAGNOSIS — Z87891 Personal history of nicotine dependence: Secondary | ICD-10-CM

## 2017-03-01 DIAGNOSIS — K219 Gastro-esophageal reflux disease without esophagitis: Secondary | ICD-10-CM | POA: Diagnosis present

## 2017-03-01 DIAGNOSIS — Z79899 Other long term (current) drug therapy: Secondary | ICD-10-CM

## 2017-03-01 DIAGNOSIS — F329 Major depressive disorder, single episode, unspecified: Secondary | ICD-10-CM | POA: Diagnosis present

## 2017-03-01 DIAGNOSIS — C9000 Multiple myeloma not having achieved remission: Secondary | ICD-10-CM | POA: Diagnosis present

## 2017-03-01 DIAGNOSIS — Z923 Personal history of irradiation: Secondary | ICD-10-CM

## 2017-03-01 DIAGNOSIS — I82501 Chronic embolism and thrombosis of unspecified deep veins of right lower extremity: Secondary | ICD-10-CM | POA: Diagnosis present

## 2017-03-01 DIAGNOSIS — D500 Iron deficiency anemia secondary to blood loss (chronic): Secondary | ICD-10-CM

## 2017-03-01 DIAGNOSIS — G8929 Other chronic pain: Secondary | ICD-10-CM | POA: Diagnosis present

## 2017-03-01 DIAGNOSIS — L03115 Cellulitis of right lower limb: Secondary | ICD-10-CM | POA: Diagnosis not present

## 2017-03-01 DIAGNOSIS — I82411 Acute embolism and thrombosis of right femoral vein: Secondary | ICD-10-CM | POA: Diagnosis not present

## 2017-03-01 DIAGNOSIS — D649 Anemia, unspecified: Secondary | ICD-10-CM | POA: Diagnosis not present

## 2017-03-01 DIAGNOSIS — Z7901 Long term (current) use of anticoagulants: Secondary | ICD-10-CM | POA: Diagnosis not present

## 2017-03-01 DIAGNOSIS — M7989 Other specified soft tissue disorders: Secondary | ICD-10-CM | POA: Diagnosis not present

## 2017-03-01 DIAGNOSIS — L0390 Cellulitis, unspecified: Secondary | ICD-10-CM | POA: Diagnosis present

## 2017-03-01 DIAGNOSIS — Z9484 Stem cells transplant status: Secondary | ICD-10-CM | POA: Diagnosis not present

## 2017-03-01 DIAGNOSIS — I82811 Embolism and thrombosis of superficial veins of right lower extremities: Secondary | ICD-10-CM | POA: Diagnosis not present

## 2017-03-01 DIAGNOSIS — L03031 Cellulitis of right toe: Secondary | ICD-10-CM | POA: Diagnosis not present

## 2017-03-01 DIAGNOSIS — Z86718 Personal history of other venous thrombosis and embolism: Secondary | ICD-10-CM | POA: Diagnosis not present

## 2017-03-01 LAB — CBC WITH DIFFERENTIAL/PLATELET
BAND NEUTROPHILS: 15 %
BASOS PCT: 0 %
BLASTS: 0 %
Basophils Absolute: 0 10*3/uL (ref 0.0–0.1)
EOS ABS: 0.2 10*3/uL (ref 0.0–0.7)
Eosinophils Relative: 12 %
HCT: 29.8 % — ABNORMAL LOW (ref 39.0–52.0)
HEMOGLOBIN: 9.7 g/dL — AB (ref 13.0–17.0)
LYMPHS PCT: 36 %
Lymphs Abs: 0.7 10*3/uL (ref 0.7–4.0)
MCH: 26.4 pg (ref 26.0–34.0)
MCHC: 32.6 g/dL (ref 30.0–36.0)
MCV: 81 fL (ref 78.0–100.0)
MONO ABS: 0.4 10*3/uL (ref 0.1–1.0)
MYELOCYTES: 0 %
Metamyelocytes Relative: 0 %
Monocytes Relative: 22 %
NEUTROS PCT: 9 %
NRBC: 0 /100{WBCs}
Neutro Abs: 0.5 10*3/uL — ABNORMAL LOW (ref 1.7–7.7)
OTHER: 6 %
PLATELETS: 193 10*3/uL (ref 150–400)
PROMYELOCYTES ABS: 0 %
RBC: 3.68 MIL/uL — ABNORMAL LOW (ref 4.22–5.81)
RDW: 18 % — ABNORMAL HIGH (ref 11.5–15.5)
WBC: 2 10*3/uL — ABNORMAL LOW (ref 4.0–10.5)

## 2017-03-01 LAB — URINALYSIS, ROUTINE W REFLEX MICROSCOPIC
BILIRUBIN URINE: NEGATIVE
Bacteria, UA: NONE SEEN
GLUCOSE, UA: NEGATIVE mg/dL
KETONES UR: NEGATIVE mg/dL
LEUKOCYTES UA: NEGATIVE
Nitrite: NEGATIVE
Protein, ur: NEGATIVE mg/dL
Specific Gravity, Urine: 1.013 (ref 1.005–1.030)
Squamous Epithelial / LPF: NONE SEEN
pH: 5 (ref 5.0–8.0)

## 2017-03-01 LAB — COMPREHENSIVE METABOLIC PANEL
ALK PHOS: 62 U/L (ref 38–126)
ALT: 31 U/L (ref 17–63)
ANION GAP: 7 (ref 5–15)
AST: 23 U/L (ref 15–41)
Albumin: 3.6 g/dL (ref 3.5–5.0)
BILIRUBIN TOTAL: 0.5 mg/dL (ref 0.3–1.2)
BUN: 13 mg/dL (ref 6–20)
CO2: 26 mmol/L (ref 22–32)
CREATININE: 0.82 mg/dL (ref 0.61–1.24)
Calcium: 9 mg/dL (ref 8.9–10.3)
Chloride: 105 mmol/L (ref 101–111)
GFR calc non Af Amer: >60 mL/min (ref >60)
Glucose, Bld: 110 mg/dL — ABNORMAL HIGH (ref 65–99)
Potassium: 4 mmol/L (ref 3.5–5.1)
Sodium: 138 mmol/L (ref 135–145)
TOTAL PROTEIN: 6.6 g/dL (ref 6.5–8.1)

## 2017-03-01 LAB — I-STAT CG4 LACTIC ACID, ED
LACTIC ACID, VENOUS: 0.68 mmol/L (ref 0.5–1.9)
Lactic Acid, Venous: 1.18 mmol/L (ref 0.5–1.9)

## 2017-03-01 MED ORDER — ENOXAPARIN SODIUM 40 MG/0.4ML ~~LOC~~ SOLN: 40.0000 mg | SUBCUTANEOUS | Status: DC

## 2017-03-01 MED ORDER — ROPINIROLE HCL 0.25 MG PO TABS
0.5000 mg | ORAL_TABLET | Freq: Every day | ORAL | Status: DC
  Administered 2017-03-01 – 2017-03-04 (×4): 0.5 mg via ORAL
  Filled 2017-03-01 (×4): qty 2

## 2017-03-01 MED ORDER — ZOLPIDEM TARTRATE 5 MG PO TABS
5.0000 mg | ORAL_TABLET | Freq: Every evening | ORAL | Status: DC | PRN
  Administered 2017-03-02 – 2017-03-04 (×4): 5 mg via ORAL
  Filled 2017-03-01 (×4): qty 1

## 2017-03-01 MED ORDER — HYDROCODONE-ACETAMINOPHEN 10-325 MG PO TABS: 0.5000 | ORAL_TABLET | Freq: Three times a day (TID) | ORAL | Status: DC | PRN

## 2017-03-01 MED ORDER — SODIUM CHLORIDE 0.9 % IV BOLUS (SEPSIS)
1000.0000 mL | Freq: Once | INTRAVENOUS | Status: AC
  Administered 2017-03-01: 1000 mL via INTRAVENOUS

## 2017-03-01 MED ORDER — OXYCODONE HCL 5 MG PO TABS
15.0000 mg | ORAL_TABLET | Freq: Once | ORAL | Status: AC
  Administered 2017-03-01: 15 mg via ORAL
  Filled 2017-03-01: qty 3

## 2017-03-01 MED ORDER — MORPHINE SULFATE (PF) 2 MG/ML IV SOLN
4.0000 mg | Freq: Once | INTRAVENOUS | Status: AC
  Administered 2017-03-01: 4 mg via INTRAVENOUS
  Filled 2017-03-01: qty 2

## 2017-03-01 MED ORDER — CARISOPRODOL 350 MG PO TABS: 350.0000 mg | ORAL_TABLET | Freq: Four times a day (QID) | ORAL | Status: DC | PRN

## 2017-03-01 MED ORDER — POLYETHYLENE GLYCOL 3350 17 G PO PACK
17.0000 g | PACK | Freq: Every day | ORAL | Status: DC
  Filled 2017-03-01: qty 1

## 2017-03-01 MED ORDER — SERTRALINE HCL 100 MG PO TABS
100.0000 mg | ORAL_TABLET | Freq: Every day | ORAL | Status: DC
  Administered 2017-03-01 – 2017-03-04 (×4): 100 mg via ORAL
  Filled 2017-03-01 (×4): qty 1

## 2017-03-01 MED ORDER — VITAMIN D3 25 MCG (1000 UNIT) PO TABS
5000.0000 [IU] | ORAL_TABLET | Freq: Every day | ORAL | Status: DC
  Administered 2017-03-02 – 2017-03-05 (×4): 5000 [IU] via ORAL
  Filled 2017-03-01 (×4): qty 5

## 2017-03-01 MED ORDER — FAMCICLOVIR 500 MG PO TABS
500.0000 mg | ORAL_TABLET | Freq: Every day | ORAL | Status: DC
  Administered 2017-03-02 – 2017-03-05 (×4): 500 mg via ORAL
  Filled 2017-03-01 (×4): qty 1

## 2017-03-01 MED ORDER — OXYCODONE HCL 5 MG PO TABS
10.0000 mg | ORAL_TABLET | ORAL | Status: DC | PRN
  Administered 2017-03-01 – 2017-03-05 (×14): 10 mg via ORAL
  Filled 2017-03-01 (×15): qty 2

## 2017-03-01 MED ORDER — RIVAROXABAN 10 MG PO TABS
10.0000 mg | ORAL_TABLET | Freq: Every day | ORAL | Status: DC
  Administered 2017-03-01 – 2017-03-04 (×4): 10 mg via ORAL
  Filled 2017-03-01 (×4): qty 1

## 2017-03-01 MED ORDER — CEFAZOLIN SODIUM-DEXTROSE 1-4 GM/50ML-% IV SOLN
1.0000 g | Freq: Once | INTRAVENOUS | Status: AC
  Administered 2017-03-01: 1 g via INTRAVENOUS
  Filled 2017-03-01: qty 50

## 2017-03-01 MED ORDER — MAGNESIUM OXIDE 400 (241.3 MG) MG PO TABS
400.0000 mg | ORAL_TABLET | Freq: Every day | ORAL | Status: DC
  Administered 2017-03-02 – 2017-03-05 (×4): 400 mg via ORAL
  Filled 2017-03-01 (×4): qty 1

## 2017-03-01 MED ORDER — OXYCODONE-ACETAMINOPHEN 5-325 MG PO TABS: 1.0000 | ORAL_TABLET | Freq: Once | ORAL | Status: DC

## 2017-03-01 MED ORDER — FENTANYL 25 MCG/HR TD PT72
25.0000 ug | MEDICATED_PATCH | TRANSDERMAL | Status: DC
  Administered 2017-03-02 – 2017-03-04 (×2): 25 ug via TRANSDERMAL
  Filled 2017-03-01 (×2): qty 1

## 2017-03-01 MED ORDER — CEFAZOLIN SODIUM-DEXTROSE 2-4 GM/100ML-% IV SOLN
2.0000 g | Freq: Three times a day (TID) | INTRAVENOUS | Status: DC
  Administered 2017-03-01 – 2017-03-05 (×11): 2 g via INTRAVENOUS
  Filled 2017-03-01 (×15): qty 100

## 2017-03-01 MED ORDER — SACCHAROMYCES BOULARDII 250 MG PO CAPS
250.0000 mg | ORAL_CAPSULE | Freq: Every day | ORAL | Status: DC
  Administered 2017-03-02 – 2017-03-05 (×4): 250 mg via ORAL
  Filled 2017-03-01 (×4): qty 1

## 2017-03-01 NOTE — H&P (Signed)
History and Physical    Jorge [name] ZGY:[id number] DOB: [birth year] DOA: 03/01/2017  PCP: Mackie Pai, PA-C Patient coming from: home  Chief Complaint: leg pain  HPI: Jorge [name] is a 61 y.o. male with medical history significant of  multiple myeloma admitted with right foot cellulitis which has been getting worse in spite of outpatient antibiotic treatment. Patient reports that he started walking a lot and he started with pain in the second metatarsal. He went to or tomorrow and they thought that he probably had a silent fracture. Patient continued to walk and then noticed his right great toe turning red and painful. He went to his primary care doctor and they thought it was gout and was treated with colchicine. His symptoms did not improve any at all with course and treatment. Then he noticed the redness and the erythema and the pain and tenderness extending towards his ankle. He also received IV daptomycin as an outpatient 2 doses. He had a CT scan of his right foot which showed cellulitis. He also received Augmentin as an outpatient for the same reason. He is being admitted for IV antibiotics.    ED Course: He received Ancef 1 dose in the ER.  Review of Systems: As per HPI otherwise all other systems reviewed and are negative  Ambulatory Status  Past Medical History:  Diagnosis Date  . Anxiety   . Bone metastasis ([place])   . Chest cold 05/19/2016   productive cough  -- started on antibiotic  . Chronic back pain    due to bone mets from myeloma  . Cough   . Depression   . GERD (gastroesophageal reflux disease)   . Hiatal hernia   . History of chicken pox   . History of concussion    age 16 -- no residual  . History of DVT of lower extremity 03/21/2016  treated and completed w/ xarelto   per doppler left extensive occlusion common femoral, femoral, and popliteal veins and right partial occlusion common femoral and profunda femoral veins/  last doppler 06-04-2016 no evidence  acute or chronic dvt noted either leg   . History of radiation therapy 06/09/16-06/23/16   lower thoracic spine 25 Gy in 10 fractions  . Mouth ulcers    secondary to radiation  . Multiple myeloma ([place]) dx 02/22/2016 via bone marrow bx---  oncologist-  [name]   IgG Kappa-- Hyperdiploid/ +11 w/ bone mets--  current treatment chemotherapy (started 08/ 2017)and pallitive radiation to back started 06-09-2016  . Renal calculus, right   . Wears contact lenses     Past Surgical History:  Procedure Laterality Date  . COLONOSCOPY  [id number]  . CYSTOSCOPY W/ URETERAL STENT PLACEMENT Right 06/20/2016   Procedure: CYSTOSCOPY WITH STENT REPLACEMENT;  Surgeon: Kathie Rhodes, MD;  Location[facility];  Service: Urology;  Laterality: Right;  . CYSTOSCOPY WITH RETROGRADE PYELOGRAM, URETEROSCOPY AND STENT PLACEMENT Right 05/30/2016   Procedure: CYSTOSCOPY WITH RETROGRADE PYELOGRAM, URETEROSCOPY AND STENT PLACEMENT,DILITATION URETERAL STRICTURE;  Surgeon: Kathie Rhodes, MD;  Location: [facility];  Service: Urology;  Laterality: Right;  . CYSTOSCOPY/RETROGRADE/URETEROSCOPY/STONE EXTRACTION WITH BASKET Right 06/20/2016   Procedure: CYSTOSCOPY/URETEROSCOPY/STONE EXTRACTION WITH BASKET;  Surgeon: Kathie Rhodes, MD;  Location: [facility];  Service: Urology;  Laterality: Right;  . HOLMIUM LASER APPLICATION Right 59/07/6382   Procedure: HOLMIUM LASER APPLICATION;  Surgeon: Kathie Rhodes, MD;  Location: [facility];  Service: Urology;  Laterality: Right;  . IR GENERIC HISTORICAL  02/11/2016   IR  RADIOLOGIST EVAL & MGMT 02/11/2016 [facility]  . IR GENERIC HISTORICAL  02/15/2016   IR BONE TUMOR(S)RF ABLATION 02/15/2016 Luanne Bras, MD [facility]  . IR GENERIC HISTORICAL  02/15/2016   IR BONE TUMOR(S)RF ABLATION 02/15/2016 Luanne Bras, MD [facility]  . IR GENERIC HISTORICAL  02/15/2016   IR BONE TUMOR(S)RF ABLATION 02/15/2016 Luanne Bras, MD [facility]  . IR  GENERIC HISTORICAL  02/15/2016   IR KYPHO THORACIC WITH BONE BIOPSY 02/15/2016 Luanne Bras, MD [facility]  . IR GENERIC HISTORICAL  02/15/2016   IR KYPHO THORACIC WITH BONE BIOPSY 02/15/2016 Luanne Bras, MD [facility]  . IR GENERIC HISTORICAL  02/15/2016   IR VERTEBROPLASTY CERV/THOR BX INC UNI/BIL INC/INJECT/IMAGING 02/15/2016 Luanne Bras, MD [facility]  . IR GENERIC HISTORICAL  03/13/2016   IR KYPHO EA ADDL LEVEL THORACIC OR LUMBAR 03/13/2016 Luanne Bras, MD [facility]  . IR GENERIC HISTORICAL  03/13/2016   IR KYPHO EA ADDL LEVEL THORACIC OR LUMBAR 03/13/2016 Luanne Bras, MD [facility]  . IR GENERIC HISTORICAL  03/13/2016   IR BONE TUMOR(S)RF ABLATION 03/13/2016 Luanne Bras, MD [facility]  . IR GENERIC HISTORICAL  03/13/2016   IR KYPHO LUMBAR INC FX REDUCE BONE BX UNI/BIL CANNULATION INC/IMAGING 03/13/2016 Luanne Bras, MD [facility]  . IR GENERIC HISTORICAL  03/13/2016   IR BONE TUMOR(S)RF ABLATION 03/13/2016 Luanne Bras, MD [facility]  . IR GENERIC HISTORICAL  03/13/2016   IR BONE TUMOR(S)RF ABLATION 03/13/2016 Luanne Bras, MD [facility]  . IR GENERIC HISTORICAL  03/31/2016   IR RADIOLOGIST EVAL & MGMT 03/31/2016 [facility]  . LAPAROSCOPIC INGUINAL HERNIA REPAIR Bilateral 12-16-2013  dr gross  . RADIOLOGY WITH ANESTHESIA N/A 02/15/2016   Procedure: Spinal Ablation;  Surgeon: Luanne Bras, MD;  Location: [place];  Service: Radiology;  Laterality: N/A;  . RADIOLOGY WITH ANESTHESIA N/A 03/13/2016   Procedure: LUMBER ABLATION;  Surgeon: Luanne Bras, MD;  Location: [place];  Service: Radiology;  Laterality: N/A;  . ROTATOR CUFF REPAIR Right 2003  . TONSILLECTOMY  age 62  . WISDOM TOOTH EXTRACTION      Social History   Social History  . Marital status: Married    Spouse name: N/A  . Number of children: 3  . Years of education: N/A   Occupational History  . Not on file.   Social History Main Topics  . Smoking status:  Former Smoker    Packs/day: 1.00    Years: 7.00    Types: Cigarettes    Quit date: 11/02/1980  . Smokeless tobacco: Never Used  . Alcohol use Yes     Comment: occasional  . Drug use: No  . Sexual activity: No   Other Topics Concern  . Not on file   Social History Narrative  . No narrative on file    No Known Allergies  Family History  Problem Relation Age of Onset  . Uterine cancer Mother   . Heart disease Father   . Hypertension Father   . Multiple sclerosis Sister   . Paranoid behavior Brother   . Drug abuse Brother   . Schizophrenia Brother   . Stroke Maternal Grandfather   . Cancer Maternal Aunt   . Leukemia Paternal Aunt   . Healthy Son        x1  . Healthy Daughter        x2  . Allergies Daughter        x1  . Diabetes Neg Hx   .  Alzheimer's disease Neg Hx   . Parkinson's disease Neg Hx       Prior to Admission medications   Medication Sig Start Date End Date Taking? Authorizing Provider  amoxicillin-clavulanate (AUGMENTIN) 875-125 MG tablet Take 1 tablet by mouth 2 (two) times daily. 02/27/17   [place], Holli Humbles, NP  calcium carbonate (TUMS - DOSED IN MG ELEMENTAL CALCIUM) 500 MG chewable tablet Chew 1 tablet by mouth as needed for indigestion or heartburn.    [provider]  carisoprodol (SOMA) 350 MG tablet Take 1 tablet (350 mg total) by mouth 4 (four) times daily as needed for muscle spasms. Patient not taking: Reported on 02/18/2017 02/22/16   [name], MD  Cholecalciferol (VITAMIN D-3) 5000 units TABS Take 5,000 mg by mouth daily.    [provider]  cyclobenzaprine (FLEXERIL) 10 MG tablet Take 10 mg by mouth daily as needed for muscle spasms.  09/01/16   [provider]  famciclovir (FAMVIR) 500 MG tablet Take 1 tablet (500 mg total) by mouth daily. 10/10/16   [name], MD  fentaNYL (DURAGESIC - DOSED MCG/HR) 25 MCG/HR patch Place 1 patch (25 mcg total) onto the skin every other day. 02/16/17   [name], MD    HYDROcodone-acetaminophen (NORCO) 10-325 MG tablet Take 0.5-1 tablets by mouth every 8 (eight) hours as needed for moderate pain. 02/22/16   [name], MD  lenalidomide (REVLIMID) 10 MG capsule TAKE 1 CAPSULE BY MOUTH EVERY DAY FOR 21 DAYS, THEN 7 DAYS OFF. GYIR#[id number] 02/16/17   [name], MD  lidocaine (LIDODERM) 5 % PLAEC 1 PATCH ONTO THE SKIN DAILY.APPLY 1 PATCH TO THE MOST PAINFUL AREA FOR 12 HR IN A 24 HR PERIOD 12/19/16   [provider]  magnesium oxide (MAG-OX) 400 MG tablet Take 400 mg by mouth daily.    [provider]  ondansetron (ZOFRAN-ODT) 8 MG disintegrating tablet Take 1 tablet (8 mg total) by mouth every 8 (eight) hours as needed. Patient not taking: Reported on 02/18/2017 09/12/16   [name], MD  Oxycodone HCl 10 MG TABS Take 1 tablet (10 mg total) by mouth every 4 (four) hours as needed. Patient taking differently: Take 10 mg by mouth every 4 (four) hours as needed (pain).  05/30/16   Kathie Rhodes, MD  polyethylene glycol powder (MIRALAX) powder Take 17 g by mouth daily. 03/26/16   [name], MD  Probiotic Product (PROBIOTIC PO) Take 1 capsule by mouth daily.    [provider]  ranitidine (ZANTAC) 150 MG tablet Take 150 mg by mouth as needed for heartburn.    [provider]  REVLIMID 10 MG capsule TAKE 1 CAPSULE BY MOUTH EVERY DAY FOR 21 DAYS, THEN 7 DAYS OFF 02/25/17   [place], Rudell Cobb, MD  REVLIMID 10 MG capsule TAKE 1 CAPSULE BY MOUTH EVERY DAY FOR 21 DAYS, THEN 7 DAYS OFF 02/27/17   [name], MD  rivaroxaban (XARELTO) 10 MG TABS tablet Take 1 tablet (10 mg total) by mouth daily with supper. 01/28/17   [name], MD  rOPINIRole (REQUIP) 0.25 MG tablet Take one tablet 1-3hours before bed for two days and then increase to two tablets 1-3hours before bed daily. 01/22/17   [place], Holli Humbles, NP  senna (SENOKOT) 8.6 MG TABS tablet Take 2 tablets (17.2 mg total) by mouth daily. Patient taking differently: Take  1 tablet by mouth daily.  05/07/16   [place], Holli Humbles, NP  sertraline (ZOLOFT) 100 MG tablet Take 100 mg by mouth at bedtime.  01/02/16   [provider]  zolpidem (AMBIEN) 10 MG tablet Take 1 tablet (10 mg total) by mouth at bedtime as needed. Patient taking differently: Take 10 mg by mouth at bedtime as needed for sleep.  12/11/16   [name], MD    Physical Exam: Vitals:   03/01/17 0936 03/01/17 1214  BP: 105/65 111/74  Pulse: 85 69  Resp: 16 15  Temp: 98.7 F (37.1 C)   SpO2: 96% 97%     General:  Appears calm and comfortable Eyes:  PERRL, EOMI, normal lids, iris ENT: grossly normal hearing, lips & tongue, mmm Neck:  no LAD, masses or thyromegaly Cardiovascular:  RRR, no m/r/g. No LE edema.  Respiratory:  CTA bilaterally, no w/r/r. Normal respiratory effort. Abdomen:  soft, ntnd, NABS Skin:  no rash or induration seen on limited exam Musculoskeletal:  grossly normal tone BUE/BLE, good ROM, no bony abnormality Psychiatric: grossly normal mood and affect, speech fluent and appropriate, AOx3 Neurologic:  CN 2-12 grossly intact, moves all extremities in coordinated fashion, sensation intact Extremity erythematous,swollen and tender to touch.  Labs on Admission: I have personally reviewed following labs and imaging studies  CBC:  Recent Labs Lab 02/23/17 1412 03/01/17 0953  WBC 3.5* 2.0*  NEUTROABS 1.7 0.5*  HGB 9.6* 9.7*  HCT 30.0* 29.8*  MCV 86 81.0  PLT 150 607   Basic Metabolic Panel:  Recent Labs Lab 02/23/17 1412 03/01/17 0953  NA 135 138  K 3.7 4.0  CL 106 105  CO2 26 26  GLUCOSE 94 110*  BUN 13 13  CREATININE 1.2 0.82  CALCIUM 8.5 9.0   GFR: Estimated Creatinine Clearance: 88.4 mL/min (by C-G formula based on SCr of 0.82 mg/dL). Liver Function Tests:  Recent Labs Lab 03/01/17 0953  AST 23  ALT 31  ALKPHOS 62  BILITOT 0.5  PROT 6.6  ALBUMIN 3.6   No results for input(s): LIPASE, AMYLASE in the last 168 hours. No  results for input(s): AMMONIA in the last 168 hours. Coagulation Profile: No results for input(s): INR, PROTIME in the last 168 hours. Cardiac Enzymes: No results for input(s): CKTOTAL, CKMB, CKMBINDEX, TROPONINI in the last 168 hours. BNP (last 3 results) No results for input(s): PROBNP in the last 8760 hours. HbA1C: No results for input(s): HGBA1C in the last 72 hours. CBG: No results for input(s): GLUCAP in the last 168 hours. Lipid Profile: No results for input(s): CHOL, HDL, LDLCALC, TRIG, CHOLHDL, LDLDIRECT in the last 72 hours. Thyroid Function Tests: No results for input(s): TSH, T4TOTAL, FREET4, T3FREE, THYROIDAB in the last 72 hours. Anemia Panel: No results for input(s): VITAMINB12, FOLATE, FERRITIN, TIBC, IRON, RETICCTPCT in the last 72 hours. Urine analysis:    Component Value Date/Time   COLORURINE YELLOW 03/01/2017 0956   APPEARANCEUR CLEAR 03/01/2017 0956   LABSPEC 1.013 03/01/2017 0956   PHURINE 5.0 03/01/2017 0956   GLUCOSEU NEGATIVE 03/01/2017 0956   HGBUR SMALL (A) 03/01/2017 0956   BILIRUBINUR NEGATIVE 03/01/2017 0956   KETONESUR NEGATIVE 03/01/2017 0956   PROTEINUR NEGATIVE 03/01/2017 0956   NITRITE NEGATIVE 03/01/2017 0956   LEUKOCYTESUR NEGATIVE 03/01/2017 0956    Creatinine Clearance: Estimated Creatinine Clearance: 88.4 mL/min (by C-G formula based on SCr of 0.82 mg/dL).  Sepsis Labs: _0 (procalcitonin:4,lacticidven:4) ) Recent Results (from the past 240 hour(s))  Culture, Blood     Status: None (Preliminary result)   Collection Time: 02/26/17  2:42  PM  Result Value Ref Range Status   BLOOD CULTURE, ROUTINE Preliminary report  Preliminary   Organism ID, Bacteria Comment  Preliminary    Comment: No growth in 36 - 48 hours.  Culture, Blood     Status: None (Preliminary result)   Collection Time: 02/26/17  2:43 PM  Result Value Ref Range Status   BLOOD CULTURE, ROUTINE Preliminary report  Preliminary   Organism ID, Bacteria Comment   Preliminary    Comment: No growth in 36 - 48 hours.     Radiological Exams on Admission: No results found.  EKG: Independently reviewed.   Assessment/Plan Active Problems:   Cellulitis  Right foot cellulitis I will treat him with Ancef 2 g IV every 8. Add probiotics. If his symptoms don't don't get better please consider consulting infectious disease.   multiple myeloma followed at [facility].  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 Which will be continued.      DVT prophylaxis:xarelto Code Status:full Family Communication: wife Disposition Plan: home Consults called:  Admission status: medsurg   Georgette Shell MD [facility]  If 7PM-7AM, please contact night-coverage [email or web address] Password [email or web address]  03/01/2017, 1:51 PM

## 2017-03-01 NOTE — ED Provider Notes (Signed)
[facility] DEPT Provider Note   CSN: [id number] Arrival date & time: 03/01/17  0240     History   Chief Complaint Chief Complaint  Patient presents with  . Leg Swelling    R foot    HPI Jorge [name] is a 61 y.o. male with a history of multiple myeloma who presents today for right foot swelling. The patient notes that he started having right foot swelling 1 week ago which she describes as per diagram. He was seen at the [facility] on 02/23/2017 and given colchicine for his symptoms. There is a noted increase in swelling and redness after this with out relief of his pain which warranted a CT scan on 02/26/2017 in addition to blood cultures. CT scan shows suspicion of cellulitis. The patient received 2 IV doses of antibiotics (Daptomycin). Patient is taking oral Augmentin at home. He has not missed any doses. He notes that since last dose of IV antibiotics he has had increased swelling of the foot down to the level of the ankle as well as redness and pain. He notes yesterday that he had a oral temperature of 99.64F. The patient is taking 10 mg oxycodone at home which she has been prescribed for his cancer without much relief. The patient notes that he is on Xarelto for previous blood clot and has not missed a dose. He denies chest pain, shortness of breath.   HPI  Past Medical History:  Diagnosis Date  . Anxiety   . Bone metastasis ([place])   . Chest cold 05/19/2016   productive cough  -- started on antibiotic  . Chronic back pain    due to bone mets from myeloma  . Cough   . Depression   . GERD (gastroesophageal reflux disease)   . Hiatal hernia   . History of chicken pox   . History of concussion    age 44 -- no residual  . History of DVT of lower extremity 03/21/2016  treated and completed w/ xarelto   per doppler left extensive occlusion common femoral, femoral, and popliteal veins and right partial occlusion common femoral and profunda femoral veins/  last doppler  06-04-2016 no evidence acute or chronic dvt noted either leg   . History of radiation therapy 06/09/16-06/23/16   lower thoracic spine 25 Gy in 10 fractions  . Mouth ulcers    secondary to radiation  . Multiple myeloma ([place]) dx 02/22/2016 via bone marrow bx---  oncologist-  [name]   IgG Kappa-- Hyperdiploid/ +11 w/ bone mets--  current treatment chemotherapy (started 08/ 2017)and pallitive radiation to back started 06-09-2016  . Renal calculus, right   . Wears contact lenses     Patient Active Problem List   Diagnosis Date Noted  . Phlebitis or thrombophlebitis of lower extremity 07/21/2016  . Visit for preventive health examination 05/16/2016  . Aphthous ulcer 05/16/2016  . Ureterolithiasis 05/16/2016  . Acute deep vein thrombosis (DVT) of femoral vein of left lower extremity ([place]) 03/21/2016  . Multiple myeloma ([place]) 02/22/2016  . Multiple myeloma not having achieved remission ([place]) 02/22/2016  . LBP (low back pain) 11/02/2013  . Bilateral inguinal hernia (BIH), s/p lap repair 12/16/2013 11/02/2013    Past Surgical History:  Procedure Laterality Date  . COLONOSCOPY  [id number]  . CYSTOSCOPY W/ URETERAL STENT PLACEMENT Right 06/20/2016   Procedure: CYSTOSCOPY WITH STENT REPLACEMENT;  Surgeon: Kathie Rhodes, MD;  Location: [facility];  Service: Urology;  Laterality: Right;  . CYSTOSCOPY WITH RETROGRADE PYELOGRAM,  URETEROSCOPY AND STENT PLACEMENT Right 05/30/2016   Procedure: CYSTOSCOPY WITH RETROGRADE PYELOGRAM, URETEROSCOPY AND STENT PLACEMENT,DILITATION URETERAL STRICTURE;  Surgeon: Kathie Rhodes, MD;  Location: [facility];  Service: Urology;  Laterality: Right;  . CYSTOSCOPY/RETROGRADE/URETEROSCOPY/STONE EXTRACTION WITH BASKET Right 06/20/2016   Procedure: CYSTOSCOPY/URETEROSCOPY/STONE EXTRACTION WITH BASKET;  Surgeon: Kathie Rhodes, MD;  Location[facility];  Service: Urology;  Laterality: Right;  . HOLMIUM LASER APPLICATION Right 62/02/6380   Procedure:  HOLMIUM LASER APPLICATION;  Surgeon: Kathie Rhodes, MD;  Location: [facility];  Service: Urology;  Laterality: Right;  . IR GENERIC HISTORICAL  02/11/2016   IR RADIOLOGIST EVAL & MGMT 02/11/2016 [facility]  . IR GENERIC HISTORICAL  02/15/2016   IR BONE TUMOR(S)RF ABLATION 02/15/2016 Luanne Bras, MD [facility]  . IR GENERIC HISTORICAL  02/15/2016   IR BONE TUMOR(S)RF ABLATION 02/15/2016 Luanne Bras, MD [facility]  . IR GENERIC HISTORICAL  02/15/2016   IR BONE TUMOR(S)RF ABLATION 02/15/2016 Luanne Bras, MD [facility]  . IR GENERIC HISTORICAL  02/15/2016   IR KYPHO THORACIC WITH BONE BIOPSY 02/15/2016 Luanne Bras, MD [facility]  . IR GENERIC HISTORICAL  02/15/2016   IR KYPHO THORACIC WITH BONE BIOPSY 02/15/2016 Luanne Bras, MD [facility]  . IR GENERIC HISTORICAL  02/15/2016   IR VERTEBROPLASTY CERV/THOR BX INC UNI/BIL INC/INJECT/IMAGING 02/15/2016 Luanne Bras, MD [facility]  . IR GENERIC HISTORICAL  03/13/2016   IR KYPHO EA ADDL LEVEL THORACIC OR LUMBAR 03/13/2016 Luanne Bras, MD [facility]  . IR GENERIC HISTORICAL  03/13/2016   IR KYPHO EA ADDL LEVEL THORACIC OR LUMBAR 03/13/2016 Luanne Bras, MD [facility]  . IR GENERIC HISTORICAL  03/13/2016   IR BONE TUMOR(S)RF ABLATION 03/13/2016 Luanne Bras, MD [facility]  . IR GENERIC HISTORICAL  03/13/2016   IR KYPHO LUMBAR INC FX REDUCE BONE BX UNI/BIL CANNULATION INC/IMAGING 03/13/2016 Luanne Bras, MD [facility]  . IR GENERIC HISTORICAL  03/13/2016   IR BONE TUMOR(S)RF ABLATION 03/13/2016 Luanne Bras, MD [facility]  . IR GENERIC HISTORICAL  03/13/2016   IR BONE TUMOR(S)RF ABLATION 03/13/2016 Luanne Bras, MD [facility]  . IR GENERIC HISTORICAL  03/31/2016   IR RADIOLOGIST EVAL & MGMT 03/31/2016 [facility]  . LAPAROSCOPIC INGUINAL HERNIA REPAIR Bilateral 12-16-2013  dr gross  . RADIOLOGY WITH ANESTHESIA N/A 02/15/2016   Procedure: Spinal Ablation;  Surgeon:  Luanne Bras, MD;  Location: [facility];  Service: Radiology;  Laterality: N/A;  . RADIOLOGY WITH ANESTHESIA N/A 03/13/2016   Procedure: LUMBER ABLATION;  Surgeon: Luanne Bras, MD;  Location: [facility];  Service: Radiology;  Laterality: N/A;  . ROTATOR CUFF REPAIR Right 2003  . TONSILLECTOMY  age 9  . WISDOM TOOTH EXTRACTION         Home Medications    Prior to Admission medications   Medication Sig Start Date End Date Taking? Authorizing Provider  amoxicillin-clavulanate (AUGMENTIN) 875-125 MG tablet Take 1 tablet by mouth 2 (two) times daily. 02/27/17   [place], Holli Humbles, NP  calcium carbonate (TUMS - DOSED IN MG ELEMENTAL CALCIUM) 500 MG chewable tablet Chew 1 tablet by mouth as needed for indigestion or heartburn.    [provider]  carisoprodol (SOMA) 350 MG tablet Take 1 tablet (350 mg total) by mouth 4 (four) times daily as needed for muscle spasms. Patient not taking: Reported on 02/18/2017 02/22/16   [name], MD  Cholecalciferol (VITAMIN D-3) 5000 units TABS Take 5,000 mg by mouth daily.    [provider]  cyclobenzaprine (FLEXERIL) 10 MG tablet Take 10 mg by mouth daily as needed for muscle spasms.  09/01/16   [provider]  famciclovir (FAMVIR) 500 MG tablet Take 1 tablet (500 mg total) by mouth daily. 10/10/16   [name], MD  fentaNYL (DURAGESIC - DOSED MCG/HR) 25 MCG/HR patch Place 1 patch (25 mcg total) onto the skin every other day. 02/16/17   [name], MD  HYDROcodone-acetaminophen (NORCO) 10-325 MG tablet Take 0.5-1 tablets by mouth every 8 (eight) hours as needed for moderate pain. 02/22/16   [name], MD  lenalidomide (REVLIMID) 10 MG capsule TAKE 1 CAPSULE BY MOUTH EVERY DAY FOR 21 DAYS, THEN 7 DAYS OFF. VOPF#[id number] 02/16/17   [name], MD  lidocaine (LIDODERM) 5 % PLAEC 1 PATCH ONTO THE SKIN DAILY.APPLY 1 PATCH TO THE MOST PAINFUL AREA FOR 12 HR IN A 24 HR PERIOD 12/19/16   [provider]    magnesium oxide (MAG-OX) 400 MG tablet Take 400 mg by mouth daily.    [provider]  ondansetron (ZOFRAN-ODT) 8 MG disintegrating tablet Take 1 tablet (8 mg total) by mouth every 8 (eight) hours as needed. Patient not taking: Reported on 02/18/2017 09/12/16   [name], MD  Oxycodone HCl 10 MG TABS Take 1 tablet (10 mg total) by mouth every 4 (four) hours as needed. Patient taking differently: Take 10 mg by mouth every 4 (four) hours as needed (pain).  05/30/16   Kathie Rhodes, MD  polyethylene glycol powder (MIRALAX) powder Take 17 g by mouth daily. 03/26/16   [name], MD  Probiotic Product (PROBIOTIC PO) Take 1 capsule by mouth daily.    [provider]  ranitidine (ZANTAC) 150 MG tablet Take 150 mg by mouth as needed for heartburn.    [provider]  REVLIMID 10 MG capsule TAKE 1 CAPSULE BY MOUTH EVERY DAY FOR 21 DAYS, THEN 7 DAYS OFF 02/25/17   [place], Rudell Cobb, MD  REVLIMID 10 MG capsule TAKE 1 CAPSULE BY MOUTH EVERY DAY FOR 21 DAYS, THEN 7 DAYS OFF 02/27/17   [name], MD  rivaroxaban (XARELTO) 10 MG TABS tablet Take 1 tablet (10 mg total) by mouth daily with supper. 01/28/17   [name], MD  rOPINIRole (REQUIP) 0.25 MG tablet Take one tablet 1-3hours before bed for two days and then increase to two tablets 1-3hours before bed daily. 01/22/17   [place], Holli Humbles, NP  senna (SENOKOT) 8.6 MG TABS tablet Take 2 tablets (17.2 mg total) by mouth daily. Patient taking differently: Take 1 tablet by mouth daily.  05/07/16   [place], Holli Humbles, NP  sertraline (ZOLOFT) 100 MG tablet Take 100 mg by mouth at bedtime.  01/02/16   [provider]  zolpidem (AMBIEN) 10 MG tablet Take 1 tablet (10 mg total) by mouth at bedtime as needed. Patient taking differently: Take 10 mg by mouth at bedtime as needed for sleep.  12/11/16   [name], MD    Family History Family History  Problem Relation Age of Onset  . Uterine cancer Mother   .  Heart disease Father   . Hypertension Father   . Multiple sclerosis Sister   . Paranoid behavior Brother   . Drug abuse Brother   . Schizophrenia Brother   . Stroke Maternal Grandfather   . Cancer Maternal Aunt   . Leukemia Paternal Aunt   . Healthy Son  x1  . Healthy Daughter        x2  . Allergies Daughter        x1  . Diabetes Neg Hx   . Alzheimer's disease Neg Hx   . Parkinson's disease Neg Hx     Social History Social History  Substance Use Topics  . Smoking status: Former Smoker    Packs/day: 1.00    Years: 7.00    Types: Cigarettes    Quit date: 11/02/1980  . Smokeless tobacco: Never Used  . Alcohol use Yes     Comment: occasional     Allergies   Patient has no known allergies.   Review of Systems Review of Systems  All other systems reviewed and are negative.    Physical Exam Updated Vital Signs BP 111/74   Pulse 69   Temp 98.7 F (37.1 C)   Resp 15   SpO2 97%   Physical Exam  Constitutional: He appears well-developed and well-nourished.  HENT:  Head: Normocephalic and atraumatic.  Right Ear: External ear normal.  Left Ear: External ear normal.  Eyes: Conjunctivae are normal. Right eye exhibits no discharge. Left eye exhibits no discharge. No scleral icterus.  Neck: Neck supple.  Cardiovascular: Normal rate and regular rhythm.   Pulses:      Dorsalis pedis pulses are 2+ on the right side, and 2+ on the left side.       Posterior tibial pulses are 2+ on the right side, and 2+ on the left side.  Pulmonary/Chest: Effort normal. No respiratory distress.  Musculoskeletal:  Patient able to move ankle in inversion, eversion, flexion and extension. Tolerates passive ROM. NVI. Compartments soft.   Neurological: He is alert.  Skin: Skin is warm, dry and intact. Capillary refill takes less than 2 seconds. There is erythema. No pallor.  Noted erythema, heat and swelling of the right foot to the level above the ankle. 2+ pitting edema noted of the  foot and ankle. TTP largest over lateral malleolus but diffuse (see pictures below).  Psychiatric: He has a normal mood and affect.  Nursing note and vitals reviewed.        ED Treatments / Results  Labs (all labs ordered are listed, but only abnormal results are displayed) Labs Reviewed  COMPREHENSIVE METABOLIC PANEL - Abnormal; Notable for the following:       Result Value   Glucose, Bld 110 (*)    All other components within normal limits  CBC WITH DIFFERENTIAL/PLATELET - Abnormal; Notable for the following:    WBC 2.0 (*)    RBC 3.68 (*)    Hemoglobin 9.7 (*)    HCT 29.8 (*)    RDW 18.0 (*)    Neutro Abs 0.5 (*)    All other components within normal limits  URINALYSIS, ROUTINE W REFLEX MICROSCOPIC  I-STAT CG4 LACTIC ACID, ED    EKG  EKG Interpretation None       Radiology No results found.  Procedures Procedures (including critical care time)  Medications Ordered in ED Medications  sodium chloride 0.9 % bolus 1,000 mL (not administered)  ceFAZolin (ANCEF) IVPB 1 g/50 mL premix (not administered)  morphine 2 MG/ML injection 4 mg (not administered)     Initial Impression / Assessment and Plan / ED Course  I have reviewed the triage vital signs and the nursing notes.  Pertinent labs & imaging results that were available during my care of the patient were reviewed by me and considered in my medical  decision making (see chart for details).     61 year old male with multiple adenoma presenting for failed outpatient treatment of cellulitis. Pain and swelling initially started in big toe and was thought to be podagra. Treated with colchicine for this on 02/23/2017. Pain, swelling and erythema continued to spread or subsequent days. Patient received CT scan on 02/26/2017 addition to blood cultures. CT scan showed suspicion of cellulitis. Patient received 2 doses of IV daptomycin and is on oral Augmentin. Blood cultures negative. Patient has continued spread of pain,  erythema, swelling now to level of ankle. On presentation the patient's vital signs are reassuring. He is without fever, tachycardia, hypotension, tachypnea or hypoxia. The patient does not appear septic or in any acute distress. Blood taken in triage show normal lactic acid. CMP without acute kidney injury or electrolyte abnormalities. CBC with white blood cell count from 3.5-2.0 from 02/23/2017 to today. Otherwise appears stable from prior. On exam patient has noted erythema, heat and swelling of the right foot to the level above the ankle. 2+ pitting edema noted of the foot and ankle. Range of motion is able and not suggestive of a septic joint. Pain is not out of proportion to suggest necrotizing fasciitis. Will control the patient's pain in the emergency Department, start him on IV fluid and IV antibiotics. Will call for admission for failed outpatient treatment of cellulitis. Hospitalist called and agrees to admit the patient. Discussed this with patient who is agreeable to plan. Patient appears stable for admission.  Patient case seen and discussed with Dr. Gilford Raid who is in agreement with plan.    Final Clinical Impressions(s) / ED Diagnoses   Final diagnoses:  Cellulitis of right lower extremity    New Prescriptions New Prescriptions   No medications on file     [place], Widrig 03/01/17 1216    Isla Pence, MD 03/01/17 1504

## 2017-03-01 NOTE — ED Triage Notes (Signed)
Pt with edema in R foot, currently being treated for cellulitis of R foot. Pt reports edema began in R big toed and now has worsened up to RLE above ankle. Pt's R foot warm and painful. Pt with fevers at home.

## 2017-03-02 ENCOUNTER — Inpatient Hospital Stay (HOSPITAL_COMMUNITY): Payer: 59

## 2017-03-02 ENCOUNTER — Ambulatory Visit: Payer: 59

## 2017-03-02 ENCOUNTER — Other Ambulatory Visit: Payer: 59

## 2017-03-02 DIAGNOSIS — Z86718 Personal history of other venous thrombosis and embolism: Secondary | ICD-10-CM

## 2017-03-02 DIAGNOSIS — D709 Neutropenia, unspecified: Secondary | ICD-10-CM | POA: Diagnosis present

## 2017-03-02 DIAGNOSIS — Z9484 Stem cells transplant status: Secondary | ICD-10-CM

## 2017-03-02 DIAGNOSIS — C9000 Multiple myeloma not having achieved remission: Secondary | ICD-10-CM

## 2017-03-02 DIAGNOSIS — L03031 Cellulitis of right toe: Secondary | ICD-10-CM

## 2017-03-02 DIAGNOSIS — M7989 Other specified soft tissue disorders: Secondary | ICD-10-CM

## 2017-03-02 LAB — BASIC METABOLIC PANEL
ANION GAP: 7 (ref 5–15)
BUN: 11 mg/dL (ref 6–20)
CHLORIDE: 102 mmol/L (ref 101–111)
CO2: 27 mmol/L (ref 22–32)
Calcium: 8.5 mg/dL — ABNORMAL LOW (ref 8.9–10.3)
Creatinine, Ser: 0.82 mg/dL (ref 0.61–1.24)
GFR calc Af Amer: >60 mL/min (ref >60)
Glucose, Bld: 104 mg/dL — ABNORMAL HIGH (ref 65–99)
POTASSIUM: 4 mmol/L (ref 3.5–5.1)
Sodium: 136 mmol/L (ref 135–145)

## 2017-03-02 LAB — CBC WITH DIFFERENTIAL/PLATELET
Basophils Absolute: 0 10*3/uL (ref 0.0–0.1)
Basophils Relative: 0 %
EOS ABS: 0.2 10*3/uL (ref 0.0–0.7)
Eosinophils Relative: 7 %
HCT: 27 % — ABNORMAL LOW (ref 39.0–52.0)
Hemoglobin: 8.5 g/dL — ABNORMAL LOW (ref 13.0–17.0)
LYMPHS ABS: 0.9 10*3/uL (ref 0.7–4.0)
LYMPHS PCT: 36 %
MCH: 25.4 pg — AB (ref 26.0–34.0)
MCHC: 31.5 g/dL (ref 30.0–36.0)
MCV: 80.8 fL (ref 78.0–100.0)
MONO ABS: 0.4 10*3/uL (ref 0.1–1.0)
MONOS PCT: 15 %
MYELOCYTES: 3 %
NEUTROS ABS: 0.9 10*3/uL — AB (ref 1.7–7.7)
Neutrophils Relative %: 39 %
PLATELETS: 181 10*3/uL (ref 150–400)
RBC: 3.34 MIL/uL — ABNORMAL LOW (ref 4.22–5.81)
RDW: 18 % — AB (ref 11.5–15.5)
WBC: 2.4 10*3/uL — AB (ref 4.0–10.5)

## 2017-03-02 MED ORDER — KETOROLAC TROMETHAMINE 15 MG/ML IJ SOLN
7.5000 mg | Freq: Once | INTRAMUSCULAR | Status: AC
  Administered 2017-03-02: 7.5 mg via INTRAVENOUS
  Filled 2017-03-02: qty 1

## 2017-03-02 MED ORDER — POLYETHYLENE GLYCOL 3350 17 G PO PACK
17.0000 g | PACK | Freq: Every day | ORAL | Status: DC
  Administered 2017-03-02 – 2017-03-04 (×3): 17 g via ORAL
  Filled 2017-03-02 (×4): qty 1

## 2017-03-02 NOTE — Progress Notes (Signed)
PROGRESS NOTE  Jorge [name] IFO:[id number] DOB: 01-Dec-1955 DOA: 03/01/2017 PCP: Mackie Pai, PA-C  HPI/Recap of past 90 hours: 61 year old male with past medical history of history of DVT on chronic anticoagulation & multiple myeloma, responding well to treatment admitted on 8/19 for a right foot cellulitis. Symptoms started last week. Had received 2 doses of IV daptomycin and then put on by mouth antibiotics, but actually no improvement. Found to be neutropenic with white blood cell count of 2.0 and hematology consulted. Patient started on IV Ancef.  This morning, patient still complains of swelling and pain in his right foot although he feels like it is a little bit better. Her blood cell count improved slightly from 2.0-2.4. Absolute neutrophil count of 0.9. Seen by hematology.  Patient's biggest complaint is of pain. Oral pain medications not helping very much as he is on chronic pain medications at home.  Assessment/Plan: Principal Problem:   Cellulitis right foot: Seems to be responding to IV Ancef. Continue. Expand antibiotic coverage if symptoms worsen Active Problems:   Multiple myeloma ([place]): On low-dose Revlimid.   Neutropenia ([place]): Unclear etiology. Hematology feels may be from the Cubicin itself. Slowly improving. Follow counts. History of DVT: Continue xarelto  Code Status: Full code   Family Communication: Daughter at the bedside   Disposition Plan: Anticipate discharge in the next few days once saline is much improved and white blood cell counts normal    Consultants:  Hematology   Procedures:  None   Antimicrobials:  IV Cubicin and Augmentin prior to admission  IV Ancef 8/19-present   DVT prophylaxis:  On xarelto   Objective: Vitals:   03/01/17 1430 03/01/17 2040 03/02/17 0445 03/02/17 1344  BP: 123/70 125/77 123/77 108/69  Pulse: 69 81 82 90  Resp: '16 20 20 18  ' Temp: 98 F (36.7 C) 98.1 F (36.7 C) 98.8 F (37.1 C) 98 F (36.7 C)    TempSrc: Oral Oral Oral Oral  SpO2: 99% 95% 95% 92%  Weight: 74.4 kg (164 lb 0.4 oz)     Height: '5\' 7"'  (1.702 m)       Intake/Output Summary (Last 24 hours) at 03/02/17 1525 Last data filed at 03/02/17 1300  Gross per 24 hour  Intake              170 ml  Output             1400 ml  Net            -1230 ml   Filed Weights   03/01/17 1430  Weight: 74.4 kg (164 lb 0.4 oz)    Exam:   General:  Alert and oriented 3, no acute distress  HEENT: Normocephalic and atraumatic, no extremity are moist  Neck: Supple, no JVD   Cardiovascular: Regular rate and rhythm, S1-S2   Respiratory: Clear auscultation bilaterally   Abdomen: Soft, nontender, nondistended, positive bowel sounds   Musculoskeletal: No clubbing or cyanosis. Right foot from ankle down with significant swelling and tenderness. Some mild trace erythema on the dorsal aspect   Skin: Mild erythema on the dorsal aspect of right foot, otherwise no skin breaks, tears or lesions  Psychiatry: Patient appropriate, no evidence of psychoses    Data Reviewed: CBC:  Recent Labs Lab 03/01/17 0953 03/02/17 0737  WBC 2.0* 2.4*  NEUTROABS 0.5* 0.9*  HGB 9.7* 8.5*  HCT 29.8* 27.0*  MCV 81.0 80.8  PLT 193 676   Basic Metabolic Panel:  Recent Labs Lab 03/01/17 0953  03/02/17 0731  NA 138 136  K 4.0 4.0  CL 105 102  CO2 26 27  GLUCOSE 110* 104*  BUN 13 11  CREATININE 0.82 0.82  CALCIUM 9.0 8.5*   GFR: Estimated Creatinine Clearance: 88.4 mL/min (by C-G formula based on SCr of 0.82 mg/dL). Liver Function Tests:  Recent Labs Lab 03/01/17 0953  AST 23  ALT 31  ALKPHOS 62  BILITOT 0.5  PROT 6.6  ALBUMIN 3.6   No results for input(s): LIPASE, AMYLASE in the last 168 hours. No results for input(s): AMMONIA in the last 168 hours. Coagulation Profile: No results for input(s): INR, PROTIME in the last 168 hours. Cardiac Enzymes: No results for input(s): CKTOTAL, CKMB, CKMBINDEX, TROPONINI in the last 168  hours. BNP (last 3 results) No results for input(s): PROBNP in the last 8760 hours. HbA1C: No results for input(s): HGBA1C in the last 72 hours. CBG: No results for input(s): GLUCAP in the last 168 hours. Lipid Profile: No results for input(s): CHOL, HDL, LDLCALC, TRIG, CHOLHDL, LDLDIRECT in the last 72 hours. Thyroid Function Tests: No results for input(s): TSH, T4TOTAL, FREET4, T3FREE, THYROIDAB in the last 72 hours. Anemia Panel: No results for input(s): VITAMINB12, FOLATE, FERRITIN, TIBC, IRON, RETICCTPCT in the last 72 hours. Urine analysis:    Component Value Date/Time   COLORURINE YELLOW 03/01/2017 0956   APPEARANCEUR CLEAR 03/01/2017 0956   LABSPEC 1.013 03/01/2017 0956   PHURINE 5.0 03/01/2017 0956   GLUCOSEU NEGATIVE 03/01/2017 0956   HGBUR SMALL (A) 03/01/2017 0956   BILIRUBINUR NEGATIVE 03/01/2017 0956   KETONESUR NEGATIVE 03/01/2017 0956   PROTEINUR NEGATIVE 03/01/2017 0956   NITRITE NEGATIVE 03/01/2017 0956   LEUKOCYTESUR NEGATIVE 03/01/2017 0956   Sepsis Labs: '@LABRCNTIP' (procalcitonin:4,lacticidven:4)  ) Recent Results (from the past 240 hour(s))  Culture, Blood     Status: None (Preliminary result)   Collection Time: 02/26/17  2:42 PM  Result Value Ref Range Status   BLOOD CULTURE, ROUTINE Preliminary report  Preliminary   Organism ID, Bacteria Comment  Preliminary    Comment: No growth in 36 - 48 hours.  Culture, Blood     Status: None (Preliminary result)   Collection Time: 02/26/17  2:43 PM  Result Value Ref Range Status   BLOOD CULTURE, ROUTINE Preliminary report  Preliminary   Organism ID, Bacteria Comment  Preliminary    Comment: No growth in 36 - 48 hours.  Culture, blood (routine x 2)     Status: None (Preliminary result)   Collection Time: 03/01/17  3:13 PM  Result Value Ref Range Status   Specimen Description BLOOD RIGHT ARM  Final   Special Requests   Final    BOTTLES DRAWN AEROBIC ONLY Blood Culture adequate volume   Culture   Final     NO GROWTH < 24 HOURS Performed at [facility], [address], [place], [place] [address]    Report Status PENDING  Incomplete  Culture, blood (routine x 2)     Status: None (Preliminary result)   Collection Time: 03/01/17  3:13 PM  Result Value Ref Range Status   Specimen Description BLOOD RIGHT FOREARM  Final   Special Requests   Final    BOTTLES DRAWN AEROBIC ONLY Blood Culture adequate volume   Culture   Final    NO GROWTH < 24 HOURS Performed at [facility], [place] [address], [place], [place] [address]    Report Status PENDING  Incomplete      Studies: No  results found.  Scheduled Meds: . cholecalciferol  5,000 Units Oral Daily  . famciclovir  500 mg Oral Daily  . fentaNYL  25 mcg Transdermal Q48H  . magnesium oxide  400 mg Oral Daily  . polyethylene glycol  17 g Oral Daily  . rivaroxaban  10 mg Oral Q supper  . rOPINIRole  0.5 mg Oral QHS  . saccharomyces boulardii  250 mg Oral Daily  . sertraline  100 mg Oral QHS    Continuous Infusions: .  ceFAZolin (ANCEF) IV Stopped (03/02/17 1514)     LOS: 1 day     Annita Brod, MD [facility] Pager [phone]  If 7PM-7AM, please contact night-coverage [email or web address] Password [email or web address] 03/02/2017, 3:25 PM

## 2017-03-02 NOTE — Telephone Encounter (Signed)
Called received from [facility] for Dr. Michelene Heady on 02/17/17 approving use of estim and/or DN for increased muscle tension and pain relief.  Percival Spanish, PT, MPT  [facility] [address]  [place], [place], [address] Phone: [phone]   Fax:  [phone]

## 2017-03-02 NOTE — Progress Notes (Signed)
*  Preliminary Results* Bilateral lower extremity venous duplex completed. Bilateral lower extremities are negative for acute deep vein thrombosis. There is evidence of chronic nonocclusive deep vein thrombosis involving the right common femoral vein, and age indeterminate thrombus involving the right great saphenous vein at the origin. There is no evidence of Baker's cyst bilaterally.  Preliminary results discussed with Dr. [place] Pink.  03/02/2017 4:10 PM  Maudry Mayhew, BS, RVT, RDCS, RDMS

## 2017-03-02 NOTE — Consult Note (Signed)
Referral MD  Reason for Referral: Cellulitis of the right foot, IgG kappa myeloma-status post stem cell transplant; neutropenia of unclear etiology; history of bilateral lower extremity DVT   Chief Complaint  Patient presents with  . Leg Swelling    R foot  : The right foot was not getting better.  HPI: Jorge [name] is well-known to me. He is a 61 year old white male. He has IgG kappa Myeloma. He underwent stem cell transplant at [facility] in February. He had a very nice response. His last M spike was 0.2 g/dL.  He is on low-dose Revlimid.  He does have a history of lower extremity DVTs. His hypercoagulable studies were normal. He is on low-dose Xarelto.  A week ago, he began have some pain and swelling in the right first MTP joint. I thought this was gout. History with colchicine and allopurinol. The pain and swelling and redness got worse. It looked like he had cellulitis. He was given some Cubicin in the office. He was then placed on Augmentin. Things did not improve. He then was admitted yesterday.  Surprisingly, he is neutropenic. I am not sure as to why he is neutropenic. His white cell count 2.0. His absolute neutrophil count is 500. Again [place] why he is neutropenic.  He is on Ancef right now. So far, cultures have been negative.  He's had no fever.  When I saw him this morning, his right foot did look better. It was still swollen.  He denies any trauma. There is no obvious cuts. He has no insect bite. He has no broken skin.     Past Medical History:  Diagnosis Date  . Anxiety   . Bone metastasis ([place])   . Chest cold 05/19/2016   productive cough  -- started on antibiotic  . Chronic back pain    due to bone mets from myeloma  . Cough   . Depression   . GERD (gastroesophageal reflux disease)   . Hiatal hernia   . History of chicken pox   . History of concussion    age 38 -- no residual  . History of DVT of lower extremity 03/21/2016  treated and completed w/ xarelto   per doppler left extensive occlusion common femoral, femoral, and popliteal veins and right partial occlusion common femoral and profunda femoral veins/  last doppler 06-04-2016 no evidence acute or chronic dvt noted either leg   . History of radiation therapy 06/09/16-06/23/16   lower thoracic spine 25 Gy in 10 fractions  . Mouth ulcers    secondary to radiation  . Multiple myeloma ([place]) dx 02/22/2016 via bone marrow bx---  oncologist-  [name]   IgG Kappa-- Hyperdiploid/ +11 w/ bone mets--  current treatment chemotherapy (started 08/ 2017)and pallitive radiation to back started 06-09-2016  . Renal calculus, right   . Wears contact lenses   :  Past Surgical History:  Procedure Laterality Date  . COLONOSCOPY  [id number]  . CYSTOSCOPY W/ URETERAL STENT PLACEMENT Right 06/20/2016   Procedure: CYSTOSCOPY WITH STENT REPLACEMENT;  Surgeon: Kathie Rhodes, MD;  Location: [facility];  Service: Urology;  Laterality: Right;  . CYSTOSCOPY WITH RETROGRADE PYELOGRAM, URETEROSCOPY AND STENT PLACEMENT Right 05/30/2016   Procedure: CYSTOSCOPY WITH RETROGRADE PYELOGRAM, URETEROSCOPY AND STENT PLACEMENT,DILITATION URETERAL STRICTURE;  Surgeon: Kathie Rhodes, MD;  Location: [facility];  Service: Urology;  Laterality: Right;  . CYSTOSCOPY/RETROGRADE/URETEROSCOPY/STONE EXTRACTION WITH BASKET Right 06/20/2016   Procedure: CYSTOSCOPY/URETEROSCOPY/STONE EXTRACTION WITH BASKET;  Surgeon: Kathie Rhodes, MD;  Location: [place];  Service: Urology;  Laterality: Right;  . HOLMIUM LASER APPLICATION Right 59/01/4162   Procedure: HOLMIUM LASER APPLICATION;  Surgeon: Kathie Rhodes, MD;  Location: [facility];  Service: Urology;  Laterality: Right;  . IR GENERIC HISTORICAL  02/11/2016   IR RADIOLOGIST EVAL & MGMT 02/11/2016 [facility]  . IR GENERIC HISTORICAL  02/15/2016   IR BONE TUMOR(S)RF ABLATION 02/15/2016 Luanne Bras, MD [facility]  . IR GENERIC HISTORICAL  02/15/2016   IR  BONE TUMOR(S)RF ABLATION 02/15/2016 Luanne Bras, MD [facility]  . IR GENERIC HISTORICAL  02/15/2016   IR BONE TUMOR(S)RF ABLATION 02/15/2016 Luanne Bras, MD [facility]  . IR GENERIC HISTORICAL  02/15/2016   IR KYPHO THORACIC WITH BONE BIOPSY 02/15/2016 Luanne Bras, MD [facility]  . IR GENERIC HISTORICAL  02/15/2016   IR KYPHO THORACIC WITH BONE BIOPSY 02/15/2016 Luanne Bras, MD [facility]  . IR GENERIC HISTORICAL  02/15/2016   IR VERTEBROPLASTY CERV/THOR BX INC UNI/BIL INC/INJECT/IMAGING 02/15/2016 Luanne Bras, MD [facility]  . IR GENERIC HISTORICAL  03/13/2016   IR KYPHO EA ADDL LEVEL THORACIC OR LUMBAR 03/13/2016 Luanne Bras, MD [facility]  . IR GENERIC HISTORICAL  03/13/2016   IR KYPHO EA ADDL LEVEL THORACIC OR LUMBAR 03/13/2016 Luanne Bras, MD [facility]  . IR GENERIC HISTORICAL  03/13/2016   IR BONE TUMOR(S)RF ABLATION 03/13/2016 Luanne Bras, MD [facility]  . IR GENERIC HISTORICAL  03/13/2016   IR KYPHO LUMBAR INC FX REDUCE BONE BX UNI/BIL CANNULATION INC/IMAGING 03/13/2016 Luanne Bras, MD [facility]  . IR GENERIC HISTORICAL  03/13/2016   IR BONE TUMOR(S)RF ABLATION 03/13/2016 Luanne Bras, MD [facility]  . IR GENERIC HISTORICAL  03/13/2016   IR BONE TUMOR(S)RF ABLATION 03/13/2016 Luanne Bras, MD [facility]  . IR GENERIC HISTORICAL  03/31/2016   IR RADIOLOGIST EVAL & MGMT 03/31/2016 [facility]  . LAPAROSCOPIC INGUINAL HERNIA REPAIR Bilateral 12-16-2013  dr gross  . RADIOLOGY WITH ANESTHESIA N/A 02/15/2016   Procedure: Spinal Ablation;  Surgeon: Luanne Bras, MD;  Location: [place];  Service: Radiology;  Laterality: N/A;  . RADIOLOGY WITH ANESTHESIA N/A 03/13/2016   Procedure: LUMBER ABLATION;  Surgeon: Luanne Bras, MD;  Location: [place];  Service: Radiology;  Laterality: N/A;  . ROTATOR CUFF REPAIR Right 2003  . TONSILLECTOMY  age 43  . WISDOM TOOTH EXTRACTION    :   Current Facility-Administered  Medications:  .  ceFAZolin (ANCEF) IVPB 2g/100 mL premix, 2 g, Intravenous, Q8H, Georgette Shell, MD, Last Rate: 200 mL/hr at 03/02/17 0502, 2 g at 03/02/17 0502 .  cholecalciferol (VITAMIN D) tablet 5,000 Units, 5,000 Units, Oral, Daily, Georgette Shell, MD .  famciclovir [facility]) tablet 500 mg, 500 mg, Oral, Daily, Georgette Shell, MD .  fentaNYL (Odin - dosed mcg/hr) patch 25 mcg, 25 mcg, Transdermal, Q48H, Georgette Shell, MD .  magnesium oxide (MAG-OX) tablet 400 mg, 400 mg, Oral, Daily, Georgette Shell, MD .  oxyCODONE (Oxy IR/ROXICODONE) immediate release tablet 10 mg, 10 mg, Oral, Q4H PRN, Georgette Shell, MD, 10 mg at 03/02/17 0502 .  polyethylene glycol (MIRALAX / GLYCOLAX) packet 17 g, 17 g, Oral, Daily, Annita Brod, MD .  rivaroxaban (XARELTO) tablet 10 mg, 10 mg, Oral, Q supper, Georgette Shell, MD, 10 mg at 03/01/17 1818 .  rOPINIRole (REQUIP) tablet 0.5 mg, 0.5 mg, Oral, QHS, Georgette Shell, MD, 0.5 mg at 03/01/17 2132 .  saccharomyces boulardii (FLORASTOR) capsule  250 mg, 250 mg, Oral, Daily, Georgette Shell, MD .  sertraline (ZOLOFT) tablet 100 mg, 100 mg, Oral, QHS, Georgette Shell, MD, 100 mg at 03/01/17 2132 .  zolpidem (AMBIEN) tablet 5 mg, 5 mg, Oral, QHS PRN, Schorr, Rhetta Mura, NP, 5 mg at 03/02/17 0014:  . cholecalciferol  5,000 Units Oral Daily  . famciclovir  500 mg Oral Daily  . fentaNYL  25 mcg Transdermal Q48H  . magnesium oxide  400 mg Oral Daily  . polyethylene glycol  17 g Oral Daily  . rivaroxaban  10 mg Oral Q supper  . rOPINIRole  0.5 mg Oral QHS  . saccharomyces boulardii  250 mg Oral Daily  . sertraline  100 mg Oral QHS  :  No Known Allergies:  Family History  Problem Relation Age of Onset  . Uterine cancer Mother   . Heart disease Father   . Hypertension Father   . Multiple sclerosis Sister   . Paranoid behavior Brother   . Drug abuse Brother   . Schizophrenia Brother   . Stroke  Maternal Grandfather   . Cancer Maternal Aunt   . Leukemia Paternal Aunt   . Healthy Son        x1  . Healthy Daughter        x2  . Allergies Daughter        x1  . Diabetes Neg Hx   . Alzheimer's disease Neg Hx   . Parkinson's disease Neg Hx   :  Social History   Social History  . Marital status: Married    Spouse name: N/A  . Number of children: 3  . Years of education: N/A   Occupational History  . Not on file.   Social History Main Topics  . Smoking status: Former Smoker    Packs/day: 1.00    Years: 7.00    Types: Cigarettes    Quit date: 11/02/1980  . Smokeless tobacco: Never Used  . Alcohol use Yes     Comment: occasional  . Drug use: No  . Sexual activity: No   Other Topics Concern  . Not on file   Social History Narrative  . No narrative on file  :  Pertinent items are noted in HPI.  Exam: Patient Vitals for the past 24 hrs:  BP Temp Temp src Pulse Resp SpO2 Height Weight  03/02/17 0445 123/77 98.8 F (37.1 C) Oral 82 20 95 % - -  03/01/17 2040 125/77 98.1 F (36.7 C) Oral 81 20 95 % - -  03/01/17 1430 123/70 98 F (36.7 C) Oral 69 16 99 % '5\' 7"'  (1.702 m) 164 lb 0.4 oz (74.4 kg)  03/01/17 1214 111/74 - - 69 15 97 % - -  03/01/17 0936 105/65 98.7 F (37.1 C) - 85 16 96 % - -    As above    Recent Labs  03/01/17 0953  WBC 2.0*  HGB 9.7*  HCT 29.8*  PLT 193    Recent Labs  03/01/17 0953  NA 138  K 4.0  CL 105  CO2 26  GLUCOSE 110*  BUN 13  CREATININE 0.82  CALCIUM 9.0    Blood smear review:  None  Pathology: None     Assessment and Plan:  Jorge [name] is a 61 year old white male. He has myeloma. This really has not been a problem for him. He is on low-dose Revlimid.  I'm not sure why has the cellulitis.  I am quite puzzled  as to why he is neutropenic. I'm unsure if this was the Cubicin that we gave him. This happens very rarely in patients who gotten Cubicin. He was on colchicine.  I would just watch his blood count  today. I would not give him colony stimulating factors yet.  Again the foot does look a a little bit better. Hopefully the IV Ancef is helping.  We will follow up with his blood counts. While he is hospitalized, I plywood do Dopplers of his legs.  I very much appreciate all the help that he is getting from everybody up on [address]  Lattie Haw, MD  Psalm 18:1-2

## 2017-03-02 NOTE — Care Management Note (Signed)
Case Management Note  Patient Details  Name: Jorge [name] MRN: [id number] Date of Birth: Feb 17, 1956  Subjective/Objective:       61 yo admitted with cellulitis of the right foot. Hx of multiple myeloma.             Action/Plan: From home with spouse. Pt independent with ADLs. CM following along for disposition needs.  Expected Discharge Date:                  Expected Discharge Plan:  Home/Self Care  In-House Referral:     Discharge planning Services  CM Consult  Post Acute Care Choice:    Choice offered to:     DME Arranged:    DME Agency:     HH Arranged:    HH Agency:     Status of Service:  In process, will continue to follow  If discussed at Long Length of Stay Meetings, dates discussed:    Additional CommentsLynnell Catalan, RN 03/02/2017, 10:34 AM  [phone]

## 2017-03-03 DIAGNOSIS — D649 Anemia, unspecified: Secondary | ICD-10-CM

## 2017-03-03 DIAGNOSIS — I82411 Acute embolism and thrombosis of right femoral vein: Secondary | ICD-10-CM

## 2017-03-03 DIAGNOSIS — I82811 Embolism and thrombosis of superficial veins of right lower extremities: Secondary | ICD-10-CM

## 2017-03-03 LAB — CBC WITH DIFFERENTIAL/PLATELET
BASOS ABS: 0 10*3/uL (ref 0.0–0.1)
Basophils Relative: 0 %
Eosinophils Absolute: 0.2 10*3/uL (ref 0.0–0.7)
Eosinophils Relative: 7 %
HEMATOCRIT: 25.2 % — AB (ref 39.0–52.0)
HEMOGLOBIN: 8.2 g/dL — AB (ref 13.0–17.0)
LYMPHS ABS: 0.8 10*3/uL (ref 0.7–4.0)
LYMPHS PCT: 28 %
MCH: 26.3 pg (ref 26.0–34.0)
MCHC: 32.5 g/dL (ref 30.0–36.0)
MCV: 80.8 fL (ref 78.0–100.0)
Monocytes Absolute: 0.8 10*3/uL (ref 0.1–1.0)
Monocytes Relative: 28 %
NEUTROS ABS: 1 10*3/uL — AB (ref 1.7–7.7)
NEUTROS PCT: 37 %
Platelets: 155 10*3/uL (ref 150–400)
RBC: 3.12 MIL/uL — ABNORMAL LOW (ref 4.22–5.81)
RDW: 18 % — ABNORMAL HIGH (ref 11.5–15.5)
WBC: 2.8 10*3/uL — AB (ref 4.0–10.5)

## 2017-03-03 LAB — MULTIPLE MYELOMA PANEL, SERUM
ALBUMIN/GLOB SERPL: 1.3 (ref 0.7–1.7)
ALPHA 1: 0.4 g/dL (ref 0.0–0.4)
Albumin SerPl Elph-Mcnc: 3.1 g/dL (ref 2.9–4.4)
Alpha2 Glob SerPl Elph-Mcnc: 0.9 g/dL (ref 0.4–1.0)
B-Globulin SerPl Elph-Mcnc: 0.8 g/dL (ref 0.7–1.3)
Gamma Glob SerPl Elph-Mcnc: 0.5 g/dL (ref 0.4–1.8)
Globulin, Total: 2.5 g/dL (ref 2.2–3.9)
IGA: 106 mg/dL (ref 61–437)
IGM (IMMUNOGLOBULIN M), SRM: 30 mg/dL (ref 20–172)
IgG (Immunoglobin G), Serum: 516 mg/dL — ABNORMAL LOW (ref 700–1600)
M Protein SerPl Elph-Mcnc: 0.1 g/dL — ABNORMAL HIGH
Total Protein ELP: 5.6 g/dL — ABNORMAL LOW (ref 6.0–8.5)

## 2017-03-03 LAB — HIV ANTIBODY (ROUTINE TESTING W REFLEX): HIV SCREEN 4TH GENERATION: NONREACTIVE

## 2017-03-03 MED ORDER — SODIUM CHLORIDE 0.9 % IV SOLN
4.0000 mg | Freq: Once | INTRAVENOUS | Status: AC
  Administered 2017-03-03: 4 mg via INTRAVENOUS
  Filled 2017-03-03: qty 5

## 2017-03-03 NOTE — Progress Notes (Signed)
PROGRESS NOTE  Jorge [name] GEZ:[id number] DOB: 04/01/1956 DOA: 03/01/2017 PCP: Mackie Pai, PA-C  HPI/Recap of past 66 hours: 61 year old male with past medical history of history of DVT on chronic anticoagulation & multiple myeloma, responding well to treatment admitted on 8/19 for a right foot cellulitis. Symptoms started last week. Had received 2 doses of IV daptomycin and then put on by mouth antibiotics, but actually no improvement. Found to be neutropenic with white blood cell count of 2.0 and hematology consulted. Patient started on IV Ancef.  Pt's foot much improved.  White blood cell count increased from 2.4-2.8 with absolute neutrophil count now at 1.0. Seen by hematology.   Lower extremity Dopplers with no evidence of acute DVT. Patient feeling better. Much less pain.     Assessment/Plan: Principal Problem:   Cellulitis right foot: Responding well to IV Ancef.  Much improved, less erythema.  Start ambulating. Active Problems:   Multiple myeloma ([place]): On low-dose Revlimid.    Neutropenia ([place]): Unclear etiology. Hematology feels may be from the Cubicin itself. Continues to improve  History of DVT: Continue xarelto.  Code Status: Full code   Family Communication: Left message with wife  Disposition Plan: Anticipate discharge potentially tomorrow   Consultants:  Hematology   Procedures:  None   Antimicrobials:  IV Cubicin and Augmentin prior to admission  IV Ancef 8/19-present   DVT prophylaxis:  On xarelto   Objective: Vitals:   03/02/17 1344 03/02/17 2136 03/02/17 2205 03/03/17 0558  BP: 108/69 106/71  96/68  Pulse: 90 83  79  Resp: _0 Temp: 98 F (36.7 C) 99.2 F (37.3 C) 98.1 F (36.7 C) 97.8 F (36.6 C)  TempSrc: Oral Oral Oral Oral  SpO2: 92% 93%  95%  Weight:      Height:        Intake/Output Summary (Last 24 hours) at 03/03/17 0858 Last data filed at 03/03/17 6503  Gross per 24 hour  Intake              980 ml  Output              3380 ml  Net            -2400 ml   Filed Weights   03/01/17 1430  Weight: 74.4 kg (164 lb 0.4 oz)    Exam:   General:  Alert and oriented 3, no acute distress  HEENT: Normocephalic and atraumatic, mucous membranes are moist  Neck: Supple, no JVD   Cardiovascular: Regular rate and rhythm, S1-S2   Respiratory: Clear auscultation bilaterally   Abdomen: Soft, nontender, nondistended, positive bowel sounds   Musculoskeletal: No clubbing or cyanosis. Right foot from ankle down much less swelling, tenderness & erythema  Skin: decreased erythema on the dorsal aspect of right foot, otherwise no skin breaks, tears or lesions  Psychiatry: Patient appropriate, no evidence of psychoses   Data Reviewed: CBC:  Recent Labs Lab 03/01/17 0953 03/02/17 0737 03/03/17 0333  WBC 2.0* 2.4* 2.8*  NEUTROABS 0.5* 0.9* 1.0*  HGB 9.7* 8.5* 8.2*  HCT 29.8* 27.0* 25.2*  MCV 81.0 80.8 80.8  PLT 193 181 546   Basic Metabolic Panel:  Recent Labs Lab 03/01/17 0953 03/02/17 0731  NA 138 136  K 4.0 4.0  CL 105 102  CO2 26 27  GLUCOSE 110* 104*  BUN 13 11  CREATININE 0.82 0.82  CALCIUM 9.0 8.5*   GFR: Estimated Creatinine Clearance: 88.4 mL/min (by C-G formula based  on SCr of 0.82 mg/dL). Liver Function Tests:  Recent Labs Lab 03/01/17 0953  AST 23  ALT 31  ALKPHOS 62  BILITOT 0.5  PROT 6.6  ALBUMIN 3.6   No results for input(s): LIPASE, AMYLASE in the last 168 hours. No results for input(s): AMMONIA in the last 168 hours. Coagulation Profile: No results for input(s): INR, PROTIME in the last 168 hours. Cardiac Enzymes: No results for input(s): CKTOTAL, CKMB, CKMBINDEX, TROPONINI in the last 168 hours. BNP (last 3 results) No results for input(s): PROBNP in the last 8760 hours. HbA1C: No results for input(s): HGBA1C in the last 72 hours. CBG: No results for input(s): GLUCAP in the last 168 hours. Lipid Profile: No results for input(s): CHOL, HDL, LDLCALC,  TRIG, CHOLHDL, LDLDIRECT in the last 72 hours. Thyroid Function Tests: No results for input(s): TSH, T4TOTAL, FREET4, T3FREE, THYROIDAB in the last 72 hours. Anemia Panel: No results for input(s): VITAMINB12, FOLATE, FERRITIN, TIBC, IRON, RETICCTPCT in the last 72 hours. Urine analysis:    Component Value Date/Time   COLORURINE YELLOW 03/01/2017 0956   APPEARANCEUR CLEAR 03/01/2017 0956   LABSPEC 1.013 03/01/2017 0956   PHURINE 5.0 03/01/2017 0956   GLUCOSEU NEGATIVE 03/01/2017 0956   HGBUR SMALL (A) 03/01/2017 0956   BILIRUBINUR NEGATIVE 03/01/2017 0956   KETONESUR NEGATIVE 03/01/2017 0956   PROTEINUR NEGATIVE 03/01/2017 0956   NITRITE NEGATIVE 03/01/2017 0956   LEUKOCYTESUR NEGATIVE 03/01/2017 0956   Sepsis Labs: _0 (procalcitonin:4,lacticidven:4)  ) Recent Results (from the past 240 hour(s))  Culture, Blood     Status: None (Preliminary result)   Collection Time: 02/26/17  2:42 PM  Result Value Ref Range Status   BLOOD CULTURE, ROUTINE Preliminary report  Preliminary   Organism ID, Bacteria Comment  Preliminary    Comment: No growth in 36 - 48 hours.  Culture, Blood     Status: None (Preliminary result)   Collection Time: 02/26/17  2:43 PM  Result Value Ref Range Status   BLOOD CULTURE, ROUTINE Preliminary report  Preliminary   Organism ID, Bacteria Comment  Preliminary    Comment: No growth in 36 - 48 hours.  Culture, blood (routine x 2)     Status: None (Preliminary result)   Collection Time: 03/01/17  3:13 PM  Result Value Ref Range Status   Specimen Description BLOOD RIGHT ARM  Final   Special Requests   Final    BOTTLES DRAWN AEROBIC ONLY Blood Culture adequate volume   Culture   Final    NO GROWTH < 24 HOURS Performed at [facility], [address] [place], [place] [address]    Report Status PENDING  Incomplete  Culture, blood (routine x 2)     Status: None (Preliminary result)   Collection Time: 03/01/17  3:13 PM  Result Value Ref Range  Status   Specimen Description BLOOD RIGHT FOREARM  Final   Special Requests   Final    BOTTLES DRAWN AEROBIC ONLY Blood Culture adequate volume   Culture   Final    NO GROWTH < 24 HOURS Performed at [facility], [place] [address] [place], [place] [address]    Report Status PENDING  Incomplete      Studies: No results found.  Scheduled Meds: . cholecalciferol  5,000 Units Oral Daily  . famciclovir  500 mg Oral Daily  . fentaNYL  25 mcg Transdermal Q48H  . magnesium oxide  400 mg Oral Daily  . polyethylene glycol  17 g Oral Daily  .  rivaroxaban  10 mg Oral Q supper  . rOPINIRole  0.5 mg Oral QHS  . saccharomyces boulardii  250 mg Oral Daily  . sertraline  100 mg Oral QHS    Continuous Infusions: .  ceFAZolin (ANCEF) IV Stopped (03/03/17 0554)     LOS: 2 days     Annita Brod, MD [facility] Pager [phone]  If 7PM-7AM, please contact night-coverage [email or web address] Password [facility] 03/03/2017, 8:58 AM

## 2017-03-03 NOTE — Progress Notes (Signed)
Mr. Tavano right foot is finally getting better. It is not as swollen. It is not as red.  He did have the Dopplers done yesterday. It was no acute thrombus noted. He did have the chronic nonocclusive thrombus in the right common femoral vein. Also noted was an age indeterminate thrombus involving the right great saphenous vein.  So far, his cultures are negative. He continues on IV antibiotics with Ancef.  I am not sure as to why he had the neutropenia. His blood counts are responding quite nicely. His white cell count today is 2.8. His absolute neutrophil count is 1000. He is a little more anemic but I would not transfuse him.  He is not yet really walk. Over, he will be able to walk a little today and put some pressure on his right foot.  He's had no nausea or vomiting. He's had no diarrhea. He's had no cough. There's been no mouth sores.  His vital signs all look stable. His temperature is 97.8. Pulse 79. Blood pressure 96/68. His right foot is not as swollen. He has some slight hyperpigmentation about the first MTP joint of the right foot. There is still some tenderness to palpation. No venous cords noted in the right leg. His lungs are clear. Cardiac exam regular rate and rhythm. Abdomen is soft.  Jorge [name] has the cellulitis of the right foot. This is improving. Cultures are negative.   I will continue the IV antibiotics for right now. It will be interesting to see how well he is able to ambulate and put some pressure on the right foot.  The staff on [address] for doing a great job with him.  Lattie Haw, MD  Hebrews 12:12

## 2017-03-04 DIAGNOSIS — C9001 Multiple myeloma in remission: Secondary | ICD-10-CM

## 2017-03-04 LAB — CBC WITH DIFFERENTIAL/PLATELET
BASOS ABS: 0 10*3/uL (ref 0.0–0.1)
Basophils Relative: 1 %
Eosinophils Absolute: 0.2 10*3/uL (ref 0.0–0.7)
Eosinophils Relative: 8 %
HCT: 24.9 % — ABNORMAL LOW (ref 39.0–52.0)
Hemoglobin: 7.9 g/dL — ABNORMAL LOW (ref 13.0–17.0)
LYMPHS ABS: 0.8 10*3/uL (ref 0.7–4.0)
Lymphocytes Relative: 33 %
MCH: 25.7 pg — ABNORMAL LOW (ref 26.0–34.0)
MCHC: 31.7 g/dL (ref 30.0–36.0)
MCV: 81.1 fL (ref 78.0–100.0)
MONOS PCT: 17 %
Monocytes Absolute: 0.4 10*3/uL (ref 0.1–1.0)
Neutro Abs: 1 10*3/uL — ABNORMAL LOW (ref 1.7–7.7)
Neutrophils Relative %: 41 %
PLATELETS: 189 10*3/uL (ref 150–400)
RBC: 3.07 MIL/uL — AB (ref 4.22–5.81)
RDW: 18.1 % — AB (ref 11.5–15.5)
WBC: 2.4 10*3/uL — AB (ref 4.0–10.5)

## 2017-03-04 LAB — COMPREHENSIVE METABOLIC PANEL
ALBUMIN: 2.9 g/dL — AB (ref 3.5–5.0)
ALT: 46 U/L (ref 17–63)
ANION GAP: 5 (ref 5–15)
AST: 50 U/L — ABNORMAL HIGH (ref 15–41)
Alkaline Phosphatase: 72 U/L (ref 38–126)
BILIRUBIN TOTAL: 0.2 mg/dL — AB (ref 0.3–1.2)
BUN: 14 mg/dL (ref 6–20)
CO2: 28 mmol/L (ref 22–32)
Calcium: 8.4 mg/dL — ABNORMAL LOW (ref 8.9–10.3)
Chloride: 103 mmol/L (ref 101–111)
Creatinine, Ser: 0.82 mg/dL (ref 0.61–1.24)
GFR calc Af Amer: >60 mL/min (ref >60)
Glucose, Bld: 110 mg/dL — ABNORMAL HIGH (ref 65–99)
Potassium: 4.3 mmol/L (ref 3.5–5.1)
SODIUM: 136 mmol/L (ref 135–145)
TOTAL PROTEIN: 5.8 g/dL — AB (ref 6.5–8.1)

## 2017-03-04 LAB — CULTURE, BLOOD (SINGLE)

## 2017-03-04 LAB — IRON AND TIBC
IRON: 6 ug/dL — AB (ref 45–182)
Saturation Ratios: 2 % — ABNORMAL LOW (ref 17.9–39.5)
TIBC: 312 ug/dL (ref 250–450)
UIBC: 306 ug/dL

## 2017-03-04 LAB — FERRITIN: FERRITIN: 44 ng/mL (ref 24–336)

## 2017-03-04 NOTE — Progress Notes (Signed)
Jorge [name] rightful excision whole lot better. The swelling has really come down. He says it is still tender in the ankle.  He still is on IV antibiotics. So far, cultures are all negative.  His hemoglobin has dropped steadily. His white cell count is also down a little bit. I believe that this is from the medications. His absolute neutrophil count is 1. This should be adequate for infection control.  His hemoglobin is 7.9. It is continues to drop, we may have to consider transfusing him.  His myeloma spike is only 0.1 g/dL. His IgG level is 516. This is a little on the low side. However, I don't think we have to give him IVIG. He seems to be responding to the IV antibiotics.  He's had no nausea or vomiting. There is not yet walking. I think try to walk yesterday and it was quite painful.  He's had no fever.   On his physical exam, his vital signs show temperature 90.9. Pulse 79. Blood pressure 109/70. Hent exam shows no ocular or oral lesions. There are no palpable cervical or supraclavicular lymph nodes. Lungs are clear bilaterally. Cardiac exam regular rate and rhythm. Abdomen is soft. Has decent bowel sounds. There is no fluid wave. There is no palpable liver or spleen tip. Back exam shows no tenderness over the spine, ribs or hips. Externally shows nice decrease in the swelling of the right foot. He has looks like a lymphangitic streak on the dorsum of the right foot. It is not as tender to touch. There may be some swelling with the lateral malleolus. He has decent range of motion.  Mr. Mcglasson has myeloma. He is status post transplant. He really has had a very good partial remission. He has his cellulitis. His IgG level is slightly depressed but again I don't think that we have to give him IVIG.  We will have to watch his hemoglobin closely. This is a little concerning. If it continues to drop, I will transfuse him.  Hopefully, he might be able to go home and one-2 days. I think this will  be dictated by his activity level.  The staff on [address] are doing a fantastic job with him. I appreciate everybody's help.  Lattie Haw, MD  [place] 7:12

## 2017-03-04 NOTE — Progress Notes (Signed)
PROGRESS NOTE  Jorge [name] BPZ:[id number] DOB: [birth year] DOA: 03/01/2017 PCP: Mackie Pai, PA-C  HPI/Recap of past 24 hours: 61 year old male with past medical history of history of DVT on chronic anticoagulation & multiple myeloma, responding well to treatment admitted on 8/19 for a right foot cellulitis. Symptoms started last week. Had received 2 doses of IV daptomycin and then put on by mouth antibiotics, but actually no improvement. Found to be neutropenic with white blood cell count of 2.0 and hematology consulted. Patient started on IV Ancef.  Subjective -Pain and swelling finally starting to improve  Assessment/Plan: Principal Problem:   Cellulitis right foot:  -Clinically improving although slowly  -Continue IV Ancef  -Stop IV fluids -Elevate right foot    Multiple myeloma ([place]): On low-dose Revlimid. -Follow-up with Dr. Marin Olp   Anemia -Secondary to multiple myeloma and Revlimid Hemoglobin trending down slightly -Transfuse if drops any further, check CBC in a.m.-    Neutropenia -Likely secondary to multiple myeloma/Revlimid -Monitor -Appreciate oncology assistance    History of DVT: Continue xarelto. -Doppler notes chronic DVT in right lower extremity  Code Status: Full code   Family Communication: Left message with wife  Disposition Plan: Home tomorrow if stable   Consultants:  Hematology   Procedures:  None   Antimicrobials:  IV Cubicin and Augmentin prior to admission  IV Ancef 8/19-present   DVT prophylaxis: On xarelto   Objective: Vitals:   03/03/17 0558 03/03/17 1503 03/03/17 2039 03/04/17 0408  BP: 96/68 114/75 113/67 109/70  Pulse: 79 81 89 79  Resp: '15 18 18 20  ' Temp: 97.8 F (36.6 C) 98.9 F (37.2 C) 99.7 F (37.6 C) 99 F (37.2 C)  TempSrc: Oral Oral Oral Oral  SpO2: 95% 96% 98% 98%  Weight:      Height:        Intake/Output Summary (Last 24 hours) at 03/04/17 1306 Last data filed at 03/04/17 0815  Gross per  24 hour  Intake              720 ml  Output             1500 ml  Net             -780 ml   Filed Weights   03/01/17 1430  Weight: 74.4 kg (164 lb 0.4 oz)    Exam:   General:  AAOx3 , no distress  HEENT: Normocephalic and atraumatic, mucous membranes are moist  Neck: Supple, no JVD   Cardiovascular: Regular rate and rhythm, S1-S2/  Respiratory: Decreased breath sounds at the bases  Abdomen: Soft, nontender, nondistended, positive bowel sounds   Musculoskeletal: Right foot/ankle with erythema edema and tenderness/improving   Skin: -Right foot as noted above  Psychiatry: Patient appropriate, no evidence of psychoses   Data Reviewed: CBC:  Recent Labs Lab 03/01/17 0953 03/02/17 0737 03/03/17 0333 03/04/17 0315  WBC 2.0* 2.4* 2.8* 2.4*  NEUTROABS 0.5* 0.9* 1.0* 1.0*  HGB 9.7* 8.5* 8.2* 7.9*  HCT 29.8* 27.0* 25.2* 24.9*  MCV 81.0 80.8 80.8 81.1  PLT 193 181 155 242   Basic Metabolic Panel:  Recent Labs Lab 03/01/17 0953 03/02/17 0731 03/04/17 0315  NA 138 136 136  K 4.0 4.0 4.3  CL 105 102 103  CO2 '26 27 28  ' GLUCOSE 110* 104* 110*  BUN '13 11 14  ' CREATININE 0.82 0.82 0.82  CALCIUM 9.0 8.5* 8.4*   GFR: Estimated Creatinine Clearance: 88.4 mL/min (by C-G formula based on SCr of  0.82 mg/dL). Liver Function Tests:  Recent Labs Lab 03/01/17 0953 03/04/17 0315  AST 23 50*  ALT 31 46  ALKPHOS 62 72  BILITOT 0.5 0.2*  PROT 6.6 5.8*  ALBUMIN 3.6 2.9*   No results for input(s): LIPASE, AMYLASE in the last 168 hours. No results for input(s): AMMONIA in the last 168 hours. Coagulation Profile: No results for input(s): INR, PROTIME in the last 168 hours. Cardiac Enzymes: No results for input(s): CKTOTAL, CKMB, CKMBINDEX, TROPONINI in the last 168 hours. BNP (last 3 results) No results for input(s): PROBNP in the last 8760 hours. HbA1C: No results for input(s): HGBA1C in the last 72 hours. CBG: No results for input(s): GLUCAP in the last 168  hours. Lipid Profile: No results for input(s): CHOL, HDL, LDLCALC, TRIG, CHOLHDL, LDLDIRECT in the last 72 hours. Thyroid Function Tests: No results for input(s): TSH, T4TOTAL, FREET4, T3FREE, THYROIDAB in the last 72 hours. Anemia Panel: No results for input(s): VITAMINB12, FOLATE, FERRITIN, TIBC, IRON, RETICCTPCT in the last 72 hours. Urine analysis:    Component Value Date/Time   COLORURINE YELLOW 03/01/2017 0956   APPEARANCEUR CLEAR 03/01/2017 0956   LABSPEC 1.013 03/01/2017 0956   PHURINE 5.0 03/01/2017 0956   GLUCOSEU NEGATIVE 03/01/2017 0956   HGBUR SMALL (A) 03/01/2017 0956   BILIRUBINUR NEGATIVE 03/01/2017 0956   KETONESUR NEGATIVE 03/01/2017 0956   PROTEINUR NEGATIVE 03/01/2017 0956   NITRITE NEGATIVE 03/01/2017 0956   LEUKOCYTESUR NEGATIVE 03/01/2017 0956   Sepsis Labs: '@LABRCNTIP' (procalcitonin:4,lacticidven:4)  ) Recent Results (from the past 240 hour(s))  Culture, Blood     Status: None (Preliminary result)   Collection Time: 02/26/17  2:42 PM  Result Value Ref Range Status   BLOOD CULTURE, ROUTINE Preliminary report  Preliminary   Organism ID, Bacteria Comment  Preliminary    Comment: No growth in 36 - 48 hours.  Culture, Blood     Status: None (Preliminary result)   Collection Time: 02/26/17  2:43 PM  Result Value Ref Range Status   BLOOD CULTURE, ROUTINE Preliminary report  Preliminary   Organism ID, Bacteria Comment  Preliminary    Comment: No growth in 36 - 48 hours.  Culture, blood (routine x 2)     Status: None (Preliminary result)   Collection Time: 03/01/17  3:13 PM  Result Value Ref Range Status   Specimen Description BLOOD RIGHT ARM  Final   Special Requests   Final    BOTTLES DRAWN AEROBIC ONLY Blood Culture adequate volume   Culture   Final    NO GROWTH 3 DAYS Performed at [facility], [address], [place], [place] [address]    Report Status PENDING  Incomplete  Culture, blood (routine x 2)     Status: None (Preliminary result)    Collection Time: 03/01/17  3:13 PM  Result Value Ref Range Status   Specimen Description BLOOD RIGHT FOREARM  Final   Special Requests   Final    BOTTLES DRAWN AEROBIC ONLY Blood Culture adequate volume   Culture   Final    NO GROWTH 3 DAYS Performed at [facility], [address], [place], [place] [address]    Report Status PENDING  Incomplete      Studies: No results found.  Scheduled Meds: . cholecalciferol  5,000 Units Oral Daily  . famciclovir  500 mg Oral Daily  . fentaNYL  25 mcg Transdermal Q48H  . magnesium oxide  400 mg Oral Daily  . polyethylene glycol  17  g Oral Daily  . rivaroxaban  10 mg Oral Q supper  . rOPINIRole  0.5 mg Oral QHS  . saccharomyces boulardii  250 mg Oral Daily  . sertraline  100 mg Oral QHS    Continuous Infusions: .  ceFAZolin (ANCEF) IV Stopped (03/04/17 2669)     LOS: 3 days     Domenic Polite, MD [facility] Page via Shea Evans, password [email or web address] If 7PM-7AM, please contact night-coverage [email or web address] Password [email or web address] 03/04/2017, 1:06 PM

## 2017-03-05 DIAGNOSIS — D500 Iron deficiency anemia secondary to blood loss (chronic): Secondary | ICD-10-CM

## 2017-03-05 DIAGNOSIS — L03115 Cellulitis of right lower limb: Principal | ICD-10-CM

## 2017-03-05 DIAGNOSIS — E611 Iron deficiency: Secondary | ICD-10-CM

## 2017-03-05 LAB — CBC WITH DIFFERENTIAL/PLATELET
BASOS PCT: 2 %
Basophils Absolute: 0.1 10*3/uL (ref 0.0–0.1)
EOS PCT: 9 %
Eosinophils Absolute: 0.2 10*3/uL (ref 0.0–0.7)
HCT: 25.9 % — ABNORMAL LOW (ref 39.0–52.0)
Hemoglobin: 8.1 g/dL — ABNORMAL LOW (ref 13.0–17.0)
LYMPHS ABS: 0.8 10*3/uL (ref 0.7–4.0)
Lymphocytes Relative: 30 %
MCH: 25.3 pg — ABNORMAL LOW (ref 26.0–34.0)
MCHC: 31.3 g/dL (ref 30.0–36.0)
MCV: 80.9 fL (ref 78.0–100.0)
MONO ABS: 0.6 10*3/uL (ref 0.1–1.0)
Monocytes Relative: 21 %
Neutro Abs: 1 10*3/uL — ABNORMAL LOW (ref 1.7–7.7)
Neutrophils Relative %: 38 %
PLATELETS: 218 10*3/uL (ref 150–400)
RBC: 3.2 MIL/uL — ABNORMAL LOW (ref 4.22–5.81)
RDW: 18.1 % — ABNORMAL HIGH (ref 11.5–15.5)
WBC: 2.7 10*3/uL — ABNORMAL LOW (ref 4.0–10.5)

## 2017-03-05 LAB — TYPE AND SCREEN
ABO/RH(D): B POS
Antibody Screen: POSITIVE
DAT, IGG: NEGATIVE
PT AG Type: NEGATIVE

## 2017-03-05 LAB — RETICULOCYTES
RBC.: 3.2 MIL/uL — ABNORMAL LOW (ref 4.22–5.81)
Retic Count, Absolute: 35.2 10*3/uL (ref 19.0–186.0)
Retic Ct Pct: 1.1 % (ref 0.4–3.1)

## 2017-03-05 MED ORDER — SODIUM CHLORIDE 0.9 % IV SOLN
510.0000 mg | Freq: Once | INTRAVENOUS | Status: AC
  Administered 2017-03-05: 510 mg via INTRAVENOUS
  Filled 2017-03-05: qty 17

## 2017-03-05 MED ORDER — CEPHALEXIN 250 MG PO CAPS: 250.0000 mg | ORAL_CAPSULE | Freq: Four times a day (QID) | ORAL | 0 refills | Status: DC

## 2017-03-05 MED ORDER — SODIUM CHLORIDE 0.9 % IV SOLN
40.0000 mg | Freq: Once | INTRAVENOUS | Status: AC
  Administered 2017-03-05: 40 mg via INTRAVENOUS
  Filled 2017-03-05: qty 4

## 2017-03-05 NOTE — Progress Notes (Signed)
Patient discharged to home with family, discharge instructions reviewed with patient who verbalized understanding. New RX given to patient. 

## 2017-03-05 NOTE — Progress Notes (Signed)
Jorge [name] was able to walk a low bit yesterday. He put a little bit more weight on the right foot. It was still tender.  He is still on the IV antibiotics. He is on Ancef. Cultures are negative.  His appetite is not that great. He much rather have food at home.  His CBC today shows a white cell count of 2.7. Hemoglobin 8.1. Platelet count 218,000.  His iron studies show obvious iron deficiency. I will give him a dose of Feraheme.  His vital signs all looks stable. His temperature is 98.7. His blood pressure 118/78. Pulse is 79. His right foot is barely swollen. I really don't see much in the way of redness. He does have some tenderness in the lateral malleolus. He has good pulses. Lungs are clear. Cardiac exam is regular rate and rhythm. Oral exam shows no mucositis.  Maybe, Jorge [name] can be put on oral antibiotics. Again he would love to be able to go home.  I will give him a dose of IV iron.  I appreciate all the help that he is getting. I think that he will do well at home.  His white cell count has come up.  Lattie Haw, MD

## 2017-03-06 ENCOUNTER — Other Ambulatory Visit: Payer: 59

## 2017-03-06 ENCOUNTER — Telehealth: Payer: Self-pay | Admitting: Behavioral Health

## 2017-03-06 LAB — CULTURE, BLOOD (ROUTINE X 2)
Culture: NO GROWTH
Culture: NO GROWTH
Special Requests: ADEQUATE
Special Requests: ADEQUATE

## 2017-03-06 NOTE — Telephone Encounter (Signed)
Patient voiced that he's first going to follow-up with [name] office, but if he sees that a visit is necessary with PCP afterwards, then he will make an appointment. He declines TCM/Hospital follow-up at this time.

## 2017-03-09 ENCOUNTER — Other Ambulatory Visit: Payer: Self-pay | Admitting: *Deleted

## 2017-03-09 DIAGNOSIS — C9001 Multiple myeloma in remission: Secondary | ICD-10-CM

## 2017-03-09 NOTE — Discharge Summary (Signed)
Physician Discharge Summary  Jorge [name] MRN:[id number] DOB: 05/23/1956 DOA: 03/01/2017  PCP: Saguier, Edward, PA-C  Admit date: 03/01/2017 Discharge date: 03/05/2017  Time spent: 35 minutes  Recommendations for Outpatient Follow-up:  1. PCP in 1 week 2. Dr.Ennever in 1-2weeks, please monitor CBC in 1 week   Discharge Diagnoses:  Principal Problem:   Cellulitis Active Problems:   Multiple myeloma (HCC)   Neutropenia (HCC)   Iron deficiency anemia due to chronic blood loss   H/o DVT  Discharge Condition: stable  Diet recommendation:regular  Filed Weights   03/01/17 1430  Weight: 74.4 kg (164 lb 0.4 oz)    History of present illness:  61-year-old male with past medical history of history of DVT on chronic anticoagulation & multiple myeloma, responding well to treatment admitted on 8/19 for a right foot cellulitis. Symptoms started last week. Had received 2 doses of IV daptomycin and then put on by mouth antibiotics, but actually no improvement. Found to be neutropenic with white blood cell count of 2.0 and hematology consulted  Hospital Course:    Cellulitis right foot:  -Clinically improving with IV Ancef, elevation and supportive care -Blood Cx negative -discharged home on PO Keflex to complete course    Multiple myeloma (HCC): On low-dose Revlimid. -Follow-up with Dr. Ennever   Anemia -Secondary to multiple myeloma and Revlimid -stable now    Neutropenia -Likely secondary to multiple myeloma/Revlimid -Appreciate oncology assistance -WBC improved to 2.7 today, and ANC >1400 hence no longer neuropenic    History of DVT: Continue xarelto. -Doppler notes chronic DVT in right lower extremity   Consultations:  Onc dr.Ennever  Discharge Exam: Vitals:   03/04/17 2055 03/05/17 0458  BP: 109/65 118/78  Pulse: 87 79  Resp: 18 18  Temp: 98.9 F (37.2 C) 98.7 F (37.1 C)  SpO2: 99% 98%    General: AAOx3 Cardiovascular: S1S2/RRR Respiratory:  CTAB  Discharge Instructions   Discharge Instructions    Diet - low sodium heart healthy    Complete by:  As directed    Increase activity slowly    Complete by:  As directed      Discharge Medication List as of 03/05/2017 10:26 AM    START taking these medications   Details  cephALEXin (KEFLEX) 250 MG capsule Take 1 capsule (250 mg total) by mouth 4 (four) times daily. For 5days, Starting Thu 03/05/2017, Print      CONTINUE these medications which have NOT CHANGED   Details  Cholecalciferol (VITAMIN D-3) 5000 units TABS Take 5,000 mg by mouth daily., Historical Med    famciclovir (FAMVIR) 500 MG tablet Take 1 tablet (500 mg total) by mouth daily., Starting Fri 10/10/2016, Print    fentaNYL (DURAGESIC - DOSED MCG/HR) 25 MCG/HR patch Place 1 patch (25 mcg total) onto the skin every other day., Starting Mon 02/16/2017, Print    lidocaine (LIDODERM) 5 % PLAEC 1 PATCH ONTO THE SKIN DAILY.APPLY 1 PATCH TO THE MOST PAINFUL AREA FOR 12 HR IN A 24 HR PERIOD, Historical Med    magnesium oxide (MAG-OX) 400 MG tablet Take 400 mg by mouth daily., Historical Med    Oxycodone HCl 10 MG TABS Take 1 tablet (10 mg total) by mouth every 4 (four) hours as needed., Starting Fri 05/30/2016, Print    polyethylene glycol powder (MIRALAX) powder Take 17 g by mouth daily., Starting Wed 03/26/2016, Normal    Probiotic Product (PROBIOTIC PO) Take 1 capsule by mouth daily., Historical Med    rivaroxaban (XARELTO) 10   MG TABS tablet Take 1 tablet (10 mg total) by mouth daily with supper., Starting Wed 01/28/2017, Normal    rOPINIRole (REQUIP) 0.25 MG tablet Take one tablet 1-3hours before bed for two days and then increase to two tablets 1-3hours before bed daily., Normal    senna (SENOKOT) 8.6 MG TABS tablet Take 2 tablets (17.2 mg total) by mouth daily., Starting Wed 05/07/2016, Normal    sertraline (ZOLOFT) 100 MG tablet Take 100 mg by mouth at bedtime. , Starting Wed 01/02/2016, Historical Med    zolpidem  (AMBIEN) 10 MG tablet Take 1 tablet (10 mg total) by mouth at bedtime as needed., Starting Thu 12/11/2016, Phone In    ondansetron (ZOFRAN-ODT) 8 MG disintegrating tablet Take 1 tablet (8 mg total) by mouth every 8 (eight) hours as needed., Starting Fri 09/12/2016, Normal      STOP taking these medications     amoxicillin-clavulanate (AUGMENTIN) 875-125 MG tablet      lenalidomide (REVLIMID) 10 MG capsule      carisoprodol (SOMA) 350 MG tablet      HYDROcodone-acetaminophen (NORCO) 10-325 MG tablet      REVLIMID 10 MG capsule      REVLIMID 10 MG capsule        No Known Allergies Follow-up Information    Saguier, Edward, PA-C. Schedule an appointment as soon as possible for a visit in 1 week(s).   Specialties:  Internal Medicine, Family Medicine Contact information: [address] [place] [address] [phone]        Ennever, Peter R, MD. Schedule an appointment as soon as possible for a visit in 2 week(s).   Specialty:  Oncology Contact information: [address] [place] [address] [phone]            The results of significant diagnostics from this hospitalization (including imaging, microbiology, ancillary and laboratory) are listed below for reference.    Significant Diagnostic Studies: Ct Foot Right W Contrast  Result Date: 02/26/2017 CLINICAL DATA:  Pain at the base of the right first toe since Saturday. Swelling, redness of the toe extending into the midfoot. EXAM: CT OF THE LOWER RIGHT EXTREMITY WITH CONTRAST TECHNIQUE: Multidetector CT imaging of the lower right extremity was performed according to the standard protocol following intravenous contrast administration. COMPARISON:  None. CONTRAST:  100mL ISOVUE-300 IOPAMIDOL (ISOVUE-300) INJECTION 61% FINDINGS: Bones/Joint/Cartilage No fracture or dislocation. Normal alignment. No joint effusion. No erosive changes. No periosteal reaction or bone destruction. No  osteolysis. Ligaments Ligaments are suboptimally evaluated by CT. Muscles and Tendons Muscles are normal. No muscle atrophy. Flexor, extensor, peroneal and Achilles tendons are intact. Soft tissue No fluid collection or hematoma. No soft tissue mass. Nonspecific edema in the subcutaneous fat along the lateral aspect of the foot. IMPRESSION: 1.  No acute osseous abnormality of the right foot. 2. Nonspecific edema in the subcutaneous fat along the lateral aspect of the foot. This may reflect cellulitis versus reactive edema secondary to etiology such as venous insufficiency. Electronically Signed   By: Hetal  Patel   On: 02/26/2017 12:45    Microbiology: Recent Results (from the past 240 hour(s))  Culture, blood (routine x 2)     Status: None   Collection Time: 03/01/17  3:13 PM  Result Value Ref Range Status   Specimen Description BLOOD RIGHT ARM  Final   Special Requests   Final    BOTTLES DRAWN AEROBIC ONLY Blood Culture adequate volume   Culture     Final    NO GROWTH 5 DAYS Performed at [facility], [place] [address] [place], [place] [address]    Report Status 03/06/2017 FINAL  Final  Culture, blood (routine x 2)     Status: None   Collection Time: 03/01/17  3:13 PM  Result Value Ref Range Status   Specimen Description BLOOD RIGHT FOREARM  Final   Special Requests   Final    BOTTLES DRAWN AEROBIC ONLY Blood Culture adequate volume   Culture   Final    NO GROWTH 5 DAYS Performed at [facility], [place] [address] [place], [place] [address]    Report Status 03/06/2017 FINAL  Final     Labs: Basic Metabolic Panel:  Recent Labs Lab 03/04/17 0315  NA 136  K 4.3  CL 103  CO2 28  GLUCOSE 110*  BUN 14  CREATININE 0.82  CALCIUM 8.4*   Liver Function Tests:  Recent Labs Lab 03/04/17 0315  AST 50*  ALT 46  ALKPHOS 72  BILITOT 0.2*  PROT 5.8*  ALBUMIN 2.9*   No results for input(s): LIPASE, AMYLASE in the last 168 hours. No results for input(s): AMMONIA in the  last 168 hours. CBC:  Recent Labs Lab 03/03/17 0333 03/04/17 0315 03/05/17 0320  WBC 2.8* 2.4* 2.7*  NEUTROABS 1.0* 1.0* 1.0*  HGB 8.2* 7.9* 8.1*  HCT 25.2* 24.9* 25.9*  MCV 80.8 81.1 80.9  PLT 155 189 218   Cardiac Enzymes: No results for input(s): CKTOTAL, CKMB, CKMBINDEX, TROPONINI in the last 168 hours. BNP: BNP (last 3 results) No results for input(s): BNP in the last 8760 hours.  ProBNP (last 3 results) No results for input(s): PROBNP in the last 8760 hours.  CBG: No results for input(s): GLUCAP in the last 168 hours.     SignedDomenic Polite MD.  [facility] 03/09/2017, 4:20 PM

## 2017-03-10 ENCOUNTER — Ambulatory Visit: Payer: 59 | Admitting: Physical Therapy

## 2017-03-10 ENCOUNTER — Other Ambulatory Visit (HOSPITAL_BASED_OUTPATIENT_CLINIC_OR_DEPARTMENT_OTHER): Payer: 59

## 2017-03-10 ENCOUNTER — Ambulatory Visit (HOSPITAL_BASED_OUTPATIENT_CLINIC_OR_DEPARTMENT_OTHER): Payer: 59 | Admitting: Family

## 2017-03-10 VITALS — BP 117/66 | HR 71 | Temp 98.3°F | Resp 16 | Wt 157.0 lb

## 2017-03-10 DIAGNOSIS — D509 Iron deficiency anemia, unspecified: Secondary | ICD-10-CM

## 2017-03-10 DIAGNOSIS — R293 Abnormal posture: Secondary | ICD-10-CM

## 2017-03-10 DIAGNOSIS — C9000 Multiple myeloma not having achieved remission: Secondary | ICD-10-CM

## 2017-03-10 DIAGNOSIS — L03115 Cellulitis of right lower limb: Secondary | ICD-10-CM

## 2017-03-10 DIAGNOSIS — D500 Iron deficiency anemia secondary to blood loss (chronic): Secondary | ICD-10-CM

## 2017-03-10 DIAGNOSIS — C9001 Multiple myeloma in remission: Secondary | ICD-10-CM

## 2017-03-10 DIAGNOSIS — M546 Pain in thoracic spine: Secondary | ICD-10-CM

## 2017-03-10 DIAGNOSIS — R252 Cramp and spasm: Secondary | ICD-10-CM | POA: Diagnosis present

## 2017-03-10 DIAGNOSIS — M6281 Muscle weakness (generalized): Secondary | ICD-10-CM

## 2017-03-10 LAB — CBC WITH DIFFERENTIAL (CANCER CENTER ONLY)
BASO#: 0.1 10*3/uL (ref 0.0–0.2)
BASO%: 1.9 % (ref 0.0–2.0)
EOS%: 4.1 % (ref 0.0–7.0)
Eosinophils Absolute: 0.1 10*3/uL (ref 0.0–0.5)
HEMATOCRIT: 32.7 % — AB (ref 38.7–49.9)
HGB: 10 g/dL — ABNORMAL LOW (ref 13.0–17.1)
LYMPH#: 0.8 10*3/uL — AB (ref 0.9–3.3)
LYMPH%: 31 % (ref 14.0–48.0)
MCH: 27 pg — ABNORMAL LOW (ref 28.0–33.4)
MCHC: 30.6 g/dL — ABNORMAL LOW (ref 32.0–35.9)
MCV: 88 fL (ref 82–98)
MONO#: 0.4 10*3/uL (ref 0.1–0.9)
MONO%: 13.8 % — ABNORMAL HIGH (ref 0.0–13.0)
NEUT#: 1.3 10*3/uL — ABNORMAL LOW (ref 1.5–6.5)
NEUT%: 49.2 % (ref 40.0–80.0)
PLATELETS: 260 10*3/uL (ref 145–400)
RBC: 3.7 10*6/uL — AB (ref 4.20–5.70)
RDW: 20.9 % — AB (ref 11.1–15.7)
WBC: 2.7 10*3/uL — ABNORMAL LOW (ref 4.0–10.0)

## 2017-03-10 LAB — CMP (CANCER CENTER ONLY)
ALK PHOS: 97 U/L — AB (ref 26–84)
ALT: 34 U/L (ref 10–47)
AST: 27 U/L (ref 11–38)
Albumin: 3.3 g/dL (ref 3.3–5.5)
BILIRUBIN TOTAL: 0.5 mg/dL (ref 0.20–1.60)
BUN: 14 mg/dL (ref 7–22)
CALCIUM: 9.5 mg/dL (ref 8.0–10.3)
CO2: 30 mEq/L (ref 18–33)
Chloride: 106 mEq/L (ref 98–108)
Creat: 1.1 mg/dl (ref 0.6–1.2)
GLUCOSE: 98 mg/dL (ref 73–118)
Potassium: 4.3 mEq/L (ref 3.3–4.7)
Sodium: 144 mEq/L (ref 128–145)
Total Protein: 7.2 g/dL (ref 6.4–8.1)

## 2017-03-10 LAB — IRON AND TIBC
%SAT: 18 % — ABNORMAL LOW (ref 20–55)
IRON: 63 ug/dL (ref 42–163)
TIBC: 355 ug/dL (ref 202–409)
UIBC: 292 ug/dL (ref 117–376)

## 2017-03-10 LAB — FERRITIN: Ferritin: 349 ng/ml — ABNORMAL HIGH (ref 22–316)

## 2017-03-10 NOTE — Progress Notes (Signed)
Hematology and Oncology Follow Up Visit  Jorge [name] [id number] 24-Mar-1956 61 y.o. 03/10/2017   Principle Diagnosis:  IgG Kappa myeloma - Hyperdiploid/+11 DVT of the LEFT and RIGHT leg   Current Therapy:   Revlimid 10 mg po q day - on hold due to cellulitis in foot, will old one more week and restart Zometa 4 mg IV q 3 months- next dose is 05/2017 Xarelto 10 mg PO daily   Interim History:  Jorge [name] is here today for post hospital follow-up. He is doing well and the cellulitis in his right foot continues to improve. He still has some tenderness in the right great toe and puffiness around the ankle. The pitting edema and erythema has resolved. He finishes his Keflex prescription today.  No other swelling or tenderness in is extremities.  He denies having had any fever, chills, n/v, cough, rash, dizziness, SOB, chest pain, palpitations, abdominal pain or changes in bladder habits. His still has been a little loose with the antibiotics but tolerable.  The neuropathy in his feet and hands is much better and tingling only on occasion.  No bruising or petechiae. No lymphadenopathy found on exam.  His appetite is improving and he plans to start adding protein shakes daily to his diet. He is staying hydrated. His weight is down 3 lbs but he is making changes to his diet to help with this.   ECOG Performance Status: 1 - Symptomatic but completely ambulatory  Medications:  Allergies as of 03/10/2017   No Known Allergies     Medication List       Accurate as of 03/10/17 11:33 AM. Always use your most recent med list.          cephALEXin 250 MG capsule Commonly known as:  KEFLEX Take 1 capsule (250 mg total) by mouth 4 (four) times daily. For 5days   famciclovir 500 MG tablet Commonly known as:  FAMVIR Take 1 tablet (500 mg total) by mouth daily.   fentaNYL 25 MCG/HR patch Commonly known as:  DURAGESIC - dosed mcg/hr Place 1 patch (25 mcg total) onto the skin every other day.   lidocaine 5 % Commonly known as:  LIDODERM PLAEC 1 PATCH ONTO THE SKIN DAILY.APPLY 1 PATCH TO THE MOST PAINFUL AREA FOR 12 HR IN A 24 HR PERIOD   magnesium oxide 400 MG tablet Commonly known as:  MAG-OX Take 400 mg by mouth daily.   MITIGARE 0.6 MG Caps Generic drug:  Colchicine TAKE 1 CAPSULE (0.6 MG TOTAL) BY MOUTH DAILY. FIRST DAY TAKW 1 CAPSULE EVERY 8 HOURS.   ondansetron 8 MG disintegrating tablet Commonly known as:  ZOFRAN-ODT Take 1 tablet (8 mg total) by mouth every 8 (eight) hours as needed.   Oxycodone HCl 10 MG Tabs Take 1 tablet (10 mg total) by mouth every 4 (four) hours as needed.   polyethylene glycol powder powder Commonly known as:  MIRALAX Take 17 g by mouth daily.   PROBIOTIC PO Take 1 capsule by mouth daily.   rivaroxaban 10 MG Tabs tablet Commonly known as:  XARELTO Take 1 tablet (10 mg total) by mouth daily with supper.   rOPINIRole 0.25 MG tablet Commonly known as:  REQUIP Take one tablet 1-3hours before bed for two days and then increase to two tablets 1-3hours before bed daily.   senna 8.6 MG Tabs tablet Commonly known as:  SENOKOT Take 2 tablets (17.2 mg total) by mouth daily.   sertraline 100 MG tablet Commonly known as:  ZOLOFT Take 100 mg by mouth at bedtime.   Vitamin D-3 5000 units Tabs Take 5,000 mg by mouth daily.   zolpidem 10 MG tablet Commonly known as:  AMBIEN Take 1 tablet (10 mg total) by mouth at bedtime as needed.       Allergies: No Known Allergies  Past Medical History, Surgical history, Social history, and Family History were reviewed and updated.  Review of Systems: All other 10 point review of systems is negative.   Physical Exam:  weight is 157 lb (71.2 kg). His oral temperature is 98.3 F (36.8 C). His blood pressure is 117/66 and his pulse is 71. His respiration is 16 and oxygen saturation is 99%.   Wt Readings from Last 3 Encounters:  03/10/17 157 lb (71.2 kg)  03/01/17 164 lb 0.4 oz (74.4 kg)    02/18/17 160 lb (72.6 kg)    Ocular: Sclerae unicteric, pupils equal, round and reactive to light Ear-nose-throat: Oropharynx clear, dentition fair Lymphatic: No cervical, supraclavicular or axillary adenopathy Lungs no rales or rhonchi, good excursion bilaterally Heart regular rate and rhythm, no murmur appreciated Abd soft, nontender, positive bowel sounds, no liver or spleen tip palpated on exam, no fluid wave  MSK no focal spinal tenderness, no joint edema Neuro: non-focal, well-oriented, appropriate affect Breasts: Deferred   Lab Results  Component Value Date   WBC 2.7 (L) 03/10/2017   HGB 10.0 (L) 03/10/2017   HCT 32.7 (L) 03/10/2017   MCV 88 03/10/2017   PLT 260 03/10/2017   Lab Results  Component Value Date   FERRITIN 44 03/04/2017   IRON 6 (L) 03/04/2017   TIBC 312 03/04/2017   UIBC 306 03/04/2017   IRONPCTSAT 2 (L) 03/04/2017   Lab Results  Component Value Date   RETICCTPCT 1.1 03/05/2017   RBC 3.70 (L) 03/10/2017   Lab Results  Component Value Date   KAPLAMBRATIO 1.90 (H) 01/05/2017   Lab Results  Component Value Date   IGGSERUM 516 (L) 03/02/2017   IGA 106 03/02/2017   IGMSERUM 30 03/02/2017   Lab Results  Component Value Date   TOTALPROTELP 5.6 (L) 03/02/2017   MSPIKE 0.5 (H) 07/28/2016     Chemistry      Component Value Date/Time   NA 144 03/10/2017 0953   NA 140 10/03/2016 0845   K 4.3 03/10/2017 0953   K 4.3 10/03/2016 0845   CL 106 03/10/2017 0953   CO2 30 03/10/2017 0953   CO2 26 10/03/2016 0845   BUN 14 03/10/2017 0953   BUN 11.9 10/03/2016 0845   CREATININE 1.1 03/10/2017 0953   CREATININE 0.9 10/03/2016 0845      Component Value Date/Time   CALCIUM 9.5 03/10/2017 0953   CALCIUM 9.9 10/03/2016 0845   ALKPHOS 97 (H) 03/10/2017 0953   ALKPHOS 80 10/03/2016 0845   AST 27 03/10/2017 0953   AST 28 10/03/2016 0845   ALT 34 03/10/2017 0953   ALT 26 10/03/2016 0845   BILITOT 0.50 03/10/2017 0953   BILITOT 0.48 10/03/2016 0845       Impression and Plan: Jorge [name] is a pleasant 61 yo caucasian gentleman with IgG kappa myeloma and autologous stem cell transplant in February 2018.  His right lower extremity cellulitis is much improved. He completes his Keflex today.  We will have him hold his Revlimid 1 more week before resuming.  He will be having his teeth cleaned later this week and will take Amoxacillin prior to.  We will plan to see  him back on 9/25 for follow-up and lab.  Both he and his wife know to contact our office with any questions or concerns. We can certainly see him sooner if need be.   Eliezer Bottom, NP 8/28/201811:33 AM

## 2017-03-10 NOTE — Therapy (Signed)
[facility] [address]  [place], [place], [address] Phone: [phone]   Fax:  [phone]  Physical Therapy Treatment  Patient Details  Name: Jorge [name] MRN: [id number] Date of Birth: 17-Nov-1955 Referring Provider: Durenda Age, MD  Encounter Date: 03/10/2017      PT End of Session - 03/10/17 1535    Visit Number 13   Number of Visits 16   Authorization Type [employer]   PT Start Time 3762   PT Stop Time 1630   PT Time Calculation (min) 55 min   Activity Tolerance Patient tolerated treatment well   Behavior During Therapy [facility] for tasks assessed/performed      Past Medical History:  Diagnosis Date  . Anxiety   . Bone metastasis ([place])   . Chest cold 05/19/2016   productive cough  -- started on antibiotic  . Chronic back pain    due to bone mets from myeloma  . Cough   . Depression   . GERD (gastroesophageal reflux disease)   . Hiatal hernia   . History of chicken pox   . History of concussion    age 61 -- no residual  . History of DVT of lower extremity 03/21/2016  treated and completed w/ xarelto   per doppler left extensive occlusion common femoral, femoral, and popliteal veins and right partial occlusion common femoral and profunda femoral veins/  last doppler 06-04-2016 no evidence acute or chronic dvt noted either leg   . History of radiation therapy 06/09/16-06/23/16   lower thoracic spine 25 Gy in 10 fractions  . Mouth ulcers    secondary to radiation  . Multiple myeloma ([place]) dx 02/22/2016 via bone marrow bx---  oncologist-  [name]   IgG Kappa-- Hyperdiploid/ +11 w/ bone mets--  current treatment chemotherapy (started 08/ 2017)and pallitive radiation to back started 06-09-2016  . Renal calculus, right   . Wears contact lenses     Past Surgical History:  Procedure Laterality Date  . COLONOSCOPY  [id number]  . CYSTOSCOPY W/ URETERAL STENT PLACEMENT Right 06/20/2016   Procedure:  CYSTOSCOPY WITH STENT REPLACEMENT;  Surgeon: Kathie Rhodes, MD;  Location: [facility];  Service: Urology;  Laterality: Right;  . CYSTOSCOPY WITH RETROGRADE PYELOGRAM, URETEROSCOPY AND STENT PLACEMENT Right 05/30/2016   Procedure: CYSTOSCOPY WITH RETROGRADE PYELOGRAM, URETEROSCOPY AND STENT PLACEMENT,DILITATION URETERAL STRICTURE;  Surgeon: Kathie Rhodes, MD;  Location: [facility];  Service: Urology;  Laterality: Right;  . CYSTOSCOPY/RETROGRADE/URETEROSCOPY/STONE EXTRACTION WITH BASKET Right 06/20/2016   Procedure: CYSTOSCOPY/URETEROSCOPY/STONE EXTRACTION WITH BASKET;  Surgeon: Kathie Rhodes, MD;  Location: [facility];  Service: Urology;  Laterality: Right;  . HOLMIUM LASER APPLICATION Right 83/07/5174   Procedure: HOLMIUM LASER APPLICATION;  Surgeon: Kathie Rhodes, MD;  Location: [facility];  Service: Urology;  Laterality: Right;  . IR GENERIC HISTORICAL  02/11/2016   IR RADIOLOGIST EVAL & MGMT 02/11/2016 [facility]  . IR GENERIC HISTORICAL  02/15/2016   IR BONE TUMOR(S)RF ABLATION 02/15/2016 Luanne Bras, MD [facility]  . IR GENERIC HISTORICAL  02/15/2016   IR BONE TUMOR(S)RF ABLATION 02/15/2016 Luanne Bras, MD [facility]  . IR GENERIC HISTORICAL  02/15/2016   IR BONE TUMOR(S)RF ABLATION 02/15/2016 Luanne Bras, MD [facility]  . IR GENERIC HISTORICAL  02/15/2016   IR KYPHO THORACIC WITH BONE BIOPSY 02/15/2016 Luanne Bras, MD [facility]  . IR GENERIC HISTORICAL  02/15/2016   IR KYPHO THORACIC WITH BONE BIOPSY 02/15/2016 Luanne Bras,  MD [facility]  . IR GENERIC HISTORICAL  02/15/2016   IR VERTEBROPLASTY CERV/THOR BX INC UNI/BIL INC/INJECT/IMAGING 02/15/2016 Luanne Bras, MD [facility]  . IR GENERIC HISTORICAL  03/13/2016   IR KYPHO EA ADDL LEVEL THORACIC OR LUMBAR 03/13/2016 Luanne Bras, MD [facility]  . IR GENERIC HISTORICAL  03/13/2016   IR KYPHO EA ADDL LEVEL THORACIC OR LUMBAR 03/13/2016 Luanne Bras, MD [facility]  . IR GENERIC HISTORICAL  03/13/2016   IR BONE TUMOR(S)RF ABLATION 03/13/2016 Luanne Bras, MD [facility]  . IR GENERIC HISTORICAL  03/13/2016   IR KYPHO LUMBAR INC FX REDUCE BONE BX UNI/BIL CANNULATION INC/IMAGING 03/13/2016 Luanne Bras, MD [facility]  . IR GENERIC HISTORICAL  03/13/2016   IR BONE TUMOR(S)RF ABLATION 03/13/2016 Luanne Bras, MD [facility]  . IR GENERIC HISTORICAL  03/13/2016   IR BONE TUMOR(S)RF ABLATION 03/13/2016 Luanne Bras, MD [facility]  . IR GENERIC HISTORICAL  03/31/2016   IR RADIOLOGIST EVAL & MGMT 03/31/2016 [facility]  . LAPAROSCOPIC INGUINAL HERNIA REPAIR Bilateral 12-16-2013  dr gross  . RADIOLOGY WITH ANESTHESIA N/A 02/15/2016   Procedure: Spinal Ablation;  Surgeon: Luanne Bras, MD;  Location: [facility];  Service: Radiology;  Laterality: N/A;  . RADIOLOGY WITH ANESTHESIA N/A 03/13/2016   Procedure: LUMBER ABLATION;  Surgeon: Luanne Bras, MD;  Location: [facility];  Service: Radiology;  Laterality: N/A;  . ROTATOR CUFF REPAIR Right 2003  . TONSILLECTOMY  age 61  . WISDOM TOOTH EXTRACTION      There were no vitals filed for this visit.      Subjective Assessment - 03/10/17 1538    Subjective Pt returning after 3 week absence due to R foot & distal LE cellulitis requiring brief hospitalization. Arrives to therapy in athletic shoe on R for the fist time in almost 2 months.   Pertinent History T6 & T8 kypholoplasty on 11/21/16. Multiple myeloma with multiple vertebral compression fractures with kyphoplasties and vertbroloplasties. Stem cell tranplant on 08/22/16 at [facility]. He follows up regularly with Dr. Marin Olp. He is referred for OP PT for "eval & treat, therapeutic exercises: strengthing/conditioning, therapeutic activities: conditioning"   Patient Stated Goals "gain more strength in legs and back"   Currently in Pain? Yes   Pain Score 1    Pain Location Back   Pain Orientation Mid;Medial   Pain Descriptors / Indicators --   stiffness   Pain Type Chronic pain   Pain Onset More than a month ago   Pain Frequency Constant   Aggravating Factors  more stiff due to inactivity                         [facility] Adult PT Treatment/Exercise - 03/10/17 1535      Self-Care   Self-Care Other Self-Care Comments   Other Self-Care Comments  Provided instruction in set-up & use of home TENS/IT unit, including recommended frequency of use and precautions.     Lumbar Exercises: Aerobic   Stationary Bike lvl 2 x 6'     Lumbar Exercises: Standing   Row Both;15 reps;Theraband;Strengthening   Theraband Level (Row) Level 4 (Blue)   Shoulder Extension Both;15 reps;Theraband;Strengthening   Theraband Level (Shoulder Extension) Level 4 (Blue)     Lumbar Exercises: Quadruped   Opposite Arm/Leg Raise Right arm/Left leg;Left arm/Right leg;10 reps;3 seconds     Modalities   Modalities Electrical Stimulation;Cryotherapy     Cryotherapy   Number Minutes Cryotherapy 15 Minutes   Cryotherapy  Location --  thoracic spine   Type of Cryotherapy Ice pack     Electrical Stimulation   Electrical Stimulation Location thoracic paraspinals   Electrical Stimulation Action IFC   Electrical Stimulation Parameters intensity to pt tol x15'   Electrical Stimulation Goals Pain;Tone     Manual Therapy   Manual Therapy Soft tissue mobilization   Soft tissue mobilization L lower thoracic paraspinals          Trigger Point Dry Needling - 03/10/17 1535    Consent Given? Yes   Education Handout Provided Yes   Muscles Treated Upper Body Longissimus  mid/lower thoracic bilateral   Longissimus Response Twitch response elicited;Palpable increased muscle length                PT Short Term Goals - 01/05/17 1639      PT SHORT TERM GOAL #1   Title independent with initial HEP by 01/09/17   Status Achieved           PT Long Term Goals - 12/29/16 1711      PT LONG TERM GOAL #1   Title Independent with ongoing  HEP +/- gym program to increase back extension & LE strength and decrease pain by 02/20/17   Status On-going     PT LONG TERM GOAL #2   Title B LE strength >/= 4+/5 for improved ease of mobility by 02/20/17   Status On-going     PT LONG TERM GOAL #3   Title Pt will tolerate standing in the kitchen for >/= 45 minutes w/o limitation due to back pain to allow for meal prep by 02/20/17   Status On-going     PT LONG TERM GOAL #4   Title Pt will be able to perform a deep squat with good body mechanics to allow safe retrieval of objects from floor by 02/20/17   Status On-going               Plan - 03/10/17 1542    Clinical Impression Statement [name] returning after 3 week absence due to R foot and distal LE cellultis with brief hospitalization for IV antibiotics. Pt reporting significant relief from initial DN treatment last visit, therefore repeated this today focusing on B lower/mid thoracic paraspinals with positive twitch response and decreased muscle tension noted again today. Home TENS/IT unit fully covered by insurance, therefore unit issued and training provided in proper set-up and use including precautions.    Rehab Potential Good   Clinical Impairments Affecting Rehab Potential T6 & T8 kypholoplasty on 11/21/16. Multiple myeloma with multiple vertebral compression fractures with kyphoplasties and vertbroplasties. Stem cell transplant on 08/22/16 at [facility]. Regular ongoing f/u with oncologist, Dr. Marin Olp. Due to extensive vertebral involvement and compression fractures, will avoid all flexion based activity and lifting >10 pounds.    PT Treatment/Interventions Patient/family education;ADLs/Self Care Home Management;Energy conservation;Neuromuscular re-education;Therapeutic exercise;Therapeutic activities;Functional mobility training;Gait training;Balance training;Moist Heat;Cryotherapy;Manual techniques;Taping;Dry needling   PT Next Visit Plan LE flexibility including manual therapy and  possible dry needling to address muscular tightness; core/lumbar stabilization/strengthening exercises; postural training; LE strengthening - limit weightbearing/resistance/pressure through R foot   Consulted and Agree with Plan of Care Patient      Patient will benefit from skilled therapeutic intervention in order to improve the following deficits and impairments:  Pain, Impaired flexibility, Increased muscle spasms, Decreased range of motion, Decreased strength, Decreased endurance, Decreased activity tolerance, Decreased mobility, Postural dysfunction, Improper body mechanics, Impaired perceived functional ability, Decreased knowledge of precautions  Visit Diagnosis: Abnormal posture  Muscle weakness (generalized)  Pain in thoracic spine  Cramp and spasm     Problem List Patient Active Problem List   Diagnosis Date Noted  . Iron deficiency anemia due to chronic blood loss   . Neutropenia ([place]) 03/02/2017  . Cellulitis 03/01/2017  . Phlebitis or thrombophlebitis of lower extremity 07/21/2016  . Visit for preventive health examination 05/16/2016  . Aphthous ulcer 05/16/2016  . Ureterolithiasis 05/16/2016  . Acute deep vein thrombosis (DVT) of femoral vein of left lower extremity ([place]) 03/21/2016  . Multiple myeloma ([place]) 02/22/2016  . Multiple myeloma not having achieved remission ([place]) 02/22/2016  . Bilateral inguinal hernia (BIH), s/p lap repair 12/16/2013 11/02/2013    Percival Spanish, PT, MPT 03/10/2017, 6:31 PM  [facility] [address]  [place], [place], [address] Phone: [phone]   Fax:  [phone]  Name: Verna Hamon MRN: [id number] Date of Birth: [birth year]

## 2017-03-11 ENCOUNTER — Ambulatory Visit (INDEPENDENT_AMBULATORY_CARE_PROVIDER_SITE_OTHER): Payer: 59 | Admitting: Family Medicine

## 2017-03-11 VITALS — BP 124/78 | Ht 67.0 in | Wt 160.0 lb

## 2017-03-11 DIAGNOSIS — L03115 Cellulitis of right lower limb: Secondary | ICD-10-CM

## 2017-03-11 DIAGNOSIS — M216X2 Other acquired deformities of left foot: Secondary | ICD-10-CM

## 2017-03-11 DIAGNOSIS — M216X1 Other acquired deformities of right foot: Secondary | ICD-10-CM

## 2017-03-11 NOTE — Patient Instructions (Addendum)
Return to office when swelling has resolved and you are ready to have orthotics placed.

## 2017-03-11 NOTE — Progress Notes (Signed)
Chief complaint: Follow-up of right foot pain 6 weeks  History of present illness: Resean is a 61 year old male who presents to the sports medicine office today for follow-up of his right foot pain. He was last seen here in the office 3 weeks ago back on 02/18/17. Since that time, he has had an eventful last 3 weeks. Unfortunately, 5 days after office visit he started to get right great toe pain and erythema. He reports a few days before that he did do a lot of walking in the woods, but did not trip over any rocks, roots, did not have any type of injury. No ulcerations or open wounds. He reports having normal sensation in his lower extremities and feet. He researched his symptoms and thought it seemed to be more consistent with gout, even though he does not have a history of gout. He did follow-up with his oncologist, the oncologist agreed that symptoms seem to be most consistent with gout and he was prescribed colchicine. Unfortunately, he did have interval worsening and spreading of erythema and pain involving his entire foot. He returned to the office 3 days after the initial appointment, they were concerned for infection and he was given 2 rounds of IV antibiotics with daptomycin. He reports that he was sent home also with Augmentin. He reports that even with a 2 rounds of IV antibiotics on 02/26/17 and 02/27/17, as well as Augmentin, symptoms progressively worsened and he reports that he had erythema and pain involving the distal third of his lower leg. Given this concern, he did go to the emergency department at [facility]. He was ultimately admitted with cellulitis of the right lower extremity and was placed on IV antibiotics. CT scan was done of his right foot to ensure no osteomyelitis, there is no evidence of acute osseous abnormality, did show nonspecific edema in the subcutaneous fat along the lateral aspect of the foot. No evidence of fracture seen. He was discharged from the hospital on 03/05/17, was  sent home with outpatient antibiotic of Keflex. He reports physicians at hospital weren't sure how he got the infection. He reports that he did finish last antibiotic yesterday. He reports of remote history of something similar happening approximately 15-20 years ago. He reports that he thought that he had a spider bite, was running in [place] and reported after soaking his leg in the ocean water noting have increased redness and swelling involving the lower half of his leg the next day. He did not report of actually seeing a spider noticing the spider bite.  Interval past medical history, surgical history, family history, and social history reviewed, unchanged unless stated above.  Review of systems:  As stated above  Physical exam: Vital signs are reviewed and are documented in the chart Gen.: Alert, oriented, appears stated age, in no apparent distress HEENT: Moist oral mucosa Respiratory: Normal respirations, able to speak in full sentences Cardiac: Regular rate, distal pulses 2+ Integumentary: No rashes on visible skin:  Neurologic: Strength 5/5, sensation 2+ in bilateral lower extremities Psych: Normal affect, mood is described as good Musculoskeletal: Inspection of right foot does show soft tissue edema and erythema involving the right foot, with more swelling noted along the first MTP, slight warmth, no ecchymosis, no purulence, drainage, or bleeding, no open or healing wounds, no tenderness to palpation over the first MTP, dorsal aspect of right foot, or plantar aspect of right foot, no tenderness today over the second metatarsal head  Assessment and plan: 1. Resolving cellulitis  of right lower extremity 2. History of recent right foot pain, with tenderness over the second metatarsal head, most likely a stress reaction, resolved at this point 3. Bilateral pes cavus and bilateral flattened transverse arch 4. History of multiple myeloma, currently in remission 5. History of left lower  extremity DVT, on lifelong anticoagulation  Resolving cellulitis of right lower extremity -He finished last dose of Keflex yesterday -No evidence of relapse or worsening of infection based on examination today -Discussed with patient to keep close eye on erythema, edema, and pain, have low threshold to be reevaluated in emergency department given his neutropenic status -Given his history of this happening 15-20 years ago, I suspect that he did have MRSA at that time, I suspect that this was a MRSA cellulitis, fortunately CT did not show any evidence of osteomyelitis  Bilateral pes cavus, with bilateral flattened transverse arch -Discussed with patient that given he has continued edema in his right lower extremity, it would not be the best option to proceed with custom orthotics today, discussed it would be best for him to have resolution of his right lower extremity edema before this is done  Will have patient return at that time for custom orthotics or sooner as needed.  Mort Sawyers, M.D. [place]

## 2017-03-12 ENCOUNTER — Ambulatory Visit: Payer: 59 | Admitting: Physical Therapy

## 2017-03-12 DIAGNOSIS — R252 Cramp and spasm: Secondary | ICD-10-CM

## 2017-03-12 DIAGNOSIS — M546 Pain in thoracic spine: Secondary | ICD-10-CM

## 2017-03-12 DIAGNOSIS — R293 Abnormal posture: Secondary | ICD-10-CM

## 2017-03-12 DIAGNOSIS — M6281 Muscle weakness (generalized): Secondary | ICD-10-CM

## 2017-03-12 NOTE — Therapy (Signed)
[facility] [address]  [place], [place], [address] Phone: [phone]   Fax:  [phone]  Physical Therapy Treatment  Patient Details  Name: Jorge [name] MRN: [id number] Date of Birth: [birth year] Referring Provider: Durenda Age, MD  Encounter Date: 03/12/2017      PT End of Session - 03/12/17 1532    Visit Number 14   Number of Visits 16   Authorization Type UHC   PT Start Time 1532   PT Stop Time 1615   PT Time Calculation (min) 43 min   Activity Tolerance Patient tolerated treatment well   Behavior During Therapy [facility] for tasks assessed/performed      Past Medical History:  Diagnosis Date  . Anxiety   . Bone metastasis ([place])   . Chest cold 05/19/2016   productive cough  -- started on antibiotic  . Chronic back pain    due to bone mets from myeloma  . Cough   . Depression   . GERD (gastroesophageal reflux disease)   . Hiatal hernia   . History of chicken pox   . History of concussion    age 61 -- no residual  . History of DVT of lower extremity 03/21/2016  treated and completed w/ xarelto   per doppler left extensive occlusion common femoral, femoral, and popliteal veins and right partial occlusion common femoral and profunda femoral veins/  last doppler 06-04-2016 no evidence acute or chronic dvt noted either leg   . History of radiation therapy 06/09/16-06/23/16   lower thoracic spine 25 Gy in 10 fractions  . Mouth ulcers    secondary to radiation  . Multiple myeloma ([place]) dx 02/22/2016 via bone marrow bx---  oncologist-  [name]   IgG Kappa-- Hyperdiploid/ +11 w/ bone mets--  current treatment chemotherapy (started 08/ 2017)and pallitive radiation to back started 06-09-2016  . Renal calculus, right   . Wears contact lenses     Past Surgical History:  Procedure Laterality Date  . COLONOSCOPY  [id number]  . CYSTOSCOPY W/ URETERAL STENT PLACEMENT Right 06/20/2016   Procedure:  CYSTOSCOPY WITH STENT REPLACEMENT;  Surgeon: Kathie Rhodes, MD;  [facility];  Service: Urology;  Laterality: Right;  . CYSTOSCOPY WITH RETROGRADE PYELOGRAM, URETEROSCOPY AND STENT PLACEMENT Right 05/30/2016   Procedure: CYSTOSCOPY WITH RETROGRADE PYELOGRAM, URETEROSCOPY AND STENT PLACEMENT,DILITATION URETERAL STRICTURE;  Surgeon: Kathie Rhodes, MD;  Location: [facility];  Service: Urology;  Laterality: Right;  . CYSTOSCOPY/RETROGRADE/URETEROSCOPY/STONE EXTRACTION WITH BASKET Right 06/20/2016   Procedure: CYSTOSCOPY/URETEROSCOPY/STONE EXTRACTION WITH BASKET;  Surgeon: Kathie Rhodes, MD;  Location: [facility];  Service: Urology;  Laterality: Right;  . HOLMIUM LASER APPLICATION Right 66/08/9474   Procedure: HOLMIUM LASER APPLICATION;  Surgeon: Kathie Rhodes, MD;  Location: [facility];  Service: Urology;  Laterality: Right;  . IR GENERIC HISTORICAL  02/11/2016   IR RADIOLOGIST EVAL & MGMT 02/11/2016 [facility]  . IR GENERIC HISTORICAL  02/15/2016   IR BONE TUMOR(S)RF ABLATION 02/15/2016 Luanne Bras, MD [facility]  . IR GENERIC HISTORICAL  02/15/2016   IR BONE TUMOR(S)RF ABLATION 02/15/2016 Luanne Bras, MD [facility]  . IR GENERIC HISTORICAL  02/15/2016   IR BONE TUMOR(S)RF ABLATION 02/15/2016 Luanne Bras, MD [facility]  . IR GENERIC HISTORICAL  02/15/2016   IR KYPHO THORACIC WITH BONE BIOPSY 02/15/2016 Luanne Bras, MD [facility]  . IR GENERIC HISTORICAL  02/15/2016   IR KYPHO THORACIC WITH BONE BIOPSY 02/15/2016 Luanne Bras,  MD [facility]  . IR GENERIC HISTORICAL  02/15/2016   IR VERTEBROPLASTY CERV/THOR BX INC UNI/BIL INC/INJECT/IMAGING 02/15/2016 Luanne Bras, MD [facility]  . IR GENERIC HISTORICAL  03/13/2016   IR KYPHO EA ADDL LEVEL THORACIC OR LUMBAR 03/13/2016 Luanne Bras, MD [facility]  . IR GENERIC HISTORICAL  03/13/2016   IR KYPHO EA ADDL LEVEL THORACIC OR LUMBAR 03/13/2016 Luanne Bras, MD [facility]  . IR GENERIC HISTORICAL  03/13/2016   IR BONE TUMOR(S)RF ABLATION 03/13/2016 Luanne Bras, MD [facility]  . IR GENERIC HISTORICAL  03/13/2016   IR KYPHO LUMBAR INC FX REDUCE BONE BX UNI/BIL CANNULATION INC/IMAGING 03/13/2016 Luanne Bras, MD [facility]  . IR GENERIC HISTORICAL  03/13/2016   IR BONE TUMOR(S)RF ABLATION 03/13/2016 Luanne Bras, MD [facility]  . IR GENERIC HISTORICAL  03/13/2016   IR BONE TUMOR(S)RF ABLATION 03/13/2016 Luanne Bras, MD [facility]  . IR GENERIC HISTORICAL  03/31/2016   IR RADIOLOGIST EVAL & MGMT 03/31/2016 [facility]  . LAPAROSCOPIC INGUINAL HERNIA REPAIR Bilateral 12-16-2013  dr gross  . RADIOLOGY WITH ANESTHESIA N/A 02/15/2016   Procedure: Spinal Ablation;  Surgeon: Luanne Bras, MD;  Location: [place];  Service: Radiology;  Laterality: N/A;  . RADIOLOGY WITH ANESTHESIA N/A 03/13/2016   Procedure: LUMBER ABLATION;  Surgeon: Luanne Bras, MD;  Location: [place];  Service: Radiology;  Laterality: N/A;  . ROTATOR CUFF REPAIR Right 2003  . TONSILLECTOMY  age 61  . WISDOM TOOTH EXTRACTION      There were no vitals filed for this visit.      Subjective Assessment - 03/12/17 1530    Subjective Pt reporting soreness from needling yesterday, but much better today.   Pertinent History T6 & T8 kypholoplasty on 11/21/16. Multiple myeloma with multiple vertebral compression fractures with kyphoplasties and vertbroloplasties. Stem cell tranplant on 08/22/16 at [facility]. He follows up regularly with Dr. Marin Olp. He is referred for OP PT for "eval & treat, therapeutic exercises: strengthing/conditioning, therapeutic activities: conditioning"   Patient Stated Goals "gain more strength in legs and back"   Currently in Pain? Yes   Pain Score 2    Pain Location Back   Pain Orientation Mid;Medial   Pain Descriptors / Indicators --  stiffness   Pain Type Chronic pain                         [facility] Adult PT Treatment/Exercise -  03/12/17 1532      Lumbar Exercises: Aerobic   Stationary Bike rec bike - lvl 3 x 6'     Lumbar Exercises: Machines for Strengthening   Other Lumbar Machine Exercise BATCA chest press 15# x20   Other Lumbar Machine Exercise BATCA cable B pallof press 5# x15; BATCA pull down 25# x20     Lumbar Exercises: Standing   Functional Squats 15 reps;3 seconds   Other Standing Lumbar Exercises wall push-ups x15   Other Standing Lumbar Exercises TRX low row x 20, mid row x20     Knee/Hip Exercises: Stretches   Gastroc Stretch Right;30 seconds;2 reps   Gastroc Stretch Limitations standing at wall   Soleus Stretch Right;30 seconds;2 reps   Soleus Stretch Limitations standing at wall   Other Knee/Hip Stretches R Prostretch Achilles 2x30"     Shoulder Exercises: Standing   External Rotation Both;15 reps;Theraband;Strengthening   Theraband Level (Shoulder External Rotation) Level 3 (Green)   Internal Rotation Both;15 reps;Theraband;Strengthening   Theraband Level (Shoulder Internal Rotation)  Level 3 (Green)   Flexion Both;15 reps;Theraband;Strengthening   Theraband Level (Shoulder Flexion) Level 3 (Green)   ABduction Both;15 reps;Theraband;Strengthening   Theraband Level (Shoulder ABduction) Level 3 (Green)                  PT Short Term Goals - 01/05/17 1639      PT SHORT TERM GOAL #1   Title independent with initial HEP by 01/09/17   Status Achieved           PT Long Term Goals - 12/29/16 1711      PT LONG TERM GOAL #1   Title Independent with ongoing HEP +/- gym program to increase back extension & LE strength and decrease pain by 02/20/17   Status On-going     PT LONG TERM GOAL #2   Title B LE strength >/= 4+/5 for improved ease of mobility by 02/20/17   Status On-going     PT LONG TERM GOAL #3   Title Pt will tolerate standing in the kitchen for >/= 45 minutes w/o limitation due to back pain to allow for meal prep by 02/20/17   Status On-going     PT LONG TERM GOAL  #4   Title Pt will be able to perform a deep squat with good body mechanics to allow safe retrieval of objects from floor by 02/20/17   Status On-going               Plan - 03/12/17 1538    Clinical Impression Statement [name] nearing end of current POC and initiated discussion of plan for transition to HEP/gym program upon completion of current episode. Discussed questions/concerns with current HEP with pt deying any issues other than limited tolerance for squats since recent bout of R distal LE/foot cellultis. R ankle motion slightly limited due to mild residual swelling, therefore educated pt on gastroc/soleus streches with pt noting improved ability to perform squats following this. Pt also inquiring about more UE/upper body strengthening options, therefore focused remainder of session on this ensuring to avoid resisted overhead activities which would lead to compressive forces on the spine. Pt inquiring about returning to gym or like activities - informed pt of opportunity to continue to use rehab facility and equipment on an independent basis for monthly fee, with pt expressing interest in this.   Rehab Potential Good   Clinical Impairments Affecting Rehab Potential T6 & T8 kypholoplasty on 11/21/16. Multiple myeloma with multiple vertebral compression fractures with kyphoplasties and vertbroplasties. Stem cell transplant on 08/22/16 at [facility]. Regular ongoing f/u with oncologist, Dr. Marin Olp. Due to extensive vertebral involvement and compression fractures, will avoid all flexion based activity and lifting >10 pounds.    PT Treatment/Interventions Patient/family education;ADLs/Self Care Home Management;Energy conservation;Neuromuscular re-education;Therapeutic exercise;Therapeutic activities;Functional mobility training;Gait training;Balance training;Moist Heat;Cryotherapy;Manual techniques;Taping;Dry needling   PT Next Visit Plan review/update HEP as indicated in prep for transition to HEP/gym  program   Consulted and Agree with Plan of Care Patient      Patient will benefit from skilled therapeutic intervention in order to improve the following deficits and impairments:  Pain, Impaired flexibility, Increased muscle spasms, Decreased range of motion, Decreased strength, Decreased endurance, Decreased activity tolerance, Decreased mobility, Postural dysfunction, Improper body mechanics, Impaired perceived functional ability, Decreased knowledge of precautions  Visit Diagnosis: Abnormal posture  Muscle weakness (generalized)  Pain in thoracic spine  Cramp and spasm     Problem List Patient Active Problem List   Diagnosis Date Noted  . Iron  deficiency anemia due to chronic blood loss   . Neutropenia ([place]) 03/02/2017  . Cellulitis 03/01/2017  . Phlebitis or thrombophlebitis of lower extremity 07/21/2016  . Visit for preventive health examination 05/16/2016  . Aphthous ulcer 05/16/2016  . Ureterolithiasis 05/16/2016  . Acute deep vein thrombosis (DVT) of femoral vein of left lower extremity ([place]) 03/21/2016  . Multiple myeloma ([place]) 02/22/2016  . Multiple myeloma not having achieved remission ([place]) 02/22/2016  . Bilateral inguinal hernia (BIH), s/p lap repair 12/16/2013 11/02/2013    Percival Spanish, PT, MPT 03/12/2017, 6:47 PM  [facility] [address]  [place], [place], [address] Phone: [phone]   Fax:  [phone]  Name: Murle Otting MRN: [id number] Date of Birth: Apr 18, 1956

## 2017-03-13 ENCOUNTER — Other Ambulatory Visit: Payer: 59

## 2017-03-18 ENCOUNTER — Ambulatory Visit: Payer: 59 | Admitting: Physical Therapy

## 2017-03-19 ENCOUNTER — Telehealth: Payer: Self-pay | Admitting: *Deleted

## 2017-03-19 ENCOUNTER — Ambulatory Visit: Payer: 59 | Attending: Pain Medicine | Admitting: Physical Therapy

## 2017-03-19 DIAGNOSIS — R293 Abnormal posture: Secondary | ICD-10-CM | POA: Insufficient documentation

## 2017-03-19 DIAGNOSIS — R252 Cramp and spasm: Secondary | ICD-10-CM | POA: Diagnosis present

## 2017-03-19 DIAGNOSIS — M6281 Muscle weakness (generalized): Secondary | ICD-10-CM | POA: Diagnosis not present

## 2017-03-19 DIAGNOSIS — M546 Pain in thoracic spine: Secondary | ICD-10-CM | POA: Diagnosis not present

## 2017-03-19 NOTE — Therapy (Signed)
[facility] [address]  [place], [place], [address] Phone: [phone]   Fax:  [phone]  Physical Therapy Treatment  Patient Details  Name: Jorge [name] MRN: [id number] Date of Birth: Aug 25, 1955 Referring Provider: Durenda Age, MD  Encounter Date: 03/19/2017      PT End of Session - 03/19/17 0846    Visit Number 15   Number of Visits 16   Authorization Type UHC   PT Start Time 0846   PT Stop Time 0929   PT Time Calculation (min) 43 min   Activity Tolerance Patient tolerated treatment well   Behavior During Therapy [facility] for tasks assessed/performed      Past Medical History:  Diagnosis Date  . Anxiety   . Bone metastasis ([place])   . Chest cold 05/19/2016   productive cough  -- started on antibiotic  . Chronic back pain    due to bone mets from myeloma  . Cough   . Depression   . GERD (gastroesophageal reflux disease)   . Hiatal hernia   . History of chicken pox   . History of concussion    age 33 -- no residual  . History of DVT of lower extremity 03/21/2016  treated and completed w/ xarelto   per doppler left extensive occlusion common femoral, femoral, and popliteal veins and right partial occlusion common femoral and profunda femoral veins/  last doppler 06-04-2016 no evidence acute or chronic dvt noted either leg   . History of radiation therapy 06/09/16-06/23/16   lower thoracic spine 25 Gy in 10 fractions  . Mouth ulcers    secondary to radiation  . Multiple myeloma ([place]) dx 02/22/2016 via bone marrow bx---  oncologist-  [name]   IgG Kappa-- Hyperdiploid/ +11 w/ bone mets--  current treatment chemotherapy (started 08/ 2017)and pallitive radiation to back started 06-09-2016  . Renal calculus, right   . Wears contact lenses     Past Surgical History:  Procedure Laterality Date  . COLONOSCOPY  [id number]  . CYSTOSCOPY W/ URETERAL STENT PLACEMENT Right 06/20/2016   Procedure:  CYSTOSCOPY WITH STENT REPLACEMENT;  Surgeon: Kathie Rhodes, MD;  Location: [facility];  Service: Urology;  Laterality: Right;  . CYSTOSCOPY WITH RETROGRADE PYELOGRAM, URETEROSCOPY AND STENT PLACEMENT Right 05/30/2016   Procedure: CYSTOSCOPY WITH RETROGRADE PYELOGRAM, URETEROSCOPY AND STENT PLACEMENT,DILITATION URETERAL STRICTURE;  Surgeon: Kathie Rhodes, MD;  Location[facility];  Service: Urology;  Laterality: Right;  . CYSTOSCOPY/RETROGRADE/URETEROSCOPY/STONE EXTRACTION WITH BASKET Right 06/20/2016   Procedure: CYSTOSCOPY/URETEROSCOPY/STONE EXTRACTION WITH BASKET;  Surgeon: Kathie Rhodes, MD;  Location: [facility];  Service: Urology;  Laterality: Right;  . HOLMIUM LASER APPLICATION Right 73/10/2874   Procedure: HOLMIUM LASER APPLICATION;  Surgeon: Kathie Rhodes, MD;  Location: [facility];  Service: Urology;  Laterality: Right;  . IR GENERIC HISTORICAL  02/11/2016   IR RADIOLOGIST EVAL & MGMT 02/11/2016 [facility]  . IR GENERIC HISTORICAL  02/15/2016   IR BONE TUMOR(S)RF ABLATION 02/15/2016 Luanne Bras, MD [facility]  . IR GENERIC HISTORICAL  02/15/2016   IR BONE TUMOR(S)RF ABLATION 02/15/2016 Luanne Bras, MD [facility]  . IR GENERIC HISTORICAL  02/15/2016   IR BONE TUMOR(S)RF ABLATION 02/15/2016 Luanne Bras, MD [facility]  . IR GENERIC HISTORICAL  02/15/2016   IR KYPHO THORACIC WITH BONE BIOPSY 02/15/2016 Luanne Bras, MD [facility]  . IR GENERIC HISTORICAL  02/15/2016   IR KYPHO THORACIC WITH BONE BIOPSY 02/15/2016 Luanne Bras,  MD [facility]  . IR GENERIC HISTORICAL  02/15/2016   IR VERTEBROPLASTY CERV/THOR BX INC UNI/BIL INC/INJECT/IMAGING 02/15/2016 Luanne Bras, MD [facility]  . IR GENERIC HISTORICAL  03/13/2016   IR KYPHO EA ADDL LEVEL THORACIC OR LUMBAR 03/13/2016 Luanne Bras, MD [facility]  . IR GENERIC HISTORICAL  03/13/2016   IR KYPHO EA ADDL LEVEL THORACIC OR LUMBAR 03/13/2016 Luanne Bras, MD [facility]  . IR GENERIC HISTORICAL  03/13/2016   IR BONE TUMOR(S)RF ABLATION 03/13/2016 Luanne Bras, MD [facility]  . IR GENERIC HISTORICAL  03/13/2016   IR KYPHO LUMBAR INC FX REDUCE BONE BX UNI/BIL CANNULATION INC/IMAGING 03/13/2016 Luanne Bras, MD [facility]  . IR GENERIC HISTORICAL  03/13/2016   IR BONE TUMOR(S)RF ABLATION 03/13/2016 Luanne Bras, MD [facility]  . IR GENERIC HISTORICAL  03/13/2016   IR BONE TUMOR(S)RF ABLATION 03/13/2016 Luanne Bras, MD [facility]  . IR GENERIC HISTORICAL  03/31/2016   IR RADIOLOGIST EVAL & MGMT 03/31/2016 [facility]  . LAPAROSCOPIC INGUINAL HERNIA REPAIR Bilateral 12-16-2013  dr gross  . RADIOLOGY WITH ANESTHESIA N/A 02/15/2016   Procedure: Spinal Ablation;  Surgeon: Luanne Bras, MD;  Location: [place];  Service: Radiology;  Laterality: N/A;  . RADIOLOGY WITH ANESTHESIA N/A 03/13/2016   Procedure: LUMBER ABLATION;  Surgeon: Luanne Bras, MD;  Location: [place];  Service: Radiology;  Laterality: N/A;  . ROTATOR CUFF REPAIR Right 2003  . TONSILLECTOMY  age 15  . WISDOM TOOTH EXTRACTION      There were no vitals filed for this visit.      Subjective Assessment - 03/19/17 0848    Subjective Pt reporting mid thoracic pain seems to just from R to L.   Pertinent History T6 & T8 kypholoplasty on 11/21/16. Multiple myeloma with multiple vertebral compression fractures with kyphoplasties and vertbroloplasties. Stem cell tranplant on 08/22/16 at [facility]. He follows up regularly with Dr. Marin Olp. He is referred for OP PT for "eval & treat, therapeutic exercises: strengthing/conditioning, therapeutic activities: conditioning"   Patient Stated Goals "gain more strength in legs and back"   Currently in Pain? Yes   Pain Score 2    Pain Location Back   Pain Orientation Mid;Medial   Pain Descriptors / Indicators Tightness  & fatigued   Pain Type Chronic pain   Pain Onset More than a month ago   Pain Frequency Constant   Aggravating Factors   overhead activities   Pain Relieving Factors DN, rest, self-release with small ball on wall   Effect of Pain on Daily Activities worries about pushing to hard            [facility] PT Assessment - 03/19/17 0846      Assessment   Medical Diagnosis Vertebral compression fractures T6 &T8 s/p kyphoplasties   Referring Provider Durenda Age, MD   Onset Date/Surgical Date 11/21/16   Hand Dominance Right   Next MD Visit 03/20/17                     [facility] Adult PT Treatment/Exercise - 03/19/17 0846      Lumbar Exercises: Aerobic   Stationary Bike rec bike - lvl 3 x 6'     Lumbar Exercises: Machines for Strengthening   Other Lumbar Machine Exercise BATCA pull down 25# x20     Lumbar Exercises: Standing   Other Standing Lumbar Exercises wall push-ups on orange Pball x15     Lumbar Exercises: Prone   Straight Leg Raise 15  reps;3 seconds     Lumbar Exercises: Quadruped   Opposite Arm/Leg Raise Right arm/Left leg;Left arm/Right leg;10 reps;3 seconds     Manual Therapy   Manual Therapy Soft tissue mobilization;Taping   Soft tissue mobilization B mid thoracic paraspinals   Kinesiotex Inhibit Muscle     Kinesiotix   Inhibit Muscle  2 parallel "I" strips 30-50% along paraspinals from mid lumbar to upper thoracic spine          Trigger Point Dry Needling - 03/19/17 0846    Consent Given? Yes   Muscles Treated Upper Body Longissimus  mid thoracic   Longissimus Response Twitch response elicited;Palpable increased muscle length                PT Short Term Goals - 01/05/17 1639      PT SHORT TERM GOAL #1   Title independent with initial HEP by 01/09/17   Status Achieved           PT Long Term Goals - 12/29/16 1711      PT LONG TERM GOAL #1   Title Independent with ongoing HEP +/- gym program to increase back extension & LE strength and decrease pain by 02/20/17   Status On-going     PT LONG TERM GOAL #2   Title B LE strength >/= 4+/5 for  improved ease of mobility by 02/20/17   Status On-going     PT LONG TERM GOAL #3   Title Pt will tolerate standing in the kitchen for >/= 45 minutes w/o limitation due to back pain to allow for meal prep by 02/20/17   Status On-going     PT LONG TERM GOAL #4   Title Pt will be able to perform a deep squat with good body mechanics to allow safe retrieval of objects from floor by 02/20/17   Status On-going               Plan - 03/19/17 0851    Clinical Impression Statement Jorge [name] is slowly recovering from recent set-back to R LE/foot cellulitis and has been able to resume majority of prior level exercises. He has noted continued benefit from DN, but feels like it would be helpful to address B paraspinals as it seems that the opposite side seems to tighten up when just addressed unilaterally, therefore treated B mid thoracic paraspinals with twitch response elicited and decreased muscle tension noted following DN and STM. Kinesiotaping applied to B thoracic paraspinals to promote further muscle relaxation. Pt with 1 visit remaining in current POC and mutual plan is to transition to HEP/gym program at end of current episode.    Rehab Potential Good   Clinical Impairments Affecting Rehab Potential T6 & T8 kypholoplasty on 11/21/16. Multiple myeloma with multiple vertebral compression fractures with kyphoplasties and vertbroplasties. Stem cell transplant on 08/22/16 at [facility]. Regular ongoing f/u with oncologist, Dr. Marin Olp. Due to extensive vertebral involvement and compression fractures, will avoid all flexion based activity and lifting >10 pounds.    PT Treatment/Interventions Patient/family education;ADLs/Self Care Home Management;Energy conservation;Neuromuscular re-education;Therapeutic exercise;Therapeutic activities;Functional mobility training;Gait training;Balance training;Moist Heat;Cryotherapy;Manual techniques;Taping;Dry needling   PT Next Visit Plan review/update HEP as indicated in prep for  transition to HEP/gym program   Consulted and Agree with Plan of Care Patient      Patient will benefit from skilled therapeutic intervention in order to improve the following deficits and impairments:  Pain, Impaired flexibility, Increased muscle spasms, Decreased range of motion, Decreased strength, Decreased endurance, Decreased activity  tolerance, Decreased mobility, Postural dysfunction, Improper body mechanics, Impaired perceived functional ability, Decreased knowledge of precautions  Visit Diagnosis: Abnormal posture  Muscle weakness (generalized)  Pain in thoracic spine  Cramp and spasm     Problem List Patient Active Problem List   Diagnosis Date Noted  . Iron deficiency anemia due to chronic blood loss   . Neutropenia ([place]) 03/02/2017  . Cellulitis 03/01/2017  . Phlebitis or thrombophlebitis of lower extremity 07/21/2016  . Visit for preventive health examination 05/16/2016  . Aphthous ulcer 05/16/2016  . Ureterolithiasis 05/16/2016  . Acute deep vein thrombosis (DVT) of femoral vein of left lower extremity ([place]) 03/21/2016  . Multiple myeloma ([place]) 02/22/2016  . Multiple myeloma not having achieved remission ([place]) 02/22/2016  . Bilateral inguinal hernia (BIH), s/p lap repair 12/16/2013 11/02/2013    Percival Spanish, PT, MPT 03/19/2017, 12:33 PM  [facility] [address]  [place], [place], [address] Phone: [phone]   Fax:  [phone]  Name: Jorge [name] MRN: [id number] Date of Birth: 08/20/1955

## 2017-03-19 NOTE — Telephone Encounter (Signed)
Received call from patient wife stating that patient developed a bump last night on his head then woke up to 3 -4 more this am.  Slightly itchy, no pustules noted.  Patient wife concerned that he could have cellulitis again.  Dr. Marin Olp notified.  [name] stated patient needs to apply Mometasone cream to areas.  As always, keep [place] posted as to if they get better.  Wife in agreement.

## 2017-03-20 ENCOUNTER — Other Ambulatory Visit: Payer: 59

## 2017-03-23 ENCOUNTER — Other Ambulatory Visit: Payer: Self-pay | Admitting: *Deleted

## 2017-03-23 DIAGNOSIS — M545 Low back pain, unspecified: Secondary | ICD-10-CM

## 2017-03-23 DIAGNOSIS — I8290 Acute embolism and thrombosis of unspecified vein: Secondary | ICD-10-CM

## 2017-03-23 DIAGNOSIS — G8929 Other chronic pain: Secondary | ICD-10-CM

## 2017-03-23 DIAGNOSIS — C9000 Multiple myeloma not having achieved remission: Secondary | ICD-10-CM

## 2017-03-23 DIAGNOSIS — M7989 Other specified soft tissue disorders: Secondary | ICD-10-CM

## 2017-03-23 DIAGNOSIS — D472 Monoclonal gammopathy: Secondary | ICD-10-CM

## 2017-03-23 MED ORDER — LENALIDOMIDE 10 MG PO CAPS: ORAL_CAPSULE | ORAL | 0 refills | Status: DC

## 2017-03-24 ENCOUNTER — Ambulatory Visit: Payer: 59 | Admitting: Physical Therapy

## 2017-03-24 ENCOUNTER — Other Ambulatory Visit: Payer: Self-pay | Admitting: Hematology & Oncology

## 2017-03-24 DIAGNOSIS — M6281 Muscle weakness (generalized): Secondary | ICD-10-CM

## 2017-03-24 DIAGNOSIS — M545 Low back pain, unspecified: Secondary | ICD-10-CM

## 2017-03-24 DIAGNOSIS — I8290 Acute embolism and thrombosis of unspecified vein: Secondary | ICD-10-CM

## 2017-03-24 DIAGNOSIS — D472 Monoclonal gammopathy: Secondary | ICD-10-CM

## 2017-03-24 DIAGNOSIS — M7989 Other specified soft tissue disorders: Secondary | ICD-10-CM

## 2017-03-24 DIAGNOSIS — R252 Cramp and spasm: Secondary | ICD-10-CM

## 2017-03-24 DIAGNOSIS — C9000 Multiple myeloma not having achieved remission: Secondary | ICD-10-CM

## 2017-03-24 DIAGNOSIS — G8929 Other chronic pain: Secondary | ICD-10-CM

## 2017-03-24 DIAGNOSIS — R293 Abnormal posture: Secondary | ICD-10-CM

## 2017-03-24 DIAGNOSIS — M546 Pain in thoracic spine: Secondary | ICD-10-CM

## 2017-03-24 NOTE — Therapy (Addendum)
[facility] [address]  [place], [place], [address] Phone: [phone]   Fax:  [phone]  Physical Therapy Treatment  Patient Details  Name: Jorge [name] MRN: [id number] Date of Birth: May 23, 1956 Referring Provider: Durenda Age, MD  Encounter Date: 03/24/2017      PT End of Session - 03/24/17 1616    Visit Number 16   Number of Visits 16   Authorization Type [employer]   PT Start Time 4975   PT Stop Time 1658   PT Time Calculation (min) 42 min   Activity Tolerance Patient tolerated treatment well   Behavior During Therapy [facility] for tasks assessed/performed      Past Medical History:  Diagnosis Date  . Anxiety   . Bone metastasis ([place])   . Chest cold 05/19/2016   productive cough  -- started on antibiotic  . Chronic back pain    due to bone mets from myeloma  . Cough   . Depression   . GERD (gastroesophageal reflux disease)   . Hiatal hernia   . History of chicken pox   . History of concussion    age 61 -- no residual  . History of DVT of lower extremity 03/21/2016  treated and completed w/ xarelto   per doppler left extensive occlusion common femoral, femoral, and popliteal veins and right partial occlusion common femoral and profunda femoral veins/  last doppler 06-04-2016 no evidence acute or chronic dvt noted either leg   . History of radiation therapy 06/09/16-06/23/16   lower thoracic spine 25 Gy in 10 fractions  . Mouth ulcers    secondary to radiation  . Multiple myeloma ([place]) dx 02/22/2016 via bone marrow bx---  oncologist-  [name]   IgG Kappa-- Hyperdiploid/ +11 w/ bone mets--  current treatment chemotherapy (started 08/ 2017)and pallitive radiation to back started 06-09-2016  . Renal calculus, right   . Wears contact lenses     Past Surgical History:  Procedure Laterality Date  . COLONOSCOPY  [id number]  . CYSTOSCOPY W/ URETERAL STENT PLACEMENT Right 06/20/2016   Procedure:  CYSTOSCOPY WITH STENT REPLACEMENT;  Surgeon: Kathie Rhodes, MD;  Location: [facility];  Service: Urology;  Laterality: Right;  . CYSTOSCOPY WITH RETROGRADE PYELOGRAM, URETEROSCOPY AND STENT PLACEMENT Right 05/30/2016   Procedure: CYSTOSCOPY WITH RETROGRADE PYELOGRAM, URETEROSCOPY AND STENT PLACEMENT,DILITATION URETERAL STRICTURE;  Surgeon: Kathie Rhodes, MD;  Location: [facility];  Service: Urology;  Laterality: Right;  . CYSTOSCOPY/RETROGRADE/URETEROSCOPY/STONE EXTRACTION WITH BASKET Right 06/20/2016   Procedure: CYSTOSCOPY/URETEROSCOPY/STONE EXTRACTION WITH BASKET;  Surgeon: Kathie Rhodes, MD;  [facility];  Service: Urology;  Laterality: Right;  . HOLMIUM LASER APPLICATION Right 30/0/5110   Procedure: HOLMIUM LASER APPLICATION;  Surgeon: Kathie Rhodes, MD;  Location: [facility];  Service: Urology;  Laterality: Right;  . IR GENERIC HISTORICAL  02/11/2016   IR RADIOLOGIST EVAL & MGMT 02/11/2016 [facility]  . IR GENERIC HISTORICAL  02/15/2016   IR BONE TUMOR(S)RF ABLATION 02/15/2016 Luanne Bras, MD [facility]  . IR GENERIC HISTORICAL  02/15/2016   IR BONE TUMOR(S)RF ABLATION 02/15/2016 Luanne Bras, MD [facility]  . IR GENERIC HISTORICAL  02/15/2016   IR BONE TUMOR(S)RF ABLATION 02/15/2016 Luanne Bras, MD [facility]  . IR GENERIC HISTORICAL  02/15/2016   IR KYPHO THORACIC WITH BONE BIOPSY 02/15/2016 Luanne Bras, MD [facility]  . IR GENERIC HISTORICAL  02/15/2016   IR KYPHO THORACIC WITH BONE BIOPSY 02/15/2016 Luanne Bras,  MD [facility]  . IR GENERIC HISTORICAL  02/15/2016   IR VERTEBROPLASTY CERV/THOR BX INC UNI/BIL INC/INJECT/IMAGING 02/15/2016 Luanne Bras, MD [facility]  . IR GENERIC HISTORICAL  03/13/2016   IR KYPHO EA ADDL LEVEL THORACIC OR LUMBAR 03/13/2016 Luanne Bras, MD [facility]  . IR GENERIC HISTORICAL  03/13/2016   IR KYPHO EA ADDL LEVEL THORACIC OR LUMBAR 03/13/2016 Luanne Bras, MD [facility]  . IR GENERIC HISTORICAL  03/13/2016   IR BONE TUMOR(S)RF ABLATION 03/13/2016 Luanne Bras, MD [facility]  . IR GENERIC HISTORICAL  03/13/2016   IR KYPHO LUMBAR INC FX REDUCE BONE BX UNI/BIL CANNULATION INC/IMAGING 03/13/2016 Luanne Bras, MD [facility]  . IR GENERIC HISTORICAL  03/13/2016   IR BONE TUMOR(S)RF ABLATION 03/13/2016 Luanne Bras, MD [facility]  . IR GENERIC HISTORICAL  03/13/2016   IR BONE TUMOR(S)RF ABLATION 03/13/2016 Luanne Bras, MD [facility]  . IR GENERIC HISTORICAL  03/31/2016   IR RADIOLOGIST EVAL & MGMT 03/31/2016 [facility]  . LAPAROSCOPIC INGUINAL HERNIA REPAIR Bilateral 12-16-2013  dr gross  . RADIOLOGY WITH ANESTHESIA N/A 02/15/2016   Procedure: Spinal Ablation;  Surgeon: Luanne Bras, MD;  Location: [place];  Service: Radiology;  Laterality: N/A;  . RADIOLOGY WITH ANESTHESIA N/A 03/13/2016   Procedure: LUMBER ABLATION;  Surgeon: Luanne Bras, MD;  Location: [place];  Service: Radiology;  Laterality: N/A;  . ROTATOR CUFF REPAIR Right 2003  . TONSILLECTOMY  age 61  . WISDOM TOOTH EXTRACTION      There were no vitals filed for this visit.      Subjective Assessment - 03/24/17 1624    Subjective Pt reporting pain intensity has been less overall and had first pain free period in over 15 months for a while on Sat evening.   Pertinent History T6 & T8 kypholoplasty on 11/21/16. Multiple myeloma with multiple vertebral compression fractures with kyphoplasties and vertbroloplasties. Stem cell tranplant on 08/22/16 at [facility]. He follows up regularly with Dr. Marin Olp. He is referred for OP PT for "eval & treat, therapeutic exercises: strengthing/conditioning, therapeutic activities: conditioning"   How long can you stand comfortably? 45 minutes   Patient Stated Goals "gain more strength in legs and back"   Currently in Pain? Yes   Pain Score 2    Pain Location Back   Pain Orientation Mid;Medial   Pain Descriptors / Indicators Tightness  &  "weakness"   Pain Type Chronic pain   Pain Onset More than a month ago   Pain Frequency Intermittent            [facility] PT Assessment - 03/24/17 1616      Assessment   Medical Diagnosis Vertebral compression fractures T6 &T8 s/p kyphoplasties   Referring Provider Durenda Age, MD   Onset Date/Surgical Date 11/21/16   Hand Dominance Right     Observation/Other Assessments   Focus on Therapeutic Outcomes (FOTO)  44% (56% limitation)     Strength   Right Hip Flexion 5/5   Right Hip Extension 4+/5  for available ROM   Right Hip External Rotation  4+/5   Right Hip Internal Rotation 5/5   Right Hip ABduction 4+/5   Right Hip ADduction 4+/5   Left Hip Flexion 5/5   Left Hip Extension 4+/5  for available ROM   Left Hip External Rotation 4+/5   Left Hip Internal Rotation 5/5   Left Hip ABduction 4/5   Left Hip ADduction 4/5   Right Knee Flexion 5/5  Right Knee Extension 5/5   Left Knee Flexion 5/5   Left Knee Extension 5/5                     [facility] Adult PT Treatment/Exercise - 03/24/17 1616      Lumbar Exercises: Stretches   Hip Flexor Stretch 30 seconds;1 rep  each, bilateral   Hip Flexor Stretch Limitations lunge stretch with strap; prone press-up to elbows with opp leg over edge of mat table   Prone on Elbows Stretch 30 seconds;2 reps   Press Ups 30 seconds;1 rep     Lumbar Exercises: Aerobic   Stationary Bike rec bike - lvl 3 x 6'                PT Education - 03/24/17 1658    Education provided Yes   Education Details HEP review + alternative versions of hip flexor stretches   Person(s) Educated Patient   Methods Explanation;Demonstration;Handout   Comprehension Verbalized understanding;Returned demonstration          PT Short Term Goals - 01/05/17 1639      PT SHORT TERM GOAL #1   Title independent with initial HEP by 01/09/17   Status Achieved           PT Long Term Goals - 03/24/17 1627      PT LONG TERM  GOAL #1   Title Independent with ongoing HEP +/- gym program to increase back extension & LE strength and decrease pain by 02/20/17   Status Achieved     PT LONG TERM GOAL #2   Title B LE strength >/= 4+/5 for improved ease of mobility by 02/20/17   Status Partially Met  met except L hip ABD & ADD     PT LONG TERM GOAL #3   Title Pt will tolerate standing in the kitchen for >/= 45 minutes w/o limitation due to back pain to allow for meal prep by 02/20/17   Status Achieved     PT LONG TERM GOAL #4   Title Pt will be able to perform a deep squat with good body mechanics to allow safe retrieval of objects from floor by 02/20/17   Status Partially Met  able to perform deep squat but needs UE support               Plan - 03/24/17 1658    Clinical Impression Statement [name] has demonstrated excellent progress with PT with overall reduction in thoracic back pain, reporting decreased resting intensity and less frequent and intense flare-ups as well as first pain free episode in >15 months over the weekend. B LE strength now grossly 4+/5 or greater with only slight weakness persisting in L hip abduction/adduction. Pt reports good comfort with advanced HEP and denies need for further review other than requesting ways to progress stretching for hip flexors and lumbar extension, therefore provided instruction in to alterantive hip flexor strectches with pt noting good strech with both. Pt ready to transition to HEP + gym program at present and plans to continue to use rehab gym equipment on his own. Will place pt on 30 day hold in the event that issue arise with transition or need arises for further DN or manual therapy. If pt were to return, will recert as indicated.   Rehab Potential Good   Clinical Impairments Affecting Rehab Potential T6 & T8 kypholoplasty on 11/21/16. Multiple myeloma with multiple vertebral compression fractures with kyphoplasties and vertbroplasties. Stem cell transplant  on 08/22/16  at [facility]. Regular ongoing f/u with oncologist, Dr. Marin Olp. Due to extensive vertebral involvement and compression fractures, will avoid all flexion based activity and lifting >10 pounds.    PT Treatment/Interventions Patient/family education;ADLs/Self Care Home Management;Energy conservation;Neuromuscular re-education;Therapeutic exercise;Therapeutic activities;Functional mobility training;Gait training;Balance training;Moist Heat;Cryotherapy;Manual techniques;Taping;Dry needling   PT Next Visit Plan 30 day hold   Consulted and Agree with Plan of Care Patient      Patient will benefit from skilled therapeutic intervention in order to improve the following deficits and impairments:  Pain, Impaired flexibility, Increased muscle spasms, Decreased range of motion, Decreased strength, Decreased endurance, Decreased activity tolerance, Decreased mobility, Postural dysfunction, Improper body mechanics, Impaired perceived functional ability, Decreased knowledge of precautions  Visit Diagnosis: Abnormal posture  Muscle weakness (generalized)  Pain in thoracic spine  Cramp and spasm     Problem List Patient Active Problem List   Diagnosis Date Noted  . Iron deficiency anemia due to chronic blood loss   . Neutropenia ([place]) 03/02/2017  . Cellulitis 03/01/2017  . Phlebitis or thrombophlebitis of lower extremity 07/21/2016  . Visit for preventive health examination 05/16/2016  . Aphthous ulcer 05/16/2016  . Ureterolithiasis 05/16/2016  . Acute deep vein thrombosis (DVT) of femoral vein of left lower extremity ([place]) 03/21/2016  . Multiple myeloma ([place]) 02/22/2016  . Multiple myeloma not having achieved remission ([place]) 02/22/2016  . Bilateral inguinal hernia (BIH), s/p lap repair 12/16/2013 11/02/2013    Percival Spanish, PT, MPT 03/24/2017, 5:26 PM  [facility] [address]  [place], [place], [address] Phone: [phone]   Fax:   [phone]  Name: Tandy Lewin MRN: [id number] Date of Birth: [birth year]   PHYSICAL THERAPY DISCHARGE SUMMARY  Visits from Start of Care: 16  Current functional level related to goals / functional outcomes:   Refer to above clinical impression. Pt has not needed to return in 30 days, therefore will proceed with discharge from PT for this episode.   Remaining deficits:   As above.   Education / Equipment:   HEP/gym program   Plan: Patient agrees to discharge.  Patient goals were partially met. Patient is being discharged due to being pleased with the current functional level.  ?????    Percival Spanish, PT, MPT 04/23/17, 1:45 PM  [facility] [address]  [place], [place], [address] Phone: [phone]   Fax:  [phone]

## 2017-03-25 ENCOUNTER — Other Ambulatory Visit: Payer: Self-pay | Admitting: Family

## 2017-03-26 ENCOUNTER — Encounter (HOSPITAL_COMMUNITY): Payer: 59

## 2017-03-27 ENCOUNTER — Other Ambulatory Visit: Payer: 59

## 2017-04-03 ENCOUNTER — Other Ambulatory Visit: Payer: 59

## 2017-04-07 ENCOUNTER — Ambulatory Visit (HOSPITAL_BASED_OUTPATIENT_CLINIC_OR_DEPARTMENT_OTHER): Payer: 59 | Admitting: Hematology & Oncology

## 2017-04-07 ENCOUNTER — Ambulatory Visit (HOSPITAL_BASED_OUTPATIENT_CLINIC_OR_DEPARTMENT_OTHER): Payer: 59

## 2017-04-07 ENCOUNTER — Other Ambulatory Visit: Payer: Self-pay

## 2017-04-07 ENCOUNTER — Other Ambulatory Visit (HOSPITAL_BASED_OUTPATIENT_CLINIC_OR_DEPARTMENT_OTHER): Payer: 59

## 2017-04-07 VITALS — BP 107/65 | HR 65 | Temp 98.3°F | Resp 18 | Wt 164.0 lb

## 2017-04-07 DIAGNOSIS — M545 Low back pain, unspecified: Secondary | ICD-10-CM

## 2017-04-07 DIAGNOSIS — C9000 Multiple myeloma not having achieved remission: Secondary | ICD-10-CM

## 2017-04-07 DIAGNOSIS — D509 Iron deficiency anemia, unspecified: Secondary | ICD-10-CM

## 2017-04-07 DIAGNOSIS — G8929 Other chronic pain: Secondary | ICD-10-CM

## 2017-04-07 DIAGNOSIS — D472 Monoclonal gammopathy: Secondary | ICD-10-CM

## 2017-04-07 DIAGNOSIS — R21 Rash and other nonspecific skin eruption: Secondary | ICD-10-CM | POA: Diagnosis not present

## 2017-04-07 DIAGNOSIS — D500 Iron deficiency anemia secondary to blood loss (chronic): Secondary | ICD-10-CM

## 2017-04-07 DIAGNOSIS — M7989 Other specified soft tissue disorders: Secondary | ICD-10-CM

## 2017-04-07 DIAGNOSIS — Z23 Encounter for immunization: Secondary | ICD-10-CM

## 2017-04-07 DIAGNOSIS — I8290 Acute embolism and thrombosis of unspecified vein: Secondary | ICD-10-CM

## 2017-04-07 LAB — CBC WITH DIFFERENTIAL (CANCER CENTER ONLY)
BASO#: 0 10*3/uL (ref 0.0–0.2)
BASO%: 0.4 % (ref 0.0–2.0)
EOS ABS: 0.2 10*3/uL (ref 0.0–0.5)
EOS%: 8.1 % — AB (ref 0.0–7.0)
HCT: 35.9 % — ABNORMAL LOW (ref 38.7–49.9)
HEMOGLOBIN: 11.5 g/dL — AB (ref 13.0–17.1)
LYMPH#: 0.8 10*3/uL — ABNORMAL LOW (ref 0.9–3.3)
LYMPH%: 29.5 % (ref 14.0–48.0)
MCH: 28.6 pg (ref 28.0–33.4)
MCHC: 32 g/dL (ref 32.0–35.9)
MCV: 89 fL (ref 82–98)
MONO#: 0.4 10*3/uL (ref 0.1–0.9)
MONO%: 14.7 % — AB (ref 0.0–13.0)
NEUT%: 47.3 % (ref 40.0–80.0)
NEUTROS ABS: 1.2 10*3/uL — AB (ref 1.5–6.5)
PLATELETS: 150 10*3/uL (ref 145–400)
RBC: 4.02 10*6/uL — ABNORMAL LOW (ref 4.20–5.70)
RDW: 21.1 % — AB (ref 11.1–15.7)
WBC: 2.6 10*3/uL — ABNORMAL LOW (ref 4.0–10.0)

## 2017-04-07 LAB — CMP (CANCER CENTER ONLY)
ALBUMIN: 3.3 g/dL (ref 3.3–5.5)
ALT(SGPT): 32 U/L (ref 10–47)
AST: 33 U/L (ref 11–38)
Alkaline Phosphatase: 65 U/L (ref 26–84)
BILIRUBIN TOTAL: 0.6 mg/dL (ref 0.20–1.60)
BUN: 9 mg/dL (ref 7–22)
CHLORIDE: 102 meq/L (ref 98–108)
CO2: 29 mEq/L (ref 18–33)
CREATININE: 1.1 mg/dL (ref 0.6–1.2)
Calcium: 8.8 mg/dL (ref 8.0–10.3)
GLUCOSE: 73 mg/dL (ref 73–118)
Potassium: 3.7 mEq/L (ref 3.3–4.7)
Sodium: 141 mEq/L (ref 128–145)
Total Protein: 6.1 g/dL — ABNORMAL LOW (ref 6.4–8.1)

## 2017-04-07 LAB — IRON AND TIBC
%SAT: 10 % — AB (ref 20–55)
IRON: 40 ug/dL — AB (ref 42–163)
TIBC: 386 ug/dL (ref 202–409)
UIBC: 346 ug/dL (ref 117–376)

## 2017-04-07 LAB — FERRITIN: Ferritin: 34 ng/ml (ref 22–316)

## 2017-04-07 MED ORDER — FENTANYL 25 MCG/HR TD PT72: 25.0000 ug | MEDICATED_PATCH | TRANSDERMAL | 0 refills | Status: DC

## 2017-04-07 MED ORDER — ZOLEDRONIC ACID 4 MG/100ML IV SOLN
4.0000 mg | Freq: Once | INTRAVENOUS | Status: AC
  Administered 2017-04-07: 4 mg via INTRAVENOUS
  Filled 2017-04-07: qty 100

## 2017-04-07 MED ORDER — INFLUENZA VAC SPLIT QUAD 0.5 ML IM SUSY
PREFILLED_SYRINGE | INTRAMUSCULAR | Status: AC
  Filled 2017-04-07: qty 0.5

## 2017-04-07 MED ORDER — INFLUENZA VAC SPLIT QUAD 0.5 ML IM SUSY
0.5000 mL | PREFILLED_SYRINGE | Freq: Once | INTRAMUSCULAR | Status: AC
  Administered 2017-04-07: 0.5 mL via INTRAMUSCULAR

## 2017-04-07 MED ORDER — ZOSTER VAC RECOMB ADJUVANTED 50 MCG/0.5ML IM SUSR
0.5000 mL | Freq: Once | INTRAMUSCULAR | Status: AC
  Administered 2017-04-07: 0.5 mL via INTRAMUSCULAR
  Filled 2017-04-07: qty 0.5

## 2017-04-07 NOTE — Progress Notes (Signed)
Hematology and Oncology Follow Up Visit  Jorge [name] [id number] 09-09-1955 61 y.o. 04/07/2017   Principle Diagnosis:  IgG Kappa myeloma - Hyperdiploid/+11 DVT of the LEFT and RIGHT leg   Current Therapy:   Revlimid 10 mg po q day - on hold due to cellulitis in foot, will old one more week and restart Zometa 4 mg IV q 6 weeks- next dose is 05/2017 Xarelto 10 mg PO daily   Interim History:  Jorge [name] is here today for follow-up. He is doing pretty well. There is no problems with his legs. He had an episode of cellulitis back in August. This was quite significant. Thankfully, he got through this.  He is on Revlimid. His will well with Revlimid. This is his week off.  His last M spike back in August was 0.1 g/dL. His IgG level was 516 mg/dL. His Kappa light chain was 1.6 mg/dL.  He's having some back problems. He presented with bad compression fractures. He's had kyphoplasty. He's had radiation therapy. He is using a fentanyl patch.  He's had no cough. Is been no shortness of breath. He's had no bleeding. He's had no change in bowel or bladder habits.  He is walking. He is trying to do exercising.   ECOG Performance Status: 1 - Symptomatic but completely ambulatory  Medications:  Allergies as of 04/07/2017   No Known Allergies     Medication List       Accurate as of 04/07/17 12:22 PM. Always use your most recent med list.          cephALEXin 250 MG capsule Commonly known as:  KEFLEX Take 1 capsule (250 mg total) by mouth 4 (four) times daily. For 5days   famciclovir 500 MG tablet Commonly known as:  FAMVIR Take 1 tablet (500 mg total) by mouth daily.   fentaNYL 25 MCG/HR patch Commonly known as:  DURAGESIC - dosed mcg/hr Place 1 patch (25 mcg total) onto the skin every other day.   lidocaine 5 % Commonly known as:  LIDODERM PLAEC 1 PATCH ONTO THE SKIN DAILY.APPLY 1 PATCH TO THE MOST PAINFUL AREA FOR 12 HR IN A 24 HR PERIOD   magnesium oxide 400 MG  tablet Commonly known as:  MAG-OX Take 400 mg by mouth daily.   MITIGARE 0.6 MG Caps Generic drug:  Colchicine TAKE 1 CAPSULE (0.6 MG TOTAL) BY MOUTH DAILY. FIRST DAY TAKW 1 CAPSULE EVERY 8 HOURS.   ondansetron 8 MG disintegrating tablet Commonly known as:  ZOFRAN-ODT Take 1 tablet (8 mg total) by mouth every 8 (eight) hours as needed.   Oxycodone HCl 10 MG Tabs Take 1 tablet (10 mg total) by mouth every 4 (four) hours as needed.   polyethylene glycol powder powder Commonly known as:  MIRALAX Take 17 g by mouth daily.   PROBIOTIC PO Take 1 capsule by mouth daily.   REVLIMID 10 MG capsule Generic drug:  lenalidomide TAKE 1 CAPSULE BY MOUTH EVERY DAY FOR 21 DAYS, THEN 7 DAYS OFF   rivaroxaban 10 MG Tabs tablet Commonly known as:  XARELTO Take 1 tablet (10 mg total) by mouth daily with supper.   rOPINIRole 0.25 MG tablet Commonly known as:  REQUIP Take one tablet 1-3hours before bed for two days and then increase to two tablets 1-3hours before bed daily.   senna 8.6 MG Tabs tablet Commonly known as:  SENOKOT Take 2 tablets (17.2 mg total) by mouth daily.   sertraline 100 MG tablet Commonly known as:  ZOLOFT Take 100 mg by mouth at bedtime.   Vitamin D-3 5000 units Tabs Take 5,000 mg by mouth daily.   zolpidem 10 MG tablet Commonly known as:  AMBIEN Take 1 tablet (10 mg total) by mouth at bedtime as needed.            Discharge Care Instructions        Start     Ordered   04/07/17 0000  CBC with Differential ([facility])     04/07/17 1023   04/07/17 0000  CMP STAT ([place] only)     04/07/17 1023   04/07/17 0000  IgG, IgA, IgM     04/07/17 1023   04/07/17 0000  Kappa/lambda light chains     04/07/17 1023   04/07/17 0000  Lactate dehydrogenase     04/07/17 1023   04/07/17 0000  Serum protein electrophoresis with reflex     04/07/17 1023      Allergies: No Known Allergies  Past Medical History, Surgical history, Social  history, and Family History were reviewed and updated.  Review of Systems: All other 10 point review of systems is negative.   Physical Exam:  weight is 164 lb (74.4 kg). His oral temperature is 98.3 F (36.8 C). His blood pressure is 107/65 and his pulse is 65. His respiration is 18 and oxygen saturation is 99%.   Wt Readings from Last 3 Encounters:  04/07/17 164 lb (74.4 kg)  03/11/17 160 lb (72.6 kg)  03/10/17 157 lb (71.2 kg)    Physical Exam  Constitutional: He is oriented to person, place, and time.  HENT:  Head: Normocephalic and atraumatic.  Mouth/Throat: Oropharynx is clear and moist.  Eyes: Pupils are equal, round, and reactive to light. EOM are normal.  Neck: Normal range of motion.  Cardiovascular: Normal rate, regular rhythm and normal heart sounds.   Pulmonary/Chest: Effort normal and breath sounds normal.  Abdominal: Soft. Bowel sounds are normal.  Musculoskeletal: Normal range of motion. He exhibits no edema, tenderness or deformity.  Lymphadenopathy:    He has no cervical adenopathy.  Neurological: He is alert and oriented to person, place, and time.  Skin: Skin is warm and dry. No rash noted. No erythema.  Psychiatric: He has a normal mood and affect. His behavior is normal. Judgment and thought content normal.  Vitals reviewed.    Lab Results  Component Value Date   WBC 2.6 (L) 04/07/2017   HGB 11.5 (L) 04/07/2017   HCT 35.9 (L) 04/07/2017   MCV 89 04/07/2017   PLT 150 04/07/2017   Lab Results  Component Value Date   FERRITIN 34 04/07/2017   IRON 40 (L) 04/07/2017   TIBC 386 04/07/2017   UIBC 346 04/07/2017   IRONPCTSAT 10 (L) 04/07/2017   Lab Results  Component Value Date   RETICCTPCT 1.1 03/05/2017   RBC 4.02 (L) 04/07/2017   Lab Results  Component Value Date   KAPLAMBRATIO 1.90 (H) 01/05/2017   Lab Results  Component Value Date   IGGSERUM 516 (L) 03/02/2017   IGA 106 03/02/2017   IGMSERUM 30 03/02/2017   Lab Results  Component  Value Date   TOTALPROTELP 5.6 (L) 03/02/2017   MSPIKE 0.5 (H) 07/28/2016     Chemistry      Component Value Date/Time   NA 141 04/07/2017 0853   NA 140 10/03/2016 0845   K 3.7 04/07/2017 0853   K 4.3 10/03/2016 0845   CL 102 04/07/2017 0853  CO2 29 04/07/2017 0853   CO2 26 10/03/2016 0845   BUN 9 04/07/2017 0853   BUN 11.9 10/03/2016 0845   CREATININE 1.1 04/07/2017 0853   CREATININE 0.9 10/03/2016 0845      Component Value Date/Time   CALCIUM 8.8 04/07/2017 0853   CALCIUM 9.9 10/03/2016 0845   ALKPHOS 65 04/07/2017 0853   ALKPHOS 80 10/03/2016 0845   AST 33 04/07/2017 0853   AST 28 10/03/2016 0845   ALT 32 04/07/2017 0853   ALT 26 10/03/2016 0845   BILITOT 0.60 04/07/2017 0853   BILITOT 0.48 10/03/2016 0845      Impression and Plan: Mr. Branden is a pleasant 61 yo caucasian gentleman with IgG kappa myeloma. He underwent chemotherapy with RVD, followed by autologous stem cell transplant in February 2018.   He is doing well. I'm still not sure how he got the cellulitis.  He does have a little bit of a rash with the Revlimid. I think he does take some cream for this.  We'll go ahead with his Zometa today. I think we have to do his Zometa every 6 weeks. I think this would be reasonable. Hopefully we will be able to move the Zometa treatments have as we see him in the future.   I will like to see him back in 6 weeks.    [name], MD 9/25/201812:22 PM is

## 2017-04-07 NOTE — Patient Instructions (Signed)

## 2017-04-08 ENCOUNTER — Ambulatory Visit (INDEPENDENT_AMBULATORY_CARE_PROVIDER_SITE_OTHER): Payer: 59 | Admitting: Family Medicine

## 2017-04-08 VITALS — BP 118/76 | Ht 67.0 in | Wt 160.0 lb

## 2017-04-08 DIAGNOSIS — R269 Unspecified abnormalities of gait and mobility: Secondary | ICD-10-CM

## 2017-04-08 NOTE — Patient Instructions (Signed)
Please let us know if you need anything changed with your orthotics. Feel free to stop by office at any time with concerns.

## 2017-04-08 NOTE — Progress Notes (Signed)
Chief complaint: Needs orthotics for bilateral pes cavus and bilateral flattened transverse arch  History of present illness: Jorge [name] is a 61 year old male who presents to the sports medicine office today with request to have orthotics made today. He does have history of bilateral pes cavus and bilateral flattened transverse arch. Unfortunately, have not been able to make orthotics recently due to his history of having cellulitis of right lower shin repair last office visit here was back on 03/11/17, unfortunately at that time he did have continued swelling over the first MTP, discussed that given the edema it would not be the best option pursued was custom orthotics today. That has essentially resolved. He is not report of any fevers, chills, or night sweats. He does not report of any foot pain today. No numbness, tingling, or weakness.  Interval past medical history, surgical history, family history, and social history reviewed, unchanged.  Review of systems:  As stated above  Physical exam: Vital signs are reviewed and are documented in the chart Gen.: Alert, oriented, appears stated age, in no apparent distress HEENT: Moist oral mucosa Respiratory: Normal respirations, able to speak in full sentences Cardiac: Regular rate, distal pulses 2+ Integumentary: No rashes on visible skin:  Neurologic: No focal deficits Psych: Normal affect, mood is described as good Musculoskeletal: Inspection of bilateral feet does show that he does have bilateral pes cavus, with flattened transverse arch, does have slight pronation with gait ambulation  Patient was fitted for a : standard, cushioned, semi-rigid orthotic. The orthotic was heated and afterward the patient stood on the orthotic blank positioned on the orthotic stand. The patient was positioned in subtalar neutral position and 10 degrees of ankle dorsiflexion in a weight bearing stance. After completion of molding, a stable base was applied to the  orthotic blank. The blank was ground to a stable position for weight bearing. Size: 10 Base: Blue EVA Posting: None Additional orthotic padding: Left MT pad  New custom orthotics were created today. Patient found them to be very comfortable prior to leaving the office. Total of 30 minutes was spent with the patient with greater than 50% of the time spent in face-to-face consultation discussing orthotic construction, instruction, and fitting. Gait was neutralized with orthotics in place. Patient will follow-up as needed.  Assessment and plan: 1. Abnormality of gait 2. Bilateral pes cavus with flattened transverse arch 3. History of multiple myeloma, currently in remission  Abnormality of gait -Custom orthotics were made for patient today -Metatarsal padding on left foot was placed today -Did trial run with orthotics, he reported of no issues  Will have patient follow up as needed, as mentioned above.   Mort Sawyers, M.D. [place]

## 2017-04-09 LAB — KAPPA/LAMBDA LIGHT CHAINS
IG LAMBDA FREE LIGHT CHAIN: 12.7 mg/L (ref 5.7–26.3)
Ig Kappa Free Light Chain: 23.7 mg/L — ABNORMAL HIGH (ref 3.3–19.4)
KAPPA/LAMBDA FLC RATIO: 1.87 — AB (ref 0.26–1.65)

## 2017-04-10 ENCOUNTER — Other Ambulatory Visit: Payer: 59

## 2017-04-10 LAB — MULTIPLE MYELOMA PANEL, SERUM
ALBUMIN SERPL ELPH-MCNC: 3.4 g/dL (ref 2.9–4.4)
Albumin/Glob SerPl: 1.5 (ref 0.7–1.7)
Alpha 1: 0.2 g/dL (ref 0.0–0.4)
Alpha2 Glob SerPl Elph-Mcnc: 0.6 g/dL (ref 0.4–1.0)
B-Globulin SerPl Elph-Mcnc: 0.9 g/dL (ref 0.7–1.3)
Gamma Glob SerPl Elph-Mcnc: 0.7 g/dL (ref 0.4–1.8)
Globulin, Total: 2.4 g/dL (ref 2.2–3.9)
IGA/IMMUNOGLOBULIN A, SERUM: 133 mg/dL (ref 61–437)
IGM (IMMUNOGLOBIN M), SRM: 29 mg/dL (ref 20–172)
IgG, Qn, Serum: 632 mg/dL — ABNORMAL LOW (ref 700–1600)
M Protein SerPl Elph-Mcnc: 0.2 g/dL — ABNORMAL HIGH
Total Protein: 5.8 g/dL — ABNORMAL LOW (ref 6.0–8.5)

## 2017-04-14 ENCOUNTER — Ambulatory Visit (HOSPITAL_BASED_OUTPATIENT_CLINIC_OR_DEPARTMENT_OTHER): Payer: 59

## 2017-04-14 ENCOUNTER — Other Ambulatory Visit: Payer: Self-pay | Admitting: Family

## 2017-04-14 ENCOUNTER — Telehealth: Payer: Self-pay | Admitting: *Deleted

## 2017-04-14 ENCOUNTER — Ambulatory Visit (HOSPITAL_BASED_OUTPATIENT_CLINIC_OR_DEPARTMENT_OTHER)
Admission: RE | Admit: 2017-04-14 | Discharge: 2017-04-14 | Disposition: A | Discharge status: Home / Self Care | Payer: 59 | Source: Ambulatory Visit | Attending: Family | Admitting: Family

## 2017-04-14 VITALS — BP 104/60 | HR 73 | Temp 97.9°F | Resp 18

## 2017-04-14 DIAGNOSIS — C9000 Multiple myeloma not having achieved remission: Secondary | ICD-10-CM | POA: Diagnosis present

## 2017-04-14 DIAGNOSIS — D509 Iron deficiency anemia, unspecified: Secondary | ICD-10-CM | POA: Diagnosis not present

## 2017-04-14 DIAGNOSIS — R079 Chest pain, unspecified: Secondary | ICD-10-CM | POA: Diagnosis not present

## 2017-04-14 MED ORDER — SODIUM CHLORIDE 0.9 % IV SOLN
510.0000 mg | Freq: Once | INTRAVENOUS | Status: AC
  Administered 2017-04-14: 510 mg via INTRAVENOUS
  Filled 2017-04-14: qty 17

## 2017-04-14 MED ORDER — SODIUM CHLORIDE 0.9 % IV SOLN
Freq: Once | INTRAVENOUS | Status: AC
  Administered 2017-04-14: 15:00:00 via INTRAVENOUS

## 2017-04-14 NOTE — Telephone Encounter (Signed)
While patient was walking dog this morning, his dog and another dog "got into it" resulting in patient tripping and falling over the leash. Patient states he hit his ribs. He c/o generalized rib pain, no increase when breathing, however the pain increases with coughing.  Spoke to Judson Roch and she would like patient to have an xray today.  Patient aware of instruction, will have xray done today.

## 2017-04-14 NOTE — Patient Instructions (Signed)

## 2017-04-16 DIAGNOSIS — H5213 Myopia, bilateral: Secondary | ICD-10-CM | POA: Diagnosis not present

## 2017-04-17 ENCOUNTER — Other Ambulatory Visit: Payer: 59

## 2017-04-24 ENCOUNTER — Other Ambulatory Visit: Payer: 59

## 2017-04-27 ENCOUNTER — Other Ambulatory Visit: Payer: Self-pay | Admitting: *Deleted

## 2017-04-27 DIAGNOSIS — G8929 Other chronic pain: Secondary | ICD-10-CM

## 2017-04-27 DIAGNOSIS — I8290 Acute embolism and thrombosis of unspecified vein: Secondary | ICD-10-CM

## 2017-04-27 DIAGNOSIS — M7989 Other specified soft tissue disorders: Secondary | ICD-10-CM

## 2017-04-27 DIAGNOSIS — M545 Low back pain: Secondary | ICD-10-CM

## 2017-04-27 DIAGNOSIS — D472 Monoclonal gammopathy: Secondary | ICD-10-CM

## 2017-04-27 DIAGNOSIS — C9000 Multiple myeloma not having achieved remission: Secondary | ICD-10-CM

## 2017-04-27 MED ORDER — LENALIDOMIDE 10 MG PO CAPS: ORAL_CAPSULE | ORAL | 0 refills | Status: DC

## 2017-04-30 DIAGNOSIS — D225 Melanocytic nevi of trunk: Secondary | ICD-10-CM | POA: Diagnosis not present

## 2017-04-30 DIAGNOSIS — D234 Other benign neoplasm of skin of scalp and neck: Secondary | ICD-10-CM | POA: Diagnosis not present

## 2017-05-01 ENCOUNTER — Other Ambulatory Visit: Payer: 59

## 2017-05-02 ENCOUNTER — Other Ambulatory Visit: Payer: Self-pay | Admitting: Family

## 2017-05-02 DIAGNOSIS — M7989 Other specified soft tissue disorders: Secondary | ICD-10-CM

## 2017-05-02 DIAGNOSIS — I8290 Acute embolism and thrombosis of unspecified vein: Secondary | ICD-10-CM

## 2017-05-02 DIAGNOSIS — C9000 Multiple myeloma not having achieved remission: Secondary | ICD-10-CM

## 2017-05-05 ENCOUNTER — Other Ambulatory Visit: Payer: Self-pay | Admitting: Family

## 2017-05-08 ENCOUNTER — Other Ambulatory Visit: Payer: 59

## 2017-05-12 NOTE — Telephone Encounter (Signed)
Completed.

## 2017-05-15 ENCOUNTER — Other Ambulatory Visit: Payer: 59

## 2017-05-19 ENCOUNTER — Other Ambulatory Visit (HOSPITAL_BASED_OUTPATIENT_CLINIC_OR_DEPARTMENT_OTHER): Payer: 59

## 2017-05-19 ENCOUNTER — Ambulatory Visit (HOSPITAL_BASED_OUTPATIENT_CLINIC_OR_DEPARTMENT_OTHER): Payer: 59 | Admitting: Hematology & Oncology

## 2017-05-19 ENCOUNTER — Ambulatory Visit (HOSPITAL_BASED_OUTPATIENT_CLINIC_OR_DEPARTMENT_OTHER): Payer: 59

## 2017-05-19 VITALS — BP 113/63 | HR 73 | Temp 97.7°F | Resp 16 | Wt 164.1 lb

## 2017-05-19 DIAGNOSIS — C9000 Multiple myeloma not having achieved remission: Secondary | ICD-10-CM

## 2017-05-19 DIAGNOSIS — C9001 Multiple myeloma in remission: Secondary | ICD-10-CM

## 2017-05-19 DIAGNOSIS — M545 Low back pain: Secondary | ICD-10-CM

## 2017-05-19 LAB — CMP (CANCER CENTER ONLY)
ALBUMIN: 3.8 g/dL (ref 3.3–5.5)
ALT(SGPT): 38 U/L (ref 10–47)
AST: 36 U/L (ref 11–38)
Alkaline Phosphatase: 65 U/L (ref 26–84)
BUN, Bld: 11 mg/dL (ref 7–22)
CHLORIDE: 105 meq/L (ref 98–108)
CO2: 27 mEq/L (ref 18–33)
Calcium: 9 mg/dL (ref 8.0–10.3)
Creat: 1.2 mg/dl (ref 0.6–1.2)
Glucose, Bld: 125 mg/dL — ABNORMAL HIGH (ref 73–118)
POTASSIUM: 3.7 meq/L (ref 3.3–4.7)
Sodium: 139 mEq/L (ref 128–145)
TOTAL PROTEIN: 6.6 g/dL (ref 6.4–8.1)
Total Bilirubin: 0.8 mg/dl (ref 0.20–1.60)

## 2017-05-19 LAB — CBC WITH DIFFERENTIAL (CANCER CENTER ONLY)
BASO#: 0 10*3/uL (ref 0.0–0.2)
BASO%: 0.4 % (ref 0.0–2.0)
EOS ABS: 0.1 10*3/uL (ref 0.0–0.5)
EOS%: 5 % (ref 0.0–7.0)
HEMATOCRIT: 42.1 % (ref 38.7–49.9)
HEMOGLOBIN: 14.1 g/dL (ref 13.0–17.1)
LYMPH#: 1 10*3/uL (ref 0.9–3.3)
LYMPH%: 36.4 % (ref 14.0–48.0)
MCH: 31.1 pg (ref 28.0–33.4)
MCHC: 33.5 g/dL (ref 32.0–35.9)
MCV: 93 fL (ref 82–98)
MONO#: 0.3 10*3/uL (ref 0.1–0.9)
MONO%: 10.3 % (ref 0.0–13.0)
NEUT%: 47.9 % (ref 40.0–80.0)
NEUTROS ABS: 1.3 10*3/uL — AB (ref 1.5–6.5)
Platelets: 128 10*3/uL — ABNORMAL LOW (ref 145–400)
RBC: 4.53 10*6/uL (ref 4.20–5.70)
RDW: 18.8 % — AB (ref 11.1–15.7)
WBC: 2.6 10*3/uL — ABNORMAL LOW (ref 4.0–10.0)

## 2017-05-19 LAB — LACTATE DEHYDROGENASE: LDH: 203 U/L (ref 125–245)

## 2017-05-19 MED ORDER — ZOLEDRONIC ACID 4 MG/100ML IV SOLN
4.0000 mg | Freq: Once | INTRAVENOUS | Status: AC
  Administered 2017-05-19: 4 mg via INTRAVENOUS
  Filled 2017-05-19: qty 100

## 2017-05-19 NOTE — Progress Notes (Signed)
Hematology and Oncology Follow Up Visit  Jorge [name] [id number] 08-31-1955 61 y.o. 05/19/2017   Principle Diagnosis:  IgG Kappa myeloma - Hyperdiploid/+11 DVT of the LEFT and RIGHT leg   Current Therapy:   Revlimid 10 mg po q day - on hold due to cellulitis in foot, will old one more week and restart Zometa 4 mg IV q 6 weeks- next dose is 06/2017 Xarelto 10 mg PO daily   Interim History:  Jorge [name] is here today for follow-up. He is doing pretty well.  He does complain of some slight increase in mid back discomfort.  He has had kyphoplasty in that area.  He sees a doctor at [facility] for this.  He is back to see the doctor in a week or so.  I think we should probably get an MRI of his thoracic spine to see what is going on.  His myeloma numbers have looked good.  His last myeloma levels back in September showed an M spike of 0.2 g/dL.  His IgG level was 632 mg/dL.  His Kappa Lightchain was 2.4 mg/dL.  He is on Revlimid.  He is doing well with Revlimid.  He has had no problems with nausea or vomiting.  There is no itching.  He has had no constipation or diarrhea.  He has had no cough or shortness of breath.    He is exercising.  He is doing a lot of walking.  ECOG Performance Status: 1 - Symptomatic but completely ambulatory  Medications:  Allergies as of 05/19/2017   No Known Allergies     Medication List        Accurate as of 05/19/17  2:22 PM. Always use your most recent med list.          famciclovir 500 MG tablet Commonly known as:  FAMVIR Take 1 tablet (500 mg total) by mouth daily.   fentaNYL 25 MCG/HR patch Commonly known as:  DURAGESIC - dosed mcg/hr Place 1 patch (25 mcg total) onto the skin every other day.   lenalidomide 10 MG capsule Commonly known as:  REVLIMID TAKE 1 CAPSULE BY MOUTH EVERY DAY FOR 21 DAYS, THEN 7 DAYS OFF. [id number]   lidocaine 5 % Commonly known as:  LIDODERM PLAEC 1 PATCH ONTO THE SKIN DAILY.APPLY 1 PATCH TO THE MOST PAINFUL  AREA FOR 12 HR IN A 24 HR PERIOD   magnesium oxide 400 MG tablet Commonly known as:  MAG-OX Take 400 mg by mouth daily.   MITIGARE 0.6 MG Caps Generic drug:  Colchicine TAKE 1 CAPSULE (0.6 MG TOTAL) BY MOUTH DAILY. FIRST DAY TAKW 1 CAPSULE EVERY 8 HOURS.   ondansetron 8 MG disintegrating tablet Commonly known as:  ZOFRAN-ODT Take 1 tablet (8 mg total) by mouth every 8 (eight) hours as needed.   Oxycodone HCl 10 MG Tabs Take 1 tablet (10 mg total) by mouth every 4 (four) hours as needed.   polyethylene glycol powder powder Commonly known as:  MIRALAX Take 17 g by mouth daily.   PROBIOTIC PO Take 1 capsule by mouth daily.   rivaroxaban 10 MG Tabs tablet Commonly known as:  XARELTO Take 1 tablet (10 mg total) by mouth daily with supper.   XARELTO 20 MG Tabs tablet Generic drug:  rivaroxaban TAKE 1 TABLET BY MOUTH DAILY WITH SUPPER   rOPINIRole 0.25 MG tablet Commonly known as:  REQUIP TAKE 1 TABLET BY MOUTH 1 TO 3 HOURS BEFORE BED FOR 2 DAYS THEN INCREASE TO 2 TABS  senna 8.6 MG Tabs tablet Commonly known as:  SENOKOT Take 2 tablets (17.2 mg total) by mouth daily.   sertraline 100 MG tablet Commonly known as:  ZOLOFT Take 100 mg by mouth at bedtime.   Vitamin D-3 5000 units Tabs Take 5,000 mg by mouth daily.   zolpidem 10 MG tablet Commonly known as:  AMBIEN Take 1 tablet (10 mg total) by mouth at bedtime as needed.       Allergies: No Known Allergies  Past Medical History, Surgical history, Social history, and Family History were reviewed and updated.  Review of Systems: All other 10 point review of systems is negative.   Physical Exam:  weight is 164 lb 1.9 oz (74.4 kg). His oral temperature is 97.7 F (36.5 C). His blood pressure is 113/63 and his pulse is 73. His respiration is 16 and oxygen saturation is 100%.   Wt Readings from Last 3 Encounters:  05/19/17 164 lb 1.9 oz (74.4 kg)  04/08/17 160 lb (72.6 kg)  04/07/17 164 lb (74.4 kg)     Physical Exam  Constitutional: He is oriented to person, place, and time.  HENT:  Head: Normocephalic and atraumatic.  Mouth/Throat: Oropharynx is clear and moist.  Eyes: EOM are normal. Pupils are equal, round, and reactive to light.  Neck: Normal range of motion.  Cardiovascular: Normal rate, regular rhythm and normal heart sounds.  Pulmonary/Chest: Effort normal and breath sounds normal.  Abdominal: Soft. Bowel sounds are normal.  Musculoskeletal: Normal range of motion. He exhibits no edema, tenderness or deformity.  Lymphadenopathy:    He has no cervical adenopathy.  Neurological: He is alert and oriented to person, place, and time.  Skin: Skin is warm and dry. No rash noted. No erythema.  Psychiatric: He has a normal mood and affect. His behavior is normal. Judgment and thought content normal.  Vitals reviewed.    Lab Results  Component Value Date   WBC 2.6 (L) 05/19/2017   HGB 14.1 05/19/2017   HCT 42.1 05/19/2017   MCV 93 05/19/2017   PLT 128 (L) 05/19/2017   Lab Results  Component Value Date   FERRITIN 34 04/07/2017   IRON 40 (L) 04/07/2017   TIBC 386 04/07/2017   UIBC 346 04/07/2017   IRONPCTSAT 10 (L) 04/07/2017   Lab Results  Component Value Date   RETICCTPCT 1.1 03/05/2017   RBC 4.53 05/19/2017   Lab Results  Component Value Date   KAPLAMBRATIO 1.87 (H) 04/07/2017   Lab Results  Component Value Date   IGGSERUM 632 (L) 04/07/2017   IGA 106 03/02/2017   IGMSERUM 29 04/07/2017   Lab Results  Component Value Date   TOTALPROTELP 5.6 (L) 03/02/2017   MSPIKE 0.5 (H) 07/28/2016     Chemistry      Component Value Date/Time   NA 139 05/19/2017 1003   NA 140 10/03/2016 0845   K 3.7 05/19/2017 1003   K 4.3 10/03/2016 0845   CL 105 05/19/2017 1003   CO2 27 05/19/2017 1003   CO2 26 10/03/2016 0845   BUN 11 05/19/2017 1003   BUN 11.9 10/03/2016 0845   CREATININE 1.2 05/19/2017 1003   CREATININE 0.9 10/03/2016 0845      Component Value  Date/Time   CALCIUM 9.0 05/19/2017 1003   CALCIUM 9.9 10/03/2016 0845   ALKPHOS 65 05/19/2017 1003   ALKPHOS 80 10/03/2016 0845   AST 36 05/19/2017 1003   AST 28 10/03/2016 0845   ALT 38 05/19/2017 1003  ALT 26 10/03/2016 0845   BILITOT 0.80 05/19/2017 1003   BILITOT 0.48 10/03/2016 0845      Impression and Plan: Mr. Fenech is a pleasant 61 yo caucasian gentleman with IgG kappa myeloma. He underwent chemotherapy with RVD, followed by autologous stem cell transplant in February 2018.   He will get his Zometa today.  We will see about a MRI of his thoracic spine.  We will plan to see him back in 6 more weeks.   [name], MD 11/6/20182:22 PM is

## 2017-05-19 NOTE — Patient Instructions (Signed)

## 2017-05-20 LAB — IGG, IGA, IGM
IGG (IMMUNOGLOBIN G), SERUM: 667 mg/dL — AB (ref 700–1600)
IgA, Qn, Serum: 145 mg/dL (ref 61–437)
IgM, Qn, Serum: 24 mg/dL (ref 20–172)

## 2017-05-20 LAB — KAPPA/LAMBDA LIGHT CHAINS
IG LAMBDA FREE LIGHT CHAIN: 10.3 mg/L (ref 5.7–26.3)
Ig Kappa Free Light Chain: 19.3 mg/L (ref 3.3–19.4)
Kappa/Lambda FluidC Ratio: 1.87 — ABNORMAL HIGH (ref 0.26–1.65)

## 2017-05-21 ENCOUNTER — Other Ambulatory Visit: Payer: Self-pay | Admitting: *Deleted

## 2017-05-21 DIAGNOSIS — I8290 Acute embolism and thrombosis of unspecified vein: Secondary | ICD-10-CM

## 2017-05-21 DIAGNOSIS — M7989 Other specified soft tissue disorders: Secondary | ICD-10-CM

## 2017-05-21 DIAGNOSIS — M545 Low back pain, unspecified: Secondary | ICD-10-CM

## 2017-05-21 DIAGNOSIS — D472 Monoclonal gammopathy: Secondary | ICD-10-CM

## 2017-05-21 DIAGNOSIS — G8929 Other chronic pain: Secondary | ICD-10-CM

## 2017-05-21 DIAGNOSIS — C9000 Multiple myeloma not having achieved remission: Secondary | ICD-10-CM

## 2017-05-21 MED ORDER — LENALIDOMIDE 10 MG PO CAPS: ORAL_CAPSULE | ORAL | 0 refills | Status: DC

## 2017-05-22 ENCOUNTER — Other Ambulatory Visit: Payer: 59

## 2017-05-22 LAB — PROTEIN ELECTROPHORESIS, SERUM, WITH REFLEX
A/G Ratio: 1.7 (ref 0.7–1.7)
ALBUMIN: 3.8 g/dL (ref 2.9–4.4)
ALPHA 2: 0.5 g/dL (ref 0.4–1.0)
Alpha 1: 0.2 g/dL (ref 0.0–0.4)
BETA: 0.9 g/dL (ref 0.7–1.3)
GLOBULIN, TOTAL: 2.3 g/dL (ref 2.2–3.9)
Gamma Globulin: 0.7 g/dL (ref 0.4–1.8)
Interpretation(See Below): 0
M-Spike, %: 0.2 g/dL — ABNORMAL HIGH
Total Protein: 6.1 g/dL (ref 6.0–8.5)

## 2017-05-26 ENCOUNTER — Ambulatory Visit (HOSPITAL_COMMUNITY)
Admission: RE | Admit: 2017-05-26 | Discharge: 2017-05-26 | Disposition: A | Discharge status: Home / Self Care | Payer: 59 | Source: Ambulatory Visit | Attending: Hematology & Oncology | Admitting: Hematology & Oncology

## 2017-05-26 ENCOUNTER — Other Ambulatory Visit: Payer: Self-pay | Admitting: Family

## 2017-05-26 DIAGNOSIS — C9000 Multiple myeloma not having achieved remission: Secondary | ICD-10-CM | POA: Diagnosis not present

## 2017-05-26 DIAGNOSIS — C9001 Multiple myeloma in remission: Secondary | ICD-10-CM | POA: Insufficient documentation

## 2017-05-28 ENCOUNTER — Telehealth: Payer: Self-pay

## 2017-05-28 NOTE — Telephone Encounter (Signed)
Called and spoke with patient letting him know, per Dr. Marin Olp,  his MRI doesn't show anything new. There may be some weakness at T9 vertebral body, but there is nothing pressing on the spinal cord.

## 2017-05-29 ENCOUNTER — Other Ambulatory Visit: Payer: 59

## 2017-06-05 ENCOUNTER — Other Ambulatory Visit: Payer: 59

## 2017-06-10 ENCOUNTER — Other Ambulatory Visit: Payer: Self-pay | Admitting: Family

## 2017-06-12 ENCOUNTER — Other Ambulatory Visit: Payer: 59

## 2017-06-18 ENCOUNTER — Other Ambulatory Visit: Payer: Self-pay | Admitting: *Deleted

## 2017-06-18 DIAGNOSIS — C9000 Multiple myeloma not having achieved remission: Secondary | ICD-10-CM

## 2017-06-18 DIAGNOSIS — D472 Monoclonal gammopathy: Secondary | ICD-10-CM

## 2017-06-18 DIAGNOSIS — I8290 Acute embolism and thrombosis of unspecified vein: Secondary | ICD-10-CM

## 2017-06-18 DIAGNOSIS — M545 Low back pain, unspecified: Secondary | ICD-10-CM

## 2017-06-18 DIAGNOSIS — G8929 Other chronic pain: Secondary | ICD-10-CM

## 2017-06-18 DIAGNOSIS — M7989 Other specified soft tissue disorders: Secondary | ICD-10-CM

## 2017-06-18 MED ORDER — LENALIDOMIDE 10 MG PO CAPS: ORAL_CAPSULE | ORAL | 0 refills | Status: DC

## 2017-06-19 ENCOUNTER — Other Ambulatory Visit: Payer: 59

## 2017-06-26 ENCOUNTER — Other Ambulatory Visit: Payer: 59

## 2017-06-26 DIAGNOSIS — C9000 Multiple myeloma not having achieved remission: Secondary | ICD-10-CM | POA: Diagnosis not present

## 2017-06-26 DIAGNOSIS — S22060G Wedge compression fracture of T7-T8 vertebra, subsequent encounter for fracture with delayed healing: Secondary | ICD-10-CM | POA: Diagnosis not present

## 2017-06-26 DIAGNOSIS — S22050G Wedge compression fracture of T5-T6 vertebra, subsequent encounter for fracture with delayed healing: Secondary | ICD-10-CM | POA: Diagnosis not present

## 2017-06-30 ENCOUNTER — Other Ambulatory Visit: Payer: Self-pay | Admitting: *Deleted

## 2017-06-30 ENCOUNTER — Other Ambulatory Visit: Payer: Self-pay

## 2017-06-30 ENCOUNTER — Ambulatory Visit (HOSPITAL_BASED_OUTPATIENT_CLINIC_OR_DEPARTMENT_OTHER): Payer: 59

## 2017-06-30 ENCOUNTER — Other Ambulatory Visit (HOSPITAL_BASED_OUTPATIENT_CLINIC_OR_DEPARTMENT_OTHER): Payer: 59

## 2017-06-30 ENCOUNTER — Ambulatory Visit (HOSPITAL_BASED_OUTPATIENT_CLINIC_OR_DEPARTMENT_OTHER): Payer: 59 | Admitting: Hematology & Oncology

## 2017-06-30 VITALS — BP 107/69 | HR 61 | Temp 97.5°F | Resp 17 | Wt 167.8 lb

## 2017-06-30 DIAGNOSIS — D500 Iron deficiency anemia secondary to blood loss (chronic): Secondary | ICD-10-CM

## 2017-06-30 DIAGNOSIS — M7989 Other specified soft tissue disorders: Secondary | ICD-10-CM

## 2017-06-30 DIAGNOSIS — C9001 Multiple myeloma in remission: Secondary | ICD-10-CM | POA: Diagnosis not present

## 2017-06-30 DIAGNOSIS — C9000 Multiple myeloma not having achieved remission: Secondary | ICD-10-CM

## 2017-06-30 DIAGNOSIS — D472 Monoclonal gammopathy: Secondary | ICD-10-CM

## 2017-06-30 DIAGNOSIS — G8929 Other chronic pain: Secondary | ICD-10-CM

## 2017-06-30 DIAGNOSIS — I8290 Acute embolism and thrombosis of unspecified vein: Secondary | ICD-10-CM

## 2017-06-30 DIAGNOSIS — G4701 Insomnia due to medical condition: Secondary | ICD-10-CM

## 2017-06-30 DIAGNOSIS — M545 Low back pain, unspecified: Secondary | ICD-10-CM

## 2017-06-30 LAB — IRON AND TIBC
%SAT: 15 % — ABNORMAL LOW (ref 20–55)
Iron: 66 ug/dL (ref 42–163)
TIBC: 428 ug/dL — ABNORMAL HIGH (ref 202–409)
UIBC: 362 ug/dL (ref 117–376)

## 2017-06-30 LAB — CBC WITH DIFFERENTIAL (CANCER CENTER ONLY)
BASO#: 0 10*3/uL (ref 0.0–0.2)
BASO%: 1.1 % (ref 0.0–2.0)
EOS%: 5 % (ref 0.0–7.0)
Eosinophils Absolute: 0.1 10*3/uL (ref 0.0–0.5)
HEMATOCRIT: 36.9 % — AB (ref 38.7–49.9)
HGB: 12.3 g/dL — ABNORMAL LOW (ref 13.0–17.1)
LYMPH#: 1 10*3/uL (ref 0.9–3.3)
LYMPH%: 36.7 % (ref 14.0–48.0)
MCH: 30.7 pg (ref 28.0–33.4)
MCHC: 33.3 g/dL (ref 32.0–35.9)
MCV: 92 fL (ref 82–98)
MONO#: 0.6 10*3/uL (ref 0.1–0.9)
MONO%: 19.9 % — ABNORMAL HIGH (ref 0.0–13.0)
NEUT#: 1.1 10*3/uL — ABNORMAL LOW (ref 1.5–6.5)
NEUT%: 37.3 % — AB (ref 40.0–80.0)
PLATELETS: 158 10*3/uL (ref 145–400)
RBC: 4.01 10*6/uL — ABNORMAL LOW (ref 4.20–5.70)
RDW: 14.9 % (ref 11.1–15.7)
WBC: 2.8 10*3/uL — ABNORMAL LOW (ref 4.0–10.0)

## 2017-06-30 LAB — CMP (CANCER CENTER ONLY)
ALBUMIN: 3.7 g/dL (ref 3.3–5.5)
ALT(SGPT): 43 U/L (ref 10–47)
AST: 39 U/L — AB (ref 11–38)
Alkaline Phosphatase: 57 U/L (ref 26–84)
BILIRUBIN TOTAL: 0.8 mg/dL (ref 0.20–1.60)
BUN, Bld: 13 mg/dL (ref 7–22)
CHLORIDE: 105 meq/L (ref 98–108)
CO2: 27 mEq/L (ref 18–33)
CREATININE: 1 mg/dL (ref 0.6–1.2)
Calcium: 9 mg/dL (ref 8.0–10.3)
Glucose, Bld: 89 mg/dL (ref 73–118)
Potassium: 4 mEq/L (ref 3.3–4.7)
Sodium: 143 mEq/L (ref 128–145)
TOTAL PROTEIN: 6.5 g/dL (ref 6.4–8.1)

## 2017-06-30 LAB — FERRITIN: Ferritin: 13 ng/ml — ABNORMAL LOW (ref 22–316)

## 2017-06-30 MED ORDER — SODIUM CHLORIDE 0.9 % IV SOLN
Freq: Once | INTRAVENOUS | Status: AC
Start: 2017-06-30 — End: 2017-06-30
  Administered 2017-06-30: 12:00:00 via INTRAVENOUS

## 2017-06-30 MED ORDER — FENTANYL 25 MCG/HR TD PT72: 25.0000 ug | MEDICATED_PATCH | TRANSDERMAL | 0 refills | Status: DC

## 2017-06-30 MED ORDER — ZOLEDRONIC ACID 4 MG/100ML IV SOLN
4.0000 mg | Freq: Once | INTRAVENOUS | Status: AC
  Administered 2017-06-30: 4 mg via INTRAVENOUS
  Filled 2017-06-30: qty 100

## 2017-06-30 MED ORDER — ZOLPIDEM TARTRATE 10 MG PO TABS: 10.0000 mg | ORAL_TABLET | Freq: Every evening | ORAL | 3 refills | Status: DC | PRN

## 2017-06-30 NOTE — Patient Instructions (Signed)

## 2017-06-30 NOTE — Progress Notes (Signed)
Hematology and Oncology Follow Up Visit  Jorge [name] [id number] 12/25/1955 61 y.o. 06/30/2017   Principle Diagnosis:  IgG Kappa myeloma - Hyperdiploid/+11 DVT of the LEFT and RIGHT leg   Current Therapy:   Revlimid 10 mg po q day  Zometa 4 mg IV q 6 weeks- next dose is 07/2017 Xarelto 10 mg PO daily   Interim History:  Jorge [name] is here today for follow-up. He is doing pretty well.  He does complain of some slight increase in mid back discomfort.  He has had kyphoplasty in that area.  He sees a doctor at [facility] for this.  He is back to see the doctor in a week or so.  We did do an MRI of his back.  The MRI did not show any active myeloma.  There were no fractures.  I does think that the pain that he has might be chronic from past myeloma damage.    His myeloma numbers have looked good.  His last myeloma levels back in November showed an M spike of 0.2 g/dL.  His IgG level was 667 mg/dL.  His Kappa Lightchain was 1.9 mg/dL.  He is on Revlimid.  He is doing well with Revlimid.  He has had no problems with nausea or vomiting.  There is no itching.  He has had no constipation or diarrhea.  He has had no cough or shortness of breath.    He is exercising.  He is doing a lot of walking.  ECOG Performance Status: 1 - Symptomatic but completely ambulatory  Medications:  Allergies as of 06/30/2017   No Known Allergies     Medication List        Accurate as of 06/30/17 11:33 AM. Always use your most recent med list.          famciclovir 500 MG tablet Commonly known as:  FAMVIR Take 1 tablet (500 mg total) by mouth daily.   fentaNYL 25 MCG/HR patch Commonly known as:  DURAGESIC - dosed mcg/hr Place 1 patch (25 mcg total) onto the skin every other day.   lenalidomide 10 MG capsule Commonly known as:  REVLIMID TAKE 1 CAPSULE BY MOUTH EVERY DAY FOR 21 DAYS, THEN 7 DAYS OFF. UUVO#[id number]   lidocaine 5 % Commonly known as:  LIDODERM PLAEC 1 PATCH ONTO THE SKIN DAILY.APPLY 1  PATCH TO THE MOST PAINFUL AREA FOR 12 HR IN A 24 HR PERIOD   magnesium oxide 400 MG tablet Commonly known as:  MAG-OX Take 400 mg by mouth daily.   MITIGARE 0.6 MG Caps Generic drug:  Colchicine TAKE 1 CAPSULE (0.6 MG TOTAL) BY MOUTH DAILY. FIRST DAY TAKW 1 CAPSULE EVERY 8 HOURS.   ondansetron 8 MG disintegrating tablet Commonly known as:  ZOFRAN-ODT Take 1 tablet (8 mg total) by mouth every 8 (eight) hours as needed.   Oxycodone HCl 10 MG Tabs Take 1 tablet (10 mg total) by mouth every 4 (four) hours as needed.   polyethylene glycol powder powder Commonly known as:  MIRALAX Take 17 g by mouth daily.   PROBIOTIC PO Take 1 capsule by mouth daily.   rivaroxaban 10 MG Tabs tablet Commonly known as:  XARELTO Take 1 tablet (10 mg total) by mouth daily with supper.   XARELTO 20 MG Tabs tablet Generic drug:  rivaroxaban TAKE 1 TABLET BY MOUTH DAILY WITH SUPPER   rOPINIRole 0.25 MG tablet Commonly known as:  REQUIP TAKE 1 TABLET BY MOUTH 1 TO 3 HOURS BEFORE BED FOR  2 DAYS THEN INCREASE TO 2 TABS   senna 8.6 MG Tabs tablet Commonly known as:  SENOKOT Take 2 tablets (17.2 mg total) by mouth daily.   sertraline 100 MG tablet Commonly known as:  ZOLOFT Take 100 mg by mouth at bedtime.   Vitamin D-3 5000 units Tabs Take 5,000 mg by mouth daily.   zolpidem 10 MG tablet Commonly known as:  AMBIEN Take 1 tablet (10 mg total) by mouth at bedtime as needed.       Allergies: No Known Allergies  Past Medical History, Surgical history, Social history, and Family History were reviewed and updated.  Review of Systems: All other 10 point review of systems is negative.   Physical Exam:  weight is 167 lb 12.8 oz (76.1 kg). His oral temperature is 97.5 F (36.4 C) (abnormal). His blood pressure is 107/69 and his pulse is 61. His respiration is 17 and oxygen saturation is 100%.   Wt Readings from Last 3 Encounters:  06/30/17 167 lb 12.8 oz (76.1 kg)  05/19/17 164 lb 1.9 oz  (74.4 kg)  04/08/17 160 lb (72.6 kg)    Physical Exam  Constitutional: He is oriented to person, place, and time.  HENT:  Head: Normocephalic and atraumatic.  Mouth/Throat: Oropharynx is clear and moist.  Eyes: EOM are normal. Pupils are equal, round, and reactive to light.  Neck: Normal range of motion.  Cardiovascular: Normal rate, regular rhythm and normal heart sounds.  Pulmonary/Chest: Effort normal and breath sounds normal.  Abdominal: Soft. Bowel sounds are normal.  Musculoskeletal: Normal range of motion. He exhibits no edema, tenderness or deformity.  Lymphadenopathy:    He has no cervical adenopathy.  Neurological: He is alert and oriented to person, place, and time.  Skin: Skin is warm and dry. No rash noted. No erythema.  Psychiatric: He has a normal mood and affect. His behavior is normal. Judgment and thought content normal.  Vitals reviewed.    Lab Results  Component Value Date   WBC 2.8 (L) 06/30/2017   HGB 12.3 (L) 06/30/2017   HCT 36.9 (L) 06/30/2017   MCV 92 06/30/2017   PLT 158 06/30/2017   Lab Results  Component Value Date   FERRITIN 34 04/07/2017   IRON 40 (L) 04/07/2017   TIBC 386 04/07/2017   UIBC 346 04/07/2017   IRONPCTSAT 10 (L) 04/07/2017   Lab Results  Component Value Date   RETICCTPCT 1.1 03/05/2017   RBC 4.01 (L) 06/30/2017   Lab Results  Component Value Date   KAPLAMBRATIO 1.87 (H) 05/19/2017   Lab Results  Component Value Date   IGGSERUM 667 (L) 05/19/2017   IGA 106 03/02/2017   IGMSERUM 24 05/19/2017   Lab Results  Component Value Date   TOTALPROTELP 5.6 (L) 03/02/2017   MSPIKE 0.2 (H) 05/19/2017     Chemistry      Component Value Date/Time   NA 143 06/30/2017 1049   NA 140 10/03/2016 0845   K 4.0 06/30/2017 1049   K 4.3 10/03/2016 0845   CL 105 06/30/2017 1049   CO2 27 06/30/2017 1049   CO2 26 10/03/2016 0845   BUN 13 06/30/2017 1049   BUN 11.9 10/03/2016 0845   CREATININE 1.0 06/30/2017 1049   CREATININE 0.9  10/03/2016 0845      Component Value Date/Time   CALCIUM 9.0 06/30/2017 1049   CALCIUM 9.9 10/03/2016 0845   ALKPHOS 57 06/30/2017 1049   ALKPHOS 80 10/03/2016 0845   AST 39 (H) 06/30/2017  1049   AST 28 10/03/2016 0845   ALT 43 06/30/2017 1049   ALT 26 10/03/2016 0845   BILITOT 0.80 06/30/2017 1049   BILITOT 0.48 10/03/2016 0845      Impression and Plan: Mr. Lotter is a pleasant 61 yo caucasian gentleman with IgG kappa myeloma. He underwent chemotherapy with RVD, followed by autologous stem cell transplant in February 2018.   He will get his Zometa today.  We will plan to get him back in 6 more weeks.  I want to make sure that we follow-up with his Zometa.  I think this is helpful for him.  [name], MD 12/18/201811:33 AM is

## 2017-07-01 ENCOUNTER — Telehealth: Payer: Self-pay | Admitting: *Deleted

## 2017-07-01 LAB — KAPPA/LAMBDA LIGHT CHAINS
Ig Kappa Free Light Chain: 21.9 mg/L — ABNORMAL HIGH (ref 3.3–19.4)
Ig Lambda Free Light Chain: 9.9 mg/L (ref 5.7–26.3)
Kappa/Lambda FluidC Ratio: 2.21 — ABNORMAL HIGH (ref 0.26–1.65)

## 2017-07-01 LAB — IGG, IGA, IGM
IGG (IMMUNOGLOBIN G), SERUM: 681 mg/dL — AB (ref 700–1600)
IgA, Qn, Serum: 149 mg/dL (ref 61–437)
IgM, Qn, Serum: 27 mg/dL (ref 20–172)

## 2017-07-01 NOTE — Telephone Encounter (Addendum)
Patient aware of results. Appointment made  ----- Message from [name], MD sent at 07/01/2017  6:07 AM EST ----- Call - iron is low again!!  Please set up 1 dose of Injectafer!!!!  Laurey Arrow

## 2017-07-03 ENCOUNTER — Other Ambulatory Visit: Payer: 59

## 2017-07-03 LAB — PROTEIN ELECTROPHORESIS, SERUM, WITH REFLEX
A/G Ratio: 1.7 (ref 0.7–1.7)
ALPHA 1: 0.2 g/dL (ref 0.0–0.4)
ALPHA 2: 0.5 g/dL (ref 0.4–1.0)
Albumin: 3.8 g/dL (ref 2.9–4.4)
Beta: 0.9 g/dL (ref 0.7–1.3)
GAMMA GLOBULIN: 0.6 g/dL (ref 0.4–1.8)
Globulin, Total: 2.3 g/dL (ref 2.2–3.9)
INTERPRETATION(SEE BELOW): 0
M-SPIKE, %: 0.1 g/dL — AB
Total Protein: 6.1 g/dL (ref 6.0–8.5)

## 2017-07-10 ENCOUNTER — Other Ambulatory Visit: Payer: 59

## 2017-07-10 ENCOUNTER — Other Ambulatory Visit: Payer: Self-pay

## 2017-07-10 ENCOUNTER — Ambulatory Visit (HOSPITAL_BASED_OUTPATIENT_CLINIC_OR_DEPARTMENT_OTHER): Payer: 59

## 2017-07-10 VITALS — BP 109/53 | HR 78 | Temp 97.7°F | Resp 18

## 2017-07-10 DIAGNOSIS — C9000 Multiple myeloma not having achieved remission: Secondary | ICD-10-CM | POA: Diagnosis not present

## 2017-07-10 MED ORDER — SODIUM CHLORIDE 0.9 % IV SOLN: 6.0000 mg/kg | INTRAVENOUS | Status: DC

## 2017-07-10 MED ORDER — FILGRASTIM 480 MCG/0.8ML IJ SOSY: 780.0000 ug | PREFILLED_SYRINGE | Freq: Once | INTRAMUSCULAR | Status: DC

## 2017-07-10 MED ORDER — SODIUM CHLORIDE 0.9% FLUSH
3.0000 mL | Freq: Once | INTRAVENOUS | Status: DC | PRN
  Filled 2017-07-10: qty 10

## 2017-07-10 MED ORDER — SODIUM CHLORIDE 0.9 % IV SOLN
510.0000 mg | Freq: Once | INTRAVENOUS | Status: AC
  Administered 2017-07-10: 510 mg via INTRAVENOUS
  Filled 2017-07-10: qty 17

## 2017-07-10 MED ORDER — ALTEPLASE 2 MG IJ SOLR
2.0000 mg | Freq: Once | INTRAMUSCULAR | Status: DC | PRN
  Filled 2017-07-10: qty 2

## 2017-07-10 MED ORDER — HEPARIN SOD (PORK) LOCK FLUSH 100 UNIT/ML IV SOLN
500.0000 [IU] | Freq: Once | INTRAVENOUS | Status: DC | PRN
  Filled 2017-07-10: qty 5

## 2017-07-10 MED ORDER — SODIUM CHLORIDE 0.9% FLUSH
10.0000 mL | INTRAVENOUS | Status: DC | PRN
  Filled 2017-07-10: qty 10

## 2017-07-10 MED ORDER — HEPARIN SOD (PORK) LOCK FLUSH 100 UNIT/ML IV SOLN
250.0000 [IU] | Freq: Once | INTRAVENOUS | Status: DC | PRN
  Filled 2017-07-10: qty 5

## 2017-07-10 MED ORDER — SODIUM CHLORIDE 0.9 % IV SOLN
Freq: Once | INTRAVENOUS | Status: AC
  Administered 2017-07-10: 13:00:00 via INTRAVENOUS

## 2017-07-10 NOTE — Patient Instructions (Signed)

## 2017-07-15 ENCOUNTER — Other Ambulatory Visit: Payer: Self-pay | Admitting: *Deleted

## 2017-07-15 DIAGNOSIS — G8929 Other chronic pain: Secondary | ICD-10-CM

## 2017-07-15 DIAGNOSIS — D472 Monoclonal gammopathy: Secondary | ICD-10-CM

## 2017-07-15 DIAGNOSIS — M545 Low back pain, unspecified: Secondary | ICD-10-CM

## 2017-07-15 DIAGNOSIS — I8290 Acute embolism and thrombosis of unspecified vein: Secondary | ICD-10-CM

## 2017-07-15 DIAGNOSIS — M7989 Other specified soft tissue disorders: Secondary | ICD-10-CM

## 2017-07-15 DIAGNOSIS — C9000 Multiple myeloma not having achieved remission: Secondary | ICD-10-CM

## 2017-07-15 MED ORDER — LENALIDOMIDE 10 MG PO CAPS: ORAL_CAPSULE | ORAL | 0 refills | Status: DC

## 2017-07-16 ENCOUNTER — Encounter: Payer: Self-pay | Admitting: Medical

## 2017-07-16 ENCOUNTER — Ambulatory Visit (INDEPENDENT_AMBULATORY_CARE_PROVIDER_SITE_OTHER): Payer: 59 | Admitting: Medical

## 2017-07-16 VITALS — BP 102/70 | HR 64 | Temp 98.0°F | Resp 16 | Ht 67.0 in | Wt 168.0 lb

## 2017-07-16 DIAGNOSIS — G4700 Insomnia, unspecified: Secondary | ICD-10-CM | POA: Diagnosis not present

## 2017-07-16 NOTE — Progress Notes (Signed)
Subjective:    Patient ID: Jorge [name], male    DOB: 09-Jun-1956, 62 y.o.   MRN: [id number]  HPI   Pt in for follow up.   Pt has history of insomnia. Pt had been [place] for years. More than 5 years or so per pt.  Pt recently taking 5 mg mostly but occaionaly has to take hole tablet.  No side effects using ambien in the past. He is aware of potential side effects.  Pt had flu vaccine this year.  Pt also on sertraline for mood. Pt att[facility].    Review of Systems  Constitutional: Negative for chills, fatigue and fever.  Respiratory: Negative for cough, chest tightness, shortness of breath and wheezing.   Cardiovascular: Negative for chest pain and palpitations.  Gastrointestinal: Negative for abdominal pain.  Musculoskeletal: Negative for back pain, gait problem and neck pain.  Skin: Negative for rash.  Neurological: Negative for dizziness, speech difficulty, weakness and headaches.  Hematological: Negative for adenopathy. Does not bruise/bleed easily.  Psychiatric/Behavioral: Negative for behavioral problems and confusion.    Past Medical History:  Diagnosis Date  . Anxiety   . Bone metastasis ([place])   . Chest cold 05/19/2016   productive cough  -- started on antibiotic  . Chronic back pain    due to bone mets from myeloma  . Cough   . Depression   . GERD (gastroesophageal reflux disease)   . Hiatal hernia   . History of chicken pox   . History of concussion    age 16 -- no residual  . History of DVT of lower extremity 03/21/2016  treated and completed w/ xarelto   per doppler left extensive occlusion common femoral, femoral, and popliteal veins and right partial occlusion common femoral and profunda femoral veins/  last doppler 06-04-2016 no evidence acute or chronic dvt noted either leg   . History of radiation therapy 06/09/16-06/23/16   lower thoracic spine 25 Gy in 10 fractions  . Mouth ulcers    secondary to radiation  . Multiple myeloma ([place]) dx  02/22/2016 via bone marrow bx---  oncologist-  [name]   IgG Kappa-- Hyperdiploid/ +11 w/ bone mets--  current treatment chemotherapy (started 08/ 2017)and pallitive radiation to back started 06-09-2016  . Renal calculus, right   . Wears contact lenses      Social History   Socioeconomic History  . Marital status: Married    Spouse name: Not on file  . Number of children: 3  . Years of education: Not on file  . Highest education level: Not on file  Social Needs  . Financial resource strain: Not on file  . Food insecurity - worry: Not on file  . Food insecurity - inability: Not on file  . Transportation needs - medical: Not on file  . Transportation needs - non-medical: Not on file  Occupational History  . Not on file  Tobacco Use  . Smoking status: Former Smoker    Packs/day: 1.00    Years: 7.00    Pack years: 7.00    Types: Cigarettes    Last attempt to quit: 11/02/1980    Years since quitting: 36.7  . Smokeless tobacco: Never Used  Substance and Sexual Activity  . Alcohol use: Yes    Comment: occasional  . Drug use: No  . Sexual activity: No  Other Topics Concern  . Not on file  Social History Narrative  . Not on file    Past Surgical History:  Procedure Laterality Date  . COLONOSCOPY  [id number]  . CYSTOSCOPY W/ URETERAL STENT PLACEMENT Right 06/20/2016   Procedure: CYSTOSCOPY WITH STENT REPLACEMENT;  Surgeon: Kathie Rhodes, MD;  Location: [facility];  Service: Urology;  Laterality: Right;  . CYSTOSCOPY WITH RETROGRADE PYELOGRAM, URETEROSCOPY AND STENT PLACEMENT Right 05/30/2016   Procedure: CYSTOSCOPY WITH RETROGRADE PYELOGRAM, URETEROSCOPY AND STENT PLACEMENT,DILITATION URETERAL STRICTURE;  Surgeon: Kathie Rhodes, MD;  Location: [facility];  Service: Urology;  Laterality: Right;  . CYSTOSCOPY/RETROGRADE/URETEROSCOPY/STONE EXTRACTION WITH BASKET Right 06/20/2016   Procedure: CYSTOSCOPY/URETEROSCOPY/STONE EXTRACTION WITH BASKET;  Surgeon: Kathie Rhodes, MD;   Location: [facility];  Service: Urology;  Laterality: Right;  . HOLMIUM LASER APPLICATION Right 24/08/3534   Procedure: HOLMIUM LASER APPLICATION;  Surgeon: Kathie Rhodes, MD;  Location: [facility];  Service: Urology;  Laterality: Right;  . IR GENERIC HISTORICAL  02/11/2016   IR RADIOLOGIST EVAL & MGMT 02/11/2016 [facility]  . IR GENERIC HISTORICAL  02/15/2016   IR BONE TUMOR(S)RF ABLATION 02/15/2016 Luanne Bras, MD [facility]  . IR GENERIC HISTORICAL  02/15/2016   IR BONE TUMOR(S)RF ABLATION 02/15/2016 Luanne Bras, MD [facility]  . IR GENERIC HISTORICAL  02/15/2016   IR BONE TUMOR(S)RF ABLATION 02/15/2016 Luanne Bras, MD [facility]  . IR GENERIC HISTORICAL  02/15/2016   IR KYPHO THORACIC WITH BONE BIOPSY 02/15/2016 Luanne Bras, MD [facility]  . IR GENERIC HISTORICAL  02/15/2016   IR KYPHO THORACIC WITH BONE BIOPSY 02/15/2016 Luanne Bras, MD [facility]  . IR GENERIC HISTORICAL  02/15/2016   IR VERTEBROPLASTY CERV/THOR BX INC UNI/BIL INC/INJECT/IMAGING 02/15/2016 Luanne Bras, MD [facility]  . IR GENERIC HISTORICAL  03/13/2016   IR KYPHO EA ADDL LEVEL THORACIC OR LUMBAR 03/13/2016 Luanne Bras, MD [facility]  . IR GENERIC HISTORICAL  03/13/2016   IR KYPHO EA ADDL LEVEL THORACIC OR LUMBAR 03/13/2016 Luanne Bras, MD [facility]  . IR GENERIC HISTORICAL  03/13/2016   IR BONE TUMOR(S)RF ABLATION 03/13/2016 Luanne Bras, MD [facility]  . IR GENERIC HISTORICAL  03/13/2016   IR KYPHO LUMBAR INC FX REDUCE BONE BX UNI/BIL CANNULATION INC/IMAGING 03/13/2016 Luanne Bras, MD [facility]  . IR GENERIC HISTORICAL  03/13/2016   IR BONE TUMOR(S)RF ABLATION 03/13/2016 Luanne Bras, MD [facility]  . IR GENERIC HISTORICAL  03/13/2016   IR BONE TUMOR(S)RF ABLATION 03/13/2016 Luanne Bras, MD [facility]  . IR GENERIC HISTORICAL  03/31/2016   IR RADIOLOGIST EVAL & MGMT 03/31/2016 [facility]  . LAPAROSCOPIC  INGUINAL HERNIA REPAIR Bilateral 12-16-2013  dr gross  . RADIOLOGY WITH ANESTHESIA N/A 02/15/2016   Procedure: Spinal Ablation;  Surgeon: Luanne Bras, MD;  Location: [place] [facility];  Service: Radiology;  Laterality: N/A;  . RADIOLOGY WITH ANESTHESIA N/A 03/13/2016   Procedure: LUMBER ABLATION;  Surgeon: Luanne Bras, MD;  Location: [place];  Service: Radiology;  Laterality: N/A;  . ROTATOR CUFF REPAIR Right 2003  . TONSILLECTOMY  age 89  . WISDOM TOOTH EXTRACTION      Family History  Problem Relation Age of Onset  . Uterine cancer Mother   . Heart disease Father   . Hypertension Father   . Multiple sclerosis Sister   . Paranoid behavior Brother   . Drug abuse Brother   . Schizophrenia Brother   . Stroke Maternal Grandfather   . Cancer Maternal Aunt   . Leukemia Paternal Aunt   . Healthy Son        x1  . Healthy Daughter  x2  . Allergies Daughter        x1  . Diabetes Neg Hx   . Alzheimer's disease Neg Hx   . Parkinson's disease Neg Hx     No Known Allergies  Current Outpatient Medications on File Prior to Visit  Medication Sig Dispense Refill  . Cholecalciferol (VITAMIN D-3) 5000 units TABS Take 5,000 mg by mouth daily.    . famciclovir (FAMVIR) 500 MG tablet Take 1 tablet (500 mg total) by mouth daily. 90 tablet 3  . fentaNYL (DURAGESIC - DOSED MCG/HR) 25 MCG/HR patch Place 1 patch (25 mcg total) onto the skin every other day. 15 patch 0  . lenalidomide (REVLIMID) 10 MG capsule TAKE 1 CAPSULE BY MOUTH EVERY DAY FOR 21 DAYS, THEN 7 DAYS OFF. [id number] 21 capsule 0  . lidocaine (LIDODERM) 5 % PLAEC 1 PATCH ONTO THE SKIN DAILY.APPLY 1 PATCH TO THE MOST PAINFUL AREA FOR 12 HR IN A 24 HR PERIOD  2  . magnesium oxide (MAG-OX) 400 MG tablet Take 400 mg by mouth daily.    [name] MITIGARE 0.6 MG CAPS TAKE 1 CAPSULE (0.6 MG TOTAL) BY MOUTH DAILY. FIRST DAY TAKW 1 CAPSULE EVERY 8 HOURS.  0  . ondansetron (ZOFRAN-ODT) 8 MG disintegrating tablet Take 1 tablet (8 mg total) by mouth  every 8 (eight) hours as needed. 60 tablet 1  . Oxycodone HCl 10 MG TABS Take 1 tablet (10 mg total) by mouth every 4 (four) hours as needed. (Patient taking differently: Take 10 mg by mouth every 4 (four) hours as needed (pain). ) 30 tablet 0  . polyethylene glycol powder (MIRALAX) powder Take 17 g by mouth daily. 850 g 3  . Probiotic Product (PROBIOTIC PO) Take 1 capsule by mouth daily.    [name] rOPINIRole (REQUIP) 0.25 MG tablet TAKE 1 TABLET BY MOUTH 1 TO 3 HOURS BEFORE BED FOR 2 DAYS THEN INCREASE TO 2 TABS 60 tablet 2  . senna (SENOKOT) 8.6 MG TABS tablet Take 2 tablets (17.2 mg total) by mouth daily. (Patient taking differently: Take 1 tablet by mouth daily. ) 60 each 2  . sertraline (ZOLOFT) 100 MG tablet Take 100 mg by mouth at bedtime.   2  . XARELTO 20 MG TABS tablet TAKE 1 TABLET BY MOUTH DAILY WITH SUPPER 30 tablet 4  . zolpidem (AMBIEN) 10 MG tablet Take 1 tablet (10 mg total) by mouth at bedtime as needed. 30 tablet 3   No current facility-administered medications on file prior to visit.     BP 102/70 (BP Location: Right Arm, Patient Position: Sitting, Cuff Size: Small)   Pulse 64   Temp 98 F (36.7 C) (Oral)   Resp 16   Ht _0  (1.702 m)   Wt 168 lb (76.2 kg)   SpO2 100%   BMI 26.31 kg/m       Objective:   Physical Exam  General Mental Status- Alert. General Appearance- Not in acute distress.   Skin General: Color- Normal Color. Moisture- Normal Moisture.  Neck Carotid Arteries- Normal color. Moisture- Normal Moisture. No carotid bruits. No JVD.  Chest and Lung Exam Auscultation: Breath Sounds:-Normal.  Cardiovascular Auscultation:Rythm- Regular. Murmurs & Other Heart Sounds:Auscultation of the heart reveals- No Murmurs.  Abdomen Inspection:-Inspeection Normal. Palpation/Percussion:Note:No mass. Palpation and Percussion of the abdomen reveal- Non Tender, Non Distended + BS, no rebound or guarding.    Neurologic Cranial Nerve exam:- CN III-XII  intact(No nystagmus), symmetric smile. Strength:- 5/5 equal and symmetric strength both  upper and lower extremities.      Assessment & Plan:  For insomnia, you do have refills of your ambien. Looks your are good for  3 refill past 07-31-2016. I will fill ambien when [name] rx expires.  For your mood continue sertraline and follow up with [facility].  Continue to follow up with Dr. Marin Olp as regularly.  Follow up 3-6 months or as needed  Yetta Marceaux, Percell Miller, [employer]

## 2017-07-16 NOTE — Patient Instructions (Addendum)
For insomnia, you do have refills of your ambien. Looks like you are  good for 3 refill past 07-31-2016. I will fill ambien  when [name] rx expires.  For your mood continue sertraline and follow up with [facility].  Continue to follow up with Dr. Marin Olp as regularly.  Follow up 3-6 months or as needed

## 2017-07-17 ENCOUNTER — Other Ambulatory Visit: Payer: 59

## 2017-07-24 ENCOUNTER — Other Ambulatory Visit: Payer: 59

## 2017-07-28 ENCOUNTER — Inpatient Hospital Stay: Payer: 59 | Attending: Hematology & Oncology

## 2017-07-28 DIAGNOSIS — D500 Iron deficiency anemia secondary to blood loss (chronic): Secondary | ICD-10-CM

## 2017-07-28 DIAGNOSIS — C9000 Multiple myeloma not having achieved remission: Secondary | ICD-10-CM | POA: Diagnosis not present

## 2017-07-28 LAB — CMP (CANCER CENTER ONLY)
ALBUMIN: 4.2 g/dL (ref 3.5–5.0)
ALK PHOS: 58 U/L (ref 40–150)
ALT: 42 U/L (ref 0–55)
AST: 40 U/L — ABNORMAL HIGH (ref 5–34)
Anion gap: 10 (ref 3–11)
BUN: 13 mg/dL (ref 7–26)
CALCIUM: 8.9 mg/dL (ref 8.4–10.4)
CO2: 23 mmol/L (ref 22–29)
Chloride: 106 mmol/L (ref 98–109)
Creatinine: 1.02 mg/dL (ref 0.70–1.30)
GFR, Estimated: >60 mL/min (ref >60)
GLUCOSE: 104 mg/dL (ref 70–140)
POTASSIUM: 3.8 mmol/L (ref 3.5–5.1)
SODIUM: 139 mmol/L (ref 136–145)
Total Bilirubin: 0.5 mg/dL (ref 0.2–1.2)
Total Protein: 6.8 g/dL (ref 6.4–8.3)

## 2017-07-28 LAB — CBC WITH DIFFERENTIAL (CANCER CENTER ONLY)
BASOS PCT: 1 %
Basophils Absolute: 0 10*3/uL (ref 0.0–0.1)
EOS ABS: 0.2 10*3/uL (ref 0.0–0.5)
EOS PCT: 5 %
HCT: 40.4 % (ref 38.7–49.9)
HEMOGLOBIN: 13.3 g/dL (ref 13.0–17.1)
LYMPHS ABS: 1 10*3/uL (ref 0.9–3.3)
Lymphocytes Relative: 28 %
MCH: 32.4 pg (ref 28.0–33.4)
MCHC: 32.9 g/dL (ref 32.0–35.9)
MCV: 98.3 fL — ABNORMAL HIGH (ref 82.0–98.0)
MONO ABS: 0.5 10*3/uL (ref 0.1–0.9)
MONOS PCT: 16 %
NEUTROS PCT: 50 %
Neutro Abs: 1.7 10*3/uL (ref 1.5–6.5)
PLATELETS: 136 10*3/uL — AB (ref 140–400)
RBC: 4.11 MIL/uL — ABNORMAL LOW (ref 4.20–5.70)
RDW: 17.8 % — AB (ref 11.1–15.7)
WBC Count: 3.4 10*3/uL — ABNORMAL LOW (ref 4.0–10.3)

## 2017-07-29 LAB — IGG, IGA, IGM
IgA: 161 mg/dL (ref 61–437)
IgG (Immunoglobin G), Serum: 683 mg/dL — ABNORMAL LOW (ref 700–1600)
IgM (Immunoglobulin M), Srm: 30 mg/dL (ref 20–172)

## 2017-07-29 LAB — KAPPA/LAMBDA LIGHT CHAINS
KAPPA FREE LGHT CHN: 20.4 mg/L — AB (ref 3.3–19.4)
Kappa, lambda light chain ratio: 1.48 (ref 0.26–1.65)
Lambda free light chains: 13.8 mg/L (ref 5.7–26.3)

## 2017-07-31 ENCOUNTER — Other Ambulatory Visit: Payer: 59

## 2017-08-04 LAB — PROTEIN ELECTROPHORESIS, SERUM, WITH REFLEX
A/G RATIO SPE: 1.9 — AB (ref 0.7–1.7)
ALBUMIN ELP: 4 g/dL (ref 2.9–4.4)
Alpha-1-Globulin: 0.2 g/dL (ref 0.0–0.4)
Alpha-2-Globulin: 0.5 g/dL (ref 0.4–1.0)
Beta Globulin: 0.8 g/dL (ref 0.7–1.3)
Gamma Globulin: 0.6 g/dL (ref 0.4–1.8)
Globulin, Total: 2.1 g/dL — ABNORMAL LOW (ref 2.2–3.9)
M-Spike, %: 0.1 g/dL — ABNORMAL HIGH
SPEP Interpretation: 0
Total Protein ELP: 6.1 g/dL (ref 6.0–8.5)

## 2017-08-07 ENCOUNTER — Other Ambulatory Visit: Payer: 59

## 2017-08-12 ENCOUNTER — Other Ambulatory Visit: Payer: Self-pay | Admitting: *Deleted

## 2017-08-12 DIAGNOSIS — D472 Monoclonal gammopathy: Secondary | ICD-10-CM

## 2017-08-12 DIAGNOSIS — I8290 Acute embolism and thrombosis of unspecified vein: Secondary | ICD-10-CM

## 2017-08-12 DIAGNOSIS — M545 Low back pain, unspecified: Secondary | ICD-10-CM

## 2017-08-12 DIAGNOSIS — M7989 Other specified soft tissue disorders: Secondary | ICD-10-CM

## 2017-08-12 DIAGNOSIS — G8929 Other chronic pain: Secondary | ICD-10-CM

## 2017-08-12 DIAGNOSIS — C9000 Multiple myeloma not having achieved remission: Secondary | ICD-10-CM

## 2017-08-12 MED ORDER — LENALIDOMIDE 10 MG PO CAPS: ORAL_CAPSULE | ORAL | 0 refills | Status: DC

## 2017-08-14 ENCOUNTER — Other Ambulatory Visit: Payer: 59

## 2017-08-18 ENCOUNTER — Inpatient Hospital Stay: Payer: 59

## 2017-08-18 ENCOUNTER — Inpatient Hospital Stay: Payer: 59 | Attending: Hematology & Oncology | Admitting: Hematology & Oncology

## 2017-08-18 ENCOUNTER — Other Ambulatory Visit: Payer: Self-pay

## 2017-08-18 ENCOUNTER — Encounter: Payer: Self-pay | Admitting: Hematology & Oncology

## 2017-08-18 VITALS — BP 122/73 | HR 62 | Temp 97.7°F | Resp 18 | Wt 169.0 lb

## 2017-08-18 VITALS — BP 117/74 | HR 59

## 2017-08-18 DIAGNOSIS — M25511 Pain in right shoulder: Secondary | ICD-10-CM | POA: Diagnosis not present

## 2017-08-18 DIAGNOSIS — D472 Monoclonal gammopathy: Secondary | ICD-10-CM

## 2017-08-18 DIAGNOSIS — Z9484 Stem cells transplant status: Secondary | ICD-10-CM | POA: Insufficient documentation

## 2017-08-18 DIAGNOSIS — M545 Low back pain, unspecified: Secondary | ICD-10-CM

## 2017-08-18 DIAGNOSIS — C9000 Multiple myeloma not having achieved remission: Secondary | ICD-10-CM

## 2017-08-18 DIAGNOSIS — G8929 Other chronic pain: Secondary | ICD-10-CM

## 2017-08-18 DIAGNOSIS — M549 Dorsalgia, unspecified: Secondary | ICD-10-CM | POA: Diagnosis not present

## 2017-08-18 DIAGNOSIS — Z86718 Personal history of other venous thrombosis and embolism: Secondary | ICD-10-CM | POA: Diagnosis not present

## 2017-08-18 DIAGNOSIS — M7989 Other specified soft tissue disorders: Secondary | ICD-10-CM

## 2017-08-18 DIAGNOSIS — Z7901 Long term (current) use of anticoagulants: Secondary | ICD-10-CM | POA: Insufficient documentation

## 2017-08-18 DIAGNOSIS — D500 Iron deficiency anemia secondary to blood loss (chronic): Secondary | ICD-10-CM

## 2017-08-18 DIAGNOSIS — Z9221 Personal history of antineoplastic chemotherapy: Secondary | ICD-10-CM | POA: Diagnosis not present

## 2017-08-18 DIAGNOSIS — Z79899 Other long term (current) drug therapy: Secondary | ICD-10-CM | POA: Diagnosis not present

## 2017-08-18 DIAGNOSIS — I8290 Acute embolism and thrombosis of unspecified vein: Secondary | ICD-10-CM

## 2017-08-18 LAB — FERRITIN: Ferritin: 37 ng/mL (ref 22–316)

## 2017-08-18 LAB — CBC WITH DIFFERENTIAL (CANCER CENTER ONLY)
BASOS ABS: 0 10*3/uL (ref 0.0–0.1)
BASOS PCT: 1 %
Eosinophils Absolute: 0.1 10*3/uL (ref 0.0–0.5)
Eosinophils Relative: 4 %
HEMATOCRIT: 38.6 % — AB (ref 38.7–49.9)
Hemoglobin: 13.2 g/dL (ref 13.0–17.1)
LYMPHS PCT: 31 %
Lymphs Abs: 1 10*3/uL (ref 0.9–3.3)
MCH: 34.2 pg — ABNORMAL HIGH (ref 28.0–33.4)
MCHC: 34.2 g/dL (ref 32.0–35.9)
MCV: 100 fL — AB (ref 82.0–98.0)
MONO ABS: 0.6 10*3/uL (ref 0.1–0.9)
Monocytes Relative: 17 %
NEUTROS ABS: 1.5 10*3/uL (ref 1.5–6.5)
Neutrophils Relative %: 47 %
PLATELETS: 130 10*3/uL — AB (ref 145–400)
RBC: 3.86 MIL/uL — AB (ref 4.20–5.70)
RDW: 16.5 % — AB (ref 11.1–15.7)
WBC: 3.3 10*3/uL — AB (ref 4.0–10.0)

## 2017-08-18 LAB — CMP (CANCER CENTER ONLY)
ALBUMIN: 3.7 g/dL (ref 3.5–5.0)
ALT: 44 U/L (ref 0–55)
ANION GAP: 4 — AB (ref 5–15)
AST: 44 U/L — AB (ref 5–34)
Alkaline Phosphatase: 57 U/L (ref 26–84)
BILIRUBIN TOTAL: 0.7 mg/dL (ref 0.2–1.2)
BUN: 11 mg/dL (ref 7–22)
CALCIUM: 9.5 mg/dL (ref 8.0–10.3)
CO2: 28 mmol/L (ref 18–33)
CREATININE: 1.1 mg/dL (ref 0.70–1.30)
Chloride: 111 mmol/L — ABNORMAL HIGH (ref 98–108)
GLUCOSE: 88 mg/dL (ref 73–118)
Potassium: 4 mmol/L (ref 3.3–4.7)
Sodium: 143 mmol/L (ref 128–145)
TOTAL PROTEIN: 6.4 g/dL (ref 6.4–8.1)

## 2017-08-18 LAB — IRON AND TIBC
Iron: 106 ug/dL (ref 42–163)
Saturation Ratios: 29 % — ABNORMAL LOW (ref 42–163)
TIBC: 366 ug/dL (ref 202–409)
UIBC: 260 ug/dL

## 2017-08-18 MED ORDER — KETOROLAC TROMETHAMINE 10 MG PO TABS: 10.0000 mg | ORAL_TABLET | Freq: Three times a day (TID) | ORAL | 0 refills | Status: AC

## 2017-08-18 MED ORDER — KETOROLAC TROMETHAMINE 10 MG PO TABS: 10.0000 mg | ORAL_TABLET | Freq: Four times a day (QID) | ORAL | 0 refills | Status: DC | PRN

## 2017-08-18 MED ORDER — ZOLEDRONIC ACID 4 MG/100ML IV SOLN
4.0000 mg | Freq: Once | INTRAVENOUS | Status: AC
  Administered 2017-08-18: 4 mg via INTRAVENOUS
  Filled 2017-08-18: qty 100

## 2017-08-18 NOTE — Patient Instructions (Signed)
Zoledronic Acid injection (Hypercalcemia, Oncology) (Zometa) What is this medicine? ZOLEDRONIC ACID (ZOE le dron ik AS id) lowers the amount of calcium loss from bone. It is used to treat too much calcium in your blood from cancer. It is also used to prevent complications of cancer that has spread to the bone. This medicine may be used for other purposes; ask your health care provider or pharmacist if you have questions. COMMON BRAND NAME(S): Zometa What should I tell my health care provider before I take this medicine? They need to know if you have any of these conditions: -aspirin-sensitive asthma -cancer, especially if you are receiving medicines used to treat cancer -dental disease or wear dentures -infection -kidney disease -receiving corticosteroids like dexamethasone or prednisone -an unusual or allergic reaction to zoledronic acid, other medicines, foods, dyes, or preservatives -pregnant or trying to get pregnant -breast-feeding How should I use this medicine? This medicine is for infusion into a vein. It is given by a health care professional in a hospital or clinic setting. Talk to your pediatrician regarding the use of this medicine in children. Special care may be needed. Overdosage: If you think you have taken too much of this medicine contact a poison control center or emergency room at once. NOTE: This medicine is only for you. Do not share this medicine with others. What if I miss a dose? It is important not to miss your dose. Call your doctor or health care professional if you are unable to keep an appointment. What may interact with this medicine? -certain antibiotics given by injection -NSAIDs, medicines for pain and inflammation, like ibuprofen or naproxen -some diuretics like bumetanide, furosemide -teriparatide -thalidomide This list may not describe all possible interactions. Give your health care provider a list of all the medicines, herbs, non-prescription drugs,  or dietary supplements you use. Also tell them if you smoke, drink alcohol, or use illegal drugs. Some items may interact with your medicine. What should I watch for while using this medicine? Visit your doctor or health care professional for regular checkups. It may be some time before you see the benefit from this medicine. Do not stop taking your medicine unless your doctor tells you to. Your doctor may order blood tests or other tests to see how you are doing. Women should inform their doctor if they wish to become pregnant or think they might be pregnant. There is a potential for serious side effects to an unborn child. Talk to your health care professional or pharmacist for more information. You should make sure that you get enough calcium and vitamin D while you are taking this medicine. Discuss the foods you eat and the vitamins you take with your health care professional. Some people who take this medicine have severe bone, joint, and/or muscle pain. This medicine may also increase your risk for jaw problems or a broken thigh bone. Tell your doctor right away if you have severe pain in your jaw, bones, joints, or muscles. Tell your doctor if you have any pain that does not go away or that gets worse. Tell your dentist and dental surgeon that you are taking this medicine. You should not have major dental surgery while on this medicine. See your dentist to have a dental exam and fix any dental problems before starting this medicine. Take good care of your teeth while on this medicine. Make sure you see your dentist for regular follow-up appointments. What side effects may I notice from receiving this medicine? Side effects   that you should report to your doctor or health care professional as soon as possible: -allergic reactions like skin rash, itching or hives, swelling of the face, lips, or tongue -anxiety, confusion, or depression -breathing problems -changes in vision -eye pain -feeling faint  or lightheaded, falls -jaw pain, especially after dental work -mouth sores -muscle cramps, stiffness, or weakness -redness, blistering, peeling or loosening of the skin, including inside the mouth -trouble passing urine or change in the amount of urine Side effects that usually do not require medical attention (report to your doctor or health care professional if they continue or are bothersome): -bone, joint, or muscle pain -constipation -diarrhea -fever -hair loss -irritation at site where injected -loss of appetite -nausea, vomiting -stomach upset -trouble sleeping -trouble swallowing -weak or tired This list may not describe all possible side effects. Call your doctor for medical advice about side effects. You may report side effects to FDA at 1-800-FDA-1088. Where should I keep my medicine? This drug is given in a hospital or clinic and will not be stored at home. NOTE: This sheet is a summary. It may not cover all possible information. If you have questions about this medicine, talk to your doctor, pharmacist, or health care provider.  2018 Elsevier/Gold Standard (2013-11-26 14:19:39)  

## 2017-08-18 NOTE — Progress Notes (Signed)
Hematology and Oncology Follow Up Visit  Jerrian Mells [id number] [birth year] 62 y.o. 08/18/2017   Principle Diagnosis:  IgG Kappa myeloma - Hyperdiploid/+11 DVT of the LEFT and RIGHT leg   Current Therapy:   Revlimid 10 mg po q day  Zometa 4 mg IV q 6 weeks- next dose is 09/2017 Xarelto 10 mg PO daily   Interim History:  Mr. Lofaso is here today for follow-up.  He will see the transplant doctors at [facility] tomorrow.  He feels pretty good.  His back still bothers him.  He is complaining of right shoulder issues.  He sounds like he may have a rotator cuff issue.  I told him to see his orthopedist for this.  I will give him a prescription for Toradol to see if this can help a little bit.  His myeloma levels have done very well.  Back in January, his M spike was 0.1 g/dL.  His IgG level was 683 mg/dL.  His Kappa Lightchain was 2 mg/dL.  He has had no fever.  He actually went to a [place] concert last night.  He showed me videos.  I am very impressed.  He has had no bleeding.  He has had no issues with gout.  He has had no leg swelling.  He has had some loose stools but no frank diarrhea.    Overall, his performance status is ECOG 1.  Medications:  Allergies as of 08/18/2017   No Known Allergies     Medication List        Accurate as of 08/18/17  1:39 PM. Always use your most recent med list.          famciclovir 500 MG tablet Commonly known as:  FAMVIR Take 1 tablet (500 mg total) by mouth daily.   fentaNYL 25 MCG/HR patch Commonly known as:  DURAGESIC - dosed mcg/hr Place 1 patch (25 mcg total) onto the skin every other day.   lenalidomide 10 MG capsule Commonly known as:  REVLIMID TAKE 1 CAPSULE BY MOUTH EVERY DAY FOR 21 DAYS, THEN 7 DAYS OFF. [facility]#[id number]   lidocaine 5 % Commonly known as:  LIDODERM PLAEC 1 PATCH ONTO THE SKIN DAILY.APPLY 1 PATCH TO THE MOST PAINFUL AREA FOR 12 HR IN A 24 HR PERIOD   magnesium oxide 400 MG tablet Commonly known as:   MAG-OX Take 400 mg by mouth daily.   ondansetron 8 MG disintegrating tablet Commonly known as:  ZOFRAN-ODT Take 1 tablet (8 mg total) by mouth every 8 (eight) hours as needed.   Oxycodone HCl 10 MG Tabs Take 1 tablet (10 mg total) by mouth every 4 (four) hours as needed.   polyethylene glycol powder powder Commonly known as:  MIRALAX Take 17 g by mouth daily.   PROBIOTIC PO Take 1 capsule by mouth daily.   rOPINIRole 0.25 MG tablet Commonly known as:  REQUIP TAKE 1 TABLET BY MOUTH 1 TO 3 HOURS BEFORE BED FOR 2 DAYS THEN INCREASE TO 2 TABS   senna 8.6 MG Tabs tablet Commonly known as:  SENOKOT Take 2 tablets (17.2 mg total) by mouth daily.   sertraline 100 MG tablet Commonly known as:  ZOLOFT Take 100 mg by mouth at bedtime.   Vitamin D-3 5000 units Tabs Take 5,000 mg by mouth daily.   XARELTO 10 MG Tabs tablet Generic drug:  rivaroxaban Take 10 mg by mouth daily.   zolpidem 10 MG tablet Commonly known as:  AMBIEN Take 1 tablet (10 mg total) by  mouth at bedtime as needed.       Allergies: No Known Allergies  Past Medical History, Surgical history, Social history, and Family History were reviewed and updated.  Review of Systems: Review of Systems  Musculoskeletal: Positive for back pain.  All other systems reviewed and are negative.    Physical Exam:  weight is 169 lb (76.7 kg). His oral temperature is 97.7 F (36.5 C). His blood pressure is 122/73 and his pulse is 62. His respiration is 18 and oxygen saturation is 99%.   Wt Readings from Last 3 Encounters:  08/18/17 169 lb (76.7 kg)  07/16/17 168 lb (76.2 kg)  06/30/17 167 lb 12.8 oz (76.1 kg)    Physical Exam  Constitutional: He is oriented to person, place, and time.  HENT:  Head: Normocephalic and atraumatic.  Mouth/Throat: Oropharynx is clear and moist.  Eyes: EOM are normal. Pupils are equal, round, and reactive to light.  Neck: Normal range of motion.  Cardiovascular: Normal rate, regular  rhythm and normal heart sounds.  Pulmonary/Chest: Effort normal and breath sounds normal.  Abdominal: Soft. Bowel sounds are normal.  Musculoskeletal: Normal range of motion. He exhibits no edema, tenderness or deformity.  Lymphadenopathy:    He has no cervical adenopathy.  Neurological: He is alert and oriented to person, place, and time.  Skin: Skin is warm and dry. No rash noted. No erythema.  Psychiatric: He has a normal mood and affect. His behavior is normal. Judgment and thought content normal.  Vitals reviewed.    Lab Results  Component Value Date   WBC 3.3 (L) 08/18/2017   HGB 12.3 (L) 06/30/2017   HCT 38.6 (L) 08/18/2017   MCV 100.0 (H) 08/18/2017   PLT 130 (L) 08/18/2017   Lab Results  Component Value Date   FERRITIN 13 (L) 06/30/2017   IRON 66 06/30/2017   TIBC 428 (H) 06/30/2017   UIBC 362 06/30/2017   IRONPCTSAT 15 (L) 06/30/2017   Lab Results  Component Value Date   RETICCTPCT 1.1 03/05/2017   RBC 3.86 (L) 08/18/2017   Lab Results  Component Value Date   KPAFRELGTCHN 20.4 (H) 07/28/2017   LAMBDASER 13.8 07/28/2017   KAPLAMBRATIO 1.48 07/28/2017   Lab Results  Component Value Date   IGGSERUM 683 (L) 07/28/2017   IGA 161 07/28/2017   IGMSERUM 30 07/28/2017   Lab Results  Component Value Date   TOTALPROTELP 6.1 07/28/2017   ALBUMINELP 4.0 07/28/2017   A1GS 0.2 07/28/2017   A2GS 0.5 07/28/2017   BETS 0.8 07/28/2017   GAMS 0.6 07/28/2017   MSPIKE 0.1 (H) 07/28/2017     Chemistry      Component Value Date/Time   NA 143 08/18/2017 1126   NA 143 06/30/2017 1049   NA 140 10/03/2016 0845   K 4.0 08/18/2017 1126   K 4.0 06/30/2017 1049   K 4.3 10/03/2016 0845   CL 111 (H) 08/18/2017 1126   CL 105 06/30/2017 1049   CO2 28 08/18/2017 1126   CO2 27 06/30/2017 1049   CO2 26 10/03/2016 0845   BUN 11 08/18/2017 1126   BUN 13 06/30/2017 1049   BUN 11.9 10/03/2016 0845   CREATININE 1.0 06/30/2017 1049   CREATININE 0.9 10/03/2016 0845       Component Value Date/Time   CALCIUM 9.5 08/18/2017 1126   CALCIUM 9.0 06/30/2017 1049   CALCIUM 9.9 10/03/2016 0845   ALKPHOS 57 08/18/2017 1126   ALKPHOS 57 06/30/2017 1049   ALKPHOS 80 10/03/2016  0845   AST 44 (H) 08/18/2017 1126   AST 28 10/03/2016 0845   ALT 44 08/18/2017 1126   ALT 43 06/30/2017 1049   ALT 26 10/03/2016 0845   BILITOT 0.7 08/18/2017 1126   BILITOT 0.48 10/03/2016 0845      Impression and Plan: Mr. Vandrunen is a pleasant 62 yo caucasian gentleman with IgG kappa myeloma. He underwent chemotherapy with RVD, followed by autologous stem cell transplant in February 2018.   He will get his Zometa today.  We will plan to get him back in 6 more weeks.  I want to make sure that we follow-up with his Zometa.  I think this is helpful for him.  [name], MD 2/5/20191:39 PM is

## 2017-08-19 DIAGNOSIS — D649 Anemia, unspecified: Secondary | ICD-10-CM | POA: Diagnosis not present

## 2017-08-19 DIAGNOSIS — C9000 Multiple myeloma not having achieved remission: Secondary | ICD-10-CM | POA: Diagnosis not present

## 2017-08-19 DIAGNOSIS — Z029 Encounter for administrative examinations, unspecified: Secondary | ICD-10-CM | POA: Diagnosis not present

## 2017-08-19 DIAGNOSIS — E559 Vitamin D deficiency, unspecified: Secondary | ICD-10-CM | POA: Diagnosis not present

## 2017-08-19 DIAGNOSIS — Z9481 Bone marrow transplant status: Secondary | ICD-10-CM | POA: Diagnosis not present

## 2017-08-19 LAB — IGG, IGA, IGM
IGA: 151 mg/dL (ref 61–437)
IGM (IMMUNOGLOBULIN M), SRM: 24 mg/dL (ref 20–172)
IgG (Immunoglobin G), Serum: 687 mg/dL — ABNORMAL LOW (ref 700–1600)

## 2017-08-19 LAB — KAPPA/LAMBDA LIGHT CHAINS
KAPPA FREE LGHT CHN: 21 mg/L — AB (ref 3.3–19.4)
Kappa, lambda light chain ratio: 2.08 — ABNORMAL HIGH (ref 0.26–1.65)
Lambda free light chains: 10.1 mg/L (ref 5.7–26.3)

## 2017-08-20 ENCOUNTER — Other Ambulatory Visit: Payer: Self-pay | Admitting: Hematology & Oncology

## 2017-08-20 DIAGNOSIS — M545 Low back pain, unspecified: Secondary | ICD-10-CM

## 2017-08-20 DIAGNOSIS — G8929 Other chronic pain: Secondary | ICD-10-CM

## 2017-08-20 DIAGNOSIS — I8290 Acute embolism and thrombosis of unspecified vein: Secondary | ICD-10-CM

## 2017-08-20 DIAGNOSIS — M7989 Other specified soft tissue disorders: Secondary | ICD-10-CM

## 2017-08-20 DIAGNOSIS — D472 Monoclonal gammopathy: Secondary | ICD-10-CM

## 2017-08-20 DIAGNOSIS — C9000 Multiple myeloma not having achieved remission: Secondary | ICD-10-CM

## 2017-08-21 ENCOUNTER — Other Ambulatory Visit: Payer: 59

## 2017-08-21 LAB — PROTEIN ELECTROPHORESIS, SERUM, WITH REFLEX
A/G Ratio: 1.6 (ref 0.7–1.7)
ALPHA-1-GLOBULIN: 0.2 g/dL (ref 0.0–0.4)
ALPHA-2-GLOBULIN: 0.6 g/dL (ref 0.4–1.0)
Albumin ELP: 3.8 g/dL (ref 2.9–4.4)
Beta Globulin: 0.9 g/dL (ref 0.7–1.3)
GAMMA GLOBULIN: 0.7 g/dL (ref 0.4–1.8)
GLOBULIN, TOTAL: 2.4 g/dL (ref 2.2–3.9)
M-SPIKE, %: 0.2 g/dL — AB
SPEP Interpretation: 0
Total Protein ELP: 6.2 g/dL (ref 6.0–8.5)

## 2017-08-23 ENCOUNTER — Other Ambulatory Visit: Payer: Self-pay | Admitting: Hematology & Oncology

## 2017-08-23 DIAGNOSIS — M545 Low back pain, unspecified: Secondary | ICD-10-CM

## 2017-08-23 DIAGNOSIS — M7989 Other specified soft tissue disorders: Secondary | ICD-10-CM

## 2017-08-23 DIAGNOSIS — C9000 Multiple myeloma not having achieved remission: Secondary | ICD-10-CM

## 2017-08-23 DIAGNOSIS — G8929 Other chronic pain: Secondary | ICD-10-CM

## 2017-08-23 DIAGNOSIS — D472 Monoclonal gammopathy: Secondary | ICD-10-CM

## 2017-08-23 DIAGNOSIS — I8290 Acute embolism and thrombosis of unspecified vein: Secondary | ICD-10-CM

## 2017-08-28 ENCOUNTER — Encounter: Payer: Self-pay | Admitting: Family

## 2017-08-28 ENCOUNTER — Other Ambulatory Visit: Payer: 59

## 2017-09-01 ENCOUNTER — Other Ambulatory Visit: Payer: Self-pay | Admitting: Family

## 2017-09-04 ENCOUNTER — Other Ambulatory Visit: Payer: 59

## 2017-09-09 ENCOUNTER — Telehealth: Payer: Self-pay | Admitting: *Deleted

## 2017-09-09 ENCOUNTER — Ambulatory Visit (HOSPITAL_BASED_OUTPATIENT_CLINIC_OR_DEPARTMENT_OTHER)
Admission: RE | Admit: 2017-09-09 | Discharge: 2017-09-09 | Disposition: A | Discharge status: Home / Self Care | Payer: 59 | Source: Ambulatory Visit | Attending: Family | Admitting: Family

## 2017-09-09 ENCOUNTER — Other Ambulatory Visit: Payer: Self-pay | Admitting: Family

## 2017-09-09 DIAGNOSIS — K449 Diaphragmatic hernia without obstruction or gangrene: Secondary | ICD-10-CM | POA: Insufficient documentation

## 2017-09-09 DIAGNOSIS — R0781 Pleurodynia: Secondary | ICD-10-CM | POA: Insufficient documentation

## 2017-09-09 DIAGNOSIS — R05 Cough: Secondary | ICD-10-CM | POA: Diagnosis present

## 2017-09-09 DIAGNOSIS — R059 Cough, unspecified: Secondary | ICD-10-CM

## 2017-09-09 MED ORDER — BENZONATATE 200 MG PO CAPS: 200.0000 mg | ORAL_CAPSULE | Freq: Three times a day (TID) | ORAL | 0 refills | Status: DC | PRN

## 2017-09-09 NOTE — Telephone Encounter (Signed)
Patient has continued cough. He states it is now causing back and rib pain. He'd like something prescription strength to control coughing.  Reviewed with [name]. He wants patient to have a CXR. Tessalon Pearls can be called in for cough.   Patient is aware of order and new medication. Pharmacy confirmed.

## 2017-09-11 ENCOUNTER — Other Ambulatory Visit: Payer: 59

## 2017-09-15 ENCOUNTER — Other Ambulatory Visit: Payer: Self-pay | Admitting: *Deleted

## 2017-09-15 DIAGNOSIS — D472 Monoclonal gammopathy: Secondary | ICD-10-CM

## 2017-09-15 DIAGNOSIS — M7989 Other specified soft tissue disorders: Secondary | ICD-10-CM

## 2017-09-15 DIAGNOSIS — I8290 Acute embolism and thrombosis of unspecified vein: Secondary | ICD-10-CM

## 2017-09-15 DIAGNOSIS — M545 Low back pain, unspecified: Secondary | ICD-10-CM

## 2017-09-15 DIAGNOSIS — C9000 Multiple myeloma not having achieved remission: Secondary | ICD-10-CM

## 2017-09-15 DIAGNOSIS — G8929 Other chronic pain: Secondary | ICD-10-CM

## 2017-09-15 MED ORDER — LENALIDOMIDE 10 MG PO CAPS: ORAL_CAPSULE | ORAL | 0 refills | Status: DC

## 2017-09-18 ENCOUNTER — Other Ambulatory Visit: Payer: 59

## 2017-09-18 IMAGING — DX DG BONE SURVEY MET
9 of 10 series · 9 of 10 positions shown · non-contrast
Comparison: None.

CLINICAL DATA: Multiple myeloma.

EXAM:
METASTATIC BONE SURVEY

[chest pa]
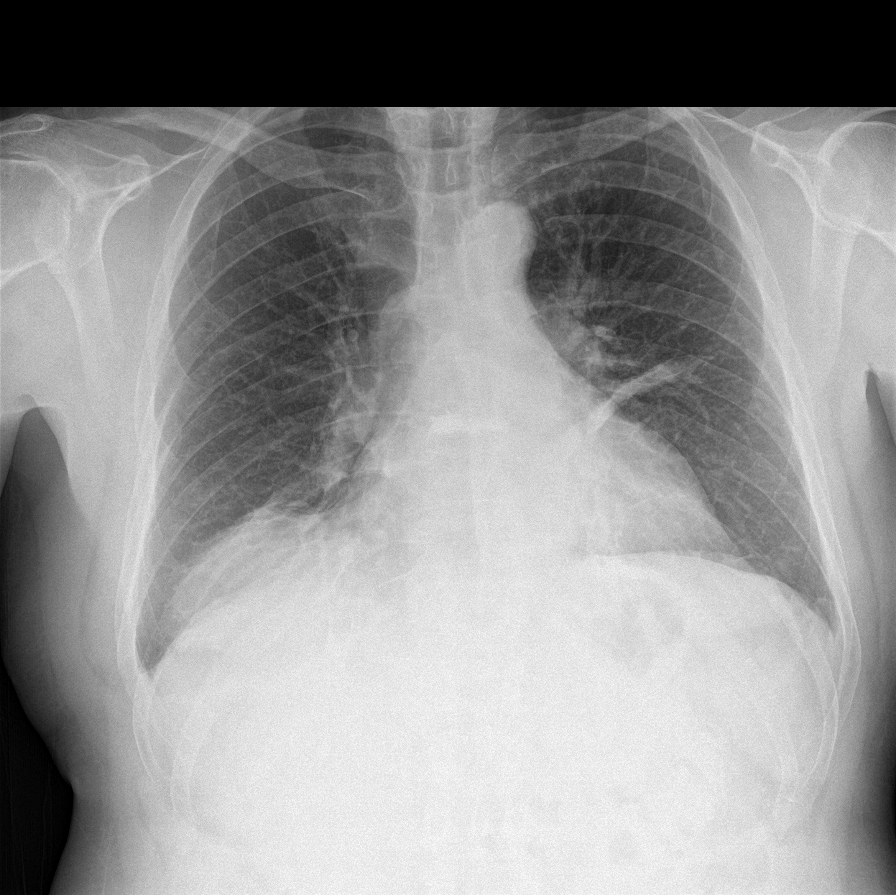

[c-spine lat]
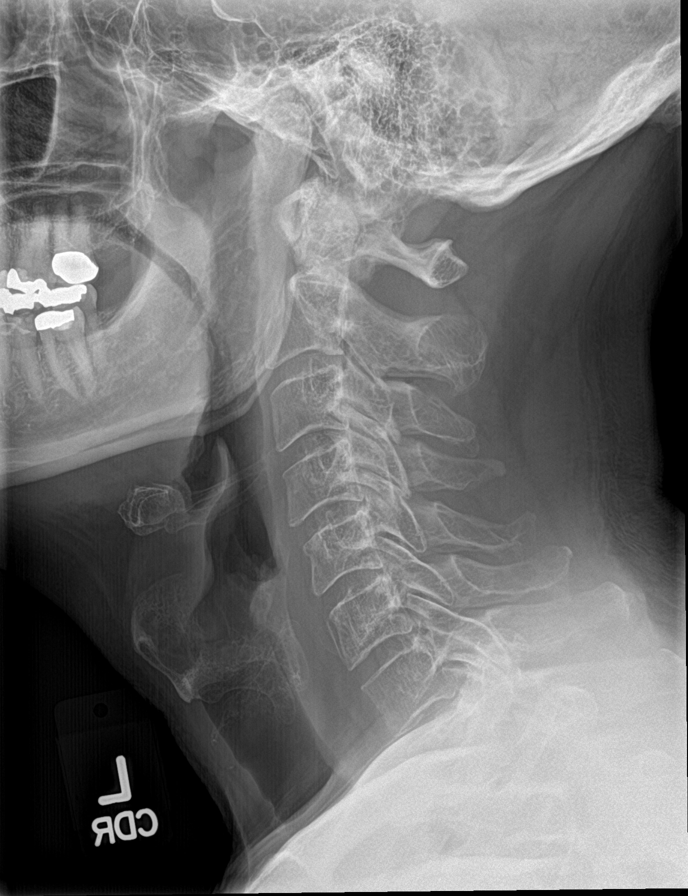

[skull lat]
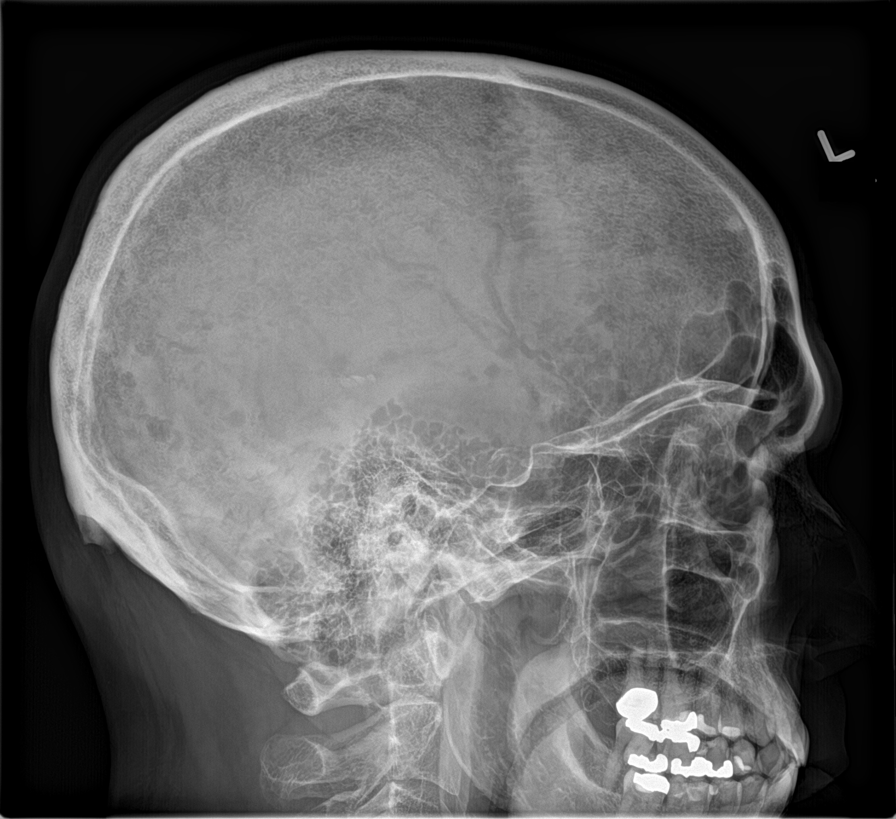

[c-spine ap]
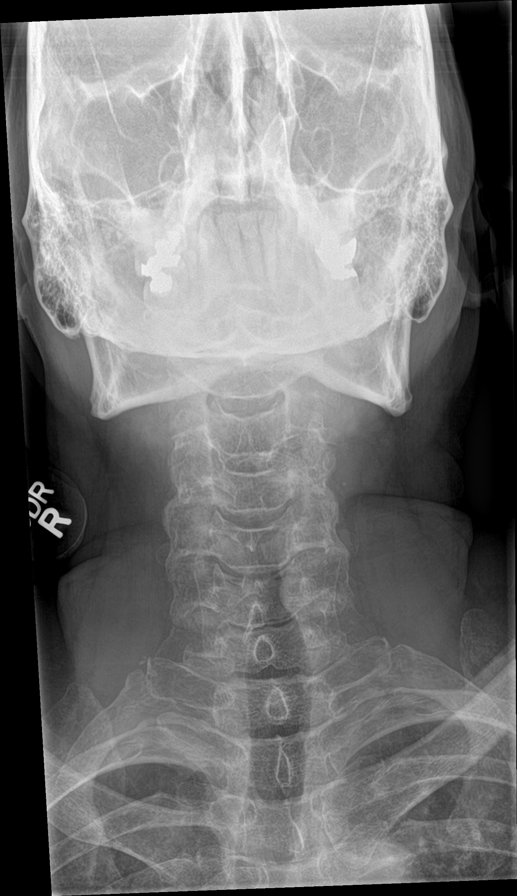

[t-spine ap]
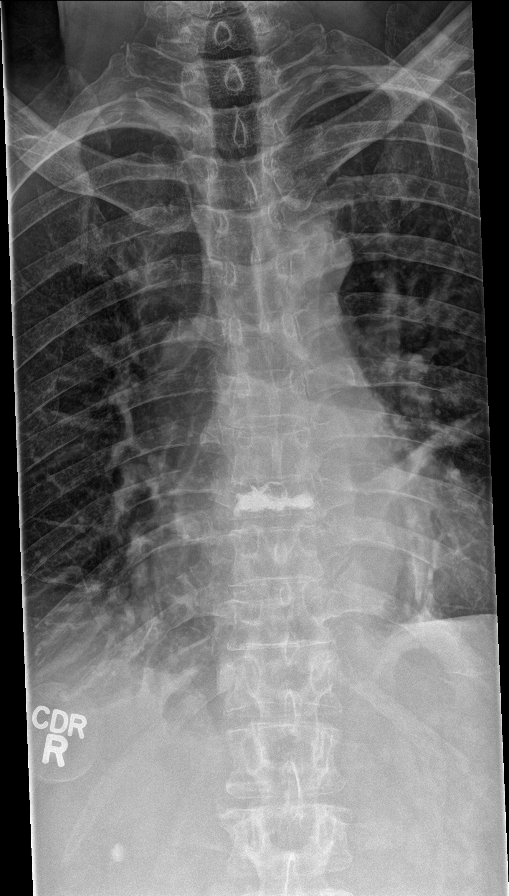

[t-spine lat]
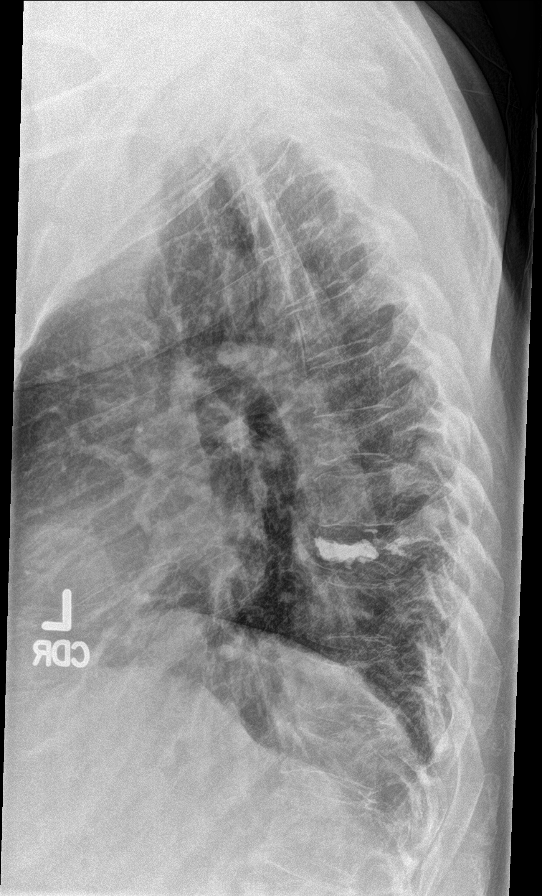

[shoulder ap (1 of 2)]
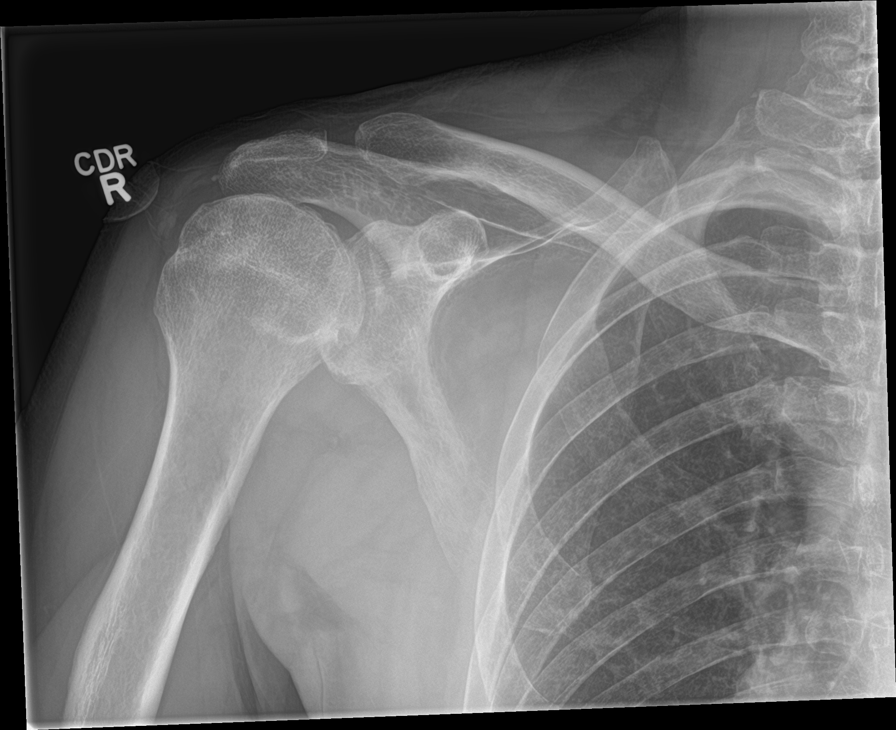

[shoulder ap (2 of 2)]
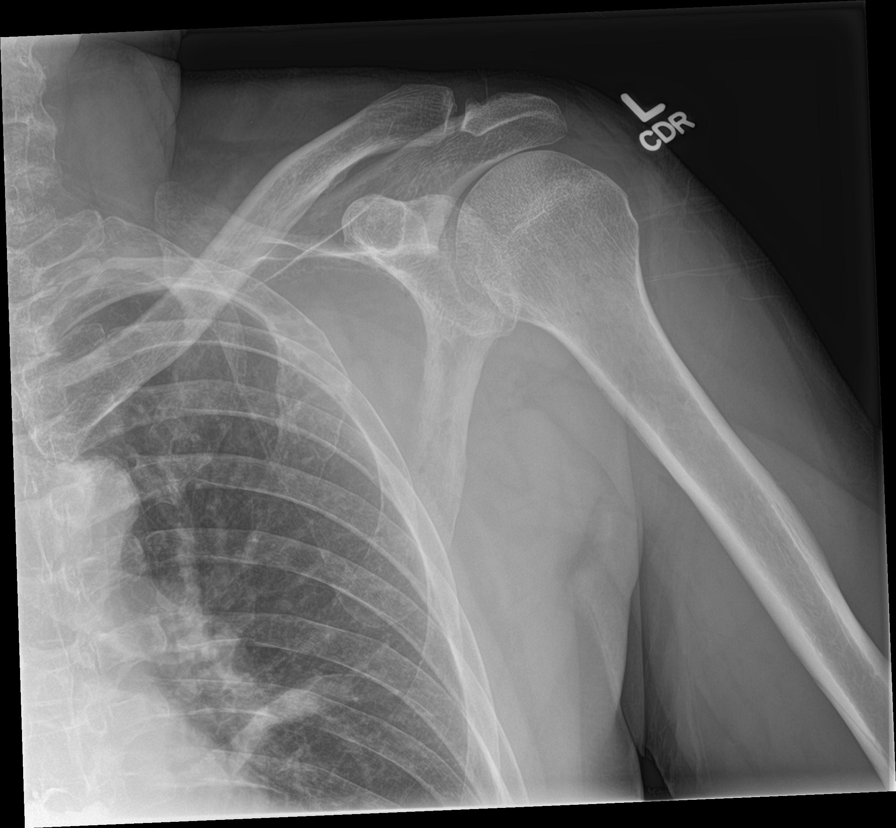

[humerus ap]
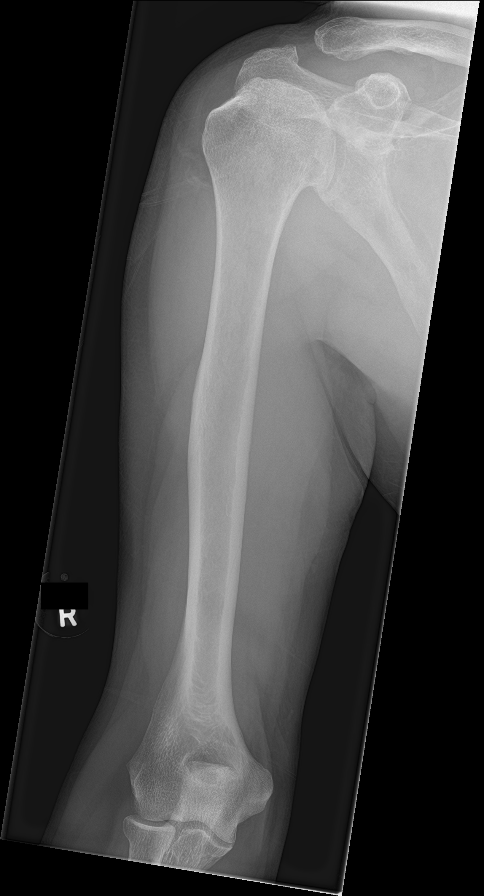

[9 of 10 positions shown; findings below may reference images not displayed]

FINDINGS: There numerous small lytic lesions throughout the calvarium
concerning for multiple myeloma.

Linear perihilar atelectasis in the left lung. Bibasilar
atelectasis. Heart is borderline in size. No effusions.

Changes of prior vertebroplasty at T9 with moderate compression
fracture. Mild compression fracture at T7 and likely at T12. Mild
compression fracture through the superior endplate of L2.

Small lucent lesions are suspected throughout the bony pelvis and in
the proximal femurs bilaterally.
IMPRESSION: Small lucent lesions throughout the calvarium.

Suspect small scattered lytic lesions throughout the bony pelvis and
in the proximal femurs.

Compression fractures at T9 (prior vertebroplasty), T12, and L2.

## 2017-09-21 ENCOUNTER — Other Ambulatory Visit: Payer: Self-pay | Admitting: Pharmacist

## 2017-09-21 DIAGNOSIS — C9000 Multiple myeloma not having achieved remission: Secondary | ICD-10-CM

## 2017-09-21 MED ORDER — DTAP-IPV-HIB VACCINE IM SUSR: 0.5000 mL | Freq: Once | INTRAMUSCULAR | Status: DC

## 2017-09-25 ENCOUNTER — Other Ambulatory Visit: Payer: 59

## 2017-09-28 ENCOUNTER — Other Ambulatory Visit: Payer: Self-pay | Admitting: *Deleted

## 2017-09-28 MED ORDER — AMOXICILLIN 500 MG PO TABS: ORAL_TABLET | ORAL | 0 refills | Status: DC

## 2017-09-30 ENCOUNTER — Inpatient Hospital Stay: Payer: 59 | Attending: Hematology & Oncology | Admitting: Hematology & Oncology

## 2017-09-30 ENCOUNTER — Inpatient Hospital Stay: Payer: 59

## 2017-09-30 ENCOUNTER — Encounter: Payer: Self-pay | Admitting: Hematology & Oncology

## 2017-09-30 ENCOUNTER — Other Ambulatory Visit: Payer: Self-pay

## 2017-09-30 VITALS — BP 112/74 | HR 80 | Temp 98.4°F | Resp 20 | Wt 171.0 lb

## 2017-09-30 DIAGNOSIS — C9000 Multiple myeloma not having achieved remission: Secondary | ICD-10-CM | POA: Diagnosis not present

## 2017-09-30 DIAGNOSIS — Z7901 Long term (current) use of anticoagulants: Secondary | ICD-10-CM | POA: Insufficient documentation

## 2017-09-30 DIAGNOSIS — Z79899 Other long term (current) drug therapy: Secondary | ICD-10-CM

## 2017-09-30 DIAGNOSIS — M549 Dorsalgia, unspecified: Secondary | ICD-10-CM | POA: Diagnosis not present

## 2017-09-30 DIAGNOSIS — Z86718 Personal history of other venous thrombosis and embolism: Secondary | ICD-10-CM | POA: Diagnosis not present

## 2017-09-30 LAB — CBC WITH DIFFERENTIAL (CANCER CENTER ONLY)
Basophils Absolute: 0 10*3/uL (ref 0.0–0.1)
Basophils Relative: 1 %
EOS ABS: 0.1 10*3/uL (ref 0.0–0.5)
EOS PCT: 4 %
HCT: 38.3 % — ABNORMAL LOW (ref 38.7–49.9)
Hemoglobin: 12.6 g/dL — ABNORMAL LOW (ref 13.0–17.1)
LYMPHS ABS: 1.1 10*3/uL (ref 0.9–3.3)
LYMPHS PCT: 33 %
MCH: 33.1 pg (ref 28.0–33.4)
MCHC: 32.9 g/dL (ref 32.0–35.9)
MCV: 100.5 fL — ABNORMAL HIGH (ref 82.0–98.0)
MONO ABS: 0.5 10*3/uL (ref 0.1–0.9)
MONOS PCT: 15 %
Neutro Abs: 1.5 10*3/uL (ref 1.5–6.5)
Neutrophils Relative %: 47 %
PLATELETS: 164 10*3/uL (ref 145–400)
RBC: 3.81 MIL/uL — AB (ref 4.20–5.70)
RDW: 14.4 % (ref 11.1–15.7)
WBC: 3.2 10*3/uL — AB (ref 4.0–10.0)

## 2017-09-30 LAB — CMP (CANCER CENTER ONLY)
ALT: 39 U/L (ref 10–47)
AST: 39 U/L — ABNORMAL HIGH (ref 11–38)
Albumin: 3.7 g/dL (ref 3.5–5.0)
Alkaline Phosphatase: 65 U/L (ref 26–84)
Anion gap: 5 (ref 5–15)
BUN: 12 mg/dL (ref 7–22)
CHLORIDE: 108 mmol/L (ref 98–108)
CO2: 27 mmol/L (ref 18–33)
Calcium: 9.4 mg/dL (ref 8.0–10.3)
Creatinine: 1.1 mg/dL (ref 0.60–1.20)
GLUCOSE: 121 mg/dL — AB (ref 73–118)
POTASSIUM: 3.8 mmol/L (ref 3.3–4.7)
Sodium: 140 mmol/L (ref 128–145)
TOTAL PROTEIN: 6.5 g/dL (ref 6.4–8.1)
Total Bilirubin: 0.6 mg/dL (ref 0.2–1.6)

## 2017-09-30 MED ORDER — SODIUM CHLORIDE 0.9% FLUSH
10.0000 mL | INTRAVENOUS | Status: DC | PRN
  Filled 2017-09-30: qty 10

## 2017-09-30 MED ORDER — ZOLEDRONIC ACID 4 MG/100ML IV SOLN
4.0000 mg | Freq: Once | INTRAVENOUS | Status: AC
  Administered 2017-09-30: 4 mg via INTRAVENOUS
  Filled 2017-09-30: qty 100

## 2017-09-30 MED ORDER — KETOROLAC TROMETHAMINE 10 MG PO TABS: 10.0000 mg | ORAL_TABLET | Freq: Four times a day (QID) | ORAL | 0 refills | Status: DC | PRN

## 2017-09-30 MED ORDER — SODIUM CHLORIDE 0.9% FLUSH
3.0000 mL | Freq: Once | INTRAVENOUS | Status: DC | PRN
  Filled 2017-09-30: qty 10

## 2017-09-30 NOTE — Patient Instructions (Signed)

## 2017-09-30 NOTE — Progress Notes (Signed)
Hematology and Oncology Follow Up Visit  Jorge [name] [id number] November 09, 1955 62 y.o. 09/30/2017   Principle Diagnosis:  IgG Kappa myeloma - Hyperdiploid/+11 DVT of the LEFT and RIGHT leg   Current Therapy:   Revlimid 10 mg po q day (21 on/7 off) Zometa 4 mg IV q 3 months- next dose is 12/2017 Xarelto 10 mg PO daily   Interim History:  Jorge [name] is here today for follow-up.  His back still causes him difficulties.  I am not sure there is much else we can do for his back.  He is already had radiation.  He has had kyphoplasty.  I just think that he has mechanical and anatomic issues that really cannot be fixed surgically.  He is doing well on the Revlimid.  He has had no problems with toxicity.  He has had no rashes.  Is had no pruritus.  There is been no change in bowel or bladder habits.  He has had no leg swelling.  We last saw him, his myeloma studies showed a M spike of 0.2 g/dL.  His IgG level was 687 mg/dL.  His kappa light chain was 2.1 mg/dL.  All of this is pretty stable.  He and his wife are headed down to [place].  They will be going down for a couple weeks.  They are looking forward to this.  He has had no mouth sores.  He has had no swallowing difficulties.  He has had no cough.  He is doing well on the Xarelto.  He is on low-dose Xarelto.  He has had no bleeding.    Overall, his performance status is ECOG 1.  Medications:  Allergies as of 09/30/2017   No Known Allergies     Medication List        Accurate as of 09/30/17  6:04 PM. Always use your most recent med list.          amoxicillin 500 MG tablet Commonly known as:  AMOXIL Take one hour before dental procedure   benzonatate 200 MG capsule Commonly known as:  TESSALON Take 1 capsule (200 mg total) by mouth 3 (three) times daily as needed for cough.   famciclovir 500 MG tablet Commonly known as:  FAMVIR Take 1 tablet (500 mg total) by mouth daily.   fentaNYL 25 MCG/HR patch Commonly known as:   DURAGESIC - dosed mcg/hr Place 1 patch (25 mcg total) onto the skin every other day.   ketorolac 10 MG tablet Commonly known as:  TORADOL Take 1 tablet (10 mg total) by mouth every 6 (six) hours as needed.   lenalidomide 10 MG capsule Commonly known as:  REVLIMID TAKE 1 CAPSULE BY MOUTH EVERY DAY FOR 21 DAYS, THEN 7 DAYS OFF [facility]#[id number]   lidocaine 5 % Commonly known as:  LIDODERM PLAEC 1 PATCH ONTO THE SKIN DAILY.APPLY 1 PATCH TO THE MOST PAINFUL AREA FOR 12 HR IN A 24 HR PERIOD   magnesium oxide 400 MG tablet Commonly known as:  MAG-OX Take 400 mg by mouth daily.   ondansetron 8 MG disintegrating tablet Commonly known as:  ZOFRAN-ODT Take 1 tablet (8 mg total) by mouth every 8 (eight) hours as needed.   OVER THE COUNTER MEDICATION 29 mg daily.   Oxycodone HCl 10 MG Tabs Take 1 tablet (10 mg total) by mouth every 4 (four) hours as needed.   polyethylene glycol powder powder Commonly known as:  MIRALAX Take 17 g by mouth daily.   PROBIOTIC PO Take 1  capsule by mouth daily.   rOPINIRole 0.25 MG tablet Commonly known as:  REQUIP TAKE 1 TABLET BY MOUTH 1 TO 3 HOURS BEFORE BED FOR 2 DAYS THEN INCREASE TO 2 TABS   senna 8.6 MG Tabs tablet Commonly known as:  SENOKOT Take 2 tablets (17.2 mg total) by mouth daily.   sertraline 100 MG tablet Commonly known as:  ZOLOFT Take 100 mg by mouth at bedtime.   Vitamin D-3 5000 units Tabs Take 5,000 mg by mouth daily.   XARELTO 10 MG Tabs tablet Generic drug:  rivaroxaban Take 10 mg by mouth daily.   zolpidem 10 MG tablet Commonly known as:  AMBIEN Take 1 tablet (10 mg total) by mouth at bedtime as needed.       Allergies: No Known Allergies  Past Medical History, Surgical history, Social history, and Family History were reviewed and updated.  Review of Systems: Review of Systems  Musculoskeletal: Positive for back pain.  All other systems reviewed and are negative.    Physical Exam:  weight is 171 lb (77.6  kg). His oral temperature is 98.4 F (36.9 C). His blood pressure is 112/74 and his pulse is 80. His respiration is 20 and oxygen saturation is 99%.   Wt Readings from Last 3 Encounters:  09/30/17 171 lb (77.6 kg)  08/18/17 169 lb (76.7 kg)  07/16/17 168 lb (76.2 kg)    Physical Exam  Constitutional: He is oriented to person, place, and time.  HENT:  Head: Normocephalic and atraumatic.  Mouth/Throat: Oropharynx is clear and moist.  Eyes: EOM are normal. Pupils are equal, round, and reactive to light.  Neck: Normal range of motion.  Cardiovascular: Normal rate, regular rhythm and normal heart sounds.  Pulmonary/Chest: Effort normal and breath sounds normal.  Abdominal: Soft. Bowel sounds are normal.  Musculoskeletal: Normal range of motion. He exhibits no edema, tenderness or deformity.  Lymphadenopathy:    He has no cervical adenopathy.  Neurological: He is alert and oriented to person, place, and time.  Skin: Skin is warm and dry. No rash noted. No erythema.  Psychiatric: He has a normal mood and affect. His behavior is normal. Judgment and thought content normal.  Vitals reviewed.    Lab Results  Component Value Date   WBC 3.2 (L) 09/30/2017   HGB 12.3 (L) 06/30/2017   HCT 38.3 (L) 09/30/2017   MCV 100.5 (H) 09/30/2017   PLT 164 09/30/2017   Lab Results  Component Value Date   FERRITIN 37 08/18/2017   IRON 106 08/18/2017   TIBC 366 08/18/2017   UIBC 260 08/18/2017   IRONPCTSAT 29 (L) 08/18/2017   Lab Results  Component Value Date   RETICCTPCT 1.1 03/05/2017   RBC 3.81 (L) 09/30/2017   Lab Results  Component Value Date   KPAFRELGTCHN 21.0 (H) 08/18/2017   LAMBDASER 10.1 08/18/2017   KAPLAMBRATIO 2.08 (H) 08/18/2017   Lab Results  Component Value Date   IGGSERUM 687 (L) 08/18/2017   IGA 151 08/18/2017   IGMSERUM 24 08/18/2017   Lab Results  Component Value Date   TOTALPROTELP 6.2 08/18/2017   ALBUMINELP 3.8 08/18/2017   A1GS 0.2 08/18/2017   A2GS 0.6  08/18/2017   BETS 0.9 08/18/2017   GAMS 0.7 08/18/2017   MSPIKE 0.2 (H) 08/18/2017     Chemistry      Component Value Date/Time   NA 140 09/30/2017 1306   NA 143 06/30/2017 1049   NA 140 10/03/2016 0845   K 3.8 09/30/2017  1306   K 4.0 06/30/2017 1049   K 4.3 10/03/2016 0845   CL 108 09/30/2017 1306   CL 105 06/30/2017 1049   CO2 27 09/30/2017 1306   CO2 27 06/30/2017 1049   CO2 26 10/03/2016 0845   BUN 12 09/30/2017 1306   BUN 13 06/30/2017 1049   BUN 11.9 10/03/2016 0845   CREATININE 1.10 09/30/2017 1306   CREATININE 1.0 06/30/2017 1049   CREATININE 0.9 10/03/2016 0845      Component Value Date/Time   CALCIUM 9.4 09/30/2017 1306   CALCIUM 9.0 06/30/2017 1049   CALCIUM 9.9 10/03/2016 0845   ALKPHOS 65 09/30/2017 1306   ALKPHOS 57 06/30/2017 1049   ALKPHOS 80 10/03/2016 0845   AST 39 (H) 09/30/2017 1306   AST 28 10/03/2016 0845   ALT 39 09/30/2017 1306   ALT 43 06/30/2017 1049   ALT 26 10/03/2016 0845   BILITOT 0.6 09/30/2017 1306   BILITOT 0.48 10/03/2016 0845      Impression and Plan: Mr. Bastin is a pleasant 62 yo caucasian gentleman with IgG kappa myeloma. He underwent chemotherapy with RVD, followed by autologous stem cell transplant in February 2018.   I think that we can move his Zometa out to every 3 months.  He has been on Zometa for quite a while.  I think that a 61-monthintervals would be reasonable.  We will see what his myeloma panel looks like.  Hopefully, we will not see his M spike going up.  If so, we may have to add Velcade or Kyprolis.  I am just happy that he is a little more functional.  He is always been very athletic and has tried to be active.  [name] 3/20/20196:04 PM is

## 2017-10-01 LAB — IGG, IGA, IGM
IGA: 186 mg/dL (ref 61–437)
IGM (IMMUNOGLOBULIN M), SRM: 24 mg/dL (ref 20–172)
IgG (Immunoglobin G), Serum: 739 mg/dL (ref 700–1600)

## 2017-10-01 LAB — KAPPA/LAMBDA LIGHT CHAINS
KAPPA FREE LGHT CHN: 23.7 mg/L — AB (ref 3.3–19.4)
KAPPA, LAMDA LIGHT CHAIN RATIO: 2.28 — AB (ref 0.26–1.65)
LAMDA FREE LIGHT CHAINS: 10.4 mg/L (ref 5.7–26.3)

## 2017-10-02 ENCOUNTER — Other Ambulatory Visit: Payer: 59

## 2017-10-02 DIAGNOSIS — I080 Rheumatic disorders of both mitral and aortic valves: Secondary | ICD-10-CM | POA: Diagnosis not present

## 2017-10-02 DIAGNOSIS — I341 Nonrheumatic mitral (valve) prolapse: Secondary | ICD-10-CM | POA: Diagnosis not present

## 2017-10-02 DIAGNOSIS — I340 Nonrheumatic mitral (valve) insufficiency: Secondary | ICD-10-CM | POA: Diagnosis not present

## 2017-10-02 DIAGNOSIS — M549 Dorsalgia, unspecified: Secondary | ICD-10-CM | POA: Diagnosis not present

## 2017-10-02 DIAGNOSIS — I82412 Acute embolism and thrombosis of left femoral vein: Secondary | ICD-10-CM | POA: Diagnosis not present

## 2017-10-02 LAB — PROTEIN ELECTROPHORESIS, SERUM, WITH REFLEX
A/G Ratio: 1.3 (ref 0.7–1.7)
ALPHA-2-GLOBULIN: 0.5 g/dL (ref 0.4–1.0)
Albumin ELP: 3.5 g/dL (ref 2.9–4.4)
Alpha-1-Globulin: 0.2 g/dL (ref 0.0–0.4)
Beta Globulin: 1.1 g/dL (ref 0.7–1.3)
GAMMA GLOBULIN: 0.8 g/dL (ref 0.4–1.8)
GLOBULIN, TOTAL: 2.7 g/dL (ref 2.2–3.9)
M-Spike, %: 0.2 g/dL — ABNORMAL HIGH
SPEP Interpretation: 0
TOTAL PROTEIN ELP: 6.2 g/dL (ref 6.0–8.5)

## 2017-10-06 ENCOUNTER — Other Ambulatory Visit: Payer: Self-pay | Admitting: *Deleted

## 2017-10-06 DIAGNOSIS — C9000 Multiple myeloma not having achieved remission: Secondary | ICD-10-CM

## 2017-10-06 DIAGNOSIS — M545 Low back pain, unspecified: Secondary | ICD-10-CM

## 2017-10-06 DIAGNOSIS — L570 Actinic keratosis: Secondary | ICD-10-CM | POA: Diagnosis not present

## 2017-10-06 DIAGNOSIS — M7989 Other specified soft tissue disorders: Secondary | ICD-10-CM

## 2017-10-06 DIAGNOSIS — D472 Monoclonal gammopathy: Secondary | ICD-10-CM

## 2017-10-06 DIAGNOSIS — L821 Other seborrheic keratosis: Secondary | ICD-10-CM | POA: Diagnosis not present

## 2017-10-06 DIAGNOSIS — G8929 Other chronic pain: Secondary | ICD-10-CM

## 2017-10-06 DIAGNOSIS — I8290 Acute embolism and thrombosis of unspecified vein: Secondary | ICD-10-CM

## 2017-10-06 MED ORDER — LENALIDOMIDE 10 MG PO CAPS: ORAL_CAPSULE | ORAL | 0 refills | Status: DC

## 2017-10-09 ENCOUNTER — Other Ambulatory Visit: Payer: 59

## 2017-10-14 ENCOUNTER — Inpatient Hospital Stay: Payer: 59 | Attending: Hematology & Oncology

## 2017-10-14 VITALS — BP 114/70 | HR 71 | Temp 98.0°F | Resp 18

## 2017-10-14 DIAGNOSIS — C9000 Multiple myeloma not having achieved remission: Secondary | ICD-10-CM | POA: Diagnosis not present

## 2017-10-14 DIAGNOSIS — Z23 Encounter for immunization: Secondary | ICD-10-CM | POA: Insufficient documentation

## 2017-10-14 MED ORDER — DTAP-IPV-HIB VACCINE IM SUSR
0.5000 mL | Freq: Once | INTRAMUSCULAR | Status: AC
  Administered 2017-10-14: 0.5 mL via INTRAMUSCULAR
  Filled 2017-10-14: qty 1

## 2017-10-14 MED ORDER — HEPATITIS B VAC RECOMBINANT 20 MCG/ML IJ SUSP
2.0000 mL | Freq: Once | INTRAMUSCULAR | Status: AC
  Administered 2017-10-14: 40 ug via INTRAMUSCULAR
  Filled 2017-10-14: qty 2

## 2017-10-14 MED ORDER — PNEUMOCOCCAL 13-VAL CONJ VACC IM SUSP
0.5000 mL | Freq: Once | INTRAMUSCULAR | Status: AC
  Administered 2017-10-14: 0.5 mL via INTRAMUSCULAR
  Filled 2017-10-14: qty 0.5

## 2017-10-14 NOTE — Patient Instructions (Signed)
DTaP Vaccine (Diphtheria, Tetanus, and Pertussis): What You Need to Know 1. Why get vaccinated? Diphtheria, tetanus, and pertussis are serious diseases caused by bacteria. Diphtheria and pertussis are spread from person to person. Tetanus enters the body through cuts or wounds. DIPHTHERIA causes a thick covering in the back of the throat.  It can lead to breathing problems, paralysis, heart failure, and even death.  TETANUS (Lockjaw) causes painful tightening of the muscles, usually all over the body.  It can lead to "locking" of the jaw so the victim cannot open his mouth or swallow. Tetanus leads to death in up to 2 out of 10 cases.  PERTUSSIS (Whooping Cough) causes coughing spells so bad that it is hard for infants to eat, drink, or breathe. These spells can last for weeks.  It can lead to pneumonia, seizures (jerking and staring spells), brain damage, and death.  Diphtheria, tetanus, and pertussis vaccine (DTaP) can help prevent these diseases. Most children who are vaccinated with DTaP will be protected throughout childhood. Many more children would get these diseases if we stopped vaccinating. DTaP is a safer version of an older vaccine called DTP. DTP is no longer used in the [place]. 2. Who should get DTaP vaccine and when? Children should get 5 doses of DTaP vaccine, one dose at each of the following ages:  2 months  4 months  6 months  15-18 months  4-6 years  DTaP may be given at the same time as other vaccines. 3. Some children should not get DTaP vaccine or should wait  Children with minor illnesses, such as a cold, may be vaccinated. But children who are moderately or severely ill should usually wait until they recover before getting DTaP vaccine.  Any child who had a life-threatening allergic reaction after a dose of DTaP should not get another dose.  Any child who suffered a brain or nervous system disease within 7 days after a dose of DTaP should not get  another dose.  Talk with your doctor if your child: ? had a seizure or collapsed after a dose of DTaP, ? cried non-stop for 3 hours or more after a dose of DTaP, ? had a fever over 105F after a dose of DTaP. Ask your doctor for more information. Some of these children should not get another dose of pertussis vaccine, but may get a vaccine without pertussis, called DT. 4. Older children and adults DTaP is not licensed for adolescents, adults, or children 83 years of age and older. But older people still need protection. A vaccine called Tdap is similar to DTaP. A single dose of Tdap is recommended for people 11 through 62 years of age. Another vaccine, called Td, protects against tetanus and diphtheria, but not pertussis. It is recommended every 10 years. There are separate Vaccine Information Statements for these vaccines. 5. What are the risks from DTaP vaccine? Getting diphtheria, tetanus, or pertussis disease is much riskier than getting DTaP vaccine. However, a vaccine, like any medicine, is capable of causing serious problems, such as severe allergic reactions. The risk of DTaP vaccine causing serious harm, or death, is extremely small. Mild problems (common)  Fever (up to about 1 child in 4)  Redness or swelling where the shot was given (up to about 1 child in 4)  Soreness or tenderness where the shot was given (up to about 1 child in 4) These problems occur more often after the 4th and 5th doses of the DTaP series than after  earlier doses. Sometimes the 4th or 5th dose of DTaP vaccine is followed by swelling of the entire arm or leg in which the shot was given, lasting 1-7 days (up to about 1 child in 75). Other mild problems include:  Fussiness (up to about 1 child in 3)  Tiredness or poor appetite (up to about 1 child in 10)  Vomiting (up to about 1 child in 24) These problems generally occur 1-3 days after the shot. Moderate problems (uncommon)  Seizure (jerking or staring)  (about 1 child out of 14,000)  Non-stop crying, for 3 hours or more (up to about 1 child out of 1,000)  High fever, over 105F (about 1 child out of 16,000) Severe problems (very rare)  Serious allergic reaction (less than 1 out of a million doses)  Several other severe problems have been reported after DTaP vaccine. These include: ? Long-term seizures, coma, or lowered consciousness ? Permanent brain damage. These are so rare it is hard to tell if they are caused by the vaccine. Controlling fever is especially important for children who have had seizures, for any reason. It is also important if another family member has had seizures. You can reduce fever and pain by giving your child an aspirin-free pain reliever when the shot is given, and for the next 24 hours, following the package instructions. 6. What if there is a serious reaction? What should I look for? Look for anything that concerns you, such as signs of a severe allergic reaction, very high fever, or behavior changes. Signs of a severe allergic reaction can include hives, swelling of the face and throat, difficulty breathing, a fast heartbeat, dizziness, and weakness. These would start a few minutes to a few hours after the vaccination. What should I do?  If you think it is a severe allergic reaction or other emergency that can't wait, call 9-1-1 or get the person to the nearest hospital. Otherwise, call your doctor.  Afterward, the reaction should be reported to the Vaccine Adverse Event Reporting System (VAERS). Your doctor might file this report, or you can do it yourself through the VAERS web site at www.vaers.SamedayNews.es, or by calling 4420863823. ? VAERS is only for reporting reactions. They do not give medical advice. 7. The National Vaccine Injury Compensation Program The [employer] Vaccine Injury Compensation Program (VICP) is a federal program that was created to compensate people who may have been injured by certain  vaccines. Persons who believe they may have been injured by a vaccine can learn about the program and about filing a claim by calling (938) 038-9751 or visiting the [place] website at GoldCloset.com.ee. 8. How can I learn more?  Ask your doctor.  Call your local or state health department.  Contact the Centers for Disease Control and Prevention (CDC): ? Call (419)141-2415 (1-800-CDC-INFO) or ? Visit CDC's website at http://hunter.com/ CDC DTaP Vaccine (Diphtheria, Tetanus, and Pertussis) VIS (11/27/05) This information is not intended to replace advice given to you by your health care provider. Make sure you discuss any questions you have with your health care provider. Document Released: 04/27/2006 Document Revised: 03/20/2016 Document Reviewed: 03/20/2016 Elsevier Interactive Patient Education  2017 [place]. Hepatitis B Vaccine, Recombinant injection What is this medicine? HEPATITIS B VACCINE (hep uh TAHY tis B VAK seen) is a vaccine. It is used to prevent an infection with the hepatitis B virus. This medicine may be used for other purposes; ask your health care provider or pharmacist if you have questions. COMMON BRAND NAME(S):  Engerix-B, Recombivax HB What should I tell my health care provider before I take this medicine? They need to know if you have any of these conditions: -fever, infection -heart disease -hepatitis B infection -immune system problems -kidney disease -an unusual or allergic reaction to vaccines, yeast, other medicines, foods, dyes, or preservatives -pregnant or trying to get pregnant -breast-feeding How should I use this medicine? This vaccine is for injection into a muscle. It is given by a health care professional. A copy of Vaccine Information Statements will be given before each vaccination. Read this sheet carefully each time. The sheet may change frequently. Talk to your pediatrician regarding the use of this medicine in children.  While this drug may be prescribed for children as young as newborn for selected conditions, precautions do apply. Overdosage: If you think you have taken too much of this medicine contact a poison control center or emergency room at once. NOTE: This medicine is only for you. Do not share this medicine with others. What if I miss a dose? It is important not to miss your dose. Call your doctor or health care professional if you are unable to keep an appointment. What may interact with this medicine? -medicines that suppress your immune function like adalimumab, anakinra, infliximab -medicines to treat cancer -steroid medicines like prednisone or cortisone This list may not describe all possible interactions. Give your health care provider a list of all the medicines, herbs, non-prescription drugs, or dietary supplements you use. Also tell them if you smoke, drink alcohol, or use illegal drugs. Some items may interact with your medicine. What should I watch for while using this medicine? See your health care provider for all shots of this vaccine as directed. You must have 3 shots of this vaccine for protection from hepatitis B infection. Tell your doctor right away if you have any serious or unusual side effects after getting this vaccine. What side effects may I notice from receiving this medicine? Side effects that you should report to your doctor or health care professional as soon as possible: -allergic reactions like skin rash, itching or hives, swelling of the face, lips, or tongue -breathing problems -confused, irritated -fast, irregular heartbeat -flu-like syndrome -numb, tingling pain -seizures -unusually weak or tired Side effects that usually do not require medical attention (report to your doctor or health care professional if they continue or are bothersome): -diarrhea -fever -headache -loss of appetite -muscle pain -nausea -pain, redness, swelling, or irritation at site where  injected -tiredness This list may not describe all possible side effects. Call your doctor for medical advice about side effects. You may report side effects to FDA at 1-800-FDA-1088. Where should I keep my medicine? This drug is given in a hospital or clinic and will not be stored at home. NOTE: This sheet is a summary. It may not cover all possible information. If you have questions about this medicine, talk to your doctor, pharmacist, or health care provider.  2018 Elsevier/Gold Standard (2013-10-31 13:26:01) Pneumococcal Conjugate Vaccine suspension for injection What is this medicine? PNEUMOCOCCAL VACCINE (NEU mo KOK al vak SEEN) is a vaccine used to prevent pneumococcus bacterial infections. These bacteria can cause serious infections like pneumonia, meningitis, and blood infections. This vaccine will lower your chance of getting pneumonia. If you do get pneumonia, it can make your symptoms milder and your illness shorter. This vaccine will not treat an infection and will not cause infection. This vaccine is recommended for infants and young children, adults with certain  medical conditions, and adults 77 years or older. This medicine may be used for other purposes; ask your health care provider or pharmacist if you have questions. COMMON BRAND NAME(S): Prevnar, Prevnar 13 What should I tell my health care provider before I take this medicine? They need to know if you have any of these conditions: -bleeding problems -fever -immune system problems -an unusual or allergic reaction to pneumococcal vaccine, diphtheria toxoid, other vaccines, latex, other medicines, foods, dyes, or preservatives -pregnant or trying to get pregnant -breast-feeding How should I use this medicine? This vaccine is for injection into a muscle. It is given by a health care professional. A copy of Vaccine Information Statements will be given before each vaccination. Read this sheet carefully each time. The sheet may  change frequently. Talk to your pediatrician regarding the use of this medicine in children. While this drug may be prescribed for children as young as 9 weeks old for selected conditions, precautions do apply. Overdosage: If you think you have taken too much of this medicine contact a poison control center or emergency room at once. NOTE: This medicine is only for you. Do not share this medicine with others. What if I miss a dose? It is important not to miss your dose. Call your doctor or health care professional if you are unable to keep an appointment. What may interact with this medicine? -medicines for cancer chemotherapy -medicines that suppress your immune function -steroid medicines like prednisone or cortisone This list may not describe all possible interactions. Give your health care provider a list of all the medicines, herbs, non-prescription drugs, or dietary supplements you use. Also tell them if you smoke, drink alcohol, or use illegal drugs. Some items may interact with your medicine. What should I watch for while using this medicine? Mild fever and pain should go away in 3 days or less. Report any unusual symptoms to your doctor or health care professional. What side effects may I notice from receiving this medicine? Side effects that you should report to your doctor or health care professional as soon as possible: -allergic reactions like skin rash, itching or hives, swelling of the face, lips, or tongue -breathing problems -confused -fast or irregular heartbeat -fever over 102 degrees F -seizures -unusual bleeding or bruising -unusual muscle weakness Side effects that usually do not require medical attention (report to your doctor or health care professional if they continue or are bothersome): -aches and pains -diarrhea -fever of 102 degrees F or less -headache -irritable -loss of appetite -pain, tender at site where injected -trouble sleeping This list may not  describe all possible side effects. Call your doctor for medical advice about side effects. You may report side effects to FDA at 1-800-FDA-1088. Where should I keep my medicine? This does not apply. This vaccine is given in a clinic, pharmacy, doctor's office, or other health care setting and will not be stored at home. NOTE: This sheet is a summary. It may not cover all possible information. If you have questions about this medicine, talk to your doctor, pharmacist, or health care provider.  2018 Elsevier/Gold Standard (2014-04-06 10:27:27)

## 2017-10-16 ENCOUNTER — Other Ambulatory Visit: Payer: 59

## 2017-10-19 ENCOUNTER — Other Ambulatory Visit: Payer: Self-pay | Admitting: Hematology & Oncology

## 2017-10-19 DIAGNOSIS — M545 Low back pain: Secondary | ICD-10-CM

## 2017-10-19 DIAGNOSIS — D472 Monoclonal gammopathy: Secondary | ICD-10-CM

## 2017-10-19 DIAGNOSIS — G8929 Other chronic pain: Secondary | ICD-10-CM

## 2017-10-19 DIAGNOSIS — C9000 Multiple myeloma not having achieved remission: Secondary | ICD-10-CM

## 2017-10-21 ENCOUNTER — Telehealth: Payer: Self-pay | Admitting: *Deleted

## 2017-10-21 NOTE — Telephone Encounter (Signed)
Call received from patient requesting a MRI of his shoulder d/t increased pain.  Call placed back to patient and message left to inform him that Dr. Marin Olp is out of the office until Monday and we will notify him of patient's concern on Monday and call with any orders from Dr. Marin Olp.  Instructed pt to also inform his ortho MD at [facility] regarding shoulder pain.  Instructed pt to call back with any further questions or concerns.

## 2017-10-23 ENCOUNTER — Other Ambulatory Visit: Payer: Self-pay | Admitting: *Deleted

## 2017-10-23 DIAGNOSIS — G8929 Other chronic pain: Secondary | ICD-10-CM

## 2017-10-23 DIAGNOSIS — M7989 Other specified soft tissue disorders: Secondary | ICD-10-CM

## 2017-10-23 DIAGNOSIS — C9000 Multiple myeloma not having achieved remission: Secondary | ICD-10-CM

## 2017-10-23 DIAGNOSIS — M545 Low back pain, unspecified: Secondary | ICD-10-CM

## 2017-10-23 DIAGNOSIS — D472 Monoclonal gammopathy: Secondary | ICD-10-CM

## 2017-10-23 DIAGNOSIS — I8290 Acute embolism and thrombosis of unspecified vein: Secondary | ICD-10-CM

## 2017-10-23 MED ORDER — LENALIDOMIDE 10 MG PO CAPS: ORAL_CAPSULE | ORAL | 0 refills | Status: DC

## 2017-10-29 DIAGNOSIS — M25511 Pain in right shoulder: Secondary | ICD-10-CM | POA: Diagnosis not present

## 2017-11-02 ENCOUNTER — Other Ambulatory Visit: Payer: Self-pay | Admitting: *Deleted

## 2017-11-02 DIAGNOSIS — M545 Low back pain: Secondary | ICD-10-CM

## 2017-11-02 DIAGNOSIS — G8929 Other chronic pain: Secondary | ICD-10-CM

## 2017-11-02 DIAGNOSIS — C9000 Multiple myeloma not having achieved remission: Secondary | ICD-10-CM

## 2017-11-02 DIAGNOSIS — D472 Monoclonal gammopathy: Secondary | ICD-10-CM

## 2017-11-02 MED ORDER — XARELTO 10 MG PO TABS: 10.0000 mg | ORAL_TABLET | Freq: Every day | ORAL | 12 refills | Status: DC

## 2017-11-02 MED ORDER — FAMCICLOVIR 500 MG PO TABS
500.0000 mg | ORAL_TABLET | Freq: Every day | ORAL | 0 refills | Status: DC
Start: 2017-11-02 — End: 2018-04-07

## 2017-11-09 ENCOUNTER — Other Ambulatory Visit: Payer: Self-pay

## 2017-11-09 ENCOUNTER — Encounter (HOSPITAL_BASED_OUTPATIENT_CLINIC_OR_DEPARTMENT_OTHER): Payer: Self-pay | Admitting: Emergency Medicine

## 2017-11-09 ENCOUNTER — Inpatient Hospital Stay (HOSPITAL_BASED_OUTPATIENT_CLINIC_OR_DEPARTMENT_OTHER)
Admission: EM | Admit: 2017-11-09 | Discharge: 2017-11-13 | DRG: 871 | Disposition: A | Discharge status: Home / Self Care | Payer: 59 | Attending: Internal Medicine | Admitting: Internal Medicine

## 2017-11-09 ENCOUNTER — Emergency Department (HOSPITAL_BASED_OUTPATIENT_CLINIC_OR_DEPARTMENT_OTHER): Payer: 59

## 2017-11-09 DIAGNOSIS — D61810 Antineoplastic chemotherapy induced pancytopenia: Secondary | ICD-10-CM | POA: Diagnosis present

## 2017-11-09 DIAGNOSIS — Z9221 Personal history of antineoplastic chemotherapy: Secondary | ICD-10-CM

## 2017-11-09 DIAGNOSIS — A419 Sepsis, unspecified organism: Secondary | ICD-10-CM | POA: Diagnosis not present

## 2017-11-09 DIAGNOSIS — K219 Gastro-esophageal reflux disease without esophagitis: Secondary | ICD-10-CM | POA: Diagnosis present

## 2017-11-09 DIAGNOSIS — K449 Diaphragmatic hernia without obstruction or gangrene: Secondary | ICD-10-CM | POA: Diagnosis present

## 2017-11-09 DIAGNOSIS — A408 Other streptococcal sepsis: Secondary | ICD-10-CM | POA: Diagnosis not present

## 2017-11-09 DIAGNOSIS — Z8049 Family history of malignant neoplasm of other genital organs: Secondary | ICD-10-CM

## 2017-11-09 DIAGNOSIS — Z8249 Family history of ischemic heart disease and other diseases of the circulatory system: Secondary | ICD-10-CM | POA: Diagnosis not present

## 2017-11-09 DIAGNOSIS — Z86718 Personal history of other venous thrombosis and embolism: Secondary | ICD-10-CM | POA: Diagnosis not present

## 2017-11-09 DIAGNOSIS — J111 Influenza due to unidentified influenza virus with other respiratory manifestations: Secondary | ICD-10-CM | POA: Diagnosis not present

## 2017-11-09 DIAGNOSIS — R509 Fever, unspecified: Secondary | ICD-10-CM

## 2017-11-09 DIAGNOSIS — J122 Parainfluenza virus pneumonia: Secondary | ICD-10-CM | POA: Diagnosis present

## 2017-11-09 DIAGNOSIS — Z813 Family history of other psychoactive substance abuse and dependence: Secondary | ICD-10-CM | POA: Diagnosis not present

## 2017-11-09 DIAGNOSIS — R7881 Bacteremia: Secondary | ICD-10-CM | POA: Diagnosis present

## 2017-11-09 DIAGNOSIS — J101 Influenza due to other identified influenza virus with other respiratory manifestations: Secondary | ICD-10-CM | POA: Diagnosis not present

## 2017-11-09 DIAGNOSIS — Z809 Family history of malignant neoplasm, unspecified: Secondary | ICD-10-CM | POA: Diagnosis not present

## 2017-11-09 DIAGNOSIS — A4181 Sepsis due to Enterococcus: Secondary | ICD-10-CM | POA: Diagnosis not present

## 2017-11-09 DIAGNOSIS — F329 Major depressive disorder, single episode, unspecified: Secondary | ICD-10-CM | POA: Diagnosis not present

## 2017-11-09 DIAGNOSIS — T451X5A Adverse effect of antineoplastic and immunosuppressive drugs, initial encounter: Secondary | ICD-10-CM | POA: Diagnosis not present

## 2017-11-09 DIAGNOSIS — J189 Pneumonia, unspecified organism: Secondary | ICD-10-CM

## 2017-11-09 DIAGNOSIS — Z818 Family history of other mental and behavioral disorders: Secondary | ICD-10-CM | POA: Diagnosis not present

## 2017-11-09 DIAGNOSIS — B952 Enterococcus as the cause of diseases classified elsewhere: Secondary | ICD-10-CM | POA: Diagnosis not present

## 2017-11-09 DIAGNOSIS — C9001 Multiple myeloma in remission: Secondary | ICD-10-CM | POA: Diagnosis not present

## 2017-11-09 DIAGNOSIS — Z9484 Stem cells transplant status: Secondary | ICD-10-CM

## 2017-11-09 DIAGNOSIS — Z820 Family history of epilepsy and other diseases of the nervous system: Secondary | ICD-10-CM | POA: Diagnosis not present

## 2017-11-09 DIAGNOSIS — Z823 Family history of stroke: Secondary | ICD-10-CM | POA: Diagnosis not present

## 2017-11-09 DIAGNOSIS — R3129 Other microscopic hematuria: Secondary | ICD-10-CM | POA: Diagnosis present

## 2017-11-09 DIAGNOSIS — F419 Anxiety disorder, unspecified: Secondary | ICD-10-CM | POA: Diagnosis present

## 2017-11-09 DIAGNOSIS — R0602 Shortness of breath: Secondary | ICD-10-CM | POA: Diagnosis not present

## 2017-11-09 DIAGNOSIS — J129 Viral pneumonia, unspecified: Secondary | ICD-10-CM | POA: Diagnosis not present

## 2017-11-09 DIAGNOSIS — M545 Low back pain: Secondary | ICD-10-CM | POA: Diagnosis not present

## 2017-11-09 DIAGNOSIS — Z7901 Long term (current) use of anticoagulants: Secondary | ICD-10-CM

## 2017-11-09 DIAGNOSIS — R0902 Hypoxemia: Secondary | ICD-10-CM | POA: Diagnosis present

## 2017-11-09 DIAGNOSIS — C9000 Multiple myeloma not having achieved remission: Secondary | ICD-10-CM

## 2017-11-09 DIAGNOSIS — D696 Thrombocytopenia, unspecified: Secondary | ICD-10-CM | POA: Diagnosis present

## 2017-11-09 DIAGNOSIS — Z87442 Personal history of urinary calculi: Secondary | ICD-10-CM

## 2017-11-09 DIAGNOSIS — Z806 Family history of leukemia: Secondary | ICD-10-CM | POA: Diagnosis not present

## 2017-11-09 DIAGNOSIS — Z923 Personal history of irradiation: Secondary | ICD-10-CM

## 2017-11-09 DIAGNOSIS — J1000 Influenza due to other identified influenza virus with unspecified type of pneumonia: Secondary | ICD-10-CM | POA: Diagnosis not present

## 2017-11-09 DIAGNOSIS — Y95 Nosocomial condition: Secondary | ICD-10-CM | POA: Diagnosis present

## 2017-11-09 DIAGNOSIS — R05 Cough: Secondary | ICD-10-CM | POA: Diagnosis not present

## 2017-11-09 DIAGNOSIS — Z87891 Personal history of nicotine dependence: Secondary | ICD-10-CM

## 2017-11-09 DIAGNOSIS — I351 Nonrheumatic aortic (valve) insufficiency: Secondary | ICD-10-CM | POA: Diagnosis not present

## 2017-11-09 LAB — URINALYSIS, MICROSCOPIC (REFLEX)

## 2017-11-09 LAB — COMPREHENSIVE METABOLIC PANEL
ALT: 46 U/L (ref 17–63)
AST: 47 U/L — AB (ref 15–41)
Albumin: 4.4 g/dL (ref 3.5–5.0)
Alkaline Phosphatase: 60 U/L (ref 38–126)
Anion gap: 11 (ref 5–15)
BILIRUBIN TOTAL: 0.6 mg/dL (ref 0.3–1.2)
BUN: 15 mg/dL (ref 6–20)
CO2: 23 mmol/L (ref 22–32)
Calcium: 8.7 mg/dL — ABNORMAL LOW (ref 8.9–10.3)
Chloride: 101 mmol/L (ref 101–111)
Creatinine, Ser: 0.92 mg/dL (ref 0.61–1.24)
GFR calc Af Amer: >60 mL/min (ref >60)
Glucose, Bld: 127 mg/dL — ABNORMAL HIGH (ref 65–99)
Potassium: 3.8 mmol/L (ref 3.5–5.1)
Sodium: 135 mmol/L (ref 135–145)
TOTAL PROTEIN: 7.1 g/dL (ref 6.5–8.1)

## 2017-11-09 LAB — CBC WITH DIFFERENTIAL/PLATELET
BASOS ABS: 0 10*3/uL (ref 0.0–0.1)
BASOS PCT: 0 %
EOS ABS: 0.1 10*3/uL (ref 0.0–0.7)
Eosinophils Relative: 1 %
HCT: 36.5 % — ABNORMAL LOW (ref 39.0–52.0)
Hemoglobin: 12 g/dL — ABNORMAL LOW (ref 13.0–17.0)
Lymphocytes Relative: 14 %
Lymphs Abs: 0.5 10*3/uL — ABNORMAL LOW (ref 0.7–4.0)
MCH: 31.3 pg (ref 26.0–34.0)
MCHC: 32.9 g/dL (ref 30.0–36.0)
MCV: 95.1 fL (ref 78.0–100.0)
MONO ABS: 0.4 10*3/uL (ref 0.1–1.0)
Monocytes Relative: 11 %
Neutro Abs: 2.6 10*3/uL (ref 1.7–7.7)
Neutrophils Relative %: 74 %
PLATELETS: 137 10*3/uL — AB (ref 150–400)
RBC: 3.84 MIL/uL — AB (ref 4.22–5.81)
RDW: 13.9 % (ref 11.5–15.5)
WBC: 3.6 10*3/uL — AB (ref 4.0–10.5)

## 2017-11-09 LAB — URINALYSIS, ROUTINE W REFLEX MICROSCOPIC
Bilirubin Urine: NEGATIVE
Glucose, UA: NEGATIVE mg/dL
Ketones, ur: NEGATIVE mg/dL
Leukocytes, UA: NEGATIVE
Nitrite: NEGATIVE
PROTEIN: NEGATIVE mg/dL
SPECIFIC GRAVITY, URINE: 1.02 (ref 1.005–1.030)
pH: 6 (ref 5.0–8.0)

## 2017-11-09 LAB — PROTIME-INR
INR: 1.24
PROTHROMBIN TIME: 15.5 s — AB (ref 11.4–15.2)

## 2017-11-09 LAB — I-STAT CG4 LACTIC ACID, ED: LACTIC ACID, VENOUS: 1.54 mmol/L (ref 0.5–1.9)

## 2017-11-09 LAB — INFLUENZA PANEL BY PCR (TYPE A & B)
INFLAPCR: NEGATIVE
INFLBPCR: NEGATIVE

## 2017-11-09 MED ORDER — ONDANSETRON HCL 4 MG PO TABS: 4.0000 mg | ORAL_TABLET | Freq: Four times a day (QID) | ORAL | Status: DC | PRN

## 2017-11-09 MED ORDER — FAMCICLOVIR 500 MG PO TABS
500.0000 mg | ORAL_TABLET | Freq: Every day | ORAL | Status: DC
  Administered 2017-11-10 – 2017-11-12 (×3): 500 mg via ORAL
  Filled 2017-11-09 (×5): qty 1

## 2017-11-09 MED ORDER — KETOROLAC TROMETHAMINE 30 MG/ML IJ SOLN
30.0000 mg | Freq: Once | INTRAMUSCULAR | Status: AC
  Administered 2017-11-09: 30 mg via INTRAVENOUS
  Filled 2017-11-09: qty 1

## 2017-11-09 MED ORDER — LORATADINE 10 MG PO TABS
10.0000 mg | ORAL_TABLET | Freq: Every day | ORAL | Status: DC | PRN
  Filled 2017-11-09: qty 1

## 2017-11-09 MED ORDER — ONDANSETRON HCL 4 MG/2ML IJ SOLN
4.0000 mg | Freq: Four times a day (QID) | INTRAMUSCULAR | Status: DC | PRN
  Administered 2017-11-09: 4 mg via INTRAVENOUS
  Filled 2017-11-09: qty 2

## 2017-11-09 MED ORDER — SODIUM CHLORIDE 0.9% FLUSH
3.0000 mL | Freq: Two times a day (BID) | INTRAVENOUS | Status: DC
  Administered 2017-11-09 – 2017-11-12 (×7): 3 mL via INTRAVENOUS

## 2017-11-09 MED ORDER — CEFEPIME HCL 1 G IJ SOLR
INTRAMUSCULAR | Status: AC
  Filled 2017-11-09: qty 1

## 2017-11-09 MED ORDER — BENZONATATE 100 MG PO CAPS
200.0000 mg | ORAL_CAPSULE | Freq: Three times a day (TID) | ORAL | Status: DC
  Administered 2017-11-09 – 2017-11-12 (×10): 200 mg via ORAL
  Filled 2017-11-09 (×10): qty 2

## 2017-11-09 MED ORDER — SODIUM CHLORIDE 0.9 % IV SOLN
1.0000 g | Freq: Once | INTRAVENOUS | Status: AC
  Administered 2017-11-09: 1 g via INTRAVENOUS

## 2017-11-09 MED ORDER — ACETAMINOPHEN 325 MG PO TABS
650.0000 mg | ORAL_TABLET | Freq: Four times a day (QID) | ORAL | Status: DC | PRN
  Administered 2017-11-10 – 2017-11-11 (×4): 650 mg via ORAL
  Filled 2017-11-09 (×4): qty 2

## 2017-11-09 MED ORDER — SODIUM CHLORIDE 0.9 % IV BOLUS
1500.0000 mL | Freq: Once | INTRAVENOUS | Status: AC
  Administered 2017-11-09: 1500 mL via INTRAVENOUS

## 2017-11-09 MED ORDER — PROBIOTIC PO CAPS: 1.0000 | ORAL_CAPSULE | Freq: Every day | ORAL | Status: DC

## 2017-11-09 MED ORDER — VANCOMYCIN HCL IN DEXTROSE 1-5 GM/200ML-% IV SOLN
1000.0000 mg | Freq: Two times a day (BID) | INTRAVENOUS | Status: DC
  Administered 2017-11-09 – 2017-11-11 (×5): 1000 mg via INTRAVENOUS
  Filled 2017-11-09 (×5): qty 200

## 2017-11-09 MED ORDER — ACETAMINOPHEN 650 MG RE SUPP: 650.0000 mg | Freq: Four times a day (QID) | RECTAL | Status: DC | PRN

## 2017-11-09 MED ORDER — MAGNESIUM OXIDE 400 MG PO TABS: 400.0000 mg | ORAL_TABLET | Freq: Every day | ORAL | Status: DC

## 2017-11-09 MED ORDER — ALBUTEROL SULFATE (2.5 MG/3ML) 0.083% IN NEBU
2.5000 mg | INHALATION_SOLUTION | RESPIRATORY_TRACT | Status: DC | PRN
  Administered 2017-11-10: 2.5 mg via RESPIRATORY_TRACT
  Filled 2017-11-09: qty 3

## 2017-11-09 MED ORDER — GUAIFENESIN-DM 100-10 MG/5ML PO SYRP
5.0000 mL | ORAL_SOLUTION | ORAL | Status: DC | PRN
  Administered 2017-11-09 – 2017-11-11 (×5): 5 mL via ORAL
  Filled 2017-11-09 (×6): qty 10

## 2017-11-09 MED ORDER — IBUPROFEN 200 MG PO TABS: 600.0000 mg | ORAL_TABLET | Freq: Four times a day (QID) | ORAL | Status: DC | PRN

## 2017-11-09 MED ORDER — SERTRALINE HCL 100 MG PO TABS
100.0000 mg | ORAL_TABLET | Freq: Every day | ORAL | Status: DC
  Administered 2017-11-09 – 2017-11-12 (×4): 100 mg via ORAL
  Filled 2017-11-09 (×4): qty 1

## 2017-11-09 MED ORDER — VITAMIN D3 25 MCG (1000 UNIT) PO TABS
1000.0000 [IU] | ORAL_TABLET | Freq: Every day | ORAL | Status: DC
  Administered 2017-11-10 – 2017-11-12 (×3): 1000 [IU] via ORAL
  Filled 2017-11-09 (×3): qty 1

## 2017-11-09 MED ORDER — POLYETHYLENE GLYCOL 3350 17 GM/SCOOP PO POWD
17.0000 g | Freq: Every day | ORAL | Status: DC | PRN
  Filled 2017-11-09: qty 255

## 2017-11-09 MED ORDER — ZOLPIDEM TARTRATE 5 MG PO TABS
5.0000 mg | ORAL_TABLET | Freq: Every evening | ORAL | Status: DC | PRN
Start: 2017-11-09 — End: 2017-11-13
  Administered 2017-11-09 – 2017-11-12 (×3): 5 mg via ORAL
  Filled 2017-11-09 (×5): qty 1

## 2017-11-09 MED ORDER — RISAQUAD PO CAPS
1.0000 | ORAL_CAPSULE | Freq: Every day | ORAL | Status: DC
  Administered 2017-11-10 – 2017-11-12 (×3): 1 via ORAL
  Filled 2017-11-09 (×3): qty 1

## 2017-11-09 MED ORDER — CEFEPIME HCL 1 G IJ SOLR
1.0000 g | Freq: Three times a day (TID) | INTRAMUSCULAR | Status: DC
Start: 2017-11-09 — End: 2017-11-11
  Administered 2017-11-09 – 2017-11-11 (×7): 1 g via INTRAVENOUS
  Filled 2017-11-09 (×8): qty 1

## 2017-11-09 MED ORDER — ACETAMINOPHEN 500 MG PO TABS
1000.0000 mg | ORAL_TABLET | Freq: Once | ORAL | Status: AC
  Administered 2017-11-09: 1000 mg via ORAL
  Filled 2017-11-09: qty 2

## 2017-11-09 MED ORDER — ALBUTEROL SULFATE (2.5 MG/3ML) 0.083% IN NEBU
2.5000 mg | INHALATION_SOLUTION | Freq: Four times a day (QID) | RESPIRATORY_TRACT | Status: DC
  Administered 2017-11-09: 2.5 mg via RESPIRATORY_TRACT
  Filled 2017-11-09: qty 3

## 2017-11-09 MED ORDER — RIVAROXABAN 10 MG PO TABS
10.0000 mg | ORAL_TABLET | Freq: Every evening | ORAL | Status: DC
  Administered 2017-11-09 – 2017-11-12 (×4): 10 mg via ORAL
  Filled 2017-11-09 (×5): qty 1

## 2017-11-09 MED ORDER — POLYETHYLENE GLYCOL 3350 17 G PO PACK: 17.0000 g | PACK | Freq: Every day | ORAL | Status: DC | PRN

## 2017-11-09 MED ORDER — SODIUM CHLORIDE 0.9 % IV SOLN
INTRAVENOUS | Status: AC
  Administered 2017-11-09 – 2017-11-10 (×2): via INTRAVENOUS

## 2017-11-09 MED ORDER — ALBUTEROL SULFATE (2.5 MG/3ML) 0.083% IN NEBU
2.5000 mg | INHALATION_SOLUTION | Freq: Three times a day (TID) | RESPIRATORY_TRACT | Status: DC
Start: 2017-11-10 — End: 2017-11-13
  Administered 2017-11-10 – 2017-11-13 (×10): 2.5 mg via RESPIRATORY_TRACT
  Filled 2017-11-09 (×10): qty 3

## 2017-11-09 MED ORDER — MAGNESIUM OXIDE 400 (241.3 MG) MG PO TABS
400.0000 mg | ORAL_TABLET | Freq: Every day | ORAL | Status: DC
  Administered 2017-11-10 – 2017-11-12 (×3): 400 mg via ORAL
  Filled 2017-11-09 (×3): qty 1

## 2017-11-09 NOTE — Progress Notes (Signed)
Pharmacy Antibiotic Note  Jorge [name] is a 63 y.o. male admitted on 11/09/2017 with pneumonia.    Plan: Cefepime 1 g q8h Vanc 1g q12h Monitor renal fx cx vt prn  Height: 5\' 7"  (170.2 cm) Weight: 170 lb (77.1 kg) IBW/kg (Calculated) : 66.1  Temp (24hrs), Avg:102.8 F (39.3 C), Min:102.8 F (39.3 C), Max:102.8 F (39.3 C)  Recent Labs  Lab 11/09/17 0827 11/09/17 0834  WBC 3.6*  --   CREATININE 0.92  --   LATICACIDVEN  --  1.54    Estimated Creatinine Clearance: 78.8 mL/min (by C-G formula based on SCr of 0.92 mg/dL).    No Known Allergies  Levester Fresh, PharmD, BCPS, BCCCP Clinical Pharmacist Clinical phone for 11/09/2017 from 7a-3:30p: [phone] If after 3:30p, please call main pharmacy at: [phone] 11/09/2017 9:01 AM

## 2017-11-09 NOTE — ED Triage Notes (Signed)
Pt started having a cough at night on Friday.  Nasal congestion, runny nose, productive cough, fever, sob on exertion.  Symptoms progressively worsening, pt is chemo pt.

## 2017-11-09 NOTE — H&P (Addendum)
History and Physical    Jorge [name] KKX:[id number] DOB: 01-Jan-1956 DOA: 11/09/2017  PCP: Elise Benne   I have briefly reviewed patients previous medical reports in [facility].  Patient coming from: Home  Chief Complaint: Cough, dyspnea, fever.  HPI: Jorge [name] is a 62 year old married male, independent and physically quite active, PMH of multiple myeloma on Revlimid, bony metastasis, chronic back pain related to same, history of DVT for which she completed a full dose anticoagulation and currently is on prophylactic dose Xarelto for the last year, anxiety, depression, GERD, presented to [facility] with above complaints.  He was in his usual state of health until 2 days ago when he developed nonproductive cough which progressively got worse until last night he was coughing continuously, associated dyspnea, wheezing, this morning noted headache and presented to the ED where noted to have high fever of 102.8 F.  He has chronic runny nose for the last couple of months.  No earache or sore throat reported.  Appetite decreased.  ED Course: Chest x-ray showed right hemi-diaphragm elevation with right base atelectasis and a large hiatal hernia but no obvious pneumonia.  Urine microscopy negative for UTI features.  Started on IV fluids and empirically on IV cefepime and vancomycin for suspected developing pneumonia.  Transferred to [facility].  Review of Systems:  All other systems reviewed and apart from HPI, are negative.  Past Medical History:  Diagnosis Date  . Anxiety   . Bone metastasis ([place])   . Chest cold 05/19/2016   productive cough  -- started on antibiotic  . Chronic back pain    due to bone mets from myeloma  . Cough   . Depression   . GERD (gastroesophageal reflux disease)   . Hiatal hernia   . History of chicken pox   . History of concussion    age 3 -- no residual  . History of DVT of lower extremity 03/21/2016  treated and  completed w/ xarelto   per doppler left extensive occlusion common femoral, femoral, and popliteal veins and right partial occlusion common femoral and profunda femoral veins/  last doppler 06-04-2016 no evidence acute or chronic dvt noted either leg   . History of radiation therapy 06/09/16-06/23/16   lower thoracic spine 25 Gy in 10 fractions  . Mouth ulcers    secondary to radiation  . Multiple myeloma ([place]) dx 02/22/2016 via bone marrow bx---  oncologist-  [name]   IgG Kappa-- Hyperdiploid/ +11 w/ bone mets--  current treatment chemotherapy (started 08/ 2017)and pallitive radiation to back started 06-09-2016  . Renal calculus, right   . Wears contact lenses     Past Surgical History:  Procedure Laterality Date  . COLONOSCOPY  [id number]  . CYSTOSCOPY W/ URETERAL STENT PLACEMENT Right 06/20/2016   Procedure: CYSTOSCOPY WITH STENT REPLACEMENT;  Surgeon: Kathie Rhodes, MD;  Location: [facility];  Service: Urology;  Laterality: Right;  . CYSTOSCOPY WITH RETROGRADE PYELOGRAM, URETEROSCOPY AND STENT PLACEMENT Right 05/30/2016   Procedure: CYSTOSCOPY WITH RETROGRADE PYELOGRAM, URETEROSCOPY AND STENT PLACEMENT,DILITATION URETERAL STRICTURE;  Surgeon: Kathie Rhodes, MD;  Location: [facility];  Service: Urology;  Laterality: Right;  . CYSTOSCOPY/RETROGRADE/URETEROSCOPY/STONE EXTRACTION WITH BASKET Right 06/20/2016   Procedure: CYSTOSCOPY/URETEROSCOPY/STONE EXTRACTION WITH BASKET;  Surgeon: Kathie Rhodes, MD;  Location: [facility];  Service: Urology;  Laterality: Right;  . HOLMIUM LASER APPLICATION Right 16/03/6788   Procedure: HOLMIUM LASER APPLICATION;  Surgeon: Kathie Rhodes, MD;  Location: [facility];  Service: Urology;  Laterality: Right;  . IR GENERIC HISTORICAL  02/11/2016   IR RADIOLOGIST EVAL & MGMT 02/11/2016 [facility]  . IR GENERIC HISTORICAL  02/15/2016   IR BONE TUMOR(S)RF ABLATION 02/15/2016 Luanne Bras, MD [facility]  . IR GENERIC  HISTORICAL  02/15/2016   IR BONE TUMOR(S)RF ABLATION 02/15/2016 Luanne Bras, MD [facility]  . IR GENERIC HISTORICAL  02/15/2016   IR BONE TUMOR(S)RF ABLATION 02/15/2016 Luanne Bras, MD [facility]  . IR GENERIC HISTORICAL  02/15/2016   IR KYPHO THORACIC WITH BONE BIOPSY 02/15/2016 Luanne Bras, MD [facility]  . IR GENERIC HISTORICAL  02/15/2016   IR KYPHO THORACIC WITH BONE BIOPSY 02/15/2016 Luanne Bras, MD [facility]  . IR GENERIC HISTORICAL  02/15/2016   IR VERTEBROPLASTY CERV/THOR BX INC UNI/BIL INC/INJECT/IMAGING 02/15/2016 Luanne Bras, MD [facility]  . IR GENERIC HISTORICAL  03/13/2016   IR KYPHO EA ADDL LEVEL THORACIC OR LUMBAR 03/13/2016 Luanne Bras, MD [facility]  . IR GENERIC HISTORICAL  03/13/2016   IR KYPHO EA ADDL LEVEL THORACIC OR LUMBAR 03/13/2016 Luanne Bras, MD [facility]  . IR GENERIC HISTORICAL  03/13/2016   IR BONE TUMOR(S)RF ABLATION 03/13/2016 Luanne Bras, MD [facility]  . IR GENERIC HISTORICAL  03/13/2016   IR KYPHO LUMBAR INC FX REDUCE BONE BX UNI/BIL CANNULATION INC/IMAGING 03/13/2016 Luanne Bras, MD [facility]  . IR GENERIC HISTORICAL  03/13/2016   IR BONE TUMOR(S)RF ABLATION 03/13/2016 Luanne Bras, MD [facility]  . IR GENERIC HISTORICAL  03/13/2016   IR BONE TUMOR(S)RF ABLATION 03/13/2016 Luanne Bras, MD [facility]  . IR GENERIC HISTORICAL  03/31/2016   IR RADIOLOGIST EVAL & MGMT 03/31/2016 [facility]  . LAPAROSCOPIC INGUINAL HERNIA REPAIR Bilateral 12-16-2013  dr gross  . RADIOLOGY WITH ANESTHESIA N/A 02/15/2016   Procedure: Spinal Ablation;  Surgeon: Luanne Bras, MD;  Location: [facility];  Service: Radiology;  Laterality: N/A;  . RADIOLOGY WITH ANESTHESIA N/A 03/13/2016   Procedure: LUMBER ABLATION;  Surgeon: Luanne Bras, MD;  Location: [facility];  Service: Radiology;  Laterality: N/A;  . ROTATOR CUFF REPAIR Right 2003  . TONSILLECTOMY  age 87  . WISDOM TOOTH EXTRACTION      Social  History  reports that he quit smoking about 37 years ago. His smoking use included cigarettes. He has a 7.00 pack-year smoking history. He has never used smokeless tobacco. He reports that he drinks alcohol. He reports that he does not use drugs.  No Known Allergies  Family History  Problem Relation Age of Onset  . Uterine cancer Mother   . Heart disease Father   . Hypertension Father   . Multiple sclerosis Sister   . Paranoid behavior Brother   . Drug abuse Brother   . Schizophrenia Brother   . Stroke Maternal Grandfather   . Cancer Maternal Aunt   . Leukemia Paternal Aunt   . Healthy Son        x1  . Healthy Daughter        x2  . Allergies Daughter        x1  . Diabetes Neg Hx   . Alzheimer's disease Neg Hx   . Parkinson's disease Neg Hx      Prior to Admission medications   Medication Sig Start Date End Date Taking? Authorizing Provider  amoxicillin (AMOXIL) 500 MG tablet Take one hour before dental procedure Patient taking differently: Take 500 mg by mouth See admin instructions. Take one hour before dental  procedure 09/28/17  Yes Ennever, Rudell Cobb, MD  benzonatate (TESSALON) 200 MG capsule Take 1 capsule (200 mg total) by mouth 3 (three) times daily as needed for cough. 09/09/17  Yes [name], MD  cetirizine (ZYRTEC) 10 MG tablet Take 10 mg by mouth daily as needed for allergies.   Yes [provider]  cholecalciferol (VITAMIN D) 1000 units tablet Take 1,000 Units by mouth daily.   Yes [provider]  famciclovir (FAMVIR) 500 MG tablet Take 1 tablet (500 mg total) by mouth daily. 11/02/17  Yes Ennever, Rudell Cobb, MD  lenalidomide (REVLIMID) 10 MG capsule TAKE 1 CAPSULE BY MOUTH EVERY DAY FOR 21 DAYS, THEN 7 DAYS OFF [id number] 10/23/17  Yes Ennever, Rudell Cobb, MD  lidocaine (LIDODERM) 5 % PLAEC 1 PATCH ONTO THE SKIN DAILY as needed for pain. APPLY 1 PATCH TO THE MOST PAINFUL AREA FOR 12 HR IN A 24 HR PERIOD 12/19/16  Yes [provider]   magnesium oxide (MAG-OX) 400 MG tablet Take 400 mg by mouth daily.   Yes [provider]  ondansetron (ZOFRAN-ODT) 8 MG disintegrating tablet Take 1 tablet (8 mg total) by mouth every 8 (eight) hours as needed. Patient taking differently: Take 8 mg by mouth every 8 (eight) hours as needed for nausea.  09/12/16  Yes [name], MD  OVER THE COUNTER MEDICATION Take 1 tablet by mouth daily.    Yes [provider]  polyethylene glycol powder (MIRALAX) powder Take 17 g by mouth daily. Patient taking differently: Take 17 g by mouth daily as needed for mild constipation.  03/26/16  Yes [name], MD  Probiotic Product (PROBIOTIC PO) Take 1 capsule by mouth daily.   Yes [provider]  rOPINIRole (REQUIP) 0.25 MG tablet TAKE 1 TABLET BY MOUTH 1 TO 3 HOURS BEFORE BED FOR 2 DAYS THEN INCREASE TO 2 TABS Patient taking differently: TAKE 2 TABLETS (0.9m) BY MOUTH 1 TO 3 HOURS BEFORE BED as needed for restless legs. 09/07/17  Yes [place], SHolli Humbles NP  senna (SENOKOT) 8.6 MG TABS tablet Take 2 tablets (17.2 mg total) by mouth daily. Patient taking differently: Take 1 tablet by mouth daily as needed for mild constipation.  05/07/16  Yes [place], SHolli Humbles NP  sertraline (ZOLOFT) 100 MG tablet Take 100 mg by mouth at bedtime.  01/02/16  Yes [provider]  XARELTO 10 MG TABS tablet Take 1 tablet (10 mg total) by mouth daily. Patient taking differently: Take 10 mg by mouth every evening.  11/02/17  Yes Ennever, PRudell Cobb MD  Zoledronic Acid (ZOMETA IV) Inject 4 mg into the vein every 3 (three) months. Receives at Dr. EAntonieta Pertoffice.   Yes [provider]  zolpidem (AMBIEN) 10 MG tablet Take 1 tablet (10 mg total) by mouth at bedtime as needed. Patient taking differently: Take 5 mg by mouth at bedtime as needed for sleep.  06/30/17  Yes EVolanda Napoleon MD    Physical Exam: Vitals:   11/09/17 1100 11/09/17 1115 11/09/17 1130 11/09/17 1259  BP: 125/72  126/87 118/73 127/75  Pulse: 85 85 83 85  Resp: (!) 27 (!) 28 (!) 28 18  Temp:    98.6 F (37 C)  TempSrc:    Oral  SpO2: 97% 98% 97% 95%  Weight:      Height:          Constitutional: Pleasant middle-aged male, moderately built and nourished lying comfortably propped up in bed.  Does not appear in any distress. Eyes: PERTLA, lids and conjunctivae normal ENMT: Mucous membranes mildly dry. Posterior pharynx clear of any exudate or lesions. Normal dentition.  Neck: supple, no masses, no thyromegaly Respiratory: Harsh and slightly diminished breath sounds bilaterally with scattered occasional bilateral expiratory rhonchi but no crackles.  No increased work of breathing. Cardiovascular: S1 & S2 heard, regular rate and rhythm, no murmurs / rubs / gallops. No extremity edema. 2+ pedal pulses. No carotid bruits.  Abdomen: No distension, no tenderness, no masses palpated. No hepatosplenomegaly. Bowel sounds normal.  Musculoskeletal: no clubbing / cyanosis. No joint deformity upper and lower extremities. Good ROM, no contractures. Normal muscle tone.  Skin: no rashes, lesions, ulcers. No induration Neurologic: CN 2-12 grossly intact. Sensation intact, DTR normal. Strength 5/5 in all 4 limbs.  Psychiatric: Normal judgment and insight. Alert and oriented x 3. Normal mood.     Labs on Admission: I have personally reviewed following labs and imaging studies  CBC: Recent Labs  Lab 11/09/17 0827  WBC 3.6*  NEUTROABS 2.6  HGB 12.0*  HCT 36.5*  MCV 95.1  PLT 962*   Basic Metabolic Panel: Recent Labs  Lab 11/09/17 0827  NA 135  K 3.8  CL 101  CO2 23  GLUCOSE 127*  BUN 15  CREATININE 0.92  CALCIUM 8.7*   Liver Function Tests: Recent Labs  Lab 11/09/17 0827  AST 47*  ALT 46  ALKPHOS 60  BILITOT 0.6  PROT 7.1  ALBUMIN 4.4   Coagulation Profile: Recent Labs  Lab 11/09/17 0827  INR 1.24   Urine analysis:    Component Value Date/Time   COLORURINE YELLOW 11/09/2017  1008   APPEARANCEUR CLEAR 11/09/2017 1008   LABSPEC 1.020 11/09/2017 1008   PHURINE 6.0 11/09/2017 1008   GLUCOSEU NEGATIVE 11/09/2017 1008   HGBUR MODERATE (A) 11/09/2017 1008   BILIRUBINUR NEGATIVE 11/09/2017 1008   KETONESUR NEGATIVE 11/09/2017 1008   PROTEINUR NEGATIVE 11/09/2017 1008   NITRITE NEGATIVE 11/09/2017 1008   LEUKOCYTESUR NEGATIVE 11/09/2017 1008     Radiological Exams on Admission: Dg Chest 2 View  Result Date: 11/09/2017 CLINICAL DATA:  Cough, nasal congestion, fever EXAM: CHEST - 2 VIEW COMPARISON:  09/09/2017 FINDINGS: Large hiatal hernia. Stable elevation of the right hemidiaphragm with right base atelectasis. Left lung clear. No effusions. No acute bony abnormality. Multilevel vertebroplasty changes throughout the thoracic and lumbar spine. IMPRESSION: Stable elevation of the right hemidiaphragm with right base atelectasis. Large hiatal hernia. Electronically Signed   By: Rolm Baptise M.D.   On: 11/09/2017 08:41     Assessment/Plan Principal Problem:   Sepsis ([place]) Active Problems:   Multiple myeloma ([place])   HCAP (healthcare-associated pneumonia)   Thrombocytopenia ([place])     1. Sepsis: Suspected due to acute bronchitis versus developing pneumonia.  Treated per sepsis protocol with IV fluids and initiated broad-spectrum IV antibiotics including cefepime and vancomycin.  Flu panel PCR negative.  Follow blood culture results. 2. Acute bronchitis versus suspected HCAP: Initial chest x-ray shows elevated left hemidiaphragm with right base atelectasis but no obvious infiltrate.  Treated per sepsis protocol as above and follow-up chest x-ray 4/30 to look for developing pneumonia.  Treat supportively. 3. Pancytopenia: Likely due to chemo medications.  Follow CBC in a.m. 4. History of DVT: Reportedly completed full dose Xarelto and now is on VTE prophylactic dose Xarelto 10 mg daily.  Continue same. 5. Multiple myeloma: Follows with Dr. Marin Olp, oncology.  Continue  Revlimid. 6. Large hiatal hernia 7.  Anxiety and depression: Stable.  Continue Zoloft. 8. Microscopic hematuria: unclear etiology. Consider repeating UA as OP in 1-2 weeks and if persists may need further evaluation as OP.   DVT prophylaxis: Xarelto Code Status: Full Family Communication: None at bedside Disposition Plan: DC home pending clinical improvement 2-3 days Consults called: None Admission status: Telemetry, inpatient.   Vernell Leep MD [facility] Pager [phone]  If 7PM-7AM, please contact night-coverage [email or web address] Password [facility]  11/09/2017, 6:14 PM

## 2017-11-09 NOTE — Progress Notes (Signed)
Lenalidomide (Revlimid) hold criteria  ANC < 1  Pltc < 50K  AST or ALT > 3x ULN  Bili > 1.5x ULN  Acute venous thromboembolic event  Active infection  Per hospital P&T policy lenalidomide will be held until acute infection resolved.   Thanks! Netta Cedars, PharmD, BCPS 11/09/2017@6 :33 PM

## 2017-11-09 NOTE — ED Provider Notes (Addendum)
[facility] EMERGENCY DEPARTMENT Provider Note   CSN: [id number] Arrival date & time: 11/09/17  0801     History   Chief Complaint Chief Complaint  Patient presents with  . Cough  . Fever    HPI Jorge [name] is a 62 y.o. male.  HPI Patient is a 62 year old male presents to the emergency department with increasing cough congestion and mild shortness of breath on exertion over the past 2 and half days.  This morning he found himself to have a fever of 102 with thus he came to the ER for further evaluation.  He feels like his symptoms are worsening.  He has a history of multiple myeloma.  He is currently being treated with lenalidomide.  No abdominal pain.  No back pain.  No urinary complaints.  Denies nausea vomiting and diarrhea.  Symptoms are moderate in severity.   Past Medical History:  Diagnosis Date  . Anxiety   . Bone metastasis ([place])   . Chest cold 05/19/2016   productive cough  -- started on antibiotic  . Chronic back pain    due to bone mets from myeloma  . Cough   . Depression   . GERD (gastroesophageal reflux disease)   . Hiatal hernia   . History of chicken pox   . History of concussion    age 63 -- no residual  . History of DVT of lower extremity 03/21/2016  treated and completed w/ xarelto   per doppler left extensive occlusion common femoral, femoral, and popliteal veins and right partial occlusion common femoral and profunda femoral veins/  last doppler 06-04-2016 no evidence acute or chronic dvt noted either leg   . History of radiation therapy 06/09/16-06/23/16   lower thoracic spine 25 Gy in 10 fractions  . Mouth ulcers    secondary to radiation  . Multiple myeloma ([place]) dx 02/22/2016 via bone marrow bx---  oncologist-  [name]   IgG Kappa-- Hyperdiploid/ +11 w/ bone mets--  current treatment chemotherapy (started 08/ 2017)and pallitive radiation to back started 06-09-2016  . Renal calculus, right   . Wears contact lenses     Patient  Active Problem List   Diagnosis Date Noted  . Iron deficiency anemia due to chronic blood loss   . Neutropenia ([place]) 03/02/2017  . Cellulitis 03/01/2017  . Phlebitis or thrombophlebitis of lower extremity 07/21/2016  . Visit for preventive health examination 05/16/2016  . Aphthous ulcer 05/16/2016  . Ureterolithiasis 05/16/2016  . Acute deep vein thrombosis (DVT) of femoral vein of left lower extremity ([place]) 03/21/2016  . Multiple myeloma ([place]) 02/22/2016  . Multiple myeloma not having achieved remission ([place]) 02/22/2016  . Bilateral inguinal hernia (BIH), s/p lap repair 12/16/2013 11/02/2013    Past Surgical History:  Procedure Laterality Date  . COLONOSCOPY  [id number]  . CYSTOSCOPY W/ URETERAL STENT PLACEMENT Right 06/20/2016   Procedure: CYSTOSCOPY WITH STENT REPLACEMENT;  Surgeon: Kathie Rhodes, MD;  Location: [facility];  Service: Urology;  Laterality: Right;  . CYSTOSCOPY WITH RETROGRADE PYELOGRAM, URETEROSCOPY AND STENT PLACEMENT Right 05/30/2016   Procedure: CYSTOSCOPY WITH RETROGRADE PYELOGRAM, URETEROSCOPY AND STENT PLACEMENT,DILITATION URETERAL STRICTURE;  Surgeon: Kathie Rhodes, MD;  Location: [facility];  Service: Urology;  Laterality: Right;  . CYSTOSCOPY/RETROGRADE/URETEROSCOPY/STONE EXTRACTION WITH BASKET Right 06/20/2016   Procedure: CYSTOSCOPY/URETEROSCOPY/STONE EXTRACTION WITH BASKET;  Surgeon: Kathie Rhodes, MD;  Location: [facility];  Service: Urology;  Laterality: Right;  . HOLMIUM LASER APPLICATION Right 24/10/100   Procedure: HOLMIUM LASER APPLICATION;  Surgeon: Kathie Rhodes, MD;  Location: [facility];  Service: Urology;  Laterality: Right;  . IR GENERIC HISTORICAL  02/11/2016   IR RADIOLOGIST EVAL & MGMT 02/11/2016 [facility]  . IR GENERIC HISTORICAL  02/15/2016   IR BONE TUMOR(S)RF ABLATION 02/15/2016 Luanne Bras, MD [facility]  . IR GENERIC HISTORICAL  02/15/2016   IR BONE TUMOR(S)RF ABLATION 02/15/2016 Luanne Bras, MD [facility]  . IR GENERIC HISTORICAL  02/15/2016   IR BONE TUMOR(S)RF ABLATION 02/15/2016 Luanne Bras, MD [facility]  . IR GENERIC HISTORICAL  02/15/2016   IR KYPHO THORACIC WITH BONE BIOPSY 02/15/2016 Luanne Bras, MD [facility]  . IR GENERIC HISTORICAL  02/15/2016   IR KYPHO THORACIC WITH BONE BIOPSY 02/15/2016 Luanne Bras, MD [facility]  . IR GENERIC HISTORICAL  02/15/2016   IR VERTEBROPLASTY CERV/THOR BX INC UNI/BIL INC/INJECT/IMAGING 02/15/2016 Luanne Bras, MD [facility]  . IR GENERIC HISTORICAL  03/13/2016   IR KYPHO EA ADDL LEVEL THORACIC OR LUMBAR 03/13/2016 Luanne Bras, MD [facility]  . IR GENERIC HISTORICAL  03/13/2016   IR KYPHO EA ADDL LEVEL THORACIC OR LUMBAR 03/13/2016 Luanne Bras, MD [facility]  . IR GENERIC HISTORICAL  03/13/2016   IR BONE TUMOR(S)RF ABLATION 03/13/2016 Luanne Bras, MD [facility]  . IR GENERIC HISTORICAL  03/13/2016   IR KYPHO LUMBAR INC FX REDUCE BONE BX UNI/BIL CANNULATION INC/IMAGING 03/13/2016 Luanne Bras, MD [facility]  . IR GENERIC HISTORICAL  03/13/2016   IR BONE TUMOR(S)RF ABLATION 03/13/2016 Luanne Bras, MD [facility]  . IR GENERIC HISTORICAL  03/13/2016   IR BONE TUMOR(S)RF ABLATION 03/13/2016 Luanne Bras, MD [facility]  . IR GENERIC HISTORICAL  03/31/2016   IR RADIOLOGIST EVAL & MGMT 03/31/2016 [facility]  . LAPAROSCOPIC INGUINAL HERNIA REPAIR Bilateral 12-16-2013  dr gross  . RADIOLOGY WITH ANESTHESIA N/A 02/15/2016   Procedure: Spinal Ablation;  Surgeon: Luanne Bras, MD;  Location: [facility];  Service: Radiology;  Laterality: N/A;  . RADIOLOGY WITH ANESTHESIA N/A 03/13/2016   Procedure: LUMBER ABLATION;  Surgeon: Luanne Bras, MD;  Location: [facility];  Service: Radiology;  Laterality: N/A;  . ROTATOR CUFF REPAIR Right 2003  . TONSILLECTOMY  age 36  . WISDOM TOOTH EXTRACTION          Home Medications    Prior to Admission medications   Medication Sig Start  Date End Date Taking? Authorizing Provider  amoxicillin (AMOXIL) 500 MG tablet Take one hour before dental procedure 09/28/17   [name], MD  benzonatate (TESSALON) 200 MG capsule Take 1 capsule (200 mg total) by mouth 3 (three) times daily as needed for cough. 09/09/17   [name], MD  Cholecalciferol (VITAMIN D-3) 5000 units TABS Take 5,000 mg by mouth daily.    [provider]  famciclovir (FAMVIR) 500 MG tablet Take 1 tablet (500 mg total) by mouth daily. 11/02/17   [name], MD  ketorolac (TORADOL) 10 MG tablet Take 1 tablet (10 mg total) by mouth every 6 (six) hours as needed. 09/30/17   [name], MD  lenalidomide (REVLIMID) 10 MG capsule TAKE 1 CAPSULE BY MOUTH EVERY DAY FOR 21 DAYS, THEN 7 DAYS OFF [id number] 10/23/17   [name], MD  lidocaine (LIDODERM) 5 % PLAEC 1 PATCH ONTO THE SKIN DAILY.APPLY 1 PATCH TO THE MOST PAINFUL AREA FOR 12 HR IN A 24 HR PERIOD 12/19/16   [provider]  magnesium oxide (MAG-OX) 400 MG tablet Take 400  mg by mouth daily.    [provider]  ondansetron (ZOFRAN-ODT) 8 MG disintegrating tablet Take 1 tablet (8 mg total) by mouth every 8 (eight) hours as needed. 09/12/16   [name], MD  OVER THE COUNTER MEDICATION 29 mg daily.    [provider]  Oxycodone HCl 10 MG TABS Take 1 tablet (10 mg total) by mouth every 4 (four) hours as needed. Patient not taking: Reported on 09/30/2017 05/30/16   Kathie Rhodes, MD  polyethylene glycol powder (MIRALAX) powder Take 17 g by mouth daily. 03/26/16   [name], MD  Probiotic Product (PROBIOTIC PO) Take 1 capsule by mouth daily.    [provider]  rOPINIRole (REQUIP) 0.25 MG tablet TAKE 1 TABLET BY MOUTH 1 TO 3 HOURS BEFORE BED FOR 2 DAYS THEN INCREASE TO 2 TABS 09/07/17   [place], Holli Humbles, NP  senna (SENOKOT) 8.6 MG TABS tablet Take 2 tablets (17.2 mg total) by mouth daily. Patient taking differently: Take 1 tablet by mouth daily.   05/07/16   [place], Holli Humbles, NP  sertraline (ZOLOFT) 100 MG tablet Take 100 mg by mouth at bedtime.  01/02/16   [provider]  XARELTO 10 MG TABS tablet Take 1 tablet (10 mg total) by mouth daily. 11/02/17   [name], MD  zolpidem (AMBIEN) 10 MG tablet Take 1 tablet (10 mg total) by mouth at bedtime as needed. 06/30/17   [name], MD    Family History Family History  Problem Relation Age of Onset  . Uterine cancer Mother   . Heart disease Father   . Hypertension Father   . Multiple sclerosis Sister   . Paranoid behavior Brother   . Drug abuse Brother   . Schizophrenia Brother   . Stroke Maternal Grandfather   . Cancer Maternal Aunt   . Leukemia Paternal Aunt   . Healthy Son        x1  . Healthy Daughter        x2  . Allergies Daughter        x1  . Diabetes Neg Hx   . Alzheimer's disease Neg Hx   . Parkinson's disease Neg Hx     Social History Social History   Tobacco Use  . Smoking status: Former Smoker    Packs/day: 1.00    Years: 7.00    Pack years: 7.00    Types: Cigarettes    Last attempt to quit: 11/02/1980    Years since quitting: 37.0  . Smokeless tobacco: Never Used  Substance Use Topics  . Alcohol use: Yes    Comment: occasional  . Drug use: No     Allergies   Patient has no known allergies.   Review of Systems Review of Systems  All other systems reviewed and are negative.    Physical Exam Updated Vital Signs BP 127/84   Pulse 93   Temp (!) 100.4 F (38 C) (Oral)   Resp (!) 28   Ht 5' 7" (1.702 m)   Wt 77.1 kg (170 lb)   SpO2 96%   BMI 26.63 kg/m   Physical Exam  Constitutional: He is oriented to person, place, and time. He appears well-developed and well-nourished.  HENT:  Head: Normocephalic and atraumatic.  Eyes: EOM are normal.  Neck: Normal range of motion.  Cardiovascular: Normal rate, regular rhythm, normal heart sounds and intact distal pulses.  Pulmonary/Chest: Effort normal.  Rhonchi  bilaterally left greater than right  Abdominal: Soft. He exhibits no distension. There is no tenderness.  Musculoskeletal: Normal range of motion.  Neurological: He is alert and oriented to person, place, and time.  Skin: Skin is warm and dry.  Psychiatric: He has a normal mood and affect. Judgment normal.  Nursing note and vitals reviewed.    ED Treatments / Results  Labs (all labs ordered are listed, but only abnormal results are displayed) Labs Reviewed  COMPREHENSIVE METABOLIC PANEL - Abnormal; Notable for the following components:      Result Value   Glucose, Bld 127 (*)    Calcium 8.7 (*)    AST 47 (*)    All other components within normal limits  CBC WITH DIFFERENTIAL/PLATELET - Abnormal; Notable for the following components:   WBC 3.6 (*)    RBC 3.84 (*)    Hemoglobin 12.0 (*)    HCT 36.5 (*)    Platelets 137 (*)    Lymphs Abs 0.5 (*)    All other components within normal limits  PROTIME-INR - Abnormal; Notable for the following components:   Prothrombin Time 15.5 (*)    All other components within normal limits  CULTURE, BLOOD (ROUTINE X 2)  CULTURE, BLOOD (ROUTINE X 2)  URINALYSIS, ROUTINE W REFLEX MICROSCOPIC  I-STAT CG4 LACTIC ACID, ED  I-STAT CG4 LACTIC ACID, ED    EKG None  Radiology Dg Chest 2 View  Result Date: 11/09/2017 CLINICAL DATA:  Cough, nasal congestion, fever EXAM: CHEST - 2 VIEW COMPARISON:  09/09/2017 FINDINGS: Large hiatal hernia. Stable elevation of the right hemidiaphragm with right base atelectasis. Left lung clear. No effusions. No acute bony abnormality. Multilevel vertebroplasty changes throughout the thoracic and lumbar spine. IMPRESSION: Stable elevation of the right hemidiaphragm with right base atelectasis. Large hiatal hernia. Electronically Signed   By: Rolm Baptise M.D.   On: 11/09/2017 08:41    Procedures .Critical Care Performed by: Jola Schmidt, MD Authorized by: Jola Schmidt, MD     CRITICAL CARE Performed by: Jola Schmidt Total critical care time: 31 minutes Critical care time was exclusive of separately billable procedures and treating other patients. Critical care was necessary to treat or prevent imminent or life-threatening deterioration. Critical care was time spent personally by me on the following activities: development of treatment plan with patient and/or surrogate as well as nursing, discussions with consultants, evaluation of patient's response to treatment, examination of patient, obtaining history from patient or surrogate, ordering and performing treatments and interventions, ordering and review of laboratory studies, ordering and review of radiographic studies, pulse oximetry and re-evaluation of patient's condition.   Medications Ordered in ED Medications  ceFEPIme (MAXIPIME) 1 g in sodium chloride 0.9 % 100 mL IVPB (has no administration in time range)  vancomycin (VANCOCIN) IVPB 1000 mg/200 mL premix (1,000 mg Intravenous New Bag/Given 11/09/17 0934)  acetaminophen (TYLENOL) tablet 1,000 mg (1,000 mg Oral Given 11/09/17 0848)  ceFEPIme (MAXIPIME) 1 g in sodium chloride 0.9 % 100 mL IVPB (1 g Intravenous New Bag/Given 11/09/17 0933)  ceFEPIme (MAXIPIME) 1 g injection (  Return to [facility] 11/09/17 0934)     Initial Impression / Assessment and Plan / ED Course  I have reviewed the triage vital signs and the nursing notes.  Pertinent labs & imaging results that were available during my care of the patient were reviewed by me and considered in my medical decision making (see chart for details).     Clinically concerning for pneumonia in the setting of chemotherapy.  O2  sats are 91%.  Exertional shortness of breath with productive cough.  Broad-spectrum antibiotics now.  Given his high fever the patient be observed in the hospital overnight and will need repeat chest x-ray.  Blood cultures pending.  Symptoms have been progressing over the past 2-1/2 days and continued to worsen today.  Final  Clinical Impressions(s) / ED Diagnoses   Final diagnoses:  Fever, unspecified fever cause  HCAP (healthcare-associated pneumonia)  Multiple myeloma, remission status unspecified [facility])    ED Discharge Orders    None       Jola Schmidt, MD 11/09/17 [place], Kevin, MD 11/17/17 1049

## 2017-11-10 ENCOUNTER — Inpatient Hospital Stay (HOSPITAL_COMMUNITY): Payer: 59

## 2017-11-10 ENCOUNTER — Encounter (HOSPITAL_COMMUNITY): Payer: Self-pay | Admitting: Radiology

## 2017-11-10 DIAGNOSIS — Z86718 Personal history of other venous thrombosis and embolism: Secondary | ICD-10-CM

## 2017-11-10 DIAGNOSIS — R05 Cough: Secondary | ICD-10-CM

## 2017-11-10 DIAGNOSIS — F329 Major depressive disorder, single episode, unspecified: Secondary | ICD-10-CM

## 2017-11-10 DIAGNOSIS — Z7901 Long term (current) use of anticoagulants: Secondary | ICD-10-CM

## 2017-11-10 DIAGNOSIS — K219 Gastro-esophageal reflux disease without esophagitis: Secondary | ICD-10-CM

## 2017-11-10 DIAGNOSIS — Z87891 Personal history of nicotine dependence: Secondary | ICD-10-CM

## 2017-11-10 DIAGNOSIS — R7881 Bacteremia: Secondary | ICD-10-CM | POA: Diagnosis present

## 2017-11-10 DIAGNOSIS — F419 Anxiety disorder, unspecified: Secondary | ICD-10-CM

## 2017-11-10 DIAGNOSIS — M545 Low back pain: Secondary | ICD-10-CM

## 2017-11-10 DIAGNOSIS — C9001 Multiple myeloma in remission: Secondary | ICD-10-CM

## 2017-11-10 DIAGNOSIS — Z9484 Stem cells transplant status: Secondary | ICD-10-CM

## 2017-11-10 DIAGNOSIS — K449 Diaphragmatic hernia without obstruction or gangrene: Secondary | ICD-10-CM

## 2017-11-10 DIAGNOSIS — Z809 Family history of malignant neoplasm, unspecified: Secondary | ICD-10-CM

## 2017-11-10 DIAGNOSIS — R509 Fever, unspecified: Secondary | ICD-10-CM

## 2017-11-10 LAB — BLOOD CULTURE ID PANEL (REFLEXED)
Acinetobacter baumannii: NOT DETECTED
CANDIDA ALBICANS: NOT DETECTED
CANDIDA PARAPSILOSIS: NOT DETECTED
Candida glabrata: NOT DETECTED
Candida krusei: NOT DETECTED
Candida tropicalis: NOT DETECTED
Carbapenem resistance: NOT DETECTED
ENTEROBACTER CLOACAE COMPLEX: NOT DETECTED
ENTEROBACTERIACEAE SPECIES: NOT DETECTED
ENTEROCOCCUS SPECIES: DETECTED — AB
ESCHERICHIA COLI: NOT DETECTED
Haemophilus influenzae: NOT DETECTED
KLEBSIELLA PNEUMONIAE: NOT DETECTED
Klebsiella oxytoca: NOT DETECTED
Listeria monocytogenes: NOT DETECTED
Methicillin resistance: NOT DETECTED
Neisseria meningitidis: NOT DETECTED
PSEUDOMONAS AERUGINOSA: NOT DETECTED
Proteus species: NOT DETECTED
STAPHYLOCOCCUS AUREUS BCID: NOT DETECTED
STREPTOCOCCUS AGALACTIAE: NOT DETECTED
STREPTOCOCCUS PNEUMONIAE: NOT DETECTED
Serratia marcescens: NOT DETECTED
Staphylococcus species: NOT DETECTED
Streptococcus pyogenes: NOT DETECTED
Streptococcus species: NOT DETECTED
VANCOMYCIN RESISTANCE: NOT DETECTED

## 2017-11-10 LAB — CBC
HEMATOCRIT: 33.3 % — AB (ref 39.0–52.0)
HEMOGLOBIN: 10.3 g/dL — AB (ref 13.0–17.0)
MCH: 30.6 pg (ref 26.0–34.0)
MCHC: 30.9 g/dL (ref 30.0–36.0)
MCV: 98.8 fL (ref 78.0–100.0)
Platelets: 96 10*3/uL — ABNORMAL LOW (ref 150–400)
RBC: 3.37 MIL/uL — AB (ref 4.22–5.81)
RDW: 14.5 % (ref 11.5–15.5)
WBC: 1.7 10*3/uL — ABNORMAL LOW (ref 4.0–10.5)

## 2017-11-10 LAB — BASIC METABOLIC PANEL
ANION GAP: 8 (ref 5–15)
BUN: 13 mg/dL (ref 6–20)
CALCIUM: 8 mg/dL — AB (ref 8.9–10.3)
CHLORIDE: 107 mmol/L (ref 101–111)
CO2: 25 mmol/L (ref 22–32)
Creatinine, Ser: 1 mg/dL (ref 0.61–1.24)
GFR calc non Af Amer: >60 mL/min (ref >60)
Glucose, Bld: 110 mg/dL — ABNORMAL HIGH (ref 65–99)
POTASSIUM: 4 mmol/L (ref 3.5–5.1)
Sodium: 140 mmol/L (ref 135–145)

## 2017-11-10 LAB — C DIFFICILE QUICK SCREEN W PCR REFLEX
C DIFFICLE (CDIFF) ANTIGEN: NEGATIVE
C Diff interpretation: NOT DETECTED
C Diff toxin: NEGATIVE

## 2017-11-10 MED ORDER — IOPAMIDOL (ISOVUE-370) INJECTION 76%
100.0000 mL | Freq: Once | INTRAVENOUS | Status: AC | PRN
  Administered 2017-11-10: 100 mL via INTRAVENOUS

## 2017-11-10 MED ORDER — FAMOTIDINE 20 MG PO TABS
20.0000 mg | ORAL_TABLET | Freq: Every day | ORAL | Status: DC
  Administered 2017-11-10 – 2017-11-12 (×3): 20 mg via ORAL
  Filled 2017-11-10 (×3): qty 1

## 2017-11-10 MED ORDER — IOPAMIDOL (ISOVUE-370) INJECTION 76%
INTRAVENOUS | Status: AC
  Filled 2017-11-10: qty 100

## 2017-11-10 MED ORDER — KETOROLAC TROMETHAMINE 15 MG/ML IJ SOLN
15.0000 mg | Freq: Four times a day (QID) | INTRAMUSCULAR | Status: DC | PRN
Start: 2017-11-10 — End: 2017-11-13
  Administered 2017-11-10 – 2017-11-12 (×2): 15 mg via INTRAVENOUS
  Filled 2017-11-10 (×5): qty 1

## 2017-11-10 NOTE — Progress Notes (Signed)
PHARMACY - PHYSICIAN COMMUNICATION CRITICAL VALUE ALERT - BLOOD CULTURE IDENTIFICATION (BCID)  Jorge [name] is an 62 y.o. male with multiple myeloma s/p on Revlimid PTA, presented to [facility] on 11/09/2017 with a chief complaint of cough, running nose and fever.  Vancomycin and cefepime started on admission for PNA.  Name of physician (or Provider) Contacted: paged Dr. Tana Coast but did not receive a call back.  Current antibiotics: vanomycin and cefepime  Changes to prescribed antibiotics recommended: Dr. Linus Salmons is aware of culture results. Will f/u with his recommendations for abx. Cont current abx for now.   Results for orders placed or performed during the hospital encounter of 11/09/17  Blood Culture ID Panel (Reflexed) (Collected: 11/09/2017  8:40 AM)  Result Value Ref Range   Enterococcus species DETECTED (A) NOT DETECTED   Vancomycin resistance NOT DETECTED NOT DETECTED   Listeria monocytogenes NOT DETECTED NOT DETECTED   Staphylococcus species NOT DETECTED NOT DETECTED   Staphylococcus aureus NOT DETECTED NOT DETECTED   Methicillin resistance NOT DETECTED NOT DETECTED   Streptococcus species NOT DETECTED NOT DETECTED   Streptococcus agalactiae NOT DETECTED NOT DETECTED   Streptococcus pneumoniae NOT DETECTED NOT DETECTED   Streptococcus pyogenes NOT DETECTED NOT DETECTED   Acinetobacter baumannii NOT DETECTED NOT DETECTED   Enterobacteriaceae species NOT DETECTED NOT DETECTED   Enterobacter cloacae complex NOT DETECTED NOT DETECTED   Escherichia coli NOT DETECTED NOT DETECTED   Klebsiella oxytoca NOT DETECTED NOT DETECTED   Klebsiella pneumoniae NOT DETECTED NOT DETECTED   Proteus species NOT DETECTED NOT DETECTED   Serratia marcescens NOT DETECTED NOT DETECTED   Carbapenem resistance NOT DETECTED NOT DETECTED   Haemophilus influenzae NOT DETECTED NOT DETECTED   Neisseria meningitidis NOT DETECTED NOT DETECTED   Pseudomonas aeruginosa NOT DETECTED NOT DETECTED   Candida  albicans NOT DETECTED NOT DETECTED   Candida glabrata NOT DETECTED NOT DETECTED   Candida krusei NOT DETECTED NOT DETECTED   Candida parapsilosis NOT DETECTED NOT DETECTED   Candida tropicalis NOT DETECTED NOT DETECTED    Jorge [name] P 11/10/2017  11:07 AM

## 2017-11-10 NOTE — Progress Notes (Signed)
[facility]                                                                              Patient Demographics  Jorge [name], is a 62 y.o. male, DOB - September 15, 1955, VHQ:[id number]  Admit date - 11/09/2017   Admitting Physician Modena Jansky, MD  Outpatient Primary MD for the patient is Saguier, Iris Pert  Outpatient specialists:   LOS - 1  days   Medical records reviewed and are as summarized below:    Chief Complaint  Patient presents with  . Cough  . Fever       Brief summary  Patient is a 62 year old male with multiple myeloma on Revlimid, bony metastasis, chronic back pain related to same, history of DVT, completed a full dose anticoagulation and currently is on prophylactic dose Xarelto for the last year, anxiety, depression, GERD, presented to [place] with fevers 102.8 F, dyspnea, wheezing, coughing. Chest x-ray showed right hemidiaphragm elevation with right base atelectasis and large hiatal hernia.  UA negative for UTI.  Patient was admitted for sepsis for suspected developing pneumonia.  Assessment & Plan    Principal Problem:   Sepsis ([place]) with enterococcal bacteremia -he met sepsis criteria at the time of admission with fevers, dyspnea, leukocytosis, likely due to developing pneumonia. BCID growing enterococcus - ID consulted, follow 2D echo -For now we will continue IV vancomycin and cefepime, follow blood cultures and sensitivities -Continue IV fluid hydration   Active Problems: Metastatic multiple myeloma (HCC) -Seen by [name], Revlimid currently on hold due to acute sepsis and pneumonia    HCAP (healthcare-associated pneumonia) bilateral lower lobe -Continue IV vancomycin and cefepime -CT chest showed no PE, pneumonia in each lower lobe, focal infiltrates apical segment right upper lobe.  Extensive bony changes of multiple myeloma    Thrombocytopenia (HCC), pancytopenia -Likely due to chemo medications, follow  counts closely  History of DVT -Completed full therapeutic dose of Xarelto, continue prophylactic Xarelto  Anxiety, depression -Continue Zoloft   Code Status: Full code* DVT Prophylaxis: Xarelto Family Communication: Discussed in detail with the patient, all imaging results, lab results explained to the patient    Disposition Plan:  Time Spent in minutes   35 minutes  Procedures:  CTA chest   Consultants:   ID oncology  Antimicrobials:   IV vancomycin 4/29  IV cefepime 4/29   Medications  Scheduled Meds: . acidophilus  1 capsule Oral Daily  . albuterol  2.5 mg Nebulization TID  . benzonatate  200 mg Oral TID  . cholecalciferol  1,000 Units Oral Daily  . famciclovir  500 mg Oral Daily  . iopamidol      . magnesium oxide  400 mg Oral Daily  . rivaroxaban  10 mg Oral QPM  . sertraline  100 mg Oral QHS  . sodium chloride flush  3 mL Intravenous Q12H   Continuous Infusions: . ceFEPime (MAXIPIME) IV Stopped (11/10/17 0846)  . vancomycin 1,000 mg (11/10/17 1104)   PRN Meds:.acetaminophen **OR** acetaminophen, albuterol, guaiFENesin-dextromethorphan, ibuprofen, loratadine, ondansetron **OR** ondansetron (ZOFRAN) IV, polyethylene glycol, zolpidem   Antibiotics   Anti-infectives (From admission,  onward)   Start     Dose/Rate Route Frequency Ordered Stop   11/09/17 2000  famciclovir [facility]) tablet 500 mg     500 mg Oral Daily 11/09/17 1814     11/09/17 1700  ceFEPIme (MAXIPIME) 1 g in sodium chloride 0.9 % 100 mL IVPB     1 g 200 mL/hr over 30 Minutes Intravenous Every 8 hours 11/09/17 0900     11/09/17 1000  vancomycin (VANCOCIN) IVPB 1000 mg/200 mL premix     1,000 mg 200 mL/hr over 60 Minutes Intravenous Every 12 hours 11/09/17 0900     11/09/17 0926  ceFEPIme (MAXIPIME) 1 g injection    Note to Pharmacy:  Lurene Shadow   : cabinet override      11/09/17 0926 11/09/17 0934   11/09/17 0900  ceFEPIme (MAXIPIME) 1 g in sodium chloride 0.9 % 100 mL IVPB     1  g 200 mL/hr over 30 Minutes Intravenous  Once 11/09/17 0855 11/09/17 1134        Subjective:   Malikah Lakey was seen and examined today.  Feels slightly better, cough getting better.  Patient denies dizziness, chest pain, abdominal pain, N/V/D/C, new weakness, numbess, tingling. No acute events overnight.    Objective:   Vitals:   11/10/17 0145 11/10/17 0400 11/10/17 0618 11/10/17 0935  BP:   112/65   Pulse:   80 94  Resp: (!) _0 Temp: 100 F (37.8 C) (!) 100.7 F (38.2 C) 99.1 F (37.3 C)   TempSrc: Oral Oral    SpO2: 95%  98% 94%  Weight:      Height:        Intake/Output Summary (Last 24 hours) at 11/10/2017 1158 Last data filed at 11/10/2017 1104 Gross per 24 hour  Intake 420 ml  Output 703 ml  Net -283 ml     Wt Readings from Last 3 Encounters:  11/09/17 77.1 kg (170 lb)  09/30/17 77.6 kg (171 lb)  08/18/17 76.7 kg (169 lb)     Exam  General: Alert and oriented x 3, NAD  Eyes:   HEENT:    Cardiovascular: S1 S2 auscultated,  Regular rate and rhythm.  Respiratory: Diminished breath sounds bilaterally with scattered wheezing  Gastrointestinal: Soft, nontender, nondistended, + bowel sounds  Ext: no pedal edema bilaterally  Neuro: no new deficits  Musculoskeletal: No digital cyanosis, clubbing  Skin: No rashes  Psych: Normal affect and demeanor, alert and oriented x3    Data Reviewed:  I have personally reviewed following labs and imaging studies  Micro Results Recent Results (from the past 240 hour(s))  Culture, blood (Routine x 2)     Status: None (Preliminary result)   Collection Time: 11/09/17  8:20 AM  Result Value Ref Range Status   Specimen Description   Final    BLOOD RIGHT ARM Performed at [facility], [address] [place], [place] [address]    Special Requests   Final    BOTTLES DRAWN AEROBIC AND ANAEROBIC Blood Culture adequate volume Performed at [facility], [address] [place], [place] [address]    Culture   Final    NO GROWTH < 24 HOURS Performed at [facility], [address], [place], [place] [address]    Report Status PENDING  Incomplete  Culture, blood (Routine x 2)     Status: None (Preliminary result)   Collection Time: 11/09/17  8:40 AM  Result Value Ref Range Status   Specimen Description   Final    BLOOD RIGHT HAND Performed at [facility], [address] [place], [place] [address]    Special Requests   Final    BOTTLES DRAWN AEROBIC AND ANAEROBIC Blood Culture adequate volume Performed at [facility], [address] [place], [place] [address]    Culture  Setup Time   Final    ANAEROBIC BOTTLE ONLY Organism ID to follow GRAM POSITIVE COCCI IN PAIRS IN CHAINS CRITICAL RESULT CALLED TO, READ BACK BY AND VERIFIED WITH: D. Wofford PharmD 10:10 11/10/17 ([email or web address])    Culture   Final    NO GROWTH < 24 HOURS Performed at [facility], [place] [address], [place], [place] [address]    Report Status PENDING  Incomplete  Blood Culture ID Panel (Reflexed)     Status: Abnormal   Collection Time: 11/09/17  8:40 AM  Result Value Ref Range Status   Enterococcus species DETECTED (A) NOT DETECTED Final    Comment: CRITICAL RESULT CALLED TO, READ BACK BY AND VERIFIED WITH: D. Wofford PharmD 10:10 11/10/17 ([email or web address])    Vancomycin resistance NOT DETECTED NOT DETECTED Final   Listeria monocytogenes NOT DETECTED NOT DETECTED Final   Staphylococcus species NOT DETECTED NOT DETECTED Final   Staphylococcus aureus NOT DETECTED NOT DETECTED Final   Methicillin resistance NOT DETECTED NOT DETECTED Final   Streptococcus species NOT DETECTED NOT DETECTED Final   Streptococcus agalactiae NOT DETECTED NOT DETECTED Final   Streptococcus pneumoniae NOT DETECTED NOT DETECTED Final   Streptococcus pyogenes NOT DETECTED NOT DETECTED Final   Acinetobacter baumannii NOT DETECTED NOT DETECTED Final   Enterobacteriaceae species NOT DETECTED NOT  DETECTED Final   Enterobacter cloacae complex NOT DETECTED NOT DETECTED Final   Escherichia coli NOT DETECTED NOT DETECTED Final   Klebsiella oxytoca NOT DETECTED NOT DETECTED Final   Klebsiella pneumoniae NOT DETECTED NOT DETECTED Final   Proteus species NOT DETECTED NOT DETECTED Final   Serratia marcescens NOT DETECTED NOT DETECTED Final   Carbapenem resistance NOT DETECTED NOT DETECTED Final   Haemophilus influenzae NOT DETECTED NOT DETECTED Final   Neisseria meningitidis NOT DETECTED NOT DETECTED Final   Pseudomonas aeruginosa NOT DETECTED NOT DETECTED Final   Candida albicans NOT DETECTED NOT DETECTED Final   Candida glabrata NOT DETECTED NOT DETECTED Final   Candida krusei NOT DETECTED NOT DETECTED Final   Candida parapsilosis NOT DETECTED NOT DETECTED Final   Candida tropicalis NOT DETECTED NOT DETECTED Final    Radiology Reports X-ray Chest Pa And Lateral  Result Date: 11/10/2017 CLINICAL DATA:  Cough, chest congestion, and shortness of breath which have worsened over the past 3 days. EXAM: CHEST - 2 VIEW COMPARISON:  Chest x-ray of April 29th 2019 and chest CT scan of today's date FINDINGS: The right lung is mildly hypoinflated. The hemidiaphragm is elevated with respect to the left. There is bibasilar interstitial density. There is no large pleural effusion. The heart is normal in size. The pulmonary vascularity is not engorged. IMPRESSION: Bibasilar atelectasis or pneumonia. Elevation of the right hemidiaphragm. Electronically Signed   By: David  Martinique M.D.   On: 11/10/2017 10:06   Dg Chest 2 View  Result Date: 11/09/2017 CLINICAL DATA:  Cough, nasal congestion, fever EXAM: CHEST - 2 VIEW COMPARISON:  09/09/2017 FINDINGS: Large hiatal hernia. Stable elevation of the right hemidiaphragm with right base atelectasis. Left lung clear. No effusions. No acute bony abnormality.  Multilevel vertebroplasty changes throughout the thoracic and lumbar spine. IMPRESSION: Stable elevation of  the right hemidiaphragm with right base atelectasis. Large hiatal hernia. Electronically Signed   By: Rolm Baptise M.D.   On: 11/09/2017 08:41   Ct Angio Chest Pe W Or Wo Contrast  Result Date: 11/10/2017 CLINICAL DATA:  Shortness of breath.  History of multiple myeloma EXAM: CT ANGIOGRAPHY CHEST WITH CONTRAST TECHNIQUE: Multidetector CT imaging of the chest was performed using the standard protocol during bolus administration of intravenous contrast. Multiplanar CT image reconstructions and MIPs were obtained to evaluate the vascular anatomy. CONTRAST:  194m ISOVUE-370 IOPAMIDOL (ISOVUE-370) INJECTION 76% COMPARISON:  Chest radiograph November 09, 2017; thoracic MRI July 30, 2016 FINDINGS: Cardiovascular: There is no demonstrable pulmonary embolus. There is no thoracic aortic aneurysm or dissection. The visualized great vessels appear normal. There is no appreciable pericardial effusion or pericardial thickening evident. Mediastinum/Nodes: Thyroid appears unremarkable. There is no appreciable thoracic adenopathy. There is a sizable hiatal type hernia. Lungs/Pleura: There is patchy consolidation with associated atelectasis in the right lower lobe, primarily in the posterior and to a lesser extent medial segments of the right lower lobe. There is patchy atelectasis with probable consolidation in a portion of the posterior segment of the left lower lobe. There is airspace consolidation in the apical segment of the right upper lobe. There is no parenchymal lung mass or nodular lesion. No appreciable pleural effusion or pleural thickening. Upper Abdomen: There is diffuse hepatic steatosis. There is reflux of contrast into the inferior vena cava and portal veins. Visualized upper abdominal structures otherwise appear unremarkable. Musculoskeletal: There is elevation of the right hemidiaphragm. There are lytic bone lesions throughout essentially all visualized vertebral bodies. The patient is undergone multiple  kyphoplasty procedures. There is marked collapse of the T7 and T9 vertebral bodies with mild retropulsion of bone into the canal at the T9 level causing moderate spinal stenosis. Mild anterior wedging at T5 is stable compared to prior MR. Other areas of fracture have undergone kyphoplasty type procedures. There are small lytic lesions in each scapula. Review of the MIP images confirms the above findings. IMPRESSION: 1. No demonstrable pulmonary embolus. Thoracic aortic aneurysm or dissection. 2. Areas of atelectasis with associated consolidation felt to represent pneumonia in each lower lobe. Focal infiltrate apical segment right upper lobe. 3.  No appreciable adenopathy. 4.  Stable fairly sizable hiatal hernia. 5. Extensive bony changes of multiple myeloma. Patient has undergone multiple kyphoplasty type procedures in the thoracic and lumbar regions. Note that there is retropulsion of bone at T9 causing moderate spinal stenosis, a finding also present on prior thoracic MR from January 2018 and unchanged. 6.  Hepatic steatosis. 7.  Stable elevation of the right hemidiaphragm. Electronically Signed   By: WLowella GripIII M.D.   On: 11/10/2017 09:15    Lab Data:  CBC: Recent Labs  Lab 11/09/17 0827 11/10/17 0703  WBC 3.6* 1.7*  NEUTROABS 2.6  --   HGB 12.0* 10.3*  HCT 36.5* 33.3*  MCV 95.1 98.8  PLT 137* 96*   Basic Metabolic Panel: Recent Labs  Lab 11/09/17 0827 11/10/17 0703  NA 135 140  K 3.8 4.0  CL 101 107  CO2 23 25  GLUCOSE 127* 110*  BUN 15 13  CREATININE 0.92 1.00  CALCIUM 8.7* 8.0*   GFR: Estimated Creatinine Clearance: 72.5 mL/min (by C-G formula based on SCr of 1 mg/dL). Liver Function Tests: Recent Labs  Lab 11/09/17 0827  AST 47*  ALT  46  ALKPHOS 60  BILITOT 0.6  PROT 7.1  ALBUMIN 4.4   No results for input(s): LIPASE, AMYLASE in the last 168 hours. No results for input(s): AMMONIA in the last 168 hours. Coagulation Profile: Recent Labs  Lab  11/09/17 0827  INR 1.24   Cardiac Enzymes: No results for input(s): CKTOTAL, CKMB, CKMBINDEX, TROPONINI in the last 168 hours. BNP (last 3 results) No results for input(s): PROBNP in the last 8760 hours. HbA1C: No results for input(s): HGBA1C in the last 72 hours. CBG: No results for input(s): GLUCAP in the last 168 hours. Lipid Profile: No results for input(s): CHOL, HDL, LDLCALC, TRIG, CHOLHDL, LDLDIRECT in the last 72 hours. Thyroid Function Tests: No results for input(s): TSH, T4TOTAL, FREET4, T3FREE, THYROIDAB in the last 72 hours. Anemia Panel: No results for input(s): VITAMINB12, FOLATE, FERRITIN, TIBC, IRON, RETICCTPCT in the last 72 hours. Urine analysis:    Component Value Date/Time   COLORURINE YELLOW 11/09/2017 1008   APPEARANCEUR CLEAR 11/09/2017 1008   LABSPEC 1.020 11/09/2017 1008   PHURINE 6.0 11/09/2017 1008   GLUCOSEU NEGATIVE 11/09/2017 1008   HGBUR MODERATE (A) 11/09/2017 1008   BILIRUBINUR NEGATIVE 11/09/2017 1008   KETONESUR NEGATIVE 11/09/2017 1008   PROTEINUR NEGATIVE 11/09/2017 1008   NITRITE NEGATIVE 11/09/2017 1008   LEUKOCYTESUR NEGATIVE 11/09/2017 1008     Grayson Pfefferle M.D. [facility] 11/10/2017, 11:58 AM  Pager: [phone] Between 7am to 7pm - call Pager - [phone]  After 7pm go to [email or web address] - password [email or web address]  Call night coverage person covering after 7pm

## 2017-11-10 NOTE — Progress Notes (Signed)
Patient instructed on the use of flutter device. Patient demonstrated understanding and performed with good effort.

## 2017-11-10 NOTE — Consult Note (Signed)
Referral MD  Reason for Referral: Fever, cough; history of myeloma-IgG kappa-status post stem cell transplant at [facility] in February 2018  Chief Complaint  Patient presents with  . Cough  . Fever  : I had a cough and fever of 103 on Sunday.  HPI: Jorge [name] is well-known to me.  He is a very nice 62 year old white male.  He had a diagnosis of IgG kappa myeloma.  He initially underwent chemotherapy with RVD.  He had a very good response.  He then went to [facility] in February 2018 and had an autologous stem cell transplant.  He currently is on Revlimid (10 mg p.o. daily-21 days on and 7 days off).  He has been in a very good remission.  His last myeloma studies back in March showed an M spike of 0.2 g/dL.  His IgG level was 739 mg/dL.  Of note, he does have a history of lower extremity DVT that is recurrent.  He is on long-term Xarelto as maintenance therapy (10 mg p.o. daily).  He was in [facility] recently.  I think her friend got married.  He did well out there.  No one was sick that he was around.  Over the past weekend, began to have a cough.  He thought it was pollen.  However it worsened.  He really was not coughing of all that much.  He then had a temperature of 103 degrees.  He went to the emergency room and was subsequently admitted.  He was admitted on 29 April.  On admission, his labs showed a white cell count of 3.6.  Hemoglobin 12.  Hematocrit 36.5.  Blood count 137,000.  His electrolytes looked okay.  His creatinine was 0.92.  His calcium was 8.7.  His liver function test showed his mildly elevated SGOT of 47.  He had a chest x-ray on admission.  This showed stable elevation of the right hemidiaphragm with right base atelectasis.  He was started on antibiotics.  Cultures are negative so far.  He tested negative for influenza A and influenza B.  He has had no diarrhea.  Has had no dysuria.  There is been no rashes.  He has had no weight loss or weight gain.  He has had no mouth  sores.  There is been no headache.  He has been very active.  He does have a lot of back issues because of myeloma involvement of his back.  We are asked to see him to help with management of his fever.  He does sound congested.  It sounds like he is coughing up a little bit of mucus.  He is on IV antibiotics with Maxipime and vancomycin.  He is also getting albuterol nebulizers.  He looks quite good.  He says he is feeling better.  Overall, his performance status is ECOG 1.    Past Medical History:  Diagnosis Date  . Anxiety   . Bone metastasis ([place])   . Chest cold 05/19/2016   productive cough  -- started on antibiotic  . Chronic back pain    due to bone mets from myeloma  . Cough   . Depression   . GERD (gastroesophageal reflux disease)   . Hiatal hernia   . History of chicken pox   . History of concussion    age 18 -- no residual  . History of DVT of lower extremity 03/21/2016  treated and completed w/ xarelto   per doppler left extensive occlusion common femoral, femoral, and  popliteal veins and right partial occlusion common femoral and profunda femoral veins/  last doppler 06-04-2016 no evidence acute or chronic dvt noted either leg   . History of radiation therapy 06/09/16-06/23/16   lower thoracic spine 25 Gy in 10 fractions  . Mouth ulcers    secondary to radiation  . Multiple myeloma ([place]) dx 02/22/2016 via bone marrow bx---  oncologist-  [name]   IgG Kappa-- Hyperdiploid/ +11 w/ bone mets--  current treatment chemotherapy (started 08/ 2017)and pallitive radiation to back started 06-09-2016  . Renal calculus, right   . Wears contact lenses   :  Past Surgical History:  Procedure Laterality Date  . COLONOSCOPY  [id number]  . CYSTOSCOPY W/ URETERAL STENT PLACEMENT Right 06/20/2016   Procedure: CYSTOSCOPY WITH STENT REPLACEMENT;  Surgeon: Kathie Rhodes, MD;  Location: [facility];  Service: Urology;  Laterality: Right;  . CYSTOSCOPY WITH RETROGRADE  PYELOGRAM, URETEROSCOPY AND STENT PLACEMENT Right 05/30/2016   Procedure: CYSTOSCOPY WITH RETROGRADE PYELOGRAM, URETEROSCOPY AND STENT PLACEMENT,DILITATION URETERAL STRICTURE;  Surgeon: Kathie Rhodes, MD;  Location: [facility];  Service: Urology;  Laterality: Right;  . CYSTOSCOPY/RETROGRADE/URETEROSCOPY/STONE EXTRACTION WITH BASKET Right 06/20/2016   Procedure: CYSTOSCOPY/URETEROSCOPY/STONE EXTRACTION WITH BASKET;  Surgeon: Kathie Rhodes, MD;  Location: [facility];  Service: Urology;  Laterality: Right;  . HOLMIUM LASER APPLICATION Right 15/02/3093   Procedure: HOLMIUM LASER APPLICATION;  Surgeon: Kathie Rhodes, MD;  Location: [facility];  Service: Urology;  Laterality: Right;  . IR GENERIC HISTORICAL  02/11/2016   IR RADIOLOGIST EVAL & MGMT 02/11/2016 [facility]  . IR GENERIC HISTORICAL  02/15/2016   IR BONE TUMOR(S)RF ABLATION 02/15/2016 Luanne Bras, MD [facility]  . IR GENERIC HISTORICAL  02/15/2016   IR BONE TUMOR(S)RF ABLATION 02/15/2016 Luanne Bras, MD [facility]  . IR GENERIC HISTORICAL  02/15/2016   IR BONE TUMOR(S)RF ABLATION 02/15/2016 Luanne Bras, MD [facility]  . IR GENERIC HISTORICAL  02/15/2016   IR KYPHO THORACIC WITH BONE BIOPSY 02/15/2016 Luanne Bras, MD [facility]  . IR GENERIC HISTORICAL  02/15/2016   IR KYPHO THORACIC WITH BONE BIOPSY 02/15/2016 Luanne Bras, MD [facility]  . IR GENERIC HISTORICAL  02/15/2016   IR VERTEBROPLASTY CERV/THOR BX INC UNI/BIL INC/INJECT/IMAGING 02/15/2016 Luanne Bras, MD [facility]  . IR GENERIC HISTORICAL  03/13/2016   IR KYPHO EA ADDL LEVEL THORACIC OR LUMBAR 03/13/2016 Luanne Bras, MD [facility]  . IR GENERIC HISTORICAL  03/13/2016   IR KYPHO EA ADDL LEVEL THORACIC OR LUMBAR 03/13/2016 Luanne Bras, MD [facility]  . IR GENERIC HISTORICAL  03/13/2016   IR BONE TUMOR(S)RF ABLATION 03/13/2016 Luanne Bras, MD [facility]  . IR GENERIC HISTORICAL  03/13/2016   IR KYPHO  LUMBAR INC FX REDUCE BONE BX UNI/BIL CANNULATION INC/IMAGING 03/13/2016 Luanne Bras, MD [facility]  . IR GENERIC HISTORICAL  03/13/2016   IR BONE TUMOR(S)RF ABLATION 03/13/2016 Luanne Bras, MD [facility]  . IR GENERIC HISTORICAL  03/13/2016   IR BONE TUMOR(S)RF ABLATION 03/13/2016 Luanne Bras, MD [facility]  . IR GENERIC HISTORICAL  03/31/2016   IR RADIOLOGIST EVAL & MGMT 03/31/2016 [facility]  . LAPAROSCOPIC INGUINAL HERNIA REPAIR Bilateral 12-16-2013  dr gross  . RADIOLOGY WITH ANESTHESIA N/A 02/15/2016   Procedure: Spinal Ablation;  Surgeon: Luanne Bras, MD;  Location: [place];  Service: Radiology;  Laterality: N/A;  . RADIOLOGY WITH ANESTHESIA N/A 03/13/2016   Procedure: LUMBER ABLATION;  Surgeon: Luanne Bras, MD;  Location: [place];  Service:  Radiology;  Laterality: N/A;  . ROTATOR CUFF REPAIR Right 2003  . TONSILLECTOMY  age 75  . WISDOM TOOTH EXTRACTION    :   Current Facility-Administered Medications:  .  0.9 %  sodium chloride infusion, , Intravenous, Continuous, Hongalgi, Anand D, MD, Last Rate: 100 mL/hr at 11/10/17 0620 .  acetaminophen (TYLENOL) tablet 650 mg, 650 mg, Oral, Q6H PRN, 650 mg at 11/10/17 0407 **OR** acetaminophen (TYLENOL) suppository 650 mg, 650 mg, Rectal, Q6H PRN, Hongalgi, Anand D, MD .  acidophilus (RISAQUAD) capsule 1 capsule, 1 capsule, Oral, Daily, Hongalgi, Anand D, MD .  albuterol (PROVENTIL) (2.5 MG/3ML) 0.083% nebulizer solution 2.5 mg, 2.5 mg, Nebulization, Q2H PRN, Hongalgi, Anand D, MD, 2.5 mg at 11/10/17 0156 .  albuterol (PROVENTIL) (2.5 MG/3ML) 0.083% nebulizer solution 2.5 mg, 2.5 mg, Nebulization, TID, Hongalgi, Anand D, MD .  benzonatate (TESSALON) capsule 200 mg, 200 mg, Oral, TID, Hongalgi, Anand D, MD, 200 mg at 11/09/17 2052 .  ceFEPIme (MAXIPIME) 1 g in sodium chloride 0.9 % 100 mL IVPB, 1 g, Intravenous, Q8H, Hongalgi, Lenis Dickinson, MD, Stopped at 11/10/17 0230 .  cholecalciferol (VITAMIN D) tablet 1,000 Units,  1,000 Units, Oral, Daily, Hongalgi, Anand D, MD .  famciclovir [facility]) tablet 500 mg, 500 mg, Oral, Daily, Hongalgi, Anand D, MD .  guaiFENesin-dextromethorphan (ROBITUSSIN DM) 100-10 MG/5ML syrup 5 mL, 5 mL, Oral, Q4H PRN, Modena Jansky, MD, 5 mL at 11/09/17 2010 .  ibuprofen (ADVIL,MOTRIN) tablet 600 mg, 600 mg, Oral, Q6H PRN, Hongalgi, Anand D, MD .  loratadine (CLARITIN) tablet 10 mg, 10 mg, Oral, Daily PRN, Hongalgi, Anand D, MD .  magnesium oxide (MAG-OX) tablet 400 mg, 400 mg, Oral, Daily, Hongalgi, Anand D, MD .  ondansetron (ZOFRAN) tablet 4 mg, 4 mg, Oral, Q6H PRN **OR** ondansetron (ZOFRAN) injection 4 mg, 4 mg, Intravenous, Q6H PRN, Hongalgi, Anand D, MD, 4 mg at 11/09/17 1856 .  polyethylene glycol (MIRALAX / GLYCOLAX) packet 17 g, 17 g, Oral, Daily PRN, Hongalgi, Anand D, MD .  rivaroxaban (XARELTO) tablet 10 mg, 10 mg, Oral, QPM, Hongalgi, Anand D, MD, 10 mg at 11/09/17 2100 .  sertraline (ZOLOFT) tablet 100 mg, 100 mg, Oral, QHS, Hongalgi, Anand D, MD, 100 mg at 11/09/17 2052 .  sodium chloride flush (NS) 0.9 % injection 3 mL, 3 mL, Intravenous, Q12H, Hongalgi, Anand D, MD, 3 mL at 11/09/17 2200 .  vancomycin (VANCOCIN) IVPB 1000 mg/200 mL premix, 1,000 mg, Intravenous, Q12H, Hongalgi, Lenis Dickinson, MD, Stopped at 11/09/17 2200 .  zolpidem (AMBIEN) tablet 5 mg, 5 mg, Oral, QHS PRN, Hongalgi, Anand D, MD, 5 mg at 11/09/17 2053:  . acidophilus  1 capsule Oral Daily  . albuterol  2.5 mg Nebulization TID  . benzonatate  200 mg Oral TID  . cholecalciferol  1,000 Units Oral Daily  . famciclovir  500 mg Oral Daily  . magnesium oxide  400 mg Oral Daily  . rivaroxaban  10 mg Oral QPM  . sertraline  100 mg Oral QHS  . sodium chloride flush  3 mL Intravenous Q12H  :  No Known Allergies:  Family History  Problem Relation Age of Onset  . Uterine cancer Mother   . Heart disease Father   . Hypertension Father   . Multiple sclerosis Sister   . Paranoid behavior Brother   . Drug  abuse Brother   . Schizophrenia Brother   . Stroke Maternal Grandfather   . Cancer Maternal Aunt   . Leukemia Paternal Aunt   .  Healthy Son        x1  . Healthy Daughter        x2  . Allergies Daughter        x1  . Diabetes Neg Hx   . Alzheimer's disease Neg Hx   . Parkinson's disease Neg Hx   :  Social History   Socioeconomic History  . Marital status: Married    Spouse name: Not on file  . Number of children: 3  . Years of education: Not on file  . Highest education level: Not on file  Occupational History  . Not on file  Social Needs  . Financial resource strain: Not on file  . Food insecurity:    Worry: Not on file    Inability: Not on file  . Transportation needs:    Medical: Not on file    Non-medical: Not on file  Tobacco Use  . Smoking status: Former Smoker    Packs/day: 1.00    Years: 7.00    Pack years: 7.00    Types: Cigarettes    Last attempt to quit: 11/02/1980    Years since quitting: 37.0  . Smokeless tobacco: Never Used  Substance and Sexual Activity  . Alcohol use: Yes    Comment: occasional  . Drug use: No  . Sexual activity: Never  Lifestyle  . Physical activity:    Days per week: Not on file    Minutes per session: Not on file  . Stress: Not on file  Relationships  . Social connections:    Talks on phone: Not on file    Gets together: Not on file    Attends religious service: Not on file    Active member of club or organization: Not on file    Attends meetings of clubs or organizations: Not on file    Relationship status: Not on file  . Intimate partner violence:    Fear of current or ex partner: Not on file    Emotionally abused: Not on file    Physically abused: Not on file    Forced sexual activity: Not on file  Other Topics Concern  . Not on file  Social History Narrative  . Not on file  :  Pertinent items are noted in HPI.  Exam: Patient Vitals for the past 24 hrs:  BP Temp Temp src Pulse Resp SpO2 Height Weight   11/10/17 0618 112/65 99.1 F (37.3 C) - 80 20 98 % - -  11/10/17 0400 - (!) 100.7 F (38.2 C) Oral - - - - -  11/10/17 0145 - 100 F (37.8 C) Oral - (!) 24 95 % - -  11/09/17 2231 118/69 99.2 F (37.3 C) - 92 (!) 21 96 % - -  11/09/17 1259 127/75 98.6 F (37 C) Oral 85 18 95 % - -  11/09/17 1130 118/73 - - 83 (!) 28 97 % - -  11/09/17 1115 126/87 - - 85 (!) 28 98 % - -  11/09/17 1100 125/72 - - 85 (!) 27 97 % - -  11/09/17 1045 129/73 - - 83 (!) 26 97 % - -  11/09/17 1030 126/79 - - 88 (!) 22 97 % - -  11/09/17 1015 135/77 - - 88 (!) 25 98 % - -  11/09/17 1000 (!) 125/104 - - 93 (!) 26 95 % - -  11/09/17 0945 127/84 - - 93 (!) 28 96 % - -  11/09/17 0935 107/78 (!) 100.4  F (38 C) Oral 90 (!) 24 96 % - -  11/09/17 0930 107/78 - - 92 20 97 % - -  11/09/17 0915 132/89 - - 99 (!) 26 95 % - -  11/09/17 0900 138/82 - - (!) 103 (!) 28 94 % - -  11/09/17 0855 (!) 136/93 - - (!) 105 (!) 21 95 % - -  11/09/17 0845 130/80 - - (!) 110 (!) 29 - - -  11/09/17 0820 - - - - - 93 % - -  11/09/17 0819 - - - 97 (!) 24 - - -  11/09/17 0816 138/87 - - 100 - 96 % - -  11/09/17 0812 123/79 - - (!) 101 - 92 % '5\' 7"'  (1.702 m) 170 lb (77.1 kg)  11/09/17 0811 126/80 (!) 102.8 F (39.3 C) Oral 96 18 91 % - -     Recent Labs    11/09/17 0827  WBC 3.6*  HGB 12.0*  HCT 36.5*  PLT 137*   Recent Labs    11/09/17 0827  NA 135  K 3.8  CL 101  CO2 23  GLUCOSE 127*  BUN 15  CREATININE 0.92  CALCIUM 8.7*    Blood smear review: None  Pathology: None    Assessment and Plan: Jorge [name] is a 62 year old white male.  He has a history of IgG kappa myeloma.  He underwent autologous stem cell transplant.  This was over a year ago.  He has done well.  Throughout all of his treatments and post transplant, he has never been hospitalized with any issue.  I am not sure why he has his fever and cough.  Is certainly could be some type of viral syndrome.  There is no obvious pneumonia.  I do not know if  you might need a CT scan of the chest.  His vital signs all look good.  He did spike a temperature of 102.8 yesterday.  Currently, his temperature is 99.1.  His oxygen saturation is 98%.  His IgG level has been okay.  It is hard to say what needs to be done right now.  Again, he looks pretty good.  His vital signs all look stable.  He did travel recently.  May be, we should consider checking him for pulmonary embolism although his oxygen saturation is all right.  He has had issues with lower extremity DVTs in the past.  For right now, I know he is getting fantastic care from everybody up on [address].  I will follow along.  Lattie Haw, MD  Darlyn Chamber 33:3

## 2017-11-10 NOTE — Progress Notes (Signed)
Patient requesting Torodol & acid reflux mediation.  Patient takes Zantac at home.  MD notified via text page.

## 2017-11-10 NOTE — Progress Notes (Signed)
Patient had 4th episode of diarrhea today.  MD notified via text page.

## 2017-11-10 NOTE — Consult Note (Addendum)
[facility] Infectious Disease       Reason for Consult: Enterococcal positive blood culture    Referring Physician: CHAMP autoconsult  Principal Problem:   Sepsis ([place]) Active Problems:   Multiple myeloma ([place])   HCAP (healthcare-associated pneumonia)   Thrombocytopenia ([place])   Bacteremia   . acidophilus  1 capsule Oral Daily  . albuterol  2.5 mg Nebulization TID  . benzonatate  200 mg Oral TID  . cholecalciferol  1,000 Units Oral Daily  . famciclovir  500 mg Oral Daily  . famotidine  20 mg Oral Daily  . iopamidol      . magnesium oxide  400 mg Oral Daily  . rivaroxaban  10 mg Oral QPM  . sertraline  100 mg Oral QHS  . sodium chloride flush  3 mL Intravenous Q12H    Recommendations: Continue vancomycin Continue cefepime Respiratory viral panel   Assessment: He has a productive cough, fever, bilateral infiltrates, hypoxia most c/w pneumonia.  Viral pneumonia would be most typical with bilateral findings.  Continue with cefepime for now.   Enterococcus in just 1 anaerobic blood culture out of 2 sets; significance of the positive blood culture uncertain.  If no other growth and etiology of fever as above, I do not feel further work up indicated.  If it grows out more, will reconsider. Will continue vancomycin for now.     Dr. Tommy Medal on tomorrow  Antibiotics: Vancomycin and cefepime  HPI: Jorge [name] is a 62 y.o. male with multiple myeloma on Revlimid, metastatic disease, compression fractures and lytic bone lesions c/w metastasis who came in with fever, cough and weakness.  Found to be hypoxic, bilateral infiltrates on CT.  No known sick contacts, no recent travel.  On Xarelto for history of DVT.   CT independently reviewed and some opacity at the bases bilateral confirmed  Review of Systems:  Constitutional: positive for fevers, chills and malaise Respiratory: positive for cough, sputum, negative for dyspnea on exertion Cardiovascular: negative for  dyspnea Gastrointestinal: some loose stool this am Integument/breast: negative for rash Musculoskeletal: negative for myalgias and arthralgias All other systems reviewed and are negative    Past Medical History:  Diagnosis Date  . Anxiety   . Bone metastasis ([place])   . Chest cold 05/19/2016   productive cough  -- started on antibiotic  . Chronic back pain    due to bone mets from myeloma  . Cough   . Depression   . GERD (gastroesophageal reflux disease)   . Hiatal hernia   . History of chicken pox   . History of concussion    age 85 -- no residual  . History of DVT of lower extremity 03/21/2016  treated and completed w/ xarelto   per doppler left extensive occlusion common femoral, femoral, and popliteal veins and right partial occlusion common femoral and profunda femoral veins/  last doppler 06-04-2016 no evidence acute or chronic dvt noted either leg   . History of radiation therapy 06/09/16-06/23/16   lower thoracic spine 25 Gy in 10 fractions  . Mouth ulcers    secondary to radiation  . Multiple myeloma ([place]) dx 02/22/2016 via bone marrow bx---  oncologist-  [name]   IgG Kappa-- Hyperdiploid/ +11 w/ bone mets--  current treatment chemotherapy (started 08/ 2017)and pallitive radiation to back started 06-09-2016  . Renal calculus, right   . Wears contact lenses     Social History   Tobacco Use  . Smoking status:  Former Smoker    Packs/day: 1.00    Years: 7.00    Pack years: 7.00    Types: Cigarettes    Last attempt to quit: 11/02/1980    Years since quitting: 37.0  . Smokeless tobacco: Never Used  Substance Use Topics  . Alcohol use: Yes    Comment: occasional  . Drug use: No    Family History  Problem Relation Age of Onset  . Uterine cancer Mother   . Heart disease Father   . Hypertension Father   . Multiple sclerosis Sister   . Paranoid behavior Brother   . Drug abuse Brother   . Schizophrenia Brother   . Stroke Maternal Grandfather   . Cancer Maternal  Aunt   . Leukemia Paternal Aunt   . Healthy Son        x1  . Healthy Daughter        x2  . Allergies Daughter        x1  . Diabetes Neg Hx   . Alzheimer's disease Neg Hx   . Parkinson's disease Neg Hx     No Known Allergies  Physical Exam: Constitutional: in no apparent distress and alert  Vitals:   11/10/17 0618 11/10/17 0935  BP: 112/65   Pulse: 80 94  Resp: 20 18  Temp: 99.1 F (37.3 C)   SpO2: 98% 94%   EYES: anicteric ENMT: no thrush Cardiovascular: Cor Tachy Respiratory: diminished breath sounds at the bases, + congestion; normal respiratory effort GI: Bowel sounds are normal, liver is not enlarged, spleen is not enlarged Musculoskeletal: no pedal edema noted Skin: negatives: no rash Hematologic: no cervical lad  Lab Results  Component Value Date   WBC 1.7 (L) 11/10/2017   HGB 10.3 (L) 11/10/2017   HCT 33.3 (L) 11/10/2017   MCV 98.8 11/10/2017   PLT 96 (L) 11/10/2017    Lab Results  Component Value Date   CREATININE 1.00 11/10/2017   BUN 13 11/10/2017   NA 140 11/10/2017   K 4.0 11/10/2017   CL 107 11/10/2017   CO2 25 11/10/2017    Lab Results  Component Value Date   ALT 46 11/09/2017   AST 47 (H) 11/09/2017   ALKPHOS 60 11/09/2017     Microbiology: Recent Results (from the past 240 hour(s))  Culture, blood (Routine x 2)     Status: None (Preliminary result)   Collection Time: 11/09/17  8:20 AM  Result Value Ref Range Status   Specimen Description   Final    BLOOD RIGHT ARM Performed at [facility], [address] [place], [place] [address]    Special Requests   Final    BOTTLES DRAWN AEROBIC AND ANAEROBIC Blood Culture adequate volume Performed at [facility], [address] [place], [place] [address]    Culture   Final    NO GROWTH < 24 HOURS Performed at [facility], [address], [place], [place] [address]    Report Status PENDING  Incomplete  Culture, blood (Routine x 2)     Status: None  (Preliminary result)   Collection Time: 11/09/17  8:40 AM  Result Value Ref Range Status   Specimen Description   Final    BLOOD RIGHT HAND Performed at [facility], [address] [place], [place] [address]    Special Requests   Final    BOTTLES DRAWN AEROBIC AND ANAEROBIC Blood Culture adequate volume Performed at [facility], [address] [place], [place] [address]    Culture  Setup Time   Final    ANAEROBIC BOTTLE ONLY Organism ID to follow GRAM POSITIVE COCCI IN PAIRS IN CHAINS CRITICAL RESULT CALLED TO, READ BACK BY AND VERIFIED WITH: D. Wofford PharmD 10:10 11/10/17 ([email or web address])    Culture   Final    NO GROWTH < 24 HOURS Performed at [facility], [place] [address], [place], [place] [address]    Report Status PENDING  Incomplete  Blood Culture ID Panel (Reflexed)     Status: Abnormal   Collection Time: 11/09/17  8:40 AM  Result Value Ref Range Status   Enterococcus species DETECTED (A) NOT DETECTED Final    Comment: CRITICAL RESULT CALLED TO, READ BACK BY AND VERIFIED WITH: D. Wofford PharmD 10:10 11/10/17 ([email or web address])    Vancomycin resistance NOT DETECTED NOT DETECTED Final   Listeria monocytogenes NOT DETECTED NOT DETECTED Final   Staphylococcus species NOT DETECTED NOT DETECTED Final   Staphylococcus aureus NOT DETECTED NOT DETECTED Final   Methicillin resistance NOT DETECTED NOT DETECTED Final   Streptococcus species NOT DETECTED NOT DETECTED Final   Streptococcus agalactiae NOT DETECTED NOT DETECTED Final   Streptococcus pneumoniae NOT DETECTED NOT DETECTED Final   Streptococcus pyogenes NOT DETECTED NOT DETECTED Final   Acinetobacter baumannii NOT DETECTED NOT DETECTED Final   Enterobacteriaceae species NOT DETECTED NOT DETECTED Final   Enterobacter cloacae complex NOT DETECTED NOT DETECTED Final   Escherichia coli NOT DETECTED NOT DETECTED Final   Klebsiella oxytoca NOT DETECTED NOT DETECTED Final   Klebsiella pneumoniae NOT DETECTED NOT  DETECTED Final   Proteus species NOT DETECTED NOT DETECTED Final   Serratia marcescens NOT DETECTED NOT DETECTED Final   Carbapenem resistance NOT DETECTED NOT DETECTED Final   Haemophilus influenzae NOT DETECTED NOT DETECTED Final   Neisseria meningitidis NOT DETECTED NOT DETECTED Final   Pseudomonas aeruginosa NOT DETECTED NOT DETECTED Final   Candida albicans NOT DETECTED NOT DETECTED Final   Candida glabrata NOT DETECTED NOT DETECTED Final   Candida krusei NOT DETECTED NOT DETECTED Final   Candida parapsilosis NOT DETECTED NOT DETECTED Final   Candida tropicalis NOT DETECTED NOT DETECTED Final    Thayer Headings, MD [facility] [email or web address] [phone] pager  [phone] cell 11/10/2017, 1:16 PM

## 2017-11-11 ENCOUNTER — Inpatient Hospital Stay (HOSPITAL_COMMUNITY): Payer: 59

## 2017-11-11 DIAGNOSIS — I351 Nonrheumatic aortic (valve) insufficiency: Secondary | ICD-10-CM

## 2017-11-11 DIAGNOSIS — B952 Enterococcus as the cause of diseases classified elsewhere: Secondary | ICD-10-CM

## 2017-11-11 DIAGNOSIS — R7881 Bacteremia: Secondary | ICD-10-CM

## 2017-11-11 DIAGNOSIS — J1000 Influenza due to other identified influenza virus with unspecified type of pneumonia: Secondary | ICD-10-CM

## 2017-11-11 DIAGNOSIS — J101 Influenza due to other identified influenza virus with other respiratory manifestations: Secondary | ICD-10-CM

## 2017-11-11 LAB — ECHOCARDIOGRAM COMPLETE
HEIGHTINCHES: 67 in
WEIGHTICAEL: 2720 [oz_av]

## 2017-11-11 LAB — BASIC METABOLIC PANEL
ANION GAP: 7 (ref 5–15)
BUN: 9 mg/dL (ref 6–20)
CO2: 27 mmol/L (ref 22–32)
Calcium: 8.5 mg/dL — ABNORMAL LOW (ref 8.9–10.3)
Chloride: 108 mmol/L (ref 101–111)
Creatinine, Ser: 0.86 mg/dL (ref 0.61–1.24)
GFR calc Af Amer: >60 mL/min (ref >60)
GLUCOSE: 104 mg/dL — AB (ref 65–99)
POTASSIUM: 4.2 mmol/L (ref 3.5–5.1)
SODIUM: 142 mmol/L (ref 135–145)

## 2017-11-11 LAB — RESPIRATORY PANEL BY PCR
Adenovirus: NOT DETECTED
BORDETELLA PERTUSSIS-RVPCR: NOT DETECTED
CHLAMYDOPHILA PNEUMONIAE-RVPPCR: NOT DETECTED
CORONAVIRUS HKU1-RVPPCR: NOT DETECTED
Coronavirus 229E: NOT DETECTED
Coronavirus NL63: NOT DETECTED
Coronavirus OC43: NOT DETECTED
Influenza A: NOT DETECTED
Influenza B: NOT DETECTED
MYCOPLASMA PNEUMONIAE-RVPPCR: NOT DETECTED
Metapneumovirus: NOT DETECTED
PARAINFLUENZA VIRUS 3-RVPPCR: DETECTED — AB
Parainfluenza Virus 1: NOT DETECTED
Parainfluenza Virus 2: NOT DETECTED
Parainfluenza Virus 4: NOT DETECTED
RHINOVIRUS / ENTEROVIRUS - RVPPCR: NOT DETECTED
Respiratory Syncytial Virus: NOT DETECTED

## 2017-11-11 LAB — CBC
HCT: 32.3 % — ABNORMAL LOW (ref 39.0–52.0)
Hemoglobin: 10 g/dL — ABNORMAL LOW (ref 13.0–17.0)
MCH: 30.3 pg (ref 26.0–34.0)
MCHC: 31 g/dL (ref 30.0–36.0)
MCV: 97.9 fL (ref 78.0–100.0)
PLATELETS: 91 10*3/uL — AB (ref 150–400)
RBC: 3.3 MIL/uL — AB (ref 4.22–5.81)
RDW: 14.2 % (ref 11.5–15.5)
WBC: 1.9 10*3/uL — AB (ref 4.0–10.5)

## 2017-11-11 LAB — VANCOMYCIN, TROUGH: Vancomycin Tr: 8 ug/mL — ABNORMAL LOW (ref 15–20)

## 2017-11-11 LAB — VANCOMYCIN, PEAK: Vancomycin Pk: 24 ug/mL — ABNORMAL LOW (ref 30–40)

## 2017-11-11 MED ORDER — SODIUM CHLORIDE 0.9 % IV SOLN
1250.0000 mg | Freq: Two times a day (BID) | INTRAVENOUS | Status: DC
  Filled 2017-11-11 (×2): qty 1250

## 2017-11-11 NOTE — Progress Notes (Signed)
Subjective:  Improvement in dyspnea.  Antibiotics:  Anti-infectives (From admission, onward)   Start     Dose/Rate Route Frequency Ordered Stop   11/11/17 2000  vancomycin (VANCOCIN) 1,250 mg in sodium chloride 0.9 % 250 mL IVPB     1,250 mg 166.7 mL/hr over 90 Minutes Intravenous Every 12 hours 11/11/17 1310     11/09/17 2000  famciclovir (FAMVIR) tablet 500 mg     500 mg Oral Daily 11/09/17 1814     11/09/17 1700  ceFEPIme (MAXIPIME) 1 g in sodium chloride 0.9 % 100 mL IVPB  Status:  Discontinued     1 g 200 mL/hr over 30 Minutes Intravenous Every 8 hours 11/09/17 0900 11/11/17 1840   11/09/17 1000  vancomycin (VANCOCIN) IVPB 1000 mg/200 mL premix  Status:  Discontinued     1,000 mg 200 mL/hr over 60 Minutes Intravenous Every 12 hours 11/09/17 0900 11/11/17 1310   11/09/17 0926  ceFEPIme (MAXIPIME) 1 g injection    Note to Pharmacy:  Lurene Shadow   : cabinet override      11/09/17 0926 11/09/17 0934   11/09/17 0900  ceFEPIme (MAXIPIME) 1 g in sodium chloride 0.9 % 100 mL IVPB     1 g 200 mL/hr over 30 Minutes Intravenous  Once 11/09/17 0855 11/09/17 1134      Medications: Scheduled Meds: . acidophilus  1 capsule Oral Daily  . albuterol  2.5 mg Nebulization TID  . benzonatate  200 mg Oral TID  . cholecalciferol  1,000 Units Oral Daily  . famciclovir  500 mg Oral Daily  . famotidine  20 mg Oral Daily  . magnesium oxide  400 mg Oral Daily  . rivaroxaban  10 mg Oral QPM  . sertraline  100 mg Oral QHS  . sodium chloride flush  3 mL Intravenous Q12H   Continuous Infusions: . vancomycin     PRN Meds:.acetaminophen **OR** acetaminophen, albuterol, guaiFENesin-dextromethorphan, ketorolac, loratadine, ondansetron **OR** ondansetron (ZOFRAN) IV, polyethylene glycol, zolpidem    Objective: Weight change:   Intake/Output Summary (Last 24 hours) at 11/11/2017 1840 Last data filed at 11/11/2017 0954 Gross per 24 hour  Intake 120 ml  Output 400 ml  Net -280 ml    Blood pressure 139/67, pulse 77, temperature 99.1 F (37.3 C), resp. rate 16, height '5\' 7"'  (1.702 m), weight 170 lb (77.1 kg), SpO2 96 %. Temp:  [97.7 F (36.5 C)-100.7 F (38.2 C)] 99.1 F (37.3 C) (05/01 1700) Pulse Rate:  [72-83] 77 (05/01 1447) Resp:  [16-21] 16 (05/01 1447) BP: (120-140)/(67-86) 139/67 (05/01 1447) SpO2:  [94 %-99 %] 96 % (05/01 1447)  Physical Exam: General: Alert and awake, oriented x3, not in any acute distress. HEENT: anicteric sclera, EOMI CVS regular rate, normal r,  no murmur rubs or gallops Chest: Fairly clear to auscultation anteriorly no respiratory distress or wheezing Abdomen: soft nontender, nondistended, normal bowel sounds, Extremities: no  clubbing or edema noted bilaterally Skin: no rashes Neuro: nonfocal     BMET Recent Labs    11/10/17 0703 11/11/17 0627  NA 140 142  K 4.0 4.2  CL 107 108  CO2 25 27  GLUCOSE 110* 104*  BUN 13 9  CREATININE 1.00 0.86  CALCIUM 8.0* 8.5*     Liver Panel  Recent Labs    11/09/17 0827  PROT 7.1  ALBUMIN 4.4  AST 47*  ALT 46  ALKPHOS 60  BILITOT 0.6  Sedimentation Rate No results for input(s): ESRSEDRATE in the last 72 hours. C-Reactive Protein No results for input(s): CRP in the last 72 hours.  Micro Results: Recent Results (from the past 720 hour(s))  Culture, blood (Routine x 2)     Status: None (Preliminary result)   Collection Time: 11/09/17  8:20 AM  Result Value Ref Range Status   Specimen Description   Final    BLOOD RIGHT ARM Performed at [facility], [address] [place], [place] [address]    Special Requests   Final    BOTTLES DRAWN AEROBIC AND ANAEROBIC Blood Culture adequate volume Performed at [facility], [address] [place], [place] [address]    Culture   Final    NO GROWTH 2 DAYS Performed at [facility], [place] [address], [place], [place] [address]    Report Status PENDING  Incomplete  Culture, blood (Routine  x 2)     Status: Abnormal (Preliminary result)   Collection Time: 11/09/17  8:40 AM  Result Value Ref Range Status   Specimen Description   Final    BLOOD RIGHT HAND Performed at [facility], [address] [place], [place] [address]    Special Requests   Final    BOTTLES DRAWN AEROBIC AND ANAEROBIC Blood Culture adequate volume Performed at [facility], [address] [place], [place] [address]    Culture  Setup Time   Final    ANAEROBIC BOTTLE ONLY GRAM POSITIVE COCCI IN PAIRS IN CHAINS CRITICAL RESULT CALLED TO, READ BACK BY AND VERIFIED WITH: D. Wofford PharmD 10:10 11/10/17 ([email or web address]) Performed at [facility], [place] [address], [place], [place] [address]    Culture ENTEROCOCCUS FAECALIS (A)  Final   Report Status PENDING  Incomplete  Blood Culture ID Panel (Reflexed)     Status: Abnormal   Collection Time: 11/09/17  8:40 AM  Result Value Ref Range Status   Enterococcus species DETECTED (A) NOT DETECTED Final    Comment: CRITICAL RESULT CALLED TO, READ BACK BY AND VERIFIED WITH: D. Wofford PharmD 10:10 11/10/17 ([email or web address])    Vancomycin resistance NOT DETECTED NOT DETECTED Final   Listeria monocytogenes NOT DETECTED NOT DETECTED Final   Staphylococcus species NOT DETECTED NOT DETECTED Final   Staphylococcus aureus NOT DETECTED NOT DETECTED Final   Methicillin resistance NOT DETECTED NOT DETECTED Final   Streptococcus species NOT DETECTED NOT DETECTED Final   Streptococcus agalactiae NOT DETECTED NOT DETECTED Final   Streptococcus pneumoniae NOT DETECTED NOT DETECTED Final   Streptococcus pyogenes NOT DETECTED NOT DETECTED Final   Acinetobacter baumannii NOT DETECTED NOT DETECTED Final   Enterobacteriaceae species NOT DETECTED NOT DETECTED Final   Enterobacter cloacae complex NOT DETECTED NOT DETECTED Final   Escherichia coli NOT DETECTED NOT DETECTED Final   Klebsiella oxytoca NOT DETECTED NOT DETECTED Final   Klebsiella pneumoniae NOT DETECTED  NOT DETECTED Final   Proteus species NOT DETECTED NOT DETECTED Final   Serratia marcescens NOT DETECTED NOT DETECTED Final   Carbapenem resistance NOT DETECTED NOT DETECTED Final   Haemophilus influenzae NOT DETECTED NOT DETECTED Final   Neisseria meningitidis NOT DETECTED NOT DETECTED Final   Pseudomonas aeruginosa NOT DETECTED NOT DETECTED Final   Candida albicans NOT DETECTED NOT DETECTED Final   Candida glabrata NOT DETECTED NOT DETECTED Final   Candida krusei NOT DETECTED NOT DETECTED Final   Candida parapsilosis NOT DETECTED NOT DETECTED Final  Candida tropicalis NOT DETECTED NOT DETECTED Final  Respiratory Panel by PCR     Status: Abnormal   Collection Time: 11/10/17  1:31 PM  Result Value Ref Range Status   Adenovirus NOT DETECTED NOT DETECTED Final   Coronavirus 229E NOT DETECTED NOT DETECTED Final   Coronavirus HKU1 NOT DETECTED NOT DETECTED Final   Coronavirus NL63 NOT DETECTED NOT DETECTED Final   Coronavirus OC43 NOT DETECTED NOT DETECTED Final   Metapneumovirus NOT DETECTED NOT DETECTED Final   Rhinovirus / Enterovirus NOT DETECTED NOT DETECTED Final   Influenza A NOT DETECTED NOT DETECTED Final   Influenza B NOT DETECTED NOT DETECTED Final   Parainfluenza Virus 1 NOT DETECTED NOT DETECTED Final   Parainfluenza Virus 2 NOT DETECTED NOT DETECTED Final   Parainfluenza Virus 3 DETECTED (A) NOT DETECTED Final   Parainfluenza Virus 4 NOT DETECTED NOT DETECTED Final   Respiratory Syncytial Virus NOT DETECTED NOT DETECTED Final   Bordetella pertussis NOT DETECTED NOT DETECTED Final   Chlamydophila pneumoniae NOT DETECTED NOT DETECTED Final   Mycoplasma pneumoniae NOT DETECTED NOT DETECTED Final    Comment: Performed at [facility], [place] [address], [place], [place] [address]  C difficile quick scan w PCR reflex     Status: None   Collection Time: 11/10/17  6:25 PM  Result Value Ref Range Status   C Diff antigen NEGATIVE NEGATIVE Final   C Diff toxin NEGATIVE  NEGATIVE Final   C Diff interpretation No C. difficile detected.  Final    Comment: Performed at [facility], [place] [address] [place], [place] [address]    Studies/Results: X-ray Chest Pa And Lateral  Result Date: 11/10/2017 CLINICAL DATA:  Cough, chest congestion, and shortness of breath which have worsened over the past 3 days. EXAM: CHEST - 2 VIEW COMPARISON:  Chest x-ray of April 29th 2019 and chest CT scan of today's date FINDINGS: The right lung is mildly hypoinflated. The hemidiaphragm is elevated with respect to the left. There is bibasilar interstitial density. There is no large pleural effusion. The heart is normal in size. The pulmonary vascularity is not engorged. IMPRESSION: Bibasilar atelectasis or pneumonia. Elevation of the right hemidiaphragm. Electronically Signed   By: David  Martinique M.D.   On: 11/10/2017 10:06   Ct Angio Chest Pe W Or Wo Contrast  Result Date: 11/10/2017 CLINICAL DATA:  Shortness of breath.  History of multiple myeloma EXAM: CT ANGIOGRAPHY CHEST WITH CONTRAST TECHNIQUE: Multidetector CT imaging of the chest was performed using the standard protocol during bolus administration of intravenous contrast. Multiplanar CT image reconstructions and MIPs were obtained to evaluate the vascular anatomy. CONTRAST:  131m ISOVUE-370 IOPAMIDOL (ISOVUE-370) INJECTION 76% COMPARISON:  Chest radiograph November 09, 2017; thoracic MRI July 30, 2016 FINDINGS: Cardiovascular: There is no demonstrable pulmonary embolus. There is no thoracic aortic aneurysm or dissection. The visualized great vessels appear normal. There is no appreciable pericardial effusion or pericardial thickening evident. Mediastinum/Nodes: Thyroid appears unremarkable. There is no appreciable thoracic adenopathy. There is a sizable hiatal type hernia. Lungs/Pleura: There is patchy consolidation with associated atelectasis in the right lower lobe, primarily in the posterior and to a lesser extent  medial segments of the right lower lobe. There is patchy atelectasis with probable consolidation in a portion of the posterior segment of the left lower lobe. There is airspace consolidation in the apical segment of the right upper lobe. There is no parenchymal lung mass or nodular lesion. No appreciable pleural effusion  or pleural thickening. Upper Abdomen: There is diffuse hepatic steatosis. There is reflux of contrast into the inferior vena cava and portal veins. Visualized upper abdominal structures otherwise appear unremarkable. Musculoskeletal: There is elevation of the right hemidiaphragm. There are lytic bone lesions throughout essentially all visualized vertebral bodies. The patient is undergone multiple kyphoplasty procedures. There is marked collapse of the T7 and T9 vertebral bodies with mild retropulsion of bone into the canal at the T9 level causing moderate spinal stenosis. Mild anterior wedging at T5 is stable compared to prior MR. Other areas of fracture have undergone kyphoplasty type procedures. There are small lytic lesions in each scapula. Review of the MIP images confirms the above findings. IMPRESSION: 1. No demonstrable pulmonary embolus. Thoracic aortic aneurysm or dissection. 2. Areas of atelectasis with associated consolidation felt to represent pneumonia in each lower lobe. Focal infiltrate apical segment right upper lobe. 3.  No appreciable adenopathy. 4.  Stable fairly sizable hiatal hernia. 5. Extensive bony changes of multiple myeloma. Patient has undergone multiple kyphoplasty type procedures in the thoracic and lumbar regions. Note that there is retropulsion of bone at T9 causing moderate spinal stenosis, a finding also present on prior thoracic MR from January 2018 and unchanged. 6.  Hepatic steatosis. 7.  Stable elevation of the right hemidiaphragm. Electronically Signed   By: Lowella Grip III M.D.   On: 11/10/2017 09:15      Assessment/Plan:  INTERVAL  HISTORY:Parainfluenza virus is positive on respiratory virus panel.   Principal Problem:   Sepsis ([place]) Active Problems:   Multiple myeloma ([place])   HCAP (healthcare-associated pneumonia)   Thrombocytopenia ([facility])   Bacteremia    Jorge [name] is a 62 y.o. male with with metastatic disease and compression fractures on revlimid.  He was admitted and found to have hypoxia and bilateral infiltrates on CT scan.  He was started on broad-spectrum antibiotics in the form of vancomycin and cefepime.  1 of his admission blood cultures out of 4 turned positive for enterococcus and we were automatically consulted.  In the interim Dr. Linus Salmons has sent a respiratory viral panel for pathogens and this has come back with parainfluenza virus 3.  #1 viral pneumonia due to parainfluenza virus: Continue supportive care  2.  Positive enterococcus from blood culture: I suspect this is a contaminant and I am stopping his antibiotics.  We will need to observe him off antibiotics to ensure that this is indeed a viral pneumonia with the enterococcus representing a contaminant.  I will sign off for now though please call with further questions.   LOS: 2 days   Alcide Evener 11/11/2017, 6:40 PM

## 2017-11-11 NOTE — Progress Notes (Signed)
Patient ID: Jorge [name], male   DOB: 06-Aug-1955, 63 y.o.   MRN: [id number]                                                                PROGRESS NOTE                                                                                                                                                                                                             Patient Demographics:    Jorge [name], is a 62 y.o. male, DOB - January 05, 1956, IRJ:[id number]  Admit date - 11/09/2017   Admitting Physician Modena Jansky, MD  Outpatient Primary MD for the patient is Saguier, Iris Pert  LOS - 2  Outpatient Specialists:  Chief Complaint  Patient presents with  . Cough  . Fever       Brief Narrative   62 year old married male, independent and physically quite active, PMH of multiple myeloma on Revlimid, bony metastasis, chronic back pain related to same, history of DVT for which she completed a full dose anticoagulation and currently is on prophylactic dose Xarelto for the last year, anxiety, depression, GERD, presented to [facility] with above complaints.  He was in his usual state of health until 2 days ago when he developed nonproductive cough which progressively got worse until last night he was coughing continuously, associated dyspnea, wheezing, this morning noted headache and presented to the ED where noted to have high fever of 102.8 F.  He has chronic runny nose for the last couple of months.  No earache or sore throat reported.  Appetite decreased.  ED Course: Chest x-ray showed right hemi-diaphragm elevation with right base atelectasis and a large hiatal hernia but no obvious pneumonia.  Urine microscopy negative for UTI features.  Started on IV fluids and empirically on IV cefepime and vancomycin for suspected developing pneumonia.  Transferred to [facility].      Subjective:    Jorge [name] today had fever yesterday, none this am.  Doing well.  Slight cough  yellow/ green sputum. .  Denies cp, palp, sob, n/v, diarrhea, brbpr  No headache, No abdominal pain - No Nausea, No new weakness tingling or numbness,   Assessment  & Plan :    Principal Problem:   Sepsis (  [place]) Active Problems:   Multiple myeloma ([place])   HCAP (healthcare-associated pneumonia)   Thrombocytopenia ([place])   Bacteremia   Sepsis ([place]) with enterococcal bacteremia, parainfluenza -he met sepsis criteria at the time of admission with fevers, dyspnea, leukocytosis, likely due to developing pneumonia. BCID growing enterococcus - ID consulted, appreciate input -follow 2D echo -For now we will continue IV vancomycin and cefepime, follow blood cultures and sensitivities -Continue IV fluid hydration   Active Problems: Metastatic multiple myeloma ([facility]) -Seen by [name], Revlimid currently on hold due to acute sepsis and pneumonia    HCAP (healthcare-associated pneumonia) bilateral lower lobe -Continue IV vancomycin and cefepime -CT chest showed no PE, pneumonia in each lower lobe, focal infiltrates apical segment right upper lobe.  Extensive bony changes of multiple myeloma    Thrombocytopenia ([place]), pancytopenia -Likely due to chemo medications, follow counts closely  History of DVT -Completed full therapeutic dose of Xarelto, continue prophylactic Xarelto  Anxiety, depression -Continue Zoloft   Code Status: Full code DVT Prophylaxis: Xarelto Family Communication: Discussed in detail with the patient, all imaging results, lab results explained to the patient    Disposition Plan:  Home when stable  Time Spent in minutes   35 minutes  Procedures:  CTA chest   Consultants:   ID oncology  Antimicrobials:   IV vancomycin 4/29  IV cefepime 4/29       Lab Results  Component Value Date   PLT 91 (L) 11/11/2017     Anti-infectives (From admission, onward)   Start     Dose/Rate Route Frequency Ordered Stop   11/09/17 2000  famciclovir  (FAMVIR) tablet 500 mg     500 mg Oral Daily 11/09/17 1814     11/09/17 1700  ceFEPIme (MAXIPIME) 1 g in sodium chloride 0.9 % 100 mL IVPB     1 g 200 mL/hr over 30 Minutes Intravenous Every 8 hours 11/09/17 0900     11/09/17 1000  vancomycin (VANCOCIN) IVPB 1000 mg/200 mL premix     1,000 mg 200 mL/hr over 60 Minutes Intravenous Every 12 hours 11/09/17 0900     11/09/17 0926  ceFEPIme (MAXIPIME) 1 g injection    Note to Pharmacy:  Jorge [name]   : cabinet override      11/09/17 0926 11/09/17 0934   11/09/17 0900  ceFEPIme (MAXIPIME) 1 g in sodium chloride 0.9 % 100 mL IVPB     1 g 200 mL/hr over 30 Minutes Intravenous  Once 11/09/17 0855 11/09/17 1134        Objective:   Vitals:   11/10/17 1741 11/10/17 1937 11/10/17 2156 11/11/17 0451  BP:   140/80 120/86  Pulse:   83 72  Resp:   20 (!) 21  Temp: 97.8 F (36.6 C)  (!) 100.7 F (38.2 C) 97.7 F (36.5 C)  TempSrc: Oral     SpO2:  94% 99% 96%  Weight:      Height:        Wt Readings from Last 3 Encounters:  11/09/17 77.1 kg (170 lb)  09/30/17 77.6 kg (171 lb)  08/18/17 76.7 kg (169 lb)     Intake/Output Summary (Last 24 hours) at 11/11/2017 0803 Last data filed at 11/10/2017 2219 Gross per 24 hour  Intake -  Output 1078 ml  Net -1078 ml     Physical Exam  Awake Alert, Oriented X 3, No new F.N deficits, Normal affect [place].AT,PERRAL Supple Neck,No JVD, No cervical lymphadenopathy appriciated.  Symmetrical Chest wall  movement, Good air movement bilaterally, slight crackles left > right base RRR,No Gallops,Rubs or new Murmurs, No Parasternal Heave +ve B.Sounds, Abd Soft, No tenderness, No organomegaly appriciated, No rebound - guarding or rigidity. No Cyanosis, Clubbing or edema, No new Rash or bruise     Data Review:    CBC Recent Labs  Lab 11/09/17 0827 11/10/17 0703 11/11/17 0627  WBC 3.6* 1.7* 1.9*  HGB 12.0* 10.3* 10.0*  HCT 36.5* 33.3* 32.3*  PLT 137* 96* 91*  MCV 95.1 98.8 97.9  MCH 31.3 30.6  30.3  MCHC 32.9 30.9 31.0  RDW 13.9 14.5 14.2  LYMPHSABS 0.5*  --   --   MONOABS 0.4  --   --   EOSABS 0.1  --   --   BASOSABS 0.0  --   --     Chemistries  Recent Labs  Lab 11/09/17 0827 11/10/17 0703 11/11/17 0627  NA 135 140 142  K 3.8 4.0 4.2  CL 101 107 108  CO2 _0 GLUCOSE 127* 110* 104*  BUN _1 CREATININE 0.92 1.00 0.86  CALCIUM 8.7* 8.0* 8.5*  AST 47*  --   --   ALT 46  --   --   ALKPHOS 60  --   --   BILITOT 0.6  --   --    ------------------------------------------------------------------------------------------------------------------ No results for input(s): CHOL, HDL, LDLCALC, TRIG, CHOLHDL, LDLDIRECT in the last 72 hours.  Lab Results  Component Value Date   HGBA1C 5.1 05/14/2016   ------------------------------------------------------------------------------------------------------------------ No results for input(s): TSH, T4TOTAL, T3FREE, THYROIDAB in the last 72 hours.  Invalid input(s): FREET3 ------------------------------------------------------------------------------------------------------------------ No results for input(s): VITAMINB12, FOLATE, FERRITIN, TIBC, IRON, RETICCTPCT in the last 72 hours.  Coagulation profile Recent Labs  Lab 11/09/17 0827  INR 1.24    No results for input(s): DDIMER in the last 72 hours.  Cardiac Enzymes No results for input(s): CKMB, TROPONINI, MYOGLOBIN in the last 168 hours.  Invalid input(s): CK ------------------------------------------------------------------------------------------------------------------ No results found for: BNP  Inpatient Medications  Scheduled Meds: . acidophilus  1 capsule Oral Daily  . albuterol  2.5 mg Nebulization TID  . benzonatate  200 mg Oral TID  . cholecalciferol  1,000 Units Oral Daily  . famciclovir  500 mg Oral Daily  . famotidine  20 mg Oral Daily  . magnesium oxide  400 mg Oral Daily  . rivaroxaban  10 mg Oral QPM  . sertraline  100 mg Oral QHS   . sodium chloride flush  3 mL Intravenous Q12H   Continuous Infusions: . ceFEPime (MAXIPIME) IV 1 g (11/11/17 0018)  . vancomycin Stopped (11/10/17 2230)   PRN Meds:.acetaminophen **OR** acetaminophen, albuterol, guaiFENesin-dextromethorphan, ketorolac, loratadine, ondansetron **OR** ondansetron (ZOFRAN) IV, polyethylene glycol, zolpidem  Micro Results Recent Results (from the past 240 hour(s))  Culture, blood (Routine x 2)     Status: None (Preliminary result)   Collection Time: 11/09/17  8:20 AM  Result Value Ref Range Status   Specimen Description   Final    BLOOD RIGHT ARM Performed at [facility], [address] [place], [place] [address]    Special Requests   Final    BOTTLES DRAWN AEROBIC AND ANAEROBIC Blood Culture adequate volume Performed at [facility], [address] [place], [place] [address]    Culture   Final    NO GROWTH < 24 HOURS Performed at [facility], [place] [address], [place], [place] [address]  Report Status PENDING  Incomplete  Culture, blood (Routine x 2)     Status: None (Preliminary result)   Collection Time: 11/09/17  8:40 AM  Result Value Ref Range Status   Specimen Description   Final    BLOOD RIGHT HAND Performed at [facility], [address] [place], [place] [address]    Special Requests   Final    BOTTLES DRAWN AEROBIC AND ANAEROBIC Blood Culture adequate volume Performed at [facility], [address] [place], [place] [address]    Culture  Setup Time   Final    ANAEROBIC BOTTLE ONLY Organism ID to follow GRAM POSITIVE COCCI IN PAIRS IN CHAINS CRITICAL RESULT CALLED TO, READ BACK BY AND VERIFIED WITH: D. Wofford PharmD 10:10 11/10/17 ([email or web address])    Culture   Final    NO GROWTH < 24 HOURS Performed at [facility], [place] [address] [place], [place] [address]    Report Status PENDING  Incomplete  Blood Culture ID Panel (Reflexed)     Status: Abnormal   Collection Time:  11/09/17  8:40 AM  Result Value Ref Range Status   Enterococcus species DETECTED (A) NOT DETECTED Final    Comment: CRITICAL RESULT CALLED TO, READ BACK BY AND VERIFIED WITH: D. Wofford PharmD 10:10 11/10/17 ([email or web address])    Vancomycin resistance NOT DETECTED NOT DETECTED Final   Listeria monocytogenes NOT DETECTED NOT DETECTED Final   Staphylococcus species NOT DETECTED NOT DETECTED Final   Staphylococcus aureus NOT DETECTED NOT DETECTED Final   Methicillin resistance NOT DETECTED NOT DETECTED Final   Streptococcus species NOT DETECTED NOT DETECTED Final   Streptococcus agalactiae NOT DETECTED NOT DETECTED Final   Streptococcus pneumoniae NOT DETECTED NOT DETECTED Final   Streptococcus pyogenes NOT DETECTED NOT DETECTED Final   Acinetobacter baumannii NOT DETECTED NOT DETECTED Final   Enterobacteriaceae species NOT DETECTED NOT DETECTED Final   Enterobacter cloacae complex NOT DETECTED NOT DETECTED Final   Escherichia coli NOT DETECTED NOT DETECTED Final   Klebsiella oxytoca NOT DETECTED NOT DETECTED Final   Klebsiella pneumoniae NOT DETECTED NOT DETECTED Final   Proteus species NOT DETECTED NOT DETECTED Final   Serratia marcescens NOT DETECTED NOT DETECTED Final   Carbapenem resistance NOT DETECTED NOT DETECTED Final   Haemophilus influenzae NOT DETECTED NOT DETECTED Final   Neisseria meningitidis NOT DETECTED NOT DETECTED Final   Pseudomonas aeruginosa NOT DETECTED NOT DETECTED Final   Candida albicans NOT DETECTED NOT DETECTED Final   Candida glabrata NOT DETECTED NOT DETECTED Final   Candida krusei NOT DETECTED NOT DETECTED Final   Candida parapsilosis NOT DETECTED NOT DETECTED Final   Candida tropicalis NOT DETECTED NOT DETECTED Final  Respiratory Panel by PCR     Status: Abnormal   Collection Time: 11/10/17  1:31 PM  Result Value Ref Range Status   Adenovirus NOT DETECTED NOT DETECTED Final   Coronavirus 229E NOT DETECTED NOT DETECTED Final   Coronavirus HKU1 NOT DETECTED NOT  DETECTED Final   Coronavirus NL63 NOT DETECTED NOT DETECTED Final   Coronavirus OC43 NOT DETECTED NOT DETECTED Final   Metapneumovirus NOT DETECTED NOT DETECTED Final   Rhinovirus / Enterovirus NOT DETECTED NOT DETECTED Final   Influenza A NOT DETECTED NOT DETECTED Final   Influenza B NOT DETECTED NOT DETECTED Final   Parainfluenza Virus 1 NOT DETECTED NOT DETECTED Final   Parainfluenza Virus 2 NOT DETECTED NOT DETECTED Final   Parainfluenza Virus 3 DETECTED (A) NOT DETECTED  Final   Parainfluenza Virus 4 NOT DETECTED NOT DETECTED Final   Respiratory Syncytial Virus NOT DETECTED NOT DETECTED Final   Bordetella pertussis NOT DETECTED NOT DETECTED Final   Chlamydophila pneumoniae NOT DETECTED NOT DETECTED Final   Mycoplasma pneumoniae NOT DETECTED NOT DETECTED Final    Comment: Performed at [facility], [place] [address] [place], [place] [address]  C difficile quick scan w PCR reflex     Status: None   Collection Time: 11/10/17  6:25 PM  Result Value Ref Range Status   C Diff antigen NEGATIVE NEGATIVE Final   C Diff toxin NEGATIVE NEGATIVE Final   C Diff interpretation No C. difficile detected.  Final    Comment: Performed at [facility], [place] [address], [place], [place] [address]    Radiology Reports X-ray Chest Pa And Lateral  Result Date: 11/10/2017 CLINICAL DATA:  Cough, chest congestion, and shortness of breath which have worsened over the past 3 days. EXAM: CHEST - 2 VIEW COMPARISON:  Chest x-ray of April 29th 2019 and chest CT scan of today's date FINDINGS: The right lung is mildly hypoinflated. The hemidiaphragm is elevated with respect to the left. There is bibasilar interstitial density. There is no large pleural effusion. The heart is normal in size. The pulmonary vascularity is not engorged. IMPRESSION: Bibasilar atelectasis or pneumonia. Elevation of the right hemidiaphragm. Electronically Signed   By: David  Martinique M.D.   On: 11/10/2017 10:06   Dg  Chest 2 View  Result Date: 11/09/2017 CLINICAL DATA:  Cough, nasal congestion, fever EXAM: CHEST - 2 VIEW COMPARISON:  09/09/2017 FINDINGS: Large hiatal hernia. Stable elevation of the right hemidiaphragm with right base atelectasis. Left lung clear. No effusions. No acute bony abnormality. Multilevel vertebroplasty changes throughout the thoracic and lumbar spine. IMPRESSION: Stable elevation of the right hemidiaphragm with right base atelectasis. Large hiatal hernia. Electronically Signed   By: Rolm Baptise M.D.   On: 11/09/2017 08:41   Ct Angio Chest Pe W Or Wo Contrast  Result Date: 11/10/2017 CLINICAL DATA:  Shortness of breath.  History of multiple myeloma EXAM: CT ANGIOGRAPHY CHEST WITH CONTRAST TECHNIQUE: Multidetector CT imaging of the chest was performed using the standard protocol during bolus administration of intravenous contrast. Multiplanar CT image reconstructions and MIPs were obtained to evaluate the vascular anatomy. CONTRAST:  123m ISOVUE-370 IOPAMIDOL (ISOVUE-370) INJECTION 76% COMPARISON:  Chest radiograph November 09, 2017; thoracic MRI July 30, 2016 FINDINGS: Cardiovascular: There is no demonstrable pulmonary embolus. There is no thoracic aortic aneurysm or dissection. The visualized great vessels appear normal. There is no appreciable pericardial effusion or pericardial thickening evident. Mediastinum/Nodes: Thyroid appears unremarkable. There is no appreciable thoracic adenopathy. There is a sizable hiatal type hernia. Lungs/Pleura: There is patchy consolidation with associated atelectasis in the right lower lobe, primarily in the posterior and to a lesser extent medial segments of the right lower lobe. There is patchy atelectasis with probable consolidation in a portion of the posterior segment of the left lower lobe. There is airspace consolidation in the apical segment of the right upper lobe. There is no parenchymal lung mass or nodular lesion. No appreciable pleural effusion or  pleural thickening. Upper Abdomen: There is diffuse hepatic steatosis. There is reflux of contrast into the inferior vena cava and portal veins. Visualized upper abdominal structures otherwise appear unremarkable. Musculoskeletal: There is elevation of the right hemidiaphragm. There are lytic bone lesions throughout essentially all visualized vertebral bodies. The patient is undergone multiple kyphoplasty  procedures. There is marked collapse of the T7 and T9 vertebral bodies with mild retropulsion of bone into the canal at the T9 level causing moderate spinal stenosis. Mild anterior wedging at T5 is stable compared to prior MR. Other areas of fracture have undergone kyphoplasty type procedures. There are small lytic lesions in each scapula. Review of the MIP images confirms the above findings. IMPRESSION: 1. No demonstrable pulmonary embolus. Thoracic aortic aneurysm or dissection. 2. Areas of atelectasis with associated consolidation felt to represent pneumonia in each lower lobe. Focal infiltrate apical segment right upper lobe. 3.  No appreciable adenopathy. 4.  Stable fairly sizable hiatal hernia. 5. Extensive bony changes of multiple myeloma. Patient has undergone multiple kyphoplasty type procedures in the thoracic and lumbar regions. Note that there is retropulsion of bone at T9 causing moderate spinal stenosis, a finding also present on prior thoracic MR from January 2018 and unchanged. 6.  Hepatic steatosis. 7.  Stable elevation of the right hemidiaphragm. Electronically Signed   By: Lowella Grip III M.D.   On: 11/10/2017 09:15    Time Spent in minutes  30   Jani Gravel M.D on 11/11/2017 at 8:03 AM  Between 7am to 7pm - Pager - [phone]  After 7pm go to [email or web address] - password [facility]  [facility] -  Office  [phone]

## 2017-11-11 NOTE — Progress Notes (Signed)
  Echocardiogram 2D Echocardiogram has been performed.  Jannett Celestine 11/11/2017, 11:28 AM

## 2017-11-11 NOTE — Progress Notes (Signed)
Looks like Mr. Hepburn has a parainfluenza virus.  His respiratory panel came back positive for parainfluenza 3.  I would think this makes sense.  He did have a CT angiogram of the chest yesterday.  This was negative for any pulmonary emboli.  Looks like he did have another temperature spike.  His white cell count went down.  This certainly would go along with a viral infection.  Today, his white cell count was 1.9.  Platelet count is 91,000.  I still think that his immune system probably has some weakness.  It has  been 15 months since he had his stem cell transplant.  I would think that his white cell count will recover.  I think any type of infection is going to affect his blood counts more significantly than if he did not have a transplant.  He does have some wheezing.  I think he is on some nebulizer therapy.  There is been no bleeding.  He has had some diarrhea.  His C. difficile is negative.  He has had no nausea or vomiting.  On his physical exam, he is afebrile.  There really is no change in his physical exam.  He does have some wheezing that is more prominent in the left upper lung.  I am not sure there is any intervention that can be done for this parainfluenza 3 virus.  I would think that it just has to run its course.  I would believe supportive care will be all as necessary.  With his white cell count trending up a little bit, I do not think we have to give him Neupogen right now.  I know that he is getting fantastic care from all the staff up on [address].  Lattie Haw, MD  [place] 3:18

## 2017-11-11 NOTE — Progress Notes (Signed)
Pharmacy Antibiotic Note  Jorge [name] is a 61 y.o. male multiple myeloma s/p stem cell transplant  on Revlimid PTA, presented to the ED on 11/09/2017 with c/o cough, running nose and fever.  Vancomycin and cefepime started on admission for PNA. 1/4 bcx bottle collected on 4/29 is positive for enterococcus species.  ID recom. to continue with vancomycin and cefepime for now and to pursue further w/u if more bcx bottles are positive.  Today, 11/11/2017: - Tmax 101.1 - wbc low, ANC 2.6 on 4/29 - scr 0.86 (crcl~84)  5/1 VT at 0900= 8 (on 1gm IV q12h)        Pk at 1206 = 24       Ke= 0.1221, t1/2= 5.7, Vd=45, AUC 363   Plan: - increase vancomycin to 1250 mg IV q12h for est AUC 452 (goal 400-500) - continue cefepime 1gm IV q8h - f/u renal function and cultures  ____________________________________  Height: 5' 7" (170.2 cm) Weight: 170 lb (77.1 kg) IBW/kg (Calculated) : 66.1  Temp (24hrs), Avg:99.3 F (37.4 C), Min:97.7 F (36.5 C), Max:101.1 F (38.4 C)  Recent Labs  Lab 11/09/17 0827 11/09/17 0834 11/10/17 0703 11/11/17 0627  WBC 3.6*  --  1.7* 1.9*  CREATININE 0.92  --  1.00 0.86  LATICACIDVEN  --  1.54  --   --     Estimated Creatinine Clearance: 84.3 mL/min (by C-G formula based on SCr of 0.86 mg/dL).    No Known Allergies  Antimicrobials this admission:  4/29 Cefepime>> 4/29 vanc>>  Dose adjustments this admission:  5/1 VT at 0900= 8 (on 1gm IV q12h)        Pk at 1206 = 24       Ke= 0.1221, t1/2= 5.7, Vd=45, AUC 363       Change vanc to 1250 mg IV q12h for AUC 452  Microbiology results:  4/29 BCx x2: 1/4 GPC in chains, BCID= enterococcus species 4/30 cdiff pcr: neg 4/30 resp panel pcr: parainfluenza   Thank you for allowing pharmacy to be a part of this patient's care.  Pham, Anh P 11/11/2017 7:56 AM 

## 2017-11-12 DIAGNOSIS — J129 Viral pneumonia, unspecified: Secondary | ICD-10-CM

## 2017-11-12 LAB — CBC
HCT: 31.2 % — ABNORMAL LOW (ref 39.0–52.0)
HEMOGLOBIN: 9.9 g/dL — AB (ref 13.0–17.0)
MCH: 30.6 pg (ref 26.0–34.0)
MCHC: 31.7 g/dL (ref 30.0–36.0)
MCV: 96.3 fL (ref 78.0–100.0)
Platelets: 101 10*3/uL — ABNORMAL LOW (ref 150–400)
RBC: 3.24 MIL/uL — ABNORMAL LOW (ref 4.22–5.81)
RDW: 14.4 % (ref 11.5–15.5)
WBC: 2.3 10*3/uL — AB (ref 4.0–10.5)

## 2017-11-12 LAB — COMPREHENSIVE METABOLIC PANEL
ALBUMIN: 3.2 g/dL — AB (ref 3.5–5.0)
ALT: 50 U/L (ref 17–63)
ANION GAP: 10 (ref 5–15)
AST: 43 U/L — AB (ref 15–41)
Alkaline Phosphatase: 41 U/L (ref 38–126)
BUN: 12 mg/dL (ref 6–20)
CO2: 26 mmol/L (ref 22–32)
Calcium: 8.3 mg/dL — ABNORMAL LOW (ref 8.9–10.3)
Chloride: 105 mmol/L (ref 101–111)
Creatinine, Ser: 0.79 mg/dL (ref 0.61–1.24)
GFR calc Af Amer: >60 mL/min (ref >60)
GFR calc non Af Amer: >60 mL/min (ref >60)
Glucose, Bld: 99 mg/dL (ref 65–99)
POTASSIUM: 3.2 mmol/L — AB (ref 3.5–5.1)
SODIUM: 141 mmol/L (ref 135–145)
Total Bilirubin: 0.8 mg/dL (ref 0.3–1.2)
Total Protein: 6.4 g/dL — ABNORMAL LOW (ref 6.5–8.1)

## 2017-11-12 LAB — CULTURE, BLOOD (ROUTINE X 2): Special Requests: ADEQUATE

## 2017-11-12 MED ORDER — POTASSIUM CHLORIDE CRYS ER 10 MEQ PO TBCR
40.0000 meq | EXTENDED_RELEASE_TABLET | Freq: Once | ORAL | Status: AC
  Administered 2017-11-12: 40 meq via ORAL
  Filled 2017-11-12: qty 4

## 2017-11-12 NOTE — Progress Notes (Deleted)
CRITICAL VALUE ALERT  Critical Value:  K- 2.6  Date & Time Notied:  5/2 0732  Provider Notified: MD paged  Orders Received/Actions taken: Awaiting orders and will carry them out

## 2017-11-12 NOTE — Progress Notes (Signed)
Ms. Oyster is feeling a little bit better.  He actually did grow enterococcus in his blood culture.  This was 1 out of 4 blood cultures.  It came back positive yesterday.  He was seen by infectious disease.  They felt this was a contaminant.  As such, no antibiotics are being given for this.  He did have a echocardiogram done yesterday.  This appeared to be normal.  There is no vegetations.  His cardiac ejection fraction was 55-60%.  His blood counts are improving.  His white cell count was 2.3.  Hemoglobin 9.9.  Platelet count 101K.  He does have a low potassium of 3.2.  I think that the viral pneumonia appears to be improving.  He is not wheezing as much.  He feels like his breathing is getting better.  He has had no fever.  Looks like his temperature curve is coming down.  This morning, his temperature is 97.8.  His blood pressure is 121/86.  Hopefully, he will be able to go home today or tomorrow at the latest.  Lattie Haw, MD  [place] 4:1

## 2017-11-12 NOTE — Progress Notes (Signed)
Patient ID: Jorge [name], male   DOB: [birth year], 62 y.o.   MRN: [id number]                                                                PROGRESS NOTE                                                                                                                                                                                                             Patient Demographics:    Jorge [name], is a 62 y.o. male, DOB - [birth year], FUX:[id number]  Admit date - 11/09/2017   Admitting Physician Modena Jansky, MD  Outpatient Primary MD for the patient is Saguier, Iris Pert  LOS - 3  Outpatient Specialists:    Chief Complaint  Patient presents with  . Cough  . Fever       Brief Narrative    62 year old married male, independent and physically quite active, PMH of multiple myeloma on Revlimid, bony metastasis, chronic back pain related to same, history of DVT for which she completed a full dose anticoagulation and currently is on prophylactic dose Xarelto for the last year, anxiety, depression, GERD, presented to [place] with above complaints. He was in his usual state of health until 2 days ago when he developed nonproductive cough which progressively got worse until last night he was coughing continuously, associated dyspnea, wheezing, this morning noted headache and presented to the ED where noted to have high fever of 102.8 F. He has chronic runny nose for the last couple of months. No earache or sore throat reported. Appetite decreased.  ED Course:Chest x-ray showed right hemi-diaphragm elevation with right base atelectasis and a large hiatal hernia but no obvious pneumonia. Urine microscopy negative for UTI features. Started on IV fluids and empirically on IV cefepime and vancomycin for suspected developing pneumonia. Transferred to [facility].     Subjective:    Jorge [name] today feeling better. Afebrile,  Off vanco, cefepime since yesterday,   Enterococcus thought to be contaminant,  ID thinks pneumonia viral.    No headache, No chest pain, No abdominal pain - No Nausea, No new weakness tingling or numbness, No  SOB.    Assessment  & Plan :    Principal Problem:   Sepsis ([place])  Active Problems:   Multiple myeloma (HCC)   Viral pneumonia   Thrombocytopenia (HCC)   Bacteremia   Sepsis (HCC)with enterococcal bacteremia, parainfluenza -he met sepsis criteria at the time of admission with fevers, dyspnea, leukocytosis, likely due to developing pneumonia. BCIDgrowing enterococcus - IDconsulted, appreciate input,  -Cardiac echo=> EF wnl, no vegetation OFF Abx since 5/1 per ID  Active Problems: Metastatic multiple myeloma (HCC) -Seen byDr Marin Olp, Revlimid currently on hold due to acute sepsisand pneumonia  HCAP (healthcare-associated pneumonia)bilateral lower lobe  OFF  IV vancomycin and cefepime since 5/1 -CT chest showed no PE, pneumonia in each lower lobe, focal infiltrates apical segment right upper lobe. Extensive bony changes of multiple myeloma  Thrombocytopenia ([place]), pancytopenia -Likely due to chemo medications, follow counts closely  History of DVT -Completed full therapeutic dose of Xarelto, continue prophylactic Xarelto  Anxiety, depression -Continue Zoloft  Abnormal liver function Check cmp in am  Code Status:Full code DVT Prophylaxis:Xarelto Family Communication:Discussed in detail with the patient, all imaging results, lab results explained to the patient ,  Home tomorrow if stable    Anti-infectives (From admission, onward)   Start     Dose/Rate Route Frequency Ordered Stop   11/11/17 2000  vancomycin (VANCOCIN) 1,250 mg in sodium chloride 0.9 % 250 mL IVPB  Status:  Discontinued     1,250 mg 166.7 mL/hr over 90 Minutes Intravenous Every 12 hours 11/11/17 1310 11/11/17 1948   11/09/17 2000  famciclovir (FAMVIR) tablet 500 mg     500 mg Oral Daily 11/09/17 1814     11/09/17  1700  ceFEPIme (MAXIPIME) 1 g in sodium chloride 0.9 % 100 mL IVPB  Status:  Discontinued     1 g 200 mL/hr over 30 Minutes Intravenous Every 8 hours 11/09/17 0900 11/11/17 1840   11/09/17 1000  vancomycin (VANCOCIN) IVPB 1000 mg/200 mL premix  Status:  Discontinued     1,000 mg 200 mL/hr over 60 Minutes Intravenous Every 12 hours 11/09/17 0900 11/11/17 1310   11/09/17 0926  ceFEPIme (MAXIPIME) 1 g injection    Note to Pharmacy:  Lurene Shadow   : cabinet override      11/09/17 0926 11/09/17 0934   11/09/17 0900  ceFEPIme (MAXIPIME) 1 g in sodium chloride 0.9 % 100 mL IVPB     1 g 200 mL/hr over 30 Minutes Intravenous  Once 11/09/17 0855 11/09/17 1134        Objective:   Vitals:   11/11/17 1921 11/11/17 2019 11/12/17 0506 11/12/17 0656  BP:  136/79 121/86   Pulse:  88 75   Resp:  18 18   Temp:  99.4 F (37.4 C) 97.8 F (36.6 C)   TempSrc:  Oral Oral   SpO2: 95% 94% 99%   Weight:    79.1 kg (174 lb 6.1 oz)  Height:        Wt Readings from Last 3 Encounters:  11/12/17 79.1 kg (174 lb 6.1 oz)  09/30/17 77.6 kg (171 lb)  08/18/17 76.7 kg (169 lb)     Intake/Output Summary (Last 24 hours) at 11/12/2017 0812 Last data filed at 11/11/2017 2123 Gross per 24 hour  Intake 220 ml  Output -  Net 220 ml     Physical Exam  Awake Alert, Oriented X 3, No new F.N deficits, Normal affect [place].AT,PERRAL Supple Neck,No JVD, No cervical lymphadenopathy appriciated.  Symmetrical Chest wall movement, Good air movement bilaterally, CTAB RRR,No Gallops,Rubs or new Murmurs, No Parasternal Heave +ve B.Sounds, Abd Soft,  No tenderness, No organomegaly appriciated, No rebound - guarding or rigidity. No Cyanosis, Clubbing or edema, No new Rash or bruise      Data Review:    CBC Recent Labs  Lab 11/09/17 0827 11/10/17 0703 11/11/17 0627 11/12/17 0603  WBC 3.6* 1.7* 1.9* 2.3*  HGB 12.0* 10.3* 10.0* 9.9*  HCT 36.5* 33.3* 32.3* 31.2*  PLT 137* 96* 91* 101*  MCV 95.1 98.8 97.9 96.3  MCH  31.3 30.6 30.3 30.6  MCHC 32.9 30.9 31.0 31.7  RDW 13.9 14.5 14.2 14.4  LYMPHSABS 0.5*  --   --   --   MONOABS 0.4  --   --   --   EOSABS 0.1  --   --   --   BASOSABS 0.0  --   --   --     Chemistries  Recent Labs  Lab 11/09/17 0827 11/10/17 0703 11/11/17 0627 11/12/17 0603  NA 135 140 142 141  K 3.8 4.0 4.2 3.2*  CL 101 107 108 105  CO2 _0 GLUCOSE 127* 110* 104* 99  BUN _1 CREATININE 0.92 1.00 0.86 0.79  CALCIUM 8.7* 8.0* 8.5* 8.3*  AST 47*  --   --  43*  ALT 46  --   --  50  ALKPHOS 60  --   --  41  BILITOT 0.6  --   --  0.8   ------------------------------------------------------------------------------------------------------------------ No results for input(s): CHOL, HDL, LDLCALC, TRIG, CHOLHDL, LDLDIRECT in the last 72 hours.  Lab Results  Component Value Date   HGBA1C 5.1 05/14/2016   ------------------------------------------------------------------------------------------------------------------ No results for input(s): TSH, T4TOTAL, T3FREE, THYROIDAB in the last 72 hours.  Invalid input(s): FREET3 ------------------------------------------------------------------------------------------------------------------ No results for input(s): VITAMINB12, FOLATE, FERRITIN, TIBC, IRON, RETICCTPCT in the last 72 hours.  Coagulation profile Recent Labs  Lab 11/09/17 0827  INR 1.24    No results for input(s): DDIMER in the last 72 hours.  Cardiac Enzymes No results for input(s): CKMB, TROPONINI, MYOGLOBIN in the last 168 hours.  Invalid input(s): CK ------------------------------------------------------------------------------------------------------------------ No results found for: BNP  Inpatient Medications  Scheduled Meds: . acidophilus  1 capsule Oral Daily  . albuterol  2.5 mg Nebulization TID  . benzonatate  200 mg Oral TID  . cholecalciferol  1,000 Units Oral Daily  . famciclovir  500 mg Oral Daily  . famotidine  20 mg Oral  Daily  . magnesium oxide  400 mg Oral Daily  . potassium chloride  40 mEq Oral Once  . rivaroxaban  10 mg Oral QPM  . sertraline  100 mg Oral QHS  . sodium chloride flush  3 mL Intravenous Q12H   Continuous Infusions: PRN Meds:.acetaminophen **OR** acetaminophen, albuterol, guaiFENesin-dextromethorphan, ketorolac, loratadine, ondansetron **OR** ondansetron (ZOFRAN) IV, polyethylene glycol, zolpidem  Micro Results Recent Results (from the past 240 hour(s))  Culture, blood (Routine x 2)     Status: None (Preliminary result)   Collection Time: 11/09/17  8:20 AM  Result Value Ref Range Status   Specimen Description   Final    BLOOD RIGHT ARM Performed at [facility], [address] [place], [place] [address]    Special Requests   Final    BOTTLES DRAWN AEROBIC AND ANAEROBIC Blood Culture adequate volume Performed at [facility], [address] [place], [place] [address]    Culture   Final    NO GROWTH 2 DAYS Performed at [facility], [address] [place], [place] [address]    Report Status PENDING  Incomplete  Culture, blood (Routine x 2)     Status: Abnormal   Collection Time: 11/09/17  8:40 AM  Result Value Ref Range Status   Specimen Description   Final    BLOOD RIGHT HAND Performed at [facility], [address] [place], [place] [address]    Special Requests   Final    BOTTLES DRAWN AEROBIC AND ANAEROBIC Blood Culture adequate volume Performed at [facility], [address] [place], [place] [address]    Culture  Setup Time   Final    ANAEROBIC BOTTLE ONLY GRAM POSITIVE COCCI IN PAIRS IN CHAINS CRITICAL RESULT CALLED TO, READ BACK BY AND VERIFIED WITH: D. Wofford PharmD 10:10 11/10/17 ([email or web address]) Performed at [facility], [place] [address], [place], [place] [address]    Culture ENTEROCOCCUS FAECALIS (A)  Final   Report Status 11/12/2017 FINAL  Final   Organism ID, Bacteria ENTEROCOCCUS FAECALIS  Final       Susceptibility   Enterococcus faecalis - MIC*    AMPICILLIN <=2 SENSITIVE Sensitive     VANCOMYCIN 2 SENSITIVE Sensitive     GENTAMICIN SYNERGY SENSITIVE Sensitive     * ENTEROCOCCUS FAECALIS  Blood Culture ID Panel (Reflexed)     Status: Abnormal   Collection Time: 11/09/17  8:40 AM  Result Value Ref Range Status   Enterococcus species DETECTED (A) NOT DETECTED Final    Comment: CRITICAL RESULT CALLED TO, READ BACK BY AND VERIFIED WITH: D. Wofford PharmD 10:10 11/10/17 ([email or web address])    Vancomycin resistance NOT DETECTED NOT DETECTED Final   Listeria monocytogenes NOT DETECTED NOT DETECTED Final   Staphylococcus species NOT DETECTED NOT DETECTED Final   Staphylococcus aureus NOT DETECTED NOT DETECTED Final   Methicillin resistance NOT DETECTED NOT DETECTED Final   Streptococcus species NOT DETECTED NOT DETECTED Final   Streptococcus agalactiae NOT DETECTED NOT DETECTED Final   Streptococcus pneumoniae NOT DETECTED NOT DETECTED Final   Streptococcus pyogenes NOT DETECTED NOT DETECTED Final   Acinetobacter baumannii NOT DETECTED NOT DETECTED Final   Enterobacteriaceae species NOT DETECTED NOT DETECTED Final   Enterobacter cloacae complex NOT DETECTED NOT DETECTED Final   Escherichia coli NOT DETECTED NOT DETECTED Final   Klebsiella oxytoca NOT DETECTED NOT DETECTED Final   Klebsiella pneumoniae NOT DETECTED NOT DETECTED Final   Proteus species NOT DETECTED NOT DETECTED Final   Serratia marcescens NOT DETECTED NOT DETECTED Final   Carbapenem resistance NOT DETECTED NOT DETECTED Final   Haemophilus influenzae NOT DETECTED NOT DETECTED Final   Neisseria meningitidis NOT DETECTED NOT DETECTED Final   Pseudomonas aeruginosa NOT DETECTED NOT DETECTED Final   Candida albicans NOT DETECTED NOT DETECTED Final   Candida glabrata NOT DETECTED NOT DETECTED Final   Candida krusei NOT DETECTED NOT DETECTED Final   Candida parapsilosis NOT DETECTED NOT DETECTED Final   Candida tropicalis NOT DETECTED  NOT DETECTED Final  Respiratory Panel by PCR     Status: Abnormal   Collection Time: 11/10/17  1:31 PM  Result Value Ref Range Status   Adenovirus NOT DETECTED NOT DETECTED Final   Coronavirus 229E NOT DETECTED NOT DETECTED Final   Coronavirus HKU1 NOT DETECTED NOT DETECTED Final   Coronavirus NL63 NOT DETECTED NOT DETECTED Final   Coronavirus OC43 NOT DETECTED NOT DETECTED Final   Metapneumovirus NOT DETECTED NOT DETECTED Final   Rhinovirus / Enterovirus NOT DETECTED NOT DETECTED  Final   Influenza A NOT DETECTED NOT DETECTED Final   Influenza B NOT DETECTED NOT DETECTED Final   Parainfluenza Virus 1 NOT DETECTED NOT DETECTED Final   Parainfluenza Virus 2 NOT DETECTED NOT DETECTED Final   Parainfluenza Virus 3 DETECTED (A) NOT DETECTED Final   Parainfluenza Virus 4 NOT DETECTED NOT DETECTED Final   Respiratory Syncytial Virus NOT DETECTED NOT DETECTED Final   Bordetella pertussis NOT DETECTED NOT DETECTED Final   Chlamydophila pneumoniae NOT DETECTED NOT DETECTED Final   Mycoplasma pneumoniae NOT DETECTED NOT DETECTED Final    Comment: Performed at [facility], [place] [address], [place], [place] [address]  C difficile quick scan w PCR reflex     Status: None   Collection Time: 11/10/17  6:25 PM  Result Value Ref Range Status   C Diff antigen NEGATIVE NEGATIVE Final   C Diff toxin NEGATIVE NEGATIVE Final   C Diff interpretation No C. difficile detected.  Final    Comment: Performed at [facility], [place] [address], [place], [place] [address]    Radiology Reports X-ray Chest Pa And Lateral  Result Date: 11/10/2017 CLINICAL DATA:  Cough, chest congestion, and shortness of breath which have worsened over the past 3 days. EXAM: CHEST - 2 VIEW COMPARISON:  Chest x-ray of April 29th 2019 and chest CT scan of today's date FINDINGS: The right lung is mildly hypoinflated. The hemidiaphragm is elevated with respect to the left. There is bibasilar interstitial density.  There is no large pleural effusion. The heart is normal in size. The pulmonary vascularity is not engorged. IMPRESSION: Bibasilar atelectasis or pneumonia. Elevation of the right hemidiaphragm. Electronically Signed   By: David  Martinique M.D.   On: 11/10/2017 10:06   Dg Chest 2 View  Result Date: 11/09/2017 CLINICAL DATA:  Cough, nasal congestion, fever EXAM: CHEST - 2 VIEW COMPARISON:  09/09/2017 FINDINGS: Large hiatal hernia. Stable elevation of the right hemidiaphragm with right base atelectasis. Left lung clear. No effusions. No acute bony abnormality. Multilevel vertebroplasty changes throughout the thoracic and lumbar spine. IMPRESSION: Stable elevation of the right hemidiaphragm with right base atelectasis. Large hiatal hernia. Electronically Signed   By: Rolm Baptise M.D.   On: 11/09/2017 08:41   Ct Angio Chest Pe W Or Wo Contrast  Result Date: 11/10/2017 CLINICAL DATA:  Shortness of breath.  History of multiple myeloma EXAM: CT ANGIOGRAPHY CHEST WITH CONTRAST TECHNIQUE: Multidetector CT imaging of the chest was performed using the standard protocol during bolus administration of intravenous contrast. Multiplanar CT image reconstructions and MIPs were obtained to evaluate the vascular anatomy. CONTRAST:  184m ISOVUE-370 IOPAMIDOL (ISOVUE-370) INJECTION 76% COMPARISON:  Chest radiograph November 09, 2017; thoracic MRI July 30, 2016 FINDINGS: Cardiovascular: There is no demonstrable pulmonary embolus. There is no thoracic aortic aneurysm or dissection. The visualized great vessels appear normal. There is no appreciable pericardial effusion or pericardial thickening evident. Mediastinum/Nodes: Thyroid appears unremarkable. There is no appreciable thoracic adenopathy. There is a sizable hiatal type hernia. Lungs/Pleura: There is patchy consolidation with associated atelectasis in the right lower lobe, primarily in the posterior and to a lesser extent medial segments of the right lower lobe. There is  patchy atelectasis with probable consolidation in a portion of the posterior segment of the left lower lobe. There is airspace consolidation in the apical segment of the right upper lobe. There is no parenchymal lung mass or nodular lesion. No appreciable pleural effusion or pleural thickening. Upper Abdomen: There is  diffuse hepatic steatosis. There is reflux of contrast into the inferior vena cava and portal veins. Visualized upper abdominal structures otherwise appear unremarkable. Musculoskeletal: There is elevation of the right hemidiaphragm. There are lytic bone lesions throughout essentially all visualized vertebral bodies. The patient is undergone multiple kyphoplasty procedures. There is marked collapse of the T7 and T9 vertebral bodies with mild retropulsion of bone into the canal at the T9 level causing moderate spinal stenosis. Mild anterior wedging at T5 is stable compared to prior MR. Other areas of fracture have undergone kyphoplasty type procedures. There are small lytic lesions in each scapula. Review of the MIP images confirms the above findings. IMPRESSION: 1. No demonstrable pulmonary embolus. Thoracic aortic aneurysm or dissection. 2. Areas of atelectasis with associated consolidation felt to represent pneumonia in each lower lobe. Focal infiltrate apical segment right upper lobe. 3.  No appreciable adenopathy. 4.  Stable fairly sizable hiatal hernia. 5. Extensive bony changes of multiple myeloma. Patient has undergone multiple kyphoplasty type procedures in the thoracic and lumbar regions. Note that there is retropulsion of bone at T9 causing moderate spinal stenosis, a finding also present on prior thoracic MR from January 2018 and unchanged. 6.  Hepatic steatosis. 7.  Stable elevation of the right hemidiaphragm. Electronically Signed   By: Lowella Grip III M.D.   On: 11/10/2017 09:15    Time Spent in minutes  30   Jani Gravel M.D on 11/12/2017 at 8:12 AM  Between 7am to 7pm - Pager -  [phone]   After 7pm go to [email or web address] - password [email or web address]  [facility] -  Office  [phone]

## 2017-11-13 ENCOUNTER — Telehealth: Payer: Self-pay | Admitting: *Deleted

## 2017-11-13 DIAGNOSIS — A408 Other streptococcal sepsis: Secondary | ICD-10-CM

## 2017-11-13 DIAGNOSIS — J111 Influenza due to unidentified influenza virus with other respiratory manifestations: Secondary | ICD-10-CM

## 2017-11-13 LAB — CBC
HEMATOCRIT: 30 % — AB (ref 39.0–52.0)
HEMOGLOBIN: 9.5 g/dL — AB (ref 13.0–17.0)
MCH: 30.3 pg (ref 26.0–34.0)
MCHC: 31.7 g/dL (ref 30.0–36.0)
MCV: 95.5 fL (ref 78.0–100.0)
Platelets: 107 10*3/uL — ABNORMAL LOW (ref 150–400)
RBC: 3.14 MIL/uL — ABNORMAL LOW (ref 4.22–5.81)
RDW: 14.7 % (ref 11.5–15.5)
WBC: 2.1 10*3/uL — ABNORMAL LOW (ref 4.0–10.5)

## 2017-11-13 LAB — COMPREHENSIVE METABOLIC PANEL
ALBUMIN: 3.1 g/dL — AB (ref 3.5–5.0)
ALK PHOS: 43 U/L (ref 38–126)
ALT: 57 U/L (ref 17–63)
ANION GAP: 8 (ref 5–15)
AST: 43 U/L — ABNORMAL HIGH (ref 15–41)
BUN: 12 mg/dL (ref 6–20)
CALCIUM: 8.4 mg/dL — AB (ref 8.9–10.3)
CO2: 23 mmol/L (ref 22–32)
CREATININE: 0.75 mg/dL (ref 0.61–1.24)
Chloride: 110 mmol/L (ref 101–111)
GFR calc Af Amer: >60 mL/min (ref >60)
GFR calc non Af Amer: >60 mL/min (ref >60)
GLUCOSE: 97 mg/dL (ref 65–99)
Potassium: 3.5 mmol/L (ref 3.5–5.1)
SODIUM: 141 mmol/L (ref 135–145)
Total Bilirubin: 0.4 mg/dL (ref 0.3–1.2)
Total Protein: 5.9 g/dL — ABNORMAL LOW (ref 6.5–8.1)

## 2017-11-13 MED ORDER — ALBUTEROL SULFATE HFA 108 (90 BASE) MCG/ACT IN AERS: 2.0000 | INHALATION_SPRAY | Freq: Four times a day (QID) | RESPIRATORY_TRACT | 2 refills | Status: DC | PRN

## 2017-11-13 NOTE — Discharge Summary (Addendum)
Jorge [name], is a 62 y.o. male  DOB [birth year]  MRN [id number].  Admission date:  11/09/2017  Admitting Physician  Modena Jansky, MD  Discharge Date:  11/13/2017   Primary MD  Saguier, Percell Miller, PA-C  Recommendations for primary care physician for things to follow:   Viral pneumonia Parainfluenza 3 Pt is doing well, afebrile Ventolin HFA 2puff q6h prn  Enterococcal bacteremia Thought to be contamination Cardiac echo 5/1=> EF wnl, no vegetation   Active Problems: Metastatic multiple myeloma ([place]) -Seen byDr Marin Olp, Revlimid currently on hold due to acute sepsisand pneumonia, pt will follow up with Dr. Marin Olp to resume  HCAP (healthcare-associated pneumonia)bilateral lower lobe  initially treated w vanco, cefepime iv, now thought to be viral -CT chest showed no PE, pneumonia in each lower lobe, focal infiltrates apical segment right upper lobe. Extensive bony changes of multiple myeloma   Thrombocytopenia (HCC), pancytopenia Please f/u with oncology or pcp in 1 week to check cbc   History of DVT -Completed full therapeutic dose of Xarelto, continue prophylactic Xarelto  Anxiety, depression -Continue Zoloft  Abnormal liver function Please check cmp in 1 week with oncology or pcp      Admission Diagnosis  HCAP (healthcare-associated pneumonia) [J18.9] Fever, unspecified fever cause [R50.9] Multiple myeloma, remission status unspecified ([place]) [C90.00]   Discharge Diagnosis  HCAP (healthcare-associated pneumonia) [J18.9] Fever, unspecified fever cause [R50.9] Multiple myeloma, remission status unspecified ([place]) [C90.00]      Principal Problem:   Sepsis ([place]) Active Problems:   Multiple myeloma ([place])   HCAP (healthcare-associated pneumonia)   Thrombocytopenia ([place])   Bacteremia      Past Medical History:  Diagnosis Date  . Anxiety   . Bone metastasis ([place])    . Chest cold 05/19/2016   productive cough  -- started on antibiotic  . Chronic back pain    due to bone mets from myeloma  . Cough   . Depression   . GERD (gastroesophageal reflux disease)   . Hiatal hernia   . History of chicken pox   . History of concussion    age 18 -- no residual  . History of DVT of lower extremity 03/21/2016  treated and completed w/ xarelto   per doppler left extensive occlusion common femoral, femoral, and popliteal veins and right partial occlusion common femoral and profunda femoral veins/  last doppler 06-04-2016 no evidence acute or chronic dvt noted either leg   . History of radiation therapy 06/09/16-06/23/16   lower thoracic spine 25 Gy in 10 fractions  . Mouth ulcers    secondary to radiation  . Multiple myeloma ([place]) dx 02/22/2016 via bone marrow bx---  oncologist-  [name]   IgG Kappa-- Hyperdiploid/ +11 w/ bone mets--  current treatment chemotherapy (started 08/ 2017)and pallitive radiation to back started 06-09-2016  . Renal calculus, right   . Wears contact lenses     Past Surgical History:  Procedure Laterality Date  . COLONOSCOPY  [id number]  . [place]  Right 06/20/2016   Procedure: CYSTOSCOPY WITH STENT REPLACEMENT;  Surgeon: Kathie Rhodes, MD;  Location: [facility] ([facility]);  Service: Urology;  Laterality: Right;  . CYSTOSCOPY WITH RETROGRADE PYELOGRAM, URETEROSCOPY AND STENT PLACEMENT Right 05/30/2016   Procedure: CYSTOSCOPY WITH RETROGRADE PYELOGRAM, URETEROSCOPY AND STENT PLACEMENT,DILITATION URETERAL STRICTURE;  Surgeon: Kathie Rhodes, MD;  Location: [facility];  Service: Urology;  Laterality: Right;  . CYSTOSCOPY/RETROGRADE/URETEROSCOPY/STONE EXTRACTION WITH BASKET Right 06/20/2016   Procedure: CYSTOSCOPY/URETEROSCOPY/STONE EXTRACTION WITH BASKET;  Surgeon: Kathie Rhodes, MD;  Location: [facility];  Service: Urology;  Laterality: Right;  . HOLMIUM LASER APPLICATION Right 29/11/2839    Procedure: HOLMIUM LASER APPLICATION;  Surgeon: Kathie Rhodes, MD;  Location: [facility];  Service: Urology;  Laterality: Right;  . IR GENERIC HISTORICAL  02/11/2016   IR RADIOLOGIST EVAL & MGMT 02/11/2016 [facility]  . IR GENERIC HISTORICAL  02/15/2016   IR BONE TUMOR(S)RF ABLATION 02/15/2016 Luanne Bras, MD [facility]  . IR GENERIC HISTORICAL  02/15/2016   IR BONE TUMOR(S)RF ABLATION 02/15/2016 Luanne Bras, MD [facility]  . IR GENERIC HISTORICAL  02/15/2016   IR BONE TUMOR(S)RF ABLATION 02/15/2016 Luanne Bras, MD [facility]  . IR GENERIC HISTORICAL  02/15/2016   IR KYPHO THORACIC WITH BONE BIOPSY 02/15/2016 Luanne Bras, MD [facility]  . IR GENERIC HISTORICAL  02/15/2016   IR KYPHO THORACIC WITH BONE BIOPSY 02/15/2016 Luanne Bras, MD [facility]  . IR GENERIC HISTORICAL  02/15/2016   IR VERTEBROPLASTY CERV/THOR BX INC UNI/BIL INC/INJECT/IMAGING 02/15/2016 Luanne Bras, MD [facility]  . IR GENERIC HISTORICAL  03/13/2016   IR KYPHO EA ADDL LEVEL THORACIC OR LUMBAR 03/13/2016 Luanne Bras, MD [facility]  . IR GENERIC HISTORICAL  03/13/2016   IR KYPHO EA ADDL LEVEL THORACIC OR LUMBAR 03/13/2016 Luanne Bras, MD [facility]  . IR GENERIC HISTORICAL  03/13/2016   IR BONE TUMOR(S)RF ABLATION 03/13/2016 Luanne Bras, MD [facility]  . IR GENERIC HISTORICAL  03/13/2016   IR KYPHO LUMBAR INC FX REDUCE BONE BX UNI/BIL CANNULATION INC/IMAGING 03/13/2016 Luanne Bras, MD [facility]  . IR GENERIC HISTORICAL  03/13/2016   IR BONE TUMOR(S)RF ABLATION 03/13/2016 Luanne Bras, MD [facility]  . IR GENERIC HISTORICAL  03/13/2016   IR BONE TUMOR(S)RF ABLATION 03/13/2016 Luanne Bras, MD [facility]  . IR GENERIC HISTORICAL  03/31/2016   IR RADIOLOGIST EVAL & MGMT 03/31/2016 [facility]  . LAPAROSCOPIC INGUINAL HERNIA REPAIR Bilateral 12-16-2013  dr gross  . RADIOLOGY WITH ANESTHESIA N/A 02/15/2016   Procedure: Spinal Ablation;   Surgeon: Luanne Bras, MD;  Location: [place];  Service: Radiology;  Laterality: N/A;  . RADIOLOGY WITH ANESTHESIA N/A 03/13/2016   Procedure: LUMBER ABLATION;  Surgeon: Luanne Bras, MD;  Location: [place];  Service: Radiology;  Laterality: N/A;  . ROTATOR CUFF REPAIR Right 2003  . TONSILLECTOMY  age 14  . WISDOM TOOTH EXTRACTION         HPI  from the history and physical done on the day of admission:    62 year old married male, independent and physically quite active, PMH of multiple myeloma on Revlimid, bony metastasis, chronic back pain related to same, history of DVT for which she completed a full dose anticoagulation and currently is on prophylactic dose Xarelto for the last year, anxiety, depression, GERD, presented to [place] with above complaints. He was in his usual state of health until 2 days ago when he developed nonproductive cough which progressively got worse until last  night he was coughing continuously, associated dyspnea, wheezing, this morning noted headache and presented to the ED where noted to have high fever of 102.8 F. He has chronic runny nose for the last couple of months. No earache or sore throat reported. Appetite decreased.  ED Course:Chest x-ray showed right hemi-diaphragm elevation with right base atelectasis and a large hiatal hernia but no obvious pneumonia. Urine microscopy negative for UTI features. Started on IV fluids and empirically on IV cefepime and vancomycin for suspected developing pneumonia. Transferred to [facility] Course:     Pt was admitted on 4/29 and started on vanco and cefepime iv for suspected hcap.  Flu pcr negative. Blood culture 1/4 enterococcus.  ID consulted and initially recommended continuing vanco, but later this was thought to be secondary to contamination.  Viral pathogen panel on 4/30=> parainfluenza 3.  vanco and cefepime were discontinued on 5/1 as his pneumonia  thought to be due to viral etiology.  Cardiac echo 5/1=> negative for vegetation.  Pt has been afebrile for the past 24-48 hours and breathing normally.  Pt has been pancytopenic but this has been stable. Pt feels stable and will be discharged to home.    Follow UP  Follow-up Information    [name], MD Follow up in 1 week(s).   Specialty:  Oncology Contact information: [address] [place] [address] [phone]        Saguier, Percell Miller, PA-C Follow up in 1 week(s).   Specialties:  Internal Medicine, Family Medicine Contact information: [address] [place] [address] [phone]            Consults obtained - ID, oncology  Discharge Condition: stable  Diet and Activity recommendation: See Discharge Instructions below  Discharge Instructions          Discharge Medications     Allergies as of 11/13/2017   No Known Allergies     Medication List    TAKE these medications   albuterol 108 (90 Base) MCG/ACT inhaler Commonly known as:  PROVENTIL HFA;VENTOLIN HFA Inhale 2 puffs into the lungs every 6 (six) hours as needed for wheezing or shortness of breath.   amoxicillin 500 MG tablet Commonly known as:  AMOXIL Take one hour before dental procedure What changed:    how much to take  how to take this  when to take this  additional instructions   benzonatate 200 MG capsule Commonly known as:  TESSALON Take 1 capsule (200 mg total) by mouth 3 (three) times daily as needed for cough.   cetirizine 10 MG tablet Commonly known as:  ZYRTEC Take 10 mg by mouth daily as needed for allergies.   cholecalciferol 1000 units tablet Commonly known as:  VITAMIN D Take 1,000 Units by mouth daily.   famciclovir 500 MG tablet Commonly known as:  FAMVIR Take 1 tablet (500 mg total) by mouth daily.   lenalidomide 10 MG capsule Commonly known as:  REVLIMID TAKE 1 CAPSULE BY MOUTH EVERY DAY FOR 21 DAYS, THEN 7 DAYS  OFF [id number]   lidocaine 5 % Commonly known as:  LIDODERM PLAEC 1 PATCH ONTO THE SKIN DAILY as needed for pain. APPLY 1 PATCH TO THE MOST PAINFUL AREA FOR 12 HR IN A 24 HR PERIOD   magnesium oxide 400 MG tablet Commonly known as:  MAG-OX Take 400 mg by mouth daily.   ondansetron 8 MG  disintegrating tablet Commonly known as:  ZOFRAN-ODT Take 1 tablet (8 mg total) by mouth every 8 (eight) hours as needed. What changed:  reasons to take this   OVER THE COUNTER MEDICATION Take 1 tablet by mouth daily.   polyethylene glycol powder powder Commonly known as:  MIRALAX Take 17 g by mouth daily. What changed:    when to take this  reasons to take this   PROBIOTIC PO Take 1 capsule by mouth daily.   rOPINIRole 0.25 MG tablet Commonly known as:  REQUIP TAKE 1 TABLET BY MOUTH 1 TO 3 HOURS BEFORE BED FOR 2 DAYS THEN INCREASE TO 2 TABS What changed:  See the new instructions.   senna 8.6 MG Tabs tablet Commonly known as:  SENOKOT Take 2 tablets (17.2 mg total) by mouth daily. What changed:    how much to take  when to take this  reasons to take this   sertraline 100 MG tablet Commonly known as:  ZOLOFT Take 100 mg by mouth at bedtime.   XARELTO 10 MG Tabs tablet Generic drug:  rivaroxaban Take 1 tablet (10 mg total) by mouth daily. What changed:  when to take this   zolpidem 10 MG tablet Commonly known as:  AMBIEN Take 1 tablet (10 mg total) by mouth at bedtime as needed. What changed:    how much to take  reasons to take this   ZOMETA IV Inject 4 mg into the vein every 3 (three) months. Receives at [name] office.       Major procedures and Radiology Reports - PLEASE review detailed and final reports for all details, in brief -       X-ray Chest Pa And Lateral  Result Date: 11/10/2017 CLINICAL DATA:  Cough, chest congestion, and shortness of breath which have worsened over the past 3 days. EXAM: CHEST - 2 VIEW COMPARISON:  Chest x-ray of April  29th 2019 and chest CT scan of today's date FINDINGS: The right lung is mildly hypoinflated. The hemidiaphragm is elevated with respect to the left. There is bibasilar interstitial density. There is no large pleural effusion. The heart is normal in size. The pulmonary vascularity is not engorged. IMPRESSION: Bibasilar atelectasis or pneumonia. Elevation of the right hemidiaphragm. Electronically Signed   By: David  Martinique M.D.   On: 11/10/2017 10:06   Dg Chest 2 View  Result Date: 11/09/2017 CLINICAL DATA:  Cough, nasal congestion, fever EXAM: CHEST - 2 VIEW COMPARISON:  09/09/2017 FINDINGS: Large hiatal hernia. Stable elevation of the right hemidiaphragm with right base atelectasis. Left lung clear. No effusions. No acute bony abnormality. Multilevel vertebroplasty changes throughout the thoracic and lumbar spine. IMPRESSION: Stable elevation of the right hemidiaphragm with right base atelectasis. Large hiatal hernia. Electronically Signed   By: Rolm Baptise M.D.   On: 11/09/2017 08:41   Ct Angio Chest Pe W Or Wo Contrast  Result Date: 11/10/2017 CLINICAL DATA:  Shortness of breath.  History of multiple myeloma EXAM: CT ANGIOGRAPHY CHEST WITH CONTRAST TECHNIQUE: Multidetector CT imaging of the chest was performed using the standard protocol during bolus administration of intravenous contrast. Multiplanar CT image reconstructions and MIPs were obtained to evaluate the vascular anatomy. CONTRAST:  134m ISOVUE-370 IOPAMIDOL (ISOVUE-370) INJECTION 76% COMPARISON:  Chest radiograph November 09, 2017; thoracic MRI July 30, 2016 FINDINGS: Cardiovascular: There is no demonstrable pulmonary embolus. There is no thoracic aortic aneurysm or dissection. The visualized great vessels appear normal. There is no appreciable pericardial effusion or pericardial thickening evident.  Mediastinum/Nodes: Thyroid appears unremarkable. There is no appreciable thoracic adenopathy. There is a sizable hiatal type hernia.  Lungs/Pleura: There is patchy consolidation with associated atelectasis in the right lower lobe, primarily in the posterior and to a lesser extent medial segments of the right lower lobe. There is patchy atelectasis with probable consolidation in a portion of the posterior segment of the left lower lobe. There is airspace consolidation in the apical segment of the right upper lobe. There is no parenchymal lung mass or nodular lesion. No appreciable pleural effusion or pleural thickening. Upper Abdomen: There is diffuse hepatic steatosis. There is reflux of contrast into the inferior vena cava and portal veins. Visualized upper abdominal structures otherwise appear unremarkable. Musculoskeletal: There is elevation of the right hemidiaphragm. There are lytic bone lesions throughout essentially all visualized vertebral bodies. The patient is undergone multiple kyphoplasty procedures. There is marked collapse of the T7 and T9 vertebral bodies with mild retropulsion of bone into the canal at the T9 level causing moderate spinal stenosis. Mild anterior wedging at T5 is stable compared to prior MR. Other areas of fracture have undergone kyphoplasty type procedures. There are small lytic lesions in each scapula. Review of the MIP images confirms the above findings. IMPRESSION: 1. No demonstrable pulmonary embolus. Thoracic aortic aneurysm or dissection. 2. Areas of atelectasis with associated consolidation felt to represent pneumonia in each lower lobe. Focal infiltrate apical segment right upper lobe. 3.  No appreciable adenopathy. 4.  Stable fairly sizable hiatal hernia. 5. Extensive bony changes of multiple myeloma. Patient has undergone multiple kyphoplasty type procedures in the thoracic and lumbar regions. Note that there is retropulsion of bone at T9 causing moderate spinal stenosis, a finding also present on prior thoracic MR from January 2018 and unchanged. 6.  Hepatic steatosis. 7.  Stable elevation of the right  hemidiaphragm. Electronically Signed   By: Lowella Grip III M.D.   On: 11/10/2017 09:15    Micro Results      Recent Results (from the past 240 hour(s))  Culture, blood (Routine x 2)     Status: None (Preliminary result)   Collection Time: 11/09/17  8:20 AM  Result Value Ref Range Status   Specimen Description   Final    BLOOD RIGHT ARM Performed at [facility], [address] [place], [place] [address]    Special Requests   Final    BOTTLES DRAWN AEROBIC AND ANAEROBIC Blood Culture adequate volume Performed at [facility], [address] [place], [place] [address]    Culture   Final    NO GROWTH 3 DAYS Performed at [facility], [place] [address] [place], [place] [address]    Report Status PENDING  Incomplete  Culture, blood (Routine x 2)     Status: Abnormal   Collection Time: 11/09/17  8:40 AM  Result Value Ref Range Status   Specimen Description   Final    BLOOD RIGHT HAND Performed at [facility], [address] [place], [place] [address]    Special Requests   Final    BOTTLES DRAWN AEROBIC AND ANAEROBIC Blood Culture adequate volume Performed at [facility], [address] [place], [place] [address]    Culture  Setup Time   Final    ANAEROBIC BOTTLE ONLY GRAM POSITIVE COCCI IN PAIRS IN CHAINS CRITICAL RESULT CALLED TO, READ BACK BY AND VERIFIED WITH: D. Wofford PharmD 10:10 11/10/17 ([email or web address]) Performed at  [facility], [place] [address], [place], [place] [address]    Culture ENTEROCOCCUS FAECALIS (A)  Final   Report Status 11/12/2017 FINAL  Final   Organism ID, Bacteria ENTEROCOCCUS FAECALIS  Final      Susceptibility   Enterococcus faecalis - MIC*    AMPICILLIN <=2 SENSITIVE Sensitive     VANCOMYCIN 2 SENSITIVE Sensitive     GENTAMICIN SYNERGY SENSITIVE Sensitive     * ENTEROCOCCUS FAECALIS  Blood Culture ID Panel (Reflexed)     Status: Abnormal   Collection Time: 11/09/17  8:40 AM  Result Value  Ref Range Status   Enterococcus species DETECTED (A) NOT DETECTED Final    Comment: CRITICAL RESULT CALLED TO, READ BACK BY AND VERIFIED WITH: D. Wofford PharmD 10:10 11/10/17 ([email or web address])    Vancomycin resistance NOT DETECTED NOT DETECTED Final   Listeria monocytogenes NOT DETECTED NOT DETECTED Final   Staphylococcus species NOT DETECTED NOT DETECTED Final   Staphylococcus aureus NOT DETECTED NOT DETECTED Final   Methicillin resistance NOT DETECTED NOT DETECTED Final   Streptococcus species NOT DETECTED NOT DETECTED Final   Streptococcus agalactiae NOT DETECTED NOT DETECTED Final   Streptococcus pneumoniae NOT DETECTED NOT DETECTED Final   Streptococcus pyogenes NOT DETECTED NOT DETECTED Final   Acinetobacter baumannii NOT DETECTED NOT DETECTED Final   Enterobacteriaceae species NOT DETECTED NOT DETECTED Final   Enterobacter cloacae complex NOT DETECTED NOT DETECTED Final   Escherichia coli NOT DETECTED NOT DETECTED Final   Klebsiella oxytoca NOT DETECTED NOT DETECTED Final   Klebsiella pneumoniae NOT DETECTED NOT DETECTED Final   Proteus species NOT DETECTED NOT DETECTED Final   Serratia marcescens NOT DETECTED NOT DETECTED Final   Carbapenem resistance NOT DETECTED NOT DETECTED Final   Haemophilus influenzae NOT DETECTED NOT DETECTED Final   Neisseria meningitidis NOT DETECTED NOT DETECTED Final   Pseudomonas aeruginosa NOT DETECTED NOT DETECTED Final   Candida albicans NOT DETECTED NOT DETECTED Final   Candida glabrata NOT DETECTED NOT DETECTED Final   Candida krusei NOT DETECTED NOT DETECTED Final   Candida parapsilosis NOT DETECTED NOT DETECTED Final   Candida tropicalis NOT DETECTED NOT DETECTED Final  Respiratory Panel by PCR     Status: Abnormal   Collection Time: 11/10/17  1:31 PM  Result Value Ref Range Status   Adenovirus NOT DETECTED NOT DETECTED Final   Coronavirus 229E NOT DETECTED NOT DETECTED Final   Coronavirus HKU1 NOT DETECTED NOT DETECTED Final   Coronavirus NL63  NOT DETECTED NOT DETECTED Final   Coronavirus OC43 NOT DETECTED NOT DETECTED Final   Metapneumovirus NOT DETECTED NOT DETECTED Final   Rhinovirus / Enterovirus NOT DETECTED NOT DETECTED Final   Influenza A NOT DETECTED NOT DETECTED Final   Influenza B NOT DETECTED NOT DETECTED Final   Parainfluenza Virus 1 NOT DETECTED NOT DETECTED Final   Parainfluenza Virus 2 NOT DETECTED NOT DETECTED Final   Parainfluenza Virus 3 DETECTED (A) NOT DETECTED Final   Parainfluenza Virus 4 NOT DETECTED NOT DETECTED Final   Respiratory Syncytial Virus NOT DETECTED NOT DETECTED Final   Bordetella pertussis NOT DETECTED NOT DETECTED Final   Chlamydophila pneumoniae NOT DETECTED NOT DETECTED Final   Mycoplasma pneumoniae NOT DETECTED NOT DETECTED Final    Comment: Performed at [facility], [address], [place], [place] [address]  C difficile quick scan w PCR reflex     Status: None   Collection Time: 11/10/17  6:25 PM  Result Value Ref Range Status  C Diff antigen NEGATIVE NEGATIVE Final   C Diff toxin NEGATIVE NEGATIVE Final   C Diff interpretation No C. difficile detected.  Final    Comment: Performed at [facility], [place] [address], [place], [place] [address]       Today   Subjective    Jorge [name] today has no headache,no chest pain, no sob, no abdominal pain,no new weakness tingling or numbness, feels much better wants to go home today.   Objective   Blood pressure 130/87, pulse 67, temperature 98.1 F (36.7 C), temperature source Oral, resp. rate 20, height 5' 7" (1.702 m), weight 79.1 kg (174 lb 6.1 oz), SpO2 97 %.  No intake or output data in the 24 hours ending 11/13/17 0633  Exam Awake Alert, Oriented x 3, No new F.N deficits, Normal affect [place].AT,PERRAL Supple Neck,No JVD, No cervical lymphadenopathy appriciated.  Symmetrical Chest wall movement, Good air movement bilaterally, CTAB RRR,No Gallops,Rubs or new Murmurs, No Parasternal Heave +ve  B.Sounds, Abd Soft, Non tender, No organomegaly appriciated, No rebound -guarding or rigidity. No Cyanosis, Clubbing or edema, No new Rash or bruise   Data Review   CBC w Diff:  Lab Results  Component Value Date   WBC 2.3 (L) 11/12/2017   HGB 9.9 (L) 11/12/2017   HGB 12.6 (L) 09/30/2017   HGB 12.3 (L) 06/30/2017   HCT 31.2 (L) 11/12/2017   HCT 36.9 (L) 06/30/2017   PLT 101 (L) 11/12/2017   PLT 164 09/30/2017   PLT 158 06/30/2017   LYMPHOPCT 14 11/09/2017   LYMPHOPCT 36.7 06/30/2017   BANDSPCT 15 03/01/2017   MONOPCT 11 11/09/2017   MONOPCT 19.9 (H) 06/30/2017   EOSPCT 1 11/09/2017   EOSPCT 5.0 06/30/2017   BASOPCT 0 11/09/2017   BASOPCT 1.1 06/30/2017    CMP:  Lab Results  Component Value Date   NA 141 11/12/2017   NA 143 06/30/2017   NA 140 10/03/2016   K 3.2 (L) 11/12/2017   K 4.0 06/30/2017   K 4.3 10/03/2016   CL 105 11/12/2017   CL 105 06/30/2017   CO2 26 11/12/2017   CO2 27 06/30/2017   CO2 26 10/03/2016   BUN 12 11/12/2017   BUN 13 06/30/2017   BUN 11.9 10/03/2016   CREATININE 0.79 11/12/2017   CREATININE 1.10 09/30/2017   CREATININE 1.0 06/30/2017   CREATININE 0.9 10/03/2016   PROT 6.4 (L) 11/12/2017   PROT 6.5 06/30/2017   PROT 6.1 06/30/2017   PROT 6.5 10/03/2016   ALBUMIN 3.2 (L) 11/12/2017   ALBUMIN 3.7 06/30/2017   ALBUMIN 4.1 10/03/2016   BILITOT 0.8 11/12/2017   BILITOT 0.6 09/30/2017   BILITOT 0.48 10/03/2016   ALKPHOS 41 11/12/2017   ALKPHOS 57 06/30/2017   ALKPHOS 80 10/03/2016   AST 43 (H) 11/12/2017   AST 39 (H) 09/30/2017   AST 28 10/03/2016   ALT 50 11/12/2017   ALT 39 09/30/2017   ALT 43 06/30/2017   ALT 26 10/03/2016  .   Total Time in preparing paper work, data evaluation and todays exam - 72 minutes  Jani Gravel M.D on 11/13/2017 at 6:33 AM  [facility]   Office  [phone]

## 2017-11-13 NOTE — Care Management Note (Signed)
Case Management Note  Patient Details  Name: Jorge [name] MRN: [id number] Date of Birth: [birth year]  Subjective/Objective:                  discharge  Action/Plan: Date: Nov 13, 2017 Chart review for discharge needs:  None found for case management. Patient has no questions concerning post hospital care. Velva Harman, BSN, [place], [place]  Expected Discharge Date:  11/13/17               Expected Discharge Plan:  Home/Self Care  In-House Referral:     Discharge planning Services  CM Consult  Post Acute Care Choice:    Choice offered to:     DME Arranged:    DME Agency:     HH Arranged:    HH Agency:     Status of Service:  Completed, signed off  If discussed at [employer] of Stay Meetings, dates discussed:    Additional Comments:  Leeroy Cha, RN 11/13/2017, 9:36 AM

## 2017-11-13 NOTE — Progress Notes (Signed)
Looks like Mr. Hence will be going home today.  He feels well.  His breathing seems to be doing quite well.  I do not detect any wheezing.  His oxygen saturation is 97%.  I suspect that he had this parainfluenza 3 virus as a main culprit.  I do not think that the enterococcus in his blood was a true pathogen.  His white cell count is stable.  It is 2.1.  His hemoglobin is 9.5.  Blood count 107.  He has had no fever.  He is eating well.  He is out of bed.  He has had a one bout of diarrhea.  There is no bleeding.  On his physical exam, his vital signs show temperature 98.1.  Pulse 67.  Blood pressure 130/87.  Head neck exam shows no oral lesions.  He has no scleral icterus.  There is no adenopathy in the neck.  Lungs are clear.  Is good air movement bilaterally.  I do not hear any wheezing.  Cardiac exam regular rate and rhythm.  He has no murmurs.  Abdomen is soft.  Bowel sounds are active.  There is no guarding or rebound tenderness.  There is no splenomegaly.  Extremities shows no clubbing, cyanosis or edema.  He will go home today.  He will follow-up with his family doctor.  We will be more than happy to see him.  I think that his appointment for [place] probably scheduled sometime in June.  I very much appreciate the outstanding care that he got from everybody up on 5 E.  Lattie Haw, MD  [facility] 6:3

## 2017-11-13 NOTE — Telephone Encounter (Signed)
Patient recently discharged from hospital. He is supposed to be on his last week of Revlimid. He has not restarted since getting home and he'd like to know what he should do.  Spoke to [name]. He would like patient to hold for 2 weeks and then restart.  Patient is aware of instructions.

## 2017-11-13 NOTE — Progress Notes (Signed)
Discharge instructions and medications discussed with patient.  Prescription and AVS given to patient.  All questions answered.  

## 2017-11-14 LAB — CULTURE, BLOOD (ROUTINE X 2)
Culture: NO GROWTH
SPECIAL REQUESTS: ADEQUATE

## 2017-11-18 ENCOUNTER — Inpatient Hospital Stay: Payer: 59 | Attending: Hematology & Oncology | Admitting: Family

## 2017-11-18 ENCOUNTER — Inpatient Hospital Stay: Payer: 59

## 2017-11-18 ENCOUNTER — Other Ambulatory Visit: Payer: Self-pay

## 2017-11-18 ENCOUNTER — Encounter: Payer: Self-pay | Admitting: Family

## 2017-11-18 VITALS — BP 109/68 | HR 63 | Temp 97.8°F | Resp 18 | Wt 170.0 lb

## 2017-11-18 DIAGNOSIS — Z7901 Long term (current) use of anticoagulants: Secondary | ICD-10-CM | POA: Diagnosis not present

## 2017-11-18 DIAGNOSIS — I82403 Acute embolism and thrombosis of unspecified deep veins of lower extremity, bilateral: Secondary | ICD-10-CM | POA: Insufficient documentation

## 2017-11-18 DIAGNOSIS — C9000 Multiple myeloma not having achieved remission: Secondary | ICD-10-CM | POA: Diagnosis not present

## 2017-11-18 LAB — CBC WITH DIFFERENTIAL (CANCER CENTER ONLY)
Basophils Absolute: 0 10*3/uL (ref 0.0–0.1)
Basophils Relative: 1 %
EOS ABS: 0.1 10*3/uL (ref 0.0–0.5)
EOS PCT: 4 %
HCT: 32.1 % — ABNORMAL LOW (ref 38.7–49.9)
Hemoglobin: 10.2 g/dL — ABNORMAL LOW (ref 13.0–17.1)
LYMPHS ABS: 0.9 10*3/uL (ref 0.9–3.3)
LYMPHS PCT: 41 %
MCH: 30.2 pg (ref 28.0–33.4)
MCHC: 31.8 g/dL — AB (ref 32.0–35.9)
MCV: 95 fL (ref 82.0–98.0)
MONO ABS: 0.4 10*3/uL (ref 0.1–0.9)
MONOS PCT: 16 %
Neutro Abs: 0.8 10*3/uL — ABNORMAL LOW (ref 1.5–6.5)
Neutrophils Relative %: 38 %
PLATELETS: 210 10*3/uL (ref 145–400)
RBC: 3.38 MIL/uL — ABNORMAL LOW (ref 4.20–5.70)
RDW: 13.8 % (ref 11.1–15.7)
WBC Count: 2.2 10*3/uL — ABNORMAL LOW (ref 4.0–10.0)

## 2017-11-18 LAB — CMP (CANCER CENTER ONLY)
ALT: 53 U/L — AB (ref 10–47)
ANION GAP: 4 — AB (ref 5–15)
AST: 32 U/L (ref 11–38)
Albumin: 3.4 g/dL — ABNORMAL LOW (ref 3.5–5.0)
Alkaline Phosphatase: 49 U/L (ref 26–84)
BUN: 13 mg/dL (ref 7–22)
CHLORIDE: 108 mmol/L (ref 98–108)
CO2: 27 mmol/L (ref 18–33)
Calcium: 9.5 mg/dL (ref 8.0–10.3)
Creatinine: 0.9 mg/dL (ref 0.60–1.20)
GLUCOSE: 92 mg/dL (ref 73–118)
Potassium: 4.1 mmol/L (ref 3.3–4.7)
SODIUM: 139 mmol/L (ref 128–145)
TOTAL PROTEIN: 6.7 g/dL (ref 6.4–8.1)
Total Bilirubin: 0.7 mg/dL (ref 0.2–1.6)

## 2017-11-18 NOTE — Progress Notes (Signed)
Hematology and Oncology Follow Up Visit  Jorge [name] [id number] 02-01-1956 62 y.o. 11/18/2017   Principle Diagnosis:  IgG Kappa myeloma - Hyperdiploid/+11 DVT of the LEFT and RIGHT leg   Current Therapy:   Revlimid 10 mg po q day (21 on/7 off) - on hold, will restart next week  Zometa 4 mg IV q 3 months- next dose is 12/2017 Xarelto 10 mg PO daily   Interim History:  [name] is here today with his wife for follow-up. He is feeling much better. His sinus congestions has resolved. He still has a cough with clear/yellow phlegm. Lung sounds are clear throughout.  He has some SOB with over exertion at times. He was able to walk 3 miles yesterday for exercise.  His back pain is much improved now that he is back home and able to move around. He rates it currently at a 2/10.  He is still holding his Revlimid.  No fever, chills, n/v, rash, dizziness, chest pain, palpitations, abdominal pain or changes in bladder habits.  He was having 3-4 episodes of diarrhea daily but since starting imodium 2 days ago this seems to have resolved. No swelling, tenderness, numbness or tingling in his extremities.  Last month, M-spike was 0.2, IgG 739 mg/dL and kappa light chains 2.37 mg/dL.  No episodes of bleeding, no bruising or petechiae. No lymphadenopathy noted on exam.  His appetite seems to be better. He is hydrating well and his weight is stable.   ECOG Performance Status: 1 - Symptomatic but completely ambulatory  Medications:  Allergies as of 11/18/2017   No Known Allergies     Medication List        Accurate as of 11/18/17  8:54 AM. Always use your most recent med list.          albuterol 108 (90 Base) MCG/ACT inhaler Commonly known as:  PROVENTIL HFA;VENTOLIN HFA Inhale 2 puffs into the lungs every 6 (six) hours as needed for wheezing or shortness of breath.   amoxicillin 500 MG tablet Commonly known as:  AMOXIL Take one hour before dental procedure   benzonatate 200 MG  capsule Commonly known as:  TESSALON Take 1 capsule (200 mg total) by mouth 3 (three) times daily as needed for cough.   cetirizine 10 MG tablet Commonly known as:  ZYRTEC Take 10 mg by mouth daily as needed for allergies.   cholecalciferol 1000 units tablet Commonly known as:  VITAMIN D Take 1,000 Units by mouth daily.   famciclovir 500 MG tablet Commonly known as:  FAMVIR Take 1 tablet (500 mg total) by mouth daily.   lenalidomide 10 MG capsule Commonly known as:  REVLIMID TAKE 1 CAPSULE BY MOUTH EVERY DAY FOR 21 DAYS, THEN 7 DAYS OFF [id number]   lidocaine 5 % Commonly known as:  LIDODERM PLAEC 1 PATCH ONTO THE SKIN DAILY as needed for pain. APPLY 1 PATCH TO THE MOST PAINFUL AREA FOR 12 HR IN A 24 HR PERIOD   magnesium oxide 400 MG tablet Commonly known as:  MAG-OX Take 400 mg by mouth daily.   ondansetron 8 MG disintegrating tablet Commonly known as:  ZOFRAN-ODT Take 1 tablet (8 mg total) by mouth every 8 (eight) hours as needed.   OVER THE COUNTER MEDICATION Take 1 tablet by mouth daily.   polyethylene glycol powder powder Commonly known as:  MIRALAX Take 17 g by mouth daily.   PROBIOTIC PO Take 1 capsule by mouth daily.   rOPINIRole 0.25 MG tablet Commonly known  as:  REQUIP TAKE 1 TABLET BY MOUTH 1 TO 3 HOURS BEFORE BED FOR 2 DAYS THEN INCREASE TO 2 TABS   senna 8.6 MG Tabs tablet Commonly known as:  SENOKOT Take 2 tablets (17.2 mg total) by mouth daily.   sertraline 100 MG tablet Commonly known as:  ZOLOFT Take 100 mg by mouth at bedtime.   XARELTO 10 MG Tabs tablet Generic drug:  rivaroxaban Take 1 tablet (10 mg total) by mouth daily.   zolpidem 10 MG tablet Commonly known as:  AMBIEN Take 1 tablet (10 mg total) by mouth at bedtime as needed.   ZOMETA IV Inject 4 mg into the vein every 3 (three) months. Receives at [name] office.       Allergies: No Known Allergies  Past Medical History, Surgical history, Social history, and  Family History were reviewed and updated.  Review of Systems: All other 10 point review of systems is negative.   Physical Exam:  vitals were not taken for this visit.   Wt Readings from Last 3 Encounters:  11/12/17 174 lb 6.1 oz (79.1 kg)  09/30/17 171 lb (77.6 kg)  08/18/17 169 lb (76.7 kg)    Ocular: Sclerae unicteric, pupils equal, round and reactive to light Ear-nose-throat: Oropharynx clear, dentition fair Lymphatic: No cervical, supraclavicular or axillary adenopathy Lungs no rales or rhonchi, good excursion bilaterally Heart regular rate and rhythm, no murmur appreciated Abd soft, nontender, positive bowel sounds, no liver or spleen tip palpated on exam, no fluid wave  MSK no focal spinal tenderness, no joint edema Neuro: non-focal, well-oriented, appropriate affect Breasts: Deferred   Lab Results  Component Value Date   WBC 2.2 (L) 11/18/2017   HGB 10.2 (L) 11/18/2017   HCT 32.1 (L) 11/18/2017   MCV 95.0 11/18/2017   PLT 210 11/18/2017   Lab Results  Component Value Date   FERRITIN 37 08/18/2017   IRON 106 08/18/2017   TIBC 366 08/18/2017   UIBC 260 08/18/2017   IRONPCTSAT 29 (L) 08/18/2017   Lab Results  Component Value Date   RETICCTPCT 1.1 03/05/2017   RBC 3.38 (L) 11/18/2017   Lab Results  Component Value Date   KPAFRELGTCHN 23.7 (H) 09/30/2017   LAMBDASER 10.4 09/30/2017   KAPLAMBRATIO 2.28 (H) 09/30/2017   Lab Results  Component Value Date   IGGSERUM 739 09/30/2017   IGA 186 09/30/2017   IGMSERUM 24 09/30/2017   Lab Results  Component Value Date   TOTALPROTELP 6.2 09/30/2017   ALBUMINELP 3.5 09/30/2017   A1GS 0.2 09/30/2017   A2GS 0.5 09/30/2017   BETS 1.1 09/30/2017   GAMS 0.8 09/30/2017   MSPIKE 0.2 (H) 09/30/2017     Chemistry      Component Value Date/Time   NA 141 11/13/2017 0622   NA 143 06/30/2017 1049   NA 140 10/03/2016 0845   K 3.5 11/13/2017 0622   K 4.0 06/30/2017 1049   K 4.3 10/03/2016 0845   CL 110 11/13/2017  0622   CL 105 06/30/2017 1049   CO2 23 11/13/2017 0622   CO2 27 06/30/2017 1049   CO2 26 10/03/2016 0845   BUN 12 11/13/2017 0622   BUN 13 06/30/2017 1049   BUN 11.9 10/03/2016 0845   CREATININE 0.75 11/13/2017 0622   CREATININE 1.10 09/30/2017 1306   CREATININE 1.0 06/30/2017 1049   CREATININE 0.9 10/03/2016 0845      Component Value Date/Time   CALCIUM 8.4 (L) 11/13/2017 0622   CALCIUM 9.0 06/30/2017 1049  CALCIUM 9.9 10/03/2016 0845   ALKPHOS 43 11/13/2017 0622   ALKPHOS 57 06/30/2017 1049   ALKPHOS 80 10/03/2016 0845   AST 43 (H) 11/13/2017 0622   AST 39 (H) 09/30/2017 1306   AST 28 10/03/2016 0845   ALT 57 11/13/2017 0622   ALT 39 09/30/2017 1306   ALT 43 06/30/2017 1049   ALT 26 10/03/2016 0845   BILITOT 0.4 11/13/2017 0622   BILITOT 0.6 09/30/2017 1306   BILITOT 0.48 10/03/2016 0845      Impression and Plan: Jorge [name] is a pleasant 62 yo gentleman with IgG kappa myeloma and recent bout with pneumonia requiring hospitalization. He has recuperated nicely and has no complaints at this time.  I spoke with Dr. Marin Olp and we will have him resume his Revlimid next week. Her verbalized understanding.  Myeloma studies or today are pending.  We will plan to see him back in late June when he is due again for Xgeva.  They will contact our office with any questions or concerns. We can certainly see him sooner if need be.   Laverna Peace, NP 5/8/20198:54 AM

## 2017-11-19 LAB — IGG, IGA, IGM
IGA: 208 mg/dL (ref 61–437)
IGM (IMMUNOGLOBULIN M), SRM: 29 mg/dL (ref 20–172)
IgG (Immunoglobin G), Serum: 755 mg/dL (ref 700–1600)

## 2017-11-19 LAB — KAPPA/LAMBDA LIGHT CHAINS
KAPPA FREE LGHT CHN: 24 mg/L — AB (ref 3.3–19.4)
Kappa, lambda light chain ratio: 1.95 — ABNORMAL HIGH (ref 0.26–1.65)
Lambda free light chains: 12.3 mg/L (ref 5.7–26.3)

## 2017-11-23 LAB — PROTEIN ELECTROPHORESIS, SERUM, WITH REFLEX
A/G Ratio: 1.1 (ref 0.7–1.7)
ALBUMIN ELP: 3.3 g/dL (ref 2.9–4.4)
ALPHA-1-GLOBULIN: 0.4 g/dL (ref 0.0–0.4)
ALPHA-2-GLOBULIN: 0.9 g/dL (ref 0.4–1.0)
BETA GLOBULIN: 1.1 g/dL (ref 0.7–1.3)
Gamma Globulin: 0.7 g/dL (ref 0.4–1.8)
Globulin, Total: 3.1 g/dL (ref 2.2–3.9)
M-Spike, %: 0.2 g/dL — ABNORMAL HIGH
SPEP INTERP: 0
Total Protein ELP: 6.4 g/dL (ref 6.0–8.5)

## 2017-11-23 LAB — IMMUNOFIXATION REFLEX, SERUM
IgA: 213 mg/dL (ref 61–437)
IgG (Immunoglobin G), Serum: 760 mg/dL (ref 700–1600)
IgM (Immunoglobulin M), Srm: 28 mg/dL (ref 20–172)

## 2017-11-30 ENCOUNTER — Telehealth: Payer: Self-pay | Admitting: *Deleted

## 2017-11-30 NOTE — Telephone Encounter (Signed)
Patient restarted his revlimid last week, and almost immediately started having severe muscle cramping to his back and neck. He has had this symptom before. The cramping got so severe that he held his revlimid starting on Sunday, May 19th. He'd like to know how to move forward.  [name] is out of the office until Wednesday. Instructed the patient to continue holding the Revlimid until it could be discussed with [name]. Patient will hold Revlimid and we will reach back out this Wednesday. Patient agreeable with plan.

## 2017-12-02 ENCOUNTER — Telehealth: Payer: Self-pay | Admitting: *Deleted

## 2017-12-02 NOTE — Telephone Encounter (Signed)
Reviewed patient's side effects while on Revlimid with [name].  [name] would like patient to restart Revlimid. He would like patient to take the revlimid at night along with his magnesium and see if it's better tolerated.   Returned call to patient, but was unable to make contact. Detailed message left on personal voice mail.

## 2017-12-03 DIAGNOSIS — M19011 Primary osteoarthritis, right shoulder: Secondary | ICD-10-CM | POA: Diagnosis not present

## 2017-12-08 ENCOUNTER — Telehealth: Payer: Self-pay | Admitting: *Deleted

## 2017-12-08 NOTE — Telephone Encounter (Signed)
Patient will be headed to [place] in July. He plans on staying in a location which [place]. He wants to make sure this is okay.  Reviewed with [name]. He is okay with this travel. Patient aware of Dr Antonieta Pert agreement.

## 2017-12-15 ENCOUNTER — Other Ambulatory Visit: Payer: Self-pay | Admitting: *Deleted

## 2017-12-15 DIAGNOSIS — M545 Low back pain, unspecified: Secondary | ICD-10-CM

## 2017-12-15 DIAGNOSIS — M7989 Other specified soft tissue disorders: Secondary | ICD-10-CM

## 2017-12-15 DIAGNOSIS — G8929 Other chronic pain: Secondary | ICD-10-CM

## 2017-12-15 DIAGNOSIS — C9000 Multiple myeloma not having achieved remission: Secondary | ICD-10-CM

## 2017-12-15 DIAGNOSIS — D472 Monoclonal gammopathy: Secondary | ICD-10-CM

## 2017-12-15 DIAGNOSIS — I8290 Acute embolism and thrombosis of unspecified vein: Secondary | ICD-10-CM

## 2017-12-15 MED ORDER — LENALIDOMIDE 10 MG PO CAPS: ORAL_CAPSULE | ORAL | 0 refills | Status: DC

## 2017-12-16 ENCOUNTER — Encounter: Payer: Self-pay | Admitting: Physical Therapy

## 2017-12-16 ENCOUNTER — Ambulatory Visit: Payer: 59 | Attending: Orthopedic Surgery | Admitting: Physical Therapy

## 2017-12-16 ENCOUNTER — Other Ambulatory Visit: Payer: Self-pay

## 2017-12-16 DIAGNOSIS — R252 Cramp and spasm: Secondary | ICD-10-CM | POA: Insufficient documentation

## 2017-12-16 DIAGNOSIS — M546 Pain in thoracic spine: Secondary | ICD-10-CM | POA: Insufficient documentation

## 2017-12-16 DIAGNOSIS — M25611 Stiffness of right shoulder, not elsewhere classified: Secondary | ICD-10-CM | POA: Diagnosis not present

## 2017-12-16 DIAGNOSIS — M25511 Pain in right shoulder: Secondary | ICD-10-CM

## 2017-12-16 DIAGNOSIS — M6281 Muscle weakness (generalized): Secondary | ICD-10-CM

## 2017-12-16 DIAGNOSIS — R293 Abnormal posture: Secondary | ICD-10-CM | POA: Diagnosis not present

## 2017-12-16 NOTE — Therapy (Signed)
[facility] [address]  [place], [place], [address] Phone: [phone]   Fax:  [phone]  Physical Therapy Evaluation  Patient Details  Name: Jorge [name] MRN: [id number] Date of Birth: 08-13-1955 Referring Provider: Elsie Saas, MD   Encounter Date: 12/16/2017  PT End of Session - 12/16/17 1058    Visit Number  1    Number of Visits  12    Date for PT Re-Evaluation  01/27/18    Authorization Type  [employer]    Authorization - Number of Visits  60    PT Start Time  6962    PT Stop Time  1149    PT Time Calculation (min)  51 min    Activity Tolerance  Patient tolerated treatment well    Behavior During Therapy  [facility] for tasks assessed/performed       Past Medical History:  Diagnosis Date  . Anxiety   . Bone metastasis ([place])   . Chest cold 05/19/2016   productive cough  -- started on antibiotic  . Chronic back pain    due to bone mets from myeloma  . Cough   . Depression   . GERD (gastroesophageal reflux disease)   . Hiatal hernia   . History of chicken pox   . History of concussion    age 62 -- no residual  . History of DVT of lower extremity 03/21/2016  treated and completed w/ xarelto   per doppler left extensive occlusion common femoral, femoral, and popliteal veins and right partial occlusion common femoral and profunda femoral veins/  last doppler 06-04-2016 no evidence acute or chronic dvt noted either leg   . History of radiation therapy 06/09/16-06/23/16   lower thoracic spine 25 Gy in 10 fractions  . Mouth ulcers    secondary to radiation  . Multiple myeloma ([place]) dx 02/22/2016 via bone marrow bx---  oncologist-  [name]   IgG Kappa-- Hyperdiploid/ +11 w/ bone mets--  current treatment chemotherapy (started 08/ 2017)and pallitive radiation to back started 06-09-2016  . Renal calculus, right   . Wears contact lenses     Past Surgical History:  Procedure Laterality Date  . COLONOSCOPY   [id number]  . CYSTOSCOPY W/ URETERAL STENT PLACEMENT Right 06/20/2016   Procedure: CYSTOSCOPY WITH STENT REPLACEMENT;  Surgeon: Kathie Rhodes, MD;  Location: [facility];  Service: Urology;  Laterality: Right;  . CYSTOSCOPY WITH RETROGRADE PYELOGRAM, URETEROSCOPY AND STENT PLACEMENT Right 05/30/2016   Procedure: CYSTOSCOPY WITH RETROGRADE PYELOGRAM, URETEROSCOPY AND STENT PLACEMENT,DILITATION URETERAL STRICTURE;  Surgeon: Kathie Rhodes, MD;  Location: [facility];  Service: Urology;  Laterality: Right;  . CYSTOSCOPY/RETROGRADE/URETEROSCOPY/STONE EXTRACTION WITH BASKET Right 06/20/2016   Procedure: CYSTOSCOPY/URETEROSCOPY/STONE EXTRACTION WITH BASKET;  Surgeon: Kathie Rhodes, MD;  Location: [facility];  Service: Urology;  Laterality: Right;  . HOLMIUM LASER APPLICATION Right 95/08/8411   Procedure: HOLMIUM LASER APPLICATION;  Surgeon: Kathie Rhodes, MD;  Location: [facility];  Service: Urology;  Laterality: Right;  . IR GENERIC HISTORICAL  02/11/2016   IR RADIOLOGIST EVAL & MGMT 02/11/2016 [facility]  . IR GENERIC HISTORICAL  02/15/2016   IR BONE TUMOR(S)RF ABLATION 02/15/2016 Luanne Bras, MD [facility]  . IR GENERIC HISTORICAL  02/15/2016   IR BONE TUMOR(S)RF ABLATION 02/15/2016 Luanne Bras, MD [facility]  . IR GENERIC HISTORICAL  02/15/2016   IR BONE TUMOR(S)RF ABLATION 02/15/2016 Luanne Bras, MD [facility]  . IR GENERIC HISTORICAL  02/15/2016  IR KYPHO THORACIC WITH BONE BIOPSY 02/15/2016 Luanne Bras, MD [facility]  . IR GENERIC HISTORICAL  02/15/2016   IR KYPHO THORACIC WITH BONE BIOPSY 02/15/2016 Luanne Bras, MD [facility]  . IR GENERIC HISTORICAL  02/15/2016   IR VERTEBROPLASTY CERV/THOR BX INC UNI/BIL INC/INJECT/IMAGING 02/15/2016 Luanne Bras, MD [facility]  . IR GENERIC HISTORICAL  03/13/2016   IR KYPHO EA ADDL LEVEL THORACIC OR LUMBAR 03/13/2016 Luanne Bras, MD [facility]  . IR GENERIC HISTORICAL  03/13/2016    IR KYPHO EA ADDL LEVEL THORACIC OR LUMBAR 03/13/2016 Luanne Bras, MD [facility]  . IR GENERIC HISTORICAL  03/13/2016   IR BONE TUMOR(S)RF ABLATION 03/13/2016 Luanne Bras, MD [facility]  . IR GENERIC HISTORICAL  03/13/2016   IR KYPHO LUMBAR INC FX REDUCE BONE BX UNI/BIL CANNULATION INC/IMAGING 03/13/2016 Luanne Bras, MD [facility]  . IR GENERIC HISTORICAL  03/13/2016   IR BONE TUMOR(S)RF ABLATION 03/13/2016 Luanne Bras, MD [facility]  . IR GENERIC HISTORICAL  03/13/2016   IR BONE TUMOR(S)RF ABLATION 03/13/2016 Luanne Bras, MD [facility]  . IR GENERIC HISTORICAL  03/31/2016   IR RADIOLOGIST EVAL & MGMT 03/31/2016 [facility]  . LAPAROSCOPIC INGUINAL HERNIA REPAIR Bilateral 12-16-2013  dr gross  . RADIOLOGY WITH ANESTHESIA N/A 02/15/2016   Procedure: Spinal Ablation;  Surgeon: Luanne Bras, MD;  Location: [facility];  Service: Radiology;  Laterality: N/A;  . RADIOLOGY WITH ANESTHESIA N/A 03/13/2016   Procedure: LUMBER ABLATION;  Surgeon: Luanne Bras, MD;  Location: [facility];  Service: Radiology;  Laterality: N/A;  . ROTATOR CUFF REPAIR Right 2003  . TONSILLECTOMY  age 62  . WISDOM TOOTH EXTRACTION      There were no vitals filed for this visit.   Subjective Assessment - 12/16/17 1104    Subjective  As of February was regualrly doing pushup and developed tendinitis in R shoulder. Took a break from pushups and received injection with some relief, but everytime he tries to become more active the shoulder flares up. Reports remote h/o of RTC repair and debridement. MD wanting him to try PT before proceeding with another injection. Also notes ~2 mo h/o severe cramps (presumed side effect of medication).    Pertinent History  multiple myeloma s/p stem cell transplant 08/22/2016 at [facility] - currently controlled but not in remission - follows up regularly with Dr. Marin Olp; multiple vertebral compression fractures with kyphoplasties and vertbroloplasties    Limitations   House hold activities;Lifting    Diagnostic tests  X-ray completed in MD office revealed some OA in R shoulder    Patient Stated Goals  "to have more movement in my arm, get rid of most of the pain, and start strengthening my shoulder"    Currently in Pain?  No/denies    Pain Score  0-No pain up to 6/10 briefly    Pain Location  Shoulder    Pain Orientation  Right;Anterior    Pain Descriptors / Indicators  Aching    Pain Type  Acute pain;Chronic pain    Pain Radiating Towards  occasionally down tricep    Pain Onset  More than a month ago Feb 2019    Pain Frequency  Intermittent    Aggravating Factors   move quickly or lift with R arm, sleeping on R side, working on computer/typewriter keyboard    Pain Relieving Factors  rest, ice    Effect of Pain on Daily Activities  limits exercises, difficulty lifting, difficulty reaching overehead or tucking shirt in  d/t limited ROM         [facility] PT Assessment - 12/16/17 1058      Assessment   Medical Diagnosis  R shoulder OA & RTC tendonitis    Referring Provider  Elsie Saas, MD    Onset Date/Surgical Date  -- Feb 2019    Hand Dominance  Right    Next MD Visit  PRN    Prior Therapy  PT for deconditioning d/t multiple myeloma in 2018      Precautions   Precautions  Back      Restrictions   Other Position/Activity Restrictions  lifting restricitons - minimize bending and avoid heavy lifting      Balance Screen   Has the patient fallen in the past 6 months  Yes    How many times?  1    Has the patient had a decrease in activity level because of a fear of falling?   No    Is the patient reluctant to leave their home because of a fear of falling?   No      Home Environment   Living Environment  Private residence    Living Arrangements  Spouse/significant other    Type of [place] to enter    Entrance Stairs-Number of Steps  1    [place]  Two level;Able to live on main level with bedroom/bathroom;Full  bath on main level      Prior Function   Level of Independence  Independent    Vocation  On disability    Leisure  writing blogs, working out - walk daily, bands/stretching 5x/wk      Posture/Postural Control   Posture/Postural Control  Postural limitations    Postural Limitations  Forward head;Rounded Shoulders;Increased thoracic kyphosis      ROM / Strength   AROM / PROM / Strength  AROM;Strength      AROM   AROM Assessment Site  Shoulder    Right/Left Shoulder  Right;Left    Right Shoulder Flexion  123 Degrees    Right Shoulder ABduction  140 Degrees    Right Shoulder Internal Rotation  60 Degrees    Right Shoulder External Rotation  55 Degrees    Left Shoulder Flexion  146 Degrees    Left Shoulder ABduction  141 Degrees    Left Shoulder Internal Rotation  87 Degrees    Left Shoulder External Rotation  79 Degrees      Strength   Strength Assessment Site  Shoulder    Right/Left Shoulder  Right;Left    Right Shoulder Flexion  4+/5    Right Shoulder ABduction  4/5 pain    Right Shoulder Internal Rotation  4-/5 pain    Right Shoulder External Rotation  4-/5 pain    Left Shoulder Flexion  5/5    Left Shoulder ABduction  5/5    Left Shoulder Internal Rotation  5/5    Left Shoulder External Rotation  4+/5      Palpation   Palpation comment  increased muscle tension & ttp in R posterior capsule, medial scapular muscle + increased muscle tension/tightness in pecs                Objective measurements completed on examination: See above findings.      [facility] Adult PT Treatment/Exercise - 12/16/17 1058      Self-Care   Self-Care  Posture    Posture  Provided education in neutral spine  and shoulder posture to reduce risk for shoulder/RTC impingement.      Exercises   Exercises  Shoulder      Shoulder Exercises: Seated   Retraction  Both;10 reps    Retraction Limitations  scap retraction - 5" hold    Other Seated Exercises  Chin tuck 10 x 5"    Other Seated  Exercises  Backward shoulder rolls x10      Shoulder Exercises: Standing   Horizontal ABduction  Both;10 reps;Theraband;Strengthening    Theraband Level (Shoulder Horizontal ABduction)  Level 3 (Green)    Horizontal ABduction Limitations  standing against pool noodle on wall for scapular activation input    External Rotation  Both;10 reps;Theraband;Strengthening    Theraband Level (Shoulder External Rotation)  Level 3 (Green)    External Rotation Limitations  standing against pool noodle on wall for scapular activation input      Shoulder Exercises: IT sales professional  30 seconds;1 rep each    Corner Stretch Limitations  3 way doorway stretch - increased discomfort reported with high position (pt instructed to defer from HEP if too painful/uncomfortable)             PT Education - 12/16/17 1149    Education Details  PT eval findings, anticipated POC & initial HEP    Person(s) Educated  Patient    Methods  Explanation;Demonstration;Handout    Comprehension  Verbalized understanding;Returned demonstration;Need further instruction       PT Short Term Goals - 12/16/17 1149      PT SHORT TERM GOAL #1   Title  independent with initial HEP    Status  New    Target Date  12/30/17        PT Long Term Goals - 12/16/17 1149      PT LONG TERM GOAL #1   Title  Independent with ongoing HEP +/- gym program     Status  New    Target Date  01/27/18      PT LONG TERM GOAL #2   Title  R shoulder AROM equivalent to L shoulder w/o pain provocation     Status  New    Target Date  01/27/18      PT LONG TERM GOAL #3   Title  R shoulder strength >/= 4+/5 w/o pain on resistance    Status  New    Target Date  01/27/18      PT LONG TERM GOAL #4   Title  Patient to report ability to perform daily activities including ADLs, reaching and light lifting with R arm w/o pain provocation    Status  New    Target Date  01/27/18      PT LONG TERM GOAL #5   Title  Patient to return to  working out w/o limitation due to R shoulder pain    Status  New    Target Date  01/27/18             Plan - 12/16/17 1149    Clinical Impression Statement  Voris is a 62 y/o male well known to this PT who presents to OP PT for R shoulder pain since Feb 2019 due to OA and RTC tendonitis. Pt previously seen for PT in 2018 for deconditioning and back pain following stem cell transplant on 08/22/16 for multiple myeloma and extensive multilevel kyphoplasties and vertebroplasties for spinal compression fractures associated with the multiple myeloma.  Today's assessment reveals forward flexed posture with forward  head, mild/mod kyphosis and forward rounded shoulders with protracted scapula R>L. R shoulder AROM mild to moderately limited relative to L shoulder with mild weakness and pain in R shoulder with MMT resistance. Increased muscle tension & ttp noted in R posterior shoulder musculature and R medial scapular muscle as well as increased muscle tension/tightness in R pecs. Posture and shoulder alignment as well as abnormal muscle tension likely contributing to R shoulder RTC impingement. Pt will benefit from skilled PT to address deficits listed to restore functional use of R UE and allow return to working [employer] training. Due to extensive vertebral involvement and compression fractures, will avoid all trunk flexion based activity and lifting >10 pounds.    History and Personal Factors relevant to plan of care:  multiple myeloma s/p stem cell transplant 08/22/2016 at [facility] - currently controlled but not in remission - follows up regularly with Dr. Marin Olp; multiple vertebral compression fractures with kyphoplasties and vertbroloplasties; recent pneumonia with sepsis requiring hospitalization; h/o R RTC repair; additonal complex PMH as above    Clinical Presentation  Evolving    Clinical Presentation due to:  evolving R shoulder pain since Feb 2019, not significanty improved with rest and injection +  complex medical history    Clinical Decision Making  Moderate    Rehab Potential  Good    Clinical Impairments Affecting Rehab Potential  multiple myeloma s/p stem cell transplant 08/22/2016 at [facility] - currently controlled but not in remission; multiple vertebral compression fractures with kyphoplasties and vertbroloplasties    PT Treatment/Interventions  Patient/family education;ADLs/Self Care Home Management;Therapeutic exercise;Therapeutic activities;Neuromuscular re-education;Manual techniques;Passive range of motion;Dry needling;Taping;Electrical Stimulation;Moist Heat;Cryotherapy;Vasopneumatic Device;Iontophoresis 76m/ml Dexamethasone    Consulted and Agree with Plan of Care  Patient       Patient will benefit from skilled therapeutic intervention in order to improve the following deficits and impairments:  Pain, Postural dysfunction, Decreased range of motion, Decreased strength, Increased muscle spasms, Impaired flexibility, Impaired UE functional use, Decreased activity tolerance  Visit Diagnosis: Acute pain of right shoulder  Stiffness of right shoulder, not elsewhere classified  Abnormal posture  Muscle weakness (generalized)  Cramp and spasm     Problem List Patient Active Problem List   Diagnosis Date Noted  . Bacteremia 11/10/2017  . Sepsis ([place] 11/09/2017  . HCAP (healthcare-associated pneumonia) 11/09/2017  . Thrombocytopenia ([place] 11/09/2017  . Iron deficiency anemia due to chronic blood loss   . Neutropenia ([place] 03/02/2017  . Cellulitis 03/01/2017  . Phlebitis or thrombophlebitis of lower extremity 07/21/2016  . Visit for preventive health examination 05/16/2016  . Aphthous ulcer 05/16/2016  . Ureterolithiasis 05/16/2016  . Acute deep vein thrombosis (DVT) of femoral vein of left lower extremity ([place] 03/21/2016  . Multiple myeloma ([place] 02/22/2016  . Multiple myeloma not having achieved remission ([place] 02/22/2016  . Bilateral inguinal hernia (BIH), s/p lap  repair 12/16/2013 11/02/2013    JPercival Spanish PT, MPT 12/16/2017, 2:22 PM  [facility] [place] [address]Phone: [phone]  Fax:  [phone] Name: MAlfonso CardenMRN: [id number] of Birth: 8Apr 14, 1957

## 2017-12-17 ENCOUNTER — Other Ambulatory Visit: Payer: Self-pay | Admitting: Hematology & Oncology

## 2017-12-17 DIAGNOSIS — D472 Monoclonal gammopathy: Secondary | ICD-10-CM

## 2017-12-17 DIAGNOSIS — I8290 Acute embolism and thrombosis of unspecified vein: Secondary | ICD-10-CM

## 2017-12-17 DIAGNOSIS — G8929 Other chronic pain: Secondary | ICD-10-CM

## 2017-12-17 DIAGNOSIS — M545 Low back pain: Secondary | ICD-10-CM

## 2017-12-17 DIAGNOSIS — C9000 Multiple myeloma not having achieved remission: Secondary | ICD-10-CM

## 2017-12-17 DIAGNOSIS — M7989 Other specified soft tissue disorders: Secondary | ICD-10-CM

## 2017-12-22 ENCOUNTER — Encounter: Payer: Self-pay | Admitting: Physical Therapy

## 2017-12-22 ENCOUNTER — Ambulatory Visit: Payer: 59 | Admitting: Physical Therapy

## 2017-12-22 DIAGNOSIS — M25511 Pain in right shoulder: Secondary | ICD-10-CM | POA: Diagnosis not present

## 2017-12-22 DIAGNOSIS — M6281 Muscle weakness (generalized): Secondary | ICD-10-CM

## 2017-12-22 DIAGNOSIS — R293 Abnormal posture: Secondary | ICD-10-CM

## 2017-12-22 DIAGNOSIS — M25611 Stiffness of right shoulder, not elsewhere classified: Secondary | ICD-10-CM

## 2017-12-22 NOTE — Therapy (Signed)
[facility] [address]  [place], [place], [address] Phone: [phone]   Fax:  [phone]  Physical Therapy Treatment  Patient Details  Name: Jorge [name] MRN: [id number] Date of Birth: 12-25-1955 Referring Provider: Elsie Saas, MD   Encounter Date: 12/22/2017  PT End of Session - 12/22/17 1447    Visit Number  2    Number of Visits  12    Date for PT Re-Evaluation  01/27/18    Authorization Type  UHC    Authorization - Number of Visits  60    PT Start Time  2505    PT Stop Time  1528    PT Time Calculation (min)  41 min    Activity Tolerance  Patient tolerated treatment well    Behavior During Therapy  [facility] for tasks assessed/performed       Past Medical History:  Diagnosis Date  . Anxiety   . Bone metastasis ([place])   . Chest cold 05/19/2016   productive cough  -- started on antibiotic  . Chronic back pain    due to bone mets from myeloma  . Cough   . Depression   . GERD (gastroesophageal reflux disease)   . Hiatal hernia   . History of chicken pox   . History of concussion    age 16 -- no residual  . History of DVT of lower extremity 03/21/2016  treated and completed w/ xarelto   per doppler left extensive occlusion common femoral, femoral, and popliteal veins and right partial occlusion common femoral and profunda femoral veins/  last doppler 06-04-2016 no evidence acute or chronic dvt noted either leg   . History of radiation therapy 06/09/16-06/23/16   lower thoracic spine 25 Gy in 10 fractions  . Mouth ulcers    secondary to radiation  . Multiple myeloma ([place]) dx 02/22/2016 via bone marrow bx---  oncologist-  [name]   IgG Kappa-- Hyperdiploid/ +11 w/ bone mets--  current treatment chemotherapy (started 08/ 2017)and pallitive radiation to back started 06-09-2016  . Renal calculus, right   . Wears contact lenses     Past Surgical History:  Procedure Laterality Date  . COLONOSCOPY   [id number]  . CYSTOSCOPY W/ URETERAL STENT PLACEMENT Right 06/20/2016   Procedure: CYSTOSCOPY WITH STENT REPLACEMENT;  Surgeon: Kathie Rhodes, MD;  Location: [facility];  Service: Urology;  Laterality: Right;  . CYSTOSCOPY WITH RETROGRADE PYELOGRAM, URETEROSCOPY AND STENT PLACEMENT Right 05/30/2016   Procedure: CYSTOSCOPY WITH RETROGRADE PYELOGRAM, URETEROSCOPY AND STENT PLACEMENT,DILITATION URETERAL STRICTURE;  Surgeon: Kathie Rhodes, MD;  Location: [facility];  Service: Urology;  Laterality: Right;  . CYSTOSCOPY/RETROGRADE/URETEROSCOPY/STONE EXTRACTION WITH BASKET Right 06/20/2016   Procedure: CYSTOSCOPY/URETEROSCOPY/STONE EXTRACTION WITH BASKET;  Surgeon: Kathie Rhodes, MD;  Location: [facility];  Service: Urology;  Laterality: Right;  . HOLMIUM LASER APPLICATION Right 39/01/6733   Procedure: HOLMIUM LASER APPLICATION;  Surgeon: Kathie Rhodes, MD;  Location: [facility];  Service: Urology;  Laterality: Right;  . IR GENERIC HISTORICAL  02/11/2016   IR RADIOLOGIST EVAL & MGMT 02/11/2016 [facility]  . IR GENERIC HISTORICAL  02/15/2016   IR BONE TUMOR(S)RF ABLATION 02/15/2016 Luanne Bras, MD [facility]  . IR GENERIC HISTORICAL  02/15/2016   IR BONE TUMOR(S)RF ABLATION 02/15/2016 Luanne Bras, MD [facility]  . IR GENERIC HISTORICAL  02/15/2016   IR BONE TUMOR(S)RF ABLATION 02/15/2016 Luanne Bras, MD [facility]  . IR GENERIC HISTORICAL  02/15/2016  IR KYPHO THORACIC WITH BONE BIOPSY 02/15/2016 Luanne Bras, MD [facility]  . IR GENERIC HISTORICAL  02/15/2016   IR KYPHO THORACIC WITH BONE BIOPSY 02/15/2016 Luanne Bras, MD [facility]  . IR GENERIC HISTORICAL  02/15/2016   IR VERTEBROPLASTY CERV/THOR BX INC UNI/BIL INC/INJECT/IMAGING 02/15/2016 Luanne Bras, MD [facility]  . IR GENERIC HISTORICAL  03/13/2016   IR KYPHO EA ADDL LEVEL THORACIC OR LUMBAR 03/13/2016 Luanne Bras, MD [facility]  . IR GENERIC HISTORICAL  03/13/2016    IR KYPHO EA ADDL LEVEL THORACIC OR LUMBAR 03/13/2016 Luanne Bras, MD [facility]  . IR GENERIC HISTORICAL  03/13/2016   IR BONE TUMOR(S)RF ABLATION 03/13/2016 Luanne Bras, MD [facility]  . IR GENERIC HISTORICAL  03/13/2016   IR KYPHO LUMBAR INC FX REDUCE BONE BX UNI/BIL CANNULATION INC/IMAGING 03/13/2016 Luanne Bras, MD [facility]  . IR GENERIC HISTORICAL  03/13/2016   IR BONE TUMOR(S)RF ABLATION 03/13/2016 Luanne Bras, MD [facility]  . IR GENERIC HISTORICAL  03/13/2016   IR BONE TUMOR(S)RF ABLATION 03/13/2016 Luanne Bras, MD [facility]  . IR GENERIC HISTORICAL  03/31/2016   IR RADIOLOGIST EVAL & MGMT 03/31/2016 [facility]  . LAPAROSCOPIC INGUINAL HERNIA REPAIR Bilateral 12-16-2013  dr gross  . RADIOLOGY WITH ANESTHESIA N/A 02/15/2016   Procedure: Spinal Ablation;  Surgeon: Luanne Bras, MD;  Location: [facility];  Service: Radiology;  Laterality: N/A;  . RADIOLOGY WITH ANESTHESIA N/A 03/13/2016   Procedure: LUMBER ABLATION;  Surgeon: Luanne Bras, MD;  Location: [facility];  Service: Radiology;  Laterality: N/A;  . ROTATOR CUFF REPAIR Right 2003  . TONSILLECTOMY  age 4  . WISDOM TOOTH EXTRACTION      There were no vitals filed for this visit.  Subjective Assessment - 12/22/17 1448    Subjective  Pt reports pain remains intermittent, typically brief associated with certain movements. Did note more soreness yesterday which he attributes to sleeping position the night before.    Pertinent History  multiple myeloma s/p stem cell transplant 08/22/2016 at [facility] - currently controlled but not in remission - follows up regularly with Dr. Marin Olp; multiple vertebral compression fractures with kyphoplasties and vertbroloplasties    Limitations  House hold activities;Lifting    Diagnostic tests  X-ray completed in MD office revealed some OA in R shoulder    Patient Stated Goals  "to have more movement in my arm, get rid of most of the pain, and start strengthening my  shoulder"    Currently in Pain?  No/denies    Pain Score  0-No pain up to 2/59 with certain movements    Pain Location  Shoulder    Pain Orientation  Right;Anterior    Pain Descriptors / Indicators  Sharp;Aching    Pain Type  Acute pain;Chronic pain    Pain Onset  -- Feb 2019                       [facility] Adult PT Treatment/Exercise - 12/22/17 1447      Exercises   Exercises  Shoulder      Shoulder Exercises: Seated   External Rotation  Both;15 reps;Theraband;Strengthening    Theraband Level (Shoulder External Rotation)  Level 3 (Green)    External Rotation Limitations  cues for scap retraction      Shoulder Exercises: Standing   Diagonals  Both;15 reps;Theraband;Strengthening    Theraband Level (Shoulder Diagonals)  Level 3 (Green)    Diagonals Limitations  cues for scap retraction  Shoulder Exercises: ROM/Strengthening   UBE (Upper Arm Bike)  L2 x 6' (3' fwd/3' beck)    Ball on Wall  R shoulder rhythmic stabilization at 90 dg flexion to fatigue x 3 sets    Other ROM/Strengthening Exercises  R shoulder rebounder toss - green med ball x11, red med ball x15      Manual Therapy   Manual Therapy  Soft tissue mobilization;Myofascial release;Passive ROM;Joint mobilization    Manual therapy comments  supine    Joint Mobilization  R shoulder grade II-III distraction/inferior glide    Soft tissue mobilization  R pecs & posterior shoulder complex    Myofascial Release  manual TPR to R teres group, lateral subscapularis    Passive ROM  MWM into R shoulder flexion               PT Short Term Goals - 12/22/17 1451      PT SHORT TERM GOAL #1   Title  independent with initial HEP    Status  On-going        PT Long Term Goals - 12/22/17 1451      PT LONG TERM GOAL #1   Title  Independent with ongoing HEP +/- gym program     Status  On-going      PT LONG TERM GOAL #2   Title  R shoulder AROM equivalent to L shoulder w/o pain provocation     Status   On-going      PT LONG TERM GOAL #3   Title  R shoulder strength >/= 4+/5 w/o pain on resistance    Status  On-going      PT LONG TERM GOAL #4   Title  Patient to report ability to perform daily activities including ADLs, reaching and light lifting with R arm w/o pain provocation    Status  On-going      PT LONG TERM GOAL #5   Title  Patient to return to working out w/o limitation due to R shoulder pain    Status  On-going            Plan - 12/22/17 [place]    Clinical Impression Statement  Jorge [name] tolerated treatment well today. Pt expressed TTP on the short head of the biceps tendon, subscapularis and teres minor which were then treated with STM.  Pt continues to demonstrate decreased strength of R shoulder musculature, and impaired ability to raise the R UE above his head secondary to impingement. Pt will continue to benefit from physical therapy to address these impairments and progress towards functional goals.     Rehab Potential  Good    Clinical Impairments Affecting Rehab Potential  multiple myeloma s/p stem cell transplant 08/22/2016 at [facility] - currently controlled but not in remission; multiple vertebral compression fractures with kyphoplasties and vertbroloplasties    PT Treatment/Interventions  Patient/family education;ADLs/Self Care Home Management;Therapeutic exercise;Therapeutic activities;Neuromuscular re-education;Manual techniques;Passive range of motion;Dry needling;Taping;Electrical Stimulation;Moist Heat;Cryotherapy;Vasopneumatic Device;Iontophoresis 68m/ml Dexamethasone    Consulted and Agree with Plan of Care  Patient       Patient will benefit from skilled therapeutic intervention in order to improve the following deficits and impairments:  Pain, Postural dysfunction, Decreased range of motion, Decreased strength, Increased muscle spasms, Impaired flexibility, Impaired UE functional use, Decreased activity tolerance, Abnormal gait  Visit Diagnosis: Acute pain of  right shoulder  Stiffness of right shoulder, not elsewhere classified  Abnormal posture  Muscle weakness (generalized)     Problem List Patient Active Problem List  Diagnosis Date Noted  . Bacteremia 11/10/2017  . Sepsis ([place]) 11/09/2017  . HCAP (healthcare-associated pneumonia) 11/09/2017  . Thrombocytopenia ([place]) 11/09/2017  . Iron deficiency anemia due to chronic blood loss   . Neutropenia ([place]) 03/02/2017  . Cellulitis 03/01/2017  . Phlebitis or thrombophlebitis of lower extremity 07/21/2016  . Visit for preventive health examination 05/16/2016  . Aphthous ulcer 05/16/2016  . Ureterolithiasis 05/16/2016  . Acute deep vein thrombosis (DVT) of femoral vein of left lower extremity ([place]) 03/21/2016  . Multiple myeloma ([place]) 02/22/2016  . Multiple myeloma not having achieved remission ([place]) 02/22/2016  . Bilateral inguinal hernia (BIH), s/p lap repair 12/16/2013 11/02/2013    Percival Spanish, PT, MPT 12/22/2017, 6:38 PM  [facility] [address]  [place], [place], [address] Phone: [phone]   Fax:  [phone]  Name: Jorge [name] MRN: [id number] Date of Birth: 1956-05-02

## 2017-12-23 ENCOUNTER — Other Ambulatory Visit: Payer: 59

## 2017-12-23 ENCOUNTER — Ambulatory Visit: Payer: 59 | Admitting: Hematology & Oncology

## 2017-12-23 ENCOUNTER — Ambulatory Visit: Payer: 59

## 2017-12-24 ENCOUNTER — Encounter: Payer: Self-pay | Admitting: Physical Therapy

## 2017-12-24 ENCOUNTER — Ambulatory Visit: Payer: 59 | Admitting: Physical Therapy

## 2017-12-24 DIAGNOSIS — M25511 Pain in right shoulder: Secondary | ICD-10-CM

## 2017-12-24 DIAGNOSIS — M25611 Stiffness of right shoulder, not elsewhere classified: Secondary | ICD-10-CM

## 2017-12-24 DIAGNOSIS — R293 Abnormal posture: Secondary | ICD-10-CM

## 2017-12-24 NOTE — Therapy (Signed)
[facility] [address]  [place], [place], [address] Phone: [phone]   Fax:  [phone]  Physical Therapy Treatment  Patient Details  Name: Jorge [name] MRN: [id number] Date of Birth: 22-Apr-1956 Referring Provider: Elsie Saas, MD   Encounter Date: 12/24/2017  PT End of Session - 12/24/17 1100    Visit Number  3    Number of Visits  12    Date for PT Re-Evaluation  01/27/18    Authorization Type  UHC    Authorization - Number of Visits  60    PT Start Time  1100    PT Stop Time  1147    PT Time Calculation (min)  47 min    Activity Tolerance  Patient tolerated treatment well    Behavior During Therapy  [facility] for tasks assessed/performed       Past Medical History:  Diagnosis Date  . Anxiety   . Bone metastasis ([place])   . Chest cold 05/19/2016   productive cough  -- started on antibiotic  . Chronic back pain    due to bone mets from myeloma  . Cough   . Depression   . GERD (gastroesophageal reflux disease)   . Hiatal hernia   . History of chicken pox   . History of concussion    age 1 -- no residual  . History of DVT of lower extremity 03/21/2016  treated and completed w/ xarelto   per doppler left extensive occlusion common femoral, femoral, and popliteal veins and right partial occlusion common femoral and profunda femoral veins/  last doppler 06-04-2016 no evidence acute or chronic dvt noted either leg   . History of radiation therapy 06/09/16-06/23/16   lower thoracic spine 25 Gy in 10 fractions  . Mouth ulcers    secondary to radiation  . Multiple myeloma ([place]) dx 02/22/2016 via bone marrow bx---  oncologist-  [name]   IgG Kappa-- Hyperdiploid/ +11 w/ bone mets--  current treatment chemotherapy (started 08/ 2017)and pallitive radiation to back started 06-09-2016  . Renal calculus, right   . Wears contact lenses     Past Surgical History:  Procedure Laterality Date  . COLONOSCOPY   [id number]  . CYSTOSCOPY W/ URETERAL STENT PLACEMENT Right 06/20/2016   Procedure: CYSTOSCOPY WITH STENT REPLACEMENT;  Surgeon: Kathie Rhodes, MD;  Location: [facility];  Service: Urology;  Laterality: Right;  . CYSTOSCOPY WITH RETROGRADE PYELOGRAM, URETEROSCOPY AND STENT PLACEMENT Right 05/30/2016   Procedure: CYSTOSCOPY WITH RETROGRADE PYELOGRAM, URETEROSCOPY AND STENT PLACEMENT,DILITATION URETERAL STRICTURE;  Surgeon: Kathie Rhodes, MD;  [facility];  Service: Urology;  Laterality: Right;  . CYSTOSCOPY/RETROGRADE/URETEROSCOPY/STONE EXTRACTION WITH BASKET Right 06/20/2016   Procedure: CYSTOSCOPY/URETEROSCOPY/STONE EXTRACTION WITH BASKET;  Surgeon: Kathie Rhodes, MD;  Location: [facility];  Service: Urology;  Laterality: Right;  . HOLMIUM LASER APPLICATION Right 14/10/3152   Procedure: HOLMIUM LASER APPLICATION;  Surgeon: Kathie Rhodes, MD;  Location: [facility];  Service: Urology;  Laterality: Right;  . IR GENERIC HISTORICAL  02/11/2016   IR RADIOLOGIST EVAL & MGMT 02/11/2016 [facility]  . IR GENERIC HISTORICAL  02/15/2016   IR BONE TUMOR(S)RF ABLATION 02/15/2016 Luanne Bras, MD [facility]  . IR GENERIC HISTORICAL  02/15/2016   IR BONE TUMOR(S)RF ABLATION 02/15/2016 Luanne Bras, MD [facility]  . IR GENERIC HISTORICAL  02/15/2016   IR BONE TUMOR(S)RF ABLATION 02/15/2016 Luanne Bras, MD [facility]  . IR GENERIC HISTORICAL  02/15/2016  IR KYPHO THORACIC WITH BONE BIOPSY 02/15/2016 Luanne Bras, MD [facility]  . IR GENERIC HISTORICAL  02/15/2016   IR KYPHO THORACIC WITH BONE BIOPSY 02/15/2016 Luanne Bras, MD [facility]  . IR GENERIC HISTORICAL  02/15/2016   IR VERTEBROPLASTY CERV/THOR BX INC UNI/BIL INC/INJECT/IMAGING 02/15/2016 Luanne Bras, MD [facility]  . IR GENERIC HISTORICAL  03/13/2016   IR KYPHO EA ADDL LEVEL THORACIC OR LUMBAR 03/13/2016 Luanne Bras, MD [facility]  . IR GENERIC HISTORICAL  03/13/2016    IR KYPHO EA ADDL LEVEL THORACIC OR LUMBAR 03/13/2016 Luanne Bras, MD [facility]  . IR GENERIC HISTORICAL  03/13/2016   IR BONE TUMOR(S)RF ABLATION 03/13/2016 Luanne Bras, MD [facility]  . IR GENERIC HISTORICAL  03/13/2016   IR KYPHO LUMBAR INC FX REDUCE BONE BX UNI/BIL CANNULATION INC/IMAGING 03/13/2016 Luanne Bras, MD [facility]  . IR GENERIC HISTORICAL  03/13/2016   IR BONE TUMOR(S)RF ABLATION 03/13/2016 Luanne Bras, MD [facility]  . IR GENERIC HISTORICAL  03/13/2016   IR BONE TUMOR(S)RF ABLATION 03/13/2016 Luanne Bras, MD [facility]  . IR GENERIC HISTORICAL  03/31/2016   IR RADIOLOGIST EVAL & MGMT 03/31/2016 [facility]  . LAPAROSCOPIC INGUINAL HERNIA REPAIR Bilateral 12-16-2013  dr gross  . RADIOLOGY WITH ANESTHESIA N/A 02/15/2016   Procedure: Spinal Ablation;  Surgeon: Luanne Bras, MD;  Location: [facility];  Service: Radiology;  Laterality: N/A;  . RADIOLOGY WITH ANESTHESIA N/A 03/13/2016   Procedure: LUMBER ABLATION;  Surgeon: Luanne Bras, MD;  Location: [facility];  Service: Radiology;  Laterality: N/A;  . ROTATOR CUFF REPAIR Right 2003  . TONSILLECTOMY  age 18  . WISDOM TOOTH EXTRACTION      There were no vitals filed for this visit.  Subjective Assessment - 12/24/17 1101    Subjective  Pt reporting no sharp, catching pains in the R shoulder in the past few days, but still having pain if he rolls over on the shoulder when sleeping.    Pertinent History  multiple myeloma s/p stem cell transplant 08/22/2016 at [facility] - currently controlled but not in remission - follows up regularly with Dr. Marin Olp; multiple vertebral compression fractures with kyphoplasties and vertbroloplasties    Limitations  House hold activities;Lifting    Diagnostic tests  X-ray completed in MD office revealed some OA in R shoulder    Patient Stated Goals  "to have more movement in my arm, get rid of most of the pain, and start strengthening my shoulder"    Currently in Pain?   Yes    Pain Score  1     Pain Location  Shoulder    Pain Orientation  Right;Anterior    Pain Descriptors / Indicators  Sharp;Aching    Pain Type  Acute pain;Chronic pain    Pain Onset  -- Feb 2019    Pain Frequency  Intermittent                       [facility] Adult PT Treatment/Exercise - 12/24/17 1100      Exercises   Exercises  Shoulder      Shoulder Exercises: Standing   Extension  Both;15 reps;Theraband;Strengthening    Theraband Level (Shoulder Extension)  -- Black TB    Extension Limitations  cues for scap retraction & depression    Row  Both;15 reps;Theraband;Strengthening    Theraband Level (Shoulder Row)  -- Black TB    Row Limitations  cues for scap retraction & depression  Shoulder Exercises: ROM/Strengthening   UBE (Upper Arm Bike)  L3.0 x 6' (3' fwd/3' beck)    Lat Pull  15 reps    Lat Pull Limitations  25# cues for scap retraction & depression    Rhythmic Stabilization, Supine  R shoulder at 90 dg flexion 5# - 3 x 30"      Shoulder Exercises: Stretch   Corner Stretch  30 seconds;1 rep each    Corner Stretch Limitations  3 way doorway stretch - better tolerance for high position today    Cross Chest Stretch  30 seconds;2 reps each    Cross Chest Stretch Limitations  R posterior caspule with opp hand & holding onto door frame    Other Shoulder Stretches  R open book stretch 5 x 10"      Modalities   Modalities  Iontophoresis      Iontophoresis   Type of Iontophoresis  Dexamethasone    Location  R anterior shoulder (over coracoid process)    Dose  80 mA-min, 1.0 mL    Time  4-6 hr patch (#1 of 6)      Manual Therapy   Manual Therapy  Soft tissue mobilization;Myofascial release;Joint mobilization;Scapular mobilization;Muscle Energy Technique    Manual therapy comments  hooklying    Joint Mobilization  R shoulder grade II-III distraction/inferior glide    Soft tissue mobilization  STM & DTM to R pecs, anterior deltoid, proximal biceps &  posterior shoulder complex    Myofascial Release  manual TPR to R teres group, lateral subscapularis    Scapular Mobilization  R scapula - emphasis on retraction & dperssion    Muscle Energy Technique  gentle contract/relax stretch in R shoulder ER/IR             PT Education - 12/24/17 1145    Education Details  Iontophoresis patch wearing instructions + precautions/contraindications    Person(s) Educated  Patient    Methods  Explanation;Handout    Comprehension  Verbalized understanding       PT Short Term Goals - 12/24/17 1147      PT SHORT TERM GOAL #1   Title  independent with initial HEP    Status  Achieved        PT Long Term Goals - 12/22/17 1451      PT LONG TERM GOAL #1   Title  Independent with ongoing HEP +/- gym program     Status  On-going      PT LONG TERM GOAL #2   Title  R shoulder AROM equivalent to L shoulder w/o pain provocation     Status  On-going      PT LONG TERM GOAL #3   Title  R shoulder strength >/= 4+/5 w/o pain on resistance    Status  On-going      PT LONG TERM GOAL #4   Title  Patient to report ability to perform daily activities including ADLs, reaching and light lifting with R arm w/o pain provocation    Status  On-going      PT LONG TERM GOAL #5   Title  Patient to return to working out w/o limitation due to R shoulder pain    Status  On-going            Plan - 12/24/17 1104    Clinical Impression Statement  Jorge [name] noting improvement with decreasing incidence of sudden "sharp, catching" pain with use of R shoulder/arm, but still noting discomfort with laying on  R side. Reports he has been working on self-STM for pecs and posterior shoulder complex with some reduction in tension noted but ongoing ttp/tightness in lateral pecs and teres group/lats - some improvement after manual therapy today but may benefit from DN in future visits. R scapula remains elevated relative to L, therefore therapeutic exercises targeting scapular  retraction and depression to promote neutral shoulder alignment to reduce risk for impingement. Treatment concluded with initial trail of ionotphoresis patch to anterior shoulder to promote further reduction in pain and inflammation.    Rehab Potential  Good    Clinical Impairments Affecting Rehab Potential  multiple myeloma s/p stem cell transplant 08/22/2016 at [facility] - currently controlled but not in remission; multiple vertebral compression fractures with kyphoplasties and vertbroloplasties    PT Treatment/Interventions  Patient/family education;ADLs/Self Care Home Management;Therapeutic exercise;Therapeutic activities;Neuromuscular re-education;Manual techniques;Passive range of motion;Dry needling;Taping;Electrical Stimulation;Moist Heat;Cryotherapy;Vasopneumatic Device;Iontophoresis 42m/ml Dexamethasone    Consulted and Agree with Plan of Care  Patient       Patient will benefit from skilled therapeutic intervention in order to improve the following deficits and impairments:  Pain, Postural dysfunction, Decreased range of motion, Decreased strength, Increased muscle spasms, Impaired flexibility, Impaired UE functional use, Decreased activity tolerance, Abnormal gait  Visit Diagnosis: Acute pain of right shoulder  Stiffness of right shoulder, not elsewhere classified  Abnormal posture     Problem List Patient Active Problem List   Diagnosis Date Noted  . Bacteremia 11/10/2017  . Sepsis ([place] 11/09/2017  . HCAP (healthcare-associated pneumonia) 11/09/2017  . Thrombocytopenia ([place] 11/09/2017  . Iron deficiency anemia due to chronic blood loss   . Neutropenia ([place] 03/02/2017  . Cellulitis 03/01/2017  . Phlebitis or thrombophlebitis of lower extremity 07/21/2016  . Visit for preventive health examination 05/16/2016  . Aphthous ulcer 05/16/2016  . Ureterolithiasis 05/16/2016  . Acute deep vein thrombosis (DVT) of femoral vein of left lower extremity ([place] 03/21/2016  . Multiple  myeloma ([place] 02/22/2016  . Multiple myeloma not having achieved remission ([place] 02/22/2016  . Bilateral inguinal hernia (BIH), s/p lap repair 12/16/2013 11/02/2013    JPercival Spanish PT, MPT 12/24/2017, 12:01 PM  [facility] [place] [place] [address]Phone: [phone]  Fax:  [phone] Name: MRoshon DuellMRN: [id number] of Birth: [birth year]

## 2017-12-24 NOTE — Patient Instructions (Signed)

## 2017-12-28 ENCOUNTER — Ambulatory Visit: Payer: 59

## 2017-12-28 ENCOUNTER — Ambulatory Visit: Payer: 59 | Admitting: Hematology & Oncology

## 2017-12-28 ENCOUNTER — Other Ambulatory Visit: Payer: 59

## 2017-12-30 ENCOUNTER — Ambulatory Visit: Payer: 59 | Admitting: Hematology & Oncology

## 2017-12-30 ENCOUNTER — Ambulatory Visit: Payer: 59

## 2017-12-30 ENCOUNTER — Other Ambulatory Visit: Payer: 59

## 2018-01-04 ENCOUNTER — Other Ambulatory Visit: Payer: Self-pay | Admitting: Medical

## 2018-01-04 DIAGNOSIS — G4701 Insomnia due to medical condition: Secondary | ICD-10-CM

## 2018-01-04 DIAGNOSIS — G8929 Other chronic pain: Principal | ICD-10-CM

## 2018-01-04 NOTE — Telephone Encounter (Signed)
Copied from [place] [id number]. Topic: Quick Communication - Rx Refill/Question >> Jan 04, 2018 12:18 PM Waldemar Dickens, Sade R wrote: Medication:zolpidem (AMBIEN) 10 MG tablet  Has the patient contacted their pharmacy? Yes (Agent: If no, request that the patient contact the pharmacy for the refill.) (Agent: If yes, when and what did the pharmacy advise?)  Preferred Pharmacy (with phone number or street name):  [id number] [place], [place] [address] [phone] (Phone) [phone] (Fax)     Agent: Please be advised that RX refills may take up to 3 business days. We ask that you follow-up with your pharmacy.

## 2018-01-04 NOTE — Telephone Encounter (Signed)
Ambien refill Last Refilled by another provider on 06/30/18 #30 with 3 refills Last OV: 07/16/17 PCP: Zane Herald [facility] [place]

## 2018-01-05 ENCOUNTER — Other Ambulatory Visit: Payer: Self-pay

## 2018-01-05 ENCOUNTER — Inpatient Hospital Stay (HOSPITAL_BASED_OUTPATIENT_CLINIC_OR_DEPARTMENT_OTHER): Payer: 59 | Admitting: Hematology & Oncology

## 2018-01-05 ENCOUNTER — Inpatient Hospital Stay: Payer: 59

## 2018-01-05 ENCOUNTER — Inpatient Hospital Stay: Payer: 59 | Attending: Hematology & Oncology

## 2018-01-05 VITALS — BP 116/66 | HR 77 | Temp 97.2°F | Resp 18 | Wt 176.8 lb

## 2018-01-05 DIAGNOSIS — Z79899 Other long term (current) drug therapy: Secondary | ICD-10-CM | POA: Insufficient documentation

## 2018-01-05 DIAGNOSIS — C9000 Multiple myeloma not having achieved remission: Secondary | ICD-10-CM

## 2018-01-05 DIAGNOSIS — Z9484 Stem cells transplant status: Secondary | ICD-10-CM | POA: Diagnosis not present

## 2018-01-05 DIAGNOSIS — Z7901 Long term (current) use of anticoagulants: Secondary | ICD-10-CM

## 2018-01-05 DIAGNOSIS — R252 Cramp and spasm: Secondary | ICD-10-CM

## 2018-01-05 DIAGNOSIS — Z86718 Personal history of other venous thrombosis and embolism: Secondary | ICD-10-CM

## 2018-01-05 LAB — CMP (CANCER CENTER ONLY)
ALT: 42 U/L (ref 10–47)
ANION GAP: 11 (ref 5–15)
AST: 37 U/L (ref 11–38)
Albumin: 3.3 g/dL — ABNORMAL LOW (ref 3.5–5.0)
Alkaline Phosphatase: 57 U/L (ref 26–84)
BUN: 11 mg/dL (ref 7–22)
CHLORIDE: 106 mmol/L (ref 98–108)
CO2: 25 mmol/L (ref 18–33)
Calcium: 8.8 mg/dL (ref 8.0–10.3)
Creatinine: 1 mg/dL (ref 0.60–1.20)
Glucose, Bld: 168 mg/dL — ABNORMAL HIGH (ref 73–118)
POTASSIUM: 4.1 mmol/L (ref 3.3–4.7)
SODIUM: 142 mmol/L (ref 128–145)
TOTAL PROTEIN: 6.2 g/dL — AB (ref 6.4–8.1)
Total Bilirubin: 0.6 mg/dL (ref 0.2–1.6)

## 2018-01-05 LAB — CBC WITH DIFFERENTIAL (CANCER CENTER ONLY)
Basophils Absolute: 0 10*3/uL (ref 0.0–0.1)
Basophils Relative: 1 %
EOS ABS: 0.1 10*3/uL (ref 0.0–0.5)
EOS PCT: 4 %
HCT: 31.3 % — ABNORMAL LOW (ref 38.7–49.9)
Hemoglobin: 9.7 g/dL — ABNORMAL LOW (ref 13.0–17.1)
LYMPHS ABS: 0.8 10*3/uL — AB (ref 0.9–3.3)
LYMPHS PCT: 30 %
MCH: 28.7 pg (ref 28.0–33.4)
MCHC: 31 g/dL — AB (ref 32.0–35.9)
MCV: 92.6 fL (ref 82.0–98.0)
MONO ABS: 0.3 10*3/uL (ref 0.1–0.9)
MONOS PCT: 11 %
Neutro Abs: 1.5 10*3/uL (ref 1.5–6.5)
Neutrophils Relative %: 54 %
PLATELETS: 146 10*3/uL (ref 145–400)
RBC: 3.38 MIL/uL — ABNORMAL LOW (ref 4.20–5.70)
RDW: 17.6 % — ABNORMAL HIGH (ref 11.1–15.7)
WBC Count: 2.7 10*3/uL — ABNORMAL LOW (ref 4.0–10.0)

## 2018-01-05 LAB — MAGNESIUM: Magnesium: 1.9 mg/dL (ref 1.7–2.4)

## 2018-01-05 MED ORDER — ZOLEDRONIC ACID 4 MG/100ML IV SOLN
4.0000 mg | Freq: Once | INTRAVENOUS | Status: AC
  Administered 2018-01-05: 4 mg via INTRAVENOUS
  Filled 2018-01-05: qty 100

## 2018-01-05 NOTE — Patient Instructions (Signed)

## 2018-01-05 NOTE — Telephone Encounter (Signed)
Last Zolpidem RX: 06/30/17, #30 x 3 refills by [name] Last OV: 07/16/17  Next OV: 01/18/18 UDS: no previous UDS on file CSC: no previous contract on file  Attempted to reach pt and left detailed message on his cell# that request is being denied since [name] has been prescribing medication and pt's PCP is out of the office this week. Medication falls under the controlled substance prescribing rules and will require a controlled substance contract and UDS if PCP will be taking over prescribing this medication. Advised pt to contact Dr Antonieta Pert office for refill at this time.

## 2018-01-05 NOTE — Progress Notes (Signed)
Hematology and Oncology Follow Up Visit  Jorge [name] [id number] 1955/09/05 62 y.o. 01/05/2018   Principle Diagnosis:  IgG Kappa myeloma - Hyperdiploid/+11 DVT of the LEFT and RIGHT leg   Current Therapy:   Revlimid 10 mg po q day (21 on/7 off) - on hold, will restart next week  Zometa 4 mg IV q 3 months- next dose is 12/2017 Xarelto 10 mg PO daily   Interim History:  Jorge [name] is here today with his wife for follow-up.  His primary now is that he is getting cramps.  These are quite severe.  They happen anywhere.  They can happen anytime.  He started to take some magnesium when he takes the Revlimid.  This is helping a little bit.  I am checking his magnesium level.  I need to see if his magnesium is still on the low side.  There is been no nausea or vomiting.  He is had no rashes.  He has had no bleeding.  He is on Xarelto and doing well.  He and his wife have a busy summer.  They will be going out to [place] in July.  He actually is going to be at a myeloma conference.  In August, they will be going over to [place].  They will be going to [place] and then [place].  His myeloma studies that we did back in early May showed an M spike of 0.2 g/dL.  His IgG level is 760 mg/dL.  His kappa light chain is 2.4 mg/dL.  His appetite is doing well.  He is trying to exercise more.  He is having some trouble with his right shoulder.  He is doing some physical therapy for this.     Overall, his performance status is ECOG 1.   Medications:  Allergies as of 01/05/2018   No Known Allergies     Medication List        Accurate as of 01/05/18  8:36 AM. Always use your most recent med list.          albuterol 108 (90 Base) MCG/ACT inhaler Commonly known as:  PROVENTIL HFA;VENTOLIN HFA Inhale 2 puffs into the lungs every 6 (six) hours as needed for wheezing or shortness of breath.   amoxicillin 500 MG tablet Commonly known as:  AMOXIL Take one hour before dental procedure     benzonatate 200 MG capsule Commonly known as:  TESSALON Take 1 capsule (200 mg total) by mouth 3 (three) times daily as needed for cough.   cetirizine 10 MG tablet Commonly known as:  ZYRTEC Take 10 mg by mouth daily as needed for allergies.   cholecalciferol 1000 units tablet Commonly known as:  VITAMIN D Take 1,000 Units by mouth daily.   famciclovir 500 MG tablet Commonly known as:  FAMVIR Take 1 tablet (500 mg total) by mouth daily.   lenalidomide 10 MG capsule Commonly known as:  REVLIMID TAKE 1 CAPSULE BY MOUTH EVERY DAY FOR 21 DAYS, THEN 7 DAYS OFF [id number]   lenalidomide 10 MG capsule Commonly known as:  REVLIMID TAKE 1 CAPSULE BY MOUTH EVERY DAY FOR 21 DAYS, THEN 7 DAYS OFF   lidocaine 5 % Commonly known as:  LIDODERM PLAEC 1 PATCH ONTO THE SKIN DAILY as needed for pain. APPLY 1 PATCH TO THE MOST PAINFUL AREA FOR 12 HR IN A 24 HR PERIOD   magnesium oxide 400 MG tablet Commonly known as:  MAG-OX Take 400 mg by mouth daily.   ondansetron 8 MG disintegrating  tablet Commonly known as:  ZOFRAN-ODT Take 1 tablet (8 mg total) by mouth every 8 (eight) hours as needed.   OVER THE COUNTER MEDICATION Take 1 tablet by mouth daily.   polyethylene glycol powder powder Commonly known as:  MIRALAX Take 17 g by mouth daily.   PROBIOTIC PO Take 1 capsule by mouth daily.   rOPINIRole 0.25 MG tablet Commonly known as:  REQUIP TAKE 1 TABLET BY MOUTH 1 TO 3 HOURS BEFORE BED FOR 2 DAYS THEN INCREASE TO 2 TABS   senna 8.6 MG Tabs tablet Commonly known as:  SENOKOT Take 2 tablets (17.2 mg total) by mouth daily.   sertraline 100 MG tablet Commonly known as:  ZOLOFT Take 100 mg by mouth at bedtime.   XARELTO 10 MG Tabs tablet Generic drug:  rivaroxaban Take 1 tablet (10 mg total) by mouth daily.   zolpidem 10 MG tablet Commonly known as:  AMBIEN Take 1 tablet (10 mg total) by mouth at bedtime as needed.   ZOMETA IV Inject 4 mg into the vein every 3 (three)  months. Receives at [name] office.       Allergies: No Known Allergies  Past Medical History, Surgical history, Social history, and Family History were reviewed and updated.  Review of Systems: Review of Systems  Constitutional: Negative.   HENT: Negative.   Eyes: Negative.   Respiratory: Negative.   Cardiovascular: Negative.   Gastrointestinal: Negative.   Genitourinary: Negative.   Musculoskeletal: Positive for myalgias.  Skin: Negative.   Neurological: Negative.   Endo/Heme/Allergies: Negative.   Psychiatric/Behavioral: Negative.      Physical Exam:  weight is 176 lb 12 oz (80.2 kg). His oral temperature is 97.2 F (36.2 C) (abnormal). His blood pressure is 116/66 and his pulse is 77. His respiration is 18 and oxygen saturation is 99%.   Wt Readings from Last 3 Encounters:  01/05/18 176 lb 12 oz (80.2 kg)  11/18/17 170 lb (77.1 kg)  11/12/17 174 lb 6.1 oz (79.1 kg)    Physical Exam  Constitutional: He is oriented to person, place, and time.  HENT:  Head: Normocephalic and atraumatic.  Mouth/Throat: Oropharynx is clear and moist.  Eyes: Pupils are equal, round, and reactive to light. EOM are normal.  Neck: Normal range of motion.  Cardiovascular: Normal rate, regular rhythm and normal heart sounds.  Pulmonary/Chest: Effort normal and breath sounds normal.  Abdominal: Soft. Bowel sounds are normal.  Musculoskeletal: Normal range of motion. He exhibits no edema, tenderness or deformity.  Lymphadenopathy:    He has no cervical adenopathy.  Neurological: He is alert and oriented to person, place, and time.  Skin: Skin is warm and dry. No rash noted. No erythema.  Psychiatric: He has a normal mood and affect. His behavior is normal. Judgment and thought content normal.  Vitals reviewed.    Lab Results  Component Value Date   WBC 2.7 (L) 01/05/2018   HGB 9.7 (L) 01/05/2018   HCT 31.3 (L) 01/05/2018   MCV 92.6 01/05/2018   PLT 146 01/05/2018   Lab  Results  Component Value Date   FERRITIN 37 08/18/2017   IRON 106 08/18/2017   TIBC 366 08/18/2017   UIBC 260 08/18/2017   IRONPCTSAT 29 (L) 08/18/2017   Lab Results  Component Value Date   RETICCTPCT 1.1 03/05/2017   RBC 3.38 (L) 01/05/2018   Lab Results  Component Value Date   KPAFRELGTCHN 24.0 (H) 11/18/2017   LAMBDASER 12.3 11/18/2017   KAPLAMBRATIO 1.95 (  H) 11/18/2017   Lab Results  Component Value Date   IGGSERUM 760 11/18/2017   IGA 213 11/18/2017   IGMSERUM 28 11/18/2017   Lab Results  Component Value Date   TOTALPROTELP 6.4 11/18/2017   ALBUMINELP 3.3 11/18/2017   A1GS 0.4 11/18/2017   A2GS 0.9 11/18/2017   BETS 1.1 11/18/2017   GAMS 0.7 11/18/2017   MSPIKE 0.2 (H) 11/18/2017     Chemistry      Component Value Date/Time   NA 139 11/18/2017 0814   NA 143 06/30/2017 1049   NA 140 10/03/2016 0845   K 4.1 11/18/2017 0814   K 4.0 06/30/2017 1049   K 4.3 10/03/2016 0845   CL 108 11/18/2017 0814   CL 105 06/30/2017 1049   CO2 27 11/18/2017 0814   CO2 27 06/30/2017 1049   CO2 26 10/03/2016 0845   BUN 13 11/18/2017 0814   BUN 13 06/30/2017 1049   BUN 11.9 10/03/2016 0845   CREATININE 0.90 11/18/2017 0814   CREATININE 1.0 06/30/2017 1049   CREATININE 0.9 10/03/2016 0845      Component Value Date/Time   CALCIUM 9.5 11/18/2017 0814   CALCIUM 9.0 06/30/2017 1049   CALCIUM 9.9 10/03/2016 0845   ALKPHOS 49 11/18/2017 0814   ALKPHOS 57 06/30/2017 1049   ALKPHOS 80 10/03/2016 0845   AST 32 11/18/2017 0814   AST 28 10/03/2016 0845   ALT 53 (H) 11/18/2017 0814   ALT 43 06/30/2017 1049   ALT 26 10/03/2016 0845   BILITOT 0.7 11/18/2017 0814   BILITOT 0.48 10/03/2016 0845      Impression and Plan: Jorge [name] is a pleasant 62 yo gentleman with IgG kappa myeloma.  He ultimately underwent a autologous stem cell transplant at [facility] in February 2018.  Everything looks fine.  His myeloma levels are very low.  Hopefully, we can help with the muscle cramps.  I  am sure that they are from the Revlimid.  We will go ahead with his Zometa.  I would like to see him back before he goes over to [place].  He will see [facility] on August 14.  He will be leave for [place] on August 27.  I will see him back in between those 2 dates.  [name]   [name], MD 6/25/20198:36 AM

## 2018-01-06 ENCOUNTER — Other Ambulatory Visit: Payer: Self-pay | Admitting: *Deleted

## 2018-01-06 ENCOUNTER — Ambulatory Visit: Payer: 59 | Admitting: Physical Therapy

## 2018-01-06 DIAGNOSIS — M25611 Stiffness of right shoulder, not elsewhere classified: Secondary | ICD-10-CM

## 2018-01-06 DIAGNOSIS — M546 Pain in thoracic spine: Secondary | ICD-10-CM

## 2018-01-06 DIAGNOSIS — M25511 Pain in right shoulder: Secondary | ICD-10-CM

## 2018-01-06 DIAGNOSIS — M6281 Muscle weakness (generalized): Secondary | ICD-10-CM

## 2018-01-06 DIAGNOSIS — R293 Abnormal posture: Secondary | ICD-10-CM

## 2018-01-06 DIAGNOSIS — R252 Cramp and spasm: Secondary | ICD-10-CM

## 2018-01-06 DIAGNOSIS — G8929 Other chronic pain: Principal | ICD-10-CM

## 2018-01-06 DIAGNOSIS — G4701 Insomnia due to medical condition: Secondary | ICD-10-CM

## 2018-01-06 LAB — IGG, IGA, IGM
IGA: 215 mg/dL (ref 61–437)
IGM (IMMUNOGLOBULIN M), SRM: 23 mg/dL (ref 20–172)
IgG (Immunoglobin G), Serum: 714 mg/dL (ref 700–1600)

## 2018-01-06 LAB — KAPPA/LAMBDA LIGHT CHAINS
Kappa free light chain: 33.6 mg/L — ABNORMAL HIGH (ref 3.3–19.4)
Kappa, lambda light chain ratio: 2.18 — ABNORMAL HIGH (ref 0.26–1.65)
Lambda free light chains: 15.4 mg/L (ref 5.7–26.3)

## 2018-01-06 MED ORDER — ZOLPIDEM TARTRATE 10 MG PO TABS: 10.0000 mg | ORAL_TABLET | Freq: Every evening | ORAL | 3 refills | Status: DC | PRN

## 2018-01-06 NOTE — Telephone Encounter (Signed)
I reviewed my last note on 07/16/2017. After he finished with rx of Dr. Marin Olp I had planned to be prescriber. So if you could notify patient that I will prescribe 14 day of ambien. But ask him to follow up within 2 weeks so he can sign conrtact/follow controlled substance rules.  So I don't duplicate potential controlled med rx written by Dr. Marin Olp office will you call his office and let him know I got the rx refill request and will give pt limited rx.  Then send me new message so I can go ahead and send that prescirption in.  Thanks for your help.

## 2018-01-06 NOTE — Therapy (Signed)
[facility] [address]  [place], [place], [address] Phone: [phone]   Fax:  [phone]  Physical Therapy Treatment  Patient Details  Name: Jorge [name] MRN: [id number] Date of Birth: 03-07-1956 Referring Provider: Elsie Saas, MD   Encounter Date: 01/06/2018  PT End of Session - 01/06/18 1146    Visit Number  4    Number of Visits  12    Date for PT Re-Evaluation  01/27/18    Authorization Type  [employer]    Authorization - Number of Visits  60    PT Start Time  1101    PT Stop Time  1143    PT Time Calculation (min)  42 min    Activity Tolerance  Patient tolerated treatment well    Behavior During Therapy  [facility] for tasks assessed/performed       Past Medical History:  Diagnosis Date  . Anxiety   . Bone metastasis ([place])   . Chest cold 05/19/2016   productive cough  -- started on antibiotic  . Chronic back pain    due to bone mets from myeloma  . Cough   . Depression   . GERD (gastroesophageal reflux disease)   . Hiatal hernia   . History of chicken pox   . History of concussion    age 25 -- no residual  . History of DVT of lower extremity 03/21/2016  treated and completed w/ xarelto   per doppler left extensive occlusion common femoral, femoral, and popliteal veins and right partial occlusion common femoral and profunda femoral veins/  last doppler 06-04-2016 no evidence acute or chronic dvt noted either leg   . History of radiation therapy 06/09/16-06/23/16   lower thoracic spine 25 Gy in 10 fractions  . Mouth ulcers    secondary to radiation  . Multiple myeloma ([place]) dx 02/22/2016 via bone marrow bx---  oncologist-  [name]   IgG Kappa-- Hyperdiploid/ +11 w/ bone mets--  current treatment chemotherapy (started 08/ 2017)and pallitive radiation to back started 06-09-2016  . Renal calculus, right   . Wears contact lenses     Past Surgical History:  Procedure Laterality Date  . COLONOSCOPY   [id number]  . CYSTOSCOPY W/ URETERAL STENT PLACEMENT Right 06/20/2016   Procedure: CYSTOSCOPY WITH STENT REPLACEMENT;  Surgeon: Kathie Rhodes, MD;  Location: [facility];  Service: Urology;  Laterality: Right;  . CYSTOSCOPY WITH RETROGRADE PYELOGRAM, URETEROSCOPY AND STENT PLACEMENT Right 05/30/2016   Procedure: CYSTOSCOPY WITH RETROGRADE PYELOGRAM, URETEROSCOPY AND STENT PLACEMENT,DILITATION URETERAL STRICTURE;  Surgeon: Kathie Rhodes, MD;  Location: [facility];  Service: Urology;  Laterality: Right;  . CYSTOSCOPY/RETROGRADE/URETEROSCOPY/STONE EXTRACTION WITH BASKET Right 06/20/2016   Procedure: CYSTOSCOPY/URETEROSCOPY/STONE EXTRACTION WITH BASKET;  Surgeon: Kathie Rhodes, MD;  Location: [facility];  Service: Urology;  Laterality: Right;  . HOLMIUM LASER APPLICATION Right 28/12/3815   Procedure: HOLMIUM LASER APPLICATION;  Surgeon: Kathie Rhodes, MD;  Location: [facility];  Service: Urology;  Laterality: Right;  . IR GENERIC HISTORICAL  02/11/2016   IR RADIOLOGIST EVAL & MGMT 02/11/2016 [facility]  . IR GENERIC HISTORICAL  02/15/2016   IR BONE TUMOR(S)RF ABLATION 02/15/2016 Luanne Bras, MD [facility]  . IR GENERIC HISTORICAL  02/15/2016   IR BONE TUMOR(S)RF ABLATION 02/15/2016 Luanne Bras, MD [facility]  . IR GENERIC HISTORICAL  02/15/2016   IR BONE TUMOR(S)RF ABLATION 02/15/2016 Luanne Bras, MD [facility]  . IR GENERIC HISTORICAL  02/15/2016  IR KYPHO THORACIC WITH BONE BIOPSY 02/15/2016 Luanne Bras, MD [facility]  . IR GENERIC HISTORICAL  02/15/2016   IR KYPHO THORACIC WITH BONE BIOPSY 02/15/2016 Luanne Bras, MD [facility]  . IR GENERIC HISTORICAL  02/15/2016   IR VERTEBROPLASTY CERV/THOR BX INC UNI/BIL INC/INJECT/IMAGING 02/15/2016 Luanne Bras, MD [facility]  . IR GENERIC HISTORICAL  03/13/2016   IR KYPHO EA ADDL LEVEL THORACIC OR LUMBAR 03/13/2016 Luanne Bras, MD [facility]  . IR GENERIC HISTORICAL  03/13/2016    IR KYPHO EA ADDL LEVEL THORACIC OR LUMBAR 03/13/2016 Luanne Bras, MD [facility]  . IR GENERIC HISTORICAL  03/13/2016   IR BONE TUMOR(S)RF ABLATION 03/13/2016 Luanne Bras, MD [facility]  . IR GENERIC HISTORICAL  03/13/2016   IR KYPHO LUMBAR INC FX REDUCE BONE BX UNI/BIL CANNULATION INC/IMAGING 03/13/2016 Luanne Bras, MD [facility]  . IR GENERIC HISTORICAL  03/13/2016   IR BONE TUMOR(S)RF ABLATION 03/13/2016 Luanne Bras, MD [facility]  . IR GENERIC HISTORICAL  03/13/2016   IR BONE TUMOR(S)RF ABLATION 03/13/2016 Luanne Bras, MD [facility]  . IR GENERIC HISTORICAL  03/31/2016   IR RADIOLOGIST EVAL & MGMT 03/31/2016 [facility]  . LAPAROSCOPIC INGUINAL HERNIA REPAIR Bilateral 12-16-2013  dr gross  . RADIOLOGY WITH ANESTHESIA N/A 02/15/2016   Procedure: Spinal Ablation;  Surgeon: Luanne Bras, MD;  Location: [facility];  Service: Radiology;  Laterality: N/A;  . RADIOLOGY WITH ANESTHESIA N/A 03/13/2016   Procedure: LUMBER ABLATION;  Surgeon: Luanne Bras, MD;  Location: [facility];  Service: Radiology;  Laterality: N/A;  . ROTATOR CUFF REPAIR Right 2003  . TONSILLECTOMY  age 60  . WISDOM TOOTH EXTRACTION      There were no vitals filed for this visit.  Subjective Assessment - 01/06/18 1147    Subjective  Pt reporting decreasing pain over the last few days after a flare up when he was at the beach last week. Had to jump over a fence and put weight through his R arm to get over the fence and said that his pain got up to an 8/10 when that happened but that over the last few days has decreased to about a 3/10.     Pertinent History  multiple myeloma s/p stem cell transplant 08/22/2016 at [facility] - currently controlled but not in remission - follows up regularly with Dr. Marin Olp; multiple vertebral compression fractures with kyphoplasties and vertbroloplasties    Limitations  House hold activities;Lifting    Diagnostic tests  X-ray completed in MD office revealed some OA in  R shoulder    Patient Stated Goals  "to have more movement in my arm, get rid of most of the pain, and start strengthening my shoulder"    Currently in Pain?  Yes    Pain Score  3  Up to an 8 over the weekend    Pain Location  Shoulder    Pain Orientation  Right;Anterior    Pain Descriptors / Indicators  Sharp    Pain Type  Acute pain;Chronic pain    Pain Radiating Towards  Front of biceps    Pain Onset  1 to 4 weeks ago    Pain Frequency  Intermittent    Aggravating Factors   Hopping over fence bearing weight through R arm    Pain Relieving Factors  Rest    Effect of Pain on Daily Activities  Fearful of pain with some movements including reaching overhead, putting weight through R UE  [facility] Adult PT Treatment/Exercise - 01/06/18 0001      Neck Exercises: Seated   Neck Retraction  10 reps;5 secs      Lumbar Exercises: Aerobic   UBE (Upper Arm Bike)  L3 x 6 min (3 min forward; 3 min backward)      Lumbar Exercises: Quadruped   Other Quadruped Lumbar Exercises  Quadraped Rocking back and forth; 5 reps into each direction      Shoulder Exercises: Seated   Retraction  10 reps      Shoulder Exercises: Standing   ABduction  AROM;10 reps;Both    Retraction  10 reps;Theraband    Theraband Level (Shoulder Retraction)  Level 2 (Red)    Diagonals  Both;10 reps;Theraband    Theraband Level (Shoulder Diagonals)  Level 2 (Red)      Shoulder Exercises: Therapy Ball   Other Therapy Ball Exercises  R shoulder at 90 on small green ball against wall; Rhythmic stablization in each direction; 3 sets of 10 reps      Shoulder Exercises: ROM/Strengthening   Rhythmic Stabilization, Supine         Shoulder Exercises: Body Blade   Flexion  30 seconds;2 reps One rep elbow bent; one rep elbow straight    External Rotation  2 reps;30 seconds      Manual Therapy   Manual Therapy  Joint mobilization    Manual therapy comments  Supine    Joint Mobilization  R  shoulder distraction; anterior glides    Soft tissue mobilization  R short head of biceps tendon               PT Short Term Goals - 12/24/17 1147      PT SHORT TERM GOAL #1   Title  independent with initial HEP    Status  Achieved        PT Long Term Goals - 12/22/17 1451      PT LONG TERM GOAL #1   Title  Independent with ongoing HEP +/- gym program     Status  On-going      PT LONG TERM GOAL #2   Title  R shoulder AROM equivalent to L shoulder w/o pain provocation     Status  On-going      PT LONG TERM GOAL #3   Title  R shoulder strength >/= 4+/5 w/o pain on resistance    Status  On-going      PT LONG TERM GOAL #4   Title  Patient to report ability to perform daily activities including ADLs, reaching and light lifting with R arm w/o pain provocation    Status  On-going      PT LONG TERM GOAL #5   Title  Patient to return to working out w/o limitation due to R shoulder pain    Status  On-going            Plan - 01/06/18 1347    Clinical Impression Statement  Pt did well with treatment today and demonstrated good AROM into abduction with mild limitation in the R shoulder as compared to the L. Pt continues to have difficulty with reaching overhead and pain when having to put weight through the R LE. Has experienced one severe episode of "catching" in the R arm since last visit but overall is improving and causing less pain. Pt will continue to benefit from physical therapy to address strength of R arm musculature and ROM limitations.     Rehab Potential  Good    Clinical Impairments Affecting Rehab Potential  multiple myeloma s/p stem cell transplant 08/22/2016 at [facility] - currently controlled but not in remission; multiple vertebral compression fractures with kyphoplasties and vertbroloplasties    PT Treatment/Interventions  Patient/family education;ADLs/Self Care Home Management;Therapeutic exercise;Therapeutic activities;Neuromuscular re-education;Manual  techniques;Passive range of motion;Dry needling;Taping;Electrical Stimulation;Moist Heat;Cryotherapy;Vasopneumatic Device;Iontophoresis 31m/ml Dexamethasone    Consulted and Agree with Plan of Care  Patient       Patient will benefit from skilled therapeutic intervention in order to improve the following deficits and impairments:  Pain, Postural dysfunction, Decreased range of motion, Decreased strength, Increased muscle spasms, Impaired flexibility, Impaired UE functional use, Decreased activity tolerance, Abnormal gait  Visit Diagnosis: Acute pain of right shoulder  Stiffness of right shoulder, not elsewhere classified  Abnormal posture  Muscle weakness (generalized)  Cramp and spasm  Pain in thoracic spine     Problem List Patient Active Problem List   Diagnosis Date Noted  . Bacteremia 11/10/2017  . Sepsis ([facility] 11/09/2017  . HCAP (healthcare-associated pneumonia) 11/09/2017  . Thrombocytopenia ([facility] 11/09/2017  . Iron deficiency anemia due to chronic blood loss   . Neutropenia ([facility] 03/02/2017  . Cellulitis 03/01/2017  . Phlebitis or thrombophlebitis of lower extremity 07/21/2016  . Visit for preventive health examination 05/16/2016  . Aphthous ulcer 05/16/2016  . Ureterolithiasis 05/16/2016  . Acute deep vein thrombosis (DVT) of femoral vein of left lower extremity ([facility] 03/21/2016  . Multiple myeloma ([facility] 02/22/2016  . Multiple myeloma not having achieved remission ([facility] 02/22/2016  . Bilateral inguinal hernia (BIH), s/p lap repair 12/16/2013 11/02/2013    JShirline Frees SPT 01/06/2018, 1:48 PM  [facility] [place] [address]Phone: [phone]  Fax:  [phone] Name: MEndrit GittinsMRN: [id number] of Birth: 804-14-1957

## 2018-01-07 ENCOUNTER — Other Ambulatory Visit: Payer: Self-pay | Admitting: *Deleted

## 2018-01-07 DIAGNOSIS — C9000 Multiple myeloma not having achieved remission: Secondary | ICD-10-CM

## 2018-01-07 DIAGNOSIS — I8290 Acute embolism and thrombosis of unspecified vein: Secondary | ICD-10-CM

## 2018-01-07 DIAGNOSIS — M545 Low back pain, unspecified: Secondary | ICD-10-CM

## 2018-01-07 DIAGNOSIS — G8929 Other chronic pain: Secondary | ICD-10-CM

## 2018-01-07 DIAGNOSIS — M7989 Other specified soft tissue disorders: Secondary | ICD-10-CM

## 2018-01-07 DIAGNOSIS — D472 Monoclonal gammopathy: Secondary | ICD-10-CM

## 2018-01-07 LAB — PROTEIN ELECTROPHORESIS, SERUM
A/G RATIO SPE: 1.6 (ref 0.7–1.7)
ALPHA-1-GLOBULIN: 0.2 g/dL (ref 0.0–0.4)
ALPHA-2-GLOBULIN: 0.5 g/dL (ref 0.4–1.0)
Albumin ELP: 3.7 g/dL (ref 2.9–4.4)
Beta Globulin: 1 g/dL (ref 0.7–1.3)
GLOBULIN, TOTAL: 2.3 g/dL (ref 2.2–3.9)
Gamma Globulin: 0.6 g/dL (ref 0.4–1.8)
M-Spike, %: 0.1 g/dL — ABNORMAL HIGH
Total Protein ELP: 6 g/dL (ref 6.0–8.5)

## 2018-01-07 MED ORDER — LENALIDOMIDE 10 MG PO CAPS: ORAL_CAPSULE | ORAL | 0 refills | Status: DC

## 2018-01-08 ENCOUNTER — Telehealth: Payer: Self-pay

## 2018-01-08 ENCOUNTER — Ambulatory Visit: Payer: 59 | Admitting: Physical Therapy

## 2018-01-08 ENCOUNTER — Encounter: Payer: Self-pay | Admitting: Physical Therapy

## 2018-01-08 DIAGNOSIS — M25611 Stiffness of right shoulder, not elsewhere classified: Secondary | ICD-10-CM

## 2018-01-08 DIAGNOSIS — M25511 Pain in right shoulder: Secondary | ICD-10-CM | POA: Diagnosis not present

## 2018-01-08 DIAGNOSIS — M6281 Muscle weakness (generalized): Secondary | ICD-10-CM

## 2018-01-08 DIAGNOSIS — R293 Abnormal posture: Secondary | ICD-10-CM

## 2018-01-08 DIAGNOSIS — R252 Cramp and spasm: Secondary | ICD-10-CM

## 2018-01-08 NOTE — Patient Instructions (Signed)

## 2018-01-08 NOTE — Telephone Encounter (Signed)
Received call from pt requesting results of magnesium level and reporting that he continues to have severe muscle cramping.   Per [name], Iris Pert results given to patient. Because pt remains on the low end of the normal range, pt was instructed to double his magnesium supplement daily. Pt and spouse on speaker phone repeat back instructions to increase Magnesium to 800mg  once daily. dph

## 2018-01-08 NOTE — Therapy (Signed)
[facility] [address]  [place], [place], [address] Phone: [phone]   Fax:  [phone]  Physical Therapy Treatment  Patient Details  Name: Jorge [name] MRN: [id number] Date of Birth: [birth year] Referring Provider: Elsie Saas, MD   Encounter Date: 01/08/2018  PT End of Session - 01/08/18 1018    Visit Number  5    Number of Visits  12    Date for PT Re-Evaluation  01/27/18    Authorization Type  UHC    Authorization - Number of Visits  60    PT Start Time  1018    PT Stop Time  1100    PT Time Calculation (min)  42 min    Activity Tolerance  Patient tolerated treatment well    Behavior During Therapy  [facility] for tasks assessed/performed       Past Medical History:  Diagnosis Date  . Anxiety   . Bone metastasis ([place])   . Chest cold 05/19/2016   productive cough  -- started on antibiotic  . Chronic back pain    due to bone mets from myeloma  . Cough   . Depression   . GERD (gastroesophageal reflux disease)   . Hiatal hernia   . History of chicken pox   . History of concussion    age 62 -- no residual  . History of DVT of lower extremity 03/21/2016  treated and completed w/ xarelto   per doppler left extensive occlusion common femoral, femoral, and popliteal veins and right partial occlusion common femoral and profunda femoral veins/  last doppler 06-04-2016 no evidence acute or chronic dvt noted either leg   . History of radiation therapy 06/09/16-06/23/16   lower thoracic spine 25 Gy in 10 fractions  . Mouth ulcers    secondary to radiation  . Multiple myeloma ([place]) dx 02/22/2016 via bone marrow bx---  oncologist-  [name]   IgG Kappa-- Hyperdiploid/ +11 w/ bone mets--  current treatment chemotherapy (started 08/ 2017)and pallitive radiation to back started 06-09-2016  . Renal calculus, right   . Wears contact lenses     Past Surgical History:  Procedure Laterality Date  . COLONOSCOPY   [id number]  . CYSTOSCOPY W/ URETERAL STENT PLACEMENT Right 06/20/2016   Procedure: CYSTOSCOPY WITH STENT REPLACEMENT;  Surgeon: Kathie Rhodes, MD;  Location: [facility];  Service: Urology;  Laterality: Right;  . CYSTOSCOPY WITH RETROGRADE PYELOGRAM, URETEROSCOPY AND STENT PLACEMENT Right 05/30/2016   Procedure: CYSTOSCOPY WITH RETROGRADE PYELOGRAM, URETEROSCOPY AND STENT PLACEMENT,DILITATION URETERAL STRICTURE;  Surgeon: Kathie Rhodes, MD;  Location: [facility];  Service: Urology;  Laterality: Right;  . CYSTOSCOPY/RETROGRADE/URETEROSCOPY/STONE EXTRACTION WITH BASKET Right 06/20/2016   Procedure: CYSTOSCOPY/URETEROSCOPY/STONE EXTRACTION WITH BASKET;  Surgeon: Kathie Rhodes, MD;  Location: [facility];  Service: Urology;  Laterality: Right;  . HOLMIUM LASER APPLICATION Right 87/02/6766   Procedure: HOLMIUM LASER APPLICATION;  Surgeon: Kathie Rhodes, MD;  Location: [facility];  Service: Urology;  Laterality: Right;  . IR GENERIC HISTORICAL  02/11/2016   IR RADIOLOGIST EVAL & MGMT 02/11/2016 [facility]  . IR GENERIC HISTORICAL  02/15/2016   IR BONE TUMOR(S)RF ABLATION 02/15/2016 Luanne Bras, MD [facility]  . IR GENERIC HISTORICAL  02/15/2016   IR BONE TUMOR(S)RF ABLATION 02/15/2016 Luanne Bras, MD [facility]  . IR GENERIC HISTORICAL  02/15/2016   IR BONE TUMOR(S)RF ABLATION 02/15/2016 Luanne Bras, MD [facility]  . IR GENERIC HISTORICAL  02/15/2016  IR KYPHO THORACIC WITH BONE BIOPSY 02/15/2016 Luanne Bras, MD [facility]  . IR GENERIC HISTORICAL  02/15/2016   IR KYPHO THORACIC WITH BONE BIOPSY 02/15/2016 Luanne Bras, MD [facility]  . IR GENERIC HISTORICAL  02/15/2016   IR VERTEBROPLASTY CERV/THOR BX INC UNI/BIL INC/INJECT/IMAGING 02/15/2016 Luanne Bras, MD [facility]  . IR GENERIC HISTORICAL  03/13/2016   IR KYPHO EA ADDL LEVEL THORACIC OR LUMBAR 03/13/2016 Luanne Bras, MD [facility]  . IR GENERIC HISTORICAL  03/13/2016    IR KYPHO EA ADDL LEVEL THORACIC OR LUMBAR 03/13/2016 Luanne Bras, MD [facility]  . IR GENERIC HISTORICAL  03/13/2016   IR BONE TUMOR(S)RF ABLATION 03/13/2016 Luanne Bras, MD [facility]  . IR GENERIC HISTORICAL  03/13/2016   IR KYPHO LUMBAR INC FX REDUCE BONE BX UNI/BIL CANNULATION INC/IMAGING 03/13/2016 Luanne Bras, MD [facility]  . IR GENERIC HISTORICAL  03/13/2016   IR BONE TUMOR(S)RF ABLATION 03/13/2016 Luanne Bras, MD [facility]  . IR GENERIC HISTORICAL  03/13/2016   IR BONE TUMOR(S)RF ABLATION 03/13/2016 Luanne Bras, MD [facility]  . IR GENERIC HISTORICAL  03/31/2016   IR RADIOLOGIST EVAL & MGMT 03/31/2016 [facility]  . LAPAROSCOPIC INGUINAL HERNIA REPAIR Bilateral 12-16-2013  dr gross  . RADIOLOGY WITH ANESTHESIA N/A 02/15/2016   Procedure: Spinal Ablation;  Surgeon: Luanne Bras, MD;  Location: [facility];  Service: Radiology;  Laterality: N/A;  . RADIOLOGY WITH ANESTHESIA N/A 03/13/2016   Procedure: LUMBER ABLATION;  Surgeon: Luanne Bras, MD;  Location: [facility];  Service: Radiology;  Laterality: N/A;  . ROTATOR CUFF REPAIR Right 2003  . TONSILLECTOMY  age 62  . WISDOM TOOTH EXTRACTION      There were no vitals filed for this visit.  Subjective Assessment - 01/08/18 1023    Subjective  Pt reporting pain still typically worst in the morning upon waking from sleeping, up to 2/10 on average. Feels like the "amplitude of the sine wave for    Pertinent History  multiple myeloma s/p stem cell transplant 08/22/2016 at [facility] - currently controlled but not in remission - follows up regularly with Dr. Marin Olp; multiple vertebral compression fractures with kyphoplasties and vertbroloplasties    Diagnostic tests  X-ray completed in MD office revealed some OA in R shoulder    Patient Stated Goals  "to have more movement in my arm, get rid of most of the pain, and start strengthening my shoulder"    Currently in Pain?  Yes    Pain Score  2  0-2/10    Pain  Location  Shoulder    Pain Orientation  Right;Anterior    Pain Descriptors / Indicators  Sharp    Pain Type  Acute pain;Chronic pain                       [facility] Adult PT Treatment/Exercise - 01/08/18 1018      Exercises   Exercises  Shoulder      Shoulder Exercises: Standing   External Rotation  Both;15 reps;Theraband    Theraband Level (Shoulder External Rotation)  Level 3 (Green)    External Rotation Limitations  at 90 dg abduction - cues for scap retraction    Row  Both;15 reps;Strengthening 2 sets    Row Limitations  TRX low & mid row - 1 set each      Shoulder Exercises: ROM/Strengthening   UBE (Upper Arm Bike)  L3.0 x 6' (3' fwd/3' back)      Shoulder  Exercises: IT sales professional  30 seconds;1 rep Statistician Limitations  3 way doorway stretch - better tolerance for high position today      Modalities   Modalities  Iontophoresis      Iontophoresis   Type of Iontophoresis  Dexamethasone    Location  R anterior shoulder (over coracoid process)    Dose  80 mA-min, 1.0 mL    Time  4-6 hr patch (#2 of 6)      Manual Therapy   Manual Therapy  Soft tissue mobilization;Myofascial release;Muscle Energy Technique    Manual therapy comments  supine    Soft tissue mobilization  STM & DTM to R pecs, proximal/mid biceps & posterior shoulder complex    Myofascial Release  manual TPR to R lateral pec major, R short head of biceps, R teres group &  lateral subscapularis    Muscle Energy Technique  gentle contract/relax stretch in R shoulder ER/IR       Trigger Point Dry Needling - 01/08/18 1018    Consent Given?  Yes    Education Handout Provided  Yes    Muscles Treated Upper Body  Pectoralis major & R biceps    Pectoralis Major Response  Twitch response elicited;Palpable increased muscle length             PT Short Term Goals - 12/24/17 1147      PT SHORT TERM GOAL #1   Title  independent with initial HEP    Status  Achieved         PT Long Term Goals - 12/22/17 1451      PT LONG TERM GOAL #1   Title  Independent with ongoing HEP +/- gym program     Status  On-going      PT LONG TERM GOAL #2   Title  R shoulder AROM equivalent to L shoulder w/o pain provocation     Status  On-going      PT LONG TERM GOAL #3   Title  R shoulder strength >/= 4+/5 w/o pain on resistance    Status  On-going      PT LONG TERM GOAL #4   Title  Patient to report ability to perform daily activities including ADLs, reaching and light lifting with R arm w/o pain provocation    Status  On-going      PT LONG TERM GOAL #5   Title  Patient to return to working out w/o limitation due to R shoulder pain    Status  On-going            Plan - 01/08/18 1025    Clinical Impression Statement  [name] reporting pain continues to improve with lower "ampiltude" of sharpness and reduced intensity, typically only 0-2/10 in the mornings. Pain remains primarily localized to R anterior shoulder with continued increased muscle tension and tighness evident in anterior shoulder musculature, esp lateral pecs and short head of biceps, therefore initiated DN in conjunction wit manual therapy after informed verbal consent received from pt. Positive twitch response elicited with palpable decrease in muscle tension following this as well as restoration of near full R shoulder IR & ER and beter tolerance for pe doorway stretch. Pt noting benefit from prior ionto patch, therefore treatment concluded with application 2nd ionto patch.    Rehab Potential  Good    Clinical Impairments Affecting Rehab Potential  multiple myeloma s/p stem cell transplant 08/22/2016 at [facility] - currently controlled  but not in remission; multiple vertebral compression fractures with kyphoplasties and vertbroloplasties    PT Treatment/Interventions  Patient/family education;ADLs/Self Care Home Management;Therapeutic exercise;Therapeutic activities;Neuromuscular re-education;Manual  techniques;Passive range of motion;Dry needling;Taping;Electrical Stimulation;Moist Heat;Cryotherapy;Vasopneumatic Device;Iontophoresis 59m/ml Dexamethasone    Consulted and Agree with Plan of Care  Patient       Patient will benefit from skilled therapeutic intervention in order to improve the following deficits and impairments:  Pain, Postural dysfunction, Decreased range of motion, Decreased strength, Increased muscle spasms, Impaired flexibility, Impaired UE functional use, Decreased activity tolerance, Abnormal gait  Visit Diagnosis: Acute pain of right shoulder  Stiffness of right shoulder, not elsewhere classified  Abnormal posture  Muscle weakness (generalized)  Cramp and spasm     Problem List Patient Active Problem List   Diagnosis Date Noted  . Bacteremia 11/10/2017  . Sepsis ([place] 11/09/2017  . HCAP (healthcare-associated pneumonia) 11/09/2017  . Thrombocytopenia ([place] 11/09/2017  . Iron deficiency anemia due to chronic blood loss   . Neutropenia ([place] 03/02/2017  . Cellulitis 03/01/2017  . Phlebitis or thrombophlebitis of lower extremity 07/21/2016  . Visit for preventive health examination 05/16/2016  . Aphthous ulcer 05/16/2016  . Ureterolithiasis 05/16/2016  . Acute deep vein thrombosis (DVT) of femoral vein of left lower extremity ([place] 03/21/2016  . Multiple myeloma ([place] 02/22/2016  . Multiple myeloma not having achieved remission ([place] 02/22/2016  . Bilateral inguinal hernia (BIH), s/p lap repair 12/16/2013 11/02/2013    JPercival Spanish PT, MPT 01/08/2018, 11:49 AM  [facility] [place] [address]Phone: [phone]  Fax:  [phone] Name: MSulayman ManningMRN: [id number] of Birth: 806-04-1956

## 2018-01-08 NOTE — Telephone Encounter (Signed)
Jorge [name] -- I spoke with Dr Antonieta Pert office and he refills rx on Wednesday for #30 x 3 refills.

## 2018-01-10 NOTE — Telephone Encounter (Signed)
Ok. That will work. Thanks for your help.

## 2018-01-11 ENCOUNTER — Ambulatory Visit: Payer: 59 | Attending: Orthopedic Surgery | Admitting: Physical Therapy

## 2018-01-11 DIAGNOSIS — M25611 Stiffness of right shoulder, not elsewhere classified: Secondary | ICD-10-CM | POA: Insufficient documentation

## 2018-01-11 DIAGNOSIS — M25511 Pain in right shoulder: Secondary | ICD-10-CM | POA: Insufficient documentation

## 2018-01-11 DIAGNOSIS — R252 Cramp and spasm: Secondary | ICD-10-CM | POA: Diagnosis present

## 2018-01-11 DIAGNOSIS — M6281 Muscle weakness (generalized): Secondary | ICD-10-CM | POA: Diagnosis present

## 2018-01-11 DIAGNOSIS — M546 Pain in thoracic spine: Secondary | ICD-10-CM | POA: Insufficient documentation

## 2018-01-11 DIAGNOSIS — R293 Abnormal posture: Secondary | ICD-10-CM | POA: Diagnosis not present

## 2018-01-11 NOTE — Therapy (Signed)
[facility] [address]  [place], [place], [address] Phone: [phone]   Fax:  [phone]  Physical Therapy Treatment  Patient Details  Name: Jorge [name] MRN: [id number] Date of Birth: 05-Apr-1956 Referring Provider: Elsie Saas, MD   Encounter Date: 01/11/2018  PT End of Session - 01/11/18 1113    Visit Number  6    Number of Visits  12    Date for PT Re-Evaluation  01/27/18    Authorization Type  UHC    Authorization - Number of Visits  60    PT Start Time  1102    PT Stop Time  1150    PT Time Calculation (min)  48 min    Activity Tolerance  Patient tolerated treatment well    Behavior During Therapy  [facility] for tasks assessed/performed       Past Medical History:  Diagnosis Date  . Anxiety   . Bone metastasis ([place])   . Chest cold 05/19/2016   productive cough  -- started on antibiotic  . Chronic back pain    due to bone mets from myeloma  . Cough   . Depression   . GERD (gastroesophageal reflux disease)   . Hiatal hernia   . History of chicken pox   . History of concussion    age 25 -- no residual  . History of DVT of lower extremity 03/21/2016  treated and completed w/ xarelto   per doppler left extensive occlusion common femoral, femoral, and popliteal veins and right partial occlusion common femoral and profunda femoral veins/  last doppler 06-04-2016 no evidence acute or chronic dvt noted either leg   . History of radiation therapy 06/09/16-06/23/16   lower thoracic spine 25 Gy in 10 fractions  . Mouth ulcers    secondary to radiation  . Multiple myeloma ([place]) dx 02/22/2016 via bone marrow bx---  oncologist-  [name]   IgG Kappa-- Hyperdiploid/ +11 w/ bone mets--  current treatment chemotherapy (started 08/ 2017)and pallitive radiation to back started 06-09-2016  . Renal calculus, right   . Wears contact lenses     Past Surgical History:  Procedure Laterality Date  . COLONOSCOPY   [id number]  . CYSTOSCOPY W/ URETERAL STENT PLACEMENT Right 06/20/2016   Procedure: CYSTOSCOPY WITH STENT REPLACEMENT;  Surgeon: Kathie Rhodes, MD;  Location: [facility];  Service: Urology;  Laterality: Right;  . CYSTOSCOPY WITH RETROGRADE PYELOGRAM, URETEROSCOPY AND STENT PLACEMENT Right 05/30/2016   Procedure: CYSTOSCOPY WITH RETROGRADE PYELOGRAM, URETEROSCOPY AND STENT PLACEMENT,DILITATION URETERAL STRICTURE;  Surgeon: Kathie Rhodes, MD;  [facility];  Service: Urology;  Laterality: Right;  . CYSTOSCOPY/RETROGRADE/URETEROSCOPY/STONE EXTRACTION WITH BASKET Right 06/20/2016   Procedure: CYSTOSCOPY/URETEROSCOPY/STONE EXTRACTION WITH BASKET;  Surgeon: Kathie Rhodes, MD;  Location: [facility];  Service: Urology;  Laterality: Right;  . HOLMIUM LASER APPLICATION Right 82/11/537   Procedure: HOLMIUM LASER APPLICATION;  Surgeon: Kathie Rhodes, MD;  Location: [facility];  Service: Urology;  Laterality: Right;  . IR GENERIC HISTORICAL  02/11/2016   IR RADIOLOGIST EVAL & MGMT 02/11/2016 [facility]  . IR GENERIC HISTORICAL  02/15/2016   IR BONE TUMOR(S)RF ABLATION 02/15/2016 Luanne Bras, MD [facility]  . IR GENERIC HISTORICAL  02/15/2016   IR BONE TUMOR(S)RF ABLATION 02/15/2016 Luanne Bras, MD [facility]  . IR GENERIC HISTORICAL  02/15/2016   IR BONE TUMOR(S)RF ABLATION 02/15/2016 Luanne Bras, MD [facility]  . IR GENERIC HISTORICAL  02/15/2016  IR KYPHO THORACIC WITH BONE BIOPSY 02/15/2016 Luanne Bras, MD [facility]  . IR GENERIC HISTORICAL  02/15/2016   IR KYPHO THORACIC WITH BONE BIOPSY 02/15/2016 Luanne Bras, MD [facility]  . IR GENERIC HISTORICAL  02/15/2016   IR VERTEBROPLASTY CERV/THOR BX INC UNI/BIL INC/INJECT/IMAGING 02/15/2016 Luanne Bras, MD [facility]  . IR GENERIC HISTORICAL  03/13/2016   IR KYPHO EA ADDL LEVEL THORACIC OR LUMBAR 03/13/2016 Luanne Bras, MD [facility]  . IR GENERIC HISTORICAL  03/13/2016    IR KYPHO EA ADDL LEVEL THORACIC OR LUMBAR 03/13/2016 Luanne Bras, MD [facility]  . IR GENERIC HISTORICAL  03/13/2016   IR BONE TUMOR(S)RF ABLATION 03/13/2016 Luanne Bras, MD [facility]  . IR GENERIC HISTORICAL  03/13/2016   IR KYPHO LUMBAR INC FX REDUCE BONE BX UNI/BIL CANNULATION INC/IMAGING 03/13/2016 Luanne Bras, MD [facility]  . IR GENERIC HISTORICAL  03/13/2016   IR BONE TUMOR(S)RF ABLATION 03/13/2016 Luanne Bras, MD [facility]  . IR GENERIC HISTORICAL  03/13/2016   IR BONE TUMOR(S)RF ABLATION 03/13/2016 Luanne Bras, MD [facility]  . IR GENERIC HISTORICAL  03/31/2016   IR RADIOLOGIST EVAL & MGMT 03/31/2016 [facility]  . LAPAROSCOPIC INGUINAL HERNIA REPAIR Bilateral 12-16-2013  dr gross  . RADIOLOGY WITH ANESTHESIA N/A 02/15/2016   Procedure: Spinal Ablation;  Surgeon: Luanne Bras, MD;  Location: [facility];  Service: Radiology;  Laterality: N/A;  . RADIOLOGY WITH ANESTHESIA N/A 03/13/2016   Procedure: LUMBER ABLATION;  Surgeon: Luanne Bras, MD;  Location: [facility];  Service: Radiology;  Laterality: N/A;  . ROTATOR CUFF REPAIR Right 2003  . TONSILLECTOMY  age 56  . WISDOM TOOTH EXTRACTION      There were no vitals filed for this visit.  Subjective Assessment - 01/11/18 1104    Subjective  Pt reports that the shoulder did much better after the dry needling and iontophoresis patch last session. He had almost no pain on Saturday (2 days ago) but has been gradually increasing since Saturday because he was working on the computer all day on Sunday (yesterday).     Pertinent History  multiple myeloma s/p stem cell transplant 08/22/2016 at [facility] - currently controlled but not in remission - follows up regularly with Dr. Marin Olp; multiple vertebral compression fractures with kyphoplasties and vertbroloplasties    Limitations  House hold activities;Lifting    Diagnostic tests  X-ray completed in MD office revealed some OA in R shoulder    Patient Stated Goals   "to have more movement in my arm, get rid of most of the pain, and start strengthening my shoulder"    Currently in Pain?  Yes    Pain Score  2     Pain Location  Shoulder    Pain Orientation  Right;Anterior    Pain Descriptors / Indicators  Dull    Pain Type  Acute pain;Chronic pain    Pain Onset  1 to 4 weeks ago    Pain Frequency  Intermittent    Aggravating Factors   Typing on the computer for long periods of time; pain gets worse towards the end of the day                       [facility] Adult PT Treatment/Exercise - 01/11/18 0001      Exercises   Exercises  Shoulder      Lumbar Exercises: Aerobic   UBE (Upper Arm Bike)  --      Shoulder Exercises: Prone  Retraction  --    Flexion  --    Extension  Both;10 reps    Extension Limitations  "I" prone over green Pball    External Rotation  Both;10 reps    External Rotation Limitations  "W" prone over green Pball    Horizontal ABduction 1  Both;10 reps Over Green PB    Horizontal ABduction 1 Limitations  "T" prone over green Pball    Horizontal ABduction 2  Both;10 reps    Horizontal ABduction 2 Limitations  "Y" prone over green Pball      Shoulder Exercises: Sidelying   External Rotation  Right;15 reps;Strengthening 2 x 15 reps; 2 set with 2# weight    External Rotation Limitations  verbal & tacile cues for sacp retraction    Flexion  Right;15 reps with TC to prevent R scapular winging      Shoulder Exercises: Standing   Retraction  Right;15 reps With downward depression at end range    Theraband Level (Shoulder Retraction)  Level 2 (Red)      Shoulder Exercises: Therapy Ball   Flexion  Both;15 reps    Flexion Limitations  Orange PB; 1# weight on R wrist    Scaption  Right;15 reps    Scaption Limitations  Orange PB; 1# weight on R wrist    Right/Left  --    Right/Left Limitations  --      Shoulder Exercises: ROM/Strengthening   UBE (Upper Arm Bike)  L3.0 x 6' (3' fwd/3' back)      Shoulder Exercises:  Stretch   Other Shoulder Stretches  R lat stretch in L sidelying 2 x 30 seconds with PT assist      Manual Therapy   Manual Therapy  Joint mobilization;Soft tissue mobilization;Scapular mobilization    Manual therapy comments  Supine; L Sidelying    Joint Mobilization  R shoulder Inferior and A/P glenohumeral mobs - grade 3    Soft tissue mobilization  R Biceps, anterior/middle delt, Teres group, Lats    Scapular Mobilization  R protraction/retraction mobilization through available ROM             PT Education - 01/11/18 1112    Education Details  Pt educated on posture while sitting at the computer and discussed lap desk for when using computer while sitting in recliner    Person(s) Educated  Patient    Methods  Explanation    Comprehension  Verbalized understanding       PT Short Term Goals - 12/24/17 1147      PT SHORT TERM GOAL #1   Title  independent with initial HEP    Status  Achieved        PT Long Term Goals - 12/22/17 1451      PT LONG TERM GOAL #1   Title  Independent with ongoing HEP +/- gym program     Status  On-going      PT LONG TERM GOAL #2   Title  R shoulder AROM equivalent to L shoulder w/o pain provocation     Status  On-going      PT LONG TERM GOAL #3   Title  R shoulder strength >/= 4+/5 w/o pain on resistance    Status  On-going      PT LONG TERM GOAL #4   Title  Patient to report ability to perform daily activities including ADLs, reaching and light lifting with R arm w/o pain provocation    Status  On-going  PT LONG TERM GOAL #5   Title  Patient to return to working out w/o limitation due to R shoulder pain    Status  On-going            Plan - 01/11/18 1150    Clinical Impression Statement  Jorge [name] reporting good relief of pain following DN/manual therapy and iontophoresis patch last session with pain returning ~Sunday when he spent a good part of the day on his computer. Pt reports he most commonly uses his iPad on his lap  while sitting in a recliner - discussed ideal positioning for computer use and recommended pt look into lap desk or similar option to promote better shoulder posture while working on computer/iPad. Manual therapy and therapeutic activities/exercises continue to focus on proper postural alignment and scapular strengthening to promote improved glenohumeral alignment to allow for improved shoudler ROM w/o pain interference with pt noting fatigue with some exercises but no pain by end of session.    Rehab Potential  Good    Clinical Impairments Affecting Rehab Potential  multiple myeloma s/p stem cell transplant 08/22/2016 at [facility] - currently controlled but not in remission; multiple vertebral compression fractures with kyphoplasties and vertbroloplasties    PT Treatment/Interventions  Patient/family education;ADLs/Self Care Home Management;Therapeutic exercise;Therapeutic activities;Neuromuscular re-education;Manual techniques;Passive range of motion;Dry needling;Taping;Electrical Stimulation;Moist Heat;Cryotherapy;Vasopneumatic Device;Iontophoresis 64m/ml Dexamethasone    Consulted and Agree with Plan of Care  Patient       Patient will benefit from skilled therapeutic intervention in order to improve the following deficits and impairments:  Pain, Postural dysfunction, Decreased range of motion, Decreased strength, Increased muscle spasms, Impaired flexibility, Impaired UE functional use, Decreased activity tolerance, Abnormal gait  Visit Diagnosis: Acute pain of right shoulder  Stiffness of right shoulder, not elsewhere classified  Abnormal posture  Muscle weakness (generalized)  Cramp and spasm  Pain in thoracic spine     Problem List Patient Active Problem List   Diagnosis Date Noted  . Bacteremia 11/10/2017  . Sepsis ([place] 11/09/2017  . HCAP (healthcare-associated pneumonia) 11/09/2017  . Thrombocytopenia ([place] 11/09/2017  . Iron deficiency anemia due to chronic blood loss   .  Neutropenia ([place] 03/02/2017  . Cellulitis 03/01/2017  . Phlebitis or thrombophlebitis of lower extremity 07/21/2016  . Visit for preventive health examination 05/16/2016  . Aphthous ulcer 05/16/2016  . Ureterolithiasis 05/16/2016  . Acute deep vein thrombosis (DVT) of femoral vein of left lower extremity ([place]Helena Fl[place] 03/21/2016  . Multiple myeloma ([place] 02/22/2016  . Multiple myeloma not having achieved remission ([place] 02/22/2016  . Bilateral inguinal hernia (BIH), s/p lap repair 12/16/2013 11/02/2013    Jorge [name] PT, MPT 01/11/2018, 12:28 PM  [facility] [place] [address]Phone: [phone]  Fax:  [phone] Name: MTino RonanMRN: [id number] of Birth: [birth year]

## 2018-01-13 ENCOUNTER — Ambulatory Visit: Payer: 59 | Admitting: Physical Therapy

## 2018-01-13 ENCOUNTER — Encounter: Payer: Self-pay | Admitting: Physical Therapy

## 2018-01-13 DIAGNOSIS — M6281 Muscle weakness (generalized): Secondary | ICD-10-CM

## 2018-01-13 DIAGNOSIS — M25511 Pain in right shoulder: Secondary | ICD-10-CM | POA: Diagnosis not present

## 2018-01-13 DIAGNOSIS — R293 Abnormal posture: Secondary | ICD-10-CM

## 2018-01-13 DIAGNOSIS — R252 Cramp and spasm: Secondary | ICD-10-CM

## 2018-01-13 DIAGNOSIS — M25611 Stiffness of right shoulder, not elsewhere classified: Secondary | ICD-10-CM

## 2018-01-13 NOTE — Therapy (Signed)
[facility] [address]  [place], [place], [address] Phone: [phone]   Fax:  [phone]  Physical Therapy Treatment  Patient Details  Name: Jorge [name] MRN: [id number] Date of Birth: [birth year] Referring Provider: Elsie Saas, MD   Encounter Date: 01/13/2018  PT End of Session - 01/13/18 1201    Visit Number  7    Number of Visits  12    Date for PT Re-Evaluation  01/27/18    Authorization Type  [employer]    Authorization - Number of Visits  60    PT Start Time  1101    PT Stop Time  1140    PT Time Calculation (min)  39 min    Activity Tolerance  Patient tolerated treatment well    Behavior During Therapy  [facility] for tasks assessed/performed       Past Medical History:  Diagnosis Date  . Anxiety   . Bone metastasis ([place])   . Chest cold 05/19/2016   productive cough  -- started on antibiotic  . Chronic back pain    due to bone mets from myeloma  . Cough   . Depression   . GERD (gastroesophageal reflux disease)   . Hiatal hernia   . History of chicken pox   . History of concussion    age 51 -- no residual  . History of DVT of lower extremity 03/21/2016  treated and completed w/ xarelto   per doppler left extensive occlusion common femoral, femoral, and popliteal veins and right partial occlusion common femoral and profunda femoral veins/  last doppler 06-04-2016 no evidence acute or chronic dvt noted either leg   . History of radiation therapy 06/09/16-06/23/16   lower thoracic spine 25 Gy in 10 fractions  . Mouth ulcers    secondary to radiation  . Multiple myeloma ([place]) dx 02/22/2016 via bone marrow bx---  oncologist-  [name]   IgG Kappa-- Hyperdiploid/ +11 w/ bone mets--  current treatment chemotherapy (started 08/ 2017)and pallitive radiation to back started 06-09-2016  . Renal calculus, right   . Wears contact lenses     Past Surgical History:  Procedure Laterality Date  . COLONOSCOPY   [id number]  . CYSTOSCOPY W/ URETERAL STENT PLACEMENT Right 06/20/2016   Procedure: CYSTOSCOPY WITH STENT REPLACEMENT;  Surgeon: Kathie Rhodes, MD;  Location[facility];  Service: Urology;  Laterality: Right;  . CYSTOSCOPY WITH RETROGRADE PYELOGRAM, URETEROSCOPY AND STENT PLACEMENT Right 05/30/2016   Procedure: CYSTOSCOPY WITH RETROGRADE PYELOGRAM, URETEROSCOPY AND STENT PLACEMENT,DILITATION URETERAL STRICTURE;  Surgeon: Kathie Rhodes, MD;  Location: [facility];  Service: Urology;  Laterality: Right;  . CYSTOSCOPY/RETROGRADE/URETEROSCOPY/STONE EXTRACTION WITH BASKET Right 06/20/2016   Procedure: CYSTOSCOPY/URETEROSCOPY/STONE EXTRACTION WITH BASKET;  Surgeon: Kathie Rhodes, MD;  Location: [facility];  Service: Urology;  Laterality: Right;  . HOLMIUM LASER APPLICATION Right 30/07/6008   Procedure: HOLMIUM LASER APPLICATION;  Surgeon: Kathie Rhodes, MD;  Location: [facility];  Service: Urology;  Laterality: Right;  . IR GENERIC HISTORICAL  02/11/2016   IR RADIOLOGIST EVAL & MGMT 02/11/2016 [facility]  . IR GENERIC HISTORICAL  02/15/2016   IR BONE TUMOR(S)RF ABLATION 02/15/2016 Luanne Bras, MD [facility]  . IR GENERIC HISTORICAL  02/15/2016   IR BONE TUMOR(S)RF ABLATION 02/15/2016 Luanne Bras, MD [facility]  . IR GENERIC HISTORICAL  02/15/2016   IR BONE TUMOR(S)RF ABLATION 02/15/2016 Luanne Bras, MD [facility]  . IR GENERIC HISTORICAL  02/15/2016  IR KYPHO THORACIC WITH BONE BIOPSY 02/15/2016 Luanne Bras, MD [facility]  . IR GENERIC HISTORICAL  02/15/2016   IR KYPHO THORACIC WITH BONE BIOPSY 02/15/2016 Luanne Bras, MD [facility]  . IR GENERIC HISTORICAL  02/15/2016   IR VERTEBROPLASTY CERV/THOR BX INC UNI/BIL INC/INJECT/IMAGING 02/15/2016 Luanne Bras, MD [facility]  . IR GENERIC HISTORICAL  03/13/2016   IR KYPHO EA ADDL LEVEL THORACIC OR LUMBAR 03/13/2016 Luanne Bras, MD [facility]  . IR GENERIC HISTORICAL  03/13/2016    IR KYPHO EA ADDL LEVEL THORACIC OR LUMBAR 03/13/2016 Luanne Bras, MD [facility]  . IR GENERIC HISTORICAL  03/13/2016   IR BONE TUMOR(S)RF ABLATION 03/13/2016 Luanne Bras, MD [facility]  . IR GENERIC HISTORICAL  03/13/2016   IR KYPHO LUMBAR INC FX REDUCE BONE BX UNI/BIL CANNULATION INC/IMAGING 03/13/2016 Luanne Bras, MD [facility]  . IR GENERIC HISTORICAL  03/13/2016   IR BONE TUMOR(S)RF ABLATION 03/13/2016 Luanne Bras, MD [facility]  . IR GENERIC HISTORICAL  03/13/2016   IR BONE TUMOR(S)RF ABLATION 03/13/2016 Luanne Bras, MD [facility]  . IR GENERIC HISTORICAL  03/31/2016   IR RADIOLOGIST EVAL & MGMT 03/31/2016 [facility]  . LAPAROSCOPIC INGUINAL HERNIA REPAIR Bilateral 12-16-2013  dr gross  . RADIOLOGY WITH ANESTHESIA N/A 02/15/2016   Procedure: Spinal Ablation;  Surgeon: Luanne Bras, MD;  Location: [facility];  Service: Radiology;  Laterality: N/A;  . RADIOLOGY WITH ANESTHESIA N/A 03/13/2016   Procedure: LUMBER ABLATION;  Surgeon: Luanne Bras, MD;  Location: [facility];  Service: Radiology;  Laterality: N/A;  . ROTATOR CUFF REPAIR Right 2003  . TONSILLECTOMY  age 17  . WISDOM TOOTH EXTRACTION      There were no vitals filed for this visit.  Subjective Assessment - 01/13/18 1102    Subjective  Pt reports that he is doing well today and has no pain in the shoulder. Was a little sore after the last treatment session but a "good kind of sore". Has not had an episode of catching since the last treatment session.     Pertinent History  multiple myeloma s/p stem cell transplant 08/22/2016 at [facility] - currently controlled but not in remission - follows up regularly with Dr. Marin Olp; multiple vertebral compression fractures with kyphoplasties and vertbroloplasties    Limitations  House hold activities;Lifting    Diagnostic tests  X-ray completed in MD office revealed some OA in R shoulder    Patient Stated Goals  "to have more movement in my arm, get rid of most of  the pain, and start strengthening my shoulder"    Currently in Pain?  No/denies    Pain Score  0-No pain                       [facility] Adult PT Treatment/Exercise - 01/13/18 0001      Shoulder Exercises: Supine   Protraction  10 reps;Weights    Protraction Weight (lbs)  Red Med ball    Other Supine Exercises  R UE extended  at 90 degrees of flexion holding red medicine ball; Writing alphabet x 2      Shoulder Exercises: Seated   Retraction  10 reps;Both    Row  --    Row Weight (lbs)  --    Row Limitations  --      Shoulder Exercises: Standing   Other Standing Exercises  --      Shoulder Exercises: Therapy Copywriter, advertising Exercises  Orange PB at shoulder height on wall; Push up (+); 10 reps    Other Therapy Ball Exercises  Serratus roll-ups on Orange PB; 2 x 5 reps; second set with red TB wrapped around wrists      Shoulder Exercises: ROM/Strengthening   UBE (Upper Arm Bike)  L 3 x 6 min (3 min forward/3 min backward)    Cybex Row  15 reps;Limitations    Cybex Row Limitations  2 x 15 reps; 20#    Pendulum  R arm pendulum forward backward and side/side with 2# weight in hand; 1 min each      Shoulder Exercises: Stretch   Internal Rotation Stretch  3 reps    External Rotation Stretch  3 reps;30 seconds    Internal Rotation Stretch Limitations  30 seconds    External Rotation Stretch Limitations  UEs in Butterfly position with hands behind the head      Manual Therapy   Manual Therapy  Joint mobilization;Scapular mobilization;Manual Traction    Manual therapy comments  Sitting; PT on R side    Joint Mobilization  R glenohumeral MWM inferior/posterior glide with distraction; 15 reps flexion, 15 reps horizontal abduction    Scapular Mobilization  R protraction/retraction in sitting; 2 min               PT Short Term Goals - 12/24/17 1147      PT SHORT TERM GOAL #1   Title  independent with initial HEP    Status  Achieved        PT Long  Term Goals - 12/22/17 1451      PT LONG TERM GOAL #1   Title  Independent with ongoing HEP +/- gym program     Status  On-going      PT LONG TERM GOAL #2   Title  R shoulder AROM equivalent to L shoulder w/o pain provocation     Status  On-going      PT LONG TERM GOAL #3   Title  R shoulder strength >/= 4+/5 w/o pain on resistance    Status  On-going      PT LONG TERM GOAL #4   Title  Patient to report ability to perform daily activities including ADLs, reaching and light lifting with R arm w/o pain provocation    Status  On-going      PT LONG TERM GOAL #5   Title  Patient to return to working out w/o limitation due to R shoulder pain    Status  On-going            Plan - 01/13/18 1201    Clinical Impression Statement  Pt continues to progress and reports less pain at each session and less incidence of "catching" pain. Pt tolerance to activity and strength is improving and he demonstrates better form with therapeutic exercise, but still continues to require cueing for correct sitting posture. Pt will continue to benefit from physical therapy to address ROM limitations and continue to address strength of the R UE to improve functional mobility.     Clinical Impairments Affecting Rehab Potential  multiple myeloma s/p stem cell transplant 08/22/2016 at [facility] - currently controlled but not in remission; multiple vertebral compression fractures with kyphoplasties and vertbroloplasties    PT Treatment/Interventions  Patient/family education;ADLs/Self Care Home Management;Therapeutic exercise;Therapeutic activities;Neuromuscular re-education;Manual techniques;Passive range of motion;Dry needling;Taping;Electrical Stimulation;Moist Heat;Cryotherapy;Vasopneumatic Device;Iontophoresis 1m/ml Dexamethasone    Consulted and Agree with Plan of Care  Patient  Patient will benefit from skilled therapeutic intervention in order to improve the following deficits and impairments:  Pain, Postural  dysfunction, Decreased range of motion, Decreased strength, Increased muscle spasms, Impaired flexibility, Impaired UE functional use, Decreased activity tolerance, Abnormal gait  Visit Diagnosis: Acute pain of right shoulder  Stiffness of right shoulder, not elsewhere classified  Abnormal posture  Muscle weakness (generalized)  Cramp and spasm     Problem List Patient Active Problem List   Diagnosis Date Noted  . Bacteremia 11/10/2017  . Sepsis ([place]) 11/09/2017  . HCAP (healthcare-associated pneumonia) 11/09/2017  . Thrombocytopenia ([place]) 11/09/2017  . Iron deficiency anemia due to chronic blood loss   . Neutropenia ([place]) 03/02/2017  . Cellulitis 03/01/2017  . Phlebitis or thrombophlebitis of lower extremity 07/21/2016  . Visit for preventive health examination 05/16/2016  . Aphthous ulcer 05/16/2016  . Ureterolithiasis 05/16/2016  . Acute deep vein thrombosis (DVT) of femoral vein of left lower extremity ([place]) 03/21/2016  . Multiple myeloma ([place]) 02/22/2016  . Multiple myeloma not having achieved remission ([place]) 02/22/2016  . Bilateral inguinal hernia (BIH), s/p lap repair 12/16/2013 11/02/2013    Shirline Frees, SPT 01/13/2018, 12:12 PM  [facility] [address]  [place], [place], [address] Phone: [phone]   Fax:  [phone]  Name: Jorge [name] MRN: [id number] Date of Birth: [birth year]

## 2018-01-14 ENCOUNTER — Other Ambulatory Visit: Payer: Self-pay | Admitting: Hematology & Oncology

## 2018-01-14 DIAGNOSIS — D472 Monoclonal gammopathy: Secondary | ICD-10-CM

## 2018-01-14 DIAGNOSIS — G8929 Other chronic pain: Secondary | ICD-10-CM

## 2018-01-14 DIAGNOSIS — I8290 Acute embolism and thrombosis of unspecified vein: Secondary | ICD-10-CM

## 2018-01-14 DIAGNOSIS — M7989 Other specified soft tissue disorders: Secondary | ICD-10-CM

## 2018-01-14 DIAGNOSIS — M545 Low back pain: Secondary | ICD-10-CM

## 2018-01-14 DIAGNOSIS — C9000 Multiple myeloma not having achieved remission: Secondary | ICD-10-CM

## 2018-01-18 ENCOUNTER — Encounter: Payer: Self-pay | Admitting: Medical

## 2018-01-18 ENCOUNTER — Ambulatory Visit (INDEPENDENT_AMBULATORY_CARE_PROVIDER_SITE_OTHER): Payer: 59 | Admitting: Medical

## 2018-01-18 VITALS — BP 103/64 | HR 70 | Temp 98.1°F | Resp 16 | Ht 67.0 in | Wt 178.0 lb

## 2018-01-18 DIAGNOSIS — Z125 Encounter for screening for malignant neoplasm of prostate: Secondary | ICD-10-CM | POA: Diagnosis not present

## 2018-01-18 DIAGNOSIS — Z0000 Encounter for general adult medical examination without abnormal findings: Secondary | ICD-10-CM | POA: Diagnosis not present

## 2018-01-18 DIAGNOSIS — R5383 Other fatigue: Secondary | ICD-10-CM | POA: Diagnosis not present

## 2018-01-18 NOTE — Progress Notes (Signed)
Subjective:    Patient ID: Jorge [name], male    DOB: Jan 30, 1956, 62 y.o.   MRN: [id number]  HPI  Pt is in for follow up.  On review no recent CPE. And no lipid panel on review.   So decided to do physical. Pt ok with getting cpe today.  He walks about 3 miles. Diet is healthy. No major weight lifting but does go to gym.  Pt has history of insomnia. He got refill of ambien  from Dr. Marin Olp..  In April he did have pneumonia. Recovered from that well.  He plans to travel to [place] end of the summer.  Pt states he gets vaccine with specialist. If he is not up to date with tdap  then we can give. He is aware he could call and scheudule nurse viist.  Up to date on colonosocopy.   Review of Systems  Constitutional: Negative for chills, fatigue and fever.  Respiratory: Negative for cough, chest tightness, shortness of breath and wheezing.   Cardiovascular: Negative for chest pain and palpitations.  Gastrointestinal: Negative for abdominal pain, blood in stool and constipation.  Musculoskeletal: Negative for back pain and gait problem.  Neurological: Negative for dizziness, seizures, speech difficulty, weakness and light-headedness.  Hematological: Negative for adenopathy. Does not bruise/bleed easily.  Psychiatric/Behavioral: Negative for behavioral problems, confusion, dysphoric mood and suicidal ideas. The patient is not nervous/anxious.     Past Medical History:  Diagnosis Date  . Anxiety   . Bone metastasis ([place])   . Chest cold 05/19/2016   productive cough  -- started on antibiotic  . Chronic back pain    due to bone mets from myeloma  . Cough   . Depression   . GERD (gastroesophageal reflux disease)   . Hiatal hernia   . History of chicken pox   . History of concussion    age 33 -- no residual  . History of DVT of lower extremity 03/21/2016  treated and completed w/ xarelto   per doppler left extensive occlusion common femoral, femoral, and popliteal veins and  right partial occlusion common femoral and profunda femoral veins/  last doppler 06-04-2016 no evidence acute or chronic dvt noted either leg   . History of radiation therapy 06/09/16-06/23/16   lower thoracic spine 25 Gy in 10 fractions  . Mouth ulcers    secondary to radiation  . Multiple myeloma ([place]) dx 02/22/2016 via bone marrow bx---  oncologist-  [name]   IgG Kappa-- Hyperdiploid/ +11 w/ bone mets--  current treatment chemotherapy (started 08/ 2017)and pallitive radiation to back started 06-09-2016  . Renal calculus, right   . Wears contact lenses      Social History   Socioeconomic History  . Marital status: Married    Spouse name: Not on file  . Number of children: 3  . Years of education: Not on file  . Highest education level: Not on file  Occupational History  . Not on file  Social Needs  . Financial resource strain: Not on file  . Food insecurity:    Worry: Not on file    Inability: Not on file  . Transportation needs:    Medical: Not on file    Non-medical: Not on file  Tobacco Use  . Smoking status: Former Smoker    Packs/day: 1.00    Years: 7.00    Pack years: 7.00    Types: Cigarettes    Last attempt to quit: 11/02/1980    Years since  quitting: 37.2  . Smokeless tobacco: Never Used  Substance and Sexual Activity  . Alcohol use: Yes    Comment: occasional  . Drug use: No  . Sexual activity: Never  Lifestyle  . Physical activity:    Days per week: Not on file    Minutes per session: Not on file  . Stress: Not on file  Relationships  . Social connections:    Talks on phone: Not on file    Gets together: Not on file    Attends religious service: Not on file    Active member of club or organization: Not on file    Attends meetings of clubs or organizations: Not on file    Relationship status: Not on file  . Intimate partner violence:    Fear of current or ex partner: Not on file    Emotionally abused: Not on file    Physically abused: Not on  file    Forced sexual activity: Not on file  Other Topics Concern  . Not on file  Social History Narrative  . Not on file    Past Surgical History:  Procedure Laterality Date  . COLONOSCOPY  [id number]  . CYSTOSCOPY W/ URETERAL STENT PLACEMENT Right 06/20/2016   Procedure: CYSTOSCOPY WITH STENT REPLACEMENT;  Surgeon: Kathie Rhodes, MD;  Location[facility];  Service: Urology;  Laterality: Right;  . CYSTOSCOPY WITH RETROGRADE PYELOGRAM, URETEROSCOPY AND STENT PLACEMENT Right 05/30/2016   Procedure: CYSTOSCOPY WITH RETROGRADE PYELOGRAM, URETEROSCOPY AND STENT PLACEMENT,DILITATION URETERAL STRICTURE;  Surgeon: Kathie Rhodes, MD;  Location: [facility];  Service: Urology;  Laterality: Right;  . CYSTOSCOPY/RETROGRADE/URETEROSCOPY/STONE EXTRACTION WITH BASKET Right 06/20/2016   Procedure: CYSTOSCOPY/URETEROSCOPY/STONE EXTRACTION WITH BASKET;  Surgeon: Kathie Rhodes, MD;  Location: [facility];  Service: Urology;  Laterality: Right;  . HOLMIUM LASER APPLICATION Right 38/02/8279   Procedure: HOLMIUM LASER APPLICATION;  Surgeon: Kathie Rhodes, MD;  Location: [facility];  Service: Urology;  Laterality: Right;  . IR GENERIC HISTORICAL  02/11/2016   IR RADIOLOGIST EVAL & MGMT 02/11/2016 [facility]  . IR GENERIC HISTORICAL  02/15/2016   IR BONE TUMOR(S)RF ABLATION 02/15/2016 Luanne Bras, MD [facility]  . IR GENERIC HISTORICAL  02/15/2016   IR BONE TUMOR(S)RF ABLATION 02/15/2016 Luanne Bras, MD [facility]  . IR GENERIC HISTORICAL  02/15/2016   IR BONE TUMOR(S)RF ABLATION 02/15/2016 Luanne Bras, MD [facility]  . IR GENERIC HISTORICAL  02/15/2016   IR KYPHO THORACIC WITH BONE BIOPSY 02/15/2016 Luanne Bras, MD [facility]  . IR GENERIC HISTORICAL  02/15/2016   IR KYPHO THORACIC WITH BONE BIOPSY 02/15/2016 Luanne Bras, MD [facility]  . IR GENERIC HISTORICAL  02/15/2016   IR VERTEBROPLASTY CERV/THOR BX INC UNI/BIL INC/INJECT/IMAGING 02/15/2016  Luanne Bras, MD [facility]  . IR GENERIC HISTORICAL  03/13/2016   IR KYPHO EA ADDL LEVEL THORACIC OR LUMBAR 03/13/2016 Luanne Bras, MD [facility]  . IR GENERIC HISTORICAL  03/13/2016   IR KYPHO EA ADDL LEVEL THORACIC OR LUMBAR 03/13/2016 Luanne Bras, MD [facility]  . IR GENERIC HISTORICAL  03/13/2016   IR BONE TUMOR(S)RF ABLATION 03/13/2016 Luanne Bras, MD [facility]  . IR GENERIC HISTORICAL  03/13/2016   IR KYPHO LUMBAR INC FX REDUCE BONE BX UNI/BIL CANNULATION INC/IMAGING 03/13/2016 Luanne Bras, MD [facility]  . IR GENERIC HISTORICAL  03/13/2016   IR BONE TUMOR(S)RF ABLATION 03/13/2016 Luanne Bras, MD [facility]  . IR GENERIC HISTORICAL  03/13/2016   IR BONE TUMOR(S)RF ABLATION  03/13/2016 Luanne Bras, MD [facility]  . IR GENERIC HISTORICAL  03/31/2016   IR RADIOLOGIST EVAL & MGMT 03/31/2016 [facility]  . LAPAROSCOPIC INGUINAL HERNIA REPAIR Bilateral 12-16-2013  dr gross  . RADIOLOGY WITH ANESTHESIA N/A 02/15/2016   Procedure: Spinal Ablation;  Surgeon: Luanne Bras, MD;  Location: [facility];  Service: Radiology;  Laterality: N/A;  . RADIOLOGY WITH ANESTHESIA N/A 03/13/2016   Procedure: LUMBER ABLATION;  Surgeon: Luanne Bras, MD;  Location: [facility];  Service: Radiology;  Laterality: N/A;  . ROTATOR CUFF REPAIR Right 2003  . TONSILLECTOMY  age 64  . WISDOM TOOTH EXTRACTION      Family History  Problem Relation Age of Onset  . Uterine cancer Mother   . Heart disease Father   . Hypertension Father   . Multiple sclerosis Sister   . Paranoid behavior Brother   . Drug abuse Brother   . Schizophrenia Brother   . Stroke Maternal Grandfather   . Cancer Maternal Aunt   . Leukemia Paternal Aunt   . Healthy Son        x1  . Healthy Daughter        x2  . Allergies Daughter        x1  . Diabetes Neg Hx   . Alzheimer's disease Neg Hx   . Parkinson's disease Neg Hx     No Known Allergies  Current Outpatient Medications on File  Prior to Visit  Medication Sig Dispense Refill  . albuterol (PROVENTIL HFA;VENTOLIN HFA) 108 (90 Base) MCG/ACT inhaler Inhale 2 puffs into the lungs every 6 (six) hours as needed for wheezing or shortness of breath. 1 Inhaler 2  . amoxicillin (AMOXIL) 500 MG tablet Take one hour before dental procedure (Patient taking differently: Take 500 mg by mouth See admin instructions. Take one hour before dental procedure) 4 tablet 0  . benzonatate (TESSALON) 200 MG capsule Take 1 capsule (200 mg total) by mouth 3 (three) times daily as needed for cough. 60 capsule 0  . cetirizine (ZYRTEC) 10 MG tablet Take 10 mg by mouth daily as needed for allergies.    . cholecalciferol (VITAMIN D) 1000 units tablet Take 1,000 Units by mouth daily.    . famciclovir (FAMVIR) 500 MG tablet Take 1 tablet (500 mg total) by mouth daily. 90 tablet 0  . lenalidomide (REVLIMID) 10 MG capsule TAKE 1 CAPSULE BY MOUTH EVERY DAY FOR 21 DAYS, THEN 7 DAYS OFF TRZN#[id number] 21 capsule 0  . lenalidomide (REVLIMID) 10 MG capsule TAKE 1 CAPSULE BY MOUTH EVERY DAY FOR 21 DAYS, THEN 7 DAYS OFF 21 capsule 0  . lidocaine (LIDODERM) 5 % PLAEC 1 PATCH ONTO THE SKIN DAILY as needed for pain. APPLY 1 PATCH TO THE MOST PAINFUL AREA FOR 12 HR IN A 24 HR PERIOD  2  . magnesium oxide (MAG-OX) 400 MG tablet Take 400 mg by mouth daily.    . ondansetron (ZOFRAN-ODT) 8 MG disintegrating tablet Take 1 tablet (8 mg total) by mouth every 8 (eight) hours as needed. (Patient taking differently: Take 8 mg by mouth every 8 (eight) hours as needed for nausea. ) 60 tablet 1  . OVER THE COUNTER MEDICATION Take 1 tablet by mouth daily.     . polyethylene glycol powder (MIRALAX) powder Take 17 g by mouth daily. (Patient taking differently: Take 17 g by mouth daily as needed for mild constipation. ) 850 g 3  . Probiotic Product (PROBIOTIC PO) Take 1 capsule by mouth daily.    [name]  rOPINIRole (REQUIP) 0.25 MG tablet TAKE 1 TABLET BY MOUTH 1 TO 3 HOURS BEFORE BED FOR 2 DAYS  THEN INCREASE TO 2 TABS (Patient taking differently: TAKE 2 TABLETS (0.1m) BY MOUTH 1 TO 3 HOURS BEFORE BED as needed for restless legs.) 60 tablet 2  . senna (SENOKOT) 8.6 MG TABS tablet Take 2 tablets (17.2 mg total) by mouth daily. (Patient taking differently: Take 1 tablet by mouth daily as needed for mild constipation. ) 60 each 2  . sertraline (ZOLOFT) 100 MG tablet Take 100 mg by mouth at bedtime.   2  . XARELTO 10 MG TABS tablet Take 1 tablet (10 mg total) by mouth daily. (Patient taking differently: Take 10 mg by mouth every evening. ) 30 tablet 12  . Zoledronic Acid (ZOMETA IV) Inject 4 mg into the vein every 3 (three) months. Receives at Dr. [facility]Antonieta Pertoffice.    .Marland Kitchenzolpidem (AMBIEN) 10 MG tablet Take 1 tablet (10 mg total) by mouth at bedtime as needed. 30 tablet 3   No current facility-administered medications on file prior to visit.     BP 103/64   Pulse 70   Temp 98.1 F (36.7 C) (Oral)   Resp 16   Ht '5\' 7"'  (1.702 m)   Wt 178 lb (80.7 kg)   SpO2 99%   BMI 27.88 kg/m       Objective:   Physical Exam  General Mental Status- Alert. General Appearance- Not in acute distress.   Skin General: Color- Normal Color. Moisture- Normal Moisture.  Neck Carotid Arteries- Normal color. Moisture- Normal Moisture. No carotid bruits. No JVD.  Chest and Lung Exam Auscultation: Breath Sounds:-Normal.  Cardiovascular Auscultation:Rythm- Regular. Murmurs & Other Heart Sounds:Auscultation of the heart reveals- No Murmurs.  Abdomen Inspection:-Inspeection Normal. Palpation/Percussion:Note:No mass. Palpation and Percussion of the abdomen reveal- Non Tender, Non Distended + BS, no rebound or guarding.  Neurologic Cranial Nerve exam:- CN III-XII intact(No nystagmus), symmetric smile. Strength:- 5/5 equal and symmetric strength both upper and lower extremities.  Skin- tiny small moles scattered. Non that look concerning(negative screening with derm).  Rectal-  deferred. Genital- deferred.     Assessment & Plan:  For you wellness exam today I have ordered  tsh, psa lipid panel, and UA. Decided not to place  cbc and cmp order since done with Dr. EMarin Olpvery frequently.  Vaccine appear up to date. But check on tdap and can update you if not up to date.  Recommend exercise and healthy diet.  We will let you know lab results as they come in.  Follow up date appointment will be determined after lab review.   EMackie Pai PA-C

## 2018-01-18 NOTE — Patient Instructions (Addendum)
For you wellness exam today I have ordered  tsh, psa lipid panel, and UA(future). Decided not to place  cbc and cmp order since done with Dr. Marin Olp very frequently.  Vaccine appear up to date. But check on tdap and can update you if not up to date.  Recommend exercise and healthy diet.  We will let you know lab results as they come in.  Follow up date appointment will be determined after lab review.    Preventive Care 40-64 Years, Male Preventive care refers to lifestyle choices and visits with your health care provider that can promote health and wellness. What does preventive care include?  A yearly physical exam. This is also called an annual well check.  Dental exams once or twice a year.  Routine eye exams. Ask your health care provider how often you should have your eyes checked.  Personal lifestyle choices, including: ? Daily care of your teeth and gums. ? Regular physical activity. ? Eating a healthy diet. ? Avoiding tobacco and drug use. ? Limiting alcohol use. ? Practicing safe sex. ? Taking low-dose aspirin every day starting at age 77. What happens during an annual well check? The services and screenings done by your health care provider during your annual well check will depend on your age, overall health, lifestyle risk factors, and family history of disease. Counseling Your health care provider may ask you questions about your:  Alcohol use.  Tobacco use.  Drug use.  Emotional well-being.  Home and relationship well-being.  Sexual activity.  Eating habits.  Work and work Statistician.  Screening You may have the following tests or measurements:  Height, weight, and BMI.  Blood pressure.  Lipid and cholesterol levels. These may be checked every 5 years, or more frequently if you are over 80 years old.  Skin check.  Lung cancer screening. You may have this screening every year starting at age 66 if you have a 30-pack-year history of smoking  and currently smoke or have quit within the past 15 years.  Fecal occult blood test (FOBT) of the stool. You may have this test every year starting at age 52.  Flexible sigmoidoscopy or colonoscopy. You may have a sigmoidoscopy every 5 years or a colonoscopy every 10 years starting at age 28.  Prostate cancer screening. Recommendations will vary depending on your family history and other risks.  Hepatitis C blood test.  Hepatitis B blood test.  Sexually transmitted disease (STD) testing.  Diabetes screening. This is done by checking your blood sugar (glucose) after you have not eaten for a while (fasting). You may have this done every 1-3 years.  Discuss your test results, treatment options, and if necessary, the need for more tests with your health care provider. Vaccines Your health care provider may recommend certain vaccines, such as:  Influenza vaccine. This is recommended every year.  Tetanus, diphtheria, and acellular pertussis (Tdap, Td) vaccine. You may need a Td booster every 10 years.  Varicella vaccine. You may need this if you have not been vaccinated.  Zoster vaccine. You may need this after age 20.  Measles, mumps, and rubella (MMR) vaccine. You may need at least one dose of MMR if you were born in 1957 or later. You may also need a second dose.  Pneumococcal 13-valent conjugate (PCV13) vaccine. You may need this if you have certain conditions and have not been vaccinated.  Pneumococcal polysaccharide (PPSV23) vaccine. You may need one or two doses if you smoke cigarettes or  if you have certain conditions.  Meningococcal vaccine. You may need this if you have certain conditions.  Hepatitis A vaccine. You may need this if you have certain conditions or if you travel or work in places where you may be exposed to hepatitis A.  Hepatitis B vaccine. You may need this if you have certain conditions or if you travel or work in places where you may be exposed to hepatitis  B.  Haemophilus influenzae type b (Hib) vaccine. You may need this if you have certain risk factors.  Talk to your health care provider about which screenings and vaccines you need and how often you need them. This information is not intended to replace advice given to you by your health care provider. Make sure you discuss any questions you have with your health care provider. Document Released: 07/27/2015 Document Revised: 03/19/2016 Document Reviewed: 05/01/2015 Elsevier Interactive Patient Education  [employer].

## 2018-01-19 ENCOUNTER — Ambulatory Visit: Payer: 59 | Admitting: Physical Therapy

## 2018-01-19 ENCOUNTER — Encounter: Payer: Self-pay | Admitting: Physical Therapy

## 2018-01-19 DIAGNOSIS — R293 Abnormal posture: Secondary | ICD-10-CM

## 2018-01-19 DIAGNOSIS — M25511 Pain in right shoulder: Secondary | ICD-10-CM | POA: Diagnosis not present

## 2018-01-19 DIAGNOSIS — M6281 Muscle weakness (generalized): Secondary | ICD-10-CM

## 2018-01-19 DIAGNOSIS — M25611 Stiffness of right shoulder, not elsewhere classified: Secondary | ICD-10-CM

## 2018-01-19 NOTE — Therapy (Signed)
[facility] [address]  [place], [place], [address] Phone: [phone]   Fax:  [phone]  Physical Therapy Treatment  Patient Details  Name: Laurin Morgenstern MRN: [id number] Date of Birth: Sep 11, 1955 Referring Provider: Elsie Saas, MD   Encounter Date: 01/19/2018  PT End of Session - 01/19/18 1226    Visit Number  8    Number of Visits  12    Date for PT Re-Evaluation  01/27/18    Authorization Type  [employer]    Authorization - Number of Visits  60    PT Start Time  1104    PT Stop Time  1151    PT Time Calculation (min)  47 min    Activity Tolerance  Patient tolerated treatment well    Behavior During Therapy  [facility] for tasks assessed/performed       Past Medical History:  Diagnosis Date  . Anxiety   . Bone metastasis ([place])   . Chest cold 05/19/2016   productive cough  -- started on antibiotic  . Chronic back pain    due to bone mets from myeloma  . Cough   . Depression   . GERD (gastroesophageal reflux disease)   . Hiatal hernia   . History of chicken pox   . History of concussion    age 5 -- no residual  . History of DVT of lower extremity 03/21/2016  treated and completed w/ xarelto   per doppler left extensive occlusion common femoral, femoral, and popliteal veins and right partial occlusion common femoral and profunda femoral veins/  last doppler 06-04-2016 no evidence acute or chronic dvt noted either leg   . History of radiation therapy 06/09/16-06/23/16   lower thoracic spine 25 Gy in 10 fractions  . Mouth ulcers    secondary to radiation  . Multiple myeloma ([place]) dx 02/22/2016 via bone marrow bx---  oncologist-  [name]   IgG Kappa-- Hyperdiploid/ +11 w/ bone mets--  current treatment chemotherapy (started 08/ 2017)and pallitive radiation to back started 06-09-2016  . Renal calculus, right   . Wears contact lenses     Past Surgical History:  Procedure Laterality Date  . COLONOSCOPY   [id number]  . CYSTOSCOPY W/ URETERAL STENT PLACEMENT Right 06/20/2016   Procedure: CYSTOSCOPY WITH STENT REPLACEMENT;  Surgeon: Kathie Rhodes, MD;  Location: [facility];  Service: Urology;  Laterality: Right;  . CYSTOSCOPY WITH RETROGRADE PYELOGRAM, URETEROSCOPY AND STENT PLACEMENT Right 05/30/2016   Procedure: CYSTOSCOPY WITH RETROGRADE PYELOGRAM, URETEROSCOPY AND STENT PLACEMENT,DILITATION URETERAL STRICTURE;  Surgeon: Kathie Rhodes, MD;  Location: [facility];  Service: Urology;  Laterality: Right;  . CYSTOSCOPY/RETROGRADE/URETEROSCOPY/STONE EXTRACTION WITH BASKET Right 06/20/2016   Procedure: CYSTOSCOPY/URETEROSCOPY/STONE EXTRACTION WITH BASKET;  Surgeon: Kathie Rhodes, MD;  Location: [facility];  Service: Urology;  Laterality: Right;  . HOLMIUM LASER APPLICATION Right 75/02/8324   Procedure: HOLMIUM LASER APPLICATION;  Surgeon: Kathie Rhodes, MD;  Location: [facility];  Service: Urology;  Laterality: Right;  . IR GENERIC HISTORICAL  02/11/2016   IR RADIOLOGIST EVAL & MGMT 02/11/2016 [facility]  . IR GENERIC HISTORICAL  02/15/2016   IR BONE TUMOR(S)RF ABLATION 02/15/2016 Luanne Bras, MD [facility]  . IR GENERIC HISTORICAL  02/15/2016   IR BONE TUMOR(S)RF ABLATION 02/15/2016 Luanne Bras, MD [facility]  . IR GENERIC HISTORICAL  02/15/2016   IR BONE TUMOR(S)RF ABLATION 02/15/2016 Luanne Bras, MD [facility]  . IR GENERIC HISTORICAL  02/15/2016  IR KYPHO THORACIC WITH BONE BIOPSY 02/15/2016 Luanne Bras, MD [facility]  . IR GENERIC HISTORICAL  02/15/2016   IR KYPHO THORACIC WITH BONE BIOPSY 02/15/2016 Luanne Bras, MD [facility]  . IR GENERIC HISTORICAL  02/15/2016   IR VERTEBROPLASTY CERV/THOR BX INC UNI/BIL INC/INJECT/IMAGING 02/15/2016 Luanne Bras, MD [facility]  . IR GENERIC HISTORICAL  03/13/2016   IR KYPHO EA ADDL LEVEL THORACIC OR LUMBAR 03/13/2016 Luanne Bras, MD [facility]  . IR GENERIC HISTORICAL  03/13/2016    IR KYPHO EA ADDL LEVEL THORACIC OR LUMBAR 03/13/2016 Luanne Bras, MD [facility]  . IR GENERIC HISTORICAL  03/13/2016   IR BONE TUMOR(S)RF ABLATION 03/13/2016 Luanne Bras, MD [facility]  . IR GENERIC HISTORICAL  03/13/2016   IR KYPHO LUMBAR INC FX REDUCE BONE BX UNI/BIL CANNULATION INC/IMAGING 03/13/2016 Luanne Bras, MD [facility]  . IR GENERIC HISTORICAL  03/13/2016   IR BONE TUMOR(S)RF ABLATION 03/13/2016 Luanne Bras, MD [facility]  . IR GENERIC HISTORICAL  03/13/2016   IR BONE TUMOR(S)RF ABLATION 03/13/2016 Luanne Bras, MD [facility]  . IR GENERIC HISTORICAL  03/31/2016   IR RADIOLOGIST EVAL & MGMT 03/31/2016 [facility]  . LAPAROSCOPIC INGUINAL HERNIA REPAIR Bilateral 12-16-2013  dr gross  . RADIOLOGY WITH ANESTHESIA N/A 02/15/2016   Procedure: Spinal Ablation;  Surgeon: Luanne Bras, MD;  Location: [facility];  Service: Radiology;  Laterality: N/A;  . RADIOLOGY WITH ANESTHESIA N/A 03/13/2016   Procedure: LUMBER ABLATION;  Surgeon: Luanne Bras, MD;  Location: [facility];  Service: Radiology;  Laterality: N/A;  . ROTATOR CUFF REPAIR Right 2003  . TONSILLECTOMY  age 62  . WISDOM TOOTH EXTRACTION      There were no vitals filed for this visit.  Subjective Assessment - 01/19/18 1107    Subjective  Pt reports that he had a flare-up of pain over the weekend in the front of the shoulder. Is doing a lot of work around the house including moving furniture to downsize and all the lifting has irritated it.     Pertinent History  multiple myeloma s/p stem cell transplant 08/22/2016 at [facility] - currently controlled but not in remission - follows up regularly with Dr. Marin Olp; multiple vertebral compression fractures with kyphoplasties and vertbroloplasties    Limitations  House hold activities;Lifting    Diagnostic tests  X-ray completed in MD office revealed some OA in R shoulder    Patient Stated Goals  "to have more movement in my arm, get rid of most of the pain,  and start strengthening my shoulder"    Currently in Pain?  Yes    Pain Score  3     Pain Location  Shoulder    Pain Orientation  Right    Pain Descriptors / Indicators  Throbbing    Pain Radiating Towards  Front of biceps and under inferior aspect of shoulder    Pain Onset  1 to 4 weeks ago    Aggravating Factors   Lifting/moving furniture and boxes around the house                       [facility] Adult PT Treatment/Exercise - 01/19/18 1112      Shoulder Exercises: Pulleys   Flexion  3 minutes    ABduction  3 minutes      Modalities   Modalities  Iontophoresis      Iontophoresis   Type of Iontophoresis  Dexamethasone    Location  R anterior shoulder (  over coracoid process)    Dose  80 mA-min, 1.0 mL    Time  4-6 hr patch (#3 of 6)      Manual Therapy   Manual Therapy  Myofascial release;Soft tissue mobilization;Passive ROM;Joint mobilization    Manual therapy comments  Supine, Pt R shoulder supported into ER    Joint Mobilization  R shoulder Inferior and A/P glenohumeral mobs - grade 3    Soft tissue mobilization  R biceps, R anterior delt, R Lat    Myofascial Release  manual TPR to R lateral pec major, R short head of biceps, R teres group &  lateral subscapularis    Passive ROM  R ER/IR       Trigger Point Dry Needling - 01/19/18 1104    Consent Given?  Yes    Muscles Treated Upper Body  Pectoralis major R biceps & teres group    Pectoralis Major Response  Twitch response elicited;Palpable increased muscle length             PT Short Term Goals - 12/24/17 1147      PT SHORT TERM GOAL #1   Title  independent with initial HEP    Status  Achieved        PT Long Term Goals - 12/22/17 1451      PT LONG TERM GOAL #1   Title  Independent with ongoing HEP +/- gym program     Status  On-going      PT LONG TERM GOAL #2   Title  R shoulder AROM equivalent to L shoulder w/o pain provocation     Status  On-going      PT LONG TERM GOAL #3   Title   R shoulder strength >/= 4+/5 w/o pain on resistance    Status  On-going      PT LONG TERM GOAL #4   Title  Patient to report ability to perform daily activities including ADLs, reaching and light lifting with R arm w/o pain provocation    Status  On-going      PT LONG TERM GOAL #5   Title  Patient to return to working out w/o limitation due to R shoulder pain    Status  On-going            Plan - 01/19/18 1227    Clinical Impression Statement  Pt responded well to manual therapy and TP DN today with less restriction noted after session. Pt continues to have pain with activity and will benefit from further strengthening/stabilization of R glenohumeral joint and manual therapy over R pec major and biceps. Pt will continue to benefit from therapy to continue to progress toward his functional goals and decrease pain.     Rehab Potential  Good    Clinical Impairments Affecting Rehab Potential  multiple myeloma s/p stem cell transplant 08/22/2016 at [facility] - currently controlled but not in remission; multiple vertebral compression fractures with kyphoplasties and vertbroloplasties    PT Treatment/Interventions  Patient/family education;ADLs/Self Care Home Management;Therapeutic exercise;Therapeutic activities;Neuromuscular re-education;Manual techniques;Passive range of motion;Dry needling;Taping;Electrical Stimulation;Moist Heat;Cryotherapy;Vasopneumatic Device;Iontophoresis 25m/ml Dexamethasone    Consulted and Agree with Plan of Care  Patient       Patient will benefit from skilled therapeutic intervention in order to improve the following deficits and impairments:  Pain, Postural dysfunction, Decreased range of motion, Decreased strength, Increased muscle spasms, Impaired flexibility, Impaired UE functional use, Decreased activity tolerance, Abnormal gait  Visit Diagnosis: Acute pain of right shoulder  Stiffness  of right shoulder, not elsewhere classified  Abnormal posture  Muscle  weakness (generalized)     Problem List Patient Active Problem List   Diagnosis Date Noted  . Bacteremia 11/10/2017  . Sepsis ([place]) 11/09/2017  . HCAP (healthcare-associated pneumonia) 11/09/2017  . Thrombocytopenia ([place]) 11/09/2017  . Iron deficiency anemia due to chronic blood loss   . Neutropenia ([place]) 03/02/2017  . Cellulitis 03/01/2017  . Phlebitis or thrombophlebitis of lower extremity 07/21/2016  . Visit for preventive health examination 05/16/2016  . Aphthous ulcer 05/16/2016  . Ureterolithiasis 05/16/2016  . Acute deep vein thrombosis (DVT) of femoral vein of left lower extremity ([place]) 03/21/2016  . Multiple myeloma ([place]) 02/22/2016  . Multiple myeloma not having achieved remission ([place]) 02/22/2016  . Bilateral inguinal hernia (BIH), s/p lap repair 12/16/2013 11/02/2013    Shirline Frees, SPT 01/19/2018, 6:14 PM  [facility] [address]  [place], [place], [address] Phone: [phone]   Fax:  [phone]  Name: Nishawn Rotan MRN: [id number] Date of Birth: [birth year]

## 2018-01-21 ENCOUNTER — Other Ambulatory Visit (INDEPENDENT_AMBULATORY_CARE_PROVIDER_SITE_OTHER): Payer: 59

## 2018-01-21 DIAGNOSIS — Z0000 Encounter for general adult medical examination without abnormal findings: Secondary | ICD-10-CM

## 2018-01-21 DIAGNOSIS — R5383 Other fatigue: Secondary | ICD-10-CM

## 2018-01-21 DIAGNOSIS — Z125 Encounter for screening for malignant neoplasm of prostate: Secondary | ICD-10-CM

## 2018-01-21 LAB — URINALYSIS, ROUTINE W REFLEX MICROSCOPIC
Bilirubin Urine: NEGATIVE
Hgb urine dipstick: NEGATIVE
KETONES UR: NEGATIVE
Leukocytes, UA: NEGATIVE
NITRITE: NEGATIVE
RBC / HPF: NONE SEEN (ref 0–?)
SPECIFIC GRAVITY, URINE: 1.015 (ref 1.000–1.030)
Total Protein, Urine: NEGATIVE
URINE GLUCOSE: NEGATIVE
UROBILINOGEN UA: 0.2 (ref 0.0–1.0)
pH: 6.5 (ref 5.0–8.0)

## 2018-01-21 LAB — LIPID PANEL
Cholesterol: 183 mg/dL (ref 0–200)
HDL: 63.6 mg/dL (ref >39.00)
LDL Cholesterol: 103 mg/dL — ABNORMAL HIGH (ref 0–99)
NonHDL: 119.74
Total CHOL/HDL Ratio: 3
Triglycerides: 83 mg/dL (ref 0.0–149.0)
VLDL: 16.6 mg/dL (ref 0.0–40.0)

## 2018-01-21 LAB — TSH: TSH: 1.95 u[IU]/mL (ref 0.35–4.50)

## 2018-01-21 LAB — PSA: PSA: 0.5 ng/mL (ref 0.10–4.00)

## 2018-01-31 ENCOUNTER — Other Ambulatory Visit: Payer: Self-pay

## 2018-01-31 ENCOUNTER — Encounter (HOSPITAL_BASED_OUTPATIENT_CLINIC_OR_DEPARTMENT_OTHER): Payer: Self-pay | Admitting: *Deleted

## 2018-01-31 ENCOUNTER — Inpatient Hospital Stay (HOSPITAL_BASED_OUTPATIENT_CLINIC_OR_DEPARTMENT_OTHER)
Admission: EM | Admit: 2018-01-31 | Discharge: 2018-02-04 | DRG: 194 | Disposition: A | Discharge status: Home / Self Care | Payer: 59 | Attending: Internal Medicine | Admitting: Internal Medicine

## 2018-01-31 ENCOUNTER — Emergency Department (HOSPITAL_BASED_OUTPATIENT_CLINIC_OR_DEPARTMENT_OTHER): Payer: 59

## 2018-01-31 DIAGNOSIS — Z818 Family history of other mental and behavioral disorders: Secondary | ICD-10-CM

## 2018-01-31 DIAGNOSIS — Y95 Nosocomial condition: Secondary | ICD-10-CM | POA: Diagnosis present

## 2018-01-31 DIAGNOSIS — D709 Neutropenia, unspecified: Secondary | ICD-10-CM | POA: Diagnosis present

## 2018-01-31 DIAGNOSIS — F329 Major depressive disorder, single episode, unspecified: Secondary | ICD-10-CM | POA: Diagnosis present

## 2018-01-31 DIAGNOSIS — C9000 Multiple myeloma not having achieved remission: Secondary | ICD-10-CM | POA: Diagnosis not present

## 2018-01-31 DIAGNOSIS — Z9481 Bone marrow transplant status: Secondary | ICD-10-CM

## 2018-01-31 DIAGNOSIS — G893 Neoplasm related pain (acute) (chronic): Secondary | ICD-10-CM | POA: Diagnosis not present

## 2018-01-31 DIAGNOSIS — Z9484 Stem cells transplant status: Secondary | ICD-10-CM | POA: Diagnosis not present

## 2018-01-31 DIAGNOSIS — Z7901 Long term (current) use of anticoagulants: Secondary | ICD-10-CM

## 2018-01-31 DIAGNOSIS — Z8701 Personal history of pneumonia (recurrent): Secondary | ICD-10-CM | POA: Diagnosis not present

## 2018-01-31 DIAGNOSIS — Z86718 Personal history of other venous thrombosis and embolism: Secondary | ICD-10-CM

## 2018-01-31 DIAGNOSIS — Z8619 Personal history of other infectious and parasitic diseases: Secondary | ICD-10-CM

## 2018-01-31 DIAGNOSIS — J181 Lobar pneumonia, unspecified organism: Secondary | ICD-10-CM | POA: Diagnosis not present

## 2018-01-31 DIAGNOSIS — Z87891 Personal history of nicotine dependence: Secondary | ICD-10-CM

## 2018-01-31 DIAGNOSIS — F419 Anxiety disorder, unspecified: Secondary | ICD-10-CM | POA: Diagnosis present

## 2018-01-31 DIAGNOSIS — J189 Pneumonia, unspecified organism: Secondary | ICD-10-CM | POA: Diagnosis present

## 2018-01-31 DIAGNOSIS — R5081 Fever presenting with conditions classified elsewhere: Secondary | ICD-10-CM | POA: Diagnosis not present

## 2018-01-31 DIAGNOSIS — Z87820 Personal history of traumatic brain injury: Secondary | ICD-10-CM | POA: Diagnosis not present

## 2018-01-31 DIAGNOSIS — D61818 Other pancytopenia: Secondary | ICD-10-CM | POA: Diagnosis present

## 2018-01-31 DIAGNOSIS — D500 Iron deficiency anemia secondary to blood loss (chronic): Secondary | ICD-10-CM | POA: Diagnosis present

## 2018-01-31 DIAGNOSIS — B9781 Human metapneumovirus as the cause of diseases classified elsewhere: Secondary | ICD-10-CM | POA: Diagnosis present

## 2018-01-31 DIAGNOSIS — I340 Nonrheumatic mitral (valve) insufficiency: Secondary | ICD-10-CM | POA: Diagnosis present

## 2018-01-31 DIAGNOSIS — Z79899 Other long term (current) drug therapy: Secondary | ICD-10-CM

## 2018-01-31 DIAGNOSIS — Z87442 Personal history of urinary calculi: Secondary | ICD-10-CM

## 2018-01-31 DIAGNOSIS — J90 Pleural effusion, not elsewhere classified: Secondary | ICD-10-CM | POA: Diagnosis present

## 2018-01-31 DIAGNOSIS — K219 Gastro-esophageal reflux disease without esophagitis: Secondary | ICD-10-CM | POA: Diagnosis not present

## 2018-01-31 DIAGNOSIS — G8929 Other chronic pain: Secondary | ICD-10-CM | POA: Diagnosis present

## 2018-01-31 DIAGNOSIS — K449 Diaphragmatic hernia without obstruction or gangrene: Secondary | ICD-10-CM | POA: Diagnosis not present

## 2018-01-31 DIAGNOSIS — I82412 Acute embolism and thrombosis of left femoral vein: Secondary | ICD-10-CM | POA: Diagnosis not present

## 2018-01-31 DIAGNOSIS — J9811 Atelectasis: Secondary | ICD-10-CM | POA: Diagnosis not present

## 2018-01-31 DIAGNOSIS — R0602 Shortness of breath: Secondary | ICD-10-CM

## 2018-01-31 DIAGNOSIS — R05 Cough: Secondary | ICD-10-CM | POA: Diagnosis not present

## 2018-01-31 DIAGNOSIS — B348 Other viral infections of unspecified site: Secondary | ICD-10-CM | POA: Diagnosis present

## 2018-01-31 DIAGNOSIS — Z923 Personal history of irradiation: Secondary | ICD-10-CM | POA: Diagnosis not present

## 2018-01-31 LAB — CBC WITH DIFFERENTIAL/PLATELET
BASOS ABS: 0 10*3/uL (ref 0.0–0.1)
Basophils Relative: 1 %
Eosinophils Absolute: 0 10*3/uL (ref 0.0–0.7)
Eosinophils Relative: 1 %
HEMATOCRIT: 26.6 % — AB (ref 39.0–52.0)
Hemoglobin: 8 g/dL — ABNORMAL LOW (ref 13.0–17.0)
Lymphocytes Relative: 26 %
Lymphs Abs: 0.6 10*3/uL — ABNORMAL LOW (ref 0.7–4.0)
MCH: 26.8 pg (ref 26.0–34.0)
MCHC: 30.1 g/dL (ref 30.0–36.0)
MCV: 89.3 fL (ref 78.0–100.0)
Monocytes Absolute: 0.4 10*3/uL (ref 0.1–1.0)
Monocytes Relative: 17 %
NEUTROS ABS: 1.2 10*3/uL — AB (ref 1.7–7.7)
Neutrophils Relative %: 55 %
Platelets: 124 10*3/uL — ABNORMAL LOW (ref 150–400)
RBC: 2.98 MIL/uL — AB (ref 4.22–5.81)
RDW: 19 % — ABNORMAL HIGH (ref 11.5–15.5)
WBC: 2.2 10*3/uL — ABNORMAL LOW (ref 4.0–10.5)

## 2018-01-31 LAB — BASIC METABOLIC PANEL
ANION GAP: 7 (ref 5–15)
BUN: 9 mg/dL (ref 8–23)
CO2: 23 mmol/L (ref 22–32)
Calcium: 8.1 mg/dL — ABNORMAL LOW (ref 8.9–10.3)
Chloride: 110 mmol/L (ref 98–111)
Creatinine, Ser: 0.9 mg/dL (ref 0.61–1.24)
GFR calc Af Amer: >60 mL/min (ref >60)
GFR calc non Af Amer: >60 mL/min (ref >60)
GLUCOSE: 78 mg/dL (ref 70–99)
Potassium: 3.7 mmol/L (ref 3.5–5.1)
Sodium: 140 mmol/L (ref 135–145)

## 2018-01-31 LAB — I-STAT CG4 LACTIC ACID, ED: Lactic Acid, Venous: 1.03 mmol/L (ref 0.5–1.9)

## 2018-01-31 LAB — TROPONIN I: Troponin I: <0.03 ng/mL (ref <0.03)

## 2018-01-31 LAB — D-DIMER, QUANTITATIVE: D-Dimer, Quant: 0.45 ug/mL-FEU (ref 0.00–0.50)

## 2018-01-31 MED ORDER — ALBUTEROL SULFATE (2.5 MG/3ML) 0.083% IN NEBU
5.0000 mg | INHALATION_SOLUTION | Freq: Once | RESPIRATORY_TRACT | Status: AC
  Administered 2018-01-31: 5 mg via RESPIRATORY_TRACT
  Filled 2018-01-31: qty 6

## 2018-01-31 MED ORDER — CEFEPIME HCL 1 G IJ SOLR
INTRAMUSCULAR | Status: AC
  Administered 2018-01-31: 1 g
  Filled 2018-01-31: qty 1

## 2018-01-31 MED ORDER — SODIUM CHLORIDE 0.9 % IV SOLN
1.0000 g | Freq: Once | INTRAVENOUS | Status: AC
  Administered 2018-01-31: 1 g via INTRAVENOUS

## 2018-01-31 MED ORDER — VANCOMYCIN HCL IN DEXTROSE 1-5 GM/200ML-% IV SOLN
1000.0000 mg | Freq: Once | INTRAVENOUS | Status: AC
  Administered 2018-01-31: 1000 mg via INTRAVENOUS
  Filled 2018-01-31: qty 200

## 2018-01-31 NOTE — ED Triage Notes (Signed)
Pt reports recent travel to [place]. Low grade fever. C/o SOB with exertion

## 2018-01-31 NOTE — ED Notes (Signed)
ED Provider at bedside. 

## 2018-01-31 NOTE — ED Provider Notes (Signed)
[place] EMERGENCY DEPARTMENT Provider Note   CSN: [id number] Arrival date & time: 01/31/18  2102     History   Chief Complaint Chief Complaint  Patient presents with  . Shortness of Breath    HPI Jorge [name] is a 62 y.o. male.  Patient is a 62 year old male with a history of multiple myeloma who presents with shortness of breath.  He states he has had a cough for about the last week.  Initially was productive but now it is more of a dry cough.  He has had some progressive shortness of breath over the week.  He started running a fever tonight to 100.8.  He feels a little bit fatigued and achy all over.  He denies any chest pain.  He had a recent admission for pneumonia in April of this year.  He was discharged with an inhaler following that but otherwise does not use inhalers.  He is a non-smoker.  He does have a history of DVTs and is on Xarelto.  He has not missed any doses of his Xarelto rather than today he has not taken it.  He just got back from a plane trip from [place] today.  He has noted a little bit of swelling in his legs which he states is not atypical for him when he travels by plane.     Past Medical History:  Diagnosis Date  . Anxiety   . Bone metastasis ([place])   . Chest cold 05/19/2016   productive cough  -- started on antibiotic  . Chronic back pain    due to bone mets from myeloma  . Cough   . Depression   . GERD (gastroesophageal reflux disease)   . Hiatal hernia   . History of chicken pox   . History of concussion    age 71 -- no residual  . History of DVT of lower extremity 03/21/2016  treated and completed w/ xarelto   per doppler left extensive occlusion common femoral, femoral, and popliteal veins and right partial occlusion common femoral and profunda femoral veins/  last doppler 06-04-2016 no evidence acute or chronic dvt noted either leg   . History of radiation therapy 06/09/16-06/23/16   lower thoracic spine 25 Gy in 10 fractions  .  Mouth ulcers    secondary to radiation  . Multiple myeloma ([place]) dx 02/22/2016 via bone marrow bx---  oncologist-  [name]   IgG Kappa-- Hyperdiploid/ +11 w/ bone mets--  current treatment chemotherapy (started 08/ 2017)and pallitive radiation to back started 06-09-2016  . Renal calculus, right   . Wears contact lenses     Patient Active Problem List   Diagnosis Date Noted  . Bacteremia 11/10/2017  . Sepsis ([place]) 11/09/2017  . HCAP (healthcare-associated pneumonia) 11/09/2017  . Thrombocytopenia ([place]) 11/09/2017  . Iron deficiency anemia due to chronic blood loss   . Neutropenia ([place]) 03/02/2017  . Cellulitis 03/01/2017  . Phlebitis or thrombophlebitis of lower extremity 07/21/2016  . Visit for preventive health examination 05/16/2016  . Aphthous ulcer 05/16/2016  . Ureterolithiasis 05/16/2016  . Acute deep vein thrombosis (DVT) of femoral vein of left lower extremity ([place]) 03/21/2016  . Multiple myeloma ([place]) 02/22/2016  . Multiple myeloma not having achieved remission ([place]) 02/22/2016  . Bilateral inguinal hernia (BIH), s/p lap repair 12/16/2013 11/02/2013    Past Surgical History:  Procedure Laterality Date  . COLONOSCOPY  [id number]  . CYSTOSCOPY W/ URETERAL STENT PLACEMENT Right 06/20/2016   Procedure: CYSTOSCOPY WITH STENT  REPLACEMENT;  Surgeon: Kathie Rhodes, MD;  Location: [facility];  Service: Urology;  Laterality: Right;  . CYSTOSCOPY WITH RETROGRADE PYELOGRAM, URETEROSCOPY AND STENT PLACEMENT Right 05/30/2016   Procedure: CYSTOSCOPY WITH RETROGRADE PYELOGRAM, URETEROSCOPY AND STENT PLACEMENT,DILITATION URETERAL STRICTURE;  Surgeon: Kathie Rhodes, MD;  Location: [facility];  Service: Urology;  Laterality: Right;  . CYSTOSCOPY/RETROGRADE/URETEROSCOPY/STONE EXTRACTION WITH BASKET Right 06/20/2016   Procedure: CYSTOSCOPY/URETEROSCOPY/STONE EXTRACTION WITH BASKET;  Surgeon: Kathie Rhodes, MD;  Location: [facility];  Service: Urology;  Laterality: Right;    . HOLMIUM LASER APPLICATION Right 08/18/8525   Procedure: HOLMIUM LASER APPLICATION;  Surgeon: Kathie Rhodes, MD;  Location: [facility];  Service: Urology;  Laterality: Right;  . IR GENERIC HISTORICAL  02/11/2016   IR RADIOLOGIST EVAL & MGMT 02/11/2016 [facility]  . IR GENERIC HISTORICAL  02/15/2016   IR BONE TUMOR(S)RF ABLATION 02/15/2016 Luanne Bras, MD [facility]  . IR GENERIC HISTORICAL  02/15/2016   IR BONE TUMOR(S)RF ABLATION 02/15/2016 Luanne Bras, MD [facility]  . IR GENERIC HISTORICAL  02/15/2016   IR BONE TUMOR(S)RF ABLATION 02/15/2016 Luanne Bras, MD [facility]  . IR GENERIC HISTORICAL  02/15/2016   IR KYPHO THORACIC WITH BONE BIOPSY 02/15/2016 Luanne Bras, MD [facility]  . IR GENERIC HISTORICAL  02/15/2016   IR KYPHO THORACIC WITH BONE BIOPSY 02/15/2016 Luanne Bras, MD [facility]  . IR GENERIC HISTORICAL  02/15/2016   IR VERTEBROPLASTY CERV/THOR BX INC UNI/BIL INC/INJECT/IMAGING 02/15/2016 Luanne Bras, MD [facility]  . IR GENERIC HISTORICAL  03/13/2016   IR KYPHO EA ADDL LEVEL THORACIC OR LUMBAR 03/13/2016 Luanne Bras, MD [facility]  . IR GENERIC HISTORICAL  03/13/2016   IR KYPHO EA ADDL LEVEL THORACIC OR LUMBAR 03/13/2016 Luanne Bras, MD [facility]  . IR GENERIC HISTORICAL  03/13/2016   IR BONE TUMOR(S)RF ABLATION 03/13/2016 Luanne Bras, MD [facility]  . IR GENERIC HISTORICAL  03/13/2016   IR KYPHO LUMBAR INC FX REDUCE BONE BX UNI/BIL CANNULATION INC/IMAGING 03/13/2016 Luanne Bras, MD [facility]  . IR GENERIC HISTORICAL  03/13/2016   IR BONE TUMOR(S)RF ABLATION 03/13/2016 Luanne Bras, MD [facility]  . IR GENERIC HISTORICAL  03/13/2016   IR BONE TUMOR(S)RF ABLATION 03/13/2016 Luanne Bras, MD [facility]  . IR GENERIC HISTORICAL  03/31/2016   IR RADIOLOGIST EVAL & MGMT 03/31/2016 [facility]  . LAPAROSCOPIC INGUINAL HERNIA REPAIR Bilateral 12-16-2013  dr gross  . RADIOLOGY WITH ANESTHESIA  N/A 02/15/2016   Procedure: Spinal Ablation;  Surgeon: Luanne Bras, MD;  Location: [facility];  Service: Radiology;  Laterality: N/A;  . RADIOLOGY WITH ANESTHESIA N/A 03/13/2016   Procedure: LUMBER ABLATION;  Surgeon: Luanne Bras, MD;  Location: [facility];  Service: Radiology;  Laterality: N/A;  . ROTATOR CUFF REPAIR Right 2003  . TONSILLECTOMY  age 65  . WISDOM TOOTH EXTRACTION          Home Medications    Prior to Admission medications   Medication Sig Start Date End Date Taking? Authorizing Provider  albuterol (PROVENTIL HFA;VENTOLIN HFA) 108 (90 Base) MCG/ACT inhaler Inhale 2 puffs into the lungs every 6 (six) hours as needed for wheezing or shortness of breath. 11/13/17  Yes Jani Gravel, MD  cetirizine (ZYRTEC) 10 MG tablet Take 10 mg by mouth daily as needed for allergies.   Yes [provider]  cholecalciferol (VITAMIN D) 1000 units tablet Take 1,000 Units by mouth daily.   Yes [provider]  famciclovir (FAMVIR) 500 MG tablet  Take 1 tablet (500 mg total) by mouth daily. 11/02/17  Yes [name], MD  lenalidomide (REVLIMID) 10 MG capsule TAKE 1 CAPSULE BY MOUTH EVERY DAY FOR 21 DAYS, THEN 7 DAYS OFF NOMV#[id number] 01/07/18  Yes Ennever, Rudell Cobb, MD  lenalidomide (REVLIMID) 10 MG capsule TAKE 1 CAPSULE BY MOUTH EVERY DAY FOR 21 DAYS, THEN 7 DAYS OFF 01/16/18  Yes Ennever, Rudell Cobb, MD  magnesium oxide (MAG-OX) 400 MG tablet Take 400 mg by mouth daily.   Yes [provider]  OVER THE COUNTER MEDICATION Take 1 tablet by mouth daily.    Yes [provider]  Probiotic Product (PROBIOTIC PO) Take 1 capsule by mouth daily.   Yes [provider]  sertraline (ZOLOFT) 100 MG tablet Take 100 mg by mouth at bedtime.  01/02/16  Yes [provider]  XARELTO 10 MG TABS tablet Take 1 tablet (10 mg total) by mouth daily. Patient taking differently: Take 10 mg by mouth every evening.  11/02/17  Yes Ennever, Rudell Cobb, MD  Zoledronic Acid (ZOMETA IV)  Inject 4 mg into the vein every 3 (three) months. Receives at [name] office.   Yes [provider]  zolpidem (AMBIEN) 10 MG tablet Take 1 tablet (10 mg total) by mouth at bedtime as needed. 01/06/18  Yes Ennever, Rudell Cobb, MD  amoxicillin (AMOXIL) 500 MG tablet Take one hour before dental procedure Patient taking differently: Take 500 mg by mouth See admin instructions. Take one hour before dental procedure 09/28/17   [name], MD  benzonatate (TESSALON) 200 MG capsule Take 1 capsule (200 mg total) by mouth 3 (three) times daily as needed for cough. 09/09/17   [name], MD  lidocaine (LIDODERM) 5 % PLAEC 1 PATCH ONTO THE SKIN DAILY as needed for pain. APPLY 1 PATCH TO THE MOST PAINFUL AREA FOR 12 HR IN A 24 HR PERIOD 12/19/16   [provider]  ondansetron (ZOFRAN-ODT) 8 MG disintegrating tablet Take 1 tablet (8 mg total) by mouth every 8 (eight) hours as needed. Patient taking differently: Take 8 mg by mouth every 8 (eight) hours as needed for nausea.  09/12/16   [name], MD  polyethylene glycol powder (MIRALAX) powder Take 17 g by mouth daily. Patient taking differently: Take 17 g by mouth daily as needed for mild constipation.  03/26/16   [name], MD  rOPINIRole (REQUIP) 0.25 MG tablet TAKE 1 TABLET BY MOUTH 1 TO 3 HOURS BEFORE BED FOR 2 DAYS THEN INCREASE TO 2 TABS Patient taking differently: TAKE 2 TABLETS (0.75m) BY MOUTH 1 TO 3 HOURS BEFORE BED as needed for restless legs. 09/07/17   [place], SHolli Humbles NP  senna (SENOKOT) 8.6 MG TABS tablet Take 2 tablets (17.2 mg total) by mouth daily. Patient taking differently: Take 1 tablet by mouth daily as needed for mild constipation.  05/07/16   [place], SHolli Humbles NP    Family History Family History  Problem Relation Age of Onset  . Uterine cancer Mother   . Heart disease Father   . Hypertension Father   . Multiple sclerosis Sister   . Paranoid behavior Brother   . Drug abuse Brother   .  Schizophrenia Brother   . Stroke Maternal Grandfather   . Cancer Maternal Aunt   . Leukemia Paternal Aunt   . Healthy Son        x1  . Healthy Daughter        x2  .  Allergies Daughter        x1  . Diabetes Neg Hx   . Alzheimer's disease Neg Hx   . Parkinson's disease Neg Hx     Social History Social History   Tobacco Use  . Smoking status: Former Smoker    Packs/day: 1.00    Years: 7.00    Pack years: 7.00    Types: Cigarettes    Last attempt to quit: 11/02/1980    Years since quitting: 37.2  . Smokeless tobacco: Never Used  Substance Use Topics  . Alcohol use: Yes    Comment: occasional  . Drug use: No     Allergies   Patient has no known allergies.   Review of Systems Review of Systems  Constitutional: Positive for fatigue and fever. Negative for chills and diaphoresis.  HENT: Negative for congestion, rhinorrhea and sneezing.   Eyes: Negative.   Respiratory: Positive for cough and shortness of breath. Negative for chest tightness.   Cardiovascular: Positive for leg swelling. Negative for chest pain.  Gastrointestinal: Negative for abdominal pain, blood in stool, diarrhea, nausea and vomiting.  Genitourinary: Negative for difficulty urinating, flank pain, frequency and hematuria.  Musculoskeletal: Negative for arthralgias and back pain.  Skin: Negative for rash.  Neurological: Negative for dizziness, speech difficulty, weakness, numbness and headaches.     Physical Exam Updated Vital Signs BP 119/73   Pulse (!) 101   Temp 99.1 F (37.3 C) (Oral)   Resp 10   Ht '5\' 7"'  (1.702 m)   Wt 79.4 kg (175 lb)   SpO2 100%   BMI 27.41 kg/m   Physical Exam  Constitutional: He is oriented to person, place, and time. He appears well-developed and well-nourished.  HENT:  Head: Normocephalic and atraumatic.  Eyes: Pupils are equal, round, and reactive to light.  Neck: Normal range of motion. Neck supple.  Cardiovascular: Normal rate, regular rhythm and normal  heart sounds.  Pulmonary/Chest: Effort normal. No accessory muscle usage. Tachypnea noted. No respiratory distress. He has wheezes. He has no rales. He exhibits no tenderness.  Abdominal: Soft. Bowel sounds are normal. There is no tenderness. There is no rebound and no guarding.  Musculoskeletal: Normal range of motion. He exhibits no edema.  Trace edema to the bilateral lower extremities, no calf tenderness  Lymphadenopathy:    He has no cervical adenopathy.  Neurological: He is alert and oriented to person, place, and time.  Skin: Skin is warm and dry. No rash noted.  Psychiatric: He has a normal mood and affect.     ED Treatments / Results  Labs (all labs ordered are listed, but only abnormal results are displayed) Labs Reviewed  BASIC METABOLIC PANEL - Abnormal; Notable for the following components:      Result Value   Calcium 8.1 (*)    All other components within normal limits  CBC WITH DIFFERENTIAL/PLATELET - Abnormal; Notable for the following components:   WBC 2.2 (*)    RBC 2.98 (*)    Hemoglobin 8.0 (*)    HCT 26.6 (*)    RDW 19.0 (*)    Platelets 124 (*)    Neutro Abs 1.2 (*)    Lymphs Abs 0.6 (*)    All other components within normal limits  CULTURE, BLOOD (ROUTINE X 2)  CULTURE, BLOOD (ROUTINE X 2)  TROPONIN I  D-DIMER, QUANTITATIVE (NOT AT [facility])  I-STAT CG4 LACTIC ACID, ED    EKG EKG Interpretation  Date/Time:  Sunday January 31 2018 21:13:43  EDT Ventricular Rate:  85 PR Interval:    QRS Duration: 95 QT Interval:  358 QTC Calculation: 426 R Axis:   76 Text Interpretation:  Sinus rhythm RSR' in V1 or V2, right VCD or RVH since last tracing no significant change Confirmed by Malvin Johns [phone]) on 01/31/2018 9:16:57 PM   Radiology Dg Chest 2 View  Result Date: 01/31/2018 CLINICAL DATA:  Increased respiratory effort for 6 days. Fever. Increased cough and congestion. Previous smoker. History of multiple myeloma. EXAM: CHEST - 2 VIEW COMPARISON:   11/10/2017 FINDINGS: Heart size and pulmonary vascularity are normal. Increased opacity in the right base similar to previous study. This could reflect elevation of the hemidiaphragm or sub pulmonic effusion. Linear atelectasis in the lung bases. Since the previous study, there is development of mild perihilar infiltration in the right upper lung. This could reflect superimposed pneumonia. No blunting of costophrenic angles. No pneumothorax. Mediastinal contours appear intact. Old bilateral rib fractures. Multiple thoracic and lumbar spine compression deformities with kyphoplasty changes. Bone changes are consistent with history of myeloma. IMPRESSION: Persistent elevation of the right hemidiaphragm versus subpulmonic effusion. Probable developing infiltrate in the right upper lung. Bone changes consistent with history of myeloma. Electronically Signed   By: Lucienne Capers M.D.   On: 01/31/2018 21:53    Procedures Procedures (including critical care time)  Medications Ordered in ED Medications  ceFEPIme (MAXIPIME) 1 g in sodium chloride 0.9 % 100 mL IVPB (has no administration in time range)  vancomycin (VANCOCIN) IVPB 1000 mg/200 mL premix (has no administration in time range)  albuterol (PROVENTIL) (2.5 MG/3ML) 0.083% nebulizer solution 5 mg (5 mg Nebulization Given 01/31/18 2146)     Initial Impression / Assessment and Plan / ED Course  I have reviewed the triage vital signs and the nursing notes.  Pertinent labs & imaging results that were available during my care of the patient were reviewed by me and considered in my medical decision making (see chart for details).     Patient is a 62 year old male with a history of multiple myeloma who is on Revlimid.  He has evidence of a right sided lung opacity.  He is not requiring oxygen but has received a nebulizer treatment and is feeling much better with this.  He does not currently have sirs criteria for sepsis.  He was treated for healthcare  associated pneumonia given his recent hospitalization for pneumonia.  I spoke with Dr. Hal Hope who will admit the patient for further treatment.  Final Clinical Impressions(s) / ED Diagnoses   Final diagnoses:  HCAP (healthcare-associated pneumonia)    ED Discharge Orders    None       Malvin Johns, MD 01/31/18 2238

## 2018-01-31 NOTE — ED Notes (Signed)
Patient transported to X-ray 

## 2018-02-01 ENCOUNTER — Encounter (HOSPITAL_COMMUNITY): Payer: Self-pay

## 2018-02-01 DIAGNOSIS — D61818 Other pancytopenia: Secondary | ICD-10-CM | POA: Diagnosis present

## 2018-02-01 LAB — BASIC METABOLIC PANEL
ANION GAP: 7 (ref 5–15)
BUN: 8 mg/dL (ref 8–23)
CHLORIDE: 108 mmol/L (ref 98–111)
CO2: 26 mmol/L (ref 22–32)
CREATININE: 0.89 mg/dL (ref 0.61–1.24)
Calcium: 8.2 mg/dL — ABNORMAL LOW (ref 8.9–10.3)
GFR calc non Af Amer: >60 mL/min (ref >60)
GLUCOSE: 96 mg/dL (ref 70–99)
Potassium: 4 mmol/L (ref 3.5–5.1)
Sodium: 141 mmol/L (ref 135–145)

## 2018-02-01 LAB — CBC
HCT: 27.9 % — ABNORMAL LOW (ref 39.0–52.0)
Hemoglobin: 8.1 g/dL — ABNORMAL LOW (ref 13.0–17.0)
MCH: 25.9 pg — AB (ref 26.0–34.0)
MCHC: 29 g/dL — ABNORMAL LOW (ref 30.0–36.0)
MCV: 89.1 fL (ref 78.0–100.0)
PLATELETS: 150 10*3/uL (ref 150–400)
RBC: 3.13 MIL/uL — ABNORMAL LOW (ref 4.22–5.81)
RDW: 19.2 % — AB (ref 11.5–15.5)
WBC: 2 10*3/uL — ABNORMAL LOW (ref 4.0–10.5)

## 2018-02-01 LAB — PROCALCITONIN: Procalcitonin: <0.1 ng/mL

## 2018-02-01 LAB — STREP PNEUMONIAE URINARY ANTIGEN: Strep Pneumo Urinary Antigen: NEGATIVE

## 2018-02-01 MED ORDER — ALBUTEROL SULFATE (2.5 MG/3ML) 0.083% IN NEBU
3.0000 mL | INHALATION_SOLUTION | Freq: Four times a day (QID) | RESPIRATORY_TRACT | Status: DC | PRN
  Administered 2018-02-01 (×2): 3 mL via RESPIRATORY_TRACT
  Filled 2018-02-01 (×2): qty 3

## 2018-02-01 MED ORDER — SENNA 8.6 MG PO TABS: 1.0000 | ORAL_TABLET | Freq: Every day | ORAL | Status: DC | PRN

## 2018-02-01 MED ORDER — ROPINIROLE HCL 1 MG PO TABS: 0.5000 mg | ORAL_TABLET | Freq: Every evening | ORAL | Status: DC | PRN

## 2018-02-01 MED ORDER — RIVAROXABAN 10 MG PO TABS
10.0000 mg | ORAL_TABLET | Freq: Every evening | ORAL | Status: DC
  Administered 2018-02-01 – 2018-02-04 (×5): 10 mg via ORAL
  Filled 2018-02-01 (×5): qty 1

## 2018-02-01 MED ORDER — POLYETHYLENE GLYCOL 3350 17 GM/SCOOP PO POWD
17.0000 g | Freq: Every day | ORAL | Status: DC | PRN
  Filled 2018-02-01: qty 255

## 2018-02-01 MED ORDER — ONDANSETRON HCL 4 MG PO TABS: 4.0000 mg | ORAL_TABLET | Freq: Four times a day (QID) | ORAL | Status: DC | PRN

## 2018-02-01 MED ORDER — ZOLPIDEM TARTRATE 5 MG PO TABS
5.0000 mg | ORAL_TABLET | Freq: Once | ORAL | Status: AC
  Administered 2018-02-01: 5 mg via ORAL
  Filled 2018-02-01: qty 1

## 2018-02-01 MED ORDER — VANCOMYCIN HCL IN DEXTROSE 1-5 GM/200ML-% IV SOLN
1000.0000 mg | Freq: Two times a day (BID) | INTRAVENOUS | Status: DC
  Administered 2018-02-01 – 2018-02-04 (×6): 1000 mg via INTRAVENOUS
  Filled 2018-02-01 (×6): qty 200

## 2018-02-01 MED ORDER — BENZONATATE 100 MG PO CAPS
200.0000 mg | ORAL_CAPSULE | Freq: Three times a day (TID) | ORAL | Status: DC | PRN
  Administered 2018-02-01 – 2018-02-03 (×4): 200 mg via ORAL
  Filled 2018-02-01 (×4): qty 2

## 2018-02-01 MED ORDER — SERTRALINE HCL 100 MG PO TABS
100.0000 mg | ORAL_TABLET | Freq: Every day | ORAL | Status: DC
  Administered 2018-02-01 – 2018-02-03 (×3): 100 mg via ORAL
  Filled 2018-02-01 (×3): qty 1

## 2018-02-01 MED ORDER — ONDANSETRON HCL 4 MG/2ML IJ SOLN: 4.0000 mg | Freq: Four times a day (QID) | INTRAMUSCULAR | Status: DC | PRN

## 2018-02-01 MED ORDER — ACETAMINOPHEN 325 MG PO TABS
650.0000 mg | ORAL_TABLET | Freq: Four times a day (QID) | ORAL | Status: DC | PRN
  Administered 2018-02-01 – 2018-02-03 (×6): 650 mg via ORAL
  Filled 2018-02-01 (×6): qty 2

## 2018-02-01 MED ORDER — ONDANSETRON 4 MG PO TBDP: 8.0000 mg | ORAL_TABLET | Freq: Three times a day (TID) | ORAL | Status: DC | PRN

## 2018-02-01 MED ORDER — ZOLPIDEM TARTRATE 10 MG PO TABS
10.0000 mg | ORAL_TABLET | Freq: Every evening | ORAL | Status: DC | PRN
  Administered 2018-02-01 – 2018-02-03 (×3): 10 mg via ORAL
  Filled 2018-02-01 (×3): qty 1

## 2018-02-01 MED ORDER — MAGNESIUM OXIDE 400 (241.3 MG) MG PO TABS
400.0000 mg | ORAL_TABLET | Freq: Every day | ORAL | Status: DC
  Administered 2018-02-01 – 2018-02-04 (×4): 400 mg via ORAL
  Filled 2018-02-01 (×4): qty 1

## 2018-02-01 MED ORDER — FAMCICLOVIR 500 MG PO TABS
500.0000 mg | ORAL_TABLET | Freq: Every day | ORAL | Status: DC
  Administered 2018-02-01 – 2018-02-04 (×4): 500 mg via ORAL
  Filled 2018-02-01 (×4): qty 1

## 2018-02-01 MED ORDER — SODIUM CHLORIDE 0.9 % IV SOLN
1.0000 g | Freq: Three times a day (TID) | INTRAVENOUS | Status: DC
  Administered 2018-02-01 – 2018-02-04 (×9): 1 g via INTRAVENOUS
  Filled 2018-02-01 (×11): qty 1

## 2018-02-01 MED ORDER — ACETAMINOPHEN 650 MG RE SUPP: 650.0000 mg | Freq: Four times a day (QID) | RECTAL | Status: DC | PRN

## 2018-02-01 MED ORDER — POLYETHYLENE GLYCOL 3350 17 G PO PACK: 17.0000 g | PACK | Freq: Every day | ORAL | Status: DC | PRN

## 2018-02-01 MED ORDER — ALBUTEROL SULFATE (2.5 MG/3ML) 0.083% IN NEBU
2.5000 mg | INHALATION_SOLUTION | Freq: Four times a day (QID) | RESPIRATORY_TRACT | Status: DC
  Administered 2018-02-01 – 2018-02-03 (×7): 2.5 mg via RESPIRATORY_TRACT
  Filled 2018-02-01 (×6): qty 3

## 2018-02-01 NOTE — ED Notes (Signed)
Report given to: Heath Lark, RN at [facility].

## 2018-02-01 NOTE — Progress Notes (Signed)
Pharmacy Antibiotic Note  Jorge [name] is a 62 y.o. male admitted on 01/31/2018 with  history of multiple myeloma who presents with shortness of breath.  He states he has had a cough for about the last week. Pharmacy has been consulted for vancomycini for pneumonia.  Plan: Vancomycin 1gm IV x 1 in ED then 1gm Q12h (AUC 543.5, Scr 0.9) Cefepime 1gm IV q8h per MD Follow renal function, cultures and clinical course  Height: '5\' 7"'  (170.2 cm) Weight: 175 lb 0.7 oz (79.4 kg) IBW/kg (Calculated) : 66.1  Temp (24hrs), Avg:98.6 F (37 C), Min:98 F (36.7 C), Max:99.1 F (37.3 C)  Recent Labs  Lab 01/31/18 2128 01/31/18 2229  WBC 2.2*  --   CREATININE 0.90  --   LATICACIDVEN  --  1.03    Estimated Creatinine Clearance: 87 mL/min (by C-G formula based on SCr of 0.9 mg/dL).    No Known Allergies  Antimicrobials this admission: 7/21 cefepime >> 7/22 vanc >> Dose adjustments this admission:   Microbiology results: 7/21 BCx:  7/21 Sputum:    Thank you for allowing pharmacy to be a part of this patient's care.  Dolly Rias RPh 02/01/2018, 3:13 AM Pager [phone]

## 2018-02-01 NOTE — Progress Notes (Signed)
PROGRESS NOTE    Jorge [name]  GOV:[id number] DOB: January 05, 1956 DOA: 01/31/2018 PCP: Mackie Pai, PA-C   Brief Narrative:  62 year old with past medical history relevant for multiple myeloma status post bone marrow transplant with residual disease on lenalidomide, history of DVT on rivaroxaban who was admitted for cough and shortness of breath and found to have pneumonia.   Assessment & Plan:   Principal Problem:   HCAP (healthcare-associated pneumonia) Active Problems:   Multiple myeloma ([place])   Pancytopenia ([place])   #) Pneumonia in the setting of immunocompromise: Suspect most likely bacterial.  Today he is febrile to 100.6.  Interestingly enough his travel to [place] recently raising the specter that this could possibly be an endemic fungi such as coccidiomycosis. -Urine histoplasmosis antigen -Urine Legionella and strep pneumo antigen - Continue IV cefepime and vancomycin started 01/31/2018 - Follow-up blood cultures from 01/31/2018  #) IgG kappa multiple myeloma status post bone marrow transplant with residual disease: -Hold lenalidomide - Continue famciclovir 500 mg daily  #) Pancytopenia: Likely secondary to bone marrow transplant and lenalidomide -Monitor   #) DVT: -Continue rivaroxaban 10 mg  #) Pain/psych: -Continue sertraline 100 mg daily  Fluids: Tolerating p.o. Elect lites: Monitor and supplement Exline nutrition: Regular diet  Prophylaxis: On rivaroxaban  Disposition: Pending resolution of pneumonia  Full code   Consultants:   None  Procedures:  None  Antimicrobials:  IV cefepime and vancomycin started 01/31/2018 to ongoing   Subjective: Patient reports that he is feeling better.  He continues to have cough with no shortness of breath.  He denies any nausea, vomiting, diarrhea.  Objective: Vitals:   02/01/18 0100 02/01/18 0704 02/01/18 0928 02/01/18 1325  BP: 116/79 113/66  119/69  Pulse: 98 91  (!) 101  Resp: '20 20  20  ' Temp: 98 F  (36.7 C)   (!) 100.6 F (38.1 C)  TempSrc: Oral   Oral  SpO2: 97% 94% 94% 95%  Weight: 79.4 kg (175 lb 0.7 oz)     Height: '5\' 7"'  (1.702 m)       Intake/Output Summary (Last 24 hours) at 02/01/2018 1414 Last data filed at 02/01/2018 0100 Gross per 24 hour  Intake 316.67 ml  Output 0 ml  Net 316.67 ml   Filed Weights   01/31/18 2107 02/01/18 0100  Weight: 79.4 kg (175 lb) 79.4 kg (175 lb 0.7 oz)    Examination:  General exam: Appears calm and comfortable  Respiratory system: No increased work of breathing, intermittent rhonchi and wheezes in right lung greater than left, no crackles Cardiovascular system: Regular rate and rhythm, no murmurs Gastrointestinal system: Abdomen is nondistended, soft and nontender. No organomegaly or masses felt. Normal bowel sounds heard. Central nervous system: Alert and oriented. No focal neurological deficits. Extremities: No lower extremity edema. Skin: No rashes on visible skin Psychiatry: Judgement and insight appear normal. Mood & affect appropriate.     Data Reviewed: I have personally reviewed following labs and imaging studies  CBC: Recent Labs  Lab 01/31/18 2128 02/01/18 0516  WBC 2.2* 2.0*  NEUTROABS 1.2*  --   HGB 8.0* 8.1*  HCT 26.6* 27.9*  MCV 89.3 89.1  PLT 124* 818   Basic Metabolic Panel: Recent Labs  Lab 01/31/18 2128 02/01/18 0516  NA 140 141  K 3.7 4.0  CL 110 108  CO2 23 26  GLUCOSE 78 96  BUN 9 8  CREATININE 0.90 0.89  CALCIUM 8.1* 8.2*   GFR: Estimated Creatinine Clearance: 88 mL/min (by  C-G formula based on SCr of 0.89 mg/dL). Liver Function Tests: No results for input(s): AST, ALT, ALKPHOS, BILITOT, PROT, ALBUMIN in the last 168 hours. No results for input(s): LIPASE, AMYLASE in the last 168 hours. No results for input(s): AMMONIA in the last 168 hours. Coagulation Profile: No results for input(s): INR, PROTIME in the last 168 hours. Cardiac Enzymes: Recent Labs  Lab 01/31/18 2128  TROPONINI  <0.03   BNP (last 3 results) No results for input(s): PROBNP in the last 8760 hours. HbA1C: No results for input(s): HGBA1C in the last 72 hours. CBG: No results for input(s): GLUCAP in the last 168 hours. Lipid Profile: No results for input(s): CHOL, HDL, LDLCALC, TRIG, CHOLHDL, LDLDIRECT in the last 72 hours. Thyroid Function Tests: No results for input(s): TSH, T4TOTAL, FREET4, T3FREE, THYROIDAB in the last 72 hours. Anemia Panel: No results for input(s): VITAMINB12, FOLATE, FERRITIN, TIBC, IRON, RETICCTPCT in the last 72 hours. Sepsis Labs: Recent Labs  Lab 01/31/18 2229 02/01/18 0825  PROCALCITON  --  <0.10  LATICACIDVEN 1.03  --     No results found for this or any previous visit (from the past 240 hour(s)).       Radiology Studies: Dg Chest 2 View  Result Date: 01/31/2018 CLINICAL DATA:  Increased respiratory effort for 6 days. Fever. Increased cough and congestion. Previous smoker. History of multiple myeloma. EXAM: CHEST - 2 VIEW COMPARISON:  11/10/2017 FINDINGS: Heart size and pulmonary vascularity are normal. Increased opacity in the right base similar to previous study. This could reflect elevation of the hemidiaphragm or sub pulmonic effusion. Linear atelectasis in the lung bases. Since the previous study, there is development of mild perihilar infiltration in the right upper lung. This could reflect superimposed pneumonia. No blunting of costophrenic angles. No pneumothorax. Mediastinal contours appear intact. Old bilateral rib fractures. Multiple thoracic and lumbar spine compression deformities with kyphoplasty changes. Bone changes are consistent with history of myeloma. IMPRESSION: Persistent elevation of the right hemidiaphragm versus subpulmonic effusion. Probable developing infiltrate in the right upper lung. Bone changes consistent with history of myeloma. Electronically Signed   By: Lucienne Capers M.D.   On: 01/31/2018 21:53        Scheduled Meds: .  famciclovir  500 mg Oral Daily  . magnesium oxide  400 mg Oral Daily  . rivaroxaban  10 mg Oral QPM  . sertraline  100 mg Oral QHS   Continuous Infusions: . ceFEPime (MAXIPIME) IV Stopped (02/01/18 1410)  . vancomycin Stopped (02/01/18 1241)     LOS: 1 day    Time spent: [place], MD [facility]  If 7PM-7AM, please contact night-coverage [email or web address] Password [facility] 02/01/2018, 2:14 PM

## 2018-02-01 NOTE — H&P (Signed)
History and Physical    Jorge [name] HWE:[id number] DOB: 1955/09/23 DOA: 01/31/2018  PCP: Mackie Pai, PA-C  Patient coming from: Home.  Chief Complaint: Shortness of breath.  HPI: [name] is a 62 y.o. male with history of multiple myeloma on Revlimid, history of DVT on Xarelto presents to the ER admits in [facility] with complaints of shortness of breath and productive cough.  Patient recently traveled to [place] last week and started having some shortness of breath which patient is here could be from altitude.  Patient gradually had worsening shortness of breath with cough and productive sputum which further worsened after he reached reasonable yesterday.  At home patient recorded a temperature of 102 F.  Was able to bring some sputum on deep coughing.  Denies any chest pain.  Has not taken Revlimid due to the symptoms for last 1 day.  Patient was admitted in May this year for pneumonia.  ED Course: In the ER patient had chest x-ray which shows features concerning for infiltrates possible pneumonia.  Patient started on empiric antibiotics for pneumonia.  Lab work showed pancytopenia in addition.  Review of Systems: As per HPI, rest all negative.   Past Medical History:  Diagnosis Date  . Anxiety   . Bone metastasis ([place])   . Chest cold 05/19/2016   productive cough  -- started on antibiotic  . Chronic back pain    due to bone mets from myeloma  . Cough   . Depression   . GERD (gastroesophageal reflux disease)   . Hiatal hernia   . History of chicken pox   . History of concussion    age 39 -- no residual  . History of DVT of lower extremity 03/21/2016  treated and completed w/ xarelto   per doppler left extensive occlusion common femoral, femoral, and popliteal veins and right partial occlusion common femoral and profunda femoral veins/  last doppler 06-04-2016 no evidence acute or chronic dvt noted either leg   . History of radiation therapy 06/09/16-06/23/16   lower  thoracic spine 25 Gy in 10 fractions  . Mouth ulcers    secondary to radiation  . Multiple myeloma ([place]) dx 02/22/2016 via bone marrow bx---  oncologist-  [name]   IgG Kappa-- Hyperdiploid/ +11 w/ bone mets--  current treatment chemotherapy (started 08/ 2017)and pallitive radiation to back started 06-09-2016  . Renal calculus, right   . Wears contact lenses     Past Surgical History:  Procedure Laterality Date  . COLONOSCOPY  [id number]  . CYSTOSCOPY W/ URETERAL STENT PLACEMENT Right 06/20/2016   Procedure: CYSTOSCOPY WITH STENT REPLACEMENT;  Surgeon: Kathie Rhodes, MD;  Location: [facility];  Service: Urology;  Laterality: Right;  . CYSTOSCOPY WITH RETROGRADE PYELOGRAM, URETEROSCOPY AND STENT PLACEMENT Right 05/30/2016   Procedure: CYSTOSCOPY WITH RETROGRADE PYELOGRAM, URETEROSCOPY AND STENT PLACEMENT,DILITATION URETERAL STRICTURE;  Surgeon: Kathie Rhodes, MD;  Location: [facility];  Service: Urology;  Laterality: Right;  . CYSTOSCOPY/RETROGRADE/URETEROSCOPY/STONE EXTRACTION WITH BASKET Right 06/20/2016   Procedure: CYSTOSCOPY/URETEROSCOPY/STONE EXTRACTION WITH BASKET;  Surgeon: Kathie Rhodes, MD;  Location: [facility];  Service: Urology;  Laterality: Right;  . HOLMIUM LASER APPLICATION Right 89/09/8099   Procedure: HOLMIUM LASER APPLICATION;  Surgeon: Kathie Rhodes, MD;  Location: [facility];  Service: Urology;  Laterality: Right;  . IR GENERIC HISTORICAL  02/11/2016   IR RADIOLOGIST EVAL & MGMT 02/11/2016 [facility]  . IR GENERIC HISTORICAL  02/15/2016   IR BONE TUMOR(S)RF ABLATION 02/15/2016  Luanne Bras, MD [facility]  . IR GENERIC HISTORICAL  02/15/2016   IR BONE TUMOR(S)RF ABLATION 02/15/2016 Luanne Bras, MD [facility]  . IR GENERIC HISTORICAL  02/15/2016   IR BONE TUMOR(S)RF ABLATION 02/15/2016 Luanne Bras, MD [facility]  . IR GENERIC HISTORICAL  02/15/2016   IR KYPHO THORACIC WITH BONE BIOPSY 02/15/2016 Luanne Bras, MD  [facility]  . IR GENERIC HISTORICAL  02/15/2016   IR KYPHO THORACIC WITH BONE BIOPSY 02/15/2016 Luanne Bras, MD [facility]  . IR GENERIC HISTORICAL  02/15/2016   IR VERTEBROPLASTY CERV/THOR BX INC UNI/BIL INC/INJECT/IMAGING 02/15/2016 Luanne Bras, MD [facility]  . IR GENERIC HISTORICAL  03/13/2016   IR KYPHO EA ADDL LEVEL THORACIC OR LUMBAR 03/13/2016 Luanne Bras, MD [facility]  . IR GENERIC HISTORICAL  03/13/2016   IR KYPHO EA ADDL LEVEL THORACIC OR LUMBAR 03/13/2016 Luanne Bras, MD [facility]  . IR GENERIC HISTORICAL  03/13/2016   IR BONE TUMOR(S)RF ABLATION 03/13/2016 Luanne Bras, MD [facility]  . IR GENERIC HISTORICAL  03/13/2016   IR KYPHO LUMBAR INC FX REDUCE BONE BX UNI/BIL CANNULATION INC/IMAGING 03/13/2016 Luanne Bras, MD [facility]  . IR GENERIC HISTORICAL  03/13/2016   IR BONE TUMOR(S)RF ABLATION 03/13/2016 Luanne Bras, MD [facility]  . IR GENERIC HISTORICAL  03/13/2016   IR BONE TUMOR(S)RF ABLATION 03/13/2016 Luanne Bras, MD [facility]  . IR GENERIC HISTORICAL  03/31/2016   IR RADIOLOGIST EVAL & MGMT 03/31/2016 [facility]  . LAPAROSCOPIC INGUINAL HERNIA REPAIR Bilateral 12-16-2013  dr gross  . RADIOLOGY WITH ANESTHESIA N/A 02/15/2016   Procedure: Spinal Ablation;  Surgeon: Luanne Bras, MD;  Location: [place];  Service: Radiology;  Laterality: N/A;  . RADIOLOGY WITH ANESTHESIA N/A 03/13/2016   Procedure: LUMBER ABLATION;  Surgeon: Luanne Bras, MD;  Location: [place];  Service: Radiology;  Laterality: N/A;  . ROTATOR CUFF REPAIR Right 2003  . TONSILLECTOMY  age 7  . WISDOM TOOTH EXTRACTION       reports that he quit smoking about 37 years ago. His smoking use included cigarettes. He has a 7.00 pack-year smoking history. He has never used smokeless tobacco. He reports that he drinks alcohol. He reports that he does not use drugs.  No Known Allergies  Family History  Problem Relation Age of Onset  . Uterine cancer  Mother   . Heart disease Father   . Hypertension Father   . Multiple sclerosis Sister   . Paranoid behavior Brother   . Drug abuse Brother   . Schizophrenia Brother   . Stroke Maternal Grandfather   . Cancer Maternal Aunt   . Leukemia Paternal Aunt   . Healthy Son        x1  . Healthy Daughter        x2  . Allergies Daughter        x1  . Diabetes Neg Hx   . Alzheimer's disease Neg Hx   . Parkinson's disease Neg Hx     Prior to Admission medications   Medication Sig Start Date End Date Taking? Authorizing Provider  albuterol (PROVENTIL HFA;VENTOLIN HFA) 108 (90 Base) MCG/ACT inhaler Inhale 2 puffs into the lungs every 6 (six) hours as needed for wheezing or shortness of breath. 11/13/17  Yes Jani Gravel, MD  benzonatate (TESSALON) 200 MG capsule Take 1 capsule (200 mg total) by mouth 3 (three) times daily as needed for cough. 09/09/17  Yes [name], MD  cetirizine (ZYRTEC) 10 MG tablet Take  10 mg by mouth daily as needed for allergies.   Yes [provider]  cholecalciferol (VITAMIN D) 1000 units tablet Take 1,000 Units by mouth daily.   Yes [provider]  famciclovir (FAMVIR) 500 MG tablet Take 1 tablet (500 mg total) by mouth daily. 11/02/17  Yes [name], MD  lenalidomide (REVLIMID) 10 MG capsule TAKE 1 CAPSULE BY MOUTH EVERY DAY FOR 21 DAYS, THEN 7 DAYS OFF OMVE#[id number] 01/07/18  Yes Ennever, Rudell Cobb, MD  lenalidomide (REVLIMID) 10 MG capsule TAKE 1 CAPSULE BY MOUTH EVERY DAY FOR 21 DAYS, THEN 7 DAYS OFF 01/16/18  Yes Ennever, Rudell Cobb, MD  lidocaine (LIDODERM) 5 % PLAEC 1 PATCH ONTO THE SKIN DAILY as needed for pain. APPLY 1 PATCH TO THE MOST PAINFUL AREA FOR 12 HR IN A 24 HR PERIOD 12/19/16  Yes [provider]  magnesium oxide (MAG-OX) 400 MG tablet Take 400 mg by mouth daily.   Yes [provider]  ondansetron (ZOFRAN-ODT) 8 MG disintegrating tablet Take 1 tablet (8 mg total) by mouth every 8 (eight) hours as needed. Patient taking  differently: Take 8 mg by mouth every 8 (eight) hours as needed for nausea.  09/12/16  Yes [name], MD  OVER THE COUNTER MEDICATION Take 1 tablet by mouth daily.    Yes [provider]  Probiotic Product (PROBIOTIC PO) Take 1 capsule by mouth daily.   Yes [provider]  rOPINIRole (REQUIP) 0.25 MG tablet TAKE 1 TABLET BY MOUTH 1 TO 3 HOURS BEFORE BED FOR 2 DAYS THEN INCREASE TO 2 TABS Patient taking differently: TAKE 2 TABLETS (0.40m) BY MOUTH 1 TO 3 HOURS BEFORE BED as needed for restless legs. 09/07/17  Yes [place], SHolli Humbles NP  senna (SENOKOT) 8.6 MG TABS tablet Take 2 tablets (17.2 mg total) by mouth daily. Patient taking differently: Take 1 tablet by mouth daily as needed for mild constipation.  05/07/16  Yes [place], SHolli Humbles NP  sertraline (ZOLOFT) 100 MG tablet Take 100 mg by mouth at bedtime.  01/02/16  Yes [provider]  XARELTO 10 MG TABS tablet Take 1 tablet (10 mg total) by mouth daily. Patient taking differently: Take 10 mg by mouth every evening.  11/02/17  Yes Ennever, PRudell Cobb MD  Zoledronic Acid (ZOMETA IV) Inject 4 mg into the vein every 3 (three) months. Receives at Dr. EAntonieta Pertoffice.   Yes [provider]  zolpidem (AMBIEN) 10 MG tablet Take 1 tablet (10 mg total) by mouth at bedtime as needed. 01/06/18  Yes Ennever, PRudell Cobb MD  amoxicillin (AMOXIL) 500 MG tablet Take one hour before dental procedure Patient taking differently: Take 500 mg by mouth See admin instructions. Take one hour before dental procedure 09/28/17   EVolanda Napoleon MD  polyethylene glycol powder (MIRALAX) powder Take 17 g by mouth daily. Patient taking differently: Take 17 g by mouth daily as needed for mild constipation.  03/26/16   EVolanda Napoleon MD    Physical Exam: Vitals:   01/31/18 2300 01/31/18 2320 01/31/18 2340 02/01/18 0100  BP: 111/74 116/79 134/74 116/79  Pulse: 100 98 99 98  Resp: (!) 23 (!) 21 (!) 26 20  Temp:    98 F (36.7 C)    TempSrc:    Oral  SpO2: 97% 95% 97% 97%  Weight:    79.4 kg (175 lb 0.7 oz)  Height:    '5\' 7"'  (1.702 m)      Constitutional: Moderately  built and nourished. Vitals:   01/31/18 2300 01/31/18 2320 01/31/18 2340 02/01/18 0100  BP: 111/74 116/79 134/74 116/79  Pulse: 100 98 99 98  Resp: (!) 23 (!) 21 (!) 26 20  Temp:    98 F (36.7 C)  TempSrc:    Oral  SpO2: 97% 95% 97% 97%  Weight:    79.4 kg (175 lb 0.7 oz)  Height:    '5\' 7"'  (1.702 m)   Eyes: Anicteric no pallor. ENMT: No discharge from the ears eyes nose or mouth. Neck: No mass felt.  No neck rigidity.  No JVD appreciated. Respiratory: No rhonchi or crepitations. Cardiovascular: S1-S2 heard no murmurs appreciated. Abdomen: Soft nontender bowel sounds present. Musculoskeletal: No edema.  No joint effusion. Skin: No rash.  Skin appears warm. Neurologic: Alert awake oriented to time place and person.  Moves all extremities. Psychiatric: Appears normal per normal affect.   Labs on Admission: I have personally reviewed following labs and imaging studies  CBC: Recent Labs  Lab 01/31/18 2128  WBC 2.2*  NEUTROABS 1.2*  HGB 8.0*  HCT 26.6*  MCV 89.3  PLT 037*   Basic Metabolic Panel: Recent Labs  Lab 01/31/18 2128  NA 140  K 3.7  CL 110  CO2 23  GLUCOSE 78  BUN 9  CREATININE 0.90  CALCIUM 8.1*   GFR: Estimated Creatinine Clearance: 87 mL/min (by C-G formula based on SCr of 0.9 mg/dL). Liver Function Tests: No results for input(s): AST, ALT, ALKPHOS, BILITOT, PROT, ALBUMIN in the last 168 hours. No results for input(s): LIPASE, AMYLASE in the last 168 hours. No results for input(s): AMMONIA in the last 168 hours. Coagulation Profile: No results for input(s): INR, PROTIME in the last 168 hours. Cardiac Enzymes: Recent Labs  Lab 01/31/18 2128  TROPONINI <0.03   BNP (last 3 results) No results for input(s): PROBNP in the last 8760 hours. HbA1C: No results for input(s): HGBA1C in the last 72  hours. CBG: No results for input(s): GLUCAP in the last 168 hours. Lipid Profile: No results for input(s): CHOL, HDL, LDLCALC, TRIG, CHOLHDL, LDLDIRECT in the last 72 hours. Thyroid Function Tests: No results for input(s): TSH, T4TOTAL, FREET4, T3FREE, THYROIDAB in the last 72 hours. Anemia Panel: No results for input(s): VITAMINB12, FOLATE, FERRITIN, TIBC, IRON, RETICCTPCT in the last 72 hours. Urine analysis:    Component Value Date/Time   COLORURINE YELLOW 01/21/2018 0738   APPEARANCEUR CLEAR 01/21/2018 0738   LABSPEC 1.015 01/21/2018 0738   PHURINE 6.5 01/21/2018 0738   GLUCOSEU NEGATIVE 01/21/2018 0738   HGBUR NEGATIVE 01/21/2018 0738   BILIRUBINUR NEGATIVE 01/21/2018 0738   KETONESUR NEGATIVE 01/21/2018 0738   PROTEINUR NEGATIVE 11/09/2017 1008   UROBILINOGEN 0.2 01/21/2018 0738   NITRITE NEGATIVE 01/21/2018 0738   LEUKOCYTESUR NEGATIVE 01/21/2018 0738   Sepsis Labs: '@LABRCNTIP' (procalcitonin:4,lacticidven:4) )No results found for this or any previous visit (from the past 240 hour(s)).   Radiological Exams on Admission: Dg Chest 2 View  Result Date: 01/31/2018 CLINICAL DATA:  Increased respiratory effort for 6 days. Fever. Increased cough and congestion. Previous smoker. History of multiple myeloma. EXAM: CHEST - 2 VIEW COMPARISON:  11/10/2017 FINDINGS: Heart size and pulmonary vascularity are normal. Increased opacity in the right base similar to previous study. This could reflect elevation of the hemidiaphragm or sub pulmonic effusion. Linear atelectasis in the lung bases. Since the previous study, there is development of mild perihilar infiltration in the right upper lung. This could reflect superimposed pneumonia. No blunting of costophrenic  angles. No pneumothorax. Mediastinal contours appear intact. Old bilateral rib fractures. Multiple thoracic and lumbar spine compression deformities with kyphoplasty changes. Bone changes are consistent with history of myeloma.  IMPRESSION: Persistent elevation of the right hemidiaphragm versus subpulmonic effusion. Probable developing infiltrate in the right upper lung. Bone changes consistent with history of myeloma. Electronically Signed   By: Lucienne Capers M.D.   On: 01/31/2018 21:53    EKG: Independently reviewed.  Normal sinus rhythm.  Assessment/Plan Principal Problem:   HCAP (healthcare-associated pneumonia) Active Problems:   Multiple myeloma ([place])   Pancytopenia ([place])    1. Healthcare associated pneumonia -patient placed on empiric antibiotics for healthcare associated pneumonia.  Check sputum cultures if patient able to produce.  Continue to monitor. 2. Pancytopenia likely related to multiple myeloma and medication -holding patient's Revlimid for now.  I have listed Dr. Burney Gauze patient's oncologist.  Will await further recommendations. 3. History of depression on Zoloft. 4. History of DVT on Xarelto.  Has not taken his dose today so I have ordered 1 dose now.   DVT prophylaxis: Xarelto. Code Status: Full code. Family Communication: Discussed with patient. Disposition Plan: Home. Consults called: Oncology. Admission status: Inpatient.   Rise Patience MD [facility] Pager [phone].  If 7PM-7AM, please contact night-coverage [email or web address] Password [email or web address]  02/01/2018, 2:58 AM

## 2018-02-02 ENCOUNTER — Inpatient Hospital Stay (HOSPITAL_COMMUNITY): Payer: 59

## 2018-02-02 ENCOUNTER — Ambulatory Visit: Payer: 59 | Admitting: Physical Therapy

## 2018-02-02 DIAGNOSIS — Z8701 Personal history of pneumonia (recurrent): Secondary | ICD-10-CM

## 2018-02-02 DIAGNOSIS — Z7901 Long term (current) use of anticoagulants: Secondary | ICD-10-CM

## 2018-02-02 DIAGNOSIS — C9000 Multiple myeloma not having achieved remission: Secondary | ICD-10-CM

## 2018-02-02 DIAGNOSIS — Z8619 Personal history of other infectious and parasitic diseases: Secondary | ICD-10-CM

## 2018-02-02 DIAGNOSIS — I82412 Acute embolism and thrombosis of left femoral vein: Secondary | ICD-10-CM

## 2018-02-02 DIAGNOSIS — R0602 Shortness of breath: Secondary | ICD-10-CM

## 2018-02-02 DIAGNOSIS — R5081 Fever presenting with conditions classified elsewhere: Secondary | ICD-10-CM

## 2018-02-02 DIAGNOSIS — Z87891 Personal history of nicotine dependence: Secondary | ICD-10-CM

## 2018-02-02 DIAGNOSIS — D709 Neutropenia, unspecified: Secondary | ICD-10-CM | POA: Diagnosis present

## 2018-02-02 DIAGNOSIS — D61818 Other pancytopenia: Secondary | ICD-10-CM

## 2018-02-02 DIAGNOSIS — J189 Pneumonia, unspecified organism: Secondary | ICD-10-CM

## 2018-02-02 DIAGNOSIS — Z86718 Personal history of other venous thrombosis and embolism: Secondary | ICD-10-CM

## 2018-02-02 LAB — BASIC METABOLIC PANEL
Anion gap: 6 (ref 5–15)
CO2: 25 mmol/L (ref 22–32)
Chloride: 105 mmol/L (ref 98–111)
Creatinine, Ser: 0.78 mg/dL (ref 0.61–1.24)
GFR calc non Af Amer: >60 mL/min (ref >60)
Glucose, Bld: 114 mg/dL — ABNORMAL HIGH (ref 70–99)
Potassium: 3.9 mmol/L (ref 3.5–5.1)

## 2018-02-02 LAB — URINALYSIS, ROUTINE W REFLEX MICROSCOPIC
Bilirubin Urine: NEGATIVE
GLUCOSE, UA: NEGATIVE mg/dL
Ketones, ur: NEGATIVE mg/dL
Leukocytes, UA: NEGATIVE
Nitrite: NEGATIVE
PH: 6 (ref 5.0–8.0)
PROTEIN: NEGATIVE mg/dL
Specific Gravity, Urine: 1.014 (ref 1.005–1.030)

## 2018-02-02 LAB — DIFFERENTIAL
Band Neutrophils: 9 %
Basophils Absolute: 0 K/uL (ref 0.0–0.1)
Basophils Relative: 1 %
Eosinophils Absolute: 0 10*3/uL (ref 0.0–0.7)
Eosinophils Relative: 0 %
Lymphocytes Relative: 20 %
Lymphs Abs: 0.3 10*3/uL — ABNORMAL LOW (ref 0.7–4.0)
Monocytes Absolute: 0.1 K/uL (ref 0.1–1.0)
Monocytes Relative: 9 %
Neutro Abs: 1.1 K/uL — ABNORMAL LOW (ref 1.7–7.7)
Neutrophils Relative %: 61 %

## 2018-02-02 LAB — MAGNESIUM: Magnesium: 2 mg/dL (ref 1.7–2.4)

## 2018-02-02 LAB — PROCALCITONIN: Procalcitonin: <0.1 ng/mL

## 2018-02-02 LAB — CBC
HCT: 27.2 % — ABNORMAL LOW (ref 39.0–52.0)
Hemoglobin: 8.1 g/dL — ABNORMAL LOW (ref 13.0–17.0)
MCH: 26.2 pg (ref 26.0–34.0)
MCHC: 29.8 g/dL — ABNORMAL LOW (ref 30.0–36.0)
MCV: 88 fL (ref 78.0–100.0)
Platelets: 115 10*3/uL — ABNORMAL LOW (ref 150–400)
RBC: 3.09 MIL/uL — ABNORMAL LOW (ref 4.22–5.81)
RDW: 19 % — ABNORMAL HIGH (ref 11.5–15.5)
WBC: 1.5 10*3/uL — ABNORMAL LOW (ref 4.0–10.5)

## 2018-02-02 LAB — BASIC METABOLIC PANEL WITH GFR
BUN: 8 mg/dL (ref 8–23)
Calcium: 7.9 mg/dL — ABNORMAL LOW (ref 8.9–10.3)
GFR calc Af Amer: >60 mL/min (ref >60)
Sodium: 136 mmol/L (ref 135–145)

## 2018-02-02 LAB — HISTOPLASMA ANTIGEN, URINE: Histoplasma Antigen, urine: <0.5 (ref <0.5)

## 2018-02-02 LAB — BRAIN NATRIURETIC PEPTIDE: B Natriuretic Peptide: 209.5 pg/mL — ABNORMAL HIGH (ref 0.0–100.0)

## 2018-02-02 LAB — LEGIONELLA PNEUMOPHILA SEROGP 1 UR AG: L. pneumophila Serogp 1 Ur Ag: NEGATIVE

## 2018-02-02 MED ORDER — FUROSEMIDE 10 MG/ML IJ SOLN
40.0000 mg | Freq: Two times a day (BID) | INTRAMUSCULAR | Status: AC
  Administered 2018-02-02 – 2018-02-04 (×4): 40 mg via INTRAVENOUS
  Filled 2018-02-02 (×3): qty 4

## 2018-02-02 MED ORDER — ALBUTEROL SULFATE (2.5 MG/3ML) 0.083% IN NEBU
3.0000 mL | INHALATION_SOLUTION | RESPIRATORY_TRACT | Status: DC | PRN
  Administered 2018-02-02: 3 mL via RESPIRATORY_TRACT
  Filled 2018-02-02: qty 3

## 2018-02-02 MED ORDER — SODIUM CHLORIDE 0.9 % IV BOLUS
500.0000 mL | Freq: Once | INTRAVENOUS | Status: AC
  Administered 2018-02-02: 500 mL via INTRAVENOUS

## 2018-02-02 MED ORDER — FUROSEMIDE 10 MG/ML IJ SOLN
40.0000 mg | Freq: Once | INTRAMUSCULAR | Status: DC
  Filled 2018-02-02: qty 4

## 2018-02-02 MED ORDER — POLYETHYLENE GLYCOL 3350 17 G PO PACK: 17.0000 g | PACK | Freq: Every day | ORAL | Status: DC | PRN

## 2018-02-02 MED ORDER — GUAIFENESIN ER 600 MG PO TB12
1200.0000 mg | ORAL_TABLET | Freq: Two times a day (BID) | ORAL | Status: DC
  Administered 2018-02-02 – 2018-02-04 (×5): 1200 mg via ORAL
  Filled 2018-02-02 (×5): qty 2

## 2018-02-02 MED ORDER — ALBUTEROL SULFATE (2.5 MG/3ML) 0.083% IN NEBU: 3.0000 mL | INHALATION_SOLUTION | RESPIRATORY_TRACT | Status: DC | PRN

## 2018-02-02 NOTE — Consult Note (Signed)
[facility] for Infectious Disease    Date of Admission:  01/31/2018   Total days of antibiotics: 2 vanco/cefpime               Reason for Consult: Neutropenic fever    Referring Provider: Thompson   Assessment: Neutropenic fever Multiple Myeloma  Plan: 1. Continue vanco.  2. Continue cefepime 3. Await his BCx 4. Send fungal serologies 5. If continued fever, add fluconazole  Comment- Hard pressed to call him pneumonia with today CXR clean. If he had endemic mycosis, could have organ involvement, in particular bone marrow. His counts however have been fairly stable. Could consider bone marrow bx if his fever returns or is unresponsive to fluconazole.   Thank you so much for this interesting consult,  Principal Problem:   Neutropenia with fever ([facility]) Active Problems:   Multiple myeloma ([place])   Acute deep vein thrombosis (DVT) of femoral vein of left lower extremity (HCC)   Iron deficiency anemia due to chronic blood loss   HCAP (healthcare-associated pneumonia)   Pancytopenia (HCC)   SOB (shortness of breath)   . albuterol  2.5 mg Nebulization Q6H  . famciclovir  500 mg Oral Daily  . furosemide  40 mg Intravenous Q12H  . guaiFENesin  1,200 mg Oral BID  . magnesium oxide  400 mg Oral Daily  . rivaroxaban  10 mg Oral QPM  . sertraline  100 mg Oral QHS    HPI: Garnie Borchardt is a 62 y.o. male with hx of multiple myeloma (dx 2017, had BMTxp, has received XRT, currently on revlimid), adm on 7-21 by 1 week of sob and dry cough. At home he had temp 102.   He had also returned from a plane trip to [place] and developed LE swelling. His ANC was 1200 on adm and his temp was normal.  He was not hypoxic. He was started on vanco/cefepime. His CXR was: Persistent elevation of the right hemidiaphragm versus subpulmonic effusion. Probable developing infiltrate in the right upper lung. Bone changes consistent with history of myeloma.  He had prev been in hospital 4/11-2017  (parainfluenza 3) and was treated for pna and enterococcal bacteremia.   He had prev DVT and had been on rivaroxaban.   We are asked to comment on risk of fungal pna in light of his trip to [place].  He was in [place] for 10 days, stayed in [place] and [place].   His ANC today is 1100. He had fever 102.6 today.  He does not have a port.   Review of Systems: Review of Systems  Constitutional: Positive for chills and fever. Negative for weight loss.  HENT: Negative for sore throat.   Respiratory: Positive for cough, sputum production and shortness of breath.   Cardiovascular: Positive for leg swelling.  Gastrointestinal: Negative for constipation and diarrhea.  Genitourinary: Negative for dysuria.  Please see HPI. All other systems reviewed and negative.   Past Medical History:  Diagnosis Date  . Anxiety   . Bone metastasis ([place])   . Chest cold 05/19/2016   productive cough  -- started on antibiotic  . Chronic back pain    due to bone mets from myeloma  . Cough   . Depression   . GERD (gastroesophageal reflux disease)   . Hiatal hernia   . History of chicken pox   . History of concussion    age 68 -- no residual  . History of DVT  of lower extremity 03/21/2016  treated and completed w/ xarelto   per doppler left extensive occlusion common femoral, femoral, and popliteal veins and right partial occlusion common femoral and profunda femoral veins/  last doppler 06-04-2016 no evidence acute or chronic dvt noted either leg   . History of radiation therapy 06/09/16-06/23/16   lower thoracic spine 25 Gy in 10 fractions  . Mouth ulcers    secondary to radiation  . Multiple myeloma ([place]) dx 02/22/2016 via bone marrow bx---  oncologist-  [name]   IgG Kappa-- Hyperdiploid/ +11 w/ bone mets--  current treatment chemotherapy (started 08/ 2017)and pallitive radiation to back started 06-09-2016  . Renal calculus, right   . Wears contact lenses     Social History   Tobacco Use  . Smoking  status: Former Smoker    Packs/day: 1.00    Years: 7.00    Pack years: 7.00    Types: Cigarettes    Last attempt to quit: 11/02/1980    Years since quitting: 37.2  . Smokeless tobacco: Never Used  Substance Use Topics  . Alcohol use: Yes    Comment: occasional  . Drug use: No    Family History  Problem Relation Age of Onset  . Uterine cancer Mother   . Heart disease Father   . Hypertension Father   . Multiple sclerosis Sister   . Paranoid behavior Brother   . Drug abuse Brother   . Schizophrenia Brother   . Stroke Maternal Grandfather   . Cancer Maternal Aunt   . Leukemia Paternal Aunt   . Healthy Son        x1  . Healthy Daughter        x2  . Allergies Daughter        x1  . Diabetes Neg Hx   . Alzheimer's disease Neg Hx   . Parkinson's disease Neg Hx      Medications:  Scheduled: . albuterol  2.5 mg Nebulization Q6H  . famciclovir  500 mg Oral Daily  . furosemide  40 mg Intravenous Q12H  . guaiFENesin  1,200 mg Oral BID  . magnesium oxide  400 mg Oral Daily  . rivaroxaban  10 mg Oral QPM  . sertraline  100 mg Oral QHS    Abtx:  Anti-infectives (From admission, onward)   Start     Dose/Rate Route Frequency Ordered Stop   02/01/18 1200  vancomycin (VANCOCIN) IVPB 1000 mg/200 mL premix     1,000 mg 200 mL/hr over 60 Minutes Intravenous Every 12 hours 02/01/18 0313     02/01/18 1000  famciclovir (FAMVIR) tablet 500 mg     500 mg Oral Daily 02/01/18 0258     02/01/18 0600  ceFEPIme (MAXIPIME) 1 g in sodium chloride 0.9 % 100 mL IVPB     1 g 200 mL/hr over 30 Minutes Intravenous Every 8 hours 02/01/18 0258 02/09/18 0559   01/31/18 2241  ceFEPIme (MAXIPIME) 1 g injection    Note to Pharmacy:  Shellee Milo   : cabinet override      01/31/18 2241 01/31/18 2245   01/31/18 2215  ceFEPIme (MAXIPIME) 1 g in sodium chloride 0.9 % 100 mL IVPB     1 g 200 mL/hr over 30 Minutes Intravenous  Once 01/31/18 2207 01/31/18 2321   01/31/18 2215  vancomycin (VANCOCIN) IVPB  1000 mg/200 mL premix     1,000 mg 200 mL/hr over 60 Minutes Intravenous  Once 01/31/18 2207 02/01/18 0174  OBJECTIVE: Blood pressure 119/73, pulse 93, temperature 98.9 F (37.2 C), temperature source Oral, resp. rate 20, height '5\' 7"'  (1.702 m), weight 79.4 kg (175 lb 0.7 oz), SpO2 91 %.  Physical Exam  Constitutional: He is oriented to person, place, and time. He appears well-developed and well-nourished.  HENT:  Head: Normocephalic and atraumatic.  Mouth/Throat: Oropharynx is clear and moist.  Eyes: Pupils are equal, round, and reactive to light. EOM are normal.  Neck: Normal range of motion. Neck supple.  Cardiovascular: Normal rate, regular rhythm and normal heart sounds.  Pulmonary/Chest: Effort normal. He has rhonchi.  Abdominal: Soft. Bowel sounds are normal. There is no tenderness.  Musculoskeletal: Normal range of motion.       Right lower leg: He exhibits no tenderness and no edema.       Left lower leg: He exhibits no tenderness and no edema.  Lymphadenopathy:    He has no cervical adenopathy.  Neurological: He is alert and oriented to person, place, and time.  Psychiatric: He has a normal mood and affect.    Lab Results Results for orders placed or performed during the hospital encounter of 01/31/18 (from the past 48 hour(s))  Basic metabolic panel     Status: Abnormal   Collection Time: 01/31/18  9:28 PM  Result Value Ref Range   Sodium 140 135 - 145 mmol/L   Potassium 3.7 3.5 - 5.1 mmol/L   Chloride 110 98 - 111 mmol/L    Comment: Please note change in reference range.   CO2 23 22 - 32 mmol/L   Glucose, Bld 78 70 - 99 mg/dL    Comment: Please note change in reference range.   BUN 9 8 - 23 mg/dL    Comment: Please note change in reference range.   Creatinine, Ser 0.90 0.61 - 1.24 mg/dL   Calcium 8.1 (L) 8.9 - 10.3 mg/dL   GFR calc non Af Amer >60 >60 mL/min   GFR calc Af Amer >60 >60 mL/min    Comment: (NOTE) The eGFR has been calculated using the  CKD EPI equation. This calculation has not been validated in all clinical situations. eGFR's persistently <60 mL/min signify possible Chronic Kidney Disease.    Anion gap 7 5 - 15    Comment: Performed at [facility], [address] [place], [place] [address]  CBC with Differential     Status: Abnormal   Collection Time: 01/31/18  9:28 PM  Result Value Ref Range   WBC 2.2 (L) 4.0 - 10.5 K/uL   RBC 2.98 (L) 4.22 - 5.81 MIL/uL   Hemoglobin 8.0 (L) 13.0 - 17.0 g/dL   HCT 26.6 (L) 39.0 - 52.0 %   MCV 89.3 78.0 - 100.0 fL   MCH 26.8 26.0 - 34.0 pg   MCHC 30.1 30.0 - 36.0 g/dL   RDW 19.0 (H) 11.5 - 15.5 %   Platelets 124 (L) 150 - 400 K/uL   Neutrophils Relative % 55 %   Neutro Abs 1.2 (L) 1.7 - 7.7 K/uL   Lymphocytes Relative 26 %   Lymphs Abs 0.6 (L) 0.7 - 4.0 K/uL   Monocytes Relative 17 %   Monocytes Absolute 0.4 0.1 - 1.0 K/uL   Eosinophils Relative 1 %   Eosinophils Absolute 0.0 0.0 - 0.7 K/uL   Basophils Relative 1 %   Basophils Absolute 0.0 0.0 - 0.1 K/uL    Comment: Performed at [facility], [address] [place],  [place] [address]  Troponin I     Status: None   Collection Time: 01/31/18  9:28 PM  Result Value Ref Range   Troponin I <0.03 <0.03 ng/mL    Comment: Performed at [facility], [address] [place], [place] [address]  D-dimer, quantitative     Status: None   Collection Time: 01/31/18  9:28 PM  Result Value Ref Range   D-Dimer, Quant 0.45 0.00 - 0.50 ug/mL-FEU    Comment: (NOTE) At the manufacturer cut-off of 0.50 ug/mL FEU, this assay has been documented to exclude PE with a sensitivity and negative predictive value of 97 to 99%.  At this time, this assay has not been approved by the FDA to exclude DVT/VTE. Results should be correlated with clinical presentation. Performed at [facility], [address] [place], [place] [address]   I-Stat CG4 Lactic Acid, ED     Status: None   Collection Time:  01/31/18 10:29 PM  Result Value Ref Range   Lactic Acid, Venous 1.03 0.5 - 1.9 mmol/L  Culture, blood (routine x 2) Call MD if unable to obtain prior to antibiotics being given     Status: None (Preliminary result)   Collection Time: 01/31/18 10:37 PM  Result Value Ref Range   Specimen Description      BLOOD LEFT ANTECUBITAL Performed at [facility], [address] [place], [place] [address]    Special Requests      BOTTLES DRAWN AEROBIC AND ANAEROBIC Blood Culture results may not be optimal due to an inadequate volume of blood received in culture bottles Performed at [facility], [address] [place], [place] [address]    Culture      NO GROWTH 1 DAY Performed at [facility], [place] [address], [place], [place] [address]    Report Status PENDING   Culture, blood (routine x 2) Call MD if unable to obtain prior to antibiotics being given     Status: None (Preliminary result)   Collection Time: 01/31/18 10:37 PM  Result Value Ref Range   Specimen Description      BLOOD RIGHT ANTECUBITAL Performed at [facility], [address] [place], [place] [address]    Special Requests      BOTTLES DRAWN AEROBIC AND ANAEROBIC Blood Culture results may not be optimal due to an inadequate volume of blood received in culture bottles Performed at [facility], [address] [place], [place] [address]    Culture      NO GROWTH 1 DAY Performed at [facility], [place] [address], [place], [place] [address]    Report Status PENDING   Basic metabolic panel     Status: Abnormal   Collection Time: 02/01/18  5:16 AM  Result Value Ref Range   Sodium 141 135 - 145 mmol/L   Potassium 4.0 3.5 - 5.1 mmol/L   Chloride 108 98 - 111 mmol/L    Comment: Please note change in reference range.   CO2 26 22 - 32 mmol/L   Glucose, Bld 96 70 - 99 mg/dL    Comment: Please note change in reference range.   BUN 8 8 - 23 mg/dL    Comment: Please note change  in reference range.   Creatinine, Ser 0.89 0.61 - 1.24 mg/dL   Calcium 8.2 (L) 8.9 - 10.3 mg/dL   GFR calc non Af Amer >60 >  60 mL/min   GFR calc Af Amer >60 >60 mL/min    Comment: (NOTE) The eGFR has been calculated using the CKD EPI equation. This calculation has not been validated in all clinical situations. eGFR's persistently <60 mL/min signify possible Chronic Kidney Disease.    Anion gap 7 5 - 15    Comment: Performed at [facility], [place] [address], [place], [place] [address]  CBC     Status: Abnormal   Collection Time: 02/01/18  5:16 AM  Result Value Ref Range   WBC 2.0 (L) 4.0 - 10.5 K/uL   RBC 3.13 (L) 4.22 - 5.81 MIL/uL   Hemoglobin 8.1 (L) 13.0 - 17.0 g/dL   HCT 27.9 (L) 39.0 - 52.0 %   MCV 89.1 78.0 - 100.0 fL   MCH 25.9 (L) 26.0 - 34.0 pg   MCHC 29.0 (L) 30.0 - 36.0 g/dL   RDW 19.2 (H) 11.5 - 15.5 %   Platelets 150 150 - 400 K/uL    Comment: Performed at [facility], [place] [address] [place], [place] [address]  Procalcitonin - Baseline     Status: None   Collection Time: 02/01/18  8:25 AM  Result Value Ref Range   Procalcitonin <0.10 ng/mL    Comment:        Interpretation: PCT (Procalcitonin) <= 0.5 ng/mL: Systemic infection (sepsis) is not likely. Local bacterial infection is possible. (NOTE)       Sepsis PCT Algorithm           Lower Respiratory Tract                                      Infection PCT Algorithm    ----------------------------     ----------------------------         PCT < 0.25 ng/mL                PCT < 0.10 ng/mL         Strongly encourage             Strongly discourage   discontinuation of antibiotics    initiation of antibiotics    ----------------------------     -----------------------------       PCT 0.25 - 0.50 ng/mL            PCT 0.10 - 0.25 ng/mL               OR       >80% decrease in PCT            Discourage initiation of                                            antibiotics       Encourage discontinuation           of antibiotics    ----------------------------     -----------------------------         PCT >= 0.50 ng/mL              PCT 0.26 - 0.50 ng/mL               AND        <80% decrease in PCT             Encourage initiation  of                                             antibiotics       Encourage continuation           of antibiotics    ----------------------------     -----------------------------        PCT >= 0.50 ng/mL                  PCT > 0.50 ng/mL               AND         increase in PCT                  Strongly encourage                                      initiation of antibiotics    Strongly encourage escalation           of antibiotics                                     -----------------------------                                           PCT <= 0.25 ng/mL                                                 OR                                        > 80% decrease in PCT                                     Discontinue / Do not initiate                                             antibiotics Performed at [facility] [address], [place], [place] [address]   Strep pneumoniae urinary antigen     Status: None   Collection Time: 02/01/18  5:04 PM  Result Value Ref Range   Strep Pneumo Urinary Antigen NEGATIVE NEGATIVE    Comment:        Infection due to S. pneumoniae cannot be absolutely ruled out since the antigen present may be below the detection limit of the test. PERFORMED AT [facility] Performed at [facility], [place] [address], [place], [place] [address]   Legionella Pneumophila Serogp 1 Ur Ag     Status: None   Collection Time: 02/01/18  5:04 PM  Result Value Ref Range   L.  pneumophila Serogp 1 Ur Ag Negative Negative    Comment: (NOTE) Presumptive negative for L. pneumophila serogroup 1 antigen in urine, suggesting no recent or current infection. Legionnaires' disease cannot be  ruled out since other serogroups and species may also cause disease. Performed At: [facility] [place], [place] [id number] Rush Farmer MD [id number]    Source of Sample URINE, CLEAN CATCH     Comment: Performed at [facility], [place] [address], [place], [place] [address]  Histoplasma antigen, urine     Status: None   Collection Time: 02/01/18  5:04 PM  Result Value Ref Range   Histoplasma Antigen, urine <0.5 <0.5 ng/mL   Disclaimer: Comment     Comment: (NOTE) This test was developed and its performance characteristics determined by [facility]. It has not been cleared or approved by the Food and Drug Administration. Performed At: [facility] [place], [place] [id number] Rush Farmer MD [id number]   CBC     Status: Abnormal   Collection Time: 02/02/18  5:27 AM  Result Value Ref Range   WBC 1.5 (L) 4.0 - 10.5 K/uL   RBC 3.09 (L) 4.22 - 5.81 MIL/uL   Hemoglobin 8.1 (L) 13.0 - 17.0 g/dL   HCT 27.2 (L) 39.0 - 52.0 %   MCV 88.0 78.0 - 100.0 fL   MCH 26.2 26.0 - 34.0 pg   MCHC 29.8 (L) 30.0 - 36.0 g/dL   RDW 19.0 (H) 11.5 - 15.5 %   Platelets 115 (L) 150 - 400 K/uL    Comment: REPEATED TO VERIFY SPECIMEN CHECKED FOR CLOTS PLATELET COUNT CONFIRMED BY SMEAR Performed at [facility], [place] [address], [place], [place] [address]   Basic metabolic panel     Status: Abnormal   Collection Time: 02/02/18  5:27 AM  Result Value Ref Range   Sodium 136 135 - 145 mmol/L   Potassium 3.9 3.5 - 5.1 mmol/L   Chloride 105 98 - 111 mmol/L   CO2 25 22 - 32 mmol/L   Glucose, Bld 114 (H) 70 - 99 mg/dL   BUN 8 8 - 23 mg/dL   Creatinine, Ser 0.78 0.61 - 1.24 mg/dL   Calcium 7.9 (L) 8.9 - 10.3 mg/dL   GFR calc non Af Amer >60 >60 mL/min   GFR calc Af Amer >60 >60 mL/min    Comment: (NOTE) The eGFR has been calculated using the CKD EPI equation. This calculation has not been validated in all clinical  situations. eGFR's persistently <60 mL/min signify possible Chronic Kidney Disease.    Anion gap 6 5 - 15    Comment: Performed at [facility], [place] [address], [place], [place] [address]  Procalcitonin     Status: None   Collection Time: 02/02/18  5:27 AM  Result Value Ref Range   Procalcitonin <0.10 ng/mL    Comment:        Interpretation: PCT (Procalcitonin) <= 0.5 ng/mL: Systemic infection (sepsis) is not likely. Local bacterial infection is possible. (NOTE)       Sepsis PCT Algorithm           Lower Respiratory Tract                                      Infection PCT Algorithm    ----------------------------     ----------------------------  PCT < 0.25 ng/mL                PCT < 0.10 ng/mL         Strongly encourage             Strongly discourage   discontinuation of antibiotics    initiation of antibiotics    ----------------------------     -----------------------------       PCT 0.25 - 0.50 ng/mL            PCT 0.10 - 0.25 ng/mL               OR       >80% decrease in PCT            Discourage initiation of                                            antibiotics      Encourage discontinuation           of antibiotics    ----------------------------     -----------------------------         PCT >= 0.50 ng/mL              PCT 0.26 - 0.50 ng/mL               AND        <80% decrease in PCT             Encourage initiation of                                             antibiotics       Encourage continuation           of antibiotics    ----------------------------     -----------------------------        PCT >= 0.50 ng/mL                  PCT > 0.50 ng/mL               AND         increase in PCT                  Strongly encourage                                      initiation of antibiotics    Strongly encourage escalation           of antibiotics                                     -----------------------------                                            PCT <= 0.25 ng/mL  OR                                        > 80% decrease in PCT                                     Discontinue / Do not initiate                                             antibiotics Performed at [facility] [address], [place], [place] [address]   Magnesium     Status: None   Collection Time: 02/02/18  5:27 AM  Result Value Ref Range   Magnesium 2.0 1.7 - 2.4 mg/dL    Comment: Performed at [facility], [place] [address] [place], [place] [address]  Differential     Status: Abnormal   Collection Time: 02/02/18  5:27 AM  Result Value Ref Range   Neutrophils Relative % 61 %   Lymphocytes Relative 20 %   Monocytes Relative 9 %   Eosinophils Relative 0 %   Basophils Relative 1 %   Band Neutrophils 9 %   Neutro Abs 1.1 (L) 1.7 - 7.7 K/uL   Lymphs Abs 0.3 (L) 0.7 - 4.0 K/uL   Monocytes Absolute 0.1 0.1 - 1.0 K/uL   Eosinophils Absolute 0.0 0.0 - 0.7 K/uL   Basophils Absolute 0.0 0.0 - 0.1 K/uL   WBC Morphology WHITE COUNT CONFIRMED ON SMEAR     Comment: Performed at [facility], [place] [address] [place], [place] [address]  Urinalysis, Routine w reflex microscopic     Status: Abnormal   Collection Time: 02/02/18  1:05 PM  Result Value Ref Range   Color, Urine YELLOW YELLOW   APPearance CLEAR CLEAR   Specific Gravity, Urine 1.014 1.005 - 1.030   pH 6.0 5.0 - 8.0   Glucose, UA NEGATIVE NEGATIVE mg/dL   Hgb urine dipstick SMALL (A) NEGATIVE   Bilirubin Urine NEGATIVE NEGATIVE   Ketones, ur NEGATIVE NEGATIVE mg/dL   Protein, ur NEGATIVE NEGATIVE mg/dL   Nitrite NEGATIVE NEGATIVE   Leukocytes, UA NEGATIVE NEGATIVE   RBC / HPF 6-10 0 - 5 RBC/hpf   WBC, UA 0-5 0 - 5 WBC/hpf   Bacteria, UA RARE (A) NONE SEEN   Squamous Epithelial / LPF 0-5 0 - 5   Mucus PRESENT     Comment: Performed at [facility], [place] [address],  [place], [place] [address]  Brain natriuretic peptide     Status: Abnormal   Collection Time: 02/02/18  1:16 PM  Result Value Ref Range   B Natriuretic Peptide 209.5 (H) 0.0 - 100.0 pg/mL    Comment: Performed at [facility], [place] [address], [place], [place] [address]      Component Value Date/Time   SDES  01/31/2018 2237    BLOOD LEFT ANTECUBITAL Performed at [facility], [address] [place] Bhat [place], [place] [address]    SDES  01/31/2018 2237    BLOOD RIGHT ANTECUBITAL Performed at [facility], [address] [place],  [address]    [facility]  01/31/2018 2237  BOTTLES DRAWN AEROBIC AND ANAEROBIC Blood Culture results may not be optimal due to an inadequate volume of blood received in culture bottles Performed at [facility], [address] [place], [place] [address]    [facility]  01/31/2018 2237    BOTTLES DRAWN AEROBIC AND ANAEROBIC Blood Culture results may not be optimal due to an inadequate volume of blood received in culture bottles Performed at [facility], [address], [place], [place] [address]    CULT  01/31/2018 2237    NO GROWTH 1 DAY Performed at [facility], [place] [address] [place], [place] [address]    CULT  01/31/2018 2237    NO GROWTH 1 DAY Performed at [facility] [address] [place], [place] [address]    REPTSTATUS PENDING 01/31/2018 2237   REPTSTATUS PENDING 01/31/2018 2237   Dg Chest 2 View  Result Date: 02/02/2018 CLINICAL DATA:  Follow-up pneumonia. Shortness of breath. Coughing. EXAM: CHEST - 2 VIEW COMPARISON:  01/31/2018. FINDINGS: Mediastinum hilar structures are normal. Heart size normal. No evidence of infiltrate right upper lobe on today's exam. Persistent right base subsegmental atelectasis. Small right pleural effusion. Elevation right hemidiaphragm. Mild left base subsegmental atelectasis/infiltrate. No pneumothorax. Sliding hiatal hernia. Multiple  thoracic and lumbar spine vertebroplasties. IMPRESSION: 1. No evidence of infiltrate right upper lung noted on today's exam. Right lung base subsegmental atelectasis and small right pleural effusion noted. Left lung base subsegmental atelectasis/infiltrate noted on today's exam. 2.  Prominent sliding hiatal hernia. Electronically Signed   By: Marcello Moores  Register   On: 02/02/2018 14:55   Dg Chest 2 View  Result Date: 01/31/2018 CLINICAL DATA:  Increased respiratory effort for 6 days. Fever. Increased cough and congestion. Previous smoker. History of multiple myeloma. EXAM: CHEST - 2 VIEW COMPARISON:  11/10/2017 FINDINGS: Heart size and pulmonary vascularity are normal. Increased opacity in the right base similar to previous study. This could reflect elevation of the hemidiaphragm or sub pulmonic effusion. Linear atelectasis in the lung bases. Since the previous study, there is development of mild perihilar infiltration in the right upper lung. This could reflect superimposed pneumonia. No blunting of costophrenic angles. No pneumothorax. Mediastinal contours appear intact. Old bilateral rib fractures. Multiple thoracic and lumbar spine compression deformities with kyphoplasty changes. Bone changes are consistent with history of myeloma. IMPRESSION: Persistent elevation of the right hemidiaphragm versus subpulmonic effusion. Probable developing infiltrate in the right upper lung. Bone changes consistent with history of myeloma. Electronically Signed   By: Lucienne Capers M.D.   On: 01/31/2018 21:53   Recent Results (from the past 240 hour(s))  Culture, blood (routine x 2) Call MD if unable to obtain prior to antibiotics being given     Status: None (Preliminary result)   Collection Time: 01/31/18 10:37 PM  Result Value Ref Range Status   Specimen Description   Final    BLOOD LEFT ANTECUBITAL Performed at [facility], [address] [place], [place] [address]    Special Requests   Final     BOTTLES DRAWN AEROBIC AND ANAEROBIC Blood Culture results may not be optimal due to an inadequate volume of blood received in culture bottles Performed at [facility] [address], [place], [place] [address]    Culture   Final    NO GROWTH 1 DAY Performed at [facility], [place] [address] [place], [place] [address]    Report Status PENDING  Incomplete  Culture, blood (routine x 2) Call MD if unable to obtain prior to antibiotics being given     Status: None (Preliminary result)   Collection Time: 01/31/18 10:37 PM  Result Value Ref Range Status   Specimen Description   Final    BLOOD RIGHT ANTECUBITAL Performed at [facility], [address] [place], [place] [address]    Special Requests   Final    BOTTLES DRAWN AEROBIC AND ANAEROBIC Blood Culture results may not be optimal due to an inadequate volume of blood received in culture bottles Performed at [facility], [address], [place], [place] [address]    Culture   Final    NO GROWTH 1 DAY Performed at [facility], [place] [address] [place], [place] [address]    Report Status PENDING  Incomplete    Microbiology: Recent Results (from the past 240 hour(s))  Culture, blood (routine x 2) Call MD if unable to obtain prior to antibiotics being given     Status: None (Preliminary result)   Collection Time: 01/31/18 10:37 PM  Result Value Ref Range Status   Specimen Description   Final    BLOOD LEFT ANTECUBITAL Performed at [facility], [address] [place], [place] [address]    Special Requests   Final    BOTTLES DRAWN AEROBIC AND ANAEROBIC Blood Culture results may not be optimal due to an inadequate volume of blood received in culture bottles Performed at [facility], [address] [place], [place] [address]    Culture   Final    NO GROWTH 1 DAY Performed at [facility], [place] [address] [place], [place] [address]    Report Status PENDING   Incomplete  Culture, blood (routine x 2) Call MD if unable to obtain prior to antibiotics being given     Status: None (Preliminary result)   Collection Time: 01/31/18 10:37 PM  Result Value Ref Range Status   Specimen Description   Final    BLOOD RIGHT ANTECUBITAL Performed at [facility], [address] [place], [place] [address]    Special Requests   Final    BOTTLES DRAWN AEROBIC AND ANAEROBIC Blood Culture results may not be optimal due to an inadequate volume of blood received in culture bottles Performed at [facility], [address] [place], [place] [address]    Culture   Final    NO GROWTH 1 DAY Performed at [facility], [place] [address] [place], [place] [address]    Report Status PENDING  Incomplete    Radiographs and labs were personally reviewed by me.   Bobby Rumpf, MD [facility] [phone] 02/02/2018, 6:24 PM

## 2018-02-02 NOTE — Progress Notes (Signed)
PROGRESS NOTE    Jorge [name]  MRN:[id number] DOB: 06/05/1956 DOA: 01/31/2018 PCP: Saguier, Edward, PA-C    Brief Narrative:  61-year-old with past medical history relevant for multiple myeloma status post bone marrow transplant with residual disease on lenalidomide, history of DVT on rivaroxaban who was admitted for cough and shortness of breath and found to have pneumonia.     Assessment & Plan:   Principal Problem:   Neutropenia with fever (HCC) Active Problems:   HCAP (healthcare-associated pneumonia)   Multiple myeloma (HCC)   Acute deep vein thrombosis (DVT) of femoral vein of left lower extremity (HCC)   Iron deficiency anemia due to chronic blood loss   Pancytopenia (HCC)  1 neutropenic fever Patient had presented with fevers, cough shortness of breath chest x-ray worrisome for pneumonia in the setting of an immunocompromised state.  Patient noted to have recent travel to [place] raising concern for endemic fungi such as called to the mycosis.  Patient on empiric IV vancomycin and cefepime however still spiking fevers with temps as high as 102.6.  Blood cultures have been ordered and are pending.  Repeat UA with cultures and sensitivities.  Repeat chest x-ray.   Urine histoplasmosis antigen has been ordered and is pending.  Urine Legionella antigen and urine pneumococcus antigen are negative.Continue empiric IV vancomycin and IV cefepime.  If patient continues to spike fevers may need to add antifungal regimen.  Consult with ID for further evaluation and management.  2.  Healthcare associated pneumonia Patient had presented with respiratory symptoms of cough and shortness of breath.  Chest x-ray concerning for pneumonia.  Patient visibly short of breath this morning with a cough.  Blood cultures pending with no growth to date.  Urine Legionella antigen negative.  Urine pneumococcus antigen negative.  Repeat a chest x-ray.  Place on Mucinex 1200 mg twice daily.  Place on scheduled  albuterol nebs.  We will give a dose of IV Lasix.  Continue empiric IV vancomycin IV cefepime.  3.  Shortness of breath Likely secondary to problem #2.  Repeat chest x-ray.  Patient with some bibasilar crackles.  Check a BNP.  We will give Lasix 40 mg IV every 12 hours x4 doses.  Strict I's and O's.  Daily weights.  Patient with recent 2D echo done 11/11/2017 with an EF of 55 to 60%, no wall motion abnormalities, mild aortic valvular regurgitation.  Mild mitral valvular regurgitation.  4.  Pancytopenia Likely secondary to bone marrow transplant and lenalidomide.  WBC trending down currently at 1.5.  Added differential.  If ANC becomes less than 1 may need to place on granulocyte stimulating factor.  Will inform patient's oncologist of patient's admission via epic.  Follow closely.  5.  IgG kappa multiple myeloma status post bone marrow transplant with residual disease Continue famciclovir.  Lenalidomide on hold.  Will inform oncology via epic of patient's admission.  6.  History of DVT Continue rivaroxaban.  7.  Depression Stable.  Continue home regimen sertraline.   DVT prophylaxis: Xarelto Code Status: Full Family Communication: Updated patient and wife at bedside. Disposition Plan: Likely home once clinically improved and afebrile.   Consultants:   ID pending  Procedures:   Chest x-ray 01/31/2018, 02/02/2018    Antimicrobials:  IV cefepime 01/31/2018>>>> 02/09/2018  IV vancomycin 01/31/2018   Subjective: Patient noted to have ongoing fevers with a T-max of 102.6 last night.  Patient visibly short of breath.  Patient denies any chest pain.  Patient states no significant improvement   since admission.  Objective: Vitals:   02/02/18 0646 02/02/18 0750 02/02/18 1247 02/02/18 1456  BP:    119/73  Pulse:    93  Resp:    20  Temp: 98.9 F (37.2 C)   98.9 F (37.2 C)  TempSrc: Oral   Oral  SpO2:  92% 91%   Weight:      Height:        Intake/Output Summary (Last 24 hours)  at 02/02/2018 1703 Last data filed at 02/02/2018 1626 Gross per 24 hour  Intake 1785.8 ml  Output 2250 ml  Net -464.2 ml   Filed Weights   01/31/18 2107 02/01/18 0100  Weight: 79.4 kg (175 lb) 79.4 kg (175 lb 0.7 oz)    Examination:  General exam: Patient visibly short of breath. Respiratory system: Bibasilar crackles, poor to fair air movement, expiratory wheezing.  Visibly short of breath.  Speaking in full sentences.   Cardiovascular system: S1 & S2 heard, RRR. No JVD, murmurs, rubs, gallops or clicks.  Trace lower extremity edema.   Gastrointestinal system: Abdomen is soft, nontender, nondistended, positive bowel sounds.  No rebound.  No guarding.  No hepatosplenomegaly.  Central nervous system: Alert and oriented. No focal neurological deficits. Extremities: Symmetric 5 x 5 power. Skin: No rashes, lesions or ulcers Psychiatry: Judgement and insight appear normal. Mood & affect appropriate.     Data Reviewed: I have personally reviewed following labs and imaging studies  CBC: Recent Labs  Lab 01/31/18 2128 02/01/18 0516 02/02/18 0527  WBC 2.2* 2.0* 1.5*  NEUTROABS 1.2*  --  1.1*  HGB 8.0* 8.1* 8.1*  HCT 26.6* 27.9* 27.2*  MCV 89.3 89.1 88.0  PLT 124* 150 767*   Basic Metabolic Panel: Recent Labs  Lab 01/31/18 2128 02/01/18 0516 02/02/18 0527  NA 140 141 136  K 3.7 4.0 3.9  CL 110 108 105  CO2 _0 GLUCOSE 78 96 114*  BUN _1 CREATININE 0.90 0.89 0.78  CALCIUM 8.1* 8.2* 7.9*  MG  --   --  2.0   GFR: Estimated Creatinine Clearance: 97.9 mL/min (by C-G formula based on SCr of 0.78 mg/dL). Liver Function Tests: No results for input(s): AST, ALT, ALKPHOS, BILITOT, PROT, ALBUMIN in the last 168 hours. No results for input(s): LIPASE, AMYLASE in the last 168 hours. No results for input(s): AMMONIA in the last 168 hours. Coagulation Profile: No results for input(s): INR, PROTIME in the last 168 hours. Cardiac Enzymes: Recent Labs  Lab 01/31/18 2128   TROPONINI <0.03   BNP (last 3 results) No results for input(s): PROBNP in the last 8760 hours. HbA1C: No results for input(s): HGBA1C in the last 72 hours. CBG: No results for input(s): GLUCAP in the last 168 hours. Lipid Profile: No results for input(s): CHOL, HDL, LDLCALC, TRIG, CHOLHDL, LDLDIRECT in the last 72 hours. Thyroid Function Tests: No results for input(s): TSH, T4TOTAL, FREET4, T3FREE, THYROIDAB in the last 72 hours. Anemia Panel: No results for input(s): VITAMINB12, FOLATE, FERRITIN, TIBC, IRON, RETICCTPCT in the last 72 hours. Sepsis Labs: Recent Labs  Lab 01/31/18 2229 02/01/18 0825 02/02/18 0527  PROCALCITON  --  <0.10 <0.10  LATICACIDVEN 1.03  --   --     Recent Results (from the past 240 hour(s))  Culture, blood (routine x 2) Call MD if unable to obtain prior to antibiotics being given     Status: None (Preliminary result)   Collection Time: 01/31/18 10:37 PM  Result Value Ref  Range Status   Specimen Description   Final    BLOOD LEFT ANTECUBITAL Performed at [facility] [place], [address] [place], [place] [address]    Special Requests   Final    BOTTLES DRAWN AEROBIC AND ANAEROBIC Blood Culture results may not be optimal due to an inadequate volume of blood received in culture bottles Performed at [facility] [place], [address] [place], [place] [address]    Culture   Final    NO GROWTH 1 DAY Performed at [facility], [address] [place], [place] [address]    Report Status PENDING  Incomplete  Culture, blood (routine x 2) Call MD if unable to obtain prior to antibiotics being given     Status: None (Preliminary result)   Collection Time: 01/31/18 10:37 PM  Result Value Ref Range Status   Specimen Description   Final    BLOOD RIGHT ANTECUBITAL Performed at [facility] [place], [address] [place], [place] [address]    Special Requests   Final    BOTTLES DRAWN AEROBIC AND ANAEROBIC Blood Culture results may not  be optimal due to an inadequate volume of blood received in culture bottles Performed at [facility] [place], [address] [place], [place] [address]    Culture   Final    NO GROWTH 1 DAY Performed at [facility], [address] [place], [place] [address]    Report Status PENDING  Incomplete         Radiology Studies: Dg Chest 2 View  Result Date: 02/02/2018 CLINICAL DATA:  Follow-up pneumonia. Shortness of breath. Coughing. EXAM: CHEST - 2 VIEW COMPARISON:  01/31/2018. FINDINGS: Mediastinum hilar structures are normal. Heart size normal. No evidence of infiltrate right upper lobe on today's exam. Persistent right base subsegmental atelectasis. Small right pleural effusion. Elevation right hemidiaphragm. Mild left base subsegmental atelectasis/infiltrate. No pneumothorax. Sliding hiatal hernia. Multiple thoracic and lumbar spine vertebroplasties. IMPRESSION: 1. No evidence of infiltrate right upper lung noted on today's exam. Right lung base subsegmental atelectasis and small right pleural effusion noted. Left lung base subsegmental atelectasis/infiltrate noted on today's exam. 2.  Prominent sliding hiatal hernia. Electronically Signed   By: Thomas  Register   On: 02/02/2018 14:55   Dg Chest 2 View  Result Date: 01/31/2018 CLINICAL DATA:  Increased respiratory effort for 6 days. Fever. Increased cough and congestion. Previous smoker. History of multiple myeloma. EXAM: CHEST - 2 VIEW COMPARISON:  11/10/2017 FINDINGS: Heart size and pulmonary vascularity are normal. Increased opacity in the right base similar to previous study. This could reflect elevation of the hemidiaphragm or sub pulmonic effusion. Linear atelectasis in the lung bases. Since the previous study, there is development of mild perihilar infiltration in the right upper lung. This could reflect superimposed pneumonia. No blunting of costophrenic angles. No pneumothorax. Mediastinal contours appear intact. Old  bilateral rib fractures. Multiple thoracic and lumbar spine compression deformities with kyphoplasty changes. Bone changes are consistent with history of myeloma. IMPRESSION: Persistent elevation of the right hemidiaphragm versus subpulmonic effusion. Probable developing infiltrate in the right upper lung. Bone changes consistent with history of myeloma. Electronically Signed   By: William  Stevens M.D.   On: 01/31/2018 21:53        Scheduled Meds: . albuterol  2.5 mg Nebulization Q6H  . famciclovir  500 mg Oral Daily  . furosemide  40 mg Intravenous Q12H  . guaiFENesin  1,200 mg   Oral BID  . magnesium oxide  400 mg Oral Daily  . rivaroxaban  10 mg Oral QPM  . sertraline  100 mg Oral QHS   Continuous Infusions: . ceFEPime (MAXIPIME) IV Stopped (02/02/18 1415)  . vancomycin Stopped (02/02/18 1206)     LOS: 2 days    Time spent: 35 minutes    Irine Seal, MD [facility] Pager [phone]  If 7PM-7AM, please contact night-coverage [email or web address] Password [facility] 02/02/2018, 5:03 PM

## 2018-02-03 ENCOUNTER — Encounter: Payer: Self-pay | Admitting: *Deleted

## 2018-02-03 DIAGNOSIS — F329 Major depressive disorder, single episode, unspecified: Secondary | ICD-10-CM

## 2018-02-03 DIAGNOSIS — K219 Gastro-esophageal reflux disease without esophagitis: Secondary | ICD-10-CM

## 2018-02-03 DIAGNOSIS — Z923 Personal history of irradiation: Secondary | ICD-10-CM

## 2018-02-03 DIAGNOSIS — K449 Diaphragmatic hernia without obstruction or gangrene: Secondary | ICD-10-CM

## 2018-02-03 DIAGNOSIS — G893 Neoplasm related pain (acute) (chronic): Secondary | ICD-10-CM

## 2018-02-03 DIAGNOSIS — Z9484 Stem cells transplant status: Secondary | ICD-10-CM

## 2018-02-03 DIAGNOSIS — F419 Anxiety disorder, unspecified: Secondary | ICD-10-CM

## 2018-02-03 LAB — CBC WITH DIFFERENTIAL/PLATELET
BASOS ABS: 0 10*3/uL (ref 0.0–0.1)
Basophils Relative: 0 %
Eosinophils Absolute: 0 10*3/uL (ref 0.0–0.7)
Eosinophils Relative: 1 %
HCT: 29.2 % — ABNORMAL LOW (ref 39.0–52.0)
Hemoglobin: 8.7 g/dL — ABNORMAL LOW (ref 13.0–17.0)
Lymphocytes Relative: 34 %
Lymphs Abs: 0.5 10*3/uL — ABNORMAL LOW (ref 0.7–4.0)
MCH: 26 pg (ref 26.0–34.0)
MCHC: 29.8 g/dL — ABNORMAL LOW (ref 30.0–36.0)
MCV: 87.2 fL (ref 78.0–100.0)
Monocytes Absolute: 0.2 10*3/uL (ref 0.1–1.0)
Monocytes Relative: 15 %
Neutro Abs: 0.7 10*3/uL — ABNORMAL LOW (ref 1.7–7.7)
Neutrophils Relative %: 50 %
Platelets: 124 10*3/uL — ABNORMAL LOW (ref 150–400)
RBC: 3.35 MIL/uL — AB (ref 4.22–5.81)
RDW: 18.6 % — ABNORMAL HIGH (ref 11.5–15.5)
WBC: 1.4 10*3/uL — CL (ref 4.0–10.5)

## 2018-02-03 LAB — RESPIRATORY PANEL BY PCR
Adenovirus: NOT DETECTED
BORDETELLA PERTUSSIS-RVPCR: NOT DETECTED
CORONAVIRUS 229E-RVPPCR: NOT DETECTED
CORONAVIRUS OC43-RVPPCR: NOT DETECTED
Chlamydophila pneumoniae: NOT DETECTED
Coronavirus HKU1: NOT DETECTED
Coronavirus NL63: NOT DETECTED
Influenza A: NOT DETECTED
Influenza B: NOT DETECTED
METAPNEUMOVIRUS-RVPPCR: DETECTED — AB
MYCOPLASMA PNEUMONIAE-RVPPCR: NOT DETECTED
PARAINFLUENZA VIRUS 1-RVPPCR: NOT DETECTED
Parainfluenza Virus 2: NOT DETECTED
Parainfluenza Virus 3: NOT DETECTED
Parainfluenza Virus 4: NOT DETECTED
Respiratory Syncytial Virus: NOT DETECTED
Rhinovirus / Enterovirus: NOT DETECTED

## 2018-02-03 LAB — BASIC METABOLIC PANEL
Anion gap: 10 (ref 5–15)
BUN: 9 mg/dL (ref 8–23)
CHLORIDE: 100 mmol/L (ref 98–111)
CO2: 26 mmol/L (ref 22–32)
Calcium: 7.9 mg/dL — ABNORMAL LOW (ref 8.9–10.3)
Creatinine, Ser: 0.78 mg/dL (ref 0.61–1.24)
GFR calc non Af Amer: >60 mL/min (ref >60)
Glucose, Bld: 113 mg/dL — ABNORMAL HIGH (ref 70–99)
Potassium: 3.4 mmol/L — ABNORMAL LOW (ref 3.5–5.1)
Sodium: 136 mmol/L (ref 135–145)

## 2018-02-03 LAB — BRAIN NATRIURETIC PEPTIDE: B NATRIURETIC PEPTIDE 5: 99.6 pg/mL (ref 0.0–100.0)

## 2018-02-03 LAB — URINE CULTURE: Culture: NO GROWTH

## 2018-02-03 LAB — MAGNESIUM: MAGNESIUM: 2 mg/dL (ref 1.7–2.4)

## 2018-02-03 LAB — VANCOMYCIN, PEAK: VANCOMYCIN PK: 40 ug/mL (ref 30–40)

## 2018-02-03 LAB — VANCOMYCIN, TROUGH: Vancomycin Tr: 10 ug/mL — ABNORMAL LOW (ref 15–20)

## 2018-02-03 LAB — PROCALCITONIN: Procalcitonin: <0.1 ng/mL

## 2018-02-03 MED ORDER — ALBUTEROL SULFATE (2.5 MG/3ML) 0.083% IN NEBU
2.5000 mg | INHALATION_SOLUTION | Freq: Three times a day (TID) | RESPIRATORY_TRACT | Status: DC
  Administered 2018-02-03 – 2018-02-04 (×3): 2.5 mg via RESPIRATORY_TRACT
  Filled 2018-02-03 (×3): qty 3

## 2018-02-03 MED ORDER — POTASSIUM CHLORIDE CRYS ER 20 MEQ PO TBCR
40.0000 meq | EXTENDED_RELEASE_TABLET | Freq: Once | ORAL | Status: AC
  Administered 2018-02-03: 40 meq via ORAL
  Filled 2018-02-03: qty 2

## 2018-02-03 MED ORDER — TBO-FILGRASTIM 480 MCG/0.8ML ~~LOC~~ SOSY
480.0000 ug | PREFILLED_SYRINGE | Freq: Every day | SUBCUTANEOUS | Status: DC
  Administered 2018-02-03: 480 ug via SUBCUTANEOUS
  Filled 2018-02-03: qty 0.8

## 2018-02-03 NOTE — Progress Notes (Signed)
Pharmacy Antibiotic Note  Jorge [name] is a 62 y.o. male admitted on 01/31/2018 with  history of multiple myeloma who presents with shortness of breath.  He states he has had a cough for about the last week. Pharmacy has been consulted for vancomycini for pneumonia.  Plan: D3 full abx, for Vanc peak/trough today Vancomycin 1gm IV x 1 in ED then 1gm Q12h (AUC 543.5, Scr 0.9) Cefepime 1gm IV q8h per MD  ID consult: Fungal serology - if cont fever add Fluconazole CXR clear, possible mycosis/organ involvement (bone marrow), but counts stable, consider BMB if fever returns - ANC decreased further to 700 today, WBC 1.4, Filgrastim added per Oncology  Height: '5\' 7"'  (170.2 cm) Weight: 172 lb 14.4 oz (78.4 kg) IBW/kg (Calculated) : 66.1  Temp (24hrs), Avg:99.1 F (37.3 C), Min:98.1 F (36.7 C), Max:100.3 F (37.9 C)  Recent Labs  Lab 01/31/18 2128 01/31/18 2229 02/01/18 0516 02/02/18 0527 02/03/18 0542  WBC 2.2*  --  2.0* 1.5* 1.4*  CREATININE 0.90  --  0.89 0.78 0.78  LATICACIDVEN  --  1.03  --   --   --     Estimated Creatinine Clearance: 90.7 mL/min (by C-G formula based on SCr of 0.78 mg/dL).    No Known Allergies  Antimicrobials this admission: 7/21 cefepime >> 7/22 vanc >> PTA Famvir resumed  Dose adjustments this admission: V peak 1430 = ____ V trough 2330 = ____  Microbiology results: 7/21 BCx x2: ngtd         Sputum ordered: not collected 7/22 ur strep: neg 7/22 ur legionella: neg 7/22 Histoplasma Ag: neg 7/23 UCx: ng-final 7/24 Fungal Ab panel: in process 7/24 IgG, IgA, IgM: in process  Thank you for allowing pharmacy to be a part of this patient's care.  Minda Ditto PharmD Pager [phone] 02/03/2018, 1:05 PM

## 2018-02-03 NOTE — Progress Notes (Signed)
Taking over care of patient. Agree with previous RN assessment. Denies any needs at this time. Will continue to monitor.  

## 2018-02-03 NOTE — Progress Notes (Signed)
CRITICAL VALUE STICKER  CRITICAL VALUE: WBC 1.4  RECEIVER (on-site recipient of call): [facility]   DATE & TIME NOTIFIED: 02/03/18 3612  MESSENGER (representative from lab): N.MCCOY  MD NOTIFIED: K.Schorr via [place]: [id number]  RESPONSE: Acknowledged. 0.1 change in yesterday's value.

## 2018-02-03 NOTE — Progress Notes (Signed)
MD ordered respiratory panel for patient. Explained to patient that he would be placed on droplet precautions while the test was in process.  Also placed patient on neutropenic precautions d/t his low WBC count.  Patient understood both precautions and didn't have any questions for RN.

## 2018-02-03 NOTE — Progress Notes (Signed)
SATURATION QUALIFICATIONS: (This note is used to comply with regulatory documentation for home oxygen)  Patient Saturations on Room Air at Rest = 95-97%  Patient Saturations on Room Air while Ambulating = 92-95%  Patient Saturations on 0 Liters of oxygen while Ambulating = see above  Please briefly explain why patient needs home oxygen:  At this current time, patient's oxygen saturation while ambulating entire length of hall on RA remained between 92-95%. Will continue to monitor oxygen saturation while ambulating and with any change in patient status.

## 2018-02-03 NOTE — Consult Note (Signed)
Referral MD  Reason for Referral: IgG  myeloma; right upper lobe pneumonia  Chief Complaint  Patient presents with  . Shortness of Breath  : I got sick in [place]  HPI: [name] is well-known to me.  He is a 63 year old white male.  He has a history of IgG myeloma.  He underwent a stem cell transplant at [facility] in February 2018.  He has done very well.  He is on maintenance Revlimid.  His last myeloma levels back in June showed an M spike of 0.1 g/dL.  He has had pneumonia before.  He did not have low immunoglobulin levels.  He was in [place].  He was visiting family.  He actually was involved with a myeloma seminar.  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  He began to have some shortness of breath.  This worsened.  He got to the point where he really cannot do much.  He came back to [place].  He went to the emergency room.  Chest x-ray showed probable infiltrate in the right upper lung.  He did not have any sputum.  He had no bleeding.  He is on Revlimid.  When he was admitted, his white cell count was on the low side.  His white cell count was 2.2.  Hemoglobin 8.  Platelet count 124,000.  His white cell count has dropped.  Today, his white cell count is 1.4.  Hemoglobin 8.7.  Platelet count 124,000.  Cultures are negative to date.     Past Medical History:  Diagnosis Date  . Anxiety   . Bone metastasis ([place])   . Chest cold 05/19/2016   productive cough  -- started on antibiotic  . Chronic back pain    due to bone mets from myeloma  . Cough   . Depression   . GERD (gastroesophageal reflux disease)   . Hiatal hernia   . History of chicken pox   . History of concussion    age 56 -- no residual  . History of DVT of lower extremity 03/21/2016  treated and completed w/ xarelto   per doppler left extensive occlusion common femoral, femoral, and popliteal veins and right partial occlusion common femoral and profunda femoral veins/  last doppler 06-04-2016 no  evidence acute or chronic dvt noted either leg   . History of radiation therapy 06/09/16-06/23/16   lower thoracic spine 25 Gy in 10 fractions  . Mouth ulcers    secondary to radiation  . Multiple myeloma ([place]) dx 02/22/2016 via bone marrow bx---  oncologist-  [name]   IgG Kappa-- Hyperdiploid/ +11 w/ bone mets--  current treatment chemotherapy (started 08/ 2017)and pallitive radiation to back started 06-09-2016  . Renal calculus, right   . Wears contact lenses   :  Past Surgical History:  Procedure Laterality Date  . COLONOSCOPY  [id number]  . CYSTOSCOPY W/ URETERAL STENT PLACEMENT Right 06/20/2016   Procedure: CYSTOSCOPY WITH STENT REPLACEMENT;  Surgeon: Kathie Rhodes, MD;  Location: [facility];  Service: Urology;  Laterality: Right;  . CYSTOSCOPY WITH RETROGRADE PYELOGRAM, URETEROSCOPY AND STENT PLACEMENT Right 05/30/2016   Procedure: CYSTOSCOPY WITH RETROGRADE PYELOGRAM, URETEROSCOPY AND STENT PLACEMENT,DILITATION URETERAL STRICTURE;  Surgeon: Kathie Rhodes, MD;  Location: [facility];  Service: Urology;  Laterality: Right;  . CYSTOSCOPY/RETROGRADE/URETEROSCOPY/STONE EXTRACTION WITH BASKET Right 06/20/2016   Procedure: CYSTOSCOPY/URETEROSCOPY/STONE EXTRACTION WITH BASKET;  Surgeon: Kathie Rhodes, MD;  Location: [facility];  Service: Urology;  Laterality: Right;  .  HOLMIUM LASER APPLICATION Right 62/03/[birth year]   Procedure: HOLMIUM LASER APPLICATION;  Surgeon: Kathie Rhodes, MD;  Location: [facility];  Service: Urology;  Laterality: Right;  . IR GENERIC HISTORICAL  02/11/2016   IR RADIOLOGIST EVAL & MGMT 02/11/2016 [facility]  . IR GENERIC HISTORICAL  02/15/2016   IR BONE TUMOR(S)RF ABLATION 02/15/2016 Luanne Bras, MD [facility]  . IR GENERIC HISTORICAL  02/15/2016   IR BONE TUMOR(S)RF ABLATION 02/15/2016 Luanne Bras, MD [facility]  . IR GENERIC HISTORICAL  02/15/2016   IR BONE TUMOR(S)RF ABLATION 02/15/2016 Luanne Bras, MD [facility]   . IR GENERIC HISTORICAL  02/15/2016   IR KYPHO THORACIC WITH BONE BIOPSY 02/15/2016 Luanne Bras, MD [facility]  . IR GENERIC HISTORICAL  02/15/2016   IR KYPHO THORACIC WITH BONE BIOPSY 02/15/2016 Luanne Bras, MD [facility]  . IR GENERIC HISTORICAL  02/15/2016   IR VERTEBROPLASTY CERV/THOR BX INC UNI/BIL INC/INJECT/IMAGING 02/15/2016 Luanne Bras, MD [facility]  . IR GENERIC HISTORICAL  03/13/2016   IR KYPHO EA ADDL LEVEL THORACIC OR LUMBAR 03/13/2016 Luanne Bras, MD [facility]  . IR GENERIC HISTORICAL  03/13/2016   IR KYPHO EA ADDL LEVEL THORACIC OR LUMBAR 03/13/2016 Luanne Bras, MD [facility]  . IR GENERIC HISTORICAL  03/13/2016   IR BONE TUMOR(S)RF ABLATION 03/13/2016 Luanne Bras, MD [facility]  . IR GENERIC HISTORICAL  03/13/2016   IR KYPHO LUMBAR INC FX REDUCE BONE BX UNI/BIL CANNULATION INC/IMAGING 03/13/2016 Luanne Bras, MD [facility]  . IR GENERIC HISTORICAL  03/13/2016   IR BONE TUMOR(S)RF ABLATION 03/13/2016 Luanne Bras, MD [facility]  . IR GENERIC HISTORICAL  03/13/2016   IR BONE TUMOR(S)RF ABLATION 03/13/2016 Luanne Bras, MD [facility]  . IR GENERIC HISTORICAL  03/31/2016   IR RADIOLOGIST EVAL & MGMT 03/31/2016 [facility]  . LAPAROSCOPIC INGUINAL HERNIA REPAIR Bilateral 12-16-2013  dr gross  . RADIOLOGY WITH ANESTHESIA N/A 02/15/2016   Procedure: Spinal Ablation;  Surgeon: Luanne Bras, MD;  Location: [facility];  Service: Radiology;  Laterality: N/A;  . RADIOLOGY WITH ANESTHESIA N/A 03/13/2016   Procedure: LUMBER ABLATION;  Surgeon: Luanne Bras, MD;  Location: [place];  Service: Radiology;  Laterality: N/A;  . ROTATOR CUFF REPAIR Right 2003  . TONSILLECTOMY  age 63  . WISDOM TOOTH EXTRACTION    :   Current Facility-Administered Medications:  .  acetaminophen (TYLENOL) tablet 650 mg, 650 mg, Oral, Q6H PRN, 650 mg at 02/02/18 1946 **OR** acetaminophen (TYLENOL) suppository 650 mg, 650 mg, Rectal, Q6H PRN, Rise Patience, MD .  albuterol (PROVENTIL) (2.5 MG/3ML) 0.083% nebulizer solution 2.5 mg, 2.5 mg, Nebulization, Q6H, Purohit, Shrey C, MD, 2.5 mg at 02/03/18 0132 .  albuterol (PROVENTIL) (2.5 MG/3ML) 0.083% nebulizer solution 3 mL, 3 mL, Inhalation, Q2H PRN, Eugenie Filler, MD, 3 mL at 02/02/18 1704 .  benzonatate (TESSALON) capsule 200 mg, 200 mg, Oral, TID PRN, Rise Patience, MD, 200 mg at 02/01/18 2205 .  ceFEPIme (MAXIPIME) 1 g in sodium chloride 0.9 % 100 mL IVPB, 1 g, Intravenous, Q8H, Rise Patience, MD, Last Rate: 200 mL/hr at 02/03/18 0517, 1 g at 02/03/18 0517 .  famciclovir [facility]) tablet 500 mg, 500 mg, Oral, Daily, Rise Patience, MD, 500 mg at 02/02/18 0914 .  furosemide (LASIX) injection 40 mg, 40 mg, Intravenous, Q12H, Eugenie Filler, MD, 40 mg at 02/03/18 0059 .  guaiFENesin (MUCINEX) 12 hr tablet 1,200 mg, 1,200 mg, Oral, BID, Eugenie Filler, MD,  1,200 mg at 02/02/18 2204 .  magnesium oxide (MAG-OX) tablet 400 mg, 400 mg, Oral, Daily, Rise Patience, MD, 400 mg at 02/02/18 0915 .  ondansetron (ZOFRAN) tablet 4 mg, 4 mg, Oral, Q6H PRN **OR** ondansetron (ZOFRAN) injection 4 mg, 4 mg, Intravenous, Q6H PRN, Rise Patience, MD .  polyethylene glycol (MIRALAX / GLYCOLAX) packet 17 g, 17 g, Oral, Daily PRN, Eugenie Filler, MD .  rivaroxaban Alveda Reasons) tablet 10 mg, 10 mg, Oral, QPM, Rise Patience, MD, 10 mg at 02/02/18 1738 .  rOPINIRole (REQUIP) tablet 0.5 mg, 0.5 mg, Oral, QHS PRN, Purohit, Shrey C, MD .  senna (SENOKOT) tablet 8.6 mg, 1 tablet, Oral, Daily PRN, Rise Patience, MD .  sertraline (ZOLOFT) tablet 100 mg, 100 mg, Oral, QHS, Rise Patience, MD, 100 mg at 02/02/18 1945 .  Tbo-Filgrastim (GRANIX) injection 480 mcg, 480 mcg, Subcutaneous, q1800, Gabryel Talamo, Rudell Cobb, MD .  vancomycin (VANCOCIN) IVPB 1000 mg/200 mL premix, 1,000 mg, Intravenous, Q12H, Angela Adam, RPH, Stopped at 02/03/18 0200 .  zolpidem (AMBIEN)  tablet 10 mg, 10 mg, Oral, QHS PRN, Rise Patience, MD, 10 mg at 02/02/18 2203:  . albuterol  2.5 mg Nebulization Q6H  . famciclovir  500 mg Oral Daily  . furosemide  40 mg Intravenous Q12H  . guaiFENesin  1,200 mg Oral BID  . magnesium oxide  400 mg Oral Daily  . rivaroxaban  10 mg Oral QPM  . sertraline  100 mg Oral QHS  . Tbo-Filgrastim  480 mcg Subcutaneous q1800  :  No Known Allergies:  Family History  Problem Relation Age of Onset  . Uterine cancer Mother   . Heart disease Father   . Hypertension Father   . Multiple sclerosis Sister   . Paranoid behavior Brother   . Drug abuse Brother   . Schizophrenia Brother   . Stroke Maternal Grandfather   . Cancer Maternal Aunt   . Leukemia Paternal Aunt   . Healthy Son        x1  . Healthy Daughter        x2  . Allergies Daughter        x1  . Diabetes Neg Hx   . Alzheimer's disease Neg Hx   . Parkinson's disease Neg Hx   :  Social History   Socioeconomic History  . Marital status: Married    Spouse name: Not on file  . Number of children: 3  . Years of education: Not on file  . Highest education level: Not on file  Occupational History  . Not on file  Social Needs  . Financial resource strain: Not on file  . Food insecurity:    Worry: Not on file    Inability: Not on file  . Transportation needs:    Medical: Not on file    Non-medical: Not on file  Tobacco Use  . Smoking status: Former Smoker    Packs/day: 1.00    Years: 7.00    Pack years: 7.00    Types: Cigarettes    Last attempt to quit: 11/02/1980    Years since quitting: 37.2  . Smokeless tobacco: Never Used  Substance and Sexual Activity  . Alcohol use: Yes    Comment: occasional  . Drug use: No  . Sexual activity: Never  Lifestyle  . Physical activity:    Days per week: Not on file    Minutes per session: Not on file  . Stress: Not on  file  Relationships  . Social connections:    Talks on phone: Not on file    Gets together: Not on  file    Attends religious service: Not on file    Active member of club or organization: Not on file    Attends meetings of clubs or organizations: Not on file    Relationship status: Not on file  . Intimate partner violence:    Fear of current or ex partner: Not on file    Emotionally abused: Not on file    Physically abused: Not on file    Forced sexual activity: Not on file  Other Topics Concern  . Not on file  Social History Narrative  . Not on file  :  Pertinent items are noted in HPI.  Exam: Patient Vitals for the past 24 hrs:  BP Temp Temp src Pulse Resp SpO2 Weight  02/03/18 0500 - - - - - - 172 lb 14.4 oz (78.4 kg)  02/03/18 0448 112/74 98.9 F (37.2 C) Oral 93 18 91 % -  02/03/18 0132 - - - - - 96 % -  02/02/18 2128 116/68 98.1 F (36.7 C) Oral 88 18 99 % -  02/02/18 2107 - - - - - 94 % -  02/02/18 1923 - 100.3 F (37.9 C) Oral - - - -  02/02/18 1456 119/73 98.9 F (37.2 C) Oral 93 20 - -  02/02/18 1247 - - - - - 91 % -  02/02/18 0750 - - - - - 92 % -     Recent Labs    02/02/18 0527 02/03/18 0542  WBC 1.5* 1.4*  HGB 8.1* 8.7*  HCT 27.2* 29.2*  PLT 115* 124*   Recent Labs    02/02/18 0527 02/03/18 0542  NA 136 136  K 3.9 3.4*  CL 105 100  CO2 25 26  GLUCOSE 114* 113*  BUN 8 9  CREATININE 0.78 0.78  CALCIUM 7.9* 7.9*    Blood smear review: None  Pathology: None    Assessment and Plan: Mr. Summer is a 62 year old white male.  He has pneumonia.  He has myeloma.  I am more worried about the fact that his blood counts are quite low.  I have to believe that this is somehow reflective of his Revlimid.  He will had to be off Revlimid for a while.  He may actually need to have another bone marrow biopsy done so we can see exactly what is going on.  Hopefully, he does not have myelodysplasia.  He is getting ready to go to [place].  This is the end of August.  We need to make sure that he is in good shape to go to [place].  I will start him on  some Neupogen.  I want to see how well his white cells respond to a colony stimulator.  I will check another immunoglobulin level on him.  I agree with the antibiotics.  I appreciate the great care that he has been getting from everybody up on 4 E.  Lattie Haw, MD

## 2018-02-03 NOTE — Progress Notes (Addendum)
PROGRESS NOTE    Jorge [name]  DGL:[id number] DOB: 1955-09-14 DOA: 01/31/2018 PCP: Mackie Pai, PA-C    Brief Narrative:  62 year old with past medical history relevant for multiple myeloma status post bone marrow transplant with residual disease on lenalidomide, history of DVT on rivaroxaban who was admitted for cough and shortness of breath and found to have pneumonia.     Assessment & Plan:   Principal Problem:   Neutropenia with fever ([place]) Active Problems:   HCAP (healthcare-associated pneumonia)   Multiple myeloma ([place])   Acute deep vein thrombosis (DVT) of femoral vein of left lower extremity (H[facility])   Iron deficiency anemia due to chronic blood loss   Pancytopenia ([facility])   SOB (shortness of breath)  1 neutropenic fever Patient had presented with fevers, cough shortness of breath chest x-ray worrisome for pneumonia in the setting of an immunocompromised state.  Patient noted to have recent travel to [place] raising concern for endemic fungi such as called to the mycosis.  Patient on empiric IV vancomycin and cefepime with fever spikes trending down with a T-max of 100.3.  Fever curve trending down.  Blood cultures pending with no growth to date.  Urine histoplasmosis antigen negative.  Urine Legionella antigen negative.  Urine pneumococcus antigen negative.  Check a respiratory viral panel.  Continue empiric IV vancomycin IV cefepime.  Patient has been seen in consultation by ID who have ordered a fungal panel and I recommended that if continued fevers could likely start patient on fluconazole.  ID following and I appreciate the input and recommendations.   2.  Healthcare associated pneumonia Patient had presented with respiratory symptoms of cough and shortness of breath.  Chest x-ray concerning for pneumonia on admission.  Patient visibly short of breath this morning with a cough.  Blood cultures pending with no growth to date.  Urine Legionella antigen negative.  Urine  pneumococcus antigen negative.  Repeat a chest x-ray with no evidence of infiltrate right upper lung, right lung base subsegmental atelectasis and small right pleural effusion, left lung base subsegmental atelectasis/infiltrate noted, prominent sliding hiatal hernia.  Patient given IV Lasix with some clinical improvement in urine output of 1.5 L over the past 24 hours.  Continue Mucinex, scheduled nebs, empiric IV vancomycin IV cefepime.  ID following.  3.  Shortness of breath Likely secondary to problem #2.  Repeat chest x-ray on 02/02/2018 with no evidence of infiltrate right upper lung, right lung base subsegmental atelectasis and small right pleural effusion noted, left lung base subsegmental atelectasis/infiltrate noted.  Prominent sliding ischial hernia.  Patient was placed on Lasix 40 mg IV daily 12 hours x 4 doses with good urine output and clinical improvement.  Patient with a output of 1.550 L over the past 24 hours.  BNP was 209.5 and currently at 99.6. Patient with recent 2D echo done 11/11/2017 with an EF of 55 to 60%, no wall motion abnormalities, mild aortic valvular regurgitation.  Mild mitral valvular regurgitation.  Continue empiric IV antibiotics.  Follow.  4.  Pancytopenia Likely secondary to bone marrow transplant and lenalidomide.  WBC trending down currently at 1.4 with a ANC of 0.7.  Hemoglobin at 8.7 with a platelet count of 124.  Patient started on Neupogen per oncology.  Place on neutropenic precautions.  Oncology following and appreciate input and recommendations..   5.  IgG kappa multiple myeloma status post bone marrow transplant with residual disease Continue famciclovir.  Lenalidomide on hold.  Patient has been assessed by Dr. Marin Olp  of oncology who is following along.    6.  History of DVT Continue rivaroxaban.  7.  Depression Stable.  Continue home regimen sertraline.   DVT prophylaxis: Xarelto Code Status: Full Family Communication: Updated patient.  No family at  bedside.   Disposition Plan: Likely home once clinically improved and afebrile.   Consultants:   ID: Dr. Johnnye Sima 02/02/2018  Hematology/oncology Dr. Marin Olp 02/03/2018  Procedures:   Chest x-ray 01/31/2018, 02/02/2018    Antimicrobials:  IV cefepime 01/31/2018>>>> 02/09/2018  IV vancomycin 01/31/2018   Subjective: Patient in bed.  States breathing has improved since yesterday.  T-max of 100.3 last night.  Denies any chest pain.  States cough improving.   Objective: Vitals:   02/03/18 0448 02/03/18 0500 02/03/18 0757 02/03/18 1010  BP: 112/74     Pulse: 93     Resp: 18     Temp: 98.9 F (37.2 C)     TempSrc: Oral     SpO2: 91%  95% 95%  Weight:  78.4 kg (172 lb 14.4 oz)    Height:        Intake/Output Summary (Last 24 hours) at 02/03/2018 1158 Last data filed at 02/02/2018 1700 Gross per 24 hour  Intake 660 ml  Output 1550 ml  Net -890 ml   Filed Weights   01/31/18 2107 02/01/18 0100 02/03/18 0500  Weight: 79.4 kg (175 lb) 79.4 kg (175 lb 0.7 oz) 78.4 kg (172 lb 14.4 oz)    Examination:  General exam: NAD. Respiratory system: Bibasilar crackles, Fair air movement, decreased expiratory wheezing.  Speaking in full sentences.   Cardiovascular system: Regular rate and rhythm no murmurs rubs or gallops.  No JVD.  Trace lower extremity edema.    Gastrointestinal system: Abdomen is nontender, nondistended, soft, positive bowel sounds.  No rebound.  No guarding.   Central nervous system: Alert and oriented. No focal neurological deficits. Extremities: Symmetric 5 x 5 power. Skin: No rashes, lesions or ulcers Psychiatry: Judgement and insight appear normal. Mood & affect appropriate.     Data Reviewed: I have personally reviewed following labs and imaging studies  CBC: Recent Labs  Lab 01/31/18 2128 02/01/18 0516 02/02/18 0527 02/03/18 0542  WBC 2.2* 2.0* 1.5* 1.4*  NEUTROABS 1.2*  --  1.1* 0.7*  HGB 8.0* 8.1* 8.1* 8.7*  HCT 26.6* 27.9* 27.2* 29.2*  MCV 89.3  89.1 88.0 87.2  PLT 124* 150 115* 979*   Basic Metabolic Panel: Recent Labs  Lab 01/31/18 2128 02/01/18 0516 02/02/18 0527 02/03/18 0542  NA 140 141 136 136  K 3.7 4.0 3.9 3.4*  CL 110 108 105 100  CO2 _0 GLUCOSE 78 96 114* 113*  BUN _1 CREATININE 0.90 0.89 0.78 0.78  CALCIUM 8.1* 8.2* 7.9* 7.9*  MG  --   --  2.0 2.0   GFR: Estimated Creatinine Clearance: 90.7 mL/min (by C-G formula based on SCr of 0.78 mg/dL). Liver Function Tests: No results for input(s): AST, ALT, ALKPHOS, BILITOT, PROT, ALBUMIN in the last 168 hours. No results for input(s): LIPASE, AMYLASE in the last 168 hours. No results for input(s): AMMONIA in the last 168 hours. Coagulation Profile: No results for input(s): INR, PROTIME in the last 168 hours. Cardiac Enzymes: Recent Labs  Lab 01/31/18 2128  TROPONINI <0.03   BNP (last 3 results) No results for input(s): PROBNP in the last 8760 hours. HbA1C: No results for input(s): HGBA1C in the last 72 hours. CBG: No  results for input(s): GLUCAP in the last 168 hours. Lipid Profile: No results for input(s): CHOL, HDL, LDLCALC, TRIG, CHOLHDL, LDLDIRECT in the last 72 hours. Thyroid Function Tests: No results for input(s): TSH, T4TOTAL, FREET4, T3FREE, THYROIDAB in the last 72 hours. Anemia Panel: No results for input(s): VITAMINB12, FOLATE, FERRITIN, TIBC, IRON, RETICCTPCT in the last 72 hours. Sepsis Labs: Recent Labs  Lab 01/31/18 2229 02/01/18 0825 02/02/18 0527 02/03/18 0542  PROCALCITON  --  <0.10 <0.10 <0.10  LATICACIDVEN 1.03  --   --   --     Recent Results (from the past 240 hour(s))  Culture, blood (routine x 2) Call MD if unable to obtain prior to antibiotics being given     Status: None (Preliminary result)   Collection Time: 01/31/18 10:37 PM  Result Value Ref Range Status   Specimen Description   Final    BLOOD LEFT ANTECUBITAL Performed at [facility], [address] [place], [place] [address]     Special Requests   Final    BOTTLES DRAWN AEROBIC AND ANAEROBIC Blood Culture results may not be optimal due to an inadequate volume of blood received in culture bottles Performed at [facility], [address], [place], [place] [address]    Culture   Final    NO GROWTH 1 DAY Performed at [facility], [place] [address] [place], [place] [address]    Report Status PENDING  Incomplete  Culture, blood (routine x 2) Call MD if unable to obtain prior to antibiotics being given     Status: None (Preliminary result)   Collection Time: 01/31/18 10:37 PM  Result Value Ref Range Status   Specimen Description   Final    BLOOD RIGHT ANTECUBITAL Performed at [facility], [address] [place], [place] [address]    Special Requests   Final    BOTTLES DRAWN AEROBIC AND ANAEROBIC Blood Culture results may not be optimal due to an inadequate volume of blood received in culture bottles Performed at [facility], [address] [place], [place] [address]    Culture   Final    NO GROWTH 1 DAY Performed at [facility], [place] [address] [place], [place] [address]    Report Status PENDING  Incomplete  Culture, Urine     Status: None   Collection Time: 02/02/18  1:05 PM  Result Value Ref Range Status   Specimen Description   Final    URINE, CLEAN CATCH Performed at [facility], [place] [address] [place], [place] [address]    Special Requests   Final    NONE Performed at [facility], [place] [address], [place], [place] [address]    Culture   Final    NO GROWTH Performed at [facility], [place] [address] [place], [place] [address]    Report Status 02/03/2018 FINAL  Final         Radiology Studies: Dg Chest 2 View  Result Date: 02/02/2018 CLINICAL DATA:  Follow-up pneumonia. Shortness of breath. Coughing. EXAM: CHEST - 2 VIEW COMPARISON:  01/31/2018. FINDINGS: Mediastinum hilar structures are normal. Heart  size normal. No evidence of infiltrate right upper lobe on today's exam. Persistent right base subsegmental atelectasis. Small right pleural effusion. Elevation right hemidiaphragm. Mild left base subsegmental atelectasis/infiltrate. No pneumothorax. Sliding hiatal hernia. Multiple thoracic and lumbar spine vertebroplasties. IMPRESSION: 1. No evidence of infiltrate right upper  lung noted on today's exam. Right lung base subsegmental atelectasis and small right pleural effusion noted. Left lung base subsegmental atelectasis/infiltrate noted on today's exam. 2.  Prominent sliding hiatal hernia. Electronically Signed   By: Marcello Moores  Register   On: 02/02/2018 14:55        Scheduled Meds: . albuterol  2.5 mg Nebulization TID  . famciclovir  500 mg Oral Daily  . furosemide  40 mg Intravenous Q12H  . guaiFENesin  1,200 mg Oral BID  . magnesium oxide  400 mg Oral Daily  . rivaroxaban  10 mg Oral QPM  . sertraline  100 mg Oral QHS  . Tbo-Filgrastim  480 mcg Subcutaneous q1800   Continuous Infusions: . ceFEPime (MAXIPIME) IV 1 g (02/03/18 0517)  . vancomycin Stopped (02/03/18 0200)     LOS: 3 days    Time spent: 35 minutes    Irine Seal, MD [facility] Pager [phone]  If 7PM-7AM, please contact night-coverage [email or web address] Password [facility] 02/03/2018, 11:58 AM

## 2018-02-03 NOTE — Progress Notes (Signed)
INFECTIOUS DISEASE PROGRESS NOTE  ID: Jorge [name] is a 62 y.o. male with  Principal Problem:   Neutropenia with fever ([place]) Active Problems:   Multiple myeloma ([name])   Acute deep vein thrombosis (DVT) of femoral vein of left lower extremity (HCC)   Iron deficiency anemia due to chronic blood loss   HCAP (healthcare-associated pneumonia)   Pancytopenia (HCC)   SOB (shortness of breath)  Subjective: Resting quietly.   Abtx:  Anti-infectives (From admission, onward)   Start     Dose/Rate Route Frequency Ordered Stop   02/01/18 1200  vancomycin (VANCOCIN) IVPB 1000 mg/200 mL premix     1,000 mg 200 mL/hr over 60 Minutes Intravenous Every 12 hours 02/01/18 0313     02/01/18 1000  famciclovir (FAMVIR) tablet 500 mg     500 mg Oral Daily 02/01/18 0258     02/01/18 0600  ceFEPIme (MAXIPIME) 1 g in sodium chloride 0.9 % 100 mL IVPB     1 g 200 mL/hr over 30 Minutes Intravenous Every 8 hours 02/01/18 0258 02/09/18 0559   01/31/18 2241  ceFEPIme (MAXIPIME) 1 g injection    Note to Pharmacy:  Shellee Milo   : cabinet override      01/31/18 2241 01/31/18 2245   01/31/18 2215  ceFEPIme (MAXIPIME) 1 g in sodium chloride 0.9 % 100 mL IVPB     1 g 200 mL/hr over 30 Minutes Intravenous  Once 01/31/18 2207 01/31/18 2321   01/31/18 2215  vancomycin (VANCOCIN) IVPB 1000 mg/200 mL premix     1,000 mg 200 mL/hr over 60 Minutes Intravenous  Once 01/31/18 2207 02/01/18 0842      Medications:  Scheduled: . albuterol  2.5 mg Nebulization TID  . famciclovir  500 mg Oral Daily  . furosemide  40 mg Intravenous Q12H  . guaiFENesin  1,200 mg Oral BID  . magnesium oxide  400 mg Oral Daily  . rivaroxaban  10 mg Oral QPM  . sertraline  100 mg Oral QHS  . Tbo-Filgrastim  480 mcg Subcutaneous q1800    Objective: Vital signs in last 24 hours: Temp:  [98.1 F (36.7 C)-100.3 F (37.9 C)] 98.1 F (36.7 C) (07/24 1331) Pulse Rate:  [88-93] 88 (07/24 1331) Resp:  [18] 18 (07/24 1331) BP:  (112-124)/(68-81) 124/81 (07/24 1331) SpO2:  [91 %-99 %] 94 % (07/24 1331) Weight:  [78.4 kg (172 lb 14.4 oz)] 78.4 kg (172 lb 14.4 oz) (07/24 0500)   General appearance: fatigued and no distress Resp: rhonchi anterior - mild bilateral Cardio: regular rate and rhythm GI: normal findings: bowel sounds normal and soft, non-tender  Lab Results Recent Labs    02/02/18 0527 02/03/18 0542  WBC 1.5* 1.4*  HGB 8.1* 8.7*  HCT 27.2* 29.2*  NA 136 136  K 3.9 3.4*  CL 105 100  CO2 25 26  BUN 8 9  CREATININE 0.78 0.78   Liver Panel No results for input(s): PROT, ALBUMIN, AST, ALT, ALKPHOS, BILITOT, BILIDIR, IBILI in the last 72 hours. Sedimentation Rate No results for input(s): ESRSEDRATE in the last 72 hours. C-Reactive Protein No results for input(s): CRP in the last 72 hours.  Microbiology: Recent Results (from the past 240 hour(s))  Culture, blood (routine x 2) Call MD if unable to obtain prior to antibiotics being given     Status: None (Preliminary result)   Collection Time: 01/31/18 10:37 PM  Result Value Ref Range Status   Specimen Description   Final  BLOOD LEFT ANTECUBITAL Performed at [facility], [address] [place], [place] [address]    Special Requests   Final    BOTTLES DRAWN AEROBIC AND ANAEROBIC Blood Culture results may not be optimal due to an inadequate volume of blood received in culture bottles Performed at [facility], [address] [place], [place] [address]    Culture   Final    NO GROWTH 2 DAYS Performed at [facility], [place] [address] [place], [place] [address]    Report Status PENDING  Incomplete  Culture, blood (routine x 2) Call MD if unable to obtain prior to antibiotics being given     Status: None (Preliminary result)   Collection Time: 01/31/18 10:37 PM  Result Value Ref Range Status   Specimen Description   Final    BLOOD RIGHT ANTECUBITAL Performed at [facility], [address]  [place], [place] [address]    Special Requests   Final    BOTTLES DRAWN AEROBIC AND ANAEROBIC Blood Culture results may not be optimal due to an inadequate volume of blood received in culture bottles Performed at [facility], [address] [place], [place] [address]    Culture   Final    NO GROWTH 2 DAYS Performed at [facility], [place] [address], [place], [place] [address]    Report Status PENDING  Incomplete  Culture, Urine     Status: None   Collection Time: 02/02/18  1:05 PM  Result Value Ref Range Status   Specimen Description   Final    URINE, CLEAN CATCH Performed at [facility], [place] [address], [place], [place] [address]    Special Requests   Final    NONE Performed at [facility], [place] [address], [place], [place] [address]    Culture   Final    NO GROWTH Performed at [facility], [place] [address], [place], [place] [address]    Report Status 02/03/2018 FINAL  Final    Studies/Results: Dg Chest 2 View  Result Date: 02/02/2018 CLINICAL DATA:  Follow-up pneumonia. Shortness of breath. Coughing. EXAM: CHEST - 2 VIEW COMPARISON:  01/31/2018. FINDINGS: Mediastinum hilar structures are normal. Heart size normal. No evidence of infiltrate right upper lobe on today's exam. Persistent right base subsegmental atelectasis. Small right pleural effusion. Elevation right hemidiaphragm. Mild left base subsegmental atelectasis/infiltrate. No pneumothorax. Sliding hiatal hernia. Multiple thoracic and lumbar spine vertebroplasties. IMPRESSION: 1. No evidence of infiltrate right upper lung noted on today's exam. Right lung base subsegmental atelectasis and small right pleural effusion noted. Left lung base subsegmental atelectasis/infiltrate noted on today's exam. 2.  Prominent sliding hiatal hernia. Electronically Signed   By: Marcello Moores  Register   On: 02/02/2018 14:55     Assessment/Plan: Neutropenic fever Multiple Myeloma  Total  days of antibiotics: 3 vanco/cefepime  Respiratory panel pending.  BCx are ngtd Fungal serologies pending (histo -) Fever better but ANC worse (700) Started GCSF today Would not change anbx at this point.          Bobby Rumpf MD, FACP Infectious Diseases (pager) [phone] [email or web address] 02/03/2018, 3:47 PM  LOS: 3 days

## 2018-02-04 DIAGNOSIS — B348 Other viral infections of unspecified site: Secondary | ICD-10-CM | POA: Diagnosis present

## 2018-02-04 DIAGNOSIS — B9781 Human metapneumovirus as the cause of diseases classified elsewhere: Secondary | ICD-10-CM

## 2018-02-04 LAB — CBC WITH DIFFERENTIAL/PLATELET
Basophils Absolute: 0 10*3/uL (ref 0.0–0.1)
Basophils Relative: 0 %
Eosinophils Absolute: 0 10*3/uL (ref 0.0–0.7)
Eosinophils Relative: 1 %
HCT: 30.9 % — ABNORMAL LOW (ref 39.0–52.0)
Hemoglobin: 9.2 g/dL — ABNORMAL LOW (ref 13.0–17.0)
Lymphocytes Relative: 18 %
Lymphs Abs: 0.7 10*3/uL (ref 0.7–4.0)
MCH: 25.6 pg — AB (ref 26.0–34.0)
MCHC: 29.8 g/dL — AB (ref 30.0–36.0)
MCV: 85.8 fL (ref 78.0–100.0)
MONO ABS: 0.5 10*3/uL (ref 0.1–1.0)
Monocytes Relative: 11 %
Neutro Abs: 2.9 10*3/uL (ref 1.7–7.7)
Neutrophils Relative %: 70 %
PLATELETS: 144 10*3/uL — AB (ref 150–400)
RBC: 3.6 MIL/uL — ABNORMAL LOW (ref 4.22–5.81)
RDW: 18.3 % — ABNORMAL HIGH (ref 11.5–15.5)
WBC: 4.2 10*3/uL (ref 4.0–10.5)

## 2018-02-04 LAB — EXPECTORATED SPUTUM ASSESSMENT W REFEX TO RESP CULTURE

## 2018-02-04 LAB — BASIC METABOLIC PANEL
Anion gap: 10 (ref 5–15)
BUN: 12 mg/dL (ref 8–23)
CHLORIDE: 101 mmol/L (ref 98–111)
CO2: 27 mmol/L (ref 22–32)
CREATININE: 0.78 mg/dL (ref 0.61–1.24)
Calcium: 8.2 mg/dL — ABNORMAL LOW (ref 8.9–10.3)
GFR calc Af Amer: >60 mL/min (ref >60)
GFR calc non Af Amer: >60 mL/min (ref >60)
Glucose, Bld: 100 mg/dL — ABNORMAL HIGH (ref 70–99)
Potassium: 3 mmol/L — ABNORMAL LOW (ref 3.5–5.1)
SODIUM: 138 mmol/L (ref 135–145)

## 2018-02-04 LAB — IGG, IGA, IGM
IGM (IMMUNOGLOBULIN M), SRM: 21 mg/dL (ref 20–172)
IgA: 194 mg/dL (ref 61–437)
IgG (Immunoglobin G), Serum: 712 mg/dL (ref 700–1600)

## 2018-02-04 LAB — MAGNESIUM: MAGNESIUM: 2 mg/dL (ref 1.7–2.4)

## 2018-02-04 LAB — EXPECTORATED SPUTUM ASSESSMENT W GRAM STAIN, RFLX TO RESP C

## 2018-02-04 MED ORDER — ACETAMINOPHEN 325 MG PO TABS: 650.0000 mg | ORAL_TABLET | Freq: Four times a day (QID) | ORAL | Status: DC | PRN

## 2018-02-04 MED ORDER — ALBUTEROL SULFATE (2.5 MG/3ML) 0.083% IN NEBU: 2.5000 mg | INHALATION_SOLUTION | Freq: Two times a day (BID) | RESPIRATORY_TRACT | Status: DC

## 2018-02-04 MED ORDER — SODIUM CHLORIDE 0.9 % IV SOLN
2.0000 g | Freq: Three times a day (TID) | INTRAVENOUS | Status: DC
  Administered 2018-02-04: 2 g via INTRAVENOUS
  Filled 2018-02-04 (×2): qty 2

## 2018-02-04 MED ORDER — AMOXICILLIN-POT CLAVULANATE 875-125 MG PO TABS: 1.0000 | ORAL_TABLET | Freq: Two times a day (BID) | ORAL | 0 refills | Status: AC

## 2018-02-04 MED ORDER — POTASSIUM CHLORIDE CRYS ER 20 MEQ PO TBCR
40.0000 meq | EXTENDED_RELEASE_TABLET | ORAL | Status: AC
  Administered 2018-02-04 (×2): 40 meq via ORAL
  Filled 2018-02-04 (×2): qty 2

## 2018-02-04 MED ORDER — VANCOMYCIN HCL IN DEXTROSE 750-5 MG/150ML-% IV SOLN
750.0000 mg | Freq: Two times a day (BID) | INTRAVENOUS | Status: DC
  Administered 2018-02-04: 750 mg via INTRAVENOUS
  Filled 2018-02-04: qty 150

## 2018-02-04 MED ORDER — GUAIFENESIN ER 600 MG PO TB12: 1200.0000 mg | ORAL_TABLET | Freq: Two times a day (BID) | ORAL | 0 refills | Status: AC

## 2018-02-04 NOTE — Discharge Summary (Signed)
Physician Discharge Summary  Cedarius Kersh GYJ:[id number] DOB: 1955/09/10 DOA: 01/31/2018  PCP: Mackie Pai, PA-C  Admit date: 01/31/2018 Discharge date: 02/04/2018  Time spent: 60 minutes  Recommendations for Outpatient Follow-up:  1. Follow-up with Dr. Marin Olp, oncology in 1 week.  On follow-up patient will need a CBC with differential done to follow-up on counts.  Patient will also need a basic metabolic profile done to follow-up on electrolytes and renal function.  Fungal panel which was obtained during this hospitalization will need to be followed up upon. 2. Follow-up with Saguier, Percell Miller, PA-C in 2 weeks.  On follow-up fungal panel which was obtained during this hospitalization will need to be followed up upon.  Patient will need a basic metabolic profile done.   Discharge Diagnoses:  Principal Problem:   Neutropenia with fever ([place]) Active Problems:   HCAP (healthcare-associated pneumonia)   Infection due to human metapneumovirus (hMPV)   Multiple myeloma ([place])   Acute deep vein thrombosis (DVT) of femoral vein of left lower extremity (HCC)   Iron deficiency anemia due to chronic blood loss   Pancytopenia (HCC)   SOB (shortness of breath)   Discharge Condition: Stable and improved  Diet recommendation: Regular  Filed Weights   02/01/18 0100 02/03/18 0500 02/04/18 0438  Weight: 79.4 kg (175 lb 0.7 oz) 78.4 kg (172 lb 14.4 oz) 77 kg (169 lb 11.2 oz)    History of present illness:  Jorge [name] is a 62 y.o. male with history of multiple myeloma on Revlimid, history of DVT on Xarelto presented to the ER in [facility] with complaints of shortness of breath and productive cough.  Patient recently traveled to [place] last week and started having some shortness of breath which patient is here could be from altitude.  Patient gradually had worsening shortness of breath with cough and productive sputum which further worsened after he reached reasonable yesterday.  At home patient  recorded a temperature of 102 F.  Was able to bring some sputum on deep coughing.  Denied any chest pain.  Has not taken Revlimid due to the symptoms for last 1 day.  Patient was admitted in May this year for pneumonia.  ED Course: In the ER patient had chest x-ray which shows features concerning for infiltrates possible pneumonia.  Patient started on empiric antibiotics for pneumonia.  Lab work showed pancytopenia in addition.    Hospital Course:  1 neutropenic fever Patient had presented with fevers, cough shortness of breath chest x-ray worrisome for pneumonia in the setting of an immunocompromised state.  Patient noted to have recent travel to [place] raising concern for endemic fungi such as called to the mycosis.  Patient was placed on empiric IV vancomycin and cefepime on admission and noted to have ongoing fevers with temperatures going up as high as 102.6.  Patient was pancultured with blood cultures with no growth to date.  Sputum cultures were pending.  Respiratory viral panel by PCR was positive for metapneumovirus.  Urine histoplasmosis antigen negative.  Urine Legionella antigen negative.  Urine pneumococcus antigen negative.  Due to ongoing fevers despite empiric IV antibiotics ID was consulted for further evaluation and management.  Fungal panel was ordered per ID which was pending by time of discharge.  Patient defervesced, fever curve trended down and patient improved clinically.  IV vancomycin was subsequently discontinued.  Patient will be discharged home on 4 more days of oral Augmentin to complete a 7-day course of antibiotic treatment.  Outpatient follow-up with PCP and oncology.  2.  Healthcare associated pneumonia/metapneumovirus Patient had presented with respiratory symptoms of cough and shortness of breath.  Chest x-ray concerning for pneumonia on admission.  Patient noted to be visibly short of breath on 02/03/2008.  Blood cultures were ordered with no growth to date.  Urine  Legionella antigen negative.  Urine pneumococcus antigen negative.  Repeat a chest x-ray with no evidence of infiltrate right upper lung, right lung base subsegmental atelectasis and small right pleural effusion, left lung base subsegmental atelectasis/infiltrate noted, prominent sliding hiatal hernia.    Patient placed empirically on IV vancomycin and IV cefepime during the hospitalization.  Patient given 4 doses of IV Lasix with good urine output and clinical improvement.  Patient maintained on Mucinex as well as scheduled nebulizers.  ID was consulted due to neutropenic fevers and follow the patient throughout the hospitalization.  Patient will be transitioned to oral Augmentin on discharge for 4 more days of oral antibiotics to complete a one-week course of antibiotic treatment.  Outpatient follow-up with PCP.   3.  Shortness of breath Likely secondary to problem #2.  Repeat chest x-ray on 02/02/2018 with no evidence of infiltrate right upper lung, right lung base subsegmental atelectasis and small right pleural effusion noted, left lung base subsegmental atelectasis/infiltrate noted.  Prominent sliding ischial hernia.  Patient was placed on Lasix 40 mg IV daily 12 hours x 4 doses with good urine output and clinical improvement.  BNP slightly elevated at 209.5 and improved down to 99.6. Patient with recent 2D echo done 11/11/2017 with an EF of 55 to 60%, no wall motion abnormalities, mild aortic valvular regurgitation.  Mild mitral valvular regurgitation.  Patient was placed empirically on IV antibiotics throughout the hospitalization with discharge on oral antibiotics to complete a 7-day course of treatment.  Outpatient follow-up with PCP.   4.  Pancytopenia Likely secondary to bone marrow transplant and lenalidomide.  WBC trending down as low as 1.4 with a ANC of 0.7.  Patient with a hemoglobin stabilized around 8.7 and a platelet count as low as 115.  Patient had no bleeding.  Patient given a dose of  Neupogen per oncology and placed on neutropenic precautions.  WBC improved and ANC went up to two-point 1:09 dose of Neupogen.  Patient improved clinically and was followed by oncology throughout the hospitalization.  Patient will follow-up with oncology in the outpatient setting.    5.  IgG kappa multiple myeloma status post bone marrow transplant with residual disease Continued on home regimen of famciclovir.  Lenalidomide was held throughout the hospitalization and discontinued on discharge until follow-up with oncology due to concerns for bone marrow suppression.  Patient has been assessed by Dr. Marin Olp of oncology who followed the patient throughout the hospitalization.  Outpatient follow-up.    6.  History of DVT Continued on home regimen of rivaroxaban.  7.  Depression Stable.    Patient maintained on home regimen of sertraline.      Procedures:  Chest x-ray 01/31/2018, 02/02/2018      Consultations:  ID: Dr. Johnnye Sima 02/02/2018  Hematology/oncology Dr. Marin Olp 02/03/2018      Discharge Exam: Vitals:   02/04/18 0900 02/04/18 1213  BP:  120/75  Pulse: 77 82  Resp: 17   Temp:  98 F (36.7 C)  SpO2: 93% 97%    General: NAD Cardiovascular: RRR Respiratory: Bibasilar crackles.  No wheezing.  Discharge Instructions   Discharge Instructions    Diet general   Complete by:  As directed  Increase activity slowly   Complete by:  As directed      Allergies as of 02/04/2018   No Known Allergies     Medication List    STOP taking these medications   amoxicillin 500 MG tablet Commonly known as:  AMOXIL   lenalidomide 10 MG capsule Commonly known as:  REVLIMID     TAKE these medications   acetaminophen 325 MG tablet Commonly known as:  TYLENOL Take 2 tablets (650 mg total) by mouth every 6 (six) hours as needed for mild pain (or Fever >/= 101).   albuterol 108 (90 Base) MCG/ACT inhaler Commonly known as:  PROVENTIL HFA;VENTOLIN HFA Inhale 2 puffs  into the lungs every 6 (six) hours as needed for wheezing or shortness of breath.   amoxicillin-clavulanate 875-125 MG tablet Commonly known as:  AUGMENTIN Take 1 tablet by mouth 2 (two) times daily for 4 days.   benzonatate 200 MG capsule Commonly known as:  TESSALON Take 1 capsule (200 mg total) by mouth 3 (three) times daily as needed for cough.   cetirizine 10 MG tablet Commonly known as:  ZYRTEC Take 10 mg by mouth daily as needed for allergies.   cholecalciferol 1000 units tablet Commonly known as:  VITAMIN D Take 1,000 Units by mouth daily.   famciclovir 500 MG tablet Commonly known as:  FAMVIR Take 1 tablet (500 mg total) by mouth daily.   guaiFENesin 600 MG 12 hr tablet Commonly known as:  MUCINEX Take 2 tablets (1,200 mg total) by mouth 2 (two) times daily for 4 days.   lidocaine 5 % Commonly known as:  LIDODERM PLAEC 1 PATCH ONTO THE SKIN DAILY as needed for pain. APPLY 1 PATCH TO THE MOST PAINFUL AREA FOR 12 HR IN A 24 HR PERIOD   magnesium oxide 400 MG tablet Commonly known as:  MAG-OX Take 400 mg by mouth daily.   ondansetron 8 MG disintegrating tablet Commonly known as:  ZOFRAN-ODT Take 1 tablet (8 mg total) by mouth every 8 (eight) hours as needed. What changed:  reasons to take this   OVER THE COUNTER MEDICATION Take 1 tablet by mouth daily.   polyethylene glycol powder powder Commonly known as:  MIRALAX Take 17 g by mouth daily. What changed:    when to take this  reasons to take this   PROBIOTIC PO Take 1 capsule by mouth daily.   rOPINIRole 0.25 MG tablet Commonly known as:  REQUIP TAKE 1 TABLET BY MOUTH 1 TO 3 HOURS BEFORE BED FOR 2 DAYS THEN INCREASE TO 2 TABS What changed:  See the new instructions.   senna 8.6 MG Tabs tablet Commonly known as:  SENOKOT Take 2 tablets (17.2 mg total) by mouth daily. What changed:    how much to take  when to take this  reasons to take this   sertraline 100 MG tablet Commonly known as:   ZOLOFT Take 100 mg by mouth at bedtime.   XARELTO 10 MG Tabs tablet Generic drug:  rivaroxaban Take 1 tablet (10 mg total) by mouth daily. What changed:  when to take this   zolpidem 10 MG tablet Commonly known as:  AMBIEN Take 1 tablet (10 mg total) by mouth at bedtime as needed.   ZOMETA IV Inject 4 mg into the vein every 3 (three) months. Receives at [name] office.      No Known Allergies Follow-up Information    Saguier, Iris Pert. Schedule an appointment as soon as possible for a  visit in 2 week(s).   Specialties:  Internal Medicine, Family Medicine Contact information: [address] [place] [address] [phone]        [name], MD. Schedule an appointment as soon as possible for a visit in 1 week(s).   Specialty:  Oncology Contact information: [place] [address] [phone]            The results of significant diagnostics from this hospitalization (including imaging, microbiology, ancillary and laboratory) are listed below for reference.    Significant Diagnostic Studies: Dg Chest 2 View  Result Date: 02/02/2018 CLINICAL DATA:  Follow-up pneumonia. Shortness of breath. Coughing. EXAM: CHEST - 2 VIEW COMPARISON:  01/31/2018. FINDINGS: Mediastinum hilar structures are normal. Heart size normal. No evidence of infiltrate right upper lobe on today's exam. Persistent right base subsegmental atelectasis. Small right pleural effusion. Elevation right hemidiaphragm. Mild left base subsegmental atelectasis/infiltrate. No pneumothorax. Sliding hiatal hernia. Multiple thoracic and lumbar spine vertebroplasties. IMPRESSION: 1. No evidence of infiltrate right upper lung noted on today's exam. Right lung base subsegmental atelectasis and small right pleural effusion noted. Left lung base subsegmental atelectasis/infiltrate noted on today's exam. 2.  Prominent sliding hiatal hernia. Electronically Signed    By: Marcello Moores  Register   On: 02/02/2018 14:55   Dg Chest 2 View  Result Date: 01/31/2018 CLINICAL DATA:  Increased respiratory effort for 6 days. Fever. Increased cough and congestion. Previous smoker. History of multiple myeloma. EXAM: CHEST - 2 VIEW COMPARISON:  11/10/2017 FINDINGS: Heart size and pulmonary vascularity are normal. Increased opacity in the right base similar to previous study. This could reflect elevation of the hemidiaphragm or sub pulmonic effusion. Linear atelectasis in the lung bases. Since the previous study, there is development of mild perihilar infiltration in the right upper lung. This could reflect superimposed pneumonia. No blunting of costophrenic angles. No pneumothorax. Mediastinal contours appear intact. Old bilateral rib fractures. Multiple thoracic and lumbar spine compression deformities with kyphoplasty changes. Bone changes are consistent with history of myeloma. IMPRESSION: Persistent elevation of the right hemidiaphragm versus subpulmonic effusion. Probable developing infiltrate in the right upper lung. Bone changes consistent with history of myeloma. Electronically Signed   By: Lucienne Capers M.D.   On: 01/31/2018 21:53    Microbiology: Recent Results (from the past 240 hour(s))  Culture, blood (routine x 2) Call MD if unable to obtain prior to antibiotics being given     Status: None (Preliminary result)   Collection Time: 01/31/18 10:37 PM  Result Value Ref Range Status   Specimen Description   Final    BLOOD LEFT ANTECUBITAL Performed at [facility], [address] [place], [place] [address]    Special Requests   Final    BOTTLES DRAWN AEROBIC AND ANAEROBIC Blood Culture results may not be optimal due to an inadequate volume of blood received in culture bottles Performed at [facility], [address] [place], [place] [address]    Culture   Final    NO GROWTH 3 DAYS Performed at [facility], [place] [address],  [place], [place] [address]    Report Status PENDING  Incomplete  Culture, blood (routine x 2) Call MD if unable to obtain prior to antibiotics being given     Status: None (Preliminary result)   Collection Time: 01/31/18 10:37 PM  Result Value Ref Range Status   Specimen Description   Final  BLOOD RIGHT ANTECUBITAL Performed at [facility], [address] [place], [place] [address]    Special Requests   Final    BOTTLES DRAWN AEROBIC AND ANAEROBIC Blood Culture results may not be optimal due to an inadequate volume of blood received in culture bottles Performed at [facility], [address] [place], [place] [address]    Culture   Final    NO GROWTH 3 DAYS Performed at [facility], [place] [address], [place], [place] [address]    Report Status PENDING  Incomplete  Culture, Urine     Status: None   Collection Time: 02/02/18  1:05 PM  Result Value Ref Range Status   Specimen Description   Final    URINE, CLEAN CATCH Performed at [facility], [place] [address], [place], [place] [address]    Special Requests   Final    NONE Performed at [facility], [place] [address], [place], [place] [address]    Culture   Final    NO GROWTH Performed at [facility], [place] [address], [place], [place] [address]    Report Status 02/03/2018 FINAL  Final  Respiratory Panel by PCR     Status: Abnormal   Collection Time: 02/03/18 12:26 PM  Result Value Ref Range Status   Adenovirus NOT DETECTED NOT DETECTED Final   Coronavirus 229E NOT DETECTED NOT DETECTED Final   Coronavirus HKU1 NOT DETECTED NOT DETECTED Final   Coronavirus NL63 NOT DETECTED NOT DETECTED Final   Coronavirus OC43 NOT DETECTED NOT DETECTED Final   Metapneumovirus DETECTED (A) NOT DETECTED Final   Rhinovirus / Enterovirus NOT DETECTED NOT DETECTED Final   Influenza A NOT DETECTED NOT DETECTED Final   Influenza B NOT DETECTED NOT DETECTED Final   Parainfluenza  Virus 1 NOT DETECTED NOT DETECTED Final   Parainfluenza Virus 2 NOT DETECTED NOT DETECTED Final   Parainfluenza Virus 3 NOT DETECTED NOT DETECTED Final   Parainfluenza Virus 4 NOT DETECTED NOT DETECTED Final   Respiratory Syncytial Virus NOT DETECTED NOT DETECTED Final   Bordetella pertussis NOT DETECTED NOT DETECTED Final   Chlamydophila pneumoniae NOT DETECTED NOT DETECTED Final   Mycoplasma pneumoniae NOT DETECTED NOT DETECTED Final    Comment: Performed at [facility], [place] [address], [place], [place] [address]  Culture, sputum-assessment     Status: None   Collection Time: 02/04/18  2:37 AM  Result Value Ref Range Status   Specimen Description SPUTUM  Final   Special Requests NONE  Final   Sputum evaluation   Final    THIS SPECIMEN IS ACCEPTABLE FOR SPUTUM CULTURE Performed at [facility], [place] [address] [place], [place] [address]    Report Status 02/04/2018 FINAL  Final  Culture, respiratory     Status: None (Preliminary result)   Collection Time: 02/04/18  2:37 AM  Result Value Ref Range Status   Specimen Description   Final    SPUTUM Performed at [place] [address] [place], [place] [address]    Special Requests   Final    NONE Reflexed from [id number] Performed at [facility], [place] [address], [place], [place] [address]    Gram Stain   Final    FEW WBC PRESENT,BOTH PMN AND MONONUCLEAR RARE SQUAMOUS EPITHELIAL CELLS PRESENT FEW GRAM NEGATIVE RODS FEW GRAM POSITIVE COCCI Performed at [facility], [place] [address],  [place], [place] [address]    Culture PENDING  Incomplete   Report Status PENDING  Incomplete     Labs: Basic Metabolic Panel: Recent Labs  Lab 01/31/18 2128 02/01/18 0516 02/02/18 0527 02/03/18 0542 02/04/18 0509  NA 140 141 136 136 138  K 3.7 4.0 3.9 3.4* 3.0*  CL 110 108 105 100 101  CO2 '23 26 25 26 27  ' GLUCOSE 78 96 114* 113* 100*  BUN '9 8 8 9 12  ' CREATININE 0.90  0.89 0.78 0.78 0.78  CALCIUM 8.1* 8.2* 7.9* 7.9* 8.2*  MG  --   --  2.0 2.0 2.0   Liver Function Tests: No results for input(s): AST, ALT, ALKPHOS, BILITOT, PROT, ALBUMIN in the last 168 hours. No results for input(s): LIPASE, AMYLASE in the last 168 hours. No results for input(s): AMMONIA in the last 168 hours. CBC: Recent Labs  Lab 01/31/18 2128 02/01/18 0516 02/02/18 0527 02/03/18 0542 02/04/18 0509  WBC 2.2* 2.0* 1.5* 1.4* 4.2  NEUTROABS 1.2*  --  1.1* 0.7* 2.9  HGB 8.0* 8.1* 8.1* 8.7* 9.2*  HCT 26.6* 27.9* 27.2* 29.2* 30.9*  MCV 89.3 89.1 88.0 87.2 85.8  PLT 124* 150 115* 124* 144*   Cardiac Enzymes: Recent Labs  Lab 01/31/18 2128  TROPONINI <0.03   BNP: BNP (last 3 results) Recent Labs    02/02/18 1316 02/03/18 0542  BNP 209.5* 99.6    ProBNP (last 3 results) No results for input(s): PROBNP in the last 8760 hours.  CBG: No results for input(s): GLUCAP in the last 168 hours.     Signed:  Irine Seal MD.  [facility] 02/04/2018, 3:44 PM

## 2018-02-04 NOTE — Progress Notes (Signed)
Patient converted from NSR to A-fib HR in 120's lasting 15 minutes. Patient converted back to NSR. Patient resting at time of episode. VSS. NP on call notified. No new orders placed.

## 2018-02-04 NOTE — Progress Notes (Addendum)
INFECTIOUS DISEASE PROGRESS NOTE  ID: Jorge [name] is a 62 y.o. male with  Principal Problem:   Neutropenia with fever ([place]) Active Problems:   Multiple myeloma ([place])   Acute deep vein thrombosis (DVT) of femoral vein of left lower extremity (HCC)   Iron deficiency anemia due to chronic blood loss   HCAP (healthcare-associated pneumonia)   Pancytopenia (HCC)   SOB (shortness of breath)   Infection due to human metapneumovirus (hMPV)  Subjective: No complaints.   Abtx:  Anti-infectives (From admission, onward)   Start     Dose/Rate Route Frequency Ordered Stop   02/04/18 1400  ceFEPIme (MAXIPIME) 2 g in sodium chloride 0.9 % 100 mL IVPB     2 g 200 mL/hr over 30 Minutes Intravenous Every 8 hours 02/04/18 1050 02/09/18 0559   02/04/18 1200  vancomycin (VANCOCIN) IVPB 750 mg/150 ml premix     750 mg 150 mL/hr over 60 Minutes Intravenous Every 12 hours 02/04/18 0519     02/01/18 1200  vancomycin (VANCOCIN) IVPB 1000 mg/200 mL premix  Status:  Discontinued     1,000 mg 200 mL/hr over 60 Minutes Intravenous Every 12 hours 02/01/18 0313 02/04/18 0519   02/01/18 1000  famciclovir (FAMVIR) tablet 500 mg     500 mg Oral Daily 02/01/18 0258     02/01/18 0600  ceFEPIme (MAXIPIME) 1 g in sodium chloride 0.9 % 100 mL IVPB  Status:  Discontinued     1 g 200 mL/hr over 30 Minutes Intravenous Every 8 hours 02/01/18 0258 02/04/18 1050   01/31/18 2241  ceFEPIme (MAXIPIME) 1 g injection    Note to Pharmacy:  Shellee Milo   : cabinet override      01/31/18 2241 01/31/18 2245   01/31/18 2215  ceFEPIme (MAXIPIME) 1 g in sodium chloride 0.9 % 100 mL IVPB     1 g 200 mL/hr over 30 Minutes Intravenous  Once 01/31/18 2207 01/31/18 2321   01/31/18 2215  vancomycin (VANCOCIN) IVPB 1000 mg/200 mL premix     1,000 mg 200 mL/hr over 60 Minutes Intravenous  Once 01/31/18 2207 02/01/18 0842      Medications:  Scheduled: . albuterol  2.5 mg Nebulization BID  . famciclovir  500 mg Oral Daily  .  guaiFENesin  1,200 mg Oral BID  . magnesium oxide  400 mg Oral Daily  . potassium chloride  40 mEq Oral Q4H  . rivaroxaban  10 mg Oral QPM  . sertraline  100 mg Oral QHS    Objective: Vital signs in last 24 hours: Temp:  [98 F (36.7 C)-98.9 F (37.2 C)] 98 F (36.7 C) (07/25 1213) Pulse Rate:  [77-88] 82 (07/25 1213) Resp:  [17-20] 17 (07/25 0900) BP: (104-124)/(57-83) 120/75 (07/25 1213) SpO2:  [92 %-98 %] 97 % (07/25 1213) Weight:  [77 kg (169 lb 11.2 oz)] 77 kg (169 lb 11.2 oz) (07/25 0438)   General appearance: alert, cooperative and no distress Resp: clear to auscultation bilaterally Cardio: regular rate and rhythm GI: normal findings: bowel sounds normal and soft, non-tender  Lab Results Recent Labs    02/03/18 0542 02/04/18 0509  WBC 1.4* 4.2  HGB 8.7* 9.2*  HCT 29.2* 30.9*  NA 136 138  K 3.4* 3.0*  CL 100 101  CO2 26 27  BUN 9 12  CREATININE 0.78 0.78   Liver Panel No results for input(s): PROT, ALBUMIN, AST, ALT, ALKPHOS, BILITOT, BILIDIR, IBILI in the last 72 hours. Sedimentation Rate No results  for input(s): ESRSEDRATE in the last 72 hours. C-Reactive Protein No results for input(s): CRP in the last 72 hours.  Microbiology: Recent Results (from the past 240 hour(s))  Culture, blood (routine x 2) Call MD if unable to obtain prior to antibiotics being given     Status: None (Preliminary result)   Collection Time: 01/31/18 10:37 PM  Result Value Ref Range Status   Specimen Description   Final    BLOOD LEFT ANTECUBITAL Performed at [facility], [address] [place], [place] [address]    Special Requests   Final    BOTTLES DRAWN AEROBIC AND ANAEROBIC Blood Culture results may not be optimal due to an inadequate volume of blood received in culture bottles Performed at [facility], [address] [place], [place] [address]    Culture   Final    NO GROWTH 2 DAYS Performed at [facility], [place] [address],  [place], [place] [address]    Report Status PENDING  Incomplete  Culture, blood (routine x 2) Call MD if unable to obtain prior to antibiotics being given     Status: None (Preliminary result)   Collection Time: 01/31/18 10:37 PM  Result Value Ref Range Status   Specimen Description   Final    BLOOD RIGHT ANTECUBITAL Performed at [facility], [address] [place], [place] [address]    Special Requests   Final    BOTTLES DRAWN AEROBIC AND ANAEROBIC Blood Culture results may not be optimal due to an inadequate volume of blood received in culture bottles Performed at [facility], [address] [place], [place] [address]    Culture   Final    NO GROWTH 2 DAYS Performed at [facility], [place] [address] [place], [place] [address]    Report Status PENDING  Incomplete  Culture, Urine     Status: None   Collection Time: 02/02/18  1:05 PM  Result Value Ref Range Status   Specimen Description   Final    URINE, CLEAN CATCH Performed at [facility], [place] [address], [place], [place] [address]    Special Requests   Final    NONE Performed at [facility], [place] [address] [place], [place] [address]    Culture   Final    NO GROWTH Performed at [facility], [place] [address] [place], [place] [address]    Report Status 02/03/2018 FINAL  Final  Respiratory Panel by PCR     Status: Abnormal   Collection Time: 02/03/18 12:26 PM  Result Value Ref Range Status   Adenovirus NOT DETECTED NOT DETECTED Final   Coronavirus 229E NOT DETECTED NOT DETECTED Final   Coronavirus HKU1 NOT DETECTED NOT DETECTED Final   Coronavirus NL63 NOT DETECTED NOT DETECTED Final   Coronavirus OC43 NOT DETECTED NOT DETECTED Final   Metapneumovirus DETECTED (A) NOT DETECTED Final   Rhinovirus / Enterovirus NOT DETECTED NOT DETECTED Final   Influenza A NOT DETECTED NOT DETECTED Final   Influenza B NOT DETECTED NOT DETECTED Final   Parainfluenza  Virus 1 NOT DETECTED NOT DETECTED Final   Parainfluenza Virus 2 NOT DETECTED NOT DETECTED Final   Parainfluenza Virus 3 NOT DETECTED NOT DETECTED Final   Parainfluenza Virus 4 NOT DETECTED NOT DETECTED Final   Respiratory Syncytial Virus NOT DETECTED NOT DETECTED Final   Bordetella pertussis NOT DETECTED NOT DETECTED Final  Chlamydophila pneumoniae NOT DETECTED NOT DETECTED Final   Mycoplasma pneumoniae NOT DETECTED NOT DETECTED Final    Comment: Performed at [facility], [place] [address], [place], [place] [address]  Culture, sputum-assessment     Status: None   Collection Time: 02/04/18  2:37 AM  Result Value Ref Range Status   Specimen Description SPUTUM  Final   Special Requests NONE  Final   Sputum evaluation   Final    THIS SPECIMEN IS ACCEPTABLE FOR SPUTUM CULTURE Performed at [facility], [place] [address], [place], [place] [address]    Report Status 02/04/2018 FINAL  Final    Studies/Results: Dg Chest 2 View  Result Date: 02/02/2018 CLINICAL DATA:  Follow-up pneumonia. Shortness of breath. Coughing. EXAM: CHEST - 2 VIEW COMPARISON:  01/31/2018. FINDINGS: Mediastinum hilar structures are normal. Heart size normal. No evidence of infiltrate right upper lobe on today's exam. Persistent right base subsegmental atelectasis. Small right pleural effusion. Elevation right hemidiaphragm. Mild left base subsegmental atelectasis/infiltrate. No pneumothorax. Sliding hiatal hernia. Multiple thoracic and lumbar spine vertebroplasties. IMPRESSION: 1. No evidence of infiltrate right upper lung noted on today's exam. Right lung base subsegmental atelectasis and small right pleural effusion noted. Left lung base subsegmental atelectasis/infiltrate noted on today's exam. 2.  Prominent sliding hiatal hernia. Electronically Signed   By: Marcello Moores  Register   On: 02/02/2018 14:55     Assessment/Plan: Neutropenic fever Multiple Myeloma  Total days of antibiotics: 4  vanco/cefepime  Respiratory panel- metapneumovirus  BCx are ngtd Fungal serologies pending (histo -) ANC now normal (2900), afebrile Will stop vanco If he feels well, continues to be afebrile, can be d/c later today or in AM on augmentin to complete 7 days. No allergies.  D/i pt and Dr Grandville Silos. Available as needed.           Bobby Rumpf MD, FACP Infectious Diseases (pager) [phone] [email or web address] 02/04/2018, 12:47 PM  LOS: 4 days

## 2018-02-04 NOTE — Progress Notes (Signed)
[name] is feeling better.  He has negative cultures.  I think he still might be on IV antibiotics.  The Neupogen helped his white blood cell count.  His white blood cell count today is 4.  As such, he has marrow suppression probably from the Revlimid.  I think we are probably going to have to stop the Revlimid.  He has been on it for a while.  I will have to talk to the transplant doctors at [facility] as to what they would recommend.  He has had no nausea or vomiting.  He says he ate better yesterday.  He is up and out of bed.  I he is not wearing any supplemental oxygen.[name]  His blood count today shows a white cell count of 4.2.  Hemoglobin 9.2.  Platelet count 144.  I will stop the Neupogen.  He is out of bed.  He is sitting up.  He has had no obvious fever.  His temperature today is 98.3.  Pulse 82.  Blood pressure 104/57.  His lungs sound better.  He has good breath sounds bilaterally.  Cardiac exam regular rate and rhythm.  Abdomen is soft.  I think he is a point that the Neupogen got his white cell count up after 1 dose. we it is not assures me that myeloma that is causing his white cell count to be low.  I think it is the Revlimid.  Maybe, he will be able to go home today on oral antibiotics.  He would like this.  Lattie Haw, MD Psalm 34:4

## 2018-02-04 NOTE — Progress Notes (Signed)
Pharmacy Antibiotic Note  Jorge [name] is a 62 y.o. male admitted on 01/31/2018 with  history of multiple myeloma who presents with shortness of breath.  He states he has had a cough for about the last week. Pharmacy has been consulted for vancomycini for pneumonia.  Plan: D4 full abx Cefepime 1gm IV q8h per MD Will decrease Vancomycin to 750 mg IV q12h for est AUC = 423 Goal AUC = 400-500 F/u scr/cultures/levels  ID consult: Fungal serology - if cont fever add Fluconazole CXR clear, possible mycosis/organ involvement (bone marrow), but counts stable, consider BMB if fever returns - ANC decreased further to 700 today, WBC 1.4, Filgrastim added per Oncology  Height: _0  (170.2 cm) Weight: 169 lb 11.2 oz (77 kg) IBW/kg (Calculated) : 66.1  Temp (24hrs), Avg:98.3 F (36.8 C), Min:98 F (36.7 C), Max:98.9 F (37.2 C)  Recent Labs  Lab 01/31/18 2128 01/31/18 2229 02/01/18 0516 02/02/18 0527 02/03/18 0542 02/03/18 1430 02/03/18 2329  WBC 2.2*  --  2.0* 1.5* 1.4*  --   --   CREATININE 0.90  --  0.89 0.78 0.78  --   --   LATICACIDVEN  --  1.03  --   --   --   --   --   VANCOTROUGH  --   --   --   --   --   --  10*  VANCOPEAK  --   --   --   --   --  40  --     Estimated Creatinine Clearance: 90.7 mL/min (by C-G formula based on SCr of 0.78 mg/dL).    No Known Allergies  Antimicrobials this admission: 7/21 cefepime >> 7/22 vanc >> PTA Famvir resumed  Dose adjustments this admission: V peak 1430 = 40 V trough 2330 = 10 Calculated AUC = 565  Microbiology results: 7/21 BCx x2: ngtd         Sputum ordered: not collected 7/22 ur strep: neg 7/22 ur legionella: neg 7/22 Histoplasma Ag: neg 7/23 UCx: ng-final 7/24 Fungal Ab panel: in process 7/24 IgG, IgA, IgM: in process  Thank you for allowing pharmacy to be a part of this patient's care.   Dorrene German 02/04/2018, 5:15 AM

## 2018-02-05 ENCOUNTER — Telehealth: Payer: Self-pay

## 2018-02-05 ENCOUNTER — Encounter: Payer: 59 | Admitting: Physical Therapy

## 2018-02-05 NOTE — Telephone Encounter (Signed)
02/05/18   Transition Care Management Follow-up Telephone Call  ADMISSION DATE: 01/31/18 DISCHARGE DATE: 02/04/18  How have you been since you were released from the hospital? Feeling better yet still has cough.   Do you understand why you were in the hospital? Yes   Do you understand the discharge instrcutions? Yes    Items Reviewed: Medications reviewed: Yes  Allergies reviewed: NKDA   Dietary changes reviewed: Regular diet   Referrals reviewed: Appointment scheduled for follow up.   Functional Questionnaire:   Activities of Daily Living (ADLs): All done independently  Any patient concerns? None  at this time   Confirmed importance and date/time of follow-up visits scheduled: Yes   Confirmed with patient if condition begins to worsen call PCP or go to the ER. Yes    Patient was given the office number and encouragred to call back with questions or concerns. Yes

## 2018-02-05 NOTE — Telephone Encounter (Signed)
Called patietn to schedule TCM hospital follow up. Left message for return call.

## 2018-02-06 LAB — CULTURE, RESPIRATORY: CULTURE: NORMAL

## 2018-02-06 LAB — FUNGAL ANTIBODIES PANEL, ID-BLOOD
ASPERGILLUS FLAVUS: NEGATIVE
ASPERGILLUS NIGER: NEGATIVE
Aspergillus fumigatus, IgG: NEGATIVE
BLASTOMYCES ABS, QN, DID: NEGATIVE
Histoplasma Ab, Immunodiffusion: NEGATIVE

## 2018-02-06 LAB — CULTURE, BLOOD (ROUTINE X 2)
CULTURE: NO GROWTH
Culture: NO GROWTH

## 2018-02-06 LAB — CULTURE, RESPIRATORY W GRAM STAIN

## 2018-02-08 ENCOUNTER — Other Ambulatory Visit: Payer: Self-pay | Admitting: Hematology & Oncology

## 2018-02-08 DIAGNOSIS — D472 Monoclonal gammopathy: Secondary | ICD-10-CM

## 2018-02-08 DIAGNOSIS — M545 Low back pain, unspecified: Secondary | ICD-10-CM

## 2018-02-08 DIAGNOSIS — G8929 Other chronic pain: Secondary | ICD-10-CM

## 2018-02-08 DIAGNOSIS — C9000 Multiple myeloma not having achieved remission: Secondary | ICD-10-CM

## 2018-02-08 DIAGNOSIS — I8290 Acute embolism and thrombosis of unspecified vein: Secondary | ICD-10-CM

## 2018-02-08 DIAGNOSIS — M7989 Other specified soft tissue disorders: Secondary | ICD-10-CM

## 2018-02-09 ENCOUNTER — Ambulatory Visit: Payer: 59 | Admitting: Physical Therapy

## 2018-02-09 ENCOUNTER — Encounter: Payer: Self-pay | Admitting: Physical Therapy

## 2018-02-09 DIAGNOSIS — C9000 Multiple myeloma not having achieved remission: Secondary | ICD-10-CM | POA: Insufficient documentation

## 2018-02-09 DIAGNOSIS — M25611 Stiffness of right shoulder, not elsewhere classified: Secondary | ICD-10-CM

## 2018-02-09 DIAGNOSIS — Z79899 Other long term (current) drug therapy: Secondary | ICD-10-CM | POA: Diagnosis not present

## 2018-02-09 DIAGNOSIS — M25511 Pain in right shoulder: Secondary | ICD-10-CM

## 2018-02-09 DIAGNOSIS — Z7901 Long term (current) use of anticoagulants: Secondary | ICD-10-CM | POA: Insufficient documentation

## 2018-02-09 DIAGNOSIS — M6281 Muscle weakness (generalized): Secondary | ICD-10-CM

## 2018-02-09 DIAGNOSIS — R293 Abnormal posture: Secondary | ICD-10-CM

## 2018-02-09 DIAGNOSIS — R252 Cramp and spasm: Secondary | ICD-10-CM

## 2018-02-09 NOTE — Therapy (Addendum)
[facility] [address]  [place], [place], [address] Phone: [phone]   Fax:  [phone]  Physical Therapy Treatment Progress Note Reporting Period: 12/16/2017 to 02/09/2018  See note below for Objective Data and Assessment of Progress/Goals.      Patient Details  Name: Jorge [name] MRN: [id number] Date of Birth: 1956/05/02 Referring Provider: Elsie Saas, MD   Encounter Date: 02/09/2018  PT End of Session - 02/09/18 1200    Visit Number  9    Number of Visits  12    Date for PT Re-Evaluation  02/23/18    Authorization Type  [employer]    PT Start Time  1102    PT Stop Time  1146    PT Time Calculation (min)  44 min    Activity Tolerance  Patient tolerated treatment well    Behavior During Therapy  [facility] for tasks assessed/performed       Past Medical History:  Diagnosis Date  . Anxiety   . Bone metastasis ([place])   . Chest cold 05/19/2016   productive cough  -- started on antibiotic  . Chronic back pain    due to bone mets from myeloma  . Cough   . Depression   . GERD (gastroesophageal reflux disease)   . Hiatal hernia   . History of chicken pox   . History of concussion    age 10 -- no residual  . History of DVT of lower extremity 03/21/2016  treated and completed w/ xarelto   per doppler left extensive occlusion common femoral, femoral, and popliteal veins and right partial occlusion common femoral and profunda femoral veins/  last doppler 06-04-2016 no evidence acute or chronic dvt noted either leg   . History of radiation therapy 06/09/16-06/23/16   lower thoracic spine 25 Gy in 10 fractions  . Mouth ulcers    secondary to radiation  . Multiple myeloma ([place]) dx 02/22/2016 via bone marrow bx---  oncologist-  [name]   IgG Kappa-- Hyperdiploid/ +11 w/ bone mets--  current treatment chemotherapy (started 08/ 2017)and pallitive radiation to back started 06-09-2016  . Renal calculus, right   .  Wears contact lenses     Past Surgical History:  Procedure Laterality Date  . COLONOSCOPY  [id number]  . CYSTOSCOPY W/ URETERAL STENT PLACEMENT Right 06/20/2016   Procedure: CYSTOSCOPY WITH STENT REPLACEMENT;  Surgeon: Kathie Rhodes, MD;  Location: [facility];  Service: Urology;  Laterality: Right;  . CYSTOSCOPY WITH RETROGRADE PYELOGRAM, URETEROSCOPY AND STENT PLACEMENT Right 05/30/2016   Procedure: CYSTOSCOPY WITH RETROGRADE PYELOGRAM, URETEROSCOPY AND STENT PLACEMENT,DILITATION URETERAL STRICTURE;  Surgeon: Kathie Rhodes, MD;  Location: [facility];  Service: Urology;  Laterality: Right;  . CYSTOSCOPY/RETROGRADE/URETEROSCOPY/STONE EXTRACTION WITH BASKET Right 06/20/2016   Procedure: CYSTOSCOPY/URETEROSCOPY/STONE EXTRACTION WITH BASKET;  Surgeon: Kathie Rhodes, MD;  Location: [facility];  Service: Urology;  Laterality: Right;  . HOLMIUM LASER APPLICATION Right 62/02/3661   Procedure: HOLMIUM LASER APPLICATION;  Surgeon: Kathie Rhodes, MD;  Location: [facility];  Service: Urology;  Laterality: Right;  . IR GENERIC HISTORICAL  02/11/2016   IR RADIOLOGIST EVAL & MGMT 02/11/2016 [facility]  . IR GENERIC HISTORICAL  02/15/2016   IR BONE TUMOR(S)RF ABLATION 02/15/2016 Luanne Bras, MD [facility]  . IR GENERIC HISTORICAL  02/15/2016   IR BONE TUMOR(S)RF ABLATION 02/15/2016 Luanne Bras, MD [facility]  . IR GENERIC HISTORICAL  02/15/2016   IR BONE TUMOR(S)RF ABLATION 02/15/2016 Sanjeev  Estanislado Pandy, MD [facility]  . IR GENERIC HISTORICAL  02/15/2016   IR KYPHO THORACIC WITH BONE BIOPSY 02/15/2016 Luanne Bras, MD [facility]  . IR GENERIC HISTORICAL  02/15/2016   IR KYPHO THORACIC WITH BONE BIOPSY 02/15/2016 Luanne Bras, MD [facility]  . IR GENERIC HISTORICAL  02/15/2016   IR VERTEBROPLASTY CERV/THOR BX INC UNI/BIL INC/INJECT/IMAGING 02/15/2016 Luanne Bras, MD [facility]  . IR GENERIC HISTORICAL  03/13/2016   IR KYPHO EA ADDL LEVEL THORACIC  OR LUMBAR 03/13/2016 Luanne Bras, MD [facility]  . IR GENERIC HISTORICAL  03/13/2016   IR KYPHO EA ADDL LEVEL THORACIC OR LUMBAR 03/13/2016 Luanne Bras, MD [facility]  . IR GENERIC HISTORICAL  03/13/2016   IR BONE TUMOR(S)RF ABLATION 03/13/2016 Luanne Bras, MD [facility]  . IR GENERIC HISTORICAL  03/13/2016   IR KYPHO LUMBAR INC FX REDUCE BONE BX UNI/BIL CANNULATION INC/IMAGING 03/13/2016 Luanne Bras, MD [facility]  . IR GENERIC HISTORICAL  03/13/2016   IR BONE TUMOR(S)RF ABLATION 03/13/2016 Luanne Bras, MD [facility]  . IR GENERIC HISTORICAL  03/13/2016   IR BONE TUMOR(S)RF ABLATION 03/13/2016 Luanne Bras, MD [facility]  . IR GENERIC HISTORICAL  03/31/2016   IR RADIOLOGIST EVAL & MGMT 03/31/2016 [facility]  . LAPAROSCOPIC INGUINAL HERNIA REPAIR Bilateral 12-16-2013  dr gross  . RADIOLOGY WITH ANESTHESIA N/A 02/15/2016   Procedure: Spinal Ablation;  Surgeon: Luanne Bras, MD;  Location: [facility];  Service: Radiology;  Laterality: N/A;  . RADIOLOGY WITH ANESTHESIA N/A 03/13/2016   Procedure: LUMBER ABLATION;  Surgeon: Luanne Bras, MD;  Location: [facility];  Service: Radiology;  Laterality: N/A;  . ROTATOR CUFF REPAIR Right 2003  . TONSILLECTOMY  age 76  . WISDOM TOOTH EXTRACTION      There were no vitals filed for this visit.  Subjective Assessment - 02/09/18 1201    Subjective  Pt reports that he is doing well today, and feels a lot better than he has in the last week, but still feels weak from recent hospitalization for pneumonia. Was typing a lot yesterday writing a blog post and experienced pain in the R shoulder.     Limitations  House hold activities;Lifting    Diagnostic tests  X-ray completed in MD office revealed some OA in R shoulder    Patient Stated Goals  "to have more movement in my arm, get rid of most of the pain, and start strengthening my shoulder"    Currently in Pain?  No/denies         [facility] PT Assessment - 02/09/18  0001      Strength   Right/Left Shoulder  Right    Right Shoulder Flexion  4+/5    Right Shoulder ABduction  4+/5    Right Shoulder Internal Rotation  4+/5    Right Shoulder External Rotation  4+/5                   [facility] Adult PT Treatment/Exercise - 02/09/18 0001      Shoulder Exercises: Sidelying   External Rotation  --    Theraband Level (Shoulder External Rotation)  --    Internal Rotation  --      Shoulder Exercises: Standing   External Rotation  10 reps;Right;Strengthening    Theraband Level (Shoulder External Rotation)  Level 1 (Yellow)    Internal Rotation  10 reps;Right;Theraband;Strengthening    Theraband Level (Shoulder Internal Rotation)  Level 1 (Yellow)    Extension  10 reps;Both;Strengthening  Theraband Level (Shoulder Extension)  Level 1 (Yellow)    Retraction  Both;15 reps;Limitations    Retraction Limitations  With pool noodle down spine for EC for scapular retraction and depression    Other Standing Exercises  Wall clocks; 10 reps with each UE to 3 positions with Red TB around wrists.     Other Standing Exercises  Shoulder extension holding beach ball behind hips. Shoulder extension to lift the ball behind pt then internal rotation to lift ball up toward shoulder blades; 10 reps      Shoulder Exercises: ROM/Strengthening   UBE (Upper Arm Bike)  L2 x 6 min (3 min forward/3 min backward)    Lat Pull  Other (comment)    Lat Pull Limitations  8 reps with 15#; increased fatigue with exercise      Shoulder Exercises: Stretch   Corner Stretch  30 seconds;3 reps    Corner Stretch Limitations  2 way doorway stretch; upper position and middle position done today      Manual Therapy   Manual Therapy  Soft tissue mobilization;Myofascial release    Manual therapy comments  Supine; PT supported pt shoulder into 90 degrees of abduction.     Soft tissue mobilization  R biceps and pec major, increased tension and trigger points    Myofascial Release  Manual  TPR to pec major and bicep musculature. Good reponse with less tension noted after release               PT Short Term Goals - 12/24/17 1147      PT SHORT TERM GOAL #1   Title  independent with initial HEP    Status  Achieved        PT Long Term Goals - 02/09/18 1111      PT LONG TERM GOAL #1   Title  Independent with ongoing HEP +/- gym program     Status  Achieved    Target Date  --      PT LONG TERM GOAL #2   Title  R shoulder AROM equivalent to L shoulder w/o pain provocation     Baseline  02/09/2018) Pain and limitation in ROM with IR    Status  On-going    Target Date  02/23/18      PT LONG TERM GOAL #3   Title  R shoulder strength >/= 4+/5 w/o pain on resistance    Status  Achieved      PT LONG TERM GOAL #4   Title  Patient to report ability to perform daily activities including ADLs, reaching and light lifting with R arm w/o pain provocation    Baseline  Pain with typing    Status  On-going    Target Date  02/23/18      PT LONG TERM GOAL #5   Title  Patient to return to working out w/o limitation due to R shoulder pain    Status  On-going    Target Date  02/23/18            Plan - 02/09/18 1207    Clinical Impression Statement  Pt tolerated treatment well today with increased fatigue with exercises secondary to deconditioning from recent hospitalization and bedrest. Even so, patient has made good progress through this episode of therapy and has partially met some of his long-term goals including UE strength goal. He continues to have pain with typing and activities of daily living including lifting overhead, and will continue to benefit from  physical therapy for remaining visits in POC to improve functional ability and progress toward pt goals. During manual therapy performed during today's session noticed increased tightness and restriction in biceps and pec musculature that responded well to STM.     Rehab Potential  Good    PT  Treatment/Interventions  Patient/family education;ADLs/Self Care Home Management;Therapeutic exercise;Therapeutic activities;Neuromuscular re-education;Manual techniques;Passive range of motion;Dry needling;Taping;Electrical Stimulation;Moist Heat;Cryotherapy;Vasopneumatic Device;Iontophoresis 61m/ml Dexamethasone    Consulted and Agree with Plan of Care  Patient       Patient will benefit from skilled therapeutic intervention in order to improve the following deficits and impairments:  Pain, Postural dysfunction, Decreased range of motion, Decreased strength, Increased muscle spasms, Impaired flexibility, Impaired UE functional use, Decreased activity tolerance, Abnormal gait  Visit Diagnosis: Acute pain of right shoulder  Stiffness of right shoulder, not elsewhere classified  Abnormal posture  Muscle weakness (generalized)  Cramp and spasm     Problem List Patient Active Problem List   Diagnosis Date Noted  . Infection due to human metapneumovirus (hMPV) 02/04/2018  . Neutropenia with fever ([place] 02/02/2018  . SOB (shortness of breath)   . Pancytopenia ([place] 02/01/2018  . Bacteremia 11/10/2017  . Sepsis ([place] 11/09/2017  . HCAP (healthcare-associated pneumonia) 11/09/2017  . Thrombocytopenia ([place] 11/09/2017  . Iron deficiency anemia due to chronic blood loss   . Neutropenia ([place] 03/02/2017  . Cellulitis 03/01/2017  . Phlebitis or thrombophlebitis of lower extremity 07/21/2016  . Visit for preventive health examination 05/16/2016  . Aphthous ulcer 05/16/2016  . Ureterolithiasis 05/16/2016  . Acute deep vein thrombosis (DVT) of femoral vein of left lower extremity ([place] 03/21/2016  . Multiple myeloma ([place] 02/22/2016  . Multiple myeloma not having achieved remission ([place] 02/22/2016  . Bilateral inguinal hernia (BIH), s/p lap repair 12/16/2013 11/02/2013    JShirline Frees SPT 02/09/2018, 1:06 PM  [facility] [place] [address]Phone: [phone]  Fax:  [phone] Name: MGlenard KeeslingMRN: [id number] of Birth: 8Mar 11, 1957

## 2018-02-10 ENCOUNTER — Other Ambulatory Visit: Payer: Self-pay

## 2018-02-10 ENCOUNTER — Inpatient Hospital Stay (HOSPITAL_BASED_OUTPATIENT_CLINIC_OR_DEPARTMENT_OTHER): Payer: 59 | Admitting: Hematology & Oncology

## 2018-02-10 ENCOUNTER — Telehealth: Payer: Self-pay | Admitting: *Deleted

## 2018-02-10 ENCOUNTER — Inpatient Hospital Stay: Payer: 59 | Attending: Hematology & Oncology

## 2018-02-10 VITALS — BP 110/65 | HR 75 | Temp 97.9°F | Resp 20 | Wt 172.5 lb

## 2018-02-10 DIAGNOSIS — Z79899 Other long term (current) drug therapy: Secondary | ICD-10-CM

## 2018-02-10 DIAGNOSIS — D500 Iron deficiency anemia secondary to blood loss (chronic): Secondary | ICD-10-CM

## 2018-02-10 DIAGNOSIS — C9000 Multiple myeloma not having achieved remission: Secondary | ICD-10-CM | POA: Diagnosis not present

## 2018-02-10 DIAGNOSIS — Z7901 Long term (current) use of anticoagulants: Secondary | ICD-10-CM | POA: Diagnosis not present

## 2018-02-10 LAB — CMP (CANCER CENTER ONLY)
ALBUMIN: 3.5 g/dL (ref 3.5–5.0)
ALK PHOS: 56 U/L (ref 26–84)
ALT: 42 U/L (ref 10–47)
AST: 36 U/L (ref 11–38)
Anion gap: 9 (ref 5–15)
BUN: 15 mg/dL (ref 7–22)
CO2: 27 mmol/L (ref 18–33)
CREATININE: 0.7 mg/dL (ref 0.60–1.20)
Calcium: 9.5 mg/dL (ref 8.0–10.3)
Chloride: 104 mmol/L (ref 98–108)
GLUCOSE: 99 mg/dL (ref 73–118)
POTASSIUM: 4.5 mmol/L (ref 3.3–4.7)
SODIUM: 140 mmol/L (ref 128–145)
TOTAL PROTEIN: 6.8 g/dL (ref 6.4–8.1)
Total Bilirubin: 0.5 mg/dL (ref 0.2–1.6)

## 2018-02-10 LAB — CBC WITH DIFFERENTIAL (CANCER CENTER ONLY)
Basophils Absolute: 0 10*3/uL (ref 0.0–0.1)
Basophils Relative: 1 %
EOS PCT: 4 %
Eosinophils Absolute: 0.1 10*3/uL (ref 0.0–0.5)
HEMATOCRIT: 30.1 % — AB (ref 38.7–49.9)
Hemoglobin: 8.8 g/dL — ABNORMAL LOW (ref 13.0–17.1)
LYMPHS ABS: 0.9 10*3/uL (ref 0.9–3.3)
LYMPHS PCT: 46 %
MCH: 25 pg — AB (ref 28.0–33.4)
MCHC: 29.2 g/dL — ABNORMAL LOW (ref 32.0–35.9)
MCV: 85.5 fL (ref 82.0–98.0)
MONO ABS: 0.4 10*3/uL (ref 0.1–0.9)
Monocytes Relative: 20 %
NEUTROS ABS: 0.6 10*3/uL — AB (ref 1.5–6.5)
Neutrophils Relative %: 29 %
PLATELETS: 238 10*3/uL (ref 145–400)
RBC: 3.52 MIL/uL — AB (ref 4.20–5.70)
RDW: 18.2 % — ABNORMAL HIGH (ref 11.1–15.7)
WBC: 2.1 10*3/uL — AB (ref 4.0–10.0)

## 2018-02-10 LAB — IRON AND TIBC
Iron: 22 ug/dL — ABNORMAL LOW (ref 42–163)
SATURATION RATIOS: 4 % — AB (ref 42–163)
TIBC: 495 ug/dL — ABNORMAL HIGH (ref 202–409)
UIBC: 473 ug/dL

## 2018-02-10 LAB — FERRITIN: Ferritin: 34 ng/mL (ref 24–336)

## 2018-02-10 LAB — MAGNESIUM: Magnesium: 2.1 mg/dL (ref 1.7–2.4)

## 2018-02-10 NOTE — Progress Notes (Signed)
Hematology and Oncology Follow Up Visit  Jorge [name] [id number] 21-Aug-1955 62 y.o. 02/10/2018   Principle Diagnosis:  IgG Kappa myeloma - Hyperdiploid/+11 DVT of the LEFT and RIGHT leg   Current Therapy:   Revlimid 10 mg po q day (21 on/7 off) - on hold,   Zometa 4 mg IV q 3 months- next dose is 12/2017 Xarelto 10 mg PO daily   Interim History:  Jorge [name] is here today with his wife for follow-up.  He was just in the hospital for another episode of pneumonia.  His immunoglobulin levels have been okay.  The problem is that he has been somewhat neutropenic.  He is been quite anemic.  We have held his Revlimid.  I have a feeling that the Revlimid is causing his problems.  He is still off Revlimid.  I would like to have him off Revlimid until after he gets back from his [place] vacation which he starts in late August.  I think he goes back to [facility] to see Dr. Laverta Baltimore in couple weeks.  His cramps are no longer present.  His back spasms are no longer present.  He did have some coughing over the weekend but this seemed to actually improve his lung function.  His last myeloma studies back in June showed an M spike of 0.1 g/dL.  His IgG level was 712 mg/dL.  His kappa light chain was 3.4 mg/dL.   Overall, his performance status is ECOG 1.   Medications:  Allergies as of 02/10/2018   No Known Allergies     Medication List        Accurate as of 02/10/18  9:14 AM. Always use your most recent med list.          acetaminophen 325 MG tablet Commonly known as:  TYLENOL Take 2 tablets (650 mg total) by mouth every 6 (six) hours as needed for mild pain (or Fever >/= 101).   albuterol 108 (90 Base) MCG/ACT inhaler Commonly known as:  PROVENTIL HFA;VENTOLIN HFA Inhale 2 puffs into the lungs every 6 (six) hours as needed for wheezing or shortness of breath.   benzonatate 200 MG capsule Commonly known as:  TESSALON Take 1 capsule (200 mg total) by mouth 3 (three) times daily as  needed for cough.   cetirizine 10 MG tablet Commonly known as:  ZYRTEC Take 10 mg by mouth daily as needed for allergies.   cholecalciferol 1000 units tablet Commonly known as:  VITAMIN D Take 1,000 Units by mouth daily.   famciclovir 500 MG tablet Commonly known as:  FAMVIR Take 1 tablet (500 mg total) by mouth daily.   lidocaine 5 % Commonly known as:  LIDODERM PLAEC 1 PATCH ONTO THE SKIN DAILY as needed for pain. APPLY 1 PATCH TO THE MOST PAINFUL AREA FOR 12 HR IN A 24 HR PERIOD   magnesium oxide 400 MG tablet Commonly known as:  MAG-OX Take 400 mg by mouth daily.   ondansetron 8 MG disintegrating tablet Commonly known as:  ZOFRAN-ODT Take 1 tablet (8 mg total) by mouth every 8 (eight) hours as needed.   OVER THE COUNTER MEDICATION Take 1 tablet by mouth daily.   polyethylene glycol powder powder Commonly known as:  MIRALAX Take 17 g by mouth daily.   PROBIOTIC PO Take 1 capsule by mouth daily.   rOPINIRole 0.25 MG tablet Commonly known as:  REQUIP TAKE 1 TABLET BY MOUTH 1 TO 3 HOURS BEFORE BED FOR 2 DAYS THEN INCREASE TO 2  TABS   senna 8.6 MG Tabs tablet Commonly known as:  SENOKOT Take 2 tablets (17.2 mg total) by mouth daily.   sertraline 100 MG tablet Commonly known as:  ZOLOFT Take 100 mg by mouth at bedtime.   XARELTO 10 MG Tabs tablet Generic drug:  rivaroxaban Take 1 tablet (10 mg total) by mouth daily.   zolpidem 10 MG tablet Commonly known as:  AMBIEN Take 1 tablet (10 mg total) by mouth at bedtime as needed.   ZOMETA IV Inject 4 mg into the vein every 3 (three) months. Receives at [name] office.       Allergies: No Known Allergies  Past Medical History, Surgical history, Social history, and Family History were reviewed and updated.  Review of Systems: Review of Systems  Constitutional: Negative.   HENT: Negative.   Eyes: Negative.   Respiratory: Negative.   Cardiovascular: Negative.   Gastrointestinal: Negative.     Genitourinary: Negative.   Musculoskeletal: Positive for myalgias.  Skin: Negative.   Neurological: Negative.   Endo/Heme/Allergies: Negative.   Psychiatric/Behavioral: Negative.      Physical Exam:  weight is 172 lb 8 oz (78.2 kg). His oral temperature is 97.9 F (36.6 C). His blood pressure is 110/65 and his pulse is 75. His respiration is 20 and oxygen saturation is 100%.   Wt Readings from Last 3 Encounters:  02/10/18 172 lb 8 oz (78.2 kg)  02/04/18 169 lb 11.2 oz (77 kg)  01/18/18 178 lb (80.7 kg)    Physical Exam  Constitutional: He is oriented to person, place, and time.  HENT:  Head: Normocephalic and atraumatic.  Mouth/Throat: Oropharynx is clear and moist.  Eyes: Pupils are equal, round, and reactive to light. EOM are normal.  Neck: Normal range of motion.  Cardiovascular: Normal rate, regular rhythm and normal heart sounds.  Pulmonary/Chest: Effort normal and breath sounds normal.  Abdominal: Soft. Bowel sounds are normal.  Musculoskeletal: Normal range of motion. He exhibits no edema, tenderness or deformity.  Lymphadenopathy:    He has no cervical adenopathy.  Neurological: He is alert and oriented to person, place, and time.  Skin: Skin is warm and dry. No rash noted. No erythema.  Psychiatric: He has a normal mood and affect. His behavior is normal. Judgment and thought content normal.  Vitals reviewed.    Lab Results  Component Value Date   WBC 2.1 (L) 02/10/2018   HGB 8.8 (L) 02/10/2018   HCT 30.1 (L) 02/10/2018   MCV 85.5 02/10/2018   PLT 238 02/10/2018   Lab Results  Component Value Date   FERRITIN 37 08/18/2017   IRON 106 08/18/2017   TIBC 366 08/18/2017   UIBC 260 08/18/2017   IRONPCTSAT 29 (L) 08/18/2017   Lab Results  Component Value Date   RETICCTPCT 1.1 03/05/2017   RBC 3.52 (L) 02/10/2018   Lab Results  Component Value Date   KPAFRELGTCHN 33.6 (H) 01/05/2018   LAMBDASER 15.4 01/05/2018   KAPLAMBRATIO 2.18 (H) 01/05/2018    Lab Results  Component Value Date   IGGSERUM 712 02/03/2018   IGA 194 02/03/2018   IGMSERUM 21 02/03/2018   Lab Results  Component Value Date   TOTALPROTELP 6.0 01/05/2018   ALBUMINELP 3.7 01/05/2018   A1GS 0.2 01/05/2018   A2GS 0.5 01/05/2018   BETS 1.0 01/05/2018   GAMS 0.6 01/05/2018   MSPIKE 0.1 (H) 01/05/2018   SPEI Comment 01/05/2018     Chemistry      Component Value Date/Time  NA 140 02/10/2018 0808   NA 143 06/30/2017 1049   NA 140 10/03/2016 0845   K 4.5 02/10/2018 0808   K 4.0 06/30/2017 1049   K 4.3 10/03/2016 0845   CL 104 02/10/2018 0808   CL 105 06/30/2017 1049   CO2 27 02/10/2018 0808   CO2 27 06/30/2017 1049   CO2 26 10/03/2016 0845   BUN 15 02/10/2018 0808   BUN 13 06/30/2017 1049   BUN 11.9 10/03/2016 0845   CREATININE 0.70 02/10/2018 0808   CREATININE 1.0 06/30/2017 1049   CREATININE 0.9 10/03/2016 0845      Component Value Date/Time   CALCIUM 9.5 02/10/2018 0808   CALCIUM 9.0 06/30/2017 1049   CALCIUM 9.9 10/03/2016 0845   ALKPHOS 56 02/10/2018 0808   ALKPHOS 57 06/30/2017 1049   ALKPHOS 80 10/03/2016 0845   AST 36 02/10/2018 0808   AST 28 10/03/2016 0845   ALT 42 02/10/2018 0808   ALT 43 06/30/2017 1049   ALT 26 10/03/2016 0845   BILITOT 0.5 02/10/2018 0808   BILITOT 0.48 10/03/2016 0845      Impression and Plan: Jorge [name] is a pleasant 62 yo gentleman with IgG kappa myeloma.  He ultimately underwent a autologous stem cell transplant at [facility] in February 2018.  Again, we will hold his Revlimid.  I will call Dr. Laverta Baltimore.  We will have to see what happens when he goes out to [facility] in a couple weeks.  I think that restarting any maintenance therapy will really be dependent upon his M spike.  He comes back in 3 weeks.  If his white cell count is still low at that time, I will give him a dose of Neulasta so that his white cell count is adequate for his trip to [place].[name]   [name], MD 7/31/20199:14 AM

## 2018-02-10 NOTE — Telephone Encounter (Addendum)
Patient is aware of results and appointment made  ----- Message from [name], MD sent at 02/10/2018  1:16 PM EDT ----- Call - the iron is very low!!  Needs 2 doses of iron!! Laurey Arrow

## 2018-02-11 ENCOUNTER — Inpatient Hospital Stay: Payer: 59 | Attending: Hematology & Oncology

## 2018-02-11 ENCOUNTER — Encounter: Payer: Self-pay | Admitting: Physical Therapy

## 2018-02-11 ENCOUNTER — Other Ambulatory Visit: Payer: 59

## 2018-02-11 ENCOUNTER — Ambulatory Visit: Payer: 59 | Attending: Orthopedic Surgery | Admitting: Physical Therapy

## 2018-02-11 VITALS — BP 103/61 | HR 79 | Temp 98.4°F | Resp 20

## 2018-02-11 DIAGNOSIS — C9000 Multiple myeloma not having achieved remission: Secondary | ICD-10-CM | POA: Diagnosis not present

## 2018-02-11 DIAGNOSIS — Z7901 Long term (current) use of anticoagulants: Secondary | ICD-10-CM | POA: Insufficient documentation

## 2018-02-11 DIAGNOSIS — R293 Abnormal posture: Secondary | ICD-10-CM | POA: Insufficient documentation

## 2018-02-11 DIAGNOSIS — Z86718 Personal history of other venous thrombosis and embolism: Secondary | ICD-10-CM | POA: Insufficient documentation

## 2018-02-11 DIAGNOSIS — M25611 Stiffness of right shoulder, not elsewhere classified: Secondary | ICD-10-CM | POA: Diagnosis not present

## 2018-02-11 DIAGNOSIS — R252 Cramp and spasm: Secondary | ICD-10-CM | POA: Insufficient documentation

## 2018-02-11 DIAGNOSIS — D509 Iron deficiency anemia, unspecified: Secondary | ICD-10-CM | POA: Insufficient documentation

## 2018-02-11 DIAGNOSIS — Z79899 Other long term (current) drug therapy: Secondary | ICD-10-CM | POA: Diagnosis not present

## 2018-02-11 DIAGNOSIS — M25511 Pain in right shoulder: Secondary | ICD-10-CM | POA: Insufficient documentation

## 2018-02-11 DIAGNOSIS — M6281 Muscle weakness (generalized): Secondary | ICD-10-CM | POA: Diagnosis present

## 2018-02-11 LAB — KAPPA/LAMBDA LIGHT CHAINS
KAPPA FREE LGHT CHN: 26.8 mg/L — AB (ref 3.3–19.4)
Kappa, lambda light chain ratio: 2.05 — ABNORMAL HIGH (ref 0.26–1.65)
Lambda free light chains: 13.1 mg/L (ref 5.7–26.3)

## 2018-02-11 LAB — IGG, IGA, IGM
IGM (IMMUNOGLOBULIN M), SRM: 29 mg/dL (ref 20–172)
IgA: 230 mg/dL (ref 61–437)
IgG (Immunoglobin G), Serum: 819 mg/dL (ref 700–1600)

## 2018-02-11 MED ORDER — SODIUM CHLORIDE 0.9 % IV SOLN
Freq: Once | INTRAVENOUS | Status: AC
  Administered 2018-02-11: 14:00:00 via INTRAVENOUS
  Filled 2018-02-11: qty 250

## 2018-02-11 MED ORDER — SODIUM CHLORIDE 0.9 % IV SOLN
510.0000 mg | Freq: Once | INTRAVENOUS | Status: AC
  Administered 2018-02-11: 510 mg via INTRAVENOUS
  Filled 2018-02-11: qty 17

## 2018-02-11 NOTE — Patient Instructions (Signed)

## 2018-02-11 NOTE — Therapy (Addendum)
[facility] [address]  [place], [place], [address] Phone: [phone]   Fax:  [phone]  Physical Therapy Treatment  Patient Details  Name: Jorge [name] MRN: [id number] Date of Birth: 08-25-1955 Referring Provider: Elsie Saas, MD   Encounter Date: 02/11/2018  PT End of Session - 02/11/18 1105    Visit Number  10    Number of Visits  12    Date for PT Re-Evaluation  02/23/18    Authorization Type  UHC    Authorization - Number of Visits  60    PT Start Time  1105    PT Stop Time  1148    PT Time Calculation (min)  43 min    Activity Tolerance  Patient tolerated treatment well    Behavior During Therapy  [facility] for tasks assessed/performed       Past Medical History:  Diagnosis Date  . Anxiety   . Bone metastasis ([place])   . Chest cold 05/19/2016   productive cough  -- started on antibiotic  . Chronic back pain    due to bone mets from myeloma  . Cough   . Depression   . GERD (gastroesophageal reflux disease)   . Hiatal hernia   . History of chicken pox   . History of concussion    age 89 -- no residual  . History of DVT of lower extremity 03/21/2016  treated and completed w/ xarelto   per doppler left extensive occlusion common femoral, femoral, and popliteal veins and right partial occlusion common femoral and profunda femoral veins/  last doppler 06-04-2016 no evidence acute or chronic dvt noted either leg   . History of radiation therapy 06/09/16-06/23/16   lower thoracic spine 25 Gy in 10 fractions  . Mouth ulcers    secondary to radiation  . Multiple myeloma ([place]) dx 02/22/2016 via bone marrow bx---  oncologist-  [name]   IgG Kappa-- Hyperdiploid/ +11 w/ bone mets--  current treatment chemotherapy (started 08/ 2017)and pallitive radiation to back started 06-09-2016  . Renal calculus, right   . Wears contact lenses     Past Surgical History:  Procedure Laterality Date  . COLONOSCOPY   [id number]  . CYSTOSCOPY W/ URETERAL STENT PLACEMENT Right 06/20/2016   Procedure: CYSTOSCOPY WITH STENT REPLACEMENT;  Surgeon: Kathie Rhodes, MD;  Location: [facility];  Service: Urology;  Laterality: Right;  . CYSTOSCOPY WITH RETROGRADE PYELOGRAM, URETEROSCOPY AND STENT PLACEMENT Right 05/30/2016   Procedure: CYSTOSCOPY WITH RETROGRADE PYELOGRAM, URETEROSCOPY AND STENT PLACEMENT,DILITATION URETERAL STRICTURE;  Surgeon: Kathie Rhodes, MD;  Location: [facility];  Service: Urology;  Laterality: Right;  . CYSTOSCOPY/RETROGRADE/URETEROSCOPY/STONE EXTRACTION WITH BASKET Right 06/20/2016   Procedure: CYSTOSCOPY/URETEROSCOPY/STONE EXTRACTION WITH BASKET;  Surgeon: Kathie Rhodes, MD;  Location: [facility];  Service: Urology;  Laterality: Right;  . HOLMIUM LASER APPLICATION Right 29/03/3715   Procedure: HOLMIUM LASER APPLICATION;  Surgeon: Kathie Rhodes, MD;  [facility];  Service: Urology;  Laterality: Right;  . IR GENERIC HISTORICAL  02/11/2016   IR RADIOLOGIST EVAL & MGMT 02/11/2016 [facility]  . IR GENERIC HISTORICAL  02/15/2016   IR BONE TUMOR(S)RF ABLATION 02/15/2016 Luanne Bras, MD [facility]  . IR GENERIC HISTORICAL  02/15/2016   IR BONE TUMOR(S)RF ABLATION 02/15/2016 Luanne Bras, MD [facility]  . IR GENERIC HISTORICAL  02/15/2016   IR BONE TUMOR(S)RF ABLATION 02/15/2016 Luanne Bras, MD [facility]  . IR GENERIC HISTORICAL  02/15/2016  IR KYPHO THORACIC WITH BONE BIOPSY 02/15/2016 Luanne Bras, MD [facility]  . IR GENERIC HISTORICAL  02/15/2016   IR KYPHO THORACIC WITH BONE BIOPSY 02/15/2016 Luanne Bras, MD [facility]  . IR GENERIC HISTORICAL  02/15/2016   IR VERTEBROPLASTY CERV/THOR BX INC UNI/BIL INC/INJECT/IMAGING 02/15/2016 Luanne Bras, MD [facility]  . IR GENERIC HISTORICAL  03/13/2016   IR KYPHO EA ADDL LEVEL THORACIC OR LUMBAR 03/13/2016 Luanne Bras, MD [facility]  . IR GENERIC HISTORICAL  03/13/2016    IR KYPHO EA ADDL LEVEL THORACIC OR LUMBAR 03/13/2016 Luanne Bras, MD [facility]  . IR GENERIC HISTORICAL  03/13/2016   IR BONE TUMOR(S)RF ABLATION 03/13/2016 Luanne Bras, MD [facility]  . IR GENERIC HISTORICAL  03/13/2016   IR KYPHO LUMBAR INC FX REDUCE BONE BX UNI/BIL CANNULATION INC/IMAGING 03/13/2016 Luanne Bras, MD [facility]  . IR GENERIC HISTORICAL  03/13/2016   IR BONE TUMOR(S)RF ABLATION 03/13/2016 Luanne Bras, MD [facility]  . IR GENERIC HISTORICAL  03/13/2016   IR BONE TUMOR(S)RF ABLATION 03/13/2016 Luanne Bras, MD [facility]  . IR GENERIC HISTORICAL  03/31/2016   IR RADIOLOGIST EVAL & MGMT 03/31/2016 [facility]  . LAPAROSCOPIC INGUINAL HERNIA REPAIR Bilateral 12-16-2013  dr gross  . RADIOLOGY WITH ANESTHESIA N/A 02/15/2016   Procedure: Spinal Ablation;  Surgeon: Luanne Bras, MD;  Location: [facility];  Service: Radiology;  Laterality: N/A;  . RADIOLOGY WITH ANESTHESIA N/A 03/13/2016   Procedure: LUMBER ABLATION;  Surgeon: Luanne Bras, MD;  Location: [facility];  Service: Radiology;  Laterality: N/A;  . ROTATOR CUFF REPAIR Right 2003  . TONSILLECTOMY  age 81  . WISDOM TOOTH EXTRACTION      There were no vitals filed for this visit.  Subjective Assessment - 02/11/18 1107    Subjective  Pt reporting very low iron as of f/u with Dr. Jonette Eva yesterday - has to f/u today and next week for iron infusion. Currently off chemo due to low iron & WBC.    Diagnostic tests  X-ray completed in MD office revealed some OA in R shoulder    Patient Stated Goals  "to have more movement in my arm, get rid of most of the pain, and start strengthening my shoulder"    Currently in Pain?  Yes    Pain Score  1     Pain Location  Shoulder    Pain Orientation  Right    Pain Descriptors / Indicators  Dull "pinpoint"    Pain Type  Acute pain                       [facility] Adult PT Treatment/Exercise - 02/11/18 1105      Exercises   Exercises  Shoulder       Shoulder Exercises: Prone   Extension  Both;10 reps    Extension Limitations  "I" prone over green Pball    External Rotation  Both;10 reps    External Rotation Limitations  "W" prone over green Pball    Horizontal ABduction 1  Both;10 reps    Horizontal ABduction 1 Limitations  "T" prone over green Pball    Horizontal ABduction 2  Both;10 reps    Horizontal ABduction 2 Limitations  "Y" prone over green Pball      Shoulder Exercises: ROM/Strengthening   UBE (Upper Arm Bike)  L2 x 6 min (3' fwd/3' back)    Wall Pushups  15 reps    Wall Pushups Limitations  push-up +  on orange Pball      Shoulder Exercises: Stretch   Internal Rotation Stretch  30 seconds 3 reps    Internal Rotation Stretch Limitations  sleeper stretch      Manual Therapy   Manual Therapy  Soft tissue mobilization;Myofascial release;Passive ROM    Soft tissue mobilization  STM & DTM to R pecs, proximal/mid biceps & posterior shoulder complex    Myofascial Release  manual TPR to R lateral pec major, R short head of biceps, R teres group & lats    Passive ROM  R shoulder MWM into IR & ER       Trigger Point Dry Needling - 02/11/18 1105    Consent Given?  Yes    Muscles Treated Upper Body  Pectoralis major R bicep, teres group & lats    Pectoralis Major Response  Twitch response elicited;Palpable increased muscle length           PT Education - 02/11/18 1148    Education Details  HEP update - shoulder IR & ER stretches    Person(s) Educated  Patient    Methods  Explanation;Demonstration;Handout    Comprehension  Verbalized understanding;Returned demonstration       PT Short Term Goals - 12/24/17 1147      PT SHORT TERM GOAL #1   Title  independent with initial HEP    Status  Achieved        PT Long Term Goals - 02/09/18 1111      PT LONG TERM GOAL #1   Title  Independent with ongoing HEP +/- gym program     Status  Achieved    Target Date  --      PT LONG TERM GOAL #2   Title  R  shoulder AROM equivalent to L shoulder w/o pain provocation     Baseline  02/09/2018) Pain and limitation in ROM with IR    Status  On-going    Target Date  02/23/18      PT LONG TERM GOAL #3   Title  R shoulder strength >/= 4+/5 w/o pain on resistance    Status  Achieved      PT LONG TERM GOAL #4   Title  Patient to report ability to perform daily activities including ADLs, reaching and light lifting with R arm w/o pain provocation    Baseline  Pain with typing    Status  On-going    Target Date  02/23/18      PT LONG TERM GOAL #5   Title  Patient to return to working out w/o limitation due to R shoulder pain    Status  On-going    Target Date  02/23/18            Plan - 02/11/18 1112    Clinical Impression Statement  Jorge [name] reporting iron levels very low as of oncology visit yesterday, therefore limited more intensive exercises and focused more on manual therapy to address increased muscle tension/tightness which continues to restrict full ROM in R shoulder. Positive reponse noted with manual therapy includicg DN, with decreased muscle tension/tightness noted along with improved tolerance for stretching. HEP updated to include sleeper stretch for IR as pt noting better stretch than with towel stretch. Pt continues to have good potential to benefit from skilled PT to restore functional movement and strength in R UE w/o pain limitation.    Rehab Potential  Good    PT Treatment/Interventions  Patient/family education;ADLs/Self Care Home Management;Therapeutic exercise;Therapeutic  activities;Neuromuscular re-education;Manual techniques;Passive range of motion;Dry needling;Taping;Electrical Stimulation;Moist Heat;Cryotherapy;Vasopneumatic Device;Iontophoresis 3m/ml Dexamethasone    Consulted and Agree with Plan of Care  Patient       Patient will benefit from skilled therapeutic intervention in order to improve the following deficits and impairments:  Pain, Postural dysfunction,  Decreased range of motion, Decreased strength, Increased muscle spasms, Impaired flexibility, Impaired UE functional use, Decreased activity tolerance, Abnormal gait  Visit Diagnosis: Acute pain of right shoulder  Stiffness of right shoulder, not elsewhere classified  Abnormal posture  Muscle weakness (generalized)  Cramp and spasm     Problem List Patient Active Problem List   Diagnosis Date Noted  . Infection due to human metapneumovirus (hMPV) 02/04/2018  . Neutropenia with fever ([place] 02/02/2018  . SOB (shortness of breath)   . Pancytopenia ([place] 02/01/2018  . Bacteremia 11/10/2017  . Sepsis ([place] 11/09/2017  . HCAP (healthcare-associated pneumonia) 11/09/2017  . Thrombocytopenia ([place] 11/09/2017  . Iron deficiency anemia due to chronic blood loss   . Neutropenia ([place] 03/02/2017  . Cellulitis 03/01/2017  . Phlebitis or thrombophlebitis of lower extremity 07/21/2016  . Visit for preventive health examination 05/16/2016  . Aphthous ulcer 05/16/2016  . Ureterolithiasis 05/16/2016  . Acute deep vein thrombosis (DVT) of femoral vein of left lower extremity ([place] 03/21/2016  . Multiple myeloma ([place] 02/22/2016  . Multiple myeloma not having achieved remission ([place] 02/22/2016  . Bilateral inguinal hernia (BIH), s/p lap repair 12/16/2013 11/02/2013    JPercival Spanish PT, MPT 02/11/2018, 12:10 PM  [facility] [place] [address]Phone: [phone]  Fax:  [phone] Name: Jorge ShuklaMRN: [id number] of Birth: 8Jun 27, 1957

## 2018-02-12 LAB — PROTEIN ELECTROPHORESIS, SERUM, WITH REFLEX
A/G Ratio: 1.2 (ref 0.7–1.7)
Albumin ELP: 3.5 g/dL (ref 2.9–4.4)
Alpha-1-Globulin: 0.3 g/dL (ref 0.0–0.4)
Alpha-2-Globulin: 0.7 g/dL (ref 0.4–1.0)
Beta Globulin: 1.1 g/dL (ref 0.7–1.3)
Gamma Globulin: 0.9 g/dL (ref 0.4–1.8)
Globulin, Total: 3 g/dL (ref 2.2–3.9)
SPEP Interpretation: 0
Total Protein ELP: 6.5 g/dL (ref 6.0–8.5)

## 2018-02-12 LAB — IMMUNOFIXATION REFLEX, SERUM
IGA: 245 mg/dL (ref 61–437)
IGG (IMMUNOGLOBIN G), SERUM: 884 mg/dL (ref 700–1600)
IGM (IMMUNOGLOBULIN M), SRM: 29 mg/dL (ref 20–172)

## 2018-02-16 ENCOUNTER — Ambulatory Visit: Payer: 59 | Admitting: Physical Therapy

## 2018-02-16 DIAGNOSIS — M6281 Muscle weakness (generalized): Secondary | ICD-10-CM | POA: Diagnosis present

## 2018-02-16 DIAGNOSIS — M25611 Stiffness of right shoulder, not elsewhere classified: Secondary | ICD-10-CM | POA: Diagnosis not present

## 2018-02-16 DIAGNOSIS — R293 Abnormal posture: Secondary | ICD-10-CM | POA: Diagnosis not present

## 2018-02-16 DIAGNOSIS — R252 Cramp and spasm: Secondary | ICD-10-CM

## 2018-02-16 DIAGNOSIS — M25511 Pain in right shoulder: Secondary | ICD-10-CM | POA: Diagnosis present

## 2018-02-16 NOTE — Therapy (Addendum)
[facility] [address]  [place], [place], [address] Phone: [phone]   Fax:  [phone]  Physical Therapy Treatment  Patient Details  Name: Jorge [name] MRN: [id number] Date of Birth: 10/17/1955 Referring Provider: Elsie Saas, MD   Encounter Date: 02/16/2018  PT End of Session - 02/16/18 1103    Visit Number  11    Number of Visits  20    Date for PT Re-Evaluation  03/30/18    Authorization Type  UHC    Authorization - Number of Visits  60    PT Start Time  1101    PT Stop Time  1148    PT Time Calculation (min)  47 min    Activity Tolerance  Patient tolerated treatment well    Behavior During Therapy  [facility] for tasks assessed/performed       Past Medical History:  Diagnosis Date  . Anxiety   . Bone metastasis ([place])   . Chest cold 05/19/2016   productive cough  -- started on antibiotic  . Chronic back pain    due to bone mets from myeloma  . Cough   . Depression   . GERD (gastroesophageal reflux disease)   . Hiatal hernia   . History of chicken pox   . History of concussion    age 62 -- no residual  . History of DVT of lower extremity 03/21/2016  treated and completed w/ xarelto   per doppler left extensive occlusion common femoral, femoral, and popliteal veins and right partial occlusion common femoral and profunda femoral veins/  last doppler 06-04-2016 no evidence acute or chronic dvt noted either leg   . History of radiation therapy 06/09/16-06/23/16   lower thoracic spine 25 Gy in 10 fractions  . Mouth ulcers    secondary to radiation  . Multiple myeloma ([place]) dx 02/22/2016 via bone marrow bx---  oncologist-  [name]   IgG Kappa-- Hyperdiploid/ +11 w/ bone mets--  current treatment chemotherapy (started 08/ 2017)and pallitive radiation to back started 06-09-2016  . Renal calculus, right   . Wears contact lenses     Past Surgical History:  Procedure Laterality Date  . COLONOSCOPY   [id number]  . CYSTOSCOPY W/ URETERAL STENT PLACEMENT Right 06/20/2016   Procedure: CYSTOSCOPY WITH STENT REPLACEMENT;  Surgeon: Kathie Rhodes, MD;  Location: [facility];  Service: Urology;  Laterality: Right;  . CYSTOSCOPY WITH RETROGRADE PYELOGRAM, URETEROSCOPY AND STENT PLACEMENT Right 05/30/2016   Procedure: CYSTOSCOPY WITH RETROGRADE PYELOGRAM, URETEROSCOPY AND STENT PLACEMENT,DILITATION URETERAL STRICTURE;  Surgeon: Kathie Rhodes, MD;  Location: [facility];  Service: Urology;  Laterality: Right;  . CYSTOSCOPY/RETROGRADE/URETEROSCOPY/STONE EXTRACTION WITH BASKET Right 06/20/2016   Procedure: CYSTOSCOPY/URETEROSCOPY/STONE EXTRACTION WITH BASKET;  Surgeon: Kathie Rhodes, MD;  Location: [facility];  Service: Urology;  Laterality: Right;  . HOLMIUM LASER APPLICATION Right 34/03/1790   Procedure: HOLMIUM LASER APPLICATION;  Surgeon: Kathie Rhodes, MD;  Location: [facility];  Service: Urology;  Laterality: Right;  . IR GENERIC HISTORICAL  02/11/2016   IR RADIOLOGIST EVAL & MGMT 02/11/2016 [facility]  . IR GENERIC HISTORICAL  02/15/2016   IR BONE TUMOR(S)RF ABLATION 02/15/2016 Luanne Bras, MD [facility]  . IR GENERIC HISTORICAL  02/15/2016   IR BONE TUMOR(S)RF ABLATION 02/15/2016 Luanne Bras, MD [facility]  . IR GENERIC HISTORICAL  02/15/2016   IR BONE TUMOR(S)RF ABLATION 02/15/2016 Luanne Bras, MD [facility]  . IR GENERIC HISTORICAL  02/15/2016  IR KYPHO THORACIC WITH BONE BIOPSY 02/15/2016 Luanne Bras, MD [facility]  . IR GENERIC HISTORICAL  02/15/2016   IR KYPHO THORACIC WITH BONE BIOPSY 02/15/2016 Luanne Bras, MD [facility]  . IR GENERIC HISTORICAL  02/15/2016   IR VERTEBROPLASTY CERV/THOR BX INC UNI/BIL INC/INJECT/IMAGING 02/15/2016 Luanne Bras, MD [facility]  . IR GENERIC HISTORICAL  03/13/2016   IR KYPHO EA ADDL LEVEL THORACIC OR LUMBAR 03/13/2016 Luanne Bras, MD [facility]  . IR GENERIC HISTORICAL  03/13/2016    IR KYPHO EA ADDL LEVEL THORACIC OR LUMBAR 03/13/2016 Luanne Bras, MD [facility]  . IR GENERIC HISTORICAL  03/13/2016   IR BONE TUMOR(S)RF ABLATION 03/13/2016 Luanne Bras, MD [facility]  . IR GENERIC HISTORICAL  03/13/2016   IR KYPHO LUMBAR INC FX REDUCE BONE BX UNI/BIL CANNULATION INC/IMAGING 03/13/2016 Luanne Bras, MD [facility]  . IR GENERIC HISTORICAL  03/13/2016   IR BONE TUMOR(S)RF ABLATION 03/13/2016 Luanne Bras, MD [facility]  . IR GENERIC HISTORICAL  03/13/2016   IR BONE TUMOR(S)RF ABLATION 03/13/2016 Luanne Bras, MD [facility]  . IR GENERIC HISTORICAL  03/31/2016   IR RADIOLOGIST EVAL & MGMT 03/31/2016 [facility]  . LAPAROSCOPIC INGUINAL HERNIA REPAIR Bilateral 12-16-2013  dr gross  . RADIOLOGY WITH ANESTHESIA N/A 02/15/2016   Procedure: Spinal Ablation;  Surgeon: Luanne Bras, MD;  Location: [facility];  Service: Radiology;  Laterality: N/A;  . RADIOLOGY WITH ANESTHESIA N/A 03/13/2016   Procedure: LUMBER ABLATION;  Surgeon: Luanne Bras, MD;  Location: [facility];  Service: Radiology;  Laterality: N/A;  . ROTATOR CUFF REPAIR Right 2003  . TONSILLECTOMY  age 69  . WISDOM TOOTH EXTRACTION      There were no vitals filed for this visit.  Subjective Assessment - 02/16/18 1450    Subjective  Pt reporting that he is feeling a lot better today after iron infusion, and is looking forward to the next one on Thursday 02/19/18. Pt reports no new concerns and that he has made some modifications to his home set up when he is on the computer to help prevent flaring up the shoulder.     Pertinent History  multiple myeloma s/p stem cell transplant 08/22/2016 at [facility] - currently controlled but not in remission - follows up regularly with Dr. Marin Olp; multiple vertebral compression fractures with kyphoplasties and vertbroloplasties    Limitations  House hold activities;Lifting    Diagnostic tests  X-ray completed in MD office revealed some OA in R shoulder    Patient  Stated Goals  "to have more movement in my arm, get rid of most of the pain, and start strengthening my shoulder"    Currently in Pain?  Yes    Pain Score  1     Pain Location  Shoulder    Pain Orientation  Right    Pain Descriptors / Indicators  Dull    Pain Type  Acute pain    Multiple Pain Sites  No                       [facility] Adult PT Treatment/Exercise - 02/16/18 0001      Exercises   Exercises  Neck      Neck Exercises: Standing   Wall Push Ups  10 reps    Wall Push Ups Limitations  With increased scapular winging on the R as compared to the L    Other Standing Exercises  With both UEs on sides of inverted bosu ball  sitting on table, shifting weight back and forth to each UE       Shoulder Exercises: Seated   Row  Both;10 reps;Weights    Row Weight (lbs)  10#    Row Limitations  VC for scapular retraction    Horizontal ABduction  Both    Theraband Level (Shoulder Horizontal ABduction)  Level 2 (Red)      Shoulder Exercises: Standing   Other Standing Exercises  PNF D2 flexion - resisted with yellow TB in door; 10 reps    Other Standing Exercises  PNF D1 flexion/extension in standing; no resistance 10 reps. 10 reps with Yellow TB in doorway       Shoulder Exercises: ROM/Strengthening   UBE (Upper Arm Bike)  L3 x 6 min (3 min forward/3 min backward)    Lat Pull  10 reps    Lat Pull Limitations  10# weight    Other ROM/Strengthening Exercises  Standing with arms at 90 degrees of flexion and elbows extended on top of lat pull down bar, extending arms down to neutral; 10 reps with 10# weight      Manual Therapy   Manual Therapy  Soft tissue mobilization;Myofascial release    Manual therapy comments  Supine, Pt in hooklying with back of knees supported by bolster    Soft tissue mobilization  STM and CFM to pec musculature and proximal biceps    Myofascial Release  Manual TPR to biceps musculature with no twitch response elicited               PT  Short Term Goals - 12/24/17 1147      PT SHORT TERM GOAL #1   Title  independent with initial HEP    Status  Achieved        PT Long Term Goals - 02/16/18 1813      PT LONG TERM GOAL #1   Title  Independent with ongoing HEP +/- gym program     Status  Achieved      PT LONG TERM GOAL #2   Title  R shoulder AROM equivalent to L shoulder w/o pain provocation     Baseline  02/09/2018) Pain and limitation in ROM with IR    Status  On-going    Target Date  03/30/18      PT LONG TERM GOAL #3   Title  R shoulder strength >/= 4+/5 w/o pain on resistance    Status  Achieved      PT LONG TERM GOAL #4   Title  Patient to report ability to perform daily activities including ADLs, reaching and light lifting with R arm w/o pain provocation    Baseline  Pain with typing    Status  On-going    Target Date  03/30/18      PT LONG TERM GOAL #5   Title  Patient to return to working out w/o limitation due to R shoulder pain    Status  On-going    Target Date  03/30/18            Plan - 02/16/18 1812    Clinical Impression Statement  [name] noting increased energy during today's session secondary to iron injection on 02/11/2018. He demonstrates increased strength with less instability than in recent therapy sessions, which could be attributed to fatigue and muscle wasting from a bout of pneumonia that pt suffered recently. He continues to experience pain with activities involving IR, including PNF activities performed during today's session. He  will continue to benefit from physical therapy for an additional 8 visits to continue to improve his functional ability, and decrease pain levels with activities.     Rehab Potential  Good    PT Frequency  2x / week    PT Duration  4 weeks    PT Treatment/Interventions  Patient/family education;Therapeutic exercise;Therapeutic activities;Neuromuscular re-education;Manual techniques;Passive range of motion;Dry needling;Taping;Electrical Stimulation;Moist  Heat;Cryotherapy;Vasopneumatic Device;Iontophoresis 36m/ml Dexamethasone;ADLs/Self Care Home Management       Patient will benefit from skilled therapeutic intervention in order to improve the following deficits and impairments:  Pain, Postural dysfunction, Decreased range of motion, Decreased strength, Increased muscle spasms, Impaired flexibility, Impaired UE functional use, Decreased activity tolerance, Abnormal gait  Visit Diagnosis: Acute pain of right shoulder  Stiffness of right shoulder, not elsewhere classified  Abnormal posture  Muscle weakness (generalized)  Cramp and spasm     Problem List Patient Active Problem List   Diagnosis Date Noted  . Infection due to human metapneumovirus (hMPV) 02/04/2018  . Neutropenia with fever ([place] 02/02/2018  . SOB (shortness of breath)   . Pancytopenia ([place] 02/01/2018  . Bacteremia 11/10/2017  . Sepsis ([place] 11/09/2017  . HCAP (healthcare-associated pneumonia) 11/09/2017  . Thrombocytopenia ([place] 11/09/2017  . Iron deficiency anemia due to chronic blood loss   . Neutropenia ([place] 03/02/2017  . Cellulitis 03/01/2017  . Phlebitis or thrombophlebitis of lower extremity 07/21/2016  . Visit for preventive health examination 05/16/2016  . Aphthous ulcer 05/16/2016  . Ureterolithiasis 05/16/2016  . Acute deep vein thrombosis (DVT) of femoral vein of left lower extremity (HLewistown Heights 03/21/2016  . Multiple myeloma (HW[place] 02/22/2016  . Multiple myeloma not having achieved remission ([place]Union[place] 02/22/2016  . Bilateral inguinal hernia (BIH), s/p lap repair 12/16/2013 11/02/2013    JShirline Frees SPT 02/16/2018, 6:46 PM  [facility] [place] [address]Phone: [phone]  Fax:  [phone] Name: MDvid PendryMRN: [id number] of Birth: [birth year]

## 2018-02-18 ENCOUNTER — Encounter: Payer: Self-pay | Admitting: Physical Therapy

## 2018-02-18 ENCOUNTER — Ambulatory Visit: Payer: 59 | Admitting: Physical Therapy

## 2018-02-18 ENCOUNTER — Inpatient Hospital Stay: Payer: 59

## 2018-02-18 VITALS — BP 111/74 | HR 75 | Temp 97.9°F | Resp 17

## 2018-02-18 DIAGNOSIS — M25511 Pain in right shoulder: Secondary | ICD-10-CM | POA: Diagnosis present

## 2018-02-18 DIAGNOSIS — R293 Abnormal posture: Secondary | ICD-10-CM | POA: Diagnosis not present

## 2018-02-18 DIAGNOSIS — R252 Cramp and spasm: Secondary | ICD-10-CM

## 2018-02-18 DIAGNOSIS — M25611 Stiffness of right shoulder, not elsewhere classified: Secondary | ICD-10-CM

## 2018-02-18 DIAGNOSIS — M6281 Muscle weakness (generalized): Secondary | ICD-10-CM

## 2018-02-18 DIAGNOSIS — C9000 Multiple myeloma not having achieved remission: Secondary | ICD-10-CM

## 2018-02-18 MED ORDER — SODIUM CHLORIDE 0.9 % IV SOLN
510.0000 mg | Freq: Once | INTRAVENOUS | Status: AC
  Administered 2018-02-18: 510 mg via INTRAVENOUS
  Filled 2018-02-18: qty 17

## 2018-02-18 NOTE — Progress Notes (Signed)
Pt stated he had a physical therapy appointment to get to and did not want to stay for the full 30 minutes of observation period. Pt aware of complications and side effects and to call with any concerns. Pt verbalized understanding and had no further questions.

## 2018-02-18 NOTE — Patient Instructions (Signed)

## 2018-02-18 NOTE — Therapy (Signed)
[facility] [address]  [place], [place], [address] Phone: [phone]   Fax:  [phone]  Physical Therapy Treatment  Patient Details  Name: Jorge [name] MRN: [id number] Date of Birth: Apr 09, 1956 Referring Provider: Elsie Saas, MD   Encounter Date: 02/18/2018  PT End of Session - 02/18/18 1110    Visit Number  12    Number of Visits  20    Date for PT Re-Evaluation  03/30/18    Authorization Type  [employer]    Authorization - Number of Visits  60    PT Start Time  1100    PT Stop Time  1147    PT Time Calculation (min)  47 min    Activity Tolerance  Patient tolerated treatment well    Behavior During Therapy  [facility] for tasks assessed/performed       Past Medical History:  Diagnosis Date  . Anxiety   . Bone metastasis ([place])   . Chest cold 05/19/2016   productive cough  -- started on antibiotic  . Chronic back pain    due to bone mets from myeloma  . Cough   . Depression   . GERD (gastroesophageal reflux disease)   . Hiatal hernia   . History of chicken pox   . History of concussion    age 62 -- no residual  . History of DVT of lower extremity 03/21/2016  treated and completed w/ xarelto   per doppler left extensive occlusion common femoral, femoral, and popliteal veins and right partial occlusion common femoral and profunda femoral veins/  last doppler 06-04-2016 no evidence acute or chronic dvt noted either leg   . History of radiation therapy 06/09/16-06/23/16   lower thoracic spine 25 Gy in 10 fractions  . Mouth ulcers    secondary to radiation  . Multiple myeloma ([place]) dx 02/22/2016 via bone marrow bx---  oncologist-  [name]   IgG Kappa-- Hyperdiploid/ +11 w/ bone mets--  current treatment chemotherapy (started 08/ 2017)and pallitive radiation to back started 06-09-2016  . Renal calculus, right   . Wears contact lenses     Past Surgical History:  Procedure Laterality Date  . COLONOSCOPY   [id number]  . CYSTOSCOPY W/ URETERAL STENT PLACEMENT Right 06/20/2016   Procedure: CYSTOSCOPY WITH STENT REPLACEMENT;  Surgeon: Kathie Rhodes, MD;  Location: [facility];  Service: Urology;  Laterality: Right;  . CYSTOSCOPY WITH RETROGRADE PYELOGRAM, URETEROSCOPY AND STENT PLACEMENT Right 05/30/2016   Procedure: CYSTOSCOPY WITH RETROGRADE PYELOGRAM, URETEROSCOPY AND STENT PLACEMENT,DILITATION URETERAL STRICTURE;  Surgeon: Kathie Rhodes, MD;  Location: [facility];  Service: Urology;  Laterality: Right;  . CYSTOSCOPY/RETROGRADE/URETEROSCOPY/STONE EXTRACTION WITH BASKET Right 06/20/2016   Procedure: CYSTOSCOPY/URETEROSCOPY/STONE EXTRACTION WITH BASKET;  Surgeon: Kathie Rhodes, MD;  Location: [facility];  Service: Urology;  Laterality: Right;  . HOLMIUM LASER APPLICATION Right 14/03/7025   Procedure: HOLMIUM LASER APPLICATION;  Surgeon: Kathie Rhodes, MD;  Location: [facility];  Service: Urology;  Laterality: Right;  . IR GENERIC HISTORICAL  02/11/2016   IR RADIOLOGIST EVAL & MGMT 02/11/2016 [facility]  . IR GENERIC HISTORICAL  02/15/2016   IR BONE TUMOR(S)RF ABLATION 02/15/2016 Luanne Bras, MD [facility]  . IR GENERIC HISTORICAL  02/15/2016   IR BONE TUMOR(S)RF ABLATION 02/15/2016 Luanne Bras, MD [facility]  . IR GENERIC HISTORICAL  02/15/2016   IR BONE TUMOR(S)RF ABLATION 02/15/2016 Luanne Bras, MD [facility]  . IR GENERIC HISTORICAL  02/15/2016  IR KYPHO THORACIC WITH BONE BIOPSY 02/15/2016 Luanne Bras, MD [facility]  . IR GENERIC HISTORICAL  02/15/2016   IR KYPHO THORACIC WITH BONE BIOPSY 02/15/2016 Luanne Bras, MD [facility]  . IR GENERIC HISTORICAL  02/15/2016   IR VERTEBROPLASTY CERV/THOR BX INC UNI/BIL INC/INJECT/IMAGING 02/15/2016 Luanne Bras, MD [facility]  . IR GENERIC HISTORICAL  03/13/2016   IR KYPHO EA ADDL LEVEL THORACIC OR LUMBAR 03/13/2016 Luanne Bras, MD [facility]  . IR GENERIC HISTORICAL  03/13/2016    IR KYPHO EA ADDL LEVEL THORACIC OR LUMBAR 03/13/2016 Luanne Bras, MD [facility]  . IR GENERIC HISTORICAL  03/13/2016   IR BONE TUMOR(S)RF ABLATION 03/13/2016 Luanne Bras, MD [facility]  . IR GENERIC HISTORICAL  03/13/2016   IR KYPHO LUMBAR INC FX REDUCE BONE BX UNI/BIL CANNULATION INC/IMAGING 03/13/2016 Luanne Bras, MD [facility]  . IR GENERIC HISTORICAL  03/13/2016   IR BONE TUMOR(S)RF ABLATION 03/13/2016 Luanne Bras, MD [facility]  . IR GENERIC HISTORICAL  03/13/2016   IR BONE TUMOR(S)RF ABLATION 03/13/2016 Luanne Bras, MD [facility]  . IR GENERIC HISTORICAL  03/31/2016   IR RADIOLOGIST EVAL & MGMT 03/31/2016 [facility]  . LAPAROSCOPIC INGUINAL HERNIA REPAIR Bilateral 12-16-2013  dr gross  . RADIOLOGY WITH ANESTHESIA N/A 02/15/2016   Procedure: Spinal Ablation;  Surgeon: Luanne Bras, MD;  Location: [facility];  Service: Radiology;  Laterality: N/A;  . RADIOLOGY WITH ANESTHESIA N/A 03/13/2016   Procedure: LUMBER ABLATION;  Surgeon: Luanne Bras, MD;  Location: [place];  Service: Radiology;  Laterality: N/A;  . ROTATOR CUFF REPAIR Right 2003  . TONSILLECTOMY  age 62  . WISDOM TOOTH EXTRACTION      There were no vitals filed for this visit.  Subjective Assessment - 02/18/18 1103    Subjective  Pt notes new no concerns today and did have an iron infusion this morning.     Pertinent History  multiple myeloma s/p stem cell transplant 08/22/2016 at [facility] - currently controlled but not in remission - follows up regularly with Dr. Marin Olp; multiple vertebral compression fractures with kyphoplasties and vertbroloplasties    Limitations  House hold activities;Lifting    Diagnostic tests  X-ray completed in MD office revealed some OA in R shoulder    Patient Stated Goals  "to have more movement in my arm, get rid of most of the pain, and start strengthening my shoulder"    Currently in Pain?  Yes    Pain Score  1     Pain Location  Shoulder    Pain Orientation   Right    Pain Descriptors / Indicators  Dull    Pain Radiating Towards  Front of shoulder, back of shoulder and into the joint    Multiple Pain Sites  No                       [facility] Adult PT Treatment/Exercise - 02/18/18 0001      Self-Care   Self-Care  Other Self-Care Comments    Posture  Pt instructed on benefits of performing "sun salutations" or "snow angels" in supine instead of standing to decresae pressure through the spine      Exercises   Exercises  Shoulder      Neck Exercises: Standing   Wall Push Ups  10 reps    Wall Push Ups Limitations  with hands on dynadisk on wall      Shoulder Exercises: Supine   Other Supine Exercises  Supine "snow angels" with pt going into as much abduction as possible with PT assisted pec stretching over the anterior shoulder. 2 x 10 movements.      Shoulder Exercises: Sidelying   Other Sidelying Exercises  Scapula PNF; D1 flexion/extension with TC over inferior border and superior border of scapulae. 2 x 10 reps with graded pressure       Shoulder Exercises: Standing   Other Standing Exercises  Standing serratus punch resisted by  red TB; 2 x 10 reps    Other Standing Exercises  Standing "robots" - arms at 90/90 position and alterating IR/ER with each UE reciprocally; 2 x 15 cycles        Shoulder Exercises: ROM/Strengthening   UBE (Upper Arm Bike)  L3 x 3 min forward/3 min backward               PT Short Term Goals - 12/24/17 1147      PT SHORT TERM GOAL #1   Title  independent with initial HEP    Status  Achieved        PT Long Term Goals - 02/16/18 1813      PT LONG TERM GOAL #1   Title  Independent with ongoing HEP +/- gym program     Status  Achieved      PT LONG TERM GOAL #2   Title  R shoulder AROM equivalent to L shoulder w/o pain provocation     Baseline  02/09/2018) Pain and limitation in ROM with IR    Status  On-going    Target Date  03/30/18      PT LONG TERM GOAL #3   Title  R  shoulder strength >/= 4+/5 w/o pain on resistance    Status  Achieved      PT LONG TERM GOAL #4   Title  Patient to report ability to perform daily activities including ADLs, reaching and light lifting with R arm w/o pain provocation    Baseline  Pain with typing    Status  On-going    Target Date  03/30/18      PT LONG TERM GOAL #5   Title  Patient to return to working out w/o limitation due to R shoulder pain    Status  On-going    Target Date  03/30/18            Plan - 02/18/18 1306    Clinical Impression Statement  [name] demonstrating improvement in shoulder ROM today but still feels very limited through the front of the shoulder.  Today session focused on serratus anterior strengthening to address R scapular winging and scapular dyskinesia that is contributing to pt's impairments, as well as address ROM.  He will continue to benefit from continued physical therapy to continue to improve his pain-free shoulder ROM and quality of glenohumeral joint motion.     Rehab Potential  Good    Clinical Impairments Affecting Rehab Potential  multiple myeloma s/p stem cell transplant 08/22/2016 at [facility] - currently controlled but not in remission; multiple vertebral compression fractures with kyphoplasties and vertbroloplasties    PT Treatment/Interventions  Patient/family education;Therapeutic exercise;Therapeutic activities;Neuromuscular re-education;Manual techniques;Passive range of motion;Dry needling;Taping;Electrical Stimulation;Moist Heat;Cryotherapy;Vasopneumatic Device;Iontophoresis 3m/ml Dexamethasone;ADLs/Self Care Home Management    Consulted and Agree with Plan of Care  Patient       Patient will benefit from skilled therapeutic intervention in order to improve the following deficits and impairments:  Pain, Postural dysfunction, Decreased range of motion, Decreased strength, Increased  muscle spasms, Impaired flexibility, Impaired UE functional use, Decreased activity tolerance,  Abnormal gait  Visit Diagnosis: Acute pain of right shoulder  Stiffness of right shoulder, not elsewhere classified  Abnormal posture  Muscle weakness (generalized)  Cramp and spasm     Problem List Patient Active Problem List   Diagnosis Date Noted  . Infection due to human metapneumovirus (hMPV) 02/04/2018  . Neutropenia with fever ([place]) 02/02/2018  . SOB (shortness of breath)   . Pancytopenia ([place]) 02/01/2018  . Bacteremia 11/10/2017  . Sepsis ([place]) 11/09/2017  . HCAP (healthcare-associated pneumonia) 11/09/2017  . Thrombocytopenia ([place]) 11/09/2017  . Iron deficiency anemia due to chronic blood loss   . Neutropenia ([place]) 03/02/2017  . Cellulitis 03/01/2017  . Phlebitis or thrombophlebitis of lower extremity 07/21/2016  . Visit for preventive health examination 05/16/2016  . Aphthous ulcer 05/16/2016  . Ureterolithiasis 05/16/2016  . Acute deep vein thrombosis (DVT) of femoral vein of left lower extremity ([place]) 03/21/2016  . Multiple myeloma ([place]) 02/22/2016  . Multiple myeloma not having achieved remission ([place]) 02/22/2016  . Bilateral inguinal hernia (BIH), s/p lap repair 12/16/2013 11/02/2013    Shirline Frees, SPT  02/18/2018, 1:13 PM  [facility] [address]  [place], [place], [address] Phone: [phone]   Fax:  [phone]  Name: Remigio Mcmillon MRN: [id number] Date of Birth: 1955/12/14

## 2018-02-19 DIAGNOSIS — D509 Iron deficiency anemia, unspecified: Secondary | ICD-10-CM | POA: Diagnosis not present

## 2018-02-19 DIAGNOSIS — M25511 Pain in right shoulder: Secondary | ICD-10-CM | POA: Diagnosis not present

## 2018-02-19 DIAGNOSIS — R252 Cramp and spasm: Secondary | ICD-10-CM | POA: Diagnosis present

## 2018-02-19 DIAGNOSIS — M6281 Muscle weakness (generalized): Secondary | ICD-10-CM | POA: Diagnosis present

## 2018-02-19 DIAGNOSIS — R293 Abnormal posture: Secondary | ICD-10-CM | POA: Diagnosis not present

## 2018-02-19 DIAGNOSIS — Z79899 Other long term (current) drug therapy: Secondary | ICD-10-CM | POA: Diagnosis not present

## 2018-02-19 DIAGNOSIS — Z86718 Personal history of other venous thrombosis and embolism: Secondary | ICD-10-CM | POA: Diagnosis not present

## 2018-02-19 DIAGNOSIS — C9000 Multiple myeloma not having achieved remission: Secondary | ICD-10-CM | POA: Diagnosis present

## 2018-02-19 DIAGNOSIS — Z7901 Long term (current) use of anticoagulants: Secondary | ICD-10-CM | POA: Diagnosis not present

## 2018-02-19 DIAGNOSIS — M25611 Stiffness of right shoulder, not elsewhere classified: Secondary | ICD-10-CM | POA: Diagnosis not present

## 2018-02-22 ENCOUNTER — Ambulatory Visit (INDEPENDENT_AMBULATORY_CARE_PROVIDER_SITE_OTHER): Payer: 59 | Admitting: Medical

## 2018-02-22 ENCOUNTER — Encounter: Payer: Self-pay | Admitting: Medical

## 2018-02-22 VITALS — BP 117/67 | HR 77 | Temp 98.2°F | Resp 16 | Ht 67.0 in | Wt 171.8 lb

## 2018-02-22 DIAGNOSIS — C9000 Multiple myeloma not having achieved remission: Secondary | ICD-10-CM | POA: Diagnosis not present

## 2018-02-22 DIAGNOSIS — Z8701 Personal history of pneumonia (recurrent): Secondary | ICD-10-CM | POA: Diagnosis not present

## 2018-02-22 NOTE — Progress Notes (Signed)
Subjective:    Patient ID: Jorge [name], male    DOB: 1956/02/26, 62 y.o.   MRN: [id number]  HPI  Pt in for follow up.  Pt had hospitalization. Pt had viral pneumonia. He recovered and was discharge on 02/05/2018.   Pt had multiple myeloma. He has had  prior medication stopped.  Dr. Marin Olp note   "Mr. Raulston is a pleasant 62 yo gentleman with IgG kappa myeloma.  He ultimately underwent a autologous stem cell transplant at [facility] in February 2018.  Again, we will hold his Revlimid.  I will call Dr. Laverta Baltimore.  We will have to see what happens when he goes out to [facility] in a couple weeks.  I think that restarting any maintenance therapy will really be dependent upon his M spike.  He comes back in 3 weeks.  If his white cell count is still low at that time, I will give him a dose of Neulasta so that his white cell count is adequate for his trip to [place].."  Also pt had 2 iron infusions since he was discharged. Now his energy is back to normal.  Pt will see  Dr. Marin Olp and Dr at [facility] before he leaves for trip to [place].  On review antibody fungal studies were negative. Did not see any fungal culture result.  On review of health maintenance. If doe not show gap in pneumo vaccine.   Review of Systems  Constitutional: Negative for chills, fatigue and fever.  HENT: Negative for congestion and drooling.   Respiratory: Negative for cough, chest tightness, shortness of breath and wheezing.   Cardiovascular: Negative for chest pain and palpitations.  Gastrointestinal: Negative for abdominal distention, abdominal pain, blood in stool, constipation, diarrhea, nausea and vomiting.  Genitourinary: Negative for dysuria, flank pain and frequency.  Musculoskeletal: Negative for back pain, gait problem, neck pain and neck stiffness.  Skin: Negative for rash.  Neurological: Negative for dizziness, weakness and headaches.  Hematological: Negative for adenopathy. Does not bruise/bleed easily.    Psychiatric/Behavioral: Negative for agitation, behavioral problems and sleep disturbance. The patient is not nervous/anxious.     Past Medical History:  Diagnosis Date  . Anxiety   . Bone metastasis ([place])   . Chest cold 05/19/2016   productive cough  -- started on antibiotic  . Chronic back pain    due to bone mets from myeloma  . Cough   . Depression   . GERD (gastroesophageal reflux disease)   . Hiatal hernia   . History of chicken pox   . History of concussion    age 62 -- no residual  . History of DVT of lower extremity 03/21/2016  treated and completed w/ xarelto   per doppler left extensive occlusion common femoral, femoral, and popliteal veins and right partial occlusion common femoral and profunda femoral veins/  last doppler 06-04-2016 no evidence acute or chronic dvt noted either leg   . History of radiation therapy 06/09/16-06/23/16   lower thoracic spine 25 Gy in 10 fractions  . Mouth ulcers    secondary to radiation  . Multiple myeloma ([place]) dx 02/22/2016 via bone marrow bx---  oncologist-  [name]   IgG Kappa-- Hyperdiploid/ +11 w/ bone mets--  current treatment chemotherapy (started 08/ 2017)and pallitive radiation to back started 06-09-2016  . Renal calculus, right   . Wears contact lenses      Social History   Socioeconomic History  . Marital status: Married    Spouse name: Not on file  .  Number of children: 3  . Years of education: Not on file  . Highest education level: Not on file  Occupational History  . Not on file  Social Needs  . Financial resource strain: Not on file  . Food insecurity:    Worry: Not on file    Inability: Not on file  . Transportation needs:    Medical: Not on file    Non-medical: Not on file  Tobacco Use  . Smoking status: Former Smoker    Packs/day: 1.00    Years: 7.00    Pack years: 7.00    Types: Cigarettes    Last attempt to quit: 11/02/1980    Years since quitting: 37.3  . Smokeless tobacco: Never Used   Substance and Sexual Activity  . Alcohol use: Yes    Comment: occasional  . Drug use: No  . Sexual activity: Never  Lifestyle  . Physical activity:    Days per week: Not on file    Minutes per session: Not on file  . Stress: Not on file  Relationships  . Social connections:    Talks on phone: Not on file    Gets together: Not on file    Attends religious service: Not on file    Active member of club or organization: Not on file    Attends meetings of clubs or organizations: Not on file    Relationship status: Not on file  . Intimate partner violence:    Fear of current or ex partner: Not on file    Emotionally abused: Not on file    Physically abused: Not on file    Forced sexual activity: Not on file  Other Topics Concern  . Not on file  Social History Narrative  . Not on file    Past Surgical History:  Procedure Laterality Date  . COLONOSCOPY  [id number]  . CYSTOSCOPY W/ URETERAL STENT PLACEMENT Right 06/20/2016   Procedure: CYSTOSCOPY WITH STENT REPLACEMENT;  Surgeon: Kathie Rhodes, MD;  Location[facility];  Service: Urology;  Laterality: Right;  . CYSTOSCOPY WITH RETROGRADE PYELOGRAM, URETEROSCOPY AND STENT PLACEMENT Right 05/30/2016   Procedure: CYSTOSCOPY WITH RETROGRADE PYELOGRAM, URETEROSCOPY AND STENT PLACEMENT,DILITATION URETERAL STRICTURE;  Surgeon: Kathie Rhodes, MD;  [facility];  Service: Urology;  Laterality: Right;  . CYSTOSCOPY/RETROGRADE/URETEROSCOPY/STONE EXTRACTION WITH BASKET Right 06/20/2016   Procedure: CYSTOSCOPY/URETEROSCOPY/STONE EXTRACTION WITH BASKET;  Surgeon: Kathie Rhodes, MD;  Location: [facility];  Service: Urology;  Laterality: Right;  . HOLMIUM LASER APPLICATION Right 33/08/9516   Procedure: HOLMIUM LASER APPLICATION;  Surgeon: Kathie Rhodes, MD;  Location: [facility];  Service: Urology;  Laterality: Right;  . IR GENERIC HISTORICAL  02/11/2016   IR RADIOLOGIST EVAL & MGMT 02/11/2016 [facility]  . IR GENERIC HISTORICAL  02/15/2016   IR BONE TUMOR(S)RF ABLATION 02/15/2016 Luanne Bras, MD [facility]  . IR GENERIC HISTORICAL  02/15/2016   IR BONE TUMOR(S)RF ABLATION 02/15/2016 Luanne Bras, MD [facility]  . IR GENERIC HISTORICAL  02/15/2016   IR BONE TUMOR(S)RF ABLATION 02/15/2016 Luanne Bras, MD [facility]  . IR GENERIC HISTORICAL  02/15/2016   IR KYPHO THORACIC WITH BONE BIOPSY 02/15/2016 Luanne Bras, MD [facility]  . IR GENERIC HISTORICAL  02/15/2016   IR KYPHO THORACIC WITH BONE BIOPSY 02/15/2016 Luanne Bras, MD [facility]  . IR GENERIC HISTORICAL  02/15/2016   IR VERTEBROPLASTY CERV/THOR BX INC UNI/BIL INC/INJECT/IMAGING 02/15/2016 Luanne Bras, MD [facility]  . IR GENERIC HISTORICAL  03/13/2016   IR KYPHO EA ADDL LEVEL THORACIC OR LUMBAR 03/13/2016 Luanne Bras, MD [facility]  . IR GENERIC HISTORICAL  03/13/2016   IR KYPHO EA ADDL LEVEL THORACIC OR LUMBAR 03/13/2016 Luanne Bras, MD [facility]  . IR GENERIC HISTORICAL  03/13/2016   IR BONE TUMOR(S)RF ABLATION 03/13/2016 Luanne Bras, MD [facility]  . IR GENERIC HISTORICAL  03/13/2016   IR KYPHO LUMBAR INC FX REDUCE BONE BX UNI/BIL CANNULATION INC/IMAGING 03/13/2016 Luanne Bras, MD [facility]  . IR GENERIC HISTORICAL  03/13/2016   IR BONE TUMOR(S)RF ABLATION 03/13/2016 Luanne Bras, MD [facility]  . IR GENERIC HISTORICAL  03/13/2016   IR BONE TUMOR(S)RF ABLATION 03/13/2016 Luanne Bras, MD [facility]  . IR GENERIC HISTORICAL  03/31/2016   IR RADIOLOGIST EVAL & MGMT 03/31/2016 [facility]  . LAPAROSCOPIC INGUINAL HERNIA REPAIR Bilateral 12-16-2013  dr gross  . RADIOLOGY WITH ANESTHESIA N/A 02/15/2016   Procedure: Spinal Ablation;  Surgeon: Luanne Bras, MD;  Location: [place];  Service: Radiology;  Laterality: N/A;  . RADIOLOGY WITH ANESTHESIA N/A 03/13/2016   Procedure: LUMBER ABLATION;  Surgeon: Luanne Bras, MD;  Location: [place];  Service:  Radiology;  Laterality: N/A;  . ROTATOR CUFF REPAIR Right 2003  . TONSILLECTOMY  age 77  . WISDOM TOOTH EXTRACTION      Family History  Problem Relation Age of Onset  . Uterine cancer Mother   . Heart disease Father   . Hypertension Father   . Multiple sclerosis Sister   . Paranoid behavior Brother   . Drug abuse Brother   . Schizophrenia Brother   . Stroke Maternal Grandfather   . Cancer Maternal Aunt   . Leukemia Paternal Aunt   . Healthy Son        x1  . Healthy Daughter        x2  . Allergies Daughter        x1  . Diabetes Neg Hx   . Alzheimer's disease Neg Hx   . Parkinson's disease Neg Hx     No Known Allergies  Current Outpatient Medications on File Prior to Visit  Medication Sig Dispense Refill  . acetaminophen (TYLENOL) 325 MG tablet Take 2 tablets (650 mg total) by mouth every 6 (six) hours as needed for mild pain (or Fever >/= 101).    [name] albuterol (PROVENTIL HFA;VENTOLIN HFA) 108 (90 Base) MCG/ACT inhaler Inhale 2 puffs into the lungs every 6 (six) hours as needed for wheezing or shortness of breath. 1 Inhaler 2  . benzonatate (TESSALON) 200 MG capsule Take 1 capsule (200 mg total) by mouth 3 (three) times daily as needed for cough. 60 capsule 0  . cetirizine (ZYRTEC) 10 MG tablet Take 10 mg by mouth daily as needed for allergies.    . cholecalciferol (VITAMIN D) 1000 units tablet Take 1,000 Units by mouth daily.    . famciclovir (FAMVIR) 500 MG tablet Take 1 tablet (500 mg total) by mouth daily. 90 tablet 0  . lidocaine (LIDODERM) 5 % PLAEC 1 PATCH ONTO THE SKIN DAILY as needed for pain. APPLY 1 PATCH TO THE MOST PAINFUL AREA FOR 12 HR IN A 24 HR PERIOD  2  . magnesium oxide (MAG-OX) 400 MG tablet Take 400 mg by mouth daily.    . ondansetron (ZOFRAN-ODT) 8 MG disintegrating tablet Take 1 tablet (8 mg total) by mouth every 8 (eight) hours as needed. 60 tablet 1  . OVER THE COUNTER MEDICATION Take 1 tablet by mouth  daily.     . polyethylene glycol powder (MIRALAX)  powder Take 17 g by mouth daily. (Patient taking differently: Take 17 g by mouth daily as needed for mild constipation. ) 850 g 3  . Probiotic Product (PROBIOTIC PO) Take 1 capsule by mouth daily.    [name] rOPINIRole (REQUIP) 0.25 MG tablet TAKE 1 TABLET BY MOUTH 1 TO 3 HOURS BEFORE BED FOR 2 DAYS THEN INCREASE TO 2 TABS (Patient taking differently: TAKE 2 TABLETS (0.39m) BY MOUTH 1 TO 3 HOURS BEFORE BED as needed for restless legs.) 60 tablet 2  . senna (SENOKOT) 8.6 MG TABS tablet Take 2 tablets (17.2 mg total) by mouth daily. (Patient taking differently: Take 1 tablet by mouth daily as needed for mild constipation. ) 60 each 2  . sertraline (ZOLOFT) 100 MG tablet Take 100 mg by mouth at bedtime.   2  . XARELTO 10 MG TABS tablet Take 1 tablet (10 mg total) by mouth daily. (Patient taking differently: Take 10 mg by mouth every evening. ) 30 tablet 12  . Zoledronic Acid (ZOMETA IV) Inject 4 mg into the vein every 3 (three) months. Receives at Dr. [facility].    .Marland Kitchenzolpidem (AMBIEN) 10 MG tablet Take 1 tablet (10 mg total) by mouth at bedtime as needed. 30 tablet 3   No current facility-administered medications on file prior to visit.     BP 117/67   Pulse 77   Temp 98.2 F (36.8 C) (Oral)   Resp 16   Ht _0  (1.702 m)   Wt 171 lb 12.8 oz (77.9 kg)   SpO2 99%   BMI 26.91 kg/m       Objective:   Physical Exam  General Mental Status- Alert. General Appearance- Not in acute distress.   Skin General: Color- Normal Color. Moisture- Normal Moisture.   Chest and Lung Exam Auscultation: Breath Sounds:-Normal. Clear, even and unlabored bilaterally.  Cardiovascular Auscultation:Rythm- Regular. Murmurs & Other Heart Sounds:Auscultation of the heart reveals- No Murmurs.  Abdomen Inspection:-Inspeection Normal. Palpation/Percussion:Note:No mass. Palpation and Percussion of the abdomen reveal- Non Tender, Non Distended + BS, no rebound or guarding.    Neurologic Cranial Nerve  exam:- CN III-XII intact(No nystagmus), symmetric smile. Strength:- 5/5 equal and symmetric strength both upper and lower extremities.      Assessment & Plan:  I am glad to hear you are feeling well now following hospitalization from probable viral pneumonia.  You will see your specialist before upcoming trip. I want you to check if they have plans on giving you psv 23. Our health maintenance does not show you had that. If you have not had this at [facility] they don't update you then you can get that in our office as nurse visit.   Also we could give you tdap as that is due according to our records.  Follow up October for flu vaccine or as needed  [employer] PA-C

## 2018-02-22 NOTE — Patient Instructions (Addendum)
I am glad to hear you are feeling well now following hospitalization from probable viral pneumonia.  You will see your specialist before upcoming trip. I want you to check if they have plans on giving you psv 23. Our health maintenance does not show you had that. If you have not had this at [facility] or they don't update you then you can get that in our office as nurse visit.   Also we could give you tdap as that is due according to our records.   Follow up October for flu vaccine or as needed

## 2018-02-24 DIAGNOSIS — C9001 Multiple myeloma in remission: Secondary | ICD-10-CM | POA: Diagnosis not present

## 2018-02-24 DIAGNOSIS — Z9481 Bone marrow transplant status: Secondary | ICD-10-CM | POA: Diagnosis not present

## 2018-02-24 DIAGNOSIS — C9000 Multiple myeloma not having achieved remission: Secondary | ICD-10-CM | POA: Diagnosis not present

## 2018-02-24 DIAGNOSIS — D649 Anemia, unspecified: Secondary | ICD-10-CM | POA: Diagnosis not present

## 2018-02-25 ENCOUNTER — Ambulatory Visit: Payer: 59 | Admitting: Physical Therapy

## 2018-02-25 ENCOUNTER — Encounter: Payer: Self-pay | Admitting: Physical Therapy

## 2018-02-25 DIAGNOSIS — M25511 Pain in right shoulder: Secondary | ICD-10-CM

## 2018-02-25 DIAGNOSIS — R293 Abnormal posture: Secondary | ICD-10-CM

## 2018-02-25 DIAGNOSIS — R252 Cramp and spasm: Secondary | ICD-10-CM

## 2018-02-25 DIAGNOSIS — M6281 Muscle weakness (generalized): Secondary | ICD-10-CM

## 2018-02-25 DIAGNOSIS — M25611 Stiffness of right shoulder, not elsewhere classified: Secondary | ICD-10-CM

## 2018-02-25 NOTE — Therapy (Signed)
[facility] [address]  [place], [place], [address] Phone: [phone]   Fax:  [phone]  Physical Therapy Treatment  Patient Details  Name: Jorge [name] MRN: [id number] Date of Birth: September 16, 1955 Referring Provider: Elsie Saas, MD   Encounter Date: 02/25/2018  PT End of Session - 02/25/18 1103    Visit Number  13    Number of Visits  20    Date for PT Re-Evaluation  03/30/18    Authorization Type  UHC    Authorization - Number of Visits  60    PT Start Time  1975    PT Stop Time  1054    PT Time Calculation (min)  39 min    Activity Tolerance  Patient tolerated treatment well    Behavior During Therapy  [facility] for tasks assessed/performed       Past Medical History:  Diagnosis Date  . Anxiety   . Bone metastasis ([place])   . Chest cold 05/19/2016   productive cough  -- started on antibiotic  . Chronic back pain    due to bone mets from myeloma  . Cough   . Depression   . GERD (gastroesophageal reflux disease)   . Hiatal hernia   . History of chicken pox   . History of concussion    age 62 -- no residual  . History of DVT of lower extremity 03/21/2016  treated and completed w/ xarelto   per doppler left extensive occlusion common femoral, femoral, and popliteal veins and right partial occlusion common femoral and profunda femoral veins/  last doppler 06-04-2016 no evidence acute or chronic dvt noted either leg   . History of radiation therapy 06/09/16-06/23/16   lower thoracic spine 25 Gy in 10 fractions  . Mouth ulcers    secondary to radiation  . Multiple myeloma ([place]) dx 02/22/2016 via bone marrow bx---  oncologist-  [name]   IgG Kappa-- Hyperdiploid/ +11 w/ bone mets--  current treatment chemotherapy (started 08/ 2017)and pallitive radiation to back started 06-09-2016  . Renal calculus, right   . Wears contact lenses     Past Surgical History:  Procedure Laterality Date  . COLONOSCOPY   [id number]  . CYSTOSCOPY W/ URETERAL STENT PLACEMENT Right 06/20/2016   Procedure: CYSTOSCOPY WITH STENT REPLACEMENT;  Surgeon: Kathie Rhodes, MD;  Location[facility];  Service: Urology;  Laterality: Right;  . CYSTOSCOPY WITH RETROGRADE PYELOGRAM, URETEROSCOPY AND STENT PLACEMENT Right 05/30/2016   Procedure: CYSTOSCOPY WITH RETROGRADE PYELOGRAM, URETEROSCOPY AND STENT PLACEMENT,DILITATION URETERAL STRICTURE;  Surgeon: Kathie Rhodes, MD;  Location: [facility];  Service: Urology;  Laterality: Right;  . CYSTOSCOPY/RETROGRADE/URETEROSCOPY/STONE EXTRACTION WITH BASKET Right 06/20/2016   Procedure: CYSTOSCOPY/URETEROSCOPY/STONE EXTRACTION WITH BASKET;  Surgeon: Kathie Rhodes, MD;  Location: [facility];  Service: Urology;  Laterality: Right;  . HOLMIUM LASER APPLICATION Right 88/09/2547   Procedure: HOLMIUM LASER APPLICATION;  Surgeon: Kathie Rhodes, MD;  Location: [facility];  Service: Urology;  Laterality: Right;  . IR GENERIC HISTORICAL  02/11/2016   IR RADIOLOGIST EVAL & MGMT 02/11/2016 [facility]  . IR GENERIC HISTORICAL  02/15/2016   IR BONE TUMOR(S)RF ABLATION 02/15/2016 Luanne Bras, MD [facility]  . IR GENERIC HISTORICAL  02/15/2016   IR BONE TUMOR(S)RF ABLATION 02/15/2016 Luanne Bras, MD [facility]  . IR GENERIC HISTORICAL  02/15/2016   IR BONE TUMOR(S)RF ABLATION 02/15/2016 Luanne Bras, MD [facility]  . IR GENERIC HISTORICAL  02/15/2016  IR KYPHO THORACIC WITH BONE BIOPSY 02/15/2016 Luanne Bras, MD [facility]  . IR GENERIC HISTORICAL  02/15/2016   IR KYPHO THORACIC WITH BONE BIOPSY 02/15/2016 Luanne Bras, MD [facility]  . IR GENERIC HISTORICAL  02/15/2016   IR VERTEBROPLASTY CERV/THOR BX INC UNI/BIL INC/INJECT/IMAGING 02/15/2016 Luanne Bras, MD [facility]  . IR GENERIC HISTORICAL  03/13/2016   IR KYPHO EA ADDL LEVEL THORACIC OR LUMBAR 03/13/2016 Luanne Bras, MD [facility]  . IR GENERIC HISTORICAL  03/13/2016    IR KYPHO EA ADDL LEVEL THORACIC OR LUMBAR 03/13/2016 Luanne Bras, MD [facility]  . IR GENERIC HISTORICAL  03/13/2016   IR BONE TUMOR(S)RF ABLATION 03/13/2016 Luanne Bras, MD [facility]  . IR GENERIC HISTORICAL  03/13/2016   IR KYPHO LUMBAR INC FX REDUCE BONE BX UNI/BIL CANNULATION INC/IMAGING 03/13/2016 Luanne Bras, MD [facility]  . IR GENERIC HISTORICAL  03/13/2016   IR BONE TUMOR(S)RF ABLATION 03/13/2016 Luanne Bras, MD [facility]  . IR GENERIC HISTORICAL  03/13/2016   IR BONE TUMOR(S)RF ABLATION 03/13/2016 Luanne Bras, MD [facility]  . IR GENERIC HISTORICAL  03/31/2016   IR RADIOLOGIST EVAL & MGMT 03/31/2016 [facility]  . LAPAROSCOPIC INGUINAL HERNIA REPAIR Bilateral 12-16-2013  dr gross  . RADIOLOGY WITH ANESTHESIA N/A 02/15/2016   Procedure: Spinal Ablation;  Surgeon: Luanne Bras, MD;  Location: [place];  Service: Radiology;  Laterality: N/A;  . RADIOLOGY WITH ANESTHESIA N/A 03/13/2016   Procedure: LUMBER ABLATION;  Surgeon: Luanne Bras, MD;  Location: [place];  Service: Radiology;  Laterality: N/A;  . ROTATOR CUFF REPAIR Right 2003  . TONSILLECTOMY  age 62  . WISDOM TOOTH EXTRACTION      There were no vitals filed for this visit.  Subjective Assessment - 02/25/18 1016    Subjective  Pt states that he feels good today but that his shoulders are a little sore from 62 vaccinations that he got in each arm yesterday. He states that his energy has remained up but that his back remains a little sore.     Pertinent History  multiple myeloma s/p stem cell transplant 08/22/2016 at [facility] - currently controlled but not in remission - follows up regularly with Dr. Marin Olp; multiple vertebral compression fractures with kyphoplasties and vertbroloplasties    Limitations  House hold activities;Lifting    Diagnostic tests  X-ray completed in MD office revealed some OA in R shoulder    Patient Stated Goals  "to have more movement in my arm, get rid of most of the  pain, and start strengthening my shoulder"    Currently in Pain?  Yes    Pain Score  4     Pain Location  Shoulder    Pain Orientation  Right    Pain Descriptors / Indicators  Aching;Dull;Laqueta Jean at a certain point when he moves quickly                       [facility] Adult PT Treatment/Exercise - 02/25/18 0001      Exercises   Exercises  Shoulder      Shoulder Exercises: Supine   Diagonals  --    Diagonals Limitations  --    Other Supine Exercises  R UE "snow angels" with VC for thumb facing up for better biomechanics and elbow flexion at end range to improve upper arm tissue extensibility       Shoulder Exercises: Prone   Other Prone Exercises  inverted bosu ball  sitting on mat table, pt using both UEs on handles of ball. 3 reps of Stealth bosu ball app x 1 min each       Shoulder Exercises: Standing   External Rotation  Both;10 reps    External Rotation Limitations  Against pool noodle against wall. VC for full ER then scapular retraction, and to squeeze the shoulder blades together to pinch pool noodle    Other Standing Exercises  Standing "robot" exercise - alternating UE IR/ER with each arm. Pool noodle along spine to cue for scapular retraction during exercise      Shoulder Exercises: Therapy Ball   Other Therapy Ball Exercises  Seated on Green PB; 10 reps of horizontal abduction with yellow Tband; 10 reps bilateral palloff press    Other Therapy Ball Exercises  Seated on green PB; 10 reps of shoulder extension with scapular retraction; yellow TB       Shoulder Exercises: ROM/Strengthening   UBE (Upper Arm Bike)  L2.5, 3 min foward, 3 min backward    Cybex Press  10 reps    Cybex Press Limitations  2 x 10 reps; 10# for first set, 15# for second set     Cybex Row  15 reps    Cybex Row Limitations  10# weight    Other ROM/Strengthening Exercises  Rhythmic stabilization holding orange PB against wall; 3 reps with 15-20 perterbations to ball from therapist  based on pt fatigue.       Shoulder Exercises: Body Blade   Other Body Blade Exercises  10 reps, D2 flexion/extension with body blade, 2 reps of focusing on just the motion, 8 reps with small self-perterbations                PT Short Term Goals - 12/24/17 1147      PT SHORT TERM GOAL #1   Title  independent with initial HEP    Status  Achieved        PT Long Term Goals - 02/16/18 1813      PT LONG TERM GOAL #1   Title  Independent with ongoing HEP +/- gym program     Status  Achieved      PT LONG TERM GOAL #2   Title  R shoulder AROM equivalent to L shoulder w/o pain provocation     Baseline  02/09/2018) Pain and limitation in ROM with IR    Status  On-going    Target Date  03/30/18      PT LONG TERM GOAL #3   Title  R shoulder strength >/= 4+/5 w/o pain on resistance    Status  Achieved      PT LONG TERM GOAL #4   Title  Patient to report ability to perform daily activities including ADLs, reaching and light lifting with R arm w/o pain provocation    Baseline  Pain with typing    Status  On-going    Target Date  03/30/18      PT LONG TERM GOAL #5   Title  Patient to return to working out w/o limitation due to R shoulder pain    Status  On-going    Target Date  03/30/18            Plan - 02/25/18 [place] continues to demonstrate improvements in his ROM and ability to recruit scapular musculature to improve his posture during therapeutic exercises and activities. Even so, he still experiences pain in  an impingement-like pattern, which impacts his ability to perform functional activities without pain. Session today focused on core stabilization and scapular stabilization with UE movement, to improve mechanics of the shoulder during UE movements. At this time he will continue to benefit from physical therapy to address these impairments and progress toward his functional goals.     Rehab Potential  Good    Clinical  Impairments Affecting Rehab Potential  multiple myeloma s/p stem cell transplant 08/22/2016 at [facility] - currently controlled but not in remission; multiple vertebral compression fractures with kyphoplasties and vertbroloplasties    PT Treatment/Interventions  Patient/family education;Therapeutic exercise;Therapeutic activities;Neuromuscular re-education;Manual techniques;Passive range of motion;Dry needling;Taping;Electrical Stimulation;Moist Heat;Cryotherapy;Vasopneumatic Device;Iontophoresis 35m/ml Dexamethasone;ADLs/Self Care Home Management    Consulted and Agree with Plan of Care  Patient       Patient will benefit from skilled therapeutic intervention in order to improve the following deficits and impairments:  Pain, Postural dysfunction, Decreased range of motion, Decreased strength, Increased muscle spasms, Impaired flexibility, Impaired UE functional use, Decreased activity tolerance, Abnormal gait  Visit Diagnosis: No diagnosis found.     Problem List Patient Active Problem List   Diagnosis Date Noted  . Infection due to human metapneumovirus (hMPV) 02/04/2018  . Neutropenia with fever ([place] 02/02/2018  . SOB (shortness of breath)   . Pancytopenia ([place] 02/01/2018  . Bacteremia 11/10/2017  . Sepsis ([place] 11/09/2017  . HCAP (healthcare-associated pneumonia) 11/09/2017  . Thrombocytopenia ([place] 11/09/2017  . Iron deficiency anemia due to chronic blood loss   . Neutropenia ([place] 03/02/2017  . Cellulitis 03/01/2017  . Phlebitis or thrombophlebitis of lower extremity 07/21/2016  . Visit for preventive health examination 05/16/2016  . Aphthous ulcer 05/16/2016  . Ureterolithiasis 05/16/2016  . Acute deep vein thrombosis (DVT) of femoral vein of left lower extremity ([place] 03/21/2016  . Multiple myeloma ([place] 02/22/2016  . Multiple myeloma not having achieved remission ([place] 02/22/2016  . Bilateral inguinal hernia (BIH), s/p lap repair 12/16/2013 11/02/2013    JShirline Frees SPT   02/25/2018, 11:24 AM  [facility] [place] [address]Phone: [phone]  Fax:  [phone] Name: MGustavus HaskinMRN: [id number] of Birth: 803/06/1956

## 2018-03-01 ENCOUNTER — Telehealth: Payer: Self-pay | Admitting: Medical

## 2018-03-01 ENCOUNTER — Ambulatory Visit: Payer: 59 | Admitting: Physical Therapy

## 2018-03-01 ENCOUNTER — Encounter: Payer: Self-pay | Admitting: Physical Therapy

## 2018-03-01 DIAGNOSIS — M25611 Stiffness of right shoulder, not elsewhere classified: Secondary | ICD-10-CM

## 2018-03-01 DIAGNOSIS — M25511 Pain in right shoulder: Secondary | ICD-10-CM

## 2018-03-01 DIAGNOSIS — R252 Cramp and spasm: Secondary | ICD-10-CM

## 2018-03-01 DIAGNOSIS — R293 Abnormal posture: Secondary | ICD-10-CM

## 2018-03-01 DIAGNOSIS — M6281 Muscle weakness (generalized): Secondary | ICD-10-CM

## 2018-03-01 MED ORDER — FLUTICASONE PROPIONATE 50 MCG/ACT NA SUSP: 2.0000 | Freq: Every day | NASAL | 1 refills | Status: DC

## 2018-03-01 NOTE — Telephone Encounter (Signed)
Pt. Requesting nasal spray for allergies. Routed to [place] to review.

## 2018-03-01 NOTE — Telephone Encounter (Signed)
Copied from [place]. Topic: Quick Communication - See Telephone Encounter >> Mar 01, 2018 10:58 AM Sheran Luz wrote: CRM for notification. See Telephone encounter for: 03/01/18.  Pt called requesting his PCP prescribe or recommend a nasal spray for allergies. Pt states that he is leaving the country and has previously discussed this with his PCP.

## 2018-03-01 NOTE — Therapy (Signed)
[facility] [address]  [place], [place], [address] Phone: [phone]   Fax:  [phone]  Physical Therapy Treatment  Patient Details  Name: Jorge [name] MRN: [id number] Date of Birth: Nov 24, 1955 Referring Provider: Elsie Saas, MD   Encounter Date: 03/01/2018  PT End of Session - 03/01/18 1107    Visit Number  14    Number of Visits  20    Date for PT Re-Evaluation  03/30/18    Authorization Type  UHC    Authorization - Number of Visits  60    PT Start Time  1107    PT Stop Time  1151    PT Time Calculation (min)  44 min    Activity Tolerance  Patient tolerated treatment well    Behavior During Therapy  [facility] for tasks assessed/performed       Past Medical History:  Diagnosis Date  . Anxiety   . Bone metastasis ([place])   . Chest cold 05/19/2016   productive cough  -- started on antibiotic  . Chronic back pain    due to bone mets from myeloma  . Cough   . Depression   . GERD (gastroesophageal reflux disease)   . Hiatal hernia   . History of chicken pox   . History of concussion    age 62 -- no residual  . History of DVT of lower extremity 03/21/2016  treated and completed w/ xarelto   per doppler left extensive occlusion common femoral, femoral, and popliteal veins and right partial occlusion common femoral and profunda femoral veins/  last doppler 06-04-2016 no evidence acute or chronic dvt noted either leg   . History of radiation therapy 06/09/16-06/23/16   lower thoracic spine 25 Gy in 10 fractions  . Mouth ulcers    secondary to radiation  . Multiple myeloma ([place]) dx 02/22/2016 via bone marrow bx---  oncologist-  [name]   IgG Kappa-- Hyperdiploid/ +11 w/ bone mets--  current treatment chemotherapy (started 08/ 2017)and pallitive radiation to back started 06-09-2016  . Renal calculus, right   . Wears contact lenses     Past Surgical History:  Procedure Laterality Date  . COLONOSCOPY   [id number]  . CYSTOSCOPY W/ URETERAL STENT PLACEMENT Right 06/20/2016   Procedure: CYSTOSCOPY WITH STENT REPLACEMENT;  Surgeon: Kathie Rhodes, MD;  [facility];  Service: Urology;  Laterality: Right;  . CYSTOSCOPY WITH RETROGRADE PYELOGRAM, URETEROSCOPY AND STENT PLACEMENT Right 05/30/2016   Procedure: CYSTOSCOPY WITH RETROGRADE PYELOGRAM, URETEROSCOPY AND STENT PLACEMENT,DILITATION URETERAL STRICTURE;  Surgeon: Kathie Rhodes, MD;  Location: [facility];  Service: Urology;  Laterality: Right;  . CYSTOSCOPY/RETROGRADE/URETEROSCOPY/STONE EXTRACTION WITH BASKET Right 06/20/2016   Procedure: CYSTOSCOPY/URETEROSCOPY/STONE EXTRACTION WITH BASKET;  Surgeon: Kathie Rhodes, MD;  Location: [facility];  Service: Urology;  Laterality: Right;  . HOLMIUM LASER APPLICATION Right 00/09/7046   Procedure: HOLMIUM LASER APPLICATION;  Surgeon: Kathie Rhodes, MD;  Location: [facility];  Service: Urology;  Laterality: Right;  . IR GENERIC HISTORICAL  02/11/2016   IR RADIOLOGIST EVAL & MGMT 02/11/2016 [facility]  . IR GENERIC HISTORICAL  02/15/2016   IR BONE TUMOR(S)RF ABLATION 02/15/2016 Luanne Bras, MD [facility]  . IR GENERIC HISTORICAL  02/15/2016   IR BONE TUMOR(S)RF ABLATION 02/15/2016 Luanne Bras, MD [facility]  . IR GENERIC HISTORICAL  02/15/2016   IR BONE TUMOR(S)RF ABLATION 02/15/2016 Luanne Bras, MD [facility]  . IR GENERIC HISTORICAL  02/15/2016  IR KYPHO THORACIC WITH BONE BIOPSY 02/15/2016 Luanne Bras, MD [facility]  . IR GENERIC HISTORICAL  02/15/2016   IR KYPHO THORACIC WITH BONE BIOPSY 02/15/2016 Luanne Bras, MD [facility]  . IR GENERIC HISTORICAL  02/15/2016   IR VERTEBROPLASTY CERV/THOR BX INC UNI/BIL INC/INJECT/IMAGING 02/15/2016 Luanne Bras, MD [facility]  . IR GENERIC HISTORICAL  03/13/2016   IR KYPHO EA ADDL LEVEL THORACIC OR LUMBAR 03/13/2016 Luanne Bras, MD [facility]  . IR GENERIC HISTORICAL  03/13/2016    IR KYPHO EA ADDL LEVEL THORACIC OR LUMBAR 03/13/2016 Luanne Bras, MD [facility]  . IR GENERIC HISTORICAL  03/13/2016   IR BONE TUMOR(S)RF ABLATION 03/13/2016 Luanne Bras, MD [facility]  . IR GENERIC HISTORICAL  03/13/2016   IR KYPHO LUMBAR INC FX REDUCE BONE BX UNI/BIL CANNULATION INC/IMAGING 03/13/2016 Luanne Bras, MD [facility]  . IR GENERIC HISTORICAL  03/13/2016   IR BONE TUMOR(S)RF ABLATION 03/13/2016 Luanne Bras, MD [facility]  . IR GENERIC HISTORICAL  03/13/2016   IR BONE TUMOR(S)RF ABLATION 03/13/2016 Luanne Bras, MD [facility]  . IR GENERIC HISTORICAL  03/31/2016   IR RADIOLOGIST EVAL & MGMT 03/31/2016 [facility]  . LAPAROSCOPIC INGUINAL HERNIA REPAIR Bilateral 12-16-2013  dr gross  . RADIOLOGY WITH ANESTHESIA N/A 02/15/2016   Procedure: Spinal Ablation;  Surgeon: Luanne Bras, MD;  Location: [place];  Service: Radiology;  Laterality: N/A;  . RADIOLOGY WITH ANESTHESIA N/A 03/13/2016   Procedure: LUMBER ABLATION;  Surgeon: Luanne Bras, MD;  Location: [place];  Service: Radiology;  Laterality: N/A;  . ROTATOR CUFF REPAIR Right 2003  . TONSILLECTOMY  age 36  . WISDOM TOOTH EXTRACTION      There were no vitals filed for this visit.  Subjective Assessment - 03/01/18 1114    Pertinent History  multiple myeloma s/p stem cell transplant 08/22/2016 at [facility] - currently controlled but not in remission - follows up regularly with Dr. Marin Olp; multiple vertebral compression fractures with kyphoplasties and vertbroloplasties    Limitations  House hold activities;Lifting    Diagnostic tests  X-ray completed in MD office revealed some OA in R shoulder    Patient Stated Goals  "to have more movement in my arm, get rid of most of the pain, and start strengthening my shoulder"    Currently in Pain?  Yes    Pain Score  1     Pain Location  Shoulder    Pain Orientation  Right;Anterior    Pain Descriptors / Indicators  Dull;Aching    Pain Type  Chronic pain                        [facility] Adult PT Treatment/Exercise - 03/01/18 1107      Exercises   Exercises  Shoulder      Shoulder Exercises: Standing   External Rotation  Both;15 reps;Theraband;Strengthening    Theraband Level (Shoulder External Rotation)  Level 2 (Red)    External Rotation Limitations  shoulders at 90dg abduction    Internal Rotation  Both;15 reps;Theraband;Strengthening    Theraband Level (Shoulder Internal Rotation)  Level 2 (Red)    Internal Rotation Limitations  shoulders at 90dg abduction    Diagonals  Right;15 reps;Theraband    Theraband Level (Shoulder Diagonals)  Level 3 (Green)    Diagonals Limitations  D1 & D2 flexion/extension    Other Standing Exercises  Wall clocks x 10 reps with each UE to 3 positions with Green TB around wrists  Shoulder Exercises: ROM/Strengthening   UBE (Upper Arm Bike)  L3.0 x 6 min (3' fwd/3' back)    Wall Pushups  15 reps    Wall Pushups Limitations  push-up + on orange Pball      Iontophoresis   Type of Iontophoresis  Dexamethasone    Location  R anterior shoulder (over coracoid process)    Dose  80 mA-min, 1.0 mL    Time  4-6 hr patch (#4 of 6)      Manual Therapy   Manual Therapy  Joint mobilization;Soft tissue mobilization;Passive ROM;Muscle Energy Technique    Manual therapy comments  hooklying    Joint Mobilization  R shoulder Inferior and A/P glenohumeral mobs - grade 3    Soft tissue mobilization  R lateral pecs & posterior shoulder complex, esp teres group    Passive ROM  R shoulder MWM into IR & ER    Muscle Energy Technique  Contract/relax for R shoulder IR & ER               PT Short Term Goals - 12/24/17 1147      PT SHORT TERM GOAL #1   Title  independent with initial HEP    Status  Achieved        PT Long Term Goals - 02/16/18 1813      PT LONG TERM GOAL #1   Title  Independent with ongoing HEP +/- gym program     Status  Achieved      PT LONG TERM GOAL #2   Title  R  shoulder AROM equivalent to L shoulder w/o pain provocation     Baseline  02/09/2018) Pain and limitation in ROM with IR    Status  On-going    Target Date  03/30/18      PT LONG TERM GOAL #3   Title  R shoulder strength >/= 4+/5 w/o pain on resistance    Status  Achieved      PT LONG TERM GOAL #4   Title  Patient to report ability to perform daily activities including ADLs, reaching and light lifting with R arm w/o pain provocation    Baseline  Pain with typing    Status  On-going    Target Date  03/30/18      PT LONG TERM GOAL #5   Title  Patient to return to working out w/o limitation due to R shoulder pain    Status  On-going    Target Date  03/30/18            Plan - 03/01/18 1151    Clinical Impression Statement  [name] reporting minimal R shoulder pain today but continues to note limitations with ability to reach into his back pocket for his wallet or push/pull something heavy with his right arm. He remains compliant with HEP, reporting he continues to progress reps and/or resistance bands with most exercises performed at home with green TB. Able to increase R shoulder ROM in IR & ER with manual therapy today with pt noting improved ability to reach behind his back by end of session but still noting pain/discomfort in anterior shoulder, therefore placed ionto patch over this area at end of session.    Rehab Potential  Good    Clinical Impairments Affecting Rehab Potential  multiple myeloma s/p stem cell transplant 08/22/2016 at [facility] - currently controlled but not in remission; multiple vertebral compression fractures with kyphoplasties and vertbroloplasties    PT Treatment/Interventions  Patient/family  education;Therapeutic exercise;Therapeutic activities;Neuromuscular re-education;Manual techniques;Passive range of motion;Dry needling;Taping;Electrical Stimulation;Moist Heat;Cryotherapy;Vasopneumatic Device;Iontophoresis 31m/ml Dexamethasone;ADLs/Self Care Home Management     Consulted and Agree with Plan of Care  Patient       Patient will benefit from skilled therapeutic intervention in order to improve the following deficits and impairments:  Pain, Postural dysfunction, Decreased range of motion, Decreased strength, Increased muscle spasms, Impaired flexibility, Impaired UE functional use, Decreased activity tolerance, Abnormal gait  Visit Diagnosis: Acute pain of right shoulder  Stiffness of right shoulder, not elsewhere classified  Abnormal posture  Muscle weakness (generalized)  Cramp and spasm     Problem List Patient Active Problem List   Diagnosis Date Noted  . Infection due to human metapneumovirus (hMPV) 02/04/2018  . Neutropenia with fever (HW[place] 02/02/2018  . SOB (shortness of breath)   . Pancytopenia ([place] 02/01/2018  . Bacteremia 11/10/2017  . Sepsis (HD[place] 11/09/2017  . HCAP (healthcare-associated pneumonia) 11/09/2017  . Thrombocytopenia (HA[place] 11/09/2017  . Iron deficiency anemia due to chronic blood loss   . Neutropenia (H[place] 03/02/2017  . Cellulitis 03/01/2017  . Phlebitis or thrombophlebitis of lower extremity 07/21/2016  . Visit for preventive health examination 05/16/2016  . Aphthous ulcer 05/16/2016  . Ureterolithiasis 05/16/2016  . Acute deep vein thrombosis (DVT) of femoral vein of left lower extremity (HB[place] 03/21/2016  . Multiple myeloma (HCl[place] 02/22/2016  . Multiple myeloma not having achieved remission (HChamp[place] 02/22/2016  . Bilateral inguinal hernia (BIH), s/p lap repair 12/16/2013 11/02/2013    JPercival Spanish PT, MPT 03/01/2018, 12:47 PM  [facility] [place]point[place] [address]Phone: [phone]  Fax:  [phone] Name: MJencarlo BonadonnaMRN: [id number] of Birth: 804-18-1957

## 2018-03-01 NOTE — Telephone Encounter (Signed)
Prescription of flonase sent to pt pharmacy.

## 2018-03-02 ENCOUNTER — Inpatient Hospital Stay: Payer: 59

## 2018-03-02 ENCOUNTER — Other Ambulatory Visit: Payer: Self-pay

## 2018-03-02 ENCOUNTER — Inpatient Hospital Stay (HOSPITAL_BASED_OUTPATIENT_CLINIC_OR_DEPARTMENT_OTHER): Payer: 59 | Admitting: Hematology & Oncology

## 2018-03-02 VITALS — BP 128/86 | HR 70 | Temp 98.2°F | Resp 17 | Wt 173.1 lb

## 2018-03-02 DIAGNOSIS — Z86718 Personal history of other venous thrombosis and embolism: Secondary | ICD-10-CM | POA: Diagnosis not present

## 2018-03-02 DIAGNOSIS — C9000 Multiple myeloma not having achieved remission: Secondary | ICD-10-CM

## 2018-03-02 DIAGNOSIS — Z79899 Other long term (current) drug therapy: Secondary | ICD-10-CM

## 2018-03-02 DIAGNOSIS — D500 Iron deficiency anemia secondary to blood loss (chronic): Secondary | ICD-10-CM

## 2018-03-02 DIAGNOSIS — Z7901 Long term (current) use of anticoagulants: Secondary | ICD-10-CM

## 2018-03-02 DIAGNOSIS — D509 Iron deficiency anemia, unspecified: Secondary | ICD-10-CM

## 2018-03-02 LAB — CMP (CANCER CENTER ONLY)
ALBUMIN: 3.4 g/dL — AB (ref 3.5–5.0)
ALK PHOS: 65 U/L (ref 26–84)
ALT: 37 U/L (ref 10–47)
AST: 36 U/L (ref 11–38)
Anion gap: 5 (ref 5–15)
BUN: 15 mg/dL (ref 7–22)
CO2: 26 mmol/L (ref 18–33)
CREATININE: 0.9 mg/dL (ref 0.60–1.20)
Calcium: 8.7 mg/dL (ref 8.0–10.3)
Chloride: 106 mmol/L (ref 98–108)
Glucose, Bld: 122 mg/dL — ABNORMAL HIGH (ref 73–118)
Potassium: 4.1 mmol/L (ref 3.3–4.7)
SODIUM: 137 mmol/L (ref 128–145)
TOTAL PROTEIN: 6.4 g/dL (ref 6.4–8.1)
Total Bilirubin: 0.4 mg/dL (ref 0.2–1.6)

## 2018-03-02 LAB — CBC WITH DIFFERENTIAL (CANCER CENTER ONLY)
BASOS PCT: 0 %
Basophils Absolute: 0 10*3/uL (ref 0.0–0.1)
EOS ABS: 0.1 10*3/uL (ref 0.0–0.5)
Eosinophils Relative: 5 %
HCT: 36.6 % — ABNORMAL LOW (ref 38.7–49.9)
HEMOGLOBIN: 11.6 g/dL — AB (ref 13.0–17.1)
LYMPHS ABS: 1 10*3/uL (ref 0.9–3.3)
Lymphocytes Relative: 38 %
MCH: 29.6 pg (ref 28.0–33.4)
MCHC: 31.7 g/dL — ABNORMAL LOW (ref 32.0–35.9)
MCV: 93.4 fL (ref 82.0–98.0)
Monocytes Absolute: 0.4 10*3/uL (ref 0.1–0.9)
Monocytes Relative: 15 %
NEUTROS PCT: 42 %
Neutro Abs: 1 10*3/uL — ABNORMAL LOW (ref 1.5–6.5)
Platelet Count: 148 10*3/uL (ref 145–400)
RBC: 3.92 MIL/uL — ABNORMAL LOW (ref 4.20–5.70)
WBC Count: 2.5 10*3/uL — ABNORMAL LOW (ref 4.0–10.0)

## 2018-03-02 MED ORDER — SODIUM CHLORIDE 0.9 % IV SOLN
Freq: Once | INTRAVENOUS | Status: AC
  Administered 2018-03-02: 10:00:00 via INTRAVENOUS
  Filled 2018-03-02: qty 250

## 2018-03-02 MED ORDER — AMOXICILLIN-POT CLAVULANATE 875-125 MG PO TABS: 1.0000 | ORAL_TABLET | Freq: Two times a day (BID) | ORAL | 0 refills | Status: DC

## 2018-03-02 MED ORDER — ZOLEDRONIC ACID 4 MG/100ML IV SOLN
4.0000 mg | Freq: Once | INTRAVENOUS | Status: AC
  Administered 2018-03-02: 4 mg via INTRAVENOUS
  Filled 2018-03-02: qty 100

## 2018-03-02 NOTE — Progress Notes (Signed)
Hematology and Oncology Follow Up Visit  Jorge [name] [id number] 08/11/1955 62 y.o. 03/02/2018   Principle Diagnosis:  IgG Kappa myeloma - Hyperdiploid/+11 DVT of the LEFT and RIGHT leg  Iron deficiency anemia  Current Therapy:   Revlimid 10 mg po q day (21 on/7 off) - on hold,   Zometa 4 mg IV q 3 months- next dose is 05/2018 Xarelto 10 mg PO daily IV Iron as needed   Interim History:  Jorge [name] is here today with his wife for follow-up.  He feels a lot better being off the Revlimid.  He also feels a whole lot better now that we gave him IV iron.  We last saw him, his iron studies showed marked iron deficiency.  His ferritin was only 34 with an iron saturation of 4%.  He got 2 doses of IV iron.  This really helped quite a bit.  He did see his transplant doctor at [facility].  He actually saw the PA.  She was little bit worried about the blood counts.  She thought that he needed to have his blood counts checked after he got back from his [place] vacation.  When we saw him in late July, his M spike was not found.  His IgG level was 884 mg/dL.  His Kappa Light chain was actually a little better at 2.7 mg/dL.  He and his wife are getting ready to go on the [place] trip next Tuesday.  I think that he will be okay for this.  I did call in an antibiotic that he can take.  I called in some Augmentin for him to use in case he does have a problem.  His white cell count came up a little bit more.  His ANC is 1000.  I will think this would be adequate for his trip.  He has had no fever.  Overall, his performance status is ECOG 0  Medications:  Allergies as of 03/02/2018   No Known Allergies     Medication List        Accurate as of 03/02/18 10:07 AM. Always use your most recent med list.          acetaminophen 325 MG tablet Commonly known as:  TYLENOL Take 2 tablets (650 mg total) by mouth every 6 (six) hours as needed for mild pain (or Fever >/= 101).   albuterol 108 (90 Base)  MCG/ACT inhaler Commonly known as:  PROVENTIL HFA;VENTOLIN HFA Inhale 2 puffs into the lungs every 6 (six) hours as needed for wheezing or shortness of breath.   amoxicillin-clavulanate 875-125 MG tablet Commonly known as:  AUGMENTIN Take 1 tablet by mouth 2 (two) times daily.   benzonatate 200 MG capsule Commonly known as:  TESSALON Take 1 capsule (200 mg total) by mouth 3 (three) times daily as needed for cough.   cetirizine 10 MG tablet Commonly known as:  ZYRTEC Take 10 mg by mouth daily as needed for allergies.   cholecalciferol 1000 units tablet Commonly known as:  VITAMIN D Take 1,000 Units by mouth daily.   famciclovir 500 MG tablet Commonly known as:  FAMVIR Take 1 tablet (500 mg total) by mouth daily.   fluticasone 50 MCG/ACT nasal spray Commonly known as:  FLONASE Place 2 sprays into both nostrils daily.   lidocaine 5 % Commonly known as:  LIDODERM PLAEC 1 PATCH ONTO THE SKIN DAILY as needed for pain. APPLY 1 PATCH TO THE MOST PAINFUL AREA FOR 12 HR IN A 24 HR PERIOD  magnesium oxide 400 MG tablet Commonly known as:  MAG-OX Take 400 mg by mouth daily.   ondansetron 8 MG disintegrating tablet Commonly known as:  ZOFRAN-ODT Take 1 tablet (8 mg total) by mouth every 8 (eight) hours as needed.   OVER THE COUNTER MEDICATION Take 1 tablet by mouth daily.   polyethylene glycol powder powder Commonly known as:  GLYCOLAX/MIRALAX Take 17 g by mouth daily.   PROBIOTIC PO Take 1 capsule by mouth daily.   rOPINIRole 0.25 MG tablet Commonly known as:  REQUIP TAKE 1 TABLET BY MOUTH 1 TO 3 HOURS BEFORE BED FOR 2 DAYS THEN INCREASE TO 2 TABS   senna 8.6 MG Tabs tablet Commonly known as:  SENOKOT Take 2 tablets (17.2 mg total) by mouth daily.   sertraline 100 MG tablet Commonly known as:  ZOLOFT Take 100 mg by mouth at bedtime.   XARELTO 10 MG Tabs tablet Generic drug:  rivaroxaban Take 1 tablet (10 mg total) by mouth daily.   zolpidem 10 MG  tablet Commonly known as:  AMBIEN Take 1 tablet (10 mg total) by mouth at bedtime as needed.   ZOMETA IV Inject 4 mg into the vein every 3 (three) months. Receives at [name] office.       Allergies: No Known Allergies  Past Medical History, Surgical history, Social history, and Family History were reviewed and updated.  Review of Systems: Review of Systems  Constitutional: Negative.   HENT: Negative.   Eyes: Negative.   Respiratory: Negative.   Cardiovascular: Negative.   Gastrointestinal: Negative.   Genitourinary: Negative.   Musculoskeletal: Positive for myalgias.  Skin: Negative.   Neurological: Negative.   Endo/Heme/Allergies: Negative.   Psychiatric/Behavioral: Negative.      Physical Exam:  weight is 173 lb 2 oz (78.5 kg). His oral temperature is 98.2 F (36.8 C). His blood pressure is 128/86 and his pulse is 70. His respiration is 17 and oxygen saturation is 100%.   Wt Readings from Last 3 Encounters:  03/02/18 173 lb 2 oz (78.5 kg)  02/22/18 171 lb 12.8 oz (77.9 kg)  02/10/18 172 lb 8 oz (78.2 kg)    Physical Exam  Constitutional: He is oriented to person, place, and time.  HENT:  Head: Normocephalic and atraumatic.  Mouth/Throat: Oropharynx is clear and moist.  Eyes: Pupils are equal, round, and reactive to light. EOM are normal.  Neck: Normal range of motion.  Cardiovascular: Normal rate, regular rhythm and normal heart sounds.  Pulmonary/Chest: Effort normal and breath sounds normal.  Abdominal: Soft. Bowel sounds are normal.  Musculoskeletal: Normal range of motion. He exhibits no edema, tenderness or deformity.  Lymphadenopathy:    He has no cervical adenopathy.  Neurological: He is alert and oriented to person, place, and time.  Skin: Skin is warm and dry. No rash noted. No erythema.  Psychiatric: He has a normal mood and affect. His behavior is normal. Judgment and thought content normal.  Vitals reviewed.    Lab Results  Component  Value Date   WBC 2.5 (L) 03/02/2018   HGB 11.6 (L) 03/02/2018   HCT 36.6 (L) 03/02/2018   MCV 93.4 03/02/2018   PLT 148 03/02/2018   Lab Results  Component Value Date   FERRITIN 34 02/10/2018   IRON 22 (L) 02/10/2018   TIBC 495 (H) 02/10/2018   UIBC 473 02/10/2018   IRONPCTSAT 4 (L) 02/10/2018   Lab Results  Component Value Date   RETICCTPCT 1.1 03/05/2017   RBC 3.92 (  L) 03/02/2018   Lab Results  Component Value Date   KPAFRELGTCHN 26.8 (H) 02/10/2018   LAMBDASER 13.1 02/10/2018   KAPLAMBRATIO 2.05 (H) 02/10/2018   Lab Results  Component Value Date   IGGSERUM 884 02/10/2018   IGA 245 02/10/2018   IGMSERUM 29 02/10/2018   Lab Results  Component Value Date   TOTALPROTELP 6.5 02/10/2018   ALBUMINELP 3.5 02/10/2018   A1GS 0.3 02/10/2018   A2GS 0.7 02/10/2018   BETS 1.1 02/10/2018   GAMS 0.9 02/10/2018   MSPIKE Not Observed 02/10/2018   SPEI Comment 01/05/2018     Chemistry      Component Value Date/Time   NA 137 03/02/2018 0843   NA 143 06/30/2017 1049   NA 140 10/03/2016 0845   K 4.1 03/02/2018 0843   K 4.0 06/30/2017 1049   K 4.3 10/03/2016 0845   CL 106 03/02/2018 0843   CL 105 06/30/2017 1049   CO2 26 03/02/2018 0843   CO2 27 06/30/2017 1049   CO2 26 10/03/2016 0845   BUN 15 03/02/2018 0843   BUN 13 06/30/2017 1049   BUN 11.9 10/03/2016 0845   CREATININE 0.90 03/02/2018 0843   CREATININE 1.0 06/30/2017 1049   CREATININE 0.9 10/03/2016 0845      Component Value Date/Time   CALCIUM 8.7 03/02/2018 0843   CALCIUM 9.0 06/30/2017 1049   CALCIUM 9.9 10/03/2016 0845   ALKPHOS 65 03/02/2018 0843   ALKPHOS 57 06/30/2017 1049   ALKPHOS 80 10/03/2016 0845   AST 36 03/02/2018 0843   AST 28 10/03/2016 0845   ALT 37 03/02/2018 0843   ALT 43 06/30/2017 1049   ALT 26 10/03/2016 0845   BILITOT 0.4 03/02/2018 0843   BILITOT 0.48 10/03/2016 0845      Impression and Plan: Mr. Kroening is a pleasant 62 yo gentleman with IgG kappa myeloma.  He ultimately  underwent a autologous stem cell transplant at [facility] in February 2018.  We will keep the Revlimid on hold for right now.  I think that this would be prudent given his [place] vacation.  I do not want to see him have problems with infection over an year because of low white cells.  His iron clearly helped his anemia.  I feel much better regarding his blood work.  I do not think that there is any myelodysplasia.  We will get him back in about 4-5 weeks.  He will have come back from his vacation and we can recheck his blood counts.  [name], MD 8/20/201910:07 AM

## 2018-03-02 NOTE — Progress Notes (Signed)
Okay to give Zometa one month early per Dr. Marin Olp. Patient usually gets at 3 months intervals.

## 2018-03-02 NOTE — Patient Instructions (Signed)
Zoledronic Acid injection (Hypercalcemia, Oncology) (Zometa) What is this medicine? ZOLEDRONIC ACID (ZOE le dron ik AS id) lowers the amount of calcium loss from bone. It is used to treat too much calcium in your blood from cancer. It is also used to prevent complications of cancer that has spread to the bone. This medicine may be used for other purposes; ask your health care provider or pharmacist if you have questions. COMMON BRAND NAME(S): Zometa What should I tell my health care provider before I take this medicine? They need to know if you have any of these conditions: -aspirin-sensitive asthma -cancer, especially if you are receiving medicines used to treat cancer -dental disease or wear dentures -infection -kidney disease -receiving corticosteroids like dexamethasone or prednisone -an unusual or allergic reaction to zoledronic acid, other medicines, foods, dyes, or preservatives -pregnant or trying to get pregnant -breast-feeding How should I use this medicine? This medicine is for infusion into a vein. It is given by a health care professional in a hospital or clinic setting. Talk to your pediatrician regarding the use of this medicine in children. Special care may be needed. Overdosage: If you think you have taken too much of this medicine contact a poison control center or emergency room at once. NOTE: This medicine is only for you. Do not share this medicine with others. What if I miss a dose? It is important not to miss your dose. Call your doctor or health care professional if you are unable to keep an appointment. What may interact with this medicine? -certain antibiotics given by injection -NSAIDs, medicines for pain and inflammation, like ibuprofen or naproxen -some diuretics like bumetanide, furosemide -teriparatide -thalidomide This list may not describe all possible interactions. Give your health care provider a list of all the medicines, herbs, non-prescription drugs,  or dietary supplements you use. Also tell them if you smoke, drink alcohol, or use illegal drugs. Some items may interact with your medicine. What should I watch for while using this medicine? Visit your doctor or health care professional for regular checkups. It may be some time before you see the benefit from this medicine. Do not stop taking your medicine unless your doctor tells you to. Your doctor may order blood tests or other tests to see how you are doing. Women should inform their doctor if they wish to become pregnant or think they might be pregnant. There is a potential for serious side effects to an unborn child. Talk to your health care professional or pharmacist for more information. You should make sure that you get enough calcium and vitamin D while you are taking this medicine. Discuss the foods you eat and the vitamins you take with your health care professional. Some people who take this medicine have severe bone, joint, and/or muscle pain. This medicine may also increase your risk for jaw problems or a broken thigh bone. Tell your doctor right away if you have severe pain in your jaw, bones, joints, or muscles. Tell your doctor if you have any pain that does not go away or that gets worse. Tell your dentist and dental surgeon that you are taking this medicine. You should not have major dental surgery while on this medicine. See your dentist to have a dental exam and fix any dental problems before starting this medicine. Take good care of your teeth while on this medicine. Make sure you see your dentist for regular follow-up appointments. What side effects may I notice from receiving this medicine? Side effects   that you should report to your doctor or health care professional as soon as possible: -allergic reactions like skin rash, itching or hives, swelling of the face, lips, or tongue -anxiety, confusion, or depression -breathing problems -changes in vision -eye pain -feeling faint  or lightheaded, falls -jaw pain, especially after dental work -mouth sores -muscle cramps, stiffness, or weakness -redness, blistering, peeling or loosening of the skin, including inside the mouth -trouble passing urine or change in the amount of urine Side effects that usually do not require medical attention (report to your doctor or health care professional if they continue or are bothersome): -bone, joint, or muscle pain -constipation -diarrhea -fever -hair loss -irritation at site where injected -loss of appetite -nausea, vomiting -stomach upset -trouble sleeping -trouble swallowing -weak or tired This list may not describe all possible side effects. Call your doctor for medical advice about side effects. You may report side effects to FDA at 1-800-FDA-1088. Where should I keep my medicine? This drug is given in a hospital or clinic and will not be stored at home. NOTE: This sheet is a summary. It may not cover all possible information. If you have questions about this medicine, talk to your doctor, pharmacist, or health care provider.  2018 Elsevier/Gold Standard (2013-11-26 14:19:39)  

## 2018-03-03 LAB — KAPPA/LAMBDA LIGHT CHAINS
KAPPA FREE LGHT CHN: 21.2 mg/L — AB (ref 3.3–19.4)
Kappa, lambda light chain ratio: 2.38 — ABNORMAL HIGH (ref 0.26–1.65)
LAMDA FREE LIGHT CHAINS: 8.9 mg/L (ref 5.7–26.3)

## 2018-03-03 LAB — IGG, IGA, IGM
IGA: 225 mg/dL (ref 61–437)
IGG (IMMUNOGLOBIN G), SERUM: 814 mg/dL (ref 700–1600)
IGM (IMMUNOGLOBULIN M), SRM: 24 mg/dL (ref 20–172)

## 2018-03-04 ENCOUNTER — Ambulatory Visit: Payer: 59 | Admitting: Physical Therapy

## 2018-03-04 ENCOUNTER — Encounter: Payer: Self-pay | Admitting: Physical Therapy

## 2018-03-04 DIAGNOSIS — R252 Cramp and spasm: Secondary | ICD-10-CM

## 2018-03-04 DIAGNOSIS — M25511 Pain in right shoulder: Secondary | ICD-10-CM | POA: Diagnosis not present

## 2018-03-04 DIAGNOSIS — R293 Abnormal posture: Secondary | ICD-10-CM

## 2018-03-04 DIAGNOSIS — M25611 Stiffness of right shoulder, not elsewhere classified: Secondary | ICD-10-CM

## 2018-03-04 DIAGNOSIS — M6281 Muscle weakness (generalized): Secondary | ICD-10-CM

## 2018-03-04 NOTE — Therapy (Signed)
[facility] [address]  [place], [place], [address] Phone: [phone]   Fax:  [phone]  Physical Therapy Treatment  Patient Details  Name: Jorge [name] MRN: [id number] Date of Birth: [birth year] Referring Provider: Elsie Saas, MD   Encounter Date: 03/04/2018  PT End of Session - 03/04/18 1101    Visit Number  15    Number of Visits  20    Date for PT Re-Evaluation  03/30/18    Authorization Type  [employer]    Authorization - Number of Visits  60    PT Start Time  1101    PT Stop Time  1145    PT Time Calculation (min)  44 min    Activity Tolerance  Patient tolerated treatment well    Behavior During Therapy  [facility] for tasks assessed/performed       Past Medical History:  Diagnosis Date  . Anxiety   . Bone metastasis ([place])   . Chest cold 05/19/2016   productive cough  -- started on antibiotic  . Chronic back pain    due to bone mets from myeloma  . Cough   . Depression   . GERD (gastroesophageal reflux disease)   . Hiatal hernia   . History of chicken pox   . History of concussion    age 62 -- no residual  . History of DVT of lower extremity 03/21/2016  treated and completed w/ xarelto   per doppler left extensive occlusion common femoral, femoral, and popliteal veins and right partial occlusion common femoral and profunda femoral veins/  last doppler 06-04-2016 no evidence acute or chronic dvt noted either leg   . History of radiation therapy 06/09/16-06/23/16   lower thoracic spine 25 Gy in 10 fractions  . Mouth ulcers    secondary to radiation  . Multiple myeloma ([place]) dx 02/22/2016 via bone marrow bx---  oncologist-  [name]   IgG Kappa-- Hyperdiploid/ +11 w/ bone mets--  current treatment chemotherapy (started 08/ 2017)and pallitive radiation to back started 06-09-2016  . Renal calculus, right   . Wears contact lenses     Past Surgical History:  Procedure Laterality Date  . COLONOSCOPY   [id number]  . CYSTOSCOPY W/ URETERAL STENT PLACEMENT Right 06/20/2016   Procedure: CYSTOSCOPY WITH STENT REPLACEMENT;  Surgeon: Kathie Rhodes, MD;  Location: [facility];  Service: Urology;  Laterality: Right;  . CYSTOSCOPY WITH RETROGRADE PYELOGRAM, URETEROSCOPY AND STENT PLACEMENT Right 05/30/2016   Procedure: CYSTOSCOPY WITH RETROGRADE PYELOGRAM, URETEROSCOPY AND STENT PLACEMENT,DILITATION URETERAL STRICTURE;  Surgeon: Kathie Rhodes, MD;  Location: [facility];  Service: Urology;  Laterality: Right;  . CYSTOSCOPY/RETROGRADE/URETEROSCOPY/STONE EXTRACTION WITH BASKET Right 06/20/2016   Procedure: CYSTOSCOPY/URETEROSCOPY/STONE EXTRACTION WITH BASKET;  Surgeon: Kathie Rhodes, MD;  Location: [facility];  Service: Urology;  Laterality: Right;  . HOLMIUM LASER APPLICATION Right 21/07/9415   Procedure: HOLMIUM LASER APPLICATION;  Surgeon: Kathie Rhodes, MD;  Location: [facility];  Service: Urology;  Laterality: Right;  . IR GENERIC HISTORICAL  02/11/2016   IR RADIOLOGIST EVAL & MGMT 02/11/2016 [facility]  . IR GENERIC HISTORICAL  02/15/2016   IR BONE TUMOR(S)RF ABLATION 02/15/2016 Luanne Bras, MD [facility]  . IR GENERIC HISTORICAL  02/15/2016   IR BONE TUMOR(S)RF ABLATION 02/15/2016 Luanne Bras, MD [facility]  . IR GENERIC HISTORICAL  02/15/2016   IR BONE TUMOR(S)RF ABLATION 02/15/2016 Luanne Bras, MD [facility]  . IR GENERIC HISTORICAL  02/15/2016  IR KYPHO THORACIC WITH BONE BIOPSY 02/15/2016 Luanne Bras, MD [facility]  . IR GENERIC HISTORICAL  02/15/2016   IR KYPHO THORACIC WITH BONE BIOPSY 02/15/2016 Luanne Bras, MD [facility]  . IR GENERIC HISTORICAL  02/15/2016   IR VERTEBROPLASTY CERV/THOR BX INC UNI/BIL INC/INJECT/IMAGING 02/15/2016 Luanne Bras, MD [facility]  . IR GENERIC HISTORICAL  03/13/2016   IR KYPHO EA ADDL LEVEL THORACIC OR LUMBAR 03/13/2016 Luanne Bras, MD [facility]  . IR GENERIC HISTORICAL  03/13/2016    IR KYPHO EA ADDL LEVEL THORACIC OR LUMBAR 03/13/2016 Luanne Bras, MD [facility]  . IR GENERIC HISTORICAL  03/13/2016   IR BONE TUMOR(S)RF ABLATION 03/13/2016 Luanne Bras, MD [facility]  . IR GENERIC HISTORICAL  03/13/2016   IR KYPHO LUMBAR INC FX REDUCE BONE BX UNI/BIL CANNULATION INC/IMAGING 03/13/2016 Luanne Bras, MD [facility]  . IR GENERIC HISTORICAL  03/13/2016   IR BONE TUMOR(S)RF ABLATION 03/13/2016 Luanne Bras, MD [facility]  . IR GENERIC HISTORICAL  03/13/2016   IR BONE TUMOR(S)RF ABLATION 03/13/2016 Luanne Bras, MD [facility]  . IR GENERIC HISTORICAL  03/31/2016   IR RADIOLOGIST EVAL & MGMT 03/31/2016 [facility]  . LAPAROSCOPIC INGUINAL HERNIA REPAIR Bilateral 12-16-2013  dr gross  . RADIOLOGY WITH ANESTHESIA N/A 02/15/2016   Procedure: Spinal Ablation;  Surgeon: Luanne Bras, MD;  Location: [facility];  Service: Radiology;  Laterality: N/A;  . RADIOLOGY WITH ANESTHESIA N/A 03/13/2016   Procedure: LUMBER ABLATION;  Surgeon: Luanne Bras, MD;  Location: [facility];  Service: Radiology;  Laterality: N/A;  . ROTATOR CUFF REPAIR Right 2003  . TONSILLECTOMY  age 62  . WISDOM TOOTH EXTRACTION      There were no vitals filed for this visit.  Subjective Assessment - 03/04/18 1108    Subjective  Pt reporting increased posterior R shoulder pain today whcih he thinks may be due to the diagonals with TB.    Pertinent History  multiple myeloma s/p stem cell transplant 08/22/2016 at [facility] - currently controlled but not in remission - follows up regularly with Dr. Marin Olp; multiple vertebral compression fractures with kyphoplasties and vertbroloplasties    Diagnostic tests  X-ray completed in MD office revealed some OA in R shoulder    Patient Stated Goals  "to have more movement in my arm, get rid of most of the pain, and start strengthening my shoulder"    Currently in Pain?  Yes    Pain Score  3     Pain Location  Shoulder    Pain Orientation   Right;Posterior;Anterior    Pain Descriptors / Indicators  Aching    Pain Type  Chronic pain    Pain Frequency  Intermittent                       [facility] Adult PT Treatment/Exercise - 03/04/18 1101      Exercises   Exercises  Shoulder      Shoulder Exercises: Standing   Horizontal ABduction  Both;15 reps;Theraband;Strengthening    Theraband Level (Shoulder Horizontal ABduction)  Level 2 (Red)    Horizontal ABduction Limitations  standing against 1/2 FR on wall for scapular activation input    External Rotation  Both;15 reps;Theraband;Strengthening    External Rotation Limitations  neutral shoulder with scap retraction into 1/2 FR    Diagonals  Both;Strengthening;15 reps    Theraband Level (Shoulder Diagonals)  Level 2 (Red)    Diagonals Limitations  horiz abduction with alternating diagonals  Manual Therapy   Manual Therapy  Soft tissue mobilization;Myofascial release;Scapular mobilization;Taping    Manual therapy comments  prone    Soft tissue mobilization  STM & DTM to R posterior shoulder complex    Myofascial Release  manual TRP to R infraspinatus & terese group    Scapular Mobilization  R scapula inf/med & med rotation glides    Kinesiotex  Inhibit Muscle      Kinesiotix   Inhibit Muscle   R shoulder - 30% supra/infraspinatus & teres minor       Trigger Point Dry Needling - 03/04/18 1101    Muscles Treated Upper Body  Infraspinatus   Teres group & lats   Infraspinatus Response  Twitch response elicited;Palpable increased muscle length   Rt            PT Short Term Goals - 12/24/17 1147      PT SHORT TERM GOAL #1   Title  independent with initial HEP    Status  Achieved        PT Long Term Goals - 02/16/18 1813      PT LONG TERM GOAL #1   Title  Independent with ongoing HEP +/- gym program     Status  Achieved      PT LONG TERM GOAL #2   Title  R shoulder AROM equivalent to L shoulder w/o pain provocation     Baseline   02/09/2018) Pain and limitation in ROM with IR    Status  On-going    Target Date  03/30/18      PT LONG TERM GOAL #3   Title  R shoulder strength >/= 4+/5 w/o pain on resistance    Status  Achieved      PT LONG TERM GOAL #4   Title  Patient to report ability to perform daily activities including ADLs, reaching and light lifting with R arm w/o pain provocation    Baseline  Pain with typing    Status  On-going    Target Date  03/30/18      PT LONG TERM GOAL #5   Title  Patient to return to working out w/o limitation due to R shoulder pain    Status  On-going    Target Date  03/30/18            Plan - 03/04/18 1145    Clinical Impression Statement  Mike noting increased pain in R posterior shoulder which he attributes to the theraband PNF diagonals which he had self progressed to green TB. Increased muscle tension and TPS indentified in R infraspinatus & teres group wchich were addressed with manual therapy and DN with positive twitch response/palpable reduction in muscle tension and pt reporting improved flexibilty and less pain. Kinesiotape applied to reinforce muscle relaxation. Therapeutic exercises targeting scapular activation to reduce shoulder protraction while activating RTC musculature with pt reporting no pain or issues with this. Pt preparing to leave for extended international travel and will return in mid Sept, therefore reviewed exercises for pt to focus on while traveling.    Rehab Potential  Good    Clinical Impairments Affecting Rehab Potential  multiple myeloma s/p stem cell transplant 08/22/2016 at [facility] - currently controlled but not in remission; multiple vertebral compression fractures with kyphoplasties and vertbroloplasties    PT Treatment/Interventions  Patient/family education;Therapeutic exercise;Therapeutic activities;Neuromuscular re-education;Manual techniques;Passive range of motion;Dry needling;Taping;Electrical Stimulation;Moist  Heat;Cryotherapy;Vasopneumatic Device;Iontophoresis 4mg/ml Dexamethasone;ADLs/Self Care Home Management    Consulted and Agree with Plan of Care    Patient       Patient will benefit from skilled therapeutic intervention in order to improve the following deficits and impairments:  Pain, Postural dysfunction, Decreased range of motion, Decreased strength, Increased muscle spasms, Impaired flexibility, Impaired UE functional use, Decreased activity tolerance, Abnormal gait  Visit Diagnosis: Acute pain of right shoulder  Stiffness of right shoulder, not elsewhere classified  Abnormal posture  Muscle weakness (generalized)  Cramp and spasm     Problem List Patient Active Problem List   Diagnosis Date Noted  . Infection due to human metapneumovirus (hMPV) 02/04/2018  . Neutropenia with fever ([place]) 02/02/2018  . SOB (shortness of breath)   . Pancytopenia ([place]) 02/01/2018  . Bacteremia 11/10/2017  . Sepsis ([place]) 11/09/2017  . HCAP (healthcare-associated pneumonia) 11/09/2017  . Thrombocytopenia ([place]) 11/09/2017  . Iron deficiency anemia due to chronic blood loss   . Neutropenia ([place]) 03/02/2017  . Cellulitis 03/01/2017  . Phlebitis or thrombophlebitis of lower extremity 07/21/2016  . Visit for preventive health examination 05/16/2016  . Aphthous ulcer 05/16/2016  . Ureterolithiasis 05/16/2016  . Acute deep vein thrombosis (DVT) of femoral vein of left lower extremity ([place]) 03/21/2016  . Multiple myeloma ([place]) 02/22/2016  . Multiple myeloma not having achieved remission ([place]) 02/22/2016  . Bilateral inguinal hernia (BIH), s/p lap repair 12/16/2013 11/02/2013    Percival Spanish, PT, MPT 03/04/2018, 12:03 PM  [facility] [address]  [place], [place], [address] Phone: [phone]   Fax:  [phone]  Name: Jorge [name] MRN: [id number] Date of Birth: [birth year]

## 2018-03-05 LAB — PROTEIN ELECTROPHORESIS, SERUM, WITH REFLEX
A/G Ratio: 1.6 (ref 0.7–1.7)
ALBUMIN ELP: 3.9 g/dL (ref 2.9–4.4)
Alpha-1-Globulin: 0.2 g/dL (ref 0.0–0.4)
Alpha-2-Globulin: 0.5 g/dL (ref 0.4–1.0)
Beta Globulin: 1 g/dL (ref 0.7–1.3)
GAMMA GLOBULIN: 0.7 g/dL (ref 0.4–1.8)
Globulin, Total: 2.4 g/dL (ref 2.2–3.9)
SPEP Interpretation: 0
TOTAL PROTEIN ELP: 6.3 g/dL (ref 6.0–8.5)

## 2018-03-05 LAB — IMMUNOFIXATION REFLEX, SERUM
IgA: 246 mg/dL (ref 61–437)
IgG (Immunoglobin G), Serum: 880 mg/dL (ref 700–1600)
IgM (Immunoglobulin M), Srm: 27 mg/dL (ref 20–172)

## 2018-03-08 ENCOUNTER — Encounter: Payer: 59 | Admitting: Physical Therapy

## 2018-03-09 ENCOUNTER — Ambulatory Visit: Payer: 59

## 2018-03-09 ENCOUNTER — Ambulatory Visit: Payer: 59 | Admitting: Hematology & Oncology

## 2018-03-09 ENCOUNTER — Other Ambulatory Visit: Payer: 59

## 2018-03-26 ENCOUNTER — Ambulatory Visit: Payer: 59 | Attending: Orthopedic Surgery | Admitting: Physical Therapy

## 2018-03-26 DIAGNOSIS — M25511 Pain in right shoulder: Secondary | ICD-10-CM | POA: Insufficient documentation

## 2018-03-26 DIAGNOSIS — M25611 Stiffness of right shoulder, not elsewhere classified: Secondary | ICD-10-CM | POA: Insufficient documentation

## 2018-03-26 DIAGNOSIS — R293 Abnormal posture: Secondary | ICD-10-CM | POA: Insufficient documentation

## 2018-03-26 DIAGNOSIS — M6281 Muscle weakness (generalized): Secondary | ICD-10-CM | POA: Insufficient documentation

## 2018-03-26 DIAGNOSIS — R252 Cramp and spasm: Secondary | ICD-10-CM | POA: Insufficient documentation

## 2018-03-31 ENCOUNTER — Encounter: Payer: Self-pay | Admitting: Physical Therapy

## 2018-03-31 ENCOUNTER — Ambulatory Visit: Payer: 59 | Admitting: Physical Therapy

## 2018-03-31 DIAGNOSIS — M6281 Muscle weakness (generalized): Secondary | ICD-10-CM

## 2018-03-31 DIAGNOSIS — M25511 Pain in right shoulder: Secondary | ICD-10-CM

## 2018-03-31 DIAGNOSIS — R252 Cramp and spasm: Secondary | ICD-10-CM | POA: Diagnosis present

## 2018-03-31 DIAGNOSIS — R293 Abnormal posture: Secondary | ICD-10-CM

## 2018-03-31 DIAGNOSIS — M25611 Stiffness of right shoulder, not elsewhere classified: Secondary | ICD-10-CM

## 2018-03-31 NOTE — Therapy (Signed)
[facility] [address]  [place], [place], [address] Phone: [phone]   Fax:  [phone]  Physical Therapy Treatment  Patient Details  Name: Jorge [name] MRN: [id number] Date of Birth: 05-Sep-1955 Referring Provider: Elsie Saas, MD   Encounter Date: 03/31/2018  PT End of Session - 03/31/18 0845    Visit Number  16    Number of Visits  20    Date for PT Re-Evaluation  04/16/18    Authorization Type  UHC    Authorization - Number of Visits  60    PT Start Time  0845    PT Stop Time  0930    PT Time Calculation (min)  45 min    Activity Tolerance  Patient tolerated treatment well    Behavior During Therapy  [facility] for tasks assessed/performed       Past Medical History:  Diagnosis Date  . Anxiety   . Bone metastasis ([place])   . Chest cold 05/19/2016   productive cough  -- started on antibiotic  . Chronic back pain    due to bone mets from myeloma  . Cough   . Depression   . GERD (gastroesophageal reflux disease)   . Hiatal hernia   . History of chicken pox   . History of concussion    age 40 -- no residual  . History of DVT of lower extremity 03/21/2016  treated and completed w/ xarelto   per doppler left extensive occlusion common femoral, femoral, and popliteal veins and right partial occlusion common femoral and profunda femoral veins/  last doppler 06-04-2016 no evidence acute or chronic dvt noted either leg   . History of radiation therapy 06/09/16-06/23/16   lower thoracic spine 25 Gy in 10 fractions  . Mouth ulcers    secondary to radiation  . Multiple myeloma ([place]) dx 02/22/2016 via bone marrow bx---  oncologist-  [name]   IgG Kappa-- Hyperdiploid/ +11 w/ bone mets--  current treatment chemotherapy (started 08/ 2017)and pallitive radiation to back started 06-09-2016  . Renal calculus, right   . Wears contact lenses     Past Surgical History:  Procedure Laterality Date  . COLONOSCOPY   [id number]  . CYSTOSCOPY W/ URETERAL STENT PLACEMENT Right 06/20/2016   Procedure: CYSTOSCOPY WITH STENT REPLACEMENT;  Surgeon: Kathie Rhodes, MD;  Location: [facility];  Service: Urology;  Laterality: Right;  . CYSTOSCOPY WITH RETROGRADE PYELOGRAM, URETEROSCOPY AND STENT PLACEMENT Right 05/30/2016   Procedure: CYSTOSCOPY WITH RETROGRADE PYELOGRAM, URETEROSCOPY AND STENT PLACEMENT,DILITATION URETERAL STRICTURE;  Surgeon: Kathie Rhodes, MD;  Location: [facility];  Service: Urology;  Laterality: Right;  . CYSTOSCOPY/RETROGRADE/URETEROSCOPY/STONE EXTRACTION WITH BASKET Right 06/20/2016   Procedure: CYSTOSCOPY/URETEROSCOPY/STONE EXTRACTION WITH BASKET;  Surgeon: Kathie Rhodes, MD;  [facility];  Service: Urology;  Laterality: Right;  . HOLMIUM LASER APPLICATION Right 31/11/1759   Procedure: HOLMIUM LASER APPLICATION;  Surgeon: Kathie Rhodes, MD;  Location: [facility];  Service: Urology;  Laterality: Right;  . IR GENERIC HISTORICAL  02/11/2016   IR RADIOLOGIST EVAL & MGMT 02/11/2016 [facility]  . IR GENERIC HISTORICAL  02/15/2016   IR BONE TUMOR(S)RF ABLATION 02/15/2016 Luanne Bras, MD [facility]  . IR GENERIC HISTORICAL  02/15/2016   IR BONE TUMOR(S)RF ABLATION 02/15/2016 Luanne Bras, MD [facility]  . IR GENERIC HISTORICAL  02/15/2016   IR BONE TUMOR(S)RF ABLATION 02/15/2016 Luanne Bras, MD [facility]  . IR GENERIC HISTORICAL  02/15/2016  IR KYPHO THORACIC WITH BONE BIOPSY 02/15/2016 Luanne Bras, MD [facility]  . IR GENERIC HISTORICAL  02/15/2016   IR KYPHO THORACIC WITH BONE BIOPSY 02/15/2016 Luanne Bras, MD [facility]  . IR GENERIC HISTORICAL  02/15/2016   IR VERTEBROPLASTY CERV/THOR BX INC UNI/BIL INC/INJECT/IMAGING 02/15/2016 Luanne Bras, MD [facility]  . IR GENERIC HISTORICAL  03/13/2016   IR KYPHO EA ADDL LEVEL THORACIC OR LUMBAR 03/13/2016 Luanne Bras, MD [facility]  . IR GENERIC HISTORICAL  03/13/2016    IR KYPHO EA ADDL LEVEL THORACIC OR LUMBAR 03/13/2016 Luanne Bras, MD [facility]  . IR GENERIC HISTORICAL  03/13/2016   IR BONE TUMOR(S)RF ABLATION 03/13/2016 Luanne Bras, MD [facility]  . IR GENERIC HISTORICAL  03/13/2016   IR KYPHO LUMBAR INC FX REDUCE BONE BX UNI/BIL CANNULATION INC/IMAGING 03/13/2016 Luanne Bras, MD [facility]  . IR GENERIC HISTORICAL  03/13/2016   IR BONE TUMOR(S)RF ABLATION 03/13/2016 Luanne Bras, MD [facility]  . IR GENERIC HISTORICAL  03/13/2016   IR BONE TUMOR(S)RF ABLATION 03/13/2016 Luanne Bras, MD [facility]  . IR GENERIC HISTORICAL  03/31/2016   IR RADIOLOGIST EVAL & MGMT 03/31/2016 [facility]  . LAPAROSCOPIC INGUINAL HERNIA REPAIR Bilateral 12-16-2013  dr gross  . RADIOLOGY WITH ANESTHESIA N/A 02/15/2016   Procedure: Spinal Ablation;  Surgeon: Luanne Bras, MD;  Location: [facility];  Service: Radiology;  Laterality: N/A;  . RADIOLOGY WITH ANESTHESIA N/A 03/13/2016   Procedure: LUMBER ABLATION;  Surgeon: Luanne Bras, MD;  Location: [place];  Service: Radiology;  Laterality: N/A;  . ROTATOR CUFF REPAIR Right 2003  . TONSILLECTOMY  age 42  . WISDOM TOOTH EXTRACTION      There were no vitals filed for this visit.  Subjective Assessment - 03/31/18 0849    Subjective  Pt returning from traveling internationally over the past few weeks. Notes shoulder pain intermittently during the trip.    Pertinent History  multiple myeloma s/p stem cell transplant 08/22/2016 at [facility] - currently controlled but not in remission - follows up regularly with Dr. Marin Olp; multiple vertebral compression fractures with kyphoplasties and vertbroloplasties    Diagnostic tests  X-ray completed in MD office revealed some OA in R shoulder    Patient Stated Goals  "to have more movement in my arm, get rid of most of the pain, and start strengthening my shoulder"    Currently in Pain?  Yes    Pain Score  1     Pain Location  Shoulder    Pain Orientation   Right;Anterior;Posterior    Pain Descriptors / Indicators  Dull    Pain Type  Chronic pain    Pain Frequency  Intermittent         [facility] PT Assessment - 03/31/18 0845      Assessment   Medical Diagnosis  R shoulder OA & RTC tendonitis    Referring Provider  Elsie Saas, MD    Onset Date/Surgical Date  --   Feb 2019   Hand Dominance  Right    Next MD Visit  PRN      AROM   Right Shoulder Flexion  144 Degrees    Right Shoulder ABduction  142 Degrees    Right Shoulder Internal Rotation  74 Degrees    Right Shoulder External Rotation  68 Degrees      Strength   Right Shoulder Flexion  4+/5    Right Shoulder ABduction  4+/5    Right Shoulder Internal Rotation  4+/5  Right Shoulder External Rotation  4+/5    Left Shoulder Flexion  5/5    Left Shoulder ABduction  5/5    Left Shoulder Internal Rotation  5/5    Left Shoulder External Rotation  5/5                   [facility] Adult PT Treatment/Exercise - 03/31/18 0845      Exercises   Exercises  Shoulder      Shoulder Exercises: ROM/Strengthening   UBE (Upper Arm Bike)  L3.0 x 6 min (3' fwd/3' back)      Shoulder Exercises: Stretch   Corner Stretch  30 seconds;1 rep   each     Manual Therapy   Manual Therapy  Soft tissue mobilization;Myofascial release;Scapular mobilization    Soft tissue mobilization  STM & DTM to R pecs, ant deltoid, biceps & posterior shoulder complex    Myofascial Release  manual TPR to R pec major, biceps, anterior deltoid & teres group       Trigger Point Dry Needling - 03/31/18 0845    Consent Given?  Yes    Muscles Treated Upper Body  Pectoralis major   + biceps, anterior deltoid & teres group on R   Pectoralis Major Response  Twitch response elicited;Palpable increased muscle length             PT Short Term Goals - 12/24/17 1147      PT SHORT TERM GOAL #1   Title  independent with initial HEP    Status  Achieved        PT Long Term Goals - 03/31/18 0853       PT LONG TERM GOAL #1   Title  Independent with ongoing HEP +/- gym program     Status  Achieved      PT LONG TERM GOAL #2   Title  R shoulder AROM equivalent to L shoulder w/o pain provocation     Status  Partially Met      PT LONG TERM GOAL #3   Title  R shoulder strength >/= 4+/5 w/o pain on resistance    Status  Achieved      PT LONG TERM GOAL #4   Title  Patient to report ability to perform daily activities including ADLs, reaching and light lifting with R arm w/o pain provocation    Status  Partially Met      PT LONG TERM GOAL #5   Title  Patient to return to working out w/o limitation due to R shoulder pain    Status  Partially Met            Plan - 03/31/18 0930    Clinical Impression Statement  Jorge [name] returning to PT after ~3 week absence while traveling, noting only minimal R shoulder pain while traveling although also noting less time spent on computer/tablet during travels. R shoulder ROM now symmetrical to L in flexion & abduction with improvements also noted IR/ER, although some restriction/tightness persists in functional IR > ER. R shoulder strength significantly improved with only very slight weakness noted relative to L. All goals at least partially met at this time. Pt will continue to benefit from skilled PT to further reduce muscle tension in R shoulder complex to promote improved shoulder alignment and decrease risk for impingement and associated shoulder pain.    Rehab Potential  Good    Clinical Impairments Affecting Rehab Potential  multiple myeloma s/p stem cell transplant 08/22/2016 at [facility] -  currently controlled but not in remission; multiple vertebral compression fractures with kyphoplasties and vertbroloplasties    PT Treatment/Interventions  Patient/family education;Therapeutic exercise;Therapeutic activities;Neuromuscular re-education;Manual techniques;Passive range of motion;Dry needling;Taping;Electrical Stimulation;Moist Heat;Cryotherapy;Vasopneumatic  Device;Iontophoresis 79m/ml Dexamethasone;ADLs/Self Care Home Management    Consulted and Agree with Plan of Care  Patient       Patient will benefit from skilled therapeutic intervention in order to improve the following deficits and impairments:  Pain, Postural dysfunction, Decreased range of motion, Decreased strength, Increased muscle spasms, Impaired flexibility, Impaired UE functional use, Decreased activity tolerance, Abnormal gait  Visit Diagnosis: Acute pain of right shoulder  Stiffness of right shoulder, not elsewhere classified  Abnormal posture  Muscle weakness (generalized)  Cramp and spasm     Problem List Patient Active Problem List   Diagnosis Date Noted  . Infection due to human metapneumovirus (hMPV) 02/04/2018  . Neutropenia with fever ([place] 02/02/2018  . SOB (shortness of breath)   . Pancytopenia ([place] 02/01/2018  . Bacteremia 11/10/2017  . Sepsis ([place] 11/09/2017  . HCAP (healthcare-associated pneumonia) 11/09/2017  . Thrombocytopenia ([place] 11/09/2017  . Iron deficiency anemia due to chronic blood loss   . Neutropenia ([place] 03/02/2017  . Cellulitis 03/01/2017  . Phlebitis or thrombophlebitis of lower extremity 07/21/2016  . Visit for preventive health examination 05/16/2016  . Aphthous ulcer 05/16/2016  . Ureterolithiasis 05/16/2016  . Acute deep vein thrombosis (DVT) of femoral vein of left lower extremity ([place] 03/21/2016  . Multiple myeloma ([place] 02/22/2016  . Multiple myeloma not having achieved remission ([place] 02/22/2016  . Bilateral inguinal hernia (BIH), s/p lap repair 12/16/2013 11/02/2013    JPercival Spanish PT, MPT 03/31/2018, 2:54 PM  [facility] [place] [address]Phone: [phone]  Fax:  [phone] Name: MDolphus LinchMRN: [id number] of Birth: [birth year]

## 2018-04-05 ENCOUNTER — Ambulatory Visit: Payer: 59 | Admitting: Physical Therapy

## 2018-04-05 ENCOUNTER — Encounter: Payer: Self-pay | Admitting: Physical Therapy

## 2018-04-05 DIAGNOSIS — R293 Abnormal posture: Secondary | ICD-10-CM

## 2018-04-05 DIAGNOSIS — R252 Cramp and spasm: Secondary | ICD-10-CM

## 2018-04-05 DIAGNOSIS — M25511 Pain in right shoulder: Secondary | ICD-10-CM

## 2018-04-05 DIAGNOSIS — M6281 Muscle weakness (generalized): Secondary | ICD-10-CM

## 2018-04-05 DIAGNOSIS — M25611 Stiffness of right shoulder, not elsewhere classified: Secondary | ICD-10-CM

## 2018-04-05 NOTE — Therapy (Signed)
[facility] [address]  [place], [place], [address] Phone: [phone]   Fax:  [phone]  Physical Therapy Treatment  Patient Details  Name: Jorge [name] MRN: [id number] Date of Birth: 1956/06/28 Referring Provider: Elsie Saas, MD   Encounter Date: 04/05/2018  PT End of Session - 04/05/18 1620    Visit Number  17    Number of Visits  20    Date for PT Re-Evaluation  04/16/18    Authorization Type  UHC    Authorization - Number of Visits  60    PT Start Time  1620    PT Stop Time  1658    PT Time Calculation (min)  38 min    Activity Tolerance  Patient tolerated treatment well    Behavior During Therapy  [facility] for tasks assessed/performed       Past Medical History:  Diagnosis Date  . Anxiety   . Bone metastasis ([place])   . Chest cold 05/19/2016   productive cough  -- started on antibiotic  . Chronic back pain    due to bone mets from myeloma  . Cough   . Depression   . GERD (gastroesophageal reflux disease)   . Hiatal hernia   . History of chicken pox   . History of concussion    age 36 -- no residual  . History of DVT of lower extremity 03/21/2016  treated and completed w/ xarelto   per doppler left extensive occlusion common femoral, femoral, and popliteal veins and right partial occlusion common femoral and profunda femoral veins/  last doppler 06-04-2016 no evidence acute or chronic dvt noted either leg   . History of radiation therapy 06/09/16-06/23/16   lower thoracic spine 25 Gy in 10 fractions  . Mouth ulcers    secondary to radiation  . Multiple myeloma ([place]) dx 02/22/2016 via bone marrow bx---  oncologist-  [name]   IgG Kappa-- Hyperdiploid/ +11 w/ bone mets--  current treatment chemotherapy (started 08/ 2017)and pallitive radiation to back started 06-09-2016  . Renal calculus, right   . Wears contact lenses     Past Surgical History:  Procedure Laterality Date  . COLONOSCOPY   [id number]  . CYSTOSCOPY W/ URETERAL STENT PLACEMENT Right 06/20/2016   Procedure: CYSTOSCOPY WITH STENT REPLACEMENT;  Surgeon: Kathie Rhodes, MD;  Location: [facility];  Service: Urology;  Laterality: Right;  . CYSTOSCOPY WITH RETROGRADE PYELOGRAM, URETEROSCOPY AND STENT PLACEMENT Right 05/30/2016   Procedure: CYSTOSCOPY WITH RETROGRADE PYELOGRAM, URETEROSCOPY AND STENT PLACEMENT,DILITATION URETERAL STRICTURE;  Surgeon: Kathie Rhodes, MD;  [facility];  Service: Urology;  Laterality: Right;  . CYSTOSCOPY/RETROGRADE/URETEROSCOPY/STONE EXTRACTION WITH BASKET Right 06/20/2016   Procedure: CYSTOSCOPY/URETEROSCOPY/STONE EXTRACTION WITH BASKET;  Surgeon: Kathie Rhodes, MD;  Location: [facility];  Service: Urology;  Laterality: Right;  . HOLMIUM LASER APPLICATION Right 20/09/5595   Procedure: HOLMIUM LASER APPLICATION;  Surgeon: Kathie Rhodes, MD;  Location: [facility];  Service: Urology;  Laterality: Right;  . IR GENERIC HISTORICAL  02/11/2016   IR RADIOLOGIST EVAL & MGMT 02/11/2016 [facility]  . IR GENERIC HISTORICAL  02/15/2016   IR BONE TUMOR(S)RF ABLATION 02/15/2016 Luanne Bras, MD [facility]  . IR GENERIC HISTORICAL  02/15/2016   IR BONE TUMOR(S)RF ABLATION 02/15/2016 Luanne Bras, MD [facility]  . IR GENERIC HISTORICAL  02/15/2016   IR BONE TUMOR(S)RF ABLATION 02/15/2016 Luanne Bras, MD [facility]  . IR GENERIC HISTORICAL  02/15/2016  IR KYPHO THORACIC WITH BONE BIOPSY 02/15/2016 Luanne Bras, MD [facility]  . IR GENERIC HISTORICAL  02/15/2016   IR KYPHO THORACIC WITH BONE BIOPSY 02/15/2016 Luanne Bras, MD [facility]  . IR GENERIC HISTORICAL  02/15/2016   IR VERTEBROPLASTY CERV/THOR BX INC UNI/BIL INC/INJECT/IMAGING 02/15/2016 Luanne Bras, MD [facility]  . IR GENERIC HISTORICAL  03/13/2016   IR KYPHO EA ADDL LEVEL THORACIC OR LUMBAR 03/13/2016 Luanne Bras, MD [facility]  . IR GENERIC HISTORICAL  03/13/2016    IR KYPHO EA ADDL LEVEL THORACIC OR LUMBAR 03/13/2016 Luanne Bras, MD [facility]  . IR GENERIC HISTORICAL  03/13/2016   IR BONE TUMOR(S)RF ABLATION 03/13/2016 Luanne Bras, MD [facility]  . IR GENERIC HISTORICAL  03/13/2016   IR KYPHO LUMBAR INC FX REDUCE BONE BX UNI/BIL CANNULATION INC/IMAGING 03/13/2016 Luanne Bras, MD [facility]  . IR GENERIC HISTORICAL  03/13/2016   IR BONE TUMOR(S)RF ABLATION 03/13/2016 Luanne Bras, MD [facility]  . IR GENERIC HISTORICAL  03/13/2016   IR BONE TUMOR(S)RF ABLATION 03/13/2016 Luanne Bras, MD [facility]  . IR GENERIC HISTORICAL  03/31/2016   IR RADIOLOGIST EVAL & MGMT 03/31/2016 [facility]  . LAPAROSCOPIC INGUINAL HERNIA REPAIR Bilateral 12-16-2013  dr gross  . RADIOLOGY WITH ANESTHESIA N/A 02/15/2016   Procedure: Spinal Ablation;  Surgeon: Luanne Bras, MD;  Location: [facility];  Service: Radiology;  Laterality: N/A;  . RADIOLOGY WITH ANESTHESIA N/A 03/13/2016   Procedure: LUMBER ABLATION;  Surgeon: Luanne Bras, MD;  Location: [facility];  Service: Radiology;  Laterality: N/A;  . ROTATOR CUFF REPAIR Right 2003  . TONSILLECTOMY  age 36  . WISDOM TOOTH EXTRACTION      There were no vitals filed for this visit.  Subjective Assessment - 04/05/18 1623    Subjective  Pt arrives to PT walking with pronounced limp - reports he was walking his dog ~30 minutes before coming to PT and when he got home had sudden onset of pain in arch of L foot w/o known MOI. Tried icing w/o releif.  Shoulder w/o pain today - pt feels like manual & DN last visit "made a big difference".    Pertinent History  multiple myeloma s/p stem cell transplant 08/22/2016 at [facility] - currently controlled but not in remission - follows up regularly with Dr. Marin Olp; multiple vertebral compression fractures with kyphoplasties and vertbroloplasties    Diagnostic tests  X-ray completed in MD office revealed some OA in R shoulder    Patient Stated Goals  "to have more  movement in my arm, get rid of most of the pain, and start strengthening my shoulder"    Currently in Pain?  No/denies                       [facility] Adult PT Treatment/Exercise - 04/05/18 1619      Exercises   Exercises  Shoulder      Shoulder Exercises: Prone   Extension  Both;15 reps;Weights;Strengthening    Extension Weight (lbs)  2    Extension Limitations  I's prone over green Pball, kneeling on edge of mat table    External Rotation  Both;15 reps;Weights;Strengthening    External Rotation Weight (lbs)  2    Horizontal ABduction 1  Both;15 reps;Weights;Strengthening    Horizontal ABduction 1 Weight (lbs)  2    Horizontal ABduction 1 Limitations  T's prone over green Pball, kneeling on edge of mat table    Horizontal ABduction 2  Both;15 reps;Weights;Strengthening    Horizontal ABduction 2 Weight (lbs)  2    Horizontal ABduction 2 Limitations  Y's prone over green Pball, kneeling on edge of mat table    Other Prone Exercises  Plank from knees with hands on inverted BOSU - protraction rocks L/R x20; push-up plus x15      Shoulder Exercises: ROM/Strengthening   UBE (Upper Arm Bike)  L3.0 x 6 min (3' fwd/3' back)      Manual Therapy   Manual Therapy  Soft tissue mobilization;Myofascial release;Scapular mobilization    Manual therapy comments  supine    Soft tissue mobilization  STM & DTM to R pecs, ant deltoid, biceps & posterior shoulder complex    Myofascial Release  manual TPR to R pec major & anterior deltoid               PT Short Term Goals - 12/24/17 1147      PT SHORT TERM GOAL #1   Title  independent with initial HEP    Status  Achieved        PT Long Term Goals - 03/31/18 0853      PT LONG TERM GOAL #1   Title  Independent with ongoing HEP +/- gym program     Status  Achieved      PT LONG TERM GOAL #2   Title  R shoulder AROM equivalent to L shoulder w/o pain provocation     Status  Partially Met      PT LONG TERM GOAL #3   Title   R shoulder strength >/= 4+/5 w/o pain on resistance    Status  Achieved      PT LONG TERM GOAL #4   Title  Patient to report ability to perform daily activities including ADLs, reaching and light lifting with R arm w/o pain provocation    Status  Partially Met      PT LONG TERM GOAL #5   Title  Patient to return to working out w/o limitation due to R shoulder pain    Status  Partially Met            Plan - 04/05/18 1625    Clinical Impression Statement  Jorge [name] noting good relief from manual therapy including DN last visit, with no shoulder pain reported today. Therapeutic exercises modified today to minimize standing/LE weight bearing due to new onset of L foot pain today after walking with his dog w/o known MOI (has h/o stress fracture in R foot last year) - will see Dr. Marin Olp tomorrow and will make him aware. R shoulder muscle tension significantly improved with some residual tightness persisting in ant shoulder which continues to restrict end range flexion ROM.    Rehab Potential  Good    Clinical Impairments Affecting Rehab Potential  multiple myeloma s/p stem cell transplant 08/22/2016 at [facility] - currently controlled but not in remission; multiple vertebral compression fractures with kyphoplasties and vertbroloplasties    PT Treatment/Interventions  Patient/family education;Therapeutic exercise;Therapeutic activities;Neuromuscular re-education;Manual techniques;Passive range of motion;Dry needling;Taping;Electrical Stimulation;Moist Heat;Cryotherapy;Vasopneumatic Device;Iontophoresis 61m/ml Dexamethasone;ADLs/Self Care Home Management    Consulted and Agree with Plan of Care  Patient       Patient will benefit from skilled therapeutic intervention in order to improve the following deficits and impairments:  Pain, Postural dysfunction, Decreased range of motion, Decreased strength, Increased muscle spasms, Impaired flexibility, Impaired UE functional use, Decreased activity tolerance,  Abnormal gait  Visit Diagnosis: Acute pain of right shoulder  Stiffness  of right shoulder, not elsewhere classified  Abnormal posture  Muscle weakness (generalized)  Cramp and spasm     Problem List Patient Active Problem List   Diagnosis Date Noted  . Infection due to human metapneumovirus (hMPV) 02/04/2018  . Neutropenia with fever ([place]) 02/02/2018  . SOB (shortness of breath)   . Pancytopenia ([place]) 02/01/2018  . Bacteremia 11/10/2017  . Sepsis ([place]) 11/09/2017  . HCAP (healthcare-associated pneumonia) 11/09/2017  . Thrombocytopenia ([place]) 11/09/2017  . Iron deficiency anemia due to chronic blood loss   . Neutropenia ([place]) 03/02/2017  . Cellulitis 03/01/2017  . Phlebitis or thrombophlebitis of lower extremity 07/21/2016  . Visit for preventive health examination 05/16/2016  . Aphthous ulcer 05/16/2016  . Ureterolithiasis 05/16/2016  . Acute deep vein thrombosis (DVT) of femoral vein of left lower extremity ([place]) 03/21/2016  . Multiple myeloma ([place]) 02/22/2016  . Multiple myeloma not having achieved remission ([place]) 02/22/2016  . Bilateral inguinal hernia (BIH), s/p lap repair 12/16/2013 11/02/2013    Percival Spanish, PT, MPT 04/05/2018, 5:13 PM  [facility] [address]  [place], [place], [address] Phone: [phone]   Fax:  [phone]  Name: Jorge [name] MRN: [id number] Date of Birth: 10/13/1955

## 2018-04-06 ENCOUNTER — Other Ambulatory Visit: Payer: Self-pay

## 2018-04-06 ENCOUNTER — Encounter: Payer: Self-pay | Admitting: Hematology & Oncology

## 2018-04-06 ENCOUNTER — Other Ambulatory Visit: Payer: Self-pay | Admitting: *Deleted

## 2018-04-06 ENCOUNTER — Inpatient Hospital Stay: Payer: 59

## 2018-04-06 ENCOUNTER — Ambulatory Visit: Payer: 59

## 2018-04-06 ENCOUNTER — Ambulatory Visit (HOSPITAL_BASED_OUTPATIENT_CLINIC_OR_DEPARTMENT_OTHER)
Admission: RE | Admit: 2018-04-06 | Discharge: 2018-04-06 | Disposition: A | Discharge status: Home / Self Care | Payer: 59 | Source: Ambulatory Visit | Attending: Hematology & Oncology | Admitting: Hematology & Oncology

## 2018-04-06 ENCOUNTER — Inpatient Hospital Stay: Payer: 59 | Attending: Hematology & Oncology | Admitting: Hematology & Oncology

## 2018-04-06 VITALS — BP 119/82 | HR 77 | Temp 98.2°F | Resp 17 | Wt 176.1 lb

## 2018-04-06 DIAGNOSIS — M79672 Pain in left foot: Secondary | ICD-10-CM | POA: Diagnosis not present

## 2018-04-06 DIAGNOSIS — C9000 Multiple myeloma not having achieved remission: Secondary | ICD-10-CM | POA: Insufficient documentation

## 2018-04-06 DIAGNOSIS — Z9484 Stem cells transplant status: Secondary | ICD-10-CM

## 2018-04-06 DIAGNOSIS — Z79899 Other long term (current) drug therapy: Secondary | ICD-10-CM

## 2018-04-06 DIAGNOSIS — D509 Iron deficiency anemia, unspecified: Secondary | ICD-10-CM | POA: Diagnosis not present

## 2018-04-06 DIAGNOSIS — D500 Iron deficiency anemia secondary to blood loss (chronic): Secondary | ICD-10-CM

## 2018-04-06 LAB — CBC WITH DIFFERENTIAL (CANCER CENTER ONLY)
Basophils Absolute: 0 10*3/uL (ref 0.0–0.1)
Basophils Relative: 0 %
EOS PCT: 3 %
Eosinophils Absolute: 0.2 10*3/uL (ref 0.0–0.5)
HCT: 39.3 % (ref 38.7–49.9)
Hemoglobin: 13 g/dL (ref 13.0–17.1)
LYMPHS ABS: 1.3 10*3/uL (ref 0.9–3.3)
LYMPHS PCT: 22 %
MCH: 31.6 pg (ref 28.0–33.4)
MCHC: 33.1 g/dL (ref 32.0–35.9)
MCV: 95.4 fL (ref 82.0–98.0)
MONOS PCT: 13 %
Monocytes Absolute: 0.7 10*3/uL (ref 0.1–0.9)
Neutro Abs: 3.5 10*3/uL (ref 1.5–6.5)
Neutrophils Relative %: 62 %
PLATELETS: 158 10*3/uL (ref 145–400)
RBC: 4.12 MIL/uL — AB (ref 4.20–5.70)
RDW: 22.4 % — ABNORMAL HIGH (ref 11.1–15.7)
WBC: 5.7 10*3/uL (ref 4.0–10.0)

## 2018-04-06 LAB — CMP (CANCER CENTER ONLY)
ALT: 43 U/L (ref 10–47)
AST: 41 U/L — ABNORMAL HIGH (ref 11–38)
Albumin: 3.6 g/dL (ref 3.5–5.0)
Alkaline Phosphatase: 61 U/L (ref 26–84)
Anion gap: 1 — ABNORMAL LOW (ref 5–15)
BUN: 10 mg/dL (ref 7–22)
CO2: 27 mmol/L (ref 18–33)
CREATININE: 0.9 mg/dL (ref 0.60–1.20)
Calcium: 9.4 mg/dL (ref 8.0–10.3)
Chloride: 109 mmol/L — ABNORMAL HIGH (ref 98–108)
GLUCOSE: 101 mg/dL (ref 73–118)
POTASSIUM: 4.2 mmol/L (ref 3.3–4.7)
SODIUM: 137 mmol/L (ref 128–145)
TOTAL PROTEIN: 6.7 g/dL (ref 6.4–8.1)
Total Bilirubin: 0.6 mg/dL (ref 0.2–1.6)

## 2018-04-06 LAB — IRON AND TIBC
Iron: 59 ug/dL (ref 42–163)
SATURATION RATIOS: 15 % — AB (ref 42–163)
TIBC: 400 ug/dL (ref 202–409)
UIBC: 341 ug/dL

## 2018-04-06 LAB — FERRITIN: Ferritin: 30 ng/mL (ref 24–336)

## 2018-04-06 MED ORDER — LENALIDOMIDE 5 MG PO CAPS: 5.0000 mg | ORAL_CAPSULE | Freq: Every day | ORAL | 6 refills | Status: DC

## 2018-04-06 MED ORDER — LENALIDOMIDE 5 MG PO CAPS: ORAL_CAPSULE | ORAL | 0 refills | Status: DC

## 2018-04-06 NOTE — Progress Notes (Signed)
Hematology and Oncology Follow Up Visit  Jorge [name] [id number] 18-May-1956 62 y.o. 04/06/2018   Principle Diagnosis:  IgG Kappa myeloma - Hyperdiploid/+11 DVT of the LEFT and RIGHT leg  Iron deficiency anemia  Current Therapy:   Revlimid 33m po q day (21 on/7 off) - start on 04/07/2018   Zometa 4 mg IV q 3 months- next dose is 05/2018 Xarelto 10 mg PO daily IV Iron as needed   Interim History:  Mr. PTieuis here today with his wife for follow-up.  [place]  [place] showed me pictures.  He actually went to the [place] his [place]  I thought this was incredibly moving for him.  Thankfully he did not get sick.  He was able to do what he wanted to do.  He feels well.  We did give him iron before he left.  This really helped with his stamina.  He has had no problems with fever.  He has had no cough or shortness of breath.  He has had no nausea or vomiting.  We need to get him back onto Revlimid.  I think we should try him on 5 mg daily (21 days on/7 days off).  I think this would be reasonable.  His myeloma studies back in August did not show a M spike.  His IgG level was 880 mg/dL.  His Kappa light chain was 2.1 mg/dL.    He is complaining of pain on the top of his left foot.  I looked at this area.  There may be a little bit of swelling.  There is no erythema.  There is some point tenderness.  We will have to get a plain x-ray.  If this is positive for a stress fracture, he will have to go to orthopedic surgery.  If the x-ray is negative, we will just follow this along.  If the pain persists, he may need a bone scan.  Overall, his performance status is ECOG 0  Medications:  Allergies as of 04/06/2018   No Known Allergies     Medication List        Accurate as of 04/06/18  9:04 AM. Always use your most recent med list.          acetaminophen 325 MG tablet Commonly known as:  TYLENOL Take 2 tablets (650 mg total) by  mouth every 6 (six) hours as needed for mild pain (or Fever >/= 101).   albuterol 108 (90 Base) MCG/ACT inhaler Commonly known as:  PROVENTIL HFA;VENTOLIN HFA Inhale 2 puffs into the lungs every 6 (six) hours as needed for wheezing or shortness of breath.   amoxicillin-clavulanate 875-125 MG tablet Commonly known as:  AUGMENTIN Take 1 tablet by mouth 2 (two) times daily.   benzonatate 200 MG capsule Commonly known as:  TESSALON Take 1 capsule (200 mg total) by mouth 3 (three) times daily as needed for cough.   cetirizine 10 MG tablet Commonly known as:  ZYRTEC Take 10 mg by mouth daily as needed for allergies.   cholecalciferol 1000 units tablet Commonly known as:  VITAMIN D Take 1,000 Units by mouth daily.   famciclovir 500 MG tablet Commonly known as:  FAMVIR Take 1 tablet (500 mg total) by mouth daily.   fluticasone 50 MCG/ACT nasal spray Commonly known as:  FLONASE Place 2 sprays into both nostrils daily.   lidocaine 5 % Commonly known as:  LIDODERM PLAEC 1 PATCH ONTO THE  SKIN DAILY as needed for pain. APPLY 1 PATCH TO THE MOST PAINFUL AREA FOR 12 HR IN A 24 HR PERIOD   magnesium oxide 400 MG tablet Commonly known as:  MAG-OX Take 400 mg by mouth daily.   ondansetron 8 MG disintegrating tablet Commonly known as:  ZOFRAN-ODT Take 1 tablet (8 mg total) by mouth every 8 (eight) hours as needed.   OVER THE COUNTER MEDICATION Take 1 tablet by mouth daily.   polyethylene glycol powder powder Commonly known as:  GLYCOLAX/MIRALAX Take 17 g by mouth daily.   PROBIOTIC PO Take 1 capsule by mouth daily.   rOPINIRole 0.25 MG tablet Commonly known as:  REQUIP TAKE 1 TABLET BY MOUTH 1 TO 3 HOURS BEFORE BED FOR 2 DAYS THEN INCREASE TO 2 TABS   senna 8.6 MG Tabs tablet Commonly known as:  SENOKOT Take 2 tablets (17.2 mg total) by mouth daily.   sertraline 100 MG tablet Commonly known as:  ZOLOFT Take 100 mg by mouth at bedtime.   XARELTO 10 MG Tabs tablet Generic  drug:  rivaroxaban Take 1 tablet (10 mg total) by mouth daily.   zolpidem 10 MG tablet Commonly known as:  AMBIEN Take 1 tablet (10 mg total) by mouth at bedtime as needed.   ZOMETA IV Inject 4 mg into the vein every 3 (three) months. Receives at [name] office.       Allergies: No Known Allergies  Past Medical History, Surgical history, Social history, and Family History were reviewed and updated.  Review of Systems: Review of Systems  Constitutional: Negative.   HENT: Negative.   Eyes: Negative.   Respiratory: Negative.   Cardiovascular: Negative.   Gastrointestinal: Negative.   Genitourinary: Negative.   Musculoskeletal: Positive for myalgias.  Skin: Negative.   Neurological: Negative.   Endo/Heme/Allergies: Negative.   Psychiatric/Behavioral: Negative.      Physical Exam:  weight is 176 lb 1.9 oz (79.9 kg). His oral temperature is 98.2 F (36.8 C). His blood pressure is 119/82 and his pulse is 77. His respiration is 17 and oxygen saturation is 99%.   Wt Readings from Last 3 Encounters:  04/06/18 176 lb 1.9 oz (79.9 kg)  03/02/18 173 lb 2 oz (78.5 kg)  02/22/18 171 lb 12.8 oz (77.9 kg)    Physical Exam  Constitutional: He is oriented to person, place, and time.  HENT:  Head: Normocephalic and atraumatic.  Mouth/Throat: Oropharynx is clear and moist.  Eyes: Pupils are equal, round, and reactive to light. EOM are normal.  Neck: Normal range of motion.  Cardiovascular: Normal rate, regular rhythm and normal heart sounds.  Pulmonary/Chest: Effort normal and breath sounds normal.  Abdominal: Soft. Bowel sounds are normal.  Musculoskeletal: Normal range of motion. He exhibits no edema, tenderness or deformity.  Lymphadenopathy:    He has no cervical adenopathy.  Neurological: He is alert and oriented to person, place, and time.  Skin: Skin is warm and dry. No rash noted. No erythema.  Psychiatric: He has a normal mood and affect. His behavior is normal.  Judgment and thought content normal.  Vitals reviewed.    Lab Results  Component Value Date   WBC 5.7 04/06/2018   HGB 13.0 04/06/2018   HCT 39.3 04/06/2018   MCV 95.4 04/06/2018   PLT 158 04/06/2018   Lab Results  Component Value Date   FERRITIN 34 02/10/2018   IRON 22 (L) 02/10/2018   TIBC 495 (H) 02/10/2018   UIBC 473 02/10/2018  IRONPCTSAT 4 (L) 02/10/2018   Lab Results  Component Value Date   RETICCTPCT 1.1 03/05/2017   RBC 4.12 (L) 04/06/2018   Lab Results  Component Value Date   KPAFRELGTCHN 21.2 (H) 03/02/2018   LAMBDASER 8.9 03/02/2018   KAPLAMBRATIO 2.38 (H) 03/02/2018   Lab Results  Component Value Date   IGGSERUM 814 03/02/2018   IGGSERUM 880 03/02/2018   IGA 225 03/02/2018   IGA 246 03/02/2018   IGMSERUM 24 03/02/2018   IGMSERUM 27 03/02/2018   Lab Results  Component Value Date   TOTALPROTELP 6.3 03/02/2018   ALBUMINELP 3.9 03/02/2018   A1GS 0.2 03/02/2018   A2GS 0.5 03/02/2018   BETS 1.0 03/02/2018   GAMS 0.7 03/02/2018   MSPIKE Not Observed 03/02/2018   SPEI Comment 01/05/2018     Chemistry      Component Value Date/Time   NA 137 04/06/2018 0808   NA 143 06/30/2017 1049   NA 140 10/03/2016 0845   K 4.2 04/06/2018 0808   K 4.0 06/30/2017 1049   K 4.3 10/03/2016 0845   CL 109 (H) 04/06/2018 0808   CL 105 06/30/2017 1049   CO2 27 04/06/2018 0808   CO2 27 06/30/2017 1049   CO2 26 10/03/2016 0845   BUN 10 04/06/2018 0808   BUN 13 06/30/2017 1049   BUN 11.9 10/03/2016 0845   CREATININE 0.90 04/06/2018 0808   CREATININE 1.0 06/30/2017 1049   CREATININE 0.9 10/03/2016 0845      Component Value Date/Time   CALCIUM 9.4 04/06/2018 0808   CALCIUM 9.0 06/30/2017 1049   CALCIUM 9.9 10/03/2016 0845   ALKPHOS 61 04/06/2018 0808   ALKPHOS 57 06/30/2017 1049   ALKPHOS 80 10/03/2016 0845   AST 41 (H) 04/06/2018 0808   AST 28 10/03/2016 0845   ALT 43 04/06/2018 0808   ALT 43 06/30/2017 1049   ALT 26 10/03/2016 0845   BILITOT 0.6  04/06/2018 0808   BILITOT 0.48 10/03/2016 0845      Impression and Plan: Mr. Pelfrey is a pleasant 62 yo gentleman with IgG kappa myeloma.  He ultimately underwent a autologous stem cell transplant at [facility] in February 2018.  We got very fortunate in that he had a wonderful time on vacation in [place].  We will not get him back on the 5 mg of Revlimid.  I think this is reasonable.  I would like to see him back in 6 weeks.  At that time, we will also plan for his Zometa.   [name], MD 9/24/20199:04 AM

## 2018-04-07 ENCOUNTER — Telehealth: Payer: Self-pay | Admitting: *Deleted

## 2018-04-07 ENCOUNTER — Other Ambulatory Visit: Payer: Self-pay | Admitting: Hematology & Oncology

## 2018-04-07 DIAGNOSIS — M545 Low back pain, unspecified: Secondary | ICD-10-CM

## 2018-04-07 DIAGNOSIS — G8929 Other chronic pain: Secondary | ICD-10-CM

## 2018-04-07 DIAGNOSIS — D472 Monoclonal gammopathy: Secondary | ICD-10-CM

## 2018-04-07 DIAGNOSIS — C9000 Multiple myeloma not having achieved remission: Secondary | ICD-10-CM

## 2018-04-07 LAB — PROTEIN ELECTROPHORESIS, SERUM, WITH REFLEX
A/G Ratio: 1.3 (ref 0.7–1.7)
ALPHA-1-GLOBULIN: 0.3 g/dL (ref 0.0–0.4)
Albumin ELP: 3.5 g/dL (ref 2.9–4.4)
Alpha-2-Globulin: 0.7 g/dL (ref 0.4–1.0)
Beta Globulin: 0.9 g/dL (ref 0.7–1.3)
GAMMA GLOBULIN: 0.8 g/dL (ref 0.4–1.8)
GLOBULIN, TOTAL: 2.6 g/dL (ref 2.2–3.9)
TOTAL PROTEIN ELP: 6.1 g/dL (ref 6.0–8.5)

## 2018-04-07 LAB — KAPPA/LAMBDA LIGHT CHAINS
KAPPA FREE LGHT CHN: 23.8 mg/L — AB (ref 3.3–19.4)
Kappa, lambda light chain ratio: 2.2 — ABNORMAL HIGH (ref 0.26–1.65)
LAMDA FREE LIGHT CHAINS: 10.8 mg/L (ref 5.7–26.3)

## 2018-04-07 LAB — IGG, IGA, IGM
IGM (IMMUNOGLOBULIN M), SRM: 32 mg/dL (ref 20–172)
IgA: 135 mg/dL (ref 61–437)
IgG (Immunoglobin G), Serum: 801 mg/dL (ref 700–1600)

## 2018-04-07 NOTE — Telephone Encounter (Addendum)
Patient aware of results. Foot feels better today. Appointment made   ----- Message from [name], MD sent at 04/07/2018  6:00 AM EDT ----- Call - iron is better but still low.  Needs 1 more dose of feraheme.  pete  [name], MD  P Onc Nurse Hp        Call - foot xray looks ok. If it still bothers you, we will get a bone scan. pete

## 2018-04-08 ENCOUNTER — Ambulatory Visit: Payer: 59 | Admitting: Physical Therapy

## 2018-04-08 ENCOUNTER — Encounter: Payer: Self-pay | Admitting: Physical Therapy

## 2018-04-08 DIAGNOSIS — M25611 Stiffness of right shoulder, not elsewhere classified: Secondary | ICD-10-CM

## 2018-04-08 DIAGNOSIS — M6281 Muscle weakness (generalized): Secondary | ICD-10-CM

## 2018-04-08 DIAGNOSIS — R293 Abnormal posture: Secondary | ICD-10-CM

## 2018-04-08 DIAGNOSIS — M25511 Pain in right shoulder: Secondary | ICD-10-CM

## 2018-04-08 DIAGNOSIS — R252 Cramp and spasm: Secondary | ICD-10-CM

## 2018-04-08 NOTE — Therapy (Signed)
[facility] [address]  [place], [place], [address] Phone: [phone]   Fax:  [phone]  Physical Therapy Treatment  Patient Details  Name: Jorge [name] MRN: [id number] Date of Birth: 08/31/1955 Referring Provider (PT): Elsie Saas, MD   Encounter Date: 04/08/2018  PT End of Session - 04/08/18 1404    Visit Number  18    Number of Visits  20    Date for PT Re-Evaluation  04/16/18    Authorization Type  [employer]    Authorization - Number of Visits  60    PT Start Time  7579    PT Stop Time  1444    PT Time Calculation (min)  40 min    Activity Tolerance  Patient tolerated treatment well    Behavior During Therapy  [facility] for tasks assessed/performed       Past Medical History:  Diagnosis Date  . Anxiety   . Bone metastasis ([place])   . Chest cold 05/19/2016   productive cough  -- started on antibiotic  . Chronic back pain    due to bone mets from myeloma  . Cough   . Depression   . GERD (gastroesophageal reflux disease)   . Hiatal hernia   . History of chicken pox   . History of concussion    age 62 -- no residual  . History of DVT of lower extremity 03/21/2016  treated and completed w/ xarelto   per doppler left extensive occlusion common femoral, femoral, and popliteal veins and right partial occlusion common femoral and profunda femoral veins/  last doppler 06-04-2016 no evidence acute or chronic dvt noted either leg   . History of radiation therapy 06/09/16-06/23/16   lower thoracic spine 25 Gy in 10 fractions  . Mouth ulcers    secondary to radiation  . Multiple myeloma ([place]) dx 02/22/2016 via bone marrow bx---  oncologist-  [name]   IgG Kappa-- Hyperdiploid/ +11 w/ bone mets--  current treatment chemotherapy (started 08/ 2017)and pallitive radiation to back started 06-09-2016  . Renal calculus, right   . Wears contact lenses     Past Surgical History:  Procedure Laterality Date  . COLONOSCOPY   [id number]  . CYSTOSCOPY W/ URETERAL STENT PLACEMENT Right 06/20/2016   Procedure: CYSTOSCOPY WITH STENT REPLACEMENT;  Surgeon: Kathie Rhodes, MD;  Location: [facility];  Service: Urology;  Laterality: Right;  . CYSTOSCOPY WITH RETROGRADE PYELOGRAM, URETEROSCOPY AND STENT PLACEMENT Right 05/30/2016   Procedure: CYSTOSCOPY WITH RETROGRADE PYELOGRAM, URETEROSCOPY AND STENT PLACEMENT,DILITATION URETERAL STRICTURE;  Surgeon: Kathie Rhodes, MD;  Location: [facility];  Service: Urology;  Laterality: Right;  . CYSTOSCOPY/RETROGRADE/URETEROSCOPY/STONE EXTRACTION WITH BASKET Right 06/20/2016   Procedure: CYSTOSCOPY/URETEROSCOPY/STONE EXTRACTION WITH BASKET;  Surgeon: Kathie Rhodes, MD;  Location: [facility];  Service: Urology;  Laterality: Right;  . HOLMIUM LASER APPLICATION Right 72/02/2059   Procedure: HOLMIUM LASER APPLICATION;  Surgeon: Kathie Rhodes, MD;  Location: [facility];  Service: Urology;  Laterality: Right;  . IR GENERIC HISTORICAL  02/11/2016   IR RADIOLOGIST EVAL & MGMT 02/11/2016 [facility]  . IR GENERIC HISTORICAL  02/15/2016   IR BONE TUMOR(S)RF ABLATION 02/15/2016 Luanne Bras, MD [facility]  . IR GENERIC HISTORICAL  02/15/2016   IR BONE TUMOR(S)RF ABLATION 02/15/2016 Luanne Bras, MD [facility]  . IR GENERIC HISTORICAL  02/15/2016   IR BONE TUMOR(S)RF ABLATION 02/15/2016 Luanne Bras, MD [facility]  . IR GENERIC HISTORICAL  02/15/2016  IR KYPHO THORACIC WITH BONE BIOPSY 02/15/2016 Luanne Bras, MD [facility]  . IR GENERIC HISTORICAL  02/15/2016   IR KYPHO THORACIC WITH BONE BIOPSY 02/15/2016 Luanne Bras, MD [facility]  . IR GENERIC HISTORICAL  02/15/2016   IR VERTEBROPLASTY CERV/THOR BX INC UNI/BIL INC/INJECT/IMAGING 02/15/2016 Luanne Bras, MD [facility]  . IR GENERIC HISTORICAL  03/13/2016   IR KYPHO EA ADDL LEVEL THORACIC OR LUMBAR 03/13/2016 Luanne Bras, MD [facility]  . IR GENERIC HISTORICAL  03/13/2016    IR KYPHO EA ADDL LEVEL THORACIC OR LUMBAR 03/13/2016 Luanne Bras, MD [facility]  . IR GENERIC HISTORICAL  03/13/2016   IR BONE TUMOR(S)RF ABLATION 03/13/2016 Luanne Bras, MD [facility]  . IR GENERIC HISTORICAL  03/13/2016   IR KYPHO LUMBAR INC FX REDUCE BONE BX UNI/BIL CANNULATION INC/IMAGING 03/13/2016 Luanne Bras, MD [facility]  . IR GENERIC HISTORICAL  03/13/2016   IR BONE TUMOR(S)RF ABLATION 03/13/2016 Luanne Bras, MD [facility]  . IR GENERIC HISTORICAL  03/13/2016   IR BONE TUMOR(S)RF ABLATION 03/13/2016 Luanne Bras, MD [facility]  . IR GENERIC HISTORICAL  03/31/2016   IR RADIOLOGIST EVAL & MGMT 03/31/2016 [facility]  . LAPAROSCOPIC INGUINAL HERNIA REPAIR Bilateral 12-16-2013  dr gross  . RADIOLOGY WITH ANESTHESIA N/A 02/15/2016   Procedure: Spinal Ablation;  Surgeon: Luanne Bras, MD;  Location: [facility];  Service: Radiology;  Laterality: N/A;  . RADIOLOGY WITH ANESTHESIA N/A 03/13/2016   Procedure: LUMBER ABLATION;  Surgeon: Luanne Bras, MD;  Location: [facility];  Service: Radiology;  Laterality: N/A;  . ROTATOR CUFF REPAIR Right 2003  . TONSILLECTOMY  age 47  . WISDOM TOOTH EXTRACTION      There were no vitals filed for this visit.  Subjective Assessment - 04/08/18 1407    Subjective  Pt reports pain in foot reported on last visit became so severe that he ended having to use crtuches to walk, but after 36 hrs has completely resolved. As for shoulder, "no pain, just a lot of tightness".    Pertinent History  multiple myeloma s/p stem cell transplant 08/22/2016 at [facility] - currently controlled but not in remission - follows up regularly with Dr. Marin Olp; multiple vertebral compression fractures with kyphoplasties and vertbroloplasties    Diagnostic tests  X-ray completed in MD office revealed some OA in R shoulder    Patient Stated Goals  "to have more movement in my arm, get rid of most of the pain, and start strengthening my shoulder"    Currently  in Pain?  No/denies                       [facility] Adult PT Treatment/Exercise - 04/08/18 1404      Exercises   Exercises  Shoulder      Shoulder Exercises: ROM/Strengthening   UBE (Upper Arm Bike)  L3.0 x 6 min (3' fwd/3' back)      Shoulder Exercises: Stretch   Other Shoulder Stretches  R anterior deltoid stretch holding onto door frame x30 sec       Trigger Point Dry Needling - 04/08/18 1404    Consent Given?  Yes    Muscles Treated Upper Body  --   R anterior & post/lat deltoid: + twitch response            PT Short Term Goals - 12/24/17 1147      PT SHORT TERM GOAL #1   Title  independent with initial HEP  Status  Achieved        PT Long Term Goals - 03/31/18 0853      PT LONG TERM GOAL #1   Title  Independent with ongoing HEP +/- gym program     Status  Achieved      PT LONG TERM GOAL #2   Title  R shoulder AROM equivalent to L shoulder w/o pain provocation     Status  Partially Met      PT LONG TERM GOAL #3   Title  R shoulder strength >/= 4+/5 w/o pain on resistance    Status  Achieved      PT LONG TERM GOAL #4   Title  Patient to report ability to perform daily activities including ADLs, reaching and light lifting with R arm w/o pain provocation    Status  Partially Met      PT LONG TERM GOAL #5   Title  Patient to return to working out w/o limitation due to R shoulder pain    Status  Partially Met            Plan - 04/08/18 1410    Clinical Impression Statement  Jorge [name] remains pain free but continues to note increased muscle tension/tightness limiting movement of shoulder, most pronouned in front of shoulder. Pecs and teres group remain tight but no isolated TPs/taut bands identified today, however mutiple band present in anterior & posterior/lateral deltoid. Addressed with manual therapy including DN with improved motion following this, however pt still feeling restriction in area of anterior deltoid, therefore provided  instruction for stretch for this.    Rehab Potential  Good    Clinical Impairments Affecting Rehab Potential  multiple myeloma s/p stem cell transplant 08/22/2016 at [facility] - currently controlled but not in remission; multiple vertebral compression fractures with kyphoplasties and vertbroloplasties    PT Treatment/Interventions  Patient/family education;Therapeutic exercise;Therapeutic activities;Neuromuscular re-education;Manual techniques;Passive range of motion;Dry needling;Taping;Electrical Stimulation;Moist Heat;Cryotherapy;Vasopneumatic Device;Iontophoresis 4m/ml Dexamethasone;ADLs/Self Care Home Management    Consulted and Agree with Plan of Care  Patient       Patient will benefit from skilled therapeutic intervention in order to improve the following deficits and impairments:  Pain, Postural dysfunction, Decreased range of motion, Decreased strength, Increased muscle spasms, Impaired flexibility, Impaired UE functional use, Decreased activity tolerance, Abnormal gait  Visit Diagnosis: Acute pain of right shoulder  Stiffness of right shoulder, not elsewhere classified  Abnormal posture  Muscle weakness (generalized)  Cramp and spasm     Problem List Patient Active Problem List   Diagnosis Date Noted  . Infection due to human metapneumovirus (hMPV) 02/04/2018  . Neutropenia with fever ([place] 02/02/2018  . SOB (shortness of breath)   . Pancytopenia ([place] 02/01/2018  . Bacteremia 11/10/2017  . Sepsis ([place] 11/09/2017  . HCAP (healthcare-associated pneumonia) 11/09/2017  . Thrombocytopenia ([place] 11/09/2017  . Iron deficiency anemia due to chronic blood loss   . Neutropenia ([place] 03/02/2017  . Cellulitis 03/01/2017  . Phlebitis or thrombophlebitis of lower extremity 07/21/2016  . Visit for preventive health examination 05/16/2016  . Aphthous ulcer 05/16/2016  . Ureterolithiasis 05/16/2016  . Acute deep vein thrombosis (DVT) of femoral vein of left lower extremity ([place]  03/21/2016  . Multiple myeloma ([place] 02/22/2016  . Multiple myeloma not having achieved remission ([place] 02/22/2016  . Bilateral inguinal hernia (BIH), s/p lap repair 12/16/2013 11/02/2013    JPercival Spanish PT, MPT 04/08/2018, 2:58 PM  [facility] [address]  [place], [place], [address] Phone: [phone]   Fax:  [phone]  Name: Jorge [name] MRN: [id number] Date of Birth: 1955-11-03

## 2018-04-09 ENCOUNTER — Inpatient Hospital Stay: Payer: 59

## 2018-04-09 VITALS — BP 119/77 | HR 75 | Temp 98.2°F | Resp 20

## 2018-04-09 DIAGNOSIS — C9000 Multiple myeloma not having achieved remission: Secondary | ICD-10-CM | POA: Diagnosis not present

## 2018-04-09 MED ORDER — SODIUM CHLORIDE 0.9 % IV SOLN
INTRAVENOUS | Status: DC
  Administered 2018-04-09: 13:00:00 via INTRAVENOUS
  Filled 2018-04-09: qty 250

## 2018-04-09 MED ORDER — SODIUM CHLORIDE 0.9 % IV SOLN
510.0000 mg | Freq: Once | INTRAVENOUS | Status: AC
  Administered 2018-04-09: 510 mg via INTRAVENOUS
  Filled 2018-04-09: qty 17

## 2018-04-09 NOTE — Progress Notes (Signed)
Pt refused to stay for 30 minute post observation after Feraheme.  Pt without complaints at time of discharge.  

## 2018-04-19 ENCOUNTER — Ambulatory Visit: Payer: 59 | Attending: Orthopedic Surgery | Admitting: Physical Therapy

## 2018-04-19 ENCOUNTER — Encounter: Payer: Self-pay | Admitting: Physical Therapy

## 2018-04-19 ENCOUNTER — Ambulatory Visit (INDEPENDENT_AMBULATORY_CARE_PROVIDER_SITE_OTHER): Payer: 59 | Admitting: Sports Medicine

## 2018-04-19 VITALS — BP 122/88 | Ht 67.0 in | Wt 175.0 lb

## 2018-04-19 DIAGNOSIS — R293 Abnormal posture: Secondary | ICD-10-CM | POA: Insufficient documentation

## 2018-04-19 DIAGNOSIS — M6281 Muscle weakness (generalized): Secondary | ICD-10-CM | POA: Diagnosis present

## 2018-04-19 DIAGNOSIS — R252 Cramp and spasm: Secondary | ICD-10-CM

## 2018-04-19 DIAGNOSIS — M216X1 Other acquired deformities of right foot: Secondary | ICD-10-CM

## 2018-04-19 DIAGNOSIS — M25611 Stiffness of right shoulder, not elsewhere classified: Secondary | ICD-10-CM | POA: Diagnosis not present

## 2018-04-19 DIAGNOSIS — M25511 Pain in right shoulder: Secondary | ICD-10-CM | POA: Diagnosis present

## 2018-04-19 DIAGNOSIS — M216X2 Other acquired deformities of left foot: Secondary | ICD-10-CM

## 2018-04-19 DIAGNOSIS — M7742 Metatarsalgia, left foot: Secondary | ICD-10-CM | POA: Diagnosis not present

## 2018-04-19 NOTE — Therapy (Signed)
[facility] [address]  [place], [place], [address] Phone: [phone]   Fax:  [phone]  Physical Therapy Treatment  Patient Details  Name: Jorge [name] MRN: [id number] Date of Birth: 03-19-1956 Referring Provider (PT): Elsie Saas, MD   Encounter Date: 04/19/2018  PT End of Session - 04/19/18 1540    Visit Number  19    Number of Visits  20    Date for PT Re-Evaluation  04/16/18    Authorization Type  [employer]    Authorization - Number of Visits  60    PT Start Time  1540    PT Stop Time  1624    PT Time Calculation (min)  44 min    Activity Tolerance  Patient tolerated treatment well    Behavior During Therapy  [facility] for tasks assessed/performed       Past Medical History:  Diagnosis Date  . Anxiety   . Bone metastasis ([place])   . Chest cold 05/19/2016   productive cough  -- started on antibiotic  . Chronic back pain    due to bone mets from myeloma  . Cough   . Depression   . GERD (gastroesophageal reflux disease)   . Hiatal hernia   . History of chicken pox   . History of concussion    age 62 -- no residual  . History of DVT of lower extremity 03/21/2016  treated and completed w/ xarelto   per doppler left extensive occlusion common femoral, femoral, and popliteal veins and right partial occlusion common femoral and profunda femoral veins/  last doppler 06-04-2016 no evidence acute or chronic dvt noted either leg   . History of radiation therapy 06/09/16-06/23/16   lower thoracic spine 25 Gy in 10 fractions  . Mouth ulcers    secondary to radiation  . Multiple myeloma ([place]) dx 02/22/2016 via bone marrow bx---  oncologist-  [name]   IgG Kappa-- Hyperdiploid/ +11 w/ bone mets--  current treatment chemotherapy (started 08/ 2017)and pallitive radiation to back started 06-09-2016  . Renal calculus, right   . Wears contact lenses     Past Surgical History:  Procedure Laterality Date  . COLONOSCOPY   [id number]  . CYSTOSCOPY W/ URETERAL STENT PLACEMENT Right 06/20/2016   Procedure: CYSTOSCOPY WITH STENT REPLACEMENT;  Surgeon: Kathie Rhodes, MD;  Location: [facility];  Service: Urology;  Laterality: Right;  . CYSTOSCOPY WITH RETROGRADE PYELOGRAM, URETEROSCOPY AND STENT PLACEMENT Right 05/30/2016   Procedure: CYSTOSCOPY WITH RETROGRADE PYELOGRAM, URETEROSCOPY AND STENT PLACEMENT,DILITATION URETERAL STRICTURE;  Surgeon: Kathie Rhodes, MD;  Location: [facility];  Service: Urology;  Laterality: Right;  . CYSTOSCOPY/RETROGRADE/URETEROSCOPY/STONE EXTRACTION WITH BASKET Right 06/20/2016   Procedure: CYSTOSCOPY/URETEROSCOPY/STONE EXTRACTION WITH BASKET;  Surgeon: Kathie Rhodes, MD;  Location: [facility];  Service: Urology;  Laterality: Right;  . HOLMIUM LASER APPLICATION Right 32/07/2246   Procedure: HOLMIUM LASER APPLICATION;  Surgeon: Kathie Rhodes, MD;  Location: [facility];  Service: Urology;  Laterality: Right;  . IR GENERIC HISTORICAL  02/11/2016   IR RADIOLOGIST EVAL & MGMT 02/11/2016 [facility]  . IR GENERIC HISTORICAL  02/15/2016   IR BONE TUMOR(S)RF ABLATION 02/15/2016 Luanne Bras, MD [facility]  . IR GENERIC HISTORICAL  02/15/2016   IR BONE TUMOR(S)RF ABLATION 02/15/2016 Luanne Bras, MD [facility]  . IR GENERIC HISTORICAL  02/15/2016   IR BONE TUMOR(S)RF ABLATION 02/15/2016 Luanne Bras, MD [facility]  . IR GENERIC HISTORICAL  02/15/2016  IR KYPHO THORACIC WITH BONE BIOPSY 02/15/2016 Luanne Bras, MD [facility]  . IR GENERIC HISTORICAL  02/15/2016   IR KYPHO THORACIC WITH BONE BIOPSY 02/15/2016 Luanne Bras, MD [facility]  . IR GENERIC HISTORICAL  02/15/2016   IR VERTEBROPLASTY CERV/THOR BX INC UNI/BIL INC/INJECT/IMAGING 02/15/2016 Luanne Bras, MD [facility]  . IR GENERIC HISTORICAL  03/13/2016   IR KYPHO EA ADDL LEVEL THORACIC OR LUMBAR 03/13/2016 Luanne Bras, MD [facility]  . IR GENERIC HISTORICAL  03/13/2016    IR KYPHO EA ADDL LEVEL THORACIC OR LUMBAR 03/13/2016 Luanne Bras, MD [facility]  . IR GENERIC HISTORICAL  03/13/2016   IR BONE TUMOR(S)RF ABLATION 03/13/2016 Luanne Bras, MD [facility]  . IR GENERIC HISTORICAL  03/13/2016   IR KYPHO LUMBAR INC FX REDUCE BONE BX UNI/BIL CANNULATION INC/IMAGING 03/13/2016 Luanne Bras, MD [facility]  . IR GENERIC HISTORICAL  03/13/2016   IR BONE TUMOR(S)RF ABLATION 03/13/2016 Luanne Bras, MD [facility]  . IR GENERIC HISTORICAL  03/13/2016   IR BONE TUMOR(S)RF ABLATION 03/13/2016 Luanne Bras, MD [facility]  . IR GENERIC HISTORICAL  03/31/2016   IR RADIOLOGIST EVAL & MGMT 03/31/2016 [facility]  . LAPAROSCOPIC INGUINAL HERNIA REPAIR Bilateral 12-16-2013  dr gross  . RADIOLOGY WITH ANESTHESIA N/A 02/15/2016   Procedure: Spinal Ablation;  Surgeon: Luanne Bras, MD;  Location: [facility];  Service: Radiology;  Laterality: N/A;  . RADIOLOGY WITH ANESTHESIA N/A 03/13/2016   Procedure: LUMBER ABLATION;  Surgeon: Luanne Bras, MD;  Location: [facility];  Service: Radiology;  Laterality: N/A;  . ROTATOR CUFF REPAIR Right 2003  . TONSILLECTOMY  age 62  . WISDOM TOOTH EXTRACTION      There were no vitals filed for this visit.  Subjective Assessment - 04/19/18 1544    Subjective  Pt reports shoulder better overall - still gets tight sometimes but usually works itself out.  Has found he frequently uses anterior deltoid stretch as this is still the area prone to tightening up.    Pertinent History  multiple myeloma s/p stem cell transplant 08/22/2016 at [facility] - currently controlled but not in remission - follows up regularly with Dr. Marin Olp; multiple vertebral compression fractures with kyphoplasties and vertbroloplasties    Diagnostic tests  X-ray completed in MD office revealed some OA in R shoulder    Patient Stated Goals  "to have more movement in my arm, get rid of most of the pain, and start strengthening my shoulder"    Currently in  Pain?  No/denies                       [facility] Adult PT Treatment/Exercise - 04/19/18 1540      Exercises   Exercises  Shoulder      Shoulder Exercises: ROM/Strengthening   UBE (Upper Arm Bike)  L3.0 x 6 min (3' fwd/3' back)      Manual Therapy   Manual Therapy  Soft tissue mobilization;Myofascial release    Manual therapy comments  supine    Soft tissue mobilization  STM & DTM to R pecs, ant & middle deltoid, biceps, UT/LS & posterior shoulder complex    Myofascial Release  manual TPR to R anterior & middle deltoid, and teres group/lats       Trigger Point Dry Needling - 04/19/18 1540    Consent Given?  Yes    Muscles Treated Upper Body  Upper trapezius;Levator scapulae   + anterior & middle deltoid, biceps on R  Upper Trapezius Response  Twitch reponse elicited;Palpable increased muscle length    Levator Scapulae Response  Twitch response elicited;Palpable increased muscle length             PT Short Term Goals - 12/24/17 1147      PT SHORT TERM GOAL #1   Title  independent with initial HEP    Status  Achieved        PT Long Term Goals - 03/31/18 0853      PT LONG TERM GOAL #1   Title  Independent with ongoing HEP +/- gym program     Status  Achieved      PT LONG TERM GOAL #2   Title  R shoulder AROM equivalent to L shoulder w/o pain provocation     Status  Partially Met      PT LONG TERM GOAL #3   Title  R shoulder strength >/= 4+/5 w/o pain on resistance    Status  Achieved      PT LONG TERM GOAL #4   Title  Patient to report ability to perform daily activities including ADLs, reaching and light lifting with R arm w/o pain provocation    Status  Partially Met      PT LONG TERM GOAL #5   Title  Patient to return to working out w/o limitation due to R shoulder pain    Status  Partially Met            Plan - 04/19/18 1546    Clinical Impression Statement  Jorge [name] reporting R shoulder pain remains pretty well controlled recently,  noting more issues with ongoing intermittent tightness/stiffness most commonly in anterior shoulder and upper arm. Reports he feels confident with HEP stretches and exercises, but still feels need for ongoing treatment for increased muscle tension, therefore session focusing on manual therapy incorporating DN intermittently - pt noting excellent benefit from this with significant redcution on in tightness/stiffness by end of session.    Rehab Potential  Good    Clinical Impairments Affecting Rehab Potential  multiple myeloma s/p stem cell transplant 08/22/2016 at [facility] - currently controlled but not in remission; multiple vertebral compression fractures with kyphoplasties and vertbroloplasties    PT Treatment/Interventions  Patient/family education;Therapeutic exercise;Therapeutic activities;Neuromuscular re-education;Manual techniques;Passive range of motion;Dry needling;Taping;Electrical Stimulation;Moist Heat;Cryotherapy;Vasopneumatic Device;Iontophoresis 80m/ml Dexamethasone;ADLs/Self Care Home Management    Consulted and Agree with Plan of Care  Patient       Patient will benefit from skilled therapeutic intervention in order to improve the following deficits and impairments:  Pain, Postural dysfunction, Decreased range of motion, Decreased strength, Increased muscle spasms, Impaired flexibility, Impaired UE functional use, Decreased activity tolerance, Abnormal gait  Visit Diagnosis: Acute pain of right shoulder  Stiffness of right shoulder, not elsewhere classified  Abnormal posture  Muscle weakness (generalized)  Cramp and spasm     Problem List Patient Active Problem List   Diagnosis Date Noted  . Infection due to human metapneumovirus (hMPV) 02/04/2018  . Neutropenia with fever ([place] 02/02/2018  . SOB (shortness of breath)   . Pancytopenia ([place] 02/01/2018  . Bacteremia 11/10/2017  . Sepsis ([place] 11/09/2017  . HCAP (healthcare-associated pneumonia) 11/09/2017  .  Thrombocytopenia ([place] 11/09/2017  . Iron deficiency anemia due to chronic blood loss   . Neutropenia ([place] 03/02/2017  . Cellulitis 03/01/2017  . Phlebitis or thrombophlebitis of lower extremity 07/21/2016  . Visit for preventive health examination 05/16/2016  . Aphthous ulcer 05/16/2016  . Ureterolithiasis 05/16/2016  . Acute  deep vein thrombosis (DVT) of femoral vein of left lower extremity ([place]) 03/21/2016  . Multiple myeloma ([place]) 02/22/2016  . Multiple myeloma not having achieved remission ([place]) 02/22/2016  . Bilateral inguinal hernia (BIH), s/p lap repair 12/16/2013 11/02/2013    Percival Spanish, PT, MPT 04/19/2018, 6:28 PM  [facility] [address]  [place], [place], [address] Phone: [phone]   Fax:  [phone]  Name: Jorge [name] MRN: [id number] Date of Birth: 15-Dec-1955

## 2018-04-20 ENCOUNTER — Inpatient Hospital Stay: Payer: 59 | Attending: Hematology & Oncology

## 2018-04-20 ENCOUNTER — Encounter: Payer: Self-pay | Admitting: Sports Medicine

## 2018-04-20 DIAGNOSIS — C9000 Multiple myeloma not having achieved remission: Secondary | ICD-10-CM

## 2018-04-20 DIAGNOSIS — D509 Iron deficiency anemia, unspecified: Secondary | ICD-10-CM | POA: Insufficient documentation

## 2018-04-20 DIAGNOSIS — Z23 Encounter for immunization: Secondary | ICD-10-CM | POA: Insufficient documentation

## 2018-04-20 MED ORDER — INFLUENZA VAC SPLIT QUAD 0.5 ML IM SUSY
PREFILLED_SYRINGE | INTRAMUSCULAR | Status: AC
  Filled 2018-04-20: qty 0.5

## 2018-04-20 MED ORDER — INFLUENZA VAC SPLIT QUAD 0.5 ML IM SUSY
0.5000 mL | PREFILLED_SYRINGE | Freq: Once | INTRAMUSCULAR | Status: AC
  Administered 2018-04-20: 0.5 mL via INTRAMUSCULAR

## 2018-04-20 NOTE — Patient Instructions (Signed)

## 2018-04-20 NOTE — Progress Notes (Signed)
   Subjective:    Patient ID: Jorge [name], male    DOB: 1956-01-29, 62 y.o.   MRN: [id number]  HPI chief complaint: "I would like new orthotics"  Jorge [name] comes in today requesting new custom orthotics.  His current orthotics are 39-year-old.  Only complaint is some dorsal right foot pain and swelling that he had about 2 weeks ago which came on rather suddenly and resolve spontaneously.  He has been able to walk several miles at a time without any returning pain.  Today he is without complaint.  Interim medical history reviewed.  It is pertinent for multiple myeloma Medications reviewed Allergies reviewed    Review of Systems As above    Objective:   Physical Exam  Well-developed, well-nourished.  No acute distress.  Alert and oriented x3.  Vital signs reviewed  Cavus foot.  No obvious swelling.  Transverse arch collapse.  Walking without a limp.      Assessment & Plan:  Cavus foot Multiple myeloma  New custom orthotics were created for this patient today.  He found them to be comfortable prior to leaving the office.  Gait was well neutralized with orthotics in place.  Total time spent with the patient was 30 minutes with greater than 50% of the time spent in face-to-face consultation discussing orthotic construction, instruction, and fitting.  Patient will return to the office as needed.  Patient was fitted for a : standard, cushioned, semi-rigid orthotic. The orthotic was heated and afterward the patient stood on the orthotic blank positioned on the orthotic stand. The patient was positioned in subtalar neutral position and 10 degrees of ankle dorsiflexion in a weight bearing stance. After completion of molding, a stable base was applied to the orthotic blank. The blank was ground to a stable position for weight bearing. Size: 10 Base: Blue EVA Posting: none Additional orthotic padding: MT pad on the left

## 2018-04-21 ENCOUNTER — Encounter: Payer: Self-pay | Admitting: Physical Therapy

## 2018-04-21 ENCOUNTER — Ambulatory Visit: Payer: 59 | Admitting: Physical Therapy

## 2018-04-21 DIAGNOSIS — R252 Cramp and spasm: Secondary | ICD-10-CM

## 2018-04-21 DIAGNOSIS — M25511 Pain in right shoulder: Secondary | ICD-10-CM | POA: Diagnosis not present

## 2018-04-21 DIAGNOSIS — M25611 Stiffness of right shoulder, not elsewhere classified: Secondary | ICD-10-CM

## 2018-04-21 DIAGNOSIS — R293 Abnormal posture: Secondary | ICD-10-CM

## 2018-04-21 DIAGNOSIS — M6281 Muscle weakness (generalized): Secondary | ICD-10-CM

## 2018-04-21 NOTE — Therapy (Signed)
[facility] [address]  [place], [place], [address] Phone: [phone]   Fax:  [phone]  Physical Therapy Treatment  Patient Details  Name: Jorge [name] MRN: [id number] Date of Birth: [birth year] Referring Provider (PT): Elsie Saas, MD   Encounter Date: 04/21/2018  PT End of Session - 04/21/18 1101    Visit Number  20    Number of Visits  20    Date for PT Re-Evaluation  04/16/18    Authorization Type  [employer]    Authorization - Number of Visits  60    PT Start Time  1101    PT Stop Time  1143    PT Time Calculation (min)  42 min    Activity Tolerance  Patient tolerated treatment well    Behavior During Therapy  [facility] for tasks assessed/performed       Past Medical History:  Diagnosis Date  . Anxiety   . Bone metastasis ([place])   . Chest cold 05/19/2016   productive cough  -- started on antibiotic  . Chronic back pain    due to bone mets from myeloma  . Cough   . Depression   . GERD (gastroesophageal reflux disease)   . Hiatal hernia   . History of chicken pox   . History of concussion    age 62 -- no residual  . History of DVT of lower extremity 03/21/2016  treated and completed w/ xarelto   per doppler left extensive occlusion common femoral, femoral, and popliteal veins and right partial occlusion common femoral and profunda femoral veins/  last doppler 06-04-2016 no evidence acute or chronic dvt noted either leg   . History of radiation therapy 06/09/16-06/23/16   lower thoracic spine 25 Gy in 10 fractions  . Mouth ulcers    secondary to radiation  . Multiple myeloma ([place]) dx 02/22/2016 via bone marrow bx---  oncologist-  [name]   IgG Kappa-- Hyperdiploid/ +11 w/ bone mets--  current treatment chemotherapy (started 08/ 2017)and pallitive radiation to back started 06-09-2016  . Renal calculus, right   . Wears contact lenses     Past Surgical History:  Procedure Laterality Date  . COLONOSCOPY   [id number]  . CYSTOSCOPY W/ URETERAL STENT PLACEMENT Right 06/20/2016   Procedure: CYSTOSCOPY WITH STENT REPLACEMENT;  Surgeon: Kathie Rhodes, MD;  Location: [facility];  Service: Urology;  Laterality: Right;  . CYSTOSCOPY WITH RETROGRADE PYELOGRAM, URETEROSCOPY AND STENT PLACEMENT Right 05/30/2016   Procedure: CYSTOSCOPY WITH RETROGRADE PYELOGRAM, URETEROSCOPY AND STENT PLACEMENT,DILITATION URETERAL STRICTURE;  Surgeon: Kathie Rhodes, MD;  Location: [facility];  Service: Urology;  Laterality: Right;  . CYSTOSCOPY/RETROGRADE/URETEROSCOPY/STONE EXTRACTION WITH BASKET Right 06/20/2016   Procedure: CYSTOSCOPY/URETEROSCOPY/STONE EXTRACTION WITH BASKET;  Surgeon: Kathie Rhodes, MD;  Location: [facility];  Service: Urology;  Laterality: Right;  . HOLMIUM LASER APPLICATION Right 83/07/5174   Procedure: HOLMIUM LASER APPLICATION;  Surgeon: Kathie Rhodes, MD;  Location: [facility];  Service: Urology;  Laterality: Right;  . IR GENERIC HISTORICAL  02/11/2016   IR RADIOLOGIST EVAL & MGMT 02/11/2016 [facility]  . IR GENERIC HISTORICAL  02/15/2016   IR BONE TUMOR(S)RF ABLATION 02/15/2016 Luanne Bras, MD [facility]  . IR GENERIC HISTORICAL  02/15/2016   IR BONE TUMOR(S)RF ABLATION 02/15/2016 Luanne Bras, MD [facility]  . IR GENERIC HISTORICAL  02/15/2016   IR BONE TUMOR(S)RF ABLATION 02/15/2016 Luanne Bras, MD [facility]  . IR GENERIC HISTORICAL  02/15/2016  IR KYPHO THORACIC WITH BONE BIOPSY 02/15/2016 Luanne Bras, MD [facility]  . IR GENERIC HISTORICAL  02/15/2016   IR KYPHO THORACIC WITH BONE BIOPSY 02/15/2016 Luanne Bras, MD [facility]  . IR GENERIC HISTORICAL  02/15/2016   IR VERTEBROPLASTY CERV/THOR BX INC UNI/BIL INC/INJECT/IMAGING 02/15/2016 Luanne Bras, MD [facility]  . IR GENERIC HISTORICAL  03/13/2016   IR KYPHO EA ADDL LEVEL THORACIC OR LUMBAR 03/13/2016 Luanne Bras, MD [facility]  . IR GENERIC HISTORICAL  03/13/2016    IR KYPHO EA ADDL LEVEL THORACIC OR LUMBAR 03/13/2016 Luanne Bras, MD [facility]  . IR GENERIC HISTORICAL  03/13/2016   IR BONE TUMOR(S)RF ABLATION 03/13/2016 Luanne Bras, MD [facility]  . IR GENERIC HISTORICAL  03/13/2016   IR KYPHO LUMBAR INC FX REDUCE BONE BX UNI/BIL CANNULATION INC/IMAGING 03/13/2016 Luanne Bras, MD [facility]  . IR GENERIC HISTORICAL  03/13/2016   IR BONE TUMOR(S)RF ABLATION 03/13/2016 Luanne Bras, MD [facility]  . IR GENERIC HISTORICAL  03/13/2016   IR BONE TUMOR(S)RF ABLATION 03/13/2016 Luanne Bras, MD [facility]  . IR GENERIC HISTORICAL  03/31/2016   IR RADIOLOGIST EVAL & MGMT 03/31/2016 [facility]  . LAPAROSCOPIC INGUINAL HERNIA REPAIR Bilateral 12-16-2013  dr gross  . RADIOLOGY WITH ANESTHESIA N/A 02/15/2016   Procedure: Spinal Ablation;  Surgeon: Luanne Bras, MD;  Location: [facility];  Service: Radiology;  Laterality: N/A;  . RADIOLOGY WITH ANESTHESIA N/A 03/13/2016   Procedure: LUMBER ABLATION;  Surgeon: Luanne Bras, MD;  Location: [facility];  Service: Radiology;  Laterality: N/A;  . ROTATOR CUFF REPAIR Right 2003  . TONSILLECTOMY  age 62  . WISDOM TOOTH EXTRACTION      There were no vitals filed for this visit.  Subjective Assessment - 04/21/18 1103    Subjective  Pt feeling comfortable with planned transition to HEP today. Notes "great" relief from manual + DN after last visit, but a little sore in the biceps area today.    Pertinent History  multiple myeloma s/p stem cell transplant 08/22/2016 at [facility] - currently controlled but not in remission - follows up regularly with Dr. Marin Olp; multiple vertebral compression fractures with kyphoplasties and vertbroloplasties    Diagnostic tests  X-ray completed in MD office revealed some OA in R shoulder    Patient Stated Goals  "to have more movement in my arm, get rid of most of the pain, and start strengthening my shoulder"    Currently in Pain?  Yes    Pain Score  1     Pain  Location  Arm    Pain Orientation  Right;Upper    Pain Descriptors / Indicators  Sore         [facility] PT Assessment - 04/21/18 1101      Assessment   Medical Diagnosis  R shoulder OA & RTC tendonitis    Referring Provider (PT)  Elsie Saas, MD    Onset Date/Surgical Date  --   Feb 2019   Next MD Visit  PRN    Prior Therapy  PT for deconditioning d/t multiple myeloma in 2018      AROM   Right Shoulder Flexion  144 Degrees    Right Shoulder ABduction  141 Degrees    Right Shoulder Internal Rotation  79 Degrees    Right Shoulder External Rotation  75 Degrees      Strength   Right Shoulder Flexion  5/5    Right Shoulder ABduction  5/5    Right Shoulder  Internal Rotation  5/5    Right Shoulder External Rotation  5/5                   [facility] Adult PT Treatment/Exercise - 04/21/18 1101      Exercises   Exercises  Shoulder      Shoulder Exercises: ROM/Strengthening   UBE (Upper Arm Bike)  L3.5 x 6 min (3' fwd/3' back)      Shoulder Exercises: Stretch   Other Shoulder Stretches  R anterior deltoid stretch holding onto door frame x30 sec    Other Shoulder Stretches  R biceps stretch with shoulder extended & hand stabilized on countertop x30 sec      Manual Therapy   Manual Therapy  Soft tissue mobilization;Myofascial release    Soft tissue mobilization  STM & DTM to R pecs, ant & middle deltoid, biceps    Myofascial Release  manual TPR to R anterior & middle deltoid       Trigger Point Dry Needling - 04/21/18 1101    Consent Given?  Yes    Muscles Treated Upper Body  --   R ant & lat deltoid, R biceps: + twitch response            PT Short Term Goals - 12/24/17 1147      PT SHORT TERM GOAL #1   Title  independent with initial HEP    Status  Achieved        PT Long Term Goals - 04/21/18 1117      PT LONG TERM GOAL #1   Title  Independent with ongoing HEP +/- gym program     Status  Achieved      PT LONG TERM GOAL #2   Title  R shoulder  AROM equivalent to L shoulder w/o pain provocation     Status  Achieved      PT LONG TERM GOAL #3   Title  R shoulder strength >/= 4+/5 w/o pain on resistance    Status  Achieved      PT LONG TERM GOAL #4   Title  Patient to report ability to perform daily activities including ADLs, reaching and light lifting with R arm w/o pain provocation    Status  Achieved      PT LONG TERM GOAL #5   Title  Patient to return to working out w/o limitation due to R shoulder pain    Status  Achieved            Plan - 04/21/18 1105    Clinical Impression Statement  Jorge [name] reporting 95% improvement in functional use of R arm since start of PT - still has occasional pain but does not feel as though pain significant limits his ability to do what he needs to do. R shoulder AROM now essentially equivalent to L with B shoulder strength 5/5. 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 He has not returned to the gym, but only because he is leary of risk for possible infection due to the multiple myeloma and feels that he would have no issues from his shoulder. All goals met for this episode and pt pleased with his progress, therefore will proceed with discharge form PT for this episode.    Rehab Potential  Good    Clinical Impairments Affecting Rehab Potential  multiple myeloma s/p stem cell transplant 08/22/2016 at [facility] - currently controlled but not in remission; multiple vertebral  compression fractures with kyphoplasties and vertbroloplasties    PT Treatment/Interventions  Patient/family education;Therapeutic exercise;Therapeutic activities;Neuromuscular re-education;Manual techniques;Passive range of motion;Dry needling;Taping;Electrical Stimulation;Moist Heat;Cryotherapy;Vasopneumatic Device;Iontophoresis 36m/ml Dexamethasone;ADLs/Self Care Home Management    PT Next Visit Plan  Discharge    Consulted and Agree with Plan of Care  Patient        Patient will benefit from skilled therapeutic intervention in order to improve the following deficits and impairments:  Pain, Postural dysfunction, Decreased range of motion, Decreased strength, Increased muscle spasms, Impaired flexibility, Impaired UE functional use, Decreased activity tolerance, Abnormal gait  Visit Diagnosis: Acute pain of right shoulder  Stiffness of right shoulder, not elsewhere classified  Abnormal posture  Muscle weakness (generalized)  Cramp and spasm     Problem List Patient Active Problem List   Diagnosis Date Noted  . Infection due to human metapneumovirus (hMPV) 02/04/2018  . Neutropenia with fever ([place] 02/02/2018  . SOB (shortness of breath)   . Pancytopenia ([place] 02/01/2018  . Bacteremia 11/10/2017  . Sepsis ([place] 11/09/2017  . HCAP (healthcare-associated pneumonia) 11/09/2017  . Thrombocytopenia ([place] 11/09/2017  . Iron deficiency anemia due to chronic blood loss   . Neutropenia ([place] 03/02/2017  . Cellulitis 03/01/2017  . Phlebitis or thrombophlebitis of lower extremity 07/21/2016  . Visit for preventive health examination 05/16/2016  . Aphthous ulcer 05/16/2016  . Ureterolithiasis 05/16/2016  . Acute deep vein thrombosis (DVT) of femoral vein of left lower extremity ([place] 03/21/2016  . Multiple myeloma ([facility] 02/22/2016  . Multiple myeloma not having achieved remission ([place] 02/22/2016  . Bilateral inguinal hernia (BIH), s/p lap repair 12/16/2013 11/02/2013   PHYSICAL THERAPY DISCHARGE SUMMARY  Visits from Start of Care: 20  Current functional level related to goals / functional outcomes:   Refer to above clinical impression.   Remaining deficits:   As above.   Education / Equipment:   HEP  Plan: Patient agrees to discharge.  Patient goals were met. Patient is being discharged due to meeting the stated rehab goals.  ?????      JPercival Spanish10/03/2018, 12:52 PM  [facility] [place] [address]Phone: [phone]  Fax:  [phone] Name: MEden RhoMRN: [id number] of Birth: [birth year]

## 2018-04-26 ENCOUNTER — Other Ambulatory Visit: Payer: Self-pay | Admitting: *Deleted

## 2018-04-26 DIAGNOSIS — C9000 Multiple myeloma not having achieved remission: Secondary | ICD-10-CM

## 2018-04-26 MED ORDER — LENALIDOMIDE 5 MG PO CAPS: ORAL_CAPSULE | ORAL | 0 refills | Status: DC

## 2018-04-30 ENCOUNTER — Other Ambulatory Visit: Payer: Self-pay | Admitting: Hematology & Oncology

## 2018-04-30 ENCOUNTER — Other Ambulatory Visit: Payer: Self-pay | Admitting: Medical

## 2018-05-07 ENCOUNTER — Other Ambulatory Visit: Payer: Self-pay | Admitting: *Deleted

## 2018-05-07 DIAGNOSIS — C9000 Multiple myeloma not having achieved remission: Secondary | ICD-10-CM

## 2018-05-07 MED ORDER — LENALIDOMIDE 5 MG PO CAPS: ORAL_CAPSULE | ORAL | 0 refills | Status: DC

## 2018-05-10 ENCOUNTER — Encounter: Payer: Self-pay | Admitting: Physical Therapy

## 2018-05-18 ENCOUNTER — Encounter: Payer: Self-pay | Admitting: Gastroenterology

## 2018-05-18 ENCOUNTER — Ambulatory Visit (INDEPENDENT_AMBULATORY_CARE_PROVIDER_SITE_OTHER): Payer: 59 | Admitting: Gastroenterology

## 2018-05-18 ENCOUNTER — Encounter: Payer: Self-pay | Admitting: Hematology & Oncology

## 2018-05-18 ENCOUNTER — Telehealth: Payer: Self-pay | Admitting: *Deleted

## 2018-05-18 ENCOUNTER — Inpatient Hospital Stay: Payer: 59

## 2018-05-18 ENCOUNTER — Inpatient Hospital Stay: Payer: 59 | Attending: Hematology & Oncology | Admitting: Hematology & Oncology

## 2018-05-18 ENCOUNTER — Other Ambulatory Visit: Payer: Self-pay

## 2018-05-18 VITALS — BP 100/60 | HR 65 | Ht 67.0 in | Wt 185.1 lb

## 2018-05-18 VITALS — BP 110/67 | HR 83 | Temp 97.8°F | Resp 19 | Wt 184.8 lb

## 2018-05-18 DIAGNOSIS — Z9484 Stem cells transplant status: Secondary | ICD-10-CM | POA: Insufficient documentation

## 2018-05-18 DIAGNOSIS — D649 Anemia, unspecified: Secondary | ICD-10-CM

## 2018-05-18 DIAGNOSIS — C9000 Multiple myeloma not having achieved remission: Secondary | ICD-10-CM

## 2018-05-18 DIAGNOSIS — D509 Iron deficiency anemia, unspecified: Secondary | ICD-10-CM | POA: Diagnosis not present

## 2018-05-18 DIAGNOSIS — Z7901 Long term (current) use of anticoagulants: Secondary | ICD-10-CM

## 2018-05-18 DIAGNOSIS — K449 Diaphragmatic hernia without obstruction or gangrene: Secondary | ICD-10-CM

## 2018-05-18 DIAGNOSIS — D500 Iron deficiency anemia secondary to blood loss (chronic): Secondary | ICD-10-CM

## 2018-05-18 DIAGNOSIS — Z79899 Other long term (current) drug therapy: Secondary | ICD-10-CM | POA: Insufficient documentation

## 2018-05-18 DIAGNOSIS — K921 Melena: Secondary | ICD-10-CM | POA: Diagnosis not present

## 2018-05-18 LAB — CMP (CANCER CENTER ONLY)
ALK PHOS: 52 U/L (ref 26–84)
ALT: 45 U/L (ref 10–47)
AST: 38 U/L (ref 11–38)
Albumin: 3.2 g/dL — ABNORMAL LOW (ref 3.5–5.0)
Anion gap: 5 (ref 5–15)
BUN: 11 mg/dL (ref 7–22)
CALCIUM: 9.2 mg/dL (ref 8.0–10.3)
CO2: 26 mmol/L (ref 18–33)
Chloride: 108 mmol/L (ref 98–108)
Creatinine: 0.9 mg/dL (ref 0.60–1.20)
GLUCOSE: 146 mg/dL — AB (ref 73–118)
Potassium: 4 mmol/L (ref 3.3–4.7)
Sodium: 139 mmol/L (ref 128–145)
TOTAL PROTEIN: 6.3 g/dL — AB (ref 6.4–8.1)
Total Bilirubin: 0.5 mg/dL (ref 0.2–1.6)

## 2018-05-18 LAB — IRON AND TIBC
Iron: 17 ug/dL — ABNORMAL LOW (ref 42–163)
Saturation Ratios: 4 % — ABNORMAL LOW (ref 20–55)
TIBC: 455 ug/dL — ABNORMAL HIGH (ref 202–409)
UIBC: 438 ug/dL — ABNORMAL HIGH (ref 117–376)

## 2018-05-18 LAB — CBC WITH DIFFERENTIAL (CANCER CENTER ONLY)
ABS IMMATURE GRANULOCYTES: 0.03 10*3/uL (ref 0.00–0.07)
BASOS PCT: 1 %
Basophils Absolute: 0 10*3/uL (ref 0.0–0.1)
Eosinophils Absolute: 0.1 10*3/uL (ref 0.0–0.5)
Eosinophils Relative: 5 %
HCT: 26.3 % — ABNORMAL LOW (ref 39.0–52.0)
HEMOGLOBIN: 7.9 g/dL — AB (ref 13.0–17.0)
IMMATURE GRANULOCYTES: 1 %
LYMPHS ABS: 1 10*3/uL (ref 0.7–4.0)
LYMPHS PCT: 35 %
MCH: 28.7 pg (ref 26.0–34.0)
MCHC: 30 g/dL (ref 30.0–36.0)
MCV: 95.6 fL (ref 80.0–100.0)
Monocytes Absolute: 0.3 10*3/uL (ref 0.1–1.0)
Monocytes Relative: 10 %
NEUTROS ABS: 1.4 10*3/uL — AB (ref 1.7–7.7)
NEUTROS PCT: 48 %
PLATELETS: 254 10*3/uL (ref 150–400)
RBC: 2.75 MIL/uL — ABNORMAL LOW (ref 4.22–5.81)
RDW: 16.3 % — ABNORMAL HIGH (ref 11.5–15.5)
WBC Count: 2.8 10*3/uL — ABNORMAL LOW (ref 4.0–10.5)
nRBC: 0 % (ref 0.0–0.2)

## 2018-05-18 LAB — FERRITIN: FERRITIN: 21 ng/mL — AB (ref 24–336)

## 2018-05-18 MED ORDER — SODIUM CHLORIDE 0.9 % IV SOLN
Freq: Once | INTRAVENOUS | Status: AC
  Administered 2018-05-18: 10:00:00 via INTRAVENOUS
  Filled 2018-05-18: qty 250

## 2018-05-18 MED ORDER — ZOLEDRONIC ACID 4 MG/100ML IV SOLN
4.0000 mg | Freq: Once | INTRAVENOUS | Status: AC
  Administered 2018-05-18: 4 mg via INTRAVENOUS
  Filled 2018-05-18: qty 100

## 2018-05-18 MED ORDER — PANTOPRAZOLE SODIUM 20 MG PO TBEC: 20.0000 mg | DELAYED_RELEASE_TABLET | Freq: Two times a day (BID) | ORAL | 6 refills | Status: DC

## 2018-05-18 NOTE — Progress Notes (Signed)
Chief Complaint: Dark stools, anemia   Referring Provider:     Burney Gauze, MD    HPI:     Jorge [name] is a 62 y.o. male with a history of multiple myeloma s/p autologous stem cell transplant at [facility] in February 2018 referred to the Gastroenterology Clinic for expedited evaluation of anemia and melenic stools.  He was seen by Dr. Marin Olp earlier today.  He endorses fatigue for the last week or so along with dark stools.  Rectal exam by Oncologist notable for heme positive earlier today.  Of note, he is on Xarelto for history of bilateral lower extremity DVT.  He has no prior history of GI bleed, and otherwise without abdominal pain, nausea, vomiting, early satiety.  Has actually gained 10 pounds over the last 6 weeks or so.  No prior EGD.  Last colonoscopy was at [place] in 12/2016 and notable for left-sided diverticulosis but otherwise normal, with recommendation repeat in 5 years for ongoing screening.  CBC earlier today notable for hemoglobin/hematocrit 7.9/26.3 with MCV/RDW 95.6/16.3.  Previous CBC on 04/06/2018 notable for hemoglobin/hematocrit 13/39.3 with similar MCV but RDW 22.4.  Has had fluctuating anemia to include hemoglobin 8.8 in July.  Normal BUN earlier today.  Iron panel pending from earlier today.  In September, had ferritin 30, iron 59, TIBC 400, iron sat 15%   Interestingly, he was on Revlimid earlier this year when anemic, then stopped for 6 weeks with repeat CBC demonstrating normalization of hemoglobin in September.  Has since restarted the Revlimid with today's CBC as above.  Endoscopic history: -Colonoscopy (12/2016, Dr. Penelope Coop): Left-sided diverticulosis, otherwise normal.  Repeat PA in 5 years - Colonoscopy (2013): Diverticulosis - Colonoscopy (2008): No report noted on review of recent notes from [facility]  Large hiatal hernia noted on prior imaging studies.   Past Medical History:  Diagnosis Date  . Anxiety   . Bone metastasis ([place])   .  Chest cold 05/19/2016   productive cough  -- started on antibiotic  . Chronic back pain    due to bone mets from myeloma  . Cough   . Depression   . GERD (gastroesophageal reflux disease)   . Hiatal hernia   . History of chicken pox   . History of concussion    age 35 -- no residual  . History of DVT of lower extremity 03/21/2016  treated and completed w/ xarelto   per doppler left extensive occlusion common femoral, femoral, and popliteal veins and right partial occlusion common femoral and profunda femoral veins/  last doppler 06-04-2016 no evidence acute or chronic dvt noted either leg   . History of radiation therapy 06/09/16-06/23/16   lower thoracic spine 25 Gy in 10 fractions  . Mouth ulcers    secondary to radiation  . Multiple myeloma ([place]) dx 02/22/2016 via bone marrow bx---  oncologist-  [name]   IgG Kappa-- Hyperdiploid/ +11 w/ bone mets--  current treatment chemotherapy (started 08/ 2017)and pallitive radiation to back started 06-09-2016  . Renal calculus, right   . Wears contact lenses      Past Surgical History:  Procedure Laterality Date  . COLONOSCOPY  [id number]  . CYSTOSCOPY W/ URETERAL STENT PLACEMENT Right 06/20/2016   Procedure: CYSTOSCOPY WITH STENT REPLACEMENT;  Surgeon: Kathie Rhodes, MD;  Location: [facility];  Service: Urology;  Laterality: Right;  . CYSTOSCOPY WITH RETROGRADE PYELOGRAM, URETEROSCOPY AND STENT PLACEMENT Right  05/30/2016   Procedure: CYSTOSCOPY WITH RETROGRADE PYELOGRAM, URETEROSCOPY AND STENT PLACEMENT,DILITATION URETERAL STRICTURE;  Surgeon: Kathie Rhodes, MD;  Location: [facility];  Service: Urology;  Laterality: Right;  . CYSTOSCOPY/RETROGRADE/URETEROSCOPY/STONE EXTRACTION WITH BASKET Right 06/20/2016   Procedure: CYSTOSCOPY/URETEROSCOPY/STONE EXTRACTION WITH BASKET;  Surgeon: Kathie Rhodes, MD;  Location: [facility];  Service: Urology;  Laterality: Right;  . HOLMIUM LASER APPLICATION Right 58/11/2776   Procedure:  HOLMIUM LASER APPLICATION;  Surgeon: Kathie Rhodes, MD;  Location: [facility];  Service: Urology;  Laterality: Right;  . IR GENERIC HISTORICAL  02/11/2016   IR RADIOLOGIST EVAL & MGMT 02/11/2016 [facility]  . IR GENERIC HISTORICAL  02/15/2016   IR BONE TUMOR(S)RF ABLATION 02/15/2016 Luanne Bras, MD [facility]  . IR GENERIC HISTORICAL  02/15/2016   IR BONE TUMOR(S)RF ABLATION 02/15/2016 Luanne Bras, MD [facility]  . IR GENERIC HISTORICAL  02/15/2016   IR BONE TUMOR(S)RF ABLATION 02/15/2016 Luanne Bras, MD [facility]  . IR GENERIC HISTORICAL  02/15/2016   IR KYPHO THORACIC WITH BONE BIOPSY 02/15/2016 Luanne Bras, MD [facility]  . IR GENERIC HISTORICAL  02/15/2016   IR KYPHO THORACIC WITH BONE BIOPSY 02/15/2016 Luanne Bras, MD [facility]  . IR GENERIC HISTORICAL  02/15/2016   IR VERTEBROPLASTY CERV/THOR BX INC UNI/BIL INC/INJECT/IMAGING 02/15/2016 Luanne Bras, MD [facility]  . IR GENERIC HISTORICAL  03/13/2016   IR KYPHO EA ADDL LEVEL THORACIC OR LUMBAR 03/13/2016 Luanne Bras, MD [facility]  . IR GENERIC HISTORICAL  03/13/2016   IR KYPHO EA ADDL LEVEL THORACIC OR LUMBAR 03/13/2016 Luanne Bras, MD [facility]  . IR GENERIC HISTORICAL  03/13/2016   IR BONE TUMOR(S)RF ABLATION 03/13/2016 Luanne Bras, MD [facility]  . IR GENERIC HISTORICAL  03/13/2016   IR KYPHO LUMBAR INC FX REDUCE BONE BX UNI/BIL CANNULATION INC/IMAGING 03/13/2016 Luanne Bras, MD [facility]  . IR GENERIC HISTORICAL  03/13/2016   IR BONE TUMOR(S)RF ABLATION 03/13/2016 Luanne Bras, MD [facility]  . IR GENERIC HISTORICAL  03/13/2016   IR BONE TUMOR(S)RF ABLATION 03/13/2016 Luanne Bras, MD [facility]  . IR GENERIC HISTORICAL  03/31/2016   IR RADIOLOGIST EVAL & MGMT 03/31/2016 [facility]  . LAPAROSCOPIC INGUINAL HERNIA REPAIR Bilateral 12-16-2013  dr gross  . RADIOLOGY WITH ANESTHESIA N/A 02/15/2016   Procedure: Spinal Ablation;  Surgeon:  Luanne Bras, MD;  Location: [place];  Service: Radiology;  Laterality: N/A;  . RADIOLOGY WITH ANESTHESIA N/A 03/13/2016   Procedure: LUMBER ABLATION;  Surgeon: Luanne Bras, MD;  Location: [place];  Service: Radiology;  Laterality: N/A;  . ROTATOR CUFF REPAIR Right 2003  . TONSILLECTOMY  age 89  . WISDOM TOOTH EXTRACTION     Family History  Problem Relation Age of Onset  . Uterine cancer Mother   . Heart disease Father   . Hypertension Father   . Multiple sclerosis Sister   . Paranoid behavior Brother   . Drug abuse Brother   . Schizophrenia Brother   . Stroke Maternal Grandfather   . Cancer Maternal Aunt   . Leukemia Paternal Aunt   . Healthy Son        x1  . Healthy Daughter        x2  . Allergies Daughter        x1  . Diabetes Neg Hx   . Alzheimer's disease Neg Hx   . Parkinson's disease Neg Hx    Social History   Tobacco Use  . Smoking status: Former Smoker  Packs/day: 1.00    Years: 7.00    Pack years: 7.00    Types: Cigarettes    Last attempt to quit: 11/02/1980    Years since quitting: 37.5  . Smokeless tobacco: Never Used  Substance Use Topics  . Alcohol use: Yes    Comment: occasional  . Drug use: No   Current Outpatient Medications  Medication Sig Dispense Refill  . acetaminophen (TYLENOL) 325 MG tablet Take 2 tablets (650 mg total) by mouth every 6 (six) hours as needed for mild pain (or Fever >/= 101).    [name] albuterol (PROVENTIL HFA;VENTOLIN HFA) 108 (90 Base) MCG/ACT inhaler Inhale 2 puffs into the lungs every 6 (six) hours as needed for wheezing or shortness of breath. 1 Inhaler 2  . amoxicillin-clavulanate (AUGMENTIN) 875-125 MG tablet Take 1 tablet by mouth 2 (two) times daily. 30 tablet 0  . benzonatate (TESSALON) 200 MG capsule Take 1 capsule (200 mg total) by mouth 3 (three) times daily as needed for cough. 60 capsule 0  . cetirizine (ZYRTEC) 10 MG tablet Take 10 mg by mouth daily as needed for allergies.    . cholecalciferol (VITAMIN D)  1000 units tablet Take 1,000 Units by mouth daily.    . famciclovir (FAMVIR) 500 MG tablet TAKE 1 TABLET BY MOUTH  DAILY 90 tablet 0  . fluticasone (FLONASE) 50 MCG/ACT nasal spray SPRAY 2 SPRAYS INTO EACH NOSTRIL EVERY DAY 16 g 1  . lenalidomide (REVLIMID) 5 MG capsule Take daily days 1-21. Hold 7 days. Repeat every 28 days. OQHU#[id number] 21 capsule 0  . lidocaine (LIDODERM) 5 % PLAEC 1 PATCH ONTO THE SKIN DAILY as needed for pain. APPLY 1 PATCH TO THE MOST PAINFUL AREA FOR 12 HR IN A 24 HR PERIOD  2  . magnesium oxide (MAG-OX) 400 MG tablet Take 400 mg by mouth daily.    . ondansetron (ZOFRAN-ODT) 8 MG disintegrating tablet Take 1 tablet (8 mg total) by mouth every 8 (eight) hours as needed. 60 tablet 1  . OVER THE COUNTER MEDICATION Take 1 tablet by mouth daily.     . polyethylene glycol powder (MIRALAX) powder Take 17 g by mouth daily. (Patient taking differently: Take 17 g by mouth daily as needed for mild constipation. ) 850 g 3  . Probiotic Product (PROBIOTIC PO) Take 1 capsule by mouth daily.    [name] rOPINIRole (REQUIP) 0.25 MG tablet TAKE 1 TABLET BY MOUTH 1 TO 3 HOURS BEFORE BED FOR 2 DAYS THEN INCREASE TO 2 TABS (Patient taking differently: TAKE 2 TABLETS (0.34m) BY MOUTH 1 TO 3 HOURS BEFORE BED as needed for restless legs.) 60 tablet 2  . sertraline (ZOLOFT) 100 MG tablet Take 100 mg by mouth at bedtime.   2  . XARELTO 10 MG TABS tablet Take 1 tablet (10 mg total) by mouth daily. (Patient taking differently: Take 10 mg by mouth every evening. ) 30 tablet 12  . Zoledronic Acid (ZOMETA IV) Inject 4 mg into the vein every 3 (three) months. Receives at Dr. EAntonieta Pertoffice.    .Marland Kitchenzolpidem (AMBIEN) 10 MG tablet Take 1 tablet (10 mg total) by mouth at bedtime as needed. 30 tablet 3   No current facility-administered medications for this visit.    No Known Allergies   Review of Systems: All systems reviewed and negative except where noted in HPI.     Physical Exam:    Wt Readings from  Last 3 Encounters:  05/18/18 184 lb 12.8 oz (83.8 kg)  04/19/18 175 lb (79.4 kg)  04/06/18 176 lb 1.9 oz (79.9 kg)    There were no vitals taken for this visit. Constitutional:  Pleasant, in no acute distress. Psychiatric: Normal mood and affect. Behavior is normal. EENT: Pupils normal.  Conjunctivae are normal. No scleral icterus. Neck supple. No cervical LAD. Cardiovascular: Normal rate, regular rhythm. No edema Pulmonary/chest: Effort normal and breath sounds normal. No wheezing, rales or rhonchi. Abdominal: Soft, nondistended, nontender. Bowel sounds active throughout. There are no masses palpable. No hepatomegaly. Neurological: Alert and oriented to person place and time. Skin: Skin is warm and dry. No rashes noted.   ASSESSMENT AND PLAN;   Jorge [name] is a 62 y.o. male presenting with:  1) Anemia: Discussed DDX for anemia particular in the setting of dark stools to include PUD, Cameron's lesions (has history of large hiatal hernia), gastritis, AVMs, etc. versus medication effect and will plan on expedited evaluation as below: - We will start high-dose PPI today with Protonix 20 mg p.o. twice daily until able to assess with EGD -Expedited EGD for diagnostic and potentially therapeutic intent -We will follow-up on pending iron panel  2) Melena: Recent dark stools with anemia on today's labs.  Will evaluate further as above.  Otherwise hemodynamically stable.  3) Hiatal hernia: Prior imaging studies notable for large hiatal hernia.  Will assess size of hernia and for presence of mucosal lesions at time of EGD as above.  4) Systemic anticoagulation: Currently prescribed Xarelto for history of DVTs. - Hold Xarelto 5 days before procedure - will instruct when and how to resume after procedure. Low but real risk of cardiovascular event such as heart attack, stroke, embolism, thrombosis or ischemia/infarct of other organs off Xarelto explained and need to seek urgent help if this  occurs. The patient consents to proceed. Will communicate by phone or EMR with patient's prescribing provider to confirm that holding Xarelto is reasonable in this case  5) Multiple myeloma: Seen by Dr. Marin Olp earlier today and continues to follow closely.  The indications, risks, and benefits of EGD were explained to the patient in detail. Risks include but are not limited to bleeding, perforation, adverse reaction to medications, and cardiopulmonary compromise. Sequelae include but are not limited to the possibility of surgery, hositalization, and mortality. The patient verbalized understanding and wished to proceed. All questions answered, referred to scheduler. Further recommendations pending results of the exam.       Lavena Bullion, DO, FACG  05/18/2018, 10:51 AM   Saguier, Percell Miller, PA-C

## 2018-05-18 NOTE — Patient Instructions (Signed)

## 2018-05-18 NOTE — Progress Notes (Signed)
Hematology and Oncology Follow Up Visit  Sufyan Meidinger [id number] 03-May-1956 62 y.o. 05/18/2018   Principle Diagnosis:  IgG Kappa myeloma - Hyperdiploid/+11 DVT of the LEFT and RIGHT leg  Iron deficiency anemia  Current Therapy:   Revlimid 47m po q day (21 on/7 off) - start on 04/07/2018   Zometa 4 mg IV q 3 months- next dose is 08/2018 Xarelto 10 mg PO daily IV Iron as needed   Interim History:  Mr. PVolleris here today with his wife for follow-up.  Surprisingly, he does feel little bit tired.  This been going on for about a week or so.  He restarted the low-dose Revlimid when we last saw him a month ago.  He is on a 5 mg a day.  When we last saw him, his iron studies were a little bit low.  His iron saturation was 15%.  His ferritin was only 30.  He did get a dose of Feraheme.  He says that for 3 or 4 days a week ago, his stools were dark.  Of note, he is on Xarelto.  I did do a rectal exam on him in the office.  His stool was brown.  There was some slight heme positive aspect of it.  His myeloma really has not been an issue.  There is no M spike noted when we last saw him.  His IgG level was 800 mg/dL.  His Kappa Lightchain was 2.4 mg/dL.  His appetite is good.  He has had no nausea or vomiting.  He has had some muscle cramps.  He has had no fever.  There is been no cough.  He has had no leg swelling.  Overall, his performance status is ECOG 1.    Medications:  Allergies as of 05/18/2018   No Known Allergies     Medication List        Accurate as of 05/18/18  9:35 AM. Always use your most recent med list.          acetaminophen 325 MG tablet Commonly known as:  TYLENOL Take 2 tablets (650 mg total) by mouth every 6 (six) hours as needed for mild pain (or Fever >/= 101).   albuterol 108 (90 Base) MCG/ACT inhaler Commonly known as:  PROVENTIL HFA;VENTOLIN HFA Inhale 2 puffs into the lungs every 6 (six) hours as needed for wheezing or shortness of breath.     amoxicillin-clavulanate 875-125 MG tablet Commonly known as:  AUGMENTIN Take 1 tablet by mouth 2 (two) times daily.   benzonatate 200 MG capsule Commonly known as:  TESSALON Take 1 capsule (200 mg total) by mouth 3 (three) times daily as needed for cough.   cetirizine 10 MG tablet Commonly known as:  ZYRTEC Take 10 mg by mouth daily as needed for allergies.   cholecalciferol 1000 units tablet Commonly known as:  VITAMIN D Take 1,000 Units by mouth daily.   famciclovir 500 MG tablet Commonly known as:  FAMVIR TAKE 1 TABLET BY MOUTH  DAILY   fluticasone 50 MCG/ACT nasal spray Commonly known as:  FLONASE SPRAY 2 SPRAYS INTO EACH NOSTRIL EVERY DAY   lenalidomide 5 MG capsule Commonly known as:  REVLIMID Take daily days 1-21. Hold 7 days. Repeat every 28 days. [id number]  lidocaine 5 % Commonly known as:  LIDODERM PLAEC 1 PATCH ONTO THE SKIN DAILY as needed for pain. APPLY 1 PATCH TO THE MOST PAINFUL AREA FOR 12 HR IN A 24 HR PERIOD   magnesium oxide  400 MG tablet Commonly known as:  MAG-OX Take 400 mg by mouth daily.   ondansetron 8 MG disintegrating tablet Commonly known as:  ZOFRAN-ODT Take 1 tablet (8 mg total) by mouth every 8 (eight) hours as needed.   OVER THE COUNTER MEDICATION Take 1 tablet by mouth daily.   polyethylene glycol powder powder Commonly known as:  GLYCOLAX/MIRALAX Take 17 g by mouth daily.   PROBIOTIC PO Take 1 capsule by mouth daily.   rOPINIRole 0.25 MG tablet Commonly known as:  REQUIP TAKE 1 TABLET BY MOUTH 1 TO 3 HOURS BEFORE BED FOR 2 DAYS THEN INCREASE TO 2 TABS   sertraline 100 MG tablet Commonly known as:  ZOLOFT Take 100 mg by mouth at bedtime.   XARELTO 10 MG Tabs tablet Generic drug:  rivaroxaban Take 1 tablet (10 mg total) by mouth daily.   zolpidem 10 MG tablet Commonly known as:  AMBIEN Take 1 tablet (10 mg total) by mouth at bedtime as needed.   ZOMETA IV Inject 4 mg into the vein every 3 (three) months.  Receives at [name] office.       Allergies: No Known Allergies  Past Medical History, Surgical history, Social history, and Family History were reviewed and updated.  Review of Systems: Review of Systems  Constitutional: Negative.   HENT: Negative.   Eyes: Negative.   Respiratory: Negative.   Cardiovascular: Negative.   Gastrointestinal: Negative.   Genitourinary: Negative.   Musculoskeletal: Positive for myalgias.  Skin: Negative.   Neurological: Negative.   Endo/Heme/Allergies: Negative.   Psychiatric/Behavioral: Negative.      Physical Exam:  weight is 184 lb 12.8 oz (83.8 kg). His oral temperature is 97.8 F (36.6 C). His blood pressure is 110/67 and his pulse is 83. His respiration is 19 and oxygen saturation is 100%.   Wt Readings from Last 3 Encounters:  05/18/18 184 lb 12.8 oz (83.8 kg)  04/19/18 175 lb (79.4 kg)  04/06/18 176 lb 1.9 oz (79.9 kg)    Physical Exam  Constitutional: He is oriented to person, place, and time.  HENT:  Head: Normocephalic and atraumatic.  Mouth/Throat: Oropharynx is clear and moist.  Eyes: Pupils are equal, round, and reactive to light. EOM are normal.  Neck: Normal range of motion.  Cardiovascular: Normal rate, regular rhythm and normal heart sounds.  Pulmonary/Chest: Effort normal and breath sounds normal.  Abdominal: Soft. Bowel sounds are normal.  Musculoskeletal: Normal range of motion. He exhibits no edema, tenderness or deformity.  Lymphadenopathy:    He has no cervical adenopathy.  Neurological: He is alert and oriented to person, place, and time.  Skin: Skin is warm and dry. No rash noted. No erythema.  Psychiatric: He has a normal mood and affect. His behavior is normal. Judgment and thought content normal.  Vitals reviewed.    Lab Results  Component Value Date   WBC 2.8 (L) 05/18/2018   HGB 7.9 (L) 05/18/2018   HCT 26.3 (L) 05/18/2018   MCV 95.6 05/18/2018   PLT 254 05/18/2018   Lab Results   Component Value Date   FERRITIN 30 04/06/2018   IRON 59 04/06/2018   TIBC 400 04/06/2018   UIBC 341 04/06/2018   IRONPCTSAT 15 (L) 04/06/2018   Lab Results  Component Value Date   RETICCTPCT 1.1 03/05/2017   RBC 2.75 (L) 05/18/2018   Lab Results  Component Value Date   KPAFRELGTCHN 23.8 (H) 04/06/2018   LAMBDASER 10.8 04/06/2018   KAPLAMBRATIO 2.20 (H) 04/06/2018  Lab Results  Component Value Date   VPLWUZRV 234 04/06/2018   IGA 135 04/06/2018   IGMSERUM 32 04/06/2018   Lab Results  Component Value Date   TOTALPROTELP 6.1 04/06/2018   ALBUMINELP 3.5 04/06/2018   A1GS 0.3 04/06/2018   A2GS 0.7 04/06/2018   BETS 0.9 04/06/2018   GAMS 0.8 04/06/2018   MSPIKE Not Observed 04/06/2018   SPEI Comment 01/05/2018     Chemistry      Component Value Date/Time   NA 139 05/18/2018 0822   NA 143 06/30/2017 1049   NA 140 10/03/2016 0845   K 4.0 05/18/2018 0822   K 4.0 06/30/2017 1049   K 4.3 10/03/2016 0845   CL 108 05/18/2018 0822   CL 105 06/30/2017 1049   CO2 26 05/18/2018 0822   CO2 27 06/30/2017 1049   CO2 26 10/03/2016 0845   BUN 11 05/18/2018 0822   BUN 13 06/30/2017 1049   BUN 11.9 10/03/2016 0845   CREATININE 0.90 05/18/2018 0822   CREATININE 1.0 06/30/2017 1049   CREATININE 0.9 10/03/2016 0845      Component Value Date/Time   CALCIUM 9.2 05/18/2018 0822   CALCIUM 9.0 06/30/2017 1049   CALCIUM 9.9 10/03/2016 0845   ALKPHOS 52 05/18/2018 0822   ALKPHOS 57 06/30/2017 1049   ALKPHOS 80 10/03/2016 0845   AST 38 05/18/2018 0822   AST 28 10/03/2016 0845   ALT 45 05/18/2018 0822   ALT 43 06/30/2017 1049   ALT 26 10/03/2016 0845   BILITOT 0.5 05/18/2018 0822   BILITOT 0.48 10/03/2016 0845      Impression and Plan: Mr. Floren is a pleasant 62 yo gentleman with IgG kappa myeloma.  He ultimately underwent a autologous stem cell transplant at [facility] in February 2018.   Her primary now is this anemia.  I just do not think this is from the Revlimid.  I would  think if this was from Revlimid, that his platelet count would also be down.  His white cell count is down a little bit.  His platelet count is not bad.  In fact, his blood counts on the higher side for him which makes me think that he is bleeding.  We need to have him see gastroenterology.  I spoke with 1 of our new gastroenterologist in the building.  He will see Mr. Voris today.  Mr. Grime will stop the Xarelto for right now.  I told him to take some antiacid.  His wife has some Pepcid.  I would like to see him back in another couple weeks or so.  We really have to stay on top of this problem.     [name], MD 11/5/20199:35 AM

## 2018-05-18 NOTE — Telephone Encounter (Addendum)
Patient aware of results. Appointment made  ----- Message from [name], MD sent at 05/18/2018  1:26 PM EST ----- Call - iron is very low!!  Will need 2 doses of Feraheme!!!  Start this week!!! Jorge [name]

## 2018-05-19 LAB — PROTEIN ELECTROPHORESIS, SERUM, WITH REFLEX
A/G RATIO SPE: 1.4 (ref 0.7–1.7)
ALBUMIN ELP: 3.7 g/dL (ref 2.9–4.4)
ALPHA-1-GLOBULIN: 0.3 g/dL (ref 0.0–0.4)
ALPHA-2-GLOBULIN: 0.6 g/dL (ref 0.4–1.0)
BETA GLOBULIN: 1 g/dL (ref 0.7–1.3)
GAMMA GLOBULIN: 0.8 g/dL (ref 0.4–1.8)
Globulin, Total: 2.6 g/dL (ref 2.2–3.9)
Total Protein ELP: 6.3 g/dL (ref 6.0–8.5)

## 2018-05-19 LAB — IGG, IGA, IGM
IGA: 210 mg/dL (ref 61–437)
IGM (IMMUNOGLOBULIN M), SRM: 34 mg/dL (ref 20–172)
IgG (Immunoglobin G), Serum: 827 mg/dL (ref 700–1600)

## 2018-05-19 LAB — KAPPA/LAMBDA LIGHT CHAINS
KAPPA, LAMDA LIGHT CHAIN RATIO: 2.24 — AB (ref 0.26–1.65)
Kappa free light chain: 33.6 mg/L — ABNORMAL HIGH (ref 3.3–19.4)
Lambda free light chains: 15 mg/L (ref 5.7–26.3)

## 2018-05-20 ENCOUNTER — Telehealth: Payer: Self-pay

## 2018-05-20 ENCOUNTER — Inpatient Hospital Stay: Payer: 59

## 2018-05-20 VITALS — BP 121/63 | HR 92 | Temp 98.1°F | Resp 16

## 2018-05-20 DIAGNOSIS — C9000 Multiple myeloma not having achieved remission: Secondary | ICD-10-CM

## 2018-05-20 MED ORDER — SODIUM CHLORIDE 0.9 % IV SOLN
INTRAVENOUS | Status: DC
  Administered 2018-05-20: 13:00:00 via INTRAVENOUS
  Filled 2018-05-20: qty 250

## 2018-05-20 MED ORDER — SODIUM CHLORIDE 0.9 % IV SOLN
510.0000 mg | Freq: Once | INTRAVENOUS | Status: AC
  Administered 2018-05-20: 510 mg via INTRAVENOUS
  Filled 2018-05-20: qty 17

## 2018-05-20 NOTE — Telephone Encounter (Signed)
Cardiac Clearance faxed.

## 2018-05-20 NOTE — Patient Instructions (Signed)

## 2018-05-24 ENCOUNTER — Other Ambulatory Visit: Payer: Self-pay | Admitting: *Deleted

## 2018-05-24 ENCOUNTER — Other Ambulatory Visit: Payer: Self-pay | Admitting: Family

## 2018-05-24 ENCOUNTER — Telehealth: Payer: Self-pay | Admitting: *Deleted

## 2018-05-24 ENCOUNTER — Telehealth: Payer: Self-pay

## 2018-05-24 DIAGNOSIS — C9000 Multiple myeloma not having achieved remission: Secondary | ICD-10-CM

## 2018-05-24 NOTE — Telephone Encounter (Signed)
Message received from patient stating that he had increased pain to the "base of his back, hips, spine and shoulders over the weekend" and would like to know if Dr. Marin Olp would like to schedule an MRI d/t his increased pain.  Dr. Marin Olp notified and would like for pt to have a PET scan.  Call placed back to patient to notify him of MD orders.  Pt appreciative of call back and has no further questions at this time.

## 2018-05-24 NOTE — Telephone Encounter (Signed)
Called patient LMOM to notify him of Dr. Minerva Fester instructions to hold Xarelto for 2 days prior to procedure and to restart the following day. If he should have any Biopsys taken during procedure he can hold Xarelto the day after procedure as well and restart the next day if no signs of active bleeding. Expecting a call back.

## 2018-05-25 ENCOUNTER — Inpatient Hospital Stay: Payer: 59

## 2018-05-25 DIAGNOSIS — C9000 Multiple myeloma not having achieved remission: Secondary | ICD-10-CM | POA: Diagnosis not present

## 2018-05-25 LAB — CMP (CANCER CENTER ONLY)
ALBUMIN: 3.7 g/dL (ref 3.5–5.0)
ALK PHOS: 63 U/L (ref 38–126)
ALT: 27 U/L (ref 0–44)
ANION GAP: 9 (ref 5–15)
AST: 26 U/L (ref 15–41)
BILIRUBIN TOTAL: 0.3 mg/dL (ref 0.3–1.2)
BUN: 11 mg/dL (ref 8–23)
CALCIUM: 9.6 mg/dL (ref 8.9–10.3)
CO2: 25 mmol/L (ref 22–32)
Chloride: 108 mmol/L (ref 98–111)
Creatinine: 1.03 mg/dL (ref 0.61–1.24)
GFR, Est AFR Am: >60 mL/min (ref >60)
GLUCOSE: 115 mg/dL — AB (ref 70–99)
Potassium: 4.7 mmol/L (ref 3.5–5.1)
Sodium: 142 mmol/L (ref 135–145)
TOTAL PROTEIN: 7.2 g/dL (ref 6.5–8.1)

## 2018-05-25 LAB — CBC WITH DIFFERENTIAL (CANCER CENTER ONLY)
Abs Immature Granulocytes: 0.15 10*3/uL — ABNORMAL HIGH (ref 0.00–0.07)
BASOS ABS: 0 10*3/uL (ref 0.0–0.1)
Basophils Relative: 1 %
EOS ABS: 0.2 10*3/uL (ref 0.0–0.5)
EOS PCT: 5 %
HEMATOCRIT: 31.9 % — AB (ref 39.0–52.0)
Hemoglobin: 9.2 g/dL — ABNORMAL LOW (ref 13.0–17.0)
Immature Granulocytes: 4 %
LYMPHS ABS: 1.2 10*3/uL (ref 0.7–4.0)
Lymphocytes Relative: 28 %
MCH: 29.2 pg (ref 26.0–34.0)
MCHC: 28.8 g/dL — AB (ref 30.0–36.0)
MCV: 101.3 fL — ABNORMAL HIGH (ref 80.0–100.0)
MONOS PCT: 14 %
Monocytes Absolute: 0.6 10*3/uL (ref 0.1–1.0)
NRBC: 2.3 % — AB (ref 0.0–0.2)
Neutro Abs: 2.1 10*3/uL (ref 1.7–7.7)
Neutrophils Relative %: 48 %
Platelet Count: 221 10*3/uL (ref 150–400)
RBC: 3.15 MIL/uL — ABNORMAL LOW (ref 4.22–5.81)
RDW: 19.9 % — ABNORMAL HIGH (ref 11.5–15.5)
WBC Count: 4.3 10*3/uL (ref 4.0–10.5)

## 2018-05-25 NOTE — Telephone Encounter (Signed)
Patient verbalized understanding of orders.

## 2018-05-26 ENCOUNTER — Ambulatory Visit (AMBULATORY_SURGERY_CENTER): Payer: 59 | Admitting: Gastroenterology

## 2018-05-26 ENCOUNTER — Telehealth: Payer: Self-pay | Admitting: Hematology & Oncology

## 2018-05-26 ENCOUNTER — Encounter: Payer: Self-pay | Admitting: Gastroenterology

## 2018-05-26 VITALS — BP 112/72 | HR 70 | Temp 96.0°F | Resp 20 | Ht 67.0 in | Wt 185.0 lb

## 2018-05-26 DIAGNOSIS — K449 Diaphragmatic hernia without obstruction or gangrene: Secondary | ICD-10-CM | POA: Diagnosis not present

## 2018-05-26 DIAGNOSIS — K2970 Gastritis, unspecified, without bleeding: Secondary | ICD-10-CM | POA: Diagnosis not present

## 2018-05-26 DIAGNOSIS — D649 Anemia, unspecified: Secondary | ICD-10-CM | POA: Diagnosis not present

## 2018-05-26 DIAGNOSIS — K259 Gastric ulcer, unspecified as acute or chronic, without hemorrhage or perforation: Secondary | ICD-10-CM | POA: Diagnosis not present

## 2018-05-26 DIAGNOSIS — K921 Melena: Secondary | ICD-10-CM

## 2018-05-26 DIAGNOSIS — K2950 Unspecified chronic gastritis without bleeding: Secondary | ICD-10-CM | POA: Diagnosis not present

## 2018-05-26 MED ORDER — SODIUM CHLORIDE 0.9 % IV SOLN: 500.0000 mL | Freq: Once | INTRAVENOUS | Status: DC

## 2018-05-26 NOTE — Progress Notes (Signed)
Left message for Judeen Hammans RN about surgical referral, pt was told to resume Xarelto today.

## 2018-05-26 NOTE — Telephone Encounter (Signed)
Appointment for PET Scan scheduled and patient will get updated schedule at next visit on 11/14

## 2018-05-26 NOTE — Progress Notes (Signed)
Report given to PACU, vss 

## 2018-05-26 NOTE — Progress Notes (Signed)
Called to room to assist during endoscopic procedure.  Patient ID and intended procedure confirmed with present staff. Received instructions for my participation in the procedure from the performing physician.  

## 2018-05-26 NOTE — Op Note (Signed)
[place] Patient Name: Jorge [name] Procedure Date: 05/26/2018 7:57 AM MRN: [id number] Endoscopist: Gerrit Heck , MD Age: 62 Referring MD:  Date of Birth: May 30, 1956 Gender: Male Account #: [id number] Procedure:                Upper GI endoscopy Indications:              Iron deficiency anemia, Heme positive stool, Melena Medicines:                Monitored Anesthesia Care Procedure:                Pre-Anesthesia Assessment:                           - Prior to the procedure, a History and Physical                            was performed, and patient medications and                            allergies were reviewed. The patient's tolerance of                            previous anesthesia was also reviewed. The risks                            and benefits of the procedure and the sedation                            options and risks were discussed with the patient.                            All questions were answered, and informed consent                            was obtained. Prior Anticoagulants: The patient has                            taken Xarelto (rivaroxaban), last dose was 5 days                            prior to procedure. ASA Grade Assessment: II - A                            patient with mild systemic disease. After reviewing                            the risks and benefits, the patient was deemed in                            satisfactory condition to undergo the procedure.                           After obtaining informed consent, the endoscope was  passed under direct vision. Throughout the                            procedure, the patient's blood pressure, pulse, and                            oxygen saturations were monitored continuously. The                            Endoscope was introduced through the mouth, and                            advanced to the second part of duodenum. The upper             GI endoscopy was accomplished without difficulty.                            The patient tolerated the procedure well. Scope In: Scope Out: Findings:                 Esophagogastric landmarks were identified: the                            Z-line was found at 35 cm, the gastroesophageal                            junction was found at 35 cm and the site of hiatal                            narrowing was found at 46 cm from the incisors.                           A 11 cm hiatal hernia was present.                           The Z-line was regular and was found 35 cm from the                            incisors.                           The lower third of the esophagus was mildly                            tortuous.                           Localized mild inflammation characterized by                            erythema and friability was found in the gastric                            body at the location of the diaphragmatic hiatus.  No endoscopic intervention indicated for findings,                            but biopsies were taken with a cold forceps for                            histology. Estimated blood loss was minimal.                           Localized mild inflammation characterized by                            erythema was found in the gastric antrum. Biopsies                            were taken with a cold forceps for Helicobacter                            pylori testing. Estimated blood loss was minimal.                           The duodenal bulb, first portion of the duodenum                            and second portion of the duodenum were normal. Complications:            No immediate complications. Estimated Blood Loss:     Estimated blood loss was minimal. Impression:               - Esophagogastric landmarks identified.                           - 11 cm hiatal hernia with mucosal erythema and                             friability noted at the diaphragmatic hiatus. This                            is the likely source for intermittent melena and                            potentially iron deficiency anemia. Recommend                            surgical correction.                           - Z-line regular, 35 cm from the incisors.                           - Tortuous esophagus.                           - Gastritis. Biopsied.                           -  Gastritis. Biopsied.                           - Normal duodenal bulb, first portion of the                            duodenum and second portion of the duodenum. Recommendation:           - Patient has a contact number available for                            emergencies. The signs and symptoms of potential                            delayed complications were discussed with the                            patient. Return to normal activities tomorrow.                            Written discharge instructions were provided to the                            patient.                           - Resume previous diet today.                           - Continue present medications.                           - Await pathology results.                           - Refer to a surgeon at appointment to be scheduled                            for hiatal hernia repair. Gerrit Heck, MD 05/26/2018 8:20:02 AM

## 2018-05-26 NOTE — Patient Instructions (Signed)
YOU HAD AN ENDOSCOPIC PROCEDURE TODAY AT [facility]:   Refer to the procedure report that was given to you for any specific questions about what was found during the examination.  If the procedure report does not answer your questions, please call your gastroenterologist to clarify.  If you requested that your care partner not be given the details of your procedure findings, then the procedure report has been included in a sealed envelope for you to review at your convenience later.  YOU SHOULD EXPECT: Some feelings of bloating in the abdomen. Passage of more gas than usual.  Walking can help get rid of the air that was put into your GI tract during the procedure and reduce the bloating. If you had a lower endoscopy (such as a colonoscopy or flexible sigmoidoscopy) you may notice spotting of blood in your stool or on the toilet paper. If you underwent a bowel prep for your procedure, you may not have a normal bowel movement for a few days.  Please Note:  You might notice some irritation and congestion in your nose or some drainage.  This is from the oxygen used during your procedure.  There is no need for concern and it should clear up in a day or so.  SYMPTOMS TO REPORT IMMEDIATELY:    Following upper endoscopy (EGD)  Vomiting of blood or coffee ground material  New chest pain or pain under the shoulder blades  Painful or persistently difficult swallowing  New shortness of breath  Fever of 100F or higher  Black, tarry-looking stools  For urgent or emergent issues, a gastroenterologist can be reached at any hour by calling [phone].   DIET:  We do recommend a small meal at first, but then you may proceed to your regular diet.  Drink plenty of fluids but you should avoid alcoholic beverages for 24 hours.  ACTIVITY:  You should plan to take it easy for the rest of today and you should NOT DRIVE or use heavy machinery until tomorrow (because of the sedation medicines used  during the test).    FOLLOW UP: Our staff will call the number listed on your records the next business day following your procedure to check on you and address any questions or concerns that you may have regarding the information given to you following your procedure. If we do not reach you, we will leave a message.  However, if you are feeling well and you are not experiencing any problems, there is no need to return our call.  We will assume that you have returned to your regular daily activities without incident.  If any biopsies were taken you will be contacted by phone or by letter within the next 1-3 weeks.  Please call us at [phone] if you have not heard about the biopsies in 3 weeks.   Await for biopsy results Gastritis (handout given) Hiatal Hernia (handout given) Refer to a surgeon at appointment to be scheduled for hiatal hernia repair.  SIGNATURES/CONFIDENTIALITY: You and/or your care partner have signed paperwork which will be entered into your electronic medical record.  These signatures attest to the fact that that the information above on your After Visit Summary has been reviewed and is understood.  Full responsibility of the confidentiality of this discharge information lies with you and/or your care-partner.

## 2018-05-27 ENCOUNTER — Other Ambulatory Visit: Payer: Self-pay | Admitting: Family

## 2018-05-27 ENCOUNTER — Inpatient Hospital Stay: Payer: 59

## 2018-05-27 ENCOUNTER — Telehealth: Payer: Self-pay

## 2018-05-27 NOTE — Telephone Encounter (Signed)
  Follow up Call-  Call back number 05/26/2018  Post procedure Call Back phone  # [phone]  Permission to leave phone message Yes  Some recent data might be hidden     Left message on machine

## 2018-05-27 NOTE — Telephone Encounter (Signed)
  Follow up Call-  Call back number 05/26/2018  Post procedure Call Back phone  # [phone]  Permission to leave phone message Yes  Some recent data might be hidden     Second post procedure follow up call, no answer

## 2018-05-28 ENCOUNTER — Ambulatory Visit: Payer: 59

## 2018-05-28 ENCOUNTER — Inpatient Hospital Stay: Payer: 59

## 2018-05-28 ENCOUNTER — Telehealth: Payer: Self-pay | Admitting: Gastroenterology

## 2018-05-28 VITALS — BP 111/70 | HR 69 | Temp 97.6°F | Resp 16

## 2018-05-28 DIAGNOSIS — C9000 Multiple myeloma not having achieved remission: Secondary | ICD-10-CM | POA: Diagnosis not present

## 2018-05-28 DIAGNOSIS — Z79899 Other long term (current) drug therapy: Secondary | ICD-10-CM | POA: Diagnosis not present

## 2018-05-28 DIAGNOSIS — D509 Iron deficiency anemia, unspecified: Secondary | ICD-10-CM | POA: Diagnosis not present

## 2018-05-28 MED ORDER — SODIUM CHLORIDE 0.9 % IV SOLN
INTRAVENOUS | Status: DC
  Administered 2018-05-28: 09:00:00 via INTRAVENOUS
  Filled 2018-05-28: qty 250

## 2018-05-28 MED ORDER — SODIUM CHLORIDE 0.9 % IV SOLN
510.0000 mg | Freq: Once | INTRAVENOUS | Status: AC
  Administered 2018-05-28: 510 mg via INTRAVENOUS
  Filled 2018-05-28: qty 17

## 2018-05-28 NOTE — Telephone Encounter (Signed)
Pt calling to ask Dr. Bryan Lemma if he wants to refer him to a specific surgeon for hiatal hernia repair. Pls call pt.

## 2018-05-28 NOTE — Telephone Encounter (Signed)
Called and spoke with patient- informed patient the paperwork has already been faxed to [facility] per Dr. Vivia Ewing order;  Informed patient the [facility] would contact him with an appt date/time once they have had a chance to review his paperwork; patient verbalized understanding of information; patient also advised to call back if questions/concerns arise;

## 2018-05-28 NOTE — Patient Instructions (Signed)

## 2018-05-31 ENCOUNTER — Other Ambulatory Visit: Payer: Self-pay | Admitting: *Deleted

## 2018-05-31 ENCOUNTER — Other Ambulatory Visit: Payer: Self-pay | Admitting: Hematology & Oncology

## 2018-05-31 DIAGNOSIS — C9000 Multiple myeloma not having achieved remission: Secondary | ICD-10-CM

## 2018-05-31 MED ORDER — LENALIDOMIDE 5 MG PO CAPS: ORAL_CAPSULE | ORAL | 0 refills | Status: DC

## 2018-06-01 ENCOUNTER — Telehealth: Payer: Self-pay | Admitting: Hematology & Oncology

## 2018-06-01 ENCOUNTER — Encounter: Payer: Self-pay | Admitting: Gastroenterology

## 2018-06-01 NOTE — Telephone Encounter (Signed)
Spoke with pt to inform him of canceled PET scan for 11/21 due to insurance authorization still pending. Pt aware we will sch an appt once authorized

## 2018-06-02 ENCOUNTER — Telehealth: Payer: Self-pay | Admitting: Hematology & Oncology

## 2018-06-02 NOTE — Telephone Encounter (Signed)
Spoke with patient regarding appointment for PET Scan/ Date/Time/instructions for NPO 6 hrs prior

## 2018-06-03 ENCOUNTER — Telehealth: Payer: Self-pay | Admitting: *Deleted

## 2018-06-03 ENCOUNTER — Other Ambulatory Visit: Payer: Self-pay | Admitting: Family

## 2018-06-03 ENCOUNTER — Ambulatory Visit (HOSPITAL_COMMUNITY): Payer: 59

## 2018-06-07 ENCOUNTER — Other Ambulatory Visit: Payer: Self-pay | Admitting: Family

## 2018-06-07 DIAGNOSIS — M7581 Other shoulder lesions, right shoulder: Secondary | ICD-10-CM | POA: Diagnosis not present

## 2018-06-07 DIAGNOSIS — C9000 Multiple myeloma not having achieved remission: Secondary | ICD-10-CM

## 2018-06-08 ENCOUNTER — Inpatient Hospital Stay: Payer: 59

## 2018-06-08 ENCOUNTER — Other Ambulatory Visit: Payer: Self-pay

## 2018-06-08 ENCOUNTER — Inpatient Hospital Stay (HOSPITAL_BASED_OUTPATIENT_CLINIC_OR_DEPARTMENT_OTHER): Payer: 59 | Admitting: Hematology & Oncology

## 2018-06-08 VITALS — BP 123/78 | HR 69 | Temp 98.3°F | Resp 20 | Wt 181.5 lb

## 2018-06-08 DIAGNOSIS — D509 Iron deficiency anemia, unspecified: Secondary | ICD-10-CM

## 2018-06-08 DIAGNOSIS — D500 Iron deficiency anemia secondary to blood loss (chronic): Secondary | ICD-10-CM

## 2018-06-08 DIAGNOSIS — C9000 Multiple myeloma not having achieved remission: Secondary | ICD-10-CM | POA: Diagnosis not present

## 2018-06-08 DIAGNOSIS — Z79899 Other long term (current) drug therapy: Secondary | ICD-10-CM | POA: Diagnosis not present

## 2018-06-08 DIAGNOSIS — Z9484 Stem cells transplant status: Secondary | ICD-10-CM | POA: Diagnosis not present

## 2018-06-08 LAB — CMP (CANCER CENTER ONLY)
ALK PHOS: 50 U/L (ref 26–84)
ALT: 45 U/L (ref 10–47)
ANION GAP: 6 (ref 5–15)
AST: 44 U/L — ABNORMAL HIGH (ref 11–38)
Albumin: 4.2 g/dL (ref 3.5–5.0)
BILIRUBIN TOTAL: 0.8 mg/dL (ref 0.2–1.6)
BUN: 18 mg/dL (ref 7–22)
CHLORIDE: 107 mmol/L (ref 98–108)
CO2: 28 mmol/L (ref 18–33)
Calcium: 9.4 mg/dL (ref 8.0–10.3)
Creatinine: 1 mg/dL (ref 0.60–1.20)
Glucose, Bld: 123 mg/dL — ABNORMAL HIGH (ref 73–118)
POTASSIUM: 5 mmol/L — AB (ref 3.3–4.7)
Sodium: 141 mmol/L (ref 128–145)
Total Protein: 7.5 g/dL (ref 6.4–8.1)

## 2018-06-08 LAB — RETICULOCYTES
Immature Retic Fract: 24 % — ABNORMAL HIGH (ref 2.3–15.9)
RBC.: 4.2 MIL/uL — ABNORMAL LOW (ref 4.22–5.81)
RETIC CT PCT: 2.7 % (ref 0.4–3.1)
Retic Count, Absolute: 114.2 10*3/uL (ref 19.0–186.0)

## 2018-06-08 LAB — CBC WITH DIFFERENTIAL (CANCER CENTER ONLY)
ABS IMMATURE GRANULOCYTES: 0.01 10*3/uL (ref 0.00–0.07)
Basophils Absolute: 0.1 10*3/uL (ref 0.0–0.1)
Basophils Relative: 2 %
Eosinophils Absolute: 0.1 10*3/uL (ref 0.0–0.5)
Eosinophils Relative: 2 %
HEMATOCRIT: 41.9 % (ref 39.0–52.0)
Hemoglobin: 12.7 g/dL — ABNORMAL LOW (ref 13.0–17.0)
Immature Granulocytes: 0 %
Lymphocytes Relative: 37 %
Lymphs Abs: 1.3 10*3/uL (ref 0.7–4.0)
MCH: 30.2 pg (ref 26.0–34.0)
MCHC: 30.3 g/dL (ref 30.0–36.0)
MCV: 99.8 fL (ref 80.0–100.0)
MONO ABS: 0.5 10*3/uL (ref 0.1–1.0)
MONOS PCT: 14 %
NEUTROS ABS: 1.6 10*3/uL — AB (ref 1.7–7.7)
Neutrophils Relative %: 45 %
Platelet Count: 223 10*3/uL (ref 150–400)
RBC: 4.2 MIL/uL — AB (ref 4.22–5.81)
RDW: 20 % — AB (ref 11.5–15.5)
WBC: 3.5 10*3/uL — AB (ref 4.0–10.5)
nRBC: 0 % (ref 0.0–0.2)

## 2018-06-08 LAB — FERRITIN: FERRITIN: 190 ng/mL (ref 24–336)

## 2018-06-08 LAB — IRON AND TIBC
IRON: 85 ug/dL (ref 42–163)
Saturation Ratios: 18 % — ABNORMAL LOW (ref 20–55)
TIBC: 464 ug/dL — AB (ref 202–409)
UIBC: 379 ug/dL — AB (ref 117–376)

## 2018-06-08 NOTE — Telephone Encounter (Signed)
Received notification of [facility] appt made for patient to be seen by Dr. Excell Seltzer on 06/24/18 arrival at 11:00 am and appt time at 11:15 am;  Was told that [facility] would notify the patient of the appt date/time;

## 2018-06-08 NOTE — Progress Notes (Signed)
Hematology and Oncology Follow Up Visit  Ward Boissonneault [id number] [birth year] 62 y.o. 06/08/2018   Principle Diagnosis:  IgG Kappa myeloma - Hyperdiploid/+11 DVT of the LEFT and RIGHT leg  Iron deficiency anemia  Current Therapy:   Revlimid 36m po q day (21 on/7 off) - start on 04/07/2018   Zometa 4 mg IV q 3 months- next dose is 08/2018 Xarelto 10 mg PO daily IV Iron as needed   Interim History:  Mr. PCassisis here today with his wife for follow-up.  Thankfully, he is doing a lot better.  Last time we saw him, he was having some GI blood loss.  His hemoglobin was down to 7.9.  He had an upper endoscopy done.  He is found to have significant gastritis.  He had a large hiatal hernia.  He has been referred to surgery for repair of the hiatal hernia.  He got IV iron.  The iron has worked incredibly well.  His hemoglobin is now up to 12.7.  More importantly, is that he and his wife are now new grandparents.  There are daughter had a baby girl last week.  I am so happy for them.  He is doing well on the 5 mg of Revlimid.  He does have some occasional muscle cramps.  His last myeloma studies 3 weeks ago showed no monoclonal spike in his blood.  His IgG level was 827 mg/dL.  His Kappa light chain was 3.4 mg/dL.  Currently, his performance status is ECOG 1.   Medications:  Allergies as of 06/08/2018   No Known Allergies     Medication List        Accurate as of 06/08/18  8:54 AM. Always use your most recent med list.          acetaminophen 325 MG tablet Commonly known as:  TYLENOL Take 2 tablets (650 mg total) by mouth every 6 (six) hours as needed for mild pain (or Fever >/= 101).   albuterol 108 (90 Base) MCG/ACT inhaler Commonly known as:  PROVENTIL HFA;VENTOLIN HFA Inhale 2 puffs into the lungs every 6 (six) hours as needed for wheezing or shortness of breath.   amoxicillin-clavulanate 875-125 MG tablet Commonly known as:  AUGMENTIN Take 1 tablet by mouth 2 (two)  times daily.   benzonatate 200 MG capsule Commonly known as:  TESSALON Take 1 capsule (200 mg total) by mouth 3 (three) times daily as needed for cough.   cetirizine 10 MG tablet Commonly known as:  ZYRTEC Take 10 mg by mouth daily as needed for allergies.   cholecalciferol 1000 units tablet Commonly known as:  VITAMIN D Take 1,000 Units by mouth daily.   famciclovir 500 MG tablet Commonly known as:  FAMVIR TAKE 1 TABLET BY MOUTH  DAILY   fluticasone 50 MCG/ACT nasal spray Commonly known as:  FLONASE SPRAY 2 SPRAYS INTO EACH NOSTRIL EVERY DAY   lenalidomide 5 MG capsule Commonly known as:  REVLIMID TAKE 1 CAPSULE BY MOUTH EVERY DAY ON DAYS 1-21, THEN 7 DAYS OFF. REPEAT EVERY 28 DAYS [id number]   lidocaine 5 % Commonly known as:  LIDODERM PLAEC 1 PATCH ONTO THE SKIN DAILY as needed for pain. APPLY 1 PATCH TO THE MOST PAINFUL AREA FOR 12 HR IN A 24 HR PERIOD   magnesium oxide 400 MG tablet Commonly known as:  MAG-OX Take 400 mg by mouth daily.   ondansetron 8 MG disintegrating tablet Commonly known as:  ZOFRAN-ODT Take 1 tablet (8 mg total) by  mouth every 8 (eight) hours as needed.   OVER THE COUNTER MEDICATION Take 1 tablet by mouth daily.   pantoprazole 20 MG tablet Commonly known as:  PROTONIX Take 1 tablet (20 mg total) by mouth 2 (two) times daily.   polyethylene glycol powder powder Commonly known as:  GLYCOLAX/MIRALAX Take 17 g by mouth daily.   PROBIOTIC PO Take 1 capsule by mouth daily.   rOPINIRole 0.25 MG tablet Commonly known as:  REQUIP TAKE 1 TABLET BY MOUTH 1 TO 3 HOURS BEFORE BED FOR 2 DAYS THEN INCREASE TO 2 TABS   sertraline 100 MG tablet Commonly known as:  ZOLOFT Take 100 mg by mouth at bedtime.   XARELTO 10 MG Tabs tablet Generic drug:  rivaroxaban Take 1 tablet (10 mg total) by mouth daily.   zolpidem 10 MG tablet Commonly known as:  AMBIEN Take 1 tablet (10 mg total) by mouth at bedtime as needed.   ZOMETA IV Inject 4 mg into  the vein every 3 (three) months. Receives at [name] office.       Allergies: No Known Allergies  Past Medical History, Surgical history, Social history, and Family History were reviewed and updated.  Review of Systems: Review of Systems  Constitutional: Negative.   HENT: Negative.   Eyes: Negative.   Respiratory: Negative.   Cardiovascular: Negative.   Gastrointestinal: Negative.   Genitourinary: Negative.   Musculoskeletal: Positive for myalgias.  Skin: Negative.   Neurological: Negative.   Endo/Heme/Allergies: Negative.   Psychiatric/Behavioral: Negative.      Physical Exam:  weight is 181 lb 8 oz (82.3 kg). His oral temperature is 98.3 F (36.8 C). His blood pressure is 123/78 and his pulse is 69. His respiration is 20 and oxygen saturation is 99%.   Wt Readings from Last 3 Encounters:  06/08/18 181 lb 8 oz (82.3 kg)  05/26/18 185 lb (83.9 kg)  05/18/18 185 lb 2 oz (84 kg)    Physical Exam  Constitutional: He is oriented to person, place, and time.  HENT:  Head: Normocephalic and atraumatic.  Mouth/Throat: Oropharynx is clear and moist.  Eyes: Pupils are equal, round, and reactive to light. EOM are normal.  Neck: Normal range of motion.  Cardiovascular: Normal rate, regular rhythm and normal heart sounds.  Pulmonary/Chest: Effort normal and breath sounds normal.  Abdominal: Soft. Bowel sounds are normal.  Musculoskeletal: Normal range of motion. He exhibits no edema, tenderness or deformity.  Lymphadenopathy:    He has no cervical adenopathy.  Neurological: He is alert and oriented to person, place, and time.  Skin: Skin is warm and dry. No rash noted. No erythema.  Psychiatric: He has a normal mood and affect. His behavior is normal. Judgment and thought content normal.  Vitals reviewed.    Lab Results  Component Value Date   WBC 3.5 (L) 06/08/2018   HGB 12.7 (L) 06/08/2018   HCT 41.9 06/08/2018   MCV 99.8 06/08/2018   PLT 223 06/08/2018    Lab Results  Component Value Date   FERRITIN 21 (L) 05/18/2018   IRON 17 (L) 05/18/2018   TIBC 455 (H) 05/18/2018   UIBC 438 (H) 05/18/2018   IRONPCTSAT 4 (L) 05/18/2018   Lab Results  Component Value Date   RETICCTPCT 2.7 06/08/2018   RBC 4.20 (L) 06/08/2018   RBC 4.20 (L) 06/08/2018   Lab Results  Component Value Date   KPAFRELGTCHN 33.6 (H) 05/18/2018   LAMBDASER 15.0 05/18/2018   KAPLAMBRATIO 2.24 (H) 05/18/2018  Lab Results  Component Value Date   FXOVANVB 166 05/18/2018   IGA 210 05/18/2018   IGMSERUM 34 05/18/2018   Lab Results  Component Value Date   TOTALPROTELP 6.3 05/18/2018   ALBUMINELP 3.7 05/18/2018   A1GS 0.3 05/18/2018   A2GS 0.6 05/18/2018   BETS 1.0 05/18/2018   GAMS 0.8 05/18/2018   MSPIKE Not Observed 05/18/2018   SPEI Comment 01/05/2018     Chemistry      Component Value Date/Time   NA 141 06/08/2018 0821   NA 143 06/30/2017 1049   NA 140 10/03/2016 0845   K 5.0 (H) 06/08/2018 0821   K 4.0 06/30/2017 1049   K 4.3 10/03/2016 0845   CL 107 06/08/2018 0821   CL 105 06/30/2017 1049   CO2 28 06/08/2018 0821   CO2 27 06/30/2017 1049   CO2 26 10/03/2016 0845   BUN 18 06/08/2018 0821   BUN 13 06/30/2017 1049   BUN 11.9 10/03/2016 0845   CREATININE 1.00 06/08/2018 0821   CREATININE 1.0 06/30/2017 1049   CREATININE 0.9 10/03/2016 0845      Component Value Date/Time   CALCIUM 9.4 06/08/2018 0821   CALCIUM 9.0 06/30/2017 1049   CALCIUM 9.9 10/03/2016 0845   ALKPHOS 50 06/08/2018 0821   ALKPHOS 57 06/30/2017 1049   ALKPHOS 80 10/03/2016 0845   AST 44 (H) 06/08/2018 0821   AST 28 10/03/2016 0845   ALT 45 06/08/2018 0821   ALT 43 06/30/2017 1049   ALT 26 10/03/2016 0845   BILITOT 0.8 06/08/2018 0821   BILITOT 0.48 10/03/2016 0845      Impression and Plan: Mr. Cornelio is a pleasant 62 yo gentleman with IgG kappa myeloma.  He ultimately underwent a autologous stem cell transplant at [facility] in February 2018.   I am so happy that his  anemia has resolved.  I am even happy that reason for this was found.  I do not see a problem with him having surgery.  I know he is on Xarelto.  I do not think this will be a problem from my perspective.  We will plan to get him back to see [place] in another 6 weeks.  We will get him through the holidays now.  I think this would be reasonable.  He is not due for Zometa until February.   [name], MD 11/26/20198:54 AM

## 2018-06-09 ENCOUNTER — Encounter (HOSPITAL_COMMUNITY): Payer: Self-pay

## 2018-06-09 ENCOUNTER — Ambulatory Visit (HOSPITAL_COMMUNITY)
Admission: RE | Admit: 2018-06-09 | Discharge: 2018-06-09 | Disposition: A | Discharge status: Home / Self Care | Payer: 59 | Source: Ambulatory Visit | Attending: Family | Admitting: Family

## 2018-06-09 ENCOUNTER — Telehealth: Payer: Self-pay | Admitting: Hematology & Oncology

## 2018-06-09 DIAGNOSIS — C9000 Multiple myeloma not having achieved remission: Secondary | ICD-10-CM | POA: Insufficient documentation

## 2018-06-09 LAB — KAPPA/LAMBDA LIGHT CHAINS
KAPPA FREE LGHT CHN: 28.5 mg/L — AB (ref 3.3–19.4)
KAPPA, LAMDA LIGHT CHAIN RATIO: 2.13 — AB (ref 0.26–1.65)
LAMDA FREE LIGHT CHAINS: 13.4 mg/L (ref 5.7–26.3)

## 2018-06-09 LAB — GLUCOSE, CAPILLARY: GLUCOSE-CAPILLARY: 84 mg/dL (ref 70–99)

## 2018-06-09 MED ORDER — FLUDEOXYGLUCOSE F - 18 (FDG) INJECTION
9.0300 | Freq: Once | INTRAVENOUS | Status: AC | PRN
  Administered 2018-06-09: 9.03 via INTRAVENOUS

## 2018-06-09 NOTE — Telephone Encounter (Signed)
Appointment scheduled patient has been notified per 11/27 staff message

## 2018-06-11 ENCOUNTER — Encounter: Payer: Self-pay | Admitting: *Deleted

## 2018-06-14 LAB — MULTIPLE MYELOMA PANEL, SERUM
ALBUMIN SERPL ELPH-MCNC: 4.1 g/dL (ref 2.9–4.4)
ALBUMIN/GLOB SERPL: 1.4 (ref 0.7–1.7)
Alpha 1: 0.3 g/dL (ref 0.0–0.4)
Alpha2 Glob SerPl Elph-Mcnc: 0.6 g/dL (ref 0.4–1.0)
B-Globulin SerPl Elph-Mcnc: 1.2 g/dL (ref 0.7–1.3)
GAMMA GLOB SERPL ELPH-MCNC: 1 g/dL (ref 0.4–1.8)
GLOBULIN, TOTAL: 3 g/dL (ref 2.2–3.9)
IGA: 277 mg/dL (ref 61–437)
IgG (Immunoglobin G), Serum: 1114 mg/dL (ref 700–1600)
IgM (Immunoglobulin M), Srm: 35 mg/dL (ref 20–172)
Total Protein ELP: 7.1 g/dL (ref 6.0–8.5)

## 2018-06-15 ENCOUNTER — Encounter: Payer: Self-pay | Admitting: *Deleted

## 2018-06-16 ENCOUNTER — Inpatient Hospital Stay: Payer: 59 | Attending: Hematology & Oncology

## 2018-06-16 ENCOUNTER — Telehealth: Payer: Self-pay | Admitting: General Practice

## 2018-06-16 ENCOUNTER — Other Ambulatory Visit: Payer: Self-pay

## 2018-06-16 VITALS — BP 118/76 | HR 60 | Temp 97.6°F | Resp 18

## 2018-06-16 DIAGNOSIS — C9000 Multiple myeloma not having achieved remission: Secondary | ICD-10-CM | POA: Insufficient documentation

## 2018-06-16 DIAGNOSIS — D509 Iron deficiency anemia, unspecified: Secondary | ICD-10-CM | POA: Insufficient documentation

## 2018-06-16 DIAGNOSIS — Z79899 Other long term (current) drug therapy: Secondary | ICD-10-CM | POA: Insufficient documentation

## 2018-06-16 MED ORDER — SODIUM CHLORIDE 0.9 % IV SOLN
Freq: Once | INTRAVENOUS | Status: AC
  Administered 2018-06-16: 08:00:00 via INTRAVENOUS
  Filled 2018-06-16: qty 250

## 2018-06-16 MED ORDER — SODIUM CHLORIDE 0.9 % IV SOLN
510.0000 mg | Freq: Once | INTRAVENOUS | Status: AC
  Administered 2018-06-16: 510 mg via INTRAVENOUS
  Filled 2018-06-16: qty 17

## 2018-06-16 NOTE — Patient Instructions (Signed)

## 2018-06-16 NOTE — Telephone Encounter (Signed)
[place] CSW Progress Notes  Request from [name] to speak w patient regarding insurance questions as they are choosing insurance for the upcoming year.  As these are specific questions related to coverage and cost, CSW referred patient to two resources - Therapist, art which has significant amount of information related to choosing insurance while dealing w cancer and Tax adviser which has a connection with the [facility] program.  Edwyna Shell, [place] Worker Phone:  [phone]

## 2018-06-16 NOTE — Progress Notes (Signed)
Patient refused to stay for 30 minute post Iron infusion observation. VSS. Patient discharged ambulatory without complaints or concerns.

## 2018-06-21 ENCOUNTER — Other Ambulatory Visit: Payer: Self-pay | Admitting: *Deleted

## 2018-06-21 ENCOUNTER — Other Ambulatory Visit: Payer: Self-pay | Admitting: Hematology & Oncology

## 2018-06-21 DIAGNOSIS — C9000 Multiple myeloma not having achieved remission: Secondary | ICD-10-CM

## 2018-06-21 MED ORDER — LENALIDOMIDE 5 MG PO CAPS: ORAL_CAPSULE | ORAL | 0 refills | Status: DC

## 2018-06-23 DIAGNOSIS — H5213 Myopia, bilateral: Secondary | ICD-10-CM | POA: Diagnosis not present

## 2018-06-24 ENCOUNTER — Other Ambulatory Visit: Payer: Self-pay | Admitting: Hematology & Oncology

## 2018-06-24 ENCOUNTER — Ambulatory Visit: Payer: Self-pay | Admitting: General Surgery

## 2018-06-24 DIAGNOSIS — K449 Diaphragmatic hernia without obstruction or gangrene: Secondary | ICD-10-CM | POA: Diagnosis not present

## 2018-06-24 DIAGNOSIS — C9000 Multiple myeloma not having achieved remission: Secondary | ICD-10-CM

## 2018-06-24 DIAGNOSIS — G8929 Other chronic pain: Secondary | ICD-10-CM

## 2018-06-24 DIAGNOSIS — M545 Low back pain: Secondary | ICD-10-CM

## 2018-06-24 DIAGNOSIS — D472 Monoclonal gammopathy: Secondary | ICD-10-CM

## 2018-07-12 ENCOUNTER — Other Ambulatory Visit: Payer: Self-pay | Admitting: Hematology & Oncology

## 2018-07-12 DIAGNOSIS — C9000 Multiple myeloma not having achieved remission: Secondary | ICD-10-CM

## 2018-07-20 ENCOUNTER — Inpatient Hospital Stay: Payer: 59

## 2018-07-20 ENCOUNTER — Encounter: Payer: Self-pay | Admitting: Hematology & Oncology

## 2018-07-20 ENCOUNTER — Inpatient Hospital Stay: Payer: 59 | Attending: Hematology & Oncology | Admitting: Hematology & Oncology

## 2018-07-20 ENCOUNTER — Other Ambulatory Visit: Payer: Self-pay

## 2018-07-20 ENCOUNTER — Ambulatory Visit: Payer: 59

## 2018-07-20 VITALS — BP 127/86 | HR 67 | Temp 98.1°F | Resp 16 | Wt 186.0 lb

## 2018-07-20 DIAGNOSIS — D500 Iron deficiency anemia secondary to blood loss (chronic): Secondary | ICD-10-CM

## 2018-07-20 DIAGNOSIS — Z7901 Long term (current) use of anticoagulants: Secondary | ICD-10-CM | POA: Insufficient documentation

## 2018-07-20 DIAGNOSIS — C9000 Multiple myeloma not having achieved remission: Secondary | ICD-10-CM

## 2018-07-20 DIAGNOSIS — K449 Diaphragmatic hernia without obstruction or gangrene: Secondary | ICD-10-CM | POA: Diagnosis not present

## 2018-07-20 DIAGNOSIS — Z79899 Other long term (current) drug therapy: Secondary | ICD-10-CM | POA: Insufficient documentation

## 2018-07-20 DIAGNOSIS — D509 Iron deficiency anemia, unspecified: Secondary | ICD-10-CM

## 2018-07-20 LAB — IRON AND TIBC
Iron: 69 ug/dL (ref 42–163)
Saturation Ratios: 17 % — ABNORMAL LOW (ref 20–55)
TIBC: 399 ug/dL (ref 202–409)
UIBC: 330 ug/dL (ref 117–376)

## 2018-07-20 LAB — CMP (CANCER CENTER ONLY)
ALT: 53 U/L — ABNORMAL HIGH (ref 0–44)
AST: 39 U/L (ref 15–41)
Albumin: 4.5 g/dL (ref 3.5–5.0)
Alkaline Phosphatase: 55 U/L (ref 38–126)
Anion gap: 8 (ref 5–15)
BUN: 16 mg/dL (ref 8–23)
CALCIUM: 9.1 mg/dL (ref 8.9–10.3)
CO2: 28 mmol/L (ref 22–32)
Chloride: 102 mmol/L (ref 98–111)
Creatinine: 0.95 mg/dL (ref 0.61–1.24)
GFR, Est AFR Am: >60 mL/min (ref >60)
GFR, Estimated: >60 mL/min (ref >60)
Glucose, Bld: 111 mg/dL — ABNORMAL HIGH (ref 70–99)
Potassium: 4.2 mmol/L (ref 3.5–5.1)
Sodium: 138 mmol/L (ref 135–145)
Total Bilirubin: 0.3 mg/dL (ref 0.3–1.2)
Total Protein: 7.2 g/dL (ref 6.5–8.1)

## 2018-07-20 LAB — CBC WITH DIFFERENTIAL (CANCER CENTER ONLY)
Abs Immature Granulocytes: 0.02 10*3/uL (ref 0.00–0.07)
Basophils Absolute: 0.1 10*3/uL (ref 0.0–0.1)
Basophils Relative: 1 %
Eosinophils Absolute: 0.2 10*3/uL (ref 0.0–0.5)
Eosinophils Relative: 5 %
HCT: 45.5 % (ref 39.0–52.0)
HEMOGLOBIN: 14.6 g/dL (ref 13.0–17.0)
Immature Granulocytes: 1 %
LYMPHS ABS: 1.3 10*3/uL (ref 0.7–4.0)
LYMPHS PCT: 38 %
MCH: 31.1 pg (ref 26.0–34.0)
MCHC: 32.1 g/dL (ref 30.0–36.0)
MCV: 97 fL (ref 80.0–100.0)
MONOS PCT: 17 %
Monocytes Absolute: 0.6 10*3/uL (ref 0.1–1.0)
Neutro Abs: 1.3 10*3/uL — ABNORMAL LOW (ref 1.7–7.7)
Neutrophils Relative %: 38 %
Platelet Count: 155 10*3/uL (ref 150–400)
RBC: 4.69 MIL/uL (ref 4.22–5.81)
RDW: 18.3 % — ABNORMAL HIGH (ref 11.5–15.5)
WBC Count: 3.5 10*3/uL — ABNORMAL LOW (ref 4.0–10.5)
nRBC: 0 % (ref 0.0–0.2)

## 2018-07-20 LAB — FERRITIN: Ferritin: 54 ng/mL (ref 24–336)

## 2018-07-20 NOTE — Progress Notes (Signed)
Hematology and Oncology Follow Up Visit  Jorge [name] [id number] 1956-07-11 63 y.o. 07/20/2018   Principle Diagnosis:  IgG Kappa myeloma - Hyperdiploid/+11 DVT of the LEFT and RIGHT leg  Iron deficiency anemia  Current Therapy:   Revlimid 64m po q day (21 on/7 off) - start on 04/07/2018   Zometa 4 mg IV q 3 months- next dose is 08/2018 Xarelto 10 mg PO daily IV Iron as needed   Interim History:  Jorge [name] here today with his wife for follow-up.  He is doing a lot better.  He feels good.  The iron that we gave him clearly helped.  He is set up for his hiatal hernia surgery in January on January 31.  He actually has a job now.  He is working as a rWellsite geologistfor an [employer]  He has had no problems with bleeding.  There is been no melena or bright red blood per rectum.  His last iron studies look better.  We saw him back in November, his ferritin was 190 with an iron saturation of 18%.  We will see what his iron levels are today.  If still on the low side, I will give him another dose of IV iron.  He and his wife did have a very nice Christmas.  [place]    Currently, his performance status is ECOG 1.   Medications:  Allergies as of 07/20/2018   No Known Allergies     Medication List       Accurate as of July 20, 2018  8:58 AM. Always use your most recent med list.        acetaminophen 325 MG tablet Commonly known as:  TYLENOL Take 2 tablets (650 mg total) by mouth every 6 (six) hours as needed for mild pain (or Fever >/= 101).   albuterol 108 (90 Base) MCG/ACT inhaler Commonly known as:  PROVENTIL HFA;VENTOLIN HFA Inhale 2 puffs into the lungs every 6 (six) hours as needed for wheezing or shortness of breath.   amoxicillin-clavulanate 875-125 MG tablet Commonly known as:  AUGMENTIN Take 1 tablet by  mouth 2 (two) times daily.   benzonatate 200 MG capsule Commonly known as:  TESSALON Take 1 capsule (200 mg total) by mouth 3 (three) times daily as needed for cough.   cetirizine 10 MG tablet Commonly known as:  ZYRTEC Take 10 mg by mouth daily as needed for allergies.   cholecalciferol 1000 units tablet Commonly known as:  VITAMIN D Take 1,000 Units by mouth daily.   famciclovir 500 MG tablet Commonly known as:  FAMVIR TAKE 1 TABLET BY MOUTH  DAILY   fluticasone 50 MCG/ACT nasal spray Commonly known as:  FLONASE SPRAY 2 SPRAYS INTO EACH NOSTRIL EVERY DAY   lenalidomide 5 MG capsule Commonly known as:  REVLIMID TAKE 1 CAPSULE BY MOUTH  EVERY DAY ON DAYS 1-21,  THEN 7 DAYS OFF. REPEAT  EVERY 28 DAYS   lidocaine 5 % Commonly known as:  LIDODERM PLAEC 1 PATCH ONTO THE SKIN DAILY as needed for pain. APPLY 1 PATCH TO THE MOST PAINFUL AREA FOR 12 HR IN A 24 HR PERIOD   magnesium oxide 400 MG tablet Commonly known as:  MAG-OX Take 400 mg by mouth daily.   ondansetron 8 MG disintegrating tablet Commonly known as:  ZOFRAN-ODT  Take 1 tablet (8 mg total) by mouth every 8 (eight) hours as needed.   OVER THE COUNTER MEDICATION Take 1 tablet by mouth daily.   pantoprazole 20 MG tablet Commonly known as:  PROTONIX Take 1 tablet (20 mg total) by mouth 2 (two) times daily.   polyethylene glycol powder powder Commonly known as:  MIRALAX Take 17 g by mouth daily.   PROBIOTIC PO Take 1 capsule by mouth daily.   rOPINIRole 0.25 MG tablet Commonly known as:  REQUIP TAKE 1 TABLET BY MOUTH 1 TO 3 HOURS BEFORE BED FOR 2 DAYS THEN INCREASE TO 2 TABS   sertraline 100 MG tablet Commonly known as:  ZOLOFT Take 100 mg by mouth at bedtime.   XARELTO 10 MG Tabs tablet Generic drug:  rivaroxaban Take 1 tablet (10 mg total) by mouth daily.   zolpidem 10 MG tablet Commonly known as:  AMBIEN Take 1 tablet (10 mg total) by mouth at bedtime as needed.   ZOMETA IV Inject 4 mg into the  vein every 3 (three) months. Receives at [name] office.       Allergies: No Known Allergies  Past Medical History, Surgical history, Social history, and Family History were reviewed and updated.  Review of Systems: Review of Systems  Constitutional: Negative.   HENT: Negative.   Eyes: Negative.   Respiratory: Negative.   Cardiovascular: Negative.   Gastrointestinal: Negative.   Genitourinary: Negative.   Musculoskeletal: Positive for myalgias.  Skin: Negative.   Neurological: Negative.   Endo/Heme/Allergies: Negative.   Psychiatric/Behavioral: Negative.      Physical Exam:  weight is 186 lb (84.4 kg). His oral temperature is 98.1 F (36.7 C). His blood pressure is 127/86 and his pulse is 67. His respiration is 16 and oxygen saturation is 99%.   Wt Readings from Last 3 Encounters:  07/20/18 186 lb (84.4 kg)  06/08/18 181 lb 8 oz (82.3 kg)  05/26/18 185 lb (83.9 kg)    Physical Exam Vitals signs reviewed.  HENT:     Head: Normocephalic and atraumatic.  Eyes:     Pupils: Pupils are equal, round, and reactive to light.  Neck:     Musculoskeletal: Normal range of motion.  Cardiovascular:     Rate and Rhythm: Normal rate and regular rhythm.     Heart sounds: Normal heart sounds.  Pulmonary:     Effort: Pulmonary effort is normal.     Breath sounds: Normal breath sounds.  Abdominal:     General: Bowel sounds are normal.     Palpations: Abdomen is soft.  Musculoskeletal: Normal range of motion.        General: No tenderness or deformity.  Lymphadenopathy:     Cervical: No cervical adenopathy.  Skin:    General: Skin is warm and dry.     Findings: No erythema or rash.  Neurological:     Mental Status: He is alert and oriented to person, place, and time.  Psychiatric:        Behavior: Behavior normal.        Thought Content: Thought content normal.        Judgment: Judgment normal.      Lab Results  Component Value Date   WBC 3.5 (L) 07/20/2018    HGB 14.6 07/20/2018   HCT 45.5 07/20/2018   MCV 97.0 07/20/2018   PLT 155 07/20/2018   Lab Results  Component Value Date   FERRITIN 190 06/08/2018   IRON 85 06/08/2018  TIBC 464 (H) 06/08/2018   UIBC 379 (H) 06/08/2018   IRONPCTSAT 18 (L) 06/08/2018   Lab Results  Component Value Date   RETICCTPCT 2.7 06/08/2018   RBC 4.69 07/20/2018   Lab Results  Component Value Date   KPAFRELGTCHN 28.5 (H) 06/08/2018   LAMBDASER 13.4 06/08/2018   KAPLAMBRATIO 2.13 (H) 06/08/2018   Lab Results  Component Value Date   IGGSERUM 1,114 06/08/2018   IGA 277 06/08/2018   IGMSERUM 35 06/08/2018   Lab Results  Component Value Date   TOTALPROTELP 7.1 06/08/2018   ALBUMINELP 3.7 05/18/2018   A1GS 0.3 05/18/2018   A2GS 0.6 05/18/2018   BETS 1.0 05/18/2018   GAMS 0.8 05/18/2018   MSPIKE Not Observed 05/18/2018   SPEI Comment 01/05/2018     Chemistry      Component Value Date/Time   NA 138 07/20/2018 0819   NA 143 06/30/2017 1049   NA 140 10/03/2016 0845   K 4.2 07/20/2018 0819   K 4.0 06/30/2017 1049   K 4.3 10/03/2016 0845   CL 102 07/20/2018 0819   CL 105 06/30/2017 1049   CO2 28 07/20/2018 0819   CO2 27 06/30/2017 1049   CO2 26 10/03/2016 0845   BUN 16 07/20/2018 0819   BUN 13 06/30/2017 1049   BUN 11.9 10/03/2016 0845   CREATININE 0.95 07/20/2018 0819   CREATININE 1.0 06/30/2017 1049   CREATININE 0.9 10/03/2016 0845      Component Value Date/Time   CALCIUM 9.1 07/20/2018 0819   CALCIUM 9.0 06/30/2017 1049   CALCIUM 9.9 10/03/2016 0845   ALKPHOS 55 07/20/2018 0819   ALKPHOS 57 06/30/2017 1049   ALKPHOS 80 10/03/2016 0845   AST 39 07/20/2018 0819   AST 28 10/03/2016 0845   ALT 53 (H) 07/20/2018 0819   ALT 43 06/30/2017 1049   ALT 26 10/03/2016 0845   BILITOT 0.3 07/20/2018 0819   BILITOT 0.48 10/03/2016 0845      Impression and Plan: Jorge [name] is a pleasant 63 yo gentleman with IgG kappa myeloma.  He ultimately underwent a autologous stem cell transplant at  [facility] in February 2018.   I am so happy that his anemia has resolved.   As far as him having his surgery on January 31, I told him to stop his Xarelto 3 days before surgery.  He understands this.  He will stop his Revlimid 7 days before surgery.  He will restart both the Revlimid and Xarelto a day or so after surgery.  I will see him back in early February.   [name], MD 1/7/20208:58 AM

## 2018-07-21 ENCOUNTER — Telehealth: Payer: Self-pay | Admitting: Hematology & Oncology

## 2018-07-21 LAB — IGG, IGA, IGM
IgA: 282 mg/dL (ref 61–437)
IgG (Immunoglobin G), Serum: 1063 mg/dL (ref 700–1600)
IgM (Immunoglobulin M), Srm: 32 mg/dL (ref 20–172)

## 2018-07-21 LAB — PROTEIN ELECTROPHORESIS, SERUM, WITH REFLEX
A/G Ratio: 1.4 (ref 0.7–1.7)
ALPHA-1-GLOBULIN: 0.2 g/dL (ref 0.0–0.4)
Albumin ELP: 4.1 g/dL (ref 2.9–4.4)
Alpha-2-Globulin: 0.6 g/dL (ref 0.4–1.0)
Beta Globulin: 1 g/dL (ref 0.7–1.3)
Gamma Globulin: 1 g/dL (ref 0.4–1.8)
Globulin, Total: 2.9 g/dL (ref 2.2–3.9)
TOTAL PROTEIN ELP: 7 g/dL (ref 6.0–8.5)

## 2018-07-21 LAB — KAPPA/LAMBDA LIGHT CHAINS
Kappa free light chain: 29.7 mg/L — ABNORMAL HIGH (ref 3.3–19.4)
Kappa, lambda light chain ratio: 2.06 — ABNORMAL HIGH (ref 0.26–1.65)
Lambda free light chains: 14.4 mg/L (ref 5.7–26.3)

## 2018-07-21 NOTE — Telephone Encounter (Signed)
Appointment scheduled patient notified per 1/7 staff message

## 2018-07-27 ENCOUNTER — Other Ambulatory Visit: Payer: Self-pay | Admitting: *Deleted

## 2018-07-27 DIAGNOSIS — C9000 Multiple myeloma not having achieved remission: Secondary | ICD-10-CM

## 2018-07-27 MED ORDER — LENALIDOMIDE 5 MG PO CAPS: ORAL_CAPSULE | ORAL | 0 refills | Status: DC

## 2018-07-28 ENCOUNTER — Inpatient Hospital Stay: Payer: 59

## 2018-07-28 VITALS — BP 136/93 | HR 66 | Temp 98.1°F | Resp 18

## 2018-07-28 DIAGNOSIS — C9000 Multiple myeloma not having achieved remission: Secondary | ICD-10-CM | POA: Diagnosis not present

## 2018-07-28 MED ORDER — SODIUM CHLORIDE 0.9 % IV SOLN
INTRAVENOUS | Status: DC
  Administered 2018-07-28: 08:00:00 via INTRAVENOUS
  Filled 2018-07-28: qty 250

## 2018-07-28 MED ORDER — SODIUM CHLORIDE 0.9 % IV SOLN
510.0000 mg | Freq: Once | INTRAVENOUS | Status: AC
  Administered 2018-07-28: 510 mg via INTRAVENOUS
  Filled 2018-07-28: qty 17

## 2018-07-28 NOTE — Patient Instructions (Signed)

## 2018-07-30 ENCOUNTER — Encounter: Payer: Self-pay | Admitting: Emergency Medicine

## 2018-07-30 DIAGNOSIS — F419 Anxiety disorder, unspecified: Secondary | ICD-10-CM | POA: Insufficient documentation

## 2018-07-30 DIAGNOSIS — F329 Major depressive disorder, single episode, unspecified: Secondary | ICD-10-CM | POA: Insufficient documentation

## 2018-07-30 DIAGNOSIS — F32A Depression, unspecified: Secondary | ICD-10-CM | POA: Insufficient documentation

## 2018-08-03 ENCOUNTER — Telehealth: Payer: Self-pay | Admitting: Hematology & Oncology

## 2018-08-03 NOTE — Telephone Encounter (Signed)
Returned call to patient and lmom to inform of lab only appt 1/29 at 11 am per voicemail message

## 2018-08-06 NOTE — Patient Instructions (Addendum)
Jorge [name]  08/06/2018   Your procedure is scheduled on: 08-13-18    Report to [facility] Main  Entrance    Report to Admitting at 5:30 AM    Call this number if you have problems the morning of surgery [phone]    Remember: NO SOLID FOOD AFTER MIDNIGHT THE NIGHT PRIOR TO SURGERY. NOTHING BY MOUTH EXCEPT CLEAR LIQUIDS UNTIL 3 HOURS PRIOR TO [name] SURGERY. PLEASE FINISH ENSURE DRINK PER SURGEON ORDER 3 HOURS PRIOR TO SCHEDULED SURGERY TIME WHICH NEEDS TO BE COMPLETED AT 4:30 AM.     CLEAR LIQUID DIET   Foods Allowed                                                                     Foods Excluded  Coffee and tea, regular and decaf                             liquids that you cannot  Plain Jell-O in any flavor                                             see through such as: Fruit ices (not with fruit pulp)                                     milk, soups, orange juice  Iced Popsicles                                    All solid food Carbonated beverages, regular and diet                                    Cranberry, grape and apple juices Sports drinks like Gatorade Lightly seasoned clear broth or consume(fat free) Sugar, honey syrup  Sample Menu Breakfast                                Lunch                                     Supper Cranberry juice                    Beef broth                            Chicken broth Jell-O                                     Grape juice  Apple juice Coffee or tea                        Jell-O                                      Popsicle                                                Coffee or tea                        Coffee or tea  _____________________________________________________________________     Take these medicines the morning of surgery with A SIP OF WATER: You may take your Cetirizine (Zyrtec) and Pantoprazole (Protonix) as needed. You may use and bring your inhaler  and nasal spray as needed.                               You may not have any metal on your body including hair pins and              piercings  Do not wear jewelry, cologne, lotions, powders or deodorant             Men may shave face and neck.   Do not bring valuables to the hospital. [place].  Contacts, dentures or bridgework may not be worn into surgery.      Patients discharged the day of surgery will not be allowed to drive home. IF YOU ARE HAVING SURGERY AND GOING HOME THE SAME DAY, YOU MUST HAVE AN ADULT TO DRIVE YOU HOME AND BE WITH YOU FOR 24 HOURS. YOU MAY GO HOME BY TAXI OR UBER OR ORTHERWISE, BUT AN ADULT MUST ACCOMPANY YOU HOME AND STAY WITH YOU FOR 24 HOURS.    Name and phone number of your driver: Jorge [name] [phone]  Special Instructions:               Please read over the following fact sheets you were given: _____________________________________________________________________             [facility] - Preparing for Surgery Before surgery, you can play an important role.  Because skin is not sterile, your skin needs to be as free of germs as possible.  You can reduce the number of germs on your skin by washing with CHG (chlorahexidine gluconate) soap before surgery.  CHG is an antiseptic cleaner which kills germs and bonds with the skin to continue killing germs even after washing. Please DO NOT use if you have an allergy to CHG or antibacterial soaps.  If your skin becomes reddened/irritated stop using the CHG and inform your nurse when you arrive at Short Stay. Do not shave (including legs and underarms) for at least 48 hours prior to the first CHG shower.  You may shave your face/neck. Please follow these instructions carefully:  1.  Shower with CHG Soap the night before surgery and the  morning of Surgery.  2.  If you choose to wash your hair, wash your hair first as  usual with your  normal  shampoo.  3.  After  you shampoo, rinse your hair and body thoroughly to remove the  shampoo.                           4.  Use CHG as you would any other liquid soap.  You can apply chg directly  to the skin and wash                       Gently with a scrungie or clean washcloth.  5.  Apply the CHG Soap to your body ONLY FROM THE NECK DOWN.   Do not use on face/ open                           Wound or open sores. Avoid contact with eyes, ears mouth and genitals (private parts).                       Wash face,  Genitals (private parts) with your normal soap.             6.  Wash thoroughly, paying special attention to the area where your surgery  will be performed.  7.  Thoroughly rinse your body with warm water from the neck down.  8.  DO NOT shower/wash with your normal soap after using and rinsing off  the CHG Soap.                9.  Pat yourself dry with a clean towel.            10.  Wear clean pajamas.            11.  Place clean sheets on your bed the night of your first shower and do not  sleep with pets. Day of Surgery : Do not apply any lotions/deodorants the morning of surgery.  Please wear clean clothes to the hospital/surgery center.  FAILURE TO FOLLOW THESE INSTRUCTIONS MAY RESULT IN THE CANCELLATION OF YOUR SURGERY PATIENT SIGNATURE_________________________________  NURSE SIGNATURE__________________________________  ________________________________________________________________________

## 2018-08-06 NOTE — Progress Notes (Addendum)
02-01-18 (Epic) EKG  02-02-18 (Epic) CXR  11-11-17 (Epic) ECHO   08-09-18 CBC result routed to Dr. Excell Seltzer for review

## 2018-08-09 ENCOUNTER — Other Ambulatory Visit: Payer: Self-pay

## 2018-08-09 ENCOUNTER — Encounter (HOSPITAL_COMMUNITY): Payer: Self-pay

## 2018-08-09 ENCOUNTER — Encounter (HOSPITAL_COMMUNITY)
Admission: RE | Admit: 2018-08-09 | Discharge: 2018-08-09 | Disposition: A | Discharge status: Home / Self Care | Payer: 59 | Source: Ambulatory Visit | Attending: General Surgery | Admitting: General Surgery

## 2018-08-09 DIAGNOSIS — Z01812 Encounter for preprocedural laboratory examination: Secondary | ICD-10-CM | POA: Diagnosis present

## 2018-08-09 LAB — BASIC METABOLIC PANEL
Anion gap: 5 (ref 5–15)
BUN: 20 mg/dL (ref 8–23)
CO2: 26 mmol/L (ref 22–32)
Calcium: 9.7 mg/dL (ref 8.9–10.3)
Chloride: 110 mmol/L (ref 98–111)
Creatinine, Ser: 0.92 mg/dL (ref 0.61–1.24)
Glucose, Bld: 91 mg/dL (ref 70–99)
Potassium: 4.9 mmol/L (ref 3.5–5.1)
Sodium: 141 mmol/L (ref 135–145)

## 2018-08-09 LAB — CBC
HCT: 46.8 % (ref 39.0–52.0)
Hemoglobin: 15 g/dL (ref 13.0–17.0)
MCH: 31.4 pg (ref 26.0–34.0)
MCHC: 32.1 g/dL (ref 30.0–36.0)
MCV: 97.9 fL (ref 80.0–100.0)
Platelets: 183 10*3/uL (ref 150–400)
RBC: 4.78 MIL/uL (ref 4.22–5.81)
RDW: 18.3 % — ABNORMAL HIGH (ref 11.5–15.5)
WBC: 2.7 10*3/uL — ABNORMAL LOW (ref 4.0–10.5)
nRBC: 0 % (ref 0.0–0.2)

## 2018-08-10 ENCOUNTER — Other Ambulatory Visit: Payer: Self-pay | Admitting: *Deleted

## 2018-08-10 DIAGNOSIS — C9000 Multiple myeloma not having achieved remission: Secondary | ICD-10-CM

## 2018-08-10 DIAGNOSIS — D500 Iron deficiency anemia secondary to blood loss (chronic): Secondary | ICD-10-CM

## 2018-08-11 ENCOUNTER — Inpatient Hospital Stay: Payer: 59

## 2018-08-11 DIAGNOSIS — C9000 Multiple myeloma not having achieved remission: Secondary | ICD-10-CM | POA: Diagnosis not present

## 2018-08-11 DIAGNOSIS — D500 Iron deficiency anemia secondary to blood loss (chronic): Secondary | ICD-10-CM

## 2018-08-11 LAB — CBC WITH DIFFERENTIAL (CANCER CENTER ONLY)
ABS IMMATURE GRANULOCYTES: 0.01 10*3/uL (ref 0.00–0.07)
Basophils Absolute: 0.1 10*3/uL (ref 0.0–0.1)
Basophils Relative: 2 %
Eosinophils Absolute: 0.1 10*3/uL (ref 0.0–0.5)
Eosinophils Relative: 3 %
HCT: 43.9 % (ref 39.0–52.0)
Hemoglobin: 14.7 g/dL (ref 13.0–17.0)
Immature Granulocytes: 0 %
Lymphocytes Relative: 42 %
Lymphs Abs: 1.1 10*3/uL (ref 0.7–4.0)
MCH: 32 pg (ref 26.0–34.0)
MCHC: 33.5 g/dL (ref 30.0–36.0)
MCV: 95.4 fL (ref 80.0–100.0)
Monocytes Absolute: 0.3 10*3/uL (ref 0.1–1.0)
Monocytes Relative: 13 %
Neutro Abs: 1.1 10*3/uL — ABNORMAL LOW (ref 1.7–7.7)
Neutrophils Relative %: 40 %
Platelet Count: 167 10*3/uL (ref 150–400)
RBC: 4.6 MIL/uL (ref 4.22–5.81)
RDW: 17.9 % — ABNORMAL HIGH (ref 11.5–15.5)
WBC Count: 2.7 10*3/uL — ABNORMAL LOW (ref 4.0–10.5)
nRBC: 0 % (ref 0.0–0.2)

## 2018-08-11 LAB — CMP (CANCER CENTER ONLY)
ALT: 42 U/L (ref 0–44)
AST: 33 U/L (ref 15–41)
Albumin: 4.6 g/dL (ref 3.5–5.0)
Alkaline Phosphatase: 48 U/L (ref 38–126)
Anion gap: 8 (ref 5–15)
BILIRUBIN TOTAL: 0.5 mg/dL (ref 0.3–1.2)
BUN: 17 mg/dL (ref 8–23)
CO2: 25 mmol/L (ref 22–32)
Calcium: 9.9 mg/dL (ref 8.9–10.3)
Chloride: 106 mmol/L (ref 98–111)
Creatinine: 0.88 mg/dL (ref 0.61–1.24)
GFR, Est AFR Am: >60 mL/min (ref >60)
GFR, Estimated: >60 mL/min (ref >60)
Glucose, Bld: 100 mg/dL — ABNORMAL HIGH (ref 70–99)
Potassium: 4.3 mmol/L (ref 3.5–5.1)
Sodium: 139 mmol/L (ref 135–145)
TOTAL PROTEIN: 6.9 g/dL (ref 6.5–8.1)

## 2018-08-12 LAB — IRON AND TIBC
IRON: 153 ug/dL (ref 42–163)
Saturation Ratios: 45 % (ref 20–55)
TIBC: 339 ug/dL (ref 202–409)
UIBC: 186 ug/dL (ref 117–376)

## 2018-08-12 LAB — FERRITIN: Ferritin: 224 ng/mL (ref 24–336)

## 2018-08-12 MED ORDER — BUPIVACAINE LIPOSOME 1.3 % IJ SUSP
20.0000 mL | INTRAMUSCULAR | Status: DC
  Filled 2018-08-12: qty 20

## 2018-08-12 NOTE — Anesthesia Preprocedure Evaluation (Addendum)
Anesthesia Evaluation  Patient identified by MRN, date of birth, ID band Patient awake    Reviewed: Allergy & Precautions, NPO status , Patient's Chart, lab work & pertinent test results  Airway Mallampati: II  TM Distance: >3 FB Neck ROM: Full    Dental no notable dental hx. (+) Teeth Intact, Dental Advisory Given   Pulmonary neg pulmonary ROS, former smoker,    Pulmonary exam normal breath sounds clear to auscultation       Cardiovascular + DVT (on xarelto)  Normal cardiovascular exam Rhythm:Regular Rate:Normal  TTE 11/2017 - Normal LV systolic function; mild AI and MR.   Neuro/Psych PSYCHIATRIC DISORDERS Anxiety Depression negative neurological ROS     GI/Hepatic Neg liver ROS, hiatal hernia, GERD  ,  Endo/Other  negative endocrine ROS  Renal/GU negative Renal ROS  negative genitourinary   Musculoskeletal negative musculoskeletal ROS (+)   Abdominal   Peds  Hematology negative hematology ROS (+) Multiple myeloma s/p bone marrow transplant 08/2016   Anesthesia Other Findings   Reproductive/Obstetrics                           Anesthesia Physical Anesthesia Plan  ASA: III  Anesthesia Plan: General   Post-op Pain Management:    Induction: Intravenous  PONV Risk Score and Plan: 2 and Ondansetron, Dexamethasone and Midazolam  Airway Management Planned: Oral ETT  Additional Equipment:   Intra-op Plan:   Post-operative Plan: Extubation in OR  Informed Consent: I have reviewed the patients History and Physical, chart, labs and discussed the procedure including the risks, benefits and alternatives for the proposed anesthesia with the patient or authorized representative who has indicated his/her understanding and acceptance.     Dental advisory given  Plan Discussed with: CRNA  Anesthesia Plan Comments:        Anesthesia Quick Evaluation

## 2018-08-12 NOTE — H&P (Signed)
History of Present Illness  The patient is a 63 year old male who presents with a hiatal hernia. Patient is a very pleasant 63 year old male referred by Dr. Bryan Lemma for a large hiatal hernia with upper GI bleed. Patient was diagnosed with multiple myeloma about 2 years ago. He is followed closely by Dr. Lutricia Feil. He had a bone marrow transplant at [facility] His disease is currently well controlled. He has had a known hiatal hernia on imaging but relates he was not really having any symptoms. On questioning he has some occasional heartburn. He does get some upper airway congestion with coughing and throat clearing in the mornings and evenings. No chest pain or dysphagia or nausea or vomiting. He developed melena and fatigue about 6 or 8 weeks ago. Hemoglobin was 7.9. He underwent upper endoscopy and was found to have an approximately 11 cm hiatal hernia with gastritis and erosions in the upper stomach. Recent PET scan also confirmed large hiatal hernia with about 1/2-2/3 of the stomach above the diaphragm. He has responded well to iron and medications and is now asymptomatic in terms of anemia and no further clinical bleeding. Patient works part time with the [employer]. He is accompanied by his wife. Only previous abdominal surgery is bilateral laparoscopic hernia repair. Of note he has a history of DVT diagnosed at the time of his myeloma diagnosis and remains on Xarelto.   Past Surgical History  Laparoscopic Inguinal Hernia Surgery  Bilateral. Shoulder Surgery  Right. Spinal Surgery - Lower Back  Spinal Surgery Midback  Tonsillectomy   Diagnostic Studies History  Colonoscopy  within last year  Allergies  No Known Drug Allergies  Allergies Reconciled   Medication History  Albuterol Sulfate HFA (108 (90 Base)MCG/ACT Aerosol Soln, Inhalation) Active. Tessalon (200MG Capsule, Oral) Active. ZyrTEC Allergy (10MG Capsule, Oral) Active. Vitamin D (1000UNIT Tablet,  Oral) Active. Famvir (500MG Tablet, Oral) Active. Flonase (50MCG/DOSE Inhaler, Nasal) Active. Revlimid (5MG Capsule, Oral) Active. Magnesium Oxide (400MG Capsule, Oral) Active. Zofran (8MG Tablet, Oral) Active. Protonix (20MG Tablet DR, Oral) Active. Probiotic Acidophilus (Oral) Active. Requip (0.25MG Tablet, Oral) Active. Zoloft (100MG Tablet, Oral) Active. Zometa (4MG/5ML Concentrate, Intravenous) Active. Ambien (10MG Tablet, Oral) Active. Acetaminophen (325MG Tablet, Oral as needed) Active. Medications Reconciled  Social History Alcohol use  Moderate alcohol use. Caffeine use  Coffee. Illicit drug use  Prefer to discuss with provider. Tobacco use  Former smoker.  Family History  Alcohol Abuse  Brother. Breast Cancer  Family Members In General. Cerebrovascular Accident  Family Members In General. Cervical Cancer  Mother. Hypertension  Father. Melanoma  Father. Migraine Headache  Mother. Thyroid problems  Father.  Other Problems  Anxiety Disorder  Back Pain  Inguinal Hernia  Kidney Stone  Other disease, cancer, significant illness     Review of Systems  General Present- Weight Gain. Not Present- Appetite Loss, Chills, Fatigue, Fever, Night Sweats and Weight Loss. Skin Not Present- Change in Wart/Mole, Dryness, Hives, Jaundice, New Lesions, Non-Healing Wounds, Rash and Ulcer. HEENT Present- Hoarseness and Wears glasses/contact lenses. Not Present- Earache, Hearing Loss, Nose Bleed, Oral Ulcers, Ringing in the Ears, Seasonal Allergies, Sinus Pain, Sore Throat, Visual Disturbances and Yellow Eyes. Respiratory Present- Chronic Cough and Wheezing. Not Present- Bloody sputum, Difficulty Breathing and Snoring. Breast Not Present- Breast Mass, Breast Pain, Nipple Discharge and Skin Changes. Cardiovascular Present- Leg Cramps. Not Present- Chest Pain, Difficulty Breathing Lying Down, Palpitations, Rapid Heart Rate, Shortness of Breath and Swelling  of Extremities. Gastrointestinal Present- Change in Bowel Habits  and Gets full quickly at meals. Not Present- Abdominal Pain, Bloating, Bloody Stool, Chronic diarrhea, Constipation, Difficulty Swallowing, Excessive gas, Hemorrhoids, Indigestion, Nausea, Rectal Pain and Vomiting. Male Genitourinary Not Present- Blood in Urine, Change in Urinary Stream, Frequency, Impotence, Nocturia, Painful Urination, Urgency and Urine Leakage. Musculoskeletal Present- Back Pain and Joint Pain. Not Present- Joint Stiffness, Muscle Pain, Muscle Weakness and Swelling of Extremities. Neurological Not Present- Decreased Memory, Fainting, Headaches, Numbness, Seizures, Tingling, Tremor, Trouble walking and Weakness. Psychiatric Not Present- Anxiety, Bipolar, Change in Sleep Pattern, Depression, Fearful and Frequent crying. Endocrine Not Present- Cold Intolerance, Excessive Hunger, Hair Changes, Heat Intolerance, Hot flashes and New Diabetes. Hematology Present- Blood Thinners. Not Present- Easy Bruising, Excessive bleeding, Gland problems, HIV and Persistent Infections.  Vitals   Weight: 185 lb Height: 67in Body Surface Area: 1.96 m Body Mass Index: 28.97 kg/m  Temp.: 38F  Pulse: 93 (Regular)  BP: 130/72 (Sitting, Left Arm, Standard)       Physical Exam  The physical exam findings are as follows: Note:General: Alert, well-developed and well nourished Caucasian male, in no distress. Mild kyphosis. Skin: Warm and dry without rash or infection. HEENT: No palpable masses or thyromegaly. Sclera nonicteric. Alopecia. Lymph nodes: No cervical, supraclavicular, or inguinal nodes palpable. Lungs: Breath sounds clear, somewhat diminished right base. No wheezing or increased work of breathing. Cardiovascular: Regular rate and rhythm without murmer. No JVD or edema. Peripheral pulses intact. Abdomen: Nondistended. Soft and nontender. No masses palpable. No organomegaly. No palpable hernias. Extremities:  No edema or joint swelling or deformity. No chronic venous stasis changes. Neurologic: Alert and fully oriented. Gait normal. No focal weakness. Psychiatric: Normal mood and affect. Thought content appropriate with normal judgement and insight    Assessment & Plan  LARGE HIATAL HERNIA (K44.9) Impression: Patient with significant medical problems as above but very stable presents with large hiatal hernia and Cameron erosions and GI bleeding from his upper stomach. Mild GERD. I discussed the nature of the problem and laparoscopic hiatal hernia repair with the patient and his wife. We discussed that without surgery he is likely to have further episodes of bleeding and possibly worsening as his hernia enlarges. We discussed surgery in detail including indications the nature of surgery. I discussed fundoplication as I believe he does have some GERD. We discussed risks including anesthetic complications, bleeding, infection, blood clots, possible need for open procedure, damage to surrounding organs. We discussed long-term issues including recurrence and gas bloat and stricture. He was given literature regarding the procedure. I think his chance of benefit is good and he would like to proceed. Dr. Marin Olp is aware he is having surgery and I will discuss with him any need to hold current medications. We will hold his Xarelto to preoperatively. Current Plans Schedule for Surgery  Laparoscopic repair of large hiatal hernia with fundoplication

## 2018-08-12 NOTE — Progress Notes (Signed)
Anesthesia Chart Review   Case:  [id number] Date/Time:  08/13/18 0715   Procedure:  LAPAROSCOPIC REPAIR OF HIATAL HERNIA REPAIR WITH FUNDOPLICATION (N/A )   Anesthesia type:  General   Pre-op diagnosis:  large hiatal hernia   Location:  [place] [facility]   Surgeon:  Excell Seltzer, MD      DISCUSSION: 63 yo former smoker (7 pack years, quit 11/02/80) with h/o anxiety, depression, GERD, multiple myeloma (s/p bone marrow transplant 08/2016), DVT left and right leg (03/21/2016 on Xarelto), large hiatal hernia scheduled for above surgery on 08/13/2018 with Dr. Excell Seltzer.   Per Dr. [name] [facility]'s H&P dated 08/12/18 Dr. Marin Olp with oncology aware of upcoming surgery.  Last seen by Dr. Marin Olp on 07/20/18.  Per his note, "As far as him having his surgery on January 31, I told him to stop his Xarelto 3 days before surgery.  He understands this.  He will stop his Revlimid 7 days before surgery.  He will restart both the Revlimid and Xarelto a day or so after surgery.  I will see him back in early February."  Pt can proceed with planned procedure barring acute status change.  VS: BP 121/82   Pulse 76   Temp 36.6 C (Oral)   Resp 18   Ht 5' 7" (1.702 m)   Wt 83.2 kg   SpO2 100%   BMI 28.74 kg/m   PROVIDERS: Saguier, Percell Miller, PA-C is PCP   [name], MD is Oncologist LABS: Labs reviewed: Acceptable for surgery. (all labs ordered are listed, but only abnormal results are displayed)  Labs Reviewed  CBC - Abnormal; Notable for the following components:      Result Value   WBC 2.7 (*)    RDW 18.3 (*)    All other components within normal limits  BASIC METABOLIC PANEL     IMAGES: Chest Xray 02/02/18 IMPRESSION: 1. No evidence of infiltrate right upper lung noted on today's exam. Right lung base subsegmental atelectasis and small right pleural effusion noted. Left lung base subsegmental atelectasis/infiltrate noted on today's exam.  2.  Prominent sliding hiatal  hernia.   EKG: 02/01/18 Rate 85 bpm Sinus rhythm  RSR' in V1 or V@, righth VCD or RVH  CV: Echo 11/11/17 Study Conclusions  - Left ventricle: The cavity size was normal. Wall thickness was   normal. Systolic function was normal. The estimated ejection   fraction was in the range of 55% to 60%. Wall motion was normal;   there were no regional wall motion abnormalities. - Aortic valve: There was mild regurgitation. - Mitral valve: There was mild regurgitation.  Impressions:  - Normal LV systolic function; mild AI and MR. Past Medical History:  Diagnosis Date  . Allergy   . Anxiety   . Blood transfusion without reported diagnosis 2018  . Bone metastasis ([place])   . Chest cold 05/19/2016   productive cough  -- started on antibiotic  . Chronic back pain    due to bone mets from myeloma  . Cough   . Depression   . Diverticulitis   . GERD (gastroesophageal reflux disease)   . Hiatal hernia   . History of chicken pox   . History of concussion    age 27 -- no residual  . History of DVT of lower extremity 03/21/2016  treated and completed w/ xarelto   per doppler left extensive occlusion common femoral, femoral, and popliteal veins and right partial occlusion common femoral and  profunda femoral veins/  last doppler 06-04-2016 no evidence acute or chronic dvt noted either leg   . History of radiation therapy 06/09/16-06/23/16   lower thoracic spine 25 Gy in 10 fractions  . Mouth ulcers    secondary to radiation  . Multiple myeloma ([place]) dx 02/22/2016 via bone marrow bx---  oncologist-  [name]   IgG Kappa-- Hyperdiploid/ +11 w/ bone mets--  current treatment chemotherapy (started 08/ 2017)and pallitive radiation to back started 06-09-2016  . Renal calculus, right   . Wears contact lenses     Past Surgical History:  Procedure Laterality Date  . COLONOSCOPY  [id number]  . COLONOSCOPY  01/07/2017   Dr Penelope Coop, [facility]  . CYSTOSCOPY W/ URETERAL STENT PLACEMENT Right 06/20/2016    Procedure: CYSTOSCOPY WITH STENT REPLACEMENT;  Surgeon: Kathie Rhodes, MD;  Location: [facility];  Service: Urology;  Laterality: Right;  . CYSTOSCOPY WITH RETROGRADE PYELOGRAM, URETEROSCOPY AND STENT PLACEMENT Right 05/30/2016   Procedure: CYSTOSCOPY WITH RETROGRADE PYELOGRAM, URETEROSCOPY AND STENT PLACEMENT,DILITATION URETERAL STRICTURE;  Surgeon: Kathie Rhodes, MD;  Location: [facility];  Service: Urology;  Laterality: Right;  . CYSTOSCOPY/RETROGRADE/URETEROSCOPY/STONE EXTRACTION WITH BASKET Right 06/20/2016   Procedure: CYSTOSCOPY/URETEROSCOPY/STONE EXTRACTION WITH BASKET;  Surgeon: Kathie Rhodes, MD;  Location: [facility];  Service: Urology;  Laterality: Right;  . HOLMIUM LASER APPLICATION Right 25/02/5276   Procedure: HOLMIUM LASER APPLICATION;  Surgeon: Kathie Rhodes, MD;  Location: [facility];  Service: Urology;  Laterality: Right;  . IR GENERIC HISTORICAL  02/11/2016   IR RADIOLOGIST EVAL & MGMT 02/11/2016 [facility]  . IR GENERIC HISTORICAL  02/15/2016   IR BONE TUMOR(S)RF ABLATION 02/15/2016 Luanne Bras, MD [facility]  . IR GENERIC HISTORICAL  02/15/2016   IR BONE TUMOR(S)RF ABLATION 02/15/2016 Luanne Bras, MD [facility]  . IR GENERIC HISTORICAL  02/15/2016   IR BONE TUMOR(S)RF ABLATION 02/15/2016 Luanne Bras, MD [facility]  . IR GENERIC HISTORICAL  02/15/2016   IR KYPHO THORACIC WITH BONE BIOPSY 02/15/2016 Luanne Bras, MD [facility]  . IR GENERIC HISTORICAL  02/15/2016   IR KYPHO THORACIC WITH BONE BIOPSY 02/15/2016 Luanne Bras, MD [facility]  . IR GENERIC HISTORICAL  02/15/2016   IR VERTEBROPLASTY CERV/THOR BX INC UNI/BIL INC/INJECT/IMAGING 02/15/2016 Luanne Bras, MD [facility]  . IR GENERIC HISTORICAL  03/13/2016   IR KYPHO EA ADDL LEVEL THORACIC OR LUMBAR 03/13/2016 Luanne Bras, MD [facility]  . IR GENERIC HISTORICAL  03/13/2016   IR KYPHO EA ADDL LEVEL THORACIC OR LUMBAR 03/13/2016 Luanne Bras,  MD [facility]  . IR GENERIC HISTORICAL  03/13/2016   IR BONE TUMOR(S)RF ABLATION 03/13/2016 Luanne Bras, MD [facility]  . IR GENERIC HISTORICAL  03/13/2016   IR KYPHO LUMBAR INC FX REDUCE BONE BX UNI/BIL CANNULATION INC/IMAGING 03/13/2016 Luanne Bras, MD [facility]  . IR GENERIC HISTORICAL  03/13/2016   IR BONE TUMOR(S)RF ABLATION 03/13/2016 Luanne Bras, MD [facility]  . IR GENERIC HISTORICAL  03/13/2016   IR BONE TUMOR(S)RF ABLATION 03/13/2016 Luanne Bras, MD [facility]  . IR GENERIC HISTORICAL  03/31/2016   IR RADIOLOGIST EVAL & MGMT 03/31/2016 [facility]  . KYPHOPLASTY     02/2016, 03/2016, 11/2016  . LAPAROSCOPIC INGUINAL HERNIA REPAIR Bilateral 12-16-2013  dr gross  . RADIOLOGY WITH ANESTHESIA N/A 02/15/2016   Procedure: Spinal Ablation;  Surgeon: Luanne Bras, MD;  Location: [place];  Service: Radiology;  Laterality: N/A;  . RADIOLOGY WITH ANESTHESIA N/A 03/13/2016   Procedure: Cyndy Freeze  ABLATION;  Surgeon: Luanne Bras, MD;  Location: [place];  Service: Radiology;  Laterality: N/A;  . ROTATOR CUFF REPAIR Right 2003  . spine operation     x1 in 2018 x3 in 2017  . TONSILLECTOMY  age 20  . VERTEBROPLASTY  01/2016  . WISDOM TOOTH EXTRACTION      MEDICATIONS: . acetaminophen (TYLENOL) 325 MG tablet  . albuterol (PROVENTIL HFA;VENTOLIN HFA) 108 (90 Base) MCG/ACT inhaler  . amoxicillin (AMOXIL) 500 MG capsule  . amoxicillin-clavulanate (AUGMENTIN) 875-125 MG tablet  . cetirizine (ZYRTEC) 10 MG tablet  . Cholecalciferol (VITAMIN D3) 250 MCG (10000 UT) capsule  . famciclovir (FAMVIR) 500 MG tablet  . fluticasone (FLONASE) 50 MCG/ACT nasal spray  . lenalidomide (REVLIMID) 5 MG capsule  . lidocaine (LIDODERM) 5 %  . Magnesium 400 MG CAPS  . OVER THE COUNTER MEDICATION  . pantoprazole (PROTONIX) 20 MG tablet  . Probiotic CAPS  . rOPINIRole (REQUIP) 0.25 MG tablet  . sertraline (ZOLOFT) 100 MG tablet  . Vitamins-Lipotropics (LIPO-FLAVONOID PLUS PO)  .  XARELTO 10 MG TABS tablet  . Zoledronic Acid (ZOMETA IV)  . zolpidem (AMBIEN) 10 MG tablet   . 0.9 %  sodium chloride infusion   . [START ON 08/13/2018] bupivacaine liposome (EXPAREL) 1.3 % injection 266 mg    Maia Plan [facility]Surgical Testing [phone] 08/12/18 2:25 PM

## 2018-08-13 ENCOUNTER — Ambulatory Visit (HOSPITAL_COMMUNITY): Payer: 59 | Admitting: Physician Assistant

## 2018-08-13 ENCOUNTER — Ambulatory Visit (HOSPITAL_COMMUNITY): Payer: 59 | Admitting: Certified Registered"

## 2018-08-13 ENCOUNTER — Encounter (HOSPITAL_COMMUNITY): Payer: Self-pay | Admitting: Emergency Medicine

## 2018-08-13 ENCOUNTER — Other Ambulatory Visit: Payer: Self-pay

## 2018-08-13 ENCOUNTER — Encounter (HOSPITAL_COMMUNITY)
Admission: RE | Disposition: A | Discharge status: Home / Self Care | Payer: Self-pay | Source: Home / Self Care | Attending: General Surgery

## 2018-08-13 ENCOUNTER — Observation Stay (HOSPITAL_COMMUNITY)
Admission: RE | Admit: 2018-08-13 | Discharge: 2018-08-14 | Disposition: A | Discharge status: Home / Self Care | Payer: 59 | Attending: General Surgery | Admitting: General Surgery

## 2018-08-13 DIAGNOSIS — Z79899 Other long term (current) drug therapy: Secondary | ICD-10-CM | POA: Insufficient documentation

## 2018-08-13 DIAGNOSIS — F329 Major depressive disorder, single episode, unspecified: Secondary | ICD-10-CM | POA: Insufficient documentation

## 2018-08-13 DIAGNOSIS — Z7951 Long term (current) use of inhaled steroids: Secondary | ICD-10-CM | POA: Insufficient documentation

## 2018-08-13 DIAGNOSIS — Z7901 Long term (current) use of anticoagulants: Secondary | ICD-10-CM | POA: Diagnosis not present

## 2018-08-13 DIAGNOSIS — K449 Diaphragmatic hernia without obstruction or gangrene: Principal | ICD-10-CM

## 2018-08-13 DIAGNOSIS — K254 Chronic or unspecified gastric ulcer with hemorrhage: Secondary | ICD-10-CM | POA: Insufficient documentation

## 2018-08-13 DIAGNOSIS — F419 Anxiety disorder, unspecified: Secondary | ICD-10-CM | POA: Diagnosis not present

## 2018-08-13 DIAGNOSIS — Z87891 Personal history of nicotine dependence: Secondary | ICD-10-CM | POA: Diagnosis not present

## 2018-08-13 DIAGNOSIS — Z86718 Personal history of other venous thrombosis and embolism: Secondary | ICD-10-CM | POA: Insufficient documentation

## 2018-08-13 DIAGNOSIS — K219 Gastro-esophageal reflux disease without esophagitis: Secondary | ICD-10-CM | POA: Diagnosis not present

## 2018-08-13 DIAGNOSIS — C9000 Multiple myeloma not having achieved remission: Secondary | ICD-10-CM | POA: Insufficient documentation

## 2018-08-13 DIAGNOSIS — Z9481 Bone marrow transplant status: Secondary | ICD-10-CM | POA: Diagnosis not present

## 2018-08-13 HISTORY — PX: HIATAL HERNIA REPAIR: SHX195

## 2018-08-13 SURGERY — REPAIR, HERNIA, HIATAL, LAPAROSCOPIC
Anesthesia: General

## 2018-08-13 MED ORDER — ENOXAPARIN SODIUM 40 MG/0.4ML ~~LOC~~ SOLN
40.0000 mg | SUBCUTANEOUS | Status: DC
  Administered 2018-08-14: 40 mg via SUBCUTANEOUS
  Filled 2018-08-13 (×2): qty 0.4

## 2018-08-13 MED ORDER — BUPIVACAINE LIPOSOME 1.3 % IJ SUSP
INTRAMUSCULAR | Status: DC | PRN
  Administered 2018-08-13: 20 mL

## 2018-08-13 MED ORDER — MIDAZOLAM HCL 2 MG/2ML IJ SOLN
INTRAMUSCULAR | Status: DC | PRN
  Administered 2018-08-13: 2 mg via INTRAVENOUS

## 2018-08-13 MED ORDER — SUGAMMADEX SODIUM 200 MG/2ML IV SOLN
INTRAVENOUS | Status: DC | PRN
  Administered 2018-08-13: 200 mg via INTRAVENOUS

## 2018-08-13 MED ORDER — ONDANSETRON 4 MG PO TBDP: 4.0000 mg | ORAL_TABLET | Freq: Four times a day (QID) | ORAL | Status: DC | PRN

## 2018-08-13 MED ORDER — GABAPENTIN 300 MG PO CAPS
300.0000 mg | ORAL_CAPSULE | Freq: Two times a day (BID) | ORAL | Status: DC
  Administered 2018-08-13 – 2018-08-14 (×2): 300 mg via ORAL
  Filled 2018-08-13 (×2): qty 1

## 2018-08-13 MED ORDER — SUGAMMADEX SODIUM 200 MG/2ML IV SOLN
INTRAVENOUS | Status: AC
  Filled 2018-08-13: qty 2

## 2018-08-13 MED ORDER — FENTANYL CITRATE (PF) 250 MCG/5ML IJ SOLN
INTRAMUSCULAR | Status: AC
  Filled 2018-08-13: qty 5

## 2018-08-13 MED ORDER — ROCURONIUM BROMIDE 10 MG/ML (PF) SYRINGE
PREFILLED_SYRINGE | INTRAVENOUS | Status: DC | PRN
  Administered 2018-08-13: 10 mg via INTRAVENOUS
  Administered 2018-08-13: 20 mg via INTRAVENOUS
  Administered 2018-08-13: 10 mg via INTRAVENOUS
  Administered 2018-08-13: 20 mg via INTRAVENOUS
  Administered 2018-08-13: 10 mg via INTRAVENOUS
  Administered 2018-08-13: 20 mg via INTRAVENOUS
  Administered 2018-08-13 (×2): 10 mg via INTRAVENOUS
  Administered 2018-08-13: 50 mg via INTRAVENOUS
  Administered 2018-08-13: 10 mg via INTRAVENOUS

## 2018-08-13 MED ORDER — ESMOLOL HCL 100 MG/10ML IV SOLN
INTRAVENOUS | Status: AC
  Filled 2018-08-13: qty 10

## 2018-08-13 MED ORDER — KETAMINE HCL 10 MG/ML IJ SOLN
INTRAMUSCULAR | Status: AC
  Filled 2018-08-13: qty 1

## 2018-08-13 MED ORDER — OXYCODONE HCL 5 MG PO TABS
ORAL_TABLET | ORAL | Status: AC
  Filled 2018-08-13: qty 2

## 2018-08-13 MED ORDER — ALBUTEROL SULFATE (2.5 MG/3ML) 0.083% IN NEBU: 3.0000 mL | INHALATION_SOLUTION | Freq: Four times a day (QID) | RESPIRATORY_TRACT | Status: DC | PRN

## 2018-08-13 MED ORDER — PANTOPRAZOLE SODIUM 40 MG IV SOLR
40.0000 mg | Freq: Every day | INTRAVENOUS | Status: DC
  Administered 2018-08-13: 40 mg via INTRAVENOUS
  Filled 2018-08-13 (×2): qty 40

## 2018-08-13 MED ORDER — BUPIVACAINE-EPINEPHRINE (PF) 0.5% -1:200000 IJ SOLN
INTRAMUSCULAR | Status: AC
  Filled 2018-08-13: qty 30

## 2018-08-13 MED ORDER — ACETAMINOPHEN 500 MG PO TABS
1000.0000 mg | ORAL_TABLET | ORAL | Status: AC
  Administered 2018-08-13: 1000 mg via ORAL
  Filled 2018-08-13: qty 2

## 2018-08-13 MED ORDER — CEFAZOLIN SODIUM-DEXTROSE 2-4 GM/100ML-% IV SOLN
2.0000 g | INTRAVENOUS | Status: AC
  Administered 2018-08-13: 2 g via INTRAVENOUS
  Filled 2018-08-13: qty 100

## 2018-08-13 MED ORDER — KETAMINE HCL 10 MG/ML IJ SOLN
INTRAMUSCULAR | Status: DC | PRN
  Administered 2018-08-13 (×2): 15 mg via INTRAVENOUS

## 2018-08-13 MED ORDER — LIDOCAINE 2% (20 MG/ML) 5 ML SYRINGE
INTRAMUSCULAR | Status: AC
  Filled 2018-08-13: qty 5

## 2018-08-13 MED ORDER — EPHEDRINE SULFATE-NACL 50-0.9 MG/10ML-% IV SOSY
PREFILLED_SYRINGE | INTRAVENOUS | Status: DC | PRN
  Administered 2018-08-13 (×3): 5 mg via INTRAVENOUS

## 2018-08-13 MED ORDER — ROPINIROLE HCL 1 MG PO TABS
0.5000 mg | ORAL_TABLET | Freq: Every evening | ORAL | Status: DC | PRN
  Filled 2018-08-13: qty 1

## 2018-08-13 MED ORDER — LACTATED RINGERS IV SOLN
INTRAVENOUS | Status: AC | PRN
  Administered 2018-08-13: 1000 mL

## 2018-08-13 MED ORDER — PROPOFOL 10 MG/ML IV BOLUS
INTRAVENOUS | Status: AC
  Filled 2018-08-13: qty 40

## 2018-08-13 MED ORDER — VALACYCLOVIR HCL 500 MG PO TABS
1000.0000 mg | ORAL_TABLET | Freq: Every day | ORAL | Status: DC
  Administered 2018-08-13 – 2018-08-14 (×2): 1000 mg via ORAL
  Filled 2018-08-13 (×2): qty 2

## 2018-08-13 MED ORDER — MORPHINE SULFATE (PF) 4 MG/ML IV SOLN: 2.0000 mg | INTRAVENOUS | Status: DC | PRN

## 2018-08-13 MED ORDER — ONDANSETRON HCL 4 MG/2ML IJ SOLN
INTRAMUSCULAR | Status: AC
  Filled 2018-08-13: qty 2

## 2018-08-13 MED ORDER — PHENYLEPHRINE 40 MCG/ML (10ML) SYRINGE FOR IV PUSH (FOR BLOOD PRESSURE SUPPORT)
PREFILLED_SYRINGE | INTRAVENOUS | Status: DC | PRN
  Administered 2018-08-13: 80 ug via INTRAVENOUS
  Administered 2018-08-13 (×3): 40 ug via INTRAVENOUS
  Administered 2018-08-13: 120 ug via INTRAVENOUS

## 2018-08-13 MED ORDER — ONDANSETRON HCL 4 MG/2ML IJ SOLN: 4.0000 mg | Freq: Four times a day (QID) | INTRAMUSCULAR | Status: DC | PRN

## 2018-08-13 MED ORDER — SERTRALINE HCL 100 MG PO TABS
100.0000 mg | ORAL_TABLET | Freq: Every evening | ORAL | Status: DC
  Administered 2018-08-13: 100 mg via ORAL
  Filled 2018-08-13: qty 1

## 2018-08-13 MED ORDER — LIDOCAINE 2% (20 MG/ML) 5 ML SYRINGE
INTRAMUSCULAR | Status: DC | PRN
  Administered 2018-08-13: 1.5 mg/kg/h via INTRAVENOUS

## 2018-08-13 MED ORDER — ONDANSETRON HCL 4 MG/2ML IJ SOLN
INTRAMUSCULAR | Status: DC | PRN
  Administered 2018-08-13: 4 mg via INTRAVENOUS

## 2018-08-13 MED ORDER — FENTANYL CITRATE (PF) 250 MCG/5ML IJ SOLN
INTRAMUSCULAR | Status: DC | PRN
  Administered 2018-08-13: 100 ug via INTRAVENOUS
  Administered 2018-08-13 (×2): 50 ug via INTRAVENOUS

## 2018-08-13 MED ORDER — ESMOLOL HCL 100 MG/10ML IV SOLN
INTRAVENOUS | Status: DC | PRN
  Administered 2018-08-13: 20 mg via INTRAVENOUS

## 2018-08-13 MED ORDER — PHENYLEPHRINE HCL 10 MG/ML IJ SOLN
INTRAMUSCULAR | Status: AC
  Filled 2018-08-13: qty 1

## 2018-08-13 MED ORDER — GABAPENTIN 300 MG PO CAPS
300.0000 mg | ORAL_CAPSULE | ORAL | Status: AC
  Administered 2018-08-13: 300 mg via ORAL
  Filled 2018-08-13: qty 1

## 2018-08-13 MED ORDER — ROCURONIUM BROMIDE 100 MG/10ML IV SOLN
INTRAVENOUS | Status: AC
  Filled 2018-08-13: qty 1

## 2018-08-13 MED ORDER — OXYCODONE HCL 5 MG PO TABS
5.0000 mg | ORAL_TABLET | ORAL | Status: DC | PRN
  Administered 2018-08-13 (×2): 10 mg via ORAL
  Administered 2018-08-13: 5 mg via ORAL
  Administered 2018-08-14: 10 mg via ORAL
  Filled 2018-08-13 (×2): qty 2

## 2018-08-13 MED ORDER — MIDAZOLAM HCL 2 MG/2ML IJ SOLN
INTRAMUSCULAR | Status: AC
  Filled 2018-08-13: qty 2

## 2018-08-13 MED ORDER — DEXAMETHASONE SODIUM PHOSPHATE 10 MG/ML IJ SOLN
INTRAMUSCULAR | Status: DC | PRN
  Administered 2018-08-13: 4 mg via INTRAVENOUS

## 2018-08-13 MED ORDER — LIDOCAINE 2% (20 MG/ML) 5 ML SYRINGE: INTRAMUSCULAR | Status: DC | PRN

## 2018-08-13 MED ORDER — SODIUM CHLORIDE (PF) 0.9 % IJ SOLN
INTRAMUSCULAR | Status: AC
  Filled 2018-08-13: qty 50

## 2018-08-13 MED ORDER — POTASSIUM CHLORIDE IN NACL 20-0.9 MEQ/L-% IV SOLN
INTRAVENOUS | Status: DC
  Administered 2018-08-13: 19:00:00 via INTRAVENOUS
  Filled 2018-08-13 (×2): qty 1000

## 2018-08-13 MED ORDER — SODIUM CHLORIDE (PF) 0.9 % IJ SOLN
INTRAMUSCULAR | Status: DC | PRN
  Administered 2018-08-13: 23 mL

## 2018-08-13 MED ORDER — LIDOCAINE 2% (20 MG/ML) 5 ML SYRINGE
INTRAMUSCULAR | Status: DC | PRN
  Administered 2018-08-13: 50 mg via INTRAVENOUS

## 2018-08-13 MED ORDER — BUPIVACAINE-EPINEPHRINE 0.5% -1:200000 IJ SOLN
INTRAMUSCULAR | Status: DC | PRN
  Administered 2018-08-13: 30 mL

## 2018-08-13 MED ORDER — EVICEL 5 ML EX KIT
PACK | Freq: Once | CUTANEOUS | Status: DC
  Filled 2018-08-13: qty 1

## 2018-08-13 MED ORDER — FENTANYL CITRATE (PF) 100 MCG/2ML IJ SOLN
25.0000 ug | INTRAMUSCULAR | Status: DC | PRN
  Administered 2018-08-13 (×2): 50 ug via INTRAVENOUS

## 2018-08-13 MED ORDER — CHLORHEXIDINE GLUCONATE CLOTH 2 % EX PADS: 6.0000 | MEDICATED_PAD | Freq: Once | CUTANEOUS | Status: DC

## 2018-08-13 MED ORDER — LORAZEPAM 2 MG/ML IJ SOLN
1.0000 mg | Freq: Every evening | INTRAMUSCULAR | Status: DC | PRN
  Administered 2018-08-13: 1 mg via INTRAVENOUS
  Filled 2018-08-13: qty 1

## 2018-08-13 MED ORDER — PROPOFOL 10 MG/ML IV BOLUS
INTRAVENOUS | Status: DC | PRN
  Administered 2018-08-13: 150 mg via INTRAVENOUS

## 2018-08-13 MED ORDER — DEXAMETHASONE SODIUM PHOSPHATE 10 MG/ML IJ SOLN
INTRAMUSCULAR | Status: AC
  Filled 2018-08-13: qty 1

## 2018-08-13 MED ORDER — FLUTICASONE PROPIONATE 50 MCG/ACT NA SUSP
2.0000 | Freq: Every day | NASAL | Status: DC
  Administered 2018-08-13 – 2018-08-14 (×2): 2 via NASAL
  Filled 2018-08-13: qty 16

## 2018-08-13 MED ORDER — FENTANYL CITRATE (PF) 100 MCG/2ML IJ SOLN
INTRAMUSCULAR | Status: AC
  Filled 2018-08-13: qty 2

## 2018-08-13 MED ORDER — LACTATED RINGERS IV SOLN
INTRAVENOUS | Status: DC
  Administered 2018-08-13 (×2): via INTRAVENOUS

## 2018-08-13 MED ORDER — PHENYLEPHRINE 40 MCG/ML (10ML) SYRINGE FOR IV PUSH (FOR BLOOD PRESSURE SUPPORT)
PREFILLED_SYRINGE | INTRAVENOUS | Status: AC
  Filled 2018-08-13: qty 10

## 2018-08-13 SURGICAL SUPPLY — 46 items
APPLIER CLIP ROT 10 11.4 M/L (STAPLE)
CABLE HIGH FREQUENCY MONO STRZ (ELECTRODE) ×2 IMPLANT
CLIP APPLIE ROT 10 11.4 M/L (STAPLE) IMPLANT
COVER SURGICAL LIGHT HANDLE (MISCELLANEOUS) ×2 IMPLANT
COVER WAND RF STERILE (DRAPES) ×2 IMPLANT
DECANTER SPIKE VIAL GLASS SM (MISCELLANEOUS) ×6 IMPLANT
DERMABOND ADVANCED (GAUZE/BANDAGES/DRESSINGS) ×1
DERMABOND ADVANCED .7 DNX12 (GAUZE/BANDAGES/DRESSINGS) ×1 IMPLANT
DEVICE SUT QUICK LOAD TK 5 (STAPLE) ×12 IMPLANT
DEVICE SUT TI-KNOT TK 5X26 (MISCELLANEOUS) ×2 IMPLANT
DEVICE SUTURE ENDOST 10MM (ENDOMECHANICALS) ×2 IMPLANT
DISSECTOR BLUNT TIP ENDO 5MM (MISCELLANEOUS) ×2 IMPLANT
DRAIN PENROSE 18X1/2 LTX STRL (DRAIN) ×2 IMPLANT
ELECT REM PT RETURN 15FT ADLT (MISCELLANEOUS) ×2 IMPLANT
GLOVE BIOGEL PI IND STRL 7.5 (GLOVE) ×1 IMPLANT
GLOVE BIOGEL PI INDICATOR 7.5 (GLOVE) ×1
GLOVE ECLIPSE 7.5 STRL STRAW (GLOVE) ×2 IMPLANT
GOWN STRL REUS W/TWL XL LVL3 (GOWN DISPOSABLE) ×8 IMPLANT
KIT BASIN OR (CUSTOM PROCEDURE TRAY) ×2 IMPLANT
MESH HERNIA 7X10 (Mesh General) ×2 IMPLANT
PAD POSITIONING PINK XL (MISCELLANEOUS) ×2 IMPLANT
RELOAD ENDO STITCH (ENDOMECHANICALS) ×4 IMPLANT
SCISSORS LAP 5X35 DISP (ENDOMECHANICALS) ×2 IMPLANT
SET IRRIG TUBING LAPAROSCOPIC (IRRIGATION / IRRIGATOR) ×2 IMPLANT
SET TUBE SMOKE EVAC HIGH FLOW (TUBING) ×2 IMPLANT
SHEARS HARMONIC ACE PLUS 36CM (ENDOMECHANICALS) IMPLANT
SHEARS HARMONIC ACE PLUS 45CM (MISCELLANEOUS) ×2 IMPLANT
SLEEVE ADV FIXATION 5X100MM (TROCAR) ×6 IMPLANT
STAPLER VISISTAT 35W (STAPLE) IMPLANT
SUT MNCRL AB 4-0 PS2 18 (SUTURE) ×2 IMPLANT
SUT SURGIDAC NAB ES-9 0 48 120 (SUTURE) ×6 IMPLANT
SUT VIC AB 3-0 SH 27 (SUTURE) ×1
SUT VIC AB 3-0 SH 27XBRD (SUTURE) ×1 IMPLANT
TIP INNERVISION DETACH 40FR (MISCELLANEOUS) IMPLANT
TIP INNERVISION DETACH 50FR (MISCELLANEOUS) IMPLANT
TIP INNERVISION DETACH 56FR (MISCELLANEOUS) IMPLANT
TIP RIGID 35CM EVICEL (HEMOSTASIS) ×2 IMPLANT
TIPS INNERVISION DETACH 40FR (MISCELLANEOUS)
TOWEL OR 17X26 10 PK STRL BLUE (TOWEL DISPOSABLE) ×2 IMPLANT
TOWEL OR NON WOVEN STRL DISP B (DISPOSABLE) ×2 IMPLANT
TRAY FOLEY MTR SLVR 16FR STAT (SET/KITS/TRAYS/PACK) ×2 IMPLANT
TRAY FOLEY W/BAG SLVR 14FR (SET/KITS/TRAYS/PACK) IMPLANT
TRAY LAPAROSCOPIC (CUSTOM PROCEDURE TRAY) ×2 IMPLANT
TROCAR ADV FIXATION 11X100MM (TROCAR) ×2 IMPLANT
TROCAR ADV FIXATION 5X100MM (TROCAR) ×2 IMPLANT
TROCAR BLADELESS OPT 5 100 (ENDOMECHANICALS) ×2 IMPLANT

## 2018-08-13 NOTE — Op Note (Signed)
Preoperative Diagnosis: large hiatal hernia  Postoprative Diagnosis: large hiatal hernia  Procedure: Procedure(s): LAPAROSCOPIC REPAIR OF HIATAL HERNIA REPAIR WITH FUNDOPLICATION   Surgeon: Excell Seltzer T   Assistants: Autumn Messing  Anesthesia:  General endotracheal anesthesia  Indications: Patient presents with large hiatal hernia and Cameron erosions and GI bleeding from his upper stomach. Mild GERD.  After extensive preoperative work-up and discussion detailed elsewhere we have elected to proceed with laparoscopic repair of his large hiatal hernia with Nissen fundoplication.    Procedure Detail: Patient was brought to the operating room, placed in the supine position on the operating table, and general endotracheal anesthesia induced.  He received preoperative IV antibiotics.  PAS were in place.  Foley catheter was placed.  The abdomen was widely sterilely prepped and draped.  Patient timeout was performed and correct procedure verified.  Access was obtained with a 5 mm Optiview trocar in the left upper quadrant without difficulty and pneumoperitoneum established.  Under direct vision a 5 mm trocar was placed in the lateral right upper quadrant, a 11 mm trocar in the right mid abdomen, 5 mm trocar just to the left of the umbilicus for the camera port and one additional 5 mm trocar laterally in the left upper quadrant.  Through a 5 mm subxiphoid site the [facility] retractor was placed in the left lobe the liver elevated.  A bilateral T AP block was performed with dilute Exparel.  There was noted to be a very large hiatal hernia with approximately 50% of the stomach in the chest.  The stomach was retracted back down into the abdomen and the hiatus exposed.  The gastrohepatic omentum was opened with harmonic scalpel along an avascular area on the right crus exposed.  The peritoneum along the right crus was incised with the harmonic scalpel and this dissection carried up over the anterior crus and  down the anterior portion of the left crus.  The hernia sac was separated and extensively dissected out of the mediastinum.  The esophagus was identified and carefully protected.  After the hernia sac was completely dissected anteriorly the greater curve of the stomach was exposed superiorly and short gastric vessels were taken down with harmonic scalpel and the lesser sac entered.  The greater curve was dissected up to the left crus.  Further attachments of the stomach to the left crus were divided and then the hernia sac could be further dissected down along the left crus posteriorly and further reduced.  The confluence of the crura posteriorly were identified and dissected and a Penrose drain placed around the esophagus which was elevated.  This allowed good exposure and retraction of the esophagus which was extensively mobilized out of the mediastinum with careful dissection using the harmonic scalpel up toward the azygos vein.  With complete mobilization at least 2 to 3 cm of esophagus lay in the abdomen without tension.  The crura were completely dissected and exposed.  A posterior crural repair was performed with 4 interrupted 0 Ethibond sutures closing the hiatus down to about 2-1/2 cm.  I then used a [employer] which was moistened and slit additionally, introduced into the abdomen and it was positioned around the esophagus with the opening laterally to the left and the solid mesh covering the anterior and posterior and right lateral hiatus.  It was then sutured in place with interrupted 3-0 Vicryl and additionally fixated with Evaseal.  Following this a mobile portion of the fundus was easily brought back behind the esophagus and  a contiguous portion of fundus on the left side identified for the wrap.  A 56 bougie dilator was introduced orally and passed into the stomach without difficulty.  A 2 cm wrap was then created with interrupted 2-0 Ethibond sutures incorporating a bite of the anterior esophagus.   The dilator was removed and the wrap was seen to be nice and floppy.  There was no bleeding.  No evidence of injury or other problems.  All CO2 was evacuated and trochars removed after removing the Nathanson retractor under direct vision.  Skin incisions were closed with subcuticular 4-0 Monocryl and Dermabond.  Sponge needle and instrument counts were correct.    Findings: As above  Estimated Blood Loss:  less than 100 mL         Drains: None  Blood Given: none          Specimens: None        Complications:  * No complications entered in OR log *         Disposition: PACU - hemodynamically stable.         Condition: stable

## 2018-08-13 NOTE — Anesthesia Procedure Notes (Signed)
Procedure Name: Intubation Date/Time: 08/13/2018 7:38 AM Performed by: Eben Burow, CRNA Pre-anesthesia Checklist: Patient identified, Emergency Drugs available, Suction available, Patient being monitored and Timeout performed Patient Re-evaluated:Patient Re-evaluated prior to induction Oxygen Delivery Method: Circle system utilized Preoxygenation: Pre-oxygenation with 100% oxygen Induction Type: IV induction Ventilation: Mask ventilation without difficulty Laryngoscope Size: Mac and 3 Grade View: Grade I Tube type: Oral Number of attempts: 1 Airway Equipment and Method: Stylet Placement Confirmation: ETT inserted through vocal cords under direct vision,  positive ETCO2 and breath sounds checked- equal and bilateral Secured at: 22 cm Tube secured with: Tape Dental Injury: Teeth and Oropharynx as per pre-operative assessment

## 2018-08-13 NOTE — Anesthesia Postprocedure Evaluation (Signed)
Anesthesia Post Note  Patient: Jorge [name]  Procedure(s) Performed: LAPAROSCOPIC REPAIR OF HIATAL HERNIA REPAIR WITH FUNDOPLICATION (N/A )     Patient location during evaluation: PACU Anesthesia Type: General Level of consciousness: awake and alert Pain management: pain level controlled Vital Signs Assessment: post-procedure vital signs reviewed and stable Respiratory status: spontaneous breathing, nonlabored ventilation, respiratory function stable and patient connected to nasal cannula oxygen Cardiovascular status: blood pressure returned to baseline and stable Postop Assessment: no apparent nausea or vomiting Anesthetic complications: no    Last Vitals:  Vitals:   08/13/18 1330 08/13/18 1415  BP: 130/86 (!) 148/94  Pulse: 74 74  Resp: 15 15  Temp: 36.9 C 36.9 C  SpO2: 95% 93%    Last Pain:  Vitals:   08/13/18 1330  PainSc: 3                  Ryder Chesmore L Isador Castille

## 2018-08-13 NOTE — Interval H&P Note (Signed)
History and Physical Interval Note:  08/13/2018 7:24 AM  Jorge [name]  has presented today for surgery, with the diagnosis of large hiatal hernia  The various methods of treatment have been discussed with the patient and family. After consideration of risks, benefits and other options for treatment, the patient has consented to  Procedure(s): Amelia (N/A) as a surgical intervention .  The patient's history has been reviewed, patient examined, no change in status, stable for surgery.  I have reviewed the patient's chart and labs.  Questions were answered to the patient's satisfaction.     Darene Lamer Raelin Pixler

## 2018-08-13 NOTE — Transfer of Care (Signed)
Immediate Anesthesia Transfer of Care Note  Patient: Jorge [name]  Procedure(s) Performed: LAPAROSCOPIC REPAIR OF HIATAL HERNIA REPAIR WITH FUNDOPLICATION (N/A )  Patient Location: PACU  Anesthesia Type:General  Level of Consciousness: awake, alert  and oriented  Airway & Oxygen Therapy: Patient Spontanous Breathing and Patient connected to face mask oxygen  Post-op Assessment: Report given to RN and Post -op Vital signs reviewed and stable  Post vital signs: Reviewed and stable  Last Vitals:  Vitals Value Taken Time  BP 151/96 08/13/2018 10:32 AM  Temp    Pulse 91 08/13/2018 10:33 AM  Resp 14 08/13/2018 10:33 AM  SpO2 97 % 08/13/2018 10:33 AM  Vitals shown include unvalidated device data.  Last Pain:  Vitals:   08/13/18 0606  PainSc: 0-No pain      Patients Stated Pain Goal: 4 (41/75/30 1040)  Complications: No apparent anesthesia complications

## 2018-08-14 DIAGNOSIS — K219 Gastro-esophageal reflux disease without esophagitis: Secondary | ICD-10-CM | POA: Diagnosis not present

## 2018-08-14 DIAGNOSIS — K449 Diaphragmatic hernia without obstruction or gangrene: Secondary | ICD-10-CM | POA: Diagnosis not present

## 2018-08-14 DIAGNOSIS — K254 Chronic or unspecified gastric ulcer with hemorrhage: Secondary | ICD-10-CM | POA: Diagnosis not present

## 2018-08-14 LAB — BASIC METABOLIC PANEL
Anion gap: 8 (ref 5–15)
BUN: 11 mg/dL (ref 8–23)
CO2: 27 mmol/L (ref 22–32)
Calcium: 8.9 mg/dL (ref 8.9–10.3)
Chloride: 104 mmol/L (ref 98–111)
Creatinine, Ser: 0.9 mg/dL (ref 0.61–1.24)
GFR calc non Af Amer: >60 mL/min (ref >60)
Glucose, Bld: 107 mg/dL — ABNORMAL HIGH (ref 70–99)
Potassium: 4.9 mmol/L (ref 3.5–5.1)
Sodium: 139 mmol/L (ref 135–145)

## 2018-08-14 LAB — CBC
HCT: 43.8 % (ref 39.0–52.0)
Hemoglobin: 14 g/dL (ref 13.0–17.0)
MCH: 32.1 pg (ref 26.0–34.0)
MCHC: 32 g/dL (ref 30.0–36.0)
MCV: 100.5 fL — ABNORMAL HIGH (ref 80.0–100.0)
Platelets: 130 10*3/uL — ABNORMAL LOW (ref 150–400)
RBC: 4.36 MIL/uL (ref 4.22–5.81)
RDW: 18.4 % — ABNORMAL HIGH (ref 11.5–15.5)
WBC: 6.1 10*3/uL (ref 4.0–10.5)
nRBC: 0 % (ref 0.0–0.2)

## 2018-08-14 MED ORDER — OXYCODONE HCL 5 MG PO TABS: 5.0000 mg | ORAL_TABLET | Freq: Four times a day (QID) | ORAL | 0 refills | Status: DC | PRN

## 2018-08-14 NOTE — Progress Notes (Signed)
1 Day Post-Op   Subjective/Chief Complaint: Complains of soreness. Otherwise ok. No nausea with liquids   Objective: Vital signs in last 24 hours: Temp:  [97.4 F (36.3 C)-98.8 F (37.1 C)] 98.8 F (37.1 C) (02/01 0553) Pulse Rate:  [74-92] 79 (02/01 0553) Resp:  [13-18] 18 (02/01 0553) BP: (116-151)/(80-103) 127/85 (02/01 0553) SpO2:  [91 %-98 %] 91 % (02/01 0553) Last BM Date: 08/13/18  Intake/Output from previous day: 01/31 0701 - 02/01 0700 In: 2806.5 [P.O.:320; I.V.:2386.5; IV Piggyback:100] Out: 2590 [Urine:2540; Blood:50] Intake/Output this shift: Total I/O In: 821.5 [I.V.:821.5] Out: 2300 [Urine:2300]  General appearance: alert and cooperative Resp: clear to auscultation bilaterally Cardio: regular rate and rhythm GI: soft, mild tenderness. good bs  Lab Results:  Recent Labs    08/11/18 1108 08/14/18 0416  WBC 2.7* 6.1  HGB 14.7 14.0  HCT 43.9 43.8  PLT 167 130*   BMET Recent Labs    08/11/18 1108 08/14/18 0416  NA 139 139  K 4.3 4.9  CL 106 104  CO2 25 27  GLUCOSE 100* 107*  BUN 17 11  CREATININE 0.88 0.90  CALCIUM 9.9 8.9   PT/INR No results for input(s): LABPROT, INR in the last 72 hours. ABG No results for input(s): PHART, HCO3 in the last 72 hours.  Invalid input(s): PCO2, PO2  Studies/Results: No results found.  Anti-infectives: Anti-infectives (From admission, onward)   Start     Dose/Rate Route Frequency Ordered Stop   08/13/18 1500  valACYclovir (VALTREX) tablet 1,000 mg     1,000 mg Oral Daily 08/13/18 1252     08/13/18 0600  ceFAZolin (ANCEF) IVPB 2g/100 mL premix     2 g 200 mL/hr over 30 Minutes Intravenous On call to O.R. 08/13/18 0543 08/13/18 0757      Assessment/Plan: s/p Procedure(s): LAPAROSCOPIC REPAIR OF HIATAL HERNIA REPAIR WITH FUNDOPLICATION (N/A) Advance diet  If he tolerates then consider d/c later today or tomorrow  LOS: 0 days    Autumn Messing III 08/14/2018

## 2018-08-14 NOTE — Progress Notes (Signed)
Assessment unchanged. Pt and wife verbalized understanding of dc instructions through teach back including follow up care and when to call the doctor. No scripts. Discharged via wc to front entrance accompanied by NT and wife.

## 2018-08-17 ENCOUNTER — Encounter (HOSPITAL_COMMUNITY): Payer: Self-pay | Admitting: General Surgery

## 2018-08-17 ENCOUNTER — Other Ambulatory Visit: Payer: Self-pay | Admitting: Family

## 2018-08-17 ENCOUNTER — Other Ambulatory Visit: Payer: Self-pay | Admitting: *Deleted

## 2018-08-17 ENCOUNTER — Other Ambulatory Visit: Payer: Self-pay | Admitting: Hematology & Oncology

## 2018-08-17 DIAGNOSIS — C9000 Multiple myeloma not having achieved remission: Secondary | ICD-10-CM

## 2018-08-17 MED ORDER — LENALIDOMIDE 5 MG PO CAPS: ORAL_CAPSULE | ORAL | 0 refills | Status: DC

## 2018-08-18 ENCOUNTER — Encounter (HOSPITAL_COMMUNITY): Payer: Self-pay | Admitting: General Surgery

## 2018-08-19 DIAGNOSIS — D1801 Hemangioma of skin and subcutaneous tissue: Secondary | ICD-10-CM | POA: Diagnosis not present

## 2018-08-19 DIAGNOSIS — L570 Actinic keratosis: Secondary | ICD-10-CM | POA: Diagnosis not present

## 2018-08-19 DIAGNOSIS — D225 Melanocytic nevi of trunk: Secondary | ICD-10-CM | POA: Diagnosis not present

## 2018-08-23 NOTE — Discharge Summary (Signed)
Patient ID: Jorge [name] [id number] 63 y.o. Jun 05, 1956  08/13/2018  Discharge date and time: 08/14/2018   Admitting Physician: Edward Jolly  Discharge Physician: Edward Jolly  Admission Diagnoses: large hiatal hernia  Discharge Diagnoses: Large hiatal hernia with GERD  Operations: Procedure(s): LAPAROSCOPIC REPAIR OF HIATAL HERNIA REPAIR WITH FUNDOPLICATION AND INSERTION OF MESH  Admission Condition: good  Discharged Condition: good  Indication for Admission: Patien presents with large hiatal hernia and Cameron erosions and GI bleeding from his upper stomach. Mild GERD.  Hemoglobin has now stabilized and after extensive preoperative work-up and discussion detailed elsewhere we have elected to proceed with elective repair of his large hiatal hernia and fundoplication.  Hospital Course: On the morning of admission the patient underwent laparoscopic repair of a very large sliding-type hiatal hernia with biologic tissue reinforcement and fundoplication.  Tolerated the procedure well.  There were no complications.  The following morning he is tolerating full liquids well.  Lab work unremarkable.  Vital signs stable.  He is just sore without significant pain and abdominal exam is benign.  He is felt ready for discharge.   Disposition: Home  Patient Instructions:  Allergies as of 08/14/2018   No Known Allergies     Medication List    TAKE these medications   acetaminophen 325 MG tablet Commonly known as:  TYLENOL Take 2 tablets (650 mg total) by mouth every 6 (six) hours as needed for mild pain (or Fever >/= 101). What changed:  how much to take   albuterol 108 (90 Base) MCG/ACT inhaler Commonly known as:  PROVENTIL HFA;VENTOLIN HFA Inhale 2 puffs into the lungs every 6 (six) hours as needed for wheezing or shortness of breath.   amoxicillin 500 MG capsule Commonly known as:  AMOXIL Take 1,000 mg by mouth See admin instructions. Take 1000 mg 1 hour prior to  dental work   amoxicillin-clavulanate 875-125 MG tablet Commonly known as:  AUGMENTIN Take 1 tablet by mouth 2 (two) times daily.   cetirizine 10 MG tablet Commonly known as:  ZYRTEC Take 10 mg by mouth daily as needed for allergies.   famciclovir 500 MG tablet Commonly known as:  FAMVIR TAKE 1 TABLET BY MOUTH  DAILY   fluticasone 50 MCG/ACT nasal spray Commonly known as:  FLONASE SPRAY 2 SPRAYS INTO EACH NOSTRIL EVERY DAY What changed:  See the new instructions.   lidocaine 5 % Commonly known as:  LIDODERM Place 1 patch onto the skin daily as needed (pain).   LIPO-FLAVONOID PLUS PO Take 1 tablet by mouth daily.   Magnesium 400 MG Caps Take 400 mg by mouth daily.   OVER THE COUNTER MEDICATION Take 1 capsule by mouth daily. methylcare otc supplement   oxyCODONE 5 MG immediate release tablet Commonly known as:  Oxy IR/ROXICODONE Take 1-2 tablets (5-10 mg total) by mouth every 6 (six) hours as needed for moderate pain.   pantoprazole 20 MG tablet Commonly known as:  PROTONIX Take 1 tablet (20 mg total) by mouth 2 (two) times daily. What changed:    when to take this  reasons to take this   Probiotic Caps Take 1 capsule by mouth daily.   rOPINIRole 0.25 MG tablet Commonly known as:  REQUIP TAKE 1 TABLET BY MOUTH 1 TO 3 HOURS BEFORE BED FOR 2 DAYS THEN INCREASE TO 2 TABS What changed:  See the new instructions.   sertraline 100 MG tablet Commonly known as:  ZOLOFT Take 100 mg by mouth every evening.  Vitamin D3 250 MCG (10000 UT) capsule Take 10,000 Units by mouth daily.   XARELTO 10 MG Tabs tablet Generic drug:  rivaroxaban Take 1 tablet (10 mg total) by mouth daily.   zolpidem 10 MG tablet Commonly known as:  AMBIEN Take 1 tablet (10 mg total) by mouth at bedtime as needed. What changed:    how much to take  when to take this   ZOMETA IV Inject 4 mg into the vein every 3 (three) months. Receives at [name] office.       Activity:  activity as tolerated Diet: Liquid diet for 2 weeks Wound Care: none needed  Follow-up:  With Dr. Excell Seltzer in 3 weeks.  Signed: Edward Jolly MD, FACS  08/23/2018, 10:05 AM

## 2018-08-24 ENCOUNTER — Inpatient Hospital Stay: Payer: 59

## 2018-08-24 ENCOUNTER — Other Ambulatory Visit: Payer: Self-pay

## 2018-08-24 ENCOUNTER — Encounter: Payer: Self-pay | Admitting: Hematology & Oncology

## 2018-08-24 ENCOUNTER — Inpatient Hospital Stay: Payer: 59 | Attending: Hematology & Oncology | Admitting: Hematology & Oncology

## 2018-08-24 VITALS — BP 120/81 | HR 74 | Temp 98.1°F | Resp 18 | Wt 181.0 lb

## 2018-08-24 DIAGNOSIS — D509 Iron deficiency anemia, unspecified: Secondary | ICD-10-CM | POA: Diagnosis not present

## 2018-08-24 DIAGNOSIS — Z86718 Personal history of other venous thrombosis and embolism: Secondary | ICD-10-CM | POA: Insufficient documentation

## 2018-08-24 DIAGNOSIS — Z7901 Long term (current) use of anticoagulants: Secondary | ICD-10-CM | POA: Insufficient documentation

## 2018-08-24 DIAGNOSIS — C9000 Multiple myeloma not having achieved remission: Secondary | ICD-10-CM | POA: Diagnosis not present

## 2018-08-24 DIAGNOSIS — D500 Iron deficiency anemia secondary to blood loss (chronic): Secondary | ICD-10-CM

## 2018-08-24 DIAGNOSIS — K4020 Bilateral inguinal hernia, without obstruction or gangrene, not specified as recurrent: Secondary | ICD-10-CM

## 2018-08-24 LAB — CBC WITH DIFFERENTIAL (CANCER CENTER ONLY)
ABS IMMATURE GRANULOCYTES: 0.03 10*3/uL (ref 0.00–0.07)
Basophils Absolute: 0 10*3/uL (ref 0.0–0.1)
Basophils Relative: 0 %
Eosinophils Absolute: 0.2 10*3/uL (ref 0.0–0.5)
Eosinophils Relative: 5 %
HCT: 42 % (ref 39.0–52.0)
Hemoglobin: 14.2 g/dL (ref 13.0–17.0)
Immature Granulocytes: 1 %
LYMPHS ABS: 1 10*3/uL (ref 0.7–4.0)
Lymphocytes Relative: 25 %
MCH: 32.5 pg (ref 26.0–34.0)
MCHC: 33.8 g/dL (ref 30.0–36.0)
MCV: 96.1 fL (ref 80.0–100.0)
Monocytes Absolute: 0.4 10*3/uL (ref 0.1–1.0)
Monocytes Relative: 9 %
Neutro Abs: 2.5 10*3/uL (ref 1.7–7.7)
Neutrophils Relative %: 60 %
Platelet Count: 200 10*3/uL (ref 150–400)
RBC: 4.37 MIL/uL (ref 4.22–5.81)
RDW: 16.6 % — ABNORMAL HIGH (ref 11.5–15.5)
WBC Count: 4.1 10*3/uL (ref 4.0–10.5)
nRBC: 0 % (ref 0.0–0.2)

## 2018-08-24 LAB — CMP (CANCER CENTER ONLY)
ALT: 42 U/L (ref 0–44)
AST: 28 U/L (ref 15–41)
Albumin: 4.3 g/dL (ref 3.5–5.0)
Alkaline Phosphatase: 70 U/L (ref 38–126)
Anion gap: 9 (ref 5–15)
BUN: 9 mg/dL (ref 8–23)
CO2: 26 mmol/L (ref 22–32)
CREATININE: 0.85 mg/dL (ref 0.61–1.24)
Calcium: 9.7 mg/dL (ref 8.9–10.3)
Chloride: 105 mmol/L (ref 98–111)
GFR, Est AFR Am: >60 mL/min (ref >60)
GFR, Estimated: >60 mL/min (ref >60)
Glucose, Bld: 102 mg/dL — ABNORMAL HIGH (ref 70–99)
Potassium: 4.1 mmol/L (ref 3.5–5.1)
Sodium: 140 mmol/L (ref 135–145)
Total Bilirubin: 0.3 mg/dL (ref 0.3–1.2)
Total Protein: 6.9 g/dL (ref 6.5–8.1)

## 2018-08-24 LAB — IRON AND TIBC
IRON: 64 ug/dL (ref 42–163)
Saturation Ratios: 22 % (ref 20–55)
TIBC: 288 ug/dL (ref 202–409)
UIBC: 224 ug/dL (ref 117–376)

## 2018-08-24 LAB — FERRITIN: Ferritin: 234 ng/mL (ref 24–336)

## 2018-08-24 MED ORDER — ZOLEDRONIC ACID 4 MG/100ML IV SOLN
4.0000 mg | Freq: Once | INTRAVENOUS | Status: AC
  Administered 2018-08-24: 4 mg via INTRAVENOUS
  Filled 2018-08-24: qty 100

## 2018-08-24 MED ORDER — SODIUM CHLORIDE 0.9 % IV SOLN
INTRAVENOUS | Status: DC
  Administered 2018-08-24: 09:00:00 via INTRAVENOUS
  Filled 2018-08-24: qty 250

## 2018-08-24 NOTE — Patient Instructions (Signed)

## 2018-08-24 NOTE — Progress Notes (Signed)
Hematology and Oncology Follow Up Visit  Jorge [name] [id number] 19-Nov-1955 63 y.o. 08/24/2018   Principle Diagnosis:  IgG Kappa myeloma - Hyperdiploid/+11 DVT of the LEFT and RIGHT leg  Iron deficiency anemia  Current Therapy:   Revlimid 73m po q day (21 on/7 off) - start on 04/07/2018   Zometa 4 mg IV q 3 months- next dose is 08/2018 Xarelto 10 mg PO daily IV Iron as needed   Interim History:  Jorge [name] here today with his wife for follow-up.  Everything is going quite well for him.  He underwent hiatal hernia surgery on January 31.  This was an overnight stay.  He did really well.  There were no complications from this.  [facility]  He had a business meeting out there.  He enjoyed himself.  He was there for 5 days.  I am very impressed with him and that he is undergoing a new venture.  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  Is to try to take away the stigma of immunodeficiencies.  Hopefully, this will go well and that there will be a nice response.  As far as his myeloma is concerned, this really has not been a problem.  We last saw him back in early January, there was no monoclonal spike in his blood.  His IgG level was 1063 mg/dL.  His kappa light chain was 3 mg/dL.  His appetite is doing well.  He has had no issues with bowels or bladder.  Overall, his performance status is ECOG 1.    Medications:  Allergies as of 08/24/2018   No Known Allergies     Medication List       Accurate as of August 24, 2018  8:41 AM. Always use your most recent med list.        acetaminophen 325 MG tablet Commonly known as:  TYLENOL Take 2 tablets (650 mg total) by mouth every 6 (six) hours as needed for mild pain (or Fever >/= 101).   albuterol 108 (90 Base) MCG/ACT inhaler Commonly known as:  PROVENTIL HFA;VENTOLIN HFA Inhale 2 puffs into the lungs every 6 (six) hours as needed for wheezing or  shortness of breath.   amoxicillin 500 MG capsule Commonly known as:  AMOXIL Take 1,000 mg by mouth See admin instructions. Take 1000 mg 1 hour prior to dental work   amoxicillin-clavulanate 875-125 MG tablet Commonly known as:  AUGMENTIN Take 1 tablet by mouth 2 (two) times daily.   cetirizine 10 MG tablet Commonly known as:  ZYRTEC Take 10 mg by mouth daily as needed for allergies.   famciclovir 500 MG tablet Commonly known as:  FAMVIR TAKE 1 TABLET BY MOUTH  DAILY   fluticasone 50 MCG/ACT nasal spray Commonly known as:  FLONASE SPRAY 2 SPRAYS INTO EACH NOSTRIL EVERY DAY   imiquimod 5 % cream Commonly known as:  ALDARA   lenalidomide 5 MG capsule Commonly known as:  REVLIMID TAKE 1 CAPSULE BY MOUTH  DAILY FOR 21 DAYS. THEN OFF FOR 7 DAYS.   lidocaine 5 % Commonly known as:  LIDODERM Place 1 patch onto the skin daily as needed (pain).   LIPO-FLAVONOID PLUS PO Take 1 tablet by mouth daily.   Magnesium 400 MG Caps Take 400 mg by mouth daily.   OVER THE COUNTER MEDICATION Take 1 capsule by mouth daily. methylcare otc supplement   oxyCODONE 5 MG immediate  release tablet Commonly known as:  Oxy IR/ROXICODONE Take 1-2 tablets (5-10 mg total) by mouth every 6 (six) hours as needed for moderate pain.   pantoprazole 20 MG tablet Commonly known as:  PROTONIX Take 1 tablet (20 mg total) by mouth 2 (two) times daily.   Probiotic Caps Take 1 capsule by mouth daily.   rOPINIRole 0.25 MG tablet Commonly known as:  REQUIP TAKE 1 TABLET BY MOUTH 1 TO 3 HOURS BEFORE BED FOR 2 DAYS THEN INCREASE TO 2 TABS   sertraline 100 MG tablet Commonly known as:  ZOLOFT Take 100 mg by mouth every evening.   Vitamin D3 250 MCG (10000 UT) capsule Take 10,000 Units by mouth daily.   XARELTO 10 MG Tabs tablet Generic drug:  rivaroxaban Take 1 tablet (10 mg total) by mouth daily.   zolpidem 10 MG tablet Commonly known as:  AMBIEN Take 1 tablet (10 mg total) by mouth at bedtime as  needed.   ZOMETA IV Inject 4 mg into the vein every 3 (three) months. Receives at [name] office.       Allergies: No Known Allergies  Past Medical History, Surgical history, Social history, and Family History were reviewed and updated.  Review of Systems: Review of Systems  Constitutional: Negative.   HENT: Negative.   Eyes: Negative.   Respiratory: Negative.   Cardiovascular: Negative.   Gastrointestinal: Negative.   Genitourinary: Negative.   Musculoskeletal: Positive for myalgias.  Skin: Negative.   Neurological: Negative.   Endo/Heme/Allergies: Negative.   Psychiatric/Behavioral: Negative.      Physical Exam:  weight is 181 lb (82.1 kg). His oral temperature is 98.1 F (36.7 C). His blood pressure is 120/81 and his pulse is 74. His respiration is 18 and oxygen saturation is 95%.   Wt Readings from Last 3 Encounters:  08/24/18 181 lb (82.1 kg)  08/13/18 183 lb 8 oz (83.2 kg)  08/09/18 183 lb 8 oz (83.2 kg)    Physical Exam Vitals signs reviewed.  HENT:     Head: Normocephalic and atraumatic.  Eyes:     Pupils: Pupils are equal, round, and reactive to light.  Neck:     Musculoskeletal: Normal range of motion.  Cardiovascular:     Rate and Rhythm: Normal rate and regular rhythm.     Heart sounds: Normal heart sounds.  Pulmonary:     Effort: Pulmonary effort is normal.     Breath sounds: Normal breath sounds.  Abdominal:     General: Bowel sounds are normal.     Palpations: Abdomen is soft.  Musculoskeletal: Normal range of motion.        General: No tenderness or deformity.  Lymphadenopathy:     Cervical: No cervical adenopathy.  Skin:    General: Skin is warm and dry.     Findings: No erythema or rash.  Neurological:     Mental Status: He is alert and oriented to person, place, and time.  Psychiatric:        Behavior: Behavior normal.        Thought Content: Thought content normal.        Judgment: Judgment normal.      Lab Results    Component Value Date   WBC 4.1 08/24/2018   HGB 14.2 08/24/2018   HCT 42.0 08/24/2018   MCV 96.1 08/24/2018   PLT 200 08/24/2018   Lab Results  Component Value Date   FERRITIN 224 08/11/2018   IRON 153 08/11/2018   TIBC  339 08/11/2018   UIBC 186 08/11/2018   IRONPCTSAT 45 08/11/2018   Lab Results  Component Value Date   RETICCTPCT 2.7 06/08/2018   RBC 4.37 08/24/2018   Lab Results  Component Value Date   KPAFRELGTCHN 29.7 (H) 07/20/2018   LAMBDASER 14.4 07/20/2018   KAPLAMBRATIO 2.06 (H) 07/20/2018   Lab Results  Component Value Date   IGGSERUM 1,063 07/20/2018   IGA 282 07/20/2018   IGMSERUM 32 07/20/2018   Lab Results  Component Value Date   TOTALPROTELP 7.0 07/20/2018   ALBUMINELP 4.1 07/20/2018   A1GS 0.2 07/20/2018   A2GS 0.6 07/20/2018   BETS 1.0 07/20/2018   GAMS 1.0 07/20/2018   MSPIKE Not Observed 07/20/2018   SPEI Comment 01/05/2018     Chemistry      Component Value Date/Time   NA 140 08/24/2018 0802   NA 143 06/30/2017 1049   NA 140 10/03/2016 0845   K 4.1 08/24/2018 0802   K 4.0 06/30/2017 1049   K 4.3 10/03/2016 0845   CL 105 08/24/2018 0802   CL 105 06/30/2017 1049   CO2 26 08/24/2018 0802   CO2 27 06/30/2017 1049   CO2 26 10/03/2016 0845   BUN 9 08/24/2018 0802   BUN 13 06/30/2017 1049   BUN 11.9 10/03/2016 0845   CREATININE 0.85 08/24/2018 0802   CREATININE 1.0 06/30/2017 1049   CREATININE 0.9 10/03/2016 0845      Component Value Date/Time   CALCIUM 9.7 08/24/2018 0802   CALCIUM 9.0 06/30/2017 1049   CALCIUM 9.9 10/03/2016 0845   ALKPHOS 70 08/24/2018 0802   ALKPHOS 57 06/30/2017 1049   ALKPHOS 80 10/03/2016 0845   AST 28 08/24/2018 0802   AST 28 10/03/2016 0845   ALT 42 08/24/2018 0802   ALT 43 06/30/2017 1049   ALT 26 10/03/2016 0845   BILITOT 0.3 08/24/2018 0802   BILITOT 0.48 10/03/2016 0845      Impression and Plan: Jorge [name] is a pleasant 63 yo gentleman with IgG kappa myeloma.  He ultimately underwent a  autologous stem cell transplant at [facility] in February 2018.   He goes to see the transplant doctors at [facility] this Wednesday.  I will plan to see him back myself in 6 weeks.  He will get his Zometa today.     [name], MD 2/11/20208:41 AM

## 2018-08-25 LAB — KAPPA/LAMBDA LIGHT CHAINS
Kappa free light chain: 24.6 mg/L — ABNORMAL HIGH (ref 3.3–19.4)
Kappa, lambda light chain ratio: 1.88 — ABNORMAL HIGH (ref 0.26–1.65)
Lambda free light chains: 13.1 mg/L (ref 5.7–26.3)

## 2018-08-25 LAB — PROTEIN ELECTROPHORESIS, SERUM, WITH REFLEX
A/G Ratio: 1.2 (ref 0.7–1.7)
Albumin ELP: 4 g/dL (ref 2.9–4.4)
Alpha-1-Globulin: 0.4 g/dL (ref 0.0–0.4)
Alpha-2-Globulin: 1 g/dL (ref 0.4–1.0)
BETA GLOBULIN: 1.1 g/dL (ref 0.7–1.3)
Gamma Globulin: 0.9 g/dL (ref 0.4–1.8)
Globulin, Total: 3.3 g/dL (ref 2.2–3.9)
Total Protein ELP: 7.3 g/dL (ref 6.0–8.5)

## 2018-08-25 LAB — IGG, IGA, IGM
IGA: 261 mg/dL (ref 61–437)
IgG (Immunoglobin G), Serum: 944 mg/dL (ref 700–1600)
IgM (Immunoglobulin M), Srm: 33 mg/dL (ref 20–172)

## 2018-08-30 DIAGNOSIS — Z9481 Bone marrow transplant status: Secondary | ICD-10-CM | POA: Diagnosis not present

## 2018-08-30 DIAGNOSIS — Z23 Encounter for immunization: Secondary | ICD-10-CM | POA: Diagnosis not present

## 2018-08-30 DIAGNOSIS — C9001 Multiple myeloma in remission: Secondary | ICD-10-CM | POA: Diagnosis not present

## 2018-09-17 ENCOUNTER — Ambulatory Visit: Payer: 59 | Admitting: Psychiatry

## 2018-09-17 DIAGNOSIS — F419 Anxiety disorder, unspecified: Secondary | ICD-10-CM | POA: Diagnosis not present

## 2018-09-17 NOTE — Progress Notes (Signed)
[facility]  Patient ID: Jorge [name],  MRN: [id number]  PCP: Elise Benne  Date of Evaluation: 09/17/2018 Time spent:  Chief Complaint:   HISTORY/CURRENT STATUS: HPI  Individual Medical History/ Review of Systems: Changes? :No   Allergies: Patient has no known allergies.  Current Medications:  Current Outpatient Medications:  .  acetaminophen (TYLENOL) 325 MG tablet, Take 2 tablets (650 mg total) by mouth every 6 (six) hours as needed for mild pain (or Fever >/= 101). (Patient taking differently: Take 325 mg by mouth every 6 (six) hours as needed for mild pain (or Fever >/= 101). ), Disp: , Rfl:  .  albuterol (PROVENTIL HFA;VENTOLIN HFA) 108 (90 Base) MCG/ACT inhaler, Inhale 2 puffs into the lungs every 6 (six) hours as needed for wheezing or shortness of breath., Disp: 1 Inhaler, Rfl: 2 .  amoxicillin (AMOXIL) 500 MG capsule, Take 1,000 mg by mouth See admin instructions. Take 1000 mg 1 hour prior to dental work, Disp: , Rfl:  .  amoxicillin-clavulanate (AUGMENTIN) 875-125 MG tablet, Take 1 tablet by mouth 2 (two) times daily. (Patient not taking: Reported on 07/28/2018), Disp: 30 tablet, Rfl: 0 .  cetirizine (ZYRTEC) 10 MG tablet, Take 10 mg by mouth daily as needed for allergies., Disp: , Rfl:  .  Cholecalciferol (VITAMIN D3) 250 MCG (10000 UT) capsule, Take 10,000 Units by mouth daily., Disp: , Rfl:  .  famciclovir (FAMVIR) 500 MG tablet, TAKE 1 TABLET BY MOUTH  DAILY, Disp: 90 tablet, Rfl: 0 .  fluticasone (FLONASE) 50 MCG/ACT nasal spray, SPRAY 2 SPRAYS INTO EACH NOSTRIL EVERY DAY (Patient taking differently: Place 2 sprays into both nostrils daily. ), Disp: 16 g, Rfl: 1 .  imiquimod (ALDARA) 5 % cream, , Disp: , Rfl:  .  lenalidomide (REVLIMID) 5 MG capsule, TAKE 1 CAPSULE BY MOUTH  DAILY FOR 21 DAYS. THEN OFF FOR 7 DAYS., Disp: 21 capsule, Rfl: 0 .  lidocaine (LIDODERM) 5 %, Place 1 patch onto the skin daily as needed (pain). , Disp: , Rfl: 2 .  Magnesium  400 MG CAPS, Take 400 mg by mouth daily., Disp: , Rfl:  .  OVER THE COUNTER MEDICATION, Take 1 capsule by mouth daily. methylcare otc supplement, Disp: , Rfl:  .  oxyCODONE (OXY IR/ROXICODONE) 5 MG immediate release tablet, Take 1-2 tablets (5-10 mg total) by mouth every 6 (six) hours as needed for moderate pain., Disp: 20 tablet, Rfl: 0 .  pantoprazole (PROTONIX) 20 MG tablet, Take 1 tablet (20 mg total) by mouth 2 (two) times daily. (Patient taking differently: Take 20 mg by mouth daily as needed for heartburn. ), Disp: 90 tablet, Rfl: 6 .  Probiotic CAPS, Take 1 capsule by mouth daily., Disp: , Rfl:  .  rOPINIRole (REQUIP) 0.25 MG tablet, TAKE 1 TABLET BY MOUTH 1 TO 3 HOURS BEFORE BED FOR 2 DAYS THEN INCREASE TO 2 TABS (Patient taking differently: Take 0.5 mg by mouth at bedtime as needed (restless legs). ), Disp: 60 tablet, Rfl: 2 .  sertraline (ZOLOFT) 100 MG tablet, Take 100 mg by mouth every evening. , Disp: , Rfl: 2 .  Vitamins-Lipotropics (LIPO-FLAVONOID PLUS PO), Take 1 tablet by mouth daily., Disp: , Rfl:  .  XARELTO 10 MG TABS tablet, Take 1 tablet (10 mg total) by mouth daily. (Patient not taking: Reported on 08/09/2018), Disp: 30 tablet, Rfl: 12 .  Zoledronic Acid (ZOMETA IV), Inject 4 mg into the vein every 3 (three) months. Receives at [name] office.,  Disp: , Rfl:  .  zolpidem (AMBIEN) 10 MG tablet, Take 1 tablet (10 mg total) by mouth at bedtime as needed. (Patient taking differently: Take 5 mg by mouth at bedtime. ), Disp: 30 tablet, Rfl: 3  Current Facility-Administered Medications:  .  0.9 %  sodium chloride infusion, 500 mL, Intravenous, Once, Cirigliano, Vito V, DO Medication Side Effects: none  Family Medical/ Social History: Changes? No  MENTAL HEALTH EXAM:  There were no vitals taken for this visit.There is no height or weight on file to calculate BMI.  General Appearance: Casual  Eye Contact:  Good  Speech:  Clear and Coherent  Volume:  Normal  Mood:  Euthymic   Affect:  Appropriate  Thought Process:  Linear  Orientation:  Full (Time, Place, and Person)  Thought Content: Logical   Suicidal Thoughts:  No  Homicidal Thoughts:  No  Memory:  WNL  Judgement:  Good  Insight:  Good  Psychomotor Activity:  Normal  Concentration:  Concentration: Good  Recall:  Good  Fund of Knowledge: Good  Language: Good  Assets:  Desire for Improvement  ADL's:  Intact  Cognition: WNL  Prognosis:  Good    DIAGNOSES: mood disorder  Receiving Psychotherapy: No    RECOMMENDATIONS: Prior visit wss 919.  She was doing well.[employer], PA-C  Contijous to do well Recheclk appropriared autoparts

## 2018-10-01 ENCOUNTER — Other Ambulatory Visit: Payer: Self-pay | Admitting: Hematology & Oncology

## 2018-10-01 DIAGNOSIS — C9000 Multiple myeloma not having achieved remission: Secondary | ICD-10-CM

## 2018-10-04 ENCOUNTER — Other Ambulatory Visit: Payer: Self-pay | Admitting: Psychiatry

## 2018-10-04 DIAGNOSIS — G8929 Other chronic pain: Principal | ICD-10-CM

## 2018-10-04 DIAGNOSIS — G4701 Insomnia due to medical condition: Secondary | ICD-10-CM

## 2018-10-05 ENCOUNTER — Other Ambulatory Visit: Payer: Self-pay

## 2018-10-05 ENCOUNTER — Inpatient Hospital Stay: Payer: 59

## 2018-10-05 ENCOUNTER — Telehealth: Payer: Self-pay | Admitting: Gastroenterology

## 2018-10-05 ENCOUNTER — Inpatient Hospital Stay: Payer: 59 | Attending: Hematology & Oncology | Admitting: Hematology & Oncology

## 2018-10-05 ENCOUNTER — Ambulatory Visit: Payer: 59

## 2018-10-05 VITALS — BP 126/82 | HR 61 | Temp 98.4°F | Resp 18 | Wt 173.2 lb

## 2018-10-05 DIAGNOSIS — R1310 Dysphagia, unspecified: Secondary | ICD-10-CM

## 2018-10-05 DIAGNOSIS — C9000 Multiple myeloma not having achieved remission: Secondary | ICD-10-CM

## 2018-10-05 DIAGNOSIS — Z9484 Stem cells transplant status: Secondary | ICD-10-CM | POA: Diagnosis not present

## 2018-10-05 DIAGNOSIS — D509 Iron deficiency anemia, unspecified: Secondary | ICD-10-CM

## 2018-10-05 DIAGNOSIS — Z79899 Other long term (current) drug therapy: Secondary | ICD-10-CM | POA: Diagnosis not present

## 2018-10-05 DIAGNOSIS — Z7901 Long term (current) use of anticoagulants: Secondary | ICD-10-CM | POA: Diagnosis not present

## 2018-10-05 DIAGNOSIS — K449 Diaphragmatic hernia without obstruction or gangrene: Secondary | ICD-10-CM | POA: Diagnosis not present

## 2018-10-05 DIAGNOSIS — D500 Iron deficiency anemia secondary to blood loss (chronic): Secondary | ICD-10-CM

## 2018-10-05 DIAGNOSIS — M791 Myalgia, unspecified site: Secondary | ICD-10-CM | POA: Diagnosis not present

## 2018-10-05 DIAGNOSIS — Z86718 Personal history of other venous thrombosis and embolism: Secondary | ICD-10-CM

## 2018-10-05 DIAGNOSIS — K4020 Bilateral inguinal hernia, without obstruction or gangrene, not specified as recurrent: Secondary | ICD-10-CM

## 2018-10-05 LAB — CMP (CANCER CENTER ONLY)
ALT: 42 U/L (ref 0–44)
AST: 34 U/L (ref 15–41)
Albumin: 4.9 g/dL (ref 3.5–5.0)
Alkaline Phosphatase: 53 U/L (ref 38–126)
Anion gap: 9 (ref 5–15)
BILIRUBIN TOTAL: 0.8 mg/dL (ref 0.3–1.2)
BUN: 10 mg/dL (ref 8–23)
CO2: 29 mmol/L (ref 22–32)
Calcium: 9.6 mg/dL (ref 8.9–10.3)
Chloride: 105 mmol/L (ref 98–111)
Creatinine: 0.84 mg/dL (ref 0.61–1.24)
GFR, Est AFR Am: >60 mL/min (ref >60)
GFR, Estimated: >60 mL/min (ref >60)
Glucose, Bld: 116 mg/dL — ABNORMAL HIGH (ref 70–99)
POTASSIUM: 4.3 mmol/L (ref 3.5–5.1)
Sodium: 143 mmol/L (ref 135–145)
TOTAL PROTEIN: 7.1 g/dL (ref 6.5–8.1)

## 2018-10-05 LAB — FERRITIN: Ferritin: 85 ng/mL (ref 24–336)

## 2018-10-05 LAB — CBC WITH DIFFERENTIAL (CANCER CENTER ONLY)
Abs Immature Granulocytes: 0.02 10*3/uL (ref 0.00–0.07)
BASOS ABS: 0 10*3/uL (ref 0.0–0.1)
Basophils Relative: 1 %
Eosinophils Absolute: 0.3 10*3/uL (ref 0.0–0.5)
Eosinophils Relative: 9 %
HCT: 47.4 % (ref 39.0–52.0)
Hemoglobin: 15.8 g/dL (ref 13.0–17.0)
Immature Granulocytes: 1 %
LYMPHS PCT: 36 %
Lymphs Abs: 1.3 10*3/uL (ref 0.7–4.0)
MCH: 33.3 pg (ref 26.0–34.0)
MCHC: 33.3 g/dL (ref 30.0–36.0)
MCV: 100 fL (ref 80.0–100.0)
Monocytes Absolute: 0.6 10*3/uL (ref 0.1–1.0)
Monocytes Relative: 16 %
NEUTROS ABS: 1.3 10*3/uL — AB (ref 1.7–7.7)
Neutrophils Relative %: 37 %
Platelet Count: 126 10*3/uL — ABNORMAL LOW (ref 150–400)
RBC: 4.74 MIL/uL (ref 4.22–5.81)
RDW: 15.3 % (ref 11.5–15.5)
WBC Count: 3.5 10*3/uL — ABNORMAL LOW (ref 4.0–10.5)
nRBC: 0 % (ref 0.0–0.2)

## 2018-10-05 LAB — IRON AND TIBC
Iron: 126 ug/dL (ref 42–163)
SATURATION RATIOS: 33 % (ref 20–55)
TIBC: 379 ug/dL (ref 202–409)
UIBC: 253 ug/dL (ref 117–376)

## 2018-10-05 NOTE — Telephone Encounter (Signed)
-----   Message from Wilford Corner sent at 10/05/2018  9:56 AM EDT ----- Regarding: Phone Msg Hello Roselyn Reef from [facility] in [place] called in and stated that Dr.Ennever would like to speak with you regarding the pt. The number he can be reached at [phone]

## 2018-10-05 NOTE — Progress Notes (Signed)
Hematology and Oncology Follow Up Visit  Jorge [name] [id number] [birth year] 63 y.o. 10/05/2018   Principle Diagnosis:  IgG Kappa myeloma - Hyperdiploid/+11 DVT of the LEFT and RIGHT leg  Iron deficiency anemia  Current Therapy:   Revlimid 57m po q day (21 on/7 off) - start on 04/07/2018   Zometa 4 mg IV q 3 months- next dose is 11/2018 Xarelto 10 mg PO daily IV Iron as needed   Interim History:  Jorge [name] here today for follow-up.  He actually is doing quite well.  [facility]  [facility] are very impressed with how well he has done.  He is doing well on the Revlimid.  He is on a reduced dose of Revlimid.  The 21 days on and 7 days off is working well for him.  His last myeloma levels did not show a monoclonal spike.  His IgG level was 144 mg/dL.  His kappa light chain was 2.5 mg/dL.  [place]  [place] was go in April of right around Easter.  [place] in [place].  He and his wife will be grandparents again.  Their oldest daughter is having a baby girl in August.  Also happy for him.  He is doing a lot of walking.  The real problem that he is having is dysphagia.  He had hiatal hernia surgery back in January.  He is having problems with solids.  He cannot eat solids.  We will have to see if gastroenterology can see him again and probably scope him and see if he has a stricture that needs to be dilated.  Overall, his performance status is ECOG 1.    Medications:  Allergies as of 10/05/2018   No Known Allergies     Medication List       Accurate as of October 05, 2018  9:11 AM. Always use your most recent med list.        acetaminophen 325 MG tablet Commonly known as:  TYLENOL Take 2 tablets (650 mg total) by mouth every 6 (six) hours as needed for mild pain (or Fever >/= 101).   albuterol 108 (90 Base) MCG/ACT inhaler Commonly known as:  PROVENTIL HFA;VENTOLIN HFA Inhale 2  puffs into the lungs every 6 (six) hours as needed for wheezing or shortness of breath.   amoxicillin 500 MG capsule Commonly known as:  AMOXIL Take 1,000 mg by mouth See admin instructions. Take 1000 mg 1 hour prior to dental work   amoxicillin-clavulanate 875-125 MG tablet Commonly known as:  AUGMENTIN Take 1 tablet by mouth 2 (two) times daily.   cetirizine 10 MG tablet Commonly known as:  ZYRTEC Take 10 mg by mouth daily as needed for allergies.   famciclovir 500 MG tablet Commonly known as:  FAMVIR TAKE 1 TABLET BY MOUTH  DAILY   fluticasone 50 MCG/ACT nasal spray Commonly known as:  FLONASE SPRAY 2 SPRAYS INTO EACH NOSTRIL EVERY DAY   imiquimod 5 % cream Commonly known as:  ALDARA   lenalidomide 5 MG capsule Commonly known as:  Revlimid TAKE 1 CAPSULE BY MOUTH  DAILY FOR 21 DAYS ON, THEN  7 DAYS OFF   lidocaine 5 % Commonly known as:  LIDODERM Place 1 patch onto the skin daily as needed (pain).   LIPO-FLAVONOID PLUS PO Take 1 tablet by mouth daily.   Magnesium 400 MG Caps Take 400 mg by mouth  daily.   OVER THE COUNTER MEDICATION Take 1 capsule by mouth daily. methylcare otc supplement   oxyCODONE 5 MG immediate release tablet Commonly known as:  Oxy IR/ROXICODONE Take 1-2 tablets (5-10 mg total) by mouth every 6 (six) hours as needed for moderate pain.   pantoprazole 20 MG tablet Commonly known as:  Protonix Take 1 tablet (20 mg total) by mouth 2 (two) times daily.   Probiotic Caps Take 1 capsule by mouth daily.   rOPINIRole 0.25 MG tablet Commonly known as:  REQUIP TAKE 1 TABLET BY MOUTH 1 TO 3 HOURS BEFORE BED FOR 2 DAYS THEN INCREASE TO 2 TABS   sertraline 100 MG tablet Commonly known as:  ZOLOFT Take 100 mg by mouth every evening.   Vitamin D3 250 MCG (10000 UT) capsule Take 10,000 Units by mouth daily.   Xarelto 10 MG Tabs tablet Generic drug:  rivaroxaban Take 1 tablet (10 mg total) by mouth daily.   zolpidem 10 MG tablet Commonly known  as:  AMBIEN Take 1 tablet (10 mg total) by mouth at bedtime as needed.   ZOMETA IV Inject 4 mg into the vein every 3 (three) months. Receives at [name] office.       Allergies: No Known Allergies  Past Medical History, Surgical history, Social history, and Family History were reviewed and updated.  Review of Systems: Review of Systems  Constitutional: Negative.   HENT: Negative.   Eyes: Negative.   Respiratory: Negative.   Cardiovascular: Negative.   Gastrointestinal: Negative.   Genitourinary: Negative.   Musculoskeletal: Positive for myalgias.  Skin: Negative.   Neurological: Negative.   Endo/Heme/Allergies: Negative.   Psychiatric/Behavioral: Negative.      Physical Exam:  weight is 173 lb 4 oz (78.6 kg). His oral temperature is 98.4 F (36.9 C). His blood pressure is 126/82 and his pulse is 61. His respiration is 18 and oxygen saturation is 100%.   Wt Readings from Last 3 Encounters:  10/05/18 173 lb 4 oz (78.6 kg)  08/24/18 181 lb (82.1 kg)  08/13/18 183 lb 8 oz (83.2 kg)    Physical Exam Vitals signs reviewed.  HENT:     Head: Normocephalic and atraumatic.  Eyes:     Pupils: Pupils are equal, round, and reactive to light.  Neck:     Musculoskeletal: Normal range of motion.  Cardiovascular:     Rate and Rhythm: Normal rate and regular rhythm.     Heart sounds: Normal heart sounds.  Pulmonary:     Effort: Pulmonary effort is normal.     Breath sounds: Normal breath sounds.  Abdominal:     General: Bowel sounds are normal.     Palpations: Abdomen is soft.  Musculoskeletal: Normal range of motion.        General: No tenderness or deformity.  Lymphadenopathy:     Cervical: No cervical adenopathy.  Skin:    General: Skin is warm and dry.     Findings: No erythema or rash.  Neurological:     Mental Status: He is alert and oriented to person, place, and time.  Psychiatric:        Behavior: Behavior normal.        Thought Content: Thought  content normal.        Judgment: Judgment normal.      Lab Results  Component Value Date   WBC 3.5 (L) 10/05/2018   HGB 15.8 10/05/2018   HCT 47.4 10/05/2018   MCV 100.0 10/05/2018  PLT 126 (L) 10/05/2018   Lab Results  Component Value Date   FERRITIN 234 08/24/2018   IRON 64 08/24/2018   TIBC 288 08/24/2018   UIBC 224 08/24/2018   IRONPCTSAT 22 08/24/2018   Lab Results  Component Value Date   RETICCTPCT 2.7 06/08/2018   RBC 4.74 10/05/2018   Lab Results  Component Value Date   KPAFRELGTCHN 24.6 (H) 08/24/2018   LAMBDASER 13.1 08/24/2018   KAPLAMBRATIO 1.88 (H) 08/24/2018   Lab Results  Component Value Date   IGGSERUM 944 08/24/2018   IGA 261 08/24/2018   IGMSERUM 33 08/24/2018   Lab Results  Component Value Date   TOTALPROTELP 7.3 08/24/2018   ALBUMINELP 4.0 08/24/2018   A1GS 0.4 08/24/2018   A2GS 1.0 08/24/2018   BETS 1.1 08/24/2018   GAMS 0.9 08/24/2018   MSPIKE Not Observed 08/24/2018   SPEI Comment 01/05/2018     Chemistry      Component Value Date/Time   NA 143 10/05/2018 0826   NA 143 06/30/2017 1049   NA 140 10/03/2016 0845   K 4.3 10/05/2018 0826   K 4.0 06/30/2017 1049   K 4.3 10/03/2016 0845   CL 105 10/05/2018 0826   CL 105 06/30/2017 1049   CO2 29 10/05/2018 0826   CO2 27 06/30/2017 1049   CO2 26 10/03/2016 0845   BUN 10 10/05/2018 0826   BUN 13 06/30/2017 1049   BUN 11.9 10/03/2016 0845   CREATININE 0.84 10/05/2018 0826   CREATININE 1.0 06/30/2017 1049   CREATININE 0.9 10/03/2016 0845      Component Value Date/Time   CALCIUM 9.6 10/05/2018 0826   CALCIUM 9.0 06/30/2017 1049   CALCIUM 9.9 10/03/2016 0845   ALKPHOS 53 10/05/2018 0826   ALKPHOS 57 06/30/2017 1049   ALKPHOS 80 10/03/2016 0845   AST 34 10/05/2018 0826   AST 28 10/03/2016 0845   ALT 42 10/05/2018 0826   ALT 43 06/30/2017 1049   ALT 26 10/03/2016 0845   BILITOT 0.8 10/05/2018 0826   BILITOT 0.48 10/03/2016 0845      Impression and Plan: Mr. Ingwersen is a  pleasant 63 yo gentleman with IgG kappa myeloma.  He ultimately underwent a autologous stem cell transplant at [facility] in February 2018.   I am so happy that he is now 2 years out from his transplant.  He is still doing well on the low-dose Revlimid.  We will see about gastroenterology to see him and do an upper endoscopy for this dysphagia.  I will plan to see him back myself in 6 weeks.       [name], MD 3/24/20209:11 AM

## 2018-10-05 NOTE — Telephone Encounter (Signed)
[name] was seen in the Oncology Clinic earlier today, and he endorsed ongoing dysphagia. I spoke with [name], and he has been having solid food dysphagia since his large [place] repair/fundoplication in January. Describes sxs in the lower sternal area, and occurring with liquids as well. Has been seen by Dr. Excell Seltzer in the interim, and discussed these ongoing sxs and was told that if sxs persist, to eval with EGD with possible dilation.  Based on his clinical description, agree that is time for EGD with possible dilation with Savary dilator.  I did discuss that based on current endoscopic limitations due to the COVID-19 pandemic, that it may be me or 1 of my partners who performs the procedure, and he is certainly agreeable to this plan.  -Will contact the endoscopy unit to set up for an expedited EGD with probable dilation this week. -Discussed the risks, benefits, alternatives and he would certainly like to proceed. -To hold Xarelto x2 days.  Will send message to Dr. Marin Olp requesting approval to hold anticoagulation therapy in the perioperative period.

## 2018-10-06 ENCOUNTER — Other Ambulatory Visit: Payer: Self-pay | Admitting: *Deleted

## 2018-10-06 ENCOUNTER — Telehealth: Payer: Self-pay | Admitting: Gastroenterology

## 2018-10-06 DIAGNOSIS — C9000 Multiple myeloma not having achieved remission: Secondary | ICD-10-CM

## 2018-10-06 DIAGNOSIS — R1310 Dysphagia, unspecified: Secondary | ICD-10-CM

## 2018-10-06 LAB — IGG, IGA, IGM
IgA: 294 mg/dL (ref 61–437)
IgG (Immunoglobin G), Serum: 1143 mg/dL (ref 700–1600)
IgM (Immunoglobulin M), Srm: 36 mg/dL (ref 20–172)

## 2018-10-06 LAB — KAPPA/LAMBDA LIGHT CHAINS
Kappa free light chain: 27.5 mg/L — ABNORMAL HIGH (ref 3.3–19.4)
Kappa, lambda light chain ratio: 1.67 — ABNORMAL HIGH (ref 0.26–1.65)
Lambda free light chains: 16.5 mg/L (ref 5.7–26.3)

## 2018-10-06 MED ORDER — LENALIDOMIDE 5 MG PO CAPS: ORAL_CAPSULE | ORAL | 0 refills | Status: DC

## 2018-10-07 ENCOUNTER — Telehealth: Payer: Self-pay

## 2018-10-07 LAB — PROTEIN ELECTROPHORESIS, SERUM, WITH REFLEX
A/G Ratio: 1.4 (ref 0.7–1.7)
ALPHA-1-GLOBULIN: 0.3 g/dL (ref 0.0–0.4)
Albumin ELP: 4.1 g/dL (ref 2.9–4.4)
Alpha-2-Globulin: 0.6 g/dL (ref 0.4–1.0)
Beta Globulin: 1.1 g/dL (ref 0.7–1.3)
Gamma Globulin: 1.1 g/dL (ref 0.4–1.8)
Globulin, Total: 3 g/dL (ref 2.2–3.9)
M-SPIKE, %: 0.2 g/dL — AB
SPEP Interpretation: 0
Total Protein ELP: 7.1 g/dL (ref 6.0–8.5)

## 2018-10-07 LAB — IMMUNOFIXATION REFLEX, SERUM
IGM (IMMUNOGLOBULIN M), SRM: 35 mg/dL (ref 20–172)
IgA: 307 mg/dL (ref 61–437)
IgG (Immunoglobin G), Serum: 1147 mg/dL (ref 700–1600)

## 2018-10-07 NOTE — Telephone Encounter (Signed)
Notified patient to hold Xarelto for 2 days prior to procedure per Dr. Marin Olp. Patient verbalized understanding of orders.

## 2018-10-07 NOTE — Telephone Encounter (Signed)
Notified patient of EGD on Friday 10/08/2018 at 8:00am. Patient verbalized understanding of instructions.

## 2018-10-07 NOTE — Telephone Encounter (Addendum)
Covid-19 travel screening questions  Have you traveled in the last 14 days? No. If yes where?  Do you now or have you had a fever in the last 14 days? No.  Do you have any respiratory symptoms of shortness of breath or cough now or in the last 14 days? No.  Do you have a medical history of Congestive Heart Failure?  Do you have a medical history of lung disease?  Do you have any family members or close contacts with diagnosed or suspected Covid-19? No.      Left message at 10:20 with details regarding our new "No care partner in the lobby" policy, I also touched on the screening questionnaire as well. I asked him to call me back when possible so we could complete this.   Patient called back at 10:45 and we went over the "No care partner in the lobby" policy as well as the screening questions. I also explained that he would have a different doctor and he mentioned that he knew this was a possibility and he was "ok" with that. He agreed and had no further questions.

## 2018-10-07 NOTE — Telephone Encounter (Signed)
Nira Conn do you know anything about this?

## 2018-10-07 NOTE — Addendum Note (Signed)
Addended by: Herma Mering D on: 10/07/2018 09:41 AM   Modules accepted: Orders

## 2018-10-08 ENCOUNTER — Other Ambulatory Visit: Payer: Self-pay

## 2018-10-08 ENCOUNTER — Ambulatory Visit (AMBULATORY_SURGERY_CENTER): Payer: 59 | Admitting: Gastroenterology

## 2018-10-08 ENCOUNTER — Encounter: Payer: Self-pay | Admitting: Gastroenterology

## 2018-10-08 VITALS — BP 120/83 | HR 64 | Temp 99.0°F | Resp 18 | Ht 67.0 in | Wt 173.0 lb

## 2018-10-08 DIAGNOSIS — R1310 Dysphagia, unspecified: Secondary | ICD-10-CM

## 2018-10-08 DIAGNOSIS — K228 Other specified diseases of esophagus: Secondary | ICD-10-CM | POA: Diagnosis not present

## 2018-10-08 DIAGNOSIS — K219 Gastro-esophageal reflux disease without esophagitis: Secondary | ICD-10-CM

## 2018-10-08 MED ORDER — SODIUM CHLORIDE 0.9 % IV SOLN: 500.0000 mL | Freq: Once | INTRAVENOUS | Status: DC

## 2018-10-08 NOTE — Progress Notes (Signed)
To PACU, VSS. Report to [email or web address] 

## 2018-10-08 NOTE — Progress Notes (Signed)
History reviewed today 

## 2018-10-08 NOTE — Patient Instructions (Signed)
Handouts for Esophagitis and Post Dilation diet given.  YOU HAD AN ENDOSCOPIC PROCEDURE TODAY AT [facility]:   Refer to the procedure report that was given to you for any specific questions about what was found during the examination.  If the procedure report does not answer your questions, please call your gastroenterologist to clarify.  If you requested that your care partner not be given the details of your procedure findings, then the procedure report has been included in a sealed envelope for you to review at your convenience later.  YOU SHOULD EXPECT: Some feelings of bloating in the abdomen. Passage of more gas than usual.  Walking can help get rid of the air that was put into your GI tract during the procedure and reduce the bloating. If you had a lower endoscopy (such as a colonoscopy or flexible sigmoidoscopy) you may notice spotting of blood in your stool or on the toilet paper. If you underwent a bowel prep for your procedure, you may not have a normal bowel movement for a few days.  Please Note:  You might notice some irritation and congestion in your nose or some drainage.  This is from the oxygen used during your procedure.  There is no need for concern and it should clear up in a day or so.  SYMPTOMS TO REPORT IMMEDIATELY:   Following upper endoscopy (EGD)  Vomiting of blood or coffee ground material  New chest pain or pain under the shoulder blades  Painful or persistently difficult swallowing  New shortness of breath  Fever of 100F or higher  Black, tarry-looking stools  For urgent or emergent issues, a gastroenterologist can be reached at any hour by calling [phone].   DIET:  SEE HANDOUT FOR POST DILATION DIET, NPO UNTIL 930 AM CLEAR LIQUIDS UNTIL 1030 AM THEN SOFT DIET X48 HOURS.  Drink plenty of fluids but you should avoid alcoholic beverages for 24 hours.  ACTIVITY:  You should plan to take it easy for the rest of today and you should NOT DRIVE  or use heavy machinery until tomorrow (because of the sedation medicines used during the test).    FOLLOW UP: Our staff will call the number listed on your records the next business day following your procedure to check on you and address any questions or concerns that you may have regarding the information given to you following your procedure. If we do not reach you, we will leave a message.  However, if you are feeling well and you are not experiencing any problems, there is no need to return our call.  We will assume that you have returned to your regular daily activities without incident.  If any biopsies were taken you will be contacted by phone or by letter within the next 1-3 weeks.  Please call us at [phone] if you have not heard about the biopsies in 3 weeks.    SIGNATURES/CONFIDENTIALITY: You and/or your care partner have signed paperwork which will be entered into your electronic medical record.  These signatures attest to the fact that that the information above on your After Visit Summary has been reviewed and is understood.  Full responsibility of the confidentiality of this discharge information lies with you and/or your care-partner.

## 2018-10-08 NOTE — Op Note (Signed)
[facility] Patient Name: Jorge [name] Procedure Date: 10/08/2018 7:58 AM MRN: [id number] Endoscopist: Justice Britain , MD Age: 63 Referring MD:  Date of Birth: 1955-11-25 Gender: Male Account #: [id number] Procedure:                Upper GI endoscopy Indications:              Dysphagia, Assessment following Hiatal Hernia                            Repair and Fundoplication Medicines:                Monitored Anesthesia Care Procedure:                Pre-Anesthesia Assessment:                           - Prior to the procedure, a History and Physical                            was performed, and patient medications and                            allergies were reviewed. The patient's tolerance of                            previous anesthesia was also reviewed. The risks                            and benefits of the procedure and the sedation                            options and risks were discussed with the patient.                            All questions were answered, and informed consent                            was obtained. Prior Anticoagulants: The patient has                            taken Eliquis (apixaban), last dose was 2 days                            prior to procedure. ASA Grade Assessment: III - A                            patient with severe systemic disease. After                            reviewing the risks and benefits, the patient was                            deemed in satisfactory condition to undergo the  procedure.                           After obtaining informed consent, the endoscope was                            passed under direct vision. Throughout the                            procedure, the patient's blood pressure, pulse, and                            oxygen saturations were monitored continuously. The                            Model GIF-HQ190 [id number]) scope was introduced              through the mouth, and advanced to the second part                            of duodenum. The upper GI endoscopy was                            accomplished without difficulty. The patient                            tolerated the procedure. No crepitus was noted at                            the completion of the procedure. Scope In: Scope Out: Findings:                 White nummular lesions were noted in the entire                            esophagus. Biopsies were taken with a cold forceps                            for histology to rule out EoE and Candida.                           A small area of extrinsic impression without overt                            stricture/ring was found in the distal esophagus                            approximately 1-2 cm from the GE Junction/Z-line.                            There was a small pop as the scope itself traversed                            the region. A guidewire was placed and the scope  was withdrawn. Dilation was performed with a Savary                            dilator with no resistance at 12 mm, mild                            resistance at 13 mm and 14 mm, moderate resistance                            at 15 mm and severe resistance at 16 mm. The                            dilation site was examined following endoscope                            reinsertion and showed mild improvement in luminal                            narrowing and no perforation.                           The Z-line was regular and was found 40 cm from the                            incisors.                           Evidence of a prior partial fundoplication was                            found in the cardia and in the gastric fundus. This                            was characterized by healthy appearing mucosa.                           No other gross lesions were noted in the entire                             examined stomach.                           No gross lesions were noted in the duodenal bulb,                            in the first portion of the duodenum and in the                            second portion of the duodenum. Complications:            No immediate complications. Estimated Blood Loss:     Estimated blood loss was minimal. Impression:               - White nummular lesions in esophageal mucosa.  Biopsied to rule out EoE and Candida.                           - Extrinsic impression 1-2 cm above the GE                            Junction/Z-line without overt stricture noted.                            Dilated.                           - Z-line regular, 40 cm from the incisors.                           - A partial fundoplication was found, characterized                            by healthy appearing mucosa.                           - No other gross lesions in the stomach.                           - No gross lesions in the duodenal bulb, in the                            first portion of the duodenum and in the second                            portion of the duodenum. Recommendation:           - The patient will be observed post-procedure,                            until all discharge criteria are met.                           - Discharge patient to home.                           - Patient has a contact number available for                            emergencies. The signs and symptoms of potential                            delayed complications were discussed with the                            patient. Return to normal activities tomorrow.                            Written discharge instructions were provided to the  patient.                           - Soft diet for 2 days.                           - Restart Xarelto in 48 hours to decrease risk of                            post-procedural bleeding  complications.                           - Repeat upper endoscopy PRN for retreatment.                           - The findings and recommendations were discussed                            with the patient.                           - The findings and recommendations were discussed                            with the referring physician. Justice Britain, MD 10/08/2018 8:30:45 AM

## 2018-10-08 NOTE — Progress Notes (Signed)
Called to room to assist during endoscopic procedure.  Patient ID and intended procedure confirmed with present staff. Received instructions for my participation in the procedure from the performing physician.  

## 2018-10-11 ENCOUNTER — Telehealth: Payer: Self-pay | Admitting: *Deleted

## 2018-10-11 NOTE — Telephone Encounter (Signed)
  Follow up Call-  Call back number 10/08/2018 05/26/2018  Post procedure Call Back phone  # [phone]  Permission to leave phone message Yes Yes  Some recent data might be hidden     Patient questions:  Do you have a fever, pain , or abdominal swelling? No. Pain Score  0 *  Have you tolerated food without any problems? Yes.    Have you been able to return to your normal activities? Yes.    Do you have any questions about your discharge instructions: Diet   No. Medications  No. Follow up visit  No.  Do you have questions or concerns about your Care? No.  Actions: * If pain score is 4 or above: No action needed, pain <4.  Pt had several questions regarding pathology- answered and he has no further questions at this time.

## 2018-10-11 NOTE — Telephone Encounter (Signed)
  Follow up Call-  Call back number 10/08/2018 05/26/2018  Post procedure Call Back phone  # [phone]  Permission to leave phone message Yes Yes  Some recent data might be hidden    [facility]

## 2018-10-11 NOTE — Telephone Encounter (Signed)
Pt is returning your call and is asking to speak with a nurse about some symptoms he experienced over the weekend  #[phone]

## 2018-10-12 ENCOUNTER — Other Ambulatory Visit: Payer: Self-pay | Admitting: Medical

## 2018-10-14 ENCOUNTER — Encounter: Payer: Self-pay | Admitting: Hematology & Oncology

## 2018-10-15 ENCOUNTER — Encounter: Payer: Self-pay | Admitting: Gastroenterology

## 2018-10-19 ENCOUNTER — Telehealth: Payer: Self-pay | Admitting: Gastroenterology

## 2018-10-19 NOTE — Telephone Encounter (Signed)
Called Jorge [name] to follow-up after his EGD with dilation (16 mm Savary) last week. He reports significant improvement in dysphagia, now tolerating most solids with minimal issue. Still cutting food into small pieces and chewing thoroughly out of precaution, but overall reports that he is much improved. Plan to resume PO intake as tolerated, and if return of sxs, can certainly plan on repeat EGD with repeat dilation at later date. Bxs o/w w/ reflux changes. All questions answered and thankful for phone f/u call.

## 2018-10-26 NOTE — Telephone Encounter (Signed)
LMOM asking Pt to call back to schedule virtual visit.

## 2018-10-27 ENCOUNTER — Other Ambulatory Visit: Payer: Self-pay | Admitting: Hematology & Oncology

## 2018-10-27 DIAGNOSIS — C9000 Multiple myeloma not having achieved remission: Secondary | ICD-10-CM

## 2018-10-27 DIAGNOSIS — M545 Low back pain: Secondary | ICD-10-CM

## 2018-10-27 DIAGNOSIS — D472 Monoclonal gammopathy: Secondary | ICD-10-CM

## 2018-10-27 DIAGNOSIS — G8929 Other chronic pain: Secondary | ICD-10-CM

## 2018-10-28 ENCOUNTER — Other Ambulatory Visit: Payer: Self-pay | Admitting: *Deleted

## 2018-10-28 DIAGNOSIS — C9000 Multiple myeloma not having achieved remission: Secondary | ICD-10-CM

## 2018-10-28 MED ORDER — LENALIDOMIDE 5 MG PO CAPS: ORAL_CAPSULE | ORAL | 0 refills | Status: DC

## 2018-11-01 ENCOUNTER — Ambulatory Visit (INDEPENDENT_AMBULATORY_CARE_PROVIDER_SITE_OTHER): Payer: 59 | Admitting: Medical

## 2018-11-01 ENCOUNTER — Other Ambulatory Visit: Payer: Self-pay

## 2018-11-01 ENCOUNTER — Encounter: Payer: Self-pay | Admitting: Medical

## 2018-11-01 DIAGNOSIS — Z8719 Personal history of other diseases of the digestive system: Secondary | ICD-10-CM | POA: Diagnosis not present

## 2018-11-01 DIAGNOSIS — C9000 Multiple myeloma not having achieved remission: Secondary | ICD-10-CM

## 2018-11-01 DIAGNOSIS — F419 Anxiety disorder, unspecified: Secondary | ICD-10-CM

## 2018-11-01 DIAGNOSIS — K222 Esophageal obstruction: Secondary | ICD-10-CM | POA: Diagnosis not present

## 2018-11-01 NOTE — Patient Instructions (Signed)
Patient has multiple myeloma is under control and he follows up with Dr. Marin Olp regular basis.  Anxiety has been well controlled as well.  He is on sertraline 100 mg daily and sees behavioral health.  Esophageal stenosis and hiatal hernia recently treated within the past year.  He states that he is eating well now and continues with Protonix.  He does have history of allergic rhinitis and uses Flonase daily but rarely has to use Zyrtec.  Advised patient that if he has any worse allergy flares then could add medication to his regimen.  If that were to happen would probably prescribe montelukast.  Follow-up date to be determined.  Tentatively would recommend a follow-up in 6 to 8 months.

## 2018-11-01 NOTE — Progress Notes (Signed)
   Subjective:    Patient ID: Jorge [name], male    DOB: 1956/06/30, 63 y.o.   MRN: [id number]  HPI  Virtual Visit via Video Note  I connected with Jorge [name] on 11/01/18 at  2:00 PM EDT by a video enabled telemedicine application and verified that I am speaking with the correct person using two identifiers.   I discussed the limitations of evaluation and management by telemedicine and the availability of in person appointments. The patient expressed understanding and agreed to proceed.   History of Present Illness:  Pt in states he is sheltering. He will occasionally go on walks and where a mask. Pt has MS. No cough or any fever. Pt sees Dr. Marin Olp on regular basis.(last check was 08-24-2018) He has next appointment early May.  Pt has had esophagus stretched before the pandemic.  Pt had hiatal hernia. He states had surgery for this. He was on protonix before surgery and told can use afterwards for a time.  Pt has anxiety for years and takes sertaline. His mood is controlled.  Pt states his bp steady usually around 120/80.  Pt states allergies are under control. Uses flonase and zyrtec. Uses flonase more often.    Observations/Objective: General. No acute distress.  Assessment and Plan: Patient has multiple myeloma is under control and he follows up with Dr. Marin Olp regular basis.  Anxiety has been well controlled as well.  He is on sertraline 100 mg daily and sees behavioral health.  Esophageal stenosis and hiatal hernia recently treated within the past year.  He states that he is eating well now and continues with Protonix.  He does have history of allergic rhinitis and uses Flonase daily but rarely has to use Zyrtec.  Advised patient that if he has any worse allergy flares then could add medication to his regimen.  If that were to happen would probably prescribe montelukast.  Follow-up date to be determined.  Tentatively would recommend a follow-up in 6 to 8 months.   Follow Up Instructions:    I discussed the assessment and treatment plan with the patient. The patient was provided an opportunity to ask questions and all were answered. The patient agreed with the plan and demonstrated an understanding of the instructions.   The patient was advised to call back or seek an in-person evaluation if the symptoms worsen or if the condition fails to improve as anticipated.     Mackie Pai, PA-C    Review of Systems  Constitutional: Negative for chills, fatigue and fever.  Respiratory: Negative for cough, chest tightness, shortness of breath and wheezing.   Cardiovascular: Negative for chest pain and palpitations.  Gastrointestinal: Negative for abdominal pain, blood in stool, diarrhea and vomiting.  Endocrine: Negative for polydipsia and polyuria.  Musculoskeletal: Negative for back pain.  Skin: Negative for rash.  Neurological: Negative for dizziness, speech difficulty, weakness and light-headedness.  Hematological: Negative for adenopathy. Does not bruise/bleed easily.  Psychiatric/Behavioral: Negative for confusion.       Objective:   Physical Exam  No acute distress.      Assessment & Plan:

## 2018-11-02 ENCOUNTER — Other Ambulatory Visit: Payer: Self-pay | Admitting: *Deleted

## 2018-11-02 DIAGNOSIS — C9000 Multiple myeloma not having achieved remission: Secondary | ICD-10-CM

## 2018-11-02 DIAGNOSIS — M545 Low back pain, unspecified: Secondary | ICD-10-CM

## 2018-11-02 DIAGNOSIS — I341 Nonrheumatic mitral (valve) prolapse: Secondary | ICD-10-CM | POA: Diagnosis not present

## 2018-11-02 DIAGNOSIS — G8929 Other chronic pain: Secondary | ICD-10-CM

## 2018-11-02 DIAGNOSIS — D472 Monoclonal gammopathy: Secondary | ICD-10-CM

## 2018-11-02 MED ORDER — FAMCICLOVIR 500 MG PO TABS: 500.0000 mg | ORAL_TABLET | Freq: Every day | ORAL | 0 refills | Status: DC

## 2018-11-12 ENCOUNTER — Other Ambulatory Visit: Payer: Self-pay | Admitting: Hematology & Oncology

## 2018-11-12 DIAGNOSIS — G8929 Other chronic pain: Secondary | ICD-10-CM

## 2018-11-12 DIAGNOSIS — M545 Low back pain: Secondary | ICD-10-CM

## 2018-11-12 DIAGNOSIS — D472 Monoclonal gammopathy: Secondary | ICD-10-CM

## 2018-11-12 DIAGNOSIS — C9000 Multiple myeloma not having achieved remission: Secondary | ICD-10-CM

## 2018-11-22 ENCOUNTER — Other Ambulatory Visit: Payer: Self-pay

## 2018-11-22 MED ORDER — SERTRALINE HCL 100 MG PO TABS: 100.0000 mg | ORAL_TABLET | Freq: Every evening | ORAL | 2 refills | Status: DC

## 2018-11-23 ENCOUNTER — Inpatient Hospital Stay: Payer: 59

## 2018-11-23 ENCOUNTER — Other Ambulatory Visit: Payer: Self-pay

## 2018-11-23 ENCOUNTER — Other Ambulatory Visit: Payer: Self-pay | Admitting: *Deleted

## 2018-11-23 ENCOUNTER — Inpatient Hospital Stay: Payer: 59 | Attending: Hematology & Oncology | Admitting: Hematology & Oncology

## 2018-11-23 VITALS — BP 128/92 | HR 75 | Temp 98.0°F | Resp 20 | Wt 180.2 lb

## 2018-11-23 DIAGNOSIS — Z9484 Stem cells transplant status: Secondary | ICD-10-CM | POA: Insufficient documentation

## 2018-11-23 DIAGNOSIS — C9000 Multiple myeloma not having achieved remission: Secondary | ICD-10-CM | POA: Diagnosis not present

## 2018-11-23 DIAGNOSIS — M791 Myalgia, unspecified site: Secondary | ICD-10-CM | POA: Diagnosis not present

## 2018-11-23 DIAGNOSIS — R079 Chest pain, unspecified: Secondary | ICD-10-CM | POA: Insufficient documentation

## 2018-11-23 DIAGNOSIS — D509 Iron deficiency anemia, unspecified: Secondary | ICD-10-CM | POA: Diagnosis not present

## 2018-11-23 DIAGNOSIS — Z86718 Personal history of other venous thrombosis and embolism: Secondary | ICD-10-CM | POA: Diagnosis not present

## 2018-11-23 DIAGNOSIS — Z7901 Long term (current) use of anticoagulants: Secondary | ICD-10-CM | POA: Diagnosis not present

## 2018-11-23 DIAGNOSIS — D500 Iron deficiency anemia secondary to blood loss (chronic): Secondary | ICD-10-CM

## 2018-11-23 LAB — CMP (CANCER CENTER ONLY)
ALT: 36 U/L (ref 0–44)
AST: 28 U/L (ref 15–41)
Albumin: 4.2 g/dL (ref 3.5–5.0)
Alkaline Phosphatase: 62 U/L (ref 38–126)
Anion gap: 9 (ref 5–15)
BUN: 13 mg/dL (ref 8–23)
CO2: 25 mmol/L (ref 22–32)
Calcium: 9.6 mg/dL (ref 8.9–10.3)
Chloride: 108 mmol/L (ref 98–111)
Creatinine: 0.77 mg/dL (ref 0.61–1.24)
GFR, Est AFR Am: >60 mL/min (ref >60)
GFR, Estimated: >60 mL/min (ref >60)
Glucose, Bld: 116 mg/dL — ABNORMAL HIGH (ref 70–99)
Potassium: 3.9 mmol/L (ref 3.5–5.1)
Sodium: 142 mmol/L (ref 135–145)
Total Bilirubin: 0.5 mg/dL (ref 0.3–1.2)
Total Protein: 6.8 g/dL (ref 6.5–8.1)

## 2018-11-23 LAB — CBC WITH DIFFERENTIAL (CANCER CENTER ONLY)
Abs Immature Granulocytes: 0.02 10*3/uL (ref 0.00–0.07)
Basophils Absolute: 0 10*3/uL (ref 0.0–0.1)
Basophils Relative: 1 %
Eosinophils Absolute: 0.2 10*3/uL (ref 0.0–0.5)
Eosinophils Relative: 8 %
HCT: 43.9 % (ref 39.0–52.0)
Hemoglobin: 14.9 g/dL (ref 13.0–17.0)
Immature Granulocytes: 1 %
Lymphocytes Relative: 37 %
Lymphs Abs: 1 10*3/uL (ref 0.7–4.0)
MCH: 34.4 pg — ABNORMAL HIGH (ref 26.0–34.0)
MCHC: 33.9 g/dL (ref 30.0–36.0)
MCV: 101.4 fL — ABNORMAL HIGH (ref 80.0–100.0)
Monocytes Absolute: 0.5 10*3/uL (ref 0.1–1.0)
Monocytes Relative: 18 %
Neutro Abs: 0.9 10*3/uL — ABNORMAL LOW (ref 1.7–7.7)
Neutrophils Relative %: 35 %
Platelet Count: 149 10*3/uL — ABNORMAL LOW (ref 150–400)
RBC: 4.33 MIL/uL (ref 4.22–5.81)
RDW: 14.1 % (ref 11.5–15.5)
WBC Count: 2.7 10*3/uL — ABNORMAL LOW (ref 4.0–10.5)
nRBC: 0 % (ref 0.0–0.2)

## 2018-11-23 LAB — IRON AND TIBC
Iron: 51 ug/dL (ref 42–163)
Saturation Ratios: 15 % — ABNORMAL LOW (ref 20–55)
TIBC: 345 ug/dL (ref 202–409)
UIBC: 294 ug/dL (ref 117–376)

## 2018-11-23 LAB — FERRITIN: Ferritin: 112 ng/mL (ref 24–336)

## 2018-11-23 MED ORDER — SODIUM CHLORIDE 0.9 % IV SOLN
Freq: Once | INTRAVENOUS | Status: AC
  Administered 2018-11-23: 10:00:00 via INTRAVENOUS
  Filled 2018-11-23: qty 250

## 2018-11-23 MED ORDER — ZOLEDRONIC ACID 4 MG/100ML IV SOLN
4.0000 mg | Freq: Once | INTRAVENOUS | Status: AC
  Administered 2018-11-23: 4 mg via INTRAVENOUS
  Filled 2018-11-23: qty 100

## 2018-11-23 NOTE — Progress Notes (Signed)
Hematology and Oncology Follow Up Visit  Jorge [name] [id number] 06/29/1956 62 y.o. 11/23/2018   Principle Diagnosis:  IgG Kappa myeloma - Hyperdiploid/+11 DVT of the LEFT and RIGHT leg  Iron deficiency anemia  Current Therapy:   Revlimid 5mg po q day (21 on/7 off) - start on 04/07/2018   Zometa 4 mg IV q 3 months- next dose is 02/2019 Xarelto 10 mg PO daily IV Iron as needed   Interim History:  Jorge [name] is here today for follow-up.  He feels well.  He said that he had a event on Sunday.  Woke up Sunday morning with substernal chest pain.  This radiated up to his shoulders and neck.  Seem to last quite a while.  He was not associated with fever.  It was not associated with cough.  He had really no shortness of breath.  It eventually has gotten better.  Again he never has taken anything for it.  We last saw him his M spike was actually present.  The M spike was 0.2 g/dL.  His IgG level was 1143 mg/dL.  His kappa light chain was 2.8 mg/dL.    There is no fever.  He has had no change in bowel or bladder habits.  He is part of a study.  He got this from [employer].  Is looking at myeloma patients and the coronavirus.  We will see what we can do testing here today.  He has had no back issues.  He is trying to walk.  Overall, his performance status is ECOG 1.  Medications:  Allergies as of 11/23/2018   No Known Allergies     Medication List       Accurate as of Nov 23, 2018  9:32 AM. If you have any questions, ask your nurse or doctor.        acetaminophen 325 MG tablet Commonly known as:  TYLENOL Take 2 tablets (650 mg total) by mouth every 6 (six) hours as needed for mild pain (or Fever >/= 101). What changed:  how much to take   albuterol 108 (90 Base) MCG/ACT inhaler Commonly known as:  VENTOLIN HFA Inhale 2 puffs into the lungs every 6 (six) hours as needed for wheezing or shortness of breath.   amoxicillin 500 MG capsule Commonly known as:  AMOXIL  Take 1,000 mg by mouth See admin instructions. Take 1000 mg 1 hour prior to dental work   amoxicillin 500 MG tablet Commonly known as:  AMOXIL Take by mouth.   cetirizine 10 MG tablet Commonly known as:  ZYRTEC Take 10 mg by mouth daily as needed for allergies.   famciclovir 500 MG tablet Commonly known as:  FAMVIR Take 1 tablet (500 mg total) by mouth daily.   fluticasone 50 MCG/ACT nasal spray Commonly known as:  FLONASE SPRAY 2 SPRAYS INTO EACH NOSTRIL EVERY DAY What changed:  See the new instructions.   lenalidomide 5 MG capsule Commonly known as:  Revlimid TAKE 1 CAPSULE BY MOUTH  DAILY FOR 21 DAYS ON, THEN  7 DAYS OFF   lidocaine 5 % Commonly known as:  LIDODERM Place 1 patch onto the skin daily as needed (pain).   LIPO-FLAVONOID PLUS PO Take 1 tablet by mouth daily.   Magnesium 400 MG Caps Take 400 mg by mouth daily.   OVER THE COUNTER MEDICATION Take 1 capsule by mouth daily. methylcare otc supplement   oxyCODONE 5 MG immediate release tablet Commonly known as:  Oxy IR/ROXICODONE Take 1-2 tablets (  5-10 mg total) by mouth every 6 (six) hours as needed for moderate pain.   pantoprazole 20 MG tablet Commonly known as:  Protonix Take 1 tablet (20 mg total) by mouth 2 (two) times daily. What changed:    when to take this  reasons to take this   Probiotic Caps Take 1 capsule by mouth daily.   rOPINIRole 0.25 MG tablet Commonly known as:  REQUIP TAKE 1 TABLET BY MOUTH 1 TO 3 HOURS BEFORE BED FOR 2 DAYS THEN INCREASE TO 2 TABS   sertraline 100 MG tablet Commonly known as:  ZOLOFT Take 1 tablet (100 mg total) by mouth every evening.   Vitamin D3 250 MCG (10000 UT) capsule Take 10,000 Units by mouth daily.   Xarelto 10 MG Tabs tablet Generic drug:  rivaroxaban TAKE 1 TABLET BY MOUTH  DAILY   zolpidem 10 MG tablet Commonly known as:  AMBIEN TAKE 1 TABLET BY MOUTH AT BEDTIME   ZOMETA IV Inject 4 mg into the vein every 3 (three) months. Receives at  Dr. Ennever's office.       Allergies: No Known Allergies  Past Medical History, Surgical history, Social history, and Family History were reviewed and updated.  Review of Systems: Review of Systems  Constitutional: Negative.   HENT: Negative.   Eyes: Negative.   Respiratory: Negative.   Cardiovascular: Negative.   Gastrointestinal: Negative.   Genitourinary: Negative.   Musculoskeletal: Positive for myalgias.  Skin: Negative.   Neurological: Negative.   Endo/Heme/Allergies: Negative.   Psychiatric/Behavioral: Negative.      Physical Exam:  weight is 180 lb 4 oz (81.8 kg). His oral temperature is 98 F (36.7 C). His blood pressure is 128/92 (abnormal) and his pulse is 75. His respiration is 20 and oxygen saturation is 99%.   Wt Readings from Last 3 Encounters:  11/23/18 180 lb 4 oz (81.8 kg)  10/08/18 173 lb (78.5 kg)  10/05/18 173 lb 4 oz (78.6 kg)    Physical Exam Vitals signs reviewed.  HENT:     Head: Normocephalic and atraumatic.  Eyes:     Pupils: Pupils are equal, round, and reactive to light.  Neck:     Musculoskeletal: Normal range of motion.  Cardiovascular:     Rate and Rhythm: Normal rate and regular rhythm.     Heart sounds: Normal heart sounds.  Pulmonary:     Effort: Pulmonary effort is normal.     Breath sounds: Normal breath sounds.  Abdominal:     General: Bowel sounds are normal.     Palpations: Abdomen is soft.  Musculoskeletal: Normal range of motion.        General: No tenderness or deformity.  Lymphadenopathy:     Cervical: No cervical adenopathy.  Skin:    General: Skin is warm and dry.     Findings: No erythema or rash.  Neurological:     Mental Status: He is alert and oriented to person, place, and time.  Psychiatric:        Behavior: Behavior normal.        Thought Content: Thought content normal.        Judgment: Judgment normal.      Lab Results  Component Value Date   WBC 2.7 (L) 11/23/2018   HGB 14.9 11/23/2018    HCT 43.9 11/23/2018   MCV 101.4 (H) 11/23/2018   PLT 149 (L) 11/23/2018   Lab Results  Component Value Date   FERRITIN 85 10/05/2018   IRON 126   10/05/2018   TIBC 379 10/05/2018   UIBC 253 10/05/2018   IRONPCTSAT 33 10/05/2018   Lab Results  Component Value Date   RETICCTPCT 2.7 06/08/2018   RBC 4.33 11/23/2018   Lab Results  Component Value Date   KPAFRELGTCHN 27.5 (H) 10/05/2018   LAMBDASER 16.5 10/05/2018   KAPLAMBRATIO 1.67 (H) 10/05/2018   Lab Results  Component Value Date   IGGSERUM 1,143 10/05/2018   IGGSERUM 1,147 10/05/2018   IGA 294 10/05/2018   IGA 307 10/05/2018   IGMSERUM 36 10/05/2018   IGMSERUM 35 10/05/2018   Lab Results  Component Value Date   TOTALPROTELP 7.1 10/05/2018   ALBUMINELP 4.1 10/05/2018   A1GS 0.3 10/05/2018   A2GS 0.6 10/05/2018   BETS 1.1 10/05/2018   GAMS 1.1 10/05/2018   MSPIKE 0.2 (H) 10/05/2018   SPEI Comment 01/05/2018     Chemistry      Component Value Date/Time   NA 142 11/23/2018 0839   NA 143 06/30/2017 1049   NA 140 10/03/2016 0845   K 3.9 11/23/2018 0839   K 4.0 06/30/2017 1049   K 4.3 10/03/2016 0845   CL 108 11/23/2018 0839   CL 105 06/30/2017 1049   CO2 25 11/23/2018 0839   CO2 27 06/30/2017 1049   CO2 26 10/03/2016 0845   BUN 13 11/23/2018 0839   BUN 13 06/30/2017 1049   BUN 11.9 10/03/2016 0845   CREATININE 0.77 11/23/2018 0839   CREATININE 1.0 06/30/2017 1049   CREATININE 0.9 10/03/2016 0845      Component Value Date/Time   CALCIUM 9.6 11/23/2018 0839   CALCIUM 9.0 06/30/2017 1049   CALCIUM 9.9 10/03/2016 0845   ALKPHOS 62 11/23/2018 0839   ALKPHOS 57 06/30/2017 1049   ALKPHOS 80 10/03/2016 0845   AST 28 11/23/2018 0839   AST 28 10/03/2016 0845   ALT 36 11/23/2018 0839   ALT 43 06/30/2017 1049   ALT 26 10/03/2016 0845   BILITOT 0.5 11/23/2018 0839   BILITOT 0.48 10/03/2016 0845      Impression and Plan: Jorge [name] is a pleasant 64 yo gentleman with IgG kappa myeloma.  He ultimately  underwent a autologous stem cell transplant at [facility] in February 2018.   Again, we will have to see what his M spike is today.  If the M spike is higher, then I think we have to do a bone marrow biopsy on him and see what the cytogenetics are.  He will get Zometa today.  Given the M spike issue I will have him get back sooner than 6 weeks.         [name], MD 5/12/20209:32 AM

## 2018-11-23 NOTE — Patient Instructions (Signed)

## 2018-11-24 ENCOUNTER — Telehealth: Payer: Self-pay | Admitting: Hematology & Oncology

## 2018-11-24 ENCOUNTER — Other Ambulatory Visit: Payer: Self-pay | Admitting: *Deleted

## 2018-11-24 LAB — IGG, IGA, IGM
IgA: 257 mg/dL (ref 61–437)
IgG (Immunoglobin G), Serum: 1054 mg/dL (ref 603–1613)
IgM (Immunoglobulin M), Srm: 43 mg/dL (ref 20–172)

## 2018-11-24 LAB — KAPPA/LAMBDA LIGHT CHAINS
Kappa free light chain: 26.8 mg/L — ABNORMAL HIGH (ref 3.3–19.4)
Kappa, lambda light chain ratio: 2.03 — ABNORMAL HIGH (ref 0.26–1.65)
Lambda free light chains: 13.2 mg/L (ref 5.7–26.3)

## 2018-11-24 NOTE — Telephone Encounter (Signed)
LMVM for patient with date/time of appointment per 5/12 result note

## 2018-11-26 ENCOUNTER — Telehealth: Payer: Self-pay | Admitting: *Deleted

## 2018-11-26 LAB — IMMUNOFIXATION REFLEX, SERUM
IgA: 274 mg/dL (ref 61–437)
IgG (Immunoglobin G), Serum: 1084 mg/dL (ref 603–1613)
IgM (Immunoglobulin M), Srm: 53 mg/dL (ref 20–172)

## 2018-11-26 LAB — PROTEIN ELECTROPHORESIS, SERUM, WITH REFLEX
A/G Ratio: 1.4 (ref 0.7–1.7)
Albumin ELP: 3.9 g/dL (ref 2.9–4.4)
Alpha-1-Globulin: 0.3 g/dL (ref 0.0–0.4)
Alpha-2-Globulin: 0.6 g/dL (ref 0.4–1.0)
Beta Globulin: 0.9 g/dL (ref 0.7–1.3)
Gamma Globulin: 1 g/dL (ref 0.4–1.8)
Globulin, Total: 2.8 g/dL (ref 2.2–3.9)
SPEP Interpretation: 0
Total Protein ELP: 6.7 g/dL (ref 6.0–8.5)

## 2018-11-26 NOTE — Telephone Encounter (Signed)
Call received from patient requesting lab results from 11/23/18.  Results given to patient.  Pt appreciative of assistance and has no further questions at this time.

## 2018-11-29 ENCOUNTER — Inpatient Hospital Stay: Payer: 59

## 2018-11-29 ENCOUNTER — Other Ambulatory Visit: Payer: Self-pay

## 2018-11-29 VITALS — BP 138/79 | HR 68 | Temp 98.2°F | Resp 18

## 2018-11-29 DIAGNOSIS — C9000 Multiple myeloma not having achieved remission: Secondary | ICD-10-CM

## 2018-11-29 MED ORDER — SODIUM CHLORIDE 0.9 % IV SOLN
510.0000 mg | Freq: Once | INTRAVENOUS | Status: AC
  Administered 2018-11-29: 510 mg via INTRAVENOUS
  Filled 2018-11-29: qty 510

## 2018-11-29 MED ORDER — SODIUM CHLORIDE 0.9 % IV SOLN
INTRAVENOUS | Status: DC
  Administered 2018-11-29: 13:00:00 via INTRAVENOUS
  Filled 2018-11-29: qty 250

## 2018-11-29 NOTE — Patient Instructions (Signed)

## 2018-12-01 ENCOUNTER — Other Ambulatory Visit: Payer: Self-pay | Admitting: *Deleted

## 2018-12-01 DIAGNOSIS — C9000 Multiple myeloma not having achieved remission: Secondary | ICD-10-CM

## 2018-12-01 MED ORDER — LENALIDOMIDE 5 MG PO CAPS: ORAL_CAPSULE | ORAL | 0 refills | Status: DC

## 2018-12-21 ENCOUNTER — Other Ambulatory Visit: Payer: Self-pay

## 2018-12-21 ENCOUNTER — Inpatient Hospital Stay: Payer: 59 | Attending: Hematology & Oncology | Admitting: Hematology & Oncology

## 2018-12-21 ENCOUNTER — Ambulatory Visit (HOSPITAL_BASED_OUTPATIENT_CLINIC_OR_DEPARTMENT_OTHER)
Admission: RE | Admit: 2018-12-21 | Discharge: 2018-12-21 | Disposition: A | Discharge status: Home / Self Care | Payer: 59 | Source: Ambulatory Visit | Attending: Hematology & Oncology | Admitting: Hematology & Oncology

## 2018-12-21 ENCOUNTER — Encounter: Payer: Self-pay | Admitting: Hematology & Oncology

## 2018-12-21 ENCOUNTER — Inpatient Hospital Stay: Payer: 59

## 2018-12-21 VITALS — BP 131/78 | HR 70 | Temp 98.6°F | Resp 18 | Wt 179.5 lb

## 2018-12-21 DIAGNOSIS — Z79899 Other long term (current) drug therapy: Secondary | ICD-10-CM | POA: Diagnosis not present

## 2018-12-21 DIAGNOSIS — Z9484 Stem cells transplant status: Secondary | ICD-10-CM

## 2018-12-21 DIAGNOSIS — Z7901 Long term (current) use of anticoagulants: Secondary | ICD-10-CM | POA: Insufficient documentation

## 2018-12-21 DIAGNOSIS — C9001 Multiple myeloma in remission: Secondary | ICD-10-CM | POA: Diagnosis not present

## 2018-12-21 DIAGNOSIS — D509 Iron deficiency anemia, unspecified: Secondary | ICD-10-CM | POA: Diagnosis not present

## 2018-12-21 DIAGNOSIS — Z86718 Personal history of other venous thrombosis and embolism: Secondary | ICD-10-CM

## 2018-12-21 DIAGNOSIS — C9000 Multiple myeloma not having achieved remission: Secondary | ICD-10-CM

## 2018-12-21 DIAGNOSIS — R197 Diarrhea, unspecified: Secondary | ICD-10-CM | POA: Diagnosis not present

## 2018-12-21 LAB — CBC WITH DIFFERENTIAL (CANCER CENTER ONLY)
Abs Immature Granulocytes: 0.03 10*3/uL (ref 0.00–0.07)
Basophils Absolute: 0.1 10*3/uL (ref 0.0–0.1)
Basophils Relative: 2 %
Eosinophils Absolute: 0.2 10*3/uL (ref 0.0–0.5)
Eosinophils Relative: 6 %
HCT: 45.7 % (ref 39.0–52.0)
Hemoglobin: 15.8 g/dL (ref 13.0–17.0)
Immature Granulocytes: 1 %
Lymphocytes Relative: 35 %
Lymphs Abs: 1.1 10*3/uL (ref 0.7–4.0)
MCH: 34.8 pg — ABNORMAL HIGH (ref 26.0–34.0)
MCHC: 34.6 g/dL (ref 30.0–36.0)
MCV: 100.7 fL — ABNORMAL HIGH (ref 80.0–100.0)
Monocytes Absolute: 0.6 10*3/uL (ref 0.1–1.0)
Monocytes Relative: 18 %
Neutro Abs: 1.3 10*3/uL — ABNORMAL LOW (ref 1.7–7.7)
Neutrophils Relative %: 38 %
Platelet Count: 148 10*3/uL — ABNORMAL LOW (ref 150–400)
RBC: 4.54 MIL/uL (ref 4.22–5.81)
RDW: 13.9 % (ref 11.5–15.5)
WBC Count: 3.2 10*3/uL — ABNORMAL LOW (ref 4.0–10.5)
nRBC: 0 % (ref 0.0–0.2)

## 2018-12-21 LAB — CMP (CANCER CENTER ONLY)
ALT: 42 U/L (ref 0–44)
AST: 48 U/L — ABNORMAL HIGH (ref 15–41)
Albumin: 4.6 g/dL (ref 3.5–5.0)
Alkaline Phosphatase: 57 U/L (ref 38–126)
Anion gap: 9 (ref 5–15)
BUN: 12 mg/dL (ref 8–23)
CO2: 26 mmol/L (ref 22–32)
Calcium: 9.8 mg/dL (ref 8.9–10.3)
Chloride: 104 mmol/L (ref 98–111)
Creatinine: 0.86 mg/dL (ref 0.61–1.24)
GFR, Est AFR Am: >60 mL/min (ref >60)
GFR, Estimated: >60 mL/min (ref >60)
Glucose, Bld: 101 mg/dL — ABNORMAL HIGH (ref 70–99)
Potassium: 4 mmol/L (ref 3.5–5.1)
Sodium: 139 mmol/L (ref 135–145)
Total Bilirubin: 0.7 mg/dL (ref 0.3–1.2)
Total Protein: 7.1 g/dL (ref 6.5–8.1)

## 2018-12-21 MED ORDER — METRONIDAZOLE 500 MG PO TABS: 500.0000 mg | ORAL_TABLET | Freq: Three times a day (TID) | ORAL | 0 refills | Status: DC

## 2018-12-21 NOTE — Progress Notes (Signed)
Hematology and Oncology Follow Up Visit  Jorge [name] [id number] Nov 02, 1955 63 y.o. 12/21/2018   Principle Diagnosis:  IgG Kappa myeloma - Hyperdiploid/+11 DVT of the LEFT and RIGHT leg  Iron deficiency anemia  Current Therapy:   Revlimid 75m po q day (21 on/7 off) - start on 04/07/2018   Zometa 4 mg IV q 3 months- next dose is 02/2019 Xarelto 10 mg PO daily IV Iron as needed   Interim History:  Jorge [name] here today for follow-up.  Unfortunately, he fell out of bed this morning.  He hit his left shoulder.  We did do x-rays of the left shoulder.  Thankfully, there was no evidence of a fracture on the x-ray.  Also wonderful is the fact that his last M spike was also not observed.  We had a transient M spike back when we saw him in March.  Again, his normalized.  His IgG level was 1054 mg/dL..[name] His kappa light chain is 2.7 mg/dL.  [facility]  He had a wonderful time down there.  He is trying to stay active.  He is doing a lot of walking.  There is no issues with nausea or vomiting.  There is no change in bowel or bladder habits although he is having diarrhea.  He thinks this might be from the Revlimid.  I will try him on a little Flagyl to see if this will help (500 mg p.o. 3 times daily x7 days).  Of note, his second grandchild is coming in August.  This is incredibly exciting for him and his wife.    Overall, his performance status is ECOG 1.  Medications:  Allergies as of 12/21/2018   No Known Allergies     Medication List       Accurate as of December 21, 2018 12:21 PM. If you have any questions, ask your nurse or doctor.        acetaminophen 325 MG tablet Commonly known as:  TYLENOL Take 2 tablets (650 mg total) by mouth every 6 (six) hours as needed for mild pain (or Fever >/= 101). What changed:  how much to take   albuterol 108 (90 Base) MCG/ACT inhaler Commonly known as:  VENTOLIN HFA Inhale 2 puffs into the lungs  every 6 (six) hours as needed for wheezing or shortness of breath. What changed:  Another medication with the same name was removed. Continue taking this medication, and follow the directions you see here. Changed by:  [name]   amoxicillin 500 MG tablet Commonly known as:  AMOXIL Take by mouth. What changed:  Another medication with the same name was removed. Continue taking this medication, and follow the directions you see here. Changed by:  [name]   cetirizine 10 MG tablet Commonly known as:  ZYRTEC Take 10 mg by mouth daily as needed for allergies.   famciclovir 500 MG tablet Commonly known as:  FAMVIR Take 1 tablet (500 mg total) by mouth daily.   fluticasone 50 MCG/ACT nasal spray Commonly known as:  FLONASE SPRAY 2 SPRAYS INTO EACH NOSTRIL EVERY DAY What changed:    See the new instructions.  Another medication with the same name was removed. Continue taking this medication, and follow the directions you see here.   lenalidomide 5 MG capsule Commonly known as:  Revlimid TAKE 1 CAPSULE BY MOUTH  DAILY FOR 21 DAYS ON, THEN  7 DAYS OFF [facility]#[id number]  lidocaine 5 % Commonly known as:  LIDODERM Place 1 patch onto the skin daily as needed (pain).   LIPO-FLAVONOID PLUS PO Take 1 tablet by mouth daily.   Magnesium 400 MG Caps Take 400 mg by mouth daily.   OVER THE COUNTER MEDICATION Take 1 capsule by mouth daily. methylcare otc supplement   oxyCODONE 5 MG immediate release tablet Commonly known as:  Oxy IR/ROXICODONE Take 1-2 tablets (5-10 mg total) by mouth every 6 (six) hours as needed for moderate pain.   pantoprazole 20 MG tablet Commonly known as:  Protonix Take 1 tablet (20 mg total) by mouth 2 (two) times daily. What changed:    when to take this  reasons to take this   Probiotic Caps Take 1 capsule by mouth daily.   rOPINIRole 0.25 MG tablet Commonly known as:  REQUIP TAKE 1 TABLET BY MOUTH 1 TO 3 HOURS BEFORE BED FOR 2  DAYS THEN INCREASE TO 2 TABS   sertraline 100 MG tablet Commonly known as:  ZOLOFT Take 1 tablet (100 mg total) by mouth every evening.   Vitamin D3 250 MCG (10000 UT) capsule Take 10,000 Units by mouth daily.   Xarelto 10 MG Tabs tablet Generic drug:  rivaroxaban TAKE 1 TABLET BY MOUTH  DAILY   zolpidem 10 MG tablet Commonly known as:  AMBIEN TAKE 1 TABLET BY MOUTH AT BEDTIME   ZOMETA IV Inject 4 mg into the vein every 3 (three) months. Receives at [name] office.       Allergies: No Known Allergies  Past Medical History, Surgical history, Social history, and Family History were reviewed and updated.  Review of Systems: Review of Systems  Constitutional: Negative.   HENT: Negative.   Eyes: Negative.   Respiratory: Negative.   Cardiovascular: Negative.   Gastrointestinal: Negative.   Genitourinary: Negative.   Musculoskeletal: Positive for myalgias.  Skin: Negative.   Neurological: Negative.   Endo/Heme/Allergies: Negative.   Psychiatric/Behavioral: Negative.      Physical Exam:  weight is 179 lb 8 oz (81.4 kg). His oral temperature is 98.6 F (37 C). His blood pressure is 131/78 and his pulse is 70. His respiration is 18 and oxygen saturation is 99%.   Wt Readings from Last 3 Encounters:  12/21/18 179 lb 8 oz (81.4 kg)  11/23/18 180 lb 4 oz (81.8 kg)  10/08/18 173 lb (78.5 kg)    Physical Exam Vitals signs reviewed.  HENT:     Head: Normocephalic and atraumatic.  Eyes:     Pupils: Pupils are equal, round, and reactive to light.  Neck:     Musculoskeletal: Normal range of motion.  Cardiovascular:     Rate and Rhythm: Normal rate and regular rhythm.     Heart sounds: Normal heart sounds.  Pulmonary:     Effort: Pulmonary effort is normal.     Breath sounds: Normal breath sounds.  Abdominal:     General: Bowel sounds are normal.     Palpations: Abdomen is soft.  Musculoskeletal: Normal range of motion.        General: No tenderness or  deformity.  Lymphadenopathy:     Cervical: No cervical adenopathy.  Skin:    General: Skin is warm and dry.     Findings: No erythema or rash.  Neurological:     Mental Status: He is alert and oriented to person, place, and time.  Psychiatric:        Behavior: Behavior normal.  Thought Content: Thought content normal.        Judgment: Judgment normal.      Lab Results  Component Value Date   WBC 3.2 (L) 12/21/2018   HGB 15.8 12/21/2018   HCT 45.7 12/21/2018   MCV 100.7 (H) 12/21/2018   PLT 148 (L) 12/21/2018   Lab Results  Component Value Date   FERRITIN 112 11/23/2018   IRON 51 11/23/2018   TIBC 345 11/23/2018   UIBC 294 11/23/2018   IRONPCTSAT 15 (L) 11/23/2018   Lab Results  Component Value Date   RETICCTPCT 2.7 06/08/2018   RBC 4.54 12/21/2018   Lab Results  Component Value Date   KPAFRELGTCHN 26.8 (H) 11/23/2018   LAMBDASER 13.2 11/23/2018   KAPLAMBRATIO 2.03 (H) 11/23/2018   Lab Results  Component Value Date   IGGSERUM 1,054 11/23/2018   IGGSERUM 1,084 11/23/2018   IGA 257 11/23/2018   IGA 274 11/23/2018   IGMSERUM 43 11/23/2018   IGMSERUM 53 11/23/2018   Lab Results  Component Value Date   TOTALPROTELP 6.7 11/23/2018   ALBUMINELP 3.9 11/23/2018   A1GS 0.3 11/23/2018   A2GS 0.6 11/23/2018   BETS 0.9 11/23/2018   GAMS 1.0 11/23/2018   MSPIKE Not Observed 11/23/2018   SPEI Comment 01/05/2018     Chemistry      Component Value Date/Time   NA 139 12/21/2018 1115   NA 143 06/30/2017 1049   NA 140 10/03/2016 0845   K 4.0 12/21/2018 1115   K 4.0 06/30/2017 1049   K 4.3 10/03/2016 0845   CL 104 12/21/2018 1115   CL 105 06/30/2017 1049   CO2 26 12/21/2018 1115   CO2 27 06/30/2017 1049   CO2 26 10/03/2016 0845   BUN 12 12/21/2018 1115   BUN 13 06/30/2017 1049   BUN 11.9 10/03/2016 0845   CREATININE 0.86 12/21/2018 1115   CREATININE 1.0 06/30/2017 1049   CREATININE 0.9 10/03/2016 0845      Component Value Date/Time   CALCIUM 9.8  12/21/2018 1115   CALCIUM 9.0 06/30/2017 1049   CALCIUM 9.9 10/03/2016 0845   ALKPHOS 57 12/21/2018 1115   ALKPHOS 57 06/30/2017 1049   ALKPHOS 80 10/03/2016 0845   AST 48 (H) 12/21/2018 1115   AST 28 10/03/2016 0845   ALT 42 12/21/2018 1115   ALT 43 06/30/2017 1049   ALT 26 10/03/2016 0845   BILITOT 0.7 12/21/2018 1115   BILITOT 0.48 10/03/2016 0845      Impression and Plan: Mr. Forand is a pleasant 63 yo gentleman with IgG kappa myeloma.  He ultimately underwent a autologous stem cell transplant at [facility] in February 2018.   Again, thankfully he did not break his shoulder.      I will plan to get back in August.  We will give him Zometa when we see him back.  [name], MD 6/9/202012:21 PM

## 2018-12-22 ENCOUNTER — Other Ambulatory Visit: Payer: Self-pay | Admitting: Hematology & Oncology

## 2018-12-22 LAB — IRON AND TIBC
Iron: 149 ug/dL (ref 42–163)
Saturation Ratios: 44 % (ref 20–55)
TIBC: 335 ug/dL (ref 202–409)
UIBC: 186 ug/dL (ref 117–376)

## 2018-12-22 LAB — KAPPA/LAMBDA LIGHT CHAINS
Kappa free light chain: 25.8 mg/L — ABNORMAL HIGH (ref 3.3–19.4)
Kappa, lambda light chain ratio: 2.13 — ABNORMAL HIGH (ref 0.26–1.65)
Lambda free light chains: 12.1 mg/L (ref 5.7–26.3)

## 2018-12-22 LAB — IGG, IGA, IGM
IgA: 268 mg/dL (ref 61–437)
IgG (Immunoglobin G), Serum: 1017 mg/dL (ref 603–1613)
IgM (Immunoglobulin M), Srm: 33 mg/dL (ref 20–172)

## 2018-12-22 LAB — FERRITIN: Ferritin: 335 ng/mL (ref 24–336)

## 2018-12-23 LAB — PROTEIN ELECTROPHORESIS, SERUM, WITH REFLEX
A/G Ratio: 1.5 (ref 0.7–1.7)
Albumin ELP: 4.1 g/dL (ref 2.9–4.4)
Alpha-1-Globulin: 0.2 g/dL (ref 0.0–0.4)
Alpha-2-Globulin: 0.5 g/dL (ref 0.4–1.0)
Beta Globulin: 1 g/dL (ref 0.7–1.3)
Gamma Globulin: 1 g/dL (ref 0.4–1.8)
Globulin, Total: 2.8 g/dL (ref 2.2–3.9)
Total Protein ELP: 6.9 g/dL (ref 6.0–8.5)

## 2018-12-29 ENCOUNTER — Other Ambulatory Visit: Payer: Self-pay | Admitting: *Deleted

## 2018-12-29 DIAGNOSIS — C9000 Multiple myeloma not having achieved remission: Secondary | ICD-10-CM

## 2018-12-29 MED ORDER — LENALIDOMIDE 5 MG PO CAPS: ORAL_CAPSULE | ORAL | 0 refills | Status: DC

## 2019-01-19 ENCOUNTER — Other Ambulatory Visit: Payer: Self-pay | Admitting: *Deleted

## 2019-01-19 DIAGNOSIS — C9000 Multiple myeloma not having achieved remission: Secondary | ICD-10-CM

## 2019-01-19 MED ORDER — LENALIDOMIDE 5 MG PO CAPS: ORAL_CAPSULE | ORAL | 0 refills | Status: DC

## 2019-01-30 ENCOUNTER — Other Ambulatory Visit: Payer: Self-pay | Admitting: Hematology & Oncology

## 2019-01-30 DIAGNOSIS — D472 Monoclonal gammopathy: Secondary | ICD-10-CM

## 2019-01-30 DIAGNOSIS — G8929 Other chronic pain: Secondary | ICD-10-CM

## 2019-01-30 DIAGNOSIS — C9000 Multiple myeloma not having achieved remission: Secondary | ICD-10-CM

## 2019-02-01 ENCOUNTER — Inpatient Hospital Stay: Payer: 59

## 2019-02-01 ENCOUNTER — Other Ambulatory Visit: Payer: Self-pay

## 2019-02-01 ENCOUNTER — Inpatient Hospital Stay: Payer: 59 | Attending: Hematology & Oncology | Admitting: Hematology & Oncology

## 2019-02-01 ENCOUNTER — Encounter: Payer: Self-pay | Admitting: Hematology & Oncology

## 2019-02-01 VITALS — BP 127/83 | HR 67 | Temp 97.3°F | Wt 181.2 lb

## 2019-02-01 DIAGNOSIS — C9000 Multiple myeloma not having achieved remission: Secondary | ICD-10-CM

## 2019-02-01 DIAGNOSIS — C9001 Multiple myeloma in remission: Secondary | ICD-10-CM | POA: Diagnosis not present

## 2019-02-01 DIAGNOSIS — Z79899 Other long term (current) drug therapy: Secondary | ICD-10-CM | POA: Diagnosis not present

## 2019-02-01 DIAGNOSIS — M791 Myalgia, unspecified site: Secondary | ICD-10-CM | POA: Diagnosis not present

## 2019-02-01 DIAGNOSIS — D509 Iron deficiency anemia, unspecified: Secondary | ICD-10-CM

## 2019-02-01 DIAGNOSIS — Z86718 Personal history of other venous thrombosis and embolism: Secondary | ICD-10-CM | POA: Diagnosis not present

## 2019-02-01 DIAGNOSIS — D500 Iron deficiency anemia secondary to blood loss (chronic): Secondary | ICD-10-CM

## 2019-02-01 DIAGNOSIS — Z7901 Long term (current) use of anticoagulants: Secondary | ICD-10-CM | POA: Diagnosis not present

## 2019-02-01 LAB — CBC WITH DIFFERENTIAL (CANCER CENTER ONLY)
Abs Immature Granulocytes: 0.02 10*3/uL (ref 0.00–0.07)
Basophils Absolute: 0 10*3/uL (ref 0.0–0.1)
Basophils Relative: 1 %
Eosinophils Absolute: 0.2 10*3/uL (ref 0.0–0.5)
Eosinophils Relative: 8 %
HCT: 44.3 % (ref 39.0–52.0)
Hemoglobin: 15.4 g/dL (ref 13.0–17.0)
Immature Granulocytes: 1 %
Lymphocytes Relative: 35 %
Lymphs Abs: 1.1 10*3/uL (ref 0.7–4.0)
MCH: 35.1 pg — ABNORMAL HIGH (ref 26.0–34.0)
MCHC: 34.8 g/dL (ref 30.0–36.0)
MCV: 100.9 fL — ABNORMAL HIGH (ref 80.0–100.0)
Monocytes Absolute: 0.5 10*3/uL (ref 0.1–1.0)
Monocytes Relative: 14 %
Neutro Abs: 1.3 10*3/uL — ABNORMAL LOW (ref 1.7–7.7)
Neutrophils Relative %: 41 %
Platelet Count: 131 10*3/uL — ABNORMAL LOW (ref 150–400)
RBC: 4.39 MIL/uL (ref 4.22–5.81)
RDW: 13.4 % (ref 11.5–15.5)
WBC Count: 3.2 10*3/uL — ABNORMAL LOW (ref 4.0–10.5)
nRBC: 0 % (ref 0.0–0.2)

## 2019-02-01 LAB — CMP (CANCER CENTER ONLY)
ALT: 58 U/L — ABNORMAL HIGH (ref 0–44)
AST: 49 U/L — ABNORMAL HIGH (ref 15–41)
Albumin: 4.4 g/dL (ref 3.5–5.0)
Alkaline Phosphatase: 53 U/L (ref 38–126)
Anion gap: 9 (ref 5–15)
BUN: 12 mg/dL (ref 8–23)
CO2: 25 mmol/L (ref 22–32)
Calcium: 8.9 mg/dL (ref 8.9–10.3)
Chloride: 108 mmol/L (ref 98–111)
Creatinine: 0.84 mg/dL (ref 0.61–1.24)
GFR, Est AFR Am: >60 mL/min (ref >60)
GFR, Estimated: >60 mL/min (ref >60)
Glucose, Bld: 125 mg/dL — ABNORMAL HIGH (ref 70–99)
Potassium: 3.8 mmol/L (ref 3.5–5.1)
Sodium: 142 mmol/L (ref 135–145)
Total Bilirubin: 0.9 mg/dL (ref 0.3–1.2)
Total Protein: 6.7 g/dL (ref 6.5–8.1)

## 2019-02-01 MED ORDER — SODIUM CHLORIDE 0.9% FLUSH
3.0000 mL | Freq: Once | INTRAVENOUS | Status: AC | PRN
  Filled 2019-02-01: qty 10

## 2019-02-01 MED ORDER — ZOLEDRONIC ACID 4 MG/100ML IV SOLN
4.0000 mg | Freq: Once | INTRAVENOUS | Status: AC
  Administered 2019-02-01: 4 mg via INTRAVENOUS
  Filled 2019-02-01: qty 100

## 2019-02-01 MED ORDER — HEPARIN SOD (PORK) LOCK FLUSH 100 UNIT/ML IV SOLN
500.0000 [IU] | Freq: Once | INTRAVENOUS | Status: AC
  Filled 2019-02-01: qty 5

## 2019-02-01 MED ORDER — SODIUM CHLORIDE 0.9 % IV SOLN
INTRAVENOUS | Status: DC
  Administered 2019-02-01: 12:00:00 via INTRAVENOUS
  Filled 2019-02-01: qty 250

## 2019-02-01 NOTE — Patient Instructions (Signed)
Zoledronic Acid injection (Hypercalcemia, Oncology) What is this medicine? ZOLEDRONIC ACID (ZOE le dron ik AS id) lowers the amount of calcium loss from bone. It is used to treat too much calcium in your blood from cancer. It is also used to prevent complications of cancer that has spread to the bone. This medicine may be used for other purposes; ask your health care provider or pharmacist if you have questions. COMMON BRAND NAME(S): Zometa What should I tell my health care provider before I take this medicine? They need to know if you have any of these conditions:  aspirin-sensitive asthma  cancer, especially if you are receiving medicines used to treat cancer  dental disease or wear dentures  infection  kidney disease  receiving corticosteroids like dexamethasone or prednisone  an unusual or allergic reaction to zoledronic acid, other medicines, foods, dyes, or preservatives  pregnant or trying to get pregnant  breast-feeding How should I use this medicine? This medicine is for infusion into a vein. It is given by a health care professional in a hospital or clinic setting. Talk to your pediatrician regarding the use of this medicine in children. Special care may be needed. Overdosage: If you think you have taken too much of this medicine contact a poison control center or emergency room at once. NOTE: This medicine is only for you. Do not share this medicine with others. What if I miss a dose? It is important not to miss your dose. Call your doctor or health care professional if you are unable to keep an appointment. What may interact with this medicine?  certain antibiotics given by injection  NSAIDs, medicines for pain and inflammation, like ibuprofen or naproxen  some diuretics like bumetanide, furosemide  teriparatide  thalidomide This list may not describe all possible interactions. Give your health care provider a list of all the medicines, herbs, non-prescription  drugs, or dietary supplements you use. Also tell them if you smoke, drink alcohol, or use illegal drugs. Some items may interact with your medicine. What should I watch for while using this medicine? Visit your doctor or health care professional for regular checkups. It may be some time before you see the benefit from this medicine. Do not stop taking your medicine unless your doctor tells you to. Your doctor may order blood tests or other tests to see how you are doing. Women should inform their doctor if they wish to become pregnant or think they might be pregnant. There is a potential for serious side effects to an unborn child. Talk to your health care professional or pharmacist for more information. You should make sure that you get enough calcium and vitamin D while you are taking this medicine. Discuss the foods you eat and the vitamins you take with your health care professional. Some people who take this medicine have severe bone, joint, and/or muscle pain. This medicine may also increase your risk for jaw problems or a broken thigh bone. Tell your doctor right away if you have severe pain in your jaw, bones, joints, or muscles. Tell your doctor if you have any pain that does not go away or that gets worse. Tell your dentist and dental surgeon that you are taking this medicine. You should not have major dental surgery while on this medicine. See your dentist to have a dental exam and fix any dental problems before starting this medicine. Take good care of your teeth while on this medicine. Make sure you see your dentist for regular follow-up   appointments. What side effects may I notice from receiving this medicine? Side effects that you should report to your doctor or health care professional as soon as possible:  allergic reactions like skin rash, itching or hives, swelling of the face, lips, or tongue  anxiety, confusion, or depression  breathing problems  changes in vision  eye  pain  feeling faint or lightheaded, falls  jaw pain, especially after dental work  mouth sores  muscle cramps, stiffness, or weakness  redness, blistering, peeling or loosening of the skin, including inside the mouth  trouble passing urine or change in the amount of urine Side effects that usually do not require medical attention (report to your doctor or health care professional if they continue or are bothersome):  bone, joint, or muscle pain  constipation  diarrhea  fever  hair loss  irritation at site where injected  loss of appetite  nausea, vomiting  stomach upset  trouble sleeping  trouble swallowing  weak or tired This list may not describe all possible side effects. Call your doctor for medical advice about side effects. You may report side effects to FDA at 1-800-FDA-1088. Where should I keep my medicine? This drug is given in a hospital or clinic and will not be stored at home. NOTE: This sheet is a summary. It may not cover all possible information. If you have questions about this medicine, talk to your doctor, pharmacist, or health care provider.  2020 Elsevier/Gold Standard (2013-11-26 14:19:39)  

## 2019-02-01 NOTE — Progress Notes (Signed)
Hematology and Oncology Follow Up Visit  Jorge [name] [id number] 12/15/1955 63 y.Jorge. 02/01/2019   Principle Diagnosis:  IgG Kappa myeloma - Hyperdiploid/+11 DVT of the LEFT and RIGHT leg  Iron deficiency anemia  Current Therapy:   Revlimid 59m po q day (21 on/7 off) - start on 04/07/2018   Zometa 4 mg IV q 3 months- next dose is 05/2019 Xarelto 10 mg PO daily IV Iron as needed   Interim History:  [name] here today for follow-up.  His shoulders still bother him.  He had to go see his orthopedist.  He has had some injections into the right shoulder.  His second grandchild is coming in August.  It will be a granddaughter.  He and his [place] out to [name]  [place].  [name]  Thankfully, his myeloma has not been a problem.  Last time we checked his myeloma studies, everything looked fine without a monoclonal spike.  He is on Revlimid.  He is doing okay on Revlimid.  He has had some diarrhea.  He had to take some WelChol for the diarrhea.  This seems to be helping him.  I have never heard of this before but will certainly utilize this if necessary.  He has had no fever.  He has had no leg swelling.  There has been no rashes.  He always does well with IV iron.  Overall, his performance status is ECOG 1.   and his wife.     Medications:  Allergies as of 02/01/2019   No Known Allergies     Medication List       Accurate as of February 01, 2019 11:19 AM. If you have any questions, ask your nurse or doctor.        acetaminophen 325 MG tablet Commonly known as: TYLENOL Take 2 tablets (650 mg total) by mouth every 6 (six) hours as needed for mild pain (or Fever >/= 101). What changed: how much to take   albuterol 108 (90 Base) MCG/ACT inhaler Commonly known as: VENTOLIN HFA Inhale 2 puffs into the lungs every 6 (six) hours as needed for wheezing or  shortness of breath.   amoxicillin 500 MG tablet Commonly known as: AMOXIL Take by mouth.   cetirizine 10 MG tablet Commonly known as: ZYRTEC Take 10 mg by mouth daily as needed for allergies.   colesevelam 625 MG tablet Commonly known as: WELCHOL Take 625 mg by mouth 2 (two) times daily with a meal.   famciclovir 500 MG tablet Commonly known as: FAMVIR TAKE 1 TABLET BY MOUTH EVERY DAY   fluticasone 50 MCG/ACT nasal spray Commonly known as: FLONASE SPRAY 2 SPRAYS INTO EACH NOSTRIL EVERY DAY What changed: See the new instructions.   lenalidomide 5 MG capsule Commonly known as: Revlimid TAKE 1 CAPSULE BY MOUTH  DAILY FOR 21 DAYS ON, THEN  7 DAYS OFF [facility]#[id number]   lidocaine 5 % Commonly known as: LIDODERM Place 1 patch onto the skin daily as needed (pain).   LIPO-FLAVONOID PLUS PO Take 1 tablet by mouth daily.   Magnesium 400 MG Caps Take 400 mg by mouth daily.   metroNIDAZOLE 500 MG tablet Commonly known as: FLAGYL Take 1 tablet (500 mg total) by mouth 3 (three) times daily.   OVER THE COUNTER MEDICATION Take 1 capsule by mouth daily. methylcare otc supplement   oxyCODONE 5 MG immediate  release tablet Commonly known as: Oxy IR/ROXICODONE Take 1-2 tablets (5-10 mg total) by mouth every 6 (six) hours as needed for moderate pain.   pantoprazole 20 MG tablet Commonly known as: Protonix Take 1 tablet (20 mg total) by mouth 2 (two) times daily. What changed:   when to take this  reasons to take this   Probiotic Caps Take 1 capsule by mouth daily.   rOPINIRole 0.25 MG tablet Commonly known as: REQUIP TAKE 1 TABLET BY MOUTH 1 TO 3 HOURS BEFORE BED FOR 2 DAYS THEN INCREASE TO 2 TABS   sertraline 100 MG tablet Commonly known as: ZOLOFT Take 1 tablet (100 mg total) by mouth every evening.   Vitamin D3 250 MCG (10000 UT) capsule Take 10,000 Units by mouth daily.   Xarelto 10 MG Tabs tablet Generic drug: rivaroxaban TAKE 1 TABLET BY MOUTH  DAILY   zolpidem  10 MG tablet Commonly known as: AMBIEN TAKE 1 TABLET BY MOUTH AT BEDTIME   ZOMETA IV Inject 4 mg into the vein every 3 (three) months. Receives at [name] office.       Allergies: No Known Allergies  Past Medical History, Surgical history, Social history, and Family History were reviewed and updated.  Review of Systems: Review of Systems  Constitutional: Negative.   HENT: Negative.   Eyes: Negative.   Respiratory: Negative.   Cardiovascular: Negative.   Gastrointestinal: Negative.   Genitourinary: Negative.   Musculoskeletal: Positive for myalgias.  Skin: Negative.   Neurological: Negative.   Endo/Heme/Allergies: Negative.   Psychiatric/Behavioral: Negative.      Physical Exam:  weight is 181 lb 3.2 oz (82.2 kg). His oral temperature is 97.3 F (36.3 C) (abnormal). His blood pressure is 127/83 and his pulse is 67.   Wt Readings from Last 3 Encounters:  02/01/19 181 lb 3.2 oz (82.2 kg)  12/21/18 179 lb 8 oz (81.4 kg)  11/23/18 180 lb 4 oz (81.8 kg)    Physical Exam Vitals signs reviewed.  HENT:     Head: Normocephalic and atraumatic.  Eyes:     Pupils: Pupils are equal, round, and reactive to light.  Neck:     Musculoskeletal: Normal range of motion.  Cardiovascular:     Rate and Rhythm: Normal rate and regular rhythm.     Heart sounds: Normal heart sounds.  Pulmonary:     Effort: Pulmonary effort is normal.     Breath sounds: Normal breath sounds.  Abdominal:     General: Bowel sounds are normal.     Palpations: Abdomen is soft.  Musculoskeletal: Normal range of motion.        General: No tenderness or deformity.  Lymphadenopathy:     Cervical: No cervical adenopathy.  Skin:    General: Skin is warm and dry.     Findings: No erythema or rash.  Neurological:     Mental Status: He is alert and oriented to person, place, and time.  Psychiatric:        Behavior: Behavior normal.        Thought Content: Thought content normal.        Judgment:  Judgment normal.      Lab Results  Component Value Date   WBC 3.2 (L) 02/01/2019   HGB 15.4 02/01/2019   HCT 44.3 02/01/2019   MCV 100.9 (H) 02/01/2019   PLT 131 (L) 02/01/2019   Lab Results  Component Value Date   FERRITIN 335 12/21/2018   IRON 149 12/21/2018  TIBC 335 12/21/2018   UIBC 186 12/21/2018   IRONPCTSAT 44 12/21/2018   Lab Results  Component Value Date   RETICCTPCT 2.7 06/08/2018   RBC 4.39 02/01/2019   Lab Results  Component Value Date   KPAFRELGTCHN 25.8 (H) 12/21/2018   LAMBDASER 12.1 12/21/2018   KAPLAMBRATIO 2.13 (H) 12/21/2018   Lab Results  Component Value Date   IGGSERUM 1,017 12/21/2018   IGA 268 12/21/2018   IGMSERUM 33 12/21/2018   Lab Results  Component Value Date   TOTALPROTELP 6.9 12/21/2018   ALBUMINELP 4.1 12/21/2018   A1GS 0.2 12/21/2018   A2GS 0.5 12/21/2018   BETS 1.0 12/21/2018   GAMS 1.0 12/21/2018   MSPIKE Not Observed 12/21/2018   SPEI Comment 01/05/2018     Chemistry      Component Value Date/Time   NA 142 02/01/2019 1003   NA 143 06/30/2017 1049   NA 140 10/03/2016 0845   K 3.8 02/01/2019 1003   K 4.0 06/30/2017 1049   K 4.3 10/03/2016 0845   CL 108 02/01/2019 1003   CL 105 06/30/2017 1049   CO2 25 02/01/2019 1003   CO2 27 06/30/2017 1049   CO2 26 10/03/2016 0845   BUN 12 02/01/2019 1003   BUN 13 06/30/2017 1049   BUN 11.9 10/03/2016 0845   CREATININE 0.84 02/01/2019 1003   CREATININE 1.0 06/30/2017 1049   CREATININE 0.9 10/03/2016 0845      Component Value Date/Time   CALCIUM 8.9 02/01/2019 1003   CALCIUM 9.0 06/30/2017 1049   CALCIUM 9.9 10/03/2016 0845   ALKPHOS 53 02/01/2019 1003   ALKPHOS 57 06/30/2017 1049   ALKPHOS 80 10/03/2016 0845   AST 49 (H) 02/01/2019 1003   AST 28 10/03/2016 0845   ALT 58 (H) 02/01/2019 1003   ALT 43 06/30/2017 1049   ALT 26 10/03/2016 0845   BILITOT 0.9 02/01/2019 1003   BILITOT 0.48 10/03/2016 0845      Impression and Plan: Mr. Tavella is a pleasant 63 yo  gentleman with IgG kappa myeloma.  He ultimately underwent a autologous stem cell transplant at [facility] in February 2018.   He will get his Zometa today.  We will see about getting to his orthopedic surgeon.  Thankfully, the myeloma seems to be doing quite well right now.  His liver function tests are slightly elevated which I am sure is from the Revlimid.  I would not hold the Revlimid as of yet.  I will plan to see him back in 6 weeks.  [name], MD 7/21/202011:19 AM

## 2019-02-02 LAB — KAPPA/LAMBDA LIGHT CHAINS
Kappa free light chain: 30.7 mg/L — ABNORMAL HIGH (ref 3.3–19.4)
Kappa, lambda light chain ratio: 2.1 — ABNORMAL HIGH (ref 0.26–1.65)
Lambda free light chains: 14.6 mg/L (ref 5.7–26.3)

## 2019-02-02 LAB — FERRITIN: Ferritin: 251 ng/mL (ref 24–336)

## 2019-02-02 LAB — IRON AND TIBC
Iron: 157 ug/dL (ref 42–163)
Saturation Ratios: 50 % (ref 20–55)
TIBC: 315 ug/dL (ref 202–409)
UIBC: 158 ug/dL (ref 117–376)

## 2019-02-02 LAB — IGG, IGA, IGM
IgA: 284 mg/dL (ref 61–437)
IgG (Immunoglobin G), Serum: 1050 mg/dL (ref 603–1613)
IgM (Immunoglobulin M), Srm: 28 mg/dL (ref 20–172)

## 2019-02-03 LAB — PROTEIN ELECTROPHORESIS, SERUM, WITH REFLEX
A/G Ratio: 1.7 (ref 0.7–1.7)
Albumin ELP: 4.3 g/dL (ref 2.9–4.4)
Alpha-1-Globulin: 0.2 g/dL (ref 0.0–0.4)
Alpha-2-Globulin: 0.4 g/dL (ref 0.4–1.0)
Beta Globulin: 1 g/dL (ref 0.7–1.3)
Gamma Globulin: 0.9 g/dL (ref 0.4–1.8)
Globulin, Total: 2.5 g/dL (ref 2.2–3.9)
Total Protein ELP: 6.8 g/dL (ref 6.0–8.5)

## 2019-02-07 ENCOUNTER — Telehealth: Payer: Self-pay | Admitting: *Deleted

## 2019-02-07 MED ORDER — COLESEVELAM HCL 625 MG PO TABS: 625.0000 mg | ORAL_TABLET | Freq: Two times a day (BID) | ORAL | 6 refills | Status: DC

## 2019-02-07 NOTE — Telephone Encounter (Signed)
Message received from patient requesting that his latest blood work be released to [facility] and is also requesting a refill for [employer].  Call placed back to patient and message left to inform patient that Dr. Marin Olp has been notified about releasing lab results and that refill for Welchol has been sent to [facility] in [place].  Instructed pt to call office back with any further questions or concerns.

## 2019-02-15 ENCOUNTER — Other Ambulatory Visit: Payer: Self-pay | Admitting: Medical

## 2019-02-15 ENCOUNTER — Other Ambulatory Visit: Payer: Self-pay | Admitting: *Deleted

## 2019-02-15 DIAGNOSIS — C9000 Multiple myeloma not having achieved remission: Secondary | ICD-10-CM

## 2019-02-15 MED ORDER — LENALIDOMIDE 5 MG PO CAPS: ORAL_CAPSULE | ORAL | 0 refills | Status: DC

## 2019-03-07 ENCOUNTER — Telehealth: Payer: Self-pay | Admitting: Psychiatry

## 2019-03-07 NOTE — Telephone Encounter (Signed)
Okay 30-day refills +1 additional to get him to the appointment.

## 2019-03-07 NOTE — Telephone Encounter (Signed)
Patient need refill on Sertraline and Zolpidem to be sent to [facility] on [address], patient canceled appt., for Sept and rescheduled for 04/18/2019

## 2019-03-08 ENCOUNTER — Telehealth: Payer: Self-pay | Admitting: *Deleted

## 2019-03-08 ENCOUNTER — Other Ambulatory Visit: Payer: Self-pay

## 2019-03-08 DIAGNOSIS — G4701 Insomnia due to medical condition: Secondary | ICD-10-CM

## 2019-03-08 DIAGNOSIS — G8929 Other chronic pain: Secondary | ICD-10-CM

## 2019-03-08 MED ORDER — ZOLPIDEM TARTRATE 10 MG PO TABS: 10.0000 mg | ORAL_TABLET | Freq: Every day | ORAL | 1 refills | Status: DC

## 2019-03-08 MED ORDER — SERTRALINE HCL 100 MG PO TABS: 100.0000 mg | ORAL_TABLET | Freq: Every evening | ORAL | 1 refills | Status: DC

## 2019-03-08 NOTE — Telephone Encounter (Signed)
Message received from patient stating that he will be bringing in a Myeloma kit to be drawn at his next lab appt on 03/15/19.  Note left for lab.

## 2019-03-08 NOTE — Telephone Encounter (Signed)
Refills submitted.  

## 2019-03-15 ENCOUNTER — Inpatient Hospital Stay: Payer: Medicare Other | Attending: Hematology & Oncology | Admitting: Hematology & Oncology

## 2019-03-15 ENCOUNTER — Encounter: Payer: Self-pay | Admitting: Hematology & Oncology

## 2019-03-15 ENCOUNTER — Inpatient Hospital Stay: Payer: Medicare Other

## 2019-03-15 ENCOUNTER — Other Ambulatory Visit: Payer: Self-pay

## 2019-03-15 VITALS — BP 139/90 | HR 57 | Temp 97.1°F | Resp 18 | Wt 181.2 lb

## 2019-03-15 DIAGNOSIS — D509 Iron deficiency anemia, unspecified: Secondary | ICD-10-CM | POA: Insufficient documentation

## 2019-03-15 DIAGNOSIS — D500 Iron deficiency anemia secondary to blood loss (chronic): Secondary | ICD-10-CM | POA: Diagnosis not present

## 2019-03-15 DIAGNOSIS — Z79899 Other long term (current) drug therapy: Secondary | ICD-10-CM | POA: Diagnosis not present

## 2019-03-15 DIAGNOSIS — C9001 Multiple myeloma in remission: Secondary | ICD-10-CM | POA: Insufficient documentation

## 2019-03-15 DIAGNOSIS — C9000 Multiple myeloma not having achieved remission: Secondary | ICD-10-CM | POA: Diagnosis not present

## 2019-03-15 DIAGNOSIS — Z7901 Long term (current) use of anticoagulants: Secondary | ICD-10-CM | POA: Diagnosis not present

## 2019-03-15 DIAGNOSIS — Z86718 Personal history of other venous thrombosis and embolism: Secondary | ICD-10-CM | POA: Diagnosis not present

## 2019-03-15 DIAGNOSIS — Z9484 Stem cells transplant status: Secondary | ICD-10-CM | POA: Insufficient documentation

## 2019-03-15 LAB — CMP (CANCER CENTER ONLY)
ALT: 42 U/L (ref 0–44)
AST: 32 U/L (ref 15–41)
Albumin: 4.3 g/dL (ref 3.5–5.0)
Alkaline Phosphatase: 52 U/L (ref 38–126)
Anion gap: 10 (ref 5–15)
BUN: 11 mg/dL (ref 8–23)
CO2: 27 mmol/L (ref 22–32)
Calcium: 9.1 mg/dL (ref 8.9–10.3)
Chloride: 104 mmol/L (ref 98–111)
Creatinine: 0.87 mg/dL (ref 0.61–1.24)
GFR, Est AFR Am: >60 mL/min (ref >60)
GFR, Estimated: >60 mL/min (ref >60)
Glucose, Bld: 89 mg/dL (ref 70–99)
Potassium: 3.5 mmol/L (ref 3.5–5.1)
Sodium: 141 mmol/L (ref 135–145)
Total Bilirubin: 0.9 mg/dL (ref 0.3–1.2)
Total Protein: 6.9 g/dL (ref 6.5–8.1)

## 2019-03-15 LAB — CBC WITH DIFFERENTIAL (CANCER CENTER ONLY)
Abs Immature Granulocytes: 0.02 10*3/uL (ref 0.00–0.07)
Basophils Absolute: 0.1 10*3/uL (ref 0.0–0.1)
Basophils Relative: 2 %
Eosinophils Absolute: 0.2 10*3/uL (ref 0.0–0.5)
Eosinophils Relative: 6 %
HCT: 43.3 % (ref 39.0–52.0)
Hemoglobin: 14.9 g/dL (ref 13.0–17.0)
Immature Granulocytes: 1 %
Lymphocytes Relative: 34 %
Lymphs Abs: 1.1 10*3/uL (ref 0.7–4.0)
MCH: 35.3 pg — ABNORMAL HIGH (ref 26.0–34.0)
MCHC: 34.4 g/dL (ref 30.0–36.0)
MCV: 102.6 fL — ABNORMAL HIGH (ref 80.0–100.0)
Monocytes Absolute: 0.6 10*3/uL (ref 0.1–1.0)
Monocytes Relative: 20 %
Neutro Abs: 1.2 10*3/uL — ABNORMAL LOW (ref 1.7–7.7)
Neutrophils Relative %: 37 %
Platelet Count: 127 10*3/uL — ABNORMAL LOW (ref 150–400)
RBC: 4.22 MIL/uL (ref 4.22–5.81)
RDW: 13.2 % (ref 11.5–15.5)
WBC Count: 3.1 10*3/uL — ABNORMAL LOW (ref 4.0–10.5)
nRBC: 0 % (ref 0.0–0.2)

## 2019-03-15 LAB — IRON AND TIBC
Iron: 142 ug/dL (ref 42–163)
Saturation Ratios: 45 % (ref 20–55)
TIBC: 319 ug/dL (ref 202–409)
UIBC: 176 ug/dL (ref 117–376)

## 2019-03-15 LAB — FERRITIN: Ferritin: 213 ng/mL (ref 24–336)

## 2019-03-15 NOTE — Progress Notes (Signed)
Hematology and Oncology Follow Up Visit  Aaronjames Kelsay [id number] 03-Nov-1955 63 y.o. 03/15/2019   Principle Diagnosis:  IgG Kappa myeloma - Hyperdiploid/+11 DVT of the LEFT and RIGHT leg  Iron deficiency anemia  Current Therapy:   Revlimid 49m po q day (21 on/7 off) - start on 04/07/2018   Zometa 4 mg IV q 3 months- next dose is 05/2019 Xarelto 10 mg PO daily IV Iron as needed   Interim History:  Mr. PRyseris here today for follow-up.  The big news is that a granddaughter was born yesterday.  This is wonderful for him.  Everything went quite well.  The granddaughter weighed 8 pounds 11 ounces.  There was no problems with the delivery.  His granddaughter's name is [place]  [place]  They will be down there for about 4 or 5 days.  I told to make sure he wears sunscreen and drinks a lot of water.  His myeloma is doing well.  He has no evidence of a monoclonal spike in his blood.  He says he keeps falling out of bed.  He fell out of his bed again.  Has some ecchymoses on the left side of his face.  He has some pain on the left side of his rib cage.  I am not sure as to why he keeps falling out of bed.  Thankfully, he is not broken anything.  He is still walking.  He is doing really well with walking.  He is had no problems with his iron levels.  We last checked his iron in July, his ferritin was 251 with an iron saturation of 50%.  His hemoglobin has been holding steady.  Overall, his performance status is ECOG 1.   Medications:  Allergies as of 03/15/2019   No Known Allergies     Medication List       Accurate as of March 15, 2019 10:54 AM. If you have any questions, ask your nurse or doctor.        acetaminophen 325 MG tablet Commonly known as: TYLENOL Take 2 tablets (650 mg total) by mouth every 6 (six) hours as needed for mild pain (or Fever >/= 101). What changed: how much to take   albuterol 108  (90 Base) MCG/ACT inhaler Commonly known as: VENTOLIN HFA Inhale 2 puffs into the lungs every 6 (six) hours as needed for wheezing or shortness of breath.   amoxicillin 500 MG tablet Commonly known as: AMOXIL Take by mouth.   cetirizine 10 MG tablet Commonly known as: ZYRTEC Take 10 mg by mouth daily as needed for allergies.   colesevelam 625 MG tablet Commonly known as: WELCHOL Take 1 tablet (625 mg total) by mouth 2 (two) times daily with a meal.   famciclovir 500 MG tablet Commonly known as: FAMVIR TAKE 1 TABLET BY MOUTH EVERY DAY   fluticasone 50 MCG/ACT nasal spray Commonly known as: FLONASE SPRAY 2 SPRAYS INTO EACH NOSTRIL EVERY DAY   lenalidomide 5 MG capsule Commonly known as: Revlimid TAKE 1 CAPSULE BY MOUTH  DAILY FOR 21 DAYS ON, THEN  7 DAYS OFF [id number]  lidocaine 5 % Commonly known as: LIDODERM Place 1 patch onto the skin daily as needed (pain).   LIPO-FLAVONOID PLUS PO Take 1 tablet by mouth daily.   Magnesium 400 MG Caps Take 400 mg by mouth daily.   metroNIDAZOLE 500 MG tablet Commonly known as: FLAGYL Take 1 tablet (  500 mg total) by mouth 3 (three) times daily.   OVER THE COUNTER MEDICATION Take 1 capsule by mouth daily. methylcare otc supplement   oxyCODONE 5 MG immediate release tablet Commonly known as: Oxy IR/ROXICODONE Take 1-2 tablets (5-10 mg total) by mouth every 6 (six) hours as needed for moderate pain.   pantoprazole 20 MG tablet Commonly known as: Protonix Take 1 tablet (20 mg total) by mouth 2 (two) times daily. What changed:   when to take this  reasons to take this   Probiotic Caps Take 1 capsule by mouth daily.   rOPINIRole 0.25 MG tablet Commonly known as: REQUIP TAKE 1 TABLET BY MOUTH 1 TO 3 HOURS BEFORE BED FOR 2 DAYS THEN INCREASE TO 2 TABS   sertraline 100 MG tablet Commonly known as: ZOLOFT Take 1 tablet (100 mg total) by mouth every evening.   Vitamin D3 250 MCG (10000 UT) capsule Take 10,000 Units by  mouth daily.   Xarelto 10 MG Tabs tablet Generic drug: rivaroxaban TAKE 1 TABLET BY MOUTH  DAILY   zolpidem 10 MG tablet Commonly known as: AMBIEN Take 1 tablet (10 mg total) by mouth at bedtime.   ZOMETA IV Inject 4 mg into the vein every 3 (three) months. Receives at [name] office.       Allergies: No Known Allergies  Past Medical History, Surgical history, Social history, and Family History were reviewed and updated.  Review of Systems: Review of Systems  Constitutional: Negative.   HENT: Negative.   Eyes: Negative.   Respiratory: Negative.   Cardiovascular: Negative.   Gastrointestinal: Negative.   Genitourinary: Negative.   Musculoskeletal: Positive for myalgias.  Skin: Negative.   Neurological: Negative.   Endo/Heme/Allergies: Negative.   Psychiatric/Behavioral: Negative.      Physical Exam:  weight is 181 lb 4 oz (82.2 kg). His temporal temperature is 97.1 F (36.2 C) (abnormal). His blood pressure is 139/90 and his pulse is 57 (abnormal). His respiration is 18 and oxygen saturation is 99%.   Wt Readings from Last 3 Encounters:  03/15/19 181 lb 4 oz (82.2 kg)  02/01/19 181 lb 3.2 oz (82.2 kg)  12/21/18 179 lb 8 oz (81.4 kg)    Physical Exam Vitals signs reviewed.  HENT:     Head: Normocephalic and atraumatic.  Eyes:     Pupils: Pupils are equal, round, and reactive to light.  Neck:     Musculoskeletal: Normal range of motion.  Cardiovascular:     Rate and Rhythm: Normal rate and regular rhythm.     Heart sounds: Normal heart sounds.  Pulmonary:     Effort: Pulmonary effort is normal.     Breath sounds: Normal breath sounds.  Abdominal:     General: Bowel sounds are normal.     Palpations: Abdomen is soft.  Musculoskeletal: Normal range of motion.        General: No tenderness or deformity.  Lymphadenopathy:     Cervical: No cervical adenopathy.  Skin:    General: Skin is warm and dry.     Findings: No erythema or rash.   Neurological:     Mental Status: He is alert and oriented to person, place, and time.  Psychiatric:        Behavior: Behavior normal.        Thought Content: Thought content normal.        Judgment: Judgment normal.      Lab Results  Component Value Date   WBC 3.1 (L)  03/15/2019   HGB 14.9 03/15/2019   HCT 43.3 03/15/2019   MCV 102.6 (H) 03/15/2019   PLT 127 (L) 03/15/2019   Lab Results  Component Value Date   FERRITIN 251 02/01/2019   IRON 157 02/01/2019   TIBC 315 02/01/2019   UIBC 158 02/01/2019   IRONPCTSAT 50 02/01/2019   Lab Results  Component Value Date   RETICCTPCT 2.7 06/08/2018   RBC 4.22 03/15/2019   Lab Results  Component Value Date   KPAFRELGTCHN 30.7 (H) 02/01/2019   LAMBDASER 14.6 02/01/2019   KAPLAMBRATIO 2.10 (H) 02/01/2019   Lab Results  Component Value Date   IGGSERUM 1,050 02/01/2019   IGA 284 02/01/2019   IGMSERUM 28 02/01/2019   Lab Results  Component Value Date   TOTALPROTELP 6.8 02/01/2019   ALBUMINELP 4.3 02/01/2019   A1GS 0.2 02/01/2019   A2GS 0.4 02/01/2019   BETS 1.0 02/01/2019   GAMS 0.9 02/01/2019   MSPIKE Not Observed 02/01/2019   SPEI Comment 01/05/2018     Chemistry      Component Value Date/Time   NA 141 03/15/2019 0918   NA 143 06/30/2017 1049   NA 140 10/03/2016 0845   K 3.5 03/15/2019 0918   K 4.0 06/30/2017 1049   K 4.3 10/03/2016 0845   CL 104 03/15/2019 0918   CL 105 06/30/2017 1049   CO2 27 03/15/2019 0918   CO2 27 06/30/2017 1049   CO2 26 10/03/2016 0845   BUN 11 03/15/2019 0918   BUN 13 06/30/2017 1049   BUN 11.9 10/03/2016 0845   CREATININE 0.87 03/15/2019 0918   CREATININE 1.0 06/30/2017 1049   CREATININE 0.9 10/03/2016 0845      Component Value Date/Time   CALCIUM 9.1 03/15/2019 0918   CALCIUM 9.0 06/30/2017 1049   CALCIUM 9.9 10/03/2016 0845   ALKPHOS 52 03/15/2019 0918   ALKPHOS 57 06/30/2017 1049   ALKPHOS 80 10/03/2016 0845   AST 32 03/15/2019 0918   AST 28 10/03/2016 0845   ALT 42  03/15/2019 0918   ALT 43 06/30/2017 1049   ALT 26 10/03/2016 0845   BILITOT 0.9 03/15/2019 0918   BILITOT 0.48 10/03/2016 0845      Impression and Plan: Mr. Hobbins is a pleasant 63 yo gentleman with IgG kappa myeloma.  He ultimately underwent a autologous stem cell transplant at [facility] in February 2018.   I do not see any problems for right now.  I do not see any issues with him going down to [place].  He is on the low-dose Xarelto so that should be provided protection for him while he is driving.  We will get him back in 6 more weeks.  He is not due for Zometa until November.    [name], MD 9/1/202010:54 AM

## 2019-03-16 LAB — PROTEIN ELECTROPHORESIS, SERUM, WITH REFLEX
A/G Ratio: 1.6 (ref 0.7–1.7)
Albumin ELP: 4.2 g/dL (ref 2.9–4.4)
Alpha-1-Globulin: 0.2 g/dL (ref 0.0–0.4)
Alpha-2-Globulin: 0.7 g/dL (ref 0.4–1.0)
Beta Globulin: 0.8 g/dL (ref 0.7–1.3)
Gamma Globulin: 0.9 g/dL (ref 0.4–1.8)
Globulin, Total: 2.6 g/dL (ref 2.2–3.9)
Total Protein ELP: 6.8 g/dL (ref 6.0–8.5)

## 2019-03-16 LAB — KAPPA/LAMBDA LIGHT CHAINS
Kappa free light chain: 24.6 mg/L — ABNORMAL HIGH (ref 3.3–19.4)
Kappa, lambda light chain ratio: 2.02 — ABNORMAL HIGH (ref 0.26–1.65)
Lambda free light chains: 12.2 mg/L (ref 5.7–26.3)

## 2019-03-16 LAB — IGG, IGA, IGM
IgA: 275 mg/dL (ref 61–437)
IgG (Immunoglobin G), Serum: 932 mg/dL (ref 603–1613)
IgM (Immunoglobulin M), Srm: 27 mg/dL (ref 20–172)

## 2019-03-22 ENCOUNTER — Ambulatory Visit: Payer: 59 | Admitting: Psychiatry

## 2019-03-23 ENCOUNTER — Other Ambulatory Visit: Payer: Self-pay | Admitting: *Deleted

## 2019-03-23 DIAGNOSIS — C9000 Multiple myeloma not having achieved remission: Secondary | ICD-10-CM

## 2019-03-23 MED ORDER — LENALIDOMIDE 5 MG PO CAPS: ORAL_CAPSULE | ORAL | 0 refills | Status: DC

## 2019-03-29 ENCOUNTER — Other Ambulatory Visit: Payer: Self-pay

## 2019-03-29 ENCOUNTER — Ambulatory Visit (INDEPENDENT_AMBULATORY_CARE_PROVIDER_SITE_OTHER): Payer: Medicare Other

## 2019-03-29 DIAGNOSIS — Z23 Encounter for immunization: Secondary | ICD-10-CM | POA: Diagnosis not present

## 2019-04-18 ENCOUNTER — Ambulatory Visit: Payer: 59 | Admitting: Psychiatry

## 2019-04-26 ENCOUNTER — Inpatient Hospital Stay (HOSPITAL_BASED_OUTPATIENT_CLINIC_OR_DEPARTMENT_OTHER): Payer: Medicare Other | Admitting: Hematology & Oncology

## 2019-04-26 ENCOUNTER — Telehealth: Payer: Self-pay | Admitting: Hematology & Oncology

## 2019-04-26 ENCOUNTER — Encounter: Payer: Self-pay | Admitting: Hematology & Oncology

## 2019-04-26 ENCOUNTER — Other Ambulatory Visit: Payer: Self-pay

## 2019-04-26 ENCOUNTER — Inpatient Hospital Stay: Payer: Medicare Other | Attending: Hematology & Oncology

## 2019-04-26 VITALS — BP 141/93 | HR 65 | Temp 97.8°F | Resp 18 | Ht 67.0 in | Wt 173.0 lb

## 2019-04-26 DIAGNOSIS — C9000 Multiple myeloma not having achieved remission: Secondary | ICD-10-CM | POA: Diagnosis not present

## 2019-04-26 DIAGNOSIS — Z79899 Other long term (current) drug therapy: Secondary | ICD-10-CM | POA: Diagnosis not present

## 2019-04-26 DIAGNOSIS — Z9484 Stem cells transplant status: Secondary | ICD-10-CM | POA: Insufficient documentation

## 2019-04-26 DIAGNOSIS — D500 Iron deficiency anemia secondary to blood loss (chronic): Secondary | ICD-10-CM | POA: Diagnosis not present

## 2019-04-26 DIAGNOSIS — D509 Iron deficiency anemia, unspecified: Secondary | ICD-10-CM | POA: Insufficient documentation

## 2019-04-26 DIAGNOSIS — C9001 Multiple myeloma in remission: Secondary | ICD-10-CM | POA: Diagnosis not present

## 2019-04-26 DIAGNOSIS — Z7901 Long term (current) use of anticoagulants: Secondary | ICD-10-CM | POA: Insufficient documentation

## 2019-04-26 DIAGNOSIS — Z86718 Personal history of other venous thrombosis and embolism: Secondary | ICD-10-CM | POA: Insufficient documentation

## 2019-04-26 LAB — CBC WITH DIFFERENTIAL (CANCER CENTER ONLY)
Abs Immature Granulocytes: 0.02 10*3/uL (ref 0.00–0.07)
Basophils Absolute: 0.1 10*3/uL (ref 0.0–0.1)
Basophils Relative: 1 %
Eosinophils Absolute: 0.2 10*3/uL (ref 0.0–0.5)
Eosinophils Relative: 7 %
HCT: 44.8 % (ref 39.0–52.0)
Hemoglobin: 15.5 g/dL (ref 13.0–17.0)
Immature Granulocytes: 1 %
Lymphocytes Relative: 37 %
Lymphs Abs: 1.4 10*3/uL (ref 0.7–4.0)
MCH: 35.5 pg — ABNORMAL HIGH (ref 26.0–34.0)
MCHC: 34.6 g/dL (ref 30.0–36.0)
MCV: 102.5 fL — ABNORMAL HIGH (ref 80.0–100.0)
Monocytes Absolute: 0.6 10*3/uL (ref 0.1–1.0)
Monocytes Relative: 16 %
Neutro Abs: 1.4 10*3/uL — ABNORMAL LOW (ref 1.7–7.7)
Neutrophils Relative %: 38 %
Platelet Count: 128 10*3/uL — ABNORMAL LOW (ref 150–400)
RBC: 4.37 MIL/uL (ref 4.22–5.81)
RDW: 12.7 % (ref 11.5–15.5)
WBC Count: 3.7 10*3/uL — ABNORMAL LOW (ref 4.0–10.5)
nRBC: 0 % (ref 0.0–0.2)

## 2019-04-26 LAB — CMP (CANCER CENTER ONLY)
ALT: 49 U/L — ABNORMAL HIGH (ref 0–44)
AST: 35 U/L (ref 15–41)
Albumin: 4.4 g/dL (ref 3.5–5.0)
Alkaline Phosphatase: 54 U/L (ref 38–126)
Anion gap: 8 (ref 5–15)
BUN: 13 mg/dL (ref 8–23)
CO2: 27 mmol/L (ref 22–32)
Calcium: 9.5 mg/dL (ref 8.9–10.3)
Chloride: 105 mmol/L (ref 98–111)
Creatinine: 0.93 mg/dL (ref 0.61–1.24)
GFR, Est AFR Am: >60 mL/min (ref >60)
GFR, Estimated: >60 mL/min (ref >60)
Glucose, Bld: 104 mg/dL — ABNORMAL HIGH (ref 70–99)
Potassium: 3.9 mmol/L (ref 3.5–5.1)
Sodium: 140 mmol/L (ref 135–145)
Total Bilirubin: 0.6 mg/dL (ref 0.3–1.2)
Total Protein: 6.9 g/dL (ref 6.5–8.1)

## 2019-04-26 LAB — FERRITIN: Ferritin: 191 ng/mL (ref 24–336)

## 2019-04-26 LAB — IRON AND TIBC
Iron: 102 ug/dL (ref 42–163)
Saturation Ratios: 29 % (ref 20–55)
TIBC: 351 ug/dL (ref 202–409)
UIBC: 249 ug/dL (ref 117–376)

## 2019-04-26 NOTE — Telephone Encounter (Signed)
lmom to inform patient of 12/1 appt at 0800 per 10/13 los

## 2019-04-26 NOTE — Progress Notes (Signed)
Hematology and Oncology Follow Up Visit  Jorge [name] [id number] 1956-04-06 63 y.o. 04/26/2019   Principle Diagnosis:  IgG Kappa myeloma - Hyperdiploid/+11 DVT of the LEFT and RIGHT leg  Iron deficiency anemia  Current Therapy:   Revlimid 54m po q day (21 on/7 off) - start on 04/07/2018   Zometa 4 mg IV q 3 months- next dose is 05/2019 Xarelto 10 mg PO daily IV Iron as needed   Interim History:  [name] here today for follow-up.  Everything is going quite well for him.  [place]  [place] really enjoyed themselves down there.  His new granddaughter is doing quite well.  She is actually going to be visiting this week and they are going [place] go [place] the [place] for a [place].  He is doing well on the Revlimid.    His last myeloma studies back in early September did not show a monoclonal spike.  His IgG level was 932 mg/dL.  His kappa light chain was 2.5 mg/dL.  I do think iron deficiency is a problem for him today.  His hemoglobin is 15.5.    Overall, his performance status is ECOG 1.   Medications:  Allergies as of 04/26/2019   No Known Allergies     Medication List       Accurate as of April 26, 2019 10:35 AM. If you have any questions, ask your nurse or doctor.        STOP taking these medications   Magnesium 400 MG Caps Stopped by: [name]   rOPINIRole 0.25 MG tablet Commonly known as: REQUIP Stopped by: [name]     TAKE these medications   acetaminophen 325 MG tablet Commonly known as: TYLENOL Take 2 tablets (650 mg total) by mouth every 6 (six) hours as needed for mild pain (or Fever >/= 101). What changed: how much to take   albuterol 108 (90 Base) MCG/ACT inhaler Commonly known as: VENTOLIN HFA Inhale 2 puffs into the lungs every 6 (six) hours as needed for wheezing or shortness of breath.   amoxicillin 500 MG tablet Commonly known as: AMOXIL Take by mouth.   cetirizine 10 MG  tablet Commonly known as: ZYRTEC Take 10 mg by mouth daily as needed for allergies.   colesevelam 625 MG tablet Commonly known as: WELCHOL Take 1 tablet (625 mg total) by mouth 2 (two) times daily with a meal.   famciclovir 500 MG tablet Commonly known as: FAMVIR TAKE 1 TABLET BY MOUTH EVERY DAY   fluticasone 50 MCG/ACT nasal spray Commonly known as: FLONASE SPRAY 2 SPRAYS INTO EACH NOSTRIL EVERY DAY   lenalidomide 5 MG capsule Commonly known as: Revlimid TAKE 1 CAPSULE BY MOUTH  DAILY FOR 21 DAYS ON, THEN  7 DAYS OFF [id number]  lidocaine 5 % Commonly known as: LIDODERM Place 1 patch onto the skin daily as needed (pain).   LIPO-FLAVONOID PLUS PO Take 1 tablet by mouth daily.   metroNIDAZOLE 500 MG tablet Commonly known as: FLAGYL Take 1 tablet (500 mg total) by mouth 3 (three) times daily.   OVER THE COUNTER MEDICATION Take 1 capsule by mouth daily. methylcare otc supplement   oxyCODONE 5 MG immediate release tablet Commonly known as: Oxy IR/ROXICODONE Take 1-2 tablets (5-10 mg total) by mouth every 6 (six) hours as needed for moderate pain.   pantoprazole 20 MG tablet Commonly known as: Protonix Take 1 tablet (20  mg total) by mouth 2 (two) times daily. What changed:   when to take this  reasons to take this   Probiotic Caps Take 1 capsule by mouth daily.   sertraline 100 MG tablet Commonly known as: ZOLOFT Take 1 tablet (100 mg total) by mouth every evening.   Vitamin D3 250 MCG (10000 UT) capsule Take 10,000 Units by mouth daily.   Xarelto 10 MG Tabs tablet Generic drug: rivaroxaban TAKE 1 TABLET BY MOUTH  DAILY   zolpidem 10 MG tablet Commonly known as: AMBIEN Take 1 tablet (10 mg total) by mouth at bedtime.   ZOMETA IV Inject 4 mg into the vein every 3 (three) months. Receives at [name] office.       Allergies: No Known Allergies  Past Medical History, Surgical history, Social history, and Family History were reviewed and  updated.  Review of Systems: Review of Systems  Constitutional: Negative.   HENT: Negative.   Eyes: Negative.   Respiratory: Negative.   Cardiovascular: Negative.   Gastrointestinal: Negative.   Genitourinary: Negative.   Musculoskeletal: Positive for myalgias.  Skin: Negative.   Neurological: Negative.   Endo/Heme/Allergies: Negative.   Psychiatric/Behavioral: Negative.      Physical Exam:  height is _0  (1.702 m) and weight is 173 lb (78.5 kg). His temporal temperature is 97.8 F (36.6 C). His blood pressure is 141/93 (abnormal) and his pulse is 65. His respiration is 18 and oxygen saturation is 98%.   Wt Readings from Last 3 Encounters:  04/26/19 173 lb (78.5 kg)  03/15/19 181 lb 4 oz (82.2 kg)  02/01/19 181 lb 3.2 oz (82.2 kg)    Physical Exam Vitals signs reviewed.  HENT:     Head: Normocephalic and atraumatic.  Eyes:     Pupils: Pupils are equal, round, and reactive to light.  Neck:     Musculoskeletal: Normal range of motion.  Cardiovascular:     Rate and Rhythm: Normal rate and regular rhythm.     Heart sounds: Normal heart sounds.  Pulmonary:     Effort: Pulmonary effort is normal.     Breath sounds: Normal breath sounds.  Abdominal:     General: Bowel sounds are normal.     Palpations: Abdomen is soft.  Musculoskeletal: Normal range of motion.        General: No tenderness or deformity.  Lymphadenopathy:     Cervical: No cervical adenopathy.  Skin:    General: Skin is warm and dry.     Findings: No erythema or rash.  Neurological:     Mental Status: He is alert and oriented to person, place, and time.  Psychiatric:        Behavior: Behavior normal.        Thought Content: Thought content normal.        Judgment: Judgment normal.      Lab Results  Component Value Date   WBC 3.7 (L) 04/26/2019   HGB 15.5 04/26/2019   HCT 44.8 04/26/2019   MCV 102.5 (H) 04/26/2019   PLT 128 (L) 04/26/2019   Lab Results  Component Value Date   FERRITIN  213 03/15/2019   IRON 142 03/15/2019   TIBC 319 03/15/2019   UIBC 176 03/15/2019   IRONPCTSAT 45 03/15/2019   Lab Results  Component Value Date   RETICCTPCT 2.7 06/08/2018   RBC 4.37 04/26/2019   Lab Results  Component Value Date   KPAFRELGTCHN 24.6 (H) 03/15/2019   LAMBDASER 12.2 03/15/2019  KAPLAMBRATIO 2.02 (H) 03/15/2019   Lab Results  Component Value Date   IGGSERUM 932 03/15/2019   IGA 275 03/15/2019   IGMSERUM 27 03/15/2019   Lab Results  Component Value Date   TOTALPROTELP 6.8 03/15/2019   ALBUMINELP 4.2 03/15/2019   A1GS 0.2 03/15/2019   A2GS 0.7 03/15/2019   BETS 0.8 03/15/2019   GAMS 0.9 03/15/2019   MSPIKE Not Observed 03/15/2019   SPEI Comment 01/05/2018     Chemistry      Component Value Date/Time   NA 140 04/26/2019 0905   NA 143 06/30/2017 1049   NA 140 10/03/2016 0845   K 3.9 04/26/2019 0905   K 4.0 06/30/2017 1049   K 4.3 10/03/2016 0845   CL 105 04/26/2019 0905   CL 105 06/30/2017 1049   CO2 27 04/26/2019 0905   CO2 27 06/30/2017 1049   CO2 26 10/03/2016 0845   BUN 13 04/26/2019 0905   BUN 13 06/30/2017 1049   BUN 11.9 10/03/2016 0845   CREATININE 0.93 04/26/2019 0905   CREATININE 1.0 06/30/2017 1049   CREATININE 0.9 10/03/2016 0845      Component Value Date/Time   CALCIUM 9.5 04/26/2019 0905   CALCIUM 9.0 06/30/2017 1049   CALCIUM 9.9 10/03/2016 0845   ALKPHOS 54 04/26/2019 0905   ALKPHOS 57 06/30/2017 1049   ALKPHOS 80 10/03/2016 0845   AST 35 04/26/2019 0905   AST 28 10/03/2016 0845   ALT 49 (H) 04/26/2019 0905   ALT 43 06/30/2017 1049   ALT 26 10/03/2016 0845   BILITOT 0.6 04/26/2019 0905   BILITOT 0.48 10/03/2016 0845      Impression and Plan: Mr. Offerdahl is a pleasant 63 yo gentleman with IgG kappa myeloma.  He ultimately underwent a autologous stem cell transplant at [facility] in February 2018.   I do not see any problems for right now.  We will plan to get her back to see [place] in about 6 weeks.  I want see him back after  Thanksgiving.  At that time, we will make sure he gets his Zometa.      [name], MD 10/13/202010:35 AM

## 2019-04-27 ENCOUNTER — Other Ambulatory Visit: Payer: Self-pay | Admitting: *Deleted

## 2019-04-27 DIAGNOSIS — C9000 Multiple myeloma not having achieved remission: Secondary | ICD-10-CM

## 2019-04-27 LAB — IGG, IGA, IGM
IgA: 295 mg/dL (ref 61–437)
IgG (Immunoglobin G), Serum: 1014 mg/dL (ref 603–1613)
IgM (Immunoglobulin M), Srm: 30 mg/dL (ref 20–172)

## 2019-04-27 LAB — KAPPA/LAMBDA LIGHT CHAINS
Kappa free light chain: 30.7 mg/L — ABNORMAL HIGH (ref 3.3–19.4)
Kappa, lambda light chain ratio: 2.19 — ABNORMAL HIGH (ref 0.26–1.65)
Lambda free light chains: 14 mg/L (ref 5.7–26.3)

## 2019-04-27 MED ORDER — LENALIDOMIDE 5 MG PO CAPS: ORAL_CAPSULE | ORAL | 0 refills | Status: DC

## 2019-04-28 LAB — PROTEIN ELECTROPHORESIS, SERUM, WITH REFLEX
A/G Ratio: 1.7 (ref 0.7–1.7)
Albumin ELP: 4.3 g/dL (ref 2.9–4.4)
Alpha-1-Globulin: 0.2 g/dL (ref 0.0–0.4)
Alpha-2-Globulin: 0.6 g/dL (ref 0.4–1.0)
Beta Globulin: 0.9 g/dL (ref 0.7–1.3)
Gamma Globulin: 1 g/dL (ref 0.4–1.8)
Globulin, Total: 2.6 g/dL (ref 2.2–3.9)
Total Protein ELP: 6.9 g/dL (ref 6.0–8.5)

## 2019-05-25 ENCOUNTER — Other Ambulatory Visit: Payer: Self-pay | Admitting: Hematology & Oncology

## 2019-05-25 ENCOUNTER — Other Ambulatory Visit: Payer: Self-pay | Admitting: *Deleted

## 2019-05-25 DIAGNOSIS — M545 Low back pain, unspecified: Secondary | ICD-10-CM

## 2019-05-25 DIAGNOSIS — D472 Monoclonal gammopathy: Secondary | ICD-10-CM

## 2019-05-25 DIAGNOSIS — C9000 Multiple myeloma not having achieved remission: Secondary | ICD-10-CM

## 2019-05-25 DIAGNOSIS — G8929 Other chronic pain: Secondary | ICD-10-CM

## 2019-05-25 MED ORDER — LENALIDOMIDE 5 MG PO CAPS: ORAL_CAPSULE | ORAL | 0 refills | Status: DC

## 2019-05-26 ENCOUNTER — Other Ambulatory Visit: Payer: Self-pay

## 2019-05-26 MED ORDER — PANTOPRAZOLE SODIUM 20 MG PO TBEC: 20.0000 mg | DELAYED_RELEASE_TABLET | Freq: Every day | ORAL | 0 refills | Status: DC

## 2019-05-30 ENCOUNTER — Other Ambulatory Visit: Payer: Self-pay

## 2019-05-30 ENCOUNTER — Encounter: Payer: Self-pay | Admitting: Psychiatry

## 2019-05-30 ENCOUNTER — Ambulatory Visit (INDEPENDENT_AMBULATORY_CARE_PROVIDER_SITE_OTHER): Payer: Medicare Other | Admitting: Psychiatry

## 2019-05-30 ENCOUNTER — Other Ambulatory Visit: Payer: Self-pay | Admitting: Family

## 2019-05-30 DIAGNOSIS — F5105 Insomnia due to other mental disorder: Secondary | ICD-10-CM

## 2019-05-30 DIAGNOSIS — G4701 Insomnia due to medical condition: Secondary | ICD-10-CM

## 2019-05-30 DIAGNOSIS — F411 Generalized anxiety disorder: Secondary | ICD-10-CM

## 2019-05-30 DIAGNOSIS — G8929 Other chronic pain: Secondary | ICD-10-CM | POA: Diagnosis not present

## 2019-05-30 MED ORDER — SERTRALINE HCL 25 MG PO TABS: 75.0000 mg | ORAL_TABLET | Freq: Every evening | ORAL | 1 refills | Status: DC

## 2019-05-30 MED ORDER — ZOLPIDEM TARTRATE 5 MG PO TABS: 5.0000 mg | ORAL_TABLET | Freq: Every day | ORAL | 1 refills | Status: DC

## 2019-05-30 NOTE — Patient Instructions (Addendum)
Reduce sertraline to 75 mg daily for at least 2 mos then if desired reduce to 50 mg daily.

## 2019-05-30 NOTE — Progress Notes (Signed)
Jorge [name] [id number] March 16, 1956 63 y.o.  Subjective:   Patient ID:  Jorge [name] is a 63 y.o. (DOB 1955/12/13) male.  Chief Complaint:  Chief Complaint  Patient presents with  . Follow-up    Medication Management  . Depression    Medication Management    HPI Jorge [name] presents to the office today for follow-up of anxiety.  Last seen by [name] March 2020.  No meds were changed. He continues on sertraline 100 mg daily and Ambien 10 mg nightly.  He is also on oxycodone. He is treated for multiple myeloma.  Primary stress is health.  Chronic pain but pushes through it.  Satisfied with sertraline.   Doing pretty well.  Staying busy and positive with good energy.  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  Job 3 nights weekly delivering pizza.   Patient reports stable mood and denies depressed or irritable moods.  Patient denies any recent difficulty with anxiety.  Patient denies difficulty with sleep initiation or maintenance. Denies appetite disturbance.  Patient reports that energy and motivation have been good.  Patient denies any difficulty with concentration.  Patient denies any suicidal ideation.  Past Psychiatric Medication Trials: Sertraline 100 since 2016 Remotely took Wellbutrin for a year.  Review of Systems:  Review of Systems  Neurological: Negative for tremors and weakness.    Medications: I have reviewed the patient's current medications.  Current Outpatient Medications  Medication Sig Dispense Refill  . acetaminophen (TYLENOL) 325 MG tablet Take 2 tablets (650 mg total) by mouth every 6 (six) hours as needed for mild pain (or Fever >/= 101). (Patient taking differently: Take 325 mg by mouth every 6 (six) hours as needed for mild pain (or Fever >/= 101). )    . albuterol (PROVENTIL HFA;VENTOLIN HFA) 108 (90 Base) MCG/ACT inhaler Inhale 2 puffs into the lungs every 6 (six) hours as needed for wheezing or shortness of breath. 1  Inhaler 2  . amoxicillin (AMOXIL) 500 MG tablet Take by mouth.    . cetirizine (ZYRTEC) 10 MG tablet Take 10 mg by mouth daily as needed for allergies.    . Cholecalciferol (VITAMIN D3) 250 MCG (10000 UT) capsule Take 10,000 Units by mouth daily.    . colesevelam (WELCHOL) 625 MG tablet Take 1 tablet (625 mg total) by mouth 2 (two) times daily with a meal. 60 tablet 6  . famciclovir (FAMVIR) 500 MG tablet TAKE 1 TABLET BY MOUTH  DAILY 90 tablet 4  . fluticasone (FLONASE) 50 MCG/ACT nasal spray SPRAY 2 SPRAYS INTO EACH NOSTRIL EVERY DAY 16 mL 1  . lenalidomide (REVLIMID) 5 MG capsule TAKE 1 CAPSULE BY MOUTH  DAILY FOR 21 DAYS, THEN 7  DAYS OFF DXIP#[id number] 21 capsule 0  . lidocaine (LIDODERM) 5 % Place 1 patch onto the skin daily as needed (pain).   2  . OVER THE COUNTER MEDICATION Take 1 capsule by mouth daily. methylcare otc supplement    . oxyCODONE (OXY IR/ROXICODONE) 5 MG immediate release tablet Take 1-2 tablets (5-10 mg total) by mouth every 6 (six) hours as needed for moderate pain. 20 tablet 0  . pantoprazole (PROTONIX) 20 MG tablet Take 1 tablet (20 mg total) by mouth daily. Please schedule an office visit for more refills 90 tablet 0  . Probiotic CAPS Take 1 capsule by mouth daily.    . sertraline (ZOLOFT) 25 MG tablet Take 3 tablets (75 mg total) by mouth every evening. 135 tablet 1  .  Vitamins-Lipotropics (LIPO-FLAVONOID PLUS PO) Take 1 tablet by mouth daily.    Alveda Reasons 10 MG TABS tablet TAKE 1 TABLET BY MOUTH  DAILY 90 tablet 3  . Zoledronic Acid (ZOMETA IV) Inject 4 mg into the vein every 3 (three) months. Receives at [name] office.    [name] zolpidem (AMBIEN) 5 MG tablet Take 1 tablet (5 mg total) by mouth at bedtime. 90 tablet 1   No current facility-administered medications for this visit.    Facility-Administered Medications Ordered in Other Visits  Medication Dose Route Frequency Provider Last Rate Last Dose  . heparin lock flush 100 unit/mL  500 Units Intravenous Once  [place], Sarah M, NP      . sodium chloride flush (NS) 0.9 % injection 3 mL  3 mL Intravenous Once PRN [place], Holli Humbles, NP        Medication Side Effects: None  Allergies: No Known Allergies  Past Medical History:  Diagnosis Date  . Allergy   . Anxiety   . Blood transfusion without reported diagnosis 2018  . Bone metastasis ([place])   . Chest cold 05/19/2016   productive cough  -- started on antibiotic  . Chronic back pain    due to bone mets from myeloma  . Cough   . Depression   . Diverticulitis   . GERD (gastroesophageal reflux disease)   . Hiatal hernia   . History of chicken pox   . History of concussion    age 20 -- no residual  . History of DVT of lower extremity 03/21/2016  treated and completed w/ xarelto   per doppler left extensive occlusion common femoral, femoral, and popliteal veins and right partial occlusion common femoral and profunda femoral veins/  last doppler 06-04-2016 no evidence acute or chronic dvt noted either leg   . History of radiation therapy 06/09/16-06/23/16   lower thoracic spine 25 Gy in 10 fractions  . Mouth ulcers    secondary to radiation  . Multiple myeloma ([place]) dx 02/22/2016 via bone marrow bx---  oncologist-  [name]   IgG Kappa-- Hyperdiploid/ +11 w/ bone mets--  current treatment chemotherapy (started 08/ 2017)and pallitive radiation to back started 06-09-2016  . Renal calculus, right   . Wears contact lenses     Family History  Problem Relation Age of Onset  . Uterine cancer Mother   . Ovarian cancer Mother   . Colon cancer Mother        said it was rectal or colon but not sure  . Rectal cancer Mother   . Heart disease Father   . Hypertension Father   . Multiple sclerosis Sister   . Paranoid behavior Brother   . Drug abuse Brother   . Schizophrenia Brother   . Stroke Maternal Grandfather   . Cancer Maternal Aunt   . Leukemia Paternal Aunt   . Healthy Son        x1  . Healthy Daughter        x2  . Allergies  Daughter        x1  . Diabetes Neg Hx   . Alzheimer's disease Neg Hx   . Parkinson's disease Neg Hx   . Esophageal cancer Neg Hx   . Stomach cancer Neg Hx     Social History   Socioeconomic History  . Marital status: Married    Spouse name: Not on file  . Number of children: 3  . Years of education: Not on file  . Highest  education level: Not on file  Occupational History  . Not on file  Social Needs  . Financial resource strain: Not on file  . Food insecurity    Worry: Not on file    Inability: Not on file  . Transportation needs    Medical: Not on file    Non-medical: Not on file  Tobacco Use  . Smoking status: Former Smoker    Packs/day: 1.00    Years: 7.00    Pack years: 7.00    Types: Cigarettes    Quit date: 11/02/1980    Years since quitting: 38.5  . Smokeless tobacco: Never Used  Substance and Sexual Activity  . Alcohol use: Yes    Comment: occasional  . Drug use: No  . Sexual activity: Never  Lifestyle  . Physical activity    Days per week: Not on file    Minutes per session: Not on file  . Stress: Not on file  Relationships  . Social Herbalist on phone: Not on file    Gets together: Not on file    Attends religious service: Not on file    Active member of club or organization: Not on file    Attends meetings of clubs or organizations: Not on file    Relationship status: Not on file  . Intimate partner violence    Fear of current or ex partner: Not on file    Emotionally abused: Not on file    Physically abused: Not on file    Forced sexual activity: Not on file  Other Topics Concern  . Not on file  Social History Narrative  . Not on file    Past Medical History, Surgical history, Social history, and Family history were reviewed and updated as appropriate.   Please see review of systems for further details on the patient's review from today.   Objective:   Physical Exam:  There were no vitals taken for this visit.  Physical  Exam Constitutional:      General: He is not in acute distress.    Appearance: He is well-developed.  Musculoskeletal:        General: No deformity.  Neurological:     Mental Status: He is alert and oriented to person, place, and time.     Coordination: Coordination normal.  Psychiatric:        Attention and Perception: Attention and perception normal. He does not perceive auditory or visual hallucinations.        Mood and Affect: Mood normal. Mood is not anxious or depressed. Affect is not labile, blunt, angry or inappropriate.        Speech: Speech normal.        Behavior: Behavior normal.        Thought Content: Thought content normal. Thought content does not include homicidal or suicidal ideation. Thought content does not include homicidal or suicidal plan.        Cognition and Memory: Cognition and memory normal.        Judgment: Judgment normal.     Comments: Insight intact. No delusions.      Lab Review:     Component Value Date/Time   NA 140 04/26/2019 0905   NA 143 06/30/2017 1049   NA 140 10/03/2016 0845   K 3.9 04/26/2019 0905   K 4.0 06/30/2017 1049   K 4.3 10/03/2016 0845   CL 105 04/26/2019 0905   CL 105 06/30/2017 1049   CO2 27 04/26/2019  0905   CO2 27 06/30/2017 1049   CO2 26 10/03/2016 0845   GLUCOSE 104 (H) 04/26/2019 0905   GLUCOSE 89 06/30/2017 1049   BUN 13 04/26/2019 0905   BUN 13 06/30/2017 1049   BUN 11.9 10/03/2016 0845   CREATININE 0.93 04/26/2019 0905   CREATININE 1.0 06/30/2017 1049   CREATININE 0.9 10/03/2016 0845   CALCIUM 9.5 04/26/2019 0905   CALCIUM 9.0 06/30/2017 1049   CALCIUM 9.9 10/03/2016 0845   PROT 6.9 04/26/2019 0905   PROT 6.5 06/30/2017 1049   PROT 6.1 06/30/2017 1049   PROT 6.5 10/03/2016 0845   ALBUMIN 4.4 04/26/2019 0905   ALBUMIN 3.7 06/30/2017 1049   ALBUMIN 4.1 10/03/2016 0845   AST 35 04/26/2019 0905   AST 28 10/03/2016 0845   ALT 49 (H) 04/26/2019 0905   ALT 43 06/30/2017 1049   ALT 26 10/03/2016 0845    ALKPHOS 54 04/26/2019 0905   ALKPHOS 57 06/30/2017 1049   ALKPHOS 80 10/03/2016 0845   BILITOT 0.6 04/26/2019 0905   BILITOT 0.48 10/03/2016 0845   GFRNONAA >60 04/26/2019 0905   GFRAA >60 04/26/2019 0905       Component Value Date/Time   WBC 3.7 (L) 04/26/2019 0905   WBC 6.1 08/14/2018 0416   RBC 4.37 04/26/2019 0905   HGB 15.5 04/26/2019 0905   HGB 12.3 (L) 06/30/2017 1049   HCT 44.8 04/26/2019 0905   HCT 36.9 (L) 06/30/2017 1049   PLT 128 (L) 04/26/2019 0905   PLT 158 06/30/2017 1049   MCV 102.5 (H) 04/26/2019 0905   MCV 92 06/30/2017 1049   MCH 35.5 (H) 04/26/2019 0905   MCHC 34.6 04/26/2019 0905   RDW 12.7 04/26/2019 0905   RDW 14.9 06/30/2017 1049   LYMPHSABS 1.4 04/26/2019 0905   LYMPHSABS 1.0 06/30/2017 1049   MONOABS 0.6 04/26/2019 0905   EOSABS 0.2 04/26/2019 0905   EOSABS 0.1 06/30/2017 1049   BASOSABS 0.1 04/26/2019 0905   BASOSABS 0.0 06/30/2017 1049    No results found for: POCLITH, LITHIUM   No results found for: PHENYTOIN, PHENOBARB, VALPROATE, CBMZ   .res Assessment: Plan:    Jeshurun was seen today for follow-up and depression.  Diagnoses and all orders for this visit:  Generalized anxiety disorder -     sertraline (ZOLOFT) 25 MG tablet; Take 3 tablets (75 mg total) by mouth every evening.  Insomnia due to mental condition  Insomnia secondary to chronic pain -     zolpidem (AMBIEN) 5 MG tablet; Take 1 tablet (5 mg total) by mouth at bedtime.   Now not good time to taper DT stress of cancer.  He'd like to try reducing  OK reduce sertraline 75 mg daily. Disc dosing range. Reduce sertraline to 75 mg daily for at least 2 mos then if desired reduce to 50 mg daily.  Discussed the risk of relapse.  He has been stable for several years now.  Reduce Ambien to 5 mg daily.  Disc SE.  FU  Prn  Lynder Parents, MD, DFAPA   Please see After Visit Summary for patient specific instructions.  Future Appointments  Date Time Provider [place]   06/14/2019  8:00 AM [facility] LAB [facility] None  06/14/2019  8:30 AM Ennever, Rudell Cobb, MD [facility] None  06/14/2019  9:00 AM [facility] B3 [facility] None    No orders of the defined types were placed in this encounter.   -------------------------------

## 2019-06-14 ENCOUNTER — Inpatient Hospital Stay: Payer: Medicare Other

## 2019-06-14 ENCOUNTER — Inpatient Hospital Stay: Payer: Medicare Other | Attending: Hematology & Oncology | Admitting: Hematology & Oncology

## 2019-06-14 ENCOUNTER — Encounter: Payer: Self-pay | Admitting: Hematology & Oncology

## 2019-06-14 ENCOUNTER — Telehealth: Payer: Self-pay | Admitting: Hematology & Oncology

## 2019-06-14 ENCOUNTER — Other Ambulatory Visit: Payer: Self-pay

## 2019-06-14 ENCOUNTER — Other Ambulatory Visit: Payer: Self-pay | Admitting: *Deleted

## 2019-06-14 VITALS — BP 155/95 | HR 62 | Temp 97.9°F | Resp 18 | Wt 186.0 lb

## 2019-06-14 DIAGNOSIS — R197 Diarrhea, unspecified: Secondary | ICD-10-CM | POA: Insufficient documentation

## 2019-06-14 DIAGNOSIS — Z7901 Long term (current) use of anticoagulants: Secondary | ICD-10-CM | POA: Insufficient documentation

## 2019-06-14 DIAGNOSIS — C9000 Multiple myeloma not having achieved remission: Secondary | ICD-10-CM

## 2019-06-14 DIAGNOSIS — Z79899 Other long term (current) drug therapy: Secondary | ICD-10-CM | POA: Diagnosis not present

## 2019-06-14 DIAGNOSIS — D500 Iron deficiency anemia secondary to blood loss (chronic): Secondary | ICD-10-CM

## 2019-06-14 LAB — IRON AND TIBC
Iron: 151 ug/dL (ref 42–163)
Saturation Ratios: 40 % (ref 20–55)
TIBC: 379 ug/dL (ref 202–409)
UIBC: 227 ug/dL (ref 117–376)

## 2019-06-14 LAB — CMP (CANCER CENTER ONLY)
ALT: 56 U/L — ABNORMAL HIGH (ref 0–44)
AST: 40 U/L (ref 15–41)
Albumin: 4.9 g/dL (ref 3.5–5.0)
Alkaline Phosphatase: 64 U/L (ref 38–126)
Anion gap: 10 (ref 5–15)
BUN: 14 mg/dL (ref 8–23)
CO2: 29 mmol/L (ref 22–32)
Calcium: 9.6 mg/dL (ref 8.9–10.3)
Chloride: 102 mmol/L (ref 98–111)
Creatinine: 0.89 mg/dL (ref 0.61–1.24)
GFR, Est AFR Am: >60 mL/min (ref >60)
GFR, Estimated: >60 mL/min (ref >60)
Glucose, Bld: 104 mg/dL — ABNORMAL HIGH (ref 70–99)
Potassium: 3.9 mmol/L (ref 3.5–5.1)
Sodium: 141 mmol/L (ref 135–145)
Total Bilirubin: 0.8 mg/dL (ref 0.3–1.2)
Total Protein: 7.8 g/dL (ref 6.5–8.1)

## 2019-06-14 LAB — CBC WITH DIFFERENTIAL (CANCER CENTER ONLY)
Abs Immature Granulocytes: 0.02 10*3/uL (ref 0.00–0.07)
Basophils Absolute: 0 10*3/uL (ref 0.0–0.1)
Basophils Relative: 1 %
Eosinophils Absolute: 0.2 10*3/uL (ref 0.0–0.5)
Eosinophils Relative: 6 %
HCT: 46.8 % (ref 39.0–52.0)
Hemoglobin: 16 g/dL (ref 13.0–17.0)
Immature Granulocytes: 1 %
Lymphocytes Relative: 40 %
Lymphs Abs: 1.2 10*3/uL (ref 0.7–4.0)
MCH: 35.2 pg — ABNORMAL HIGH (ref 26.0–34.0)
MCHC: 34.2 g/dL (ref 30.0–36.0)
MCV: 103.1 fL — ABNORMAL HIGH (ref 80.0–100.0)
Monocytes Absolute: 0.3 10*3/uL (ref 0.1–1.0)
Monocytes Relative: 11 %
Neutro Abs: 1.3 10*3/uL — ABNORMAL LOW (ref 1.7–7.7)
Neutrophils Relative %: 41 %
Platelet Count: 146 10*3/uL — ABNORMAL LOW (ref 150–400)
RBC: 4.54 MIL/uL (ref 4.22–5.81)
RDW: 12.9 % (ref 11.5–15.5)
WBC Count: 3.1 10*3/uL — ABNORMAL LOW (ref 4.0–10.5)
nRBC: 0 % (ref 0.0–0.2)

## 2019-06-14 LAB — FERRITIN: Ferritin: 163 ng/mL (ref 24–336)

## 2019-06-14 MED ORDER — ZOLEDRONIC ACID 4 MG/5ML IV CONC
4.0000 mg | Freq: Once | INTRAVENOUS | Status: AC
  Administered 2019-06-14: 4 mg via INTRAVENOUS
  Filled 2019-06-14: qty 5

## 2019-06-14 MED ORDER — SODIUM CHLORIDE 0.9 % IV SOLN
Freq: Once | INTRAVENOUS | Status: AC
  Administered 2019-06-14: 09:00:00 via INTRAVENOUS
  Filled 2019-06-14: qty 250

## 2019-06-14 NOTE — Progress Notes (Signed)
Hematology and Oncology Follow Up Visit  Jorge [name] [id number] 1956-03-26 62 y.o. 06/14/2019   Principle Diagnosis:  IgG Kappa myeloma - Hyperdiploid/+11 DVT of the LEFT and RIGHT leg  Iron deficiency anemia  Current Therapy:   Revlimid 44m po q day (21 on/7 off) - start on 04/07/2018   Zometa 4 mg IV q 3 months- next dose is 08/2019 Xarelto 10 mg PO daily IV Iron as needed   Interim History:  Jorge [name] here today for follow-up.  So far, everything is going pretty well for him.  One problem that he is having is that his blood pressure is going up.  [facility]  I have not heard of WelChol causing a problem with blood pressure.  However, we will have him stop the WelChol for about 4-6 weeks and then see how things go.  I do not see any problems with his myeloma.  He does not have a monoclonal spike in his blood we last checked him in October.  His IgG level was 1000 mg/dL.  His kappa light chain was 3.1 mg/dL.  He is still having little bit of diarrhea.  The WelChol does seem to help this.  He has had no fever.  He has had no rash.  There is been no bleeding.  Overall, his performance status is ECOG 1.   Medications:  Allergies as of 06/14/2019   No Known Allergies     Medication List       Accurate as of June 14, 2019  8:29 AM. If you have any questions, ask your nurse or doctor.        acetaminophen 325 MG tablet Commonly known as: TYLENOL Take 2 tablets (650 mg total) by mouth every 6 (six) hours as needed for mild pain (or Fever >/= 101). What changed: how much to take   albuterol 108 (90 Base) MCG/ACT inhaler Commonly known as: VENTOLIN HFA Inhale 2 puffs into the lungs every 6 (six) hours as needed for wheezing or shortness of breath.   amoxicillin 500 MG tablet Commonly known as: AMOXIL Take by mouth.   cetirizine 10 MG tablet Commonly known as: ZYRTEC Take 10 mg by mouth daily as needed for allergies.   colesevelam  625 MG tablet Commonly known as: WELCHOL Take 1 tablet (625 mg total) by mouth 2 (two) times daily with a meal.   famciclovir 500 MG tablet Commonly known as: FAMVIR TAKE 1 TABLET BY MOUTH  DAILY   fluticasone 50 MCG/ACT nasal spray Commonly known as: FLONASE SPRAY 2 SPRAYS INTO EACH NOSTRIL EVERY DAY   lenalidomide 5 MG capsule Commonly known as: Revlimid TAKE 1 CAPSULE BY MOUTH  DAILY FOR 21 DAYS, THEN 7  DAYS OFF Auth#-[id number]   lidocaine 5 % Commonly known as: LIDODERM Place 1 patch onto the skin daily as needed (pain).   LIPO-FLAVONOID PLUS PO Take 1 tablet by mouth daily.   OVER THE COUNTER MEDICATION Take 1 capsule by mouth daily. methylcare otc supplement   oxyCODONE 5 MG immediate release tablet Commonly known as: Oxy IR/ROXICODONE Take 1-2 tablets (5-10 mg total) by mouth every 6 (six) hours as needed for moderate pain.   pantoprazole 20 MG tablet Commonly known as: Protonix Take 1 tablet (20 mg total) by mouth daily. Please schedule an office visit for more refills   Probiotic Caps Take 1 capsule by mouth daily.   sertraline 25 MG tablet Commonly known as: ZOLOFT Take 3 tablets (  75 mg total) by mouth every evening.   Vitamin D3 250 MCG (10000 UT) capsule Take 10,000 Units by mouth daily.   Xarelto 10 MG Tabs tablet Generic drug: rivaroxaban TAKE 1 TABLET BY MOUTH  DAILY   zolpidem 5 MG tablet Commonly known as: AMBIEN Take 1 tablet (5 mg total) by mouth at bedtime.   ZOMETA IV Inject 4 mg into the vein every 3 (three) months. Receives at [name] office.       Allergies: No Known Allergies  Past Medical History, Surgical history, Social history, and Family History were reviewed and updated.  Review of Systems: Review of Systems  Constitutional: Negative.   HENT: Negative.   Eyes: Negative.   Respiratory: Negative.   Cardiovascular: Negative.   Gastrointestinal: Negative.   Genitourinary: Negative.   Musculoskeletal: Positive for  myalgias.  Skin: Negative.   Neurological: Negative.   Endo/Heme/Allergies: Negative.   Psychiatric/Behavioral: Negative.      Physical Exam:  weight is 186 lb (84.4 kg). His temporal temperature is 97.9 F (36.6 C). His blood pressure is 155/95 (abnormal) and his pulse is 62. His respiration is 18 and oxygen saturation is 98%.   Wt Readings from Last 3 Encounters:  06/14/19 186 lb (84.4 kg)  04/26/19 173 lb (78.5 kg)  03/15/19 181 lb 4 oz (82.2 kg)    Physical Exam Vitals signs reviewed.  HENT:     Head: Normocephalic and atraumatic.  Eyes:     Pupils: Pupils are equal, round, and reactive to light.  Neck:     Musculoskeletal: Normal range of motion.  Cardiovascular:     Rate and Rhythm: Normal rate and regular rhythm.     Heart sounds: Normal heart sounds.  Pulmonary:     Effort: Pulmonary effort is normal.     Breath sounds: Normal breath sounds.  Abdominal:     General: Bowel sounds are normal.     Palpations: Abdomen is soft.  Musculoskeletal: Normal range of motion.        General: No tenderness or deformity.  Lymphadenopathy:     Cervical: No cervical adenopathy.  Skin:    General: Skin is warm and dry.     Findings: No erythema or rash.  Neurological:     Mental Status: He is alert and oriented to person, place, and time.  Psychiatric:        Behavior: Behavior normal.        Thought Content: Thought content normal.        Judgment: Judgment normal.      Lab Results  Component Value Date   WBC 3.1 (L) 06/14/2019   HGB 16.0 06/14/2019   HCT 46.8 06/14/2019   MCV 103.1 (H) 06/14/2019   PLT 146 (L) 06/14/2019   Lab Results  Component Value Date   FERRITIN 191 04/26/2019   IRON 102 04/26/2019   TIBC 351 04/26/2019   UIBC 249 04/26/2019   IRONPCTSAT 29 04/26/2019   Lab Results  Component Value Date   RETICCTPCT 2.7 06/08/2018   RBC 4.54 06/14/2019   Lab Results  Component Value Date   KPAFRELGTCHN 30.7 (H) 04/26/2019   LAMBDASER 14.0  04/26/2019   KAPLAMBRATIO 2.19 (H) 04/26/2019   Lab Results  Component Value Date   IGGSERUM 1,014 04/26/2019   IGA 295 04/26/2019   IGMSERUM 30 04/26/2019   Lab Results  Component Value Date   TOTALPROTELP 6.9 04/26/2019   ALBUMINELP 4.3 04/26/2019   A1GS 0.2 04/26/2019   A2GS  0.6 04/26/2019   BETS 0.9 04/26/2019   GAMS 1.0 04/26/2019   MSPIKE Not Observed 04/26/2019   SPEI Comment 01/05/2018     Chemistry      Component Value Date/Time   NA 140 04/26/2019 0905   NA 143 06/30/2017 1049   NA 140 10/03/2016 0845   K 3.9 04/26/2019 0905   K 4.0 06/30/2017 1049   K 4.3 10/03/2016 0845   CL 105 04/26/2019 0905   CL 105 06/30/2017 1049   CO2 27 04/26/2019 0905   CO2 27 06/30/2017 1049   CO2 26 10/03/2016 0845   BUN 13 04/26/2019 0905   BUN 13 06/30/2017 1049   BUN 11.9 10/03/2016 0845   CREATININE 0.93 04/26/2019 0905   CREATININE 1.0 06/30/2017 1049   CREATININE 0.9 10/03/2016 0845      Component Value Date/Time   CALCIUM 9.5 04/26/2019 0905   CALCIUM 9.0 06/30/2017 1049   CALCIUM 9.9 10/03/2016 0845   ALKPHOS 54 04/26/2019 0905   ALKPHOS 57 06/30/2017 1049   ALKPHOS 80 10/03/2016 0845   AST 35 04/26/2019 0905   AST 28 10/03/2016 0845   ALT 49 (H) 04/26/2019 0905   ALT 43 06/30/2017 1049   ALT 26 10/03/2016 0845   BILITOT 0.6 04/26/2019 0905   BILITOT 0.48 10/03/2016 0845      Impression and Plan: Jorge [name] is a pleasant 63 yo gentleman with IgG kappa myeloma.  He ultimately underwent a autologous stem cell transplant [facility] in February 2018.   I do not see any problems for right now.  We will plan to get him back to see [place] in about 6 weeks.  We will see what his blood pressure is at that time.  If his blood pressure is still high, we will have to make sure that his family doctor is involved.  He will get his Zometa today.    [name], MD 12/1/20208:29 AM

## 2019-06-14 NOTE — Patient Instructions (Signed)
Zoledronic Acid injection (Hypercalcemia, Oncology) What is this medicine? ZOLEDRONIC ACID (ZOE le dron ik AS id) lowers the amount of calcium loss from bone. It is used to treat too much calcium in your blood from cancer. It is also used to prevent complications of cancer that has spread to the bone. This medicine may be used for other purposes; ask your health care provider or pharmacist if you have questions. COMMON BRAND NAME(S): Zometa What should I tell my health care provider before I take this medicine? They need to know if you have any of these conditions:  aspirin-sensitive asthma  cancer, especially if you are receiving medicines used to treat cancer  dental disease or wear dentures  infection  kidney disease  receiving corticosteroids like dexamethasone or prednisone  an unusual or allergic reaction to zoledronic acid, other medicines, foods, dyes, or preservatives  pregnant or trying to get pregnant  breast-feeding How should I use this medicine? This medicine is for infusion into a vein. It is given by a health care professional in a hospital or clinic setting. Talk to your pediatrician regarding the use of this medicine in children. Special care may be needed. Overdosage: If you think you have taken too much of this medicine contact a poison control center or emergency room at once. NOTE: This medicine is only for you. Do not share this medicine with others. What if I miss a dose? It is important not to miss your dose. Call your doctor or health care professional if you are unable to keep an appointment. What may interact with this medicine?  certain antibiotics given by injection  NSAIDs, medicines for pain and inflammation, like ibuprofen or naproxen  some diuretics like bumetanide, furosemide  teriparatide  thalidomide This list may not describe all possible interactions. Give your health care provider a list of all the medicines, herbs, non-prescription  drugs, or dietary supplements you use. Also tell them if you smoke, drink alcohol, or use illegal drugs. Some items may interact with your medicine. What should I watch for while using this medicine? Visit your doctor or health care professional for regular checkups. It may be some time before you see the benefit from this medicine. Do not stop taking your medicine unless your doctor tells you to. Your doctor may order blood tests or other tests to see how you are doing. Women should inform their doctor if they wish to become pregnant or think they might be pregnant. There is a potential for serious side effects to an unborn child. Talk to your health care professional or pharmacist for more information. You should make sure that you get enough calcium and vitamin D while you are taking this medicine. Discuss the foods you eat and the vitamins you take with your health care professional. Some people who take this medicine have severe bone, joint, and/or muscle pain. This medicine may also increase your risk for jaw problems or a broken thigh bone. Tell your doctor right away if you have severe pain in your jaw, bones, joints, or muscles. Tell your doctor if you have any pain that does not go away or that gets worse. Tell your dentist and dental surgeon that you are taking this medicine. You should not have major dental surgery while on this medicine. See your dentist to have a dental exam and fix any dental problems before starting this medicine. Take good care of your teeth while on this medicine. Make sure you see your dentist for regular follow-up   appointments. What side effects may I notice from receiving this medicine? Side effects that you should report to your doctor or health care professional as soon as possible:  allergic reactions like skin rash, itching or hives, swelling of the face, lips, or tongue  anxiety, confusion, or depression  breathing problems  changes in vision  eye  pain  feeling faint or lightheaded, falls  jaw pain, especially after dental work  mouth sores  muscle cramps, stiffness, or weakness  redness, blistering, peeling or loosening of the skin, including inside the mouth  trouble passing urine or change in the amount of urine Side effects that usually do not require medical attention (report to your doctor or health care professional if they continue or are bothersome):  bone, joint, or muscle pain  constipation  diarrhea  fever  hair loss  irritation at site where injected  loss of appetite  nausea, vomiting  stomach upset  trouble sleeping  trouble swallowing  weak or tired This list may not describe all possible side effects. Call your doctor for medical advice about side effects. You may report side effects to FDA at 1-800-FDA-1088. Where should I keep my medicine? This drug is given in a hospital or clinic and will not be stored at home. NOTE: This sheet is a summary. It may not cover all possible information. If you have questions about this medicine, talk to your doctor, pharmacist, or health care provider.  2020 Elsevier/Gold Standard (2013-11-26 14:19:39)  

## 2019-06-14 NOTE — Telephone Encounter (Signed)
Appointments scheduled calendar printed per 1/21 los 

## 2019-06-15 LAB — PROTEIN ELECTROPHORESIS, SERUM, WITH REFLEX
A/G Ratio: 1.6 (ref 0.7–1.7)
Albumin ELP: 4.2 g/dL (ref 2.9–4.4)
Alpha-1-Globulin: 0.2 g/dL (ref 0.0–0.4)
Alpha-2-Globulin: 0.5 g/dL (ref 0.4–1.0)
Beta Globulin: 1 g/dL (ref 0.7–1.3)
Gamma Globulin: 1 g/dL (ref 0.4–1.8)
Globulin, Total: 2.7 g/dL (ref 2.2–3.9)
Total Protein ELP: 6.9 g/dL (ref 6.0–8.5)

## 2019-06-15 LAB — IGG, IGA, IGM
IgA: 302 mg/dL (ref 61–437)
IgG (Immunoglobin G), Serum: 1100 mg/dL (ref 603–1613)
IgM (Immunoglobulin M), Srm: 28 mg/dL (ref 20–172)

## 2019-06-15 LAB — KAPPA/LAMBDA LIGHT CHAINS
Kappa free light chain: 29.6 mg/L — ABNORMAL HIGH (ref 3.3–19.4)
Kappa, lambda light chain ratio: 2.53 — ABNORMAL HIGH (ref 0.26–1.65)
Lambda free light chains: 11.7 mg/L (ref 5.7–26.3)

## 2019-06-21 ENCOUNTER — Other Ambulatory Visit: Payer: Self-pay | Admitting: *Deleted

## 2019-06-21 DIAGNOSIS — C9000 Multiple myeloma not having achieved remission: Secondary | ICD-10-CM

## 2019-06-21 MED ORDER — LENALIDOMIDE 5 MG PO CAPS: ORAL_CAPSULE | ORAL | 0 refills | Status: DC

## 2019-07-11 ENCOUNTER — Telehealth: Payer: Self-pay | Admitting: *Deleted

## 2019-07-11 NOTE — Telephone Encounter (Signed)
Returned patient's phone call regarding blood pressure readings. He stated,"since Dr. Marin Olp has taken me off Welchol, my blood pressures haven't gone down. I'm checking them at home daily. They are running around 160/100." I instructed him to call his cardiologist at [facility], and try to move up his April appointment or see if they can do a televisit. He verbalized understanding.

## 2019-07-20 ENCOUNTER — Other Ambulatory Visit: Payer: Self-pay | Admitting: Hematology & Oncology

## 2019-07-20 DIAGNOSIS — C9000 Multiple myeloma not having achieved remission: Secondary | ICD-10-CM

## 2019-07-25 ENCOUNTER — Encounter: Payer: Self-pay | Admitting: Hematology & Oncology

## 2019-07-25 ENCOUNTER — Other Ambulatory Visit: Payer: Self-pay

## 2019-07-25 ENCOUNTER — Telehealth: Payer: Self-pay | Admitting: Hematology & Oncology

## 2019-07-25 ENCOUNTER — Inpatient Hospital Stay: Payer: Medicare Other | Attending: Hematology & Oncology

## 2019-07-25 ENCOUNTER — Inpatient Hospital Stay: Payer: Medicare Other

## 2019-07-25 ENCOUNTER — Inpatient Hospital Stay (HOSPITAL_BASED_OUTPATIENT_CLINIC_OR_DEPARTMENT_OTHER): Payer: Medicare Other | Admitting: Hematology & Oncology

## 2019-07-25 VITALS — BP 147/97 | HR 68 | Temp 97.8°F | Resp 19 | Wt 187.0 lb

## 2019-07-25 DIAGNOSIS — Z9484 Stem cells transplant status: Secondary | ICD-10-CM | POA: Insufficient documentation

## 2019-07-25 DIAGNOSIS — Z7901 Long term (current) use of anticoagulants: Secondary | ICD-10-CM | POA: Diagnosis not present

## 2019-07-25 DIAGNOSIS — C9000 Multiple myeloma not having achieved remission: Secondary | ICD-10-CM | POA: Diagnosis not present

## 2019-07-25 DIAGNOSIS — Z86718 Personal history of other venous thrombosis and embolism: Secondary | ICD-10-CM | POA: Diagnosis not present

## 2019-07-25 DIAGNOSIS — I10 Essential (primary) hypertension: Secondary | ICD-10-CM | POA: Insufficient documentation

## 2019-07-25 DIAGNOSIS — D509 Iron deficiency anemia, unspecified: Secondary | ICD-10-CM | POA: Diagnosis not present

## 2019-07-25 LAB — CMP (CANCER CENTER ONLY)
ALT: 54 U/L — ABNORMAL HIGH (ref 0–44)
AST: 42 U/L — ABNORMAL HIGH (ref 15–41)
Albumin: 4.4 g/dL (ref 3.5–5.0)
Alkaline Phosphatase: 53 U/L (ref 38–126)
Anion gap: 8 (ref 5–15)
BUN: 14 mg/dL (ref 8–23)
CO2: 26 mmol/L (ref 22–32)
Calcium: 9.4 mg/dL (ref 8.9–10.3)
Chloride: 106 mmol/L (ref 98–111)
Creatinine: 0.89 mg/dL (ref 0.61–1.24)
GFR, Est AFR Am: >60 mL/min (ref >60)
GFR, Estimated: >60 mL/min (ref >60)
Glucose, Bld: 91 mg/dL (ref 70–99)
Potassium: 4 mmol/L (ref 3.5–5.1)
Sodium: 140 mmol/L (ref 135–145)
Total Bilirubin: 0.7 mg/dL (ref 0.3–1.2)
Total Protein: 6.7 g/dL (ref 6.5–8.1)

## 2019-07-25 LAB — CBC WITH DIFFERENTIAL (CANCER CENTER ONLY)
Abs Immature Granulocytes: 0.02 10*3/uL (ref 0.00–0.07)
Basophils Absolute: 0 10*3/uL (ref 0.0–0.1)
Basophils Relative: 1 %
Eosinophils Absolute: 0.2 10*3/uL (ref 0.0–0.5)
Eosinophils Relative: 6 %
HCT: 43.9 % (ref 39.0–52.0)
Hemoglobin: 15.2 g/dL (ref 13.0–17.0)
Immature Granulocytes: 1 %
Lymphocytes Relative: 38 %
Lymphs Abs: 1.2 10*3/uL (ref 0.7–4.0)
MCH: 34.9 pg — ABNORMAL HIGH (ref 26.0–34.0)
MCHC: 34.6 g/dL (ref 30.0–36.0)
MCV: 100.7 fL — ABNORMAL HIGH (ref 80.0–100.0)
Monocytes Absolute: 0.5 10*3/uL (ref 0.1–1.0)
Monocytes Relative: 14 %
Neutro Abs: 1.2 10*3/uL — ABNORMAL LOW (ref 1.7–7.7)
Neutrophils Relative %: 40 %
Platelet Count: 126 10*3/uL — ABNORMAL LOW (ref 150–400)
RBC: 4.36 MIL/uL (ref 4.22–5.81)
RDW: 12.4 % (ref 11.5–15.5)
WBC Count: 3.1 10*3/uL — ABNORMAL LOW (ref 4.0–10.5)
nRBC: 0 % (ref 0.0–0.2)

## 2019-07-25 NOTE — Progress Notes (Signed)
Hematology and Oncology Follow Up Visit  Jorge [name] [id number] [birth year] 64 y.o. 07/25/2019   Principle Diagnosis:  IgG Kappa myeloma - Hyperdiploid/+11 DVT of the LEFT and RIGHT leg  Iron deficiency anemia  Current Therapy:   Revlimid '5mg'$  po q day (21 on/7 off) - start on 04/07/2018   Zometa 4 mg IV q 3 months- next dose is 09/2019 Xarelto 10 mg PO daily IV Iron as needed   Interim History:  Jorge [name] is here today for follow-up.  His blood pressure is still is a problem.  He is working with I think his family doctor and maybe even the transplant doctors to try to help with his blood pressure.  His lisinopril dose has been gradually increased.  He had a wonderful Christmas and New Year's.  I think for New Year's, the family went down to [facility].  They had a wonderful time down there.  He has had no problems with pain.  He has been walking a lot.  So far, his myeloma really has not been a problem.  There has been no monoclonal spike in his blood.  He has had no issues with diarrhea.  He has had no fever.    You overall, his performance status is ECOG 1.   Medications:  Allergies as of 07/25/2019   No Known Allergies     Medication List       Accurate as of July 25, 2019 12:30 PM. If you have any questions, ask your nurse or doctor.        acetaminophen 325 MG tablet Commonly known as: TYLENOL Take 2 tablets (650 mg total) by mouth every 6 (six) hours as needed for mild pain (or Fever >/= 101). What changed: how much to take   albuterol 108 (90 Base) MCG/ACT inhaler Commonly known as: VENTOLIN HFA Inhale 2 puffs into the lungs every 6 (six) hours as needed for wheezing or shortness of breath.   amoxicillin 500 MG tablet Commonly known as: AMOXIL Take by mouth.   cetirizine 10 MG tablet Commonly known as: ZYRTEC Take 10 mg by mouth daily as needed for allergies.   colesevelam 625 MG tablet Commonly known as: WELCHOL Take 1 tablet (625 mg  total) by mouth 2 (two) times daily with a meal.   famciclovir 500 MG tablet Commonly known as: FAMVIR TAKE 1 TABLET BY MOUTH  DAILY   fluticasone 50 MCG/ACT nasal spray Commonly known as: FLONASE SPRAY 2 SPRAYS INTO EACH NOSTRIL EVERY DAY   IMODIUM PO Take by mouth as needed.   lenalidomide 5 MG capsule Commonly known as: Revlimid TAKE 1 CAPSULE BY MOUTH  DAILY FOR 21 DAYS ON, THEN  7 DAYS OFF   lidocaine 5 % Commonly known as: LIDODERM Place 1 patch onto the skin daily as needed (pain).   LIPO-FLAVONOID PLUS PO Take 1 tablet by mouth daily.   lisinopril 20 MG tablet Commonly known as: ZESTRIL Take 10 mg by mouth daily.   OVER THE COUNTER MEDICATION Take 1 capsule by mouth daily. methylcare otc supplement   oxyCODONE 5 MG immediate release tablet Commonly known as: Oxy IR/ROXICODONE Take 1-2 tablets (5-10 mg total) by mouth every 6 (six) hours as needed for moderate pain.   pantoprazole 20 MG tablet Commonly known as: Protonix Take 1 tablet (20 mg total) by mouth daily. Please schedule an office visit for more refills   Probiotic Caps Take 1 capsule by mouth daily.   sertraline 25 MG tablet Commonly known  as: ZOLOFT Take 3 tablets (75 mg total) by mouth every evening.   sertraline 100 MG tablet Commonly known as: ZOLOFT Take 100 mg by mouth at bedtime.   Vitamin D3 250 MCG (10000 UT) capsule Take 10,000 Units by mouth daily.   Xarelto 10 MG Tabs tablet Generic drug: rivaroxaban TAKE 1 TABLET BY MOUTH  DAILY   zolpidem 5 MG tablet Commonly known as: AMBIEN Take 1 tablet (5 mg total) by mouth at bedtime.   ZOMETA IV Inject 4 mg into the vein every 3 (three) months. Receives at [name] office.       Allergies: No Known Allergies  Past Medical History, Surgical history, Social history, and Family History were reviewed and updated.  Review of Systems: Review of Systems  Constitutional: Negative.   HENT: Negative.   Eyes: Negative.     Respiratory: Negative.   Cardiovascular: Negative.   Gastrointestinal: Negative.   Genitourinary: Negative.   Musculoskeletal: Positive for myalgias.  Skin: Negative.   Neurological: Negative.   Endo/Heme/Allergies: Negative.   Psychiatric/Behavioral: Negative.      Physical Exam:  weight is 187 lb (84.8 kg). His temporal temperature is 97.8 F (36.6 C). His blood pressure is 147/97 (abnormal) and his pulse is 68. His respiration is 19 and oxygen saturation is 98%.   Wt Readings from Last 3 Encounters:  07/25/19 187 lb (84.8 kg)  06/14/19 186 lb (84.4 kg)  04/26/19 173 lb (78.5 kg)    Physical Exam Vitals signs reviewed.  HENT:     Head: Normocephalic and atraumatic.  Eyes:     Pupils: Pupils are equal, round, and reactive to light.  Neck:     Musculoskeletal: Normal range of motion.  Cardiovascular:     Rate and Rhythm: Normal rate and regular rhythm.     Heart sounds: Normal heart sounds.  Pulmonary:     Effort: Pulmonary effort is normal.     Breath sounds: Normal breath sounds.  Abdominal:     General: Bowel sounds are normal.     Palpations: Abdomen is soft.  Musculoskeletal: Normal range of motion.        General: No tenderness or deformity.  Lymphadenopathy:     Cervical: No cervical adenopathy.  Skin:    General: Skin is warm and dry.     Findings: No erythema or rash.  Neurological:     Mental Status: He is alert and oriented to person, place, and time.  Psychiatric:        Behavior: Behavior normal.        Thought Content: Thought content normal.        Judgment: Judgment normal.      Lab Results  Component Value Date   WBC 3.1 (L) 07/25/2019   HGB 15.2 07/25/2019   HCT 43.9 07/25/2019   MCV 100.7 (H) 07/25/2019   PLT 126 (L) 07/25/2019   Lab Results  Component Value Date   FERRITIN 163 06/14/2019   IRON 151 06/14/2019   TIBC 379 06/14/2019   UIBC 227 06/14/2019   IRONPCTSAT 40 06/14/2019   Lab Results  Component Value Date    RETICCTPCT 2.7 06/08/2018   RBC 4.36 07/25/2019   Lab Results  Component Value Date   KPAFRELGTCHN 29.6 (H) 06/14/2019   LAMBDASER 11.7 06/14/2019   KAPLAMBRATIO 2.53 (H) 06/14/2019   Lab Results  Component Value Date   IGGSERUM 1,100 06/14/2019   IGA 302 06/14/2019   IGMSERUM 28 06/14/2019   Lab Results  Component Value Date   TOTALPROTELP 6.9 06/14/2019   ALBUMINELP 4.2 06/14/2019   A1GS 0.2 06/14/2019   A2GS 0.5 06/14/2019   BETS 1.0 06/14/2019   GAMS 1.0 06/14/2019   MSPIKE Not Observed 06/14/2019   SPEI Comment 01/05/2018     Chemistry      Component Value Date/Time   NA 140 07/25/2019 1118   NA 143 06/30/2017 1049   NA 140 10/03/2016 0845   K 4.0 07/25/2019 1118   K 4.0 06/30/2017 1049   K 4.3 10/03/2016 0845   CL 106 07/25/2019 1118   CL 105 06/30/2017 1049   CO2 26 07/25/2019 1118   CO2 27 06/30/2017 1049   CO2 26 10/03/2016 0845   BUN 14 07/25/2019 1118   BUN 13 06/30/2017 1049   BUN 11.9 10/03/2016 0845   CREATININE 0.89 07/25/2019 1118   CREATININE 1.0 06/30/2017 1049   CREATININE 0.9 10/03/2016 0845      Component Value Date/Time   CALCIUM 9.4 07/25/2019 1118   CALCIUM 9.0 06/30/2017 1049   CALCIUM 9.9 10/03/2016 0845   ALKPHOS 53 07/25/2019 1118   ALKPHOS 57 06/30/2017 1049   ALKPHOS 80 10/03/2016 0845   AST 42 (H) 07/25/2019 1118   AST 28 10/03/2016 0845   ALT 54 (H) 07/25/2019 1118   ALT 43 06/30/2017 1049   ALT 26 10/03/2016 0845   BILITOT 0.7 07/25/2019 1118   BILITOT 0.48 10/03/2016 0845      Impression and Plan: Jorge [name] is a pleasant 63 yo gentleman with IgG kappa myeloma.  He ultimately underwent a autologous stem cell transplant at [facility] in February 2018.   Hopefully, his blood pressure will get under better control.  I am still not sure is warranted that all of a sudden has gotten worse on him.  He will come back now in 2 months.  He will get his Zometa when we see him back.  I am so happy that his grandchildren are doing  well.     [name], MD 1/11/202112:30 PM

## 2019-07-25 NOTE — Telephone Encounter (Signed)
Appointments scheduled patient will get update from My Chart/ per 1/11 los

## 2019-07-26 ENCOUNTER — Other Ambulatory Visit: Payer: Self-pay | Admitting: *Deleted

## 2019-07-26 DIAGNOSIS — C9000 Multiple myeloma not having achieved remission: Secondary | ICD-10-CM

## 2019-07-26 LAB — IGG, IGA, IGM
IgA: 308 mg/dL (ref 61–437)
IgG (Immunoglobin G), Serum: 1036 mg/dL (ref 603–1613)
IgM (Immunoglobulin M), Srm: 26 mg/dL (ref 20–172)

## 2019-07-26 LAB — PROTEIN ELECTROPHORESIS, SERUM, WITH REFLEX
A/G Ratio: 1.4 (ref 0.7–1.7)
Albumin ELP: 4 g/dL (ref 2.9–4.4)
Alpha-1-Globulin: 0.2 g/dL (ref 0.0–0.4)
Alpha-2-Globulin: 0.5 g/dL (ref 0.4–1.0)
Beta Globulin: 1 g/dL (ref 0.7–1.3)
Gamma Globulin: 1.1 g/dL (ref 0.4–1.8)
Globulin, Total: 2.8 g/dL (ref 2.2–3.9)
Total Protein ELP: 6.8 g/dL (ref 6.0–8.5)

## 2019-07-26 LAB — KAPPA/LAMBDA LIGHT CHAINS
Kappa free light chain: 28.5 mg/L — ABNORMAL HIGH (ref 3.3–19.4)
Kappa, lambda light chain ratio: 2.18 — ABNORMAL HIGH (ref 0.26–1.65)
Lambda free light chains: 13.1 mg/L (ref 5.7–26.3)

## 2019-07-26 MED ORDER — LENALIDOMIDE 5 MG PO CAPS: ORAL_CAPSULE | ORAL | 0 refills | Status: DC

## 2019-08-10 ENCOUNTER — Other Ambulatory Visit: Payer: Self-pay | Admitting: Gastroenterology

## 2019-08-11 ENCOUNTER — Telehealth: Payer: Self-pay | Admitting: *Deleted

## 2019-08-11 ENCOUNTER — Other Ambulatory Visit: Payer: Self-pay | Admitting: *Deleted

## 2019-08-11 MED ORDER — AMOXICILLIN 500 MG PO CAPS: 1000.0000 mg | ORAL_CAPSULE | ORAL | 0 refills | Status: DC

## 2019-08-11 MED ORDER — AMOXICILLIN 500 MG PO TABS: ORAL_TABLET | ORAL | 0 refills | Status: DC

## 2019-08-11 NOTE — Telephone Encounter (Signed)
Message received from patient requesting a refill of Amoxicillin 2000 mg PO prior to dental procedure next week.  Refill sent per order of Dr. Marin Olp.

## 2019-08-17 ENCOUNTER — Other Ambulatory Visit: Payer: Self-pay | Admitting: Hematology & Oncology

## 2019-08-17 DIAGNOSIS — C9000 Multiple myeloma not having achieved remission: Secondary | ICD-10-CM

## 2019-08-19 ENCOUNTER — Other Ambulatory Visit: Payer: Self-pay | Admitting: Psychiatry

## 2019-08-19 DIAGNOSIS — F411 Generalized anxiety disorder: Secondary | ICD-10-CM

## 2019-08-24 ENCOUNTER — Other Ambulatory Visit: Payer: Self-pay | Admitting: *Deleted

## 2019-08-24 DIAGNOSIS — C9000 Multiple myeloma not having achieved remission: Secondary | ICD-10-CM

## 2019-08-24 MED ORDER — LENALIDOMIDE 5 MG PO CAPS: ORAL_CAPSULE | ORAL | 0 refills | Status: DC

## 2019-09-05 ENCOUNTER — Other Ambulatory Visit: Payer: Self-pay | Admitting: *Deleted

## 2019-09-06 ENCOUNTER — Inpatient Hospital Stay: Payer: Medicare Other | Attending: Hematology & Oncology

## 2019-09-06 ENCOUNTER — Other Ambulatory Visit: Payer: Self-pay

## 2019-09-09 ENCOUNTER — Other Ambulatory Visit: Payer: Self-pay

## 2019-09-09 DIAGNOSIS — G4701 Insomnia due to medical condition: Secondary | ICD-10-CM

## 2019-09-09 DIAGNOSIS — G8929 Other chronic pain: Secondary | ICD-10-CM

## 2019-09-09 MED ORDER — ZOLPIDEM TARTRATE 5 MG PO TABS: 5.0000 mg | ORAL_TABLET | Freq: Every day | ORAL | 2 refills | Status: DC

## 2019-09-20 ENCOUNTER — Other Ambulatory Visit: Payer: Self-pay | Admitting: Hematology & Oncology

## 2019-09-20 DIAGNOSIS — C9000 Multiple myeloma not having achieved remission: Secondary | ICD-10-CM

## 2019-09-21 ENCOUNTER — Telehealth: Payer: Self-pay | Admitting: Medical

## 2019-09-21 NOTE — Telephone Encounter (Addendum)
About the form psychiatric question for gun permit. I see he has saw psychiatrist in 05/2019. I would ask him to see if psychiatrist would fill out the form.  If they won't then I could see him but his psychiatrist would need to release some copy of office notes when he saw pt in past. [place] like county [place] records

## 2019-09-22 ENCOUNTER — Other Ambulatory Visit: Payer: Self-pay | Admitting: *Deleted

## 2019-09-22 ENCOUNTER — Other Ambulatory Visit: Payer: Self-pay

## 2019-09-22 ENCOUNTER — Inpatient Hospital Stay (HOSPITAL_BASED_OUTPATIENT_CLINIC_OR_DEPARTMENT_OTHER): Payer: Medicare Other | Admitting: Hematology & Oncology

## 2019-09-22 ENCOUNTER — Encounter: Payer: Self-pay | Admitting: Hematology & Oncology

## 2019-09-22 ENCOUNTER — Inpatient Hospital Stay: Payer: Medicare Other

## 2019-09-22 ENCOUNTER — Inpatient Hospital Stay: Payer: Medicare Other | Attending: Hematology & Oncology

## 2019-09-22 VITALS — BP 133/86 | HR 60 | Temp 96.9°F | Resp 16 | Wt 187.0 lb

## 2019-09-22 DIAGNOSIS — C9000 Multiple myeloma not having achieved remission: Secondary | ICD-10-CM

## 2019-09-22 DIAGNOSIS — Z7901 Long term (current) use of anticoagulants: Secondary | ICD-10-CM | POA: Insufficient documentation

## 2019-09-22 DIAGNOSIS — Z9484 Stem cells transplant status: Secondary | ICD-10-CM | POA: Insufficient documentation

## 2019-09-22 DIAGNOSIS — C9001 Multiple myeloma in remission: Secondary | ICD-10-CM

## 2019-09-22 DIAGNOSIS — Z86718 Personal history of other venous thrombosis and embolism: Secondary | ICD-10-CM | POA: Insufficient documentation

## 2019-09-22 DIAGNOSIS — D509 Iron deficiency anemia, unspecified: Secondary | ICD-10-CM | POA: Diagnosis not present

## 2019-09-22 DIAGNOSIS — D500 Iron deficiency anemia secondary to blood loss (chronic): Secondary | ICD-10-CM

## 2019-09-22 LAB — CBC WITH DIFFERENTIAL (CANCER CENTER ONLY)
Abs Immature Granulocytes: 0.01 10*3/uL (ref 0.00–0.07)
Basophils Absolute: 0 10*3/uL (ref 0.0–0.1)
Basophils Relative: 1 %
Eosinophils Absolute: 0.2 10*3/uL (ref 0.0–0.5)
Eosinophils Relative: 8 %
HCT: 44.8 % (ref 39.0–52.0)
Hemoglobin: 15.4 g/dL (ref 13.0–17.0)
Immature Granulocytes: 0 %
Lymphocytes Relative: 45 %
Lymphs Abs: 1.3 10*3/uL (ref 0.7–4.0)
MCH: 34.5 pg — ABNORMAL HIGH (ref 26.0–34.0)
MCHC: 34.4 g/dL (ref 30.0–36.0)
MCV: 100.2 fL — ABNORMAL HIGH (ref 80.0–100.0)
Monocytes Absolute: 0.4 10*3/uL (ref 0.1–1.0)
Monocytes Relative: 14 %
Neutro Abs: 0.9 10*3/uL — ABNORMAL LOW (ref 1.7–7.7)
Neutrophils Relative %: 32 %
Platelet Count: 134 10*3/uL — ABNORMAL LOW (ref 150–400)
RBC: 4.47 MIL/uL (ref 4.22–5.81)
RDW: 13.2 % (ref 11.5–15.5)
WBC Count: 2.8 10*3/uL — ABNORMAL LOW (ref 4.0–10.5)
nRBC: 0 % (ref 0.0–0.2)

## 2019-09-22 LAB — CMP (CANCER CENTER ONLY)
ALT: 59 U/L — ABNORMAL HIGH (ref 0–44)
AST: 47 U/L — ABNORMAL HIGH (ref 15–41)
Albumin: 4.7 g/dL (ref 3.5–5.0)
Alkaline Phosphatase: 55 U/L (ref 38–126)
Anion gap: 8 (ref 5–15)
BUN: 14 mg/dL (ref 8–23)
CO2: 27 mmol/L (ref 22–32)
Calcium: 9.9 mg/dL (ref 8.9–10.3)
Chloride: 105 mmol/L (ref 98–111)
Creatinine: 0.89 mg/dL (ref 0.61–1.24)
GFR, Est AFR Am: >60 mL/min (ref >60)
GFR, Estimated: >60 mL/min (ref >60)
Glucose, Bld: 109 mg/dL — ABNORMAL HIGH (ref 70–99)
Potassium: 4.2 mmol/L (ref 3.5–5.1)
Sodium: 140 mmol/L (ref 135–145)
Total Bilirubin: 0.8 mg/dL (ref 0.3–1.2)
Total Protein: 7.1 g/dL (ref 6.5–8.1)

## 2019-09-22 MED ORDER — LENALIDOMIDE 5 MG PO CAPS: ORAL_CAPSULE | ORAL | 0 refills | Status: DC

## 2019-09-22 MED ORDER — ZOLEDRONIC ACID 4 MG/100ML IV SOLN
4.0000 mg | Freq: Once | INTRAVENOUS | Status: AC
  Administered 2019-09-22: 4 mg via INTRAVENOUS
  Filled 2019-09-22: qty 100

## 2019-09-22 MED ORDER — SODIUM CHLORIDE 0.9 % IV SOLN
Freq: Once | INTRAVENOUS | Status: AC
  Filled 2019-09-22: qty 250

## 2019-09-22 NOTE — Patient Instructions (Signed)
Zoledronic Acid injection (Hypercalcemia, Oncology) What is this medicine? ZOLEDRONIC ACID (ZOE le dron ik AS id) lowers the amount of calcium loss from bone. It is used to treat too much calcium in your blood from cancer. It is also used to prevent complications of cancer that has spread to the bone. This medicine may be used for other purposes; ask your health care provider or pharmacist if you have questions. COMMON BRAND NAME(S): Zometa What should I tell my health care provider before I take this medicine? They need to know if you have any of these conditions:  aspirin-sensitive asthma  cancer, especially if you are receiving medicines used to treat cancer  dental disease or wear dentures  infection  kidney disease  receiving corticosteroids like dexamethasone or prednisone  an unusual or allergic reaction to zoledronic acid, other medicines, foods, dyes, or preservatives  pregnant or trying to get pregnant  breast-feeding How should I use this medicine? This medicine is for infusion into a vein. It is given by a health care professional in a hospital or clinic setting. Talk to your pediatrician regarding the use of this medicine in children. Special care may be needed. Overdosage: If you think you have taken too much of this medicine contact a poison control center or emergency room at once. NOTE: This medicine is only for you. Do not share this medicine with others. What if I miss a dose? It is important not to miss your dose. Call your doctor or health care professional if you are unable to keep an appointment. What may interact with this medicine?  certain antibiotics given by injection  NSAIDs, medicines for pain and inflammation, like ibuprofen or naproxen  some diuretics like bumetanide, furosemide  teriparatide  thalidomide This list may not describe all possible interactions. Give your health care provider a list of all the medicines, herbs, non-prescription  drugs, or dietary supplements you use. Also tell them if you smoke, drink alcohol, or use illegal drugs. Some items may interact with your medicine. What should I watch for while using this medicine? Visit your doctor or health care professional for regular checkups. It may be some time before you see the benefit from this medicine. Do not stop taking your medicine unless your doctor tells you to. Your doctor may order blood tests or other tests to see how you are doing. Women should inform their doctor if they wish to become pregnant or think they might be pregnant. There is a potential for serious side effects to an unborn child. Talk to your health care professional or pharmacist for more information. You should make sure that you get enough calcium and vitamin D while you are taking this medicine. Discuss the foods you eat and the vitamins you take with your health care professional. Some people who take this medicine have severe bone, joint, and/or muscle pain. This medicine may also increase your risk for jaw problems or a broken thigh bone. Tell your doctor right away if you have severe pain in your jaw, bones, joints, or muscles. Tell your doctor if you have any pain that does not go away or that gets worse. Tell your dentist and dental surgeon that you are taking this medicine. You should not have major dental surgery while on this medicine. See your dentist to have a dental exam and fix any dental problems before starting this medicine. Take good care of your teeth while on this medicine. Make sure you see your dentist for regular follow-up   appointments. What side effects may I notice from receiving this medicine? Side effects that you should report to your doctor or health care professional as soon as possible:  allergic reactions like skin rash, itching or hives, swelling of the face, lips, or tongue  anxiety, confusion, or depression  breathing problems  changes in vision  eye  pain  feeling faint or lightheaded, falls  jaw pain, especially after dental work  mouth sores  muscle cramps, stiffness, or weakness  redness, blistering, peeling or loosening of the skin, including inside the mouth  trouble passing urine or change in the amount of urine Side effects that usually do not require medical attention (report to your doctor or health care professional if they continue or are bothersome):  bone, joint, or muscle pain  constipation  diarrhea  fever  hair loss  irritation at site where injected  loss of appetite  nausea, vomiting  stomach upset  trouble sleeping  trouble swallowing  weak or tired This list may not describe all possible side effects. Call your doctor for medical advice about side effects. You may report side effects to FDA at 1-800-FDA-1088. Where should I keep my medicine? This drug is given in a hospital or clinic and will not be stored at home. NOTE: This sheet is a summary. It may not cover all possible information. If you have questions about this medicine, talk to your doctor, pharmacist, or health care provider.  2020 Elsevier/Gold Standard (2013-11-26 14:19:39)  

## 2019-09-22 NOTE — Telephone Encounter (Signed)
Called patient but lvm to return my call

## 2019-09-22 NOTE — Progress Notes (Signed)
Hematology and Oncology Follow Up Visit  Jorge [name] [id number] 01/28/1956 64 y.o. 09/22/2019   Principle Diagnosis:  IgG Kappa myeloma - Hyperdiploid/+11 DVT of the LEFT and RIGHT leg  Iron deficiency anemia  Current Therapy:   Revlimid 41m po q day (21 on/7 off) - start on 04/07/2018   Zometa 4 mg IV q 3 months- next dose is 12/2019 Xarelto 10 mg PO daily IV Iron as needed   Interim History:  Jorge [name] here today for follow-up.  He really looks good.  He feels good.  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  I am incredibly impressed by his abilities.  He is incredibly talented and has a lot to say and write.  [name]  [name] not feel that there is any need to make any changes with his maintenance program.  There is been no fever.  He has had no bleeding.  He has had no abdominal pain.  He has been walking 3 or 4 miles a day with the warmer weather.  He has 2 granddaughters now.  He really is excited about this.  Hopefully, they will be coming over to his house for Easter Sunday.  There is been no leg swelling.  His blood pressure is doing a lot better.  He is on Xarelto at a low dose.  This seems to be working nicely for him.    His last monoclonal studies back in January did not show a monoclonal spike in his blood.  His last iron studies back in December showed a ferritin of 163 with an iron saturation of 40%.    Overall, his performance status is ECOG 1.   Medications:  Allergies as of 09/22/2019   No Known Allergies     Medication List       Accurate as of September 22, 2019  9:15 AM. If you have any questions, ask your nurse or doctor.        STOP taking these medications   colesevelam 625 MG tablet Commonly known as: WELCHOL Stopped by: [name]     TAKE these medications   acetaminophen 325 MG tablet Commonly known as: TYLENOL Take 2 tablets (650 mg total) by mouth every 6 (six)  hours as needed for mild pain (or Fever >/= 101). What changed: how much to take   albuterol 108 (90 Base) MCG/ACT inhaler Commonly known as: VENTOLIN HFA Inhale 2 puffs into the lungs every 6 (six) hours as needed for wheezing or shortness of breath.   amLODipine 10 MG tablet Commonly known as: NORVASC Take 10 mg by mouth daily.   amoxicillin 500 MG tablet Commonly known as: AMOXIL Take one hour before dental procedure What changed: Another medication with the same name was removed. Continue taking this medication, and follow the directions you see here. Changed by: [name]   cetirizine 10 MG tablet Commonly known as: ZYRTEC Take 10 mg by mouth daily as needed for allergies.   famciclovir 500 MG tablet Commonly known as: FAMVIR TAKE 1 TABLET BY MOUTH  DAILY   fluorouracil 5 % cream Commonly known as: EFUDEX APPLY TO AFFECTED AREA TWICE A DAY   fluticasone 50 MCG/ACT nasal spray Commonly known as: FLONASE SPRAY 2 SPRAYS INTO EACH NOSTRIL EVERY DAY   IMODIUM PO Take by mouth as needed.   LOPERAMIDE HCL PO Take by mouth daily.   lenalidomide  5 MG capsule Commonly known as: Revlimid TAKE 1 CAPSULE BY MOUTH  DAILY FOR 21 DAYS ON, THEN  7 DAYS OFF   lidocaine 5 % Commonly known as: LIDODERM Place 1 patch onto the skin daily as needed (pain).   LIPO-FLAVONOID PLUS PO Take 1 tablet by mouth daily.   lisinopril 40 MG tablet Commonly known as: ZESTRIL Take 40 mg by mouth daily. What changed: Another medication with the same name was removed. Continue taking this medication, and follow the directions you see here. Changed by: [name], MD   OVER THE COUNTER MEDICATION Take 1 capsule by mouth daily. methylcare otc supplement   oxyCODONE 5 MG immediate release tablet Commonly known as: Oxy IR/ROXICODONE Take 1-2 tablets (5-10 mg total) by mouth every 6 (six) hours as needed for moderate pain.   pantoprazole 20 MG tablet Commonly known as:  PROTONIX TAKE 1 TABLET BY MOUTH  DAILY   Probiotic Caps Take 1 capsule by mouth daily.   sertraline 25 MG tablet Commonly known as: ZOLOFT Take 75 mg by mouth daily. What changed: Another medication with the same name was removed. Continue taking this medication, and follow the directions you see here. Changed by: [name], MD   Vitamin D3 250 MCG (10000 UT) capsule Take 10,000 Units by mouth daily.   Xarelto 10 MG Tabs tablet Generic drug: rivaroxaban TAKE 1 TABLET BY MOUTH  DAILY   zolpidem 5 MG tablet Commonly known as: AMBIEN Take 1 tablet (5 mg total) by mouth at bedtime. What changed:   when to take this  reasons to take this   ZOMETA IV Inject 4 mg into the vein every 3 (three) months. Receives at [name] office.       Allergies: No Known Allergies  Past Medical History, Surgical history, Social history, and Family History were reviewed and updated.  Review of Systems: Review of Systems  Constitutional: Negative.   HENT: Negative.   Eyes: Negative.   Respiratory: Negative.   Cardiovascular: Negative.   Gastrointestinal: Negative.   Genitourinary: Negative.   Musculoskeletal: Positive for myalgias.  Skin: Negative.   Neurological: Negative.   Endo/Heme/Allergies: Negative.   Psychiatric/Behavioral: Negative.      Physical Exam:  weight is 187 lb (84.8 kg). His temporal temperature is 96.9 F (36.1 C) (abnormal). His blood pressure is 133/86 and his pulse is 60. His respiration is 16 and oxygen saturation is 96%.   Wt Readings from Last 3 Encounters:  09/22/19 187 lb (84.8 kg)  07/25/19 187 lb (84.8 kg)  06/14/19 186 lb (84.4 kg)    Physical Exam Vitals signs reviewed.  HENT:     Head: Normocephalic and atraumatic.  Eyes:     Pupils: Pupils are equal, round, and reactive to light.  Neck:     Musculoskeletal: Normal range of motion.  Cardiovascular:     Rate and Rhythm: Normal rate and regular rhythm.     Heart sounds: Normal  heart sounds.  Pulmonary:     Effort: Pulmonary effort is normal.     Breath sounds: Normal breath sounds.  Abdominal:     General: Bowel sounds are normal.     Palpations: Abdomen is soft.  Musculoskeletal: Normal range of motion.        General: No tenderness or deformity.  Lymphadenopathy:     Cervical: No cervical adenopathy.  Skin:    General: Skin is warm and dry.     Findings: No erythema or rash.  Neurological:     Mental Status: He is alert and oriented to person, place, and time.  Psychiatric:        Behavior: Behavior normal.        Thought Content: Thought content normal.        Judgment: Judgment normal.      Lab Results  Component Value Date   WBC 2.8 (L) 09/22/2019   HGB 15.4 09/22/2019   HCT 44.8 09/22/2019   MCV 100.2 (H) 09/22/2019   PLT 134 (L) 09/22/2019   Lab Results  Component Value Date   FERRITIN 163 06/14/2019   IRON 151 06/14/2019   TIBC 379 06/14/2019   UIBC 227 06/14/2019   IRONPCTSAT 40 06/14/2019   Lab Results  Component Value Date   RETICCTPCT 2.7 06/08/2018   RBC 4.47 09/22/2019   Lab Results  Component Value Date   KPAFRELGTCHN 28.5 (H) 07/25/2019   LAMBDASER 13.1 07/25/2019   KAPLAMBRATIO 2.18 (H) 07/25/2019   Lab Results  Component Value Date   IGGSERUM 1,036 07/25/2019   IGA 308 07/25/2019   IGMSERUM 26 07/25/2019   Lab Results  Component Value Date   TOTALPROTELP 6.8 07/25/2019   ALBUMINELP 4.0 07/25/2019   A1GS 0.2 07/25/2019   A2GS 0.5 07/25/2019   BETS 1.0 07/25/2019   GAMS 1.1 07/25/2019   MSPIKE Not Observed 07/25/2019   SPEI Comment 01/05/2018     Chemistry      Component Value Date/Time   NA 140 07/25/2019 1118   NA 143 06/30/2017 1049   NA 140 10/03/2016 0845   K 4.0 07/25/2019 1118   K 4.0 06/30/2017 1049   K 4.3 10/03/2016 0845   CL 106 07/25/2019 1118   CL 105 06/30/2017 1049   CO2 26 07/25/2019 1118   CO2 27 06/30/2017 1049   CO2 26 10/03/2016 0845   BUN 14 07/25/2019 1118   BUN 13  06/30/2017 1049   BUN 11.9 10/03/2016 0845   CREATININE 0.89 07/25/2019 1118   CREATININE 1.0 06/30/2017 1049   CREATININE 0.9 10/03/2016 0845      Component Value Date/Time   CALCIUM 9.4 07/25/2019 1118   CALCIUM 9.0 06/30/2017 1049   CALCIUM 9.9 10/03/2016 0845   ALKPHOS 53 07/25/2019 1118   ALKPHOS 57 06/30/2017 1049   ALKPHOS 80 10/03/2016 0845   AST 42 (H) 07/25/2019 1118   AST 28 10/03/2016 0845   ALT 54 (H) 07/25/2019 1118   ALT 43 06/30/2017 1049   ALT 26 10/03/2016 0845   BILITOT 0.7 07/25/2019 1118   BILITOT 0.48 10/03/2016 0845      Impression and Plan: Mr. Mcevers is a pleasant 65 yo gentleman with IgG kappa myeloma.  He ultimately underwent a autologous stem cell transplant at [facility] in February 2018.   I am just happy that everything is going well for him right now.  He really has done quite nicely.  I would think that his iron studies should be okay.  His white blood cell count is down a little bit but not low enough that should cause any problems for him.  He will get his Zometa today.  I think we can probably move his appointments out to every 3 months now.  If he has any problems he certainly knows to give [place] a call.    [name], MD 3/11/20219:15 AM

## 2019-09-22 NOTE — Telephone Encounter (Signed)
Patient said he will get office notes from his office visit on 05/2019 with psychiatrist

## 2019-09-23 ENCOUNTER — Telehealth: Payer: Self-pay | Admitting: Hematology & Oncology

## 2019-09-23 LAB — PROTEIN ELECTROPHORESIS, SERUM, WITH REFLEX
A/G Ratio: 1.5 (ref 0.7–1.7)
Albumin ELP: 4.1 g/dL (ref 2.9–4.4)
Alpha-1-Globulin: 0.2 g/dL (ref 0.0–0.4)
Alpha-2-Globulin: 0.5 g/dL (ref 0.4–1.0)
Beta Globulin: 1 g/dL (ref 0.7–1.3)
Gamma Globulin: 1 g/dL (ref 0.4–1.8)
Globulin, Total: 2.7 g/dL (ref 2.2–3.9)
Total Protein ELP: 6.8 g/dL (ref 6.0–8.5)

## 2019-09-23 LAB — IGG, IGA, IGM
IgA: 287 mg/dL (ref 61–437)
IgG (Immunoglobin G), Serum: 1016 mg/dL (ref 603–1613)
IgM (Immunoglobulin M), Srm: 30 mg/dL (ref 20–172)

## 2019-09-23 LAB — KAPPA/LAMBDA LIGHT CHAINS
Kappa free light chain: 29 mg/L — ABNORMAL HIGH (ref 3.3–19.4)
Kappa, lambda light chain ratio: 1.97 — ABNORMAL HIGH (ref 0.26–1.65)
Lambda free light chains: 14.7 mg/L (ref 5.7–26.3)

## 2019-09-23 NOTE — Telephone Encounter (Signed)
Patient advised of appointments scheduled per 3/11 los

## 2019-09-29 ENCOUNTER — Other Ambulatory Visit (HOSPITAL_COMMUNITY): Payer: Medicare Other

## 2019-09-29 ENCOUNTER — Other Ambulatory Visit (HOSPITAL_COMMUNITY): Payer: Self-pay | Admitting: *Deleted

## 2019-09-29 DIAGNOSIS — I5189 Other ill-defined heart diseases: Secondary | ICD-10-CM

## 2019-10-04 ENCOUNTER — Encounter (HOSPITAL_COMMUNITY): Payer: Self-pay

## 2019-10-06 ENCOUNTER — Other Ambulatory Visit: Payer: Self-pay

## 2019-10-06 ENCOUNTER — Ambulatory Visit (HOSPITAL_COMMUNITY)
Admission: RE | Admit: 2019-10-06 | Discharge: 2019-10-06 | Disposition: A | Discharge status: Home / Self Care | Payer: Medicare Other | Source: Ambulatory Visit | Attending: Registered Nurse | Admitting: Registered Nurse

## 2019-10-06 DIAGNOSIS — I5189 Other ill-defined heart diseases: Secondary | ICD-10-CM

## 2019-10-06 MED ORDER — GADOBUTROL 1 MMOL/ML IV SOLN
10.0000 mL | Freq: Once | INTRAVENOUS | Status: AC | PRN
  Administered 2019-10-06: 10 mL via INTRAVENOUS

## 2019-10-11 ENCOUNTER — Other Ambulatory Visit: Payer: Self-pay | Admitting: *Deleted

## 2019-10-11 DIAGNOSIS — C9000 Multiple myeloma not having achieved remission: Secondary | ICD-10-CM

## 2019-10-11 MED ORDER — LENALIDOMIDE 5 MG PO CAPS: ORAL_CAPSULE | ORAL | 0 refills | Status: DC

## 2019-10-17 ENCOUNTER — Telehealth: Payer: Self-pay

## 2019-10-17 NOTE — Telephone Encounter (Signed)
Patient called in regards to his gun permit paperwork

## 2019-10-18 ENCOUNTER — Telehealth: Payer: Self-pay | Admitting: Medical

## 2019-10-18 NOTE — Telephone Encounter (Signed)
Let pt know I want phone or virtual tomorrow.Want to review gun permit form and go over with him how will fill out form.

## 2019-10-18 NOTE — Telephone Encounter (Signed)
Vir[facility]al is set for Thursday AM , he works all day tomorrow

## 2019-10-19 ENCOUNTER — Other Ambulatory Visit: Payer: Self-pay

## 2019-10-19 ENCOUNTER — Ambulatory Visit (INDEPENDENT_AMBULATORY_CARE_PROVIDER_SITE_OTHER): Payer: Medicare Other | Admitting: Medical

## 2019-10-19 VITALS — BP 129/85 | HR 65

## 2019-10-19 DIAGNOSIS — F419 Anxiety disorder, unspecified: Secondary | ICD-10-CM

## 2019-10-19 DIAGNOSIS — F329 Major depressive disorder, single episode, unspecified: Secondary | ICD-10-CM

## 2019-10-19 DIAGNOSIS — F32A Depression, unspecified: Secondary | ICD-10-CM

## 2019-10-19 NOTE — Progress Notes (Signed)
   Subjective:    Patient ID: Jorge [name], male    DOB: 12/11/1955, 64 y.o.   MRN: [id number]  HPI  Virtual Visit via Video Note  I connected with [name] on 10/19/19 at  4:40 PM EDT by a video enabled telemedicine application and verified that I am speaking with the correct person using two identifiers.  Location: Patient: home Provider: office.   I discussed the limitations of evaluation and management by telemedicine and the availability of in person appointments. The patient expressed understanding and agreed to proceed.  History of Present Illness:   Pt wants me to fill out form for concealed carry permit.  Pt has seen psychiatrist for depression, insomnia and anxiety. Pt has been doing well. In the past he was on sertaline 100 mg. Now 50 mg daily dose. Pt uses ambien very sporadically. Has not taken any for 3 weeks.   I have most recent note from psychiatrist to review and fill out form.   He has multiple myeloma  and depression/anxiety starte around time of MM diagnosed.  He has not had any thoughts of harm to self or others and denies any thoughts in the past as well.   Pt has one shotgun and 9 mm [place]er pistol.   He had concealed carry permit in past but license expired in past when he was was very busy with MM treatments.   Currently seeing psychiatrist once a year.  Observations/Objective: General-no acute distress, pleasant, oriented.  Lungs- on inspection lungs appear unlabored. Neck- no tracheal deviation or jvd on inspection. Neuro- gross motor function appears intact.  Assessment and Plan: Patient has history of depression, anxiety and associated insomnia.  On review of recent psychiatrist note and discussion with patient today he has been stable.  I did go ahead and [place].  Noted on the form that psychiatrist could fill out form as well if [place] request/requires.  Also will send copy of last psychiatrist note  [place] once relevant records.  Follow-up as regular scheduled or as needed.  Follow Up Instructions:    I discussed the assessment and treatment plan with the patient. The patient was provided an opportunity to ask questions and all were answered. The patient agreed with the plan and demonstrated an understanding of the instructions.   The patient was advised to call back or seek an in-person evaluation if the symptoms worsen or if the condition fails to improve as anticipated.  I provided 25 minutes of non-face-to-face time during this encounter.   Mackie Pai, PA-C    Review of Systems     Objective:   Physical Exam        Assessment & Plan:

## 2019-10-19 NOTE — Patient Instructions (Addendum)
Patient has history of depression, anxiety and associated insomnia.  On review of recent psychiatrist note and discussion with patient today he has been stable.  I did go ahead and fill out [place] health [place] form.  Noted on the form that psychiatrist could fill out form as well if [place] request/requires.  Also will send copy of last psychiatrist note [place] once relevant records.  Follow-up as regular scheduled or as needed.

## 2019-10-20 ENCOUNTER — Ambulatory Visit: Payer: Medicare Other | Admitting: Medical

## 2019-10-27 ENCOUNTER — Other Ambulatory Visit (HOSPITAL_COMMUNITY): Payer: Medicare Other

## 2019-11-13 ENCOUNTER — Other Ambulatory Visit: Payer: Self-pay | Admitting: Psychiatry

## 2019-11-13 DIAGNOSIS — F411 Generalized anxiety disorder: Secondary | ICD-10-CM

## 2019-11-15 ENCOUNTER — Other Ambulatory Visit: Payer: Self-pay | Admitting: *Deleted

## 2019-11-15 DIAGNOSIS — C9000 Multiple myeloma not having achieved remission: Secondary | ICD-10-CM

## 2019-11-15 MED ORDER — LENALIDOMIDE 5 MG PO CAPS: ORAL_CAPSULE | ORAL | 0 refills | Status: DC

## 2019-12-05 ENCOUNTER — Encounter: Payer: Self-pay | Admitting: Hematology & Oncology

## 2019-12-15 ENCOUNTER — Other Ambulatory Visit: Payer: Self-pay | Admitting: *Deleted

## 2019-12-15 ENCOUNTER — Other Ambulatory Visit: Payer: Self-pay | Admitting: Hematology & Oncology

## 2019-12-15 DIAGNOSIS — C9000 Multiple myeloma not having achieved remission: Secondary | ICD-10-CM

## 2019-12-15 MED ORDER — LENALIDOMIDE 5 MG PO CAPS: ORAL_CAPSULE | ORAL | 0 refills | Status: DC

## 2019-12-23 ENCOUNTER — Encounter: Payer: Self-pay | Admitting: Hematology & Oncology

## 2019-12-23 ENCOUNTER — Inpatient Hospital Stay: Payer: Medicare Other

## 2019-12-23 ENCOUNTER — Other Ambulatory Visit: Payer: Self-pay

## 2019-12-23 ENCOUNTER — Inpatient Hospital Stay (HOSPITAL_BASED_OUTPATIENT_CLINIC_OR_DEPARTMENT_OTHER): Payer: Medicare Other | Admitting: Hematology & Oncology

## 2019-12-23 ENCOUNTER — Inpatient Hospital Stay: Payer: Medicare Other | Attending: Hematology & Oncology

## 2019-12-23 VITALS — BP 125/86 | HR 70 | Temp 96.4°F | Resp 18 | Ht 67.0 in | Wt 189.1 lb

## 2019-12-23 DIAGNOSIS — Z79899 Other long term (current) drug therapy: Secondary | ICD-10-CM | POA: Insufficient documentation

## 2019-12-23 DIAGNOSIS — R0989 Other specified symptoms and signs involving the circulatory and respiratory systems: Secondary | ICD-10-CM | POA: Diagnosis not present

## 2019-12-23 DIAGNOSIS — D500 Iron deficiency anemia secondary to blood loss (chronic): Secondary | ICD-10-CM

## 2019-12-23 DIAGNOSIS — Z9484 Stem cells transplant status: Secondary | ICD-10-CM | POA: Diagnosis not present

## 2019-12-23 DIAGNOSIS — C9001 Multiple myeloma in remission: Secondary | ICD-10-CM

## 2019-12-23 DIAGNOSIS — D509 Iron deficiency anemia, unspecified: Secondary | ICD-10-CM | POA: Insufficient documentation

## 2019-12-23 DIAGNOSIS — C9000 Multiple myeloma not having achieved remission: Secondary | ICD-10-CM | POA: Diagnosis not present

## 2019-12-23 DIAGNOSIS — Z7901 Long term (current) use of anticoagulants: Secondary | ICD-10-CM | POA: Insufficient documentation

## 2019-12-23 DIAGNOSIS — Z86718 Personal history of other venous thrombosis and embolism: Secondary | ICD-10-CM | POA: Diagnosis not present

## 2019-12-23 LAB — CMP (CANCER CENTER ONLY)
ALT: 50 U/L — ABNORMAL HIGH (ref 0–44)
AST: 43 U/L — ABNORMAL HIGH (ref 15–41)
Albumin: 4.2 g/dL (ref 3.5–5.0)
Alkaline Phosphatase: 53 U/L (ref 38–126)
Anion gap: 8 (ref 5–15)
BUN: 12 mg/dL (ref 8–23)
CO2: 25 mmol/L (ref 22–32)
Calcium: 9.7 mg/dL (ref 8.9–10.3)
Chloride: 106 mmol/L (ref 98–111)
Creatinine: 0.86 mg/dL (ref 0.61–1.24)
GFR, Est AFR Am: >60 mL/min (ref >60)
GFR, Estimated: >60 mL/min (ref >60)
Glucose, Bld: 108 mg/dL — ABNORMAL HIGH (ref 70–99)
Potassium: 4.3 mmol/L (ref 3.5–5.1)
Sodium: 139 mmol/L (ref 135–145)
Total Bilirubin: 0.6 mg/dL (ref 0.3–1.2)
Total Protein: 7 g/dL (ref 6.5–8.1)

## 2019-12-23 LAB — RETICULOCYTES
Immature Retic Fract: 18.7 % — ABNORMAL HIGH (ref 2.3–15.9)
RBC.: 4.37 MIL/uL (ref 4.22–5.81)
Retic Count, Absolute: 87 10*3/uL (ref 19.0–186.0)
Retic Ct Pct: 2 % (ref 0.4–3.1)

## 2019-12-23 LAB — CBC WITH DIFFERENTIAL (CANCER CENTER ONLY)
Abs Immature Granulocytes: 0.01 10*3/uL (ref 0.00–0.07)
Basophils Absolute: 0 10*3/uL (ref 0.0–0.1)
Basophils Relative: 1 %
Eosinophils Absolute: 0.3 10*3/uL (ref 0.0–0.5)
Eosinophils Relative: 10 %
HCT: 43.9 % (ref 39.0–52.0)
Hemoglobin: 15.3 g/dL (ref 13.0–17.0)
Immature Granulocytes: 0 %
Lymphocytes Relative: 46 %
Lymphs Abs: 1.3 10*3/uL (ref 0.7–4.0)
MCH: 35.3 pg — ABNORMAL HIGH (ref 26.0–34.0)
MCHC: 34.9 g/dL (ref 30.0–36.0)
MCV: 101.2 fL — ABNORMAL HIGH (ref 80.0–100.0)
Monocytes Absolute: 0.4 10*3/uL (ref 0.1–1.0)
Monocytes Relative: 13 %
Neutro Abs: 0.9 10*3/uL — ABNORMAL LOW (ref 1.7–7.7)
Neutrophils Relative %: 30 %
Platelet Count: 163 10*3/uL (ref 150–400)
RBC: 4.34 MIL/uL (ref 4.22–5.81)
RDW: 13.1 % (ref 11.5–15.5)
WBC Count: 2.8 10*3/uL — ABNORMAL LOW (ref 4.0–10.5)
nRBC: 0 % (ref 0.0–0.2)

## 2019-12-23 LAB — IRON AND TIBC
Iron: 95 ug/dL (ref 42–163)
Saturation Ratios: 27 % (ref 20–55)
TIBC: 358 ug/dL (ref 202–409)
UIBC: 262 ug/dL (ref 117–376)

## 2019-12-23 LAB — FERRITIN: Ferritin: 147 ng/mL (ref 24–336)

## 2019-12-23 MED ORDER — ZOLEDRONIC ACID 4 MG/100ML IV SOLN
4.0000 mg | Freq: Once | INTRAVENOUS | Status: AC
  Administered 2019-12-23: 4 mg via INTRAVENOUS
  Filled 2019-12-23: qty 100

## 2019-12-23 NOTE — Progress Notes (Signed)
Hematology and Oncology Follow Up Visit  Jorge [name] [id number] [birth year] 64 y.o. 12/23/2019   Principle Diagnosis:  IgG Kappa myeloma - Hyperdiploid/+11 DVT of the LEFT and RIGHT leg  Iron deficiency anemia  Current Therapy:   Revlimid 41m po q day (21 on/7 off) - start on 04/07/2018   Zometa 4 mg IV q 3 months- next dose is 12/2019 Xarelto 10 mg PO daily IV Iron as needed   Interim History:  Mr. PBixleris here today for follow-up.  As expected, he is doing fantastic.  He really has no specific complaints.  He says that does and when he does wake up in the morning, he does have little bit of chest congestion.  He does not cough up any purulent sputum.  He now is working with his [employer]  It is called [employer].  This is focused on folks who have immunodeficiencies either primary or secondary.  I am very impressed that he does this.  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  I am sure that we will definitely be able to help with this cost.  As far as his myeloma is concerned, this is doing great.  He has had no problems from the myeloma.  His last monoclonal spike was not found in the blood.  He is on low-dose Xarelto.  He is doing well with this.  He is not having any leg pain or leg swelling.  His appetite is good.  There is no nausea or vomiting.  He has had no change in bowel or bladder habits.  Overall, I would say his performance status is ECOG 0.  I forgot to mention that he and his family will be going to the beach next weekend.    Medications:  Allergies as of 12/23/2019   No Known Allergies     Medication List       Accurate as of December 23, 2019 10:19 AM. If you have any questions, ask your nurse or doctor.        acetaminophen 325 MG tablet Commonly known as: TYLENOL Take 2 tablets (650 mg total) by mouth every 6 (six) hours as needed for mild pain (or Fever >/= 101). What changed: how much to take   albuterol 108 (90  Base) MCG/ACT inhaler Commonly known as: VENTOLIN HFA Inhale 2 puffs into the lungs every 6 (six) hours as needed for wheezing or shortness of breath.   amLODipine 10 MG tablet Commonly known as: NORVASC Take 10 mg by mouth daily.   amoxicillin 500 MG tablet Commonly known as: AMOXIL Take one hour before dental procedure   cetirizine 10 MG tablet Commonly known as: ZYRTEC Take 10 mg by mouth daily as needed for allergies.   famciclovir 500 MG tablet Commonly known as: FAMVIR TAKE 1 TABLET BY MOUTH  DAILY   fluorouracil 5 % cream Commonly known as: EFUDEX APPLY TO AFFECTED AREA TWICE A DAY   fluticasone 50 MCG/ACT nasal spray Commonly known as: FLONASE SPRAY 2 SPRAYS INTO EACH NOSTRIL EVERY DAY   IMODIUM PO Take by mouth as needed.   LOPERAMIDE HCL PO Take by mouth daily.   lenalidomide 5 MG capsule Commonly known as: Revlimid TAKE 1 CAPSULE BY MOUTH  DAILY FOR 21 DAYS ON, THEN  7 DAYS OFF   lidocaine 5 % Commonly known as: LIDODERM Place 1 patch onto the skin daily as needed (pain).   LIPO-FLAVONOID PLUS PO Take 1 tablet by  mouth daily.   lisinopril 40 MG tablet Commonly known as: ZESTRIL Take 40 mg by mouth daily.   OVER THE COUNTER MEDICATION Take 1 capsule by mouth daily. methylcare otc supplement   oxyCODONE 5 MG immediate release tablet Commonly known as: Oxy IR/ROXICODONE Take 1-2 tablets (5-10 mg total) by mouth every 6 (six) hours as needed for moderate pain.   pantoprazole 20 MG tablet Commonly known as: PROTONIX TAKE 1 TABLET BY MOUTH  DAILY   Probiotic Caps Take 1 capsule by mouth daily.   sertraline 25 MG tablet Commonly known as: ZOLOFT TAKE 3 TABLETS (75 MG TOTAL) BY MOUTH EVERY EVENING.   Vitamin D3 250 MCG (10000 UT) capsule Take 10,000 Units by mouth daily.   Xarelto 10 MG Tabs tablet Generic drug: rivaroxaban TAKE 1 TABLET BY MOUTH  DAILY   zolpidem 5 MG tablet Commonly known as: AMBIEN Take 1 tablet (5 mg total) by mouth  at bedtime. What changed:   when to take this  reasons to take this   ZOMETA IV Inject 4 mg into the vein every 3 (three) months. Receives at [name] office.       Allergies: No Known Allergies  Past Medical History, Surgical history, Social history, and Family History were reviewed and updated.  Review of Systems: Review of Systems  Constitutional: Negative.   HENT: Negative.   Eyes: Negative.   Respiratory: Negative.   Cardiovascular: Negative.   Gastrointestinal: Negative.   Genitourinary: Negative.   Musculoskeletal: Positive for myalgias.  Skin: Negative.   Neurological: Negative.   Endo/Heme/Allergies: Negative.   Psychiatric/Behavioral: Negative.      Physical Exam:  height is '5\' 7"'  (1.702 m) and weight is 189 lb 1.9 oz (85.8 kg). His temporal temperature is 96.4 F (35.8 C) (abnormal). His blood pressure is 125/86 and his pulse is 70. His respiration is 18 and oxygen saturation is 98%.   Wt Readings from Last 3 Encounters:  12/23/19 189 lb 1.9 oz (85.8 kg)  09/22/19 187 lb (84.8 kg)  07/25/19 187 lb (84.8 kg)    Physical Exam Vitals signs reviewed.  HENT:     Head: Normocephalic and atraumatic.  Eyes:     Pupils: Pupils are equal, round, and reactive to light.  Neck:     Musculoskeletal: Normal range of motion.  Cardiovascular:     Rate and Rhythm: Normal rate and regular rhythm.     Heart sounds: Normal heart sounds.  Pulmonary:     Effort: Pulmonary effort is normal.     Breath sounds: Normal breath sounds.  Abdominal:     General: Bowel sounds are normal.     Palpations: Abdomen is soft.  Musculoskeletal: Normal range of motion.        General: No tenderness or deformity.  Lymphadenopathy:     Cervical: No cervical adenopathy.  Skin:    General: Skin is warm and dry.     Findings: No erythema or rash.  Neurological:     Mental Status: He is alert and oriented to person, place, and time.  Psychiatric:        Behavior: Behavior  normal.        Thought Content: Thought content normal.        Judgment: Judgment normal.      Lab Results  Component Value Date   WBC 2.8 (L) 12/23/2019   HGB 15.3 12/23/2019   HCT 43.9 12/23/2019   MCV 101.2 (H) 12/23/2019   PLT 163 12/23/2019  Lab Results  Component Value Date   FERRITIN 163 06/14/2019   IRON 151 06/14/2019   TIBC 379 06/14/2019   UIBC 227 06/14/2019   IRONPCTSAT 40 06/14/2019   Lab Results  Component Value Date   RETICCTPCT 2.0 12/23/2019   RBC 4.34 12/23/2019   RBC 4.37 12/23/2019   Lab Results  Component Value Date   KPAFRELGTCHN 29.0 (H) 09/22/2019   LAMBDASER 14.7 09/22/2019   KAPLAMBRATIO 1.97 (H) 09/22/2019   Lab Results  Component Value Date   IGGSERUM 1,016 09/22/2019   IGA 287 09/22/2019   IGMSERUM 30 09/22/2019   Lab Results  Component Value Date   TOTALPROTELP 6.8 09/22/2019   ALBUMINELP 4.1 09/22/2019   A1GS 0.2 09/22/2019   A2GS 0.5 09/22/2019   BETS 1.0 09/22/2019   GAMS 1.0 09/22/2019   MSPIKE Not Observed 09/22/2019   SPEI Comment 01/05/2018     Chemistry      Component Value Date/Time   NA 139 12/23/2019 0905   NA 143 06/30/2017 1049   NA 140 10/03/2016 0845   K 4.3 12/23/2019 0905   K 4.0 06/30/2017 1049   K 4.3 10/03/2016 0845   CL 106 12/23/2019 0905   CL 105 06/30/2017 1049   CO2 25 12/23/2019 0905   CO2 27 06/30/2017 1049   CO2 26 10/03/2016 0845   BUN 12 12/23/2019 0905   BUN 13 06/30/2017 1049   BUN 11.9 10/03/2016 0845   CREATININE 0.86 12/23/2019 0905   CREATININE 1.0 06/30/2017 1049   CREATININE 0.9 10/03/2016 0845      Component Value Date/Time   CALCIUM 9.7 12/23/2019 0905   CALCIUM 9.0 06/30/2017 1049   CALCIUM 9.9 10/03/2016 0845   ALKPHOS 53 12/23/2019 0905   ALKPHOS 57 06/30/2017 1049   ALKPHOS 80 10/03/2016 0845   AST 43 (H) 12/23/2019 0905   AST 28 10/03/2016 0845   ALT 50 (H) 12/23/2019 0905   ALT 43 06/30/2017 1049   ALT 26 10/03/2016 0845   BILITOT 0.6 12/23/2019 0905    BILITOT 0.48 10/03/2016 0845      Impression and Plan: Jorge [name] is a pleasant 64 yo gentleman with IgG kappa myeloma.  He ultimately underwent a autologous stem cell transplant at [facility] in February 2018.   I am just happy that everything is going well for him right now.  He really has done quite nicely.  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  We will give him the Zometa today.  We will plan for 59-monthfollow-up.  He will get his Zometa at that time.  [name] 6/11/202110:19 AM

## 2019-12-23 NOTE — Patient Instructions (Signed)
Zoledronic Acid injection (Hypercalcemia, Oncology) What is this medicine? ZOLEDRONIC ACID (ZOE le dron ik AS id) lowers the amount of calcium loss from bone. It is used to treat too much calcium in your blood from cancer. It is also used to prevent complications of cancer that has spread to the bone. This medicine may be used for other purposes; ask your health care provider or pharmacist if you have questions. COMMON BRAND NAME(S): Zometa What should I tell my health care provider before I take this medicine? They need to know if you have any of these conditions:  aspirin-sensitive asthma  cancer, especially if you are receiving medicines used to treat cancer  dental disease or wear dentures  infection  kidney disease  receiving corticosteroids like dexamethasone or prednisone  an unusual or allergic reaction to zoledronic acid, other medicines, foods, dyes, or preservatives  pregnant or trying to get pregnant  breast-feeding How should I use this medicine? This medicine is for infusion into a vein. It is given by a health care professional in a hospital or clinic setting. Talk to your pediatrician regarding the use of this medicine in children. Special care may be needed. Overdosage: If you think you have taken too much of this medicine contact a poison control center or emergency room at once. NOTE: This medicine is only for you. Do not share this medicine with others. What if I miss a dose? It is important not to miss your dose. Call your doctor or health care professional if you are unable to keep an appointment. What may interact with this medicine?  certain antibiotics given by injection  NSAIDs, medicines for pain and inflammation, like ibuprofen or naproxen  some diuretics like bumetanide, furosemide  teriparatide  thalidomide This list may not describe all possible interactions. Give your health care provider a list of all the medicines, herbs, non-prescription  drugs, or dietary supplements you use. Also tell them if you smoke, drink alcohol, or use illegal drugs. Some items may interact with your medicine. What should I watch for while using this medicine? Visit your doctor or health care professional for regular checkups. It may be some time before you see the benefit from this medicine. Do not stop taking your medicine unless your doctor tells you to. Your doctor may order blood tests or other tests to see how you are doing. Women should inform their doctor if they wish to become pregnant or think they might be pregnant. There is a potential for serious side effects to an unborn child. Talk to your health care professional or pharmacist for more information. You should make sure that you get enough calcium and vitamin D while you are taking this medicine. Discuss the foods you eat and the vitamins you take with your health care professional. Some people who take this medicine have severe bone, joint, and/or muscle pain. This medicine may also increase your risk for jaw problems or a broken thigh bone. Tell your doctor right away if you have severe pain in your jaw, bones, joints, or muscles. Tell your doctor if you have any pain that does not go away or that gets worse. Tell your dentist and dental surgeon that you are taking this medicine. You should not have major dental surgery while on this medicine. See your dentist to have a dental exam and fix any dental problems before starting this medicine. Take good care of your teeth while on this medicine. Make sure you see your dentist for regular follow-up   appointments. What side effects may I notice from receiving this medicine? Side effects that you should report to your doctor or health care professional as soon as possible:  allergic reactions like skin rash, itching or hives, swelling of the face, lips, or tongue  anxiety, confusion, or depression  breathing problems  changes in vision  eye  pain  feeling faint or lightheaded, falls  jaw pain, especially after dental work  mouth sores  muscle cramps, stiffness, or weakness  redness, blistering, peeling or loosening of the skin, including inside the mouth  trouble passing urine or change in the amount of urine Side effects that usually do not require medical attention (report to your doctor or health care professional if they continue or are bothersome):  bone, joint, or muscle pain  constipation  diarrhea  fever  hair loss  irritation at site where injected  loss of appetite  nausea, vomiting  stomach upset  trouble sleeping  trouble swallowing  weak or tired This list may not describe all possible side effects. Call your doctor for medical advice about side effects. You may report side effects to FDA at 1-800-FDA-1088. Where should I keep my medicine? This drug is given in a hospital or clinic and will not be stored at home. NOTE: This sheet is a summary. It may not cover all possible information. If you have questions about this medicine, talk to your doctor, pharmacist, or health care provider.  2020 Elsevier/Gold Standard (2013-11-26 14:19:39)  

## 2019-12-24 LAB — IGG, IGA, IGM
IgA: 323 mg/dL (ref 61–437)
IgG (Immunoglobin G), Serum: 1003 mg/dL (ref 603–1613)
IgM (Immunoglobulin M), Srm: 22 mg/dL (ref 20–172)

## 2019-12-26 LAB — PROTEIN ELECTROPHORESIS, SERUM, WITH REFLEX
A/G Ratio: 1.2 (ref 0.7–1.7)
Albumin ELP: 3.7 g/dL (ref 2.9–4.4)
Alpha-1-Globulin: 0.3 g/dL (ref 0.0–0.4)
Alpha-2-Globulin: 0.6 g/dL (ref 0.4–1.0)
Beta Globulin: 1.1 g/dL (ref 0.7–1.3)
Gamma Globulin: 1.1 g/dL (ref 0.4–1.8)
Globulin, Total: 3 g/dL (ref 2.2–3.9)
Total Protein ELP: 6.7 g/dL (ref 6.0–8.5)

## 2019-12-26 LAB — KAPPA/LAMBDA LIGHT CHAINS
Kappa free light chain: 29.1 mg/L — ABNORMAL HIGH (ref 3.3–19.4)
Kappa, lambda light chain ratio: 2.39 — ABNORMAL HIGH (ref 0.26–1.65)
Lambda free light chains: 12.2 mg/L (ref 5.7–26.3)

## 2019-12-30 ENCOUNTER — Other Ambulatory Visit: Payer: Self-pay | Admitting: *Deleted

## 2019-12-30 DIAGNOSIS — C9000 Multiple myeloma not having achieved remission: Secondary | ICD-10-CM

## 2019-12-30 DIAGNOSIS — M545 Low back pain, unspecified: Secondary | ICD-10-CM

## 2019-12-30 DIAGNOSIS — D472 Monoclonal gammopathy: Secondary | ICD-10-CM

## 2019-12-30 MED ORDER — XARELTO 10 MG PO TABS: 10.0000 mg | ORAL_TABLET | Freq: Every day | ORAL | 3 refills | Status: DC

## 2020-01-10 ENCOUNTER — Other Ambulatory Visit: Payer: Self-pay | Admitting: Hematology & Oncology

## 2020-01-10 ENCOUNTER — Other Ambulatory Visit: Payer: Self-pay | Admitting: *Deleted

## 2020-01-10 DIAGNOSIS — C9000 Multiple myeloma not having achieved remission: Secondary | ICD-10-CM

## 2020-01-10 MED ORDER — LENALIDOMIDE 5 MG PO CAPS: ORAL_CAPSULE | ORAL | 0 refills | Status: DC

## 2020-01-12 ENCOUNTER — Telehealth: Payer: Self-pay | Admitting: *Deleted

## 2020-01-12 NOTE — Telephone Encounter (Signed)
Message received from patient requesting information of Covid-19 antibody test.  Call placed back to patient and message left to inform pt that this office does not do Covid-19 antibody tests and to contact his PCP regarding this test.  Instructed pt to call office back with any other questions or concerns.

## 2020-01-16 ENCOUNTER — Encounter: Payer: Self-pay | Admitting: Hematology & Oncology

## 2020-01-16 ENCOUNTER — Encounter: Payer: Self-pay | Admitting: Medical

## 2020-02-06 ENCOUNTER — Other Ambulatory Visit: Payer: Self-pay | Admitting: Hematology & Oncology

## 2020-02-06 DIAGNOSIS — D472 Monoclonal gammopathy: Secondary | ICD-10-CM

## 2020-02-06 DIAGNOSIS — G8929 Other chronic pain: Secondary | ICD-10-CM

## 2020-02-06 DIAGNOSIS — M545 Low back pain, unspecified: Secondary | ICD-10-CM

## 2020-02-06 DIAGNOSIS — C9000 Multiple myeloma not having achieved remission: Secondary | ICD-10-CM

## 2020-02-07 ENCOUNTER — Other Ambulatory Visit: Payer: Self-pay | Admitting: *Deleted

## 2020-02-07 DIAGNOSIS — C9000 Multiple myeloma not having achieved remission: Secondary | ICD-10-CM

## 2020-02-07 MED ORDER — AMOXICILLIN 500 MG PO CAPS: 1000.0000 mg | ORAL_CAPSULE | ORAL | 0 refills | Status: DC

## 2020-02-07 MED ORDER — LENALIDOMIDE 5 MG PO CAPS: ORAL_CAPSULE | ORAL | 0 refills | Status: DC

## 2020-02-21 ENCOUNTER — Telehealth: Payer: Self-pay | Admitting: Hematology & Oncology

## 2020-02-21 NOTE — Telephone Encounter (Signed)
I returned call from patient and LMVM for patient that we are unable to accept any donations of masks at this time due to COVID 19 restrictions.  I left my name & number should he have any questions.

## 2020-03-06 ENCOUNTER — Other Ambulatory Visit: Payer: Self-pay

## 2020-03-06 ENCOUNTER — Other Ambulatory Visit: Payer: Self-pay | Admitting: *Deleted

## 2020-03-06 ENCOUNTER — Inpatient Hospital Stay: Payer: Medicare Other | Attending: Hematology & Oncology

## 2020-03-06 DIAGNOSIS — C9000 Multiple myeloma not having achieved remission: Secondary | ICD-10-CM

## 2020-03-06 MED ORDER — LENALIDOMIDE 5 MG PO CAPS: ORAL_CAPSULE | ORAL | 0 refills | Status: DC

## 2020-03-22 ENCOUNTER — Other Ambulatory Visit: Payer: Self-pay

## 2020-03-22 DIAGNOSIS — C9000 Multiple myeloma not having achieved remission: Secondary | ICD-10-CM

## 2020-03-23 ENCOUNTER — Other Ambulatory Visit: Payer: Self-pay

## 2020-03-23 ENCOUNTER — Inpatient Hospital Stay: Payer: Medicare Other | Attending: Hematology & Oncology | Admitting: Hematology & Oncology

## 2020-03-23 ENCOUNTER — Inpatient Hospital Stay: Payer: Medicare Other

## 2020-03-23 VITALS — BP 143/85 | HR 63 | Temp 98.3°F | Resp 17 | Wt 191.2 lb

## 2020-03-23 DIAGNOSIS — R197 Diarrhea, unspecified: Secondary | ICD-10-CM | POA: Diagnosis not present

## 2020-03-23 DIAGNOSIS — C9000 Multiple myeloma not having achieved remission: Secondary | ICD-10-CM | POA: Insufficient documentation

## 2020-03-23 DIAGNOSIS — Z79899 Other long term (current) drug therapy: Secondary | ICD-10-CM | POA: Insufficient documentation

## 2020-03-23 DIAGNOSIS — D500 Iron deficiency anemia secondary to blood loss (chronic): Secondary | ICD-10-CM

## 2020-03-23 DIAGNOSIS — Z9484 Stem cells transplant status: Secondary | ICD-10-CM | POA: Diagnosis not present

## 2020-03-23 DIAGNOSIS — D509 Iron deficiency anemia, unspecified: Secondary | ICD-10-CM | POA: Insufficient documentation

## 2020-03-23 DIAGNOSIS — Z86718 Personal history of other venous thrombosis and embolism: Secondary | ICD-10-CM | POA: Insufficient documentation

## 2020-03-23 DIAGNOSIS — C9001 Multiple myeloma in remission: Secondary | ICD-10-CM

## 2020-03-23 DIAGNOSIS — Z7901 Long term (current) use of anticoagulants: Secondary | ICD-10-CM | POA: Diagnosis not present

## 2020-03-23 LAB — LACTATE DEHYDROGENASE: LDH: 199 U/L — ABNORMAL HIGH (ref 98–192)

## 2020-03-23 LAB — CBC WITH DIFFERENTIAL (CANCER CENTER ONLY)
Abs Immature Granulocytes: 0.01 10*3/uL (ref 0.00–0.07)
Basophils Absolute: 0.1 10*3/uL (ref 0.0–0.1)
Basophils Relative: 2 %
Eosinophils Absolute: 0.2 10*3/uL (ref 0.0–0.5)
Eosinophils Relative: 5 %
HCT: 47.3 % (ref 39.0–52.0)
Hemoglobin: 16.5 g/dL (ref 13.0–17.0)
Immature Granulocytes: 0 %
Lymphocytes Relative: 49 %
Lymphs Abs: 1.6 10*3/uL (ref 0.7–4.0)
MCH: 35.7 pg — ABNORMAL HIGH (ref 26.0–34.0)
MCHC: 34.9 g/dL (ref 30.0–36.0)
MCV: 102.4 fL — ABNORMAL HIGH (ref 80.0–100.0)
Monocytes Absolute: 0.5 10*3/uL (ref 0.1–1.0)
Monocytes Relative: 15 %
Neutro Abs: 1 10*3/uL — ABNORMAL LOW (ref 1.7–7.7)
Neutrophils Relative %: 29 %
Platelet Count: 144 10*3/uL — ABNORMAL LOW (ref 150–400)
RBC: 4.62 MIL/uL (ref 4.22–5.81)
RDW: 13.3 % (ref 11.5–15.5)
WBC Count: 3.3 10*3/uL — ABNORMAL LOW (ref 4.0–10.5)
nRBC: 0 % (ref 0.0–0.2)

## 2020-03-23 LAB — CMP (CANCER CENTER ONLY)
ALT: 43 U/L (ref 0–44)
AST: 37 U/L (ref 15–41)
Albumin: 4.4 g/dL (ref 3.5–5.0)
Alkaline Phosphatase: 53 U/L (ref 38–126)
Anion gap: 9 (ref 5–15)
BUN: 14 mg/dL (ref 8–23)
CO2: 26 mmol/L (ref 22–32)
Calcium: 10.1 mg/dL (ref 8.9–10.3)
Chloride: 106 mmol/L (ref 98–111)
Creatinine: 0.91 mg/dL (ref 0.61–1.24)
GFR, Est AFR Am: >60 mL/min (ref >60)
GFR, Estimated: >60 mL/min (ref >60)
Glucose, Bld: 100 mg/dL — ABNORMAL HIGH (ref 70–99)
Potassium: 3.9 mmol/L (ref 3.5–5.1)
Sodium: 141 mmol/L (ref 135–145)
Total Bilirubin: 0.8 mg/dL (ref 0.3–1.2)
Total Protein: 7.2 g/dL (ref 6.5–8.1)

## 2020-03-23 LAB — IRON AND TIBC
Iron: 91 ug/dL (ref 42–163)
Saturation Ratios: 24 % (ref 20–55)
TIBC: 381 ug/dL (ref 202–409)
UIBC: 290 ug/dL (ref 117–376)

## 2020-03-23 LAB — FERRITIN: Ferritin: 104 ng/mL (ref 24–336)

## 2020-03-23 MED ORDER — SODIUM CHLORIDE 0.9 % IV SOLN
Freq: Once | INTRAVENOUS | Status: AC
  Filled 2020-03-23: qty 250

## 2020-03-23 MED ORDER — ZOLEDRONIC ACID 4 MG/100ML IV SOLN
4.0000 mg | Freq: Once | INTRAVENOUS | Status: AC
  Administered 2020-03-23: 4 mg via INTRAVENOUS
  Filled 2020-03-23: qty 100

## 2020-03-23 NOTE — Progress Notes (Signed)
Hematology and Oncology Follow Up Visit  Jorge [name] [id number] [birth year] 64 y.o. 03/23/2020   Principle Diagnosis:  IgG Kappa myeloma - Hyperdiploid/+11 DVT of the LEFT and RIGHT leg  Iron deficiency anemia  Current Therapy:   Revlimid 40m po q day (21 on/7 off) - start on 04/07/2018   Zometa 4 mg IV q 3 months- next dose is 06/2020 Xarelto 10 mg PO daily IV Iron as needed   Interim History:  Jorge [name] here today for follow-up.  As expected, he is doing fantastic.  He looks tremendous.  He is active.  He is doing a lot of walking.  He is working out now.  He has had no problems with cough or shortness of breath.  He has had no problems with rashes.  He still having problems with some diarrhea.  I am sure this is from the Revlimid.  I will call in some Lomotil for him.  He has had no issues with bleeding.  His last myeloma studies did not show a monoclonal spike in the blood.  He is [place]  I am sure that they will have a good time down there.  He has been doing well on the low-dose Xeloda.  He has had lower leg swelling, mostly from amlodipine.  Overall, his performance status is ECOG 1.   Medications:  Allergies as of 03/23/2020   No Known Allergies     Medication List       Accurate as of March 23, 2020  9:01 AM. If you have any questions, ask your nurse or doctor.        STOP taking these medications   sertraline 25 MG tablet Commonly known as: ZOLOFT Stopped by: [name]     TAKE these medications   acetaminophen 325 MG tablet Commonly known as: TYLENOL Take 2 tablets (650 mg total) by mouth every 6 (six) hours as needed for mild pain (or Fever >/= 101). What changed: how much to take   albuterol 108 (90 Base) MCG/ACT inhaler Commonly known as: VENTOLIN HFA Inhale 2 puffs into the lungs every 6 (six) hours as needed for wheezing or shortness of breath.   amLODipine 5 MG tablet Commonly  known as: NORVASC Take by mouth. What changed: Another medication with the same name was removed. Continue taking this medication, and follow the directions you see here. Changed by: [name]   amoxicillin 500 MG tablet Commonly known as: AMOXIL Take one hour before dental procedure What changed: Another medication with the same name was removed. Continue taking this medication, and follow the directions you see here. Changed by: [name]   cetirizine 10 MG tablet Commonly known as: ZYRTEC Take 10 mg by mouth daily as needed for allergies.   famciclovir 500 MG tablet Commonly known as: FAMVIR TAKE 1 TABLET BY MOUTH EVERY DAY   fluorouracil 5 % cream Commonly known as: EFUDEX APPLY TO AFFECTED AREA TWICE A DAY   fluticasone 50 MCG/ACT nasal spray Commonly known as: FLONASE SPRAY 2 SPRAYS INTO EACH NOSTRIL EVERY DAY   IMODIUM PO Take by mouth as needed. What changed: Another medication with the same name was removed. Continue taking this medication, and follow the directions you see here. Changed by: [name]   lenalidomide 5 MG capsule Commonly known as: Revlimid TAKE 1 CAPSULE BY MOUTH  DAILY FOR 21 DAYS ON, THEN  7  DAYS OFF [id number]   lidocaine 5 % Commonly known as: LIDODERM Place 1 patch onto the skin daily as needed (pain).   LIPO-FLAVONOID PLUS PO Take 1 tablet by mouth daily.   lisinopril 40 MG tablet Commonly known as: ZESTRIL Take 40 mg by mouth daily.   OVER THE COUNTER MEDICATION Take 1 capsule by mouth daily. methylcare otc supplement   oxyCODONE 5 MG immediate release tablet Commonly known as: Oxy IR/ROXICODONE Take 1-2 tablets (5-10 mg total) by mouth every 6 (six) hours as needed for moderate pain.   pantoprazole 20 MG tablet Commonly known as: PROTONIX TAKE 1 TABLET BY MOUTH  DAILY   Probiotic Caps Take 1 capsule by mouth daily.   Vitamin D3 250 MCG (10000 UT) capsule Take 10,000 Units by mouth daily.    Xarelto 10 MG Tabs tablet Generic drug: rivaroxaban Take 1 tablet (10 mg total) by mouth daily.   zolpidem 5 MG tablet Commonly known as: AMBIEN Take 1 tablet (5 mg total) by mouth at bedtime. What changed:   when to take this  reasons to take this   ZOMETA IV Inject 4 mg into the vein every 3 (three) months. Receives at [name] office.       Allergies: No Known Allergies  Past Medical History, Surgical history, Social history, and Family History were reviewed and updated.  Review of Systems: Review of Systems  Constitutional: Negative.   HENT: Negative.   Eyes: Negative.   Respiratory: Negative.   Cardiovascular: Negative.   Gastrointestinal: Negative.   Genitourinary: Negative.   Musculoskeletal: Positive for myalgias.  Skin: Negative.   Neurological: Negative.   Endo/Heme/Allergies: Negative.   Psychiatric/Behavioral: Negative.      Physical Exam:  weight is 191 lb 4 oz (86.8 kg). His oral temperature is 98.3 F (36.8 C). His blood pressure is 143/85 (abnormal) and his pulse is 63. His respiration is 17 and oxygen saturation is 98%.   Wt Readings from Last 3 Encounters:  03/23/20 191 lb 4 oz (86.8 kg)  12/23/19 189 lb 1.9 oz (85.8 kg)  09/22/19 187 lb (84.8 kg)    Physical Exam Vitals signs reviewed.  HENT:     Head: Normocephalic and atraumatic.  Eyes:     Pupils: Pupils are equal, round, and reactive to light.  Neck:     Musculoskeletal: Normal range of motion.  Cardiovascular:     Rate and Rhythm: Normal rate and regular rhythm.     Heart sounds: Normal heart sounds.  Pulmonary:     Effort: Pulmonary effort is normal.     Breath sounds: Normal breath sounds.  Abdominal:     General: Bowel sounds are normal.     Palpations: Abdomen is soft.  Musculoskeletal: Normal range of motion.        General: No tenderness or deformity.  Lymphadenopathy:     Cervical: No cervical adenopathy.  Skin:    General: Skin is warm and dry.      Findings: No erythema or rash.  Neurological:     Mental Status: He is alert and oriented to person, place, and time.  Psychiatric:        Behavior: Behavior normal.        Thought Content: Thought content normal.        Judgment: Judgment normal.      Lab Results  Component Value Date   WBC 3.3 (L) 03/23/2020   HGB 16.5 03/23/2020   HCT 47.3 03/23/2020   MCV 102.4 (  H) 03/23/2020   PLT 144 (L) 03/23/2020   Lab Results  Component Value Date   FERRITIN 147 12/23/2019   IRON 95 12/23/2019   TIBC 358 12/23/2019   UIBC 262 12/23/2019   IRONPCTSAT 27 12/23/2019   Lab Results  Component Value Date   RETICCTPCT 2.0 12/23/2019   RBC 4.62 03/23/2020   Lab Results  Component Value Date   KPAFRELGTCHN 29.1 (H) 12/23/2019   LAMBDASER 12.2 12/23/2019   KAPLAMBRATIO 2.39 (H) 12/23/2019   Lab Results  Component Value Date   IGGSERUM 1,003 12/23/2019   IGA 323 12/23/2019   IGMSERUM 22 12/23/2019   Lab Results  Component Value Date   TOTALPROTELP 6.7 12/23/2019   ALBUMINELP 3.7 12/23/2019   A1GS 0.3 12/23/2019   A2GS 0.6 12/23/2019   BETS 1.1 12/23/2019   GAMS 1.1 12/23/2019   MSPIKE Not Observed 12/23/2019   SPEI Comment 01/05/2018     Chemistry      Component Value Date/Time   NA 141 03/23/2020 0802   NA 143 06/30/2017 1049   NA 140 10/03/2016 0845   K 3.9 03/23/2020 0802   K 4.0 06/30/2017 1049   K 4.3 10/03/2016 0845   CL 106 03/23/2020 0802   CL 105 06/30/2017 1049   CO2 26 03/23/2020 0802   CO2 27 06/30/2017 1049   CO2 26 10/03/2016 0845   BUN 14 03/23/2020 0802   BUN 13 06/30/2017 1049   BUN 11.9 10/03/2016 0845   CREATININE 0.91 03/23/2020 0802   CREATININE 1.0 06/30/2017 1049   CREATININE 0.9 10/03/2016 0845      Component Value Date/Time   CALCIUM 10.1 03/23/2020 0802   CALCIUM 9.0 06/30/2017 1049   CALCIUM 9.9 10/03/2016 0845   ALKPHOS 53 03/23/2020 0802   ALKPHOS 57 06/30/2017 1049   ALKPHOS 80 10/03/2016 0845   AST 37 03/23/2020 0802    AST 28 10/03/2016 0845   ALT 43 03/23/2020 0802   ALT 43 06/30/2017 1049   ALT 26 10/03/2016 0845   BILITOT 0.8 03/23/2020 0802   BILITOT 0.48 10/03/2016 0845      Impression and Plan: Jorge [name] is a pleasant 64 yo gentleman with IgG kappa myeloma.  He ultimately underwent a autologous stem cell transplant at [facility] in February 2018.   I am just happy that everything is going well for him right now.  He really has done quite nicely.  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  We will give him the Zometa today.  We will plan for 15-monthfollow-up.  He will get his Zometa at that time.  [name] 9/10/20219:01 AM

## 2020-03-23 NOTE — Patient Instructions (Signed)
Zoledronic Acid injection (Hypercalcemia, Oncology) What is this medicine? ZOLEDRONIC ACID (ZOE le dron ik AS id) lowers the amount of calcium loss from bone. It is used to treat too much calcium in your blood from cancer. It is also used to prevent complications of cancer that has spread to the bone. This medicine may be used for other purposes; ask your health care provider or pharmacist if you have questions. COMMON BRAND NAME(S): Zometa What should I tell my health care provider before I take this medicine? They need to know if you have any of these conditions:  aspirin-sensitive asthma  cancer, especially if you are receiving medicines used to treat cancer  dental disease or wear dentures  infection  kidney disease  receiving corticosteroids like dexamethasone or prednisone  an unusual or allergic reaction to zoledronic acid, other medicines, foods, dyes, or preservatives  pregnant or trying to get pregnant  breast-feeding How should I use this medicine? This medicine is for infusion into a vein. It is given by a health care professional in a hospital or clinic setting. Talk to your pediatrician regarding the use of this medicine in children. Special care may be needed. Overdosage: If you think you have taken too much of this medicine contact a poison control center or emergency room at once. NOTE: This medicine is only for you. Do not share this medicine with others. What if I miss a dose? It is important not to miss your dose. Call your doctor or health care professional if you are unable to keep an appointment. What may interact with this medicine?  certain antibiotics given by injection  NSAIDs, medicines for pain and inflammation, like ibuprofen or naproxen  some diuretics like bumetanide, furosemide  teriparatide  thalidomide This list may not describe all possible interactions. Give your health care provider a list of all the medicines, herbs, non-prescription  drugs, or dietary supplements you use. Also tell them if you smoke, drink alcohol, or use illegal drugs. Some items may interact with your medicine. What should I watch for while using this medicine? Visit your doctor or health care professional for regular checkups. It may be some time before you see the benefit from this medicine. Do not stop taking your medicine unless your doctor tells you to. Your doctor may order blood tests or other tests to see how you are doing. Women should inform their doctor if they wish to become pregnant or think they might be pregnant. There is a potential for serious side effects to an unborn child. Talk to your health care professional or pharmacist for more information. You should make sure that you get enough calcium and vitamin D while you are taking this medicine. Discuss the foods you eat and the vitamins you take with your health care professional. Some people who take this medicine have severe bone, joint, and/or muscle pain. This medicine may also increase your risk for jaw problems or a broken thigh bone. Tell your doctor right away if you have severe pain in your jaw, bones, joints, or muscles. Tell your doctor if you have any pain that does not go away or that gets worse. Tell your dentist and dental surgeon that you are taking this medicine. You should not have major dental surgery while on this medicine. See your dentist to have a dental exam and fix any dental problems before starting this medicine. Take good care of your teeth while on this medicine. Make sure you see your dentist for regular follow-up   appointments. What side effects may I notice from receiving this medicine? Side effects that you should report to your doctor or health care professional as soon as possible:  allergic reactions like skin rash, itching or hives, swelling of the face, lips, or tongue  anxiety, confusion, or depression  breathing problems  changes in vision  eye  pain  feeling faint or lightheaded, falls  jaw pain, especially after dental work  mouth sores  muscle cramps, stiffness, or weakness  redness, blistering, peeling or loosening of the skin, including inside the mouth  trouble passing urine or change in the amount of urine Side effects that usually do not require medical attention (report to your doctor or health care professional if they continue or are bothersome):  bone, joint, or muscle pain  constipation  diarrhea  fever  hair loss  irritation at site where injected  loss of appetite  nausea, vomiting  stomach upset  trouble sleeping  trouble swallowing  weak or tired This list may not describe all possible side effects. Call your doctor for medical advice about side effects. You may report side effects to FDA at 1-800-FDA-1088. Where should I keep my medicine? This drug is given in a hospital or clinic and will not be stored at home. NOTE: This sheet is a summary. It may not cover all possible information. If you have questions about this medicine, talk to your doctor, pharmacist, or health care provider.  2020 Elsevier/Gold Standard (2013-11-26 14:19:39)  

## 2020-03-24 LAB — IGG, IGA, IGM
IgA: 337 mg/dL (ref 61–437)
IgG (Immunoglobin G), Serum: 1019 mg/dL (ref 603–1613)
IgM (Immunoglobulin M), Srm: 27 mg/dL (ref 20–172)

## 2020-03-26 LAB — PROTEIN ELECTROPHORESIS, SERUM, WITH REFLEX
A/G Ratio: 1.3 (ref 0.7–1.7)
Albumin ELP: 3.8 g/dL (ref 2.9–4.4)
Alpha-1-Globulin: 0.2 g/dL (ref 0.0–0.4)
Alpha-2-Globulin: 0.6 g/dL (ref 0.4–1.0)
Beta Globulin: 1 g/dL (ref 0.7–1.3)
Gamma Globulin: 1.2 g/dL (ref 0.4–1.8)
Globulin, Total: 3 g/dL (ref 2.2–3.9)
Total Protein ELP: 6.8 g/dL (ref 6.0–8.5)

## 2020-03-26 LAB — KAPPA/LAMBDA LIGHT CHAINS
Kappa free light chain: 29.6 mg/L — ABNORMAL HIGH (ref 3.3–19.4)
Kappa, lambda light chain ratio: 2.24 — ABNORMAL HIGH (ref 0.26–1.65)
Lambda free light chains: 13.2 mg/L (ref 5.7–26.3)

## 2020-03-29 ENCOUNTER — Other Ambulatory Visit: Payer: Self-pay | Admitting: *Deleted

## 2020-03-29 DIAGNOSIS — C9000 Multiple myeloma not having achieved remission: Secondary | ICD-10-CM

## 2020-03-29 MED ORDER — LENALIDOMIDE 5 MG PO CAPS: ORAL_CAPSULE | ORAL | 0 refills | Status: DC

## 2020-04-02 ENCOUNTER — Other Ambulatory Visit: Payer: Self-pay | Admitting: Medical

## 2020-04-02 ENCOUNTER — Other Ambulatory Visit: Payer: Self-pay | Admitting: *Deleted

## 2020-04-02 MED ORDER — DIPHENOXYLATE-ATROPINE 2.5-0.025 MG PO TABS: 1.0000 | ORAL_TABLET | Freq: Four times a day (QID) | ORAL | 0 refills | Status: DC | PRN

## 2020-05-01 ENCOUNTER — Other Ambulatory Visit: Payer: Self-pay | Admitting: *Deleted

## 2020-05-01 DIAGNOSIS — C9000 Multiple myeloma not having achieved remission: Secondary | ICD-10-CM

## 2020-05-01 MED ORDER — LENALIDOMIDE 5 MG PO CAPS: ORAL_CAPSULE | ORAL | 0 refills | Status: DC

## 2020-05-13 ENCOUNTER — Other Ambulatory Visit: Payer: Self-pay | Admitting: Psychiatry

## 2020-05-13 DIAGNOSIS — G8929 Other chronic pain: Secondary | ICD-10-CM

## 2020-05-13 DIAGNOSIS — G4701 Insomnia due to medical condition: Secondary | ICD-10-CM

## 2020-05-14 ENCOUNTER — Other Ambulatory Visit: Payer: Self-pay | Admitting: Family

## 2020-05-14 ENCOUNTER — Telehealth: Payer: Self-pay | Admitting: *Deleted

## 2020-05-14 ENCOUNTER — Other Ambulatory Visit: Payer: Self-pay | Admitting: *Deleted

## 2020-05-14 ENCOUNTER — Ambulatory Visit (HOSPITAL_BASED_OUTPATIENT_CLINIC_OR_DEPARTMENT_OTHER)
Admission: RE | Admit: 2020-05-14 | Discharge: 2020-05-14 | Disposition: A | Discharge status: Home / Self Care | Payer: Medicare Other | Source: Ambulatory Visit | Attending: Family | Admitting: Family

## 2020-05-14 ENCOUNTER — Other Ambulatory Visit: Payer: Self-pay

## 2020-05-14 DIAGNOSIS — R059 Cough, unspecified: Secondary | ICD-10-CM

## 2020-05-14 DIAGNOSIS — C9000 Multiple myeloma not having achieved remission: Secondary | ICD-10-CM | POA: Insufficient documentation

## 2020-05-14 MED ORDER — CEFDINIR 300 MG PO CAPS: 300.0000 mg | ORAL_CAPSULE | Freq: Two times a day (BID) | ORAL | 0 refills | Status: DC

## 2020-05-14 NOTE — Telephone Encounter (Signed)
Has upcoming apt 05/29/20

## 2020-05-14 NOTE — Telephone Encounter (Signed)
Received a call from patient stating that he has had weeklong congestion with productive cough.  Has a tendency for pneumonia. [name] notified. Orders received for CXR

## 2020-05-17 ENCOUNTER — Other Ambulatory Visit: Payer: Self-pay | Admitting: *Deleted

## 2020-05-17 ENCOUNTER — Telehealth: Payer: Self-pay | Admitting: *Deleted

## 2020-05-17 MED ORDER — ALBUTEROL SULFATE HFA 108 (90 BASE) MCG/ACT IN AERS: 2.0000 | INHALATION_SPRAY | Freq: Four times a day (QID) | RESPIRATORY_TRACT | 0 refills | Status: DC | PRN

## 2020-05-17 MED ORDER — AZITHROMYCIN 250 MG PO TABS: ORAL_TABLET | ORAL | 0 refills | Status: DC

## 2020-05-17 MED ORDER — METHYLPREDNISOLONE 4 MG PO TBPK: ORAL_TABLET | ORAL | 0 refills | Status: DC

## 2020-05-17 NOTE — Telephone Encounter (Signed)
Message received from patient's wife, Collie Siad stating that patient is not any better after starting Omnicef on Monday, 05/14/20.  Collie Siad states that pt continues with congested cough with clear phlegm, nasal drainage, no fevers and is requesting a refill for pt.'s Albuterol inhaler.  Dr. Marin Olp notified and pt.'s wife notified per order of Dr. Marin Olp to stop Omnicef, start Z-pack, medrol dose pack and albuterol inhaler as needed.  Pt.'s wife is appreciative of assistance and has no further questions at this time.

## 2020-05-21 ENCOUNTER — Telehealth: Payer: Self-pay | Admitting: Medical

## 2020-05-21 ENCOUNTER — Telehealth: Payer: Self-pay | Admitting: *Deleted

## 2020-05-21 NOTE — Telephone Encounter (Signed)
Can you get pt scheduled for in office visit or virtual visit. Prefer in office so can listen to his lungs. If current symptoms of any cough or congestion ask to get rapid covid test and do. If negative then see in office. If + then virtual visit.  Can refer to Dr. Halford Chessman but want to know what is going on know and after evaluating him could ask for specific time frame to be seen

## 2020-05-21 NOTE — Telephone Encounter (Signed)
Patient states he would like a referral to Pulmonary doctor.

## 2020-05-21 NOTE — Telephone Encounter (Signed)
Patient states he has been  Experiencing respiratory problems and has tried multiple  medications and z packs . Patient states he would like a referral to Dr Roxan [facility] at [address], [place], [place] [address]

## 2020-05-21 NOTE — Telephone Encounter (Signed)
Appt made for 1/19 °

## 2020-05-21 NOTE — Telephone Encounter (Signed)
Patient called and stated, "can Dr. Marin Olp make a pulmonary referral? I've had wheezing, coughing, sore throat and laryngitis and nothing is helping." I informed him that a referral needs to come from his PCP, not a specialist. He verbalized understanding.

## 2020-05-22 ENCOUNTER — Ambulatory Visit (INDEPENDENT_AMBULATORY_CARE_PROVIDER_SITE_OTHER): Payer: Medicare Other | Admitting: Medical

## 2020-05-22 ENCOUNTER — Other Ambulatory Visit: Payer: Self-pay

## 2020-05-22 VITALS — BP 105/80 | HR 68 | Temp 98.0°F | Resp 18 | Ht 67.0 in | Wt 186.4 lb

## 2020-05-22 DIAGNOSIS — R062 Wheezing: Secondary | ICD-10-CM | POA: Diagnosis not present

## 2020-05-22 DIAGNOSIS — R059 Cough, unspecified: Secondary | ICD-10-CM | POA: Diagnosis not present

## 2020-05-22 MED ORDER — BUDESONIDE-FORMOTEROL FUMARATE 160-4.5 MCG/ACT IN AERO: 2.0000 | INHALATION_SPRAY | Freq: Two times a day (BID) | RESPIRATORY_TRACT | 0 refills | Status: DC

## 2020-05-22 MED ORDER — BENZONATATE 100 MG PO CAPS: 100.0000 mg | ORAL_CAPSULE | Freq: Three times a day (TID) | ORAL | 0 refills | Status: DC | PRN

## 2020-05-22 NOTE — Patient Instructions (Signed)
History of recent severe wheezing and cough following a trip.  Glad to hear Covid test was negative.  Appears that you did have good response to tapered Medrol and Z-Pak treatment.  No pneumonia found on recent x-ray.  However some persisting intermittent wheezing and cough.  Appears airways are reactive.  Remote history of smoking.  Based on number of years smoking doubt COPD.  I do think you would benefit with pulmonology referral.  They may do pulmonary function test.  Presently will prescribe Symbicort inhaler to use 1 inhalation twice daily.  Prescribed benzonatate for cough.  Pending referral to pulmonologist if you get recurrent worsening wheezing then would prescribe another 6-day taper dose of Medrol and repeat repeat chest x-ray.  Follow-up in 2 to 3 weeks or as needed.

## 2020-05-22 NOTE — Progress Notes (Signed)
Subjective:    Patient ID: Jorge [name], male    DOB: [birth year], 64 y.o.   MRN: [id number]  HPI  Pt in for on and off recurrent chest congestion over past 2 years.   Often time starting out with nasal congestion and then getting chest congestion.  Pt states recently got sick after getting back from [place] and got back 25 th of October. Pt states since then has chest congestion, nasal congestion and wheeziness.  No heart burn.  Pt got cxr thru Dr. Marin Olp at [facility]. Pt took zpack, 6 day prednisone and flonase.   Pt symptoms persists but some improved.   Pt cough has decreased a lot. Able to talk without coughing.  Right before he started medrol dose pack wheezed a lot while sleeping. Pt wife states wheezing much less and intermittent now.  Pt did smoke about 6 years from late teens to early 20's. About pack per day.  Also notes first time had wheezing episode after radiation years.  covid test post travel was negative.   Review of Systems  Constitutional: Negative for chills and fatigue.  HENT: Negative for dental problem.   Respiratory: Negative for cough, chest tightness, shortness of breath and wheezing.   Cardiovascular: Negative for chest pain and palpitations.  Gastrointestinal: Negative for abdominal pain.  Genitourinary: Negative for dysuria.  Musculoskeletal: Negative for back pain.  Skin: Negative for pallor and rash.  Neurological: Negative for dizziness, seizures, syncope, weakness and headaches.  Hematological: Negative for adenopathy. Does not bruise/bleed easily.  Psychiatric/Behavioral: Negative for behavioral problems and decreased concentration.    Past Medical History:  Diagnosis Date  . Allergy   . Anxiety   . Blood transfusion without reported diagnosis 2018  . Bone metastasis ([place])   . Chest cold 05/19/2016   productive cough  -- started on antibiotic  . Chronic back pain    due to bone mets from myeloma  . Cough   . Depression   .  Diverticulitis   . GERD (gastroesophageal reflux disease)   . Hiatal hernia   . History of chicken pox   . History of concussion    age 67 -- no residual  . History of DVT of lower extremity 03/21/2016  treated and completed w/ xarelto   per doppler left extensive occlusion common femoral, femoral, and popliteal veins and right partial occlusion common femoral and profunda femoral veins/  last doppler 06-04-2016 no evidence acute or chronic dvt noted either leg   . History of radiation therapy 06/09/16-06/23/16   lower thoracic spine 25 Gy in 10 fractions  . Mouth ulcers    secondary to radiation  . Multiple myeloma ([place]) dx 02/22/2016 via bone marrow bx---  oncologist-  [name]   IgG Kappa-- Hyperdiploid/ +11 w/ bone mets--  current treatment chemotherapy (started 08/ 2017)and pallitive radiation to back started 06-09-2016  . Renal calculus, right   . Wears contact lenses      Social History   Socioeconomic History  . Marital status: Married    Spouse name: Not on file  . Number of children: 3  . Years of education: Not on file  . Highest education level: Not on file  Occupational History  . Not on file  Tobacco Use  . Smoking status: Former Smoker    Packs/day: 1.00    Years: 7.00    Pack years: 7.00    Types: Cigarettes    Quit date: 11/02/1980    Years since quitting: 39.5  .  Smokeless tobacco: Never Used  Vaping Use  . Vaping Use: Never used  Substance and Sexual Activity  . Alcohol use: Yes    Comment: occasional  . Drug use: No  . Sexual activity: Never  Other Topics Concern  . Not on file  Social History Narrative  . Not on file   Social Determinants of Health   Financial Resource Strain:   . Difficulty of Paying Living Expenses: Not on file  Food Insecurity:   . Worried About Charity fundraiser in the Last Year: Not on file  . Ran Out of Food in the Last Year: Not on file  Transportation Needs:   . Lack of Transportation (Medical): Not on file  .  Lack of Transportation (Non-Medical): Not on file  Physical Activity:   . Days of Exercise per Week: Not on file  . Minutes of Exercise per Session: Not on file  Stress:   . Feeling of Stress : Not on file  Social Connections:   . Frequency of Communication with Friends and Family: Not on file  . Frequency of Social Gatherings with Friends and Family: Not on file  . Attends Religious Services: Not on file  . Active Member of Clubs or Organizations: Not on file  . Attends Archivist Meetings: Not on file  . Marital Status: Not on file  Intimate Partner Violence:   . Fear of Current or Ex-Partner: Not on file  . Emotionally Abused: Not on file  . Physically Abused: Not on file  . Sexually Abused: Not on file    Past Surgical History:  Procedure Laterality Date  . COLONOSCOPY  [id number]  . COLONOSCOPY  01/07/2017   Dr Penelope Coop, [facility]  . CYSTOSCOPY W/ URETERAL STENT PLACEMENT Right 06/20/2016   Procedure: CYSTOSCOPY WITH STENT REPLACEMENT;  Surgeon: Kathie Rhodes, MD;  Location: [facility];  Service: Urology;  Laterality: Right;  . CYSTOSCOPY WITH RETROGRADE PYELOGRAM, URETEROSCOPY AND STENT PLACEMENT Right 05/30/2016   Procedure: CYSTOSCOPY WITH RETROGRADE PYELOGRAM, URETEROSCOPY AND STENT PLACEMENT,DILITATION URETERAL STRICTURE;  Surgeon: Kathie Rhodes, MD;  Location: [facility];  Service: Urology;  Laterality: Right;  . CYSTOSCOPY/RETROGRADE/URETEROSCOPY/STONE EXTRACTION WITH BASKET Right 06/20/2016   Procedure: CYSTOSCOPY/URETEROSCOPY/STONE EXTRACTION WITH BASKET;  Surgeon: Kathie Rhodes, MD;  Location: [facility];  Service: Urology;  Laterality: Right;  . HIATAL HERNIA REPAIR N/A 08/13/2018   Procedure: LAPAROSCOPIC REPAIR OF HIATAL HERNIA REPAIR WITH FUNDOPLICATION AND INSERTION OF MESH;  Surgeon: Excell Seltzer, MD;  Location: [facility];  Service: General;  Laterality: N/A;  . HOLMIUM LASER APPLICATION Right 33/08/9516   Procedure: HOLMIUM LASER  APPLICATION;  Surgeon: Kathie Rhodes, MD;  Location[facility];  Service: Urology;  Laterality: Right;  . IR GENERIC HISTORICAL  02/11/2016   IR RADIOLOGIST EVAL & MGMT 02/11/2016 [facility]  . IR GENERIC HISTORICAL  02/15/2016   IR BONE TUMOR(S)RF ABLATION 02/15/2016 Luanne Bras, MD [facility]  . IR GENERIC HISTORICAL  02/15/2016   IR BONE TUMOR(S)RF ABLATION 02/15/2016 Luanne Bras, MD [facility]  . IR GENERIC HISTORICAL  02/15/2016   IR BONE TUMOR(S)RF ABLATION 02/15/2016 Luanne Bras, MD [facility]  . IR GENERIC HISTORICAL  02/15/2016   IR KYPHO THORACIC WITH BONE BIOPSY 02/15/2016 Luanne Bras, MD [facility]  . IR GENERIC HISTORICAL  02/15/2016   IR KYPHO THORACIC WITH BONE BIOPSY 02/15/2016 Luanne Bras, MD [facility]  . IR GENERIC HISTORICAL  02/15/2016   IR VERTEBROPLASTY CERV/THOR BX INC UNI/BIL  INC/INJECT/IMAGING 02/15/2016 Luanne Bras, MD [facility]  . IR GENERIC HISTORICAL  03/13/2016   IR KYPHO EA ADDL LEVEL THORACIC OR LUMBAR 03/13/2016 Luanne Bras, MD [facility]  . IR GENERIC HISTORICAL  03/13/2016   IR KYPHO EA ADDL LEVEL THORACIC OR LUMBAR 03/13/2016 Luanne Bras, MD [facility]  . IR GENERIC HISTORICAL  03/13/2016   IR BONE TUMOR(S)RF ABLATION 03/13/2016 Luanne Bras, MD [facility]  . IR GENERIC HISTORICAL  03/13/2016   IR KYPHO LUMBAR INC FX REDUCE BONE BX UNI/BIL CANNULATION INC/IMAGING 03/13/2016 Luanne Bras, MD [facility]  . IR GENERIC HISTORICAL  03/13/2016   IR BONE TUMOR(S)RF ABLATION 03/13/2016 Luanne Bras, MD [facility]  . IR GENERIC HISTORICAL  03/13/2016   IR BONE TUMOR(S)RF ABLATION 03/13/2016 Luanne Bras, MD [facility]  . IR GENERIC HISTORICAL  03/31/2016   IR RADIOLOGIST EVAL & MGMT 03/31/2016 [facility]  . KYPHOPLASTY     02/2016, 03/2016, 11/2016  . LAPAROSCOPIC INGUINAL HERNIA REPAIR Bilateral 12-16-2013  dr gross  . RADIOLOGY WITH ANESTHESIA N/A 02/15/2016   Procedure:  Spinal Ablation;  Surgeon: Luanne Bras, MD;  Location: [place];  Service: Radiology;  Laterality: N/A;  . RADIOLOGY WITH ANESTHESIA N/A 03/13/2016   Procedure: LUMBER ABLATION;  Surgeon: Luanne Bras, MD;  Location: [place];  Service: Radiology;  Laterality: N/A;  . ROTATOR CUFF REPAIR Right 2003  . spine operation     x1 in 2018 x3 in 2017  . TONSILLECTOMY  age 54  . VERTEBROPLASTY  01/2016  . WISDOM TOOTH EXTRACTION      Family History  Problem Relation Age of Onset  . Uterine cancer Mother   . Ovarian cancer Mother   . Colon cancer Mother        said it was rectal or colon but not sure  . Rectal cancer Mother   . Heart disease Father   . Hypertension Father   . Multiple sclerosis Sister   . Paranoid behavior Brother   . Drug abuse Brother   . Schizophrenia Brother   . Stroke Maternal Grandfather   . Cancer Maternal Aunt   . Leukemia Paternal Aunt   . Healthy Son        x1  . Healthy Daughter        x2  . Allergies Daughter        x1  . Diabetes Neg Hx   . Alzheimer's disease Neg Hx   . Parkinson's disease Neg Hx   . Esophageal cancer Neg Hx   . Stomach cancer Neg Hx     No Known Allergies  Current Outpatient Medications on File Prior to Visit  Medication Sig Dispense Refill  . acetaminophen (TYLENOL) 325 MG tablet Take 2 tablets (650 mg total) by mouth every 6 (six) hours as needed for mild pain (or Fever >/= 101). (Patient taking differently: Take 325 mg by mouth every 6 (six) hours as needed for mild pain (or Fever >/= 101). )    . albuterol (VENTOLIN HFA) 108 (90 Base) MCG/ACT inhaler Inhale 2 puffs into the lungs every 6 (six) hours as needed for wheezing or shortness of breath. 1 each 0  . amLODipine (NORVASC) 5 MG tablet Take by mouth.    . cefdinir (OMNICEF) 300 MG capsule Take 1 capsule (300 mg total) by mouth 2 (two) times daily. 14 capsule 0  . cetirizine (ZYRTEC) 10 MG tablet Take 10 mg by mouth daily as needed for allergies.    . Cholecalciferol  (  VITAMIN D3) 250 MCG (10000 UT) capsule Take 10,000 Units by mouth daily.    . diphenoxylate-atropine (LOMOTIL) 2.5-0.025 MG tablet Take 1 tablet by mouth 4 (four) times daily as needed for diarrhea or loose stools. 30 tablet 0  . famciclovir (FAMVIR) 500 MG tablet TAKE 1 TABLET BY MOUTH EVERY DAY 30 tablet 2  . fluorouracil (EFUDEX) 5 % cream APPLY TO AFFECTED AREA TWICE A DAY    . fluticasone (FLONASE) 50 MCG/ACT nasal spray SPRAY 2 SPRAYS INTO EACH NOSTRIL EVERY DAY 16 mL 1  . lenalidomide (REVLIMID) 5 MG capsule TAKE 1 CAPSULE BY MOUTH  DAILY FOR 21 DAYS ON, THEN  7 DAYS OFF QMVH#[id number] 21 capsule 0  . lidocaine (LIDODERM) 5 % Place 1 patch onto the skin daily as needed (pain).   2  . lisinopril (ZESTRIL) 40 MG tablet Take 40 mg by mouth daily.    . Loperamide HCl (IMODIUM PO) Take by mouth as needed.    [name] OVER THE COUNTER MEDICATION Take 1 capsule by mouth daily. methylcare otc supplement    . pantoprazole (PROTONIX) 20 MG tablet TAKE 1 TABLET BY MOUTH  DAILY 90 tablet 3  . Probiotic CAPS Take 1 capsule by mouth daily.    . Vitamins-Lipotropics (LIPO-FLAVONOID PLUS PO) Take 1 tablet by mouth daily.    Alveda Reasons 10 MG TABS tablet Take 1 tablet (10 mg total) by mouth daily. 90 tablet 3  . Zoledronic Acid (ZOMETA IV) Inject 4 mg into the vein every 3 (three) months. Receives at [name] office.    [name] zolpidem (AMBIEN) 5 MG tablet TAKE 1 TABLET BY MOUTH AT BEDTIME 30 tablet 0  . amoxicillin (AMOXIL) 500 MG tablet Take one hour before dental procedure (Patient not taking: Reported on 12/23/2019) 4 tablet 0  . azithromycin (ZITHROMAX) 250 MG tablet Take as directed per pharmacy (Patient not taking: Reported on 05/22/2020) 6 each 0  . methylPREDNISolone (MEDROL DOSEPAK) 4 MG TBPK tablet Take as directed per pharmacy (Patient not taking: Reported on 05/22/2020) 21 tablet 0  . oxyCODONE (OXY IR/ROXICODONE) 5 MG immediate release tablet Take 1-2 tablets (5-10 mg total) by mouth every 6 (six) hours as  needed for moderate pain. (Patient not taking: Reported on 05/22/2020) 20 tablet 0   Current Facility-Administered Medications on File Prior to Visit  Medication Dose Route Frequency Provider Last Rate Last Admin  . heparin lock flush 100 unit/mL  500 Units Intravenous Once [place], Holli Humbles, NP      . sodium chloride flush (NS) 0.9 % injection 3 mL  3 mL Intravenous Once PRN [place], Sarah M, NP        BP 105/80   Pulse 68   Temp 98 F (36.7 C) (Oral)   Resp 18   Ht '5\' 7"'  (1.702 m)   Wt 186 lb 6.4 oz (84.6 kg)   SpO2 95%   BMI 29.19 kg/m       Objective:   Physical Exam   General Mental Status- Alert. General Appearance- Not in acute distress.   Skin General: Color- Normal Color. Moisture- Normal Moisture.  Neck Carotid Arteries- Normal color. Moisture- Normal Moisture. No carotid bruits. No JVD.  Chest and Lung Exam Auscultation: Breath Sounds:-Normal.  Cardiovascular Auscultation:Rythm- Regular. Murmurs & Other Heart Sounds:Auscultation of the heart reveals- No Murmurs.  Abdomen Inspection:-Inspeection Normal. Palpation/Percussion:Note:No mass. Palpation and Percussion of the abdomen reveal- Non Tender, Non Distended + BS, no rebound or guarding.   Neurologic Cranial Nerve exam:- CN  III-XII intact(No nystagmus), symmetric smile. Strength:- 5/5 equal and symmetric strength both upper and lower extremities.     Assessment & Plan:  History of recent severe wheezing and cough following a trip.  Glad to hear Covid test was negative.  Appears that you did have good response to tapered Medrol and Z-Pak treatment.  No pneumonia found on recent x-ray.  However some persisting intermittent wheezing and cough.  Appears airways are reactive.  Remote history of smoking.  Based on number of years smoking doubt COPD.  I do think you would benefit with pulmonology referral.  They may do pulmonary function test.  Presently will prescribe Symbicort inhaler to use 1  inhalation twice daily.  Prescribed benzonatate for cough.  Pending referral to pulmonologist if you get recurrent worsening wheezing then would prescribe another 6-day taper dose of Medrol and repeat repeat chest x-ray.  Follow-up in 2 to 3 weeks or as needed.

## 2020-05-22 NOTE — Addendum Note (Signed)
Addended by: Anabel Halon on: 05/22/2020 02:28 PM   Modules accepted: Level of Service

## 2020-05-24 ENCOUNTER — Other Ambulatory Visit: Payer: Self-pay | Admitting: *Deleted

## 2020-05-24 DIAGNOSIS — C9000 Multiple myeloma not having achieved remission: Secondary | ICD-10-CM

## 2020-05-24 MED ORDER — LENALIDOMIDE 5 MG PO CAPS: ORAL_CAPSULE | ORAL | 0 refills | Status: DC

## 2020-05-29 ENCOUNTER — Ambulatory Visit (INDEPENDENT_AMBULATORY_CARE_PROVIDER_SITE_OTHER): Payer: Medicare Other | Admitting: Psychiatry

## 2020-05-29 ENCOUNTER — Other Ambulatory Visit: Payer: Self-pay

## 2020-05-29 ENCOUNTER — Encounter: Payer: Self-pay | Admitting: Psychiatry

## 2020-05-29 DIAGNOSIS — G4701 Insomnia due to medical condition: Secondary | ICD-10-CM

## 2020-05-29 DIAGNOSIS — F5105 Insomnia due to other mental disorder: Secondary | ICD-10-CM | POA: Diagnosis not present

## 2020-05-29 DIAGNOSIS — G8929 Other chronic pain: Secondary | ICD-10-CM | POA: Diagnosis not present

## 2020-05-29 DIAGNOSIS — F411 Generalized anxiety disorder: Secondary | ICD-10-CM | POA: Diagnosis not present

## 2020-05-29 MED ORDER — ZOLPIDEM TARTRATE 5 MG PO TABS: 5.0000 mg | ORAL_TABLET | Freq: Every day | ORAL | 5 refills | Status: DC

## 2020-05-29 NOTE — Progress Notes (Signed)
Jorge [name] [name] Jun 11, 1956 64 y.o.  Subjective:   Patient ID:  Jorge [name] is a 64 y.o. (DOB September 21, 1955) male.  Chief Complaint:  Chief Complaint  Patient presents with  . Follow-up  . Anxiety  . Sleeping Problem    HPI Jorge [name] presents to the office today for follow-up of anxiety.  seen by [name] March 2020.  No meds were changed. He continues on sertraline 100 mg daily and Ambien 10 mg nightly.  He is also on oxycodone. He is treated for multiple myeloma.  05/2019 appt with following noted: Primary stress is health.  Chronic pain but pushes through it.  Satisfied with sertraline.   Doing pretty well.  Staying busy and positive with good energy.  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  Job 3 nights weekly delivering pizza.   Patient reports stable mood and denies depressed or irritable moods.  Patient denies any recent difficulty with anxiety.  Patient denies difficulty with sleep initiation or maintenance. Denies appetite disturbance.  Patient reports that energy and motivation have been good.  Patient denies any difficulty with concentration.  Patient denies any suicidal ideation. Plan:  He'd like to try reducing OK reduce sertraline 75 mg daily. Disc dosing range. Reduce sertraline to 75 mg daily for at least 2 mos then if desired reduce to 50 mg daily.  Discussed the risk of relapse.  He has been stable for several years now. Reduce Ambien to 5 mg daily.  Disc SE.  05/29/2020 appointment with the following noted: Pretty good.   Reduced Ambien to every 2-3 days. Gradually eliminated Zoloft on 01/12/20 and hasn't felt a need to go back to it.  If has a down it's brief.  Anxiety is little.   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 Stable with cancer multiple myeloma.  Likely will recur.  Feels good and not fatigued.   Past Psychiatric Medication Trials: Sertraline 100 since 2016 Remotely took  Wellbutrin for a year.  Review of Systems:  Review of Systems  Constitutional: Negative for fatigue.  Neurological: Negative for tremors and weakness.    Medications: I have reviewed the patient's current medications.  Current Outpatient Medications  Medication Sig Dispense Refill  . albuterol (VENTOLIN HFA) 108 (90 Base) MCG/ACT inhaler Inhale 2 puffs into the lungs every 6 (six) hours as needed for wheezing or shortness of breath. 1 each 0  . budesonide-formoterol (SYMBICORT) 160-4.5 MCG/ACT inhaler Inhale 2 puffs into the lungs 2 (two) times daily. 1 each 0  . cetirizine (ZYRTEC) 10 MG tablet Take 10 mg by mouth daily as needed for allergies.    . fluticasone (FLONASE) 50 MCG/ACT nasal spray SPRAY 2 SPRAYS INTO EACH NOSTRIL EVERY DAY 16 mL 1  . lenalidomide (REVLIMID) 5 MG capsule TAKE 1 CAPSULE BY MOUTH  DAILY FOR 21 DAYS ON, THEN  7 DAYS OFF ONGE#[id number] 21 capsule 0  . lisinopril (ZESTRIL) 40 MG tablet Take 40 mg by mouth daily.    . pantoprazole (PROTONIX) 20 MG tablet TAKE 1 TABLET BY MOUTH  DAILY 90 tablet 3  . Probiotic CAPS Take 1 capsule by mouth daily.    . Vitamins-Lipotropics (LIPO-FLAVONOID PLUS PO) Take 1 tablet by mouth daily.    Alveda Reasons 10 MG TABS tablet Take 1 tablet (10 mg total) by mouth daily. 90 tablet 3  . zolpidem (AMBIEN) 5 MG tablet TAKE 1 TABLET BY MOUTH AT BEDTIME 30 tablet 0  .  acetaminophen (TYLENOL) 325 MG tablet Take 2 tablets (650 mg total) by mouth every 6 (six) hours as needed for mild pain (or Fever >/= 101). (Patient taking differently: Take 325 mg by mouth every 6 (six) hours as needed for mild pain (or Fever >/= 101). )    . amLODipine (NORVASC) 5 MG tablet Take by mouth.    . benzonatate (TESSALON) 100 MG capsule Take 1 capsule (100 mg total) by mouth 3 (three) times daily as needed for cough. 30 capsule 0  . Cholecalciferol (VITAMIN D3) 250 MCG (10000 UT) capsule Take 10,000 Units by mouth daily.    . diphenoxylate-atropine (LOMOTIL) 2.5-0.025  MG tablet Take 1 tablet by mouth 4 (four) times daily as needed for diarrhea or loose stools. (Patient not taking: Reported on 05/29/2020) 30 tablet 0  . fluorouracil (EFUDEX) 5 % cream APPLY TO AFFECTED AREA TWICE A DAY (Patient not taking: Reported on 05/29/2020)    . lidocaine (LIDODERM) 5 % Place 1 patch onto the skin daily as needed (pain).   2  . Loperamide HCl (IMODIUM PO) Take by mouth as needed. (Patient not taking: Reported on 05/29/2020)    . OVER THE COUNTER MEDICATION Take 1 capsule by mouth daily. methylcare otc supplement     No current facility-administered medications for this visit.   Facility-Administered Medications Ordered in Other Visits  Medication Dose Route Frequency Provider Last Rate Last Admin  . heparin lock flush 100 unit/mL  500 Units Intravenous Once [place], Sarah M, NP      . sodium chloride flush (NS) 0.9 % injection 3 mL  3 mL Intravenous Once PRN [place], Holli Humbles, NP        Medication Side Effects: None  Allergies: No Known Allergies  Past Medical History:  Diagnosis Date  . Allergy   . Anxiety   . Blood transfusion without reported diagnosis 2018  . Bone metastasis ([place])   . Chest cold 05/19/2016   productive cough  -- started on antibiotic  . Chronic back pain    due to bone mets from myeloma  . Cough   . Depression   . Diverticulitis   . GERD (gastroesophageal reflux disease)   . Hiatal hernia   . History of chicken pox   . History of concussion    age 50 -- no residual  . History of DVT of lower extremity 03/21/2016  treated and completed w/ xarelto   per doppler left extensive occlusion common femoral, femoral, and popliteal veins and right partial occlusion common femoral and profunda femoral veins/  last doppler 06-04-2016 no evidence acute or chronic dvt noted either leg   . History of radiation therapy 06/09/16-06/23/16   lower thoracic spine 25 Gy in 10 fractions  . Mouth ulcers    secondary to radiation  . Multiple myeloma  ([place]) dx 02/22/2016 via bone marrow bx---  oncologist-  [name]   IgG Kappa-- Hyperdiploid/ +11 w/ bone mets--  current treatment chemotherapy (started 08/ 2017)and pallitive radiation to back started 06-09-2016  . Renal calculus, right   . Wears contact lenses     Family History  Problem Relation Age of Onset  . Uterine cancer Mother   . Ovarian cancer Mother   . Colon cancer Mother        said it was rectal or colon but not sure  . Rectal cancer Mother   . Heart disease Father   . Hypertension Father   . Multiple sclerosis Sister   .  Paranoid behavior Brother   . Drug abuse Brother   . Schizophrenia Brother   . Stroke Maternal Grandfather   . Cancer Maternal Aunt   . Leukemia Paternal Aunt   . Healthy Son        x1  . Healthy Daughter        x2  . Allergies Daughter        x1  . Diabetes Neg Hx   . Alzheimer's disease Neg Hx   . Parkinson's disease Neg Hx   . Esophageal cancer Neg Hx   . Stomach cancer Neg Hx     Social History   Socioeconomic History  . Marital status: Married    Spouse name: Not on file  . Number of children: 3  . Years of education: Not on file  . Highest education level: Not on file  Occupational History  . Not on file  Tobacco Use  . Smoking status: Former Smoker    Packs/day: 1.00    Years: 7.00    Pack years: 7.00    Types: Cigarettes    Quit date: 11/02/1980    Years since quitting: 39.5  . Smokeless tobacco: Never Used  Vaping Use  . Vaping Use: Never used  Substance and Sexual Activity  . Alcohol use: Yes    Comment: occasional  . Drug use: No  . Sexual activity: Never  Other Topics Concern  . Not on file  Social History Narrative  . Not on file   Social Determinants of Health   Financial Resource Strain:   . Difficulty of Paying Living Expenses: Not on file  Food Insecurity:   . Worried About Charity fundraiser in the Last Year: Not on file  . Ran Out of Food in the Last Year: Not on file  Transportation Needs:    . Lack of Transportation (Medical): Not on file  . Lack of Transportation (Non-Medical): Not on file  Physical Activity:   . Days of Exercise per Week: Not on file  . Minutes of Exercise per Session: Not on file  Stress:   . Feeling of Stress : Not on file  Social Connections:   . Frequency of Communication with Friends and Family: Not on file  . Frequency of Social Gatherings with Friends and Family: Not on file  . Attends Religious Services: Not on file  . Active Member of Clubs or Organizations: Not on file  . Attends Archivist Meetings: Not on file  . Marital Status: Not on file  Intimate Partner Violence:   . Fear of Current or Ex-Partner: Not on file  . Emotionally Abused: Not on file  . Physically Abused: Not on file  . Sexually Abused: Not on file    Past Medical History, Surgical history, Social history, and Family history were reviewed and updated as appropriate.   Please see review of systems for further details on the patient's review from today.   Objective:   Physical Exam:  There were no vitals taken for this visit.  Physical Exam Constitutional:      General: He is not in acute distress.    Appearance: He is well-developed.  Musculoskeletal:        General: No deformity.  Neurological:     Mental Status: He is alert and oriented to person, place, and time.     Coordination: Coordination normal.  Psychiatric:        Attention and Perception: Attention and perception normal. He does not  perceive auditory or visual hallucinations.        Mood and Affect: Mood normal. Mood is not anxious or depressed. Affect is not labile, blunt, angry or inappropriate.        Speech: Speech normal. Speech is not rapid and pressured or slurred.        Behavior: Behavior normal.        Thought Content: Thought content normal. Thought content does not include homicidal or suicidal ideation. Thought content does not include homicidal or suicidal plan.         Cognition and Memory: Cognition and memory normal.        Judgment: Judgment normal.     Comments: Insight intact. No delusions.      Lab Review:     Component Value Date/Time   NA 141 03/23/2020 0802   NA 143 06/30/2017 1049   NA 140 10/03/2016 0845   K 3.9 03/23/2020 0802   K 4.0 06/30/2017 1049   K 4.3 10/03/2016 0845   CL 106 03/23/2020 0802   CL 105 06/30/2017 1049   CO2 26 03/23/2020 0802   CO2 27 06/30/2017 1049   CO2 26 10/03/2016 0845   GLUCOSE 100 (H) 03/23/2020 0802   GLUCOSE 89 06/30/2017 1049   BUN 14 03/23/2020 0802   BUN 13 06/30/2017 1049   BUN 11.9 10/03/2016 0845   CREATININE 0.91 03/23/2020 0802   CREATININE 1.0 06/30/2017 1049   CREATININE 0.9 10/03/2016 0845   CALCIUM 10.1 03/23/2020 0802   CALCIUM 9.0 06/30/2017 1049   CALCIUM 9.9 10/03/2016 0845   PROT 7.2 03/23/2020 0802   PROT 6.5 06/30/2017 1049   PROT 6.1 06/30/2017 1049   PROT 6.5 10/03/2016 0845   ALBUMIN 4.4 03/23/2020 0802   ALBUMIN 3.7 06/30/2017 1049   ALBUMIN 4.1 10/03/2016 0845   AST 37 03/23/2020 0802   AST 28 10/03/2016 0845   ALT 43 03/23/2020 0802   ALT 43 06/30/2017 1049   ALT 26 10/03/2016 0845   ALKPHOS 53 03/23/2020 0802   ALKPHOS 57 06/30/2017 1049   ALKPHOS 80 10/03/2016 0845   BILITOT 0.8 03/23/2020 0802   BILITOT 0.48 10/03/2016 0845   GFRNONAA >60 03/23/2020 0802   GFRAA >60 03/23/2020 0802       Component Value Date/Time   WBC 3.3 (L) 03/23/2020 0802   WBC 6.1 08/14/2018 0416   RBC 4.62 03/23/2020 0802   HGB 16.5 03/23/2020 0802   HGB 12.3 (L) 06/30/2017 1049   HCT 47.3 03/23/2020 0802   HCT 36.9 (L) 06/30/2017 1049   PLT 144 (L) 03/23/2020 0802   PLT 158 06/30/2017 1049   MCV 102.4 (H) 03/23/2020 0802   MCV 92 06/30/2017 1049   MCH 35.7 (H) 03/23/2020 0802   MCHC 34.9 03/23/2020 0802   RDW 13.3 03/23/2020 0802   RDW 14.9 06/30/2017 1049   LYMPHSABS 1.6 03/23/2020 0802   LYMPHSABS 1.0 06/30/2017 1049   MONOABS 0.5 03/23/2020 0802   EOSABS 0.2  03/23/2020 0802   EOSABS 0.1 06/30/2017 1049   BASOSABS 0.1 03/23/2020 0802   BASOSABS 0.0 06/30/2017 1049    No results found for: POCLITH, LITHIUM   No results found for: PHENYTOIN, PHENOBARB, VALPROATE, CBMZ   .res Assessment: Plan:    Hailey was seen today for follow-up, anxiety and sleeping problem.  Diagnoses and all orders for this visit:  Generalized anxiety disorder  Insomnia due to mental condition   Successful weaning off sertraline without new problems with anxiety for 4  mos.  Disc still some risk relapse and call if needed.  Reduce Ambien to 5 mg daily.  Disc SE.  FU  Prn  Lynder Parents, MD, DFAPA   Please see After Visit Summary for patient specific instructions.  Future Appointments  Date Time Provider [place]  06/22/2020  8:00 AM [facility] LAB [facility] None  06/22/2020  8:30 AM [place], Rudell Cobb, MD [facility] None  06/22/2020  9:00 AM [facility] A1 [facility] None    No orders of the defined types were placed in this encounter.   -------------------------------

## 2020-06-04 ENCOUNTER — Other Ambulatory Visit: Payer: Self-pay | Admitting: Hematology & Oncology

## 2020-06-13 ENCOUNTER — Other Ambulatory Visit: Payer: Self-pay | Admitting: Medical

## 2020-06-22 ENCOUNTER — Ambulatory Visit: Payer: Medicare Other | Admitting: Hematology & Oncology

## 2020-06-22 ENCOUNTER — Ambulatory Visit: Payer: Medicare Other

## 2020-06-22 ENCOUNTER — Other Ambulatory Visit: Payer: Medicare Other

## 2020-06-29 ENCOUNTER — Ambulatory Visit: Payer: Medicare Other | Admitting: Hematology & Oncology

## 2020-06-29 ENCOUNTER — Other Ambulatory Visit: Payer: Medicare Other

## 2020-07-03 ENCOUNTER — Other Ambulatory Visit: Payer: Self-pay | Admitting: *Deleted

## 2020-07-03 DIAGNOSIS — C9000 Multiple myeloma not having achieved remission: Secondary | ICD-10-CM

## 2020-07-03 MED ORDER — LENALIDOMIDE 5 MG PO CAPS: ORAL_CAPSULE | ORAL | 0 refills | Status: DC

## 2020-07-05 ENCOUNTER — Inpatient Hospital Stay: Payer: Medicare Other

## 2020-07-05 ENCOUNTER — Other Ambulatory Visit: Payer: Self-pay

## 2020-07-05 ENCOUNTER — Other Ambulatory Visit: Payer: Self-pay | Admitting: *Deleted

## 2020-07-05 ENCOUNTER — Inpatient Hospital Stay: Payer: Medicare Other | Attending: Hematology & Oncology

## 2020-07-05 ENCOUNTER — Inpatient Hospital Stay (HOSPITAL_BASED_OUTPATIENT_CLINIC_OR_DEPARTMENT_OTHER): Payer: Medicare Other | Admitting: Hematology & Oncology

## 2020-07-05 ENCOUNTER — Encounter: Payer: Self-pay | Admitting: Hematology & Oncology

## 2020-07-05 VITALS — BP 136/83 | HR 68 | Temp 98.7°F | Resp 18 | Wt 188.0 lb

## 2020-07-05 DIAGNOSIS — C9000 Multiple myeloma not having achieved remission: Secondary | ICD-10-CM

## 2020-07-05 DIAGNOSIS — D509 Iron deficiency anemia, unspecified: Secondary | ICD-10-CM | POA: Diagnosis not present

## 2020-07-05 DIAGNOSIS — Z9484 Stem cells transplant status: Secondary | ICD-10-CM | POA: Diagnosis not present

## 2020-07-05 DIAGNOSIS — R197 Diarrhea, unspecified: Secondary | ICD-10-CM | POA: Diagnosis not present

## 2020-07-05 DIAGNOSIS — Z79899 Other long term (current) drug therapy: Secondary | ICD-10-CM | POA: Diagnosis not present

## 2020-07-05 DIAGNOSIS — Z86718 Personal history of other venous thrombosis and embolism: Secondary | ICD-10-CM | POA: Insufficient documentation

## 2020-07-05 DIAGNOSIS — D500 Iron deficiency anemia secondary to blood loss (chronic): Secondary | ICD-10-CM

## 2020-07-05 DIAGNOSIS — Z7901 Long term (current) use of anticoagulants: Secondary | ICD-10-CM | POA: Diagnosis not present

## 2020-07-05 DIAGNOSIS — G8929 Other chronic pain: Secondary | ICD-10-CM

## 2020-07-05 DIAGNOSIS — D472 Monoclonal gammopathy: Secondary | ICD-10-CM

## 2020-07-05 LAB — IRON AND TIBC
Iron: 107 ug/dL (ref 42–163)
Saturation Ratios: 30 % (ref 20–55)
TIBC: 353 ug/dL (ref 202–409)
UIBC: 246 ug/dL (ref 117–376)

## 2020-07-05 LAB — CMP (CANCER CENTER ONLY)
ALT: 51 U/L — ABNORMAL HIGH (ref 0–44)
AST: 43 U/L — ABNORMAL HIGH (ref 15–41)
Albumin: 4.2 g/dL (ref 3.5–5.0)
Alkaline Phosphatase: 56 U/L (ref 38–126)
Anion gap: 6 (ref 5–15)
BUN: 10 mg/dL (ref 8–23)
CO2: 28 mmol/L (ref 22–32)
Calcium: 9.7 mg/dL (ref 8.9–10.3)
Chloride: 106 mmol/L (ref 98–111)
Creatinine: 0.97 mg/dL (ref 0.61–1.24)
GFR, Estimated: >60 mL/min (ref >60)
Glucose, Bld: 83 mg/dL (ref 70–99)
Potassium: 4.1 mmol/L (ref 3.5–5.1)
Sodium: 140 mmol/L (ref 135–145)
Total Bilirubin: 0.5 mg/dL (ref 0.3–1.2)
Total Protein: 7 g/dL (ref 6.5–8.1)

## 2020-07-05 LAB — CBC WITH DIFFERENTIAL (CANCER CENTER ONLY)
Abs Immature Granulocytes: 0.01 10*3/uL (ref 0.00–0.07)
Basophils Absolute: 0.1 10*3/uL (ref 0.0–0.1)
Basophils Relative: 2 %
Eosinophils Absolute: 0.2 10*3/uL (ref 0.0–0.5)
Eosinophils Relative: 5 %
HCT: 45.4 % (ref 39.0–52.0)
Hemoglobin: 15.8 g/dL (ref 13.0–17.0)
Immature Granulocytes: 0 %
Lymphocytes Relative: 44 %
Lymphs Abs: 1.4 10*3/uL (ref 0.7–4.0)
MCH: 35.7 pg — ABNORMAL HIGH (ref 26.0–34.0)
MCHC: 34.8 g/dL (ref 30.0–36.0)
MCV: 102.5 fL — ABNORMAL HIGH (ref 80.0–100.0)
Monocytes Absolute: 0.4 10*3/uL (ref 0.1–1.0)
Monocytes Relative: 14 %
Neutro Abs: 1.1 10*3/uL — ABNORMAL LOW (ref 1.7–7.7)
Neutrophils Relative %: 35 %
Platelet Count: 149 10*3/uL — ABNORMAL LOW (ref 150–400)
RBC: 4.43 MIL/uL (ref 4.22–5.81)
RDW: 13.8 % (ref 11.5–15.5)
WBC Count: 3.1 10*3/uL — ABNORMAL LOW (ref 4.0–10.5)
nRBC: 0 % (ref 0.0–0.2)

## 2020-07-05 LAB — FERRITIN: Ferritin: 161 ng/mL (ref 24–336)

## 2020-07-05 LAB — LACTATE DEHYDROGENASE: LDH: 181 U/L (ref 98–192)

## 2020-07-05 MED ORDER — XARELTO 10 MG PO TABS: 10.0000 mg | ORAL_TABLET | Freq: Every day | ORAL | 3 refills | Status: DC

## 2020-07-05 MED ORDER — SODIUM CHLORIDE 0.9 % IV SOLN
Freq: Once | INTRAVENOUS | Status: AC
  Filled 2020-07-05: qty 250

## 2020-07-05 MED ORDER — ZOLEDRONIC ACID 4 MG/100ML IV SOLN
4.0000 mg | Freq: Once | INTRAVENOUS | Status: AC
  Administered 2020-07-05: 10:00:00 4 mg via INTRAVENOUS
  Filled 2020-07-05: qty 100

## 2020-07-05 NOTE — Progress Notes (Signed)
Hematology and Oncology Follow Up Visit  Jorge [name] [id number] 17-Oct-1955 64 y.o. 07/05/2020   Principle Diagnosis:  IgG Kappa myeloma - Hyperdiploid/+11 DVT of the LEFT and RIGHT leg  Iron deficiency anemia  Current Therapy:   Revlimid 72m po q day (21 on/7 off) - start on 04/07/2018   Zometa 4 mg IV q 3 months- next dose is 09/2020 Xarelto 10 mg PO daily IV Iron as needed   Interim History:  [name] here today for follow-up.  Things really have gone well for him.  He is now working.  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  [place] time down there.  Unfortunately, he came back with what sounds like tracheobronchitis.  He probably got better with some inhaled steroids.  The myeloma has not been a problem for him.  When he last saw him 3 months ago, his M spike was not found.  His IgG level was 1019 mg/dL.  His kappa light chain was 3.0 mg/dL.  He is walking quite a bit.  He has had no problems with this.  His back seems to be doing pretty well.  He is doing well with the Xarelto.  He has had no bleeding.  Diarrhea seems to be doing a lot better.  He had this when he was on Famvir.  He has had no problems with the coronavirus.  Overall, his performance status is ECOG 1.    Medications:  Allergies as of 07/05/2020   No Known Allergies     Medication List       Accurate as of July 05, 2020  8:44 AM. If you have any questions, ask your nurse or doctor.        acetaminophen 325 MG tablet Commonly known as: TYLENOL Take 2 tablets (650 mg total) by mouth every 6 (six) hours as needed for mild pain (or Fever >/= 101). What changed: how much to take   albuterol 108 (90 Base) MCG/ACT inhaler Commonly known as: VENTOLIN HFA TAKE 2 PUFFS BY MOUTH EVERY 6 HOURS AS NEEDED FOR WHEEZE OR SHORTNESS OF BREATH   amLODipine 5 MG tablet Commonly known as: NORVASC Take by mouth.    amLODipine 10 MG tablet Commonly known as: NORVASC Take 10 mg by mouth daily.   benzonatate 100 MG capsule Commonly known as: TESSALON Take 1 capsule (100 mg total) by mouth 3 (three) times daily as needed for cough.   cetirizine 10 MG tablet Commonly known as: ZYRTEC Take 10 mg by mouth daily as needed for allergies.   diphenoxylate-atropine 2.5-0.025 MG tablet Commonly known as: LOMOTIL Take 1 tablet by mouth 4 (four) times daily as needed for diarrhea or loose stools.   famciclovir 500 MG tablet Commonly known as: FAMVIR Take 500 mg by mouth daily.   fluorouracil 5 % cream Commonly known as: EFUDEX APPLY TO AFFECTED AREA TWICE A DAY   fluticasone 50 MCG/ACT nasal spray Commonly known as: FLONASE SPRAY 2 SPRAYS INTO EACH NOSTRIL EVERY DAY   IMODIUM PO Take by mouth as needed.   lenalidomide 5 MG capsule Commonly known as: Revlimid TAKE 1 CAPSULE BY MOUTH  DAILY FOR 21 DAYS ON, THEN  7 DAYS OFF ANLGX#[id number]  lidocaine 5 % Commonly known as: LIDODERM Place 1 patch onto the skin daily as needed (pain).   LIPO-FLAVONOID PLUS PO Take 1 tablet by mouth daily.   lisinopril  40 MG tablet Commonly known as: ZESTRIL Take 40 mg by mouth daily.   OVER THE COUNTER MEDICATION Take 1 capsule by mouth daily. methylcare otc supplement   pantoprazole 20 MG tablet Commonly known as: PROTONIX TAKE 1 TABLET BY MOUTH  DAILY   Probiotic Caps Take 1 capsule by mouth daily.   Symbicort 160-4.5 MCG/ACT inhaler Generic drug: budesonide-formoterol TAKE 2 PUFFS BY MOUTH TWICE A DAY   Vitamin D3 250 MCG (10000 UT) capsule Take 10,000 Units by mouth daily.   Xarelto 10 MG Tabs tablet Generic drug: rivaroxaban Take 1 tablet (10 mg total) by mouth daily.   zolpidem 5 MG tablet Commonly known as: AMBIEN Take 1 tablet (5 mg total) by mouth at bedtime.       Allergies: No Known Allergies  Past Medical History, Surgical history, Social history, and Family History were  reviewed and updated.  Review of Systems: Review of Systems  Constitutional: Negative.   HENT: Negative.   Eyes: Negative.   Respiratory: Negative.   Cardiovascular: Negative.   Gastrointestinal: Negative.   Genitourinary: Negative.   Musculoskeletal: Positive for myalgias.  Skin: Negative.   Neurological: Negative.   Endo/Heme/Allergies: Negative.   Psychiatric/Behavioral: Negative.      Physical Exam:  weight is 188 lb (85.3 kg). His oral temperature is 98.7 F (37.1 C). His blood pressure is 136/83 and his pulse is 68. His respiration is 18 and oxygen saturation is 97%.   Wt Readings from Last 3 Encounters:  07/05/20 188 lb (85.3 kg)  05/22/20 186 lb 6.4 oz (84.6 kg)  03/23/20 191 lb 4 oz (86.8 kg)    Physical Exam Vitals signs reviewed.  HENT:     Head: Normocephalic and atraumatic.  Eyes:     Pupils: Pupils are equal, round, and reactive to light.  Neck:     Musculoskeletal: Normal range of motion.  Cardiovascular:     Rate and Rhythm: Normal rate and regular rhythm.     Heart sounds: Normal heart sounds.  Pulmonary:     Effort: Pulmonary effort is normal.     Breath sounds: Normal breath sounds.  Abdominal:     General: Bowel sounds are normal.     Palpations: Abdomen is soft.  Musculoskeletal: Normal range of motion.        General: No tenderness or deformity.  Lymphadenopathy:     Cervical: No cervical adenopathy.  Skin:    General: Skin is warm and dry.     Findings: No erythema or rash.  Neurological:     Mental Status: He is alert and oriented to person, place, and time.  Psychiatric:        Behavior: Behavior normal.        Thought Content: Thought content normal.        Judgment: Judgment normal.      Lab Results  Component Value Date   WBC 3.1 (L) 07/05/2020   HGB 15.8 07/05/2020   HCT 45.4 07/05/2020   MCV 102.5 (H) 07/05/2020   PLT 149 (L) 07/05/2020   Lab Results  Component Value Date   FERRITIN 104 03/23/2020   IRON 91  03/23/2020   TIBC 381 03/23/2020   UIBC 290 03/23/2020   IRONPCTSAT 24 03/23/2020   Lab Results  Component Value Date   RETICCTPCT 2.0 12/23/2019   RBC 4.43 07/05/2020   Lab Results  Component Value Date   KPAFRELGTCHN 29.6 (H) 03/23/2020   LAMBDASER 13.2 03/23/2020   KAPLAMBRATIO 2.24 (H) 03/23/2020  Lab Results  Component Value Date   IGGSERUM 1,019 03/23/2020   IGA 337 03/23/2020   IGMSERUM 27 03/23/2020   Lab Results  Component Value Date   TOTALPROTELP 6.8 03/23/2020   ALBUMINELP 3.8 03/23/2020   A1GS 0.2 03/23/2020   A2GS 0.6 03/23/2020   BETS 1.0 03/23/2020   GAMS 1.2 03/23/2020   MSPIKE Not Observed 03/23/2020   SPEI Comment 01/05/2018     Chemistry      Component Value Date/Time   NA 141 03/23/2020 0802   NA 143 06/30/2017 1049   NA 140 10/03/2016 0845   K 3.9 03/23/2020 0802   K 4.0 06/30/2017 1049   K 4.3 10/03/2016 0845   CL 106 03/23/2020 0802   CL 105 06/30/2017 1049   CO2 26 03/23/2020 0802   CO2 27 06/30/2017 1049   CO2 26 10/03/2016 0845   BUN 14 03/23/2020 0802   BUN 13 06/30/2017 1049   BUN 11.9 10/03/2016 0845   CREATININE 0.91 03/23/2020 0802   CREATININE 1.0 06/30/2017 1049   CREATININE 0.9 10/03/2016 0845      Component Value Date/Time   CALCIUM 10.1 03/23/2020 0802   CALCIUM 9.0 06/30/2017 1049   CALCIUM 9.9 10/03/2016 0845   ALKPHOS 53 03/23/2020 0802   ALKPHOS 57 06/30/2017 1049   ALKPHOS 80 10/03/2016 0845   AST 37 03/23/2020 0802   AST 28 10/03/2016 0845   ALT 43 03/23/2020 0802   ALT 43 06/30/2017 1049   ALT 26 10/03/2016 0845   BILITOT 0.8 03/23/2020 0802   BILITOT 0.48 10/03/2016 0845      Impression and Plan: Mr. Oblinger is a pleasant 64 yo gentleman with IgG kappa myeloma.  He ultimately underwent an autologous stem cell transplant at [facility] in February 2018.   I am just happy that everything is going well for him right now.  He really has done quite nicely.  He is staying active.  He really enjoys being able to  help others.  Of note, he has a book that is currently being added.  I am sure that this is going to be very influential.  We will plan to get him back in 3 months.  I think this is very reasonable.    [name], MD 12/23/20218:44 AM

## 2020-07-05 NOTE — Patient Instructions (Signed)
Zoledronic Acid injection (Hypercalcemia, Oncology) What is this medicine? ZOLEDRONIC ACID (ZOE le dron ik AS id) lowers the amount of calcium loss from bone. It is used to treat too much calcium in your blood from cancer. It is also used to prevent complications of cancer that has spread to the bone. This medicine may be used for other purposes; ask your health care provider or pharmacist if you have questions. COMMON BRAND NAME(S): Zometa What should I tell my health care provider before I take this medicine? They need to know if you have any of these conditions:  aspirin-sensitive asthma  cancer, especially if you are receiving medicines used to treat cancer  dental disease or wear dentures  infection  kidney disease  receiving corticosteroids like dexamethasone or prednisone  an unusual or allergic reaction to zoledronic acid, other medicines, foods, dyes, or preservatives  pregnant or trying to get pregnant  breast-feeding How should I use this medicine? This medicine is for infusion into a vein. It is given by a health care professional in a hospital or clinic setting. Talk to your pediatrician regarding the use of this medicine in children. Special care may be needed. Overdosage: If you think you have taken too much of this medicine contact a poison control center or emergency room at once. NOTE: This medicine is only for you. Do not share this medicine with others. What if I miss a dose? It is important not to miss your dose. Call your doctor or health care professional if you are unable to keep an appointment. What may interact with this medicine?  certain antibiotics given by injection  NSAIDs, medicines for pain and inflammation, like ibuprofen or naproxen  some diuretics like bumetanide, furosemide  teriparatide  thalidomide This list may not describe all possible interactions. Give your health care provider a list of all the medicines, herbs, non-prescription  drugs, or dietary supplements you use. Also tell them if you smoke, drink alcohol, or use illegal drugs. Some items may interact with your medicine. What should I watch for while using this medicine? Visit your doctor or health care professional for regular checkups. It may be some time before you see the benefit from this medicine. Do not stop taking your medicine unless your doctor tells you to. Your doctor may order blood tests or other tests to see how you are doing. Women should inform their doctor if they wish to become pregnant or think they might be pregnant. There is a potential for serious side effects to an unborn child. Talk to your health care professional or pharmacist for more information. You should make sure that you get enough calcium and vitamin D while you are taking this medicine. Discuss the foods you eat and the vitamins you take with your health care professional. Some people who take this medicine have severe bone, joint, and/or muscle pain. This medicine may also increase your risk for jaw problems or a broken thigh bone. Tell your doctor right away if you have severe pain in your jaw, bones, joints, or muscles. Tell your doctor if you have any pain that does not go away or that gets worse. Tell your dentist and dental surgeon that you are taking this medicine. You should not have major dental surgery while on this medicine. See your dentist to have a dental exam and fix any dental problems before starting this medicine. Take good care of your teeth while on this medicine. Make sure you see your dentist for regular follow-up   appointments. What side effects may I notice from receiving this medicine? Side effects that you should report to your doctor or health care professional as soon as possible:  allergic reactions like skin rash, itching or hives, swelling of the face, lips, or tongue  anxiety, confusion, or depression  breathing problems  changes in vision  eye  pain  feeling faint or lightheaded, falls  jaw pain, especially after dental work  mouth sores  muscle cramps, stiffness, or weakness  redness, blistering, peeling or loosening of the skin, including inside the mouth  trouble passing urine or change in the amount of urine Side effects that usually do not require medical attention (report to your doctor or health care professional if they continue or are bothersome):  bone, joint, or muscle pain  constipation  diarrhea  fever  hair loss  irritation at site where injected  loss of appetite  nausea, vomiting  stomach upset  trouble sleeping  trouble swallowing  weak or tired This list may not describe all possible side effects. Call your doctor for medical advice about side effects. You may report side effects to FDA at 1-800-FDA-1088. Where should I keep my medicine? This drug is given in a hospital or clinic and will not be stored at home. NOTE: This sheet is a summary. It may not cover all possible information. If you have questions about this medicine, talk to your doctor, pharmacist, or health care provider.  2020 Elsevier/Gold Standard (2013-11-26 14:19:39)  

## 2020-07-06 LAB — IGG, IGA, IGM
IgA: 333 mg/dL (ref 61–437)
IgG (Immunoglobin G), Serum: 1031 mg/dL (ref 603–1613)
IgM (Immunoglobulin M), Srm: 31 mg/dL (ref 20–172)

## 2020-07-09 ENCOUNTER — Encounter: Payer: Self-pay | Admitting: *Deleted

## 2020-07-09 LAB — KAPPA/LAMBDA LIGHT CHAINS
Kappa free light chain: 33.3 mg/L — ABNORMAL HIGH (ref 3.3–19.4)
Kappa, lambda light chain ratio: 2.09 — ABNORMAL HIGH (ref 0.26–1.65)
Lambda free light chains: 15.9 mg/L (ref 5.7–26.3)

## 2020-07-09 LAB — PROTEIN ELECTROPHORESIS, SERUM, WITH REFLEX
A/G Ratio: 1.6 (ref 0.7–1.7)
Albumin ELP: 4.1 g/dL (ref 2.9–4.4)
Alpha-1-Globulin: 0.2 g/dL (ref 0.0–0.4)
Alpha-2-Globulin: 0.5 g/dL (ref 0.4–1.0)
Beta Globulin: 1 g/dL (ref 0.7–1.3)
Gamma Globulin: 1 g/dL (ref 0.4–1.8)
Globulin, Total: 2.6 g/dL (ref 2.2–3.9)
Total Protein ELP: 6.7 g/dL (ref 6.0–8.5)

## 2020-07-15 ENCOUNTER — Other Ambulatory Visit: Payer: Self-pay | Admitting: Medical

## 2020-08-08 ENCOUNTER — Other Ambulatory Visit: Payer: Self-pay | Admitting: Hematology & Oncology

## 2020-08-08 DIAGNOSIS — C9000 Multiple myeloma not having achieved remission: Secondary | ICD-10-CM

## 2020-08-09 ENCOUNTER — Other Ambulatory Visit: Payer: Self-pay | Admitting: *Deleted

## 2020-08-09 DIAGNOSIS — C9000 Multiple myeloma not having achieved remission: Secondary | ICD-10-CM

## 2020-08-09 MED ORDER — LENALIDOMIDE 5 MG PO CAPS: ORAL_CAPSULE | ORAL | 0 refills | Status: DC

## 2020-08-13 ENCOUNTER — Telehealth: Payer: Self-pay | Admitting: *Deleted

## 2020-08-13 ENCOUNTER — Other Ambulatory Visit: Payer: Self-pay | Admitting: *Deleted

## 2020-08-13 MED ORDER — AMOXICILLIN 500 MG PO CAPS: 1000.0000 mg | ORAL_CAPSULE | ORAL | 0 refills | Status: DC

## 2020-08-13 NOTE — Telephone Encounter (Signed)
Call received from patient stating that he has a dentist appt today at noon and needs a prescription for amoxicillin [facility] to [facility] [address].  Prescription sent per pt.'s request and order of Dr. Marin Olp.

## 2020-08-29 ENCOUNTER — Telehealth: Payer: Self-pay

## 2020-08-29 NOTE — Telephone Encounter (Signed)
Returned pts call to make a lab only appt for 2/22.  Per pt he brings in a MM kit from [place], this is ok per Shearon Balo

## 2020-09-04 ENCOUNTER — Other Ambulatory Visit: Payer: Self-pay

## 2020-09-04 ENCOUNTER — Inpatient Hospital Stay: Payer: Medicare Other | Attending: Hematology & Oncology

## 2020-09-04 DIAGNOSIS — C9000 Multiple myeloma not having achieved remission: Secondary | ICD-10-CM | POA: Insufficient documentation

## 2020-09-04 LAB — CMP (CANCER CENTER ONLY)
ALT: 49 U/L — ABNORMAL HIGH (ref 0–44)
AST: 41 U/L (ref 15–41)
Albumin: 4.5 g/dL (ref 3.5–5.0)
Alkaline Phosphatase: 51 U/L (ref 38–126)
Anion gap: 6 (ref 5–15)
BUN: 11 mg/dL (ref 8–23)
CO2: 30 mmol/L (ref 22–32)
Calcium: 9.6 mg/dL (ref 8.9–10.3)
Chloride: 105 mmol/L (ref 98–111)
Creatinine: 1.03 mg/dL (ref 0.61–1.24)
GFR, Estimated: >60 mL/min (ref >60)
Glucose, Bld: 88 mg/dL (ref 70–99)
Potassium: 3.8 mmol/L (ref 3.5–5.1)
Sodium: 141 mmol/L (ref 135–145)
Total Bilirubin: 0.7 mg/dL (ref 0.3–1.2)
Total Protein: 7.1 g/dL (ref 6.5–8.1)

## 2020-09-04 LAB — CBC WITH DIFFERENTIAL (CANCER CENTER ONLY)
Abs Immature Granulocytes: 0.01 10*3/uL (ref 0.00–0.07)
Basophils Absolute: 0 10*3/uL (ref 0.0–0.1)
Basophils Relative: 1 %
Eosinophils Absolute: 0.2 10*3/uL (ref 0.0–0.5)
Eosinophils Relative: 5 %
HCT: 44.3 % (ref 39.0–52.0)
Hemoglobin: 15.4 g/dL (ref 13.0–17.0)
Immature Granulocytes: 0 %
Lymphocytes Relative: 38 %
Lymphs Abs: 1.4 10*3/uL (ref 0.7–4.0)
MCH: 35.4 pg — ABNORMAL HIGH (ref 26.0–34.0)
MCHC: 34.8 g/dL (ref 30.0–36.0)
MCV: 101.8 fL — ABNORMAL HIGH (ref 80.0–100.0)
Monocytes Absolute: 0.4 10*3/uL (ref 0.1–1.0)
Monocytes Relative: 12 %
Neutro Abs: 1.6 10*3/uL — ABNORMAL LOW (ref 1.7–7.7)
Neutrophils Relative %: 44 %
Platelet Count: 135 10*3/uL — ABNORMAL LOW (ref 150–400)
RBC: 4.35 MIL/uL (ref 4.22–5.81)
RDW: 12.7 % (ref 11.5–15.5)
WBC Count: 3.6 10*3/uL — ABNORMAL LOW (ref 4.0–10.5)
nRBC: 0 % (ref 0.0–0.2)

## 2020-09-05 LAB — IGG, IGA, IGM
IgA: 334 mg/dL (ref 61–437)
IgG (Immunoglobin G), Serum: 1003 mg/dL (ref 603–1613)
IgM (Immunoglobulin M), Srm: 27 mg/dL (ref 20–172)

## 2020-09-05 LAB — IRON AND TIBC
Iron: 111 ug/dL (ref 42–163)
Saturation Ratios: 31 % (ref 20–55)
TIBC: 358 ug/dL (ref 202–409)
UIBC: 247 ug/dL (ref 117–376)

## 2020-09-05 LAB — PROTEIN ELECTROPHORESIS, SERUM, WITH REFLEX
A/G Ratio: 1.4 (ref 0.7–1.7)
Albumin ELP: 4 g/dL (ref 2.9–4.4)
Alpha-1-Globulin: 0.2 g/dL (ref 0.0–0.4)
Alpha-2-Globulin: 0.5 g/dL (ref 0.4–1.0)
Beta Globulin: 1 g/dL (ref 0.7–1.3)
Gamma Globulin: 1.2 g/dL (ref 0.4–1.8)
Globulin, Total: 2.9 g/dL (ref 2.2–3.9)
Total Protein ELP: 6.9 g/dL (ref 6.0–8.5)

## 2020-09-05 LAB — KAPPA/LAMBDA LIGHT CHAINS
Kappa free light chain: 28.7 mg/L — ABNORMAL HIGH (ref 3.3–19.4)
Kappa, lambda light chain ratio: 1.98 — ABNORMAL HIGH (ref 0.26–1.65)
Lambda free light chains: 14.5 mg/L (ref 5.7–26.3)

## 2020-09-05 LAB — FERRITIN: Ferritin: 141 ng/mL (ref 24–336)

## 2020-09-06 ENCOUNTER — Other Ambulatory Visit: Payer: Self-pay | Admitting: *Deleted

## 2020-09-06 ENCOUNTER — Other Ambulatory Visit: Payer: Self-pay | Admitting: Hematology & Oncology

## 2020-09-06 DIAGNOSIS — C9000 Multiple myeloma not having achieved remission: Secondary | ICD-10-CM

## 2020-09-06 MED ORDER — LENALIDOMIDE 5 MG PO CAPS: ORAL_CAPSULE | ORAL | 0 refills | Status: DC

## 2020-09-10 ENCOUNTER — Telehealth (INDEPENDENT_AMBULATORY_CARE_PROVIDER_SITE_OTHER): Payer: Medicare Other | Admitting: Medical

## 2020-09-10 ENCOUNTER — Telehealth: Payer: Self-pay | Admitting: Hematology & Oncology

## 2020-09-10 ENCOUNTER — Other Ambulatory Visit: Payer: Self-pay

## 2020-09-10 VITALS — Temp 100.4°F

## 2020-09-10 DIAGNOSIS — H109 Unspecified conjunctivitis: Secondary | ICD-10-CM

## 2020-09-10 DIAGNOSIS — R062 Wheezing: Secondary | ICD-10-CM

## 2020-09-10 DIAGNOSIS — R059 Cough, unspecified: Secondary | ICD-10-CM | POA: Diagnosis not present

## 2020-09-10 DIAGNOSIS — G8929 Other chronic pain: Secondary | ICD-10-CM

## 2020-09-10 DIAGNOSIS — G4701 Insomnia due to medical condition: Secondary | ICD-10-CM

## 2020-09-10 MED ORDER — AZITHROMYCIN 250 MG PO TABS: ORAL_TABLET | ORAL | 0 refills | Status: DC

## 2020-09-10 MED ORDER — BENZONATATE 100 MG PO CAPS: 100.0000 mg | ORAL_CAPSULE | Freq: Three times a day (TID) | ORAL | 0 refills | Status: DC | PRN

## 2020-09-10 MED ORDER — TOBRAMYCIN 0.3 % OP SOLN
2.0000 [drp] | Freq: Four times a day (QID) | OPHTHALMIC | 0 refills | Status: DC
Start: 2020-09-10 — End: 2020-10-11

## 2020-09-10 NOTE — Telephone Encounter (Signed)
Returned call to patient regarding his concerns about possible Covid.  He took an at home test which was negative.  He has a Video Visit w/ PCP today @ 4:00.  I advised him to be sure to advise his PCP of this Covid concer as well.  He voiced complete understanding and will let PCP know.

## 2020-09-10 NOTE — Progress Notes (Addendum)
   Subjective:    Patient ID: Jorge [name], male    DOB: 1955-12-17, 65 y.o.   MRN: [id number]  HPI  Virtual Visit via Video Note  I connected with Jorge [name] on 09/10/20 at  4:00 PM EST by a video enabled telemedicine application and verified that I am speaking with the correct person using two identifiers.  Location: Patient: home Provider: office  Particpants- pt, myself and wife.   I discussed the limitations of evaluation and management by telemedicine and the availability of in person appointments. The patient expressed understanding and agreed to proceed.  History of Present Illness:  Pt was out of town about just recently.  Pt states was flying recently when out of town.  His left eye got red yesterday morning. Some redness but no dc or visual disturbance.. Yesterday temp was close to 100.  Then temp went back down yesterday afternoon. Temp today was 100.4 spiking up.   Pt tested himself for covid last night with negative result.. Will test tomorrow again.  Cough is dry with faint  wheezing. Pt is using inhaler. Used symbicort and helps a lot.    Pt states hx of getting pneumonia easily and has hx of multiple myeloma.        Observations/Objective: General-no acute distress, pleasant, oriented. Lungs- on inspection lungs appear unlabored. Neck- no tracheal deviation or jvd on inspection. Neuro- gross motor function appears intact. Left eye- obvious moderate conjunctivitis.   Assessment and Plan: Recent onset of fever, cough and conjunctivitis.  Symptoms started on Saturday night.  Initial Covid test today was negative and will repeat rapid Covid test tomorrow morning.  If rapid Covid test positive notify us.  If negative call Covid test centerline and get scheduled for PCR.  Patient given the [phone].  Patient does have multiple myeloma and history of pneumonia fairly easily per his report and review of chart.  Decided to go ahead and have him start  a azithromycin antibiotic.  Also sent in benzonatate cough tablets.  For left eye conjunctivitis I sent in prescription of Tobrex.  Future chest x-ray placed.  Might need to get later this week depending on how he does with treatment.  For wheezing or shortness of breath advised continue Symbicort.  Follow-up in 7 days or as needed.  Time spent with patient today was 25  minutes which consisted of chart review, discussing diagnosis, work up, treatment and documentation.  Follow Up Instructions:    I discussed the assessment and treatment plan with the patient. The patient was provided an opportunity to ask questions and all were answered. The patient agreed with the plan and demonstrated an understanding of the instructions.   The patient was advised to call back or seek an in-person evaluation if the symptoms worsen or if the condition fails to improve as anticipated.     Mackie Pai, PA-C   Review of Systems     Objective:   Physical Exam        Assessment & Plan:

## 2020-09-10 NOTE — Patient Instructions (Signed)
Recent onset of fever, cough and conjunctivitis.  Symptoms started on Saturday night.  Initial Covid test today was negative and will repeat rapid Covid test tomorrow morning.  If rapid Covid test positive notify us.  If negative call Covid test centerline and get scheduled for PCR.  Patient given the [phone].  Patient does have multiple myeloma and history of pneumonia fairly easily per his report and review of chart.  Decided to go ahead and have him start a azithromycin antibiotic.  Also sent in benzonatate cough tablets.  For left eye conjunctivitis I sent in prescription of Tobrex.  Future chest x-ray placed.  Might need to get later this week depending on how he does with treatment.  For wheezing or shortness of breath advised continue Symbicort.  Follow-up in 7 days or as needed.

## 2020-09-11 ENCOUNTER — Other Ambulatory Visit: Payer: Medicare Other

## 2020-09-11 DIAGNOSIS — Z20822 Contact with and (suspected) exposure to covid-19: Secondary | ICD-10-CM

## 2020-09-12 ENCOUNTER — Other Ambulatory Visit: Payer: Self-pay

## 2020-09-12 ENCOUNTER — Ambulatory Visit (HOSPITAL_BASED_OUTPATIENT_CLINIC_OR_DEPARTMENT_OTHER)
Admission: RE | Admit: 2020-09-12 | Discharge: 2020-09-12 | Disposition: A | Discharge status: Home / Self Care | Payer: Medicare Other | Source: Ambulatory Visit | Attending: Medical | Admitting: Medical

## 2020-09-12 ENCOUNTER — Encounter: Payer: Self-pay | Admitting: Medical

## 2020-09-12 DIAGNOSIS — R059 Cough, unspecified: Secondary | ICD-10-CM | POA: Diagnosis present

## 2020-09-12 LAB — SARS-COV-2, NAA 2 DAY TAT

## 2020-09-12 LAB — NOVEL CORONAVIRUS, NAA: SARS-CoV-2, NAA: NOT DETECTED

## 2020-09-13 ENCOUNTER — Other Ambulatory Visit: Payer: Self-pay

## 2020-09-13 ENCOUNTER — Emergency Department (HOSPITAL_BASED_OUTPATIENT_CLINIC_OR_DEPARTMENT_OTHER): Payer: Medicare Other

## 2020-09-13 ENCOUNTER — Emergency Department (HOSPITAL_BASED_OUTPATIENT_CLINIC_OR_DEPARTMENT_OTHER)
Admission: EM | Admit: 2020-09-13 | Discharge: 2020-09-13 | Disposition: A | Discharge status: Home / Self Care | Payer: Medicare Other | Attending: Emergency Medicine | Admitting: Emergency Medicine

## 2020-09-13 ENCOUNTER — Encounter (HOSPITAL_BASED_OUTPATIENT_CLINIC_OR_DEPARTMENT_OTHER): Payer: Self-pay | Admitting: *Deleted

## 2020-09-13 ENCOUNTER — Ambulatory Visit: Payer: Medicare Other | Admitting: Medical

## 2020-09-13 DIAGNOSIS — Z955 Presence of coronary angioplasty implant and graft: Secondary | ICD-10-CM | POA: Diagnosis not present

## 2020-09-13 DIAGNOSIS — Z87891 Personal history of nicotine dependence: Secondary | ICD-10-CM | POA: Insufficient documentation

## 2020-09-13 DIAGNOSIS — E876 Hypokalemia: Secondary | ICD-10-CM

## 2020-09-13 DIAGNOSIS — Z7901 Long term (current) use of anticoagulants: Secondary | ICD-10-CM | POA: Diagnosis not present

## 2020-09-13 DIAGNOSIS — J3489 Other specified disorders of nose and nasal sinuses: Secondary | ICD-10-CM | POA: Diagnosis not present

## 2020-09-13 DIAGNOSIS — R0602 Shortness of breath: Secondary | ICD-10-CM | POA: Diagnosis not present

## 2020-09-13 DIAGNOSIS — R509 Fever, unspecified: Secondary | ICD-10-CM | POA: Insufficient documentation

## 2020-09-13 DIAGNOSIS — Z79899 Other long term (current) drug therapy: Secondary | ICD-10-CM | POA: Diagnosis not present

## 2020-09-13 DIAGNOSIS — R0981 Nasal congestion: Secondary | ICD-10-CM | POA: Insufficient documentation

## 2020-09-13 DIAGNOSIS — R059 Cough, unspecified: Secondary | ICD-10-CM | POA: Diagnosis present

## 2020-09-13 DIAGNOSIS — I10 Essential (primary) hypertension: Secondary | ICD-10-CM | POA: Diagnosis not present

## 2020-09-13 LAB — D-DIMER, QUANTITATIVE: D-Dimer, Quant: 1.13 ug/mL-FEU — ABNORMAL HIGH (ref 0.00–0.50)

## 2020-09-13 LAB — CBC WITH DIFFERENTIAL/PLATELET
Abs Immature Granulocytes: 0.01 10*3/uL (ref 0.00–0.07)
Basophils Absolute: 0 10*3/uL (ref 0.0–0.1)
Basophils Relative: 1 %
Eosinophils Absolute: 0.1 10*3/uL (ref 0.0–0.5)
Eosinophils Relative: 5 %
HCT: 40.5 % (ref 39.0–52.0)
Hemoglobin: 14.4 g/dL (ref 13.0–17.0)
Immature Granulocytes: 0 %
Lymphocytes Relative: 41 %
Lymphs Abs: 1.2 10*3/uL (ref 0.7–4.0)
MCH: 35.7 pg — ABNORMAL HIGH (ref 26.0–34.0)
MCHC: 35.6 g/dL (ref 30.0–36.0)
MCV: 100.5 fL — ABNORMAL HIGH (ref 80.0–100.0)
Monocytes Absolute: 0.7 10*3/uL (ref 0.1–1.0)
Monocytes Relative: 22 %
Neutro Abs: 0.9 10*3/uL — ABNORMAL LOW (ref 1.7–7.7)
Neutrophils Relative %: 31 %
Platelets: 165 10*3/uL (ref 150–400)
RBC: 4.03 MIL/uL — ABNORMAL LOW (ref 4.22–5.81)
RDW: 12.2 % (ref 11.5–15.5)
Smear Review: ADEQUATE
WBC: 2.9 10*3/uL — ABNORMAL LOW (ref 4.0–10.5)
nRBC: 0 % (ref 0.0–0.2)

## 2020-09-13 LAB — COMPREHENSIVE METABOLIC PANEL
ALT: 80 U/L — ABNORMAL HIGH (ref 0–44)
AST: 75 U/L — ABNORMAL HIGH (ref 15–41)
Albumin: 3.6 g/dL (ref 3.5–5.0)
Alkaline Phosphatase: 51 U/L (ref 38–126)
Anion gap: 10 (ref 5–15)
BUN: 14 mg/dL (ref 8–23)
CO2: 25 mmol/L (ref 22–32)
Calcium: 8.6 mg/dL — ABNORMAL LOW (ref 8.9–10.3)
Chloride: 102 mmol/L (ref 98–111)
Creatinine, Ser: 0.98 mg/dL (ref 0.61–1.24)
GFR, Estimated: >60 mL/min (ref >60)
Glucose, Bld: 105 mg/dL — ABNORMAL HIGH (ref 70–99)
Potassium: 3.3 mmol/L — ABNORMAL LOW (ref 3.5–5.1)
Sodium: 137 mmol/L (ref 135–145)
Total Bilirubin: 0.8 mg/dL (ref 0.3–1.2)
Total Protein: 7.2 g/dL (ref 6.5–8.1)

## 2020-09-13 MED ORDER — GUAIFENESIN-CODEINE 100-10 MG/5ML PO SOLN: 10.0000 mL | Freq: Four times a day (QID) | ORAL | 0 refills | Status: DC | PRN

## 2020-09-13 MED ORDER — IOHEXOL 350 MG/ML SOLN
100.0000 mL | Freq: Once | INTRAVENOUS | Status: AC | PRN
  Administered 2020-09-13: 100 mL via INTRAVENOUS

## 2020-09-13 MED ORDER — POTASSIUM CHLORIDE CRYS ER 20 MEQ PO TBCR
40.0000 meq | EXTENDED_RELEASE_TABLET | Freq: Once | ORAL | Status: AC
  Administered 2020-09-13: 40 meq via ORAL
  Filled 2020-09-13: qty 2

## 2020-09-13 NOTE — Discharge Instructions (Addendum)
You came to the emergency department today to be evaluated for your cough and shortness of breath. Your CT scan showed no evidence of a pulmonary embolus pneumonia infection. He is continue to take your prescribed azithromycin. Please use Tylenol and/or ibuprofen to treat any fevers. I have given you prescription for cough syrup. You may use 10 mL of the syrup every 6 hours as needed for cough. This cough syrup contains codeine and therefore should not drive or operate heavy machinery when taking this medication. These follow-up with your primary care provider for an appointment as soon as possible for reassessment.  Your lab work showed that your potassium was slightly lowered. You received potassium while in the emergency department to correct this. You see your primary care provider please work up this result as you may want to recheck your values and monitoring moving forward.  Today you prescribed medications that may make you sleepy or impair your ability to make decisions.  Please do not drive, operate heavy machinery, care for a small child with out another adult present, or perform any activities that may cause harm to you or someone else if you were to fall asleep or be impaired.   Get help right away if: You cough up blood. You have difficulty breathing. Your heartbeat is very fast.

## 2020-09-13 NOTE — ED Triage Notes (Signed)
Cough x 5 days. C.o low oxygen sats on arrival to registration. Sat check denotes 95% R/A.  He has had 3 negative Covid test. No pain.

## 2020-09-13 NOTE — ED Notes (Signed)
Clear lungs snds bilat.   Covid test at home all negative and wife in room with Pt.

## 2020-09-13 NOTE — ED Provider Notes (Signed)
[facility] EMERGENCY DEPARTMENT Provider Note   CSN: [id number] Arrival date & time: 09/13/20  1212     History Chief Complaint  Patient presents with  . Cough    Jorge [name] is a 65 y.o. male history of multiple myeloma currently on lenalidomide (REVLIMID) 5 MG, history of DVT currently on Xarelto 54m, hiatal hernia, hypertension.  Patient presents with chief complaint of complaint of cough and fevers.  Patient reports that his symptoms began on Saturday 09/08/20.  Patient reports that his cough is nonproductive.  Worse at rest.  Patient denies any alleviating factors.  Patient reports that he has been febrile intermittently since 2/26.  Patient reports T-max 100.7 Fahrenheit orally in the last 24 hours.  Patient endorses shortness of breath.  Patient reports that he has nasal congestion and rhinorrhea at baseline due to allergies denies any change in the symptoms.  Patient reports that using home SPO2 he noticed that his oxygen saturation has been in the low 90s since his symptoms began.  Patient reports that he noticed today that his oxygen saturation dropped to 88% with ambulation.  Patient reports that last week he did fly twice.  Denies any unilateral leg swelling or tenderness, hemoptysis, hormone therapy.  Has not missed any dose of his Xarelto  Patient denies any sore throat, abdominal pain, nausea, vomiting, diarrhea, dysuria, urinary frequency, hematuria, confusion, lightheadedness, dizziness, syncopal episodes.    Patient was seen by his primary care provider who initiated azithromycin and Tessalon.  Patient is on day 3 of azithromycin.  Patient denies any relief of symptoms with Tessalon.  Patient does take lenalidomide 21 days on and 7 days off.  Patient reports that last day of this medication was Tuesday 3/1.  She has been fully vaccinated for COVID-19, as well as receiving 2 booster vaccinations.  Patient has been tested 3 times for COVID-19 symptoms again.   Patient reports to negative rapid test as well as 1 negative PCR test.  HPI     Past Medical History:  Diagnosis Date  . Allergy   . Anxiety   . Blood transfusion without reported diagnosis 2018  . Bone metastasis ([place]   . Chest cold 05/19/2016   productive cough  -- started on antibiotic  . Chronic back pain    due to bone mets from myeloma  . Cough   . Depression   . Diverticulitis   . GERD (gastroesophageal reflux disease)   . Hiatal hernia   . History of chicken pox   . History of concussion    age 65-- no residual  . History of DVT of lower extremity 03/21/2016  treated and completed w/ xarelto   per doppler left extensive occlusion common femoral, femoral, and popliteal veins and right partial occlusion common femoral and profunda femoral veins/  last doppler 06-04-2016 no evidence acute or chronic dvt noted either leg   . History of radiation therapy 06/09/16-06/23/16   lower thoracic spine 25 Gy in 10 fractions  . Mouth ulcers    secondary to radiation  . Multiple myeloma ([place] 02/22/2016 via bone marrow bx---  oncologist-  dr eMarin Olp  IgG Kappa-- Hyperdiploid/ +11 w/ bone mets--  current treatment chemotherapy (started 08/ 2017)and pallitive radiation to back started 06-09-2016  . Renal calculus, right   . Wears contact lenses     Patient Active Problem List   Diagnosis Date Noted  . Hiatal hernia with GERD 08/13/2018  . Depression 07/30/2018  . Anxiety  07/30/2018  . Infection due to human metapneumovirus (hMPV) 02/04/2018  . Neutropenia with fever ([place]) 02/02/2018  . SOB (shortness of breath)   . Pancytopenia ([place]) 02/01/2018  . Bacteremia 11/10/2017  . Sepsis ([place]) 11/09/2017  . HCAP (healthcare-associated pneumonia) 11/09/2017  . Thrombocytopenia ([place]) 11/09/2017  . Iron deficiency anemia due to chronic blood loss   . Neutropenia ([place]) 03/02/2017  . Cellulitis 03/01/2017  . Phlebitis or thrombophlebitis of lower extremity 07/21/2016  . Visit for  preventive health examination 05/16/2016  . Aphthous ulcer 05/16/2016  . Ureterolithiasis 05/16/2016  . Acute deep vein thrombosis (DVT) of femoral vein of left lower extremity ([place]) 03/21/2016  . Multiple myeloma ([place]) 02/22/2016  . Multiple myeloma not having achieved remission ([place]) 02/22/2016  . Bilateral inguinal hernia (BIH), s/p lap repair 12/16/2013 11/02/2013    Past Surgical History:  Procedure Laterality Date  . COLONOSCOPY  [id number]  . COLONOSCOPY  01/07/2017   Dr Penelope Coop, [facility]  . CYSTOSCOPY W/ URETERAL STENT PLACEMENT Right 06/20/2016   Procedure: CYSTOSCOPY WITH STENT REPLACEMENT;  Surgeon: Kathie Rhodes, MD;  Location: [facility];  Service: Urology;  Laterality: Right;  . CYSTOSCOPY WITH RETROGRADE PYELOGRAM, URETEROSCOPY AND STENT PLACEMENT Right 05/30/2016   Procedure: CYSTOSCOPY WITH RETROGRADE PYELOGRAM, URETEROSCOPY AND STENT PLACEMENT,DILITATION URETERAL STRICTURE;  Surgeon: Kathie Rhodes, MD;  Location: [facility];  Service: Urology;  Laterality: Right;  . CYSTOSCOPY/RETROGRADE/URETEROSCOPY/STONE EXTRACTION WITH BASKET Right 06/20/2016   Procedure: CYSTOSCOPY/URETEROSCOPY/STONE EXTRACTION WITH BASKET;  Surgeon: Kathie Rhodes, MD;  Location: [facility];  Service: Urology;  Laterality: Right;  . HIATAL HERNIA REPAIR N/A 08/13/2018   Procedure: LAPAROSCOPIC REPAIR OF HIATAL HERNIA REPAIR WITH FUNDOPLICATION AND INSERTION OF MESH;  Surgeon: Excell Seltzer, MD;  Location: [facility];  Service: General;  Laterality: N/A;  . HOLMIUM LASER APPLICATION Right 34/03/1790   Procedure: HOLMIUM LASER APPLICATION;  Surgeon: Kathie Rhodes, MD;  Location: [facility];  Service: Urology;  Laterality: Right;  . IR GENERIC HISTORICAL  02/11/2016   IR RADIOLOGIST EVAL & MGMT 02/11/2016 [facility]  . IR GENERIC HISTORICAL  02/15/2016   IR BONE TUMOR(S)RF ABLATION 02/15/2016 Luanne Bras, MD [facility]  . IR GENERIC HISTORICAL  02/15/2016   IR BONE  TUMOR(S)RF ABLATION 02/15/2016 Luanne Bras, MD [facility]  . IR GENERIC HISTORICAL  02/15/2016   IR BONE TUMOR(S)RF ABLATION 02/15/2016 Luanne Bras, MD [facility]  . IR GENERIC HISTORICAL  02/15/2016   IR KYPHO THORACIC WITH BONE BIOPSY 02/15/2016 Luanne Bras, MD [facility]  . IR GENERIC HISTORICAL  02/15/2016   IR KYPHO THORACIC WITH BONE BIOPSY 02/15/2016 Luanne Bras, MD [facility]  . IR GENERIC HISTORICAL  02/15/2016   IR VERTEBROPLASTY CERV/THOR BX INC UNI/BIL INC/INJECT/IMAGING 02/15/2016 Luanne Bras, MD [facility]  . IR GENERIC HISTORICAL  03/13/2016   IR KYPHO EA ADDL LEVEL THORACIC OR LUMBAR 03/13/2016 Luanne Bras, MD [facility]  . IR GENERIC HISTORICAL  03/13/2016   IR KYPHO EA ADDL LEVEL THORACIC OR LUMBAR 03/13/2016 Luanne Bras, MD [facility]  . IR GENERIC HISTORICAL  03/13/2016   IR BONE TUMOR(S)RF ABLATION 03/13/2016 Luanne Bras, MD [facility]  . IR GENERIC HISTORICAL  03/13/2016   IR KYPHO LUMBAR INC FX REDUCE BONE BX UNI/BIL CANNULATION INC/IMAGING 03/13/2016 Luanne Bras, MD [facility]  . IR GENERIC HISTORICAL  03/13/2016   IR BONE TUMOR(S)RF ABLATION 03/13/2016 Luanne Bras, MD [facility]  . IR GENERIC HISTORICAL  03/13/2016   IR BONE TUMOR(S)RF ABLATION 03/13/2016  Luanne Bras, MD [facility]  . IR GENERIC HISTORICAL  03/31/2016   IR RADIOLOGIST EVAL & MGMT 03/31/2016 [facility]  . KYPHOPLASTY     02/2016, 03/2016, 11/2016  . LAPAROSCOPIC INGUINAL HERNIA REPAIR Bilateral 12-16-2013  dr gross  . RADIOLOGY WITH ANESTHESIA N/A 02/15/2016   Procedure: Spinal Ablation;  Surgeon: Luanne Bras, MD;  Location: [facility];  Service: Radiology;  Laterality: N/A;  . RADIOLOGY WITH ANESTHESIA N/A 03/13/2016   Procedure: LUMBER ABLATION;  Surgeon: Luanne Bras, MD;  Location: [facility];  Service: Radiology;  Laterality: N/A;  . ROTATOR CUFF REPAIR Right 2003  . spine operation     x1 in 2018 x3 in 2017  . TONSILLECTOMY   age 72  . VERTEBROPLASTY  01/2016  . WISDOM TOOTH EXTRACTION         Family History  Problem Relation Age of Onset  . Uterine cancer Mother   . Ovarian cancer Mother   . Colon cancer Mother        said it was rectal or colon but not sure  . Rectal cancer Mother   . Heart disease Father   . Hypertension Father   . Multiple sclerosis Sister   . Paranoid behavior Brother   . Drug abuse Brother   . Schizophrenia Brother   . Stroke Maternal Grandfather   . Cancer Maternal Aunt   . Leukemia Paternal Aunt   . Healthy Son        x1  . Healthy Daughter        x2  . Allergies Daughter        x1  . Diabetes Neg Hx   . Alzheimer's disease Neg Hx   . Parkinson's disease Neg Hx   . Esophageal cancer Neg Hx   . Stomach cancer Neg Hx     Social History   Tobacco Use  . Smoking status: Former Smoker    Packs/day: 1.00    Years: 7.00    Pack years: 7.00    Types: Cigarettes    Quit date: 11/02/1980    Years since quitting: 39.8  . Smokeless tobacco: Never Used  Vaping Use  . Vaping Use: Never used  Substance Use Topics  . Alcohol use: Yes    Comment: occasional  . Drug use: No    Home Medications Prior to Admission medications   Medication Sig Start Date End Date Taking? Authorizing Provider  acetaminophen (TYLENOL) 325 MG tablet Take 2 tablets (650 mg total) by mouth every 6 (six) hours as needed for mild pain (or Fever >/= 101). Patient taking differently: Take 325 mg by mouth every 6 (six) hours as needed for mild pain (or Fever >/= 101). 02/04/18   Eugenie Filler, MD  albuterol (VENTOLIN HFA) 108 (90 Base) MCG/ACT inhaler TAKE 2 PUFFS BY MOUTH EVERY 6 HOURS AS NEEDED FOR WHEEZE OR SHORTNESS OF BREATH 06/04/20   [name], MD  amLODipine (NORVASC) 10 MG tablet Take 10 mg by mouth daily. 05/30/20   [provider]  amLODipine (NORVASC) 5 MG tablet Take by mouth. 03/21/20   [provider]  amoxicillin (AMOXIL) 500 MG capsule Take 2 capsules  (1,000 mg total) by mouth See admin instructions. Take 1000 mg 1 hour prior to dental work Patient not taking: Reported on 09/10/2020 08/13/20   [name], MD  azithromycin (ZITHROMAX) 250 MG tablet Take 2 tablets by mouth on day 1, followed by 1 tablet by mouth daily for 4 days. 09/10/20  Saguier, Percell Miller, PA-C  benzonatate (TESSALON) 100 MG capsule Take 1 capsule (100 mg total) by mouth 3 (three) times daily as needed for cough. 09/10/20   Saguier, Percell Miller, PA-C  cetirizine (ZYRTEC) 10 MG tablet Take 10 mg by mouth daily as needed for allergies.    [provider]  Cholecalciferol (VITAMIN D3) 250 MCG (10000 UT) capsule Take 10,000 Units by mouth daily.    [provider]  diphenoxylate-atropine (LOMOTIL) 2.5-0.025 MG tablet Take 1 tablet by mouth 4 (four) times daily as needed for diarrhea or loose stools. 04/02/20   [name], MD  famciclovir (FAMVIR) 500 MG tablet Take 500 mg by mouth daily.    [provider]  fluorouracil (EFUDEX) 5 % cream APPLY TO AFFECTED AREA TWICE A DAY 08/31/19   [provider]  fluticasone (FLONASE) 50 MCG/ACT nasal spray SPRAY 2 SPRAYS INTO EACH NOSTRIL EVERY DAY 04/02/20   Saguier, Percell Miller, PA-C  lenalidomide (REVLIMID) 5 MG capsule TAKE 1 CAPSULE BY MOUTH  DAILY FOR 21 DAYS, THEN 7  DAYS OFF Auth# [id number] 09/06/20   [name], MD  lidocaine (LIDODERM) 5 % Place 1 patch onto the skin daily as needed (pain).  12/19/16   [provider]  lisinopril (ZESTRIL) 40 MG tablet Take 40 mg by mouth daily. 07/28/19   [provider]  Loperamide HCl (IMODIUM PO) Take by mouth as needed.    [provider]  OVER THE COUNTER MEDICATION Take 1 capsule by mouth daily. methylcare otc supplement    [provider]  pantoprazole (PROTONIX) 20 MG tablet TAKE 1 TABLET BY MOUTH  DAILY 08/19/19   Cirigliano, Vito V, DO  Probiotic CAPS Take 1 capsule by mouth daily.    [provider]  SYMBICORT 160-4.5  MCG/ACT inhaler TAKE 2 PUFFS BY MOUTH TWICE A DAY 07/16/20   Saguier, Percell Miller, PA-C  tobramycin (TOBREX) 0.3 % ophthalmic solution Place 2 drops into the left eye every 6 (six) hours. 09/10/20   Saguier, Percell Miller, PA-C  Vitamins-Lipotropics (LIPO-FLAVONOID PLUS PO) Take 1 tablet by mouth daily.    [provider]  XARELTO 10 MG TABS tablet Take 1 tablet (10 mg total) by mouth daily. 07/05/20   [name], MD  zolpidem (AMBIEN) 5 MG tablet Take 1 tablet (5 mg total) by mouth at bedtime. 05/29/20   Cottle, Billey Co., MD    Allergies    Patient has no known allergies.  Review of Systems   Review of Systems  Constitutional: Positive for fever. Negative for chills.  HENT: Positive for congestion and rhinorrhea. Negative for sore throat.   Eyes: Negative for visual disturbance.  Respiratory: Positive for cough and shortness of breath.   Cardiovascular: Negative for chest pain and leg swelling.  Gastrointestinal: Negative for abdominal distention, abdominal pain, constipation, diarrhea, nausea and vomiting.  Genitourinary: Negative for difficulty urinating, dysuria, frequency and hematuria.  Musculoskeletal: Negative for back pain and neck pain.  Skin: Negative for color change and rash.  Neurological: Negative for dizziness, syncope, light-headedness and headaches.  Psychiatric/Behavioral: Negative for confusion.    Physical Exam Updated Vital Signs BP 138/81 (BP Location: Right Arm)   Pulse 72   Temp 98.1 F (36.7 C) (Oral)   Resp (!) 23   Ht _0  (1.702 m)   Wt 86 kg   SpO2 96%   BMI 29.71 kg/m   Physical Exam Vitals and nursing note reviewed.  Constitutional:      General: He is  not in acute distress.    Appearance: He is not ill-appearing, toxic-appearing or diaphoretic.  HENT:     Head: Normocephalic and atraumatic.  Eyes:     General: No scleral icterus.       Right eye: No discharge.        Left eye: No discharge.  Neck:     Vascular: No JVD.   Cardiovascular:     Rate and Rhythm: Normal rate.     Heart sounds: Normal heart sounds.  Pulmonary:     Effort: Pulmonary effort is normal. No tachypnea, bradypnea or respiratory distress.     Breath sounds: Normal breath sounds. No stridor. No decreased breath sounds, wheezing, rhonchi or rales.  Musculoskeletal:     Cervical back: Neck supple.     Right lower leg: No swelling, deformity, lacerations, tenderness or bony tenderness. No edema.     Left lower leg: No swelling, deformity, lacerations, tenderness or bony tenderness. No edema.  Skin:    General: Skin is warm and dry.     Coloration: Skin is not cyanotic, jaundiced or pale.  Neurological:     General: No focal deficit present.     Mental Status: He is alert.  Psychiatric:        Behavior: Behavior is cooperative.     ED Results / Procedures / Treatments   Labs (all labs ordered are listed, but only abnormal results are displayed) Labs Reviewed  COMPREHENSIVE METABOLIC PANEL - Abnormal; Notable for the following components:      Result Value   Potassium 3.3 (*)    Glucose, Bld 105 (*)    Calcium 8.6 (*)    AST 75 (*)    ALT 80 (*)    All other components within normal limits  CBC WITH DIFFERENTIAL/PLATELET - Abnormal; Notable for the following components:   WBC 2.9 (*)    RBC 4.03 (*)    MCV 100.5 (*)    MCH 35.7 (*)    Neutro Abs 0.9 (*)    All other components within normal limits  D-DIMER, QUANTITATIVE - Abnormal; Notable for the following components:   D-Dimer, Quant 1.13 (*)    All other components within normal limits  PATHOLOGIST SMEAR REVIEW    EKG None  Radiology DG Chest 2 View  Result Date: 09/12/2020 CLINICAL DATA:  Cough and congestion EXAM: CHEST - 2 VIEW COMPARISON:  05/14/2020 FINDINGS: Chronic elevation right diaphragm. Streaky atelectasis or scarring at the bases. No acute consolidation or effusion. Stable cardiomediastinal silhouette. No pneumothorax. Multiple treated compression  deformities of the thoracolumbar spine. Small hiatal hernia IMPRESSION: Chronic elevation of right diaphragm with streaky atelectasis or scarring at both bases. No significant change since 05/14/2020 Electronically Signed   By: Donavan Foil M.D.   On: 09/12/2020 16:19   CT Angio Chest PE W and/or Wo Contrast  Result Date: 09/13/2020 CLINICAL DATA:  Cough for 5 days, hypoxia, positive D dimer, history of multiple myeloma EXAM: CT ANGIOGRAPHY CHEST WITH CONTRAST TECHNIQUE: Multidetector CT imaging of the chest was performed using the standard protocol during bolus administration of intravenous contrast. Multiplanar CT image reconstructions and MIPs were obtained to evaluate the vascular anatomy. CONTRAST:  122m OMNIPAQUE IOHEXOL 350 MG/ML SOLN COMPARISON:  09/13/2020, 10/14/2017 FINDINGS: Cardiovascular: This is a technically adequate evaluation of the pulmonary vasculature. No filling defects or pulmonary emboli. The heart is unremarkable without pericardial effusion. Normal caliber of the thoracic aorta with no evidence of dissection. Mediastinum/Nodes: No enlarged mediastinal, hilar,  or axillary lymph nodes. Thyroid gland, trachea, and esophagus demonstrate no significant findings. Small hiatal hernia. Lungs/Pleura: Scattered areas of somewhat linear consolidation identified, most pronounced within the right apex, lingula, and bilateral lower lobe costophrenic angles, most consistent with subsegmental atelectasis. No acute airspace disease, effusion, or pneumothorax. Central airways are patent. Chronic elevation of the right hemidiaphragm. Upper Abdomen: Diffuse hepatic steatosis. No acute upper abdominal findings. Musculoskeletal: Stable expansile lytic lesions of the left lateral first and second ribs consistent with known history of multiple myeloma. There are no acute displaced fractures. Bones are osteopenic, with multilevel thoracolumbar compression deformities and previous vertebral augmentation.  Reconstructed images demonstrate no additional findings. Review of the MIP images confirms the above findings. IMPRESSION: 1. No evidence of pulmonary embolus. 2. Scattered areas of subsegmental atelectasis without acute airspace disease. 3. Hepatic steatosis. 4. Chronic elevation right hemidiaphragm. 5. Stable lytic lesions left first and second ribs consistent with history of multiple myeloma. 6. Stable hiatal hernia. Electronically Signed   By: Randa Ngo M.D.   On: 09/13/2020 15:52   DG Chest Port 1 View  Result Date: 09/13/2020 CLINICAL DATA:  Low oxygen saturation.  Cough. EXAM: PORTABLE CHEST 1 VIEW COMPARISON:  09/12/2020 FINDINGS: Marked elevation right hemidiaphragm unchanged with associated right lower lobe atelectasis. Heart size enlarged. Negative for heart failure. Mild left lower lobe atelectasis, unchanged. No pleural effusion. IMPRESSION: Elevated right hemidiaphragm. Mild bibasilar atelectasis. Mild improvement of left lower lobe atelectasis. Electronically Signed   By: Franchot Gallo M.D.   On: 09/13/2020 13:32    Procedures Procedures   Medications Ordered in ED Medications  iohexol (OMNIPAQUE) 350 MG/ML injection 100 mL (100 mLs Intravenous Contrast Given 09/13/20 1523)  potassium chloride SA (KLOR-CON) CR tablet 40 mEq (40 mEq Oral Given 09/13/20 1736)    ED Course  I have reviewed the triage vital signs and the nursing notes.  Pertinent labs & imaging results that were available during my care of the patient were reviewed by me and considered in my medical decision making (see chart for details).    MDM Rules/Calculators/A&P                          Alert 65 year old male in no acute distress, nontoxic-appearing.  Patient able to speak in full complete sentences without any difficulty.  Patient oxygenation 94% or greater on room air.  Patient presents with complaint of nonproductive cough, fevers and shortness of breath.  Patient reports that he has had 2 negtive rapid  Covid test and 1 negative Covid PCR test.  Patient states that he was seen by his primary care provider and is on day 3 of azithromycin.  Patient also reports receiving Tessalon which has not relieved his cough symptoms.  Ambulated patient at bedside SPO2 was noted to be 93% on room air after 1 minute of ambulation.    Patient reports that last week he flew multiple times.  Patient also reports that he has a history of DVT.  Patient is currently on 10 mg Xarelto.  Due to patient's history of DVT, recent travel, and shortness of breath will obtain D-dimer.  Due to patient's complaint of cough and shortness of breath will obtain chest x-ray.    Low suspicion for COVID-19 infection as patient has received immunization x2, booster x2, and has multiple documented negative COVID-19 test since his symptoms began.  Chest x-ray shows no consolidation, pleural effusion.  Elevated right hemidiaphragm and mild bibasilar atelectasis  noted.  Lungs clear to auscultation bilaterally.  Low suspicion for pneumonia or pulmonary edema.  D-dimer was found to be elevated will order CTA chest to evaluate for pulmonary embolism.  CTA chest showed no evidence of pulmonary embolus; scattered areas of subsegmental atelectasis without acute airspace disease; hepatic steatosis; chronic elevation of right hemidiaphragm; stable lytic lesions left first and second ribs consistent with history of multiple myeloma; stable hiatal hernia.  CBC showed leukopenia at 2.9; this is likely due to taking lenalidomide (REVLIMID).  This value is  consistent with patient's baseline as he is ranged from 3.6-2.8 over the last year.  Absolute neutrophil count 0.9 again this values consistent with previous as he is ranged from 1.6-0.9 over the last year.  CMP showed potassium slightly lowered at 3.3.  Patient was given K. Dur 51mq; will have patient follow-up with his primary care provider for repeat testing.  CMP also showed AST and ALT elevated  at 75 and 80 respectively. Patient's AST and ALT have been slightly elevated over the past year but not to this extent.  It is possible that patient's elevated liver enzymes are due to his use of lenalidomide.  We will have patient follow-up with his primary care provider.  On serial reexamination patient remains in no acute distress, able to speak in full complete sentences without difficulty oxygen saturation 94% or greater on room air.  Will discharge patient with follow-up with PCP.  Patient to complete his Z-Pak.  Patient was prescribed guaifenesin-codeine cough syrup.  Patient advised not to operate heavy machinery or drive while taking this medication as it can cause sedation.  Discussed results, findings, treatment and follow up. Patient advised of return precautions. Patient verbalized understanding and agreed with plan.  Patient was discussed with and evaluated by Dr. GRegenia Skeeter    Final Clinical Impression(s) / ED Diagnoses Final diagnoses:  Cough    Rx / DC Orders ED Discharge Orders         Ordered    guaiFENesin-codeine 100-10 MG/5ML syrup  Every 6 hours PRN        09/13/20 [address] PA-C 09/13/20 2220    GSherwood Gambler MD 09/14/20 [phone]

## 2020-09-13 NOTE — Telephone Encounter (Signed)
Spoke with patient around 10:00 am to cancel his 10:40  appointment with our office due to him having a fever and cough still and advised pt to call the covid clinic to get an appointment. The covid clinic turned him down due to negative PCR test , advised patient ED visit , patient states he will go to ED.

## 2020-09-14 LAB — PATHOLOGIST SMEAR REVIEW

## 2020-09-29 ENCOUNTER — Encounter: Payer: Self-pay | Admitting: Medical

## 2020-10-01 ENCOUNTER — Other Ambulatory Visit: Payer: Self-pay | Admitting: *Deleted

## 2020-10-01 DIAGNOSIS — C9000 Multiple myeloma not having achieved remission: Secondary | ICD-10-CM

## 2020-10-01 MED ORDER — LENALIDOMIDE 5 MG PO CAPS: ORAL_CAPSULE | ORAL | 0 refills | Status: DC

## 2020-10-02 ENCOUNTER — Ambulatory Visit (INDEPENDENT_AMBULATORY_CARE_PROVIDER_SITE_OTHER): Payer: Medicare Other | Admitting: Medical

## 2020-10-02 ENCOUNTER — Other Ambulatory Visit: Payer: Self-pay

## 2020-10-02 VITALS — BP 136/81 | HR 66 | Temp 98.1°F | Resp 18 | Ht 67.0 in | Wt 183.0 lb

## 2020-10-02 DIAGNOSIS — E876 Hypokalemia: Secondary | ICD-10-CM

## 2020-10-02 DIAGNOSIS — J309 Allergic rhinitis, unspecified: Secondary | ICD-10-CM

## 2020-10-02 DIAGNOSIS — J3489 Other specified disorders of nose and nasal sinuses: Secondary | ICD-10-CM

## 2020-10-02 LAB — CBC WITH DIFFERENTIAL/PLATELET
Basophils Absolute: 0 10*3/uL (ref 0.0–0.1)
Basophils Relative: 1.1 % (ref 0.0–3.0)
Eosinophils Absolute: 0.1 10*3/uL (ref 0.0–0.7)
Eosinophils Relative: 2.8 % (ref 0.0–5.0)
HCT: 44.9 % (ref 39.0–52.0)
Hemoglobin: 15.4 g/dL (ref 13.0–17.0)
Lymphocytes Relative: 47.4 % — ABNORMAL HIGH (ref 12.0–46.0)
Lymphs Abs: 1.8 10*3/uL (ref 0.7–4.0)
MCHC: 34.3 g/dL (ref 30.0–36.0)
MCV: 102.6 fl — ABNORMAL HIGH (ref 78.0–100.0)
Monocytes Absolute: 0.4 10*3/uL (ref 0.1–1.0)
Monocytes Relative: 10.8 % (ref 3.0–12.0)
Neutro Abs: 1.5 10*3/uL (ref 1.4–7.7)
Neutrophils Relative %: 37.9 % — ABNORMAL LOW (ref 43.0–77.0)
Platelets: 209 10*3/uL (ref 150.0–400.0)
RBC: 4.38 Mil/uL (ref 4.22–5.81)
RDW: 13.3 % (ref 11.5–15.5)
WBC: 3.9 10*3/uL — ABNORMAL LOW (ref 4.0–10.5)

## 2020-10-02 MED ORDER — LEVOCETIRIZINE DIHYDROCHLORIDE 5 MG PO TABS: 5.0000 mg | ORAL_TABLET | Freq: Every evening | ORAL | 3 refills | Status: DC

## 2020-10-02 MED ORDER — AZELASTINE HCL 0.1 % NA SOLN: 2.0000 | Freq: Two times a day (BID) | NASAL | 2 refills | Status: DC

## 2020-10-02 NOTE — Patient Instructions (Addendum)
Recent nasal congestion, left frontal sinus pressure and postnasal drainage with subsiding almost resolved cough.  About 3 weeks ago work-up in the emergency department was negative for Covid infection, pneumonia and no PE.   On review do have history of chronic nasal congestion with postnasal drainage.  Also history of seasonal allergies.  Recently symptoms worsen.  Will prescribe Astelin nasal spray, continue Flonase nasal spray and prescribing Xyzal antihistamine.  Hoping that you see some improvement with the next couple days.  If no improvement in left frontal sinus area send me a MyChart message on Thursday morning.  In that event would go ahead and prescribe Augmentin antibiotic.  Also if not having significant improvement would go ahead and place referral to allergist and ENT.  Recent labs showed low potassium so we will repeat metabolic panel today.  With medical history and recent persisting sinus pressure will get CBC.   Follow-up in 7 to 10 days or as needed.

## 2020-10-02 NOTE — Progress Notes (Signed)
Subjective:    Patient ID: Jorge [name], male    DOB: 09/30/1955, 65 y.o.   MRN: [id number]  HPI  Pt in for follow up.  Pt has some hx of chronic mild nasal congestion with pnd for years.  Recently pt has sinus pressure and sensation of sinus ha in left frontal sinus area. Pt feels possible chest congestion. Pt was evaluated in ED on 09/14/2020.    HPI from ED below.  "Jorge [name] is a 65 y.o. male history of multiple myeloma currently on lenalidomide (REVLIMID) 5 MG, history of DVT currently on Xarelto 68m, hiatal hernia, hypertension.  Patient presents with chief complaint of complaint of cough and fevers.  Patient reports that his symptoms began on Saturday 09/08/20.  Patient reports that his cough is nonproductive.  Worse at rest.  Patient denies any alleviating factors.  Patient reports that he has been febrile intermittently since 2/26.  Patient reports T-max 100.7 Fahrenheit orally in the last 24 hours.  Patient endorses shortness of breath.  Patient reports that he has nasal congestion and rhinorrhea at baseline due to allergies denies any change in the symptoms.  Patient reports that using home SPO2 he noticed that his oxygen saturation has been in the low 90s since his symptoms began.  Patient reports that he noticed today that his oxygen saturation dropped to 88% with ambulation.  Patient reports that last week he did fly twice.  Denies any unilateral leg swelling or tenderness, hemoptysis, hormone therapy.  Has not missed any dose of his Xarelto  Patient denies any sore throat, abdominal pain, nausea, vomiting, diarrhea, dysuria, urinary frequency, hematuria, confusion, lightheadedness, dizziness, syncopal episodes.    Patient was seen by his primary care provider who initiated azithromycin and Tessalon.  Patient is on day 3 of azithromycin.  Patient denies any relief of symptoms with Tessalon.  Patient does take lenalidomide 21 days on and 7 days off.  Patient reports  that last day of this medication was Tuesday 3/1.  She has been fully vaccinated for COVID-19, as well as receiving 2 booster vaccinations.  Patient has been tested 3 times for COVID-19 symptoms again.  Patient reports to negative rapid test as well as 1 negative PCR test".   A/P from ED. "Alert 65year old male in no acute distress, nontoxic-appearing.  Patient able to speak in full complete sentences without any difficulty.  Patient oxygenation 94% or greater on room air.  Patient presents with complaint of nonproductive cough, fevers and shortness of breath.  Patient reports that he has had 2 negtive rapid Covid test and 1 negative Covid PCR test.  Patient states that he was seen by his primary care provider and is on day 3 of azithromycin.  Patient also reports receiving Tessalon which has not relieved his cough symptoms.  Ambulated patient at bedside SPO2 was noted to be 93% on room air after 1 minute of ambulation.    Patient reports that last week he flew multiple times.  Patient also reports that he has a history of DVT.  Patient is currently on 10 mg Xarelto.  Due to patient's history of DVT, recent travel, and shortness of breath will obtain D-dimer.  Due to patient's complaint of cough and shortness of breath will obtain chest x-ray.    Low suspicion for COVID-19 infection as patient has received immunization x2, booster x2, and has multiple documented negative COVID-19 test since his symptoms began.  Chest x-ray shows no consolidation, pleural effusion.  Elevated right hemidiaphragm  and mild bibasilar atelectasis noted.  Lungs clear to auscultation bilaterally.  Low suspicion for pneumonia or pulmonary edema.  D-dimer was found to be elevated will order CTA chest to evaluate for pulmonary embolism.  CTA chest showed no evidence of pulmonary embolus; scattered areas of subsegmental atelectasis without acute airspace disease; hepatic steatosis; chronic elevation of right  hemidiaphragm; stable lytic lesions left first and second ribs consistent with history of multiple myeloma; stable hiatal hernia.  CBC showed leukopenia at 2.9; this is likely due to taking lenalidomide (REVLIMID).  This value is  consistent with patient's baseline as he is ranged from 3.6-2.8 over the last year.  Absolute neutrophil count 0.9 again this values consistent with previous as he is ranged from 1.6-0.9 over the last year.  CMP showed potassium slightly lowered at 3.3.  Patient was given K. Dur 34mq; will have patient follow-up with his primary care provider for repeat testing.  CMP also showed AST and ALT elevated at 75 and 80 respectively. Patient's AST and ALT have been slightly elevated over the past year but not to this extent.  It is possible that patient's elevated liver enzymes are due to his use of lenalidomide.  We will have patient follow-up with his primary care provider.  On serial reexamination patient remains in no acute distress, able to speak in full complete sentences without difficulty oxygen saturation 94% or greater on room air.  Will discharge patient with follow-up with PCP.  Patient to complete his Z-Pak.  Patient was prescribed guaifenesin-codeine cough syrup.  Patient advised not to operate heavy machinery or drive while taking this medication as it can cause sedation.  Discussed results, findings, treatment and follow up. Patient advised of return precautions. Patient verbalized understanding and agreed with plan."    Pt smoked from 65yo to 89yo. Pack a day back then. No hx pf asthma.   Overall feel better. Now 02 sat 95-99%. Recent repeat covid test was negative. This time of the year he does get seasonal allergies.   Pt is taking otc anthistamine. He thinks claritin. Also on flonase.  Over the years most of time had fall allergies with some spring. Now he states has year round nasal congestion and pnd.      Review of Systems  HENT: Positive for  congestion, postnasal drip, rhinorrhea, sinus pressure, sinus pain and sneezing. Negative for sore throat.        Rare sneeze.  Respiratory: Negative for choking, chest tightness, shortness of breath and wheezing.   Cardiovascular: Negative for chest pain and palpitations.  Gastrointestinal: Negative for abdominal pain.  Musculoskeletal: Negative for back pain.  Skin: Negative for rash.  Hematological: Negative for adenopathy. Does not bruise/bleed easily.  Psychiatric/Behavioral: Negative for behavioral problems, confusion and sleep disturbance. The patient is not nervous/anxious.     Past Medical History:  Diagnosis Date  . Allergy   . Anxiety   . Blood transfusion without reported diagnosis 2018  . Bone metastasis ([facility]   . Chest cold 05/19/2016   productive cough  -- started on antibiotic  . Chronic back pain    due to bone mets from myeloma  . Cough   . Depression   . Diverticulitis   . GERD (gastroesophageal reflux disease)   . Hiatal hernia   . History of chicken pox   . History of concussion    age 65-- no residual  . History of DVT of lower extremity 03/21/2016  treated and completed w/  xarelto   per doppler left extensive occlusion common femoral, femoral, and popliteal veins and right partial occlusion common femoral and profunda femoral veins/  last doppler 06-04-2016 no evidence acute or chronic dvt noted either leg   . History of radiation therapy 06/09/16-06/23/16   lower thoracic spine 25 Gy in 10 fractions  . Mouth ulcers    secondary to radiation  . Multiple myeloma ([place]) dx 02/22/2016 via bone marrow bx---  oncologist-  [name]   IgG Kappa-- Hyperdiploid/ +11 w/ bone mets--  current treatment chemotherapy (started 08/ 2017)and pallitive radiation to back started 06-09-2016  . Renal calculus, right   . Wears contact lenses      Social History   Socioeconomic History  . Marital status: Married    Spouse name: Not on file  . Number of children: 3  .  Years of education: Not on file  . Highest education level: Not on file  Occupational History  . Not on file  Tobacco Use  . Smoking status: Former Smoker    Packs/day: 1.00    Years: 7.00    Pack years: 7.00    Types: Cigarettes    Quit date: 11/02/1980    Years since quitting: 39.9  . Smokeless tobacco: Never Used  Vaping Use  . Vaping Use: Never used  Substance and Sexual Activity  . Alcohol use: Yes    Comment: occasional  . Drug use: No  . Sexual activity: Never  Other Topics Concern  . Not on file  Social History Narrative  . Not on file   Social Determinants of Health   Financial Resource Strain: Not on file  Food Insecurity: Not on file  Transportation Needs: Not on file  Physical Activity: Not on file  Stress: Not on file  Social Connections: Not on file  Intimate Partner Violence: Not on file    Past Surgical History:  Procedure Laterality Date  . COLONOSCOPY  [id number]  . COLONOSCOPY  01/07/2017   Dr Penelope Coop, [facility]  . CYSTOSCOPY W/ URETERAL STENT PLACEMENT Right 06/20/2016   Procedure: CYSTOSCOPY WITH STENT REPLACEMENT;  Surgeon: Kathie Rhodes, MD;  Location: [facility];  Service: Urology;  Laterality: Right;  . CYSTOSCOPY WITH RETROGRADE PYELOGRAM, URETEROSCOPY AND STENT PLACEMENT Right 05/30/2016   Procedure: CYSTOSCOPY WITH RETROGRADE PYELOGRAM, URETEROSCOPY AND STENT PLACEMENT,DILITATION URETERAL STRICTURE;  Surgeon: Kathie Rhodes, MD;  Location: [facility];  Service: Urology;  Laterality: Right;  . CYSTOSCOPY/RETROGRADE/URETEROSCOPY/STONE EXTRACTION WITH BASKET Right 06/20/2016   Procedure: CYSTOSCOPY/URETEROSCOPY/STONE EXTRACTION WITH BASKET;  Surgeon: Kathie Rhodes, MD;  Location: [facility];  Service: Urology;  Laterality: Right;  . HIATAL HERNIA REPAIR N/A 08/13/2018   Procedure: LAPAROSCOPIC REPAIR OF HIATAL HERNIA REPAIR WITH FUNDOPLICATION AND INSERTION OF MESH;  Surgeon: Excell Seltzer, MD;  Location: [facility];  Service:  General;  Laterality: N/A;  . HOLMIUM LASER APPLICATION Right 74/07/2876   Procedure: HOLMIUM LASER APPLICATION;  Surgeon: Kathie Rhodes, MD;  Location: [facility];  Service: Urology;  Laterality: Right;  . IR GENERIC HISTORICAL  02/11/2016   IR RADIOLOGIST EVAL & MGMT 02/11/2016 [facility]  . IR GENERIC HISTORICAL  02/15/2016   IR BONE TUMOR(S)RF ABLATION 02/15/2016 Luanne Bras, MD [facility]  . IR GENERIC HISTORICAL  02/15/2016   IR BONE TUMOR(S)RF ABLATION 02/15/2016 Luanne Bras, MD [facility]  . IR GENERIC HISTORICAL  02/15/2016   IR BONE TUMOR(S)RF ABLATION 02/15/2016 Luanne Bras, MD [facility]  . IR GENERIC HISTORICAL  02/15/2016  IR KYPHO THORACIC WITH BONE BIOPSY 02/15/2016 Luanne Bras, MD [facility]  . IR GENERIC HISTORICAL  02/15/2016   IR KYPHO THORACIC WITH BONE BIOPSY 02/15/2016 Luanne Bras, MD [facility]  . IR GENERIC HISTORICAL  02/15/2016   IR VERTEBROPLASTY CERV/THOR BX INC UNI/BIL INC/INJECT/IMAGING 02/15/2016 Luanne Bras, MD [facility]  . IR GENERIC HISTORICAL  03/13/2016   IR KYPHO EA ADDL LEVEL THORACIC OR LUMBAR 03/13/2016 Luanne Bras, MD [facility]  . IR GENERIC HISTORICAL  03/13/2016   IR KYPHO EA ADDL LEVEL THORACIC OR LUMBAR 03/13/2016 Luanne Bras, MD [facility]  . IR GENERIC HISTORICAL  03/13/2016   IR BONE TUMOR(S)RF ABLATION 03/13/2016 Luanne Bras, MD [facility]  . IR GENERIC HISTORICAL  03/13/2016   IR KYPHO LUMBAR INC FX REDUCE BONE BX UNI/BIL CANNULATION INC/IMAGING 03/13/2016 Luanne Bras, MD [facility]  . IR GENERIC HISTORICAL  03/13/2016   IR BONE TUMOR(S)RF ABLATION 03/13/2016 Luanne Bras, MD [facility]  . IR GENERIC HISTORICAL  03/13/2016   IR BONE TUMOR(S)RF ABLATION 03/13/2016 Luanne Bras, MD [facility]  . IR GENERIC HISTORICAL  03/31/2016   IR RADIOLOGIST EVAL & MGMT 03/31/2016 [facility]  . KYPHOPLASTY     02/2016, 03/2016, 11/2016  . LAPAROSCOPIC INGUINAL  HERNIA REPAIR Bilateral 12-16-2013  dr gross  . RADIOLOGY WITH ANESTHESIA N/A 02/15/2016   Procedure: Spinal Ablation;  Surgeon: Luanne Bras, MD;  Location: [place];  Service: Radiology;  Laterality: N/A;  . RADIOLOGY WITH ANESTHESIA N/A 03/13/2016   Procedure: LUMBER ABLATION;  Surgeon: Luanne Bras, MD;  Location: [place];  Service: Radiology;  Laterality: N/A;  . ROTATOR CUFF REPAIR Right 2003  . spine operation     x1 in 2018 x3 in 2017  . TONSILLECTOMY  age 24  . VERTEBROPLASTY  01/2016  . WISDOM TOOTH EXTRACTION      Family History  Problem Relation Age of Onset  . Uterine cancer Mother   . Ovarian cancer Mother   . Colon cancer Mother        said it was rectal or colon but not sure  . Rectal cancer Mother   . Heart disease Father   . Hypertension Father   . Multiple sclerosis Sister   . Paranoid behavior Brother   . Drug abuse Brother   . Schizophrenia Brother   . Stroke Maternal Grandfather   . Cancer Maternal Aunt   . Leukemia Paternal Aunt   . Healthy Son        x1  . Healthy Daughter        x2  . Allergies Daughter        x1  . Diabetes Neg Hx   . Alzheimer's disease Neg Hx   . Parkinson's disease Neg Hx   . Esophageal cancer Neg Hx   . Stomach cancer Neg Hx     No Known Allergies  Current Outpatient Medications on File Prior to Visit  Medication Sig Dispense Refill  . acetaminophen (TYLENOL) 325 MG tablet Take 2 tablets (650 mg total) by mouth every 6 (six) hours as needed for mild pain (or Fever >/= 101). (Patient taking differently: Take 325 mg by mouth every 6 (six) hours as needed for mild pain (or Fever >/= 101).)    . albuterol (VENTOLIN HFA) 108 (90 Base) MCG/ACT inhaler TAKE 2 PUFFS BY MOUTH EVERY 6 HOURS AS NEEDED FOR WHEEZE OR SHORTNESS OF BREATH 6.7 each 1  . amLODipine (NORVASC) 10 MG tablet Take 10 mg by mouth  daily.    . amLODipine (NORVASC) 5 MG tablet Take by mouth.    [name] amoxicillin (AMOXIL) 500 MG capsule Take 2 capsules (1,000 mg  total) by mouth See admin instructions. Take 1000 mg 1 hour prior to dental work (Patient not taking: Reported on 09/10/2020) 2 capsule 0  . azithromycin (ZITHROMAX) 250 MG tablet Take 2 tablets by mouth on day 1, followed by 1 tablet by mouth daily for 4 days. 6 tablet 0  . benzonatate (TESSALON) 100 MG capsule Take 1 capsule (100 mg total) by mouth 3 (three) times daily as needed for cough. 30 capsule 0  . cetirizine (ZYRTEC) 10 MG tablet Take 10 mg by mouth daily as needed for allergies.    . Cholecalciferol (VITAMIN D3) 250 MCG (10000 UT) capsule Take 10,000 Units by mouth daily.    . diphenoxylate-atropine (LOMOTIL) 2.5-0.025 MG tablet Take 1 tablet by mouth 4 (four) times daily as needed for diarrhea or loose stools. 30 tablet 0  . famciclovir (FAMVIR) 500 MG tablet Take 500 mg by mouth daily.    . fluorouracil (EFUDEX) 5 % cream APPLY TO AFFECTED AREA TWICE A DAY    . fluticasone (FLONASE) 50 MCG/ACT nasal spray SPRAY 2 SPRAYS INTO EACH NOSTRIL EVERY DAY 16 mL 1  . guaiFENesin-codeine 100-10 MG/5ML syrup Take 10 mLs by mouth every 6 (six) hours as needed for cough. 120 mL 0  . lenalidomide (REVLIMID) 5 MG capsule TAKE 1 CAPSULE BY MOUTH  DAILY FOR 21 DAYS, THEN 7  DAYS OFF Auth# [id number] 21 capsule 0  . lidocaine (LIDODERM) 5 % Place 1 patch onto the skin daily as needed (pain).   2  . lisinopril (ZESTRIL) 40 MG tablet Take 40 mg by mouth daily.    . Loperamide HCl (IMODIUM PO) Take by mouth as needed.    [name] OVER THE COUNTER MEDICATION Take 1 capsule by mouth daily. methylcare otc supplement    . pantoprazole (PROTONIX) 20 MG tablet TAKE 1 TABLET BY MOUTH  DAILY 90 tablet 3  . Probiotic CAPS Take 1 capsule by mouth daily.    . SYMBICORT 160-4.5 MCG/ACT inhaler TAKE 2 PUFFS BY MOUTH TWICE A DAY 10.2 each 0  . tobramycin (TOBREX) 0.3 % ophthalmic solution Place 2 drops into the left eye every 6 (six) hours. 5 mL 0  . Vitamins-Lipotropics (LIPO-FLAVONOID PLUS PO) Take 1 tablet by mouth daily.     Alveda Reasons 10 MG TABS tablet Take 1 tablet (10 mg total) by mouth daily. 90 tablet 3  . zolpidem (AMBIEN) 5 MG tablet Take 1 tablet (5 mg total) by mouth at bedtime. 30 tablet 5   Current Facility-Administered Medications on File Prior to Visit  Medication Dose Route Frequency Provider Last Rate Last Admin  . heparin lock flush 100 unit/mL  500 Units Intravenous Once [place], Holli Humbles, NP      . sodium chloride flush (NS) 0.9 % injection 3 mL  3 mL Intravenous Once PRN [place], Sarah M, NP        BP 136/81   Pulse 66   Temp 98.1 F (36.7 C)   Resp 18   Ht '5\' 7"'  (1.702 m)   Wt 183 lb (83 kg)   SpO2 99%   BMI 28.66 kg/m       Objective:   Physical Exam  General Mental Status- Alert. General Appearance- Not in acute distress.   Skin General: Color- Normal Color. Moisture- Normal Moisture.  Neck Carotid Arteries- Normal color.  Moisture- Normal Moisture. No carotid bruits. No JVD.  Chest and Lung Exam Auscultation: Breath Sounds:-Normal.  Cardiovascular Auscultation:Rythm- Regular. Murmurs & Other Heart Sounds:Auscultation of the heart reveals- No Murmurs.  Abdomen Inspection:-Inspeection Normal. Palpation/Percussion:Note:No mass. Palpation and Percussion of the abdomen reveal- Non Tender, Non Distended + BS, no rebound or guarding.   Neurologic Cranial Nerve exam:- CN III-XII intact(No nystagmus), symmetric smile. Strength:- 5/5 equal and symmetric strength both upper and lower extremities.  heent- nasal congested, left frontal sinus pressure to palpation. No maxillary sinus pressure.    Assessment & Plan:  Alert 65 year old male in no acute distress, nontoxic-appearing.  Patient able to speak in full complete sentences without any difficulty.  Patient oxygenation 94% or greater on room air.  Patient presents with complaint of nonproductive cough, fevers and shortness of breath.  Patient reports that he has had 2 negtive rapid Covid test and 1 negative Covid  PCR test.  Patient states that he was seen by his primary care provider and is on day 3 of azithromycin.  Patient also reports receiving Tessalon which has not relieved his cough symptoms.  Ambulated patient at bedside SPO2 was noted to be 93% on room air after 1 minute of ambulation.    Patient reports that last week he flew multiple times.  Patient also reports that he has a history of DVT.  Patient is currently on 10 mg Xarelto.  Due to patient's history of DVT, recent travel, and shortness of breath will obtain D-dimer.  Due to patient's complaint of cough and shortness of breath will obtain chest x-ray.    Low suspicion for COVID-19 infection as patient has received immunization x2, booster x2, and has multiple documented negative COVID-19 test since his symptoms began.  Chest x-ray shows no consolidation, pleural effusion.  Elevated right hemidiaphragm and mild bibasilar atelectasis noted.  Lungs clear to auscultation bilaterally.  Low suspicion for pneumonia or pulmonary edema.  D-dimer was found to be elevated will order CTA chest to evaluate for pulmonary embolism.  CTA chest showed no evidence of pulmonary embolus; scattered areas of subsegmental atelectasis without acute airspace disease; hepatic steatosis; chronic elevation of right hemidiaphragm; stable lytic lesions left first and second ribs consistent with history of multiple myeloma; stable hiatal hernia.  CBC showed leukopenia at 2.9; this is likely due to taking lenalidomide (REVLIMID).  This value is  consistent with patient's baseline as he is ranged from 3.6-2.8 over the last year.  Absolute neutrophil count 0.9 again this values consistent with previous as he is ranged from 1.6-0.9 over the last year.  CMP showed potassium slightly lowered at 3.3.  Patient was given K. Dur 50mq; will have patient follow-up with his primary care provider for repeat testing.  CMP also showed AST and ALT elevated at 75 and 80  respectively. Patient's AST and ALT have been slightly elevated over the past year but not to this extent.  It is possible that patient's elevated liver enzymes are due to his use of lenalidomide.  We will have patient follow-up with his primary care provider.  On serial reexamination patient remains in no acute distress, able to speak in full complete sentences without difficulty oxygen saturation 94% or greater on room air.  Will discharge patient with follow-up with PCP.  Patient to complete his Z-Pak.  Patient was prescribed guaifenesin-codeine cough syrup.  Patient advised not to operate heavy machinery or drive while taking this medication as it can cause sedation.  Discussed results, findings, treatment and  follow up. Patient advised of return precautions. Patient verbalized understanding and agreed with plan.  Mackie Pai, PA-C

## 2020-10-03 ENCOUNTER — Ambulatory Visit: Payer: Medicare Other | Admitting: Hematology & Oncology

## 2020-10-03 ENCOUNTER — Ambulatory Visit: Payer: Medicare Other

## 2020-10-03 ENCOUNTER — Other Ambulatory Visit: Payer: Medicare Other

## 2020-10-06 ENCOUNTER — Telehealth: Payer: Self-pay | Admitting: Medical

## 2020-10-06 DIAGNOSIS — R059 Cough, unspecified: Secondary | ICD-10-CM

## 2020-10-06 NOTE — Telephone Encounter (Signed)
Referral to pulmonologist placed. 

## 2020-10-10 ENCOUNTER — Other Ambulatory Visit: Payer: Self-pay | Admitting: *Deleted

## 2020-10-10 DIAGNOSIS — C9000 Multiple myeloma not having achieved remission: Secondary | ICD-10-CM

## 2020-10-11 ENCOUNTER — Inpatient Hospital Stay (HOSPITAL_BASED_OUTPATIENT_CLINIC_OR_DEPARTMENT_OTHER): Payer: Medicare Other | Admitting: Hematology & Oncology

## 2020-10-11 ENCOUNTER — Telehealth: Payer: Self-pay | Admitting: *Deleted

## 2020-10-11 ENCOUNTER — Inpatient Hospital Stay: Payer: Medicare Other

## 2020-10-11 ENCOUNTER — Other Ambulatory Visit: Payer: Self-pay | Admitting: *Deleted

## 2020-10-11 ENCOUNTER — Inpatient Hospital Stay: Payer: Medicare Other | Attending: Hematology & Oncology

## 2020-10-11 ENCOUNTER — Other Ambulatory Visit: Payer: Self-pay

## 2020-10-11 ENCOUNTER — Encounter: Payer: Self-pay | Admitting: Hematology & Oncology

## 2020-10-11 VITALS — BP 124/80 | HR 52 | Temp 98.9°F | Resp 19 | Wt 181.0 lb

## 2020-10-11 DIAGNOSIS — Z7901 Long term (current) use of anticoagulants: Secondary | ICD-10-CM | POA: Insufficient documentation

## 2020-10-11 DIAGNOSIS — R197 Diarrhea, unspecified: Secondary | ICD-10-CM | POA: Insufficient documentation

## 2020-10-11 DIAGNOSIS — D500 Iron deficiency anemia secondary to blood loss (chronic): Secondary | ICD-10-CM | POA: Diagnosis not present

## 2020-10-11 DIAGNOSIS — C9000 Multiple myeloma not having achieved remission: Secondary | ICD-10-CM

## 2020-10-11 DIAGNOSIS — Z86718 Personal history of other venous thrombosis and embolism: Secondary | ICD-10-CM | POA: Diagnosis not present

## 2020-10-11 DIAGNOSIS — D509 Iron deficiency anemia, unspecified: Secondary | ICD-10-CM | POA: Insufficient documentation

## 2020-10-11 DIAGNOSIS — C9001 Multiple myeloma in remission: Secondary | ICD-10-CM

## 2020-10-11 LAB — CBC WITH DIFFERENTIAL (CANCER CENTER ONLY)
Abs Immature Granulocytes: 0.01 10*3/uL (ref 0.00–0.07)
Basophils Absolute: 0 10*3/uL (ref 0.0–0.1)
Basophils Relative: 1 %
Eosinophils Absolute: 0.2 10*3/uL (ref 0.0–0.5)
Eosinophils Relative: 6 %
HCT: 44.2 % (ref 39.0–52.0)
Hemoglobin: 15.2 g/dL (ref 13.0–17.0)
Immature Granulocytes: 0 %
Lymphocytes Relative: 45 %
Lymphs Abs: 1.7 10*3/uL (ref 0.7–4.0)
MCH: 34.2 pg — ABNORMAL HIGH (ref 26.0–34.0)
MCHC: 34.4 g/dL (ref 30.0–36.0)
MCV: 99.5 fL (ref 80.0–100.0)
Monocytes Absolute: 0.3 10*3/uL (ref 0.1–1.0)
Monocytes Relative: 9 %
Neutro Abs: 1.5 10*3/uL — ABNORMAL LOW (ref 1.7–7.7)
Neutrophils Relative %: 39 %
Platelet Count: 149 10*3/uL — ABNORMAL LOW (ref 150–400)
RBC: 4.44 MIL/uL (ref 4.22–5.81)
RDW: 11.9 % (ref 11.5–15.5)
WBC Count: 3.9 10*3/uL — ABNORMAL LOW (ref 4.0–10.5)
nRBC: 0 % (ref 0.0–0.2)

## 2020-10-11 LAB — COMPREHENSIVE METABOLIC PANEL
ALT: 43 U/L (ref 0–44)
AST: 38 U/L (ref 15–41)
Albumin: 4.2 g/dL (ref 3.5–5.0)
Alkaline Phosphatase: 56 U/L (ref 38–126)
Anion gap: 7 (ref 5–15)
BUN: 15 mg/dL (ref 8–23)
CO2: 27 mmol/L (ref 22–32)
Calcium: 9.7 mg/dL (ref 8.9–10.3)
Chloride: 103 mmol/L (ref 98–111)
Creatinine, Ser: 0.94 mg/dL (ref 0.61–1.24)
GFR, Estimated: >60 mL/min (ref >60)
Glucose, Bld: 98 mg/dL (ref 70–99)
Potassium: 4.6 mmol/L (ref 3.5–5.1)
Sodium: 137 mmol/L (ref 135–145)
Total Bilirubin: 0.8 mg/dL (ref 0.3–1.2)
Total Protein: 7.1 g/dL (ref 6.5–8.1)

## 2020-10-11 LAB — FERRITIN: Ferritin: 231 ng/mL (ref 24–336)

## 2020-10-11 LAB — LACTATE DEHYDROGENASE: LDH: 168 U/L (ref 98–192)

## 2020-10-11 LAB — IRON AND TIBC
Iron: 122 ug/dL (ref 42–163)
Saturation Ratios: 37 % (ref 20–55)
TIBC: 330 ug/dL (ref 202–409)
UIBC: 208 ug/dL (ref 117–376)

## 2020-10-11 MED ORDER — SODIUM CHLORIDE 0.9 % IV SOLN
Freq: Once | INTRAVENOUS | Status: AC
  Filled 2020-10-11: qty 250

## 2020-10-11 MED ORDER — ZOLEDRONIC ACID 4 MG/100ML IV SOLN
4.0000 mg | Freq: Once | INTRAVENOUS | Status: AC
  Administered 2020-10-11: 4 mg via INTRAVENOUS
  Filled 2020-10-11: qty 100

## 2020-10-11 NOTE — Progress Notes (Signed)
Hematology and Oncology Follow Up Visit  Jorge [name] [id number] 1955/11/12 65 y.o. 10/11/2020   Principle Diagnosis:  IgG Kappa myeloma - Hyperdiploid/+11 DVT of the LEFT and RIGHT leg  Iron deficiency anemia  Current Therapy:   Revlimid 78m po q day (21 on/7 off) - start on 04/07/2018   Zometa 4 mg IV q 3 months- next dose is 12/2020 Xarelto 10 mg PO daily IV Iron as needed   Interim History:  Jorge [name] here today for follow-up.  He recently had an upper respiratory issue.  He has had to go to the emergency room.  There is no pneumonia.  He has had like he may have had some tracheal bronchitis.  He subsequently has gotten better.  He feels better right now.  He is having some diarrhea.  He says Imodium seems to help more than Lomotil.  I will know if the diarrhea might be from the Revlimid.  He says that he might go 1-3 times a day.  He has had no problems with urine.  He has had no hematuria.  His last myeloma studies look fantastic.  There was no monoclonal spike in his blood.  His IgG level was 1000 mg/dL.  His Kappa light chain was 2.9 mg/dL.  He has had no problems with headache.  He has had no bleeding.  Jorge [name] have someprecancerous lesions on his scalp.  He is placing some 5-FU cream on there.  He has another week to do this.  His iron studies back in February showed a ferritin of 141 with an iron saturation of 31%.  Overall, I would say his performance status is ECOG 1.    Medications:  Allergies as of 10/11/2020   No Known Allergies     Medication List       Accurate as of October 11, 2020 10:02 AM. If you have any questions, ask your nurse or doctor.        STOP taking these medications   azithromycin 250 MG tablet Commonly known as: ZITHROMAX Stopped by: [name]   famciclovir 500 MG tablet Commonly known as: FAMVIR Stopped by: [name]   tobramycin 0.3 % ophthalmic solution Commonly known as: Tobrex Stopped by: [name]     TAKE these medications   acetaminophen 325 MG tablet Commonly known as: TYLENOL Take 2 tablets (650 mg total) by mouth every 6 (six) hours as needed for mild pain (or Fever >/= 101). What changed: how much to take   albuterol 108 (90 Base) MCG/ACT inhaler Commonly known as: VENTOLIN HFA TAKE 2 PUFFS BY MOUTH EVERY 6 HOURS AS NEEDED FOR WHEEZE OR SHORTNESS OF BREATH   amLODipine 5 MG tablet Commonly known as: NORVASC Take by mouth. What changed: Another medication with the same name was removed. Continue taking this medication, and follow the directions you see here. Changed by: [name]   amoxicillin 500 MG capsule Commonly known as: AMOXIL Take 2 capsules (1,000 mg total) by mouth See admin instructions. Take 1000 mg 1 hour prior to dental work   azelastine 0.1 % nasal spray Commonly known as: ASTELIN Place 2 sprays into both nostrils 2 (two) times daily. Use in each nostril as directed   benzonatate 100 MG capsule Commonly known as: TESSALON Take 1 capsule (100 mg total) by mouth 3 (three) times daily as needed for cough.   cetirizine 10 MG tablet Commonly known as: ZYRTEC Take 10 mg  by mouth daily as needed for allergies.   diphenoxylate-atropine 2.5-0.025 MG tablet Commonly known as: LOMOTIL Take 1 tablet by mouth 4 (four) times daily as needed for diarrhea or loose stools.   fluorouracil 5 % cream Commonly known as: EFUDEX APPLY TO AFFECTED AREA TWICE A DAY   fluticasone 50 MCG/ACT nasal spray Commonly known as: FLONASE SPRAY 2 SPRAYS INTO EACH NOSTRIL EVERY DAY   guaiFENesin-codeine 100-10 MG/5ML syrup Take 10 mLs by mouth every 6 (six) hours as needed for cough.   IMODIUM PO Take by mouth as needed.   lenalidomide 5 MG capsule Commonly known as: Revlimid TAKE 1 CAPSULE BY MOUTH  DAILY FOR 21 DAYS, THEN 7  DAYS OFF Auth# [id number]   levocetirizine 5 MG tablet Commonly known as: XYZAL Take 1 tablet (5 mg total) by mouth every  evening.   lidocaine 5 % Commonly known as: LIDODERM Place 1 patch onto the skin daily as needed (pain).   LIPO-FLAVONOID PLUS PO Take 1 tablet by mouth daily.   lisinopril 40 MG tablet Commonly known as: ZESTRIL Take 40 mg by mouth daily.   OVER THE COUNTER MEDICATION Take 1 capsule by mouth daily. methylcare otc supplement   pantoprazole 20 MG tablet Commonly known as: PROTONIX TAKE 1 TABLET BY MOUTH  DAILY   Probiotic Caps Take 1 capsule by mouth daily.   Symbicort 160-4.5 MCG/ACT inhaler Generic drug: budesonide-formoterol TAKE 2 PUFFS BY MOUTH TWICE A DAY   Vitamin D3 250 MCG (10000 UT) capsule Take 10,000 Units by mouth daily.   Xarelto 10 MG Tabs tablet Generic drug: rivaroxaban Take 1 tablet (10 mg total) by mouth daily.   zolpidem 5 MG tablet Commonly known as: AMBIEN Take 1 tablet (5 mg total) by mouth at bedtime.       Allergies: No Known Allergies  Past Medical History, Surgical history, Social history, and Family History were reviewed and updated.  Review of Systems: Review of Systems  Constitutional: Negative.   HENT: Negative.   Eyes: Negative.   Respiratory: Negative.   Cardiovascular: Negative.   Gastrointestinal: Negative.   Genitourinary: Negative.   Musculoskeletal: Positive for myalgias.  Skin: Negative.   Neurological: Negative.   Endo/Heme/Allergies: Negative.   Psychiatric/Behavioral: Negative.      Physical Exam:  weight is 181 lb (82.1 kg). His oral temperature is 98.9 F (37.2 C). His blood pressure is 124/80 and his pulse is 52 (abnormal). His respiration is 19 and oxygen saturation is 98%.   Wt Readings from Last 3 Encounters:  10/11/20 181 lb (82.1 kg)  10/02/20 183 lb (83 kg)  09/13/20 189 lb 11.2 oz (86 kg)    Physical Exam Vitals signs reviewed.  HENT:     Head: Normocephalic and atraumatic.  Eyes:     Pupils: Pupils are equal, round, and reactive to light.  Neck:     Musculoskeletal: Normal range of  motion.  Cardiovascular:     Rate and Rhythm: Normal rate and regular rhythm.     Heart sounds: Normal heart sounds.  Pulmonary:     Effort: Pulmonary effort is normal.     Breath sounds: Normal breath sounds.  Abdominal:     General: Bowel sounds are normal.     Palpations: Abdomen is soft.  Musculoskeletal: Normal range of motion.        General: No tenderness or deformity.  Lymphadenopathy:     Cervical: No cervical adenopathy.  Skin:    General: Skin is warm and dry.  Findings: No erythema or rash.  Neurological:     Mental Status: He is alert and oriented to person, place, and time.  Psychiatric:        Behavior: Behavior normal.        Thought Content: Thought content normal.        Judgment: Judgment normal.      Lab Results  Component Value Date   WBC 3.9 (L) 10/11/2020   HGB 15.2 10/11/2020   HCT 44.2 10/11/2020   MCV 99.5 10/11/2020   PLT 149 (L) 10/11/2020   Lab Results  Component Value Date   FERRITIN 141 09/04/2020   IRON 111 09/04/2020   TIBC 358 09/04/2020   UIBC 247 09/04/2020   IRONPCTSAT 31 09/04/2020   Lab Results  Component Value Date   RETICCTPCT 2.0 12/23/2019   RBC 4.44 10/11/2020   Lab Results  Component Value Date   KPAFRELGTCHN 28.7 (H) 09/04/2020   LAMBDASER 14.5 09/04/2020   KAPLAMBRATIO 1.98 (H) 09/04/2020   Lab Results  Component Value Date   IGGSERUM 1,003 09/04/2020   IGA 334 09/04/2020   IGMSERUM 27 09/04/2020   Lab Results  Component Value Date   TOTALPROTELP 6.9 09/04/2020   ALBUMINELP 4.0 09/04/2020   A1GS 0.2 09/04/2020   A2GS 0.5 09/04/2020   BETS 1.0 09/04/2020   GAMS 1.2 09/04/2020   MSPIKE Not Observed 09/04/2020   SPEI Comment 01/05/2018     Chemistry      Component Value Date/Time   NA 137 10/11/2020 0904   NA 143 06/30/2017 1049   NA 140 10/03/2016 0845   K 4.6 10/11/2020 0904   K 4.0 06/30/2017 1049   K 4.3 10/03/2016 0845   CL 103 10/11/2020 0904   CL 105 06/30/2017 1049   CO2 27  10/11/2020 0904   CO2 27 06/30/2017 1049   CO2 26 10/03/2016 0845   BUN 15 10/11/2020 0904   BUN 13 06/30/2017 1049   BUN 11.9 10/03/2016 0845   CREATININE 0.94 10/11/2020 0904   CREATININE 1.03 09/04/2020 1146   CREATININE 1.0 06/30/2017 1049   CREATININE 0.9 10/03/2016 0845      Component Value Date/Time   CALCIUM 9.7 10/11/2020 0904   CALCIUM 9.0 06/30/2017 1049   CALCIUM 9.9 10/03/2016 0845   ALKPHOS 56 10/11/2020 0904   ALKPHOS 57 06/30/2017 1049   ALKPHOS 80 10/03/2016 0845   AST 38 10/11/2020 0904   AST 41 09/04/2020 1146   AST 28 10/03/2016 0845   ALT 43 10/11/2020 0904   ALT 49 (H) 09/04/2020 1146   ALT 43 06/30/2017 1049   ALT 26 10/03/2016 0845   BILITOT 0.8 10/11/2020 0904   BILITOT 0.7 09/04/2020 1146   BILITOT 0.48 10/03/2016 0845      Impression and Plan: Mr. Sobecki is a pleasant 65 yo gentleman with IgG kappa myeloma.  He ultimately underwent an autologous stem cell transplant at [facility] in February 2018.   I am just happy that everything is going well for him right now.  He really has done quite nicely.  I hate that he has this diarrhea issue.  I am not sure what else we try to help him with this.  We will plan to get her back to see [place] in another 3 months.  He was gets his Zometa when he comes in to see [place].   [name], MD 3/31/202210:02 AM

## 2020-10-11 NOTE — Telephone Encounter (Signed)
Per los 10/11/20 called and lvm of upcoming appointments - mailed calendar - view mychart

## 2020-10-11 NOTE — Progress Notes (Signed)
Evusheld referral placed per dR Ennever request

## 2020-10-12 LAB — KAPPA/LAMBDA LIGHT CHAINS
Kappa free light chain: 30.7 mg/L — ABNORMAL HIGH (ref 3.3–19.4)
Kappa, lambda light chain ratio: 1.21 (ref 0.26–1.65)
Lambda free light chains: 25.3 mg/L (ref 5.7–26.3)

## 2020-10-12 LAB — IGG, IGA, IGM
IgA: 407 mg/dL (ref 61–437)
IgG (Immunoglobin G), Serum: 1185 mg/dL (ref 603–1613)
IgM (Immunoglobulin M), Srm: 32 mg/dL (ref 20–172)

## 2020-10-15 ENCOUNTER — Encounter: Payer: Self-pay | Admitting: Adult Health

## 2020-10-15 ENCOUNTER — Other Ambulatory Visit: Payer: Self-pay | Admitting: Adult Health

## 2020-10-15 DIAGNOSIS — C9000 Multiple myeloma not having achieved remission: Secondary | ICD-10-CM

## 2020-10-15 LAB — PROTEIN ELECTROPHORESIS, SERUM, WITH REFLEX
A/G Ratio: 1.2 (ref 0.7–1.7)
Albumin ELP: 3.8 g/dL (ref 2.9–4.4)
Alpha-1-Globulin: 0.3 g/dL (ref 0.0–0.4)
Alpha-2-Globulin: 0.6 g/dL (ref 0.4–1.0)
Beta Globulin: 1.1 g/dL (ref 0.7–1.3)
Gamma Globulin: 1.2 g/dL (ref 0.4–1.8)
Globulin, Total: 3.3 g/dL (ref 2.2–3.9)
Total Protein ELP: 7.1 g/dL (ref 6.0–8.5)

## 2020-10-15 NOTE — Progress Notes (Signed)
I connected by phone with Jorge [name] on 10/15/2020, 12:04 PM to discuss the potential use of a new treatment, tixagevimab/cilgavimab, for pre-exposure prophylaxis for prevention of coronavirus disease 2019 (COVID-19) caused by the SARS-CoV-2 virus.  This patient is a 65 y.o. male that meets the FDA criteria for Emergency Use Authorization of tixagevimab/cilgavimab for pre-exposure prophylaxis of COVID-19 disease. Pt meets following criteria:  Age >12 yr and weight > 40kg  Not currently infected with SARS-CoV-2 and has no known recent exposure to an individual infected with SARS-CoV-2 AND o Who has moderate to severe immune compromise due to a medical condition or receipt of immunosuppressive medications or treatments and may not mount an adequate immune response to COVID-19 vaccination or  o Vaccination with any available COVID-19 vaccine, according to the approved or authorized schedule, is not recommended due to a history of severe adverse reaction (e.g., severe allergic reaction) to a COVID-19 vaccine(s) and/or COVID-19 vaccine component(s).  o Patient meets the following definition of mod-severe immune compromised status: 4. Lung transplants, other solid organ transplant recipients on continual belatacept therapy, or multiple myeloma (actively receiving treatment)  I have spoken and communicated the following to the patient or parent/caregiver regarding COVID monoclonal antibody treatment:  1. FDA has authorized the emergency use of tixagevimab/cilgavimab for the pre-exposure prophylaxis of COVID-19 in patients with moderate-severe immunocompromised status, who meet above EUA criteria.  2. The significant known and potential risks and benefits of COVID monoclonal antibody, and the extent to which such potential risks and benefits are unknown.  3. Information on available alternative treatments and the risks and benefits of those alternatives, including clinical trials.  4. The patient or  parent/caregiver has the option to accept or refuse COVID monoclonal antibody treatment.  After reviewing this information with the patient, agree to receive tixagevimab/cilgavimab.  Sent high priority schedule message for patient to come in on 4/12 at 2pm per patient.    Scot Dock, NP, 10/15/2020, 12:04 PM

## 2020-10-23 ENCOUNTER — Inpatient Hospital Stay: Payer: Medicare Other | Attending: Hematology & Oncology

## 2020-10-23 ENCOUNTER — Other Ambulatory Visit: Payer: Self-pay

## 2020-10-23 DIAGNOSIS — C9000 Multiple myeloma not having achieved remission: Secondary | ICD-10-CM | POA: Insufficient documentation

## 2020-10-23 DIAGNOSIS — Z298 Encounter for other specified prophylactic measures: Secondary | ICD-10-CM | POA: Diagnosis not present

## 2020-10-23 MED ORDER — CILGAVIMAB (PART OF EVUSHELD) INJECTION
300.0000 mg | Freq: Once | INTRAMUSCULAR | Status: AC
  Administered 2020-10-23: 300 mg via INTRAMUSCULAR
  Filled 2020-10-23: qty 3

## 2020-10-23 MED ORDER — TIXAGEVIMAB (PART OF EVUSHELD) INJECTION
300.0000 mg | Freq: Once | INTRAMUSCULAR | Status: AC
  Administered 2020-10-23: 300 mg via INTRAMUSCULAR
  Filled 2020-10-23: qty 3

## 2020-10-29 ENCOUNTER — Other Ambulatory Visit: Payer: Self-pay | Admitting: Medical

## 2020-11-15 ENCOUNTER — Encounter: Payer: Self-pay | Admitting: Emergency Medicine

## 2020-11-15 ENCOUNTER — Ambulatory Visit (INDEPENDENT_AMBULATORY_CARE_PROVIDER_SITE_OTHER): Payer: Medicare Other | Admitting: Emergency Medicine

## 2020-11-15 ENCOUNTER — Other Ambulatory Visit: Payer: Self-pay

## 2020-11-15 VITALS — BP 118/80 | HR 77 | Temp 97.3°F | Ht 67.0 in | Wt 187.4 lb

## 2020-11-15 DIAGNOSIS — R053 Chronic cough: Secondary | ICD-10-CM | POA: Insufficient documentation

## 2020-11-15 DIAGNOSIS — R911 Solitary pulmonary nodule: Secondary | ICD-10-CM | POA: Diagnosis not present

## 2020-11-15 MED ORDER — VALSARTAN 160 MG PO TABS: 160.0000 mg | ORAL_TABLET | Freq: Every day | ORAL | 3 refills | Status: DC

## 2020-11-15 MED ORDER — GUAIFENESIN-CODEINE 100-10 MG/5ML PO SOLN: 10.0000 mL | Freq: Four times a day (QID) | ORAL | 0 refills | Status: DC | PRN

## 2020-11-15 NOTE — Patient Instructions (Addendum)
Please stop lisinopril. Please start valsartan 160 mg once daily.  We will give you a prescription for this. Continue your Xyzal once daily Restart fluticasone nasal spray, 2 sprays each nostril once daily. Keep your azelastine nasal spray available to use 2 sprays each nostril up to 3 times daily when you need it for congestion and drainage Try to avoid throat clearing if at all possible.  It may help you to use a sugar-free candy or not methylated cough drop.  When you have the urge to clear your throat, just swallow. Use your cough syrup 5 to 10 cc up to every 6 hours if needed for suppression of your cough. If your cough continues in 1 week (after doing all of the above) then go back on your pantoprazole 40 mg once daily.  Take this medication 1 hour around food. Follow with Dr Lamonte Sakai in 1 month or next available.  If your cough persists then we will talk about other evaluation

## 2020-11-15 NOTE — Assessment & Plan Note (Signed)
Upper airway nature based on his description.  He has significant throat clearing.  We will try to treat the potential contributors, stop his ACE inhibitor, suppress his cough.  If he continues to have coughing then we may need to reimage, consider bronchoscopy  Please stop lisinopril. Please start valsartan 160 mg once daily.  We will give you a prescription for this. Continue your Xyzal once daily Restart fluticasone nasal spray, 2 sprays each nostril once daily. Keep your azelastine nasal spray available to use 2 sprays each nostril up to 3 times daily when you need it for congestion and drainage Try to avoid throat clearing if at all possible.  It may help you to use a sugar-free candy or not methylated cough drop.  When you have the urge to clear your throat, just swallow. Use your cough syrup 5 to 10 cc up to every 6 hours if needed for suppression of your cough. If your cough continues in 1 week (after doing all of the above) then go back on your pantoprazole 40 mg once daily.  Take this medication 1 hour around food. Follow with Dr Lamonte Sakai in 1 month or next available.  If your cough persists then we will talk about other evaluation

## 2020-11-15 NOTE — Progress Notes (Signed)
Subjective:    Patient ID: Jorge [name], male    DOB: Aug 18, 1955, 65 y.o.   MRN: [id number]  HPI 65 yo man, former tobacco (7 pk-yrs), History of multiple myeloma on therapy w transplant 2018, allergic rhinitis, GERD with a hiatal hernia, lower extremity DVT, chronic back pain.  He is referred today for evaluation of cough.  Started to become very persistent about 2 yrs ago. Brought on by URI's. Has PND, still has some but less on astelin NS, xyzal, currently off flonase. No GERD sx. He has been on lisinopril for over a year  CXR 09/13/20, elevated R HD, some R basilar atx  Ct chest 09/13/2020 reviewed by me shows no pulmonary embolism, chronically elevated right hemidiaphragm with some associated right basilar atelectasis, scattered areas of subsegmental atelectasis versus infiltrate   Review of Systems As per HPI  Past Medical History:  Diagnosis Date  . Allergy   . Anxiety   . Blood transfusion without reported diagnosis 2018  . Bone metastasis ([place])   . Chest cold 05/19/2016   productive cough  -- started on antibiotic  . Chronic back pain    due to bone mets from myeloma  . Cough   . Depression   . Diverticulitis   . GERD (gastroesophageal reflux disease)   . Hiatal hernia   . History of chicken pox   . History of concussion    age 12 -- no residual  . History of DVT of lower extremity 03/21/2016  treated and completed w/ xarelto   per doppler left extensive occlusion common femoral, femoral, and popliteal veins and right partial occlusion common femoral and profunda femoral veins/  last doppler 06-04-2016 no evidence acute or chronic dvt noted either leg   . History of radiation therapy 06/09/16-06/23/16   lower thoracic spine 25 Gy in 10 fractions  . Mouth ulcers    secondary to radiation  . Multiple myeloma ([place]) dx 02/22/2016 via bone marrow bx---  oncologist-  [name]   IgG Kappa-- Hyperdiploid/ +11 w/ bone mets--  current treatment chemotherapy (started 08/  2017)and pallitive radiation to back started 06-09-2016  . Renal calculus, right   . Wears contact lenses      Family History  Problem Relation Age of Onset  . Uterine cancer Mother   . Ovarian cancer Mother   . Colon cancer Mother        said it was rectal or colon but not sure  . Rectal cancer Mother   . Heart disease Father   . Hypertension Father   . Multiple sclerosis Sister   . Paranoid behavior Brother   . Drug abuse Brother   . Schizophrenia Brother   . Stroke Maternal Grandfather   . Cancer Maternal Aunt   . Leukemia Paternal Aunt   . Healthy Son        x1  . Healthy Daughter        x2  . Allergies Daughter        x1  . Diabetes Neg Hx   . Alzheimer's disease Neg Hx   . Parkinson's disease Neg Hx   . Esophageal cancer Neg Hx   . Stomach cancer Neg Hx      Social History   Socioeconomic History  . Marital status: Married    Spouse name: Not on file  . Number of children: 3  . Years of education: Not on file  . Highest education level: Not on file  Occupational History  .  Not on file  Tobacco Use  . Smoking status: Former Smoker    Packs/day: 1.00    Years: 7.00    Pack years: 7.00    Types: Cigarettes    Quit date: 11/02/1980    Years since quitting: 40.0  . Smokeless tobacco: Never Used  Vaping Use  . Vaping Use: Never used  Substance and Sexual Activity  . Alcohol use: Yes    Comment: occasional  . Drug use: No  . Sexual activity: Never  Other Topics Concern  . Not on file  Social History Narrative  . Not on file   Social Determinants of Health   Financial Resource Strain: Not on file  Food Insecurity: Not on file  Transportation Needs: Not on file  Physical Activity: Not on file  Stress: Not on file  Social Connections: Not on file  Intimate Partner Violence: Not on file    From [place], [place], [place], [place] No military Some exposure to wood dusts and chemicals through work.   No Known Allergies   Outpatient Medications Prior to Visit   Medication Sig Dispense Refill  . acetaminophen (TYLENOL) 325 MG tablet Take 2 tablets (650 mg total) by mouth every 6 (six) hours as needed for mild pain (or Fever >/= 101). (Patient taking differently: Take 325 mg by mouth every 6 (six) hours as needed for mild pain (or Fever >/= 101).)    . albuterol (VENTOLIN HFA) 108 (90 Base) MCG/ACT inhaler TAKE 2 PUFFS BY MOUTH EVERY 6 HOURS AS NEEDED FOR WHEEZE OR SHORTNESS OF BREATH 6.7 each 1  . amLODipine (NORVASC) 5 MG tablet Take by mouth.    [name] amoxicillin (AMOXIL) 500 MG capsule Take 2 capsules (1,000 mg total) by mouth See admin instructions. Take 1000 mg 1 hour prior to dental work 2 capsule 0  . azelastine (ASTELIN) 0.1 % nasal spray Place 2 sprays into both nostrils 2 (two) times daily. Use in each nostril as directed 30 mL 2  . cetirizine (ZYRTEC) 10 MG tablet Take 10 mg by mouth daily as needed for allergies.    . Cholecalciferol (VITAMIN D3) 250 MCG (10000 UT) capsule Take 10,000 Units by mouth daily.    . diphenoxylate-atropine (LOMOTIL) 2.5-0.025 MG tablet Take 1 tablet by mouth 4 (four) times daily as needed for diarrhea or loose stools. 30 tablet 0  . fluorouracil (EFUDEX) 5 % cream APPLY TO AFFECTED AREA TWICE A DAY    . fluticasone (FLONASE) 50 MCG/ACT nasal spray SPRAY 2 SPRAYS INTO EACH NOSTRIL EVERY DAY 16 mL 1  . lenalidomide (REVLIMID) 5 MG capsule TAKE 1 CAPSULE BY MOUTH  DAILY FOR 21 DAYS, THEN 7  DAYS OFF Auth# [id number] 21 capsule 0  . levocetirizine (XYZAL) 5 MG tablet Take 1 tablet (5 mg total) by mouth every evening. 30 tablet 3  . lidocaine (LIDODERM) 5 % Place 1 patch onto the skin daily as needed (pain).   2  . Loperamide HCl (IMODIUM PO) Take by mouth as needed.    [name] OVER THE COUNTER MEDICATION Take 1 capsule by mouth daily. methylcare otc supplement    . Probiotic CAPS Take 1 capsule by mouth daily.    . SYMBICORT 160-4.5 MCG/ACT inhaler TAKE 2 PUFFS BY MOUTH TWICE A DAY 10.2 each 0  . Vitamins-Lipotropics  (LIPO-FLAVONOID PLUS PO) Take 1 tablet by mouth daily.    Alveda Reasons 10 MG TABS tablet Take 1 tablet (10 mg total) by mouth daily. 90 tablet 3  . zolpidem (  AMBIEN) 5 MG tablet Take 1 tablet (5 mg total) by mouth at bedtime. 30 tablet 5  . guaiFENesin-codeine 100-10 MG/5ML syrup Take 10 mLs by mouth every 6 (six) hours as needed for cough. 120 mL 0  . lisinopril (ZESTRIL) 40 MG tablet Take 40 mg by mouth daily.    . benzonatate (TESSALON) 100 MG capsule Take 1 capsule (100 mg total) by mouth 3 (three) times daily as needed for cough. (Patient not taking: Reported on 11/15/2020) 30 capsule 0  . pantoprazole (PROTONIX) 20 MG tablet TAKE 1 TABLET BY MOUTH  DAILY (Patient not taking: Reported on 11/15/2020) 90 tablet 3   Facility-Administered Medications Prior to Visit  Medication Dose Route Frequency Provider Last Rate Last Admin  . heparin lock flush 100 unit/mL  500 Units Intravenous Once [place], Sarah M, NP      . sodium chloride flush (NS) 0.9 % injection 3 mL  3 mL Intravenous Once PRN [place], Holli Humbles, NP            Objective:   Physical Exam  Vitals:   11/15/20 1600  BP: 118/80  Pulse: 77  Temp: (!) 97.3 F (36.3 C)  TempSrc: Temporal  SpO2: 97%  Weight: 187 lb 6.4 oz (85 kg)  Height: '5\' 7"'  (1.702 m)   Gen: Pleasant, well-nourished, in no distress,  normal affect  ENT: No lesions,  mouth clear,  oropharynx clear, no postnasal drip  Neck: No JVD, no stridor  Lungs: No use of accessory muscles, no crackles or wheezing on normal respiration, no wheeze on forced expiration  Cardiovascular: RRR, heart sounds normal, no murmur or gallops, no peripheral edema  Musculoskeletal: No deformities, no cyanosis or clubbing  Neuro: alert, awake, non focal  Skin: Warm, no lesions or rash     Assessment & Plan:  Chronic cough Upper airway nature based on his description.  He has significant throat clearing.  We will try to treat the potential contributors, stop his ACE inhibitor,  suppress his cough.  If he continues to have coughing then we may need to reimage, consider bronchoscopy  Please stop lisinopril. Please start valsartan 160 mg once daily.  We will give you a prescription for this. Continue your Xyzal once daily Restart fluticasone nasal spray, 2 sprays each nostril once daily. Keep your azelastine nasal spray available to use 2 sprays each nostril up to 3 times daily when you need it for congestion and drainage Try to avoid throat clearing if at all possible.  It may help you to use a sugar-free candy or not methylated cough drop.  When you have the urge to clear your throat, just swallow. Use your cough syrup 5 to 10 cc up to every 6 hours if needed for suppression of your cough. If your cough continues in 1 week (after doing all of the above) then go back on your pantoprazole 40 mg once daily.  Take this medication 1 hour around food. Follow with Dr Lamonte Sakai in 1 month or next available.  If your cough persists then we will talk about other evaluation   Baltazar Apo, MD, PhD 11/15/2020, 5:19 PM [facility] [phone] or if no answer before 7:00PM call [phone] For any issues after 7:00PM please call eLink [phone]

## 2020-11-16 ENCOUNTER — Other Ambulatory Visit: Payer: Self-pay | Admitting: Hematology & Oncology

## 2020-11-16 ENCOUNTER — Other Ambulatory Visit: Payer: Self-pay

## 2020-11-16 DIAGNOSIS — C9000 Multiple myeloma not having achieved remission: Secondary | ICD-10-CM

## 2020-11-16 MED ORDER — LENALIDOMIDE 5 MG PO CAPS: ORAL_CAPSULE | ORAL | 0 refills | Status: DC

## 2020-12-13 ENCOUNTER — Other Ambulatory Visit: Payer: Self-pay | Admitting: Hematology & Oncology

## 2020-12-13 ENCOUNTER — Other Ambulatory Visit: Payer: Self-pay | Admitting: *Deleted

## 2020-12-13 DIAGNOSIS — C9000 Multiple myeloma not having achieved remission: Secondary | ICD-10-CM

## 2020-12-13 MED ORDER — LENALIDOMIDE 5 MG PO CAPS: ORAL_CAPSULE | ORAL | 0 refills | Status: DC

## 2020-12-17 ENCOUNTER — Telehealth: Payer: Self-pay | Admitting: *Deleted

## 2020-12-17 NOTE — Telephone Encounter (Signed)
Patient called and stated,"I tested positive for Covid-19 this morning. My wife had it all last week. I tested negative all last week but this morning it came back positive. What do I need to do?" I instructed him he needs to call his employer and he needs to call his PCP. He verbalized understanding.

## 2020-12-18 ENCOUNTER — Telehealth (INDEPENDENT_AMBULATORY_CARE_PROVIDER_SITE_OTHER): Payer: Medicare Other | Admitting: Medical

## 2020-12-18 ENCOUNTER — Telehealth: Payer: Self-pay | Admitting: Medical

## 2020-12-18 ENCOUNTER — Other Ambulatory Visit: Payer: Self-pay

## 2020-12-18 VITALS — HR 78 | Temp 97.3°F

## 2020-12-18 DIAGNOSIS — U071 COVID-19: Secondary | ICD-10-CM | POA: Diagnosis not present

## 2020-12-18 DIAGNOSIS — R059 Cough, unspecified: Secondary | ICD-10-CM

## 2020-12-18 MED ORDER — BENZONATATE 100 MG PO CAPS: 100.0000 mg | ORAL_CAPSULE | Freq: Three times a day (TID) | ORAL | 0 refills | Status: DC | PRN

## 2020-12-18 MED ORDER — MOLNUPIRAVIR 200 MG PO CAPS: 800.0000 mg | ORAL_CAPSULE | Freq: Two times a day (BID) | ORAL | 0 refills | Status: DC

## 2020-12-18 MED ORDER — AZITHROMYCIN 250 MG PO TABS: ORAL_TABLET | ORAL | 0 refills | Status: AC

## 2020-12-18 NOTE — Telephone Encounter (Signed)
Will you check and make sure the monclonal antibody treatment order went thru.

## 2020-12-18 NOTE — Patient Instructions (Signed)
History of recent COVID infection diagnosed yesterday with positive COVID test.  Symptomatic since Saturday.  Presently milder symptoms but patient does have history of multiple myeloma.  Has been vaccinated against COVID in the past.  Continue vitamin D and zinc.  Decided to go ahead and prescribe azithromycin antibiotic and benzonatate for productive cough.  Considered use Paxlovid but patient is using Xarelto.  Discussed with pharmacist about this epic warning and she described it as significant/contraindication.   Also discussed case with nurse practitioner at infusion center since decided on Molnupiravir but not as effective as paxlovid.  So sent in monupiravir and placed order order for antibody infusion.  Follow-up in 1 week or as needed.

## 2020-12-18 NOTE — Telephone Encounter (Signed)
Patient is scheduled for infusion on Wednesday, 12/19/2020.

## 2020-12-18 NOTE — Telephone Encounter (Signed)
Ok thanks 

## 2020-12-18 NOTE — Progress Notes (Addendum)
Subjective:    Jorge [name] ID: Jorge [name], male    DOB: August 12, 1955, 65 y.o.   MRN: [id number]  HPI  Virtual Visit via Video Note  I connected with Jorge [name] on 12/18/20 at  9:00 AM EDT by a video enabled telemedicine application and verified that I am speaking with the correct person using two identifiers.  Location: Jorge [name]: home Provider:office  Participants- pt and myself.     I discussed the limitations of evaluation and management by telemedicine and the availability of in person appointments. The Jorge [name] expressed understanding and agreed to proceed.  History of Present Illness:   Pt states tested + for covid with rapid and tested + with rapid test at pharmacy. Pt wife had symptoms last week. Pt over weekend had sniffling and mild dry cough. He thought he had allergies. Pt tested negative consecutive days but yesterday was positive. pcr test negative last Thursday.  Pt got evu shield. Monocoloncal antibody. This was done prior to traveling at Dr. Marin Olp office gave treatment. He got that on 10-23-2020.  Pt has normal gfr greater than 50. Pt is on xarelto and significant interaction/contraindication with paxlovid.  Pt can take molnupiravi and . Monoclonal antibody treatments. Discussed tx protocols with NP Wilber Bihari who works in infusion center.   Observations/Objective:  General-no acute distress, pleasant, oriented. Lungs- on inspection lungs appear unlabored. Neck- no tracheal deviation or jvd on inspection. Neuro- gross motor function appears intact.  Assessment and Plan: History of recent COVID infection diagnosed yesterday with positive COVID test.  Symptomatic since Saturday.  Presently milder symptoms but Jorge [name] does have history of multiple myeloma.  Has been vaccinated against COVID in the past.  Continue vitamin D and zinc.  Decided to go ahead and prescribe azithromycin antibiotic and benzonatate for productive cough.  Considered use Paxlovid  but Jorge [name] is using Xarelto.  Discussed with pharmacist about this epic warning and she described it as significant/contraindication.   Also discussed case with nurse practitioner at infusion center since decided on Molnupiravir but not as effective as paxlovid.  So sent in monupiravir and placed order order for antibody infusion.  Follow-up in 1 week or as needed.  Follow Up Instructions:    I discussed the assessment and treatment plan with the Jorge [name]. The Jorge [name] was provided an opportunity to ask questions and all were answered. The Jorge [name] agreed with the plan and demonstrated an understanding of the instructions.   The Jorge [name] was advised to call back or seek an in-person evaluation if the symptoms worsen or if the condition fails to improve as anticipated.  Time spent with Jorge [name] today was 41 minutes which consisted of chart review, discussing diagnosis, work up, treatment and documentation.   Mackie Pai, PA-C   Review of Systems  Constitutional: Negative for chills, fatigue and fever.  HENT: Positive for congestion, postnasal drip and rhinorrhea. Negative for sinus pressure and sinus pain.   Respiratory: Positive for cough. Negative for choking, shortness of breath and wheezing.   Cardiovascular: Negative for chest pain and palpitations.  Gastrointestinal: Negative for abdominal pain.  Musculoskeletal: Negative for back pain and joint swelling.  Skin: Negative for rash.  Neurological: Negative for dizziness, seizures, syncope, weakness and headaches.  Hematological: Negative for adenopathy. Does not bruise/bleed easily.  Psychiatric/Behavioral: Negative for behavioral problems.    Past Medical History:  Diagnosis Date  . Allergy   . Anxiety   . Blood transfusion without reported diagnosis 2018  . Bone metastasis ([place])   .  Chest cold 05/19/2016   productive cough  -- started on antibiotic  . Chronic back pain    due to bone mets from myeloma  . Cough   .  Depression   . Diverticulitis   . GERD (gastroesophageal reflux disease)   . Hiatal hernia   . History of chicken pox   . History of concussion    age 66 -- no residual  . History of DVT of lower extremity 03/21/2016  treated and completed w/ xarelto   per doppler left extensive occlusion common femoral, femoral, and popliteal veins and right partial occlusion common femoral and profunda femoral veins/  last doppler 06-04-2016 no evidence acute or chronic dvt noted either leg   . History of radiation therapy 06/09/16-06/23/16   lower thoracic spine 25 Gy in 10 fractions  . Mouth ulcers    secondary to radiation  . Multiple myeloma ([place]) dx 02/22/2016 via bone marrow bx---  oncologist-  [name]   IgG Kappa-- Hyperdiploid/ +11 w/ bone mets--  current treatment chemotherapy (started 08/ 2017)and pallitive radiation to back started 06-09-2016  . Renal calculus, right   . Wears contact lenses      Social History   Socioeconomic History  . Marital status: Married    Spouse name: Not on file  . Number of children: 3  . Years of education: Not on file  . Highest education level: Not on file  Occupational History  . Not on file  Tobacco Use  . Smoking status: Former Smoker    Packs/day: 1.00    Years: 7.00    Pack years: 7.00    Types: Cigarettes    Quit date: 11/02/1980    Years since quitting: 40.1  . Smokeless tobacco: Never Used  Vaping Use  . Vaping Use: Never used  Substance and Sexual Activity  . Alcohol use: Yes    Comment: occasional  . Drug use: No  . Sexual activity: Never  Other Topics Concern  . Not on file  Social History Narrative  . Not on file   Social Determinants of Health   Financial Resource Strain: Not on file  Food Insecurity: Not on file  Transportation Needs: Not on file  Physical Activity: Not on file  Stress: Not on file  Social Connections: Not on file  Intimate Partner Violence: Not on file    Past Surgical History:  Procedure  Laterality Date  . COLONOSCOPY  [id number]  . COLONOSCOPY  01/07/2017   Dr Penelope Coop, [facility]  . CYSTOSCOPY W/ URETERAL STENT PLACEMENT Right 06/20/2016   Procedure: CYSTOSCOPY WITH STENT REPLACEMENT;  Surgeon: Kathie Rhodes, MD;  [facility]: [facility];  Service: Urology;  Laterality: Right;  . CYSTOSCOPY WITH RETROGRADE PYELOGRAM, URETEROSCOPY AND STENT PLACEMENT Right 05/30/2016   Procedure: CYSTOSCOPY WITH RETROGRADE PYELOGRAM, URETEROSCOPY AND STENT PLACEMENT,DILITATION URETERAL STRICTURE;  Surgeon: Kathie Rhodes, MD;  Location: [facility];  Service: Urology;  Laterality: Right;  . CYSTOSCOPY/RETROGRADE/URETEROSCOPY/STONE EXTRACTION WITH BASKET Right 06/20/2016   Procedure: CYSTOSCOPY/URETEROSCOPY/STONE EXTRACTION WITH BASKET;  Surgeon: Kathie Rhodes, MD;  Location: [facility];  Service: Urology;  Laterality: Right;  . HIATAL HERNIA REPAIR N/A 08/13/2018   Procedure: LAPAROSCOPIC REPAIR OF HIATAL HERNIA REPAIR WITH FUNDOPLICATION AND INSERTION OF MESH;  Surgeon: Excell Seltzer, MD;  Location: [facility];  Service: General;  Laterality: N/A;  . HOLMIUM LASER APPLICATION Right 88/08/8001   Procedure: HOLMIUM LASER APPLICATION;  Surgeon: Kathie Rhodes, MD;  Location: [facility];  Service: Urology;  Laterality: Right;  . IR GENERIC HISTORICAL  02/11/2016   IR RADIOLOGIST EVAL & MGMT 02/11/2016 [facility]  . IR GENERIC HISTORICAL  02/15/2016   IR BONE TUMOR(S)RF ABLATION 02/15/2016 Luanne Bras, MD [facility]  . IR GENERIC HISTORICAL  02/15/2016   IR BONE TUMOR(S)RF ABLATION 02/15/2016 Luanne Bras, MD [facility]  . IR GENERIC HISTORICAL  02/15/2016   IR BONE TUMOR(S)RF ABLATION 02/15/2016 Luanne Bras, MD [facility]  . IR GENERIC HISTORICAL  02/15/2016   IR KYPHO THORACIC WITH BONE BIOPSY 02/15/2016 Luanne Bras, MD [facility]  . IR GENERIC HISTORICAL  02/15/2016   IR KYPHO THORACIC WITH BONE BIOPSY 02/15/2016 Luanne Bras, MD [facility]   . IR GENERIC HISTORICAL  02/15/2016   IR VERTEBROPLASTY CERV/THOR BX INC UNI/BIL INC/INJECT/IMAGING 02/15/2016 Luanne Bras, MD [facility]  . IR GENERIC HISTORICAL  03/13/2016   IR KYPHO EA ADDL LEVEL THORACIC OR LUMBAR 03/13/2016 Luanne Bras, MD [facility]  . IR GENERIC HISTORICAL  03/13/2016   IR KYPHO EA ADDL LEVEL THORACIC OR LUMBAR 03/13/2016 Luanne Bras, MD [facility]  . IR GENERIC HISTORICAL  03/13/2016   IR BONE TUMOR(S)RF ABLATION 03/13/2016 Luanne Bras, MD [facility]  . IR GENERIC HISTORICAL  03/13/2016   IR KYPHO LUMBAR INC FX REDUCE BONE BX UNI/BIL CANNULATION INC/IMAGING 03/13/2016 Luanne Bras, MD [facility]  . IR GENERIC HISTORICAL  03/13/2016   IR BONE TUMOR(S)RF ABLATION 03/13/2016 Luanne Bras, MD [facility]  . IR GENERIC HISTORICAL  03/13/2016   IR BONE TUMOR(S)RF ABLATION 03/13/2016 Luanne Bras, MD [facility]  . IR GENERIC HISTORICAL  03/31/2016   IR RADIOLOGIST EVAL & MGMT 03/31/2016 [facility]  . KYPHOPLASTY     02/2016, 03/2016, 11/2016  . LAPAROSCOPIC INGUINAL HERNIA REPAIR Bilateral 12-16-2013  dr gross  . RADIOLOGY WITH ANESTHESIA N/A 02/15/2016   Procedure: Spinal Ablation;  Surgeon: Luanne Bras, MD;  Location: [place];  Service: Radiology;  Laterality: N/A;  . RADIOLOGY WITH ANESTHESIA N/A 03/13/2016   Procedure: LUMBER ABLATION;  Surgeon: Luanne Bras, MD;  Location: [place];  Service: Radiology;  Laterality: N/A;  . ROTATOR CUFF REPAIR Right 2003  . spine operation     x1 in 2018 x3 in 2017  . TONSILLECTOMY  age 47  . VERTEBROPLASTY  01/2016  . WISDOM TOOTH EXTRACTION      Family History  Problem Relation Age of Onset  . Uterine cancer Mother   . Ovarian cancer Mother   . Colon cancer Mother        said it was rectal or colon but not sure  . Rectal cancer Mother   . Heart disease Father   . Hypertension Father   . Multiple sclerosis Sister   . Paranoid behavior Brother   . Drug abuse Brother   .  Schizophrenia Brother   . Stroke Maternal Grandfather   . Cancer Maternal Aunt   . Leukemia Paternal Aunt   . Healthy Son        x1  . Healthy Daughter        x2  . Allergies Daughter        x1  . Diabetes Neg Hx   . Alzheimer's disease Neg Hx   . Parkinson's disease Neg Hx   . Esophageal cancer Neg Hx   . Stomach cancer Neg Hx     No Known Allergies  Current Outpatient Medications on File Prior to Visit  Medication Sig Dispense Refill  . acetaminophen (TYLENOL) 325 MG  tablet Take 2 tablets (650 mg total) by mouth every 6 (six) hours as needed for mild pain (or Fever >/= 101). (Jorge [name] taking differently: Take 325 mg by mouth every 6 (six) hours as needed for mild pain (or Fever >/= 101).)    . albuterol (VENTOLIN HFA) 108 (90 Base) MCG/ACT inhaler TAKE 2 PUFFS BY MOUTH EVERY 6 HOURS AS NEEDED FOR WHEEZE OR SHORTNESS OF BREATH 6.7 each 1  . amLODipine (NORVASC) 5 MG tablet Take by mouth.    [name] azelastine (ASTELIN) 0.1 % nasal spray Place 2 sprays into both nostrils 2 (two) times daily. Use in each nostril as directed 30 mL 2  . cetirizine (ZYRTEC) 10 MG tablet Take 10 mg by mouth daily as needed for allergies.    . Cholecalciferol (VITAMIN D3) 250 MCG (10000 UT) capsule Take 10,000 Units by mouth daily.    . diphenoxylate-atropine (LOMOTIL) 2.5-0.025 MG tablet Take 1 tablet by mouth 4 (four) times daily as needed for diarrhea or loose stools. 30 tablet 0  . fluorouracil (EFUDEX) 5 % cream APPLY TO AFFECTED AREA TWICE A DAY    . fluticasone (FLONASE) 50 MCG/ACT nasal spray SPRAY 2 SPRAYS INTO EACH NOSTRIL EVERY DAY 16 mL 1  . guaiFENesin-codeine 100-10 MG/5ML syrup Take 10 mLs by mouth every 6 (six) hours as needed for cough. 240 mL 0  . lenalidomide (REVLIMID) 5 MG capsule TAKE 1 CAPSULE BY MOUTH  DAILY FOR 21 DAYS, THEN 7  DAYS OFF Auth #-[id number] 21 capsule 0  . levocetirizine (XYZAL) 5 MG tablet Take 1 tablet (5 mg total) by mouth every evening. 30 tablet 3  . lidocaine (LIDODERM) 5  % Place 1 patch onto the skin daily as needed (pain).   2  . Loperamide HCl (IMODIUM PO) Take by mouth as needed.    [name] OVER THE COUNTER MEDICATION Take 1 capsule by mouth daily. methylcare otc supplement    . pantoprazole (PROTONIX) 20 MG tablet TAKE 1 TABLET BY MOUTH  DAILY (Jorge [name] not taking: Reported on 11/15/2020) 90 tablet 3  . Probiotic CAPS Take 1 capsule by mouth daily.    . SYMBICORT 160-4.5 MCG/ACT inhaler TAKE 2 PUFFS BY MOUTH TWICE A DAY 10.2 each 0  . valsartan (DIOVAN) 160 MG tablet Take 1 tablet (160 mg total) by mouth daily. 30 tablet 3  . Vitamins-Lipotropics (LIPO-FLAVONOID PLUS PO) Take 1 tablet by mouth daily.    Alveda Reasons 10 MG TABS tablet Take 1 tablet (10 mg total) by mouth daily. 90 tablet 3  . zolpidem (AMBIEN) 5 MG tablet Take 1 tablet (5 mg total) by mouth at bedtime. 30 tablet 5   Current Facility-Administered Medications on File Prior to Visit  Medication Dose Route Frequency Provider Last Rate Last Admin  . heparin lock flush 100 unit/mL  500 Units Intravenous Once [place], Holli Humbles, NP      . sodium chloride flush (NS) 0.9 % injection 3 mL  3 mL Intravenous Once PRN [place], Sarah M, NP        Pulse 78   Temp (!) 97.3 F (36.3 C)   SpO2 97%       Objective:   Physical Exam       Assessment & Plan:

## 2020-12-19 ENCOUNTER — Ambulatory Visit (INDEPENDENT_AMBULATORY_CARE_PROVIDER_SITE_OTHER): Payer: Medicare Other | Admitting: *Deleted

## 2020-12-19 DIAGNOSIS — U071 COVID-19: Secondary | ICD-10-CM

## 2020-12-19 MED ORDER — SODIUM CHLORIDE 0.9 % IV SOLN
INTRAVENOUS | Status: DC | PRN
Start: 2020-12-19 — End: 2022-12-04

## 2020-12-19 MED ORDER — BEBTELOVIMAB 175 MG/2 ML IV (EUA)
175.0000 mg | Freq: Once | INTRAMUSCULAR | Status: AC
  Administered 2020-12-19: 175 mg via INTRAVENOUS

## 2020-12-19 MED ORDER — DIPHENHYDRAMINE HCL 50 MG/ML IJ SOLN
50.0000 mg | Freq: Once | INTRAMUSCULAR | Status: AC | PRN
Start: 2020-12-19 — End: 2020-12-19

## 2020-12-19 MED ORDER — METHYLPREDNISOLONE SODIUM SUCC 125 MG IJ SOLR: 125.0000 mg | Freq: Once | INTRAMUSCULAR | Status: AC | PRN

## 2020-12-19 MED ORDER — FAMOTIDINE IN NACL 20-0.9 MG/50ML-% IV SOLN: 20.0000 mg | Freq: Once | INTRAVENOUS | Status: AC | PRN

## 2020-12-19 NOTE — Progress Notes (Signed)
Diagnosis: COVID  Provider:  Marshell Garfinkel, MD  Procedure: Infusion  IV Type: Peripheral, IV Location: R Hand  Bebtelovimab, Dose: 175 mg  Infusion Start Time: 1540pm  Infusion Stop Time: 1541pm  Post Infusion IV Care: Observation period completed  Discharge: Condition: Good, Destination: Home . AVS provided to patient.   Performed by:  Oren Beckmann, RN

## 2020-12-19 NOTE — Patient Instructions (Addendum)
10 Things You Can Do to Manage Your COVID-19 Symptoms at Home If you have possible or confirmed COVID-19: 1. Stay home except to get medical care. 2. Monitor your symptoms carefully. If your symptoms get worse, call your healthcare provider immediately. 3. Get rest and stay hydrated. 4. If you have a medical appointment, call the healthcare provider ahead of time and tell them that you have or may have COVID-19. 5. For medical emergencies, call 911 and notify the dispatch personnel that you have or may have COVID-19. 6. Cover your cough and sneezes with a tissue or use the inside of your elbow. 7. Wash your hands often with soap and water for at least 20 seconds or clean your hands with an alcohol-based hand sanitizer that contains at least 60% alcohol. 8. As much as possible, stay in a specific room and away from other people in your home. Also, you should use a separate bathroom, if available. If you need to be around other people in or outside of the home, wear a mask. 9. Avoid sharing personal items with other people in your household, like dishes, towels, and bedding. 10. Clean all surfaces that are touched often, like counters, tabletops, and doorknobs. Use household cleaning sprays or wipes according to the label instructions. cdc.gov/coronavirus 01/27/2020 This information is not intended to replace advice given to you by your health care provider. Make sure you discuss any questions you have with your health care provider. Document Revised: 05/14/2020 Document Reviewed: 05/14/2020 Elsevier Patient Education  2021 Elsevier Inc.  What types of side effects do monoclonal antibody drugs cause?  Common side effects  In general, the more common side effects caused by monoclonal antibody drugs include: . Allergic reactions, such as hives or itching . Flu-like signs and symptoms, including chills, fatigue, fever, and muscle aches and pains . Nausea, vomiting . Diarrhea . Skin  rashes . Low blood pressure   The CDC is recommending patients who receive monoclonal antibody treatments wait at least 90 days before being vaccinated.  Currently, there are no data on the safety and efficacy of mRNA COVID-19 vaccines in persons who received monoclonal antibodies or convalescent plasma as part of COVID-19 treatment. Based on the estimated half-life of such therapies as well as evidence suggesting that reinfection is uncommon in the 90 days after initial infection, vaccination should be deferred for at least 90 days, as a precautionary measure until additional information becomes available, to avoid interference of the antibody treatment with vaccine-induced immune responses.  

## 2020-12-20 ENCOUNTER — Ambulatory Visit: Payer: Medicare Other | Admitting: Emergency Medicine

## 2021-01-01 ENCOUNTER — Other Ambulatory Visit: Payer: Self-pay | Admitting: *Deleted

## 2021-01-01 DIAGNOSIS — C9000 Multiple myeloma not having achieved remission: Secondary | ICD-10-CM

## 2021-01-01 MED ORDER — LENALIDOMIDE 5 MG PO CAPS: ORAL_CAPSULE | ORAL | 0 refills | Status: DC

## 2021-01-10 ENCOUNTER — Inpatient Hospital Stay: Payer: Medicare Other | Attending: Hematology & Oncology

## 2021-01-10 ENCOUNTER — Telehealth: Payer: Self-pay

## 2021-01-10 ENCOUNTER — Other Ambulatory Visit: Payer: Self-pay

## 2021-01-10 ENCOUNTER — Inpatient Hospital Stay (HOSPITAL_BASED_OUTPATIENT_CLINIC_OR_DEPARTMENT_OTHER): Payer: Medicare Other | Admitting: Hematology & Oncology

## 2021-01-10 ENCOUNTER — Inpatient Hospital Stay: Payer: Medicare Other

## 2021-01-10 ENCOUNTER — Encounter: Payer: Self-pay | Admitting: Hematology & Oncology

## 2021-01-10 VITALS — BP 114/71 | HR 68 | Temp 98.5°F | Resp 18 | Wt 186.0 lb

## 2021-01-10 VITALS — BP 117/79 | HR 63

## 2021-01-10 DIAGNOSIS — D509 Iron deficiency anemia, unspecified: Secondary | ICD-10-CM | POA: Diagnosis not present

## 2021-01-10 DIAGNOSIS — Z86718 Personal history of other venous thrombosis and embolism: Secondary | ICD-10-CM | POA: Diagnosis not present

## 2021-01-10 DIAGNOSIS — D500 Iron deficiency anemia secondary to blood loss (chronic): Secondary | ICD-10-CM

## 2021-01-10 DIAGNOSIS — C9000 Multiple myeloma not having achieved remission: Secondary | ICD-10-CM | POA: Diagnosis present

## 2021-01-10 DIAGNOSIS — Z79899 Other long term (current) drug therapy: Secondary | ICD-10-CM | POA: Diagnosis not present

## 2021-01-10 DIAGNOSIS — Z7901 Long term (current) use of anticoagulants: Secondary | ICD-10-CM | POA: Diagnosis not present

## 2021-01-10 DIAGNOSIS — C9001 Multiple myeloma in remission: Secondary | ICD-10-CM

## 2021-01-10 LAB — CBC WITH DIFFERENTIAL (CANCER CENTER ONLY)
Abs Immature Granulocytes: 0.03 10*3/uL (ref 0.00–0.07)
Basophils Absolute: 0 10*3/uL (ref 0.0–0.1)
Basophils Relative: 1 %
Eosinophils Absolute: 0.2 10*3/uL (ref 0.0–0.5)
Eosinophils Relative: 5 %
HCT: 42.8 % (ref 39.0–52.0)
Hemoglobin: 15.1 g/dL (ref 13.0–17.0)
Immature Granulocytes: 1 %
Lymphocytes Relative: 46 %
Lymphs Abs: 1.8 10*3/uL (ref 0.7–4.0)
MCH: 35 pg — ABNORMAL HIGH (ref 26.0–34.0)
MCHC: 35.3 g/dL (ref 30.0–36.0)
MCV: 99.3 fL (ref 80.0–100.0)
Monocytes Absolute: 0.6 10*3/uL (ref 0.1–1.0)
Monocytes Relative: 14 %
Neutro Abs: 1.3 10*3/uL — ABNORMAL LOW (ref 1.7–7.7)
Neutrophils Relative %: 33 %
Platelet Count: 128 10*3/uL — ABNORMAL LOW (ref 150–400)
RBC: 4.31 MIL/uL (ref 4.22–5.81)
RDW: 13.6 % (ref 11.5–15.5)
WBC Count: 4 10*3/uL (ref 4.0–10.5)
nRBC: 0 % (ref 0.0–0.2)

## 2021-01-10 LAB — CMP (CANCER CENTER ONLY)
ALT: 48 U/L — ABNORMAL HIGH (ref 0–44)
AST: 41 U/L (ref 15–41)
Albumin: 4.3 g/dL (ref 3.5–5.0)
Alkaline Phosphatase: 56 U/L (ref 38–126)
Anion gap: 8 (ref 5–15)
BUN: 15 mg/dL (ref 8–23)
CO2: 25 mmol/L (ref 22–32)
Calcium: 9.5 mg/dL (ref 8.9–10.3)
Chloride: 105 mmol/L (ref 98–111)
Creatinine: 0.92 mg/dL (ref 0.61–1.24)
GFR, Estimated: >60 mL/min (ref >60)
Glucose, Bld: 119 mg/dL — ABNORMAL HIGH (ref 70–99)
Potassium: 4.1 mmol/L (ref 3.5–5.1)
Sodium: 138 mmol/L (ref 135–145)
Total Bilirubin: 0.6 mg/dL (ref 0.3–1.2)
Total Protein: 6.8 g/dL (ref 6.5–8.1)

## 2021-01-10 LAB — IRON AND TIBC
Iron: 106 ug/dL (ref 42–163)
Saturation Ratios: 31 % (ref 20–55)
TIBC: 339 ug/dL (ref 202–409)
UIBC: 233 ug/dL (ref 117–376)

## 2021-01-10 LAB — RETICULOCYTES
Immature Retic Fract: 12.3 % (ref 2.3–15.9)
RBC.: 4.28 MIL/uL (ref 4.22–5.81)
Retic Count, Absolute: 59.9 10*3/uL (ref 19.0–186.0)
Retic Ct Pct: 1.4 % (ref 0.4–3.1)

## 2021-01-10 LAB — FERRITIN: Ferritin: 147 ng/mL (ref 24–336)

## 2021-01-10 LAB — LACTATE DEHYDROGENASE: LDH: 187 U/L (ref 98–192)

## 2021-01-10 MED ORDER — ZOLEDRONIC ACID 4 MG/100ML IV SOLN
4.0000 mg | Freq: Once | INTRAVENOUS | Status: AC
  Administered 2021-01-10: 4 mg via INTRAVENOUS
  Filled 2021-01-10: qty 100

## 2021-01-10 NOTE — Patient Instructions (Signed)
Zoledronic Acid injection (Hypercalcemia, Oncology) What is this medicine? ZOLEDRONIC ACID (ZOE le dron ik AS id) lowers the amount of calcium loss from bone. It is used to treat too much calcium in your blood from cancer. It is also used to prevent complications of cancer that has spread to the bone. This medicine may be used for other purposes; ask your health care provider or pharmacist if you have questions. COMMON BRAND NAME(S): Zometa What should I tell my health care provider before I take this medicine? They need to know if you have any of these conditions:  aspirin-sensitive asthma  cancer, especially if you are receiving medicines used to treat cancer  dental disease or wear dentures  infection  kidney disease  receiving corticosteroids like dexamethasone or prednisone  an unusual or allergic reaction to zoledronic acid, other medicines, foods, dyes, or preservatives  pregnant or trying to get pregnant  breast-feeding How should I use this medicine? This medicine is for infusion into a vein. It is given by a health care professional in a hospital or clinic setting. Talk to your pediatrician regarding the use of this medicine in children. Special care may be needed. Overdosage: If you think you have taken too much of this medicine contact a poison control center or emergency room at once. NOTE: This medicine is only for you. Do not share this medicine with others. What if I miss a dose? It is important not to miss your dose. Call your doctor or health care professional if you are unable to keep an appointment. What may interact with this medicine?  certain antibiotics given by injection  NSAIDs, medicines for pain and inflammation, like ibuprofen or naproxen  some diuretics like bumetanide, furosemide  teriparatide  thalidomide This list may not describe all possible interactions. Give your health care provider a list of all the medicines, herbs, non-prescription  drugs, or dietary supplements you use. Also tell them if you smoke, drink alcohol, or use illegal drugs. Some items may interact with your medicine. What should I watch for while using this medicine? Visit your doctor or health care professional for regular checkups. It may be some time before you see the benefit from this medicine. Do not stop taking your medicine unless your doctor tells you to. Your doctor may order blood tests or other tests to see how you are doing. Women should inform their doctor if they wish to become pregnant or think they might be pregnant. There is a potential for serious side effects to an unborn child. Talk to your health care professional or pharmacist for more information. You should make sure that you get enough calcium and vitamin D while you are taking this medicine. Discuss the foods you eat and the vitamins you take with your health care professional. Some people who take this medicine have severe bone, joint, and/or muscle pain. This medicine may also increase your risk for jaw problems or a broken thigh bone. Tell your doctor right away if you have severe pain in your jaw, bones, joints, or muscles. Tell your doctor if you have any pain that does not go away or that gets worse. Tell your dentist and dental surgeon that you are taking this medicine. You should not have major dental surgery while on this medicine. See your dentist to have a dental exam and fix any dental problems before starting this medicine. Take good care of your teeth while on this medicine. Make sure you see your dentist for regular follow-up   appointments. What side effects may I notice from receiving this medicine? Side effects that you should report to your doctor or health care professional as soon as possible:  allergic reactions like skin rash, itching or hives, swelling of the face, lips, or tongue  anxiety, confusion, or depression  breathing problems  changes in vision  eye  pain  feeling faint or lightheaded, falls  jaw pain, especially after dental work  mouth sores  muscle cramps, stiffness, or weakness  redness, blistering, peeling or loosening of the skin, including inside the mouth  trouble passing urine or change in the amount of urine Side effects that usually do not require medical attention (report to your doctor or health care professional if they continue or are bothersome):  bone, joint, or muscle pain  constipation  diarrhea  fever  hair loss  irritation at site where injected  loss of appetite  nausea, vomiting  stomach upset  trouble sleeping  trouble swallowing  weak or tired This list may not describe all possible side effects. Call your doctor for medical advice about side effects. You may report side effects to FDA at 1-800-FDA-1088. Where should I keep my medicine? This drug is given in a hospital or clinic and will not be stored at home. NOTE: This sheet is a summary. It may not cover all possible information. If you have questions about this medicine, talk to your doctor, pharmacist, or health care provider.  2020 Elsevier/Gold Standard (2013-11-26 14:19:39)  

## 2021-01-10 NOTE — Progress Notes (Signed)
Hematology and Oncology Follow Up Visit  Jorge [name] [id number] 09-27-1955 65 y.o. 01/10/2021   Principle Diagnosis:  IgG Kappa myeloma - Hyperdiploid/+11 DVT of the LEFT and RIGHT leg  Iron deficiency anemia  Current Therapy:   Revlimid 41m po q day (21 on/7 off) - start on 04/07/2018   Zometa 4 mg IV q 3 months - next dose is 03/2021 Xarelto 10 mg PO daily IV Iron as needed   Interim History:  Mr. [name] here today for follow-up.  He looks great.  He feels good.  His family is down to the beach right now.  [place]  I am sure that have a fantastic time.  One of the thing that they will do is that they will go to a training camp for dogs to participate in the [place] race.  I am incredibly interested in this.  Hopefully, he will be able to bring back pictures.  He has had no problems with respect to diarrhea.  He says he is on some Imodium for this.  His myeloma studies still look fantastic.  There is no monoclonal spike in his blood.  His IgG level is 1185 mg/dL.  The Kappa light chain is 3.1 mg/dL.  He has had no rashes.  He does have low easy bruising.  He is on Xarelto.  His last iron studies back in March showed a ferritin of 231 with an iron saturation of 37%.  Overall, his performance status is ECOG 0.    Medications:  Allergies as of 01/10/2021   No Known Allergies      Medication List        Accurate as of January 10, 2021 10:22 AM. If you have any questions, ask your nurse or doctor.          STOP taking these medications    diphenoxylate-atropine 2.5-0.025 MG tablet Commonly known as: LOMOTIL Stopped by: [name]       TAKE these medications    acetaminophen 325 MG tablet Commonly known as: TYLENOL Take 2 tablets (650 mg total) by mouth every 6 (six) hours as needed for mild pain (or Fever >/= 101). What changed: how much to take   albuterol 108 (90 Base) MCG/ACT inhaler Commonly known as:  VENTOLIN HFA TAKE 2 PUFFS BY MOUTH EVERY 6 HOURS AS NEEDED FOR WHEEZE OR SHORTNESS OF BREATH   amLODipine 5 MG tablet Commonly known as: NORVASC Take by mouth.   azelastine 0.1 % nasal spray Commonly known as: ASTELIN Place 2 sprays into both nostrils 2 (two) times daily. Use in each nostril as directed   benzonatate 100 MG capsule Commonly known as: TESSALON Take 1 capsule (100 mg total) by mouth 3 (three) times daily as needed for cough.   cetirizine 10 MG tablet Commonly known as: ZYRTEC Take 10 mg by mouth daily as needed for allergies.   fluorouracil 5 % cream Commonly known as: EFUDEX APPLY TO AFFECTED AREA TWICE A DAY   fluticasone 50 MCG/ACT nasal spray Commonly known as: FLONASE SPRAY 2 SPRAYS INTO EACH NOSTRIL EVERY DAY   guaiFENesin-codeine 100-10 MG/5ML syrup Take 10 mLs by mouth every 6 (six) hours as needed for cough.   IMODIUM PO Take by mouth as needed.   lenalidomide 5 MG capsule Commonly known as: Revlimid TAKE 1 CAPSULE BY MOUTH  DAILY FOR 21 DAYS, THEN 7  DAYS OFF Auth ##[id number]  levocetirizine 5 MG  tablet Commonly known as: XYZAL Take 1 tablet (5 mg total) by mouth every evening.   lidocaine 5 % Commonly known as: LIDODERM Place 1 patch onto the skin daily as needed (pain).   LIPO-FLAVONOID PLUS PO Take 1 tablet by mouth daily.   Molnupiravir 200 MG Caps Take 4 capsules (800 mg total) by mouth 2 (two) times daily.   OVER THE COUNTER MEDICATION Take 1 capsule by mouth daily. methylcare otc supplement   pantoprazole 20 MG tablet Commonly known as: PROTONIX TAKE 1 TABLET BY MOUTH  DAILY   Probiotic Caps Take 1 capsule by mouth daily.   Symbicort 160-4.5 MCG/ACT inhaler Generic drug: budesonide-formoterol TAKE 2 PUFFS BY MOUTH TWICE A DAY   valsartan 160 MG tablet Commonly known as: Diovan Take 1 tablet (160 mg total) by mouth daily.   Vitamin D3 250 MCG (10000 UT) capsule Take 10,000 Units by mouth daily.   Xarelto 10 MG Tabs  tablet Generic drug: rivaroxaban Take 1 tablet (10 mg total) by mouth daily.   zolpidem 5 MG tablet Commonly known as: AMBIEN Take 1 tablet (5 mg total) by mouth at bedtime.        Allergies: No Known Allergies  Past Medical History, Surgical history, Social history, and Family History were reviewed and updated.  Review of Systems: Review of Systems  Constitutional: Negative.   HENT: Negative.   Eyes: Negative.   Respiratory: Negative.   Cardiovascular: Negative.   Gastrointestinal: Negative.   Genitourinary: Negative.   Musculoskeletal: Positive for myalgias.  Skin: Negative.   Neurological: Negative.   Endo/Heme/Allergies: Negative.   Psychiatric/Behavioral: Negative.      Physical Exam:  weight is 186 lb (84.4 kg). His oral temperature is 98.5 F (36.9 C). His blood pressure is 114/71 and his pulse is 68. His respiration is 18 and oxygen saturation is 100%.   Wt Readings from Last 3 Encounters:  01/10/21 186 lb (84.4 kg)  11/15/20 187 lb 6.4 oz (85 kg)  10/11/20 181 lb (82.1 kg)    Physical Exam Vitals signs reviewed.  HENT:     Head: Normocephalic and atraumatic.  Eyes:     Pupils: Pupils are equal, round, and reactive to light.  Neck:     Musculoskeletal: Normal range of motion.  Cardiovascular:     Rate and Rhythm: Normal rate and regular rhythm.     Heart sounds: Normal heart sounds.  Pulmonary:     Effort: Pulmonary effort is normal.     Breath sounds: Normal breath sounds.  Abdominal:     General: Bowel sounds are normal.     Palpations: Abdomen is soft.  Musculoskeletal: Normal range of motion.        General: No tenderness or deformity.  Lymphadenopathy:     Cervical: No cervical adenopathy.  Skin:    General: Skin is warm and dry.     Findings: No erythema or rash.  Neurological:     Mental Status: He is alert and oriented to person, place, and time.  Psychiatric:        Behavior: Behavior normal.        Thought Content: Thought  content normal.        Judgment: Judgment normal.      Lab Results  Component Value Date   WBC 4.0 01/10/2021   HGB 15.1 01/10/2021   HCT 42.8 01/10/2021   MCV 99.3 01/10/2021   PLT 128 (L) 01/10/2021   Lab Results  Component Value Date  FERRITIN 231 10/11/2020   IRON 122 10/11/2020   TIBC 330 10/11/2020   UIBC 208 10/11/2020   IRONPCTSAT 37 10/11/2020   Lab Results  Component Value Date   RETICCTPCT 1.4 01/10/2021   RBC 4.28 01/10/2021   Lab Results  Component Value Date   KPAFRELGTCHN 30.7 (H) 10/11/2020   LAMBDASER 25.3 10/11/2020   KAPLAMBRATIO 1.21 10/11/2020   Lab Results  Component Value Date   IGGSERUM 1,185 10/11/2020   IGA 407 10/11/2020   IGMSERUM 32 10/11/2020   Lab Results  Component Value Date   TOTALPROTELP 7.1 10/11/2020   ALBUMINELP 3.8 10/11/2020   A1GS 0.3 10/11/2020   A2GS 0.6 10/11/2020   BETS 1.1 10/11/2020   GAMS 1.2 10/11/2020   MSPIKE Not Observed 10/11/2020   SPEI Comment 01/05/2018     Chemistry      Component Value Date/Time   NA 138 01/10/2021 0939   NA 143 06/30/2017 1049   NA 140 10/03/2016 0845   K 4.1 01/10/2021 0939   K 4.0 06/30/2017 1049   K 4.3 10/03/2016 0845   CL 105 01/10/2021 0939   CL 105 06/30/2017 1049   CO2 25 01/10/2021 0939   CO2 27 06/30/2017 1049   CO2 26 10/03/2016 0845   BUN 15 01/10/2021 0939   BUN 13 06/30/2017 1049   BUN 11.9 10/03/2016 0845   CREATININE 0.92 01/10/2021 0939   CREATININE 1.0 06/30/2017 1049   CREATININE 0.9 10/03/2016 0845      Component Value Date/Time   CALCIUM 9.5 01/10/2021 0939   CALCIUM 9.0 06/30/2017 1049   CALCIUM 9.9 10/03/2016 0845   ALKPHOS 56 01/10/2021 0939   ALKPHOS 57 06/30/2017 1049   ALKPHOS 80 10/03/2016 0845   AST 41 01/10/2021 0939   AST 28 10/03/2016 0845   ALT 48 (H) 01/10/2021 0939   ALT 43 06/30/2017 1049   ALT 26 10/03/2016 0845   BILITOT 0.6 01/10/2021 0939   BILITOT 0.48 10/03/2016 0845      Impression and Plan: Mr. Pompei is a  pleasant 65 yo gentleman with IgG kappa myeloma.  He ultimately underwent an autologous stem cell transplant at [facility] in February 2018.   It is nice to see that everything is going well for him.  He is doing great.  He is tolerated the Revlimid quite nicely.  He is due for his Zometa today.  He was schisis when we ever see him back.  We will plan to see him back in another 3 months.  Again I look forward to hearing about his [place].   [name], MD 6/30/202210:22 AM

## 2021-01-10 NOTE — Telephone Encounter (Signed)
Appts made per 01/10/21 los, pt to gain updated sch at [facility], tx/avs    Jorge [name]

## 2021-01-11 LAB — KAPPA/LAMBDA LIGHT CHAINS
Kappa free light chain: 31.6 mg/L — ABNORMAL HIGH (ref 3.3–19.4)
Kappa, lambda light chain ratio: 1.21 (ref 0.26–1.65)
Lambda free light chains: 26.2 mg/L (ref 5.7–26.3)

## 2021-01-11 LAB — PROTEIN ELECTROPHORESIS, SERUM, WITH REFLEX
A/G Ratio: 1.5 (ref 0.7–1.7)
Albumin ELP: 4.1 g/dL (ref 2.9–4.4)
Alpha-1-Globulin: 0.2 g/dL (ref 0.0–0.4)
Alpha-2-Globulin: 0.5 g/dL (ref 0.4–1.0)
Beta Globulin: 1 g/dL (ref 0.7–1.3)
Gamma Globulin: 1.1 g/dL (ref 0.4–1.8)
Globulin, Total: 2.7 g/dL (ref 2.2–3.9)
Total Protein ELP: 6.8 g/dL (ref 6.0–8.5)

## 2021-01-11 LAB — IGG, IGA, IGM
IgA: 382 mg/dL (ref 61–437)
IgG (Immunoglobin G), Serum: 1061 mg/dL (ref 603–1613)
IgM (Immunoglobulin M), Srm: 28 mg/dL (ref 20–172)

## 2021-01-17 ENCOUNTER — Encounter: Payer: Self-pay | Admitting: Hematology & Oncology

## 2021-01-22 ENCOUNTER — Ambulatory Visit (INDEPENDENT_AMBULATORY_CARE_PROVIDER_SITE_OTHER): Payer: Medicare Other | Admitting: Emergency Medicine

## 2021-01-22 ENCOUNTER — Other Ambulatory Visit: Payer: Self-pay

## 2021-01-22 ENCOUNTER — Encounter: Payer: Self-pay | Admitting: Emergency Medicine

## 2021-01-22 DIAGNOSIS — J310 Chronic rhinitis: Secondary | ICD-10-CM | POA: Diagnosis not present

## 2021-01-22 DIAGNOSIS — K219 Gastro-esophageal reflux disease without esophagitis: Secondary | ICD-10-CM

## 2021-01-22 DIAGNOSIS — R053 Chronic cough: Secondary | ICD-10-CM | POA: Diagnosis not present

## 2021-01-22 DIAGNOSIS — K449 Diaphragmatic hernia without obstruction or gangrene: Secondary | ICD-10-CM

## 2021-01-22 MED ORDER — MONTELUKAST SODIUM 10 MG PO TABS: 10.0000 mg | ORAL_TABLET | Freq: Every day | ORAL | 11 refills | Status: DC

## 2021-01-22 NOTE — Assessment & Plan Note (Signed)
Improved since we changed his ACE inhibitor to ARB but persists.  Still has significant rhinitis which would appear to be the biggest contributor.  Unclear how much role GERD is playing.  He did do a trial of PPI for 3 weeks.  We will work to control his rhinitis more effectively.  I think he needs PFT to ensure no evidence of occult obstructive lung disease.  If he continues to cough then will recommend bronchoscopy and airway inspection.

## 2021-01-22 NOTE — Progress Notes (Signed)
Subjective:    Patient ID: Jorge [name], male    DOB: 1956/03/06, 65 y.o.   MRN: [id number]  HPI 65 yo man, former tobacco (7 pk-yrs), History of multiple myeloma on therapy w transplant 2018, allergic rhinitis, GERD with a hiatal hernia, lower extremity DVT, chronic back pain.  He is referred today for evaluation of cough.  Started to become very persistent about 2 yrs ago. Brought on by URI's. Has PND, still has some but less on astelin NS, xyzal, currently off flonase. No GERD sx. He has been on lisinopril for over a year  CXR 09/13/20, elevated R HD, some R basilar atx  Ct chest 09/13/2020 reviewed by me shows no pulmonary embolism, chronically elevated right hemidiaphragm with some associated right basilar atelectasis, scattered areas of subsegmental atelectasis versus infiltrate  ROV 01/22/21 --follow-up visit for 65 year old man with a minimal tobacco history, multiple myeloma with transplant 2018, allergic rhinitis, GERD with hiatal hernia.  Also with a history of lower extremity DVT.  I saw him in May for evaluation of chronic cough. At his last visit I changed his lisinopril to valsartan, continued Xyzal and Astelin nasal spray, restarted fluticasone nasal spray. He added protonix for 3 weeks without any effect.  Has symbicort but does not use with any regularity   Review of Systems As per HPI  Past Medical History:  Diagnosis Date   Allergy    Anxiety    Blood transfusion without reported diagnosis 2018   Bone metastasis ([facility])    Chest cold 05/19/2016   productive cough  -- started on antibiotic   Chronic back pain    due to bone mets from myeloma   Cough    Depression    Diverticulitis    GERD (gastroesophageal reflux disease)    Hiatal hernia    History of chicken pox    History of concussion    age 65 -- no residual   History of DVT of lower extremity 03/21/2016  treated and completed w/ xarelto   per doppler left extensive occlusion common femoral, femoral, and  popliteal veins and right partial occlusion common femoral and profunda femoral veins/  last doppler 06-04-2016 no evidence acute or chronic dvt noted either leg    History of radiation therapy 06/09/16-06/23/16   lower thoracic spine 25 Gy in 10 fractions   Mouth ulcers    secondary to radiation   Multiple myeloma ([facility]) dx 02/22/2016 via bone marrow bx---  oncologist-  [name]   IgG Kappa-- Hyperdiploid/ +11 w/ bone mets--  current treatment chemotherapy (started 08/ 2017)and pallitive radiation to back started 06-09-2016   Renal calculus, right    Wears contact lenses      Family History  Problem Relation Age of Onset   Uterine cancer Mother    Ovarian cancer Mother    Colon cancer Mother        said it was rectal or colon but not sure   Rectal cancer Mother    Heart disease Father    Hypertension Father    Multiple sclerosis Sister    Paranoid behavior Brother    Drug abuse Brother    Schizophrenia Brother    Stroke Maternal Grandfather    Cancer Maternal Aunt    Leukemia Paternal Aunt    Healthy Son        x1   Healthy Daughter        x2   Allergies Daughter        x1  Diabetes Neg Hx    Alzheimer's disease Neg Hx    Parkinson's disease Neg Hx    Esophageal cancer Neg Hx    Stomach cancer Neg Hx      Social History   Socioeconomic History   Marital status: Married    Spouse name: Not on file   Number of children: 3   Years of education: Not on file   Highest education level: Not on file  Occupational History   Not on file  Tobacco Use   Smoking status: Former    Packs/day: 1.00    Years: 7.00    Pack years: 7.00    Types: Cigarettes    Quit date: 11/02/1980    Years since quitting: 40.2   Smokeless tobacco: Never  Vaping Use   Vaping Use: Never used  Substance and Sexual Activity   Alcohol use: Yes    Comment: occasional   Drug use: No   Sexual activity: Never  Other Topics Concern   Not on file  Social History Narrative   Not on file    Social Determinants of Health   Financial Resource Strain: Not on file  Food Insecurity: Not on file  Transportation Needs: Not on file  Physical Activity: Not on file  Stress: Not on file  Social Connections: Not on file  Intimate Partner Violence: Not on file    From [place], [place], [place], [place] No military Some exposure to wood dusts and chemicals through work.   No Known Allergies   Outpatient Medications Prior to Visit  Medication Sig Dispense Refill   acetaminophen (TYLENOL) 325 MG tablet Take 2 tablets (650 mg total) by mouth every 6 (six) hours as needed for mild pain (or Fever >/= 101). (Patient taking differently: Take 325 mg by mouth every 6 (six) hours as needed for mild pain (or Fever >/= 101).)     albuterol (VENTOLIN HFA) 108 (90 Base) MCG/ACT inhaler TAKE 2 PUFFS BY MOUTH EVERY 6 HOURS AS NEEDED FOR WHEEZE OR SHORTNESS OF BREATH 6.7 each 1   amLODipine (NORVASC) 5 MG tablet Take by mouth.     azelastine (ASTELIN) 0.1 % nasal spray Place 2 sprays into both nostrils 2 (two) times daily. Use in each nostril as directed 30 mL 2   benzonatate (TESSALON) 100 MG capsule Take 1 capsule (100 mg total) by mouth 3 (three) times daily as needed for cough. 30 capsule 0   cetirizine (ZYRTEC) 10 MG tablet Take 10 mg by mouth daily as needed for allergies.     Cholecalciferol (VITAMIN D3) 250 MCG (10000 UT) capsule Take 10,000 Units by mouth daily.     fluorouracil (EFUDEX) 5 % cream APPLY TO AFFECTED AREA TWICE A DAY     fluticasone (FLONASE) 50 MCG/ACT nasal spray SPRAY 2 SPRAYS INTO EACH NOSTRIL EVERY DAY 16 mL 1   guaiFENesin-codeine 100-10 MG/5ML syrup Take 10 mLs by mouth every 6 (six) hours as needed for cough. 240 mL 0   lenalidomide (REVLIMID) 5 MG capsule TAKE 1 CAPSULE BY MOUTH  DAILY FOR 21 DAYS, THEN 7  DAYS OFF Auth #-[id number] 21 capsule 0   levocetirizine (XYZAL) 5 MG tablet Take 1 tablet (5 mg total) by mouth every evening. 30 tablet 3   lidocaine (LIDODERM) 5 % Place 1 patch onto  the skin daily as needed (pain).   2   Loperamide HCl (IMODIUM PO) Take by mouth as needed.     Molnupiravir 200 MG CAPS Take 4 capsules (800  mg total) by mouth 2 (two) times daily. 40 capsule 0   OVER THE COUNTER MEDICATION Take 1 capsule by mouth daily. methylcare otc supplement     pantoprazole (PROTONIX) 20 MG tablet TAKE 1 TABLET BY MOUTH  DAILY 90 tablet 3   Probiotic CAPS Take 1 capsule by mouth daily.     SYMBICORT 160-4.5 MCG/ACT inhaler TAKE 2 PUFFS BY MOUTH TWICE A DAY 10.2 each 0   valsartan (DIOVAN) 160 MG tablet Take 1 tablet (160 mg total) by mouth daily. 30 tablet 3   Vitamins-Lipotropics (LIPO-FLAVONOID PLUS PO) Take 1 tablet by mouth daily.     XARELTO 10 MG TABS tablet Take 1 tablet (10 mg total) by mouth daily. 90 tablet 3   zolpidem (AMBIEN) 5 MG tablet Take 1 tablet (5 mg total) by mouth at bedtime. 30 tablet 5   Facility-Administered Medications Prior to Visit  Medication Dose Route Frequency Provider Last Rate Last Admin   0.9 %  sodium chloride infusion   Intravenous PRN Saguier, Percell Miller, PA-C       heparin lock flush 100 unit/mL  500 Units Intravenous Once [place], Sarah M, NP       sodium chloride flush (NS) 0.9 % injection 3 mL  3 mL Intravenous Once PRN [place], Holli Humbles, NP            Objective:   Physical Exam  Vitals:   01/22/21 1633  BP: 118/72  Pulse: 61  Temp: 98 F (36.7 C)  TempSrc: Oral  SpO2: 98%  Weight: 188 lb 6.4 oz (85.5 kg)  Height: '5\' 7"'  (1.702 m)   Gen: Pleasant, well-nourished, in no distress,  normal affect  ENT: No lesions,  mouth clear,  oropharynx clear, no postnasal drip  Neck: No JVD, no stridor  Lungs: No use of accessory muscles, no crackles or wheezing on normal respiration, no wheeze on forced expiration  Cardiovascular: RRR, heart sounds normal, no murmur or gallops, no peripheral edema  Musculoskeletal: No deformities, no cyanosis or clubbing  Neuro: alert, awake, non focal  Skin: Warm, no lesions or  rash     Assessment & Plan:  Hiatal hernia with GERD He tried starting Protonix for 3 weeks to see if this would impact his cough.  No real change.  He is now off of the PPI.  We may decide to revisit, restart at some point going forward depending on progress.  Chronic cough Improved since we changed his ACE inhibitor to ARB but persists.  Still has significant rhinitis which would appear to be the biggest contributor.  Unclear how much role GERD is playing.  He did do a trial of PPI for 3 weeks.  We will work to control his rhinitis more effectively.  I think he needs PFT to ensure no evidence of occult obstructive lung disease.  If he continues to cough then will recommend bronchoscopy and airway inspection.  Chronic rhinitis Increase your fluticasone nasal spray to 2 sprays each nostril twice a day.  Try to avoid taking it right before bedtime You can try using your Astelin nasal spray, 2 sprays each nostril 2-3 times daily as needed for flaring nasal drainage Continue Xyzal each evening We will start Singulair 10 mg each evening Try starting nasal saline rinses once daily We will refer you to Allergy to consider possible skin testing   Baltazar Apo, MD, PhD 01/22/2021, 5:10 PM [facility] [phone] or if no answer before 7:00PM call [phone] For any  issues after 7:00PM please call eLink [phone]

## 2021-01-22 NOTE — Assessment & Plan Note (Signed)
Increase your fluticasone nasal spray to 2 sprays each nostril twice a day.  Try to avoid taking it right before bedtime You can try using your Astelin nasal spray, 2 sprays each nostril 2-3 times daily as needed for flaring nasal drainage Continue Xyzal each evening We will start Singulair 10 mg each evening Try starting nasal saline rinses once daily We will refer you to Allergy to consider possible skin testing

## 2021-01-22 NOTE — Patient Instructions (Addendum)
Increase your fluticasone nasal spray to 2 sprays each nostril twice a day.  Try to avoid taking it right before bedtime You can try using your Astelin nasal spray, 2 sprays each nostril 2-3 times daily as needed for flaring nasal drainage Continue valsartan as you have been taking it. Continue Xyzal each evening We will start Singulair 10 mg each evening Try starting nasal saline rinses once daily We will refer you to Allergy to consider possible skin testing We will perform pulmonary function testing at your next office visit. Depending on how your cough does we will decide next time whether you might need bronchoscopy Follow with Dr. Lamonte Sakai next available with full pulmonary function testing on the same day.

## 2021-01-22 NOTE — Addendum Note (Signed)
Addended by: Gavin Potters R on: 01/22/2021 05:17 PM   Modules accepted: Orders

## 2021-01-22 NOTE — Assessment & Plan Note (Signed)
He tried starting Protonix for 3 weeks to see if this would impact his cough.  No real change.  He is now off of the PPI.  We may decide to revisit, restart at some point going forward depending on progress.

## 2021-01-30 ENCOUNTER — Other Ambulatory Visit: Payer: Self-pay | Admitting: *Deleted

## 2021-01-30 DIAGNOSIS — C9000 Multiple myeloma not having achieved remission: Secondary | ICD-10-CM

## 2021-01-30 MED ORDER — LENALIDOMIDE 5 MG PO CAPS: ORAL_CAPSULE | ORAL | 0 refills | Status: DC

## 2021-02-01 ENCOUNTER — Telehealth: Payer: Self-pay

## 2021-02-01 ENCOUNTER — Telehealth: Payer: Self-pay | Admitting: Medical

## 2021-02-01 MED ORDER — AMOXICILLIN 500 MG PO CAPS: ORAL_CAPSULE | ORAL | 0 refills | Status: DC

## 2021-02-01 NOTE — Telephone Encounter (Signed)
Pt needs a refill on amoxicillin for dental procedure on 02/05/2021 . States its just 2 tablets 500 mg each   Pharmacy: [facility] [place] , [place]

## 2021-02-01 NOTE — Telephone Encounter (Signed)
Rx amoxicillin sent to pt pharmacy.

## 2021-02-06 ENCOUNTER — Telehealth: Payer: Self-pay | Admitting: *Deleted

## 2021-02-06 ENCOUNTER — Encounter: Payer: Self-pay | Admitting: Hematology & Oncology

## 2021-02-06 DIAGNOSIS — C9000 Multiple myeloma not having achieved remission: Secondary | ICD-10-CM

## 2021-02-06 NOTE — Telephone Encounter (Signed)
Call received from patient stating that last night his feet and ankles became swollen and that he has "blood coming through his skin on forearms."  Requested that pt send a picture of his forearms via MyChart.  Dr. Marin Olp reviewed photos and would like for pt to come in tomorrow for a CBC.  Message sent to scheduling and pt notified via reply to [place] message.

## 2021-02-07 ENCOUNTER — Other Ambulatory Visit: Payer: Self-pay

## 2021-02-07 ENCOUNTER — Inpatient Hospital Stay: Payer: Medicare Other | Attending: Hematology & Oncology

## 2021-02-07 DIAGNOSIS — C9000 Multiple myeloma not having achieved remission: Secondary | ICD-10-CM | POA: Insufficient documentation

## 2021-02-07 LAB — CBC WITH DIFFERENTIAL (CANCER CENTER ONLY)
Abs Immature Granulocytes: 0.01 10*3/uL (ref 0.00–0.07)
Basophils Absolute: 0.1 10*3/uL (ref 0.0–0.1)
Basophils Relative: 2 %
Eosinophils Absolute: 0.1 10*3/uL (ref 0.0–0.5)
Eosinophils Relative: 3 %
HCT: 44.1 % (ref 39.0–52.0)
Hemoglobin: 15.1 g/dL (ref 13.0–17.0)
Immature Granulocytes: 0 %
Lymphocytes Relative: 44 %
Lymphs Abs: 1.8 10*3/uL (ref 0.7–4.0)
MCH: 35.3 pg — ABNORMAL HIGH (ref 26.0–34.0)
MCHC: 34.2 g/dL (ref 30.0–36.0)
MCV: 103 fL — ABNORMAL HIGH (ref 80.0–100.0)
Monocytes Absolute: 0.7 10*3/uL (ref 0.1–1.0)
Monocytes Relative: 16 %
Neutro Abs: 1.4 10*3/uL — ABNORMAL LOW (ref 1.7–7.7)
Neutrophils Relative %: 35 %
Platelet Count: 121 10*3/uL — ABNORMAL LOW (ref 150–400)
RBC: 4.28 MIL/uL (ref 4.22–5.81)
RDW: 13.7 % (ref 11.5–15.5)
WBC Count: 4.1 10*3/uL (ref 4.0–10.5)
nRBC: 0 % (ref 0.0–0.2)

## 2021-02-07 NOTE — Telephone Encounter (Signed)
Patient never picked up Mailed it too him

## 2021-02-20 ENCOUNTER — Other Ambulatory Visit: Payer: Self-pay | Admitting: Medical

## 2021-02-26 ENCOUNTER — Ambulatory Visit (INDEPENDENT_AMBULATORY_CARE_PROVIDER_SITE_OTHER): Payer: Medicare Other | Admitting: Emergency Medicine

## 2021-02-26 ENCOUNTER — Encounter: Payer: Self-pay | Admitting: Emergency Medicine

## 2021-02-26 ENCOUNTER — Other Ambulatory Visit: Payer: Self-pay

## 2021-02-26 DIAGNOSIS — J310 Chronic rhinitis: Secondary | ICD-10-CM | POA: Diagnosis not present

## 2021-02-26 DIAGNOSIS — K219 Gastro-esophageal reflux disease without esophagitis: Secondary | ICD-10-CM

## 2021-02-26 DIAGNOSIS — K449 Diaphragmatic hernia without obstruction or gangrene: Secondary | ICD-10-CM | POA: Diagnosis not present

## 2021-02-26 DIAGNOSIS — R053 Chronic cough: Secondary | ICD-10-CM

## 2021-02-26 LAB — PULMONARY FUNCTION TEST
DL/VA % pred: 104 %
DL/VA: 4.36 ml/min/mmHg/L
DLCO cor % pred: 76 %
DLCO cor: 18.95 ml/min/mmHg
DLCO unc % pred: 77 %
DLCO unc: 19.22 ml/min/mmHg
FEF 25-75 Post: 1.31 L/sec
FEF 25-75 Pre: 0.97 L/sec
FEF2575-%Change-Post: 34 %
FEF2575-%Pred-Post: 52 %
FEF2575-%Pred-Pre: 39 %
FEV1-%Change-Post: 6 %
FEV1-%Pred-Post: 58 %
FEV1-%Pred-Pre: 54 %
FEV1-Post: 1.82 L
FEV1-Pre: 1.72 L
FEV1FVC-%Change-Post: 6 %
FEV1FVC-%Pred-Pre: 92 %
FEV6-%Change-Post: 0 %
FEV6-%Pred-Post: 61 %
FEV6-%Pred-Pre: 62 %
FEV6-Post: 2.45 L
FEV6-Pre: 2.48 L
FEV6FVC-%Change-Post: 0 %
FEV6FVC-%Pred-Post: 105 %
FEV6FVC-%Pred-Pre: 105 %
FVC-%Change-Post: 0 %
FVC-%Pred-Post: 58 %
FVC-%Pred-Pre: 59 %
FVC-Post: 2.47 L
FVC-Pre: 2.48 L
Post FEV1/FVC ratio: 74 %
Post FEV6/FVC ratio: 99 %
Pre FEV1/FVC ratio: 69 %
Pre FEV6/FVC Ratio: 100 %
RV % pred: 97 %
RV: 2.16 L
TLC % pred: 73 %
TLC: 4.81 L

## 2021-02-26 NOTE — Assessment & Plan Note (Signed)
Depending on how his symptoms progress, cough progresses we may decide to retry empiric treatment for reflux.

## 2021-02-26 NOTE — Assessment & Plan Note (Signed)
Much of his daily symptomatology sounds like upper airway irritation and has benefited from more aggressive treatment of his rhinitis, changing his ACE inhibitor.  His pulmonary function testing today also shows obstructive lung disease consistent with probable fixed asthma.  Possible that his true asthma only flares under certain circumstances with certain exposures.  He does have albuterol available to use if needed.  Depending on course we may decide to try an ICS/LABA at some point.  We reviewed your pulmonary function testing today. Keep albuterol available to use 2 puffs when needed for shortness of breath, wheezing, spells of coughing that do not quickly resolve.  You may also want to try taking 2 puffs prior to exercise. Continue your Xyzal, fluticasone nasal spray and Astelin nasal spray the way you are using them. We will not restart Singulair Follow-up with the allergist as planned to see if you might be a candidate for immunotherapy (allergy shots) Depending on how your symptoms do going forward we may decide to retry treatment for esophageal reflux. Follow with Dr Lamonte Sakai in 6 months or sooner if you have any problems

## 2021-02-26 NOTE — Progress Notes (Signed)
PFT done today. 

## 2021-02-26 NOTE — Patient Instructions (Addendum)
We reviewed your pulmonary function testing today. Keep albuterol available to use 2 puffs when needed for shortness of breath, wheezing, spells of coughing that do not quickly resolve.  You may also want to try taking 2 puffs prior to exercise. Continue your Xyzal, fluticasone nasal spray and Astelin nasal spray the way you are using them. We will not restart Singulair Follow-up with the allergist as planned to see if you might be a candidate for immunotherapy (allergy shots) Depending on how your symptoms do going forward we may decide to retry treatment for esophageal reflux. Follow with Dr Lamonte Sakai in 6 months or sooner if you have any problems

## 2021-02-26 NOTE — Progress Notes (Signed)
Subjective:    Patient ID: Jorge [name], male    DOB: 09-12-1955, 65 y.o.   MRN: [id number]  HPI 65 yo man, former tobacco (7 pk-yrs), History of multiple myeloma on therapy w transplant 2018, allergic rhinitis, GERD with a hiatal hernia, lower extremity DVT, chronic back pain.  He is referred today for evaluation of cough.  Started to become very persistent about 2 yrs ago. Brought on by URI's. Has PND, still has some but less on astelin NS, xyzal, currently off flonase. No GERD sx. He has been on lisinopril for over a year  CXR 09/13/20, elevated R HD, some R basilar atx  Ct chest 09/13/2020 reviewed by me shows no pulmonary embolism, chronically elevated right hemidiaphragm with some associated right basilar atelectasis, scattered areas of subsegmental atelectasis versus infiltrate  ROV 01/22/21 --follow-up visit for 65 year old man with a minimal tobacco history, multiple myeloma with transplant 2018, allergic rhinitis, GERD with hiatal hernia.  Also with a history of lower extremity DVT.  I saw him in May for evaluation of chronic cough. At his last visit I changed his lisinopril to valsartan, continued Xyzal and Astelin nasal spray, restarted fluticasone nasal spray. He added protonix for 3 weeks without any effect.  Has symbicort but does not use with any regularity  ROV 02/26/21 --this is a follow-up visit for 65 year old gentleman with history of multiple myeloma post transplant, rhinitis, GERD with hiatal hernia, lower extremity DVT.  We have been following him for chronic cough.  Tried treating allergic rhinitis more aggressively, changed his lisinopril to valsartan.  He did not seem to benefit from PPI. At his last visit I increased his fluticasone nasal but twice a day, continued Astelin and Xyzal, started Singulair and nasal saline rinses.  Also planning for him to see the allergist for possible skin testing. He started having some side effects with Singulair > anxiety, anhedonia.  Stopped it. Has done some nasal rinses but not every day. He will have wheeze, dyspnea when he has URI's in the past. The cough is better - happens some in the am  Pulmonary function testing performed today and reviewed by me show significant obstruction with superimposed restriction without a bronchodilator response.  His lung volumes show restriction.  Diffusion capacity is decreased but corrects to the normal range when adjusted for his alveolar volume.  His flow-volume loop has significant curve consistent with obstruction.   Review of Systems As per HPI  Past Medical History:  Diagnosis Date   Allergy    Anxiety    Blood transfusion without reported diagnosis 2018   Bone metastasis ([place])    Chest cold 05/19/2016   productive cough  -- started on antibiotic   Chronic back pain    due to bone mets from myeloma   Cough    Depression    Diverticulitis    GERD (gastroesophageal reflux disease)    Hiatal hernia    History of chicken pox    History of concussion    age 65 -- no residual   History of DVT of lower extremity 03/21/2016  treated and completed w/ xarelto   per doppler left extensive occlusion common femoral, femoral, and popliteal veins and right partial occlusion common femoral and profunda femoral veins/  last doppler 06-04-2016 no evidence acute or chronic dvt noted either leg    History of radiation therapy 06/09/16-06/23/16   lower thoracic spine 25 Gy in 10 fractions   Mouth ulcers    secondary to radiation  Multiple myeloma ([place]) dx 02/22/2016 via bone marrow bx---  oncologist-  [name]   IgG Kappa-- Hyperdiploid/ +11 w/ bone mets--  current treatment chemotherapy (started 08/ 2017)and pallitive radiation to back started 06-09-2016   Renal calculus, right    Wears contact lenses      Family History  Problem Relation Age of Onset   Uterine cancer Mother    Ovarian cancer Mother    Colon cancer Mother        said it was rectal or colon but not sure    Rectal cancer Mother    Heart disease Father    Hypertension Father    Multiple sclerosis Sister    Paranoid behavior Brother    Drug abuse Brother    Schizophrenia Brother    Stroke Maternal Grandfather    Cancer Maternal Aunt    Leukemia Paternal Aunt    Healthy Son        x1   Healthy Daughter        x2   Allergies Daughter        x1   Diabetes Neg Hx    Alzheimer's disease Neg Hx    Parkinson's disease Neg Hx    Esophageal cancer Neg Hx    Stomach cancer Neg Hx      Social History   Socioeconomic History   Marital status: Married    Spouse name: Not on file   Number of children: 3   Years of education: Not on file   Highest education level: Not on file  Occupational History   Not on file  Tobacco Use   Smoking status: Former    Packs/day: 1.00    Years: 7.00    Pack years: 7.00    Types: Cigarettes    Quit date: 11/02/1980    Years since quitting: 40.3   Smokeless tobacco: Never  Vaping Use   Vaping Use: Never used  Substance and Sexual Activity   Alcohol use: Yes    Comment: occasional   Drug use: No   Sexual activity: Never  Other Topics Concern   Not on file  Social History Narrative   Not on file   Social Determinants of Health   Financial Resource Strain: Not on file  Food Insecurity: Not on file  Transportation Needs: Not on file  Physical Activity: Not on file  Stress: Not on file  Social Connections: Not on file  Intimate Partner Violence: Not on file    From [place], [place], [place], [place] No military Some exposure to wood dusts and chemicals through work.   No Known Allergies   Outpatient Medications Prior to Visit  Medication Sig Dispense Refill   acetaminophen (TYLENOL) 325 MG tablet Take 2 tablets (650 mg total) by mouth every 6 (six) hours as needed for mild pain (or Fever >/= 101). (Patient taking differently: Take 325 mg by mouth every 6 (six) hours as needed for mild pain (or Fever >/= 101).)     albuterol (VENTOLIN HFA) 108 (90 Base)  MCG/ACT inhaler TAKE 2 PUFFS BY MOUTH EVERY 6 HOURS AS NEEDED FOR WHEEZE OR SHORTNESS OF BREATH 6.7 each 1   amLODipine (NORVASC) 5 MG tablet Take by mouth.     amoxicillin (AMOXIL) 500 MG capsule 2 tabs or capsules po prior to dental procedure. 2 capsule 0   azelastine (ASTELIN) 0.1 % nasal spray Place 2 sprays into both nostrils 2 (two) times daily. Use in each nostril as directed 30 mL 2  benzonatate (TESSALON) 100 MG capsule Take 1 capsule (100 mg total) by mouth 3 (three) times daily as needed for cough. 30 capsule 0   cetirizine (ZYRTEC) 10 MG tablet Take 10 mg by mouth daily as needed for allergies.     Cholecalciferol (VITAMIN D3) 250 MCG (10000 UT) capsule Take 10,000 Units by mouth daily.     fluorouracil (EFUDEX) 5 % cream APPLY TO AFFECTED AREA TWICE A DAY     fluticasone (FLONASE) 50 MCG/ACT nasal spray SPRAY 2 SPRAYS INTO EACH NOSTRIL EVERY DAY 16 mL 1   guaiFENesin-codeine 100-10 MG/5ML syrup Take 10 mLs by mouth every 6 (six) hours as needed for cough. 240 mL 0   lenalidomide (REVLIMID) 5 MG capsule TAKE 1 CAPSULE BY MOUTH  DAILY FOR 21 DAYS, THEN 7  DAYS OFF Auth #-[id number] 21 capsule 0   levocetirizine (XYZAL) 5 MG tablet TAKE 1 TABLET BY MOUTH EVERY DAY IN THE EVENING 30 tablet 3   lidocaine (LIDODERM) 5 % Place 1 patch onto the skin daily as needed (pain).   2   Loperamide HCl (IMODIUM PO) Take by mouth as needed.     Molnupiravir 200 MG CAPS Take 4 capsules (800 mg total) by mouth 2 (two) times daily. 40 capsule 0   OVER THE COUNTER MEDICATION Take 1 capsule by mouth daily. methylcare otc supplement     pantoprazole (PROTONIX) 20 MG tablet TAKE 1 TABLET BY MOUTH  DAILY 90 tablet 3   Probiotic CAPS Take 1 capsule by mouth daily.     SYMBICORT 160-4.5 MCG/ACT inhaler TAKE 2 PUFFS BY MOUTH TWICE A DAY 10.2 each 0   valsartan (DIOVAN) 160 MG tablet Take 1 tablet (160 mg total) by mouth daily. 30 tablet 3   Vitamins-Lipotropics (LIPO-FLAVONOID PLUS PO) Take 1 tablet by mouth  daily.     XARELTO 10 MG TABS tablet Take 1 tablet (10 mg total) by mouth daily. 90 tablet 3   zolpidem (AMBIEN) 5 MG tablet Take 1 tablet (5 mg total) by mouth at bedtime. 30 tablet 5   montelukast (SINGULAIR) 10 MG tablet Take 1 tablet (10 mg total) by mouth at bedtime. 30 tablet 11   Facility-Administered Medications Prior to Visit  Medication Dose Route Frequency Provider Last Rate Last Admin   0.9 %  sodium chloride infusion   Intravenous PRN Saguier, Percell Miller, PA-C       heparin lock flush 100 unit/mL  500 Units Intravenous Once [place], Holli Humbles, NP       sodium chloride flush (NS) 0.9 % injection 3 mL  3 mL Intravenous Once PRN [place], Holli Humbles, NP            Objective:   Physical Exam  Vitals:   02/26/21 1559  BP: 118/76  Pulse: 67  SpO2: 97%  Weight: 189 lb 3.2 oz (85.8 kg)  Height: 5' 7.5" (1.715 m)   Gen: Pleasant, well-nourished, in no distress,  normal affect  ENT: No lesions,  mouth clear,  oropharynx clear, no postnasal drip  Neck: No JVD, no stridor  Lungs: No use of accessory muscles, no crackles or wheezing on normal respiration, no wheeze on forced expiration  Cardiovascular: RRR, heart sounds normal, no murmur or gallops, no peripheral edema  Musculoskeletal: No deformities, no cyanosis or clubbing  Neuro: alert, awake, non focal  Skin: Warm, no lesions or rash     Assessment & Plan:  Chronic cough Much of his daily symptomatology sounds like upper airway irritation and  has benefited from more aggressive treatment of his rhinitis, changing his ACE inhibitor.  His pulmonary function testing today also shows obstructive lung disease consistent with probable fixed asthma.  Possible that his true asthma only flares under certain circumstances with certain exposures.  He does have albuterol available to use if needed.  Depending on course we may decide to try an ICS/LABA at some point.  We reviewed your pulmonary function testing today. Keep albuterol  available to use 2 puffs when needed for shortness of breath, wheezing, spells of coughing that do not quickly resolve.  You may also want to try taking 2 puffs prior to exercise. Continue your Xyzal, fluticasone nasal spray and Astelin nasal spray the way you are using them. We will not restart Singulair Follow-up with the allergist as planned to see if you might be a candidate for immunotherapy (allergy shots) Depending on how your symptoms do going forward we may decide to retry treatment for esophageal reflux. Follow with Dr Lamonte Sakai in 6 months or sooner if you have any problems  Chronic rhinitis Continue your Xyzal, fluticasone nasal spray and Astelin nasal spray the way you are using them. We will not restart Singulair Follow-up with the allergist as planned to see if you might be a candidate for immunotherapy (allergy shots)  Hiatal hernia with GERD Depending on how his symptoms progress, cough progresses we may decide to retry empiric treatment for reflux.  Time spent 41 minutes  Baltazar Apo, MD, PhD 02/26/2021, 5:21 PM [facility] [phone] or if no answer before 7:00PM call [phone] For any issues after 7:00PM please call eLink [phone]

## 2021-02-26 NOTE — Assessment & Plan Note (Signed)
Continue your Xyzal, fluticasone nasal spray and Astelin nasal spray the way you are using them. We will not restart Singulair Follow-up with the allergist as planned to see if you might be a candidate for immunotherapy (allergy shots)

## 2021-02-27 ENCOUNTER — Telehealth: Payer: Self-pay | Admitting: Medical

## 2021-02-27 NOTE — Telephone Encounter (Signed)
Left message for patient to call back and schedule Medicare Annual Wellness Visit (AWV) in office.   If not able to come in office, please offer to do virtually or by telephone.  Left office number and my jabber #[phone].  Due for AWVI  Please schedule at anytime with Nurse Health Advisor.   

## 2021-03-01 ENCOUNTER — Other Ambulatory Visit: Payer: Self-pay | Admitting: *Deleted

## 2021-03-01 DIAGNOSIS — C9000 Multiple myeloma not having achieved remission: Secondary | ICD-10-CM

## 2021-03-01 MED ORDER — LENALIDOMIDE 5 MG PO CAPS: ORAL_CAPSULE | ORAL | 0 refills | Status: DC

## 2021-03-12 ENCOUNTER — Emergency Department (HOSPITAL_BASED_OUTPATIENT_CLINIC_OR_DEPARTMENT_OTHER)
Admission: EM | Admit: 2021-03-12 | Discharge: 2021-03-12 | Disposition: A | Discharge status: Home / Self Care | Payer: Medicare Other | Attending: Emergency Medicine | Admitting: Emergency Medicine

## 2021-03-12 ENCOUNTER — Other Ambulatory Visit: Payer: Self-pay

## 2021-03-12 ENCOUNTER — Encounter (HOSPITAL_BASED_OUTPATIENT_CLINIC_OR_DEPARTMENT_OTHER): Payer: Self-pay | Admitting: Emergency Medicine

## 2021-03-12 ENCOUNTER — Ambulatory Visit: Payer: Medicare Other | Admitting: Medical

## 2021-03-12 DIAGNOSIS — M79674 Pain in right toe(s): Secondary | ICD-10-CM | POA: Diagnosis present

## 2021-03-12 DIAGNOSIS — Z7901 Long term (current) use of anticoagulants: Secondary | ICD-10-CM | POA: Insufficient documentation

## 2021-03-12 DIAGNOSIS — Z79899 Other long term (current) drug therapy: Secondary | ICD-10-CM | POA: Diagnosis not present

## 2021-03-12 DIAGNOSIS — Z87891 Personal history of nicotine dependence: Secondary | ICD-10-CM | POA: Diagnosis not present

## 2021-03-12 DIAGNOSIS — M109 Gout, unspecified: Secondary | ICD-10-CM | POA: Insufficient documentation

## 2021-03-12 MED ORDER — CEPHALEXIN 500 MG PO CAPS: 500.0000 mg | ORAL_CAPSULE | Freq: Four times a day (QID) | ORAL | 0 refills | Status: AC

## 2021-03-12 MED ORDER — COLCHICINE 0.6 MG PO TABS: 0.6000 mg | ORAL_TABLET | Freq: Every day | ORAL | 0 refills | Status: DC

## 2021-03-12 NOTE — Discharge Instructions (Addendum)
He was seen and evaluated in the emergency department today for further evaluation of right toe pain.  As we discussed, we will be treating you for both gout and possible cellulitis.  Keflex will be prescribed for possible cellulitis and Colchicine for gout.  Return to the emergency department for worsening pain, increased swelling, redness, fever, chills, or any other concerns you might have.  Please take 2 tablets of colchicine today followed by one pill every 12 hours until the resolution of your pain or you begin experiencing diarrhea.  Please take Keflex 4 times per day with food to its entirety.

## 2021-03-12 NOTE — ED Provider Notes (Signed)
[facility] EMERGENCY DEPARTMENT Provider Note   CSN: [id number] Arrival date & time: 03/12/21  1055     History Chief Complaint  Patient presents with   Foot Pain    Jorge [name] is a 65 y.o. male with history of multiple myeloma on oral chemotherapy and right foot cellulitis who presents to the emergency department today for right great toe pain that began a couple of days ago.  He complains of right great toe pain that is worse with movement.  Currently rates it a 5 out of 10 in severity.  He notes a previous episode in the past which was initially thought to be gout but was admitted shortly after for cellulitis. No hx of diabetes or osteomyelitis. He denies fever, chills, chest pain, shortness of breath, and sore throat. Has not taken anything for his toe pain.    Foot Pain      Past Medical History:  Diagnosis Date   Allergy    Anxiety    Blood transfusion without reported diagnosis 2018   Bone metastasis ([place])    Chest cold 05/19/2016   productive cough  -- started on antibiotic   Chronic back pain    due to bone mets from myeloma   Cough    Depression    Diverticulitis    GERD (gastroesophageal reflux disease)    Hiatal hernia    History of chicken pox    History of concussion    age 9 -- no residual   History of DVT of lower extremity 03/21/2016  treated and completed w/ xarelto   per doppler left extensive occlusion common femoral, femoral, and popliteal veins and right partial occlusion common femoral and profunda femoral veins/  last doppler 06-04-2016 no evidence acute or chronic dvt noted either leg    History of radiation therapy 06/09/16-06/23/16   lower thoracic spine 25 Gy in 10 fractions   Mouth ulcers    secondary to radiation   Multiple myeloma ([place]) dx 02/22/2016 via bone marrow bx---  oncologist-  [name]   IgG Kappa-- Hyperdiploid/ +11 w/ bone mets--  current treatment chemotherapy (started 08/ 2017)and pallitive radiation to back  started 06-09-2016   Renal calculus, right    Wears contact lenses     Patient Active Problem List   Diagnosis Date Noted   Chronic rhinitis 01/22/2021   Chronic cough 11/15/2020   Hiatal hernia with GERD 08/13/2018   Depression 07/30/2018   Anxiety 07/30/2018   Infection due to human metapneumovirus (hMPV) 02/04/2018   Neutropenia with fever ([place]) 02/02/2018   SOB (shortness of breath)    Pancytopenia ([place]) 02/01/2018   Bacteremia 11/10/2017   Sepsis ([place]) 11/09/2017   Thrombocytopenia ([place]) 11/09/2017   Iron deficiency anemia due to chronic blood loss    Neutropenia ([facility]) 03/02/2017   Cellulitis 03/01/2017   Phlebitis or thrombophlebitis of lower extremity 07/21/2016   Visit for preventive health examination 05/16/2016   Aphthous ulcer 05/16/2016   Ureterolithiasis 05/16/2016   Acute deep vein thrombosis (DVT) of femoral vein of left lower extremity ([place]) 03/21/2016   Multiple myeloma ([place]) 02/22/2016   Multiple myeloma not having achieved remission ([place]) 02/22/2016   Bilateral inguinal hernia (BIH), s/p lap repair 12/16/2013 11/02/2013    Past Surgical History:  Procedure Laterality Date   COLONOSCOPY  2008,2013   COLONOSCOPY  01/07/2017   Dr Penelope Coop, [facility]   CYSTOSCOPY W/ URETERAL STENT PLACEMENT Right 06/20/2016   Procedure: CYSTOSCOPY WITH STENT REPLACEMENT;  Surgeon: Elta Guadeloupe  Karsten Ro, MD;  Location: [facility];  Service: Urology;  Laterality: Right;   CYSTOSCOPY WITH RETROGRADE PYELOGRAM, URETEROSCOPY AND STENT PLACEMENT Right 05/30/2016   Procedure: CYSTOSCOPY WITH RETROGRADE PYELOGRAM, URETEROSCOPY AND STENT PLACEMENT,DILITATION URETERAL STRICTURE;  Surgeon: Kathie Rhodes, MD;  Location: [facility];  Service: Urology;  Laterality: Right;   CYSTOSCOPY/RETROGRADE/URETEROSCOPY/STONE EXTRACTION WITH BASKET Right 06/20/2016   Procedure: CYSTOSCOPY/URETEROSCOPY/STONE EXTRACTION WITH BASKET;  Surgeon: Kathie Rhodes, MD;  Location: [facility];  Service:  Urology;  Laterality: Right;   HIATAL HERNIA REPAIR N/A 08/13/2018   Procedure: LAPAROSCOPIC REPAIR OF HIATAL HERNIA REPAIR WITH FUNDOPLICATION AND INSERTION OF MESH;  Surgeon: Excell Seltzer, MD;  Location: [facility];  Service: General;  Laterality: N/A;   HOLMIUM LASER APPLICATION Right 55/01/3219   Procedure: HOLMIUM LASER APPLICATION;  Surgeon: Kathie Rhodes, MD;  Location: [facility];  Service: Urology;  Laterality: Right;   IR GENERIC HISTORICAL  02/11/2016   IR RADIOLOGIST EVAL & MGMT 02/11/2016 [facility]   IR GENERIC HISTORICAL  02/15/2016   IR BONE TUMOR(S)RF ABLATION 02/15/2016 Luanne Bras, MD [facility]   IR GENERIC HISTORICAL  02/15/2016   IR BONE TUMOR(S)RF ABLATION 02/15/2016 Luanne Bras, MD [facility]   IR GENERIC HISTORICAL  02/15/2016   IR BONE TUMOR(S)RF ABLATION 02/15/2016 Luanne Bras, MD [facility]   IR GENERIC HISTORICAL  02/15/2016   IR KYPHO THORACIC WITH BONE BIOPSY 02/15/2016 Luanne Bras, MD [facility]   IR GENERIC HISTORICAL  02/15/2016   IR KYPHO THORACIC WITH BONE BIOPSY 02/15/2016 Luanne Bras, MD [facility]   IR GENERIC HISTORICAL  02/15/2016   IR VERTEBROPLASTY CERV/THOR BX INC UNI/BIL INC/INJECT/IMAGING 02/15/2016 Luanne Bras, MD [facility]   IR GENERIC HISTORICAL  03/13/2016   IR KYPHO EA ADDL LEVEL THORACIC OR LUMBAR 03/13/2016 Luanne Bras, MD [facility]   IR GENERIC HISTORICAL  03/13/2016   IR KYPHO EA ADDL LEVEL THORACIC OR LUMBAR 03/13/2016 Luanne Bras, MD [facility]   IR GENERIC HISTORICAL  03/13/2016   IR BONE TUMOR(S)RF ABLATION 03/13/2016 Luanne Bras, MD [facility]   IR GENERIC HISTORICAL  03/13/2016   IR KYPHO LUMBAR INC FX REDUCE BONE BX UNI/BIL CANNULATION INC/IMAGING 03/13/2016 Luanne Bras, MD [facility]   IR GENERIC HISTORICAL  03/13/2016   IR BONE TUMOR(S)RF ABLATION 03/13/2016 Luanne Bras, MD [facility]   IR GENERIC HISTORICAL  03/13/2016   IR BONE TUMOR(S)RF  ABLATION 03/13/2016 Luanne Bras, MD [facility]   IR GENERIC HISTORICAL  03/31/2016   IR RADIOLOGIST EVAL & MGMT 03/31/2016 [facility]   KYPHOPLASTY     02/2016, 03/2016, 11/2016   LAPAROSCOPIC INGUINAL HERNIA REPAIR Bilateral 12-16-2013  dr gross   RADIOLOGY WITH ANESTHESIA N/A 02/15/2016   Procedure: Spinal Ablation;  Surgeon: Luanne Bras, MD;  Location: [facility];  Service: Radiology;  Laterality: N/A;   RADIOLOGY WITH ANESTHESIA N/A 03/13/2016   Procedure: LUMBER ABLATION;  Surgeon: Luanne Bras, MD;  Location: [facility];  Service: Radiology;  Laterality: N/A;   ROTATOR CUFF REPAIR Right 2003   spine operation     x1 in 2018 x3 in 2017   TONSILLECTOMY  age 57   VERTEBROPLASTY  01/2016   WISDOM TOOTH EXTRACTION         Family History  Problem Relation Age of Onset   Uterine cancer Mother    Ovarian cancer Mother    Colon cancer Mother        said it was rectal or colon but not sure  Rectal cancer Mother    Heart disease Father    Hypertension Father    Multiple sclerosis Sister    Paranoid behavior Brother    Drug abuse Brother    Schizophrenia Brother    Stroke Maternal Grandfather    Cancer Maternal Aunt    Leukemia Paternal Aunt    Healthy Son        x1   Healthy Daughter        x2   Allergies Daughter        x1   Diabetes Neg Hx    Alzheimer's disease Neg Hx    Parkinson's disease Neg Hx    Esophageal cancer Neg Hx    Stomach cancer Neg Hx     Social History   Tobacco Use   Smoking status: Former    Packs/day: 1.00    Years: 7.00    Pack years: 7.00    Types: Cigarettes    Quit date: 11/02/1980    Years since quitting: 40.3   Smokeless tobacco: Never  Vaping Use   Vaping Use: Never used  Substance Use Topics   Alcohol use: Yes    Comment: occasional   Drug use: No    Home Medications Prior to Admission medications   Medication Sig Start Date End Date Taking? Authorizing Provider  cephALEXin (KEFLEX) 500 MG capsule Take 1 capsule (500  mg total) by mouth 4 (four) times daily for 7 days. 03/12/21 03/19/21 Yes Kyleigha Markert M, PA-C  colchicine 0.6 MG tablet Take 1 tablet (0.6 mg total) by mouth daily. 03/12/21  Yes Raul Del, Amya Hlad M, PA-C  acetaminophen (TYLENOL) 325 MG tablet Take 2 tablets (650 mg total) by mouth every 6 (six) hours as needed for mild pain (or Fever >/= 101). Patient taking differently: Take 325 mg by mouth every 6 (six) hours as needed for mild pain (or Fever >/= 101). 02/04/18   Eugenie Filler, MD  albuterol (VENTOLIN HFA) 108 (90 Base) MCG/ACT inhaler TAKE 2 PUFFS BY MOUTH EVERY 6 HOURS AS NEEDED FOR WHEEZE OR SHORTNESS OF BREATH 06/04/20   [name], MD  amLODipine (NORVASC) 5 MG tablet Take by mouth. 03/21/20   [provider]  amoxicillin (AMOXIL) 500 MG capsule 2 tabs or capsules po prior to dental procedure. 02/01/21   Saguier, Percell Miller, PA-C  azelastine (ASTELIN) 0.1 % nasal spray Place 2 sprays into both nostrils 2 (two) times daily. Use in each nostril as directed 10/02/20   Saguier, Percell Miller, PA-C  benzonatate (TESSALON) 100 MG capsule Take 1 capsule (100 mg total) by mouth 3 (three) times daily as needed for cough. 12/18/20   Saguier, Percell Miller, PA-C  cetirizine (ZYRTEC) 10 MG tablet Take 10 mg by mouth daily as needed for allergies.    [provider]  Cholecalciferol (VITAMIN D3) 250 MCG (10000 UT) capsule Take 10,000 Units by mouth daily.    [provider]  fluorouracil (EFUDEX) 5 % cream APPLY TO AFFECTED AREA TWICE A DAY 08/31/19   [provider]  fluticasone (FLONASE) 50 MCG/ACT nasal spray SPRAY 2 SPRAYS INTO EACH NOSTRIL EVERY DAY 04/02/20   Saguier, Percell Miller, PA-C  guaiFENesin-codeine 100-10 MG/5ML syrup Take 10 mLs by mouth every 6 (six) hours as needed for cough. 11/15/20   Collene Gobble, MD  lenalidomide (REVLIMID) 5 MG capsule TAKE 1 CAPSULE BY MOUTH  DAILY FOR 21 DAYS, THEN 7  DAYS OFF Josem Kaufmann #[id number] 03/01/21   [name], MD  levocetirizine (XYZAL) 5 MG  tablet TAKE 1 TABLET BY MOUTH EVERY DAY IN THE EVENING 02/20/21   Saguier, Percell Miller, PA-C  lidocaine (LIDODERM) 5 % Place 1 patch onto the skin daily as needed (pain).  12/19/16   [provider]  Loperamide HCl (IMODIUM PO) Take by mouth as needed.    [provider]  Molnupiravir 200 MG CAPS Take 4 capsules (800 mg total) by mouth 2 (two) times daily. 12/18/20   Saguier, Percell Miller, PA-C  OVER THE COUNTER MEDICATION Take 1 capsule by mouth daily. methylcare otc supplement    [provider]  pantoprazole (PROTONIX) 20 MG tablet TAKE 1 TABLET BY MOUTH  DAILY 08/19/19   Cirigliano, Vito V, DO  Probiotic CAPS Take 1 capsule by mouth daily.    [provider]  SYMBICORT 160-4.5 MCG/ACT inhaler TAKE 2 PUFFS BY MOUTH TWICE A DAY 07/16/20   Saguier, Percell Miller, PA-C  valsartan (DIOVAN) 160 MG tablet Take 1 tablet (160 mg total) by mouth daily. 11/15/20   Collene Gobble, MD  Vitamins-Lipotropics (LIPO-FLAVONOID PLUS PO) Take 1 tablet by mouth daily.    [provider]  XARELTO 10 MG TABS tablet Take 1 tablet (10 mg total) by mouth daily. 07/05/20   [name], MD  zolpidem (AMBIEN) 5 MG tablet Take 1 tablet (5 mg total) by mouth at bedtime. 05/29/20   Cottle, Billey Co., MD    Allergies    Patient has no known allergies.  Review of Systems   Review of Systems  All other systems reviewed and are negative.  Physical Exam Updated Vital Signs BP (!) 129/92 (BP Location: Left Arm)   Pulse 66   Temp 98.5 F (36.9 C) (Oral)   Resp 18   Ht '5\' 7"'  (1.702 m)   Wt 83.9 kg   SpO2 98%   BMI 28.98 kg/m   Physical Exam Vitals reviewed.  Constitutional:      Appearance: Normal appearance.  HENT:     Head: Normocephalic and atraumatic.  Eyes:     General:        Right eye: No discharge.        Left eye: No discharge.     Conjunctiva/sclera: Conjunctivae normal.  Pulmonary:     Effort: Pulmonary effort is normal.  Musculoskeletal:     Comments: There is warmth,  swelling, and tenderness to the first right metatarsophalangeal joint.  Skin:    General: Skin is warm and dry.     Findings: No rash.  Neurological:     General: No focal deficit present.     Mental Status: He is alert.  Psychiatric:        Mood and Affect: Mood normal.        Behavior: Behavior normal.    ED Results / Procedures / Treatments   Labs (all labs ordered are listed, but only abnormal results are displayed) Labs Reviewed - No data to display  EKG None  Radiology No results found.  Procedures Procedures   Medications Ordered in ED Medications - No data to display  ED Course  I have reviewed the triage vital signs and the nursing notes.  Pertinent labs & imaging results that were available during my care of the patient were reviewed by me and considered in my medical decision making (see chart for details).    MDM Rules/Calculators/A&P                         Jorge [name]  is a 65 year old male with history of multiple myeloma on oral chemotherapy who presents to the emergency department today for further evaluation of right great toe pain.  He has no history of diabetes.  I am less concerned for necrotizing infection.  Physical exam was concerning for gout over the right great toe.  However, with his history of multiple myeloma on chemotherapy, I will also provide antibiotics for cellulitis.  He is hemodynamically stable in the department.  He has not been febrile.  No other signs of systemic infection.  He is safe for discharge.  We will have him follow-up with his primary care provider.  Strict return precautions given.  Final Clinical Impression(s) / ED Diagnoses Final diagnoses:  Podagra    Rx / DC Orders ED Discharge Orders          Ordered    cephALEXin (KEFLEX) 500 MG capsule  4 times daily        03/12/21 1212    colchicine 0.6 MG tablet  Daily        03/12/21 [address] [place], [place] 03/12/21 1403    Noemi Chapel,  MD 03/23/21 [phone]

## 2021-03-12 NOTE — ED Provider Notes (Signed)
R great toe pain and redness at [facility] - hx of cellulitis of foot - on meds for Mult Myeloma - on exam, has mild redness and ttp with ROM, no fever - possible podagra vs celluiltis - VS without fever.  Home with meds appropriate for same.  Pt agreeable to plan.  Medical screening examination/treatment/procedure(s) were conducted as a shared visit with non-physician practitioner(s) and myself.  I personally evaluated the patient during the encounter.  Clinical Impression:   Final diagnoses:  Franchot Mimes, MD 03/23/21 [phone]

## 2021-03-12 NOTE — ED Triage Notes (Signed)
Reports right foot pain with swelling for the last few days.  Feels like it may be cellulitis.  Denies any fevers.

## 2021-03-26 ENCOUNTER — Encounter: Payer: Self-pay | Admitting: Allergy

## 2021-03-26 ENCOUNTER — Other Ambulatory Visit: Payer: Self-pay

## 2021-03-26 ENCOUNTER — Ambulatory Visit (INDEPENDENT_AMBULATORY_CARE_PROVIDER_SITE_OTHER): Payer: Medicare Other | Admitting: Allergy

## 2021-03-26 VITALS — BP 130/80 | HR 75 | Temp 96.6°F | Resp 22 | Ht 67.0 in | Wt 185.4 lb

## 2021-03-26 DIAGNOSIS — J189 Pneumonia, unspecified organism: Secondary | ICD-10-CM | POA: Diagnosis not present

## 2021-03-26 DIAGNOSIS — R053 Chronic cough: Secondary | ICD-10-CM

## 2021-03-26 DIAGNOSIS — B999 Unspecified infectious disease: Secondary | ICD-10-CM | POA: Insufficient documentation

## 2021-03-26 DIAGNOSIS — J3089 Other allergic rhinitis: Secondary | ICD-10-CM | POA: Diagnosis not present

## 2021-03-26 MED ORDER — IPRATROPIUM BROMIDE 0.03 % NA SOLN: 1.0000 | Freq: Two times a day (BID) | NASAL | 5 refills | Status: DC | PRN

## 2021-03-26 MED ORDER — MONTELUKAST SODIUM 10 MG PO TABS: 10.0000 mg | ORAL_TABLET | Freq: Every day | ORAL | 5 refills | Status: DC

## 2021-03-26 NOTE — Patient Instructions (Addendum)
Today's skin testing showed: Positive to perennial mold #4 and dust mites.   Rhinitis: Use Atrovent (ipratropium) 0.03% 1-2 sprays per nostril twice a day as needed for runny nose/drainage. Use Flonase (fluticasone) nasal spray 1 spray per nostril twice a day as needed for nasal congestion.  Nasal saline spray (i.e., Simply Saline) or nasal saline lavage (i.e., NeilMed) is recommended as needed and prior to medicated nasal sprays.  Start environmental control measures as below. Start Singulair (montelukast) '10mg'$  daily at night. Cautioned that in some children/adults can experience behavioral changes including hyperactivity, agitation, depression, sleep disturbances and suicidal ideations. These side effects are rare, but if you notice them you should notify me and discontinue Singulair (montelukast). Use over the counter antihistamines such as Zyrtec (cetirizine), Claritin (loratadine), Allegra (fexofenadine), or Xyzal (levocetirizine) daily as needed. May take twice a day during allergy flares. May switch antihistamines every few months.  Breathing: Follows with pulmonology. Daily controller medication(s): none. During upper respiratory infections/asthma flares: start Symbicort 176mg 2 puffs twice a day and rinse mouth after each use for 1-2 weeks until your breathing symptoms return to baseline.  May use albuterol rescue inhaler 2 puffs every 4 to 6 hours as needed for shortness of breath, chest tightness, coughing, and wheezing. May use albuterol rescue inhaler 2 puffs 5 to 15 minutes prior to strenuous physical activities. Monitor frequency of use.  Breathing control goals:  Full participation in all desired activities (may need albuterol before activity) Albuterol use two times or less a week on average (not counting use with activity) Cough interfering with sleep two times or less a month Oral steroids no more than once a year No hospitalizations   Infections: Keep track of  infections and antibiotics use.  Follow up in 2 months or sooner if needed.    Control of House Dust Mite Allergen Dust mite allergens are a common trigger of allergy and asthma symptoms. While they can be found throughout the house, these microscopic creatures thrive in warm, humid environments such as bedding, upholstered furniture and carpeting. Because so much time is spent in the bedroom, it is essential to reduce mite levels there.  Encase pillows, mattresses, and box springs in special allergen-proof fabric covers or airtight, zippered plastic covers.  Bedding should be washed weekly in hot water (130 F) and dried in a hot dryer. Allergen-proof covers are available for comforters and pillows that can't be regularly washed.  Wash the allergy-proof covers every few months. Minimize clutter in the bedroom. Keep pets out of the bedroom.  Keep humidity less than 50% by using a dehumidifier or air conditioning. You can buy a humidity measuring device called a hygrometer to monitor this.  If possible, replace carpets with hardwood, linoleum, or washable area rugs. If that's not possible, vacuum frequently with a vacuum that has a HEPA filter. Remove all upholstered furniture and non-washable window drapes from the bedroom. Remove all non-washable stuffed toys from the bedroom.  Wash stuffed toys weekly.  Mold Control Mold and fungi can grow on a variety of surfaces provided certain temperature and moisture conditions exist.  Outdoor molds grow on plants, decaying vegetation and soil. The major outdoor mold, Alternaria and Cladosporium, are found in very high numbers during hot and dry conditions. Generally, a late summer - fall peak is seen for common outdoor fungal spores. Rain will temporarily lower outdoor mold spore count, but counts rise rapidly when the rainy period ends. The most important indoor molds are Aspergillus and Penicillium. Dark,  humid and poorly ventilated basements are ideal  sites for mold growth. The next most common sites of mold growth are the bathroom and the kitchen. Outdoor (Seasonal) Mold Control Use air conditioning and keep windows closed. Avoid exposure to decaying vegetation. Avoid leaf raking. Avoid grain handling. Consider wearing a face mask if working in moldy areas.  Indoor (Perennial) Mold Control  Maintain humidity below 50%. Get rid of mold growth on hard surfaces with water, detergent and, if necessary, 5% bleach (do not mix with other cleaners). Then dry the area completely. If mold covers an area more than 10 square feet, consider hiring an indoor environmental professional. For clothing, washing with soap and water is best. If moldy items cannot be cleaned and dried, throw them away. Remove sources e.g. contaminated carpets. Repair and seal leaking roofs or pipes. Using dehumidifiers in damp basements may be helpful, but empty the water and clean units regularly to prevent mildew from forming. All rooms, especially basements, bathrooms and kitchens, require ventilation and cleaning to deter mold and mildew growth. Avoid carpeting on concrete or damp floors, and storing items in damp areas.   Buffered Isotonic Saline Irrigations:  Goal: When you irrigate with the isotonic saline (salt water) it washes mucous and other debris from your nose that could be contributing to your nasal symptoms.   Recipe: Obtain 1 quart jar that is clean Fill with clean (bottled, boiled or distilled) water Add 1-2 heaping teaspoons of salt without iodine If the solution with 2 teaspoons of salt is too strong, adjust the amount down until better tolerated Add 1 teaspoon of [employer] baking soda (pure bicarbonate) Mix ingredients together and store at room temperature and discard after 1 week * Alternatively you can buy pre made salt packets for the NeilMed bottle or there          are other over the counter brands available  Instructions: Warm  cup of  the solution in the microwave if desired but be careful not to overheat as this will burn the inside of your nose Stand over a sink (or do it while you shower) and squirt the solution into one side of your nose aiming towards the back of your head Sometimes saying "coca cola" while irrigating can be helpful to prevent fluid from going down your throat  The solution will travel to the back of your nose and then come out the other side Perform this again on the other side Try to do this twice a day If you are using a nasal spray in addition to the irrigation, irrigate first and then use the topical nasal spray otherwise you will wash the nasal spray out of your nose

## 2021-03-26 NOTE — Assessment & Plan Note (Addendum)
Perennial rhinitis symptoms for the last 5 years with worsening coughing in the mornings.  Tried Flonase and Astelin with good benefit.  No benefit with antihistamines and Singulair that he recalls.  No prior allergy/ENT evaluation.   Today's skin testing showed: Positive to perennial mold #4 and dust mites.   Discussed with patient that I'm not sure how much of the above allergens are contributing to his symptoms.   Use Atrovent (ipratropium) 0.03% 1-2 sprays per nostril twice a day as needed for runny nose/drainage. . Use Flonase (fluticasone) nasal spray 1 spray per nostril twice a day as needed for nasal congestion.   Nasal saline spray (i.e., Simply Saline) or nasal saline lavage (i.e., NeilMed) is recommended as needed and prior to medicated nasal sprays.  Start environmental control measures as below.  Start Singulair (montelukast) '10mg'$  daily at night.  Cautioned that in some children/adults can experience behavioral changes including hyperactivity, agitation, depression, sleep disturbances and suicidal ideations. These side effects are rare, but if you notice them you should notify me and discontinue Singulair (montelukast).  Use over the counter antihistamines such as Zyrtec (cetirizine), Claritin (loratadine), Allegra (fexofenadine), or Xyzal (levocetirizine) daily as needed. May take twice a day during allergy flares. May switch antihistamines every few months.  If no improvement will refer to ENT next and/or recommend a trial of allergy immunotherapy.

## 2021-03-26 NOTE — Progress Notes (Signed)
New Patient Note  RE: Jorge [name] MRN: [id number] DOB: 1956/04/30 Date of Office Visit: 03/26/2021  Consult requested by: Collene Gobble, MD Primary care provider: Mackie Pai, PA-C  Chief Complaint: Nasal Congestion (Since last year constant year round draining from head does see pulmonologist he believed it was asthma)  History of Present Illness: I had the pleasure of seeing Jorge [name] for initial evaluation at the [facility] on 03/26/2021. He is a 65 y.o. male, who is referred here by Mackie Pai, PA-C for the evaluation of chronic cough and nasal congestion.  He reports symptoms of rhinorrhea, PND, throat clearing, coughing in the mornings, sometimes nasal congestion, little sneezing and itchy/watery eyes.  Symptoms have been going on for 5 years but worse the past year. The symptoms are present all year around with worsening in fall. Other triggers include exposure to unknown. Anosmia: no. Headache: no. He has used Astelin, Flonase with fair improvement in symptoms. Tried OTC antihistamines, Singulair with no benefit. Sinus infections: none. Previous work up includes: none. Previous ENT evaluation: no. Previous sinus imaging: no. History of nasal polyps: no. Last eye exam: last 6 months. History of reflux: tried PPI for 3 months with no benefit.  He reports symptoms of chest tightness, shortness of breath, coughing, wheezing for 5 years. Current medications include Symbicort prn and albuterol prn which help. He reports not using aerochamber with inhalers. He tried the following inhalers: none. Main triggers are infections, exercise. In the last month, frequency of symptoms: <1x/week. Frequency of nocturnal symptoms: 0x/month. Frequency of SABA use: <1x/week. Interference with physical activity: sometimes. Sleep is undisturbed. In the last 12 months, emergency room visits/urgent care visits/doctor office visits or hospitalizations due to respiratory  issues: once. In the last 12 months, oral steroids courses: one. Lifetime history of hospitalization for respiratory issues: yes for pneumonias. Prior intubations: yes for ? pneumonia. History of pneumonia: yes. He was evaluated by pulmonologist in the past. Smoking exposure: over 50 years ago. Up to date with flu vaccine: yes. Up to date with pneumonia vaccine: yes. Up to date with COVID-19 vaccine: yes. Prior Covid-19 infection: 2 months ago.  Patient is being treated for multiple myeloma x 5 yrs.   01/22/2021 pulmonology visit: "65 yo man, former tobacco (7 pk-yrs), History of multiple myeloma on therapy w transplant 2018, allergic rhinitis, GERD with a hiatal hernia, lower extremity DVT, chronic back pain.  He is referred today for evaluation of cough.   Started to become very persistent about 2 yrs ago. Brought on by URI's. Has PND, still has some but less on astelin NS, xyzal, currently off flonase. No GERD sx. He has been on lisinopril for over a year   CXR 09/13/20, elevated R HD, some R basilar atx  Ct chest 09/13/2020 reviewed by me shows no pulmonary embolism, chronically elevated right hemidiaphragm with some associated right basilar atelectasis, scattered areas of subsegmental atelectasis versus infiltrate   ROV 01/22/21 --follow-up visit for 65 year old man with a minimal tobacco history, multiple myeloma with transplant 2018, allergic rhinitis, GERD with hiatal hernia.  Also with a history of lower extremity DVT.  I saw him in May for evaluation of chronic cough. At his last visit I changed his lisinopril to valsartan, continued Xyzal and Astelin nasal spray, restarted fluticasone nasal spray. He added protonix for 3 weeks without any effect.  Has symbicort but does not use with any regularity  Hiatal hernia with GERD He tried starting Protonix for  3 weeks to see if this would impact his cough.  No real change.  He is now off of the PPI.  We may decide to revisit, restart at some point  going forward depending on progress.   Chronic cough Improved since we changed his ACE inhibitor to ARB but persists.  Still has significant rhinitis which would appear to be the biggest contributor.  Unclear how much role GERD is playing.  He did do a trial of PPI for 3 weeks.  We will work to control his rhinitis more effectively.  I think he needs PFT to ensure no evidence of occult obstructive lung disease.  If he continues to cough then will recommend bronchoscopy and airway inspection.   Chronic rhinitis Increase your fluticasone nasal spray to 2 sprays each nostril twice a day.  Try to avoid taking it right before bedtime You can try using your Astelin nasal spray, 2 sprays each nostril 2-3 times daily as needed for flaring nasal drainage Continue Xyzal each evening We will start Singulair 10 mg each evening Try starting nasal saline rinses once daily We will refer you to Allergy to consider possible skin testing"  02/26/2021 PFT: "Volumes show coexisting restrictive lung disease. DLCO: Decreased diffusion capacity that corrects to the normal range when adjusted for the inhaled alveolar volume."  09/13/2020 CT chest: "IMPRESSION: 1. No evidence of pulmonary embolus. 2. Scattered areas of subsegmental atelectasis without acute airspace disease. 3. Hepatic steatosis. 4. Chronic elevation right hemidiaphragm. 5. Stable lytic lesions left first and second ribs consistent with history of multiple myeloma. 6. Stable hiatal hernia."  Assessment and Plan: [name] is a 65 y.o. male with: Other allergic rhinitis Perennial rhinitis symptoms for the last 5 years with worsening coughing in the mornings.  Tried Flonase and Astelin with good benefit.  No benefit with antihistamines and Singulair that he recalls.  No prior allergy/ENT evaluation.  Today's skin testing showed: Positive to perennial mold #4 and dust mites.  Discussed with patient that I'm not sure how much of the above allergens  are contributing to his symptoms.  Use Atrovent (ipratropium) 0.03% 1-2 sprays per nostril twice a day as needed for runny nose/drainage. Use Flonase (fluticasone) nasal spray 1 spray per nostril twice a day as needed for nasal congestion.  Nasal saline spray (i.e., Simply Saline) or nasal saline lavage (i.e., NeilMed) is recommended as needed and prior to medicated nasal sprays. Start environmental control measures as below. Start Singulair (montelukast) 24m daily at night. Cautioned that in some children/adults can experience behavioral changes including hyperactivity, agitation, depression, sleep disturbances and suicidal ideations. These side effects are rare, but if you notice them you should notify me and discontinue Singulair (montelukast). Use over the counter antihistamines such as Zyrtec (cetirizine), Claritin (loratadine), Allegra (fexofenadine), or Xyzal (levocetirizine) daily as needed. May take twice a day during allergy flares. May switch antihistamines every few months. If no improvement will refer to ENT next and/or recommend a trial of allergy immunotherapy.   Chronic cough Coughing, wheezing, shortness of breath and chest tightness for 5 years.  Tried albuterol and Symbicort as needed with some benefit.  COVID-19 infection 2 months ago.  Being treated for multiple myeloma for the last 5 years.  Follows with pulmonology.  Tried PPI with no benefit. 2022 PFT showed restriction, 2022 CT chest showed atelectasis, hiatal hernia, lytic rib lesions. Follows with pulmonology. Daily controller medication(s): none. During upper respiratory infections/asthma flares: start Symbicort 1671m 2 puffs twice a day and rinse  mouth after each use for 1-2 weeks until your breathing symptoms return to baseline.  May use albuterol rescue inhaler 2 puffs every 4 to 6 hours as needed for shortness of breath, chest tightness, coughing, and wheezing. May use albuterol rescue inhaler 2 puffs 5 to 15 minutes  prior to strenuous physical activities. Monitor frequency of use.   Frequent episodes of pneumonia Frequent bouts of pneumonia requiring hospitalization and ? intubation in the past. Medical history significant for multiple myeloma.  Keep track of infections and antibiotics use. Consider getting basic immune evaluation bloodwork if getting frequent infections in future.   Return in about 2 months (around 05/26/2021).  Meds ordered this encounter  Medications   ipratropium (ATROVENT) 0.03 % nasal spray    Sig: Place 1-2 sprays into both nostrils 2 (two) times daily as needed (nasal drainage).    Dispense:  30 mL    Refill:  5   montelukast (SINGULAIR) 10 MG tablet    Sig: Take 1 tablet (10 mg total) by mouth at bedtime.    Dispense:  30 tablet    Refill:  5    Lab Orders  No laboratory test(s) ordered today    Other allergy screening: Food allergy: no Eggs cause diarrhea Medication allergy: no Hymenoptera allergy: no Urticaria: no Eczema:no History of recurrent infections suggestive of immunodeficency: no  Diagnostics: Skin Testing: Environmental allergy panel and select foods. Positive to perennial mold #4 and dust mites.  Results discussed with patient/family.  Airborne Adult Perc - 03/26/21 1004     Time Antigen Placed 1005    Allergen Manufacturer Lavella Hammock    Location Back    Number of Test 59    1. Control-Buffer 50% Glycerol Negative    2. Control-Histamine 1 mg/ml 2+    3. Albumin saline Negative    4. Terrebonne Negative    5. [place] Negative    6. Johnson Negative    7. [place] Blue Negative    8. Meadow Fescue Negative    9. Perennial Rye Negative    10. Sweet Vernal Negative    11. Timothy Negative    12. Cocklebur Negative    13. Burweed Marshelder Negative    14. Ragweed, short Negative    15. Ragweed, Giant Negative    16. Plantain,  English Negative    17. Lamb's Quarters Negative    18. Sheep Sorrell Negative    19. Rough Pigweed Negative    20.  Marsh Elder, Rough Negative    21. Mugwort, Common Negative    22. Ash mix Negative    23. Birch mix Negative    24. Beech American Negative    25. Box, Elder Negative    26. Cedar, red Negative    27. Cottonwood, [place] Negative    28. Elm mix Negative    29. Hickory Negative    30. Maple mix Negative    31. Oak, [place] mix Negative    32. Pecan Pollen Negative    33. Pine mix Negative    34. Sycamore Eastern Negative    35. Middle Island, Black Pollen Negative    36. Alternaria alternata Negative    37. Cladosporium Herbarum Negative    38. Aspergillus mix Negative    39. Penicillium mix Negative    40. Bipolaris sorokiniana (Helminthosporium) Negative    41. Drechslera spicifera (Curvularia) Negative    42. Mucor plumbeus Negative    43. Fusarium moniliforme Negative    44. Aureobasidium pullulans (pullulara) Negative  45. Rhizopus oryzae Negative    46. Botrytis cinera Negative    47. Epicoccum nigrum Negative    48. Phoma betae Negative    49. Candida Albicans Negative    50. Trichophyton mentagrophytes Negative    51. Mite, D Farinae  5,000 AU/ml Negative    52. Mite, D Pteronyssinus  5,000 AU/ml Negative    53. Cat Hair 10,000 BAU/ml Negative    54.  Dog Epithelia Negative    55. Mixed Feathers Negative    56. Horse Epithelia Negative    57. Cockroach, German Negative    58. Mouse Negative    59. Tobacco Leaf Negative             Food Perc - 03/26/21 1005       Test Information   Time Antigen Placed 1005    Allergen Manufacturer Lavella Hammock    Location Back    Number of allergen test 10      Food   1. Peanut Negative    2. Soybean food Negative    3. Wheat, whole Negative    4. Sesame Negative    5. Milk, cow Negative    6. Egg White, chicken Negative    7. Casein Negative    8. Shellfish mix Negative    9. Fish mix Negative    10. Cashew Negative             Intradermal - 03/26/21 1033     Time Antigen Placed 60    Allergen Manufacturer  Lavella Hammock    Location Back    Number of Test 15    Control Negative    Guatemala Negative    Johnson Negative    7 Grass Negative    Ragweed mix Negative    Weed mix Negative    Tree mix Negative    Mold 1 Negative    Mold 2 Negative    Mold 3 Negative    Mold 4 2+    Cat Negative    Dog Negative    Cockroach Negative    Mite mix 2+             Past Medical History: Patient Active Problem List   Diagnosis Date Noted   Other allergic rhinitis 03/26/2021   Frequent episodes of pneumonia 03/26/2021   Chronic rhinitis 01/22/2021   Chronic cough 11/15/2020   Hiatal hernia with GERD 08/13/2018   Depression 07/30/2018   Anxiety 07/30/2018   Infection due to human metapneumovirus (hMPV) 02/04/2018   Neutropenia with fever ([place]) 02/02/2018   SOB (shortness of breath)    Pancytopenia ([place]) 02/01/2018   Bacteremia 11/10/2017   Sepsis ([place]) 11/09/2017   Thrombocytopenia ([place]) 11/09/2017   Iron deficiency anemia due to chronic blood loss    Neutropenia ([place]) 03/02/2017   Cellulitis 03/01/2017   Phlebitis or thrombophlebitis of lower extremity 07/21/2016   Visit for preventive health examination 05/16/2016   Aphthous ulcer 05/16/2016   Ureterolithiasis 05/16/2016   Acute deep vein thrombosis (DVT) of femoral vein of left lower extremity ([place]) 03/21/2016   Multiple myeloma ([place]) 02/22/2016   Multiple myeloma not having achieved remission ([place]) 02/22/2016   Bilateral inguinal hernia (BIH), s/p lap repair 12/16/2013 11/02/2013   Past Medical History:  Diagnosis Date   Allergy    Anxiety    Blood transfusion without reported diagnosis 2018   Bone metastasis ([place])    Chest cold 05/19/2016   productive cough  -- started on antibiotic  Chronic back pain    due to bone mets from myeloma   Cough    Depression    Diverticulitis    GERD (gastroesophageal reflux disease)    Hiatal hernia    History of chicken pox    History of concussion    age 71 -- no residual   History of DVT  of lower extremity 03/21/2016  treated and completed w/ xarelto   per doppler left extensive occlusion common femoral, femoral, and popliteal veins and right partial occlusion common femoral and profunda femoral veins/  last doppler 06-04-2016 no evidence acute or chronic dvt noted either leg    History of radiation therapy 06/09/16-06/23/16   lower thoracic spine 25 Gy in 10 fractions   Mouth ulcers    secondary to radiation   Multiple myeloma ([place]) dx 02/22/2016 via bone marrow bx---  oncologist-  [name]   IgG Kappa-- Hyperdiploid/ +11 w/ bone mets--  current treatment chemotherapy (started 08/ 2017)and pallitive radiation to back started 06-09-2016   Renal calculus, right    Wears contact lenses    Past Surgical History: Past Surgical History:  Procedure Laterality Date   COLONOSCOPY  2008,2013   COLONOSCOPY  01/07/2017   Dr Penelope Coop, [facility]   CYSTOSCOPY W/ URETERAL STENT PLACEMENT Right 06/20/2016   Procedure: CYSTOSCOPY WITH STENT REPLACEMENT;  Surgeon: Kathie Rhodes, MD;  [facility];  Service: Urology;  Laterality: Right;   CYSTOSCOPY WITH RETROGRADE PYELOGRAM, URETEROSCOPY AND STENT PLACEMENT Right 05/30/2016   Procedure: CYSTOSCOPY WITH RETROGRADE PYELOGRAM, URETEROSCOPY AND STENT PLACEMENT,DILITATION URETERAL STRICTURE;  Surgeon: Kathie Rhodes, MD;  Location: [facility];  Service: Urology;  Laterality: Right;   CYSTOSCOPY/RETROGRADE/URETEROSCOPY/STONE EXTRACTION WITH BASKET Right 06/20/2016   Procedure: CYSTOSCOPY/URETEROSCOPY/STONE EXTRACTION WITH BASKET;  Surgeon: Kathie Rhodes, MD;  Location: [facility];  Service: Urology;  Laterality: Right;   HIATAL HERNIA REPAIR N/A 08/13/2018   Procedure: LAPAROSCOPIC REPAIR OF HIATAL HERNIA REPAIR WITH FUNDOPLICATION AND INSERTION OF MESH;  Surgeon: Excell Seltzer, MD;  Location: [facility];  Service: General;  Laterality: N/A;   HOLMIUM LASER APPLICATION Right 69/10/5036   Procedure: HOLMIUM LASER  APPLICATION;  Surgeon: Kathie Rhodes, MD;  Location: [facility];  Service: Urology;  Laterality: Right;   IR GENERIC HISTORICAL  02/11/2016   IR RADIOLOGIST EVAL & MGMT 02/11/2016 [facility]   IR GENERIC HISTORICAL  02/15/2016   IR BONE TUMOR(S)RF ABLATION 02/15/2016 Luanne Bras, MD [facility]   IR GENERIC HISTORICAL  02/15/2016   IR BONE TUMOR(S)RF ABLATION 02/15/2016 Luanne Bras, MD [facility]   IR GENERIC HISTORICAL  02/15/2016   IR BONE TUMOR(S)RF ABLATION 02/15/2016 Luanne Bras, MD [facility]   IR GENERIC HISTORICAL  02/15/2016   IR KYPHO THORACIC WITH BONE BIOPSY 02/15/2016 Luanne Bras, MD [facility]   IR GENERIC HISTORICAL  02/15/2016   IR KYPHO THORACIC WITH BONE BIOPSY 02/15/2016 Luanne Bras, MD [facility]   IR GENERIC HISTORICAL  02/15/2016   IR VERTEBROPLASTY CERV/THOR BX INC UNI/BIL INC/INJECT/IMAGING 02/15/2016 Luanne Bras, MD [facility]   IR GENERIC HISTORICAL  03/13/2016   IR KYPHO EA ADDL LEVEL THORACIC OR LUMBAR 03/13/2016 Luanne Bras, MD [facility]   IR GENERIC HISTORICAL  03/13/2016   IR KYPHO EA ADDL LEVEL THORACIC OR LUMBAR 03/13/2016 Luanne Bras, MD [facility]   IR GENERIC HISTORICAL  03/13/2016   IR BONE TUMOR(S)RF ABLATION 03/13/2016 Luanne Bras, MD [facility]   IR GENERIC HISTORICAL  03/13/2016   IR  KYPHO LUMBAR INC FX REDUCE BONE BX UNI/BIL CANNULATION INC/IMAGING 03/13/2016 Luanne Bras, MD [facility]   IR GENERIC HISTORICAL  03/13/2016   IR BONE TUMOR(S)RF ABLATION 03/13/2016 Luanne Bras, MD [facility]   IR GENERIC HISTORICAL  03/13/2016   IR BONE TUMOR(S)RF ABLATION 03/13/2016 Luanne Bras, MD [facility]   IR GENERIC HISTORICAL  03/31/2016   IR RADIOLOGIST EVAL & MGMT 03/31/2016 [facility]   KYPHOPLASTY     02/2016, 03/2016, 11/2016   LAPAROSCOPIC INGUINAL HERNIA REPAIR Bilateral 12-16-2013  dr gross   RADIOLOGY WITH ANESTHESIA N/A 02/15/2016   Procedure: Spinal Ablation;   Surgeon: Luanne Bras, MD;  Location: [facility];  Service: Radiology;  Laterality: N/A;   RADIOLOGY WITH ANESTHESIA N/A 03/13/2016   Procedure: LUMBER ABLATION;  Surgeon: Luanne Bras, MD;  Location: [facility];  Service: Radiology;  Laterality: N/A;   ROTATOR CUFF REPAIR Right 2003   spine operation     x1 in 2018 x3 in 2017   TONSILLECTOMY  age 78   VERTEBROPLASTY  01/2016   WISDOM TOOTH EXTRACTION     Medication List:  Current Outpatient Medications  Medication Sig Dispense Refill   acetaminophen (TYLENOL) 325 MG tablet Take 2 tablets (650 mg total) by mouth every 6 (six) hours as needed for mild pain (or Fever >/= 101). (Patient taking differently: Take 325 mg by mouth every 6 (six) hours as needed for mild pain (or Fever >/= 101).)     albuterol (VENTOLIN HFA) 108 (90 Base) MCG/ACT inhaler TAKE 2 PUFFS BY MOUTH EVERY 6 HOURS AS NEEDED FOR WHEEZE OR SHORTNESS OF BREATH 6.7 each 1   amLODipine (NORVASC) 5 MG tablet Take by mouth.     benzonatate (TESSALON) 100 MG capsule Take 1 capsule (100 mg total) by mouth 3 (three) times daily as needed for cough. 30 capsule 0   cetirizine (ZYRTEC) 10 MG tablet Take 10 mg by mouth daily as needed for allergies.     Cholecalciferol (VITAMIN D3) 250 MCG (10000 UT) capsule Take 10,000 Units by mouth daily.     fluorouracil (EFUDEX) 5 % cream APPLY TO AFFECTED AREA TWICE A DAY     fluticasone (FLONASE) 50 MCG/ACT nasal spray SPRAY 2 SPRAYS INTO EACH NOSTRIL EVERY DAY 16 mL 1   guaiFENesin-codeine 100-10 MG/5ML syrup Take 10 mLs by mouth every 6 (six) hours as needed for cough. 240 mL 0   ipratropium (ATROVENT) 0.03 % nasal spray Place 1-2 sprays into both nostrils 2 (two) times daily as needed (nasal drainage). 30 mL 5   lenalidomide (REVLIMID) 5 MG capsule TAKE 1 CAPSULE BY MOUTH  DAILY FOR 21 DAYS, THEN 7  DAYS OFF Auth #-[id number] 21 capsule 0   lidocaine (LIDODERM) 5 % Place 1 patch onto the skin daily as needed (pain).   2   Loperamide HCl (IMODIUM PO)  Take by mouth as needed.     montelukast (SINGULAIR) 10 MG tablet Take 1 tablet (10 mg total) by mouth at bedtime. 30 tablet 5   OVER THE COUNTER MEDICATION Take 1 capsule by mouth daily. methylcare otc supplement     SYMBICORT 160-4.5 MCG/ACT inhaler TAKE 2 PUFFS BY MOUTH TWICE A DAY (Patient taking differently: Inhale 2 puffs into the lungs daily as needed.) 10.2 each 0   valsartan (DIOVAN) 160 MG tablet Take 1 tablet (160 mg total) by mouth daily. 30 tablet 3   XARELTO 10 MG TABS tablet Take 1 tablet (10 mg total) by mouth daily. 90 tablet 3  zolpidem (AMBIEN) 5 MG tablet Take 1 tablet (5 mg total) by mouth at bedtime. 30 tablet 5   Current Facility-Administered Medications  Medication Dose Route Frequency Provider Last Rate Last Admin   0.9 %  sodium chloride infusion   Intravenous PRN Saguier, Percell Miller, PA-C       Facility-Administered Medications Ordered in Other Visits  Medication Dose Route Frequency Provider Last Rate Last Admin   heparin lock flush 100 unit/mL  500 Units Intravenous Once Celso Amy, NP       sodium chloride flush (NS) 0.9 % injection 3 mL  3 mL Intravenous Once PRN Celso Amy, NP       Allergies: No Known Allergies Social History: Social History   Socioeconomic History   Marital status: Married    Spouse name: Not on file   Number of children: 3   Years of education: Not on file   Highest education level: Not on file  Occupational History   Not on file  Tobacco Use   Smoking status: Former    Packs/day: 1.00    Years: 7.00    Pack years: 7.00    Types: Cigarettes    Quit date: 11/02/1980    Years since quitting: 40.4   Smokeless tobacco: Never  Vaping Use   Vaping Use: Never used  Substance and Sexual Activity   Alcohol use: Yes    Comment: occasional   Drug use: No   Sexual activity: Never  Other Topics Concern   Not on file  Social History Narrative   Not on file   Social Determinants of Health   Financial Resource Strain: Not  on file  Food Insecurity: Not on file  Transportation Needs: Not on file  Physical Activity: Not on file  Stress: Not on file  Social Connections: Not on file   Lives in a house which is 70.65 years old. Smoking: from 1975-1981 Occupation: [employer] membership  Environmental History: Water Damage/mildew in the house: no Charity fundraiser in the family room: no Carpet in the bedroom: yes Heating: gas Cooling: heat pump Pet: yes 1 dog x 4 yrs  Family History: Family History  Problem Relation Age of Onset   Uterine cancer Mother    Ovarian cancer Mother    Colon cancer Mother        said it was rectal or colon but not sure   Rectal cancer Mother    Heart disease Father    Hypertension Father    Multiple sclerosis Sister    Paranoid behavior Brother    Drug abuse Brother    Schizophrenia Brother    Stroke Maternal Grandfather    Cancer Maternal Aunt    Leukemia Paternal 39    Healthy Son        x1   Healthy Daughter        x2   Allergies Daughter        x1   Diabetes Neg Hx    Alzheimer's disease Neg Hx    Parkinson's disease Neg Hx    Esophageal cancer Neg Hx    Stomach cancer Neg Hx    Review of Systems  Constitutional:  Negative for appetite change, chills, fever and unexpected weight change.  HENT:  Positive for congestion, postnasal drip and rhinorrhea.   Eyes:  Negative for itching.  Respiratory:  Positive for cough. Negative for chest tightness, shortness of breath and wheezing.   Cardiovascular:  Negative for chest pain.  Gastrointestinal:  Negative for abdominal  pain.  Genitourinary:  Negative for difficulty urinating.  Skin:  Negative for rash.  Allergic/Immunologic: Positive for environmental allergies. Negative for food allergies.  Neurological:  Negative for headaches.   Objective: BP 130/80   Pulse 75   Temp (!) 96.6 F (35.9 C) (Temporal)   Resp (!) 22   Ht '5\' 7"'  (1.702 m)   Wt 185 lb 6.4 oz (84.1 kg)   SpO2 94%   BMI 29.04 kg/m  Body mass index is  29.04 kg/m. Physical Exam Vitals and nursing note reviewed.  Constitutional:      Appearance: Normal appearance. He is well-developed.  HENT:     Head: Normocephalic and atraumatic.     Right Ear: Tympanic membrane and external ear normal.     Left Ear: Tympanic membrane and external ear normal.     Nose: Nose normal.     Mouth/Throat:     Mouth: Mucous membranes are moist.     Pharynx: Oropharynx is clear.  Eyes:     Conjunctiva/sclera: Conjunctivae normal.  Cardiovascular:     Rate and Rhythm: Normal rate and regular rhythm.     Heart sounds: Normal heart sounds. No murmur heard.   No friction rub. No gallop.  Pulmonary:     Effort: Pulmonary effort is normal.     Breath sounds: Normal breath sounds. No wheezing, rhonchi or rales.  Musculoskeletal:     Cervical back: Neck supple.  Skin:    General: Skin is warm.     Findings: No rash.  Neurological:     Mental Status: He is alert and oriented to person, place, and time.  Psychiatric:        Behavior: Behavior normal.  The plan was reviewed with the patient/family, and all questions/concerned were addressed.  It was my pleasure to see Leodis today and participate in his care. Please feel free to contact me with any questions or concerns.  Sincerely,  Rexene Alberts, DO Allergy & Immunology  [facility] office: [place] office: [phone]

## 2021-03-26 NOTE — Assessment & Plan Note (Signed)
>>  ASSESSMENT AND PLAN FOR FREQUENT EPISODES OF PNEUMONIA WRITTEN ON 03/26/2021 10:33 PM BY Garnet Sierras, DO  Frequent bouts of pneumonia requiring hospitalization and ? intubation in the past. Medical history significant for multiple myeloma.  [name] Keep track of infections and antibiotics use. . Consider getting basic immune evaluation bloodwork if getting frequent infections in future.

## 2021-03-26 NOTE — Assessment & Plan Note (Signed)
Coughing, wheezing, shortness of breath and chest tightness for 5 years.  Tried albuterol and Symbicort as needed with some benefit.  COVID-19 infection 2 months ago.  Being treated for multiple myeloma for the last 5 years.  Follows with pulmonology.  Tried PPI with no benefit. 2022 PFT showed restriction, 2022 CT chest showed atelectasis, hiatal hernia, lytic rib lesions. Jearld Shines with pulmonology. . Daily controller medication(s): none. . During upper respiratory infections/asthma flares: start Symbicort 136mg 2 puffs twice a day and rinse mouth after each use for 1-2 weeks until your breathing symptoms return to baseline.  . May use albuterol rescue inhaler 2 puffs every 4 to 6 hours as needed for shortness of breath, chest tightness, coughing, and wheezing. May use albuterol rescue inhaler 2 puffs 5 to 15 minutes prior to strenuous physical activities. Monitor frequency of use.

## 2021-03-26 NOTE — Assessment & Plan Note (Signed)
Frequent bouts of pneumonia requiring hospitalization and ? intubation in the past. Medical history significant for multiple myeloma.  [name] Keep track of infections and antibiotics use. . Consider getting basic immune evaluation bloodwork if getting frequent infections in future.

## 2021-03-27 ENCOUNTER — Encounter: Payer: Self-pay | Admitting: Allergy

## 2021-04-08 ENCOUNTER — Other Ambulatory Visit: Payer: Self-pay | Admitting: Hematology & Oncology

## 2021-04-08 ENCOUNTER — Other Ambulatory Visit: Payer: Self-pay

## 2021-04-08 DIAGNOSIS — C9000 Multiple myeloma not having achieved remission: Secondary | ICD-10-CM

## 2021-04-08 MED ORDER — LENALIDOMIDE 5 MG PO CAPS: ORAL_CAPSULE | ORAL | 0 refills | Status: DC

## 2021-04-11 ENCOUNTER — Inpatient Hospital Stay: Payer: Medicare Other | Attending: Hematology & Oncology

## 2021-04-11 ENCOUNTER — Inpatient Hospital Stay: Payer: Medicare Other

## 2021-04-11 ENCOUNTER — Inpatient Hospital Stay (HOSPITAL_BASED_OUTPATIENT_CLINIC_OR_DEPARTMENT_OTHER): Payer: Medicare Other | Admitting: Hematology & Oncology

## 2021-04-11 ENCOUNTER — Other Ambulatory Visit: Payer: Self-pay

## 2021-04-11 VITALS — BP 132/79 | HR 56 | Temp 98.2°F | Resp 18 | Wt 190.0 lb

## 2021-04-11 DIAGNOSIS — Z79899 Other long term (current) drug therapy: Secondary | ICD-10-CM | POA: Diagnosis not present

## 2021-04-11 DIAGNOSIS — Z86718 Personal history of other venous thrombosis and embolism: Secondary | ICD-10-CM | POA: Insufficient documentation

## 2021-04-11 DIAGNOSIS — C9000 Multiple myeloma not having achieved remission: Secondary | ICD-10-CM | POA: Insufficient documentation

## 2021-04-11 DIAGNOSIS — Z7901 Long term (current) use of anticoagulants: Secondary | ICD-10-CM | POA: Diagnosis not present

## 2021-04-11 DIAGNOSIS — D509 Iron deficiency anemia, unspecified: Secondary | ICD-10-CM | POA: Diagnosis not present

## 2021-04-11 DIAGNOSIS — Z9484 Stem cells transplant status: Secondary | ICD-10-CM | POA: Diagnosis not present

## 2021-04-11 LAB — CMP (CANCER CENTER ONLY)
ALT: 48 U/L — ABNORMAL HIGH (ref 0–44)
AST: 48 U/L — ABNORMAL HIGH (ref 15–41)
Albumin: 4.2 g/dL (ref 3.5–5.0)
Alkaline Phosphatase: 51 U/L (ref 38–126)
Anion gap: 9 (ref 5–15)
BUN: 11 mg/dL (ref 8–23)
CO2: 26 mmol/L (ref 22–32)
Calcium: 9.4 mg/dL (ref 8.9–10.3)
Chloride: 104 mmol/L (ref 98–111)
Creatinine: 0.95 mg/dL (ref 0.61–1.24)
GFR, Estimated: >60 mL/min (ref >60)
Glucose, Bld: 93 mg/dL (ref 70–99)
Potassium: 4.1 mmol/L (ref 3.5–5.1)
Sodium: 139 mmol/L (ref 135–145)
Total Bilirubin: 0.9 mg/dL (ref 0.3–1.2)
Total Protein: 7 g/dL (ref 6.5–8.1)

## 2021-04-11 LAB — CBC WITH DIFFERENTIAL (CANCER CENTER ONLY)
Abs Immature Granulocytes: 0.02 10*3/uL (ref 0.00–0.07)
Basophils Absolute: 0.1 10*3/uL (ref 0.0–0.1)
Basophils Relative: 1 %
Eosinophils Absolute: 0.2 10*3/uL (ref 0.0–0.5)
Eosinophils Relative: 5 %
HCT: 44 % (ref 39.0–52.0)
Hemoglobin: 15.6 g/dL (ref 13.0–17.0)
Immature Granulocytes: 1 %
Lymphocytes Relative: 45 %
Lymphs Abs: 1.9 10*3/uL (ref 0.7–4.0)
MCH: 36.1 pg — ABNORMAL HIGH (ref 26.0–34.0)
MCHC: 35.5 g/dL (ref 30.0–36.0)
MCV: 101.9 fL — ABNORMAL HIGH (ref 80.0–100.0)
Monocytes Absolute: 0.5 10*3/uL (ref 0.1–1.0)
Monocytes Relative: 13 %
Neutro Abs: 1.4 10*3/uL — ABNORMAL LOW (ref 1.7–7.7)
Neutrophils Relative %: 35 %
Platelet Count: 136 10*3/uL — ABNORMAL LOW (ref 150–400)
RBC: 4.32 MIL/uL (ref 4.22–5.81)
RDW: 13.1 % (ref 11.5–15.5)
WBC Count: 4.2 10*3/uL (ref 4.0–10.5)
nRBC: 0 % (ref 0.0–0.2)

## 2021-04-11 LAB — FERRITIN: Ferritin: 116 ng/mL (ref 24–336)

## 2021-04-11 LAB — IRON AND TIBC
Iron: 123 ug/dL (ref 42–163)
Saturation Ratios: 36 % (ref 20–55)
TIBC: 345 ug/dL (ref 202–409)
UIBC: 222 ug/dL (ref 117–376)

## 2021-04-11 LAB — LACTATE DEHYDROGENASE: LDH: 212 U/L — ABNORMAL HIGH (ref 98–192)

## 2021-04-11 LAB — RETICULOCYTES
Immature Retic Fract: 12.8 % (ref 2.3–15.9)
RBC.: 4.3 MIL/uL (ref 4.22–5.81)
Retic Count, Absolute: 76.1 10*3/uL (ref 19.0–186.0)
Retic Ct Pct: 1.8 % (ref 0.4–3.1)

## 2021-04-11 MED ORDER — SODIUM CHLORIDE 0.9 % IV SOLN: INTRAVENOUS | Status: DC

## 2021-04-11 MED ORDER — ZOLEDRONIC ACID 4 MG/100ML IV SOLN
4.0000 mg | Freq: Once | INTRAVENOUS | Status: AC
  Administered 2021-04-11: 4 mg via INTRAVENOUS
  Filled 2021-04-11: qty 100

## 2021-04-11 NOTE — Progress Notes (Signed)
Hematology and Oncology Follow Up Visit  Jorge [name] [id number] [birth year] 65 y.o. 04/11/2021   Principle Diagnosis:  IgG Kappa myeloma - Hyperdiploid/+11 DVT of the LEFT and RIGHT leg  Iron deficiency anemia  Current Therapy:   Revlimid 90m po q day (21 on/7 off) - start on 04/07/2018   Zometa 4 mg IV q 3 months - next dose is 06/2021 Xarelto 10 mg PO daily IV Iron as needed   Interim History:  Jorge [name] here today for follow-up.  He looks great.  He feels good.  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  I am incredibly impressed by his talus.  He is incredibly creative.  He feels well.  He has had no problems with nausea or vomiting.  He does have chronic back issues but this does not appear to be all that bad.  There is been no change in bowel or bladder habits.  He does not have a monoclonal spike with his last myeloma studies.  His IgG level was 1061 mg/dL.  The Kappa light chain was 3.2 mg/dL.  He continues on Revlimid.  He is doing well on Revlimid.  He is also on Xarelto chronically.  He has had no problems with the low-dose Xarelto.  Currently, I would say his performance status is probably ECOG 0.    Medications:  Allergies as of 04/11/2021       Reactions   Singulair [montelukast] Other (See Comments)   Mood change        Medication List        Accurate as of April 11, 2021 10:10 AM. If you have any questions, ask your nurse or doctor.          STOP taking these medications    montelukast 10 MG tablet Commonly known as: Singulair Stopped by: Jorge Napoleon MD       TAKE these medications    acetaminophen 325 MG tablet Commonly known as: TYLENOL Take 2 tablets (650 mg total) by mouth every 6 (six) hours as needed for mild pain (or Fever >/= 101). What changed: how much to take   albuterol 108 (90 Base) MCG/ACT  inhaler Commonly known as: VENTOLIN HFA TAKE 2 PUFFS BY MOUTH EVERY 6 HOURS AS NEEDED FOR WHEEZE OR SHORTNESS OF BREATH   amLODipine 5 MG tablet Commonly known as: NORVASC Take by mouth.   benzonatate 100 MG capsule Commonly known as: TESSALON Take 1 capsule (100 mg total) by mouth 3 (three) times daily as needed for cough.   cetirizine 10 MG tablet Commonly known as: ZYRTEC Take 10 mg by mouth daily as needed for allergies.   fluorouracil 5 % cream Commonly known as: EFUDEX APPLY TO AFFECTED AREA TWICE A DAY   fluticasone 50 MCG/ACT nasal spray Commonly known as: FLONASE SPRAY 2 SPRAYS INTO EACH NOSTRIL EVERY DAY   guaiFENesin-codeine 100-10 MG/5ML syrup Take 10 mLs by mouth every 6 (six) hours as needed for cough.   IMODIUM PO Take by mouth as needed.   ipratropium 0.03 % nasal spray Commonly known as: ATROVENT Place 1-2 sprays into both nostrils 2 (two) times daily as needed (nasal drainage).   lenalidomide 5 MG capsule Commonly known as: Revlimid TAKE 1 CAPSULE BY MOUTH  DAILY FOR 21 DAYS ON, THEN  7 DAYS OFF Authorization ##[id number]  lidocaine 5 %  Commonly known as: LIDODERM Place 1 patch onto the skin daily as needed (pain).   lisinopril 40 MG tablet Commonly known as: ZESTRIL Take 40 mg by mouth daily.   OVER THE COUNTER MEDICATION Take 1 capsule by mouth daily. methylcare otc supplement   Symbicort 160-4.5 MCG/ACT inhaler Generic drug: budesonide-formoterol TAKE 2 PUFFS BY MOUTH TWICE A DAY What changed: See the new instructions.   valsartan 160 MG tablet Commonly known as: Diovan Take 1 tablet (160 mg total) by mouth daily.   Vitamin D3 250 MCG (10000 UT) capsule Take 10,000 Units by mouth daily.   Xarelto 10 MG Tabs tablet Generic drug: rivaroxaban Take 1 tablet (10 mg total) by mouth daily.   zolpidem 5 MG tablet Commonly known as: AMBIEN Take 1 tablet (5 mg total) by mouth at bedtime.        Allergies:  Allergies  Allergen  Reactions   Singulair [Montelukast] Other (See Comments)    Mood change    Past Medical History, Surgical history, Social history, and Family History were reviewed and updated.  Review of Systems: Review of Systems  Constitutional: Negative.   HENT: Negative.   Eyes: Negative.   Respiratory: Negative.   Cardiovascular: Negative.   Gastrointestinal: Negative.   Genitourinary: Negative.   Musculoskeletal: Positive for myalgias.  Skin: Negative.   Neurological: Negative.   Endo/Heme/Allergies: Negative.   Psychiatric/Behavioral: Negative.      Physical Exam:  weight is 190 lb (86.2 kg). His oral temperature is 98.2 F (36.8 C). His blood pressure is 132/79 and his pulse is 56 (abnormal). His respiration is 18 and oxygen saturation is 96%.   Wt Readings from Last 3 Encounters:  04/11/21 190 lb (86.2 kg)  03/26/21 185 lb 6.4 oz (84.1 kg)  03/12/21 185 lb (83.9 kg)    Physical Exam Vitals signs reviewed.  HENT:     Head: Normocephalic and atraumatic.  Eyes:     Pupils: Pupils are equal, round, and reactive to light.  Neck:     Musculoskeletal: Normal range of motion.  Cardiovascular:     Rate and Rhythm: Normal rate and regular rhythm.     Heart sounds: Normal heart sounds.  Pulmonary:     Effort: Pulmonary effort is normal.     Breath sounds: Normal breath sounds.  Abdominal:     General: Bowel sounds are normal.     Palpations: Abdomen is soft.  Musculoskeletal: Normal range of motion.        General: No tenderness or deformity.  Lymphadenopathy:     Cervical: No cervical adenopathy.  Skin:    General: Skin is warm and dry.     Findings: No erythema or rash.  Neurological:     Mental Status: He is alert and oriented to person, place, and time.  Psychiatric:        Behavior: Behavior normal.        Thought Content: Thought content normal.        Judgment: Judgment normal.      Lab Results  Component Value Date   WBC 4.2 04/11/2021   HGB 15.6  04/11/2021   HCT 44.0 04/11/2021   MCV 101.9 (H) 04/11/2021   PLT 136 (L) 04/11/2021   Lab Results  Component Value Date   FERRITIN 147 01/10/2021   IRON 106 01/10/2021   TIBC 339 01/10/2021   UIBC 233 01/10/2021   IRONPCTSAT 31 01/10/2021   Lab Results  Component Value Date   RETICCTPCT 1.8 04/11/2021  RBC 4.32 04/11/2021   RBC 4.30 04/11/2021   Lab Results  Component Value Date   KPAFRELGTCHN 31.6 (H) 01/10/2021   LAMBDASER 26.2 01/10/2021   KAPLAMBRATIO 1.21 01/10/2021   Lab Results  Component Value Date   IGGSERUM 1,061 01/10/2021   IGA 382 01/10/2021   IGMSERUM 28 01/10/2021   Lab Results  Component Value Date   TOTALPROTELP 6.8 01/10/2021   ALBUMINELP 4.1 01/10/2021   A1GS 0.2 01/10/2021   A2GS 0.5 01/10/2021   BETS 1.0 01/10/2021   GAMS 1.1 01/10/2021   MSPIKE Not Observed 01/10/2021   SPEI Comment 01/05/2018     Chemistry      Component Value Date/Time   NA 139 04/11/2021 0909   NA 143 06/30/2017 1049   NA 140 10/03/2016 0845   K 4.1 04/11/2021 0909   K 4.0 06/30/2017 1049   K 4.3 10/03/2016 0845   CL 104 04/11/2021 0909   CL 105 06/30/2017 1049   CO2 26 04/11/2021 0909   CO2 27 06/30/2017 1049   CO2 26 10/03/2016 0845   BUN 11 04/11/2021 0909   BUN 13 06/30/2017 1049   BUN 11.9 10/03/2016 0845   CREATININE 0.95 04/11/2021 0909   CREATININE 1.0 06/30/2017 1049   CREATININE 0.9 10/03/2016 0845      Component Value Date/Time   CALCIUM 9.4 04/11/2021 0909   CALCIUM 9.0 06/30/2017 1049   CALCIUM 9.9 10/03/2016 0845   ALKPHOS 51 04/11/2021 0909   ALKPHOS 57 06/30/2017 1049   ALKPHOS 80 10/03/2016 0845   AST 48 (H) 04/11/2021 0909   AST 28 10/03/2016 0845   ALT 48 (H) 04/11/2021 0909   ALT 43 06/30/2017 1049   ALT 26 10/03/2016 0845   BILITOT 0.9 04/11/2021 0909   BILITOT 0.48 10/03/2016 0845      Impression and Plan: Jorge [name] is a pleasant 65 yo gentleman with IgG kappa myeloma.  He ultimately underwent an autologous stem cell  transplant at [facility] in February 2018.   It is nice to see that everything is going well for him.  Again, I am so impressed that he has this novel.  I am just very impressed by his creativity.  I see no reason to make any changes with his protocol.  We will watch his light chains.  It seems like they have been trending upward slowly.  He gets his Zometa today.  We we will plan on getting him back in 3 months.     [name], MD 9/29/202210:10 AM

## 2021-04-11 NOTE — Patient Instructions (Signed)

## 2021-04-12 LAB — KAPPA/LAMBDA LIGHT CHAINS
Kappa free light chain: 37.3 mg/L — ABNORMAL HIGH (ref 3.3–19.4)
Kappa, lambda light chain ratio: 1.67 — ABNORMAL HIGH (ref 0.26–1.65)
Lambda free light chains: 22.3 mg/L (ref 5.7–26.3)

## 2021-04-12 LAB — IGG, IGA, IGM
IgA: 404 mg/dL (ref 61–437)
IgG (Immunoglobin G), Serum: 1184 mg/dL (ref 603–1613)
IgM (Immunoglobulin M), Srm: 33 mg/dL (ref 20–172)

## 2021-04-15 LAB — PROTEIN ELECTROPHORESIS, SERUM, WITH REFLEX
A/G Ratio: 1.4 (ref 0.7–1.7)
Albumin ELP: 3.8 g/dL (ref 2.9–4.4)
Alpha-1-Globulin: 0.2 g/dL (ref 0.0–0.4)
Alpha-2-Globulin: 0.5 g/dL (ref 0.4–1.0)
Beta Globulin: 0.9 g/dL (ref 0.7–1.3)
Gamma Globulin: 1.2 g/dL (ref 0.4–1.8)
Globulin, Total: 2.7 g/dL (ref 2.2–3.9)
Total Protein ELP: 6.5 g/dL (ref 6.0–8.5)

## 2021-05-09 ENCOUNTER — Other Ambulatory Visit: Payer: Self-pay | Admitting: Hematology & Oncology

## 2021-05-09 DIAGNOSIS — C9000 Multiple myeloma not having achieved remission: Secondary | ICD-10-CM

## 2021-05-10 ENCOUNTER — Other Ambulatory Visit: Payer: Self-pay | Admitting: *Deleted

## 2021-05-10 DIAGNOSIS — C9000 Multiple myeloma not having achieved remission: Secondary | ICD-10-CM

## 2021-05-10 MED ORDER — LENALIDOMIDE 5 MG PO CAPS: ORAL_CAPSULE | ORAL | 0 refills | Status: DC

## 2021-05-20 ENCOUNTER — Encounter: Payer: Self-pay | Admitting: Hematology & Oncology

## 2021-05-24 ENCOUNTER — Other Ambulatory Visit: Payer: Self-pay | Admitting: Emergency Medicine

## 2021-05-28 ENCOUNTER — Telehealth: Payer: Self-pay

## 2021-05-28 ENCOUNTER — Other Ambulatory Visit: Payer: Self-pay | Admitting: Family

## 2021-05-28 ENCOUNTER — Other Ambulatory Visit: Payer: Self-pay

## 2021-05-28 ENCOUNTER — Ambulatory Visit (INDEPENDENT_AMBULATORY_CARE_PROVIDER_SITE_OTHER): Payer: Medicare Other | Admitting: Allergy

## 2021-05-28 ENCOUNTER — Ambulatory Visit (INDEPENDENT_AMBULATORY_CARE_PROVIDER_SITE_OTHER): Payer: Medicare Other | Admitting: Sports Medicine

## 2021-05-28 ENCOUNTER — Encounter: Payer: Self-pay | Admitting: Allergy

## 2021-05-28 VITALS — BP 126/88 | HR 65 | Temp 97.0°F | Resp 18 | Ht 67.0 in | Wt 190.1 lb

## 2021-05-28 VITALS — BP 124/90 | Ht 67.0 in | Wt 185.0 lb

## 2021-05-28 DIAGNOSIS — J3089 Other allergic rhinitis: Secondary | ICD-10-CM | POA: Diagnosis not present

## 2021-05-28 DIAGNOSIS — J189 Pneumonia, unspecified organism: Secondary | ICD-10-CM | POA: Diagnosis not present

## 2021-05-28 DIAGNOSIS — Q667 Congenital pes cavus, unspecified foot: Secondary | ICD-10-CM

## 2021-05-28 DIAGNOSIS — R053 Chronic cough: Secondary | ICD-10-CM | POA: Diagnosis not present

## 2021-05-28 MED ORDER — IPRATROPIUM BROMIDE 0.03 % NA SOLN: 1.0000 | Freq: Two times a day (BID) | NASAL | 5 refills | Status: DC | PRN

## 2021-05-28 NOTE — Patient Instructions (Addendum)
Rhinitis: 2022 skin testing showed: Positive to perennial mold #4 and dust mites.  Use Atrovent (ipratropium) 0.03% 1-2 sprays per nostril twice a day as needed for runny nose/drainage. Try to use before going to bed.  Use Flonase (fluticasone) nasal spray 1 spray per nostril twice a day as needed for nasal congestion.  Nasal saline spray (i.e., Simply Saline) or nasal saline lavage (i.e., NeilMed) is recommended as needed and prior to medicated nasal sprays. Continue environmental control measures. Use over the counter antihistamines such as Zyrtec (cetirizine), Claritin (loratadine), Allegra (fexofenadine), or Xyzal (levocetirizine) daily as needed. May take twice a day during allergy flares. May switch antihistamines every few months. Refer to ENT - Dr. Benjamine Mola or [facility] for rhinitis and chronic coughing.  Breathing: Follows with pulmonology. Daily controller medication(s): none. During upper respiratory infections/asthma flares: start Symbicort 174mcg 2 puffs twice a day and rinse mouth after each use for 1-2 weeks until your breathing symptoms return to baseline.  May use albuterol rescue inhaler 2 puffs every 4 to 6 hours as needed for shortness of breath, chest tightness, coughing, and wheezing. May use albuterol rescue inhaler 2 puffs 5 to 15 minutes prior to strenuous physical activities. Monitor frequency of use.  Breathing control goals:  Full participation in all desired activities (may need albuterol before activity) Albuterol use two times or less a week on average (not counting use with activity) Cough interfering with sleep two times or less a month Oral steroids no more than once a year No hospitalizations   Infections: Keep track of infections and antibiotics use.  Follow up in 6 months or sooner if needed.    Control of House Dust Mite Allergen Dust mite allergens are a common trigger of allergy and asthma symptoms. While they can be found throughout the house, these  microscopic creatures thrive in warm, humid environments such as bedding, upholstered furniture and carpeting. Because so much time is spent in the bedroom, it is essential to reduce mite levels there.  Encase pillows, mattresses, and box springs in special allergen-proof fabric covers or airtight, zippered plastic covers.  Bedding should be washed weekly in hot water (130 F) and dried in a hot dryer. Allergen-proof covers are available for comforters and pillows that can't be regularly washed.  Wash the allergy-proof covers every few months. Minimize clutter in the bedroom. Keep pets out of the bedroom.  Keep humidity less than 50% by using a dehumidifier or air conditioning. You can buy a humidity measuring device called a hygrometer to monitor this.  If possible, replace carpets with hardwood, linoleum, or washable area rugs. If that's not possible, vacuum frequently with a vacuum that has a HEPA filter. Remove all upholstered furniture and non-washable window drapes from the bedroom. Remove all non-washable stuffed toys from the bedroom.  Wash stuffed toys weekly.  Mold Control Mold and fungi can grow on a variety of surfaces provided certain temperature and moisture conditions exist.  Outdoor molds grow on plants, decaying vegetation and soil. The major outdoor mold, Alternaria and Cladosporium, are found in very high numbers during hot and dry conditions. Generally, a late summer - fall peak is seen for common outdoor fungal spores. Rain will temporarily lower outdoor mold spore count, but counts rise rapidly when the rainy period ends. The most important indoor molds are Aspergillus and Penicillium. Dark, humid and poorly ventilated basements are ideal sites for mold growth. The next most common sites of mold growth are the bathroom and the kitchen.  Outdoor (Seasonal) Mold Control Use air conditioning and keep windows closed. Avoid exposure to decaying vegetation. Avoid leaf raking. Avoid  grain handling. Consider wearing a face mask if working in moldy areas.  Indoor (Perennial) Mold Control  Maintain humidity below 50%. Get rid of mold growth on hard surfaces with water, detergent and, if necessary, 5% bleach (do not mix with other cleaners). Then dry the area completely. If mold covers an area more than 10 square feet, consider hiring an indoor environmental professional. For clothing, washing with soap and water is best. If moldy items cannot be cleaned and dried, throw them away. Remove sources e.g. contaminated carpets. Repair and seal leaking roofs or pipes. Using dehumidifiers in damp basements may be helpful, but empty the water and clean units regularly to prevent mildew from forming. All rooms, especially basements, bathrooms and kitchens, require ventilation and cleaning to deter mold and mildew growth. Avoid carpeting on concrete or damp floors, and storing items in damp areas.   Buffered Isotonic Saline Irrigations:  Goal: When you irrigate with the isotonic saline (salt water) it washes mucous and other debris from your nose that could be contributing to your nasal symptoms.   Recipe: Obtain 1 quart jar that is clean Fill with clean (bottled, boiled or distilled) water Add 1-2 heaping teaspoons of salt without iodine If the solution with 2 teaspoons of salt is too strong, adjust the amount down until better tolerated Add 1 teaspoon of [employer] baking soda (pure bicarbonate) Mix ingredients together and store at room temperature and discard after 1 week * Alternatively you can buy pre made salt packets for the NeilMed bottle or there          are other over the counter brands available  Instructions: Warm  cup of the solution in the microwave if desired but be careful not to overheat as this will burn the inside of your nose Stand over a sink (or do it while you shower) and squirt the solution into one side of your nose aiming towards the back of your  head Sometimes saying "coca cola" while irrigating can be helpful to prevent fluid from going down your throat  The solution will travel to the back of your nose and then come out the other side Perform this again on the other side Try to do this twice a day If you are using a nasal spray in addition to the irrigation, irrigate first and then use the topical nasal spray otherwise you will wash the nasal spray out of your nose

## 2021-05-28 NOTE — Assessment & Plan Note (Signed)
Past history - Coughing, wheezing, shortness of breath and chest tightness for 5 years.  Tried albuterol and Symbicort as needed with some benefit.  COVID-19 infection 2 months ago.  Being treated for multiple myeloma for the last 5 years.  Follows with pulmonology.  Tried PPI with no benefit. 2022 PFT showed restriction, 2022 CT chest showed atelectasis, hiatal hernia, lytic rib lesions. Interim history - coughing 50% improved. No inhaler use.  Jorge [name] with pulmonology. . Daily controller medication(s): none. . During upper respiratory infections/asthma flares: start Symbicort 1104mg 2 puffs twice a day and rinse mouth after each use for 1-2 weeks until your breathing symptoms return to baseline.  . May use albuterol rescue inhaler 2 puffs every 4 to 6 hours as needed for shortness of breath, chest tightness, coughing, and wheezing. May use albuterol rescue inhaler 2 puffs 5 to 15 minutes prior to strenuous physical activities. Monitor frequency of use.

## 2021-05-28 NOTE — Progress Notes (Signed)
Follow Up Note  RE: Jorge [name] MRN: [id number] DOB: [birth year] Date of Office Visit: 05/28/2021  Referring provider: Elise Benne Primary care provider: Mackie Pai, PA-C  Chief Complaint: Allergic Rhinitis  (Better than last visit )  History of Present Illness: I had the pleasure of seeing Jorge [name] for a follow up visit at the [facility] on 05/28/2021. He is a 65 y.o. male, who is being followed for allergic rhinitis, cough, frequent pneumonias. His previous allergy office visit was on 03/26/2021 with Dr. Maudie Mercury. Today is a regular follow up visit.  Allergic rhinitis Currently taking zyrtec 110m in the evening, Flonase 2 sprays BID and using Atrovent in the middle of the day with good benefit. No nosebleeds. Singulair caused mood changes.    Chronic cough Coughing is about 50% improved. No inhaler use since the last visit. Patient has been walking and running lately.  1 episode of wheezing which resolved after using the nasal spray in the morning.    Frequent episodes of pneumonia No infections or antibiotics since the last visit.  Up to date with flu vaccine.   Getting treatment for multiple myeloma and doing well.  Assessment and Plan: Jorge [name] a 65y.o. male with: Perennial allergic rhinitis Past history - Perennial rhinitis symptoms for the last 5 years with worsening coughing in the mornings.  Tried Flonase and Astelin with good benefit.  No benefit with antihistamines.  No prior ENT evaluation. 2022 skin testing showed: Positive to perennial mold #4 and dust mites.  Interim history - Singulair caused mood changes. PND mainly in the mornings.  Use Atrovent (ipratropium) 0.03% 1-2 sprays per nostril twice a day as needed for runny nose/drainage. Try to use before going to bed.  Use Flonase (fluticasone) nasal spray 1 spray per nostril twice a day as needed for nasal congestion.  Nasal saline spray (i.e., Simply Saline) or nasal  saline lavage (i.e., NeilMed) is recommended as needed and prior to medicated nasal sprays. Continue environmental control measures. Use over the counter antihistamines such as Zyrtec (cetirizine), Claritin (loratadine), Allegra (fexofenadine), or Xyzal (levocetirizine) daily as needed. May take twice a day during allergy flares. May switch antihistamines every few months. Refer to ENT - for rhinitis and chronic coughing.  Chronic cough Past history - Coughing, wheezing, shortness of breath and chest tightness for 5 years.  Tried albuterol and Symbicort as needed with some benefit.  COVID-19 infection 2 months ago.  Being treated for multiple myeloma for the last 5 years.  Follows with pulmonology.  Tried PPI with no benefit. 2022 PFT showed restriction, 2022 CT chest showed atelectasis, hiatal hernia, lytic rib lesions. Interim history - coughing 50% improved. No inhaler use.  Follows with pulmonology. Daily controller medication(s): none. During upper respiratory infections/asthma flares: start Symbicort 1666m 2 puffs twice a day and rinse mouth after each use for 1-2 weeks until your breathing symptoms return to baseline.  May use albuterol rescue inhaler 2 puffs every 4 to 6 hours as needed for shortness of breath, chest tightness, coughing, and wheezing. May use albuterol rescue inhaler 2 puffs 5 to 15 minutes prior to strenuous physical activities. Monitor frequency of use.   Frequent episodes of pneumonia Past history - Frequent bouts of pneumonia requiring hospitalization and ? intubation in the past. Medical history significant for multiple myeloma.  Interim history - no infections since last visit. Up to date with flu vaccine.  Keep track of infections and antibiotics use. Consider  getting basic immune evaluation bloodwork if getting frequent infections in future.   Return in about 6 months (around 11/25/2021).  Meds ordered this encounter  Medications   ipratropium (ATROVENT) 0.03 %  nasal spray    Sig: Place 1-2 sprays into both nostrils 2 (two) times daily as needed (nasal drainage).    Dispense:  30 mL    Refill:  5    Lab Orders  No laboratory test(s) ordered today    Diagnostics: None.   Medication List:  Current Outpatient Medications  Medication Sig Dispense Refill   acetaminophen (TYLENOL) 325 MG tablet Take 2 tablets (650 mg total) by mouth every 6 (six) hours as needed for mild pain (or Fever >/= 101). (Patient taking differently: Take 325 mg by mouth every 6 (six) hours as needed for mild pain (or Fever >/= 101).)     albuterol (VENTOLIN HFA) 108 (90 Base) MCG/ACT inhaler TAKE 2 PUFFS BY MOUTH EVERY 6 HOURS AS NEEDED FOR WHEEZE OR SHORTNESS OF BREATH 6.7 each 1   amLODipine (NORVASC) 5 MG tablet Take by mouth.     benzonatate (TESSALON) 100 MG capsule Take 1 capsule (100 mg total) by mouth 3 (three) times daily as needed for cough. 30 capsule 0   cetirizine (ZYRTEC) 10 MG tablet Take 10 mg by mouth daily as needed for allergies.     Cholecalciferol (VITAMIN D3) 250 MCG (10000 UT) capsule Take 10,000 Units by mouth daily.     fluorouracil (EFUDEX) 5 % cream APPLY TO AFFECTED AREA TWICE A DAY     fluticasone (FLONASE) 50 MCG/ACT nasal spray SPRAY 2 SPRAYS INTO EACH NOSTRIL EVERY DAY 16 mL 1   guaiFENesin-codeine 100-10 MG/5ML syrup Take 10 mLs by mouth every 6 (six) hours as needed for cough. 240 mL 0   ipratropium (ATROVENT) 0.03 % nasal spray Place 1-2 sprays into both nostrils 2 (two) times daily as needed (nasal drainage). 30 mL 5   lenalidomide (REVLIMID) 5 MG capsule TAKE 1 CAPSULE BY MOUTH  DAILY FOR 21 DAYS, THEN 7  DAYS OFF.  Auth number [id number] 21 capsule 0   lidocaine (LIDODERM) 5 % Place 1 patch onto the skin daily as needed (pain).   2   Loperamide HCl (IMODIUM PO) Take by mouth as needed.     OVER THE COUNTER MEDICATION Take 1 capsule by mouth daily. methylcare otc supplement     SYMBICORT 160-4.5 MCG/ACT inhaler TAKE 2 PUFFS BY MOUTH TWICE A  DAY (Patient taking differently: Inhale 2 puffs into the lungs daily as needed.) 10.2 each 0   valsartan (DIOVAN) 160 MG tablet TAKE 1 TABLET BY MOUTH EVERY DAY 30 tablet 3   XARELTO 10 MG TABS tablet Take 1 tablet (10 mg total) by mouth daily. 90 tablet 3   zolpidem (AMBIEN) 5 MG tablet Take 1 tablet (5 mg total) by mouth at bedtime. 30 tablet 5   Current Facility-Administered Medications  Medication Dose Route Frequency Provider Last Rate Last Admin   0.9 %  sodium chloride infusion   Intravenous PRN Saguier, Percell Miller, PA-C       Facility-Administered Medications Ordered in Other Visits  Medication Dose Route Frequency Provider Last Rate Last Admin   heparin lock flush 100 unit/mL  500 Units Intravenous Once Celso Amy, NP       sodium chloride flush (NS) 0.9 % injection 3 mL  3 mL Intravenous Once PRN Celso Amy, NP       Allergies: Allergies  Allergen  Reactions   Singulair [Montelukast] Other (See Comments)    Mood change   I reviewed his past medical history, social history, family history, and environmental history and no significant changes have been reported from his previous visit.  Review of Systems  Constitutional:  Negative for appetite change, chills, fever and unexpected weight change.  HENT:  Positive for congestion, postnasal drip and rhinorrhea.   Eyes:  Negative for itching.  Respiratory:  Positive for cough. Negative for chest tightness, shortness of breath and wheezing.   Cardiovascular:  Negative for chest pain.  Gastrointestinal:  Negative for abdominal pain.  Genitourinary:  Negative for difficulty urinating.  Skin:  Negative for rash.  Allergic/Immunologic: Positive for environmental allergies. Negative for food allergies.  Neurological:  Negative for headaches.   Objective: BP 126/88   Pulse 65   Temp (!) 97 F (36.1 C)   Resp 18   Ht '5\' 7"'  (1.702 m)   Wt 190 lb 1.9 oz (86.2 kg)   SpO2 95%   BMI 29.78 kg/m  Body mass index is 29.78  kg/m. Physical Exam Vitals and nursing note reviewed.  Constitutional:      Appearance: Normal appearance. He is well-developed.  HENT:     Head: Normocephalic and atraumatic.     Right Ear: Tympanic membrane and external ear normal.     Left Ear: Tympanic membrane and external ear normal.     Nose: Nose normal.     Mouth/Throat:     Mouth: Mucous membranes are moist.     Pharynx: Oropharynx is clear.  Eyes:     Conjunctiva/sclera: Conjunctivae normal.  Cardiovascular:     Rate and Rhythm: Normal rate and regular rhythm.     Heart sounds: Normal heart sounds. No murmur heard.   No friction rub. No gallop.  Pulmonary:     Effort: Pulmonary effort is normal.     Breath sounds: Normal breath sounds. No wheezing, rhonchi or rales.  Musculoskeletal:     Cervical back: Neck supple.  Skin:    General: Skin is warm.     Findings: No rash.  Neurological:     Mental Status: He is alert and oriented to person, place, and time.  Psychiatric:        Behavior: Behavior normal.   Previous notes and tests were reviewed. The plan was reviewed with the patient/family, and all questions/concerned were addressed.  It was my pleasure to see Jorge [name] today and participate in his care. Please feel free to contact me with any questions or concerns.  Sincerely,  Rexene Alberts, DO Allergy & Immunology  [facility] office: [place] office: [phone]

## 2021-05-28 NOTE — Assessment & Plan Note (Signed)
Past history - Frequent bouts of pneumonia requiring hospitalization and ? intubation in the past. Medical history significant for multiple myeloma.  Interim history - no infections since last visit. Up to date with flu vaccine.  [name] Keep track of infections and antibiotics use. . Consider getting basic immune evaluation bloodwork if getting frequent infections in future.

## 2021-05-28 NOTE — Assessment & Plan Note (Signed)
Past history - Perennial rhinitis symptoms for the last 5 years with worsening coughing in the mornings.  Tried Flonase and Astelin with good benefit.  No benefit with antihistamines.  No prior ENT evaluation. 2022 skin testing showed: Positive to perennial mold #4 and dust mites.  Interim history - Singulair caused mood changes. PND mainly in the mornings.   Use Atrovent (ipratropium) 0.03% 1-2 sprays per nostril twice a day as needed for runny nose/drainage.  Try to use before going to bed.  . Use Flonase (fluticasone) nasal spray 1 spray per nostril twice a day as needed for nasal congestion.   Nasal saline spray (i.e., Simply Saline) or nasal saline lavage (i.e., NeilMed) is recommended as needed and prior to medicated nasal sprays.  Continue environmental control measures.  Use over the counter antihistamines such as Zyrtec (cetirizine), Claritin (loratadine), Allegra (fexofenadine), or Xyzal (levocetirizine) daily as needed. May take twice a day during allergy flares. May switch antihistamines every few months.  Refer to ENT - for rhinitis and chronic coughing.

## 2021-05-28 NOTE — Assessment & Plan Note (Signed)
>>  ASSESSMENT AND PLAN FOR FREQUENT EPISODES OF PNEUMONIA WRITTEN ON 05/28/2021 12:39 PM BY Garnet Sierras, DO  Past history - Frequent bouts of pneumonia requiring hospitalization and ? intubation in the past. Medical history significant for multiple myeloma.  Interim history - no infections since last visit. Up to date with flu vaccine.  [name] Keep track of infections and antibiotics use. . Consider getting basic immune evaluation bloodwork if getting frequent infections in future.

## 2021-05-28 NOTE — Progress Notes (Signed)
Patient ID: Jorge [name], male   DOB: Dec 25, 1955, 65 y.o.   MRN: [id number]  Jorge [name] presents today for new custom orthotics.  His current orthotics are quite worn.  They were constructed almost 3 years ago.  He has recently purchased new running shoes and feels like it is time for a new pair of orthotics as well.  Physical exam of his feet show a rigid cavus foot bilaterally.  He does have some numbness this distal to the metatarsal heads on the plantar surface of his left foot.  He denies numbness elsewhere in his feet.  Custom orthotics were made as below.  A metatarsal pad was placed on the left insert.  Hopefully this will help alleviate some of his numbness in the left foot.  He may continue with activity without restriction and will follow-up with [place] as needed.  Patient was fitted for a : standard, cushioned, semi-rigid orthotic. The orthotic was heated and afterward the patient stood on the orthotic blank positioned on the orthotic stand. The patient was positioned in subtalar neutral position and 10 degrees of ankle dorsiflexion in a weight bearing stance. After completion of molding, a stable base was applied to the orthotic blank. The blank was ground to a stable position for weight bearing. Size: 11 Base: Blue EVA Posting: none Additional orthotic padding: MT pad on the left

## 2021-05-28 NOTE — Telephone Encounter (Signed)
Please refer patient to ENT - Dr. Benjamine Mola or [facility] for rhinitis and chronic coughing. Per Dr. Maudie Mercury

## 2021-06-04 ENCOUNTER — Inpatient Hospital Stay: Payer: Medicare Other | Attending: Hematology & Oncology

## 2021-06-04 ENCOUNTER — Other Ambulatory Visit: Payer: Self-pay

## 2021-06-04 VITALS — BP 112/64 | HR 74 | Temp 97.8°F | Resp 18

## 2021-06-04 DIAGNOSIS — C9000 Multiple myeloma not having achieved remission: Secondary | ICD-10-CM | POA: Diagnosis not present

## 2021-06-04 DIAGNOSIS — Z298 Encounter for other specified prophylactic measures: Secondary | ICD-10-CM | POA: Diagnosis not present

## 2021-06-04 DIAGNOSIS — D849 Immunodeficiency, unspecified: Secondary | ICD-10-CM

## 2021-06-04 MED ORDER — CILGAVIMAB (PART OF EVUSHELD) INJECTION
300.0000 mg | Freq: Once | INTRAMUSCULAR | Status: AC
  Administered 2021-06-04: 300 mg via INTRAMUSCULAR
  Filled 2021-06-04: qty 3

## 2021-06-04 MED ORDER — TIXAGEVIMAB (PART OF EVUSHELD) INJECTION
300.0000 mg | Freq: Once | INTRAMUSCULAR | Status: AC
  Administered 2021-06-04: 300 mg via INTRAMUSCULAR
  Filled 2021-06-04: qty 3

## 2021-06-04 NOTE — Patient Instructions (Signed)
Tixagevimab; Cilgavimab Solutions for Injection °What is this medication? °TIXAGEVIMAB; CILGAVIMAB (tix a jev i mab; sil gav i mab) reduces the risk of getting COVID-19 in persons with immune system problems who may not respond properly to the COVID-19 vaccine or persons who can't receive the COVID-19 vaccine. It belongs to a group of medications called monoclonal antibodies. It may decrease the risk of developing severe symptoms of COVID-19. It may also decrease the chance of going to the hospital. This medication is not approved by the FDA. The FDA has authorized emergency use of this medication during the COVID-19 pandemic. °This medicine may be used for other purposes; ask your health care provider or pharmacist if you have questions. °COMMON BRAND NAME(S): EVUSHELD °What should I tell my care team before I take this medication? °They need to know if you have any of these conditions: °any allergies °any serious illness °bleeding disorder °have received a COVID-19 vaccine °heart disease °an unusual or allergic reaction to tixagevimab, cilgavimab, other medications, foods, dyes, or preservatives °pregnant or trying to get pregnant °breast-feeding °How should I use this medication? °This medication is injected into a muscle. It is given by your care team in a hospital or clinic setting. °Talk to your care team about the use of this medication in children. While it may be given to children as young as 12 years, precautions do apply. °Overdosage: If you think you have taken too much of this medicine contact a poison control center or emergency room at once. °NOTE: This medicine is only for you. Do not share this medicine with others. °What if I miss a dose? °Keep appointments for follow-up doses. It is important not to miss your dose. Call your care team if you are unable to keep an appointment. °What may interact with this medication? °COVID-19 vaccines °This list may not describe all possible interactions. Give  your health care provider a list of all the medicines, herbs, non-prescription drugs, or dietary supplements you use. Also tell them if you smoke, drink alcohol, or use illegal drugs. Some items may interact with your medicine. °What should I watch for while using this medication? °Your condition will be monitored carefully while you are receiving this medication. Visit your care team for regular checks on your progress. Tell your care team if your symptoms do not start to get better or if they get worse. °After receiving this medication, wait at least 14 days before getting the COVID-19 vaccine. °What side effects may I notice from receiving this medication? °Side effects that you should report to your care team as soon as possible: °Allergic reactions--skin rash, itching, hives, swelling of the face, lips, tongue, or throat °Heart attack--pain or tightness in the chest, shoulders, arms, or jaw, nausea, shortness of breath, cold or clammy skin, feeling faint or lightheaded °Heart failure--shortness of breath, swelling of the ankles, feet, or hands, sudden weight gain, unusual weakness or fatigue °Heart rhythm changes--fast or irregular heartbeat, dizziness, feeling faint or lightheaded, chest pain, trouble breathing °Side effects that usually do not require medical attention (report these to your care team if they continue or are bothersome): °Cough °Fatigue °Headache °Pain, redness, or irritation at site where injected °This list may not describe all possible side effects. Call your doctor for medical advice about side effects. You may report side effects to FDA at 1-800-FDA-1088. °Where should I keep my medication? °This medication is given in a hospital or clinic. It will not be stored at home. °NOTE: This sheet is   a summary. It may not cover all possible information. If you have questions about this medicine, talk to your doctor, pharmacist, or health care provider. °© 2022 [employer] (2020-06-21  00:00:00) ° °

## 2021-06-05 ENCOUNTER — Telehealth: Payer: Self-pay | Admitting: Medical

## 2021-06-05 NOTE — Telephone Encounter (Signed)
Left message for patient to call back and schedule Medicare Annual Wellness Visit (AWV) in office.   If not able to come in office, please offer to do virtually or by telephone.  Left office number and my jabber [phone].  AWVI eligible as of  03/14/2020  Please schedule at anytime with Nurse Health Advisor.

## 2021-06-10 ENCOUNTER — Other Ambulatory Visit: Payer: Self-pay | Admitting: Hematology & Oncology

## 2021-06-10 DIAGNOSIS — C9000 Multiple myeloma not having achieved remission: Secondary | ICD-10-CM

## 2021-06-11 ENCOUNTER — Other Ambulatory Visit: Payer: Self-pay

## 2021-06-11 DIAGNOSIS — C9000 Multiple myeloma not having achieved remission: Secondary | ICD-10-CM

## 2021-06-11 MED ORDER — LENALIDOMIDE 5 MG PO CAPS: ORAL_CAPSULE | ORAL | 0 refills | Status: DC

## 2021-06-28 ENCOUNTER — Other Ambulatory Visit: Payer: Self-pay | Admitting: *Deleted

## 2021-06-28 DIAGNOSIS — C9000 Multiple myeloma not having achieved remission: Secondary | ICD-10-CM

## 2021-06-28 MED ORDER — LENALIDOMIDE 5 MG PO CAPS: ORAL_CAPSULE | ORAL | 0 refills | Status: DC

## 2021-07-02 ENCOUNTER — Ambulatory Visit (INDEPENDENT_AMBULATORY_CARE_PROVIDER_SITE_OTHER): Payer: Medicare Other

## 2021-07-02 VITALS — Ht 67.0 in | Wt 185.0 lb

## 2021-07-02 DIAGNOSIS — Z0000 Encounter for general adult medical examination without abnormal findings: Secondary | ICD-10-CM

## 2021-07-02 NOTE — Progress Notes (Addendum)
Subjective:   Jorge [name] is a 65 y.o. male who presents for an Initial Medicare Annual Wellness Visit.  I connected with  [name] today by a video enabled telemedicine application and verified that I am speaking with the correct person using two identifiers.  Location of patient:Home Location of provider:Work  Persons participating in the virtual visit: patient, nurse.   I discussed the limitations, risk, security and privacy concerns of evaluation and management by telemedicine. The patient expressed understanding and agreed to proceed.  Some vital signs may be absent or patient reported.   Review of Systems     Cardiac Risk Factors include: advanced age (>87mn, >>4women);male gender     Objective:    Today's Vitals   07/02/21 1414 07/02/21 1415  Weight: 185 lb (83.9 kg)   Height: '5\' 7"'  (1.702 m)   PainSc:  3    Body mass index is 28.98 kg/m.  Advanced Directives 07/02/2021 04/11/2021 03/12/2021 01/10/2021 10/11/2020 09/13/2020 07/05/2020  Does Patient Have a Medical Advance Directive? Yes Yes No Yes Yes Yes No  Type of AParamedicof AHamm[place] will Living will - Living will Living will Living will;Healthcare Power of Attorney Living will;Healthcare Power of Attorney  Does patient want to make changes to medical advance directive? - No - Patient declined - No - Patient declined No - Patient declined - No - Patient declined  Copy of HFive Cornersin Chart? Yes - validated most recent copy scanned in chart (See row information) - - - - - -  Would patient like information on creating a medical advance directive? - - No - Patient declined - - - -    Current Medications (verified) Outpatient Encounter Medications as of 07/02/2021  Medication Sig   acetaminophen (TYLENOL) 325 MG tablet Take 2 tablets (650 mg total) by mouth every 6 (six) hours as needed for mild pain (or Fever >/= 101). (Patient taking differently: Take 325 mg by  mouth every 6 (six) hours as needed for mild pain (or Fever >/= 101).)   albuterol (VENTOLIN HFA) 108 (90 Base) MCG/ACT inhaler TAKE 2 PUFFS BY MOUTH EVERY 6 HOURS AS NEEDED FOR WHEEZE OR SHORTNESS OF BREATH   amLODipine (NORVASC) 5 MG tablet Take by mouth.   benzonatate (TESSALON) 100 MG capsule Take 1 capsule (100 mg total) by mouth 3 (three) times daily as needed for cough.   cetirizine (ZYRTEC) 10 MG tablet Take 10 mg by mouth daily as needed for allergies.   Cholecalciferol (VITAMIN D3) 250 MCG (10000 UT) capsule Take 10,000 Units by mouth daily.   fluorouracil (EFUDEX) 5 % cream APPLY TO AFFECTED AREA TWICE A DAY   fluticasone (FLONASE) 50 MCG/ACT nasal spray SPRAY 2 SPRAYS INTO EACH NOSTRIL EVERY DAY   guaiFENesin-codeine 100-10 MG/5ML syrup Take 10 mLs by mouth every 6 (six) hours as needed for cough.   ipratropium (ATROVENT) 0.03 % nasal spray Place 1-2 sprays into both nostrils 2 (two) times daily as needed (nasal drainage).   lenalidomide (REVLIMID) 5 MG capsule TAKE 1 CAPSULE BY MOUTH DAILY  FOR 21 DAYS, THEN 7 DAYS OFF. Auth ##[id number]  lidocaine (LIDODERM) 5 % Place 1 patch onto the skin daily as needed (pain).    Loperamide HCl (IMODIUM PO) Take by mouth as needed.   OVER THE COUNTER MEDICATION Take 1 capsule by mouth daily. methylcare otc supplement   SYMBICORT 160-4.5 MCG/ACT inhaler TAKE 2 PUFFS BY MOUTH TWICE A DAY (Patient taking differently:  Inhale 2 puffs into the lungs daily as needed.)   valsartan (DIOVAN) 160 MG tablet TAKE 1 TABLET BY MOUTH EVERY DAY   XARELTO 10 MG TABS tablet Take 1 tablet (10 mg total) by mouth daily.   zolpidem (AMBIEN) 5 MG tablet Take 1 tablet (5 mg total) by mouth at bedtime.   Facility-Administered Encounter Medications as of 07/02/2021  Medication   0.9 %  sodium chloride infusion   heparin lock flush 100 unit/mL   sodium chloride flush (NS) 0.9 % injection 3 mL    Allergies (verified) Singulair [montelukast]   History: Past Medical  History:  Diagnosis Date   Allergy    Anxiety    Asthma    Blood transfusion without reported diagnosis 2018   Bone metastasis ([place])    Chest cold 05/19/2016   productive cough  -- started on antibiotic   Chronic back pain    due to bone mets from myeloma   Cough    Depression    Diverticulitis    GERD (gastroesophageal reflux disease)    Hiatal hernia    History of chicken pox    History of concussion    age 89 -- no residual   History of DVT of lower extremity 03/21/2016  treated and completed w/ xarelto   per doppler left extensive occlusion common femoral, femoral, and popliteal veins and right partial occlusion common femoral and profunda femoral veins/  last doppler 06-04-2016 no evidence acute or chronic dvt noted either leg    History of radiation therapy 06/09/16-06/23/16   lower thoracic spine 25 Gy in 10 fractions   Mouth ulcers    secondary to radiation   Multiple myeloma ([place]) dx 02/22/2016 via bone marrow bx---  oncologist-  [name]   IgG Kappa-- Hyperdiploid/ +11 w/ bone mets--  current treatment chemotherapy (started 08/ 2017)and pallitive radiation to back started 06-09-2016   Renal calculus, right    Wears contact lenses    Past Surgical History:  Procedure Laterality Date   COLONOSCOPY  2008,2013   COLONOSCOPY  01/07/2017   Dr Penelope Coop, [facility]   CYSTOSCOPY W/ URETERAL STENT PLACEMENT Right 06/20/2016   Procedure: CYSTOSCOPY WITH STENT REPLACEMENT;  Surgeon: Kathie Rhodes, MD;  Location: [facility];  Service: Urology;  Laterality: Right;   CYSTOSCOPY WITH RETROGRADE PYELOGRAM, URETEROSCOPY AND STENT PLACEMENT Right 05/30/2016   Procedure: CYSTOSCOPY WITH RETROGRADE PYELOGRAM, URETEROSCOPY AND STENT PLACEMENT,DILITATION URETERAL STRICTURE;  Surgeon: Kathie Rhodes, MD;  Location: [facility];  Service: Urology;  Laterality: Right;   CYSTOSCOPY/RETROGRADE/URETEROSCOPY/STONE EXTRACTION WITH BASKET Right 06/20/2016   Procedure: CYSTOSCOPY/URETEROSCOPY/STONE  EXTRACTION WITH BASKET;  Surgeon: Kathie Rhodes, MD;  Location: [facility];  Service: Urology;  Laterality: Right;   HIATAL HERNIA REPAIR N/A 08/13/2018   Procedure: LAPAROSCOPIC REPAIR OF HIATAL HERNIA REPAIR WITH FUNDOPLICATION AND INSERTION OF MESH;  Surgeon: Excell Seltzer, MD;  Location: [facility];  Service: General;  Laterality: N/A;   HOLMIUM LASER APPLICATION Right 73/10/2874   Procedure: HOLMIUM LASER APPLICATION;  Surgeon: Kathie Rhodes, MD;  Location: [facility];  Service: Urology;  Laterality: Right;   IR GENERIC HISTORICAL  02/11/2016   IR RADIOLOGIST EVAL & MGMT 02/11/2016 [facility]   IR GENERIC HISTORICAL  02/15/2016   IR BONE TUMOR(S)RF ABLATION 02/15/2016 Luanne Bras, MD [facility]   IR GENERIC HISTORICAL  02/15/2016   IR BONE TUMOR(S)RF ABLATION 02/15/2016 Luanne Bras, MD [facility]   IR GENERIC HISTORICAL  02/15/2016   IR BONE  TUMOR(S)RF ABLATION 02/15/2016 Luanne Bras, MD [facility]   IR GENERIC HISTORICAL  02/15/2016   IR KYPHO THORACIC WITH BONE BIOPSY 02/15/2016 Luanne Bras, MD [facility]   IR GENERIC HISTORICAL  02/15/2016   IR KYPHO THORACIC WITH BONE BIOPSY 02/15/2016 Luanne Bras, MD [facility]   IR GENERIC HISTORICAL  02/15/2016   IR VERTEBROPLASTY CERV/THOR BX INC UNI/BIL INC/INJECT/IMAGING 02/15/2016 Luanne Bras, MD [facility]   IR GENERIC HISTORICAL  03/13/2016   IR KYPHO EA ADDL LEVEL THORACIC OR LUMBAR 03/13/2016 Luanne Bras, MD [facility]   IR GENERIC HISTORICAL  03/13/2016   IR KYPHO EA ADDL LEVEL THORACIC OR LUMBAR 03/13/2016 Luanne Bras, MD [facility]   IR GENERIC HISTORICAL  03/13/2016   IR BONE TUMOR(S)RF ABLATION 03/13/2016 Luanne Bras, MD [facility]   IR GENERIC HISTORICAL  03/13/2016   IR KYPHO LUMBAR INC FX REDUCE BONE BX UNI/BIL CANNULATION INC/IMAGING 03/13/2016 Luanne Bras, MD [facility]   IR GENERIC HISTORICAL  03/13/2016   IR BONE TUMOR(S)RF ABLATION  03/13/2016 Luanne Bras, MD [facility]   IR GENERIC HISTORICAL  03/13/2016   IR BONE TUMOR(S)RF ABLATION 03/13/2016 Luanne Bras, MD [facility]   IR GENERIC HISTORICAL  03/31/2016   IR RADIOLOGIST EVAL & MGMT 03/31/2016 [facility]   KYPHOPLASTY     02/2016, 03/2016, 11/2016   LAPAROSCOPIC INGUINAL HERNIA REPAIR Bilateral 12-16-2013  dr gross   RADIOLOGY WITH ANESTHESIA N/A 02/15/2016   Procedure: Spinal Ablation;  Surgeon: Luanne Bras, MD;  Location: [place];  Service: Radiology;  Laterality: N/A;   RADIOLOGY WITH ANESTHESIA N/A 03/13/2016   Procedure: LUMBER ABLATION;  Surgeon: Luanne Bras, MD;  Location: [place];  Service: Radiology;  Laterality: N/A;   ROTATOR CUFF REPAIR Right 2003   spine operation     x1 in 2018 x3 in 2017   TONSILLECTOMY  age 38   VERTEBROPLASTY  01/2016   WISDOM TOOTH EXTRACTION     Family History  Problem Relation Age of Onset   Uterine cancer Mother    Ovarian cancer Mother    Colon cancer Mother        said it was rectal or colon but not sure   Rectal cancer Mother    Heart disease Father    Hypertension Father    Multiple sclerosis Sister    Paranoid behavior Brother    Drug abuse Brother    Schizophrenia Brother    Stroke Maternal Grandfather    Cancer Maternal Aunt    Leukemia Paternal Aunt    Healthy Son        x1   Healthy Daughter        x2   Allergies Daughter        x1   Diabetes Neg Hx    Alzheimer's disease Neg Hx    Parkinson's disease Neg Hx    Esophageal cancer Neg Hx    Stomach cancer Neg Hx    Social History   Socioeconomic History   Marital status: Married    Spouse name: Not on file   Number of children: 3   Years of education: Not on file   Highest education level: Not on file  Occupational History   Not on file  Tobacco Use   Smoking status: Former    Packs/day: 1.00    Years: 7.00    Pack years: 7.00    Types: Cigarettes    Quit date: 11/02/1980    Years since quitting: 40.6   Smokeless  tobacco: Never  Vaping Use   Vaping Use: Never used  Substance and Sexual Activity   Alcohol use: Yes    Comment: occasional   Drug use: No   Sexual activity: Never  Other Topics Concern   Not on file  Social History Narrative   Not on file   Social Determinants of Health   Financial Resource Strain: Low Risk    Difficulty of Paying Living Expenses: Not hard at all  Food Insecurity: No Food Insecurity   Worried About Charity fundraiser in the Last Year: Never true   [place] in the Last Year: Never true  Transportation Needs: No Transportation Needs   Lack of Transportation (Medical): No   Lack of Transportation (Non-Medical): No  Physical Activity: Sufficiently Active   Days of Exercise per Week: 7 days   Minutes of Exercise per Session: 60 min  Stress: No Stress Concern Present   Feeling of Stress : Not at all  Social Connections: Moderately Integrated   Frequency of Communication with Friends and Family: More than three times a week   Frequency of Social Gatherings with Friends and Family: More than three times a week   Attends Religious Services: More than 4 times per year   Active Member of [employer] or Organizations: No   Attends Music therapist: Never   Marital Status: Married    Tobacco Counseling Counseling given: Not Answered   Clinical Intake:  Pre-visit preparation completed: Yes  Pain : 0-10 Pain Score: 3  Pain Type: Chronic pain Pain Location: Shoulder (and elbow & back) Pain Orientation: Right Pain Onset: More than a month ago Pain Frequency: Constant     BMI - recorded: 28.98 Nutritional Status: BMI 25 -29 Overweight Nutritional Risks: None  How often do you need to have someone help you when you read instructions, pamphlets, or other written materials from your doctor or pharmacy?: 1 - Never  Diabetic?No  Interpreter Needed?: No  Information entered by :: Caroleen Hamman LPN   Activities of Daily Living In your  present state of health, do you have any difficulty performing the following activities: 07/02/2021  Hearing? N  Vision? N  Difficulty concentrating or making decisions? N  Walking or climbing stairs? N  Dressing or bathing? N  Doing errands, shopping? N  Preparing Food and eating ? N  Using the Toilet? N  In the past six months, have you accidently leaked urine? N  Do you have problems with loss of bowel control? N  Managing your Medications? N  Managing your Finances? N  Housekeeping or managing your Housekeeping? N  Some recent data might be hidden    Patient Care Team: Saguier, Iris Pert as PCP - General (Internal Medicine) Corine Shelter, PA-C as Physician Assistant (Physician Assistant)  Indicate any recent Medical Services you may have received from other than [facility] providers in the past year (date may be approximate).     Assessment:   This is a routine wellness examination for Yamin.  Hearing/Vision screen Hearing Screening - Comments:: No issues Vision Screening - Comments:: Last eye exam- 12/2020  Dietary issues and exercise activities discussed: Current Exercise Habits: Home exercise routine, Type of exercise: walking, Time (Minutes): 60, Frequency (Times/Week): 7, Weekly Exercise (Minutes/Week): 420, Intensity: Mild, Exercise limited by: None identified   Goals Addressed             This Visit's Progress    Patient Stated  Drink more water & continue walking & add more stretching       Depression Screen PHQ 2/9 Scores 07/02/2021 01/09/2017 06/02/2016  PHQ - 2 Score 0 0 2  PHQ- 9 Score - 0 -    Fall Risk Fall Risk  07/02/2021 08/04/2016 07/09/2016 07/02/2016 06/02/2016  Falls in the past year? 1 No No No No  Number falls in past yr: 0 - - - -  Injury with Fall? 0 - - - -  Risk for fall due to : History of fall(s) - - - -  Follow up Falls prevention discussed - - - -    FALL RISK PREVENTION PERTAINING TO THE HOME:  Any stairs in or around  the home? Yes  If so, are there any without handrails? No  Home free of loose throw rugs in walkways, pet beds, electrical cords, etc? No  Adequate lighting in your home to reduce risk of falls? Yes   ASSISTIVE DEVICES UTILIZED TO PREVENT FALLS:  Life alert? No  Use of a cane, walker or w/c? No  Grab bars in the bathroom? Yes  Shower chair or bench in shower? Yes  Elevated toilet seat or a handicapped toilet? Yes   TIMED UP AND GO:  Was the test performed? No . Phone visit   Cognitive Function:        Immunizations Immunization History  Administered Date(s) Administered   DTaP / HiB / IPV 08/19/2017, 10/14/2017, 02/24/2018   Hepatitis B, adult 08/19/2017, 10/14/2017, 02/24/2018   Influenza, High Dose Seasonal PF 03/28/2021   Influenza,inj,Quad PF,6+ Mos 04/11/2016, 04/07/2017, 04/20/2018, 03/29/2019   Influenza-Unspecified 04/11/2016, 04/07/2017, 04/20/2018, 04/27/2018, 03/29/2019, 05/07/2020   Moderna Sars-Covid-2 Vaccination 08/16/2020   Pfizer Covid-19 Vaccine Bivalent Booster 70yr & up 03/28/2021   Pneumococcal Conjugate-13 02/18/2017, 08/19/2017, 10/14/2017, 02/24/2018   Pneumococcal Polysaccharide-23 08/30/2018   Zoster Recombinat (Shingrix) 01/05/2017, 04/07/2017    TDAP status: Due, Education has been provided regarding the importance of this vaccine. Advised may receive this vaccine at local pharmacy or Health Dept. Aware to provide a copy of the vaccination record if obtained from local pharmacy or Health Dept. Verbalized acceptance and understanding.  Flu Vaccine status: Up to date  Pneumococcal vaccine status: Up to date  Covid-19 vaccine status: Completed vaccines  Qualifies for Shingles Vaccine? No   Zostavax completed No   Shingrix Completed?: Yes  Screening Tests Health Maintenance  Topic Date Due   Hepatitis C Screening  Never done   TETANUS/TDAP  Never done   COVID-19 Vaccine (3 - Mixed Product risk series) 04/25/2021   Pneumonia Vaccine 65  Years old (3 - PPSV23 if available, else PCV20) 08/31/2023   COLONOSCOPY (Pts [id number] coverage will need to be confirmed)  01/08/2027   INFLUENZA VACCINE  Completed   HIV Screening  Completed   Zoster Vaccines- Shingrix  Completed   HPV VACCINES  Aged Out    Health Maintenance  Health Maintenance Due  Topic Date Due   Hepatitis C Screening  Never done   TETANUS/TDAP  Never done   COVID-19 Vaccine (3 - Mixed Product risk series) 04/25/2021    Colorectal cancer screening: Type of screening: Colonoscopy. Completed 01/07/2017. Repeat every 5 years  Lung Cancer Screening: (Low Dose CT Chest recommended if Age 65-80ears, 30 pack-year currently smoking OR have quit w/in 15years.) does not qualify.     Additional Screening:  Hepatitis C Screening: does qualify; Patient to discuss with PCP  Vision Screening: Recommended annual ophthalmology exams for  early detection of glaucoma and other disorders of the eye. Is the patient up to date with their annual eye exam?  Yes  Who is the provider or what is the name of the office in which the patient attends annual eye exams? Patient unsure of name   Dental Screening: Recommended annual dental exams for proper oral hygiene  Community Resource Referral / Chronic Care Management: CRR required this visit?  No   CCM required this visit?  No      Plan:     I have personally reviewed and noted the following in the patients chart:   Medical and social history Use of alcohol, tobacco or illicit drugs  Current medications and supplements including opioid prescriptions. Patient is not currently taking opioid prescriptions. Functional ability and status Nutritional status Physical activity Advanced directives List of other physicians Hospitalizations, surgeries, and ER visits in previous 12 months Vitals Screenings to include cognitive, depression, and falls Referrals and appointments  In addition, I have reviewed and  discussed with patient certain preventive protocols, quality metrics, and best practice recommendations. A written personalized care plan for preventive services as well as general preventive health recommendations were provided to patient.   Due to this being a virtual visit, the after visit summary with patients personalized plan was offered to patient via mail or my-chart.  Patient would like to access on my-chart.   Marta Antu, LPN   [id number]  Nurse Health Advisor  Nurse Notes: None  Review and Agree with assessment & plan of LPN   Mackie Pai, PA-C

## 2021-07-02 NOTE — Patient Instructions (Signed)
[name] Jorge [name] , Thank you for taking time to complete your Medicare Wellness Visit. I appreciate your ongoing commitment to your health goals. Please review the following plan we discussed and let me know if I can assist you in the future.   Screening recommendations/referrals: Colonoscopy:  Completed 01/07/2017-Due 01/07/2022 Recommended yearly ophthalmology/optometry visit for glaucoma screening and checkup Recommended yearly dental visit for hygiene and checkup  Vaccinations: Influenza vaccine: Up to date Pneumococcal vaccine: Up to date Tdap vaccine: Discuss with pharmacy Shingles vaccine: Completed vaccines   Covid-19: Up to date  Advanced directives: Copy in chart  Conditions/risks identified: See problem list  Next appointment: Follow up in one year for your annual wellness visit.   Preventive Care 65 Years and Older, Male Preventive care refers to lifestyle choices and visits with your health care provider that can promote health and wellness. What does preventive care include? A yearly physical exam. This is also called an annual well check. Dental exams once or twice a year. Routine eye exams. Ask your health care provider how often you should have your eyes checked. Personal lifestyle choices, including: Daily care of your teeth and gums. Regular physical activity. Eating a healthy diet. Avoiding tobacco and drug use. Limiting alcohol use. Practicing safe sex. Taking low doses of aspirin every day. Taking vitamin and mineral supplements as recommended by your health care provider. What happens during an annual well check? The services and screenings done by your health care provider during your annual well check will depend on your age, overall health, lifestyle risk factors, and family history of disease. Counseling  Your health care provider may ask you questions about your: Alcohol use. Tobacco use. Drug use. Emotional well-being. Home and relationship  well-being. Sexual activity. Eating habits. History of falls. Memory and ability to understand (cognition). Work and work Statistician. Screening  You may have the following tests or measurements: Height, weight, and BMI. Blood pressure. Lipid and cholesterol levels. These may be checked every 5 years, or more frequently if you are over 31 years old. Skin check. Lung cancer screening. You may have this screening every year starting at age 54 if you have a 30-pack-year history of smoking and currently smoke or have quit within the past 15 years. Fecal occult blood test (FOBT) of the stool. You may have this test every year starting at age 60. Flexible sigmoidoscopy or colonoscopy. You may have a sigmoidoscopy every 5 years or a colonoscopy every 10 years starting at age 101. Prostate cancer screening. Recommendations will vary depending on your family history and other risks. Hepatitis C blood test. Hepatitis B blood test. Sexually transmitted disease (STD) testing. Diabetes screening. This is done by checking your blood sugar (glucose) after you have not eaten for a while (fasting). You may have this done every 1-3 years. Abdominal aortic aneurysm (AAA) screening. You may need this if you are a current or former smoker. Osteoporosis. You may be screened starting at age 36 if you are at high risk. Talk with your health care provider about your test results, treatment options, and if necessary, the need for more tests. Vaccines  Your health care provider may recommend certain vaccines, such as: Influenza vaccine. This is recommended every year. Tetanus, diphtheria, and acellular pertussis (Tdap, Td) vaccine. You may need a Td booster every 10 years. Zoster vaccine. You may need this after age 39. Pneumococcal 13-valent conjugate (PCV13) vaccine. One dose is recommended after age 34. Pneumococcal polysaccharide (PPSV23) vaccine. One dose is  recommended after age 88. Talk to your health care  provider about which screenings and vaccines you need and how often you need them. This information is not intended to replace advice given to you by your health care provider. Make sure you discuss any questions you have with your health care provider. Document Released: 07/27/2015 Document Revised: 03/19/2016 Document Reviewed: 05/01/2015 Elsevier Interactive Patient Education  2017 [employer] Prevention in the Home Falls can cause injuries. They can happen to people of all ages. There are many things you can do to make your home safe and to help prevent falls. What can I do on the outside of my home? Regularly fix the edges of walkways and driveways and fix any cracks. Remove anything that might make you trip as you walk through a door, such as a raised step or threshold. Trim any bushes or trees on the path to your home. Use bright outdoor lighting. Clear any walking paths of anything that might make someone trip, such as rocks or tools. Regularly check to see if handrails are loose or broken. Make sure that both sides of any steps have handrails. Any raised decks and porches should have guardrails on the edges. Have any leaves, snow, or ice cleared regularly. Use sand or salt on walking paths during winter. Clean up any spills in your garage right away. This includes oil or grease spills. What can I do in the bathroom? Use night lights. Install grab bars by the toilet and in the tub and shower. Do not use towel bars as grab bars. Use non-skid mats or decals in the tub or shower. If you need to sit down in the shower, use a plastic, non-slip stool. Keep the floor dry. Clean up any water that spills on the floor as soon as it happens. Remove soap buildup in the tub or shower regularly. Attach bath mats securely with double-sided non-slip rug tape. Do not have throw rugs and other things on the floor that can make you trip. What can I do in the bedroom? Use night lights. Make  sure that you have a light by your bed that is easy to reach. Do not use any sheets or blankets that are too big for your bed. They should not hang down onto the floor. Have a firm chair that has side arms. You can use this for support while you get dressed. Do not have throw rugs and other things on the floor that can make you trip. What can I do in the kitchen? Clean up any spills right away. Avoid walking on wet floors. Keep items that you use a lot in easy-to-reach places. If you need to reach something above you, use a strong step stool that has a grab bar. Keep electrical cords out of the way. Do not use floor polish or wax that makes floors slippery. If you must use wax, use non-skid floor wax. Do not have throw rugs and other things on the floor that can make you trip. What can I do with my stairs? Do not leave any items on the stairs. Make sure that there are handrails on both sides of the stairs and use them. Fix handrails that are broken or loose. Make sure that handrails are as long as the stairways. Check any carpeting to make sure that it is firmly attached to the stairs. Fix any carpet that is loose or worn. Avoid having throw rugs at the top or bottom of the stairs. If  you do have throw rugs, attach them to the floor with carpet tape. Make sure that you have a light switch at the top of the stairs and the bottom of the stairs. If you do not have them, ask someone to add them for you. What else can I do to help prevent falls? Wear shoes that: Do not have high heels. Have rubber bottoms. Are comfortable and fit you well. Are closed at the toe. Do not wear sandals. If you use a stepladder: Make sure that it is fully opened. Do not climb a closed stepladder. Make sure that both sides of the stepladder are locked into place. Ask someone to hold it for you, if possible. Clearly mark and make sure that you can see: Any grab bars or handrails. First and last steps. Where the  edge of each step is. Use tools that help you move around (mobility aids) if they are needed. These include: Canes. Walkers. Scooters. Crutches. Turn on the lights when you go into a dark area. Replace any light bulbs as soon as they burn out. Set up your furniture so you have a clear path. Avoid moving your furniture around. If any of your floors are uneven, fix them. If there are any pets around you, be aware of where they are. Review your medicines with your doctor. Some medicines can make you feel dizzy. This can increase your chance of falling. Ask your doctor what other things that you can do to help prevent falls. This information is not intended to replace advice given to you by your health care provider. Make sure you discuss any questions you have with your health care provider. Document Released: 04/26/2009 Document Revised: 12/06/2015 Document Reviewed: 08/04/2014 Elsevier Interactive Patient Education  2017 [employer].

## 2021-07-11 ENCOUNTER — Inpatient Hospital Stay (HOSPITAL_BASED_OUTPATIENT_CLINIC_OR_DEPARTMENT_OTHER): Payer: Medicare Other | Admitting: Hematology & Oncology

## 2021-07-11 ENCOUNTER — Inpatient Hospital Stay: Payer: Medicare Other

## 2021-07-11 ENCOUNTER — Inpatient Hospital Stay: Payer: Medicare Other | Attending: Hematology & Oncology

## 2021-07-11 ENCOUNTER — Encounter: Payer: Self-pay | Admitting: Hematology & Oncology

## 2021-07-11 ENCOUNTER — Other Ambulatory Visit: Payer: Self-pay

## 2021-07-11 VITALS — BP 122/78 | HR 59 | Temp 98.4°F | Resp 20 | Wt 195.0 lb

## 2021-07-11 DIAGNOSIS — C9000 Multiple myeloma not having achieved remission: Secondary | ICD-10-CM

## 2021-07-11 DIAGNOSIS — Z9484 Stem cells transplant status: Secondary | ICD-10-CM | POA: Insufficient documentation

## 2021-07-11 DIAGNOSIS — D509 Iron deficiency anemia, unspecified: Secondary | ICD-10-CM | POA: Diagnosis not present

## 2021-07-11 DIAGNOSIS — Z79899 Other long term (current) drug therapy: Secondary | ICD-10-CM | POA: Diagnosis not present

## 2021-07-11 DIAGNOSIS — D500 Iron deficiency anemia secondary to blood loss (chronic): Secondary | ICD-10-CM | POA: Diagnosis not present

## 2021-07-11 DIAGNOSIS — Z7901 Long term (current) use of anticoagulants: Secondary | ICD-10-CM | POA: Diagnosis not present

## 2021-07-11 DIAGNOSIS — R197 Diarrhea, unspecified: Secondary | ICD-10-CM | POA: Insufficient documentation

## 2021-07-11 DIAGNOSIS — Z86718 Personal history of other venous thrombosis and embolism: Secondary | ICD-10-CM | POA: Diagnosis not present

## 2021-07-11 LAB — CMP (CANCER CENTER ONLY)
ALT: 53 U/L — ABNORMAL HIGH (ref 0–44)
AST: 53 U/L — ABNORMAL HIGH (ref 15–41)
Albumin: 4.1 g/dL (ref 3.5–5.0)
Alkaline Phosphatase: 49 U/L (ref 38–126)
Anion gap: 8 (ref 5–15)
BUN: 11 mg/dL (ref 8–23)
CO2: 26 mmol/L (ref 22–32)
Calcium: 9.3 mg/dL (ref 8.9–10.3)
Chloride: 105 mmol/L (ref 98–111)
Creatinine: 0.95 mg/dL (ref 0.61–1.24)
GFR, Estimated: >60 mL/min (ref >60)
Glucose, Bld: 97 mg/dL (ref 70–99)
Potassium: 3.8 mmol/L (ref 3.5–5.1)
Sodium: 139 mmol/L (ref 135–145)
Total Bilirubin: 0.9 mg/dL (ref 0.3–1.2)
Total Protein: 6.8 g/dL (ref 6.5–8.1)

## 2021-07-11 LAB — CBC WITH DIFFERENTIAL (CANCER CENTER ONLY)
Abs Immature Granulocytes: 0.02 10*3/uL (ref 0.00–0.07)
Basophils Absolute: 0 10*3/uL (ref 0.0–0.1)
Basophils Relative: 1 %
Eosinophils Absolute: 0.2 10*3/uL (ref 0.0–0.5)
Eosinophils Relative: 7 %
HCT: 42.5 % (ref 39.0–52.0)
Hemoglobin: 14.7 g/dL (ref 13.0–17.0)
Immature Granulocytes: 1 %
Lymphocytes Relative: 44 %
Lymphs Abs: 1.5 10*3/uL (ref 0.7–4.0)
MCH: 34.8 pg — ABNORMAL HIGH (ref 26.0–34.0)
MCHC: 34.6 g/dL (ref 30.0–36.0)
MCV: 100.7 fL — ABNORMAL HIGH (ref 80.0–100.0)
Monocytes Absolute: 0.5 10*3/uL (ref 0.1–1.0)
Monocytes Relative: 15 %
Neutro Abs: 1 10*3/uL — ABNORMAL LOW (ref 1.7–7.7)
Neutrophils Relative %: 32 %
Platelet Count: 106 10*3/uL — ABNORMAL LOW (ref 150–400)
RBC: 4.22 MIL/uL (ref 4.22–5.81)
RDW: 13.3 % (ref 11.5–15.5)
WBC Count: 3.2 10*3/uL — ABNORMAL LOW (ref 4.0–10.5)
nRBC: 0 % (ref 0.0–0.2)

## 2021-07-11 LAB — IRON AND IRON BINDING CAPACITY (CC-WL,HP ONLY)
Iron: 128 ug/dL (ref 45–182)
Saturation Ratios: 35 % (ref 17.9–39.5)
TIBC: 361 ug/dL (ref 250–450)
UIBC: 233 ug/dL (ref 117–376)

## 2021-07-11 LAB — RETICULOCYTES
Immature Retic Fract: 12.7 % (ref 2.3–15.9)
RBC.: 4.15 MIL/uL — ABNORMAL LOW (ref 4.22–5.81)
Retic Count, Absolute: 80.1 10*3/uL (ref 19.0–186.0)
Retic Ct Pct: 1.9 % (ref 0.4–3.1)

## 2021-07-11 LAB — LACTATE DEHYDROGENASE: LDH: 226 U/L — ABNORMAL HIGH (ref 98–192)

## 2021-07-11 MED ORDER — SODIUM CHLORIDE 0.9 % IV SOLN: INTRAVENOUS | Status: DC

## 2021-07-11 MED ORDER — ZOLEDRONIC ACID 4 MG/100ML IV SOLN
4.0000 mg | Freq: Once | INTRAVENOUS | Status: AC
  Administered 2021-07-11: 14:00:00 4 mg via INTRAVENOUS
  Filled 2021-07-11: qty 100

## 2021-07-11 NOTE — Patient Instructions (Signed)

## 2021-07-11 NOTE — Progress Notes (Signed)
Hematology and Oncology Follow Up Visit  Jorge [name] [id number] [birth year] 65 y.o. 07/11/2021   Principle Diagnosis:  IgG Kappa myeloma - Hyperdiploid/+11 DVT of the LEFT and RIGHT leg  Iron deficiency anemia  Current Therapy:   Revlimid 54m po q day (21 on/7 off) - start on 04/07/2018   Zometa 4 mg IV q 3 months - next dose is 09/2021 Xarelto 10 mg PO daily IV Iron as needed   Interim History:  Mr. PWyndhamis here today for follow-up.  As always, he is doing pretty well.  He had a wonderful Christmas and Thanksgiving.  He is with family.  He always enjoys this.  [place] actually was out in [place] after [place] saw him back in September.  He was out there for about a week.  He was busy dealing with the estate of a relative.  He still has little bit of diarrhea with the Revlimid.  I think that he is on Imodium for this.  Says he is having little more in the way of bony discomfort.  He is still walking every day.  When we  last saw him, there was no monoclonal spike in his blood.  His IgG level was 1184 mg/dL.  The Kappa light chain was 3.7 mg/dL.  He has had no fever.  He has had no cough or shortness of breath.  He has had no rashes.  There has been no leg swelling.  Overall, I would say his performance status is probably ECOG 0.   Medications:  Allergies as of 07/11/2021       Reactions   Singulair [montelukast] Other (See Comments)   Mood change        Medication List        Accurate as of July 11, 2021  1:08 PM. If you have any questions, ask your nurse or doctor.          STOP taking these medications    OVER THE COUNTER MEDICATION Stopped by: [name]       TAKE these medications    acetaminophen 325 MG tablet Commonly known as: TYLENOL Take 2 tablets (650 mg total) by mouth every 6 (six) hours as needed for mild pain (or Fever >/= 101).   albuterol 108 (90 Base) MCG/ACT inhaler Commonly known as: VENTOLIN HFA TAKE 2 PUFFS BY MOUTH  EVERY 6 HOURS AS NEEDED FOR WHEEZE OR SHORTNESS OF BREATH   amLODipine 5 MG tablet Commonly known as: NORVASC Take by mouth daily.   benzonatate 100 MG capsule Commonly known as: TESSALON Take 1 capsule (100 mg total) by mouth 3 (three) times daily as needed for cough.   cetirizine 10 MG tablet Commonly known as: ZYRTEC Take 10 mg by mouth daily as needed for allergies.   fluorouracil 5 % cream Commonly known as: EFUDEX APPLY TO AFFECTED AREA TWICE A DAY   fluticasone 50 MCG/ACT nasal spray Commonly known as: FLONASE SPRAY 2 SPRAYS INTO EACH NOSTRIL EVERY DAY   guaiFENesin-codeine 100-10 MG/5ML syrup Take 10 mLs by mouth every 6 (six) hours as needed for cough.   IMODIUM PO Take by mouth daily.   ipratropium 0.03 % nasal spray Commonly known as: ATROVENT Place 1-2 sprays into both nostrils 2 (two) times daily as needed (nasal drainage).   lenalidomide 5 MG capsule Commonly known as: Revlimid TAKE 1 CAPSULE BY MOUTH DAILY  FOR 21 DAYS, THEN 7 DAYS OFF. Auth ##[id number]  lidocaine 5 % Commonly known  as: LIDODERM Place 1 patch onto the skin daily as needed (pain).   Symbicort 160-4.5 MCG/ACT inhaler Generic drug: budesonide-formoterol TAKE 2 PUFFS BY MOUTH TWICE A DAY   valsartan 160 MG tablet Commonly known as: DIOVAN TAKE 1 TABLET BY MOUTH EVERY DAY   Vitamin D3 250 MCG (10000 UT) capsule Take 10,000 Units by mouth daily.   Xarelto 10 MG Tabs tablet Generic drug: rivaroxaban Take 1 tablet (10 mg total) by mouth daily.   zolpidem 5 MG tablet Commonly known as: AMBIEN Take 1 tablet (5 mg total) by mouth at bedtime.        Allergies:  Allergies  Allergen Reactions   Singulair [Montelukast] Other (See Comments)    Mood change    Past Medical History, Surgical history, Social history, and Family History were reviewed and updated.  Review of Systems: Review of Systems  Constitutional: Negative.   HENT: Negative.   Eyes: Negative.   Respiratory:  Negative.   Cardiovascular: Negative.   Gastrointestinal: Negative.   Genitourinary: Negative.   Musculoskeletal: Positive for myalgias.  Skin: Negative.   Neurological: Negative.   Endo/Heme/Allergies: Negative.   Psychiatric/Behavioral: Negative.      Physical Exam:  weight is 195 lb 0.6 oz (88.5 kg). His oral temperature is 98.4 F (36.9 C). His blood pressure is 122/78 and his pulse is 59 (abnormal). His respiration is 20 and oxygen saturation is 97%.   Wt Readings from Last 3 Encounters:  07/11/21 195 lb 0.6 oz (88.5 kg)  07/02/21 185 lb (83.9 kg)  05/28/21 185 lb (83.9 kg)    Physical Exam Vitals signs reviewed.  HENT:     Head: Normocephalic and atraumatic.  Eyes:     Pupils: Pupils are equal, round, and reactive to light.  Neck:     Musculoskeletal: Normal range of motion.  Cardiovascular:     Rate and Rhythm: Normal rate and regular rhythm.     Heart sounds: Normal heart sounds.  Pulmonary:     Effort: Pulmonary effort is normal.     Breath sounds: Normal breath sounds.  Abdominal:     General: Bowel sounds are normal.     Palpations: Abdomen is soft.  Musculoskeletal: Normal range of motion.        General: No tenderness or deformity.  Lymphadenopathy:     Cervical: No cervical adenopathy.  Skin:    General: Skin is warm and dry.     Findings: No erythema or rash.  Neurological:     Mental Status: He is alert and oriented to person, place, and time.  Psychiatric:        Behavior: Behavior normal.        Thought Content: Thought content normal.        Judgment: Judgment normal.      Lab Results  Component Value Date   WBC 3.2 (L) 07/11/2021   HGB 14.7 07/11/2021   HCT 42.5 07/11/2021   MCV 100.7 (H) 07/11/2021   PLT 106 (L) 07/11/2021   Lab Results  Component Value Date   FERRITIN 116 04/11/2021   IRON 123 04/11/2021   TIBC 345 04/11/2021   UIBC 222 04/11/2021   IRONPCTSAT 36 04/11/2021   Lab Results  Component Value Date   RETICCTPCT  1.9 07/11/2021   RBC 4.22 07/11/2021   Lab Results  Component Value Date   KPAFRELGTCHN 37.3 (H) 04/11/2021   LAMBDASER 22.3 04/11/2021   KAPLAMBRATIO 1.67 (H) 04/11/2021   Lab Results  Component Value Date  IGGSERUM 1,184 04/11/2021   IGA 404 04/11/2021   IGMSERUM 33 04/11/2021   Lab Results  Component Value Date   TOTALPROTELP 6.5 04/11/2021   ALBUMINELP 3.8 04/11/2021   A1GS 0.2 04/11/2021   A2GS 0.5 04/11/2021   BETS 0.9 04/11/2021   GAMS 1.2 04/11/2021   MSPIKE Not Observed 04/11/2021   SPEI Comment 01/05/2018     Chemistry      Component Value Date/Time   NA 139 07/11/2021 1159   NA 143 06/30/2017 1049   NA 140 10/03/2016 0845   K 3.8 07/11/2021 1159   K 4.0 06/30/2017 1049   K 4.3 10/03/2016 0845   CL 105 07/11/2021 1159   CL 105 06/30/2017 1049   CO2 26 07/11/2021 1159   CO2 27 06/30/2017 1049   CO2 26 10/03/2016 0845   BUN 11 07/11/2021 1159   BUN 13 06/30/2017 1049   BUN 11.9 10/03/2016 0845   CREATININE 0.95 07/11/2021 1159   CREATININE 1.0 06/30/2017 1049   CREATININE 0.9 10/03/2016 0845      Component Value Date/Time   CALCIUM 9.3 07/11/2021 1159   CALCIUM 9.0 06/30/2017 1049   CALCIUM 9.9 10/03/2016 0845   ALKPHOS 49 07/11/2021 1159   ALKPHOS 57 06/30/2017 1049   ALKPHOS 80 10/03/2016 0845   AST 53 (H) 07/11/2021 1159   AST 28 10/03/2016 0845   ALT 53 (H) 07/11/2021 1159   ALT 43 06/30/2017 1049   ALT 26 10/03/2016 0845   BILITOT 0.9 07/11/2021 1159   BILITOT 0.48 10/03/2016 0845      Impression and Plan: Mr. Boliver is a pleasant 65 yo gentleman with IgG kappa myeloma.  He ultimately underwent an autologous stem cell transplant at [facility] in February 2018.   As always, he is quite busy.  Next year, it sounds like they can have a really good time up.  They will be going to [place] in October.  They will be going to [place] in August.  They will be going down to [place] for their annual trip I think in March or April.  We will see what his  myeloma studies look like.  I would have to believe that he still is in remission.  A measure why he is having the bony issues.  We will have to watch this closely.  We will plan to get him back in 3 months.   [name], MD 12/29/20221:08 PM

## 2021-07-12 LAB — FERRITIN: Ferritin: 161 ng/mL (ref 24–336)

## 2021-07-12 LAB — KAPPA/LAMBDA LIGHT CHAINS
Kappa free light chain: 34.9 mg/L — ABNORMAL HIGH (ref 3.3–19.4)
Kappa, lambda light chain ratio: 1.51 (ref 0.26–1.65)
Lambda free light chains: 23.1 mg/L (ref 5.7–26.3)

## 2021-07-12 LAB — IGG, IGA, IGM
IgA: 395 mg/dL (ref 61–437)
IgG (Immunoglobin G), Serum: 1119 mg/dL (ref 603–1613)
IgM (Immunoglobulin M), Srm: 28 mg/dL (ref 20–172)

## 2021-07-13 LAB — BETA 2 MICROGLOBULIN, SERUM: Beta-2 Microglobulin: 1.6 mg/L (ref 0.6–2.4)

## 2021-07-16 ENCOUNTER — Telehealth: Payer: Self-pay | Admitting: *Deleted

## 2021-07-16 ENCOUNTER — Other Ambulatory Visit: Payer: Self-pay | Admitting: *Deleted

## 2021-07-16 DIAGNOSIS — J329 Chronic sinusitis, unspecified: Secondary | ICD-10-CM

## 2021-07-16 LAB — PROTEIN ELECTROPHORESIS, SERUM, WITH REFLEX
A/G Ratio: 1.4 (ref 0.7–1.7)
Albumin ELP: 3.8 g/dL (ref 2.9–4.4)
Alpha-1-Globulin: 0.3 g/dL (ref 0.0–0.4)
Alpha-2-Globulin: 0.4 g/dL (ref 0.4–1.0)
Beta Globulin: 0.9 g/dL (ref 0.7–1.3)
Gamma Globulin: 1.2 g/dL (ref 0.4–1.8)
Globulin, Total: 2.8 g/dL (ref 2.2–3.9)
Total Protein ELP: 6.6 g/dL (ref 6.0–8.5)

## 2021-07-16 NOTE — Telephone Encounter (Signed)
Patient called asking about his CT scan that he and [name] discussed last week.  Orders place, approval attained and message sent to schedulers to set up.  Patient notified.

## 2021-07-17 ENCOUNTER — Encounter: Payer: Self-pay | Admitting: *Deleted

## 2021-07-17 ENCOUNTER — Other Ambulatory Visit: Payer: Self-pay

## 2021-07-17 ENCOUNTER — Ambulatory Visit (HOSPITAL_BASED_OUTPATIENT_CLINIC_OR_DEPARTMENT_OTHER)
Admission: RE | Admit: 2021-07-17 | Discharge: 2021-07-17 | Disposition: A | Discharge status: Home / Self Care | Payer: Medicare Other | Source: Ambulatory Visit | Attending: Hematology & Oncology | Admitting: Hematology & Oncology

## 2021-07-17 DIAGNOSIS — J329 Chronic sinusitis, unspecified: Secondary | ICD-10-CM | POA: Insufficient documentation

## 2021-07-22 ENCOUNTER — Telehealth: Payer: Self-pay | Admitting: Psychiatry

## 2021-07-22 DIAGNOSIS — G4701 Insomnia due to medical condition: Secondary | ICD-10-CM

## 2021-07-24 ENCOUNTER — Telehealth: Payer: Self-pay | Admitting: Medical

## 2021-07-24 ENCOUNTER — Other Ambulatory Visit: Payer: Self-pay | Admitting: *Deleted

## 2021-07-24 ENCOUNTER — Other Ambulatory Visit: Payer: Self-pay | Admitting: Medical

## 2021-07-24 DIAGNOSIS — C9000 Multiple myeloma not having achieved remission: Secondary | ICD-10-CM

## 2021-07-24 MED ORDER — LENALIDOMIDE 5 MG PO CAPS: ORAL_CAPSULE | ORAL | 0 refills | Status: DC

## 2021-07-24 MED ORDER — AMLODIPINE BESYLATE 5 MG PO TABS: 5.0000 mg | ORAL_TABLET | Freq: Every day | ORAL | 3 refills | Status: DC

## 2021-07-24 MED ORDER — AMOXICILLIN 500 MG PO CAPS: ORAL_CAPSULE | ORAL | 0 refills | Status: DC

## 2021-07-24 NOTE — Telephone Encounter (Signed)
Rx amoxicillin sent to pt pharmacy.  Mackie Pai, PA-C

## 2021-07-24 NOTE — Telephone Encounter (Signed)
Pt has an upcoming teeth cleaning at the dentist and would like meds prescribed before his appt. Please advise.

## 2021-07-24 NOTE — Telephone Encounter (Signed)
Lvm to notify pt about medication

## 2021-07-31 NOTE — Telephone Encounter (Signed)
Dr Clovis Pu advised the patient should be able to get this script filled by his PCP.

## 2021-08-01 ENCOUNTER — Telehealth: Payer: Self-pay | Admitting: Medical

## 2021-08-01 NOTE — Telephone Encounter (Signed)
Patient states he needs a refill for Ambien, which was previously given to him by Dr. Clovis Pu but he hasn't seen Dr. Clovis Pu in over year so would like to know if Jorge [name] would refill it for him. He states he will not continue care with Dr. Clovis Pu since he and MD agreed he no longer needs it. Please advice.

## 2021-08-01 NOTE — Telephone Encounter (Signed)
Patient is coming in 08/06/21 to give UDS and sign contract

## 2021-08-02 ENCOUNTER — Telehealth: Payer: Self-pay | Admitting: *Deleted

## 2021-08-02 DIAGNOSIS — Z79899 Other long term (current) drug therapy: Secondary | ICD-10-CM

## 2021-08-02 NOTE — Telephone Encounter (Signed)
Orders dropped

## 2021-08-02 NOTE — Telephone Encounter (Signed)
Pt has lab appointment on Tuesday but no future orders in Epic.  Please place orders if appropriate.

## 2021-08-05 ENCOUNTER — Encounter: Payer: Self-pay | Admitting: Allergy

## 2021-08-05 NOTE — Progress Notes (Signed)
Reviewed notes from Dr. Benjamine Mola. Date of service: 07/23/2021. See scanned notes for full documentation. Chronic rhinitis with nasal mucosal congestion and postnasal drainage. Continue Flonase and Atrovent.  Gabapentin 300 mg at night.

## 2021-08-06 ENCOUNTER — Other Ambulatory Visit: Payer: Medicare Other

## 2021-08-06 ENCOUNTER — Telehealth: Payer: Self-pay

## 2021-08-06 DIAGNOSIS — Z79899 Other long term (current) drug therapy: Secondary | ICD-10-CM

## 2021-08-06 MED ORDER — ZOLPIDEM TARTRATE 5 MG PO TABS: 5.0000 mg | ORAL_TABLET | Freq: Every evening | ORAL | 1 refills | Status: DC | PRN

## 2021-08-06 NOTE — Addendum Note (Signed)
Addended by: Anabel Halon on: 08/06/2021 11:29 AM   Modules accepted: Orders

## 2021-08-06 NOTE — Telephone Encounter (Signed)
Patient signed contract and gave UDS today

## 2021-08-06 NOTE — Telephone Encounter (Signed)
PA initiated via Covermymeds; KEY: [id number]. Awaiting determination.

## 2021-08-06 NOTE — Telephone Encounter (Signed)
Called pt and notified him of medication

## 2021-08-07 NOTE — Telephone Encounter (Signed)
PA denied. Pt must have tried and failed two of the following drugs: Belsomra, Dayvigo, and ramelteon.   Appeals will need to be done in writing and faxed to [employer] Appeals and Grievances Dept at [phone]

## 2021-08-08 ENCOUNTER — Inpatient Hospital Stay: Payer: Medicare Other | Attending: Hematology & Oncology

## 2021-08-08 ENCOUNTER — Other Ambulatory Visit: Payer: Self-pay

## 2021-08-08 ENCOUNTER — Other Ambulatory Visit: Payer: Self-pay | Admitting: *Deleted

## 2021-08-08 DIAGNOSIS — J329 Chronic sinusitis, unspecified: Secondary | ICD-10-CM

## 2021-08-08 DIAGNOSIS — C9000 Multiple myeloma not having achieved remission: Secondary | ICD-10-CM | POA: Insufficient documentation

## 2021-08-08 LAB — CMP (CANCER CENTER ONLY)
ALT: 51 U/L — ABNORMAL HIGH (ref 0–44)
AST: 47 U/L — ABNORMAL HIGH (ref 15–41)
Albumin: 4.5 g/dL (ref 3.5–5.0)
Alkaline Phosphatase: 51 U/L (ref 38–126)
Anion gap: 8 (ref 5–15)
BUN: 12 mg/dL (ref 8–23)
CO2: 28 mmol/L (ref 22–32)
Calcium: 10 mg/dL (ref 8.9–10.3)
Chloride: 104 mmol/L (ref 98–111)
Creatinine: 1.04 mg/dL (ref 0.61–1.24)
GFR, Estimated: >60 mL/min (ref >60)
Glucose, Bld: 88 mg/dL (ref 70–99)
Potassium: 4.1 mmol/L (ref 3.5–5.1)
Sodium: 140 mmol/L (ref 135–145)
Total Bilirubin: 1.1 mg/dL (ref 0.3–1.2)
Total Protein: 7.3 g/dL (ref 6.5–8.1)

## 2021-08-08 LAB — DRUG MONITORING PANEL 376104, URINE
Amphetamines: NEGATIVE ng/mL (ref <500)
Barbiturates: NEGATIVE ng/mL (ref <300)
Benzodiazepines: NEGATIVE ng/mL (ref <100)
Cocaine Metabolite: NEGATIVE ng/mL (ref <150)
Desmethyltramadol: NEGATIVE ng/mL (ref <100)
Opiates: NEGATIVE ng/mL (ref <100)
Oxycodone: NEGATIVE ng/mL (ref <100)
Tramadol: NEGATIVE ng/mL (ref <100)

## 2021-08-08 LAB — DM TEMPLATE

## 2021-08-09 ENCOUNTER — Encounter: Payer: Self-pay | Admitting: *Deleted

## 2021-08-09 NOTE — Telephone Encounter (Signed)
Appeal faxed.  May take up to 7-30 days.

## 2021-08-12 ENCOUNTER — Encounter: Payer: Self-pay | Admitting: *Deleted

## 2021-08-20 NOTE — Telephone Encounter (Signed)
Appeal approved for 1 year starting from 08/10/21.  Ref # [id number].

## 2021-08-30 ENCOUNTER — Other Ambulatory Visit: Payer: Self-pay | Admitting: *Deleted

## 2021-08-30 DIAGNOSIS — C9000 Multiple myeloma not having achieved remission: Secondary | ICD-10-CM

## 2021-08-30 MED ORDER — LENALIDOMIDE 5 MG PO CAPS: ORAL_CAPSULE | ORAL | 0 refills | Status: DC

## 2021-09-09 ENCOUNTER — Other Ambulatory Visit: Payer: Self-pay | Admitting: Hematology & Oncology

## 2021-09-09 DIAGNOSIS — G8929 Other chronic pain: Secondary | ICD-10-CM

## 2021-09-09 DIAGNOSIS — C9000 Multiple myeloma not having achieved remission: Secondary | ICD-10-CM

## 2021-09-09 DIAGNOSIS — D472 Monoclonal gammopathy: Secondary | ICD-10-CM

## 2021-09-09 DIAGNOSIS — M545 Low back pain, unspecified: Secondary | ICD-10-CM

## 2021-09-18 ENCOUNTER — Other Ambulatory Visit: Payer: Self-pay | Admitting: Emergency Medicine

## 2021-09-20 ENCOUNTER — Other Ambulatory Visit: Payer: Self-pay | Admitting: Emergency Medicine

## 2021-09-23 ENCOUNTER — Encounter: Payer: Self-pay | Admitting: Allergy

## 2021-09-23 NOTE — Progress Notes (Signed)
Reviewed notes from Dr. Benjamine Mola. Date of service: 09/05/2021. ?See scanned notes for full documentation. ?Nasal septal deviation, bilateral inferior turbinate hypertrophy - increasing gabapentin to BID, ? surgery. ? ?

## 2021-09-24 ENCOUNTER — Ambulatory Visit: Payer: Medicare Other | Admitting: Allergy

## 2021-10-03 ENCOUNTER — Other Ambulatory Visit: Payer: Self-pay | Admitting: *Deleted

## 2021-10-03 DIAGNOSIS — C9000 Multiple myeloma not having achieved remission: Secondary | ICD-10-CM

## 2021-10-03 MED ORDER — LENALIDOMIDE 5 MG PO CAPS: ORAL_CAPSULE | ORAL | 0 refills | Status: DC

## 2021-10-09 ENCOUNTER — Inpatient Hospital Stay: Payer: Medicare Other | Attending: Hematology & Oncology | Admitting: Hematology & Oncology

## 2021-10-09 ENCOUNTER — Inpatient Hospital Stay: Payer: Medicare Other

## 2021-10-09 ENCOUNTER — Encounter: Payer: Self-pay | Admitting: Hematology & Oncology

## 2021-10-09 VITALS — BP 110/80 | HR 59 | Temp 97.8°F | Resp 18 | Wt 186.0 lb

## 2021-10-09 DIAGNOSIS — D500 Iron deficiency anemia secondary to blood loss (chronic): Secondary | ICD-10-CM

## 2021-10-09 DIAGNOSIS — Z7901 Long term (current) use of anticoagulants: Secondary | ICD-10-CM | POA: Diagnosis not present

## 2021-10-09 DIAGNOSIS — Z9484 Stem cells transplant status: Secondary | ICD-10-CM | POA: Insufficient documentation

## 2021-10-09 DIAGNOSIS — Z86718 Personal history of other venous thrombosis and embolism: Secondary | ICD-10-CM | POA: Diagnosis not present

## 2021-10-09 DIAGNOSIS — Z79899 Other long term (current) drug therapy: Secondary | ICD-10-CM | POA: Diagnosis not present

## 2021-10-09 DIAGNOSIS — D509 Iron deficiency anemia, unspecified: Secondary | ICD-10-CM | POA: Insufficient documentation

## 2021-10-09 DIAGNOSIS — C9000 Multiple myeloma not having achieved remission: Secondary | ICD-10-CM

## 2021-10-09 DIAGNOSIS — K029 Dental caries, unspecified: Secondary | ICD-10-CM | POA: Diagnosis not present

## 2021-10-09 LAB — CMP (CANCER CENTER ONLY)
ALT: 45 U/L — ABNORMAL HIGH (ref 0–44)
AST: 42 U/L — ABNORMAL HIGH (ref 15–41)
Albumin: 4.6 g/dL (ref 3.5–5.0)
Alkaline Phosphatase: 43 U/L (ref 38–126)
Anion gap: 8 (ref 5–15)
BUN: 13 mg/dL (ref 8–23)
CO2: 27 mmol/L (ref 22–32)
Calcium: 9.4 mg/dL (ref 8.9–10.3)
Chloride: 105 mmol/L (ref 98–111)
Creatinine: 0.87 mg/dL (ref 0.61–1.24)
GFR, Estimated: >60 mL/min (ref >60)
Glucose, Bld: 73 mg/dL (ref 70–99)
Potassium: 3.8 mmol/L (ref 3.5–5.1)
Sodium: 140 mmol/L (ref 135–145)
Total Bilirubin: 1 mg/dL (ref 0.3–1.2)
Total Protein: 7.1 g/dL (ref 6.5–8.1)

## 2021-10-09 LAB — CBC WITH DIFFERENTIAL (CANCER CENTER ONLY)
Abs Immature Granulocytes: 0.01 10*3/uL (ref 0.00–0.07)
Basophils Absolute: 0.1 10*3/uL (ref 0.0–0.1)
Basophils Relative: 2 %
Eosinophils Absolute: 0.2 10*3/uL (ref 0.0–0.5)
Eosinophils Relative: 6 %
HCT: 46.4 % (ref 39.0–52.0)
Hemoglobin: 15.8 g/dL (ref 13.0–17.0)
Immature Granulocytes: 0 %
Lymphocytes Relative: 51 %
Lymphs Abs: 1.7 10*3/uL (ref 0.7–4.0)
MCH: 34.4 pg — ABNORMAL HIGH (ref 26.0–34.0)
MCHC: 34.1 g/dL (ref 30.0–36.0)
MCV: 101.1 fL — ABNORMAL HIGH (ref 80.0–100.0)
Monocytes Absolute: 0.5 10*3/uL (ref 0.1–1.0)
Monocytes Relative: 14 %
Neutro Abs: 0.9 10*3/uL — ABNORMAL LOW (ref 1.7–7.7)
Neutrophils Relative %: 27 %
Platelet Count: 126 10*3/uL — ABNORMAL LOW (ref 150–400)
RBC: 4.59 MIL/uL (ref 4.22–5.81)
RDW: 13.6 % (ref 11.5–15.5)
WBC Count: 3.2 10*3/uL — ABNORMAL LOW (ref 4.0–10.5)
nRBC: 0 % (ref 0.0–0.2)

## 2021-10-09 LAB — LACTATE DEHYDROGENASE: LDH: 178 U/L (ref 98–192)

## 2021-10-09 NOTE — Progress Notes (Signed)
?Hematology and Oncology Follow Up Visit ? ?Jorge [name] ?[id number] ?02/01/1956 66 y.o. ?10/09/2021 ? ? ?Principle Diagnosis:  ?IgG Kappa myeloma - Hyperdiploid/+11 ?DVT of the LEFT and RIGHT leg  ?Iron deficiency anemia ? ?Current Therapy:   ?Revlimid 9m po q day (21 on/7 off) - start on 04/07/2018   ?Zometa 4 mg IV q 3 months - next dose is 09/2021 --on hold because of dental caries --starting 09/2021 ?Xarelto 10 mg PO daily ?IV Iron as needed ?  ?Interim History:  Jorge [name] here today for follow-up.  He is having some more back issues.  This is in the thoracic spine.  He has had fractures back there.  This was when we first saw him.  He has had kyphoplasty done. ? ?His last MRI was probably done about 4 years ago.  He may not be a bad idea to get another one set up for him. ? ?He is also worried about varicose veins in the right leg.  This leg that he has had a thrombus in.  I will see if we can somehow get these varicose veins treated. ? ?He is also concerned about his IgA level slowly trending upward.  I noticed that the trend is upward.  We have never discovered a monoclonal spike in his blood.  I told this what to look for with respect to any progression of his disease. ? ?[place]. ? ?He has had no problems with fever.  He has had no bleeding. ? ?He is worried about having cavities.  He was seen by his dentist recently.  He apparently had 4 cavities.  [place]  Because of this, we probably will have to hold his Zometa from now on.  ? ?Currently, I would have to say that his performance status is probably ECOG 1.  ? ?Medications:  ?Allergies as of 10/09/2021   ? ?   Reactions  ? Singulair [montelukast] Other (See Comments)  ? Mood change  ? ?  ? ?  ?Medication List  ?  ? ?  ? Accurate as of October 09, 2021  3:00 PM. If you have any questions, ask your nurse or doctor.  ?  ?  ? ?  ? ?acetaminophen  325 MG tablet ?Commonly known as: TYLENOL ?Take 2 tablets (650 mg total) by mouth every 6 (six) hours as needed for mild pain (or Fever >/= 101). ?  ?albuterol 108 (90 Base) MCG/ACT inhaler ?Commonly known as: VENTOLIN HFA ?TAKE 2 PUFFS BY MOUTH EVERY 6 HOURS AS NEEDED FOR WHEEZE OR SHORTNESS OF BREATH ?  ?amLODipine 5 MG tablet ?Commonly known as: NORVASC ?Take 1 tablet (5 mg total) by mouth daily. ?  ?amoxicillin 500 MG capsule ?Commonly known as: AMOXIL ?2 tab po 1 hour prior to dental procedure. ?  ?benzonatate 100 MG capsule ?Commonly known as: TESSALON ?Take 1 capsule (100 mg total) by mouth 3 (three) times daily as needed for cough. ?  ?cetirizine 10 MG tablet ?Commonly known as: ZYRTEC ?Take 10 mg by mouth daily as needed for allergies. ?  ?fluorouracil 5 % cream ?Commonly known as: EFUDEX ?APPLY TO AFFECTED AREA TWICE A DAY ?  ?fluticasone 50 MCG/ACT nasal spray ?Commonly known as: FLONASE ?SPRAY 2 SPRAYS INTO EACH NOSTRIL EVERY DAY ?  ?gabapentin 300 MG capsule ?Commonly known as: NEURONTIN ?Take 300 mg by mouth at bedtime. ?  ?  guaiFENesin-codeine 100-10 MG/5ML syrup ?Take 10 mLs by mouth every 6 (six) hours as needed for cough. ?  ?IMODIUM PO ?Take by mouth daily. ?  ?ipratropium 0.03 % nasal spray ?Commonly known as: ATROVENT ?Place 1-2 sprays into both nostrils 2 (two) times daily as needed (nasal drainage). ?  ?lenalidomide 5 MG capsule ?Commonly known as: Revlimid ?TAKE 1 CAPSULE BY MOUTH DAILY  FOR 21 DAYS, THEN 7 DAYS OFF. Auth #[id number] ?  ?lidocaine 5 % ?Commonly known as: LIDODERM ?Place 1 patch onto the skin daily as needed (pain). ?  ?Symbicort 160-4.5 MCG/ACT inhaler ?Generic drug: budesonide-formoterol ?TAKE 2 PUFFS BY MOUTH TWICE A DAY ?  ?valsartan 160 MG tablet ?Commonly known as: DIOVAN ?TAKE 1 TABLET BY MOUTH EVERY DAY ?  ?Vitamin D3 250 MCG (10000 UT) capsule ?Take 10,000 Units by mouth daily. ?  ?Xarelto 10 MG Tabs tablet ?Generic drug: rivaroxaban ?TAKE 1 TABLET BY MOUTH EVERY DAY ?   ?zolpidem 5 MG tablet ?Commonly known as: AMBIEN ?Take 1 tablet (5 mg total) by mouth at bedtime as needed for sleep. ?What changed: Another medication with the same name was removed. Continue taking this medication, and follow the directions you see here. ?Changed by: [name], MD ?  ? ?  ? ? ?Allergies:  ?Allergies  ?Allergen Reactions  ? Singulair [Montelukast] Other (See Comments)  ?  Mood change  ? ? ?Past Medical History, Surgical history, Social history, and Family History were reviewed and updated. ? ?Review of Systems: ?Review of Systems  ?Constitutional: Negative.   ?HENT: Negative.   ?Eyes: Negative.   ?Respiratory: Negative.   ?Cardiovascular: Negative.   ?Gastrointestinal: Negative.   ?Genitourinary: Negative.   ?Musculoskeletal: Positive for myalgias.  ?Skin: Negative.   ?Neurological: Negative.   ?Endo/Heme/Allergies: Negative.   ?Psychiatric/Behavioral: Negative.   ? ? ? ?Physical Exam: ? weight is 186 lb (84.4 kg). His oral temperature is 97.8 ?F (36.6 ?C). His blood pressure is 110/80 and his pulse is 59 (abnormal). His respiration is 18 and oxygen saturation is 99%.  ? ?Wt Readings from Last 3 Encounters:  ?10/09/21 186 lb (84.4 kg)  ?07/11/21 195 lb 0.6 oz (88.5 kg)  ?07/02/21 185 lb (83.9 kg)  ? ? ?Physical Exam ?Vitals signs reviewed.  ?HENT:  ?   Head: Normocephalic and atraumatic.  ?Eyes:  ?   Pupils: Pupils are equal, round, and reactive to light.  ?Neck:  ?   Musculoskeletal: Normal range of motion.  ?Cardiovascular:  ?   Rate and Rhythm: Normal rate and regular rhythm.  ?   Heart sounds: Normal heart sounds.  ?Pulmonary:  ?   Effort: Pulmonary effort is normal.  ?   Breath sounds: Normal breath sounds.  ?Abdominal:  ?   General: Bowel sounds are normal.  ?   Palpations: Abdomen is soft.  ?Musculoskeletal: Normal range of motion.     ?   General: No tenderness or deformity.  ?Lymphadenopathy:  ?   Cervical: No cervical adenopathy.  ?Skin: ?   General: Skin is warm and dry.  ?    Findings: No erythema or rash.  ?Neurological:  ?   Mental Status: He is alert and oriented to person, place, and time.  ?Psychiatric:     ?   Behavior: Behavior normal.     ?   Thought Content: Thought content normal.     ?   Judgment: Judgment normal.  ? ? ? ? ?Lab Results  ?Component Value Date  ?  WBC 3.2 (L) 10/09/2021  ? HGB 15.8 10/09/2021  ? HCT 46.4 10/09/2021  ? MCV 101.1 (H) 10/09/2021  ? PLT 126 (L) 10/09/2021  ? ?Lab Results  ?Component Value Date  ? FERRITIN 161 07/11/2021  ? IRON 128 07/11/2021  ? TIBC 361 07/11/2021  ? UIBC 233 07/11/2021  ? IRONPCTSAT 35 07/11/2021  ? ?Lab Results  ?Component Value Date  ? RETICCTPCT 1.9 07/11/2021  ? RBC 4.59 10/09/2021  ? ?Lab Results  ?Component Value Date  ? KPAFRELGTCHN 34.9 (H) 07/11/2021  ? LAMBDASER 23.1 07/11/2021  ? KAPLAMBRATIO 1.51 07/11/2021  ? ?Lab Results  ?Component Value Date  ? IGGSERUM 1,119 07/11/2021  ? IGA 395 07/11/2021  ? IGMSERUM 28 07/11/2021  ? ?Lab Results  ?Component Value Date  ? TOTALPROTELP 6.6 07/11/2021  ? ALBUMINELP 3.8 07/11/2021  ? A1GS 0.3 07/11/2021  ? A2GS 0.4 07/11/2021  ? BETS 0.9 07/11/2021  ? GAMS 1.2 07/11/2021  ? MSPIKE Not Observed 07/11/2021  ? SPEI Comment 01/05/2018  ? ?  Chemistry   ?   ?Component Value Date/Time  ? NA 140 10/09/2021 1159  ? NA 143 06/30/2017 1049  ? NA 140 10/03/2016 0845  ? K 3.8 10/09/2021 1159  ? K 4.0 06/30/2017 1049  ? K 4.3 10/03/2016 0845  ? CL 105 10/09/2021 1159  ? CL 105 06/30/2017 1049  ? CO2 27 10/09/2021 1159  ? CO2 27 06/30/2017 1049  ? CO2 26 10/03/2016 0845  ? BUN 13 10/09/2021 1159  ? BUN 13 06/30/2017 1049  ? BUN 11.9 10/03/2016 0845  ? CREATININE 0.87 10/09/2021 1159  ? CREATININE 1.0 06/30/2017 1049  ? CREATININE 0.9 10/03/2016 0845  ?    ?Component Value Date/Time  ? CALCIUM 9.4 10/09/2021 1159  ? CALCIUM 9.0 06/30/2017 1049  ? CALCIUM 9.9 10/03/2016 0845  ? ALKPHOS 43 10/09/2021 1159  ? ALKPHOS 57 06/30/2017 1049  ? ALKPHOS 80 10/03/2016 0845  ? AST 42 (H) 10/09/2021 1159  ?  AST 28 10/03/2016 0845  ? ALT 45 (H) 10/09/2021 1159  ? ALT 43 06/30/2017 1049  ? ALT 26 10/03/2016 0845  ? BILITOT 1.0 10/09/2021 1159  ? BILITOT 0.48 10/03/2016 0845  ?  ? ? ?Impression and Plan: Mr. Lofgren

## 2021-10-10 ENCOUNTER — Telehealth (HOSPITAL_BASED_OUTPATIENT_CLINIC_OR_DEPARTMENT_OTHER): Payer: Self-pay

## 2021-10-10 LAB — KAPPA/LAMBDA LIGHT CHAINS
Kappa free light chain: 35.7 mg/L — ABNORMAL HIGH (ref 3.3–19.4)
Kappa, lambda light chain ratio: 1.61 (ref 0.26–1.65)
Lambda free light chains: 22.2 mg/L (ref 5.7–26.3)

## 2021-10-10 LAB — IRON AND IRON BINDING CAPACITY (CC-WL,HP ONLY)
Iron: 178 ug/dL (ref 45–182)
Saturation Ratios: 47 % — ABNORMAL HIGH (ref 17.9–39.5)
TIBC: 381 ug/dL (ref 250–450)
UIBC: 203 ug/dL (ref 117–376)

## 2021-10-10 LAB — IGG, IGA, IGM
IgA: 423 mg/dL (ref 61–437)
IgG (Immunoglobin G), Serum: 1164 mg/dL (ref 603–1613)
IgM (Immunoglobulin M), Srm: 31 mg/dL (ref 20–172)

## 2021-10-10 LAB — FERRITIN: Ferritin: 150 ng/mL (ref 24–336)

## 2021-10-11 ENCOUNTER — Telehealth: Payer: Self-pay | Admitting: Medical

## 2021-10-11 LAB — PROTEIN ELECTROPHORESIS, SERUM, WITH REFLEX
A/G Ratio: 1.3 (ref 0.7–1.7)
Albumin ELP: 3.9 g/dL (ref 2.9–4.4)
Alpha-1-Globulin: 0.2 g/dL (ref 0.0–0.4)
Alpha-2-Globulin: 0.5 g/dL (ref 0.4–1.0)
Beta Globulin: 1 g/dL (ref 0.7–1.3)
Gamma Globulin: 1.2 g/dL (ref 0.4–1.8)
Globulin, Total: 3 g/dL (ref 2.2–3.9)
Total Protein ELP: 6.9 g/dL (ref 6.0–8.5)

## 2021-10-11 NOTE — Telephone Encounter (Signed)
Pt has two different upcoming dental procedures in May.  ? ?Pt needs 4 tablets of amoxicillin prescribed 2 per each visit.  ? ?Please advise.  ?

## 2021-10-12 ENCOUNTER — Ambulatory Visit (HOSPITAL_BASED_OUTPATIENT_CLINIC_OR_DEPARTMENT_OTHER)
Admission: RE | Admit: 2021-10-12 | Discharge: 2021-10-12 | Disposition: A | Discharge status: Home / Self Care | Payer: Medicare Other | Source: Ambulatory Visit | Attending: Hematology & Oncology | Admitting: Hematology & Oncology

## 2021-10-12 DIAGNOSIS — C9000 Multiple myeloma not having achieved remission: Secondary | ICD-10-CM | POA: Diagnosis present

## 2021-10-12 MED ORDER — GADOBUTROL 1 MMOL/ML IV SOLN
8.5000 mL | Freq: Once | INTRAVENOUS | Status: AC | PRN
  Administered 2021-10-12: 8.5 mL via INTRAVENOUS

## 2021-10-14 MED ORDER — AMOXICILLIN 500 MG PO CAPS: ORAL_CAPSULE | ORAL | 1 refills | Status: DC

## 2021-10-14 NOTE — Telephone Encounter (Addendum)
Patient needs appointment for either cpe or follow up and then we can send in refill. ? ?Left message on machine to call back to schedule. ?

## 2021-10-14 NOTE — Telephone Encounter (Signed)
Pt Called and was advised of the Rx  ?

## 2021-10-15 NOTE — Telephone Encounter (Signed)
Called Pt got him scheduled  ?

## 2021-10-24 ENCOUNTER — Ambulatory Visit (INDEPENDENT_AMBULATORY_CARE_PROVIDER_SITE_OTHER): Payer: Medicare Other | Admitting: Medical

## 2021-10-24 VITALS — BP 120/80 | HR 60 | Temp 98.2°F | Resp 18 | Ht 67.0 in | Wt 185.2 lb

## 2021-10-24 DIAGNOSIS — E785 Hyperlipidemia, unspecified: Secondary | ICD-10-CM

## 2021-10-24 DIAGNOSIS — G8929 Other chronic pain: Secondary | ICD-10-CM

## 2021-10-24 DIAGNOSIS — R350 Frequency of micturition: Secondary | ICD-10-CM | POA: Diagnosis not present

## 2021-10-24 DIAGNOSIS — G4701 Insomnia due to medical condition: Secondary | ICD-10-CM

## 2021-10-24 DIAGNOSIS — I10 Essential (primary) hypertension: Secondary | ICD-10-CM | POA: Diagnosis not present

## 2021-10-24 LAB — PSA: PSA: 0.24 ng/mL (ref 0.10–4.00)

## 2021-10-24 NOTE — Patient Instructions (Addendum)
Htn- bp well controlled. Valartan 160 mg daily and amlodopine 5 mg daily. ? ?Insomnia- on ambien. Takes 1/2-1 tab weekly recently. Continue to use sparingly. ? ?Mild frequent urination so will get psa and compare to prior level 3 years ago. ? ?Recent lipid panel all good with only mild ldl elevation in past. Cmp also normal recenlty. ? ?Follow up one year or sooner if needed. ? ? ?

## 2021-10-24 NOTE — Progress Notes (Signed)
? ?Subjective:  ? ? Patient ID: Jorge [name], male    DOB: [birth year], 67 y.o.   MRN: [id number] ? ?HPI ? ?Pt in for follow up.  ? ? ?Hx of high cholesterol. Mild ldl elevation 103 three years ago. Dad had high cholesterol. Dad may have had ministrokes at 65 yrs of age. ? ?09-06-21 lipid panel markers all good. Done at [facility]. ?08-08-21 cmp was normal.  ? ?Low potassium in past. But normal at [facility]. ? ?Mild frequent urination at night. Only urinates once at night. Early am.  ? ?Pt has upcoming dental appointment coming up. Rx amoxicillin prior for procedure ? ?Htn- bp controlled. Valartan 160 mg daily and amlodopine 5 mg daily. ? ?Insomnia- pt on ambien. Takes 1/2-1 tab weekly recently.  ? ? ? ?Review of Systems  ?Constitutional:  Negative for chills, fatigue and fever.  ?HENT:  Negative for congestion and drooling.   ?Respiratory:  Negative for cough, chest tightness, shortness of breath and wheezing.   ?Cardiovascular:  Negative for chest pain and palpitations.  ?Gastrointestinal:  Negative for abdominal pain, constipation and nausea.  ?Musculoskeletal:  Negative for back pain and myalgias.  ?Skin:  Negative for rash.  ?Neurological:  Negative for dizziness, light-headedness and headaches.  ?Hematological:  Negative for adenopathy. Does not bruise/bleed easily.  ?Psychiatric/Behavioral:  Negative for behavioral problems.   ? ?Past Medical History:  ?Diagnosis Date  ? Allergy   ? Anxiety   ? Asthma   ? Blood transfusion without reported diagnosis 2018  ? Bone metastasis ([place])   ? Chest cold 05/19/2016  ? productive cough  -- started on antibiotic  ? Chronic back pain   ? due to bone mets from myeloma  ? Cough   ? Depression   ? Diverticulitis   ? GERD (gastroesophageal reflux disease)   ? Hiatal hernia   ? History of chicken pox   ? History of concussion   ? age 51 -- no residual  ? History of DVT of lower extremity 03/21/2016  treated and completed w/ xarelto  ? per doppler left extensive occlusion common femoral,  femoral, and popliteal veins and right partial occlusion common femoral and profunda femoral veins/  last doppler 06-04-2016 no evidence acute or chronic dvt noted either leg   ? History of radiation therapy 06/09/16-06/23/16  ? lower thoracic spine 25 Gy in 10 fractions  ? Mouth ulcers   ? secondary to radiation  ? Multiple myeloma ([place]) dx 02/22/2016 via bone marrow bx---  oncologist-  [name]  ? IgG Kappa-- Hyperdiploid/ +11 w/ bone mets--  current treatment chemotherapy (started 08/ 2017)and pallitive radiation to back started 06-09-2016  ? Renal calculus, right   ? Wears contact lenses   ? ?  ?Social History  ? ?Socioeconomic History  ? Marital status: Married  ?  Spouse name: Not on file  ? Number of children: 3  ? Years of education: Not on file  ? Highest education level: Not on file  ?Occupational History  ? Not on file  ?Tobacco Use  ? Smoking status: Former  ?  Packs/day: 1.00  ?  Years: 7.00  ?  Pack years: 7.00  ?  Types: Cigarettes  ?  Quit date: 11/02/1980  ?  Years since quitting: 41.0  ? Smokeless tobacco: Never  ?Vaping Use  ? Vaping Use: Never used  ?Substance and Sexual Activity  ? Alcohol use: Yes  ?  Comment: occasional  ? Drug use: No  ? Sexual activity:  Never  ?Other Topics Concern  ? Not on file  ?Social History Narrative  ? Not on file  ? ?Social Determinants of Health  ? ?Financial Resource Strain: Low Risk   ? Difficulty of Paying Living Expenses: Not hard at all  ?Food Insecurity: No Food Insecurity  ? Worried About Charity fundraiser in the Last Year: Never true  ? Ran Out of Food in the Last Year: Never true  ?Transportation Needs: No Transportation Needs  ? Lack of Transportation (Medical): No  ? Lack of Transportation (Non-Medical): No  ?Physical Activity: Sufficiently Active  ? Days of Exercise per Week: 7 days  ? Minutes of Exercise per Session: 60 min  ?Stress: No Stress Concern Present  ? Feeling of Stress : Not at all  ?Social Connections: Moderately Integrated  ? Frequency of  Communication with Friends and Family: More than three times a week  ? Frequency of Social Gatherings with Friends and Family: More than three times a week  ? Attends Religious Services: More than 4 times per year  ? Active Member of Clubs or Organizations: No  ? Attends Archivist Meetings: Never  ? Marital Status: Married  ?Intimate Partner Violence: Not At Risk  ? Fear of Current or Ex-Partner: No  ? Emotionally Abused: No  ? Physically Abused: No  ? Sexually Abused: No  ? ? ?Past Surgical History:  ?Procedure Laterality Date  ? COLONOSCOPY  0122,2411  ? COLONOSCOPY  01/07/2017  ? Dr Penelope Coop, [facility]  ? CYSTOSCOPY W/ URETERAL STENT PLACEMENT Right 06/20/2016  ? Procedure: CYSTOSCOPY WITH STENT REPLACEMENT;  Surgeon: Kathie Rhodes, MD;  Location: [facility];  Service: Urology;  Laterality: Right;  ? CYSTOSCOPY WITH RETROGRADE PYELOGRAM, URETEROSCOPY AND STENT PLACEMENT Right 05/30/2016  ? Procedure: CYSTOSCOPY WITH RETROGRADE PYELOGRAM, URETEROSCOPY AND STENT PLACEMENT,DILITATION URETERAL STRICTURE;  Surgeon: Kathie Rhodes, MD;  Location: [facility];  Service: Urology;  Laterality: Right;  ? CYSTOSCOPY/RETROGRADE/URETEROSCOPY/STONE EXTRACTION WITH BASKET Right 06/20/2016  ? Procedure: CYSTOSCOPY/URETEROSCOPY/STONE EXTRACTION WITH BASKET;  Surgeon: Kathie Rhodes, MD;  Location: [facility];  Service: Urology;  Laterality: Right;  ? HIATAL HERNIA REPAIR N/A 08/13/2018  ? Procedure: LAPAROSCOPIC REPAIR OF HIATAL HERNIA REPAIR WITH FUNDOPLICATION AND INSERTION OF MESH;  Surgeon: Excell Seltzer, MD;  Location: [facility];  Service: General;  Laterality: N/A;  ? HOLMIUM LASER APPLICATION Right 46/10/3140  ? Procedure: HOLMIUM LASER APPLICATION;  Surgeon: Kathie Rhodes, MD;  Location: [facility];  Service: Urology;  Laterality: Right;  ? IR GENERIC HISTORICAL  02/11/2016  ? IR RADIOLOGIST EVAL & MGMT 02/11/2016 [facility]  ? IR GENERIC HISTORICAL  02/15/2016  ? IR BONE  TUMOR(S)RF ABLATION 02/15/2016 Luanne Bras, MD [facility]  ? IR GENERIC HISTORICAL  02/15/2016  ? IR BONE TUMOR(S)RF ABLATION 02/15/2016 Luanne Bras, MD [facility]  ? IR GENERIC HISTORICAL  02/15/2016  ? IR BONE TUMOR(S)RF ABLATION 02/15/2016 Luanne Bras, MD [facility]  ? IR GENERIC HISTORICAL  02/15/2016  ? IR KYPHO THORACIC WITH BONE BIOPSY 02/15/2016 Luanne Bras, MD [facility]  ? IR GENERIC HISTORICAL  02/15/2016  ? IR KYPHO THORACIC WITH BONE BIOPSY 02/15/2016 Luanne Bras, MD [facility]  ? IR GENERIC HISTORICAL  02/15/2016  ? IR VERTEBROPLASTY CERV/THOR BX INC UNI/BIL INC/INJECT/IMAGING 02/15/2016 Luanne Bras, MD [facility]  ? IR GENERIC HISTORICAL  03/13/2016  ? IR KYPHO EA ADDL LEVEL THORACIC OR LUMBAR 03/13/2016 Luanne Bras, MD [facility]  ? IR GENERIC HISTORICAL  03/13/2016  ? IR KYPHO EA ADDL LEVEL THORACIC OR LUMBAR 03/13/2016 Luanne Bras, MD [facility]  ? IR GENERIC HISTORICAL  03/13/2016  ? IR BONE TUMOR(S)RF ABLATION 03/13/2016 Luanne Bras, MD [facility]  ? IR GENERIC HISTORICAL  03/13/2016  ? IR KYPHO LUMBAR INC FX REDUCE BONE BX UNI/BIL CANNULATION INC/IMAGING 03/13/2016 Luanne Bras, MD [facility]  ? IR GENERIC HISTORICAL  03/13/2016  ? IR BONE TUMOR(S)RF ABLATION 03/13/2016 Luanne Bras, MD [facility]  ? IR GENERIC HISTORICAL  03/13/2016  ? IR BONE TUMOR(S)RF ABLATION 03/13/2016 Luanne Bras, MD [facility]  ? IR GENERIC HISTORICAL  03/31/2016  ? IR RADIOLOGIST EVAL & MGMT 03/31/2016 [facility]  ? KYPHOPLASTY    ? 02/2016, 03/2016, 11/2016  ? LAPAROSCOPIC INGUINAL HERNIA REPAIR Bilateral 12-16-2013  dr gross  ? RADIOLOGY WITH ANESTHESIA N/A 02/15/2016  ? Procedure: Spinal Ablation;  Surgeon: Luanne Bras, MD;  Location: [facility];  Service: Radiology;  Laterality: N/A;  ? RADIOLOGY WITH ANESTHESIA N/A 03/13/2016  ? Procedure: LUMBER ABLATION;  Surgeon: Luanne Bras, MD;  Location: [facility];  Service: Radiology;  Laterality: N/A;  ?  ROTATOR CUFF REPAIR Right 2003  ? spine operation    ? x1 in 2018 x3 in 2017  ? TONSILLECTOMY  age 36  ? VERTEBROPLASTY  01/2016  ? WISDOM TOOTH EXTRACTION    ? ? ?Family History  ?Problem Relation Age of Onset  ?

## 2021-10-25 ENCOUNTER — Other Ambulatory Visit: Payer: Self-pay | Admitting: *Deleted

## 2021-10-25 DIAGNOSIS — C9000 Multiple myeloma not having achieved remission: Secondary | ICD-10-CM

## 2021-10-25 MED ORDER — LENALIDOMIDE 5 MG PO CAPS: ORAL_CAPSULE | ORAL | 0 refills | Status: DC

## 2021-11-25 ENCOUNTER — Telehealth: Payer: Self-pay | Admitting: *Deleted

## 2021-11-25 NOTE — Telephone Encounter (Signed)
Returned call to pt regarding upcoming appt 5/26 and possible Zometa infusion, ?Message left for; Reviewed your concern with Dr. Marin Olp. He does not recall a conversation about him calling a Dentist. Dr. Marin Olp asked if you have a Dentist to call their office for clearance , fax this to [place] in writing; to receive Zometa. If you do not have a current Dentist we may be able to refer you to a new one.  ?Zometa was placed on hold in March due to  dental caries.  ?Request pt to call office with additional information as this is needed prior to next Zometa infusion. ?

## 2021-11-25 NOTE — Progress Notes (Signed)
? ?Follow Up Note ? ?RE: Jorge [name] MRN: [id number] DOB: 12/20/1955 ?Date of Office Visit: 11/26/2021 ? ?Referring provider: Mackie Pai, PA-C ?Primary care provider: Mackie Pai, PA-C ? ?Chief Complaint: Allergic Rhinitis  (Seen an ENT and put him on gabapentin and has been rinsing his nose out. Has noticed improvement since his last visit. ) ? ?History of Present Illness: ?I had the pleasure of seeing Jorge [name] for a follow up visit at the [facility] on 11/26/2021. He is a 66 y.o. male, who is being followed for allergic rhinitis, chronic cough and frequent infections. His previous allergy office visit was on 05/28/2021 with Dr. Maudie Mercury. Today is a regular follow up visit. ? ?Perennial allergic rhinitis ?Currently taking Atrovent 2 sprays per nostril BID, zyrtec 44m daily, saline rinses with good benefit. ?Patient is content with this regimen.  ?Only using Flonase prn.  ?  ?Chronic cough ?Went to see ENT and started on gabapentin which has been helping. This is also helping his neuropathy. Coughing is about 70-80% less than before. ? ?No inhaler use at all.  ?Seeing pulmonologist about once per year. ? ?Currently in remission for multiple myeloma - follows with hematology every 3 months.  ?  ?Frequent episodes of pneumonia ?No infections/antibiotics since the last visit.  ? ?Getting treatment for varicose veins. ? ?Assessment and Plan: ?Jorge [name] a 66y.o. male with: ?Perennial allergic rhinitis ?Past history - Perennial rhinitis symptoms for the last 5 years with worsening coughing in the mornings.  Tried Flonase and Astelin with good benefit.  No benefit with antihistamines.  2022 skin testing showed: Positive to perennial mold #4 and dust mites. Singulair caused mood changes. ?Interim history - much improved. ENT eval done (septal deviation, turbinate hypertrophy), on gabapentin for cough. ?Use Atrovent (ipratropium) 0.03% 1-2 sprays per nostril twice a day as needed for runny  nose/drainage. ?Use Flonase (fluticasone) nasal spray 1 spray per nostril twice a day as needed for nasal congestion.  ?Nasal saline spray (i.e., Simply Saline) or nasal saline lavage (i.e., NeilMed) is recommended as needed and prior to medicated nasal sprays. ?Continue environmental control measures. ?Use over the counter antihistamines such as Zyrtec (cetirizine), Claritin (loratadine), Allegra (fexofenadine), or Xyzal (levocetirizine) daily as needed. May take twice a day during allergy flares. May switch antihistamines every few months. ? ?Chronic cough ?Past history - Coughing, wheezing, shortness of breath and chest tightness for 5 years.  Tried albuterol and Symbicort as needed with some benefit.  COVID-19 infection 2 months ago.  Being treated for multiple myeloma for the last 5 years.  Follows with pulmonology.  Tried PPI with no benefit. 2022 PFT showed restriction, 2022 CT chest showed atelectasis, hiatal hernia, lytic rib lesions. ?Interim history - coughing 70-80% improved with gabapentin. No inhaler use.  ?Follow with pulmonology/ENT. ?Continue gabapentin as per ENT. ?Daily controller medication(s): none. ?During upper respiratory infections/asthma flares: start Symbicort 1626m 2 puffs twice a day and rinse mouth after each use for 1-2 weeks until your breathing symptoms return to baseline.  ?May use albuterol rescue inhaler 2 puffs every 4 to 6 hours as needed for shortness of breath, chest tightness, coughing, and wheezing. May use albuterol rescue inhaler 2 puffs 5 to 15 minutes prior to strenuous physical activities. Monitor frequency of use.  ? ?Frequent episodes of pneumonia ?Past history - Frequent bouts of pneumonia requiring hospitalization and ? intubation in the past. Medical history significant for multiple myeloma which is in remission.  ?Interim history -  no infections since last visit.  ?Keep track of infections and antibiotics use. ?Consider getting basic immune evaluation bloodwork  if getting frequent infections in future.  ? ?Return in about 6 months (around 05/29/2022). ? ?No orders of the defined types were placed in this encounter. ? ?Lab Orders  ?No laboratory test(s) ordered today  ? ? ?Diagnostics: ?None.  ? ? ?Medication List:  ?Current Outpatient Medications  ?Medication Sig Dispense Refill  ? acetaminophen (TYLENOL) 325 MG tablet Take 2 tablets (650 mg total) by mouth every 6 (six) hours as needed for mild pain (or Fever >/= 101).    ? albuterol (VENTOLIN HFA) 108 (90 Base) MCG/ACT inhaler TAKE 2 PUFFS BY MOUTH EVERY 6 HOURS AS NEEDED FOR WHEEZE OR SHORTNESS OF BREATH 6.7 each 1  ? cetirizine (ZYRTEC) 10 MG tablet Take 10 mg by mouth daily as needed for allergies.    ? Cholecalciferol (VITAMIN D3) 250 MCG (10000 UT) capsule Take 10,000 Units by mouth daily.    ? fluorouracil (EFUDEX) 5 % cream     ? fluticasone (FLONASE) 50 MCG/ACT nasal spray SPRAY 2 SPRAYS INTO EACH NOSTRIL EVERY DAY 16 mL 1  ? gabapentin (NEURONTIN) 300 MG capsule Take 300 mg by mouth at bedtime.    ? guaiFENesin-codeine 100-10 MG/5ML syrup Take 10 mLs by mouth every 6 (six) hours as needed for cough. 240 mL 0  ? ipratropium (ATROVENT) 0.03 % nasal spray Place 1-2 sprays into both nostrils 2 (two) times daily as needed (nasal drainage). 30 mL 5  ? lenalidomide (REVLIMID) 5 MG capsule TAKE 1 CAPSULE BY MOUTH DAILY  FOR 21 DAYS, THEN 7 DAYS OFF. Auth #[id number] 21 capsule 0  ? Loperamide HCl (IMODIUM PO) Take by mouth daily.    ? valsartan (DIOVAN) 160 MG tablet TAKE 1 TABLET BY MOUTH EVERY DAY 30 tablet 3  ? XARELTO 10 MG TABS tablet TAKE 1 TABLET BY MOUTH EVERY DAY 90 tablet 3  ? zolpidem (AMBIEN) 5 MG tablet Take 1 tablet (5 mg total) by mouth at bedtime as needed for sleep. 30 tablet 1  ? amLODipine (NORVASC) 5 MG tablet Take 1 tablet (5 mg total) by mouth daily. 90 tablet 3  ? lidocaine (LIDODERM) 5 % Place 1 patch onto the skin daily as needed (pain). (Patient not taking: Reported on 11/26/2021)  2  ? SYMBICORT  160-4.5 MCG/ACT inhaler TAKE 2 PUFFS BY MOUTH TWICE A DAY (Patient not taking: Reported on 11/26/2021) 10.2 each 0  ? ?Current Facility-Administered Medications  ?Medication Dose Route Frequency Provider Last Rate Last Admin  ? 0.9 %  sodium chloride infusion   Intravenous PRN Saguier, Percell Miller, PA-C      ? ?Facility-Administered Medications Ordered in Other Visits  ?Medication Dose Route Frequency Provider Last Rate Last Admin  ? heparin lock flush 100 unit/mL  500 Units Intravenous Once Celso Amy, NP      ? sodium chloride flush (NS) 0.9 % injection 3 mL  3 mL Intravenous Once PRN Celso Amy, NP      ? ?Allergies: ?Allergies  ?Allergen Reactions  ? Singulair [Montelukast] Other (See Comments)  ?  Mood change  ? ?I reviewed his past medical history, social history, family history, and environmental history and no significant changes have been reported from his previous visit. ? ?Review of Systems  ?Constitutional:  Negative for appetite change, chills, fever and unexpected weight change.  ?HENT:  Positive for congestion, postnasal drip and rhinorrhea.   ?Eyes:  Negative  for itching.  ?Respiratory:  Positive for cough. Negative for chest tightness, shortness of breath and wheezing.   ?Cardiovascular:  Negative for chest pain.  ?Gastrointestinal:  Negative for abdominal pain.  ?Genitourinary:  Negative for difficulty urinating.  ?Skin:  Negative for rash.  ?Allergic/Immunologic: Positive for environmental allergies. Negative for food allergies.  ?Neurological:  Negative for headaches.  ? ?Objective: ?BP 118/72   Pulse 61   Temp 97.8 ?F (36.6 ?C)   Resp 16   Ht '5\' 7"'  (1.702 m)   Wt 182 lb 2 oz (82.6 kg)   SpO2 97%   BMI 28.52 kg/m?  ?Body mass index is 28.52 kg/m?[name] ?Physical Exam ?Vitals and nursing note reviewed.  ?Constitutional:   ?   Appearance: Normal appearance. He is well-developed.  ?HENT:  ?   Head: Normocephalic and atraumatic.  ?   Right Ear: Tympanic membrane and external ear normal.  ?    Left Ear: Tympanic membrane and external ear normal.  ?   Nose: Nose normal.  ?   Mouth/Throat:  ?   Mouth: Mucous membranes are moist.  ?   Pharynx: Oropharynx is clear.  ?Eyes:  ?   Conjunctiva/sclera: Conjun

## 2021-11-26 ENCOUNTER — Ambulatory Visit (INDEPENDENT_AMBULATORY_CARE_PROVIDER_SITE_OTHER): Payer: Medicare Other | Admitting: Allergy

## 2021-11-26 ENCOUNTER — Other Ambulatory Visit: Payer: Self-pay

## 2021-11-26 ENCOUNTER — Encounter: Payer: Self-pay | Admitting: Allergy

## 2021-11-26 VITALS — BP 118/72 | HR 61 | Temp 97.8°F | Resp 16 | Ht 67.0 in | Wt 182.1 lb

## 2021-11-26 DIAGNOSIS — J189 Pneumonia, unspecified organism: Secondary | ICD-10-CM

## 2021-11-26 DIAGNOSIS — J3089 Other allergic rhinitis: Secondary | ICD-10-CM

## 2021-11-26 DIAGNOSIS — R053 Chronic cough: Secondary | ICD-10-CM | POA: Diagnosis not present

## 2021-11-26 NOTE — Assessment & Plan Note (Signed)
>>  ASSESSMENT AND PLAN FOR FREQUENT EPISODES OF PNEUMONIA WRITTEN ON 11/26/2021 11:50 AM BY Garnet Sierras, DO  Past history - Frequent bouts of pneumonia requiring hospitalization and ? intubation in the past. Medical history significant for multiple myeloma which is in remission.  Interim history - no infections since last visit.  [name] Keep track of infections and antibiotics use. . Consider getting basic immune evaluation bloodwork if getting frequent infections in future.

## 2021-11-26 NOTE — Assessment & Plan Note (Addendum)
Past history - Perennial rhinitis symptoms for the last 5 years with worsening coughing in the mornings.  Tried Flonase and Astelin with good benefit.  No benefit with antihistamines.  2022 skin testing showed: Positive to perennial mold #4 and dust mites. Singulair caused mood changes. ?Interim history - much improved. ENT eval done (septal deviation, turbinate hypertrophy), on gabapentin for cough. ?? Use Atrovent (ipratropium) 0.03% 1-2 sprays per nostril twice a day as needed for runny nose/drainage. ?? Use Flonase (fluticasone) nasal spray 1 spray per nostril twice a day as needed for nasal congestion.  ?? Nasal saline spray (i.e., Simply Saline) or nasal saline lavage (i.e., NeilMed) is recommended as needed and prior to medicated nasal sprays. ?? Continue environmental control measures. ?? Use over the counter antihistamines such as Zyrtec (cetirizine), Claritin (loratadine), Allegra (fexofenadine), or Xyzal (levocetirizine) daily as needed. May take twice a day during allergy flares. May switch antihistamines every few months. ?

## 2021-11-26 NOTE — Assessment & Plan Note (Signed)
Past history - Coughing, wheezing, shortness of breath and chest tightness for 5 years.  Tried albuterol and Symbicort as needed with some benefit.  COVID-19 infection 2 months ago.  Being treated for multiple myeloma for the last 5 years.  Follows with pulmonology.  Tried PPI with no benefit. 2022 PFT showed restriction, 2022 CT chest showed atelectasis, hiatal hernia, lytic rib lesions. ?Interim history - coughing 70-80% improved with gabapentin. No inhaler use.  ?? Follow with pulmonology/ENT. ?o Continue gabapentin as per ENT. ?? Daily controller medication(s): none. ?? During upper respiratory infections/asthma flares: start Symbicort 162mg 2 puffs twice a day and rinse mouth after each use for 1-2 weeks until your breathing symptoms return to baseline.  ?? May use albuterol rescue inhaler 2 puffs every 4 to 6 hours as needed for shortness of breath, chest tightness, coughing, and wheezing. May use albuterol rescue inhaler 2 puffs 5 to 15 minutes prior to strenuous physical activities. Monitor frequency of use.  ?

## 2021-11-26 NOTE — Patient Instructions (Addendum)
Rhinitis: ?2022 skin testing showed: Positive to perennial mold #4 and dust mites.  ?Use Atrovent (ipratropium) 0.03% 1-2 sprays per nostril twice a day as needed for runny nose/drainage. ?Use Flonase (fluticasone) nasal spray 1 spray per nostril twice a day as needed for nasal congestion.  ?Nasal saline spray (i.e., Simply Saline) or nasal saline lavage (i.e., NeilMed) is recommended as needed and prior to medicated nasal sprays. ?Continue environmental control measures. ?Use over the counter antihistamines such as Zyrtec (cetirizine), Claritin (loratadine), Allegra (fexofenadine), or Xyzal (levocetirizine) daily as needed. May take twice a day during allergy flares. May switch antihistamines every few months. ? ?Coughing:  ?Follows with pulmonology/ENT. ?Continue gabapentin as per ENT. ?Daily controller medication(s): none. ?During upper respiratory infections/asthma flares: start Symbicort 1100mg 2 puffs twice a day and rinse mouth after each use for 1-2 weeks until your breathing symptoms return to baseline.  ?May use albuterol rescue inhaler 2 puffs every 4 to 6 hours as needed for shortness of breath, chest tightness, coughing, and wheezing. May use albuterol rescue inhaler 2 puffs 5 to 15 minutes prior to strenuous physical activities. Monitor frequency of use.  ?Breathing control goals:  ?Full participation in all desired activities (may need albuterol before activity) ?Albuterol use two times or less a week on average (not counting use with activity) ?Cough interfering with sleep two times or less a month ?Oral steroids no more than once a year ?No hospitalizations  ? ?Infections: ?Keep track of infections and antibiotics use. ? ?Follow up in 6 months or sooner if needed.   ? ?Control of House Dust Mite Allergen ?Dust mite allergens are a common trigger of allergy and asthma symptoms. While they can be found throughout the house, these microscopic creatures thrive in warm, humid environments such as  bedding, upholstered furniture and carpeting. ?Because so much time is spent in the bedroom, it is essential to reduce mite levels there.  ?Encase pillows, mattresses, and box springs in special allergen-proof fabric covers or airtight, zippered plastic covers.  ?Bedding should be washed weekly in hot water (130? F) and dried in a hot dryer. Allergen-proof covers are available for comforters and pillows that can?t be regularly washed.  ?Wash the allergy-proof covers every few months. Minimize clutter in the bedroom. Keep pets out of the bedroom.  ?Keep humidity less than 50% by using a dehumidifier or air conditioning. You can buy a humidity measuring device called a hygrometer to monitor this.  ?If possible, replace carpets with hardwood, linoleum, or washable area rugs. If that's not possible, vacuum frequently with a vacuum that has a HEPA filter. ?Remove all upholstered furniture and non-washable window drapes from the bedroom. ?Remove all non-washable stuffed toys from the bedroom.  Wash stuffed toys weekly. ? ?Mold Control ?Mold and fungi can grow on a variety of surfaces provided certain temperature and moisture conditions exist.  ?Outdoor molds grow on plants, decaying vegetation and soil. The major outdoor mold, Alternaria and Cladosporium, are found in very high numbers during hot and dry conditions. Generally, a late summer - fall peak is seen for common outdoor fungal spores. Rain will temporarily lower outdoor mold spore count, but counts rise rapidly when the rainy period ends. ?The most important indoor molds are Aspergillus and Penicillium. Dark, humid and poorly ventilated basements are ideal sites for mold growth. The next most common sites of mold growth are the bathroom and the kitchen. ?Outdoor (Seasonal) Mold Control ?Use air conditioning and keep windows closed. ?Avoid exposure to decaying vegetation. ?  Avoid leaf raking. ?Avoid grain handling. ?Consider wearing a face mask if working in moldy  areas.  ?Indoor (Perennial) Mold Control  ?Maintain humidity below 50%. ?Get rid of mold growth on hard surfaces with water, detergent and, if necessary, 5% bleach (do not mix with other cleaners). Then dry the area completely. If mold covers an area more than 10 square feet, consider hiring an indoor environmental professional. ?For clothing, washing with soap and water is best. If moldy items cannot be cleaned and dried, throw them away. ?Remove sources e.g. contaminated carpets. ?Repair and seal leaking roofs or pipes. Using dehumidifiers in damp basements may be helpful, but empty the water and clean units regularly to prevent mildew from forming. All rooms, especially basements, bathrooms and kitchens, require ventilation and cleaning to deter mold and mildew growth. Avoid carpeting on concrete or damp floors, and storing items in damp areas. ?

## 2021-11-26 NOTE — Assessment & Plan Note (Signed)
Past history - Frequent bouts of pneumonia requiring hospitalization and ? intubation in the past. Medical history significant for multiple myeloma which is in remission.  ?Interim history - no infections since last visit.  ?? Keep track of infections and antibiotics use. ?? Consider getting basic immune evaluation bloodwork if getting frequent infections in future.  ?

## 2021-11-29 ENCOUNTER — Other Ambulatory Visit: Payer: Self-pay | Admitting: *Deleted

## 2021-11-29 ENCOUNTER — Other Ambulatory Visit: Payer: Self-pay | Admitting: Hematology & Oncology

## 2021-11-29 DIAGNOSIS — C9000 Multiple myeloma not having achieved remission: Secondary | ICD-10-CM

## 2021-11-29 MED ORDER — LENALIDOMIDE 5 MG PO CAPS: ORAL_CAPSULE | ORAL | 0 refills | Status: DC

## 2021-12-06 ENCOUNTER — Encounter: Payer: Self-pay | Admitting: Hematology & Oncology

## 2021-12-06 ENCOUNTER — Inpatient Hospital Stay: Payer: Medicare Other

## 2021-12-06 ENCOUNTER — Other Ambulatory Visit: Payer: Self-pay

## 2021-12-06 ENCOUNTER — Inpatient Hospital Stay: Payer: Medicare Other | Attending: Hematology & Oncology

## 2021-12-06 ENCOUNTER — Inpatient Hospital Stay (HOSPITAL_BASED_OUTPATIENT_CLINIC_OR_DEPARTMENT_OTHER): Payer: Medicare Other | Admitting: Hematology & Oncology

## 2021-12-06 VITALS — BP 107/70 | HR 61 | Temp 98.1°F | Resp 20 | Wt 182.8 lb

## 2021-12-06 DIAGNOSIS — C9000 Multiple myeloma not having achieved remission: Secondary | ICD-10-CM

## 2021-12-06 DIAGNOSIS — D509 Iron deficiency anemia, unspecified: Secondary | ICD-10-CM | POA: Diagnosis not present

## 2021-12-06 DIAGNOSIS — Z7901 Long term (current) use of anticoagulants: Secondary | ICD-10-CM | POA: Insufficient documentation

## 2021-12-06 LAB — CMP (CANCER CENTER ONLY)
ALT: 33 U/L (ref 0–44)
AST: 33 U/L (ref 15–41)
Albumin: 4.3 g/dL (ref 3.5–5.0)
Alkaline Phosphatase: 48 U/L (ref 38–126)
Anion gap: 7 (ref 5–15)
BUN: 21 mg/dL (ref 8–23)
CO2: 28 mmol/L (ref 22–32)
Calcium: 9.8 mg/dL (ref 8.9–10.3)
Chloride: 105 mmol/L (ref 98–111)
Creatinine: 1.1 mg/dL (ref 0.61–1.24)
GFR, Estimated: >60 mL/min (ref >60)
Glucose, Bld: 87 mg/dL (ref 70–99)
Potassium: 3.9 mmol/L (ref 3.5–5.1)
Sodium: 140 mmol/L (ref 135–145)
Total Bilirubin: 0.9 mg/dL (ref 0.3–1.2)
Total Protein: 7 g/dL (ref 6.5–8.1)

## 2021-12-06 LAB — CBC WITH DIFFERENTIAL (CANCER CENTER ONLY)
Abs Immature Granulocytes: 0.01 10*3/uL (ref 0.00–0.07)
Basophils Absolute: 0.1 10*3/uL (ref 0.0–0.1)
Basophils Relative: 1 %
Eosinophils Absolute: 0.2 10*3/uL (ref 0.0–0.5)
Eosinophils Relative: 4 %
HCT: 42.3 % (ref 39.0–52.0)
Hemoglobin: 14.8 g/dL (ref 13.0–17.0)
Immature Granulocytes: 0 %
Lymphocytes Relative: 48 %
Lymphs Abs: 1.9 10*3/uL (ref 0.7–4.0)
MCH: 35.1 pg — ABNORMAL HIGH (ref 26.0–34.0)
MCHC: 35 g/dL (ref 30.0–36.0)
MCV: 100.2 fL — ABNORMAL HIGH (ref 80.0–100.0)
Monocytes Absolute: 0.4 10*3/uL (ref 0.1–1.0)
Monocytes Relative: 11 %
Neutro Abs: 1.4 10*3/uL — ABNORMAL LOW (ref 1.7–7.7)
Neutrophils Relative %: 36 %
Platelet Count: 109 10*3/uL — ABNORMAL LOW (ref 150–400)
RBC: 4.22 MIL/uL (ref 4.22–5.81)
RDW: 13.9 % (ref 11.5–15.5)
WBC Count: 3.9 10*3/uL — ABNORMAL LOW (ref 4.0–10.5)
nRBC: 0 % (ref 0.0–0.2)

## 2021-12-06 LAB — LACTATE DEHYDROGENASE: LDH: 169 U/L (ref 98–192)

## 2021-12-06 LAB — FERRITIN: Ferritin: 85 ng/mL (ref 24–336)

## 2021-12-06 MED ORDER — COLESEVELAM HCL 625 MG PO TABS: 625.0000 mg | ORAL_TABLET | Freq: Two times a day (BID) | ORAL | 5 refills | Status: DC

## 2021-12-06 MED ORDER — SODIUM CHLORIDE 0.9 % IV SOLN: INTRAVENOUS | Status: DC

## 2021-12-06 MED ORDER — ZOLEDRONIC ACID 4 MG/100ML IV SOLN
4.0000 mg | Freq: Once | INTRAVENOUS | Status: AC
  Administered 2021-12-06: 4 mg via INTRAVENOUS
  Filled 2021-12-06: qty 100

## 2021-12-06 NOTE — Patient Instructions (Signed)

## 2021-12-06 NOTE — Progress Notes (Signed)
Hematology and Oncology Follow Up Visit  Jorge [name] [id number] [birth year] 66 y.o. 12/06/2021   Principle Diagnosis:  IgG Kappa myeloma - Hyperdiploid/+11 DVT of the LEFT and RIGHT leg  Iron deficiency anemia  Current Therapy:   Revlimid 34m po q day (21 on/7 off) - start on 04/07/2018   Zometa 4 mg IV q 3 months - next dose is 02/2022 -- Xarelto 10 mg PO daily IV Iron as needed   Interim History:  Mr. PNovickis here today for follow-up.  He is doing quite well.  We did do an MRI of his back back in early April.  This did show that there is no active myeloma.  He had healed compression fractures.  We can go ahead with Zometa now.  We got a note from his dentist saying that Zometa would be okay.  He is getting varicose veins worked on with the right leg.  He seems to be pretty happy about this.  When we last saw him, his iron levels look pretty good.  His ferritin was 150 with an iron saturation of 47%.  He is doing some traveling.  He is getting some conference is ready.  [place]  He has had no problems with fever.  There is no cough or shortness of breath.    He is having some diarrhea.  This probably is from the Revlimid.  We will try him on some WelChol (625 mg p.o. twice daily) to see if this may help.   His last myeloma studies did not show monoclonal spike in his blood.  His IgG level was 1164 mg/dL.  The Kappa light chain was 3.6 mg/dL.  Overall, I would say his performance status is probably ECOG 0.   Medications:  Allergies as of 12/06/2021       Reactions   Singulair [montelukast] Other (See Comments)   Mood change        Medication List        Accurate as of Dec 06, 2021  2:04 PM. If you have any questions, ask your nurse or doctor.          STOP taking these medications    guaiFENesin-codeine 100-10 MG/5ML syrup Stopped by: [name]       TAKE these medications    acetaminophen 325  MG tablet Commonly known as: TYLENOL Take 2 tablets (650 mg total) by mouth every 6 (six) hours as needed for mild pain (or Fever >/= 101).   albuterol 108 (90 Base) MCG/ACT inhaler Commonly known as: VENTOLIN HFA TAKE 2 PUFFS BY MOUTH EVERY 6 HOURS AS NEEDED FOR WHEEZE OR SHORTNESS OF BREATH   amLODipine 5 MG tablet Commonly known as: NORVASC Take 1 tablet (5 mg total) by mouth daily.   cetirizine 10 MG tablet Commonly known as: ZYRTEC Take 10 mg by mouth daily as needed for allergies.   fluorouracil 5 % cream Commonly known as: EFUDEX 2 (two) times daily as needed.   fluticasone 50 MCG/ACT nasal spray Commonly known as: FLONASE SPRAY 2 SPRAYS INTO EACH NOSTRIL EVERY DAY   gabapentin 300 MG capsule Commonly known as: NEURONTIN Take 300 mg by mouth 2 (two) times daily.   IMODIUM PO Take by mouth daily.   ipratropium 0.03 % nasal spray Commonly known as: ATROVENT Place 1-2 sprays into both nostrils 2 (two) times daily as needed (nasal drainage).   lenalidomide 5 MG capsule Commonly known as: Revlimid  TAKE 1 CAPSULE BY MOUTH DAILY  FOR 21 DAYS, THEN 7 DAYS OFF.  Auth # [id number]   lidocaine 5 % Commonly known as: LIDODERM Place 1 patch onto the skin daily as needed (pain).   Symbicort 160-4.5 MCG/ACT inhaler Generic drug: budesonide-formoterol TAKE 2 PUFFS BY MOUTH TWICE A DAY   valsartan 160 MG tablet Commonly known as: DIOVAN TAKE 1 TABLET BY MOUTH EVERY DAY   Vitamin D3 250 MCG (10000 UT) capsule Take 10,000 Units by mouth daily.   Xarelto 10 MG Tabs tablet Generic drug: rivaroxaban TAKE 1 TABLET BY MOUTH EVERY DAY   zolpidem 5 MG tablet Commonly known as: AMBIEN Take 1 tablet (5 mg total) by mouth at bedtime as needed for sleep.        Allergies:  Allergies  Allergen Reactions   Singulair [Montelukast] Other (See Comments)    Mood change    Past Medical History, Surgical history, Social history, and Family History were reviewed and  updated.  Review of Systems: Review of Systems  Constitutional: Negative.   HENT: Negative.   Eyes: Negative.   Respiratory: Negative.   Cardiovascular: Negative.   Gastrointestinal: Negative.   Genitourinary: Negative.   Musculoskeletal: Positive for myalgias.  Skin: Negative.   Neurological: Negative.   Endo/Heme/Allergies: Negative.   Psychiatric/Behavioral: Negative.      Physical Exam:  weight is 182 lb 12.8 oz (82.9 kg). His oral temperature is 98.1 F (36.7 C). His blood pressure is 107/70 and his pulse is 61. His respiration is 20 and oxygen saturation is 98%.   Wt Readings from Last 3 Encounters:  12/06/21 182 lb 12.8 oz (82.9 kg)  11/26/21 182 lb 2 oz (82.6 kg)  10/24/21 185 lb 3.2 oz (84 kg)    Physical Exam Vitals signs reviewed.  HENT:     Head: Normocephalic and atraumatic.  Eyes:     Pupils: Pupils are equal, round, and reactive to light.  Neck:     Musculoskeletal: Normal range of motion.  Cardiovascular:     Rate and Rhythm: Normal rate and regular rhythm.     Heart sounds: Normal heart sounds.  Pulmonary:     Effort: Pulmonary effort is normal.     Breath sounds: Normal breath sounds.  Abdominal:     General: Bowel sounds are normal.     Palpations: Abdomen is soft.  Musculoskeletal: Normal range of motion.        General: No tenderness or deformity.  Lymphadenopathy:     Cervical: No cervical adenopathy.  Skin:    General: Skin is warm and dry.     Findings: No erythema or rash.  Neurological:     Mental Status: He is alert and oriented to person, place, and time.  Psychiatric:        Behavior: Behavior normal.        Thought Content: Thought content normal.        Judgment: Judgment normal.      Lab Results  Component Value Date   WBC 3.9 (L) 12/06/2021   HGB 14.8 12/06/2021   HCT 42.3 12/06/2021   MCV 100.2 (H) 12/06/2021   PLT 109 (L) 12/06/2021   Lab Results  Component Value Date   FERRITIN 150 10/09/2021   IRON 178  10/09/2021   TIBC 381 10/09/2021   UIBC 203 10/09/2021   IRONPCTSAT 47 (H) 10/09/2021   Lab Results  Component Value Date   RETICCTPCT 1.9 07/11/2021   RBC 4.22 12/06/2021  Lab Results  Component Value Date   KPAFRELGTCHN 35.7 (H) 10/09/2021   LAMBDASER 22.2 10/09/2021   KAPLAMBRATIO 1.61 10/09/2021   Lab Results  Component Value Date   IGGSERUM 1,164 10/09/2021   IGA 423 10/09/2021   IGMSERUM 31 10/09/2021   Lab Results  Component Value Date   TOTALPROTELP 6.9 10/09/2021   ALBUMINELP 3.9 10/09/2021   A1GS 0.2 10/09/2021   A2GS 0.5 10/09/2021   BETS 1.0 10/09/2021   GAMS 1.2 10/09/2021   MSPIKE Not Observed 10/09/2021   SPEI Comment 01/05/2018     Chemistry      Component Value Date/Time   NA 140 10/09/2021 1159   NA 143 06/30/2017 1049   NA 140 10/03/2016 0845   K 3.8 10/09/2021 1159   K 4.0 06/30/2017 1049   K 4.3 10/03/2016 0845   CL 105 10/09/2021 1159   CL 105 06/30/2017 1049   CO2 27 10/09/2021 1159   CO2 27 06/30/2017 1049   CO2 26 10/03/2016 0845   BUN 13 10/09/2021 1159   BUN 13 06/30/2017 1049   BUN 11.9 10/03/2016 0845   CREATININE 0.87 10/09/2021 1159   CREATININE 1.0 06/30/2017 1049   CREATININE 0.9 10/03/2016 0845      Component Value Date/Time   CALCIUM 9.4 10/09/2021 1159   CALCIUM 9.0 06/30/2017 1049   CALCIUM 9.9 10/03/2016 0845   ALKPHOS 43 10/09/2021 1159   ALKPHOS 57 06/30/2017 1049   ALKPHOS 80 10/03/2016 0845   AST 42 (H) 10/09/2021 1159   AST 28 10/03/2016 0845   ALT 45 (H) 10/09/2021 1159   ALT 43 06/30/2017 1049   ALT 26 10/03/2016 0845   BILITOT 1.0 10/09/2021 1159   BILITOT 0.48 10/03/2016 0845      Impression and Plan: Mr. Lobello is a pleasant 66 yo gentleman with IgG kappa myeloma.  He ultimately underwent an autologous stem cell transplant at [facility] in February 2018.   He really looks good right now.  I am very happy for him.  Hopefully, the WelChol will help with his diarrhea.  We will go ahead with his Zometa  today.  We will go ahead and plan to get him back in 3 months now.  I feel much better given that he is doing better, his labs look okay, and he is active.   [name], MD 5/26/20232:04 PM

## 2021-12-09 LAB — IGG, IGA, IGM
IgA: 391 mg/dL (ref 61–437)
IgG (Immunoglobin G), Serum: 1084 mg/dL (ref 603–1613)
IgM (Immunoglobulin M), Srm: 23 mg/dL (ref 20–172)

## 2021-12-10 ENCOUNTER — Encounter: Payer: Self-pay | Admitting: Hematology & Oncology

## 2021-12-10 LAB — IRON AND IRON BINDING CAPACITY (CC-WL,HP ONLY)
Iron: 186 ug/dL — ABNORMAL HIGH (ref 45–182)
Saturation Ratios: 53 % — ABNORMAL HIGH (ref 17.9–39.5)
TIBC: 350 ug/dL (ref 250–450)
UIBC: 164 ug/dL (ref 117–376)

## 2021-12-10 LAB — PROTEIN ELECTROPHORESIS, SERUM, WITH REFLEX
A/G Ratio: 1.5 (ref 0.7–1.7)
Albumin ELP: 4 g/dL (ref 2.9–4.4)
Alpha-1-Globulin: 0.2 g/dL (ref 0.0–0.4)
Alpha-2-Globulin: 0.5 g/dL (ref 0.4–1.0)
Beta Globulin: 0.9 g/dL (ref 0.7–1.3)
Gamma Globulin: 1.1 g/dL (ref 0.4–1.8)
Globulin, Total: 2.7 g/dL (ref 2.2–3.9)
Total Protein ELP: 6.7 g/dL (ref 6.0–8.5)

## 2021-12-10 LAB — KAPPA/LAMBDA LIGHT CHAINS
Kappa free light chain: 38.9 mg/L — ABNORMAL HIGH (ref 3.3–19.4)
Kappa, lambda light chain ratio: 1.93 — ABNORMAL HIGH (ref 0.26–1.65)
Lambda free light chains: 20.2 mg/L (ref 5.7–26.3)

## 2021-12-27 ENCOUNTER — Other Ambulatory Visit: Payer: Self-pay | Admitting: *Deleted

## 2021-12-27 DIAGNOSIS — C9000 Multiple myeloma not having achieved remission: Secondary | ICD-10-CM

## 2021-12-27 MED ORDER — LENALIDOMIDE 5 MG PO CAPS: ORAL_CAPSULE | ORAL | 0 refills | Status: DC

## 2022-01-06 ENCOUNTER — Encounter: Payer: Self-pay | Admitting: Hematology & Oncology

## 2022-01-07 ENCOUNTER — Other Ambulatory Visit: Payer: Self-pay | Admitting: Emergency Medicine

## 2022-01-29 ENCOUNTER — Other Ambulatory Visit: Payer: Self-pay | Admitting: Hematology & Oncology

## 2022-01-29 DIAGNOSIS — C9000 Multiple myeloma not having achieved remission: Secondary | ICD-10-CM

## 2022-01-30 ENCOUNTER — Other Ambulatory Visit: Payer: Self-pay | Admitting: *Deleted

## 2022-01-30 DIAGNOSIS — C9000 Multiple myeloma not having achieved remission: Secondary | ICD-10-CM

## 2022-01-30 MED ORDER — LENALIDOMIDE 5 MG PO CAPS: ORAL_CAPSULE | ORAL | 0 refills | Status: DC

## 2022-02-07 ENCOUNTER — Emergency Department (HOSPITAL_BASED_OUTPATIENT_CLINIC_OR_DEPARTMENT_OTHER)
Admission: EM | Admit: 2022-02-07 | Discharge: 2022-02-07 | Disposition: A | Discharge status: Home / Self Care | Payer: Medicare Other | Attending: Emergency Medicine | Admitting: Emergency Medicine

## 2022-02-07 ENCOUNTER — Encounter (HOSPITAL_BASED_OUTPATIENT_CLINIC_OR_DEPARTMENT_OTHER): Payer: Self-pay

## 2022-02-07 ENCOUNTER — Other Ambulatory Visit: Payer: Self-pay

## 2022-02-07 ENCOUNTER — Emergency Department (HOSPITAL_BASED_OUTPATIENT_CLINIC_OR_DEPARTMENT_OTHER): Payer: Medicare Other

## 2022-02-07 DIAGNOSIS — Z7901 Long term (current) use of anticoagulants: Secondary | ICD-10-CM | POA: Diagnosis not present

## 2022-02-07 DIAGNOSIS — M79675 Pain in left toe(s): Secondary | ICD-10-CM | POA: Diagnosis present

## 2022-02-07 DIAGNOSIS — M109 Gout, unspecified: Secondary | ICD-10-CM | POA: Diagnosis not present

## 2022-02-07 LAB — CBC WITH DIFFERENTIAL/PLATELET
Abs Immature Granulocytes: 0.02 10*3/uL (ref 0.00–0.07)
Basophils Absolute: 0.1 10*3/uL (ref 0.0–0.1)
Basophils Relative: 1 %
Eosinophils Absolute: 0.2 10*3/uL (ref 0.0–0.5)
Eosinophils Relative: 5 %
HCT: 44.9 % (ref 39.0–52.0)
Hemoglobin: 15.2 g/dL (ref 13.0–17.0)
Immature Granulocytes: 0 %
Lymphocytes Relative: 39 %
Lymphs Abs: 1.8 10*3/uL (ref 0.7–4.0)
MCH: 34.9 pg — ABNORMAL HIGH (ref 26.0–34.0)
MCHC: 33.9 g/dL (ref 30.0–36.0)
MCV: 103.2 fL — ABNORMAL HIGH (ref 80.0–100.0)
Monocytes Absolute: 0.6 10*3/uL (ref 0.1–1.0)
Monocytes Relative: 12 %
Neutro Abs: 2 10*3/uL (ref 1.7–7.7)
Neutrophils Relative %: 43 %
Platelets: 139 10*3/uL — ABNORMAL LOW (ref 150–400)
RBC: 4.35 MIL/uL (ref 4.22–5.81)
RDW: 14.3 % (ref 11.5–15.5)
WBC: 4.7 10*3/uL (ref 4.0–10.5)
nRBC: 0 % (ref 0.0–0.2)

## 2022-02-07 LAB — BASIC METABOLIC PANEL
Anion gap: 5 (ref 5–15)
BUN: 15 mg/dL (ref 8–23)
CO2: 27 mmol/L (ref 22–32)
Calcium: 8.8 mg/dL — ABNORMAL LOW (ref 8.9–10.3)
Chloride: 109 mmol/L (ref 98–111)
Creatinine, Ser: 0.98 mg/dL (ref 0.61–1.24)
GFR, Estimated: >60 mL/min (ref >60)
Glucose, Bld: 97 mg/dL (ref 70–99)
Potassium: 4.8 mmol/L (ref 3.5–5.1)
Sodium: 141 mmol/L (ref 135–145)

## 2022-02-07 MED ORDER — COLCHICINE 0.6 MG PO TABS: 0.6000 mg | ORAL_TABLET | Freq: Every day | ORAL | 0 refills | Status: DC

## 2022-02-07 MED ORDER — COLCHICINE 0.6 MG PO TABS
1.2000 mg | ORAL_TABLET | Freq: Once | ORAL | Status: AC
  Administered 2022-02-07: 1.2 mg via ORAL
  Filled 2022-02-07: qty 2

## 2022-02-07 MED ORDER — CEPHALEXIN 250 MG PO CAPS
500.0000 mg | ORAL_CAPSULE | Freq: Once | ORAL | Status: AC
  Administered 2022-02-07: 500 mg via ORAL
  Filled 2022-02-07: qty 2

## 2022-02-07 MED ORDER — CEPHALEXIN 500 MG PO CAPS: 500.0000 mg | ORAL_CAPSULE | Freq: Four times a day (QID) | ORAL | 0 refills | Status: DC

## 2022-02-07 NOTE — ED Provider Notes (Signed)
[facility] Provider Note   CSN: [id number] Arrival date & time: 02/07/22  0354     History  Chief Complaint  Patient presents with   Toe Pain    Jorge [name] is a 66 y.o. male.  Presented to ER due to concern for toe pain.  He has noted pain in redness to his left great toe over the past couple days.  He denies any fevers or chills, overall has been feeling well.  No recent illnesses.  No recent trauma.  He reports he had similar episode in August 2022 and episode in summer 2018.  The episode in 2022 was treated successfully with oral medications and did not require hospitalization in 2018 however he was hospitalized for cellulitis.  He has multiple myeloma, on chronic immunosuppressive therapy.  Additional history was obtained through chart review, I reviewed discharge summary from 2018 for cellulitis of his foot on the right side.  HPI     Home Medications Prior to Admission medications   Medication Sig Start Date End Date Taking? Authorizing Provider  cephALEXin (KEFLEX) 500 MG capsule Take 1 capsule (500 mg total) by mouth 4 (four) times daily for 7 days. 02/07/22 02/14/22 Yes Nalaya Wojdyla, Ellwood Dense, MD  colchicine 0.6 MG tablet Take 1 tablet (0.6 mg total) by mouth daily. 02/07/22  Yes Lucrezia Starch, MD  acetaminophen (TYLENOL) 325 MG tablet Take 2 tablets (650 mg total) by mouth every 6 (six) hours as needed for mild pain (or Fever >/= 101). 02/04/18   Eugenie Filler, MD  albuterol (VENTOLIN HFA) 108 (90 Base) MCG/ACT inhaler TAKE 2 PUFFS BY MOUTH EVERY 6 HOURS AS NEEDED FOR WHEEZE OR SHORTNESS OF BREATH 06/04/20   [name], MD  amLODipine (NORVASC) 5 MG tablet Take 1 tablet (5 mg total) by mouth daily. 07/24/21 10/22/21  Saguier, Percell Miller, PA-C  cetirizine (ZYRTEC) 10 MG tablet Take 10 mg by mouth daily as needed for allergies.    [provider]  Cholecalciferol (VITAMIN D3) 250 MCG (10000 UT) capsule Take 10,000 Units by mouth  daily.    [provider]  colesevelam (WELCHOL) 625 MG tablet Take 1 tablet (625 mg total) by mouth 2 (two) times daily with a meal. 12/06/21   Ennever, Rudell Cobb, MD  fluorouracil (EFUDEX) 5 % cream 2 (two) times daily as needed. 08/31/19   [provider]  fluticasone (FLONASE) 50 MCG/ACT nasal spray SPRAY 2 SPRAYS INTO EACH NOSTRIL EVERY DAY 04/02/20   Saguier, Percell Miller, PA-C  gabapentin (NEURONTIN) 300 MG capsule Take 300 mg by mouth 2 (two) times daily. 09/09/21   [provider]  ipratropium (ATROVENT) 0.03 % nasal spray Place 1-2 sprays into both nostrils 2 (two) times daily as needed (nasal drainage). 05/28/21   Garnet Sierras, DO  lenalidomide (REVLIMID) 5 MG capsule TAKE 1 CAPSULE BY MOUTH DAILY  FOR 21 DAYS, THEN 7 DAYS OFF 01/30/22   [name], MD  lidocaine (LIDODERM) 5 % Place 1 patch onto the skin daily as needed (pain). Patient not taking: Reported on 11/26/2021 12/19/16   [provider]  Loperamide HCl (IMODIUM PO) Take by mouth daily.    [provider]  SYMBICORT 160-4.5 MCG/ACT inhaler TAKE 2 PUFFS BY MOUTH TWICE A DAY Patient not taking: Reported on 11/26/2021 07/16/20   Saguier, Percell Miller, PA-C  valsartan (DIOVAN) 160 MG tablet TAKE 1 TABLET BY MOUTH EVERY DAY 01/10/22   Byrum, Rose Fillers, MD  XARELTO 10 MG TABS  tablet TAKE 1 TABLET BY MOUTH EVERY DAY 09/09/21   [name], MD  zolpidem (AMBIEN) 5 MG tablet Take 1 tablet (5 mg total) by mouth at bedtime as needed for sleep. 08/06/21   Saguier, Percell Miller, PA-C      Allergies    Singulair [montelukast]    Review of Systems   Review of Systems  Constitutional:  Negative for chills and fever.  HENT:  Negative for ear pain and sore throat.   Eyes:  Negative for pain and visual disturbance.  Respiratory:  Negative for cough and shortness of breath.   Cardiovascular:  Negative for chest pain and palpitations.  Gastrointestinal:  Negative for abdominal pain and vomiting.  Genitourinary:   Negative for dysuria and hematuria.  Musculoskeletal:  Positive for arthralgias. Negative for back pain.  Skin:  Positive for rash. Negative for color change.  Neurological:  Negative for seizures and syncope.  All other systems reviewed and are negative.   Physical Exam Updated Vital Signs BP 122/82   Pulse 61   Temp 97.8 F (36.6 C) (Oral)   Resp 17   Ht _0  (1.702 m)   Wt 81.6 kg   SpO2 97%   BMI 28.19 kg/m  Physical Exam Vitals and nursing note reviewed.  Constitutional:      General: He is not in acute distress.    Appearance: He is well-developed.  HENT:     Head: Normocephalic and atraumatic.  Eyes:     Conjunctiva/sclera: Conjunctivae normal.  Cardiovascular:     Rate and Rhythm: Normal rate and regular rhythm.     Heart sounds: No murmur heard. Pulmonary:     Effort: Pulmonary effort is normal. No respiratory distress.     Breath sounds: Normal breath sounds.  Abdominal:     Palpations: Abdomen is soft.     Tenderness: There is no abdominal tenderness.  Musculoskeletal:     Cervical back: Neck supple.     Comments: Left foot: There is some erythema to the base of his left great toe, tenderness to palpation at base of left great toe, blanchable, no fluctuance or induration noted, slight swelling noted, normal DP and PT pulse, normal sensation and motor  Skin:    General: Skin is warm and dry.     Capillary Refill: Capillary refill takes less than 2 seconds.  Neurological:     Mental Status: He is alert.  Psychiatric:        Mood and Affect: Mood normal.     ED Results / Procedures / Treatments   Labs (all labs ordered are listed, but only abnormal results are displayed) Labs Reviewed  CBC WITH DIFFERENTIAL/PLATELET - Abnormal; Notable for the following components:      Result Value   MCV 103.2 (*)    MCH 34.9 (*)    Platelets 139 (*)    All other components within normal limits  BASIC METABOLIC PANEL - Abnormal; Notable for the following  components:   Calcium 8.8 (*)    All other components within normal limits    EKG None  Radiology DG Foot Complete Left  Result Date: 02/07/2022 CLINICAL DATA:  Left foot pain. Redness at first toe. Concern for osteomyelitis. EXAM: LEFT FOOT - COMPLETE 3+ VIEW COMPARISON:  Left foot radiographs 04/06/2018 FINDINGS: Mild dorsal talonavicular degenerative osteophytosis. There is mild great toe soft tissue swelling which appears similar to prior. Minimal peripheral medial aspect of the great toe metatarsal head degenerative spurring. No cortical erosion  is seen. No subcutaneous air. No acute fracture or dislocation. IMPRESSION: No radiographic evidence of osteomyelitis. Electronically Signed   By: Yvonne Kendall M.D.   On: 02/07/2022 09:51    Procedures Procedures    Medications Ordered in ED Medications  colchicine tablet 1.2 mg (1.2 mg Oral Given 02/07/22 0955)  cephALEXin (KEFLEX) capsule 500 mg (500 mg Oral Given 02/07/22 6387)    ED Course/ Medical Decision Making/ A&P                           Medical Decision Making Amount and/or Complexity of Data Reviewed Labs: ordered. Radiology: ordered.  Risk Prescription drug management.   66 year old gentleman with multiple myeloma presenting for left great toe pain.  On physical exam he has some erythema and tenderness to the base of his left great toe.  Based on his physical examination have strong suspicion for acute gout flare.  He does report having similar flare at that later developed cellulitis requiring hospitalization in 2018.  I reviewed this discharge summary.  Out of an abundance of precaution, will start patient on course of antibiotics.  Due to his history of multiple myeloma on chronic immunosuppressive therapy, checked basic labs.  He has no leukocytosis or leukopenia.  Renal function stable.  He is on chronic anticoagulation for DVT history.  Will give dose of colchicine here and give Rx for very short course of colchicine  at home in addition to the antibiotics.  Discussed plan with patient and wife.  They are both agreeable.  Discharged home in stable condition.  After the discussed management above, the patient was determined to be safe for discharge.  The patient was in agreement with this plan and all questions regarding their care were answered.  ED return precautions were discussed and the patient will return to the ED with any significant worsening of condition.         Final Clinical Impression(s) / ED Diagnoses Final diagnoses:  Acute gout involving toe of left foot, unspecified cause    Rx / DC Orders ED Discharge Orders          Ordered    cephALEXin (KEFLEX) 500 MG capsule  4 times daily        02/07/22 1001    colchicine 0.6 MG tablet  Daily        02/07/22 1001              Lucrezia Starch, MD 02/07/22 1016

## 2022-02-07 NOTE — ED Triage Notes (Signed)
Pt reports redness, pain and swelling left great toe. Redness moving across foot.  X 2 days.. No injury

## 2022-02-07 NOTE — Discharge Instructions (Addendum)
I would recommend following up with your primary care doctor as well as your oncologist.  Take both the colchicine and the antibiotics as prescribed.  If you are having increasing redness, swelling, any fever or chills, please return to the ER for reassessment at that time.

## 2022-02-10 ENCOUNTER — Ambulatory Visit (INDEPENDENT_AMBULATORY_CARE_PROVIDER_SITE_OTHER): Payer: Medicare Other | Admitting: Medical

## 2022-02-10 ENCOUNTER — Ambulatory Visit (HOSPITAL_BASED_OUTPATIENT_CLINIC_OR_DEPARTMENT_OTHER)
Admission: RE | Admit: 2022-02-10 | Discharge: 2022-02-10 | Disposition: A | Discharge status: Home / Self Care | Payer: Medicare Other | Source: Ambulatory Visit | Attending: Medical | Admitting: Medical

## 2022-02-10 VITALS — BP 112/74 | HR 64 | Temp 98.2°F | Resp 18 | Ht 67.0 in | Wt 185.0 lb

## 2022-02-10 DIAGNOSIS — M79672 Pain in left foot: Secondary | ICD-10-CM

## 2022-02-10 DIAGNOSIS — R61 Generalized hyperhidrosis: Secondary | ICD-10-CM | POA: Diagnosis not present

## 2022-02-10 DIAGNOSIS — L03116 Cellulitis of left lower limb: Secondary | ICD-10-CM | POA: Diagnosis not present

## 2022-02-10 DIAGNOSIS — R6883 Chills (without fever): Secondary | ICD-10-CM

## 2022-02-10 LAB — CBC WITH DIFFERENTIAL/PLATELET
Basophils Absolute: 0 10*3/uL (ref 0.0–0.1)
Basophils Relative: 0.6 % (ref 0.0–3.0)
Eosinophils Absolute: 0.1 10*3/uL (ref 0.0–0.7)
Eosinophils Relative: 1.5 % (ref 0.0–5.0)
HCT: 46.5 % (ref 39.0–52.0)
Hemoglobin: 15.7 g/dL (ref 13.0–17.0)
Lymphocytes Relative: 40.6 % (ref 12.0–46.0)
Lymphs Abs: 2.1 10*3/uL (ref 0.7–4.0)
MCHC: 33.6 g/dL (ref 30.0–36.0)
MCV: 104.1 fl — ABNORMAL HIGH (ref 78.0–100.0)
Monocytes Absolute: 0.6 10*3/uL (ref 0.1–1.0)
Monocytes Relative: 12.4 % — ABNORMAL HIGH (ref 3.0–12.0)
Neutro Abs: 2.3 10*3/uL (ref 1.4–7.7)
Neutrophils Relative %: 44.9 % (ref 43.0–77.0)
Platelets: 135 10*3/uL — ABNORMAL LOW (ref 150.0–400.0)
RBC: 4.47 Mil/uL (ref 4.22–5.81)
RDW: 15.3 % (ref 11.5–15.5)
WBC: 5.2 10*3/uL (ref 4.0–10.5)

## 2022-02-10 LAB — URIC ACID: Uric Acid, Serum: 7.8 mg/dL (ref 4.0–7.8)

## 2022-02-10 LAB — SEDIMENTATION RATE: Sed Rate: 4 mm/hr (ref 0–20)

## 2022-02-10 MED ORDER — COLCHICINE 0.6 MG PO TABS: 0.6000 mg | ORAL_TABLET | Freq: Two times a day (BID) | ORAL | 0 refills | Status: DC

## 2022-02-10 MED ORDER — CEFTRIAXONE SODIUM 1 G IJ SOLR
1.0000 g | Freq: Once | INTRAMUSCULAR | Status: AC
  Administered 2022-02-10: 1 g via INTRAMUSCULAR

## 2022-02-10 MED ORDER — DOXYCYCLINE HYCLATE 100 MG PO TABS: 100.0000 mg | ORAL_TABLET | Freq: Two times a day (BID) | ORAL | 0 refills | Status: DC

## 2022-02-10 NOTE — Addendum Note (Signed)
Addended by: Jeronimo Greaves on: 02/10/2022 10:09 AM   Modules accepted: Orders

## 2022-02-10 NOTE — Addendum Note (Signed)
Addended by: Anabel Halon on: 02/10/2022 11:34 AM   Modules accepted: Orders

## 2022-02-10 NOTE — Progress Notes (Signed)
Subjective:    Patient ID: Jorge [name], male    DOB: March 05, 1956, 66 y.o.   MRN: [id number]  HPI  Pt in with left great toe pain. Pt went to ED the other day. Pt states he has been on both gout meds  and antibiotic.    Below summary date.  Arrival date & time: 02/07/22  9826     History      Chief Complaint  Patient presents with   Toe Pain      Jorge [name] is a 66 y.o. male.  Presented to ER due to concern for toe pain.  He has noted pain in redness to his left great toe over the past couple days.  He denies any fevers or chills, overall has been feeling well.  No recent illnesses.  No recent trauma.  He reports he had similar episode in August 2022 and episode in summer 2018.  The episode in 2022 was treated successfully with oral medications and did not require hospitalization in 2018 however he was hospitalized for cellulitis.  He has multiple myeloma, on chronic immunosuppressive therapy.  Additional history was obtained through chart review, I reviewed discharge summary from 2018 for cellulitis of his foot on the right side.   Musculoskeletal:     Cervical back: Neck supple.     Comments: Left foot: There is some erythema to the base of his left great toe, tenderness to palpation at base of left great toe, blanchable, no fluctuance or induration noted, slight swelling noted, normal DP and PT pulse, normal sensation and motor    ED Course/ Medical Decision Making/ A&P                         Medical Decision Making Amount and/or Complexity of Data Reviewed Labs: ordered. Radiology: ordered.   Risk Prescription drug management.     66 year old gentleman with multiple myeloma presenting for left great toe pain.  On physical exam he has some erythema and tenderness to the base of his left great toe.  Based on his physical examination have strong suspicion for acute gout flare.  He does report having similar flare at that later developed cellulitis requiring  hospitalization in 2018.  I reviewed this discharge summary.  Out of an abundance of precaution, will start patient on course of antibiotics.  Due to his history of multiple myeloma on chronic immunosuppressive therapy, checked basic labs.  He has no leukocytosis or leukopenia.  Renal function stable.  He is on chronic anticoagulation for DVT history.  Will give dose of colchicine here and give Rx for very short course of colchicine at home in addition to the antibiotics.   Discussed plan with patient and wife.  They are both agreeable.  Discharged home in stable condition.   After the discussed management above, the patient was determined to be safe for discharge.  The patient was in agreement with this plan and all questions regarding their care were answered.  ED return precautions were discussed and the patient will return to the ED with any significant worsening of condition."  Pt has been on keflex 500 mg qid. Also has been on colchicine.  Pt cbc other day did not show elevated wbc.   Last night he had some chills with some sweats.   Pt  just stopped  Revlemiid for his 7 days off. He also plans to travel to [place] in one week for a 2 week trip.  Sin leaving the ED he has worse area of redness. He only had slight minimal pink red area medial great toe.       Review of Systems  Constitutional:  Negative for chills, fatigue and fever.  Respiratory:  Negative for cough, chest tightness and wheezing.   Cardiovascular:  Negative for chest pain and palpitations.  Gastrointestinal:  Negative for abdominal pain.  Endocrine: Negative for polydipsia and polyuria.  Genitourinary:  Negative for difficulty urinating, enuresis, flank pain, frequency, penile discharge, penile pain and scrotal swelling.  Musculoskeletal:        Left foot pain    Past Medical History:  Diagnosis Date   Allergy    Anxiety    Asthma    Blood transfusion without reported diagnosis 2018   Bone metastasis     Chest cold 05/19/2016   productive cough  -- started on antibiotic   Chronic back pain    due to bone mets from myeloma   Cough    Depression    Diverticulitis    GERD (gastroesophageal reflux disease)    Hiatal hernia    History of chicken pox    History of concussion    age 16 -- no residual   History of DVT of lower extremity 03/21/2016  treated and completed w/ xarelto   per doppler left extensive occlusion common femoral, femoral, and popliteal veins and right partial occlusion common femoral and profunda femoral veins/  last doppler 06-04-2016 no evidence acute or chronic dvt noted either leg    History of radiation therapy 06/09/16-06/23/16   lower thoracic spine 25 Gy in 10 fractions   Mouth ulcers    secondary to radiation   Multiple myeloma ([place]) dx 02/22/2016 via bone marrow bx---  oncologist-  [name]   IgG Kappa-- Hyperdiploid/ +11 w/ bone mets--  current treatment chemotherapy (started 08/ 2017)and pallitive radiation to back started 06-09-2016   Renal calculus, right    Wears contact lenses      Social History   Socioeconomic History   Marital status: Married    Spouse name: Not on file   Number of children: 3   Years of education: Not on file   Highest education level: Not on file  Occupational History   Not on file  Tobacco Use   Smoking status: Former    Packs/day: 1.00    Years: 7.00    Total pack years: 7.00    Types: Cigarettes    Quit date: 11/02/1980    Years since quitting: 41.3   Smokeless tobacco: Never  Vaping Use   Vaping Use: Never used  Substance and Sexual Activity   Alcohol use: Yes    Comment: occasional   Drug use: No   Sexual activity: Never  Other Topics Concern   Not on file  Social History Narrative   Not on file   Social Determinants of Health   Financial Resource Strain: Low Risk  (07/02/2021)   Overall Financial Resource Strain (CARDIA)    Difficulty of Paying Living Expenses: Not hard at all  Food Insecurity: No  Food Insecurity (07/02/2021)   Hunger Vital Sign    Worried About Running Out of Food in the Last Year: Never true    Ran Out of Food in the Last Year: Never true  Transportation Needs: No Transportation Needs (07/02/2021)   PRAPARE - Hydrologist (Medical): No    Lack of Transportation (Non-Medical): No  Physical Activity: Sufficiently Active (  07/02/2021)   Exercise Vital Sign    Days of Exercise per Week: 7 days    Minutes of Exercise per Session: 60 min  Stress: No Stress Concern Present (07/02/2021)   [place]    Feeling of Stress : Not at all  Social Connections: Moderately Integrated (07/02/2021)   Social Connection and Isolation Panel [NHANES]    Frequency of Communication with Friends and Family: More than three times a week    Frequency of Social Gatherings with Friends and Family: More than three times a week    Attends Religious Services: More than 4 times per year    Active Member of [employer] or Organizations: No    Attends Archivist Meetings: Never    Marital Status: Married  Human resources officer Violence: Not At Risk (07/02/2021)   Humiliation, Afraid, Rape, and Kick questionnaire    Fear of Current or Ex-Partner: No    Emotionally Abused: No    Physically Abused: No    Sexually Abused: No    Past Surgical History:  Procedure Laterality Date   COLONOSCOPY  2008,2013   COLONOSCOPY  01/07/2017   Dr Penelope Coop, [facility]   CYSTOSCOPY W/ URETERAL STENT PLACEMENT Right 06/20/2016   Procedure: CYSTOSCOPY WITH STENT REPLACEMENT;  Surgeon: Kathie Rhodes, MD;  Location: [facility];  Service: Urology;  Laterality: Right;   CYSTOSCOPY WITH RETROGRADE PYELOGRAM, URETEROSCOPY AND STENT PLACEMENT Right 05/30/2016   Procedure: CYSTOSCOPY WITH RETROGRADE PYELOGRAM, URETEROSCOPY AND STENT PLACEMENT,DILITATION URETERAL STRICTURE;  Surgeon: Kathie Rhodes, MD;  Location: [facility];   Service: Urology;  Laterality: Right;   CYSTOSCOPY/RETROGRADE/URETEROSCOPY/STONE EXTRACTION WITH BASKET Right 06/20/2016   Procedure: CYSTOSCOPY/URETEROSCOPY/STONE EXTRACTION WITH BASKET;  Surgeon: Kathie Rhodes, MD;  Location: [facility];  Service: Urology;  Laterality: Right;   HIATAL HERNIA REPAIR N/A 08/13/2018   Procedure: LAPAROSCOPIC REPAIR OF HIATAL HERNIA REPAIR WITH FUNDOPLICATION AND INSERTION OF MESH;  Surgeon: Excell Seltzer, MD;  Location: [facility];  Service: General;  Laterality: N/A;   HOLMIUM LASER APPLICATION Right 96/08/9526   Procedure: HOLMIUM LASER APPLICATION;  Surgeon: Kathie Rhodes, MD;  Location: [facility];  Service: Urology;  Laterality: Right;   IR GENERIC HISTORICAL  02/11/2016   IR RADIOLOGIST EVAL & MGMT 02/11/2016 [facility]   IR GENERIC HISTORICAL  02/15/2016   IR BONE TUMOR(S)RF ABLATION 02/15/2016 Luanne Bras, MD [facility]   IR GENERIC HISTORICAL  02/15/2016   IR BONE TUMOR(S)RF ABLATION 02/15/2016 Luanne Bras, MD [facility]   IR GENERIC HISTORICAL  02/15/2016   IR BONE TUMOR(S)RF ABLATION 02/15/2016 Luanne Bras, MD [facility]   IR GENERIC HISTORICAL  02/15/2016   IR KYPHO THORACIC WITH BONE BIOPSY 02/15/2016 Luanne Bras, MD [facility]   IR GENERIC HISTORICAL  02/15/2016   IR KYPHO THORACIC WITH BONE BIOPSY 02/15/2016 Luanne Bras, MD [facility]   IR GENERIC HISTORICAL  02/15/2016   IR VERTEBROPLASTY CERV/THOR BX INC UNI/BIL INC/INJECT/IMAGING 02/15/2016 Luanne Bras, MD [facility]   IR GENERIC HISTORICAL  03/13/2016   IR KYPHO EA ADDL LEVEL THORACIC OR LUMBAR 03/13/2016 Luanne Bras, MD [facility]   IR GENERIC HISTORICAL  03/13/2016   IR KYPHO EA ADDL LEVEL THORACIC OR LUMBAR 03/13/2016 Luanne Bras, MD [facility]   IR GENERIC HISTORICAL  03/13/2016   IR BONE TUMOR(S)RF ABLATION 03/13/2016 Luanne Bras, MD [facility]   IR GENERIC HISTORICAL  03/13/2016   IR KYPHO LUMBAR INC FX  REDUCE BONE  BX UNI/BIL CANNULATION INC/IMAGING 03/13/2016 Luanne Bras, MD [facility]   IR GENERIC HISTORICAL  03/13/2016   IR BONE TUMOR(S)RF ABLATION 03/13/2016 Luanne Bras, MD [facility]   IR GENERIC HISTORICAL  03/13/2016   IR BONE TUMOR(S)RF ABLATION 03/13/2016 Luanne Bras, MD [facility]   IR GENERIC HISTORICAL  03/31/2016   IR RADIOLOGIST EVAL & MGMT 03/31/2016 [facility]   KYPHOPLASTY     02/2016, 03/2016, 11/2016   LAPAROSCOPIC INGUINAL HERNIA REPAIR Bilateral 12-16-2013  dr gross   RADIOLOGY WITH ANESTHESIA N/A 02/15/2016   Procedure: Spinal Ablation;  Surgeon: Luanne Bras, MD;  Location: [place];  Service: Radiology;  Laterality: N/A;   RADIOLOGY WITH ANESTHESIA N/A 03/13/2016   Procedure: LUMBER ABLATION;  Surgeon: Luanne Bras, MD;  Location: [place];  Service: Radiology;  Laterality: N/A;   ROTATOR CUFF REPAIR Right 2003   spine operation     x1 in 2018 x3 in 2017   TONSILLECTOMY  age 35   VERTEBROPLASTY  01/2016   WISDOM TOOTH EXTRACTION      Family History  Problem Relation Age of Onset   Uterine cancer Mother    Ovarian cancer Mother    Colon cancer Mother        said it was rectal or colon but not sure   Rectal cancer Mother    Heart disease Father    Hypertension Father    Multiple sclerosis Sister    Paranoid behavior Brother    Drug abuse Brother    Schizophrenia Brother    Stroke Maternal Grandfather    Cancer Maternal Aunt    Leukemia Paternal Aunt    Healthy Son        x1   Healthy Daughter        x2   Allergies Daughter        x1   Diabetes Neg Hx    Alzheimer's disease Neg Hx    Parkinson's disease Neg Hx    Esophageal cancer Neg Hx    Stomach cancer Neg Hx     Allergies  Allergen Reactions   Singulair [Montelukast] Other (See Comments)    Mood change    Current Outpatient Medications on File Prior to Visit  Medication Sig Dispense Refill   acetaminophen (TYLENOL) 325 MG tablet Take 2 tablets (650 mg total) by  mouth every 6 (six) hours as needed for mild pain (or Fever >/= 101).     albuterol (VENTOLIN HFA) 108 (90 Base) MCG/ACT inhaler TAKE 2 PUFFS BY MOUTH EVERY 6 HOURS AS NEEDED FOR WHEEZE OR SHORTNESS OF BREATH 6.7 each 1   cephALEXin (KEFLEX) 500 MG capsule Take 1 capsule (500 mg total) by mouth 4 (four) times daily for 7 days. 28 capsule 0   cetirizine (ZYRTEC) 10 MG tablet Take 10 mg by mouth daily as needed for allergies.     Cholecalciferol (VITAMIN D3) 250 MCG (10000 UT) capsule Take 10,000 Units by mouth daily.     colchicine 0.6 MG tablet Take 1 tablet (0.6 mg total) by mouth daily. 3 tablet 0   colesevelam (WELCHOL) 625 MG tablet Take 1 tablet (625 mg total) by mouth 2 (two) times daily with a meal. 60 tablet 5   fluorouracil (EFUDEX) 5 % cream 2 (two) times daily as needed.     fluticasone (FLONASE) 50 MCG/ACT nasal spray SPRAY 2 SPRAYS INTO EACH NOSTRIL EVERY DAY 16 mL 1   gabapentin (NEURONTIN) 300 MG capsule Take 300 mg by mouth 2 (two) times  daily.     ipratropium (ATROVENT) 0.03 % nasal spray Place 1-2 sprays into both nostrils 2 (two) times daily as needed (nasal drainage). 30 mL 5   lenalidomide (REVLIMID) 5 MG capsule TAKE 1 CAPSULE BY MOUTH DAILY  FOR 21 DAYS, THEN 7 DAYS OFF 21 capsule 0   lidocaine (LIDODERM) 5 % Place 1 patch onto the skin daily as needed (pain).  2   Loperamide HCl (IMODIUM PO) Take by mouth daily.     SYMBICORT 160-4.5 MCG/ACT inhaler TAKE 2 PUFFS BY MOUTH TWICE A DAY 10.2 each 0   valsartan (DIOVAN) 160 MG tablet TAKE 1 TABLET BY MOUTH EVERY DAY 30 tablet 3   XARELTO 10 MG TABS tablet TAKE 1 TABLET BY MOUTH EVERY DAY 90 tablet 3   zolpidem (AMBIEN) 5 MG tablet Take 1 tablet (5 mg total) by mouth at bedtime as needed for sleep. 30 tablet 1   amLODipine (NORVASC) 5 MG tablet Take 1 tablet (5 mg total) by mouth daily. 90 tablet 3   Current Facility-Administered Medications on File Prior to Visit  Medication Dose Route Frequency Provider Last Rate Last Admin    0.9 %  sodium chloride infusion   Intravenous PRN Caylan Chenard, Percell Miller, PA-C       heparin lock flush 100 unit/mL  500 Units Intravenous Once Celso Amy, NP       sodium chloride flush (NS) 0.9 % injection 3 mL  3 mL Intravenous Once PRN Celso Amy, NP        BP 112/74   Pulse 64   Temp 98.2 F (36.8 C)   Resp 18   Ht '5\' 7"'  (1.702 m)   Wt 185 lb (83.9 kg)   SpO2 96%   BMI 28.98 kg/m        Objective:   Physical Exam  General- No acute distress. Pleasant patient. Neck- Full range of motion, no jvd Lungs- Clear, even and unlabored. Heart- regular rate and rhythm. Neurologic- CNII- XII grossly intact.   Left foot- tender swollen left great toe. Redness extend to distal 1st, 2nd and 3rd metatarsal. Also on lateral view medial aspect left great is swollen. No alread of fluctuance or breakdown.      Assessment & Plan:   Patient Instructions  Left foot redness, warmth and tenderness.  On discussion and review of emergency department note recent note recently looks recent concern for cellulitis versus gout flare.  Minimal small area of redness in ED and you are given Keflex which appears not adequate enough.  We will have the stop Keflex and prescribe doxycycline antibiotic.  Also we gave Rocephin 1 g IM in the office.  The area has spread by a year description.  I did mark the area of redness and tenderness today.  Will get stat CBC, sed rate, lactic acid and left foot x-ray.  He has redness and tender area extends upward advise emergency department evaluation.  Hopefully you will need IV and admission as was required in 2018.  I did review that discharge summary today.  You could increase your colchicine to twice daily.  Follow-up this Wednesday at 920 or sooner if needed.   Mackie Pai, PA-C

## 2022-02-10 NOTE — Patient Instructions (Addendum)
Left foot redness, warmth and tenderness.  On discussion and review of emergency department note recent note recently looks recent concern for cellulitis versus gout flare.  Minimal small area of redness in ED and you are given Keflex which appears not adequate enough.  We will have the stop Keflex and prescribe doxycycline antibiotic.  Also we gave Rocephin 1 g IM in the office.  The area has spread by a year description.  I did mark the area of redness and tenderness today.  Will get stat CBC, sed rate, lactic acid and left foot x-ray.  He has redness and tender area extends upward advise emergency department evaluation.  Hopefully you will need IV and admission as was required in 2018.  I did review that discharge summary today.  You could increase your colchicine to twice daily.  Follow-up this Wednesday at 920 or sooner if needed.

## 2022-02-12 ENCOUNTER — Other Ambulatory Visit: Payer: Self-pay

## 2022-02-12 ENCOUNTER — Emergency Department (HOSPITAL_BASED_OUTPATIENT_CLINIC_OR_DEPARTMENT_OTHER): Payer: Medicare Other

## 2022-02-12 ENCOUNTER — Encounter (HOSPITAL_COMMUNITY): Payer: Self-pay

## 2022-02-12 ENCOUNTER — Inpatient Hospital Stay (HOSPITAL_BASED_OUTPATIENT_CLINIC_OR_DEPARTMENT_OTHER)
Admission: EM | Admit: 2022-02-12 | Discharge: 2022-02-15 | DRG: 603 | Disposition: A | Discharge status: Home / Self Care | Payer: Medicare Other | Attending: Internal Medicine | Admitting: Internal Medicine

## 2022-02-12 ENCOUNTER — Encounter (HOSPITAL_BASED_OUTPATIENT_CLINIC_OR_DEPARTMENT_OTHER): Payer: Self-pay

## 2022-02-12 ENCOUNTER — Ambulatory Visit (INDEPENDENT_AMBULATORY_CARE_PROVIDER_SITE_OTHER): Payer: Medicare Other | Admitting: Medical

## 2022-02-12 VITALS — BP 118/78 | HR 60 | Resp 18 | Ht 67.0 in | Wt 185.0 lb

## 2022-02-12 DIAGNOSIS — L0390 Cellulitis, unspecified: Secondary | ICD-10-CM | POA: Diagnosis present

## 2022-02-12 DIAGNOSIS — M1000 Idiopathic gout, unspecified site: Secondary | ICD-10-CM

## 2022-02-12 DIAGNOSIS — Z87891 Personal history of nicotine dependence: Secondary | ICD-10-CM | POA: Diagnosis not present

## 2022-02-12 DIAGNOSIS — Z9484 Stem cells transplant status: Secondary | ICD-10-CM

## 2022-02-12 DIAGNOSIS — F32A Depression, unspecified: Secondary | ICD-10-CM | POA: Diagnosis present

## 2022-02-12 DIAGNOSIS — G8929 Other chronic pain: Secondary | ICD-10-CM | POA: Diagnosis present

## 2022-02-12 DIAGNOSIS — Z7901 Long term (current) use of anticoagulants: Secondary | ICD-10-CM | POA: Diagnosis not present

## 2022-02-12 DIAGNOSIS — Z9221 Personal history of antineoplastic chemotherapy: Secondary | ICD-10-CM | POA: Diagnosis not present

## 2022-02-12 DIAGNOSIS — Z79899 Other long term (current) drug therapy: Secondary | ICD-10-CM

## 2022-02-12 DIAGNOSIS — C9000 Multiple myeloma not having achieved remission: Secondary | ICD-10-CM | POA: Diagnosis present

## 2022-02-12 DIAGNOSIS — Z888 Allergy status to other drugs, medicaments and biological substances status: Secondary | ICD-10-CM | POA: Diagnosis not present

## 2022-02-12 DIAGNOSIS — F419 Anxiety disorder, unspecified: Secondary | ICD-10-CM | POA: Diagnosis not present

## 2022-02-12 DIAGNOSIS — Z923 Personal history of irradiation: Secondary | ICD-10-CM | POA: Diagnosis not present

## 2022-02-12 DIAGNOSIS — L03032 Cellulitis of left toe: Secondary | ICD-10-CM

## 2022-02-12 DIAGNOSIS — M549 Dorsalgia, unspecified: Secondary | ICD-10-CM | POA: Diagnosis not present

## 2022-02-12 DIAGNOSIS — L03116 Cellulitis of left lower limb: Secondary | ICD-10-CM

## 2022-02-12 DIAGNOSIS — M109 Gout, unspecified: Secondary | ICD-10-CM | POA: Diagnosis present

## 2022-02-12 DIAGNOSIS — D696 Thrombocytopenia, unspecified: Secondary | ICD-10-CM | POA: Diagnosis not present

## 2022-02-12 DIAGNOSIS — K219 Gastro-esophageal reflux disease without esophagitis: Secondary | ICD-10-CM | POA: Diagnosis not present

## 2022-02-12 DIAGNOSIS — R053 Chronic cough: Secondary | ICD-10-CM | POA: Diagnosis not present

## 2022-02-12 DIAGNOSIS — I82503 Chronic embolism and thrombosis of unspecified deep veins of lower extremity, bilateral: Secondary | ICD-10-CM | POA: Diagnosis not present

## 2022-02-12 DIAGNOSIS — Z86718 Personal history of other venous thrombosis and embolism: Secondary | ICD-10-CM

## 2022-02-12 DIAGNOSIS — I10 Essential (primary) hypertension: Secondary | ICD-10-CM | POA: Diagnosis not present

## 2022-02-12 LAB — CBC WITH DIFFERENTIAL/PLATELET
Abs Immature Granulocytes: 0 10*3/uL (ref 0.00–0.07)
Basophils Absolute: 0.1 10*3/uL (ref 0.0–0.1)
Basophils Relative: 1 %
Eosinophils Absolute: 0.1 10*3/uL (ref 0.0–0.5)
Eosinophils Relative: 1 %
HCT: 45 % (ref 39.0–52.0)
Hemoglobin: 15.3 g/dL (ref 13.0–17.0)
Immature Granulocytes: 0 %
Lymphocytes Relative: 43 %
Lymphs Abs: 2 10*3/uL (ref 0.7–4.0)
MCH: 34.9 pg — ABNORMAL HIGH (ref 26.0–34.0)
MCHC: 34 g/dL (ref 30.0–36.0)
MCV: 102.7 fL — ABNORMAL HIGH (ref 80.0–100.0)
Monocytes Absolute: 0.7 10*3/uL (ref 0.1–1.0)
Monocytes Relative: 14 %
Neutro Abs: 1.9 10*3/uL (ref 1.7–7.7)
Neutrophils Relative %: 41 %
Platelets: 144 10*3/uL — ABNORMAL LOW (ref 150–400)
RBC: 4.38 MIL/uL (ref 4.22–5.81)
RDW: 13.9 % (ref 11.5–15.5)
WBC: 4.6 10*3/uL (ref 4.0–10.5)
nRBC: 0 % (ref 0.0–0.2)

## 2022-02-12 LAB — COMPREHENSIVE METABOLIC PANEL
ALT: 35 U/L (ref 0–44)
AST: 34 U/L (ref 15–41)
Albumin: 3.9 g/dL (ref 3.5–5.0)
Alkaline Phosphatase: 50 U/L (ref 38–126)
Anion gap: 3 — ABNORMAL LOW (ref 5–15)
BUN: 14 mg/dL (ref 8–23)
CO2: 26 mmol/L (ref 22–32)
Calcium: 8.9 mg/dL (ref 8.9–10.3)
Chloride: 109 mmol/L (ref 98–111)
Creatinine, Ser: 0.95 mg/dL (ref 0.61–1.24)
GFR, Estimated: >60 mL/min (ref >60)
Glucose, Bld: 84 mg/dL (ref 70–99)
Potassium: 4.2 mmol/L (ref 3.5–5.1)
Sodium: 138 mmol/L (ref 135–145)
Total Bilirubin: 1 mg/dL (ref 0.3–1.2)
Total Protein: 7.4 g/dL (ref 6.5–8.1)

## 2022-02-12 LAB — LACTIC ACID, PLASMA
LACTIC ACID: 1.4 mmol/L (ref 0.4–1.8)
Lactic Acid, Venous: 1.3 mmol/L (ref 0.5–1.9)

## 2022-02-12 LAB — C-REACTIVE PROTEIN: CRP: 2.5 mg/dL — ABNORMAL HIGH (ref <1.0)

## 2022-02-12 LAB — URIC ACID: Uric Acid, Serum: 6.5 mg/dL (ref 3.7–8.6)

## 2022-02-12 LAB — PROCALCITONIN: Procalcitonin: <0.1 ng/mL

## 2022-02-12 LAB — HIV ANTIBODY (ROUTINE TESTING W REFLEX): HIV Screen 4th Generation wRfx: NONREACTIVE

## 2022-02-12 LAB — SEDIMENTATION RATE: Sed Rate: 23 mm/hr — ABNORMAL HIGH (ref 0–16)

## 2022-02-12 MED ORDER — ACETAMINOPHEN 650 MG RE SUPP: 650.0000 mg | Freq: Four times a day (QID) | RECTAL | Status: DC | PRN

## 2022-02-12 MED ORDER — ORAL CARE MOUTH RINSE: 15.0000 mL | OROMUCOSAL | Status: DC | PRN

## 2022-02-12 MED ORDER — ACETAMINOPHEN 325 MG PO TABS
650.0000 mg | ORAL_TABLET | Freq: Four times a day (QID) | ORAL | Status: DC | PRN
  Administered 2022-02-12 – 2022-02-14 (×2): 650 mg via ORAL
  Filled 2022-02-12 (×2): qty 2

## 2022-02-12 MED ORDER — ZOLPIDEM TARTRATE 5 MG PO TABS
5.0000 mg | ORAL_TABLET | Freq: Every evening | ORAL | Status: DC | PRN
  Administered 2022-02-12: 5 mg via ORAL
  Filled 2022-02-12: qty 1

## 2022-02-12 MED ORDER — VANCOMYCIN HCL IN DEXTROSE 1-5 GM/200ML-% IV SOLN
1000.0000 mg | Freq: Once | INTRAVENOUS | Status: AC
  Administered 2022-02-12: 1000 mg via INTRAVENOUS
  Filled 2022-02-12: qty 200

## 2022-02-12 MED ORDER — HYDROCODONE-ACETAMINOPHEN 5-325 MG PO TABS
1.0000 | ORAL_TABLET | ORAL | Status: DC | PRN
  Administered 2022-02-12: 1 via ORAL
  Filled 2022-02-12: qty 1

## 2022-02-12 MED ORDER — VANCOMYCIN HCL IN DEXTROSE 1-5 GM/200ML-% IV SOLN
1000.0000 mg | Freq: Two times a day (BID) | INTRAVENOUS | Status: DC
  Administered 2022-02-12 – 2022-02-14 (×5): 1000 mg via INTRAVENOUS
  Filled 2022-02-12 (×6): qty 200

## 2022-02-12 MED ORDER — VANCOMYCIN HCL IN DEXTROSE 1-5 GM/200ML-% IV SOLN: 1000.0000 mg | Freq: Once | INTRAVENOUS | Status: DC

## 2022-02-12 MED ORDER — SODIUM CHLORIDE 0.9 % IV SOLN
2.0000 g | Freq: Once | INTRAVENOUS | Status: AC
  Administered 2022-02-12: 2 g via INTRAVENOUS
  Filled 2022-02-12: qty 12.5

## 2022-02-12 MED ORDER — SODIUM CHLORIDE 0.9 % IV BOLUS
1000.0000 mL | Freq: Once | INTRAVENOUS | Status: AC
  Administered 2022-02-12: 1000 mL via INTRAVENOUS

## 2022-02-12 MED ORDER — VANCOMYCIN HCL IN DEXTROSE 1-5 GM/200ML-% IV SOLN
1000.0000 mg | Freq: Once | INTRAVENOUS | Status: DC
Start: 2022-02-12 — End: 2022-02-12

## 2022-02-12 MED ORDER — COLCHICINE 0.6 MG PO TABS
0.6000 mg | ORAL_TABLET | Freq: Two times a day (BID) | ORAL | Status: DC
  Administered 2022-02-12 – 2022-02-14 (×5): 0.6 mg via ORAL
  Filled 2022-02-12 (×6): qty 1

## 2022-02-12 MED ORDER — VANCOMYCIN HCL 500 MG IV SOLR
500.0000 mg | Freq: Once | INTRAVENOUS | Status: AC
  Administered 2022-02-12: 500 mg via INTRAVENOUS

## 2022-02-12 MED ORDER — SODIUM CHLORIDE 0.9 % IV SOLN
2.0000 g | Freq: Three times a day (TID) | INTRAVENOUS | Status: DC
  Administered 2022-02-12 – 2022-02-15 (×9): 2 g via INTRAVENOUS
  Filled 2022-02-12 (×9): qty 12.5

## 2022-02-12 MED ORDER — ONDANSETRON HCL 4 MG/2ML IJ SOLN: 4.0000 mg | Freq: Four times a day (QID) | INTRAMUSCULAR | Status: DC | PRN

## 2022-02-12 MED ORDER — ONDANSETRON HCL 4 MG PO TABS: 4.0000 mg | ORAL_TABLET | Freq: Four times a day (QID) | ORAL | Status: DC | PRN

## 2022-02-12 MED ORDER — RIVAROXABAN 10 MG PO TABS
10.0000 mg | ORAL_TABLET | Freq: Every day | ORAL | Status: DC
  Administered 2022-02-12 – 2022-02-14 (×3): 10 mg via ORAL
  Filled 2022-02-12 (×3): qty 1

## 2022-02-12 MED ORDER — SODIUM CHLORIDE 0.9 % IV SOLN: INTRAVENOUS | Status: DC

## 2022-02-12 NOTE — ED Triage Notes (Signed)
Redness, pain & swelling to left foot 7/27, seen [facility], [place] Monday. Started on antibiotics, taking doxycycline.

## 2022-02-12 NOTE — H&P (Signed)
History and Physical    Patient: Jorge [name] NTI:[id number] DOB: 1955-08-01 DOA: 02/12/2022 DOS: the patient was seen and examined on 02/12/2022 PCP: Mackie Pai, PA-C  Patient coming from: Home  Chief Complaint:  Chief Complaint  Patient presents with   Foot Swelling   HPI: Jorge [name] is a 66 y.o. male with medical history significant of HTN, MVP, MM, chronic cough, GERD. Presenting with 1 week of left foot pain. It started in his left great toe. He had sharp, constant pain. OTC meds for pain did not help. He noticed some swelling and decided to go to the ED because this reminded him of a previous episode of cellulitis. He didn't have any apparent injury. He didn't have any fevers. He reports possible chills. No drainage noted in the area. In the ED he was given a prescription for keflex 515m QID and colchicine 0.676mdaily. His symptoms did not improve. 2 days ago, he saw his PCP. His colchicine was increased to BID dosing, he was give rocephin 1g IM, and sent home with doxycycline. His pain did not seem to improve over these last 2 days, so he returned to the ED for assistance.    Review of Systems: As mentioned in the history of present illness. All other systems reviewed and are negative. Past Medical History:  Diagnosis Date   Allergy    Anxiety    Asthma    Blood transfusion without reported diagnosis 2018   Bone metastasis    Chest cold 05/19/2016   productive cough  -- started on antibiotic   Chronic back pain    due to bone mets from myeloma   Cough    Depression    Diverticulitis    GERD (gastroesophageal reflux disease)    Hiatal hernia    History of chicken pox    History of concussion    age [age]- no residual   History of DVT of lower extremity 03/21/2016  treated and completed w/ xarelto   per doppler left extensive occlusion common femoral, femoral, and popliteal veins and right partial occlusion common femoral and profunda femoral veins/  last doppler  06-04-2016 no evidence acute or chronic dvt noted either leg    History of radiation therapy 06/09/16-06/23/16   lower thoracic spine 25 Gy in 10 fractions   Mouth ulcers    secondary to radiation   Multiple myeloma (HCFruitdaledx 02/22/2016 via bone marrow bx---  oncologist-  dr enMarin Olp IgG Kappa-- Hyperdiploid/ +11 w/ bone mets--  current treatment chemotherapy (started 08/ 2017)and pallitive radiation to back started 06-09-2016   Renal calculus, right    Wears contact lenses    Past Surgical History:  Procedure Laterality Date   COLONOSCOPY  2008,2013   COLONOSCOPY  01/07/2017   Dr GaPenelope CoopeaSadie HaberI   CYSTOSCOPY W/ URETERAL STENT PLACEMENT Right 06/20/2016   Procedure: CYSTOSCOPY WITH STENT REPLACEMENT;  Surgeon: MaKathie RhodesMD;  [facility] Service[facility] Urology;  Laterality: Right;   CYSTOSCOPY WITH RETROGRADE PYELOGRAM, URETEROSCOPY AND STENT PLACEMENT Right 05/30/2016   Procedure: CYSTOSCOPY WITH RETROGRADE PYELOGRAM, URETEROSCOPY AND STENT PLACEMENT,DILITATION URETERAL STRICTURE;  Surgeon: MaKathie RhodesMD;  Location: [facility];  Service: Urology;  Laterality: Right;   CYSTOSCOPY/RETROGRADE/URETEROSCOPY/STONE EXTRACTION WITH BASKET Right 06/20/2016   Procedure: CYSTOSCOPY/URETEROSCOPY/STONE EXTRACTION WITH BASKET;  Surgeon: MaKathie RhodesMD;  [facility];  Laterality: Right;   HIATAL HERNIA REPAIR N/A 08/13/2018   Procedure: LAPAROSCOPIC REPAIR OF HIATAL  HERNIA REPAIR WITH FUNDOPLICATION AND INSERTION OF MESH;  Surgeon: Excell Seltzer, MD;  Location: [facility];  Service: General;  Laterality: N/A;   HOLMIUM LASER APPLICATION Right 87/12/8113   Procedure: HOLMIUM LASER APPLICATION;  Surgeon: Kathie Rhodes, MD;  Location: [facility];  Service: Urology;  Laterality: Right;   IR GENERIC HISTORICAL  02/11/2016   IR RADIOLOGIST EVAL & MGMT 02/11/2016 [facility]   IR GENERIC HISTORICAL  02/15/2016   IR BONE TUMOR(S)RF  ABLATION 02/15/2016 Luanne Bras, MD [facility]   IR GENERIC HISTORICAL  02/15/2016   IR BONE TUMOR(S)RF ABLATION 02/15/2016 Luanne Bras, MD [facility]   IR GENERIC HISTORICAL  02/15/2016   IR BONE TUMOR(S)RF ABLATION 02/15/2016 Luanne Bras, MD [facility]   IR GENERIC HISTORICAL  02/15/2016   IR KYPHO THORACIC WITH BONE BIOPSY 02/15/2016 Luanne Bras, MD [facility]   IR GENERIC HISTORICAL  02/15/2016   IR KYPHO THORACIC WITH BONE BIOPSY 02/15/2016 Luanne Bras, MD [facility]   IR GENERIC HISTORICAL  02/15/2016   IR VERTEBROPLASTY CERV/THOR BX INC UNI/BIL INC/INJECT/IMAGING 02/15/2016 Luanne Bras, MD [facility]   IR GENERIC HISTORICAL  03/13/2016   IR KYPHO EA ADDL LEVEL THORACIC OR LUMBAR 03/13/2016 Luanne Bras, MD [facility]   IR GENERIC HISTORICAL  03/13/2016   IR KYPHO EA ADDL LEVEL THORACIC OR LUMBAR 03/13/2016 Luanne Bras, MD [facility]   IR GENERIC HISTORICAL  03/13/2016   IR BONE TUMOR(S)RF ABLATION 03/13/2016 Luanne Bras, MD [facility]   IR GENERIC HISTORICAL  03/13/2016   IR KYPHO LUMBAR INC FX REDUCE BONE BX UNI/BIL CANNULATION INC/IMAGING 03/13/2016 Luanne Bras, MD [facility]   IR GENERIC HISTORICAL  03/13/2016   IR BONE TUMOR(S)RF ABLATION 03/13/2016 Luanne Bras, MD [facility]   IR GENERIC HISTORICAL  03/13/2016   IR BONE TUMOR(S)RF ABLATION 03/13/2016 Luanne Bras, MD [facility]   IR GENERIC HISTORICAL  03/31/2016   IR RADIOLOGIST EVAL & MGMT 03/31/2016 [facility]   KYPHOPLASTY     02/2016, 03/2016, 11/2016   LAPAROSCOPIC INGUINAL HERNIA REPAIR Bilateral 12-16-2013  dr gross   RADIOLOGY WITH ANESTHESIA N/A 02/15/2016   Procedure: Spinal Ablation;  Surgeon: Luanne Bras, MD;  Location: [place];  Service: Radiology;  Laterality: N/A;   RADIOLOGY WITH ANESTHESIA N/A 03/13/2016   Procedure: LUMBER ABLATION;  Surgeon: Luanne Bras, MD;  Location: [place];  Service: Radiology;  Laterality: N/A;   ROTATOR  CUFF REPAIR Right 2003   spine operation     x1 in 2018 x3 in 2017   TONSILLECTOMY  age 35   VERTEBROPLASTY  01/2016   WISDOM TOOTH EXTRACTION     Social History:  reports that he quit smoking about 41 years ago. His smoking use included cigarettes. He has a 7.00 pack-year smoking history. He has never used smokeless tobacco. He reports current alcohol use. He reports that he does not use drugs.  Allergies  Allergen Reactions   Singulair [Montelukast] Other (See Comments)    Mood change    Family History  Problem Relation Age of Onset   Uterine cancer Mother    Ovarian cancer Mother    Colon cancer Mother        said it was rectal or colon but not sure   Rectal cancer Mother    Heart disease Father    Hypertension Father    Multiple sclerosis Sister    Paranoid behavior Brother    Drug abuse Brother    Schizophrenia Brother  Stroke Maternal Grandfather    Cancer Maternal Aunt    Leukemia Paternal Aunt    Healthy Son        x1   Healthy Daughter        x2   Allergies Daughter        x1   Diabetes Neg Hx    Alzheimer's disease Neg Hx    Parkinson's disease Neg Hx    Esophageal cancer Neg Hx    Stomach cancer Neg Hx     Prior to Admission medications   Medication Sig Start Date End Date Taking? Authorizing Provider  acetaminophen (TYLENOL) 325 MG tablet Take 2 tablets (650 mg total) by mouth every 6 (six) hours as needed for mild pain (or Fever >/= 101). 02/04/18   Eugenie Filler, MD  albuterol (VENTOLIN HFA) 108 (90 Base) MCG/ACT inhaler TAKE 2 PUFFS BY MOUTH EVERY 6 HOURS AS NEEDED FOR WHEEZE OR SHORTNESS OF BREATH 06/04/20   [name], MD  amLODipine (NORVASC) 5 MG tablet Take 1 tablet (5 mg total) by mouth daily. 07/24/21 10/22/21  Saguier, Percell Miller, PA-C  cephALEXin (KEFLEX) 500 MG capsule Take 1 capsule (500 mg total) by mouth 4 (four) times daily for 7 days. 02/07/22 02/14/22  Lucrezia Starch, MD  cetirizine (ZYRTEC) 10 MG tablet Take 10 mg by mouth daily  as needed for allergies.    [provider]  Cholecalciferol (VITAMIN D3) 250 MCG (10000 UT) capsule Take 10,000 Units by mouth daily.    [provider]  colchicine 0.6 MG tablet Take 1 tablet (0.6 mg total) by mouth 2 (two) times daily. 02/10/22   Saguier, Percell Miller, PA-C  colesevelam [facility]) 625 MG tablet Take 1 tablet (625 mg total) by mouth 2 (two) times daily with a meal. 12/06/21   Ennever, Rudell Cobb, MD  doxycycline (VIBRA-TABS) 100 MG tablet Take 1 tablet (100 mg total) by mouth 2 (two) times daily. Can give capsules or generic, 02/10/22   Saguier, Percell Miller, PA-C  fluorouracil (EFUDEX) 5 % cream 2 (two) times daily as needed. 08/31/19   [provider]  fluticasone (FLONASE) 50 MCG/ACT nasal spray SPRAY 2 SPRAYS INTO EACH NOSTRIL EVERY DAY 04/02/20   Saguier, Percell Miller, PA-C  gabapentin (NEURONTIN) 300 MG capsule Take 300 mg by mouth 2 (two) times daily. 09/09/21   [provider]  ipratropium (ATROVENT) 0.03 % nasal spray Place 1-2 sprays into both nostrils 2 (two) times daily as needed (nasal drainage). 05/28/21   Garnet Sierras, DO  lenalidomide (REVLIMID) 5 MG capsule TAKE 1 CAPSULE BY MOUTH DAILY  FOR 21 DAYS, THEN 7 DAYS OFF 01/30/22   [name], MD  lidocaine (LIDODERM) 5 % Place 1 patch onto the skin daily as needed (pain). 12/19/16   [provider]  Loperamide HCl (IMODIUM PO) Take by mouth daily.    [provider]  SYMBICORT 160-4.5 MCG/ACT inhaler TAKE 2 PUFFS BY MOUTH TWICE A DAY 07/16/20   Saguier, Percell Miller, PA-C  valsartan (DIOVAN) 160 MG tablet TAKE 1 TABLET BY MOUTH EVERY DAY 01/10/22   Byrum, Rose Fillers, MD  XARELTO 10 MG TABS tablet TAKE 1 TABLET BY MOUTH EVERY DAY 09/09/21   [name], MD  zolpidem (AMBIEN) 5 MG tablet Take 1 tablet (5 mg total) by mouth at bedtime as needed for sleep. 08/06/21   Mackie Pai, PA-C    Physical Exam: Vitals:   02/12/22 1145 02/12/22 1230 02/12/22 1245 02/12/22 1408  BP: 126/89 127/86 122/83  Pulse: (!) 58 (!) 57 (!) 54   Resp: _0 Temp:   (!) 97.5 F (36.4 C)   SpO2: 98% 96% 96%   Weight:    83.9 kg  Height:    5' 7" (1.702 m)   General: 66 y.o. male resting in bed in NAD Eyes: PERRL, normal sclera ENMT: Nares patent w/o discharge, orophaynx clear, dentition normal, ears w/o discharge/lesions/ulcers Neck: Supple, trachea midline Cardiovascular: RRR, +S1, S2, no m/g/r, equal pulses throughout Respiratory: CTABL, no w/r/r, normal WOB GI: BS+, NDNT, no masses noted, no organomegaly noted MSK: No c/c; mild edema and erythema of left great toe Neuro: A&O x 3, no focal deficits Psyc: Appropriate interaction and affect, calm/cooperative  Data Reviewed:    Lab Results  Component Value Date   NA 138 02/12/2022   K 4.2 02/12/2022   CO2 26 02/12/2022   GLUCOSE 84 02/12/2022   BUN 14 02/12/2022   CREATININE 0.95 02/12/2022   CALCIUM 8.9 02/12/2022   EGFR 90 (L) 10/03/2016   GFRNONAA >60 02/12/2022   CRP 2.5 ESR  23  Lab Results  Component Value Date   WBC 4.6 02/12/2022   HGB 15.3 02/12/2022   HCT 45.0 02/12/2022   MCV 102.7 (H) 02/12/2022   PLT 144 (L) 02/12/2022   XR Left foot 1. Soft tissue swelling of the forefoot greatest around the great toe metatarsophalangeal joint is similar to prior. 2. Minimal great toe metatarsophalangeal joint osteoarthritis. 3. No radiographic evidence of inflammatory arthropathy such as gout.  Assessment and Plan: Left foot pain/cellulitis of left foot     - placed in obs, med-surg     - ok to continue current abx for now     - XR w/o indication of osteo     - ?if this is more gout than cellulitis; will check procal and rpt uric acid (it was upper limit of normal a few days ago and he's only been on Tx-dose colchicine for 2 days     - will continue colchicine for now  Multiple Myeloma     - continue onco follow up; added to care team  Chronic cough     - continue home regimen when confirmed  Chronic BLE DVT      - continue home regimen when confirmed  HTN     - continue home regimen when confirmed  Chronic thrombocytopenia     - stable, monitor   Advance Care Planning:   Code Status: DNI; confirmed with patient through multiple lines of quesitoning.   Consults: None  Family Communication: w/ wife at bedside  Severity of Illness: The appropriate patient status for this patient is OBSERVATION. Observation status is judged to be reasonable and necessary in order to provide the required intensity of service to ensure the patient's safety. The patient's presenting symptoms, physical exam findings, and initial radiographic and laboratory data in the context of their medical condition is felt to place them at decreased risk for further clinical deterioration. Furthermore, it is anticipated that the patient will be medically stable for discharge from the hospital within 2 midnights of admission.   Author: Jonnie Finner, DO 02/12/2022 2:11 PM  For on call review [email or web address].

## 2022-02-12 NOTE — ED Provider Notes (Signed)
[place] EMERGENCY DEPARTMENT Provider Note   CSN: [id number] Arrival date & time: 02/12/22  1011     History  Chief Complaint  Patient presents with   Foot Swelling    Jorge [name] is a 66 y.o. male.  HPI    66 year old male with medical history significant for anxiety, depression, gout, multiple myeloma recently stopped Revlimid who presents to the emergency department with concern for gout flare and superimposed cellulitis with failed outpatient treatment of oral antibiotics.  The patient was seen in the emergency department on 02/07/2022, diagnosed with acute gout flare, placed on oral antibiotics.  Seen by his PCP in follow-up on 7/31 during which time his antibiotics were switched from Keflex to doxycycline.  He has had worsening redness and swelling and pain in the foot despite being on outpatient antibiotics and being on gout treatment with colchicine.  He represented to his PCP clinic today due to persistent concern for cellulitis of the left foot and was sent to the emergency department for likely admission due to failed outpatient treatment of oral antibiotics.  He denies any fevers or chills.   Home Medications Prior to Admission medications   Medication Sig Start Date End Date Taking? Authorizing Provider  acetaminophen (TYLENOL) 325 MG tablet Take 2 tablets (650 mg total) by mouth every 6 (six) hours as needed for mild pain (or Fever >/= 101). 02/04/18   Eugenie Filler, MD  albuterol (VENTOLIN HFA) 108 (90 Base) MCG/ACT inhaler TAKE 2 PUFFS BY MOUTH EVERY 6 HOURS AS NEEDED FOR WHEEZE OR SHORTNESS OF BREATH 06/04/20   [name], MD  amLODipine (NORVASC) 5 MG tablet Take 1 tablet (5 mg total) by mouth daily. 07/24/21 10/22/21  Saguier, Percell Miller, PA-C  cephALEXin (KEFLEX) 500 MG capsule Take 1 capsule (500 mg total) by mouth 4 (four) times daily for 7 days. 02/07/22 02/14/22  Lucrezia Starch, MD  cetirizine (ZYRTEC) 10 MG tablet Take 10 mg by mouth daily as  needed for allergies.    [provider]  Cholecalciferol (VITAMIN D3) 250 MCG (10000 UT) capsule Take 10,000 Units by mouth daily.    [provider]  colchicine 0.6 MG tablet Take 1 tablet (0.6 mg total) by mouth 2 (two) times daily. 02/10/22   Saguier, Percell Miller, PA-C  colesevelam [facility]) 625 MG tablet Take 1 tablet (625 mg total) by mouth 2 (two) times daily with a meal. 12/06/21   Ennever, Rudell Cobb, MD  doxycycline (VIBRA-TABS) 100 MG tablet Take 1 tablet (100 mg total) by mouth 2 (two) times daily. Can give capsules or generic, 02/10/22   Saguier, Percell Miller, PA-C  fluorouracil (EFUDEX) 5 % cream 2 (two) times daily as needed. 08/31/19   [provider]  fluticasone (FLONASE) 50 MCG/ACT nasal spray SPRAY 2 SPRAYS INTO EACH NOSTRIL EVERY DAY 04/02/20   Saguier, Percell Miller, PA-C  gabapentin (NEURONTIN) 300 MG capsule Take 300 mg by mouth 2 (two) times daily. 09/09/21   [provider]  ipratropium (ATROVENT) 0.03 % nasal spray Place 1-2 sprays into both nostrils 2 (two) times daily as needed (nasal drainage). 05/28/21   Garnet Sierras, DO  lenalidomide (REVLIMID) 5 MG capsule TAKE 1 CAPSULE BY MOUTH DAILY  FOR 21 DAYS, THEN 7 DAYS OFF 01/30/22   [name], MD  lidocaine (LIDODERM) 5 % Place 1 patch onto the skin daily as needed (pain). 12/19/16   [provider]  Loperamide HCl (IMODIUM PO) Take by mouth daily.    [provider]  SYMBICORT 160-4.5 MCG/ACT inhaler TAKE 2 PUFFS BY MOUTH TWICE A DAY 07/16/20   Saguier, Percell Miller, PA-C  valsartan (DIOVAN) 160 MG tablet TAKE 1 TABLET BY MOUTH EVERY DAY 01/10/22   Byrum, Rose Fillers, MD  XARELTO 10 MG TABS tablet TAKE 1 TABLET BY MOUTH EVERY DAY 09/09/21   [name], MD  zolpidem (AMBIEN) 5 MG tablet Take 1 tablet (5 mg total) by mouth at bedtime as needed for sleep. 08/06/21   Saguier, Percell Miller, PA-C      Allergies    Singulair [montelukast]    Review of Systems   Review of Systems  All other systems reviewed  and are negative.   Physical Exam Updated Vital Signs BP 122/83   Pulse (!) 54   Temp (!) 97.5 F (36.4 C)   Resp 16   SpO2 96%  Physical Exam Vitals and nursing note reviewed.  Constitutional:      General: He is not in acute distress. HENT:     Head: Normocephalic and atraumatic.  Eyes:     Conjunctiva/sclera: Conjunctivae normal.     Pupils: Pupils are equal, round, and reactive to light.  Cardiovascular:     Rate and Rhythm: Normal rate and regular rhythm.  Pulmonary:     Effort: Pulmonary effort is normal. No respiratory distress.  Abdominal:     General: There is no distension.     Tenderness: There is no guarding.  Musculoskeletal:        General: Swelling and tenderness present. No deformity or signs of injury.     Cervical back: Neck supple.     Comments: 2+ DP pulses on the left, erythema about the dorsum of the foot with tenderness to palpation, erythema margins have spread beyond previously demarcated line of erythema a few days ago.  Erythema spreads to the toes with some tenderness to palpation of the first 3 digits.  Skin:    Findings: Erythema present. No lesion or rash.  Neurological:     General: No focal deficit present.     Mental Status: He is alert. Mental status is at baseline.     ED Results / Procedures / Treatments   Labs (all labs ordered are listed, but only abnormal results are displayed) Labs Reviewed  COMPREHENSIVE METABOLIC PANEL - Abnormal; Notable for the following components:      Result Value   Anion gap 3 (*)    All other components within normal limits  CBC WITH DIFFERENTIAL/PLATELET - Abnormal; Notable for the following components:   MCV 102.7 (*)    MCH 34.9 (*)    Platelets 144 (*)    All other components within normal limits  SEDIMENTATION RATE - Abnormal; Notable for the following components:   Sed Rate 23 (*)    All other components within normal limits  CULTURE, BLOOD (ROUTINE X 2)  CULTURE, BLOOD (ROUTINE X 2)   LACTIC ACID, PLASMA  C-REACTIVE PROTEIN    EKG None  Radiology DG Foot Complete Left  Result Date: 02/12/2022 CLINICAL DATA:  Left foot redness, pain, and swelling 02/06/2022. EXAM: LEFT FOOT - COMPLETE 3+ VIEW COMPARISON:  Left foot radiographs 02/10/2022 FINDINGS: Mild dorsal talonavicular and tarsometatarsal degenerative osteophytosis on lateral view. Unchanged probable mild-to-moderate soft tissue swelling of the forefoot and toes. This appears to be greatest around the great toe metatarsophalangeal joint. Minimal great toe metatarsophalangeal joint space narrowing and lateral greater than medial degenerative osteophytosis. No cortical erosion or periostitis. IMPRESSION: 1. Soft  tissue swelling of the forefoot greatest around the great toe metatarsophalangeal joint is similar to prior. 2. Minimal great toe metatarsophalangeal joint osteoarthritis. 3. No radiographic evidence of inflammatory arthropathy such as gout. Electronically Signed   By: Yvonne Kendall M.D.   On: 02/12/2022 10:57    Procedures Procedures    Medications Ordered in ED Medications  sodium chloride 0.9 % bolus 1,000 mL (0 mLs Intravenous Stopped 02/12/22 1158)    And  0.9 %  sodium chloride infusion ( Intravenous New Bag/Given 02/12/22 1253)  HYDROcodone-acetaminophen (NORCO/VICODIN) 5-325 MG per tablet 1-2 tablet (1 tablet Oral Given 02/12/22 1115)  vancomycin (VANCOCIN) IVPB 1000 mg/200 mL premix (has no administration in time range)  ceFEPIme (MAXIPIME) 2 g in sodium chloride 0.9 % 100 mL IVPB (has no administration in time range)  ceFEPIme (MAXIPIME) 2 g in sodium chloride 0.9 % 100 mL IVPB (0 g Intravenous Stopped 02/12/22 1152)  vancomycin (VANCOCIN) IVPB 1000 mg/200 mL premix (0 mg Intravenous Stopped 02/12/22 1311)    And  vancomycin (VANCOCIN) 500 mg in sodium chloride 0.9 % 100 mL IVPB (0 mg Intravenous Stopped 02/12/22 1311)    ED Course/ Medical Decision Making/ A&P                           Medical Decision  Making Amount and/or Complexity of Data Reviewed Labs: ordered. Radiology: ordered.  Risk Prescription drug management. Decision regarding hospitalization.   66 year old male with medical history significant for anxiety, depression, gout, multiple myeloma recently stopped Revlimid who presents to the emergency department with concern for gout flare and superimposed cellulitis with failed outpatient treatment of oral antibiotics.  The patient was seen in the emergency department on 02/07/2022, diagnosed with acute gout flare, placed on oral antibiotics.  Seen by his PCP in follow-up on 7/31 during which time his antibiotics were switched from Keflex to doxycycline.  He has had worsening redness and swelling and pain in the foot despite being on outpatient antibiotics and being on gout treatment with colchicine.  He represented to his PCP clinic today due to persistent concern for cellulitis of the left foot and was sent to the emergency department for likely admission due to failed outpatient treatment of oral antibiotics.  He denies any fevers or chills.  On arrival, the patient was vitally stable.  IV access was obtained and the patient was explained a plan for likely admission for failed outpatient treatment of cellulitis.  We will obtain x-ray imaging of the left foot.  Concern primarily for cellulitis.  Low concern for necrotizing soft tissue infection.  No crepitus palpated on exam.  Cellulitis has spread but is not rapidly spreading.  In chart review, the patient appears to have a history of MRSA infection.  He is on immunomodulating therapy.  Will prescribe broad-spectrum antibiotics to include vancomycin and cefepime.  X-ray imaging was performed which was negative for acute fracture, evidence of gout or other abnormality besides soft tissue swelling. IMPRESSION:  1. Soft tissue swelling of the forefoot greatest around the great  toe metatarsophalangeal joint is similar to prior.  2. Minimal  great toe metatarsophalangeal joint osteoarthritis.  3. No radiographic evidence of inflammatory arthropathy such as  gout.   Lower suspicion for acute osteomyelitis at this time.  Evaluation significant for CBC without a leukocytosis or anemia, CMP unremarkable, ESR, CRP pending, lactic acid normal.  Dr. Marylyn Ishihara of hospitalist medicine was consulted for admission.  The patient was  subsequently admitted in stable condition.  Final Clinical Impression(s) / ED Diagnoses Final diagnoses:  Cellulitis of left lower extremity    Rx / DC Orders ED Discharge Orders     None         Regan Lemming, MD 02/12/22 1404

## 2022-02-12 NOTE — Progress Notes (Signed)
Pharmacy Antibiotic Note  Chuck Caban is a 66 y.o. male admitted on 02/12/2022 with  diabetic foot infection .  Pharmacy has been consulted for vancomycin/cefepime dosing. Keflex was recently switched to doxy PTA. SCr 0.95 on presentation.  Plan: Cefepime 2g IV x 1; then 2g IV q8h Vancomycin '1500mg'$  (1g + '500mg'$  due to product availability at [facility] ED) IV x 1; then 1g IV q12h. Goal AUC 400-550. Expected AUC: 513 SCr used: 0.95 Monitor clinical progress, c/s, renal function F/u de-escalation plan/LOT, vancomycin levels as indicated      Temp (24hrs), Avg:97.6 F (36.4 C), Min:97.6 F (36.4 C), Max:97.6 F (36.4 C)  Recent Labs  Lab 02/07/22 0915 02/10/22 1011  WBC 4.7 5.2  CREATININE 0.98  --     Estimated Creatinine Clearance: 77.8 mL/min (by C-G formula based on SCr of 0.98 mg/dL).    Allergies  Allergen Reactions   Singulair [Montelukast] Other (See Comments)    Mood change    Antimicrobials this admission: 8/2 vancomycin >>  8/2 cefepime >>   Dose adjustments this admission:   Microbiology results:   Arturo Morton, PharmD, BCPS Please check AMION for all [place] contact numbers Clinical Pharmacist 02/12/2022 10:39 AM

## 2022-02-12 NOTE — Progress Notes (Signed)
Plan of Care Note for accepted transfer   Patient: Jorge [name] MRN: [id number]   DOA: 02/12/2022  Facility requesting transfer: [facility] Requesting Provider: Dr. Armandina Gemma Reason for transfer: Cellulitis of left foot w/ failure outpt abx Facility course: 66 yo M w. Hx of HTN, gout, MM (recently on immunotherapy). Presenting with cellulitis of foot. Outpt Tx was keflex and doxy. Saw his PCP today without improvement. Recommended going to ED for IV abx. Vital stable. XR foot obtained that is not concerning for osteo per EDP. Started on vanc/cefepime.   Plan of care: The patient is accepted for admission to [place]  unit, at [facility].  While holding at [facility], medical decision making responsibilities remain with the [place]. Upon arrival to [facility], [facility] will assume care. Thank you.   Author: Jonnie Finner, DO 02/12/2022  Check [email or web address] for on-call coverage.  Nursing staff, Please call [place] number on Amion as soon as patient's arrival, so appropriate admitting provider can evaluate the pt.

## 2022-02-12 NOTE — Progress Notes (Signed)
Subjective:    Patient ID: Jorge [name], male    DOB: [birth year], 66 y.o.   MRN: [id number]  HPI  Pt in with more pain in his left foot. Burning sensation into his ankle.  This is despite increasing colchicine twice a day and started doxycline. In addition pt got injection rocephin last visit.   Hx of cellulitis in past that required iv antibiotics and hospitalization.   On Monday night patient was having chills.    Review of Systems  Constitutional:  Negative for chills, fatigue and fever.  Respiratory:  Negative for cough, chest tightness and wheezing.   Cardiovascular:  Negative for chest pain and palpitations.  Gastrointestinal:  Negative for blood in stool and constipation.  Musculoskeletal:  Negative for back pain, joint swelling and neck pain.  Skin:  Positive for rash.  Hematological:  Negative for adenopathy. Does not bruise/bleed easily.  Psychiatric/Behavioral:  Negative for behavioral problems, decreased concentration and hallucinations.    Past Medical History:  Diagnosis Date   Allergy    Anxiety    Asthma    Blood transfusion without reported diagnosis 2018   Bone metastasis    Chest cold 05/19/2016   productive cough  -- started on antibiotic   Chronic back pain    due to bone mets from myeloma   Cough    Depression    Diverticulitis    GERD (gastroesophageal reflux disease)    Hiatal hernia    History of chicken pox    History of concussion    age 5 -- no residual   History of DVT of lower extremity 03/21/2016  treated and completed w/ xarelto   per doppler left extensive occlusion common femoral, femoral, and popliteal veins and right partial occlusion common femoral and profunda femoral veins/  last doppler 06-04-2016 no evidence acute or chronic dvt noted either leg    History of radiation therapy 06/09/16-06/23/16   lower thoracic spine 25 Gy in 10 fractions   Mouth ulcers    secondary to radiation   Multiple myeloma ([place]) dx 02/22/2016 via  bone marrow bx---  oncologist-  [name]   IgG Kappa-- Hyperdiploid/ +11 w/ bone mets--  current treatment chemotherapy (started 08/ 2017)and pallitive radiation to back started 06-09-2016   Renal calculus, right    Wears contact lenses      Social History   Socioeconomic History   Marital status: Married    Spouse name: Not on file   Number of children: 3   Years of education: Not on file   Highest education level: Not on file  Occupational History   Not on file  Tobacco Use   Smoking status: Former    Packs/day: 1.00    Years: 7.00    Total pack years: 7.00    Types: Cigarettes    Quit date: 11/02/1980    Years since quitting: 41.3   Smokeless tobacco: Never  Vaping Use   Vaping Use: Never used  Substance and Sexual Activity   Alcohol use: Yes    Comment: occasional   Drug use: No   Sexual activity: Never  Other Topics Concern   Not on file  Social History Narrative   Not on file   Social Determinants of Health   Financial Resource Strain: Low Risk  (07/02/2021)   Overall Financial Resource Strain (CARDIA)    Difficulty of Paying Living Expenses: Not hard at all  Food Insecurity: No Food Insecurity (07/02/2021)   Hunger Vital Sign  Worried About Charity fundraiser in the Last Year: Never true    [place] in the Last Year: Never true  Transportation Needs: No Transportation Needs (07/02/2021)   PRAPARE - Hydrologist (Medical): No    Lack of Transportation (Non-Medical): No  Physical Activity: Sufficiently Active (07/02/2021)   Exercise Vital Sign    Days of Exercise per Week: 7 days    Minutes of Exercise per Session: 60 min  Stress: No Stress Concern Present (07/02/2021)   [place]    Feeling of Stress : Not at all  Social Connections: Moderately Integrated (07/02/2021)   Social Connection and Isolation Panel [NHANES]    Frequency of Communication  with Friends and Family: More than three times a week    Frequency of Social Gatherings with Friends and Family: More than three times a week    Attends Religious Services: More than 4 times per year    Active Member of [employer] or Organizations: No    Attends Archivist Meetings: Never    Marital Status: Married  Human resources officer Violence: Not At Risk (07/02/2021)   Humiliation, Afraid, Rape, and Kick questionnaire    Fear of Current or Ex-Partner: No    Emotionally Abused: No    Physically Abused: No    Sexually Abused: No    Past Surgical History:  Procedure Laterality Date   COLONOSCOPY  2008,2013   COLONOSCOPY  01/07/2017   Dr Penelope Coop, [facility]   CYSTOSCOPY W/ URETERAL STENT PLACEMENT Right 06/20/2016   Procedure: CYSTOSCOPY WITH STENT REPLACEMENT;  Surgeon: Kathie Rhodes, MD;  Location: [facility];  Service: Urology;  Laterality: Right;   CYSTOSCOPY WITH RETROGRADE PYELOGRAM, URETEROSCOPY AND STENT PLACEMENT Right 05/30/2016   Procedure: CYSTOSCOPY WITH RETROGRADE PYELOGRAM, URETEROSCOPY AND STENT PLACEMENT,DILITATION URETERAL STRICTURE;  Surgeon: Kathie Rhodes, MD;  Location: [facility];  Service: Urology;  Laterality: Right;   CYSTOSCOPY/RETROGRADE/URETEROSCOPY/STONE EXTRACTION WITH BASKET Right 06/20/2016   Procedure: CYSTOSCOPY/URETEROSCOPY/STONE EXTRACTION WITH BASKET;  Surgeon: Kathie Rhodes, MD;  Location: [facility];  Service: Urology;  Laterality: Right;   HIATAL HERNIA REPAIR N/A 08/13/2018   Procedure: LAPAROSCOPIC REPAIR OF HIATAL HERNIA REPAIR WITH FUNDOPLICATION AND INSERTION OF MESH;  Surgeon: Excell Seltzer, MD;  Location: [facility];  Service: General;  Laterality: N/A;   HOLMIUM LASER APPLICATION Right 38/07/8297   Procedure: HOLMIUM LASER APPLICATION;  Surgeon: Kathie Rhodes, MD;  Location: [facility];  Service: Urology;  Laterality: Right;   IR GENERIC HISTORICAL  02/11/2016   IR RADIOLOGIST EVAL & MGMT 02/11/2016  [facility]   IR GENERIC HISTORICAL  02/15/2016   IR BONE TUMOR(S)RF ABLATION 02/15/2016 Luanne Bras, MD [facility]   IR GENERIC HISTORICAL  02/15/2016   IR BONE TUMOR(S)RF ABLATION 02/15/2016 Luanne Bras, MD [facility]   IR GENERIC HISTORICAL  02/15/2016   IR BONE TUMOR(S)RF ABLATION 02/15/2016 Luanne Bras, MD [facility]   IR GENERIC HISTORICAL  02/15/2016   IR KYPHO THORACIC WITH BONE BIOPSY 02/15/2016 Luanne Bras, MD [facility]   IR GENERIC HISTORICAL  02/15/2016   IR KYPHO THORACIC WITH BONE BIOPSY 02/15/2016 Luanne Bras, MD [facility]   IR GENERIC HISTORICAL  02/15/2016   IR VERTEBROPLASTY CERV/THOR BX INC UNI/BIL INC/INJECT/IMAGING 02/15/2016 Luanne Bras, MD [facility]   IR GENERIC HISTORICAL  03/13/2016   IR KYPHO EA ADDL LEVEL THORACIC OR LUMBAR 03/13/2016 Luanne Bras, MD [facility]  IR GENERIC HISTORICAL  03/13/2016   IR KYPHO EA ADDL LEVEL THORACIC OR LUMBAR 03/13/2016 Luanne Bras, MD [facility]   IR GENERIC HISTORICAL  03/13/2016   IR BONE TUMOR(S)RF ABLATION 03/13/2016 Luanne Bras, MD [facility]   IR GENERIC HISTORICAL  03/13/2016   IR KYPHO LUMBAR INC FX REDUCE BONE BX UNI/BIL CANNULATION INC/IMAGING 03/13/2016 Luanne Bras, MD [facility]   IR GENERIC HISTORICAL  03/13/2016   IR BONE TUMOR(S)RF ABLATION 03/13/2016 Luanne Bras, MD [facility]   IR GENERIC HISTORICAL  03/13/2016   IR BONE TUMOR(S)RF ABLATION 03/13/2016 Luanne Bras, MD [facility]   IR GENERIC HISTORICAL  03/31/2016   IR RADIOLOGIST EVAL & MGMT 03/31/2016 [facility]   KYPHOPLASTY     02/2016, 03/2016, 11/2016   LAPAROSCOPIC INGUINAL HERNIA REPAIR Bilateral 12-16-2013  dr gross   RADIOLOGY WITH ANESTHESIA N/A 02/15/2016   Procedure: Spinal Ablation;  Surgeon: Luanne Bras, MD;  Location: [facility];  Service: Radiology;  Laterality: N/A;   RADIOLOGY WITH ANESTHESIA N/A 03/13/2016   Procedure: LUMBER ABLATION;  Surgeon: Luanne Bras, MD;   Location: [facility];  Service: Radiology;  Laterality: N/A;   ROTATOR CUFF REPAIR Right 2003   spine operation     x1 in 2018 x3 in 2017   TONSILLECTOMY  age 39   VERTEBROPLASTY  01/2016   WISDOM TOOTH EXTRACTION      Family History  Problem Relation Age of Onset   Uterine cancer Mother    Ovarian cancer Mother    Colon cancer Mother        said it was rectal or colon but not sure   Rectal cancer Mother    Heart disease Father    Hypertension Father    Multiple sclerosis Sister    Paranoid behavior Brother    Drug abuse Brother    Schizophrenia Brother    Stroke Maternal Grandfather    Cancer Maternal Aunt    Leukemia Paternal Aunt    Healthy Son        x1   Healthy Daughter        x2   Allergies Daughter        x1   Diabetes Neg Hx    Alzheimer's disease Neg Hx    Parkinson's disease Neg Hx    Esophageal cancer Neg Hx    Stomach cancer Neg Hx     Allergies  Allergen Reactions   Singulair [Montelukast] Other (See Comments)    Mood change    Current Outpatient Medications on File Prior to Visit  Medication Sig Dispense Refill   acetaminophen (TYLENOL) 325 MG tablet Take 2 tablets (650 mg total) by mouth every 6 (six) hours as needed for mild pain (or Fever >/= 101).     albuterol (VENTOLIN HFA) 108 (90 Base) MCG/ACT inhaler TAKE 2 PUFFS BY MOUTH EVERY 6 HOURS AS NEEDED FOR WHEEZE OR SHORTNESS OF BREATH 6.7 each 1   amLODipine (NORVASC) 5 MG tablet Take 1 tablet (5 mg total) by mouth daily. 90 tablet 3   cephALEXin (KEFLEX) 500 MG capsule Take 1 capsule (500 mg total) by mouth 4 (four) times daily for 7 days. 28 capsule 0   cetirizine (ZYRTEC) 10 MG tablet Take 10 mg by mouth daily as needed for allergies.     Cholecalciferol (VITAMIN D3) 250 MCG (10000 UT) capsule Take 10,000 Units by mouth daily.     colchicine 0.6 MG tablet Take 1 tablet (0.6 mg total) by mouth 2 (two) times  daily. 14 tablet 0   colesevelam (WELCHOL) 625 MG tablet Take 1 tablet (625 mg total) by mouth  2 (two) times daily with a meal. 60 tablet 5   doxycycline (VIBRA-TABS) 100 MG tablet Take 1 tablet (100 mg total) by mouth 2 (two) times daily. Can give capsules or generic, 20 tablet 0   fluorouracil (EFUDEX) 5 % cream 2 (two) times daily as needed.     fluticasone (FLONASE) 50 MCG/ACT nasal spray SPRAY 2 SPRAYS INTO EACH NOSTRIL EVERY DAY 16 mL 1   gabapentin (NEURONTIN) 300 MG capsule Take 300 mg by mouth 2 (two) times daily.     ipratropium (ATROVENT) 0.03 % nasal spray Place 1-2 sprays into both nostrils 2 (two) times daily as needed (nasal drainage). 30 mL 5   lenalidomide (REVLIMID) 5 MG capsule TAKE 1 CAPSULE BY MOUTH DAILY  FOR 21 DAYS, THEN 7 DAYS OFF 21 capsule 0   lidocaine (LIDODERM) 5 % Place 1 patch onto the skin daily as needed (pain).  2   Loperamide HCl (IMODIUM PO) Take by mouth daily.     SYMBICORT 160-4.5 MCG/ACT inhaler TAKE 2 PUFFS BY MOUTH TWICE A DAY 10.2 each 0   valsartan (DIOVAN) 160 MG tablet TAKE 1 TABLET BY MOUTH EVERY DAY 30 tablet 3   XARELTO 10 MG TABS tablet TAKE 1 TABLET BY MOUTH EVERY DAY 90 tablet 3   zolpidem (AMBIEN) 5 MG tablet Take 1 tablet (5 mg total) by mouth at bedtime as needed for sleep. 30 tablet 1   Current Facility-Administered Medications on File Prior to Visit  Medication Dose Route Frequency Provider Last Rate Last Admin   0.9 %  sodium chloride infusion   Intravenous PRN Dolph Tavano, Percell Miller, PA-C       heparin lock flush 100 unit/mL  500 Units Intravenous Once Celso Amy, NP       sodium chloride flush (NS) 0.9 % injection 3 mL  3 mL Intravenous Once PRN Celso Amy, NP        BP 118/78   Pulse 60   Resp 18   Ht _0  (1.702 m)   Wt 185 lb (83.9 kg)   SpO2 97%   BMI 28.98 kg/m        Objective:   Physical Exam  General- No acute distress. Pleasant patient. Neck- Full range of motion, no jvd Lungs- Clear, even and unlabored. Heart- regular rate and rhythm. Neurologic- CNII- XII grossly intact.  Left foot- tender  swollen left great toe. Redness extend to distal 1st, 2nd and 3rd metatarsal. Also on lateral view medial aspect left great is swollen. No alread of fluctuance or breakdown.  Patient has a little bit more tenderness above the area that I marked more in the mid metatarsal region.     Assessment & Plan:   Patient Instructions  Left foot pain with presentation of gout flare and cellulitis.  Patient is having persistent and  worsening symptoms despite increasing colchicine to twice daily and changing antibiotics from Keflex to doxycycline.  History of cellulitis that required hospitalization in the same foot back in 2018.  Since not improving think is best to be evaluated emergency department for repeat labs.  Possible IV antibiotics needed and may be admission needed?.  I think repeating labs and possibly imaging studies would be helpful.   I did call downstairs to talk to the ED physician and explained recent presentation.  Follow-up date to be determined after ED evaluation.  Mackie Pai, PA-C

## 2022-02-12 NOTE — ED Notes (Signed)
Verbal report given to [facility] and message sent to floor nurse

## 2022-02-12 NOTE — Plan of Care (Signed)

## 2022-02-12 NOTE — Patient Instructions (Addendum)
Left foot pain with presentation of gout flare and cellulitis.  Patient is having persistent and  worsening symptoms despite increasing colchicine to twice daily and changing antibiotics from Keflex to doxycycline.  History of cellulitis that required hospitalization in the same foot back in 2018.  Since not improving think is best to be evaluated emergency department for repeat labs.  Possible IV antibiotics needed and may be admission needed?.  I think repeating labs and possibly imaging studies would be helpful.   I did call downstairs to talk to the ED physician and explained recent presentation.  Follow-up date to be determined after ED evaluation.

## 2022-02-12 NOTE — ED Notes (Signed)
Patient transported to CT 

## 2022-02-12 NOTE — ED Notes (Signed)
Spoke with Maggie in lab to add sedimentation rate and C-reactive protein on to previously collected bloodwork.

## 2022-02-12 NOTE — ED Notes (Signed)
[facility] into transport 

## 2022-02-12 NOTE — ED Notes (Signed)
ED TO INPATIENT HANDOFF REPORT  ED Nurse Name and Phone #: Renaldo Reel Name/Age/Gender Jorge [name] 66 y.o. male Room/Bed: MH04/MH04  Code Status   Code Status: Prior  Home/SNF/Other Home Patient oriented to: self, place, time, and situation Is this baseline? Yes   Triage Complete: Triage complete  Chief Complaint Cellulitis of left foot [K53.976]  Triage Note Redness, pain & swelling to left foot 7/27, seen [place], [place] Monday. Started on antibiotics, taking doxycycline.    Allergies Allergies  Allergen Reactions   Singulair [Montelukast] Other (See Comments)    Mood change    Level of Care/Admitting Diagnosis ED Disposition     ED Disposition  Admit   Condition  --   Comment  Hospital Area: [place] [[id number]]  Level of Care: Med-Surg [16]  Interfacility transfer: Yes  May place patient in observation at [facility] or [place] if equivalent level of care is available:: No  Covid Evaluation: Asymptomatic - no recent exposure (last 10 days) testing not required  Diagnosis: Cellulitis of left foot [734193]  Admitting Physician: Jonnie Finner [[id number]]  Attending Physician: Regan Lemming [phone]          B Medical/Surgery History Past Medical History:  Diagnosis Date   Allergy    Anxiety    Asthma    Blood transfusion without reported diagnosis 2018   Bone metastasis    Chest cold 05/19/2016   productive cough  -- started on antibiotic   Chronic back pain    due to bone mets from myeloma   Cough    Depression    Diverticulitis    GERD (gastroesophageal reflux disease)    Hiatal hernia    History of chicken pox    History of concussion    age 41 -- no residual   History of DVT of lower extremity 03/21/2016  treated and completed w/ xarelto   per doppler left extensive occlusion common femoral, femoral, and popliteal veins and right partial occlusion common femoral and profunda femoral veins/  last doppler  06-04-2016 no evidence acute or chronic dvt noted either leg    History of radiation therapy 06/09/16-06/23/16   lower thoracic spine 25 Gy in 10 fractions   Mouth ulcers    secondary to radiation   Multiple myeloma ([place]) dx 02/22/2016 via bone marrow bx---  oncologist-  [name]   IgG Kappa-- Hyperdiploid/ +11 w/ bone mets--  current treatment chemotherapy (started 08/ 2017)and pallitive radiation to back started 06-09-2016   Renal calculus, right    Wears contact lenses    Past Surgical History:  Procedure Laterality Date   COLONOSCOPY  2008,2013   COLONOSCOPY  01/07/2017   Dr Penelope Coop, [facility]   CYSTOSCOPY W/ URETERAL STENT PLACEMENT Right 06/20/2016   Procedure: CYSTOSCOPY WITH STENT REPLACEMENT;  Surgeon: Kathie Rhodes, MD;  Location: [facility];  Service: Urology;  Laterality: Right;   CYSTOSCOPY WITH RETROGRADE PYELOGRAM, URETEROSCOPY AND STENT PLACEMENT Right 05/30/2016   Procedure: CYSTOSCOPY WITH RETROGRADE PYELOGRAM, URETEROSCOPY AND STENT PLACEMENT,DILITATION URETERAL STRICTURE;  Surgeon: Kathie Rhodes, MD;  Location: [facility];  Service: Urology;  Laterality: Right;   CYSTOSCOPY/RETROGRADE/URETEROSCOPY/STONE EXTRACTION WITH BASKET Right 06/20/2016   Procedure: CYSTOSCOPY/URETEROSCOPY/STONE EXTRACTION WITH BASKET;  Surgeon: Kathie Rhodes, MD;  Location: [facility];  Service: Urology;  Laterality: Right;   HIATAL HERNIA REPAIR N/A 08/13/2018   Procedure: LAPAROSCOPIC REPAIR OF HIATAL HERNIA REPAIR WITH FUNDOPLICATION AND INSERTION OF MESH;  Surgeon: Excell Seltzer, MD;  Location: [facility];  Service: General;  Laterality: N/A;   HOLMIUM LASER APPLICATION Right 38/10/6657   Procedure: HOLMIUM LASER APPLICATION;  Surgeon: Kathie Rhodes, MD;  Location: [place];  Service: Urology;  Laterality: Right;   IR GENERIC HISTORICAL  02/11/2016   IR RADIOLOGIST EVAL & MGMT 02/11/2016 [facility]   IR GENERIC HISTORICAL  02/15/2016   IR BONE TUMOR(S)RF  ABLATION 02/15/2016 Luanne Bras, MD [facility]   IR GENERIC HISTORICAL  02/15/2016   IR BONE TUMOR(S)RF ABLATION 02/15/2016 Luanne Bras, MD [facility]   IR GENERIC HISTORICAL  02/15/2016   IR BONE TUMOR(S)RF ABLATION 02/15/2016 Luanne Bras, MD [facility]   IR GENERIC HISTORICAL  02/15/2016   IR KYPHO THORACIC WITH BONE BIOPSY 02/15/2016 Luanne Bras, MD [facility]   IR GENERIC HISTORICAL  02/15/2016   IR KYPHO THORACIC WITH BONE BIOPSY 02/15/2016 Luanne Bras, MD [facility]   IR GENERIC HISTORICAL  02/15/2016   IR VERTEBROPLASTY CERV/THOR BX INC UNI/BIL INC/INJECT/IMAGING 02/15/2016 Luanne Bras, MD [facility]   IR GENERIC HISTORICAL  03/13/2016   IR KYPHO EA ADDL LEVEL THORACIC OR LUMBAR 03/13/2016 Luanne Bras, MD [facility]   IR GENERIC HISTORICAL  03/13/2016   IR KYPHO EA ADDL LEVEL THORACIC OR LUMBAR 03/13/2016 Luanne Bras, MD [facility]   IR GENERIC HISTORICAL  03/13/2016   IR BONE TUMOR(S)RF ABLATION 03/13/2016 Luanne Bras, MD [facility]   IR GENERIC HISTORICAL  03/13/2016   IR KYPHO LUMBAR INC FX REDUCE BONE BX UNI/BIL CANNULATION INC/IMAGING 03/13/2016 Luanne Bras, MD [facility]   IR GENERIC HISTORICAL  03/13/2016   IR BONE TUMOR(S)RF ABLATION 03/13/2016 Luanne Bras, MD [facility]   IR GENERIC HISTORICAL  03/13/2016   IR BONE TUMOR(S)RF ABLATION 03/13/2016 Luanne Bras, MD [facility]   IR GENERIC HISTORICAL  03/31/2016   IR RADIOLOGIST EVAL & MGMT 03/31/2016 [facility]   KYPHOPLASTY     02/2016, 03/2016, 11/2016   LAPAROSCOPIC INGUINAL HERNIA REPAIR Bilateral 12-16-2013  dr gross   RADIOLOGY WITH ANESTHESIA N/A 02/15/2016   Procedure: Spinal Ablation;  Surgeon: Luanne Bras, MD;  Location: [place];  Service: Radiology;  Laterality: N/A;   RADIOLOGY WITH ANESTHESIA N/A 03/13/2016   Procedure: LUMBER ABLATION;  Surgeon: Luanne Bras, MD;  Location: [place];  Service: Radiology;  Laterality: N/A;   ROTATOR  CUFF REPAIR Right 2003   spine operation     x1 in 2018 x3 in 2017   TONSILLECTOMY  age 73   VERTEBROPLASTY  01/2016   WISDOM TOOTH EXTRACTION       A IV Location/Drains/Wounds Patient Lines/Drains/Airways Status     Active Line/Drains/Airways     Name Placement date Placement time Site Days   Peripheral IV 02/12/22 20 G Right Antecubital 02/12/22  1059  Antecubital  less than 1   Incision - 6 Ports Abdomen 1: Right;Lateral;Distal 2: Right;Lateral 3: Umbilicus 4: Left;Lateral 5: Left;Lateral;Distal 6: Umbilicus;Upper 08/13/18  0802  -- 1279            Intake/Output Last 24 hours  Intake/Output Summary (Last 24 hours) at 02/12/2022 1259 Last data filed at 02/12/2022 1158 Gross per 24 hour  Intake 1100.03 ml  Output --  Net 1100.03 ml    Labs/Imaging Results for orders placed or performed during the hospital encounter of 02/12/22 (from the past 48 hour(s))  Comprehensive metabolic panel     Status: Abnormal   Collection Time: 02/12/22 10:38 AM  Result Value Ref Range   Sodium  138 135 - 145 mmol/L   Potassium 4.2 3.5 - 5.1 mmol/L   Chloride 109 98 - 111 mmol/L   CO2 26 22 - 32 mmol/L   Glucose, Bld 84 70 - 99 mg/dL    Comment: Glucose reference range applies only to samples taken after fasting for at least 8 hours.   BUN 14 8 - 23 mg/dL   Creatinine, Ser 0.95 0.61 - 1.24 mg/dL   Calcium 8.9 8.9 - 10.3 mg/dL   Total Protein 7.4 6.5 - 8.1 g/dL   Albumin 3.9 3.5 - 5.0 g/dL   AST 34 15 - 41 U/L   ALT 35 0 - 44 U/L   Alkaline Phosphatase 50 38 - 126 U/L   Total Bilirubin 1.0 0.3 - 1.2 mg/dL   GFR, Estimated >60 >60 mL/min    Comment: (NOTE) Calculated using the CKD-EPI Creatinine Equation (2021)    Anion gap 3 (L) 5 - 15    Comment: Performed at [facility], [address] [place], [place] [address]  CBC with Differential     Status: Abnormal   Collection Time: 02/12/22 10:38 AM  Result Value Ref Range   WBC 4.6 4.0 - 10.5 K/uL   RBC 4.38 4.22 - 5.81  MIL/uL   Hemoglobin 15.3 13.0 - 17.0 g/dL   HCT 45.0 39.0 - 52.0 %   MCV 102.7 (H) 80.0 - 100.0 fL   MCH 34.9 (H) 26.0 - 34.0 pg   MCHC 34.0 30.0 - 36.0 g/dL   RDW 13.9 11.5 - 15.5 %   Platelets 144 (L) 150 - 400 K/uL   nRBC 0.0 0.0 - 0.2 %   Neutrophils Relative % 41 %   Neutro Abs 1.9 1.7 - 7.7 K/uL   Lymphocytes Relative 43 %   Lymphs Abs 2.0 0.7 - 4.0 K/uL   Monocytes Relative 14 %   Monocytes Absolute 0.7 0.1 - 1.0 K/uL   Eosinophils Relative 1 %   Eosinophils Absolute 0.1 0.0 - 0.5 K/uL   Basophils Relative 1 %   Basophils Absolute 0.1 0.0 - 0.1 K/uL   Immature Granulocytes 0 %   Abs Immature Granulocytes 0.00 0.00 - 0.07 K/uL    Comment: Performed at [facility], [address] [place], [place] [address]  Lactic acid     Status: None   Collection Time: 02/12/22 10:41 AM  Result Value Ref Range   Lactic Acid, Venous 1.3 0.5 - 1.9 mmol/L    Comment: Performed at [facility], [address] [place], [place] [address]   *Note: Due to a large number of results and/or encounters for the requested time period, some results have not been displayed. A complete set of results can be found in Results Review.   DG Foot Complete Left  Result Date: 02/12/2022 CLINICAL DATA:  Left foot redness, pain, and swelling 02/06/2022. EXAM: LEFT FOOT - COMPLETE 3+ VIEW COMPARISON:  Left foot radiographs 02/10/2022 FINDINGS: Mild dorsal talonavicular and tarsometatarsal degenerative osteophytosis on lateral view. Unchanged probable mild-to-moderate soft tissue swelling of the forefoot and toes. This appears to be greatest around the great toe metatarsophalangeal joint. Minimal great toe metatarsophalangeal joint space narrowing and lateral greater than medial degenerative osteophytosis. No cortical erosion or periostitis. IMPRESSION: 1. Soft tissue swelling of the forefoot greatest around the great toe metatarsophalangeal joint is similar to prior. 2. Minimal great toe  metatarsophalangeal joint osteoarthritis. 3. No radiographic evidence of inflammatory arthropathy such as  gout. Electronically Signed   By: Yvonne Kendall M.D.   On: 02/12/2022 10:57    Pending Labs Unresulted Labs (From admission, onward)     Start     Ordered   02/12/22 1210  Sedimentation rate  Once,   URGENT        02/12/22 1210   02/12/22 1210  C-reactive protein  Once,   URGENT        02/12/22 1210   02/12/22 1013  Blood Cultures x 2 sites  BLOOD CULTURE X 2,   STAT      02/12/22 1019            Vitals/Pain Today's Vitals   02/12/22 1155 02/12/22 1230 02/12/22 1245 02/12/22 1253  BP:  127/86 122/83   Pulse:  (!) 57 (!) 54   Resp:  13 16   Temp:   (!) 97.5 F (36.4 C)   SpO2:  96% 96%   PainSc: 3    3     Isolation Precautions No active isolations  Medications Medications  sodium chloride 0.9 % bolus 1,000 mL (0 mLs Intravenous Stopped 02/12/22 1158)    And  0.9 %  sodium chloride infusion ( Intravenous New Bag/Given 02/12/22 1253)  HYDROcodone-acetaminophen (NORCO/VICODIN) 5-325 MG per tablet 1-2 tablet (1 tablet Oral Given 02/12/22 1115)  vancomycin (VANCOCIN) IVPB 1000 mg/200 mL premix (has no administration in time range)  ceFEPIme (MAXIPIME) 2 g in sodium chloride 0.9 % 100 mL IVPB (has no administration in time range)  ceFEPIme (MAXIPIME) 2 g in sodium chloride 0.9 % 100 mL IVPB (0 g Intravenous Stopped 02/12/22 1152)  vancomycin (VANCOCIN) IVPB 1000 mg/200 mL premix (1,000 mg Intravenous New Bag/Given 02/12/22 1152)    And  vancomycin (VANCOCIN) 500 mg in sodium chloride 0.9 % 100 mL IVPB (500 mg Intravenous New Bag/Given 02/12/22 1153)    Mobility walks Low fall risk   Focused Assessments     R Recommendations: See Admitting Provider Note  Report given to:   Additional Notes:

## 2022-02-13 DIAGNOSIS — Z79899 Other long term (current) drug therapy: Secondary | ICD-10-CM | POA: Diagnosis not present

## 2022-02-13 DIAGNOSIS — I82503 Chronic embolism and thrombosis of unspecified deep veins of lower extremity, bilateral: Secondary | ICD-10-CM | POA: Diagnosis present

## 2022-02-13 DIAGNOSIS — D696 Thrombocytopenia, unspecified: Secondary | ICD-10-CM | POA: Diagnosis present

## 2022-02-13 DIAGNOSIS — C9000 Multiple myeloma not having achieved remission: Secondary | ICD-10-CM | POA: Diagnosis present

## 2022-02-13 DIAGNOSIS — Z923 Personal history of irradiation: Secondary | ICD-10-CM | POA: Diagnosis not present

## 2022-02-13 DIAGNOSIS — Z9221 Personal history of antineoplastic chemotherapy: Secondary | ICD-10-CM | POA: Diagnosis not present

## 2022-02-13 DIAGNOSIS — M109 Gout, unspecified: Secondary | ICD-10-CM | POA: Diagnosis present

## 2022-02-13 DIAGNOSIS — M549 Dorsalgia, unspecified: Secondary | ICD-10-CM | POA: Diagnosis present

## 2022-02-13 DIAGNOSIS — Z87891 Personal history of nicotine dependence: Secondary | ICD-10-CM | POA: Diagnosis not present

## 2022-02-13 DIAGNOSIS — Z9484 Stem cells transplant status: Secondary | ICD-10-CM | POA: Diagnosis not present

## 2022-02-13 DIAGNOSIS — Z7901 Long term (current) use of anticoagulants: Secondary | ICD-10-CM | POA: Diagnosis not present

## 2022-02-13 DIAGNOSIS — R053 Chronic cough: Secondary | ICD-10-CM | POA: Diagnosis present

## 2022-02-13 DIAGNOSIS — L03116 Cellulitis of left lower limb: Secondary | ICD-10-CM | POA: Diagnosis present

## 2022-02-13 DIAGNOSIS — F419 Anxiety disorder, unspecified: Secondary | ICD-10-CM | POA: Diagnosis present

## 2022-02-13 DIAGNOSIS — K219 Gastro-esophageal reflux disease without esophagitis: Secondary | ICD-10-CM | POA: Diagnosis present

## 2022-02-13 DIAGNOSIS — Z888 Allergy status to other drugs, medicaments and biological substances status: Secondary | ICD-10-CM | POA: Diagnosis not present

## 2022-02-13 DIAGNOSIS — G8929 Other chronic pain: Secondary | ICD-10-CM | POA: Diagnosis present

## 2022-02-13 DIAGNOSIS — F32A Depression, unspecified: Secondary | ICD-10-CM | POA: Diagnosis present

## 2022-02-13 DIAGNOSIS — I10 Essential (primary) hypertension: Secondary | ICD-10-CM | POA: Diagnosis present

## 2022-02-13 LAB — COMPREHENSIVE METABOLIC PANEL
ALT: 27 U/L (ref 0–44)
AST: 23 U/L (ref 15–41)
Albumin: 3 g/dL — ABNORMAL LOW (ref 3.5–5.0)
Alkaline Phosphatase: 39 U/L (ref 38–126)
Anion gap: 4 — ABNORMAL LOW (ref 5–15)
BUN: 13 mg/dL (ref 8–23)
CO2: 21 mmol/L — ABNORMAL LOW (ref 22–32)
Calcium: 8 mg/dL — ABNORMAL LOW (ref 8.9–10.3)
Chloride: 116 mmol/L — ABNORMAL HIGH (ref 98–111)
Creatinine, Ser: 0.73 mg/dL (ref 0.61–1.24)
GFR, Estimated: >60 mL/min (ref >60)
Glucose, Bld: 93 mg/dL (ref 70–99)
Potassium: 3.9 mmol/L (ref 3.5–5.1)
Sodium: 141 mmol/L (ref 135–145)
Total Bilirubin: 0.9 mg/dL (ref 0.3–1.2)
Total Protein: 5.9 g/dL — ABNORMAL LOW (ref 6.5–8.1)

## 2022-02-13 LAB — CBC
HCT: 39 % (ref 39.0–52.0)
Hemoglobin: 13.5 g/dL (ref 13.0–17.0)
MCH: 35.4 pg — ABNORMAL HIGH (ref 26.0–34.0)
MCHC: 34.6 g/dL (ref 30.0–36.0)
MCV: 102.4 fL — ABNORMAL HIGH (ref 80.0–100.0)
Platelets: 111 10*3/uL — ABNORMAL LOW (ref 150–400)
RBC: 3.81 MIL/uL — ABNORMAL LOW (ref 4.22–5.81)
RDW: 13.6 % (ref 11.5–15.5)
WBC: 3.7 10*3/uL — ABNORMAL LOW (ref 4.0–10.5)
nRBC: 0 % (ref 0.0–0.2)

## 2022-02-13 MED ORDER — FLUTICASONE PROPIONATE 50 MCG/ACT NA SUSP
1.0000 | Freq: Every day | NASAL | Status: DC
  Administered 2022-02-13 – 2022-02-14 (×2): 1 via NASAL
  Filled 2022-02-13: qty 16

## 2022-02-13 MED ORDER — IPRATROPIUM BROMIDE 0.03 % NA SOLN
1.0000 | Freq: Two times a day (BID) | NASAL | Status: DC
Start: 2022-02-13 — End: 2022-02-15
  Administered 2022-02-13 – 2022-02-14 (×2): 1 via NASAL
  Filled 2022-02-13: qty 30

## 2022-02-13 MED ORDER — STERILE WATER FOR INJECTION IJ SOLN
INTRAMUSCULAR | Status: AC
  Administered 2022-02-13: 2 mL
  Filled 2022-02-13: qty 10

## 2022-02-13 MED ORDER — ALBUTEROL SULFATE (2.5 MG/3ML) 0.083% IN NEBU: 2.5000 mg | INHALATION_SOLUTION | Freq: Four times a day (QID) | RESPIRATORY_TRACT | Status: DC | PRN

## 2022-02-13 MED ORDER — METHYLPREDNISOLONE SODIUM SUCC 125 MG IJ SOLR
125.0000 mg | Freq: Once | INTRAMUSCULAR | Status: AC
  Administered 2022-02-13: 125 mg via INTRAVENOUS
  Filled 2022-02-13: qty 2

## 2022-02-13 MED ORDER — AMLODIPINE BESYLATE 5 MG PO TABS
5.0000 mg | ORAL_TABLET | Freq: Every day | ORAL | Status: DC
  Administered 2022-02-13: 5 mg via ORAL
  Filled 2022-02-13 (×2): qty 1

## 2022-02-13 NOTE — Progress Notes (Signed)
  Transition of Care [facility]) Screening Note   Patient Details  Name: Mick Tanguma Date of Birth: 21-Aug-1955   Transition of Care [facility]) CM/SW Contact:    Vassie Moselle, LCSW Phone Number: 02/13/2022, 10:56 AM    Transition of Care Department [facility]) has reviewed patient and no TOC needs have been identified at this time. We will continue to monitor patient advancement through interdisciplinary progression rounds. If new patient transition needs arise, please place a TOC consult.

## 2022-02-13 NOTE — Progress Notes (Signed)
PROGRESS NOTE  Jorge [name] JXB:[id number] DOB: 19-Mar-1956 DOA: 02/12/2022 PCP: Mackie Pai, PA-C   LOS: 0 days   Brief Narrative / Interim history: 66 year old male with HTN, multiple myeloma followed by Dr. Marin Olp, comes into the hospital with a week of left foot pain.  Initially started at the base of the left great toe.  It reminded him of her prior episode of cellulitis on his right foot was about 5 years ago.  No fevers at home.  He initially came to the ED, took Keflex and colchicine, did not improve and saw his PCP 2 days prior to admission.  Colchicine was increased, he was also given Rocephin and changed to doxycycline.  Again did not improve and came back to the hospital.  Subjective / 24h Interval events: Doing well this morning.  Feels like the redness has receded a little bit this morning and pain may be better.  Assesement and Plan: Principal Problem:   Cellulitis of left foot Active Problems:   Multiple myeloma (HCC)   Thrombocytopenia (HCC)   Chronic cough   History of DVT (deep vein thrombosis)   HTN (hypertension)   Principal problem Left foot pain, gout attack versus cellulitis-given lack of improvement with antibiotics, negative procalcitonin this is probably gouty attack but cannot fully rule out an infectious component.  Dr. Marin Olp saw the patient earlier this morning and gave him high-dose Solu-Medrol.  For now keep on antibiotics, monitor cultures.  If remains afebrile and Solu-Medrol helps will be converted to prednisone tomorrow assuming cultures remain negative  Active problems Multiple myeloma-managed as an outpatient by Dr. Marin Olp  History of DVT-continue home Xarelto  Chronic thrombocytopenia-stable, monitor  Essential hypertension-resume home amlodipine  Scheduled Meds:  colchicine  0.6 mg Oral BID   rivaroxaban  10 mg Oral Daily   Continuous Infusions:  sodium chloride 125 mL/hr at 02/13/22 0610   ceFEPime (MAXIPIME) IV 2 g (02/13/22  0825)   vancomycin 1,000 mg (02/12/22 2355)   PRN Meds:.acetaminophen **OR** acetaminophen, HYDROcodone-acetaminophen, ondansetron **OR** ondansetron (ZOFRAN) IV, mouth rinse, zolpidem  Diet Orders (From admission, onward)     Start     Ordered   02/12/22 1513  Diet regular Room service appropriate? Yes; Fluid consistency: Thin  Diet effective now       Question Answer Comment  Room service appropriate? Yes   Fluid consistency: Thin      02/12/22 1512            DVT prophylaxis: rivaroxaban (XARELTO) tablet 10 mg Start: 02/12/22 2145 rivaroxaban (XARELTO) tablet 10 mg   Lab Results  Component Value Date   PLT 111 (L) 02/13/2022      Code Status: Partial Code  Family Communication: no family at bedside   Status is: Observation  The patient will require care spanning > 2 midnights and should be moved to inpatient because: IV antibiotics, culture monitoring   Level of care: Med-Surg  Consultants:  Oncology    Objective: Vitals:   02/12/22 1406 02/12/22 1408 02/12/22 2238 02/13/22 0455  BP: 131/84  109/75 110/73  Pulse: (!) 51  67 63  Resp: (!) '22  17 18  ' Temp: 97.9 F (36.6 C)  98 F (36.7 C) 98.1 F (36.7 C)  SpO2: 99%  94% 96%  Weight:  83.9 kg    Height:  '5\' 7"'  (1.702 m)      Intake/Output Summary (Last 24 hours) at 02/13/2022 1034 Last data filed at 02/12/2022 2041 Gross per 24 hour  Intake  1340.03 ml  Output --  Net 1340.03 ml   Wt Readings from Last 3 Encounters:  02/12/22 83.9 kg  02/12/22 83.9 kg  02/10/22 83.9 kg    Examination:  Constitutional: NAD Eyes: no scleral icterus ENMT: Mucous membranes are moist.  Neck: normal, supple Respiratory: clear to auscultation bilaterally, no wheezing, no crackles. Normal respiratory effort. No accessory muscle use.  Cardiovascular: Regular rate and rhythm, no murmurs / rubs / gallops.  Abdomen: non distended, no tenderness. Bowel sounds positive.  Musculoskeletal: no clubbing / cyanosis.  Skin:  Left great toe swollen, red, tender to palpation, with surrounding cellulitis up to midfoot  Data Reviewed: I have independently reviewed following labs and imaging studies   CBC Recent Labs  Lab 02/07/22 0915 02/10/22 1011 02/12/22 1038 02/13/22 0453  WBC 4.7 5.2 4.6 3.7*  HGB 15.2 15.7 15.3 13.5  HCT 44.9 46.5 45.0 39.0  PLT 139* 135.0* 144* 111*  MCV 103.2* 104.1* 102.7* 102.4*  MCH 34.9*  --  34.9* 35.4*  MCHC 33.9 33.6 34.0 34.6  RDW 14.3 15.3 13.9 13.6  LYMPHSABS 1.8 2.1 2.0  --   MONOABS 0.6 0.6 0.7  --   EOSABS 0.2 0.1 0.1  --   BASOSABS 0.1 0.0 0.1  --     Recent Labs  Lab 02/07/22 0915 02/12/22 1038 02/12/22 1041 02/12/22 1900 02/13/22 0453  NA 141 138  --   --  141  K 4.8 4.2  --   --  3.9  CL 109 109  --   --  116*  CO2 27 26  --   --  21*  GLUCOSE 97 84  --   --  93  BUN 15 14  --   --  13  CREATININE 0.98 0.95  --   --  0.73  CALCIUM 8.8* 8.9  --   --  8.0*  AST  --  34  --   --  23  ALT  --  35  --   --  27  ALKPHOS  --  50  --   --  39  BILITOT  --  1.0  --   --  0.9  ALBUMIN  --  3.9  --   --  3.0*  CRP  --  2.5*  --   --   --   PROCALCITON  --   --   --  <0.10  --   LATICACIDVEN  --   --  1.3  --   --     ------------------------------------------------------------------------------------------------------------------ No results for input(s): "CHOL", "HDL", "LDLCALC", "TRIG", "CHOLHDL", "LDLDIRECT" in the last 72 hours.  Lab Results  Component Value Date   HGBA1C 5.1 05/14/2016   ------------------------------------------------------------------------------------------------------------------ No results for input(s): "TSH", "T4TOTAL", "T3FREE", "THYROIDAB" in the last 72 hours.  Invalid input(s): "FREET3"  Cardiac Enzymes No results for input(s): "CKMB", "TROPONINI", "MYOGLOBIN" in the last 168 hours.  Invalid input(s):  "CK" ------------------------------------------------------------------------------------------------------------------    Component Value Date/Time   BNP 99.6 02/03/2018 0542    CBG: No results for input(s): "GLUCAP" in the last 168 hours.  Recent Results (from the past 240 hour(s))  Blood Cultures x 2 sites     Status: None (Preliminary result)   Collection Time: 02/12/22 10:40 AM   Specimen: BLOOD  Result Value Ref Range Status   Specimen Description BLOOD RIGHT ANTECUBITAL  Final   Special Requests   Final    BOTTLES DRAWN AEROBIC AND ANAEROBIC Blood Culture adequate volume Performed at  [facility], [place] [address] [place], [place] [address]    Culture PENDING  Incomplete   Report Status PENDING  Incomplete  Blood Cultures x 2 sites     Status: None (Preliminary result)   Collection Time: 02/12/22 11:00 AM   Specimen: BLOOD  Result Value Ref Range Status   Specimen Description BLOOD LEFT ANTECUBITAL  Final   Special Requests   Final    BOTTLES DRAWN AEROBIC AND ANAEROBIC Blood Culture adequate volume Performed at [facility], [place] [address], [place],  [address]    Culture PENDING  Incomplete   Report Status PENDING  Incomplete     Radiology Studies: DG Foot Complete Left  Result Date: 02/12/2022 CLINICAL DATA:  Left foot redness, pain, and swelling 02/06/2022. EXAM: LEFT FOOT - COMPLETE 3+ VIEW COMPARISON:  Left foot radiographs 02/10/2022 FINDINGS: Mild dorsal talonavicular and tarsometatarsal degenerative osteophytosis on lateral view. Unchanged probable mild-to-moderate soft tissue swelling of the forefoot and toes. This appears to be greatest around the great toe metatarsophalangeal joint. Minimal great toe metatarsophalangeal joint space narrowing and lateral greater than medial degenerative osteophytosis. No cortical erosion or periostitis. IMPRESSION: 1. Soft tissue swelling of the forefoot greatest around the great toe metatarsophalangeal joint is  similar to prior. 2. Minimal great toe metatarsophalangeal joint osteoarthritis. 3. No radiographic evidence of inflammatory arthropathy such as gout. Electronically Signed   By: Yvonne Kendall M.D.   On: 02/12/2022 10:57     Marzetta Board, MD, PhD [facility]  Between 7 am - 7 pm I am available, please contact me via Amion (for emergencies) or Securechat (non urgent messages)  Between 7 pm - 7 am I am not available, please contact night coverage MD/APP via Amion

## 2022-02-13 NOTE — Consult Note (Signed)
Jorge [name] is well-known to me.  He is very nice 66 year old white male.  He has a history of IgG kappa myeloma.  He has undergone a stem cell transplant.  This was several years ago.  He currently is on maintenance therapy with low-dose Revlimid.  He is doing well with the Revlimid.  He has occasional episodes of iron deficiency.  He has had little bit of diarrhea.  Last time I saw him was back in May.  He received Zometa when we saw him back.  He and his wife will be going to [place] on a cruise in about a week or so.  I think this is for his birthday.  So far his myeloma has been in remission.  He began having some pain in the left foot.  This is in the big toe.  He had was red and swollen.  He went to the emergency room.  He was found to have was felt to be gout.  He has some cellulitis.  He was subsequently admitted.  He was admitted on 02/12/2022.  Has been started on antibiotics.  Is on Maxipime and vancomycin.  He has had cultures taken.  On 02/12/2022, his white cell count is 4.6.  Hemoglobin 15.3.  Platelet count 144,000.  His uric acid was 7.8 on 02/10/2022.  On 02/12/2022, uric acid was 6.5.  He is on colchicine right now.  Still of swelling erythema associated with the foot in hallux.  I think that some steroids sort would not be a bad idea.  Steroids would not affect his immune system.  He has had no fever.  He has had no cough or shortness of breath.  There is been no nausea or vomiting.  There has been no rashes.  He has had no issues with COVID.  He has been pretty active.  He has had no recent travel.  He is eating well.  He watches what he eats.  His vital signs show temperature 98.1.  Pulse 63.  Blood pressure 110/73.  His head neck exam shows no ocular or oral lesions.  He has no adenopathy in the neck.  Lungs are clear bilaterally.  Cardiac exam regular rate and rhythm.  Abdomen is soft.  Bowel sounds are present.  He has no fluid wave.  There is no palpable liver or spleen  tip extremities does show swelling with the left foot.  Has little bit of erythema at the MP joint.  There is tenderness to palpation.  He does have good pulses.  Mr. Amsden has gout.  I suspect his back and need to be on allopurinol long-term.  His uric acid was on the higher side.  I really do not think this is from the Revlimid.  Again I will put him on some steroid right now.  I given a dose of Solu-Medrol.  We can then get him on a steroid taper.  I do appreciate the great care that he is getting from the wonderful staff on [address].  Lattie Haw, MD  [place] 14:14

## 2022-02-14 DIAGNOSIS — L03116 Cellulitis of left lower limb: Secondary | ICD-10-CM | POA: Diagnosis not present

## 2022-02-14 LAB — CBC
HCT: 40.5 % (ref 39.0–52.0)
Hemoglobin: 13.9 g/dL (ref 13.0–17.0)
MCH: 35.5 pg — ABNORMAL HIGH (ref 26.0–34.0)
MCHC: 34.3 g/dL (ref 30.0–36.0)
MCV: 103.3 fL — ABNORMAL HIGH (ref 80.0–100.0)
Platelets: 143 10*3/uL — ABNORMAL LOW (ref 150–400)
RBC: 3.92 MIL/uL — ABNORMAL LOW (ref 4.22–5.81)
RDW: 13.9 % (ref 11.5–15.5)
WBC: 6.2 10*3/uL (ref 4.0–10.5)
nRBC: 0 % (ref 0.0–0.2)

## 2022-02-14 LAB — BASIC METABOLIC PANEL
Anion gap: 3 — ABNORMAL LOW (ref 5–15)
BUN: 11 mg/dL (ref 8–23)
CO2: 23 mmol/L (ref 22–32)
Calcium: 8.6 mg/dL — ABNORMAL LOW (ref 8.9–10.3)
Chloride: 117 mmol/L — ABNORMAL HIGH (ref 98–111)
Creatinine, Ser: 0.84 mg/dL (ref 0.61–1.24)
GFR, Estimated: >60 mL/min (ref >60)
Glucose, Bld: 131 mg/dL — ABNORMAL HIGH (ref 70–99)
Potassium: 3.9 mmol/L (ref 3.5–5.1)
Sodium: 143 mmol/L (ref 135–145)

## 2022-02-14 MED ORDER — PREDNISONE 20 MG PO TABS
40.0000 mg | ORAL_TABLET | Freq: Every day | ORAL | Status: DC
  Administered 2022-02-14 – 2022-02-15 (×2): 40 mg via ORAL
  Filled 2022-02-14 (×2): qty 2

## 2022-02-14 NOTE — Progress Notes (Signed)
PROGRESS NOTE  Jorge [name] DJM:[id number] DOB: 1955-08-23 DOA: 02/12/2022 PCP: Mackie Pai, PA-C   LOS: 1 day   Brief Narrative / Interim history: 66 year old male with HTN, multiple myeloma followed by Dr. Marin Olp, comes into the hospital with a week of left foot pain.  Initially started at the base of the left great toe.  It reminded him of her prior episode of cellulitis on his right foot was about 5 years ago.  No fevers at home.  He initially came to the ED, took Keflex and colchicine, did not improve and saw his PCP 2 days prior to admission.  Colchicine was increased, he was also given Rocephin and changed to doxycycline.  Again did not improve and came back to the hospital.  Subjective / 24h Interval events: Feels like he has less pain in his toe.  Is able to ambulate more.  Assesement and Plan: Principal Problem:   Cellulitis of left foot Active Problems:   Multiple myeloma (HCC)   Cellulitis   Thrombocytopenia (HCC)   Chronic cough   History of DVT (deep vein thrombosis)   HTN (hypertension)   Principal problem Left foot pain, gout attack versus cellulitis-given lack of improvement with antibiotics, negative procalcitonin this is probably gouty attack but cannot fully rule out an infectious component.  Dr. Marin Olp evaluated him as well, this is likely gout.  Continue prednisone, keep on antibiotic for 1 more day and hopefully he will continue to improve at the same rate and can go home tomorrow  Active problems Multiple myeloma-managed as an outpatient by Dr. Marin Olp  History of DVT-continue home Xarelto  Chronic thrombocytopenia-stable, monitor  Essential hypertension-resume home amlodipine  Scheduled Meds:  amLODipine  5 mg Oral Daily   colchicine  0.6 mg Oral BID   fluticasone  1 spray Each Nare Daily   ipratropium  1 spray Each Nare BID   predniSONE  40 mg Oral Q breakfast   rivaroxaban  10 mg Oral Daily   Continuous Infusions:  sodium chloride 125  mL/hr at 02/14/22 0833   ceFEPime (MAXIPIME) IV 2 g (02/14/22 0947)   vancomycin 1,000 mg (02/14/22 1223)   PRN Meds:.acetaminophen **OR** acetaminophen, albuterol, HYDROcodone-acetaminophen, ondansetron **OR** ondansetron (ZOFRAN) IV, mouth rinse, zolpidem  Diet Orders (From admission, onward)     Start     Ordered   02/12/22 1513  Diet regular Room service appropriate? Yes; Fluid consistency: Thin  Diet effective now       Question Answer Comment  Room service appropriate? Yes   Fluid consistency: Thin      02/12/22 1512            DVT prophylaxis: rivaroxaban (XARELTO) tablet 10 mg Start: 02/12/22 2145 rivaroxaban (XARELTO) tablet 10 mg   Lab Results  Component Value Date   PLT 143 (L) 02/14/2022      Code Status: Partial Code  Family Communication: no family at bedside   Status is: Inpatient   Level of care: Med-Surg  Consultants:  Oncology    Objective: Vitals:   02/13/22 2133 02/14/22 0418 02/14/22 0945 02/14/22 1359  BP: 121/77 107/66 120/79 124/72  Pulse: (!) 52 (!) 51 (!) 54 (!) 50  Resp: '16 16  18  ' Temp: (!) 97.4 F (36.3 C) (!) 97.5 F (36.4 C)  98.2 F (36.8 C)  TempSrc: Oral Oral  Oral  SpO2: 96% 97%  93%  Weight:      Height:        Intake/Output Summary (Last 24  hours) at 02/14/2022 1446 Last data filed at 02/13/2022 1800 Gross per 24 hour  Intake 710.52 ml  Output --  Net 710.52 ml    Wt Readings from Last 3 Encounters:  02/12/22 83.9 kg  02/12/22 83.9 kg  02/10/22 83.9 kg    Examination:  Constitutional: NAD Eyes: lids and conjunctivae normal, no scleral icterus ENMT: mmm Neck: normal, supple Respiratory: clear to auscultation bilaterally, no wheezing, no crackles.  Cardiovascular: Regular rate and rhythm, no murmurs / rubs / gallops. No LE edema. Abdomen: soft, no distention, no tenderness. Bowel sounds positive.  Skin: Toe rash improved, almost resolved this morning Neurologic: no focal deficits, equal  strength   Data Reviewed: I have independently reviewed following labs and imaging studies   CBC Recent Labs  Lab 02/10/22 1011 02/12/22 1038 02/13/22 0453 02/14/22 0425  WBC 5.2 4.6 3.7* 6.2  HGB 15.7 15.3 13.5 13.9  HCT 46.5 45.0 39.0 40.5  PLT 135.0* 144* 111* 143*  MCV 104.1* 102.7* 102.4* 103.3*  MCH  --  34.9* 35.4* 35.5*  MCHC 33.6 34.0 34.6 34.3  RDW 15.3 13.9 13.6 13.9  LYMPHSABS 2.1 2.0  --   --   MONOABS 0.6 0.7  --   --   EOSABS 0.1 0.1  --   --   BASOSABS 0.0 0.1  --   --      Recent Labs  Lab 02/12/22 1038 02/12/22 1041 02/12/22 1900 02/13/22 0453 02/14/22 0425  NA 138  --   --  141 143  K 4.2  --   --  3.9 3.9  CL 109  --   --  116* 117*  CO2 26  --   --  21* 23  GLUCOSE 84  --   --  93 131*  BUN 14  --   --  13 11  CREATININE 0.95  --   --  0.73 0.84  CALCIUM 8.9  --   --  8.0* 8.6*  AST 34  --   --  23  --   ALT 35  --   --  27  --   ALKPHOS 50  --   --  39  --   BILITOT 1.0  --   --  0.9  --   ALBUMIN 3.9  --   --  3.0*  --   CRP 2.5*  --   --   --   --   PROCALCITON  --   --  <0.10  --   --   LATICACIDVEN  --  1.3  --   --   --      ------------------------------------------------------------------------------------------------------------------ No results for input(s): "CHOL", "HDL", "LDLCALC", "TRIG", "CHOLHDL", "LDLDIRECT" in the last 72 hours.  Lab Results  Component Value Date   HGBA1C 5.1 05/14/2016   ------------------------------------------------------------------------------------------------------------------ No results for input(s): "TSH", "T4TOTAL", "T3FREE", "THYROIDAB" in the last 72 hours.  Invalid input(s): "FREET3"  Cardiac Enzymes No results for input(s): "CKMB", "TROPONINI", "MYOGLOBIN" in the last 168 hours.  Invalid input(s): "CK" ------------------------------------------------------------------------------------------------------------------    Component Value Date/Time   BNP 99.6 02/03/2018 0542     CBG: No results for input(s): "GLUCAP" in the last 168 hours.  Recent Results (from the past 240 hour(s))  Blood Cultures x 2 sites     Status: None (Preliminary result)   Collection Time: 02/12/22 10:40 AM   Specimen: BLOOD  Result Value Ref Range Status   Specimen Description BLOOD RIGHT ANTECUBITAL  Final   Special Requests  Final    BOTTLES DRAWN AEROBIC AND ANAEROBIC Blood Culture adequate volume   Culture   Final    NO GROWTH 2 DAYS Performed at [facility], [place] [address], [place], [place] [address]    Report Status PENDING  Incomplete  Blood Cultures x 2 sites     Status: None (Preliminary result)   Collection Time: 02/12/22 11:00 AM   Specimen: BLOOD  Result Value Ref Range Status   Specimen Description BLOOD LEFT ANTECUBITAL  Final   Special Requests   Final    BOTTLES DRAWN AEROBIC AND ANAEROBIC Blood Culture adequate volume   Culture   Final    NO GROWTH 2 DAYS Performed at [facility], [place] [address], [place], [place] [address]    Report Status PENDING  Incomplete     Radiology Studies: No results found.   Marzetta Board, MD, PhD [facility]  Between 7 am - 7 pm I am available, please contact me via Amion (for emergencies) or Securechat (non urgent messages)  Between 7 pm - 7 am I am not available, please contact night coverage MD/APP via Amion

## 2022-02-15 DIAGNOSIS — L03116 Cellulitis of left lower limb: Secondary | ICD-10-CM | POA: Diagnosis not present

## 2022-02-15 MED ORDER — AMOXICILLIN-POT CLAVULANATE 875-125 MG PO TABS: 1.0000 | ORAL_TABLET | Freq: Two times a day (BID) | ORAL | 0 refills | Status: AC

## 2022-02-15 MED ORDER — COLCHICINE 0.6 MG PO TABS: 0.6000 mg | ORAL_TABLET | Freq: Two times a day (BID) | ORAL | 0 refills | Status: DC | PRN

## 2022-02-15 MED ORDER — ALLOPURINOL 100 MG PO TABS: 100.0000 mg | ORAL_TABLET | Freq: Every day | ORAL | Status: DC

## 2022-02-15 MED ORDER — ALLOPURINOL 100 MG PO TABS: 100.0000 mg | ORAL_TABLET | Freq: Every day | ORAL | 2 refills | Status: DC

## 2022-02-15 MED ORDER — PREDNISONE 20 MG PO TABS: 40.0000 mg | ORAL_TABLET | Freq: Every day | ORAL | 0 refills | Status: DC

## 2022-02-15 NOTE — Progress Notes (Signed)
[name] is doing a lot better.  His left foot looks much better.  There is still a little swollen.  However, there really is not much redness.  He is walking.  Hopefully, he will go home today.  Had an episode of gout.  As such, I think he is going to need to be on allopurinol as an outpatient.  He is getting ready to go on a cruise to [place].  I think this is going to be in the next week or so.  His labs show a blood sugar 131.  I feel this is probably from steroids.  His BUN is 11 creatinine 0.84.  The white cell count 6.2.  Hemoglobin 13.9.  Platelet count 143,000.  He has had no fever.  His appetite is good.  He has had no bleeding.  He is on Xarelto.  His vital signs are all stable.  His vital signs are temperature 98.4.  Pulse 70.  Blood pressure 132/85.  Has had an exam shows no ocular or oral lesions.  He has no adenopathy in the neck.  Lungs are clear.  Cardiac exam regular rate and rhythm.  Abdomen is soft.  Bowel sounds are present.  There is no fluid wave.  Extremities does show some slight swelling in the distal medial aspect of the right foot.  There is no erythema.  He has no tenderness to palpation.  He has better range of motion of his big toe joint.  Neurological exam is nonfocal.  Mr. Carothers has myeloma.  He is in remission.  He had an episode of gout.  He has some cellulitis.  Cultures were all negative.  Hopefully, he will go home today.  He is looking very good.  I will see him on Thursday for his regular appointment.  Again, I think he will need allopurinol as an outpatient for uric acid control.  It is apparent that he has had incredible care from the wonderful staff on [address].  Lattie Haw, MD  [facility] [phone]

## 2022-02-15 NOTE — Discharge Summary (Addendum)
Physician Discharge Summary  Jorge [name] WLS:[id number] DOB: 1955-09-04 DOA: 02/12/2022  PCP: Mackie Pai, PA-C  Admit date: 02/12/2022 Discharge date: 02/15/2022  Admitted From: home Disposition:  home  Recommendations for Outpatient Follow-up:  Follow up with PCP in 1-2 weeks  Home Health: none Equipment/Devices: none  Discharge Condition: stable CODE STATUS: Full code Diet Orders (From admission, onward)     Start     Ordered   02/12/22 1513  Diet regular Room service appropriate? Yes; Fluid consistency: Thin  Diet effective now       Question Answer Comment  Room service appropriate? Yes   Fluid consistency: Thin      02/12/22 1512            HPI: Per admitting MD, Jorge [name] is a 66 y.o. male with medical history significant of HTN, MVP, MM, chronic cough, GERD. Presenting with 1 week of left foot pain. It started in his left great toe. He had sharp, constant pain. OTC meds for pain did not help. He noticed some swelling and decided to go to the ED because this reminded him of a previous episode of cellulitis. He didn't have any apparent injury. He didn't have any fevers. He reports possible chills. No drainage noted in the area. In the ED he was given a prescription for keflex 532m QID and colchicine 0.647mdaily. His symptoms did not improve. 2 days ago, he saw his PCP. His colchicine was increased to BID dosing, he was give rocephin 1g IM, and sent home with doxycycline. His pain did not seem to improve over these last 2 days, so he returned to the ED for assistance.    Hospital Course / Discharge diagnoses: Principal Problem:   Cellulitis of left foot Active Problems:   Multiple myeloma (HCC)   Cellulitis   Thrombocytopenia (HCC)   Chronic cough   History of DVT (deep vein thrombosis)   HTN (hypertension)   Principal problem Left foot pain, gout attack versus cellulitis-given lack of improvement with antibiotics as an outpatient, negative  procalcitonin this is probably gouty attack but cannot fully rule out an infectious component.  He was placed on antibiotics as well as steroids while hospitalized with clinical improvement in his joint swelling and pain.  He is now able to walk without much difficulties.  His cultures have remained negative.  We will place him short course of steroids and antibiotics upon discharge and he will be started on allopurinol for which is more likely gout.   Active problems Multiple myeloma-managed as an outpatient by Dr. EnMarin Olpistory of DVT-continue home Xarelto Chronic thrombocytopenia-stable, monitor Essential hypertension-resume home amlodipine  Sepsis ruled out   Discharge Instructions   Allergies as of 02/15/2022       Reactions   Singulair [montelukast] Other (See Comments)   Mood change        Medication List     STOP taking these medications    cephALEXin 500 MG capsule Commonly known as: KEFLEX       TAKE these medications    acetaminophen 325 MG tablet Commonly known as: TYLENOL Take 2 tablets (650 mg total) by mouth every 6 (six) hours as needed for mild pain (or Fever >/= 101).   albuterol 108 (90 Base) MCG/ACT inhaler Commonly known as: VENTOLIN HFA TAKE 2 PUFFS BY MOUTH EVERY 6 HOURS AS NEEDED FOR WHEEZE OR SHORTNESS OF BREATH What changed: See the new instructions.   allopurinol 100 MG tablet Commonly known as: ZYLOPRIM Take  1 tablet (100 mg total) by mouth daily.   amLODipine 5 MG tablet Commonly known as: NORVASC Take 1 tablet (5 mg total) by mouth daily.   amoxicillin-clavulanate 875-125 MG tablet Commonly known as: AUGMENTIN Take 1 tablet by mouth 2 (two) times daily for 7 days.   cetirizine 10 MG tablet Commonly known as: ZYRTEC Take 10 mg by mouth daily as needed for allergies.   colchicine 0.6 MG tablet Take 1 tablet (0.6 mg total) by mouth 2 (two) times daily as needed (when having a gout flare). What changed:  when to take  this reasons to take this   colesevelam 625 MG tablet Commonly known as: Welchol Take 1 tablet (625 mg total) by mouth 2 (two) times daily with a meal.   doxycycline 100 MG tablet Commonly known as: VIBRA-TABS Take 1 tablet (100 mg total) by mouth 2 (two) times daily. Can give capsules or generic,   fluorouracil 5 % cream Commonly known as: EFUDEX Apply 1 Application topically 2 (two) times daily as needed (For rash on head).   fluticasone 50 MCG/ACT nasal spray Commonly known as: FLONASE SPRAY 2 SPRAYS INTO EACH NOSTRIL EVERY DAY What changed: See the new instructions.   gabapentin 300 MG capsule Commonly known as: NEURONTIN Take 300 mg by mouth 2 (two) times daily.   ipratropium 0.03 % nasal spray Commonly known as: ATROVENT Place 1-2 sprays into both nostrils 2 (two) times daily as needed (nasal drainage). What changed:  how much to take when to take this   lenalidomide 5 MG capsule Commonly known as: REVLIMID TAKE 1 CAPSULE BY MOUTH DAILY  FOR 21 DAYS, THEN 7 DAYS OFF What changed:  how much to take how to take this when to take this   loperamide 2 MG capsule Commonly known as: IMODIUM Take 2 mg by mouth as needed for diarrhea or loose stools.   predniSONE 20 MG tablet Commonly known as: DELTASONE Take 2 tablets (40 mg total) by mouth daily with breakfast for 7 days. Start taking on: February 16, 2022   Symbicort 160-4.5 MCG/ACT inhaler Generic drug: budesonide-formoterol TAKE 2 PUFFS BY MOUTH TWICE A DAY   valsartan 160 MG tablet Commonly known as: DIOVAN TAKE 1 TABLET BY MOUTH EVERY DAY   Vitamin D3 250 MCG (10000 UT) capsule Take 10,000 Units by mouth daily.   Xarelto 10 MG Tabs tablet Generic drug: rivaroxaban TAKE 1 TABLET BY MOUTH EVERY DAY What changed: how much to take   zolpidem 5 MG tablet Commonly known as: AMBIEN Take 1 tablet (5 mg total) by mouth at bedtime as needed for sleep.        Follow-up Information     Saguier, Iris Pert .   Specialties: Internal Medicine, Family Medicine Contact information: [address] [place] [address] [phone]                 Consultations: Oncology   Procedures/Studies:  DG Foot Complete Left  Result Date: 02/12/2022 CLINICAL DATA:  Left foot redness, pain, and swelling 02/06/2022. EXAM: LEFT FOOT - COMPLETE 3+ VIEW COMPARISON:  Left foot radiographs 02/10/2022 FINDINGS: Mild dorsal talonavicular and tarsometatarsal degenerative osteophytosis on lateral view. Unchanged probable mild-to-moderate soft tissue swelling of the forefoot and toes. This appears to be greatest around the great toe metatarsophalangeal joint. Minimal great toe metatarsophalangeal joint space narrowing and lateral greater than medial degenerative osteophytosis. No cortical erosion or periostitis. IMPRESSION: 1. Soft tissue swelling of the forefoot  greatest around the great toe metatarsophalangeal joint is similar to prior. 2. Minimal great toe metatarsophalangeal joint osteoarthritis. 3. No radiographic evidence of inflammatory arthropathy such as gout. Electronically Signed   By: Yvonne Kendall M.D.   On: 02/12/2022 10:57   DG Foot Complete Left  Result Date: 02/10/2022 CLINICAL DATA:  Acute left foot pain. EXAM: LEFT FOOT - COMPLETE 3+ VIEW COMPARISON:  February 07, 2022. FINDINGS: There is no evidence of fracture or dislocation. There is no evidence of arthropathy or other focal bone abnormality. Soft tissues are unremarkable. IMPRESSION: Negative. Electronically Signed   By: Marijo Conception M.D.   On: 02/10/2022 10:56   DG Foot Complete Left  Result Date: 02/07/2022 CLINICAL DATA:  Left foot pain. Redness at first toe. Concern for osteomyelitis. EXAM: LEFT FOOT - COMPLETE 3+ VIEW COMPARISON:  Left foot radiographs 04/06/2018 FINDINGS: Mild dorsal talonavicular degenerative osteophytosis. There is mild great toe soft tissue swelling which appears similar to prior. Minimal  peripheral medial aspect of the great toe metatarsal head degenerative spurring. No cortical erosion is seen. No subcutaneous air. No acute fracture or dislocation. IMPRESSION: No radiographic evidence of osteomyelitis. Electronically Signed   By: Yvonne Kendall M.D.   On: 02/07/2022 09:51     Subjective: - no chest pain, shortness of breath, no abdominal pain, nausea or vomiting.   Discharge Exam: BP 132/85 (BP Location: Left Arm)   Pulse 70   Temp 98.4 F (36.9 C) (Oral)   Resp 20   Ht '5\' 7"'  (1.702 m)   Wt 83.9 kg   SpO2 91%   BMI 28.97 kg/m   General: Pt is alert, awake, not in acute distress   The results of significant diagnostics from this hospitalization (including imaging, microbiology, ancillary and laboratory) are listed below for reference.     Microbiology: Recent Results (from the past 240 hour(s))  Blood Cultures x 2 sites     Status: None (Preliminary result)   Collection Time: 02/12/22 10:40 AM   Specimen: BLOOD  Result Value Ref Range Status   Specimen Description BLOOD RIGHT ANTECUBITAL  Final   Special Requests   Final    BOTTLES DRAWN AEROBIC AND ANAEROBIC Blood Culture adequate volume   Culture   Final    NO GROWTH 3 DAYS Performed at [facility], [address], [place], [place] [address]    Report Status PENDING  Incomplete  Blood Cultures x 2 sites     Status: None (Preliminary result)   Collection Time: 02/12/22 11:00 AM   Specimen: BLOOD  Result Value Ref Range Status   Specimen Description BLOOD LEFT ANTECUBITAL  Final   Special Requests   Final    BOTTLES DRAWN AEROBIC AND ANAEROBIC Blood Culture adequate volume   Culture   Final    NO GROWTH 3 DAYS Performed at [facility], [place] [address], [place], [place] [address]    Report Status PENDING  Incomplete     Labs: Basic Metabolic Panel: Recent Labs  Lab 02/12/22 1038 02/13/22 0453 02/14/22 0425  NA 138 141 143  K 4.2 3.9 3.9  CL 109 116* 117*  CO2 26 21* 23   GLUCOSE 84 93 131*  BUN '14 13 11  ' CREATININE 0.95 0.73 0.84  CALCIUM 8.9 8.0* 8.6*   Liver Function Tests: Recent Labs  Lab 02/12/22 1038 02/13/22 0453  AST 34 23  ALT 35 27  ALKPHOS 50 39  BILITOT 1.0 0.9  PROT 7.4 5.9*  ALBUMIN 3.9 3.0*   CBC: Recent Labs  Lab 02/10/22 1011 02/12/22 1038 02/13/22 0453 02/14/22 0425  WBC 5.2 4.6 3.7* 6.2  NEUTROABS 2.3 1.9  --   --   HGB 15.7 15.3 13.5 13.9  HCT 46.5 45.0 39.0 40.5  MCV 104.1* 102.7* 102.4* 103.3*  PLT 135.0* 144* 111* 143*   CBG: No results for input(s): "GLUCAP" in the last 168 hours. Hgb A1c No results for input(s): "HGBA1C" in the last 72 hours. Lipid Profile No results for input(s): "CHOL", "HDL", "LDLCALC", "TRIG", "CHOLHDL", "LDLDIRECT" in the last 72 hours. Thyroid function studies No results for input(s): "TSH", "T4TOTAL", "T3FREE", "THYROIDAB" in the last 72 hours.  Invalid input(s): "FREET3" Urinalysis    Component Value Date/Time   COLORURINE YELLOW 02/02/2018 [place] 02/02/2018 1305   LABSPEC 1.014 02/02/2018 1305   PHURINE 6.0 02/02/2018 1305   GLUCOSEU NEGATIVE 02/02/2018 1305   GLUCOSEU NEGATIVE 01/21/2018 0738   HGBUR SMALL (A) 02/02/2018 1305   BILIRUBINUR NEGATIVE 02/02/2018 1305   KETONESUR NEGATIVE 02/02/2018 1305   PROTEINUR NEGATIVE 02/02/2018 1305   UROBILINOGEN 0.2 01/21/2018 0738   NITRITE NEGATIVE 02/02/2018 1305   LEUKOCYTESUR NEGATIVE 02/02/2018 1305    FURTHER DISCHARGE INSTRUCTIONS:   Get Medicines reviewed and adjusted: Please take all your medications with you for your next visit with your Primary MD   Laboratory/radiological data: Please request your Primary MD to go over all hospital tests and procedure/radiological results at the follow up, please ask your Primary MD to get all Hospital records sent to his/her office.   In some cases, they will be blood work, cultures and biopsy results pending at the time of your discharge. Please request that  your primary care M.D. goes through all the records of your hospital data and follows up on these results.   Also Note the following: If you experience worsening of your admission symptoms, develop shortness of breath, life threatening emergency, suicidal or homicidal thoughts you must seek medical attention immediately by calling 911 or calling your MD immediately  if symptoms less severe.   You must read complete instructions/literature along with all the possible adverse reactions/side effects for all the Medicines you take and that have been prescribed to you. Take any new Medicines after you have completely understood and accpet all the possible adverse reactions/side effects.    Do not drive when taking Pain medications or sleeping medications (Benzodaizepines)   Do not take more than prescribed Pain, Sleep and Anxiety Medications. It is not advisable to combine anxiety,sleep and pain medications without talking with your primary care practitioner   Special Instructions: If you have smoked or chewed Tobacco  in the last 2 yrs please stop smoking, stop any regular Alcohol  and or any Recreational drug use.   Wear Seat belts while driving.   Please note: You were cared for by a hospitalist during your hospital stay. Once you are discharged, your primary care physician will handle any further medical issues. Please note that NO REFILLS for any discharge medications will be authorized once you are discharged, as it is imperative that you return to your primary care physician (or establish a relationship with a primary care physician if you do not have one) for your post hospital discharge needs so that they can reassess your need for medications and monitor your lab values.  Time coordinating discharge: 35 minutes  SIGNED:  Marzetta Board, MD, PhD 02/15/2022, 8:17 AM

## 2022-02-17 ENCOUNTER — Telehealth: Payer: Self-pay

## 2022-02-17 LAB — CULTURE, BLOOD (ROUTINE X 2)
Culture: NO GROWTH
Culture: NO GROWTH
Special Requests: ADEQUATE
Special Requests: ADEQUATE

## 2022-02-17 NOTE — Telephone Encounter (Signed)
Transition Care Management Follow-up Telephone Call Date of discharge and from where: [facility] 02-15-22 Dx: cellulitis of left foot  How have you been since you were released from the hospital? Doing good  Any questions or concerns? No  Items Reviewed: Did the pt receive and understand the discharge instructions provided? Yes  Medications obtained and verified? Yes  Other? No  Any new allergies since your discharge? No  Dietary orders reviewed? Yes Do you have support at home? Yes   Home Care and Equipment/Supplies: Were home health services ordered? no If so, what is the name of the agency? na  Has the agency set up a time to come to the patient's home? not applicable Were any new equipment or medical supplies ordered?  No What is the name of the medical supply agency? na Were you able to get the supplies/equipment? not applicable Do you have any questions related to the use of the equipment or supplies? No  Functional Questionnaire: (I = Independent and D = Dependent) ADLs: I  Bathing/Dressing- I  Meal Prep- I  Eating- I  Maintaining continence- I  Transferring/Ambulation- I  Managing Meds- I  Follow up appointments reviewed:  PCP Hospital f/u appt confirmed? Yes  Scheduled to see Dr Harvie Heck on 02-21-22 @ [facility] f/u appt confirmed? No  . Are transportation arrangements needed? No  If their condition worsens, is the pt aware to call PCP or go to the Emergency Dept.? Yes Was the patient provided with contact information for the PCP's office or ED? Yes Was to pt encouraged to call back with questions or concerns? Yes

## 2022-02-20 ENCOUNTER — Inpatient Hospital Stay: Payer: Medicare Other

## 2022-02-20 ENCOUNTER — Inpatient Hospital Stay: Payer: Medicare Other | Attending: Hematology & Oncology

## 2022-02-20 ENCOUNTER — Encounter: Payer: Self-pay | Admitting: Hematology & Oncology

## 2022-02-20 ENCOUNTER — Telehealth: Payer: Self-pay | Admitting: *Deleted

## 2022-02-20 ENCOUNTER — Inpatient Hospital Stay (HOSPITAL_BASED_OUTPATIENT_CLINIC_OR_DEPARTMENT_OTHER): Payer: Medicare Other | Admitting: Hematology & Oncology

## 2022-02-20 VITALS — BP 131/86 | HR 65 | Temp 97.9°F | Resp 20 | Ht 67.0 in | Wt 188.1 lb

## 2022-02-20 VITALS — BP 141/92 | HR 65

## 2022-02-20 DIAGNOSIS — Z86718 Personal history of other venous thrombosis and embolism: Secondary | ICD-10-CM | POA: Diagnosis not present

## 2022-02-20 DIAGNOSIS — D509 Iron deficiency anemia, unspecified: Secondary | ICD-10-CM | POA: Insufficient documentation

## 2022-02-20 DIAGNOSIS — Z9484 Stem cells transplant status: Secondary | ICD-10-CM | POA: Insufficient documentation

## 2022-02-20 DIAGNOSIS — Z7901 Long term (current) use of anticoagulants: Secondary | ICD-10-CM | POA: Insufficient documentation

## 2022-02-20 DIAGNOSIS — Z79899 Other long term (current) drug therapy: Secondary | ICD-10-CM | POA: Insufficient documentation

## 2022-02-20 DIAGNOSIS — M10072 Idiopathic gout, left ankle and foot: Secondary | ICD-10-CM | POA: Diagnosis not present

## 2022-02-20 DIAGNOSIS — C9000 Multiple myeloma not having achieved remission: Secondary | ICD-10-CM | POA: Insufficient documentation

## 2022-02-20 LAB — CMP (CANCER CENTER ONLY)
ALT: 44 U/L (ref 0–44)
AST: 28 U/L (ref 15–41)
Albumin: 4.1 g/dL (ref 3.5–5.0)
Alkaline Phosphatase: 48 U/L (ref 38–126)
Anion gap: 6 (ref 5–15)
BUN: 16 mg/dL (ref 8–23)
CO2: 30 mmol/L (ref 22–32)
Calcium: 9.3 mg/dL (ref 8.9–10.3)
Chloride: 104 mmol/L (ref 98–111)
Creatinine: 0.91 mg/dL (ref 0.61–1.24)
GFR, Estimated: >60 mL/min (ref >60)
Glucose, Bld: 103 mg/dL — ABNORMAL HIGH (ref 70–99)
Potassium: 3.6 mmol/L (ref 3.5–5.1)
Sodium: 140 mmol/L (ref 135–145)
Total Bilirubin: 0.8 mg/dL (ref 0.3–1.2)
Total Protein: 6.4 g/dL — ABNORMAL LOW (ref 6.5–8.1)

## 2022-02-20 LAB — CBC WITH DIFFERENTIAL (CANCER CENTER ONLY)
Abs Immature Granulocytes: 0.04 10*3/uL (ref 0.00–0.07)
Basophils Absolute: 0.1 10*3/uL (ref 0.0–0.1)
Basophils Relative: 1 %
Eosinophils Absolute: 0.2 10*3/uL (ref 0.0–0.5)
Eosinophils Relative: 5 %
HCT: 43.2 % (ref 39.0–52.0)
Hemoglobin: 14.8 g/dL (ref 13.0–17.0)
Immature Granulocytes: 1 %
Lymphocytes Relative: 39 %
Lymphs Abs: 1.8 10*3/uL (ref 0.7–4.0)
MCH: 34.7 pg — ABNORMAL HIGH (ref 26.0–34.0)
MCHC: 34.3 g/dL (ref 30.0–36.0)
MCV: 101.4 fL — ABNORMAL HIGH (ref 80.0–100.0)
Monocytes Absolute: 0.4 10*3/uL (ref 0.1–1.0)
Monocytes Relative: 8 %
Neutro Abs: 2 10*3/uL (ref 1.7–7.7)
Neutrophils Relative %: 46 %
Platelet Count: 197 10*3/uL (ref 150–400)
RBC: 4.26 MIL/uL (ref 4.22–5.81)
RDW: 13.7 % (ref 11.5–15.5)
WBC Count: 4.5 10*3/uL (ref 4.0–10.5)
nRBC: 0 % (ref 0.0–0.2)

## 2022-02-20 LAB — IRON AND IRON BINDING CAPACITY (CC-WL,HP ONLY)
Iron: 109 ug/dL (ref 45–182)
Saturation Ratios: 33 % (ref 17.9–39.5)
TIBC: 332 ug/dL (ref 250–450)
UIBC: 223 ug/dL (ref 117–376)

## 2022-02-20 LAB — URIC ACID: Uric Acid, Serum: 5 mg/dL (ref 3.7–8.6)

## 2022-02-20 LAB — LACTATE DEHYDROGENASE: LDH: 204 U/L — ABNORMAL HIGH (ref 98–192)

## 2022-02-20 LAB — FERRITIN: Ferritin: 90 ng/mL (ref 24–336)

## 2022-02-20 MED ORDER — ZOLEDRONIC ACID 4 MG/100ML IV SOLN
4.0000 mg | Freq: Once | INTRAVENOUS | Status: AC
  Administered 2022-02-20: 4 mg via INTRAVENOUS
  Filled 2022-02-20: qty 100

## 2022-02-20 MED ORDER — SODIUM CHLORIDE 0.9 % IV SOLN: INTRAVENOUS | Status: DC

## 2022-02-20 MED ORDER — BUDESONIDE-FORMOTEROL FUMARATE 160-4.5 MCG/ACT IN AERO
INHALATION_SPRAY | RESPIRATORY_TRACT | 4 refills | Status: DC
Start: 2022-02-20 — End: 2022-11-10

## 2022-02-20 NOTE — Telephone Encounter (Signed)
As noted below by Dr. Marin Olp, I informed him that the uric acid level is normal now. The Allopurinol is doing the job! Have a great trip to [place]! He verbalized understanding.

## 2022-02-20 NOTE — Telephone Encounter (Signed)
-----   Message from [name], MD sent at 02/20/2022  2:55 PM EDT ----- Call - the uric acid is normal now!!  The allopurinol is doing the job!!  Have a great trip to [place]!!  Laurey Arrow

## 2022-02-20 NOTE — Patient Instructions (Signed)

## 2022-02-20 NOTE — Progress Notes (Signed)
Hematology and Oncology Follow Up Visit  Jorge [name] [id number] 10-Apr-1956 66 y.o. 02/20/2022   Principle Diagnosis:  IgG Kappa myeloma - Hyperdiploid/+11 DVT of the LEFT and RIGHT leg  Iron deficiency anemia  Current Therapy:   Revlimid 2m po q day (21 on/7 off) - start on 04/07/2018   Zometa 4 mg IV q 3 months - next dose is 02/2022 -- Xarelto 10 mg PO daily IV Iron as needed   Interim History:  Jorge [name] here today for follow-up.  He was in the hospital recently.  He had a gouty attack.  This was in his left big toe.  He now is on allopurinol.  In the hospital, he had little bit of cellulitis.  Thankfully, his blood counts were okay.  Today is his birthday.  He will have a nice birthday.  [place] 3 [place]  He has had no problems with nausea or vomiting.  There is been no change in bowel or bladder habits.  He has had no cough or shortness of breath.  He does have little bit of asthma.  There is been no rashes.  He has had no bleeding.  His last myeloma studies did not show any obvious monoclonal spike.  His IgG level was 1084 mg/dL.  The Kappa light chain was 3.9 mg/dL.  Currently, I would say his performance status is probably ECOG 1.     Medications:  Allergies as of 02/20/2022       Reactions   Singulair [montelukast] Other (See Comments)   Mood change        Medication List        Accurate as of February 20, 2022 12:55 PM. If you have any questions, ask your nurse or doctor.          STOP taking these medications    cetirizine 10 MG tablet Commonly known as: ZYRTEC Stopped by: [name]   colesevelam 625 MG tablet Commonly known as: Welchol Stopped by: [name]   doxycycline 100 MG tablet Commonly known as: VIBRA-TABS Stopped by: [name]   predniSONE 20 MG tablet Commonly known as: DELTASONE Stopped by: [name]       TAKE these medications    acetaminophen  325 MG tablet Commonly known as: TYLENOL Take 2 tablets (650 mg total) by mouth every 6 (six) hours as needed for mild pain (or Fever >/= 101).   albuterol 108 (90 Base) MCG/ACT inhaler Commonly known as: VENTOLIN HFA TAKE 2 PUFFS BY MOUTH EVERY 6 HOURS AS NEEDED FOR WHEEZE OR SHORTNESS OF BREATH What changed: See the new instructions.   allopurinol 100 MG tablet Commonly known as: ZYLOPRIM Take 1 tablet (100 mg total) by mouth daily.   amLODipine 5 MG tablet Commonly known as: NORVASC Take 1 tablet (5 mg total) by mouth daily.   amoxicillin-clavulanate 875-125 MG tablet Commonly known as: AUGMENTIN Take 1 tablet by mouth 2 (two) times daily for 7 days.   colchicine 0.6 MG tablet Take 1 tablet (0.6 mg total) by mouth 2 (two) times daily as needed (when having a gout flare).   fluorouracil 5 % cream Commonly known as: EFUDEX Apply 1 Application topically 2 (two) times daily as needed (For rash on head).   fluticasone 50 MCG/ACT nasal spray Commonly known as: FLONASE SPRAY 2 SPRAYS INTO EACH NOSTRIL EVERY DAY What changed: See the new instructions.   gabapentin  300 MG capsule Commonly known as: NEURONTIN Take 300 mg by mouth 2 (two) times daily.   ipratropium 0.03 % nasal spray Commonly known as: ATROVENT Place 1-2 sprays into both nostrils 2 (two) times daily as needed (nasal drainage). What changed:  how much to take when to take this   lenalidomide 5 MG capsule Commonly known as: REVLIMID TAKE 1 CAPSULE BY MOUTH DAILY  FOR 21 DAYS, THEN 7 DAYS OFF   loperamide 2 MG capsule Commonly known as: IMODIUM Take 2 mg by mouth as needed for diarrhea or loose stools.   Symbicort 160-4.5 MCG/ACT inhaler Generic drug: budesonide-formoterol TAKE 2 PUFFS BY MOUTH TWICE A DAY   valsartan 160 MG tablet Commonly known as: DIOVAN TAKE 1 TABLET BY MOUTH EVERY DAY   Vitamin D3 250 MCG (10000 UT) capsule Take 10,000 Units by mouth daily.   Xarelto 10 MG Tabs tablet Generic  drug: rivaroxaban TAKE 1 TABLET BY MOUTH EVERY DAY What changed: how much to take   zolpidem 5 MG tablet Commonly known as: AMBIEN Take 1 tablet (5 mg total) by mouth at bedtime as needed for sleep.        Allergies:  Allergies  Allergen Reactions   Singulair [Montelukast] Other (See Comments)    Mood change    Past Medical History, Surgical history, Social history, and Family History were reviewed and updated.  Review of Systems: Review of Systems  Constitutional: Negative.   HENT: Negative.   Eyes: Negative.   Respiratory: Negative.   Cardiovascular: Negative.   Gastrointestinal: Negative.   Genitourinary: Negative.   Musculoskeletal: Positive for myalgias.  Skin: Negative.   Neurological: Negative.   Endo/Heme/Allergies: Negative.   Psychiatric/Behavioral: Negative.      Physical Exam:  height is '5\' 7"'  (1.702 m) and weight is 188 lb 1.9 oz (85.3 kg). His oral temperature is 97.9 F (36.6 C). His blood pressure is 131/86 and his pulse is 65. His respiration is 20 and oxygen saturation is 98%.   Wt Readings from Last 3 Encounters:  02/20/22 188 lb 1.9 oz (85.3 kg)  02/12/22 185 lb (83.9 kg)  02/12/22 185 lb (83.9 kg)    Physical Exam Vitals signs reviewed.  HENT:     Head: Normocephalic and atraumatic.  Eyes:     Pupils: Pupils are equal, round, and reactive to light.  Neck:     Musculoskeletal: Normal range of motion.  Cardiovascular:     Rate and Rhythm: Normal rate and regular rhythm.     Heart sounds: Normal heart sounds.  Pulmonary:     Effort: Pulmonary effort is normal.     Breath sounds: Normal breath sounds.  Abdominal:     General: Bowel sounds are normal.     Palpations: Abdomen is soft.  Musculoskeletal: Normal range of motion.        General: No tenderness or deformity.  Lymphadenopathy:     Cervical: No cervical adenopathy.  Skin:    General: Skin is warm and dry.     Findings: No erythema or rash.  Neurological:     Mental  Status: He is alert and oriented to person, place, and time.  Psychiatric:        Behavior: Behavior normal.        Thought Content: Thought content normal.        Judgment: Judgment normal.      Lab Results  Component Value Date   WBC 4.5 02/20/2022   HGB 14.8 02/20/2022  HCT 43.2 02/20/2022   MCV 101.4 (H) 02/20/2022   PLT 197 02/20/2022   Lab Results  Component Value Date   FERRITIN 85 12/06/2021   IRON 186 (H) 12/06/2021   TIBC 350 12/06/2021   UIBC 164 12/06/2021   IRONPCTSAT 53 (H) 12/06/2021   Lab Results  Component Value Date   RETICCTPCT 1.9 07/11/2021   RBC 4.26 02/20/2022   Lab Results  Component Value Date   KPAFRELGTCHN 38.9 (H) 12/06/2021   LAMBDASER 20.2 12/06/2021   KAPLAMBRATIO 1.93 (H) 12/06/2021   Lab Results  Component Value Date   IGGSERUM 1,084 12/06/2021   IGA 391 12/06/2021   IGMSERUM 23 12/06/2021   Lab Results  Component Value Date   TOTALPROTELP 6.7 12/06/2021   ALBUMINELP 4.0 12/06/2021   A1GS 0.2 12/06/2021   A2GS 0.5 12/06/2021   BETS 0.9 12/06/2021   GAMS 1.1 12/06/2021   MSPIKE Not Observed 12/06/2021   SPEI Comment 01/05/2018     Chemistry      Component Value Date/Time   NA 140 02/20/2022 1147   NA 143 06/30/2017 1049   NA 140 10/03/2016 0845   K 3.6 02/20/2022 1147   K 4.0 06/30/2017 1049   K 4.3 10/03/2016 0845   CL 104 02/20/2022 1147   CL 105 06/30/2017 1049   CO2 30 02/20/2022 1147   CO2 27 06/30/2017 1049   CO2 26 10/03/2016 0845   BUN 16 02/20/2022 1147   BUN 13 06/30/2017 1049   BUN 11.9 10/03/2016 0845   CREATININE 0.91 02/20/2022 1147   CREATININE 1.0 06/30/2017 1049   CREATININE 0.9 10/03/2016 0845      Component Value Date/Time   CALCIUM 9.3 02/20/2022 1147   CALCIUM 9.0 06/30/2017 1049   CALCIUM 9.9 10/03/2016 0845   ALKPHOS 48 02/20/2022 1147   ALKPHOS 57 06/30/2017 1049   ALKPHOS 80 10/03/2016 0845   AST 28 02/20/2022 1147   AST 28 10/03/2016 0845   ALT 44 02/20/2022 1147   ALT 43  06/30/2017 1049   ALT 26 10/03/2016 0845   BILITOT 0.8 02/20/2022 1147   BILITOT 0.48 10/03/2016 0845      Impression and Plan: Jorge [name] is a pleasant 67 yo gentleman with IgG kappa myeloma.  He ultimately underwent an autologous stem cell transplant at [facility] in February 2018.   I hate the fact that he had the gout.  It looks a lot better right now.  I do not see a problem with him going on his trip up to [place].  I told to make sure he drinks a lot of fluid.  He will take some prednisone with him.  We will go ahead and do his Zometa today.  We will then plan to get him back in 3 more months.   [name], MD 8/10/202312:55 PM

## 2022-02-21 ENCOUNTER — Ambulatory Visit (INDEPENDENT_AMBULATORY_CARE_PROVIDER_SITE_OTHER): Payer: Medicare Other | Admitting: Medical

## 2022-02-21 ENCOUNTER — Encounter: Payer: Self-pay | Admitting: *Deleted

## 2022-02-21 VITALS — BP 131/78 | HR 74 | Temp 98.0°F | Resp 16 | Ht 67.0 in | Wt 188.4 lb

## 2022-02-21 DIAGNOSIS — M1000 Idiopathic gout, unspecified site: Secondary | ICD-10-CM | POA: Diagnosis not present

## 2022-02-21 LAB — IGG, IGA, IGM
IgA: 354 mg/dL (ref 61–437)
IgG (Immunoglobin G), Serum: 985 mg/dL (ref 603–1613)
IgM (Immunoglobulin M), Srm: 38 mg/dL (ref 20–172)

## 2022-02-21 LAB — KAPPA/LAMBDA LIGHT CHAINS
Kappa free light chain: 20.2 mg/L — ABNORMAL HIGH (ref 3.3–19.4)
Kappa, lambda light chain ratio: 0.99 (ref 0.26–1.65)
Lambda free light chains: 20.5 mg/L (ref 5.7–26.3)

## 2022-02-21 LAB — BETA 2 MICROGLOBULIN, SERUM: Beta-2 Microglobulin: 1.5 mg/L (ref 0.6–2.4)

## 2022-02-21 MED ORDER — ZOLPIDEM TARTRATE 5 MG PO TABS
5.0000 mg | ORAL_TABLET | Freq: Every evening | ORAL | 1 refills | Status: DC | PRN
Start: 2022-02-21 — End: 2022-10-17

## 2022-02-21 NOTE — Patient Instructions (Addendum)
Recent probable gout with cellulitis. Doing well know post hospitalization. You have augmentin and prednisone to use if red foot swell again as before.(While on vacation)  For insomnia refilling your ambien 5 mg 30 tab with refill. Up to date on uds an contract.  For cough will continue gabapentin rx'd by pulmonologist.  Flu vaccine in sept or oct.  Follow up as 6 months or sooner if needed.

## 2022-02-21 NOTE — Progress Notes (Signed)
Subjective:    Patient ID: Jorge [name], male    DOB: May 08, 1956, 66 y.o.   MRN: [id number]  HPI   Pt in for follow up.  Admit date: 02/12/2022 Discharge date: 02/15/2022  HPI: Per admitting MD, Jorge [name] is a 66 y.o. male with medical history significant of HTN, MVP, MM, chronic cough, GERD. Presenting with 1 week of left foot pain. It started in his left great toe. He had sharp, constant pain. OTC meds for pain did not help. He noticed some swelling and decided to go to the ED because this reminded him of a previous episode of cellulitis. He didn't have any apparent injury. He didn't have any fevers. He reports possible chills. No drainage noted in the area. In the ED he was given a prescription for keflex 543m QID and colchicine 0.619mdaily. His symptoms did not improve. 2 days ago, he saw his PCP. His colchicine was increased to BID dosing, he was give rocephin 1g IM, and sent home with doxycycline. His pain did not seem to improve over these last 2 days, so he returned to the ED for assistance.      Hospital Course / Discharge diagnoses: Principal Problem:   Cellulitis of left foot Active Problems:   Multiple myeloma (HCC)   Cellulitis   Thrombocytopenia (HCC)   Chronic cough   History of DVT (deep vein thrombosis)   HTN (hypertension)  Principal problem Left foot pain, gout attack versus cellulitis-given lack of improvement with antibiotics as an outpatient, negative procalcitonin this is probably gouty attack but cannot fully rule out an infectious component.  He was placed on antibiotics as well as steroids while hospitalized with clinical improvement in his joint swelling and pain.  He is now able to walk without much difficulties.  His cultures have remained negative.  We will place him short course of steroids and antibiotics upon discharge and he will be started on allopurinol for which is more likely gout.   Active problems Multiple myeloma-managed as an outpatient  by Dr. EnMarin Olpistory of DVT-continue home Xarelto Chronic thrombocytopenia-stable, monitor Essential hypertension-resume home amlodipine    Pt states 2nd night in hospital foot got very swollen. After that foot got iv antibiotic and solumedrol.   Now foot feels a lot better.   He states presently on ranodom slight burn sensation to left great toe. With walking faint pain.  About to head out to [place] Sunday.  Dr. EnMarin Olpnd Dr [name] think it would be ok for him to have augmentin, and prednisone if foot reflares. Now on allopurinol.  Also needs refill ambien. He is up to date on contract and uds.   Mild dry cough since in hospital. Had for months. When in hospital and temp changes cough seemed to get mild worse. Pt saw specialist in past who rx'd gabapentin.  Review of Systems  Constitutional:  Negative for chills, fatigue and fever.  Respiratory:  Negative for cough, chest tightness, shortness of breath and wheezing.   Cardiovascular:  Negative for chest pain and palpitations.  Gastrointestinal:  Negative for abdominal pain.  Musculoskeletal:  Negative for back pain.       Left foot feels better.  Neurological:  Negative for dizziness and headaches.  Psychiatric/Behavioral:  Positive for sleep disturbance.     Past Medical History:  Diagnosis Date   Allergy    Anxiety    Asthma    Blood transfusion without reported diagnosis 2018   Bone metastasis  Chest cold 05/19/2016   productive cough  -- started on antibiotic   Chronic back pain    due to bone mets from myeloma   Cough    Depression    Diverticulitis    GERD (gastroesophageal reflux disease)    Hiatal hernia    History of chicken pox    History of concussion    age 44 -- no residual   History of DVT of lower extremity 03/21/2016  treated and completed w/ xarelto   per doppler left extensive occlusion common femoral, femoral, and popliteal veins and right partial occlusion common femoral and  profunda femoral veins/  last doppler 06-04-2016 no evidence acute or chronic dvt noted either leg    History of radiation therapy 06/09/16-06/23/16   lower thoracic spine 25 Gy in 10 fractions   Mouth ulcers    secondary to radiation   Multiple myeloma ([place]) dx 02/22/2016 via bone marrow bx---  oncologist-  [name]   IgG Kappa-- Hyperdiploid/ +11 w/ bone mets--  current treatment chemotherapy (started 08/ 2017)and pallitive radiation to back started 06-09-2016   Renal calculus, right    Wears contact lenses      Social History   Socioeconomic History   Marital status: Married    Spouse name: Not on file   Number of children: 3   Years of education: Not on file   Highest education level: Not on file  Occupational History   Not on file  Tobacco Use   Smoking status: Former    Packs/day: 1.00    Years: 7.00    Total pack years: 7.00    Types: Cigarettes    Quit date: 11/02/1980    Years since quitting: 41.3   Smokeless tobacco: Never  Vaping Use   Vaping Use: Never used  Substance and Sexual Activity   Alcohol use: Yes    Comment: occasional   Drug use: No   Sexual activity: Never  Other Topics Concern   Not on file  Social History Narrative   Not on file   Social Determinants of Health   Financial Resource Strain: Low Risk  (07/02/2021)   Overall Financial Resource Strain (CARDIA)    Difficulty of Paying Living Expenses: Not hard at all  Food Insecurity: No Food Insecurity (07/02/2021)   Hunger Vital Sign    Worried About Running Out of Food in the Last Year: Never true    Ran Out of Food in the Last Year: Never true  Transportation Needs: No Transportation Needs (07/02/2021)   PRAPARE - Hydrologist (Medical): No    Lack of Transportation (Non-Medical): No  Physical Activity: Sufficiently Active (07/02/2021)   Exercise Vital Sign    Days of Exercise per Week: 7 days    Minutes of Exercise per Session: 60 min  Stress: No Stress  Concern Present (07/02/2021)   [employer]    Feeling of Stress : Not at all  Social Connections: Moderately Integrated (07/02/2021)   Social Connection and Isolation Panel [NHANES]    Frequency of Communication with Friends and Family: More than three times a week    Frequency of Social Gatherings with Friends and Family: More than three times a week    Attends Religious Services: More than 4 times per year    Active Member of [employer] or Organizations: No    Attends Archivist Meetings: Never    Marital Status: Married  Intimate Partner Violence: Not At Risk (07/02/2021)   Humiliation, Afraid, Rape, and Kick questionnaire    Fear of Current or Ex-Partner: No    Emotionally Abused: No    Physically Abused: No    Sexually Abused: No    Past Surgical History:  Procedure Laterality Date   COLONOSCOPY  2008,2013   COLONOSCOPY  01/07/2017   Dr Penelope Coop, [facility]   CYSTOSCOPY W/ URETERAL STENT PLACEMENT Right 06/20/2016   Procedure: CYSTOSCOPY WITH STENT REPLACEMENT;  Surgeon: Kathie Rhodes, MD;  Location: [facility];  Service: Urology;  Laterality: Right;   CYSTOSCOPY WITH RETROGRADE PYELOGRAM, URETEROSCOPY AND STENT PLACEMENT Right 05/30/2016   Procedure: CYSTOSCOPY WITH RETROGRADE PYELOGRAM, URETEROSCOPY AND STENT PLACEMENT,DILITATION URETERAL STRICTURE;  Surgeon: Kathie Rhodes, MD;  Location: [facility];  Service: Urology;  Laterality: Right;   CYSTOSCOPY/RETROGRADE/URETEROSCOPY/STONE EXTRACTION WITH BASKET Right 06/20/2016   Procedure: CYSTOSCOPY/URETEROSCOPY/STONE EXTRACTION WITH BASKET;  Surgeon: Kathie Rhodes, MD;  Location[facility];  Service: Urology;  Laterality: Right;   HIATAL HERNIA REPAIR N/A 08/13/2018   Procedure: LAPAROSCOPIC REPAIR OF HIATAL HERNIA REPAIR WITH FUNDOPLICATION AND INSERTION OF MESH;  Surgeon: Excell Seltzer, MD;  Location: [facility];  Service: General;  Laterality:  N/A;   HOLMIUM LASER APPLICATION Right 28/09/6627   Procedure: HOLMIUM LASER APPLICATION;  Surgeon: Kathie Rhodes, MD;  Location: [facility];  Service: Urology;  Laterality: Right;   IR GENERIC HISTORICAL  02/11/2016   IR RADIOLOGIST EVAL & MGMT 02/11/2016 [facility]   IR GENERIC HISTORICAL  02/15/2016   IR BONE TUMOR(S)RF ABLATION 02/15/2016 Luanne Bras, MD [facility]   IR GENERIC HISTORICAL  02/15/2016   IR BONE TUMOR(S)RF ABLATION 02/15/2016 Luanne Bras, MD [facility]   IR GENERIC HISTORICAL  02/15/2016   IR BONE TUMOR(S)RF ABLATION 02/15/2016 Luanne Bras, MD [facility]   IR GENERIC HISTORICAL  02/15/2016   IR KYPHO THORACIC WITH BONE BIOPSY 02/15/2016 Luanne Bras, MD [facility]   IR GENERIC HISTORICAL  02/15/2016   IR KYPHO THORACIC WITH BONE BIOPSY 02/15/2016 Luanne Bras, MD [facility]   IR GENERIC HISTORICAL  02/15/2016   IR VERTEBROPLASTY CERV/THOR BX INC UNI/BIL INC/INJECT/IMAGING 02/15/2016 Luanne Bras, MD [facility]   IR GENERIC HISTORICAL  03/13/2016   IR KYPHO EA ADDL LEVEL THORACIC OR LUMBAR 03/13/2016 Luanne Bras, MD [facility]   IR GENERIC HISTORICAL  03/13/2016   IR KYPHO EA ADDL LEVEL THORACIC OR LUMBAR 03/13/2016 Luanne Bras, MD [facility]   IR GENERIC HISTORICAL  03/13/2016   IR BONE TUMOR(S)RF ABLATION 03/13/2016 Luanne Bras, MD [facility]   IR GENERIC HISTORICAL  03/13/2016   IR KYPHO LUMBAR INC FX REDUCE BONE BX UNI/BIL CANNULATION INC/IMAGING 03/13/2016 Luanne Bras, MD [facility]   IR GENERIC HISTORICAL  03/13/2016   IR BONE TUMOR(S)RF ABLATION 03/13/2016 Luanne Bras, MD [facility]   IR GENERIC HISTORICAL  03/13/2016   IR BONE TUMOR(S)RF ABLATION 03/13/2016 Luanne Bras, MD [facility]   IR GENERIC HISTORICAL  03/31/2016   IR RADIOLOGIST EVAL & MGMT 03/31/2016 [facility]   KYPHOPLASTY     02/2016, 03/2016, 11/2016   LAPAROSCOPIC INGUINAL HERNIA REPAIR Bilateral 12-16-2013  dr  gross   RADIOLOGY WITH ANESTHESIA N/A 02/15/2016   Procedure: Spinal Ablation;  Surgeon: Luanne Bras, MD;  Location: [place];  Service: Radiology;  Laterality: N/A;   RADIOLOGY WITH ANESTHESIA N/A 03/13/2016   Procedure: LUMBER ABLATION;  Surgeon: Luanne Bras, MD;  Location: [place];  Service: Radiology;  Laterality:  N/A;   ROTATOR CUFF REPAIR Right 2003   spine operation     x1 in 2018 x3 in 2017   TONSILLECTOMY  age 14   VERTEBROPLASTY  01/2016   WISDOM TOOTH EXTRACTION      Family History  Problem Relation Age of Onset   Uterine cancer Mother    Ovarian cancer Mother    Colon cancer Mother        said it was rectal or colon but not sure   Rectal cancer Mother    Heart disease Father    Hypertension Father    Multiple sclerosis Sister    Paranoid behavior Brother    Drug abuse Brother    Schizophrenia Brother    Stroke Maternal Grandfather    Cancer Maternal Aunt    Leukemia Paternal Aunt    Healthy Son        x1   Healthy Daughter        x2   Allergies Daughter        x1   Diabetes Neg Hx    Alzheimer's disease Neg Hx    Parkinson's disease Neg Hx    Esophageal cancer Neg Hx    Stomach cancer Neg Hx     Allergies  Allergen Reactions   Singulair [Montelukast] Other (See Comments)    Mood change    Current Outpatient Medications on File Prior to Visit  Medication Sig Dispense Refill   acetaminophen (TYLENOL) 325 MG tablet Take 2 tablets (650 mg total) by mouth every 6 (six) hours as needed for mild pain (or Fever >/= 101).     albuterol (VENTOLIN HFA) 108 (90 Base) MCG/ACT inhaler TAKE 2 PUFFS BY MOUTH EVERY 6 HOURS AS NEEDED FOR WHEEZE OR SHORTNESS OF BREATH (Patient taking differently: Inhale 2 puffs into the lungs every 6 (six) hours as needed for shortness of breath.) 6.7 each 1   allopurinol (ZYLOPRIM) 100 MG tablet Take 1 tablet (100 mg total) by mouth daily. 30 tablet 2   amoxicillin-clavulanate (AUGMENTIN) 875-125 MG tablet Take 1 tablet by mouth 2  (two) times daily for 7 days. 14 tablet 0   budesonide-formoterol (SYMBICORT) 160-4.5 MCG/ACT inhaler TAKE 2 PUFFS BY MOUTH TWICE A DAY 10.2 each 4   Cholecalciferol (VITAMIN D3) 250 MCG (10000 UT) capsule Take 10,000 Units by mouth daily.     colchicine 0.6 MG tablet Take 1 tablet (0.6 mg total) by mouth 2 (two) times daily as needed (when having a gout flare). 30 tablet 0   fluorouracil (EFUDEX) 5 % cream Apply 1 Application topically 2 (two) times daily as needed (For rash on head).     fluticasone (FLONASE) 50 MCG/ACT nasal spray SPRAY 2 SPRAYS INTO EACH NOSTRIL EVERY DAY (Patient taking differently: Place 1 spray into both nostrils daily.) 16 mL 1   gabapentin (NEURONTIN) 300 MG capsule Take 300 mg by mouth 2 (two) times daily.     ipratropium (ATROVENT) 0.03 % nasal spray Place 1-2 sprays into both nostrils 2 (two) times daily as needed (nasal drainage). (Patient taking differently: Place 1 spray into both nostrils 2 (two) times daily.) 30 mL 5   lenalidomide (REVLIMID) 5 MG capsule TAKE 1 CAPSULE BY MOUTH DAILY  FOR 21 DAYS, THEN 7 DAYS OFF 21 capsule 0   loperamide (IMODIUM) 2 MG capsule Take 2 mg by mouth as needed for diarrhea or loose stools.     valsartan (DIOVAN) 160 MG tablet TAKE 1 TABLET BY MOUTH EVERY DAY (Patient taking  differently: Take 160 mg by mouth daily.) 30 tablet 3   XARELTO 10 MG TABS tablet TAKE 1 TABLET BY MOUTH EVERY DAY (Patient taking differently: Take 10 mg by mouth daily.) 90 tablet 3   amLODipine (NORVASC) 5 MG tablet Take 1 tablet (5 mg total) by mouth daily. 90 tablet 3   Current Facility-Administered Medications on File Prior to Visit  Medication Dose Route Frequency Provider Last Rate Last Admin   0.9 %  sodium chloride infusion   Intravenous PRN Jessicah Croll, Percell Miller, PA-C       heparin lock flush 100 unit/mL  500 Units Intravenous Once Celso Amy, NP       sodium chloride flush (NS) 0.9 % injection 3 mL  3 mL Intravenous Once PRN Celso Amy, NP         BP 131/78 (BP Location: Right Arm, Patient Position: Sitting, Cuff Size: Normal)   Pulse 74   Temp 98 F (36.7 C) (Oral)   Resp 16   Ht 5' 7" (1.702 m)   Wt 188 lb 6.4 oz (85.5 kg)   SpO2 96%   BMI 29.51 kg/m        Objective:   Physical Exam  General Mental Status- Alert. General Appearance- Not in acute distress.   Skin General: Color- Normal Color. Moisture- Normal Moisture.  Chest and Lung Exam Auscultation: Breath Sounds:-Normal.  Cardiovascular Auscultation:Rythm- Regular. Murmurs & Other Heart Sounds:Auscultation of the heart reveals- No Murmurs.  Abdomen Inspection:-Inspeection Normal. Palpation/Percussion:Note:No mass. Palpation and Percussion of the abdomen reveal- Non Tender, Non Distended + BS, no rebound or guarding.   Neurologic Cranial Nerve exam:- CN III-XII intact(No nystagmus), symmetric smile. Strength:- 5/5 equal and symmetric strength both upper and lower extremities.    Left foot- on inspection. No swelling, redness or warmth.     Assessment & Plan:   Patient Instructions  Recent probable gout with cellulitis. Doing well know post hospitalization. You have augmentin and prednisone to use if red foot swell again as before.(While on vacation)  For insomnia refilling your ambien 5 mg 30 tab with refill. Up to date on uds an contract.  For cough will continue gabapentin rx'd by pulmonologist.  Flu vaccine in sept or oct.  Follow up as 6 months or sooner if needed.   Mackie Pai, PA-C

## 2022-02-24 LAB — PROTEIN ELECTROPHORESIS, SERUM, WITH REFLEX
A/G Ratio: 1.3 (ref 0.7–1.7)
Albumin ELP: 3.6 g/dL (ref 2.9–4.4)
Alpha-1-Globulin: 0.3 g/dL (ref 0.0–0.4)
Alpha-2-Globulin: 0.6 g/dL (ref 0.4–1.0)
Beta Globulin: 0.9 g/dL (ref 0.7–1.3)
Gamma Globulin: 1 g/dL (ref 0.4–1.8)
Globulin, Total: 2.8 g/dL (ref 2.2–3.9)
Total Protein ELP: 6.4 g/dL (ref 6.0–8.5)

## 2022-03-02 ENCOUNTER — Other Ambulatory Visit: Payer: Self-pay | Admitting: Hematology & Oncology

## 2022-03-02 DIAGNOSIS — C9000 Multiple myeloma not having achieved remission: Secondary | ICD-10-CM

## 2022-03-03 ENCOUNTER — Encounter: Payer: Self-pay | Admitting: Hematology & Oncology

## 2022-03-03 ENCOUNTER — Other Ambulatory Visit: Payer: Self-pay | Admitting: *Deleted

## 2022-03-03 DIAGNOSIS — C9000 Multiple myeloma not having achieved remission: Secondary | ICD-10-CM

## 2022-03-03 MED ORDER — LENALIDOMIDE 5 MG PO CAPS: ORAL_CAPSULE | ORAL | 0 refills | Status: DC

## 2022-03-11 ENCOUNTER — Ambulatory Visit: Payer: Medicare Other | Admitting: Hematology & Oncology

## 2022-03-11 ENCOUNTER — Inpatient Hospital Stay: Payer: Medicare Other

## 2022-03-11 ENCOUNTER — Ambulatory Visit: Payer: Medicare Other

## 2022-03-25 ENCOUNTER — Inpatient Hospital Stay: Payer: Medicare Other | Attending: Hematology & Oncology

## 2022-03-31 ENCOUNTER — Other Ambulatory Visit (HOSPITAL_BASED_OUTPATIENT_CLINIC_OR_DEPARTMENT_OTHER): Payer: Self-pay

## 2022-03-31 ENCOUNTER — Telehealth: Payer: Self-pay | Admitting: *Deleted

## 2022-03-31 ENCOUNTER — Encounter: Payer: Self-pay | Admitting: Hematology & Oncology

## 2022-03-31 MED ORDER — AREXVY 120 MCG/0.5ML IM SUSR
INTRAMUSCULAR | 0 refills | Status: DC
  Filled 2022-03-31: qty 1, 1d supply, fill #0

## 2022-03-31 NOTE — Telephone Encounter (Signed)
Received a call from patient stating that he is having trouble finding someone to give him an RSV vaccine.  Called our [facility] and they are currently giving this vaccine with permission from Physician.  Talked with [name].  Perfectly ok for patient to get this vaccine.  PAtient appreciative of this information

## 2022-04-02 ENCOUNTER — Ambulatory Visit: Payer: Medicare Other | Admitting: Medical

## 2022-04-23 ENCOUNTER — Other Ambulatory Visit: Payer: Self-pay | Admitting: Hematology & Oncology

## 2022-04-23 ENCOUNTER — Other Ambulatory Visit: Payer: Self-pay | Admitting: *Deleted

## 2022-04-23 DIAGNOSIS — C9000 Multiple myeloma not having achieved remission: Secondary | ICD-10-CM

## 2022-04-23 MED ORDER — LENALIDOMIDE 5 MG PO CAPS: ORAL_CAPSULE | ORAL | 0 refills | Status: DC

## 2022-05-06 ENCOUNTER — Other Ambulatory Visit: Payer: Self-pay | Admitting: Emergency Medicine

## 2022-05-09 ENCOUNTER — Other Ambulatory Visit: Payer: Self-pay | Admitting: Emergency Medicine

## 2022-05-13 ENCOUNTER — Telehealth: Payer: Self-pay | Admitting: Medical

## 2022-05-13 DIAGNOSIS — L03032 Cellulitis of left toe: Secondary | ICD-10-CM

## 2022-05-13 MED ORDER — ALLOPURINOL 100 MG PO TABS: 100.0000 mg | ORAL_TABLET | Freq: Every day | ORAL | 2 refills | Status: DC

## 2022-05-13 NOTE — Telephone Encounter (Signed)
Medication:   allopurinol (ZYLOPRIM) 100 MG tablet [[id number]]   Has the patient contacted their pharmacy? No. (If no, request that the patient contact the pharmacy for the refill.) (If yes, when and what did the pharmacy advise?)  Preferred Pharmacy (with phone number or street name):   [id number] [place] [address] [place]Phone: [phone] Fax: [phone]   Agent: Please be advised that RX refills may take up to 3 business days. We ask that you follow-up with your pharmacy.

## 2022-05-13 NOTE — Telephone Encounter (Signed)
Rx sent 

## 2022-05-22 ENCOUNTER — Encounter: Payer: Self-pay | Admitting: Hematology & Oncology

## 2022-05-22 ENCOUNTER — Inpatient Hospital Stay: Payer: Medicare Other

## 2022-05-22 ENCOUNTER — Inpatient Hospital Stay (HOSPITAL_BASED_OUTPATIENT_CLINIC_OR_DEPARTMENT_OTHER): Payer: Medicare Other | Admitting: Hematology & Oncology

## 2022-05-22 ENCOUNTER — Inpatient Hospital Stay: Payer: Medicare Other | Attending: Hematology & Oncology

## 2022-05-22 VITALS — BP 136/81 | HR 62

## 2022-05-22 VITALS — BP 128/85 | HR 71 | Temp 98.3°F | Resp 20 | Ht 67.0 in | Wt 184.1 lb

## 2022-05-22 DIAGNOSIS — Z7901 Long term (current) use of anticoagulants: Secondary | ICD-10-CM | POA: Insufficient documentation

## 2022-05-22 DIAGNOSIS — Z9484 Stem cells transplant status: Secondary | ICD-10-CM | POA: Diagnosis not present

## 2022-05-22 DIAGNOSIS — C9000 Multiple myeloma not having achieved remission: Secondary | ICD-10-CM

## 2022-05-22 DIAGNOSIS — Z86718 Personal history of other venous thrombosis and embolism: Secondary | ICD-10-CM | POA: Diagnosis not present

## 2022-05-22 DIAGNOSIS — Z79899 Other long term (current) drug therapy: Secondary | ICD-10-CM | POA: Diagnosis not present

## 2022-05-22 DIAGNOSIS — D500 Iron deficiency anemia secondary to blood loss (chronic): Secondary | ICD-10-CM | POA: Insufficient documentation

## 2022-05-22 DIAGNOSIS — M10072 Idiopathic gout, left ankle and foot: Secondary | ICD-10-CM

## 2022-05-22 LAB — CMP (CANCER CENTER ONLY)
ALT: 35 U/L (ref 0–44)
AST: 32 U/L (ref 15–41)
Albumin: 4.3 g/dL (ref 3.5–5.0)
Alkaline Phosphatase: 48 U/L (ref 38–126)
Anion gap: 8 (ref 5–15)
BUN: 14 mg/dL (ref 8–23)
CO2: 27 mmol/L (ref 22–32)
Calcium: 9.7 mg/dL (ref 8.9–10.3)
Chloride: 106 mmol/L (ref 98–111)
Creatinine: 0.92 mg/dL (ref 0.61–1.24)
GFR, Estimated: >60 mL/min (ref >60)
Glucose, Bld: 112 mg/dL — ABNORMAL HIGH (ref 70–99)
Potassium: 3.9 mmol/L (ref 3.5–5.1)
Sodium: 141 mmol/L (ref 135–145)
Total Bilirubin: 0.9 mg/dL (ref 0.3–1.2)
Total Protein: 7.1 g/dL (ref 6.5–8.1)

## 2022-05-22 LAB — CBC WITH DIFFERENTIAL (CANCER CENTER ONLY)
Abs Immature Granulocytes: 0 10*3/uL (ref 0.00–0.07)
Basophils Absolute: 0 10*3/uL (ref 0.0–0.1)
Basophils Relative: 1 %
Eosinophils Absolute: 0.1 10*3/uL (ref 0.0–0.5)
Eosinophils Relative: 3 %
HCT: 42.7 % (ref 39.0–52.0)
Hemoglobin: 15 g/dL (ref 13.0–17.0)
Immature Granulocytes: 0 %
Lymphocytes Relative: 41 %
Lymphs Abs: 1.5 10*3/uL (ref 0.7–4.0)
MCH: 34.9 pg — ABNORMAL HIGH (ref 26.0–34.0)
MCHC: 35.1 g/dL (ref 30.0–36.0)
MCV: 99.3 fL (ref 80.0–100.0)
Monocytes Absolute: 0.5 10*3/uL (ref 0.1–1.0)
Monocytes Relative: 12 %
Neutro Abs: 1.7 10*3/uL (ref 1.7–7.7)
Neutrophils Relative %: 43 %
Platelet Count: 141 10*3/uL — ABNORMAL LOW (ref 150–400)
RBC: 4.3 MIL/uL (ref 4.22–5.81)
RDW: 13.6 % (ref 11.5–15.5)
WBC Count: 3.8 10*3/uL — ABNORMAL LOW (ref 4.0–10.5)
nRBC: 0 % (ref 0.0–0.2)

## 2022-05-22 LAB — LACTATE DEHYDROGENASE: LDH: 187 U/L (ref 98–192)

## 2022-05-22 LAB — URIC ACID: Uric Acid, Serum: 5.8 mg/dL (ref 3.7–8.6)

## 2022-05-22 MED ORDER — ZOLEDRONIC ACID 4 MG/100ML IV SOLN
4.0000 mg | Freq: Once | INTRAVENOUS | Status: AC
  Administered 2022-05-22: 4 mg via INTRAVENOUS
  Filled 2022-05-22: qty 100

## 2022-05-22 MED ORDER — SODIUM CHLORIDE 0.9 % IV SOLN: Freq: Once | INTRAVENOUS | Status: AC

## 2022-05-22 NOTE — Progress Notes (Signed)
Hematology and Oncology Follow Up Visit  Zebediah Beezley [id number] April 27, 1956 66 y.o. 05/22/2022   Principle Diagnosis:  IgG Kappa myeloma - Hyperdiploid/+11 DVT of the LEFT and RIGHT leg  Iron deficiency anemia  Current Therapy:   Revlimid 22m po q day (21 on/7 off) - start on 04/07/2018   Zometa 4 mg IV q 3 months - next dose is 08/2022 Xarelto 10 mg PO daily IV Iron as needed   Interim History:  Mr. PRucinskiis here today for follow-up.  Since we last saw him, he really has been quite busy.  [place]  [facility] [place] [place]  He is doing well.  He has had no issues.  He had no problems traveling.  He is on Xarelto.  He is done well with the Xarelto.  As far as his myeloma is concerned, he is doing well with that.  We last saw him, there is no monoclonal spike in his blood.  His IgG level was 985 mg/dL.  The Kappa light chain was 2 mg/dL.  He has had no change in bowel or bladder habits.  He has had no bleeding.  There is been no rashes.  He has had no leg swelling.  He has had no cough or shortness of breath.  His last iron studies back in August showed a ferritin of 90 with an iron saturation of 33%.  Thankfully, he has had no problems with recurrent gout.  Overall, his performance status is ECOG 0. .  Medications:  Allergies as of 05/22/2022       Reactions   Singulair [montelukast] Other (See Comments)   Mood change        Medication List        Accurate as of May 22, 2022  2:19 PM. If you have any questions, ask your nurse or doctor.          STOP taking these medications    Arexvy 120 MCG/0.5ML injection Generic drug: RSV vaccine recomb adjuvanted Stopped by: [name]       TAKE these medications    acetaminophen 325 MG tablet Commonly known as: TYLENOL Take 2 tablets (650 mg total) by mouth  every 6 (six) hours as needed for mild pain (or Fever >/= 101).   albuterol 108 (90 Base) MCG/ACT inhaler Commonly known as: VENTOLIN HFA TAKE 2 PUFFS BY MOUTH EVERY 6 HOURS AS NEEDED FOR WHEEZE OR SHORTNESS OF BREATH   allopurinol 100 MG tablet Commonly known as: ZYLOPRIM Take 1 tablet (100 mg total) by mouth daily.   amLODipine 5 MG tablet Commonly known as: NORVASC Take 1 tablet (5 mg total) by mouth daily.   budesonide-formoterol 160-4.5 MCG/ACT inhaler Commonly known as: Symbicort TAKE 2 PUFFS BY MOUTH TWICE A DAY   colchicine 0.6 MG tablet Take 1 tablet (0.6 mg total) by mouth 2 (two) times daily as needed (when having a gout flare).   fluorouracil 5 % cream Commonly known as: EFUDEX Apply 1 Application topically 2 (two) times daily as needed (For rash on head).   fluticasone 50 MCG/ACT nasal spray Commonly known as: FLONASE SPRAY 2 SPRAYS INTO EACH NOSTRIL EVERY DAY   gabapentin 300 MG capsule Commonly known as: NEURONTIN Take 300 mg by mouth 2 (two) times daily.   ipratropium 0.03 % nasal spray Commonly  known as: ATROVENT Place 1-2 sprays into both nostrils 2 (two) times daily as needed (nasal drainage). What changed:  how much to take when to take this   lenalidomide 5 MG capsule Commonly known as: REVLIMID TAKE 1 CAPSULE BY MOUTH DAILY  FOR 21 DAYS, THEN 7 DAYS OFF [id number]   loperamide 2 MG capsule Commonly known as: IMODIUM Take 2 mg by mouth as needed for diarrhea or loose stools.   valsartan 160 MG tablet Commonly known as: DIOVAN TAKE 1 TABLET BY MOUTH EVERY DAY   Vitamin D3 250 MCG (10000 UT) capsule Take 10,000 Units by mouth daily.   Xarelto 10 MG Tabs tablet Generic drug: rivaroxaban TAKE 1 TABLET BY MOUTH EVERY DAY What changed: how much to take   zolpidem 5 MG tablet Commonly known as: AMBIEN Take 1 tablet (5 mg total) by mouth at bedtime as needed for sleep.        Allergies:  Allergies  Allergen Reactions   Singulair  [Montelukast] Other (See Comments)    Mood change    Past Medical History, Surgical history, Social history, and Family History were reviewed and updated.  Review of Systems: Review of Systems  Constitutional: Negative.   HENT: Negative.   Eyes: Negative.   Respiratory: Negative.   Cardiovascular: Negative.   Gastrointestinal: Negative.   Genitourinary: Negative.   Musculoskeletal: Positive for myalgias.  Skin: Negative.   Neurological: Negative.   Endo/Heme/Allergies: Negative.   Psychiatric/Behavioral: Negative.      Physical Exam:  height is 5' 7" (1.702 m) and weight is 184 lb 1.9 oz (83.5 kg). His oral temperature is 98.3 F (36.8 C). His blood pressure is 128/85 and his pulse is 71. His respiration is 20 and oxygen saturation is 97%.   Wt Readings from Last 3 Encounters:  05/22/22 184 lb 1.9 oz (83.5 kg)  02/21/22 188 lb 6.4 oz (85.5 kg)  02/20/22 188 lb 1.9 oz (85.3 kg)    Physical Exam Vitals signs reviewed.  HENT:     Head: Normocephalic and atraumatic.  Eyes:     Pupils: Pupils are equal, round, and reactive to light.  Neck:     Musculoskeletal: Normal range of motion.  Cardiovascular:     Rate and Rhythm: Normal rate and regular rhythm.     Heart sounds: Normal heart sounds.  Pulmonary:     Effort: Pulmonary effort is normal.     Breath sounds: Normal breath sounds.  Abdominal:     General: Bowel sounds are normal.     Palpations: Abdomen is soft.  Musculoskeletal: Normal range of motion.        General: No tenderness or deformity.  Lymphadenopathy:     Cervical: No cervical adenopathy.  Skin:    General: Skin is warm and dry.     Findings: No erythema or rash.  Neurological:     Mental Status: He is alert and oriented to person, place, and time.  Psychiatric:        Behavior: Behavior normal.        Thought Content: Thought content normal.        Judgment: Judgment normal.      Lab Results  Component Value Date   WBC 3.8 (L) 05/22/2022    HGB 15.0 05/22/2022   HCT 42.7 05/22/2022   MCV 99.3 05/22/2022   PLT 141 (L) 05/22/2022   Lab Results  Component Value Date   FERRITIN 90 02/20/2022   IRON 109 02/20/2022   TIBC  332 02/20/2022   UIBC 223 02/20/2022   IRONPCTSAT 33 02/20/2022   Lab Results  Component Value Date   RETICCTPCT 1.9 07/11/2021   RBC 4.30 05/22/2022   Lab Results  Component Value Date   KPAFRELGTCHN 20.2 (H) 02/20/2022   LAMBDASER 20.5 02/20/2022   KAPLAMBRATIO 0.99 02/20/2022   Lab Results  Component Value Date   IGGSERUM 985 02/20/2022   IGA 354 02/20/2022   IGMSERUM 38 02/20/2022   Lab Results  Component Value Date   TOTALPROTELP 6.4 02/20/2022   ALBUMINELP 3.6 02/20/2022   A1GS 0.3 02/20/2022   A2GS 0.6 02/20/2022   BETS 0.9 02/20/2022   GAMS 1.0 02/20/2022   MSPIKE Not Observed 02/20/2022   SPEI Comment 01/05/2018     Chemistry      Component Value Date/Time   NA 141 05/22/2022 1323   NA 143 06/30/2017 1049   NA 140 10/03/2016 0845   K 3.9 05/22/2022 1323   K 4.0 06/30/2017 1049   K 4.3 10/03/2016 0845   CL 106 05/22/2022 1323   CL 105 06/30/2017 1049   CO2 27 05/22/2022 1323   CO2 27 06/30/2017 1049   CO2 26 10/03/2016 0845   BUN 14 05/22/2022 1323   BUN 13 06/30/2017 1049   BUN 11.9 10/03/2016 0845   CREATININE 0.92 05/22/2022 1323   CREATININE 1.0 06/30/2017 1049   CREATININE 0.9 10/03/2016 0845      Component Value Date/Time   CALCIUM 9.7 05/22/2022 1323   CALCIUM 9.0 06/30/2017 1049   CALCIUM 9.9 10/03/2016 0845   ALKPHOS 48 05/22/2022 1323   ALKPHOS 57 06/30/2017 1049   ALKPHOS 80 10/03/2016 0845   AST 32 05/22/2022 1323   AST 28 10/03/2016 0845   ALT 35 05/22/2022 1323   ALT 43 06/30/2017 1049   ALT 26 10/03/2016 0845   BILITOT 0.9 05/22/2022 1323   BILITOT 0.48 10/03/2016 0845      Impression and Plan: Mr. Bogie is a pleasant 66 yo gentleman with IgG kappa myeloma.  He ultimately underwent an autologous stem cell transplant at [facility] in February  2018.   Everything is looking quite good for him.  I will have to believe that the myeloma still in remission.  He is on Revlimid.  He has had no problems with the Revlimid.  His blood counts are actually pretty good.  He will get his Zometa today.  I will then plan to get him back in 3 months.   [name], MD 11/9/20232:19 PM

## 2022-05-22 NOTE — Patient Instructions (Signed)
[facility]  Discharge Instructions: Thank you for choosing [facility] to provide your oncology and hematology care.   If you have a lab appointment with the Cancer Center, please go directly to the Cancer Center and check in at the registration area.  Wear comfortable clothing and clothing appropriate for easy access to any Portacath or PICC line.   We strive to give you quality time with your provider. You may need to reschedule your appointment if you arrive late (15 or more minutes).  Arriving late affects you and other patients whose appointments are after yours.  Also, if you miss three or more appointments without notifying the office, you may be dismissed from the clinic at the provider's discretion.      For prescription refill requests, have your pharmacy contact our office and allow 72 hours for refills to be completed.    Today you received the following chemotherapy and/or immunotherapy agents Zometa      To help prevent nausea and vomiting after your treatment, we encourage you to take your nausea medication as directed.  BELOW ARE SYMPTOMS THAT SHOULD BE REPORTED IMMEDIATELY: *FEVER GREATER THAN 100.4 F (38 C) OR HIGHER *CHILLS OR SWEATING *NAUSEA AND VOMITING THAT IS NOT CONTROLLED WITH YOUR NAUSEA MEDICATION *UNUSUAL SHORTNESS OF BREATH *UNUSUAL BRUISING OR BLEEDING *URINARY PROBLEMS (pain or burning when urinating, or frequent urination) *BOWEL PROBLEMS (unusual diarrhea, constipation, pain near the anus) TENDERNESS IN MOUTH AND THROAT WITH OR WITHOUT PRESENCE OF ULCERS (sore throat, sores in mouth, or a toothache) UNUSUAL RASH, SWELLING OR PAIN  UNUSUAL VAGINAL DISCHARGE OR ITCHING   Items with * indicate a potential emergency and should be followed up as soon as possible or go to the Emergency Department if any problems should occur.  Please show the CHEMOTHERAPY ALERT CARD or IMMUNOTHERAPY ALERT CARD at check-in to the  Emergency Department and triage nurse. Should you have questions after your visit or need to cancel or reschedule your appointment, please contact [facility]  [phone] and follow the prompts.  Office hours are 8:00 a.m. to 4:30 p.m. Monday - Friday. Please note that voicemails left after 4:00 p.m. may not be returned until the following business day.  We are closed weekends and major holidays. You have access to a nurse at all times for urgent questions. Please call the main number to the clinic [phone] and follow the prompts.  For any non-urgent questions, you may also contact your provider using MyChart. We now offer e-Visits for anyone 18 and older to request care online for non-urgent symptoms. For details visit [email or web address].   Also download the MyChart app! Go to the app store, search "MyChart", open the app, select [facility], and log in with your MyChart username and password.  Masks are optional in the cancer centers. If you would like for your care team to wear a mask while they are taking care of you, please let them know. You may have one support person who is at least 66 years old accompany you for your appointments. 

## 2022-05-23 ENCOUNTER — Other Ambulatory Visit: Payer: Self-pay | Admitting: Emergency Medicine

## 2022-05-23 LAB — KAPPA/LAMBDA LIGHT CHAINS
Kappa free light chain: 33.2 mg/L — ABNORMAL HIGH (ref 3.3–19.4)
Kappa, lambda light chain ratio: 1.33 (ref 0.26–1.65)
Lambda free light chains: 24.9 mg/L (ref 5.7–26.3)

## 2022-05-24 LAB — IGG, IGA, IGM
IgA: 408 mg/dL (ref 61–437)
IgG (Immunoglobin G), Serum: 1040 mg/dL (ref 603–1613)
IgM (Immunoglobulin M), Srm: 44 mg/dL (ref 20–172)

## 2022-05-26 LAB — PROTEIN ELECTROPHORESIS, SERUM, WITH REFLEX
A/G Ratio: 1.4 (ref 0.7–1.7)
Albumin ELP: 3.8 g/dL (ref 2.9–4.4)
Alpha-1-Globulin: 0.2 g/dL (ref 0.0–0.4)
Alpha-2-Globulin: 0.5 g/dL (ref 0.4–1.0)
Beta Globulin: 0.9 g/dL (ref 0.7–1.3)
Gamma Globulin: 1.1 g/dL (ref 0.4–1.8)
Globulin, Total: 2.7 g/dL (ref 2.2–3.9)
Total Protein ELP: 6.5 g/dL (ref 6.0–8.5)

## 2022-05-28 NOTE — Progress Notes (Unsigned)
Follow Up Note  RE: [name] MRN: [id number] DOB: [birth year] Date of Office Visit: 05/29/2022  Referring provider: Elise Benne Primary care provider: Mackie Pai, PA-C  Chief Complaint: No chief complaint on file.  History of Present Illness: I had the pleasure of seeing [name] for a follow up visit at [facility] on 05/28/2022. He is a 66 y.o. male, who is being followed for allergic rhinitis, chronic cough and frequent episodes of pneumonia. His previous allergy office visit was on 11/26/2021 with Dr. Maudie Mercury. Today is a regular follow up visit.  Perennial allergic rhinitis Past history - Perennial rhinitis symptoms for the last 5 years with worsening coughing in the mornings.  Tried Flonase and Astelin with good benefit.  No benefit with antihistamines.  2022 skin testing showed: Positive to perennial mold #4 and dust mites. Singulair caused mood changes. Interim history - much improved. ENT eval done (septal deviation, turbinate hypertrophy), on gabapentin for cough. Use Atrovent (ipratropium) 0.03% 1-2 sprays per nostril twice a day as needed for runny nose/drainage. Use Flonase (fluticasone) nasal spray 1 spray per nostril twice a day as needed for nasal congestion.  Nasal saline spray (i.e., Simply Saline) or nasal saline lavage (i.e., NeilMed) is recommended as needed and prior to medicated nasal sprays. Continue environmental control measures. Use over the counter antihistamines such as Zyrtec (cetirizine), Claritin (loratadine), Allegra (fexofenadine), or Xyzal (levocetirizine) daily as needed. May take twice a day during allergy flares. May switch antihistamines every few months.   Chronic cough Past history - Coughing, wheezing, shortness of breath and chest tightness for 5 years.  Tried albuterol and Symbicort as needed with some benefit.  COVID-19 infection 2 months ago.  Being treated for multiple myeloma for the last 5 years.   Follows with pulmonology.  Tried PPI with no benefit. 2022 PFT showed restriction, 2022 CT chest showed atelectasis, hiatal hernia, lytic rib lesions. Interim history - coughing 70-80% improved with gabapentin. No inhaler use.  Follow with pulmonology/ENT. Continue gabapentin as per ENT. Daily controller medication(s): none. During upper respiratory infections/asthma flares: start Symbicort 162mg 2 puffs twice a day and rinse mouth after each use for 1-2 weeks until your breathing symptoms return to baseline.  May use albuterol rescue inhaler 2 puffs every 4 to 6 hours as needed for shortness of breath, chest tightness, coughing, and wheezing. May use albuterol rescue inhaler 2 puffs 5 to 15 minutes prior to strenuous physical activities. Monitor frequency of use.    Frequent episodes of pneumonia Past history - Frequent bouts of pneumonia requiring hospitalization and ? intubation in the past. Medical history significant for multiple myeloma which is in remission.  Interim history - no infections since last visit.  Keep track of infections and antibiotics use. Consider getting basic immune evaluation bloodwork if getting frequent infections in future.    Return in about 6 months (around 05/29/2022).  Assessment and Plan: Jorge [name] a 66y.o. male with: No problem-specific Assessment & Plan notes found for this encounter.  No follow-ups on file.  No orders of the defined types were placed in this encounter.  Lab Orders  No laboratory test(s) ordered today    Diagnostics: Spirometry:  Tracings reviewed. His effort: {Blank single:19197::"Good reproducible efforts.","It was hard to get consistent efforts and there is a question as to whether this reflects a maximal maneuver.","Poor effort, data can not be interpreted."} FVC: ***L FEV1: ***L, ***% predicted FEV1/FVC ratio: ***% Interpretation: {Blank single:19197::"Spirometry consistent  with mild obstructive disease","Spirometry  consistent with moderate obstructive disease","Spirometry consistent with severe obstructive disease","Spirometry consistent with possible restrictive disease","Spirometry consistent with mixed obstructive and restrictive disease","Spirometry uninterpretable due to technique","Spirometry consistent with normal pattern","No overt abnormalities noted given today's efforts"}.  Please see scanned spirometry results for details.  Skin Testing: {Blank single:19197::"Select foods","Environmental allergy panel","Environmental allergy panel and select foods","Food allergy panel","None","Deferred due to recent antihistamines use"}. *** Results discussed with patient/family.   Medication List:  Current Outpatient Medications  Medication Sig Dispense Refill  . acetaminophen (TYLENOL) 325 MG tablet Take 2 tablets (650 mg total) by mouth every 6 (six) hours as needed for mild pain (or Fever >/= 101).    [name] albuterol (VENTOLIN HFA) 108 (90 Base) MCG/ACT inhaler TAKE 2 PUFFS BY MOUTH EVERY 6 HOURS AS NEEDED FOR WHEEZE OR SHORTNESS OF BREATH (Patient not taking: Reported on 05/22/2022) 6.7 each 1  . allopurinol (ZYLOPRIM) 100 MG tablet Take 1 tablet (100 mg total) by mouth daily. 30 tablet 2  . amLODipine (NORVASC) 5 MG tablet Take 1 tablet (5 mg total) by mouth daily. 90 tablet 3  . budesonide-formoterol (SYMBICORT) 160-4.5 MCG/ACT inhaler TAKE 2 PUFFS BY MOUTH TWICE A DAY 10.2 each 4  . Cholecalciferol (VITAMIN D3) 250 MCG (10000 UT) capsule Take 10,000 Units by mouth daily.    . colchicine 0.6 MG tablet Take 1 tablet (0.6 mg total) by mouth 2 (two) times daily as needed (when having a gout flare). (Patient not taking: Reported on 05/22/2022) 30 tablet 0  . fluorouracil (EFUDEX) 5 % cream Apply 1 Application topically 2 (two) times daily as needed (For rash on head). (Patient not taking: Reported on 05/22/2022)    . fluticasone (FLONASE) 50 MCG/ACT nasal spray SPRAY 2 SPRAYS INTO EACH NOSTRIL EVERY DAY (Patient not  taking: Reported on 05/22/2022) 16 mL 1  . gabapentin (NEURONTIN) 300 MG capsule Take 300 mg by mouth 2 (two) times daily.    [name] ipratropium (ATROVENT) 0.03 % nasal spray Place 1-2 sprays into both nostrils 2 (two) times daily as needed (nasal drainage). (Patient taking differently: Place 1 spray into both nostrils 2 (two) times daily.) 30 mL 5  . lenalidomide (REVLIMID) 5 MG capsule TAKE 1 CAPSULE BY MOUTH DAILY  FOR 21 DAYS, THEN 7 DAYS OFF [id number] 21 capsule 0  . loperamide (IMODIUM) 2 MG capsule Take 2 mg by mouth as needed for diarrhea or loose stools.    . valsartan (DIOVAN) 160 MG tablet TAKE 1 TABLET BY MOUTH EVERY DAY (Patient taking differently: Take 160 mg by mouth daily.) 30 tablet 3  . XARELTO 10 MG TABS tablet TAKE 1 TABLET BY MOUTH EVERY DAY (Patient taking differently: Take 10 mg by mouth daily.) 90 tablet 3  . zolpidem (AMBIEN) 5 MG tablet Take 1 tablet (5 mg total) by mouth at bedtime as needed for sleep. (Patient not taking: Reported on 05/22/2022) 30 tablet 1   Current Facility-Administered Medications  Medication Dose Route Frequency Provider Last Rate Last Admin  . 0.9 %  sodium chloride infusion   Intravenous PRN Saguier, Percell Miller, PA-C       Facility-Administered Medications Ordered in Other Visits  Medication Dose Route Frequency Provider Last Rate Last Admin  . heparin lock flush 100 unit/mL  500 Units Intravenous Once Celso Amy, NP      . sodium chloride flush (NS) 0.9 % injection 3 mL  3 mL Intravenous Once PRN Celso Amy, NP       Allergies: Allergies  Allergen Reactions  . Singulair [Montelukast] Other (See Comments)    Mood change   I reviewed his past medical history, social history, family history, and environmental history and no significant changes have been reported from his previous visit.  Review of Systems  Constitutional:  Negative for appetite change, chills, fever and unexpected weight change.  HENT:  Positive for congestion, postnasal  drip and rhinorrhea.   Eyes:  Negative for itching.  Respiratory:  Positive for cough. Negative for chest tightness, shortness of breath and wheezing.   Cardiovascular:  Negative for chest pain.  Gastrointestinal:  Negative for abdominal pain.  Genitourinary:  Negative for difficulty urinating.  Skin:  Negative for rash.  Allergic/Immunologic: Positive for environmental allergies. Negative for food allergies.  Neurological:  Negative for headaches.   Objective: There were no vitals taken for this visit. There is no height or weight on file to calculate BMI. Physical Exam Vitals and nursing note reviewed.  Constitutional:      Appearance: Normal appearance. He is well-developed.  HENT:     Head: Normocephalic and atraumatic.     Right Ear: Tympanic membrane and external ear normal.     Left Ear: Tympanic membrane and external ear normal.     Nose: Nose normal.     Mouth/Throat:     Mouth: Mucous membranes are moist.     Pharynx: Oropharynx is clear.  Eyes:     Conjunctiva/sclera: Conjunctivae normal.  Cardiovascular:     Rate and Rhythm: Normal rate and regular rhythm.     Heart sounds: Normal heart sounds. No murmur heard.    No friction rub. No gallop.  Pulmonary:     Effort: Pulmonary effort is normal.     Breath sounds: Normal breath sounds. No wheezing, rhonchi or rales.  Musculoskeletal:     Cervical back: Neck supple.  Skin:    General: Skin is warm.     Findings: No rash.  Neurological:     Mental Status: He is alert and oriented to person, place, and time.  Psychiatric:        Behavior: Behavior normal.  Previous notes and tests were reviewed. The plan was reviewed with the patient/family, and all questions/concerned were addressed.  It was my pleasure to see Jorge [name] today and participate in his care. Please feel free to contact me with any questions or concerns.  Sincerely,  Rexene Alberts, DO Allergy & Immunology  [facility] office: [place] office: [phone]

## 2022-05-29 ENCOUNTER — Encounter: Payer: Self-pay | Admitting: Allergy

## 2022-05-29 ENCOUNTER — Ambulatory Visit (INDEPENDENT_AMBULATORY_CARE_PROVIDER_SITE_OTHER): Payer: Medicare Other | Admitting: Allergy

## 2022-05-29 VITALS — BP 102/70 | HR 68 | Temp 98.7°F | Resp 18 | Ht 67.5 in | Wt 184.5 lb

## 2022-05-29 DIAGNOSIS — R053 Chronic cough: Secondary | ICD-10-CM | POA: Diagnosis not present

## 2022-05-29 DIAGNOSIS — J189 Pneumonia, unspecified organism: Secondary | ICD-10-CM

## 2022-05-29 DIAGNOSIS — J3089 Other allergic rhinitis: Secondary | ICD-10-CM | POA: Diagnosis not present

## 2022-05-29 DIAGNOSIS — B999 Unspecified infectious disease: Secondary | ICD-10-CM | POA: Diagnosis not present

## 2022-05-29 NOTE — Assessment & Plan Note (Signed)
Past history - Perennial rhinitis symptoms for the last 5 years with worsening coughing in the mornings.  Tried Flonase and Astelin with good benefit.  No benefit with antihistamines.  2022 skin testing showed: Positive to perennial mold #4 and dust mites. Singulair caused mood changes. Interim history - controlled if taking all prescribed meds. Symptoms flare if misses a dose.  Use Atrovent (ipratropium) 0.03% 1 spray per nostril twice a day as needed for runny nose/drainage. Use Flonase (fluticasone) nasal spray 1 spray per nostril twice a day as needed for nasal congestion.  Nasal saline spray (i.e., Simply Saline) or nasal saline lavage (i.e., NeilMed) is recommended as needed and prior to medicated nasal sprays. Continue environmental control measures. Use over the counter antihistamines such as Zyrtec (cetirizine), Claritin (loratadine), Allegra (fexofenadine), or Xyzal (levocetirizine) daily as needed. May take twice a day during allergy flares. May switch antihistamines every few months. Consider allergy injections for long term control if above medications do not help the symptoms - handout given.

## 2022-05-29 NOTE — Patient Instructions (Addendum)
Rhinitis: 2022 skin testing showed: Positive to perennial mold #4 and dust mites.  Use Atrovent (ipratropium) 0.03% 1 spray per nostril twice a day as needed for runny nose/drainage. Use Flonase (fluticasone) nasal spray 1 spray per nostril twice a day as needed for nasal congestion.  Nasal saline spray (i.e., Simply Saline) or nasal saline lavage (i.e., NeilMed) is recommended as needed and prior to medicated nasal sprays. Continue environmental control measures. Use over the counter antihistamines such as Zyrtec (cetirizine), Claritin (loratadine), Allegra (fexofenadine), or Xyzal (levocetirizine) daily as needed. May take twice a day during allergy flares. May switch antihistamines every few months. Consider allergy injections for long term control if above medications do not help the symptoms - handout given.   Coughing:  Follows with pulmonology/ENT. Continue gabapentin as per ENT. Daily controller medication(s): none. During upper respiratory infections/asthma flares: start Symbicort 142mg 2 puffs twice a day and rinse mouth after each use for 1-2 weeks until your breathing symptoms return to baseline.  May use albuterol rescue inhaler 2 puffs every 4 to 6 hours as needed for shortness of breath, chest tightness, coughing, and wheezing. May use albuterol rescue inhaler 2 puffs 5 to 15 minutes prior to strenuous physical activities. Monitor frequency of use.  Breathing control goals:  Full participation in all desired activities (may need albuterol before activity) Albuterol use two times or less a week on average (not counting use with activity) Cough interfering with sleep two times or less a month Oral steroids no more than once a year No hospitalizations   Infections: Keep track of infections and antibiotics use. Get bloodwork We are ordering labs, so please allow 1-2 weeks for the results to come back. With the newly implemented Cures Act, the labs might be visible to you at the  same time that they become visible to me. However, I will not address the results until all of the results are back, so please be patient.  In the meantime, continue recommendations in your patient instructions, including avoidance measures (if applicable), until you hear from me.  Follow up in 6 months or sooner if needed.    Control of House Dust Mite Allergen Dust mite allergens are a common trigger of allergy and asthma symptoms. While they can be found throughout the house, these microscopic creatures thrive in warm, humid environments such as bedding, upholstered furniture and carpeting. Because so much time is spent in the bedroom, it is essential to reduce mite levels there.  Encase pillows, mattresses, and box springs in special allergen-proof fabric covers or airtight, zippered plastic covers.  Bedding should be washed weekly in hot water (130 F) and dried in a hot dryer. Allergen-proof covers are available for comforters and pillows that can't be regularly washed.  Wash the allergy-proof covers every few months. Minimize clutter in the bedroom. Keep pets out of the bedroom.  Keep humidity less than 50% by using a dehumidifier or air conditioning. You can buy a humidity measuring device called a hygrometer to monitor this.  If possible, replace carpets with hardwood, linoleum, or washable area rugs. If that's not possible, vacuum frequently with a vacuum that has a HEPA filter. Remove all upholstered furniture and non-washable window drapes from the bedroom. Remove all non-washable stuffed toys from the bedroom.  Wash stuffed toys weekly.  Mold Control Mold and fungi can grow on a variety of surfaces provided certain temperature and moisture conditions exist.  Outdoor molds grow on plants, decaying vegetation and soil. The major outdoor  mold, Alternaria and Cladosporium, are found in very high numbers during hot and dry conditions. Generally, a late summer - fall peak is seen for common  outdoor fungal spores. Rain will temporarily lower outdoor mold spore count, but counts rise rapidly when the rainy period ends. The most important indoor molds are Aspergillus and Penicillium. Dark, humid and poorly ventilated basements are ideal sites for mold growth. The next most common sites of mold growth are the bathroom and the kitchen. Outdoor (Seasonal) Mold Control Use air conditioning and keep windows closed. Avoid exposure to decaying vegetation. Avoid leaf raking. Avoid grain handling. Consider wearing a face mask if working in moldy areas.  Indoor (Perennial) Mold Control  Maintain humidity below 50%. Get rid of mold growth on hard surfaces with water, detergent and, if necessary, 5% bleach (do not mix with other cleaners). Then dry the area completely. If mold covers an area more than 10 square feet, consider hiring an indoor environmental professional. For clothing, washing with soap and water is best. If moldy items cannot be cleaned and dried, throw them away. Remove sources e.g. contaminated carpets. Repair and seal leaking roofs or pipes. Using dehumidifiers in damp basements may be helpful, but empty the water and clean units regularly to prevent mildew from forming. All rooms, especially basements, bathrooms and kitchens, require ventilation and cleaning to deter mold and mildew growth. Avoid carpeting on concrete or damp floors, and storing items in damp areas.

## 2022-05-29 NOTE — Assessment & Plan Note (Signed)
Past history - Coughing, wheezing, shortness of breath and chest tightness for 5 years.  Tried albuterol and Symbicort as needed with some benefit.  COVID-19 infection 2 months ago.  Being treated for multiple myeloma for the last 5 years.  Follows with pulmonology.  Tried PPI with no benefit. 2022 PFT showed restriction, 2022 CT chest showed atelectasis, hiatal hernia, lytic rib lesions. Interim history - used Symbicort with recent cold, had Covid-19.  Follows with pulmonology/ENT. Continue gabapentin as per ENT. Daily controller medication(s): none. During upper respiratory infections/asthma flares: start Symbicort 158mg 2 puffs twice a day and rinse mouth after each use for 1-2 weeks until your breathing symptoms return to baseline.  May use albuterol rescue inhaler 2 puffs every 4 to 6 hours as needed for shortness of breath, chest tightness, coughing, and wheezing. May use albuterol rescue inhaler 2 puffs 5 to 15 minutes prior to strenuous physical activities. Monitor frequency of use.

## 2022-05-29 NOTE — Assessment & Plan Note (Signed)
Past history - Frequent bouts of pneumonia requiring hospitalization and ? intubation in the past. Medical history significant for multiple myeloma.  Interim history - antibiotics for cellulitis, normal immunoglobulin levels in the past. Keep track of infections and antibiotics use. Get bloodwork to look at immune system.

## 2022-06-04 ENCOUNTER — Other Ambulatory Visit: Payer: Self-pay | Admitting: *Deleted

## 2022-06-04 ENCOUNTER — Other Ambulatory Visit: Payer: Self-pay | Admitting: Hematology & Oncology

## 2022-06-04 DIAGNOSIS — C9000 Multiple myeloma not having achieved remission: Secondary | ICD-10-CM

## 2022-06-04 MED ORDER — LENALIDOMIDE 5 MG PO CAPS: ORAL_CAPSULE | ORAL | 0 refills | Status: DC

## 2022-06-18 ENCOUNTER — Other Ambulatory Visit: Payer: Self-pay | Admitting: Emergency Medicine

## 2022-06-18 LAB — STREP PNEUMONIAE 23 SEROTYPES IGG
Pneumo Ab Type 1*: 1.1 ug/mL — ABNORMAL LOW (ref >1.3)
Pneumo Ab Type 12 (12F)*: 0.2 ug/mL — ABNORMAL LOW (ref >1.3)
Pneumo Ab Type 14*: 2 ug/mL (ref >1.3)
Pneumo Ab Type 17 (17F)*: 1 ug/mL — ABNORMAL LOW (ref >1.3)
Pneumo Ab Type 19 (19F)*: 6 ug/mL (ref >1.3)
Pneumo Ab Type 2*: 2 ug/mL (ref >1.3)
Pneumo Ab Type 20*: 2.9 ug/mL (ref >1.3)
Pneumo Ab Type 22 (22F)*: 1.6 ug/mL (ref >1.3)
Pneumo Ab Type 23 (23F)*: 0.8 ug/mL — ABNORMAL LOW (ref >1.3)
Pneumo Ab Type 26 (6B)*: 0.7 ug/mL — ABNORMAL LOW (ref >1.3)
Pneumo Ab Type 3*: 0.6 ug/mL — ABNORMAL LOW (ref >1.3)
Pneumo Ab Type 34 (10A)*: 0.4 ug/mL — ABNORMAL LOW (ref >1.3)
Pneumo Ab Type 4*: 0.2 ug/mL — ABNORMAL LOW (ref >1.3)
Pneumo Ab Type 43 (11A)*: 0.3 ug/mL — ABNORMAL LOW (ref >1.3)
Pneumo Ab Type 5*: 0.3 ug/mL — ABNORMAL LOW (ref >1.3)
Pneumo Ab Type 51 (7F)*: 1.8 ug/mL (ref >1.3)
Pneumo Ab Type 54 (15B)*: 0.2 ug/mL — ABNORMAL LOW (ref >1.3)
Pneumo Ab Type 56 (18C)*: 1.4 ug/mL (ref >1.3)
Pneumo Ab Type 57 (19A)*: 1.4 ug/mL (ref >1.3)
Pneumo Ab Type 68 (9V)*: 0.3 ug/mL — ABNORMAL LOW (ref >1.3)
Pneumo Ab Type 70 (33F)*: 0.5 ug/mL — ABNORMAL LOW (ref >1.3)
Pneumo Ab Type 8*: 0.5 ug/mL — ABNORMAL LOW (ref >1.3)
Pneumo Ab Type 9 (9N)*: 2 ug/mL (ref >1.3)

## 2022-06-18 LAB — DIPHTHERIA / TETANUS ANTIBODY PANEL
Diphtheria Ab: 0.81 IU/mL (ref <0.10)
Tetanus Ab, IgG: 1.28 IU/mL (ref <0.10)

## 2022-06-18 LAB — COMPLEMENT, TOTAL: Compl, Total (CH50): 56 U/mL (ref >41)

## 2022-06-20 ENCOUNTER — Other Ambulatory Visit: Payer: Self-pay | Admitting: Emergency Medicine

## 2022-06-25 ENCOUNTER — Ambulatory Visit (INDEPENDENT_AMBULATORY_CARE_PROVIDER_SITE_OTHER): Payer: Medicare Other | Admitting: Emergency Medicine

## 2022-06-25 ENCOUNTER — Encounter: Payer: Self-pay | Admitting: Emergency Medicine

## 2022-06-25 VITALS — BP 120/74 | HR 63 | Temp 97.9°F | Ht 67.0 in | Wt 189.2 lb

## 2022-06-25 DIAGNOSIS — R053 Chronic cough: Secondary | ICD-10-CM | POA: Diagnosis not present

## 2022-06-25 DIAGNOSIS — J4520 Mild intermittent asthma, uncomplicated: Secondary | ICD-10-CM

## 2022-06-25 NOTE — Assessment & Plan Note (Signed)
Overall doing well with principally upper airway cough.  He does have flaring asthma symptoms in the setting of URI.  Rare albuterol use.  Plan to keep this available.  Do not believe we need to start maintenance BD therapy or ICS for now.  Plan to follow annually

## 2022-06-25 NOTE — Assessment & Plan Note (Signed)
He is actually doing quite well as long as he keeps on top of his allergy/rhinitis regimen.  He is doing Zyrtec, Atrovent nasal spray, fluticasone nasal if needed and nasal saline rinses about 3 times a week.  He benefited from the change to valsartan from lisinopril.  He has also benefited from the addition of gabapentin.  Plan to continue these.

## 2022-06-25 NOTE — Patient Instructions (Addendum)
Continue valsartan.  We will refill this for you today. Keep albuterol available to use 2 puffs if needed for shortness of breath, chest tightness, wheezing. Continue gabapentin twice a day as you have been taking it Continue your Zyrtec and ipratropium nasal spray as you have been using them. Keep fluticasone nasal spray available to use 2 sprays each nostril daily if needed for congestion and drainage. Continue your nasal saline rinses 2-3 times weekly Please call our office for any changes in your breathing or any new respiratory symptoms. Follow with Dr. Lamonte Sakai in 12 months or sooner if you have any problems.

## 2022-06-25 NOTE — Progress Notes (Signed)
Subjective:    Patient ID: Jorge [name], male    DOB: Nov 22, 1955, 66 y.o.   MRN: [id number]  HPI 66 yo man, former tobacco (7 pk-yrs), History of multiple myeloma on therapy w transplant 2018, allergic rhinitis, GERD with a hiatal hernia, lower extremity DVT, chronic back pain.  I have seen him for chronic cough.  Pulmonary function testing done August 2022 showed significant obstruction and superimposed restriction without a bronchodilator response.  He was treated more aggressively for rhinitis (did not tolerate Singulair), had his lisinopril changed to valsartan and cough did seem to improve some he did not really respond to GERD therapy. Today he reports that he unfortunately had COVID-19 this fall.  He has been seen by Allergy and ENT and is currently on Zyrtec, Atrovent nasal spray twice daily, fluticasone nasal as needed. He does [place] about 2-3x a week.  He is also on gabapentin 300 mg twice daily and seem to be benefiting from this. Rare albuterol use.    Review of Systems As per HPI  Past Medical History:  Diagnosis Date   Allergy    Anxiety    Asthma    Blood transfusion without reported diagnosis 2018   Bone metastasis    Chest cold 05/19/2016   productive cough  -- started on antibiotic   Chronic back pain    due to bone mets from myeloma   Cough    Depression    Diverticulitis    GERD (gastroesophageal reflux disease)    Hiatal hernia    History of chicken pox    History of concussion    age 51 -- no residual   History of DVT of lower extremity 03/21/2016  treated and completed w/ xarelto   per doppler left extensive occlusion common femoral, femoral, and popliteal veins and right partial occlusion common femoral and profunda femoral veins/  last doppler 06-04-2016 no evidence acute or chronic dvt noted either leg    History of radiation therapy 06/09/16-06/23/16   lower thoracic spine 25 Gy in 10 fractions   Mouth ulcers    secondary to radiation   Multiple  myeloma ([place]) dx 02/22/2016 via bone marrow bx---  oncologist-  [name]   IgG Kappa-- Hyperdiploid/ +11 w/ bone mets--  current treatment chemotherapy (started 08/ 2017)and pallitive radiation to back started 06-09-2016   Renal calculus, right    Wears contact lenses      Family History  Problem Relation Age of Onset   Uterine cancer Mother    Ovarian cancer Mother    Colon cancer Mother        said it was rectal or colon but not sure   Rectal cancer Mother    Heart disease Father    Hypertension Father    Multiple sclerosis Sister    Paranoid behavior Brother    Drug abuse Brother    Schizophrenia Brother    Stroke Maternal Grandfather    Cancer Maternal Aunt    Leukemia Paternal Aunt    Healthy Son        x1   Healthy Daughter        x2   Allergies Daughter        x1   Diabetes Neg Hx    Alzheimer's disease Neg Hx    Parkinson's disease Neg Hx    Esophageal cancer Neg Hx    Stomach cancer Neg Hx      Social History   Socioeconomic History   Marital status:  Married    Spouse name: Not on file   Number of children: 3   Years of education: Not on file   Highest education level: Not on file  Occupational History   Not on file  Tobacco Use   Smoking status: Former    Packs/day: 1.00    Years: 7.00    Total pack years: 7.00    Types: Cigarettes    Quit date: 11/02/1980    Years since quitting: 41.6   Smokeless tobacco: Never  Vaping Use   Vaping Use: Never used  Substance and Sexual Activity   Alcohol use: Yes    Comment: occasional   Drug use: No   Sexual activity: Not Currently  Other Topics Concern   Not on file  Social History Narrative   Not on file   Social Determinants of Health   Financial Resource Strain: Low Risk  (07/02/2021)   Overall Financial Resource Strain (CARDIA)    Difficulty of Paying Living Expenses: Not hard at all  Food Insecurity: No Food Insecurity (07/02/2021)   Hunger Vital Sign    Worried About Running Out of Food in  the Last Year: Never true    Ran Out of Food in the Last Year: Never true  Transportation Needs: No Transportation Needs (07/02/2021)   PRAPARE - Hydrologist (Medical): No    Lack of Transportation (Non-Medical): No  Physical Activity: Sufficiently Active (07/02/2021)   Exercise Vital Sign    Days of Exercise per Week: 7 days    Minutes of Exercise per Session: 60 min  Stress: No Stress Concern Present (07/02/2021)   [place]    Feeling of Stress : Not at all  Social Connections: Moderately Integrated (07/02/2021)   Social Connection and Isolation Panel [NHANES]    Frequency of Communication with Friends and Family: More than three times a week    Frequency of Social Gatherings with Friends and Family: More than three times a week    Attends Religious Services: More than 4 times per year    Active Member of [employer] or Organizations: No    Attends Archivist Meetings: Never    Marital Status: Married  Human resources officer Violence: Not At Risk (07/02/2021)   Humiliation, Afraid, Rape, and Kick questionnaire    Fear of Current or Ex-Partner: No    Emotionally Abused: No    Physically Abused: No    Sexually Abused: No    From [place], [place], [place], [place] No military Some exposure to wood dusts and chemicals through work.   Allergies  Allergen Reactions   Singulair [Montelukast] Other (See Comments)    Mood change     Outpatient Medications Prior to Visit  Medication Sig Dispense Refill   acetaminophen (TYLENOL) 325 MG tablet Take 2 tablets (650 mg total) by mouth every 6 (six) hours as needed for mild pain (or Fever >/= 101).     albuterol (VENTOLIN HFA) 108 (90 Base) MCG/ACT inhaler TAKE 2 PUFFS BY MOUTH EVERY 6 HOURS AS NEEDED FOR WHEEZE OR SHORTNESS OF BREATH 6.7 each 1   allopurinol (ZYLOPRIM) 100 MG tablet Take 1 tablet (100 mg total) by mouth daily. 30 tablet 2   budesonide-formoterol  (SYMBICORT) 160-4.5 MCG/ACT inhaler TAKE 2 PUFFS BY MOUTH TWICE A DAY 10.2 each 4   Cholecalciferol (VITAMIN D3) 250 MCG (10000 UT) capsule Take 10,000 Units by mouth daily.     colchicine 0.6 MG  tablet Take 1 tablet (0.6 mg total) by mouth 2 (two) times daily as needed (when having a gout flare). 30 tablet 0   fluorouracil (EFUDEX) 5 % cream Apply 1 Application topically 2 (two) times daily as needed (For rash on head).     fluticasone (FLONASE) 50 MCG/ACT nasal spray SPRAY 2 SPRAYS INTO EACH NOSTRIL EVERY DAY 16 mL 1   gabapentin (NEURONTIN) 300 MG capsule Take 300 mg by mouth 2 (two) times daily.     ipratropium (ATROVENT) 0.03 % nasal spray Place 1-2 sprays into both nostrils 2 (two) times daily as needed (nasal drainage). (Patient taking differently: Place 1 spray into both nostrils 2 (two) times daily.) 30 mL 5   lenalidomide (REVLIMID) 5 MG capsule TAKE 1 CAPSULE BY MOUTH DAILY  FOR 21 DAYS, THEN 7 DAYS OFF Authorization number [id number] 21 capsule 0   loperamide (IMODIUM) 2 MG capsule Take 2 mg by mouth as needed for diarrhea or loose stools.     valsartan (DIOVAN) 160 MG tablet TAKE 1 TABLET BY MOUTH EVERY DAY (Patient taking differently: Take 160 mg by mouth daily.) 30 tablet 3   XARELTO 10 MG TABS tablet TAKE 1 TABLET BY MOUTH EVERY DAY (Patient taking differently: Take 10 mg by mouth daily.) 90 tablet 3   zolpidem (AMBIEN) 5 MG tablet Take 1 tablet (5 mg total) by mouth at bedtime as needed for sleep. 30 tablet 1   amLODipine (NORVASC) 5 MG tablet Take 1 tablet (5 mg total) by mouth daily. 90 tablet 3   Facility-Administered Medications Prior to Visit  Medication Dose Route Frequency Provider Last Rate Last Admin   0.9 %  sodium chloride infusion   Intravenous PRN Saguier, Percell Miller, PA-C       heparin lock flush 100 unit/mL  500 Units Intravenous Once Celso Amy, NP       sodium chloride flush (NS) 0.9 % injection 3 mL  3 mL Intravenous Once PRN Celso Amy, NP             Objective:   Physical Exam  Vitals:   06/25/22 1601  BP: 120/74  Pulse: 63  Temp: 97.9 F (36.6 C)  TempSrc: Oral  SpO2: 97%  Weight: 189 lb 3.2 oz (85.8 kg)  Height: 5' 7" (1.702 m)   Gen: Pleasant, well-nourished, in no distress,  normal affect  ENT: No lesions,  mouth clear,  oropharynx clear, no postnasal drip  Neck: No JVD, no stridor  Lungs: No use of accessory muscles, no crackles or wheezing on normal respiration, no wheeze on forced expiration  Cardiovascular: RRR, heart sounds normal, no murmur or gallops, no peripheral edema  Musculoskeletal: No deformities, no cyanosis or clubbing  Neuro: alert, awake, non focal  Skin: Warm, no lesions or rash     Assessment & Plan:  Chronic cough He is actually doing quite well as long as he keeps on top of his allergy/rhinitis regimen.  He is doing Zyrtec, Atrovent nasal spray, fluticasone nasal if needed and nasal saline rinses about 3 times a week.  He benefited from the change to valsartan from lisinopril.  He has also benefited from the addition of gabapentin.  Plan to continue these.  Mild intermittent asthma Overall doing well with principally upper airway cough.  He does have flaring asthma symptoms in the setting of URI.  Rare albuterol use.  Plan to keep this available.  Do not believe we need to start maintenance BD therapy or ICS for  now.  Plan to follow annually   Baltazar Apo, MD, PhD 06/25/2022, 4:18 PM [facility] [phone] or if no answer before 7:00PM call [phone] For any issues after 7:00PM please call eLink [phone]

## 2022-06-26 MED ORDER — VALSARTAN 160 MG PO TABS: 160.0000 mg | ORAL_TABLET | Freq: Every day | ORAL | 11 refills | Status: DC

## 2022-06-26 NOTE — Addendum Note (Signed)
Addended by: Gavin Potters R on: 06/26/2022 01:19 PM   Modules accepted: Orders

## 2022-06-29 ENCOUNTER — Other Ambulatory Visit: Payer: Self-pay | Admitting: Allergy

## 2022-07-03 ENCOUNTER — Other Ambulatory Visit: Payer: Self-pay | Admitting: *Deleted

## 2022-07-03 DIAGNOSIS — C9000 Multiple myeloma not having achieved remission: Secondary | ICD-10-CM

## 2022-07-03 MED ORDER — LENALIDOMIDE 5 MG PO CAPS: ORAL_CAPSULE | ORAL | 0 refills | Status: DC

## 2022-07-16 ENCOUNTER — Telehealth: Payer: Self-pay | Admitting: Allergy

## 2022-07-16 DIAGNOSIS — B999 Unspecified infectious disease: Secondary | ICD-10-CM

## 2022-07-16 NOTE — Telephone Encounter (Signed)
Called and spoke with the patient and advised that he can have labs redrawn at the earliest 07/24/22 and that I would mail out the requisition to his home. Patient verbalized understanding. Lab has been ordered and requisition mailed to the patients home.

## 2022-07-16 NOTE — Telephone Encounter (Signed)
Jorge [name] called in and stated he received his pneumonia vaccine on 06/26/2022.  Jorge [name] was wanting to know if the orders to get the blood work can be put it so he can get his blood drawn.  Please advise.

## 2022-08-01 ENCOUNTER — Telehealth: Payer: Self-pay | Admitting: Medical

## 2022-08-01 NOTE — Telephone Encounter (Signed)
Copied from [place] [phone] [id number]. Topic: Medicare AWV >> Aug 01, 2022 10:38 AM Devoria Glassing wrote: Reason for CRM: Left message for patient to schedule Annual Wellness Visit.  Please schedule with Health Nurse Advisor at [facility]. Call [place] at [phone]

## 2022-08-02 LAB — STREP PNEUMONIAE 23 SEROTYPES IGG
Pneumo Ab Type 1*: 3.1 ug/mL (ref >1.3)
Pneumo Ab Type 12 (12F)*: 0.3 ug/mL — ABNORMAL LOW (ref >1.3)
Pneumo Ab Type 14*: 2.4 ug/mL (ref >1.3)
Pneumo Ab Type 17 (17F)*: 1.1 ug/mL — ABNORMAL LOW (ref >1.3)
Pneumo Ab Type 19 (19F)*: 3.8 ug/mL (ref >1.3)
Pneumo Ab Type 2*: 4.7 ug/mL (ref >1.3)
Pneumo Ab Type 20*: 8.9 ug/mL (ref >1.3)
Pneumo Ab Type 22 (22F)*: >26 ug/mL (ref >1.3)
Pneumo Ab Type 23 (23F)*: 3.5 ug/mL (ref >1.3)
Pneumo Ab Type 26 (6B)*: 8.5 ug/mL (ref >1.3)
Pneumo Ab Type 3*: 6.7 ug/mL (ref >1.3)
Pneumo Ab Type 34 (10A)*: 19.1 ug/mL (ref >1.3)
Pneumo Ab Type 4*: 0.7 ug/mL — ABNORMAL LOW (ref >1.3)
Pneumo Ab Type 43 (11A)*: 1.6 ug/mL (ref >1.3)
Pneumo Ab Type 5*: 0.5 ug/mL — ABNORMAL LOW (ref >1.3)
Pneumo Ab Type 51 (7F)*: >13.6 ug/mL (ref >1.3)
Pneumo Ab Type 54 (15B)*: 1.1 ug/mL — ABNORMAL LOW (ref >1.3)
Pneumo Ab Type 56 (18C)*: >8.1 ug/mL (ref >1.3)
Pneumo Ab Type 57 (19A)*: 4 ug/mL (ref >1.3)
Pneumo Ab Type 68 (9V)*: 0.7 ug/mL — ABNORMAL LOW (ref >1.3)
Pneumo Ab Type 70 (33F)*: 1.2 ug/mL — ABNORMAL LOW (ref >1.3)
Pneumo Ab Type 8*: 6.4 ug/mL (ref >1.3)
Pneumo Ab Type 9 (9N)*: 2.4 ug/mL (ref >1.3)

## 2022-08-03 ENCOUNTER — Other Ambulatory Visit: Payer: Self-pay | Admitting: Hematology & Oncology

## 2022-08-03 DIAGNOSIS — C9000 Multiple myeloma not having achieved remission: Secondary | ICD-10-CM

## 2022-08-05 ENCOUNTER — Telehealth: Payer: Self-pay

## 2022-08-05 NOTE — Telephone Encounter (Signed)
Lenalodomine celgene auth # [id number] faxed 1/23

## 2022-08-10 ENCOUNTER — Other Ambulatory Visit: Payer: Self-pay | Admitting: Medical

## 2022-08-10 DIAGNOSIS — L03032 Cellulitis of left toe: Secondary | ICD-10-CM

## 2022-08-14 ENCOUNTER — Other Ambulatory Visit: Payer: Self-pay | Admitting: Medical

## 2022-08-21 ENCOUNTER — Ambulatory Visit (INDEPENDENT_AMBULATORY_CARE_PROVIDER_SITE_OTHER): Payer: Medicare Other | Admitting: *Deleted

## 2022-08-21 DIAGNOSIS — Z0000 Encounter for general adult medical examination without abnormal findings: Secondary | ICD-10-CM

## 2022-08-21 NOTE — Progress Notes (Addendum)
Subjective:   Jorge [name] is a 67 y.o. male who presents for Medicare Annual/Subsequent preventive examination.  I connected with  Norval Slaven on 08/21/22 by a audio enabled telemedicine application and verified that I am speaking with the correct person using two identifiers.  Patient Location: Home  Provider Location: Office/Clinic  I discussed the limitations of evaluation and management by telemedicine. The patient expressed understanding and agreed to proceed.   Review of Systems    Defer to PCP Cardiac Risk Factors include: advanced age (>21mn, >>42women);male gender;hypertension     Objective:    There were no vitals filed for this visit. There is no height or weight on file to calculate BMI.     08/21/2022    1:01 PM 05/22/2022    1:56 PM 02/20/2022   12:15 PM 02/12/2022    2:04 PM 02/12/2022   10:17 AM 02/07/2022    8:26 AM 12/06/2021    1:47 PM  Advanced Directives  Does Patient Have a Medical Advance Directive? Yes Yes Yes Yes Yes Yes Yes  Type of AParamedicof [place]art[place]Living will [place]Living will HE[place]oraLiving will HPine Island CenterLiving will [place]renadaLiving will  [place]atellite BeachLiving will  Does patient want to make changes to medical advance directive? No - Patient declined   No - Patient declined     Copy of H[place]in Chart? Yes - validated most recent copy scanned in chart (See row information) No - copy requested No - copy requested No - copy requested No - copy requested      Current Medications (verified) Outpatient Encounter Medications as of 08/21/2022  Medication Sig   acetaminophen (TYLENOL) 325 MG tablet Take 2 tablets (650 mg total) by mouth every 6 (six) hours as needed for mild pain (or Fever >/= 101).   albuterol (VENTOLIN HFA) 108 (90 Base) MCG/ACT inhaler TAKE 2 PUFFS BY MOUTH EVERY 6 HOURS AS NEEDED FOR  WHEEZE OR SHORTNESS OF BREATH   allopurinol (ZYLOPRIM) 100 MG tablet TAKE 1 TABLET BY MOUTH EVERY DAY   amLODipine (NORVASC) 5 MG tablet TAKE 1 TABLET (5 MG TOTAL) BY MOUTH DAILY.   budesonide-formoterol (SYMBICORT) 160-4.5 MCG/ACT inhaler TAKE 2 PUFFS BY MOUTH TWICE A DAY   Cholecalciferol (VITAMIN D3) 250 MCG (10000 UT) capsule Take 10,000 Units by mouth daily.   colchicine 0.6 MG tablet Take 1 tablet (0.6 mg total) by mouth 2 (two) times daily as needed (when having a gout flare).   fluorouracil (EFUDEX) 5 % cream Apply 1 Application topically 2 (two) times daily as needed (For rash on head).   fluticasone (FLONASE) 50 MCG/ACT nasal spray SPRAY 2 SPRAYS INTO EACH NOSTRIL EVERY DAY   gabapentin (NEURONTIN) 300 MG capsule Take 300 mg by mouth 2 (two) times daily.   ipratropium (ATROVENT) 0.03 % nasal spray PLACE 1-2 SPRAYS INTO BOTH NOSTRILS 2 (TWO) TIMES DAILY AS NEEDED (NASAL DRAINAGE).   lenalidomide (REVLIMID) 5 MG capsule TAKE 1 CAPSULE BY MOUTH DAILY  FOR 21 DAYS, THEN 7 DAYS OFF   loperamide (IMODIUM) 2 MG capsule Take 2 mg by mouth as needed for diarrhea or loose stools.   valsartan (DIOVAN) 160 MG tablet Take 1 tablet (160 mg total) by mouth daily.   XARELTO 10 MG TABS tablet TAKE 1 TABLET BY MOUTH EVERY DAY (Patient taking differently: Take 10 mg by mouth daily.)   zolpidem (AMBIEN) 5 MG tablet Take  1 tablet (5 mg total) by mouth at bedtime as needed for sleep.   Facility-Administered Encounter Medications as of 08/21/2022  Medication   0.9 %  sodium chloride infusion   heparin lock flush 100 unit/mL   sodium chloride flush (NS) 0.9 % injection 3 mL    Allergies (verified) Singulair [montelukast]   History: Past Medical History:  Diagnosis Date   Allergy    Anxiety    Asthma    Blood transfusion without reported diagnosis 2018   Bone metastasis    Chest cold 05/19/2016   productive cough  -- started on antibiotic   Chronic back pain    due to bone mets from myeloma    Cough    Depression    Diverticulitis    GERD (gastroesophageal reflux disease)    Hiatal hernia    History of chicken pox    History of concussion    age 35 -- no residual   History of DVT of lower extremity 03/21/2016  treated and completed w/ xarelto   per doppler left extensive occlusion common femoral, femoral, and popliteal veins and right partial occlusion common femoral and profunda femoral veins/  last doppler 06-04-2016 no evidence acute or chronic dvt noted either leg    History of radiation therapy 06/09/16-06/23/16   lower thoracic spine 25 Gy in 10 fractions   Mouth ulcers    secondary to radiation   Multiple myeloma ([facility]) dx 02/22/2016 via bone marrow bx---  oncologist-  [name]   IgG Kappa-- Hyperdiploid/ +11 w/ bone mets--  current treatment chemotherapy (started 08/ 2017)and pallitive radiation to back started 06-09-2016   Renal calculus, right    Wears contact lenses    Past Surgical History:  Procedure Laterality Date   COLONOSCOPY  2008,2013   COLONOSCOPY  01/07/2017   Dr Penelope Coop, [facility]   CYSTOSCOPY W/ URETERAL STENT PLACEMENT Right 06/20/2016   Procedure: CYSTOSCOPY WITH STENT REPLACEMENT;  Surgeon: Kathie Rhodes, MD;  Location: [facility];  Service: Urology;  Laterality: Right;   CYSTOSCOPY WITH RETROGRADE PYELOGRAM, URETEROSCOPY AND STENT PLACEMENT Right 05/30/2016   Procedure: CYSTOSCOPY WITH RETROGRADE PYELOGRAM, URETEROSCOPY AND STENT PLACEMENT,DILITATION URETERAL STRICTURE;  Surgeon: Kathie Rhodes, MD;  Location: [facility];  Service: Urology;  Laterality: Right;   CYSTOSCOPY/RETROGRADE/URETEROSCOPY/STONE EXTRACTION WITH BASKET Right 06/20/2016   Procedure: CYSTOSCOPY/URETEROSCOPY/STONE EXTRACTION WITH BASKET;  Surgeon: Kathie Rhodes, MD;  Location[facility];  Service: Urology;  Laterality: Right;   HIATAL HERNIA REPAIR N/A 08/13/2018   Procedure: LAPAROSCOPIC REPAIR OF HIATAL HERNIA REPAIR WITH FUNDOPLICATION AND INSERTION OF MESH;   Surgeon: Excell Seltzer, MD;  Location: [facility];  Service: General;  Laterality: N/A;   HOLMIUM LASER APPLICATION Right 16/07/958   Procedure: HOLMIUM LASER APPLICATION;  Surgeon: Kathie Rhodes, MD;  Location: [facility];  Service: Urology;  Laterality: Right;   IR GENERIC HISTORICAL  02/11/2016   IR RADIOLOGIST EVAL & MGMT 02/11/2016 [facility]   IR GENERIC HISTORICAL  02/15/2016   IR BONE TUMOR(S)RF ABLATION 02/15/2016 Luanne Bras, MD [facility]   IR GENERIC HISTORICAL  02/15/2016   IR BONE TUMOR(S)RF ABLATION 02/15/2016 Luanne Bras, MD [facility]   IR GENERIC HISTORICAL  02/15/2016   IR BONE TUMOR(S)RF ABLATION 02/15/2016 Luanne Bras, MD [facility]   IR GENERIC HISTORICAL  02/15/2016   IR KYPHO THORACIC WITH BONE BIOPSY 02/15/2016 Luanne Bras, MD [facility]   IR GENERIC HISTORICAL  02/15/2016   IR KYPHO THORACIC WITH BONE BIOPSY  02/15/2016 Luanne Bras, MD [facility]   IR GENERIC HISTORICAL  02/15/2016   IR VERTEBROPLASTY CERV/THOR BX INC UNI/BIL INC/INJECT/IMAGING 02/15/2016 Luanne Bras, MD [facility]   IR GENERIC HISTORICAL  03/13/2016   IR KYPHO EA ADDL LEVEL THORACIC OR LUMBAR 03/13/2016 Luanne Bras, MD [facility]   IR GENERIC HISTORICAL  03/13/2016   IR KYPHO EA ADDL LEVEL THORACIC OR LUMBAR 03/13/2016 Luanne Bras, MD [facility]   IR GENERIC HISTORICAL  03/13/2016   IR BONE TUMOR(S)RF ABLATION 03/13/2016 Luanne Bras, MD [facility]   IR GENERIC HISTORICAL  03/13/2016   IR KYPHO LUMBAR INC FX REDUCE BONE BX UNI/BIL CANNULATION INC/IMAGING 03/13/2016 Luanne Bras, MD [facility]   IR GENERIC HISTORICAL  03/13/2016   IR BONE TUMOR(S)RF ABLATION 03/13/2016 Luanne Bras, MD [facility]   IR GENERIC HISTORICAL  03/13/2016   IR BONE TUMOR(S)RF ABLATION 03/13/2016 Luanne Bras, MD [facility]   IR GENERIC HISTORICAL  03/31/2016   IR RADIOLOGIST EVAL & MGMT 03/31/2016 [facility]   KYPHOPLASTY      02/2016, 03/2016, 11/2016   LAPAROSCOPIC INGUINAL HERNIA REPAIR Bilateral 12-16-2013  dr gross   RADIOLOGY WITH ANESTHESIA N/A 02/15/2016   Procedure: Spinal Ablation;  Surgeon: Luanne Bras, MD;  Location: [place];  Service: Radiology;  Laterality: N/A;   RADIOLOGY WITH ANESTHESIA N/A 03/13/2016   Procedure: LUMBER ABLATION;  Surgeon: Luanne Bras, MD;  Location: [place];  Service: Radiology;  Laterality: N/A;   ROTATOR CUFF REPAIR Right 2003   spine operation     x1 in 2018 x3 in 2017   TONSILLECTOMY  age 39   VERTEBROPLASTY  01/2016   WISDOM TOOTH EXTRACTION     Family History  Problem Relation Age of Onset   Uterine cancer Mother    Ovarian cancer Mother    Colon cancer Mother        said it was rectal or colon but not sure   Rectal cancer Mother    Heart disease Father    Hypertension Father    Multiple sclerosis Sister    Paranoid behavior Brother    Drug abuse Brother    Schizophrenia Brother    Stroke Maternal Grandfather    Cancer Maternal Aunt    Leukemia Paternal Aunt    Healthy Son        x1   Healthy Daughter        x2   Allergies Daughter        x1   Diabetes Neg Hx    Alzheimer's disease Neg Hx    Parkinson's disease Neg Hx    Esophageal cancer Neg Hx    Stomach cancer Neg Hx    Social History   Socioeconomic History   Marital status: Married    Spouse name: Not on file   Number of children: 3   Years of education: Not on file   Highest education level: Not on file  Occupational History   Not on file  Tobacco Use   Smoking status: Former    Packs/day: 1.00    Years: 7.00    Total pack years: 7.00    Types: Cigarettes    Quit date: 11/02/1980    Years since quitting: 41.8   Smokeless tobacco: Never  Vaping Use   Vaping Use: Never used  Substance and Sexual Activity   Alcohol use: Yes    Comment: occasional   Drug use: No   Sexual activity: Not Currently  Other Topics Concern  Not on file  Social History Narrative   Not on file    Social Determinants of Health   Financial Resource Strain: Low Risk  (08/21/2022)   Overall Financial Resource Strain (CARDIA)    Difficulty of Paying Living Expenses: Not hard at all  Food Insecurity: No Food Insecurity (08/21/2022)   Hunger Vital Sign    Worried About Running Out of Food in the Last Year: Never true    Ran Out of Food in the Last Year: Never true  Transportation Needs: No Transportation Needs (08/21/2022)   PRAPARE - Hydrologist (Medical): No    Lack of Transportation (Non-Medical): No  Physical Activity: Sufficiently Active (07/02/2021)   Exercise Vital Sign    Days of Exercise per Week: 7 days    Minutes of Exercise per Session: 60 min  Stress: No Stress Concern Present (08/21/2022)   [place]    Feeling of Stress : Not at all  Social Connections: Moderately Integrated (08/21/2022)   Social Connection and Isolation Panel [NHANES]    Frequency of Communication with Friends and Family: Three times a week    Frequency of Social Gatherings with Friends and Family: Twice a week    Attends Religious Services: Never    Marine scientist or Organizations: Yes    Attends Music therapist: 1 to 4 times per year    Marital Status: Married    Tobacco Counseling Counseling given: Not Answered   Clinical Intake:  Pre-visit preparation completed: Yes  Pain : No/denies pain  Diabetes: No  How often do you need to have someone help you when you read instructions, pamphlets, or other written materials from your doctor or pharmacy?: 1 - Never Activities of Daily Living    08/21/2022    1:06 PM 02/12/2022    2:04 PM  In your present state of health, do you have any difficulty performing the following activities:  Hearing? 0 0  Vision? 0 0  Difficulty concentrating or making decisions? 1 0  Comment slight loss of concentration   Walking or climbing stairs? 0 0   Dressing or bathing? 0 0  Doing errands, shopping? 0 1  Preparing Food and eating ? N   Using the Toilet? N   In the past six months, have you accidently leaked urine? N   Do you have problems with loss of bowel control? N   Managing your Medications? N   Managing your Finances? N   Housekeeping or managing your Housekeeping? N     Patient Care Team: Saguier, Iris Pert as PCP - General (Internal Medicine) Corine Shelter, PA-C as Physician Assistant (Physician Assistant)  Indicate any recent Medical Services you may have received from other than [facility] providers in the past year (date may be approximate).     Assessment:   This is a routine wellness examination for [place].  Hearing/Vision screen No results found.  Dietary issues and exercise activities discussed: Current Exercise Habits: Home exercise routine, Type of exercise: strength training/weights;walking, Time (Minutes): > 60 (90 min), Frequency (Times/Week): 3, Weekly Exercise (Minutes/Week): 0, Intensity: Mild, Exercise limited by: None identified   Goals Addressed   None    Depression Screen    08/21/2022    1:05 PM 02/21/2022    2:08 PM 10/24/2021   11:52 AM 07/02/2021    2:21 PM 01/09/2017    1:22 PM 06/02/2016    7:36 AM  PHQ 2/9 Scores  PHQ - 2 Score 0 0 0 0 0 2  PHQ- 9 Score  0   0     Fall Risk    08/21/2022    1:01 PM 02/21/2022    2:08 PM 10/24/2021   11:52 AM 07/02/2021    2:19 PM 08/04/2016   12:06 PM  Fall Risk   Falls in the past year? 1 0 0 1 No  Comment tripped over curb      Number falls in past yr: 0 0 0 0   Injury with Fall? 0 0 0 0   Risk for fall due to : No Fall Risks   History of fall(s)   Follow up Falls evaluation completed  Falls evaluation completed Falls prevention discussed     FALL RISK PREVENTION PERTAINING TO THE HOME:  Any stairs in or around the home? Yes  If so, are there any without handrails? No  Home free of loose throw rugs in walkways, pet beds, electrical cords,  etc? Yes  Adequate lighting in your home to reduce risk of falls? Yes   ASSISTIVE DEVICES UTILIZED TO PREVENT FALLS:  Life alert? No  Use of a cane, walker or w/c? No  Grab bars in the bathroom? Yes  Shower chair or bench in shower?  Built in bench Elevated toilet seat or a handicapped toilet? No   TIMED UP AND GO:  Was the test performed?  No, audio visit .   Cognitive Function:        08/21/2022    1:10 PM  6CIT Screen  What Year? 0 points  What month? 0 points  What time? 0 points  Count back from 20 0 points  Months in reverse 0 points  Repeat phrase 0 points  Total Score 0 points    Immunizations Immunization History  Administered Date(s) Administered   DTaP / HiB / IPV 08/19/2017, 10/14/2017, 02/24/2018   Hepatitis B, adult 08/19/2017, 10/14/2017, 02/24/2018   Influenza, High Dose Seasonal PF 03/28/2021   Influenza,inj,Quad PF,6+ Mos 04/11/2016, 04/07/2017, 04/20/2018, 03/29/2019   Influenza-Unspecified 04/11/2016, 04/07/2017, 04/20/2018, 04/27/2018, 03/29/2019, 05/07/2020, 03/28/2022   Moderna Sars-Covid-2 Vaccination 08/16/2020   Pfizer Covid-19 Vaccine Bivalent Booster 76yr & up 03/28/2021, 11/12/2021   Pneumococcal Conjugate-13 02/18/2017, 08/19/2017, 10/14/2017, 02/24/2018   Pneumococcal Polysaccharide-23 08/30/2018   Respiratory Syncytial Virus Vaccine,Recomb Aduvanted(Arexvy) 03/31/2022   Zoster Recombinat (Shingrix) 01/05/2017, 04/07/2017    TDAP status: Up to date  Flu Vaccine status: Up to date  Pneumococcal vaccine status: Up to date  Covid-19 vaccine status: Information provided on how to obtain vaccines.   Qualifies for Shingles Vaccine? Yes   Zostavax completed No   Shingrix Completed?: Yes  Screening Tests Health Maintenance  Topic Date Due   Hepatitis C Screening  Never done   Medicare Annual Wellness (AWV)  07/02/2022   Pneumonia Vaccine 67 Years old (353of 3 - PPSV23 or PCV20) 08/31/2023   COLONOSCOPY (Pts 45-456yrInsurance  coverage will need to be confirmed)  01/08/2027   DTaP/Tdap/Td (4 - Tdap) 02/25/2028   INFLUENZA VACCINE  Completed   Zoster Vaccines- Shingrix  Completed   HPV VACCINES  Aged Out   COVID-19 Vaccine  Discontinued    Health Maintenance  Health Maintenance Due  Topic Date Due   Hepatitis C Screening  Never done   Medicare Annual Wellness (AWV)  07/02/2022    Colorectal cancer screening: Type of screening: Colonoscopy. Completed 01/07/17. Repeat every 10 years  Lung Cancer Screening: (Low  Dose CT Chest recommended if Age 20-80 years, 30 pack-year currently smoking OR have quit w/in 15years.) does not qualify.   Additional Screening:  Hepatitis C Screening: does qualify; Completed N/a  Vision Screening: Recommended annual ophthalmology exams for early detection of glaucoma and other disorders of the eye. Is the patient up to date with their annual eye exam?  Yes  Who is the provider or what is the name of the office in which the patient attends annual eye exams? Doesn't remember name If pt is not established with a provider, would they like to be referred to a provider to establish care? No .   Dental Screening: Recommended annual dental exams for proper oral hygiene  Community Resource Referral / Chronic Care Management: CRR required this visit?  No   CCM required this visit?  No      Plan:     I have personally reviewed and noted the following in the patient's chart:   Medical and social history Use of alcohol, tobacco or illicit drugs  Current medications and supplements including opioid prescriptions. Patient is not currently taking opioid prescriptions. Functional ability and status Nutritional status Physical activity Advanced directives List of other physicians Hospitalizations, surgeries, and ER visits in previous 12 months Vitals Screenings to include cognitive, depression, and falls Referrals and appointments  In addition, I have reviewed and discussed  with patient certain preventive protocols, quality metrics, and best practice recommendations. A written personalized care plan for preventive services as well as general preventive health recommendations were provided to patient.   Due to this being a telephonic visit, the after visit summary with patients personalized plan was offered to patient via mail or my-chart. Patient would like to access on my-chart.   Beatris Ship, [place]   08/21/2022   Nurse Notes: None

## 2022-08-21 NOTE — Patient Instructions (Signed)
Jorge [name] , Thank you for taking time to come for your Medicare Wellness Visit. I appreciate your ongoing commitment to your health goals. Please review the following plan we discussed and let me know if I can assist you in the future.   These are the goals we discussed:  Goals      Patient Stated     Drink more water & continue walking & add more stretching        This is a list of the screening recommended for you and due dates:  Health Maintenance  Topic Date Due   Hepatitis C Screening: USPSTF Recommendation to screen - Ages 72-79 yo.  Never done   Medicare Annual Wellness Visit  08/22/2023   Pneumonia Vaccine (3 of 3 - PPSV23 or PCV20) 08/31/2023   Colon Cancer Screening  01/08/2027   DTaP/Tdap/Td vaccine (4 - Tdap) 02/25/2028   Flu Shot  Completed   Zoster (Shingles) Vaccine  Completed   HPV Vaccine  Aged Out   COVID-19 Vaccine  Discontinued     Next appointment: Follow up in one year for your annual wellness visit.   Preventive Care 19 Years and Older, Male Preventive care refers to lifestyle choices and visits with your health care provider that can promote health and wellness. What does preventive care include? A yearly physical exam. This is also called an annual well check. Dental exams once or twice a year. Routine eye exams. Ask your health care provider how often you should have your eyes checked. Personal lifestyle choices, including: Daily care of your teeth and gums. Regular physical activity. Eating a healthy diet. Avoiding tobacco and drug use. Limiting alcohol use. Practicing safe sex. Taking low doses of aspirin every day. Taking vitamin and mineral supplements as recommended by your health care provider. What happens during an annual well check? The services and screenings done by your health care provider during your annual well check will depend on your age, overall health, lifestyle risk factors, and family history of disease. Counseling  Your  health care provider may ask you questions about your: Alcohol use. Tobacco use. Drug use. Emotional well-being. Home and relationship well-being. Sexual activity. Eating habits. History of falls. Memory and ability to understand (cognition). Work and work Statistician. Screening  You may have the following tests or measurements: Height, weight, and BMI. Blood pressure. Lipid and cholesterol levels. These may be checked every 5 years, or more frequently if you are over 57 years old. Skin check. Lung cancer screening. You may have this screening every year starting at age 7 if you have a 30-pack-year history of smoking and currently smoke or have quit within the past 15 years. Fecal occult blood test (FOBT) of the stool. You may have this test every year starting at age 36. Flexible sigmoidoscopy or colonoscopy. You may have a sigmoidoscopy every 5 years or a colonoscopy every 10 years starting at age 64. Prostate cancer screening. Recommendations will vary depending on your family history and other risks. Hepatitis C blood test. Hepatitis B blood test. Sexually transmitted disease (STD) testing. Diabetes screening. This is done by checking your blood sugar (glucose) after you have not eaten for a while (fasting). You may have this done every 1-3 years. Abdominal aortic aneurysm (AAA) screening. You may need this if you are a current or former smoker. Osteoporosis. You may be screened starting at age 16 if you are at high risk. Talk with your health care provider about your test results, treatment  options, and if necessary, the need for more tests. Vaccines  Your health care provider may recommend certain vaccines, such as: Influenza vaccine. This is recommended every year. Tetanus, diphtheria, and acellular pertussis (Tdap, Td) vaccine. You may need a Td booster every 10 years. Zoster vaccine. You may need this after age 43. Pneumococcal 13-valent conjugate (PCV13) vaccine. One dose  is recommended after age 63. Pneumococcal polysaccharide (PPSV23) vaccine. One dose is recommended after age 68. Talk to your health care provider about which screenings and vaccines you need and how often you need them. This information is not intended to replace advice given to you by your health care provider. Make sure you discuss any questions you have with your health care provider. Document Released: 07/27/2015 Document Revised: 03/19/2016 Document Reviewed: 05/01/2015 Elsevier Interactive Patient Education  2017 [employer] Prevention in the Home Falls can cause injuries. They can happen to people of all ages. There are many things you can do to make your home safe and to help prevent falls. What can I do on the outside of my home? Regularly fix the edges of walkways and driveways and fix any cracks. Remove anything that might make you trip as you walk through a door, such as a raised step or threshold. Trim any bushes or trees on the path to your home. Use bright outdoor lighting. Clear any walking paths of anything that might make someone trip, such as rocks or tools. Regularly check to see if handrails are loose or broken. Make sure that both sides of any steps have handrails. Any raised decks and porches should have guardrails on the edges. Have any leaves, snow, or ice cleared regularly. Use sand or salt on walking paths during winter. Clean up any spills in your garage right away. This includes oil or grease spills. What can I do in the bathroom? Use night lights. Install grab bars by the toilet and in the tub and shower. Do not use towel bars as grab bars. Use non-skid mats or decals in the tub or shower. If you need to sit down in the shower, use a plastic, non-slip stool. Keep the floor dry. Clean up any water that spills on the floor as soon as it happens. Remove soap buildup in the tub or shower regularly. Attach bath mats securely with double-sided non-slip rug  tape. Do not have throw rugs and other things on the floor that can make you trip. What can I do in the bedroom? Use night lights. Make sure that you have a light by your bed that is easy to reach. Do not use any sheets or blankets that are too big for your bed. They should not hang down onto the floor. Have a firm chair that has side arms. You can use this for support while you get dressed. Do not have throw rugs and other things on the floor that can make you trip. What can I do in the kitchen? Clean up any spills right away. Avoid walking on wet floors. Keep items that you use a lot in easy-to-reach places. If you need to reach something above you, use a strong step stool that has a grab bar. Keep electrical cords out of the way. Do not use floor polish or wax that makes floors slippery. If you must use wax, use non-skid floor wax. Do not have throw rugs and other things on the floor that can make you trip. What can I do with my stairs? Do not  leave any items on the stairs. Make sure that there are handrails on both sides of the stairs and use them. Fix handrails that are broken or loose. Make sure that handrails are as long as the stairways. Check any carpeting to make sure that it is firmly attached to the stairs. Fix any carpet that is loose or worn. Avoid having throw rugs at the top or bottom of the stairs. If you do have throw rugs, attach them to the floor with carpet tape. Make sure that you have a light switch at the top of the stairs and the bottom of the stairs. If you do not have them, ask someone to add them for you. What else can I do to help prevent falls? Wear shoes that: Do not have high heels. Have rubber bottoms. Are comfortable and fit you well. Are closed at the toe. Do not wear sandals. If you use a stepladder: Make sure that it is fully opened. Do not climb a closed stepladder. Make sure that both sides of the stepladder are locked into place. Ask someone to  hold it for you, if possible. Clearly mark and make sure that you can see: Any grab bars or handrails. First and last steps. Where the edge of each step is. Use tools that help you move around (mobility aids) if they are needed. These include: Canes. Walkers. Scooters. Crutches. Turn on the lights when you go into a dark area. Replace any light bulbs as soon as they burn out. Set up your furniture so you have a clear path. Avoid moving your furniture around. If any of your floors are uneven, fix them. If there are any pets around you, be aware of where they are. Review your medicines with your doctor. Some medicines can make you feel dizzy. This can increase your chance of falling. Ask your doctor what other things that you can do to help prevent falls. This information is not intended to replace advice given to you by your health care provider. Make sure you discuss any questions you have with your health care provider. Document Released: 04/26/2009 Document Revised: 12/06/2015 Document Reviewed: 08/04/2014 Elsevier Interactive Patient Education  2017 [employer].

## 2022-08-22 ENCOUNTER — Inpatient Hospital Stay: Payer: Medicare Other

## 2022-08-22 ENCOUNTER — Encounter: Payer: Self-pay | Admitting: Hematology & Oncology

## 2022-08-22 ENCOUNTER — Inpatient Hospital Stay (HOSPITAL_BASED_OUTPATIENT_CLINIC_OR_DEPARTMENT_OTHER): Payer: Medicare Other | Admitting: Hematology & Oncology

## 2022-08-22 ENCOUNTER — Inpatient Hospital Stay: Payer: Medicare Other | Attending: Hematology & Oncology

## 2022-08-22 VITALS — BP 136/83 | HR 57 | Temp 97.9°F | Resp 20 | Ht 67.0 in | Wt 190.0 lb

## 2022-08-22 DIAGNOSIS — Z9484 Stem cells transplant status: Secondary | ICD-10-CM | POA: Insufficient documentation

## 2022-08-22 DIAGNOSIS — R7401 Elevation of levels of liver transaminase levels: Secondary | ICD-10-CM | POA: Diagnosis not present

## 2022-08-22 DIAGNOSIS — Z7901 Long term (current) use of anticoagulants: Secondary | ICD-10-CM | POA: Insufficient documentation

## 2022-08-22 DIAGNOSIS — C9000 Multiple myeloma not having achieved remission: Secondary | ICD-10-CM | POA: Diagnosis present

## 2022-08-22 DIAGNOSIS — Z79899 Other long term (current) drug therapy: Secondary | ICD-10-CM | POA: Insufficient documentation

## 2022-08-22 DIAGNOSIS — D509 Iron deficiency anemia, unspecified: Secondary | ICD-10-CM | POA: Diagnosis not present

## 2022-08-22 DIAGNOSIS — Z86718 Personal history of other venous thrombosis and embolism: Secondary | ICD-10-CM | POA: Insufficient documentation

## 2022-08-22 LAB — CMP (CANCER CENTER ONLY)
ALT: 57 U/L — ABNORMAL HIGH (ref 0–44)
AST: 46 U/L — ABNORMAL HIGH (ref 15–41)
Albumin: 4.5 g/dL (ref 3.5–5.0)
Alkaline Phosphatase: 47 U/L (ref 38–126)
Anion gap: 11 (ref 5–15)
BUN: 16 mg/dL (ref 8–23)
CO2: 24 mmol/L (ref 22–32)
Calcium: 9.5 mg/dL (ref 8.9–10.3)
Chloride: 103 mmol/L (ref 98–111)
Creatinine: 0.85 mg/dL (ref 0.61–1.24)
GFR, Estimated: >60 mL/min (ref >60)
Glucose, Bld: 88 mg/dL (ref 70–99)
Potassium: 3.6 mmol/L (ref 3.5–5.1)
Sodium: 138 mmol/L (ref 135–145)
Total Bilirubin: 0.9 mg/dL (ref 0.3–1.2)
Total Protein: 7.5 g/dL (ref 6.5–8.1)

## 2022-08-22 LAB — CBC WITH DIFFERENTIAL (CANCER CENTER ONLY)
Abs Immature Granulocytes: 0.02 10*3/uL (ref 0.00–0.07)
Basophils Absolute: 0 10*3/uL (ref 0.0–0.1)
Basophils Relative: 1 %
Eosinophils Absolute: 0.1 10*3/uL (ref 0.0–0.5)
Eosinophils Relative: 2 %
HCT: 43.1 % (ref 39.0–52.0)
Hemoglobin: 14.7 g/dL (ref 13.0–17.0)
Immature Granulocytes: 1 %
Lymphocytes Relative: 45 %
Lymphs Abs: 2 10*3/uL (ref 0.7–4.0)
MCH: 35.1 pg — ABNORMAL HIGH (ref 26.0–34.0)
MCHC: 34.1 g/dL (ref 30.0–36.0)
MCV: 102.9 fL — ABNORMAL HIGH (ref 80.0–100.0)
Monocytes Absolute: 0.4 10*3/uL (ref 0.1–1.0)
Monocytes Relative: 10 %
Neutro Abs: 1.7 10*3/uL (ref 1.7–7.7)
Neutrophils Relative %: 41 %
Platelet Count: 124 10*3/uL — ABNORMAL LOW (ref 150–400)
RBC: 4.19 MIL/uL — ABNORMAL LOW (ref 4.22–5.81)
RDW: 14 % (ref 11.5–15.5)
WBC Count: 4.3 10*3/uL (ref 4.0–10.5)
nRBC: 0 % (ref 0.0–0.2)

## 2022-08-22 LAB — RETICULOCYTES
Immature Retic Fract: 12.2 % (ref 2.3–15.9)
RBC.: 4.28 MIL/uL (ref 4.22–5.81)
Retic Count, Absolute: 75.3 10*3/uL (ref 19.0–186.0)
Retic Ct Pct: 1.8 % (ref 0.4–3.1)

## 2022-08-22 LAB — LACTATE DEHYDROGENASE: LDH: 201 U/L — ABNORMAL HIGH (ref 98–192)

## 2022-08-22 LAB — FERRITIN: Ferritin: 138 ng/mL (ref 24–336)

## 2022-08-22 MED ORDER — SODIUM CHLORIDE 0.9 % IV SOLN: INTRAVENOUS | Status: DC

## 2022-08-22 MED ORDER — SODIUM CHLORIDE 0.9 % IV SOLN: Freq: Once | INTRAVENOUS | Status: AC

## 2022-08-22 MED ORDER — ZOLEDRONIC ACID 4 MG/100ML IV SOLN
4.0000 mg | Freq: Once | INTRAVENOUS | Status: AC
  Administered 2022-08-22: 4 mg via INTRAVENOUS
  Filled 2022-08-22: qty 100

## 2022-08-22 NOTE — Patient Instructions (Signed)

## 2022-08-22 NOTE — Progress Notes (Signed)
Hematology and Oncology Follow Up Visit  Lelynd Sain [id number] 11-07-1955 67 y.o. 08/22/2022   Principle Diagnosis:  IgG Kappa myeloma - Hyperdiploid/+11 DVT of the LEFT and RIGHT leg  Iron deficiency anemia  Current Therapy:   Revlimid 63m po q day (21 on/7 off) - start on 04/07/2018   Zometa 4 mg IV q 3 months - next dose is 11/2022 Xarelto 10 mg PO daily IV Iron as needed   Interim History:  Mr. PGoodlowis here today for follow-up.  We last saw him 3 months ago.  He had a wonderful Holiday season.  He actually just got back from a cruise down to the [place]  He had a wonderful time.  He is exercising.  He is working out.  He is doing some strength training now.  Hopefully, he will be able to play some golf.  He has had no nausea or vomiting.  He has had no change in bowel or bladder habits.  He is doing well on the Revlimid.  When we last saw him, his monoclonal spike was not observed.  His IgG level was 1040 mg/dL.  The Kappa light chain was 3.3 mg/dL.  He has had no issues with fever.  He has had no problems with COVID.  He has had no leg swelling.  He is on Xarelto chronically.  His last iron studies back in August showed a ferritin of 90 with an iron saturation of 33%.  Overall, I would say that his performance status is ECOG 0. . .  Medications:  Allergies as of 08/22/2022       Reactions   Singulair [montelukast] Other (See Comments)   Mood change        Medication List        Accurate as of August 22, 2022  2:19 PM. If you have any questions, ask your nurse or doctor.          acetaminophen 325 MG tablet Commonly known as: TYLENOL Take 2 tablets (650 mg total) by mouth every 6 (six) hours as needed for mild pain (or Fever >/= 101).   albuterol 108 (90 Base) MCG/ACT inhaler Commonly known as: VENTOLIN HFA TAKE 2 PUFFS BY MOUTH EVERY 6 HOURS AS NEEDED FOR WHEEZE OR SHORTNESS OF BREATH   allopurinol 100 MG tablet Commonly known as:  ZYLOPRIM TAKE 1 TABLET BY MOUTH EVERY DAY   amLODipine 5 MG tablet Commonly known as: NORVASC TAKE 1 TABLET (5 MG TOTAL) BY MOUTH DAILY.   budesonide-formoterol 160-4.5 MCG/ACT inhaler Commonly known as: Symbicort TAKE 2 PUFFS BY MOUTH TWICE A DAY   colchicine 0.6 MG tablet Take 1 tablet (0.6 mg total) by mouth 2 (two) times daily as needed (when having a gout flare).   fluorouracil 5 % cream Commonly known as: EFUDEX Apply 1 Application topically 2 (two) times daily as needed (For rash on head).   fluticasone 50 MCG/ACT nasal spray Commonly known as: FLONASE SPRAY 2 SPRAYS INTO EACH NOSTRIL EVERY DAY   gabapentin 300 MG capsule Commonly known as: NEURONTIN Take 300 mg by mouth 2 (two) times daily.   ipratropium 0.03 % nasal spray Commonly known as: ATROVENT PLACE 1-2 SPRAYS INTO BOTH NOSTRILS 2 (TWO) TIMES DAILY AS NEEDED (NASAL DRAINAGE).   lenalidomide 5 MG capsule Commonly known as: REVLIMID TAKE 1 CAPSULE BY MOUTH DAILY  FOR 21 DAYS, THEN 7 DAYS OFF   loperamide 2 MG capsule Commonly known as: IMODIUM Take 2 mg by mouth as needed  for diarrhea or loose stools.   valsartan 160 MG tablet Commonly known as: DIOVAN Take 1 tablet (160 mg total) by mouth daily.   Vitamin D3 250 MCG (10000 UT) capsule Take 10,000 Units by mouth daily.   Xarelto 10 MG Tabs tablet Generic drug: rivaroxaban TAKE 1 TABLET BY MOUTH EVERY DAY What changed: how much to take   zolpidem 5 MG tablet Commonly known as: AMBIEN Take 1 tablet (5 mg total) by mouth at bedtime as needed for sleep.        Allergies:  Allergies  Allergen Reactions   Singulair [Montelukast] Other (See Comments)    Mood change    Past Medical History, Surgical history, Social history, and Family History were reviewed and updated.  Review of Systems: Review of Systems  Constitutional: Negative.   HENT: Negative.   Eyes: Negative.   Respiratory: Negative.   Cardiovascular: Negative.    Gastrointestinal: Negative.   Genitourinary: Negative.   Musculoskeletal: Positive for myalgias.  Skin: Negative.   Neurological: Negative.   Endo/Heme/Allergies: Negative.   Psychiatric/Behavioral: Negative.      Physical Exam:  height is 5' 7"$  (1.702 m) and weight is 190 lb (86.2 kg). His oral temperature is 97.9 F (36.6 C). His blood pressure is 136/83 and his pulse is 57 (abnormal). His respiration is 20 and oxygen saturation is 99%.   Wt Readings from Last 3 Encounters:  08/22/22 190 lb (86.2 kg)  06/25/22 189 lb 3.2 oz (85.8 kg)  05/29/22 184 lb 8 oz (83.7 kg)    Physical Exam Vitals signs reviewed.  HENT:     Head: Normocephalic and atraumatic.  Eyes:     Pupils: Pupils are equal, round, and reactive to light.  Neck:     Musculoskeletal: Normal range of motion.  Cardiovascular:     Rate and Rhythm: Normal rate and regular rhythm.     Heart sounds: Normal heart sounds.  Pulmonary:     Effort: Pulmonary effort is normal.     Breath sounds: Normal breath sounds.  Abdominal:     General: Bowel sounds are normal.     Palpations: Abdomen is soft.  Musculoskeletal: Normal range of motion.        General: No tenderness or deformity.  Lymphadenopathy:     Cervical: No cervical adenopathy.  Skin:    General: Skin is warm and dry.     Findings: No erythema or rash.  Neurological:     Mental Status: He is alert and oriented to person, place, and time.  Psychiatric:        Behavior: Behavior normal.        Thought Content: Thought content normal.        Judgment: Judgment normal.      Lab Results  Component Value Date   WBC 4.3 08/22/2022   HGB 14.7 08/22/2022   HCT 43.1 08/22/2022   MCV 102.9 (H) 08/22/2022   PLT 124 (L) 08/22/2022   Lab Results  Component Value Date   FERRITIN 90 02/20/2022   IRON 109 02/20/2022   TIBC 332 02/20/2022   UIBC 223 02/20/2022   IRONPCTSAT 33 02/20/2022   Lab Results  Component Value Date   RETICCTPCT 1.8 08/22/2022    RBC 4.19 (L) 08/22/2022   RBC 4.28 08/22/2022   Lab Results  Component Value Date   KPAFRELGTCHN 33.2 (H) 05/22/2022   LAMBDASER 24.9 05/22/2022   KAPLAMBRATIO 1.33 05/22/2022   Lab Results  Component Value Date   IGGSERUM  1,040 05/22/2022   IGA 408 05/22/2022   IGMSERUM 44 05/22/2022   Lab Results  Component Value Date   TOTALPROTELP 6.5 05/22/2022   ALBUMINELP 3.8 05/22/2022   A1GS 0.2 05/22/2022   A2GS 0.5 05/22/2022   BETS 0.9 05/22/2022   GAMS 1.1 05/22/2022   MSPIKE Not Observed 05/22/2022   SPEI Comment 01/05/2018     Chemistry      Component Value Date/Time   NA 141 05/22/2022 1323   NA 143 06/30/2017 1049   NA 140 10/03/2016 0845   K 3.9 05/22/2022 1323   K 4.0 06/30/2017 1049   K 4.3 10/03/2016 0845   CL 106 05/22/2022 1323   CL 105 06/30/2017 1049   CO2 27 05/22/2022 1323   CO2 27 06/30/2017 1049   CO2 26 10/03/2016 0845   BUN 14 05/22/2022 1323   BUN 13 06/30/2017 1049   BUN 11.9 10/03/2016 0845   CREATININE 0.92 05/22/2022 1323   CREATININE 1.0 06/30/2017 1049   CREATININE 0.9 10/03/2016 0845      Component Value Date/Time   CALCIUM 9.7 05/22/2022 1323   CALCIUM 9.0 06/30/2017 1049   CALCIUM 9.9 10/03/2016 0845   ALKPHOS 48 05/22/2022 1323   ALKPHOS 57 06/30/2017 1049   ALKPHOS 80 10/03/2016 0845   AST 32 05/22/2022 1323   AST 28 10/03/2016 0845   ALT 35 05/22/2022 1323   ALT 43 06/30/2017 1049   ALT 26 10/03/2016 0845   BILITOT 0.9 05/22/2022 1323   BILITOT 0.48 10/03/2016 0845      Impression and Plan: Mr. Nyborg is a pleasant 67 yo gentleman with IgG kappa myeloma.  He ultimately underwent an autologous stem cell transplant at [facility] in February 2018.   I will be troubled by his platelet count trend.  His platelet count is trending downward.  Will go ahead to follow this closely.  His LFTs are little bit elevated.  I suppose this might be secondary to him going on the cruise.  I would like to see him back in 6 weeks.  If the  platelet count continues to trend downward, we may have to think about stopping his Revlimid.  We may have to also think about a bone marrow biopsy to make sure there is nothing with recurrent myeloma.    [name], MD 2/9/20242:19 PM

## 2022-08-24 LAB — IGG, IGA, IGM
IgA: 451 mg/dL — ABNORMAL HIGH (ref 61–437)
IgG (Immunoglobin G), Serum: 1125 mg/dL (ref 603–1613)
IgM (Immunoglobulin M), Srm: 29 mg/dL (ref 20–172)

## 2022-08-25 LAB — IRON AND IRON BINDING CAPACITY (CC-WL,HP ONLY)
Iron: 173 ug/dL (ref 45–182)
Saturation Ratios: 47 % — ABNORMAL HIGH (ref 17.9–39.5)
TIBC: 370 ug/dL (ref 250–450)
UIBC: 197 ug/dL (ref 117–376)

## 2022-08-25 LAB — KAPPA/LAMBDA LIGHT CHAINS
Kappa free light chain: 34.5 mg/L — ABNORMAL HIGH (ref 3.3–19.4)
Kappa, lambda light chain ratio: 1.61 (ref 0.26–1.65)
Lambda free light chains: 21.4 mg/L (ref 5.7–26.3)

## 2022-08-26 LAB — PROTEIN ELECTROPHORESIS, SERUM, WITH REFLEX
A/G Ratio: 1.5 (ref 0.7–1.7)
Albumin ELP: 4.1 g/dL (ref 2.9–4.4)
Alpha-1-Globulin: 0.2 g/dL (ref 0.0–0.4)
Alpha-2-Globulin: 0.5 g/dL (ref 0.4–1.0)
Beta Globulin: 1 g/dL (ref 0.7–1.3)
Gamma Globulin: 1.2 g/dL (ref 0.4–1.8)
Globulin, Total: 2.8 g/dL (ref 2.2–3.9)
Total Protein ELP: 6.9 g/dL (ref 6.0–8.5)

## 2022-09-08 ENCOUNTER — Other Ambulatory Visit: Payer: Self-pay | Admitting: Hematology & Oncology

## 2022-09-08 DIAGNOSIS — C9000 Multiple myeloma not having achieved remission: Secondary | ICD-10-CM

## 2022-09-10 ENCOUNTER — Other Ambulatory Visit: Payer: Self-pay | Admitting: *Deleted

## 2022-09-10 DIAGNOSIS — C9000 Multiple myeloma not having achieved remission: Secondary | ICD-10-CM

## 2022-09-10 MED ORDER — LENALIDOMIDE 5 MG PO CAPS: ORAL_CAPSULE | ORAL | 0 refills | Status: DC

## 2022-09-19 ENCOUNTER — Telehealth: Payer: Self-pay | Admitting: Medical

## 2022-09-19 NOTE — Telephone Encounter (Signed)
Pt stated he is having a gout flare up but is in [place] so he cannot make an appt. He stated he has some left over prednisone '10mg'$ , 6 tabs and would like to know if he could take that to help for the time being.

## 2022-09-22 NOTE — Telephone Encounter (Signed)
Left message on machine to call back if not doing better.

## 2022-09-22 NOTE — Telephone Encounter (Signed)
Pt called back stating that he is feeling better and he feels that meds are not needed.

## 2022-09-23 ENCOUNTER — Inpatient Hospital Stay: Payer: Medicare Other | Attending: Hematology & Oncology

## 2022-09-23 DIAGNOSIS — D509 Iron deficiency anemia, unspecified: Secondary | ICD-10-CM | POA: Insufficient documentation

## 2022-09-23 DIAGNOSIS — Z9484 Stem cells transplant status: Secondary | ICD-10-CM | POA: Insufficient documentation

## 2022-09-23 DIAGNOSIS — C9000 Multiple myeloma not having achieved remission: Secondary | ICD-10-CM | POA: Insufficient documentation

## 2022-09-23 DIAGNOSIS — Z7901 Long term (current) use of anticoagulants: Secondary | ICD-10-CM | POA: Insufficient documentation

## 2022-09-23 DIAGNOSIS — Z86718 Personal history of other venous thrombosis and embolism: Secondary | ICD-10-CM | POA: Insufficient documentation

## 2022-09-23 DIAGNOSIS — Z79899 Other long term (current) drug therapy: Secondary | ICD-10-CM | POA: Insufficient documentation

## 2022-09-26 ENCOUNTER — Telehealth: Payer: Self-pay | Admitting: Medical

## 2022-09-26 NOTE — Telephone Encounter (Signed)
Pt called stating that his ENT is retiring and was wondering if Percell Miller could take over prescribing his gabapentin for his throat.  Prescription Request  09/26/2022  Is this a "Controlled Substance" medicine? No  LOV: Visit date not found  What is the name of the medication or equipment?   gabapentin (NEURONTIN) 300 MG capsule [id number]   Have you contacted your pharmacy to request a refill? No   Which pharmacy would you like this sent to?   [id number] [address], [address] [place] [address] Phone: [phone] Fax: [phone]  Patient notified that their request is being sent to the clinical staff for review and that they should receive a response within 2 business days.   Please advise at Mobile [phone] (mobile)

## 2022-09-27 ENCOUNTER — Encounter: Payer: Self-pay | Admitting: Medical

## 2022-09-27 MED ORDER — GABAPENTIN 300 MG PO CAPS: 300.0000 mg | ORAL_CAPSULE | Freq: Two times a day (BID) | ORAL | 0 refills | Status: DC

## 2022-09-27 NOTE — Addendum Note (Signed)
Addended by: Anabel Halon on: 09/27/2022 03:21 PM   Modules accepted: Orders

## 2022-09-27 NOTE — Telephone Encounter (Signed)
Rx 2 week of gabapentin sent to pharmacy pending appointment to discuss.

## 2022-10-02 ENCOUNTER — Inpatient Hospital Stay (HOSPITAL_BASED_OUTPATIENT_CLINIC_OR_DEPARTMENT_OTHER): Payer: Medicare Other | Admitting: Hematology & Oncology

## 2022-10-02 ENCOUNTER — Encounter: Payer: Self-pay | Admitting: Hematology & Oncology

## 2022-10-02 ENCOUNTER — Inpatient Hospital Stay: Payer: Medicare Other

## 2022-10-02 VITALS — BP 116/65 | HR 65 | Temp 98.1°F | Resp 20 | Ht 67.0 in | Wt 185.1 lb

## 2022-10-02 DIAGNOSIS — Z7901 Long term (current) use of anticoagulants: Secondary | ICD-10-CM | POA: Diagnosis not present

## 2022-10-02 DIAGNOSIS — Z79899 Other long term (current) drug therapy: Secondary | ICD-10-CM | POA: Diagnosis not present

## 2022-10-02 DIAGNOSIS — C9000 Multiple myeloma not having achieved remission: Secondary | ICD-10-CM

## 2022-10-02 DIAGNOSIS — D509 Iron deficiency anemia, unspecified: Secondary | ICD-10-CM | POA: Diagnosis not present

## 2022-10-02 DIAGNOSIS — Z86718 Personal history of other venous thrombosis and embolism: Secondary | ICD-10-CM | POA: Diagnosis not present

## 2022-10-02 DIAGNOSIS — Z9484 Stem cells transplant status: Secondary | ICD-10-CM | POA: Diagnosis not present

## 2022-10-02 LAB — CBC WITH DIFFERENTIAL (CANCER CENTER ONLY)
Abs Immature Granulocytes: 0.01 10*3/uL (ref 0.00–0.07)
Basophils Absolute: 0.1 10*3/uL (ref 0.0–0.1)
Basophils Relative: 2 %
Eosinophils Absolute: 0.2 10*3/uL (ref 0.0–0.5)
Eosinophils Relative: 5 %
HCT: 42.5 % (ref 39.0–52.0)
Hemoglobin: 14.9 g/dL (ref 13.0–17.0)
Immature Granulocytes: 0 %
Lymphocytes Relative: 43 %
Lymphs Abs: 1.7 10*3/uL (ref 0.7–4.0)
MCH: 35.4 pg — ABNORMAL HIGH (ref 26.0–34.0)
MCHC: 35.1 g/dL (ref 30.0–36.0)
MCV: 101 fL — ABNORMAL HIGH (ref 80.0–100.0)
Monocytes Absolute: 0.4 10*3/uL (ref 0.1–1.0)
Monocytes Relative: 10 %
Neutro Abs: 1.6 10*3/uL — ABNORMAL LOW (ref 1.7–7.7)
Neutrophils Relative %: 40 %
Platelet Count: 144 10*3/uL — ABNORMAL LOW (ref 150–400)
RBC: 4.21 MIL/uL — ABNORMAL LOW (ref 4.22–5.81)
RDW: 13.2 % (ref 11.5–15.5)
WBC Count: 3.9 10*3/uL — ABNORMAL LOW (ref 4.0–10.5)
nRBC: 0 % (ref 0.0–0.2)

## 2022-10-02 LAB — CMP (CANCER CENTER ONLY)
ALT: 49 U/L — ABNORMAL HIGH (ref 0–44)
AST: 39 U/L (ref 15–41)
Albumin: 4.3 g/dL (ref 3.5–5.0)
Alkaline Phosphatase: 46 U/L (ref 38–126)
Anion gap: 10 (ref 5–15)
BUN: 17 mg/dL (ref 8–23)
CO2: 25 mmol/L (ref 22–32)
Calcium: 9.3 mg/dL (ref 8.9–10.3)
Chloride: 104 mmol/L (ref 98–111)
Creatinine: 1.03 mg/dL (ref 0.61–1.24)
GFR, Estimated: >60 mL/min (ref >60)
Glucose, Bld: 96 mg/dL (ref 70–99)
Potassium: 3.8 mmol/L (ref 3.5–5.1)
Sodium: 139 mmol/L (ref 135–145)
Total Bilirubin: 0.8 mg/dL (ref 0.3–1.2)
Total Protein: 6.8 g/dL (ref 6.5–8.1)

## 2022-10-02 LAB — LACTATE DEHYDROGENASE: LDH: 189 U/L (ref 98–192)

## 2022-10-02 LAB — SAVE SMEAR(SSMR), FOR PROVIDER SLIDE REVIEW

## 2022-10-02 NOTE — Progress Notes (Signed)
Hematology and Oncology Follow Up Visit  Carwyn Lacina [id number] 16-Jul-1955 67 y.o. 10/02/2022   Principle Diagnosis:  IgG Kappa myeloma - Hyperdiploid/+11 DVT of the LEFT and RIGHT leg  Iron deficiency anemia  Current Therapy:   Revlimid 5mg  po q day (21 on/7 off) - start on 04/07/2018   Zometa 4 mg IV q 3 months - next dose is 11/2022 Xarelto 10 mg PO daily IV Iron as needed   Interim History:  Mr. Balko is here today for follow-up.  He is doing quite well.  He is doing a lot of stretching.  He wants to try to play golf.  I am sure that he will be able to do this.  A good time down in [facility].  He was down there to meet his son.  His son actually lives over in [place] and comes over to the [place] for his job.  I am thankful that Mr. Tennyson platelet count is higher now.  I was a bit worried about this.  He has had no problems with fever.  He has had no bleeding.  He has had no nausea or vomiting.  He still has little bit of diarrhea.  He has tried different remedies but nothing really seems to help this.  He has had no bleeding.  He has had no leg swelling.  As far as the myeloma is concerned, so far has been no problems with myeloma showing activity.  When we last saw him, his iron studies showed a ferritin of 138 with an iron saturation 47%.  Overall, I would say that his performance status is probably ECOG 0.   .  Medications:  Allergies as of 10/02/2022       Reactions   Singulair [montelukast] Other (See Comments)   Mood change        Medication List        Accurate as of October 02, 2022  3:06 PM. If you have any questions, ask your nurse or doctor.          acetaminophen 325 MG tablet Commonly known as: TYLENOL Take 2 tablets (650 mg total) by mouth every 6 (six) hours as needed for mild pain (or Fever >/= 101).   albuterol 108 (90 Base) MCG/ACT inhaler Commonly known as: VENTOLIN HFA TAKE 2 PUFFS BY MOUTH EVERY 6 HOURS AS NEEDED  FOR WHEEZE OR SHORTNESS OF BREATH   allopurinol 100 MG tablet Commonly known as: ZYLOPRIM TAKE 1 TABLET BY MOUTH EVERY DAY   amLODipine 5 MG tablet Commonly known as: NORVASC TAKE 1 TABLET (5 MG TOTAL) BY MOUTH DAILY.   budesonide-formoterol 160-4.5 MCG/ACT inhaler Commonly known as: Symbicort TAKE 2 PUFFS BY MOUTH TWICE A DAY   colchicine 0.6 MG tablet Take 1 tablet (0.6 mg total) by mouth 2 (two) times daily as needed (when having a gout flare).   FISH OIL HIGH POTENCY PO Take by mouth daily.   fluorouracil 5 % cream Commonly known as: EFUDEX Apply 1 Application topically 2 (two) times daily as needed (For rash on head).   fluticasone 50 MCG/ACT nasal spray Commonly known as: FLONASE SPRAY 2 SPRAYS INTO EACH NOSTRIL EVERY DAY   gabapentin 300 MG capsule Commonly known as: NEURONTIN Take 1 capsule (300 mg total) by mouth 2 (two) times daily.   ipratropium 0.03 % nasal spray Commonly known as: ATROVENT PLACE 1-2 SPRAYS INTO BOTH NOSTRILS 2 (TWO) TIMES DAILY AS NEEDED (NASAL DRAINAGE).   lenalidomide 5 MG capsule  Commonly known asGabriel Rainwater # [id number]     Date Obtained 09/10/22 What changed: additional instructions   loperamide 2 MG capsule Commonly known as: IMODIUM Take 2 mg by mouth as needed for diarrhea or loose stools.   valsartan 160 MG tablet Commonly known as: DIOVAN Take 1 tablet (160 mg total) by mouth daily.   Vitamin D3 250 MCG (10000 UT) Caps Generic drug: Cholecalciferol Take 10,000 Units by mouth daily.   Xarelto 10 MG Tabs tablet Generic drug: rivaroxaban TAKE 1 TABLET BY MOUTH EVERY DAY What changed: how much to take   zolpidem 5 MG tablet Commonly known as: AMBIEN Take 1 tablet (5 mg total) by mouth at bedtime as needed for sleep.        Allergies:  Allergies  Allergen Reactions   Singulair [Montelukast] Other (See Comments)    Mood change    Past Medical History, Surgical history, Social history, and Family  History were reviewed and updated.  Review of Systems: Review of Systems  Constitutional: Negative.   HENT: Negative.   Eyes: Negative.   Respiratory: Negative.   Cardiovascular: Negative.   Gastrointestinal: Negative.   Genitourinary: Negative.   Musculoskeletal: Positive for myalgias.  Skin: Negative.   Neurological: Negative.   Endo/Heme/Allergies: Negative.   Psychiatric/Behavioral: Negative.      Physical Exam:  height is 5\' 7"  (1.702 m) and weight is 185 lb 1.9 oz (84 kg). His oral temperature is 98.1 F (36.7 C). His blood pressure is 116/65 and his pulse is 65. His respiration is 20 and oxygen saturation is 95%.   Wt Readings from Last 3 Encounters:  10/02/22 185 lb 1.9 oz (84 kg)  08/22/22 190 lb (86.2 kg)  06/25/22 189 lb 3.2 oz (85.8 kg)    Physical Exam Vitals signs reviewed.  HENT:     Head: Normocephalic and atraumatic.  Eyes:     Pupils: Pupils are equal, round, and reactive to light.  Neck:     Musculoskeletal: Normal range of motion.  Cardiovascular:     Rate and Rhythm: Normal rate and regular rhythm.     Heart sounds: Normal heart sounds.  Pulmonary:     Effort: Pulmonary effort is normal.     Breath sounds: Normal breath sounds.  Abdominal:     General: Bowel sounds are normal.     Palpations: Abdomen is soft.  Musculoskeletal: Normal range of motion.        General: No tenderness or deformity.  Lymphadenopathy:     Cervical: No cervical adenopathy.  Skin:    General: Skin is warm and dry.     Findings: No erythema or rash.  Neurological:     Mental Status: He is alert and oriented to person, place, and time.  Psychiatric:        Behavior: Behavior normal.        Thought Content: Thought content normal.        Judgment: Judgment normal.      Lab Results  Component Value Date   WBC 3.9 (L) 10/02/2022   HGB 14.9 10/02/2022   HCT 42.5 10/02/2022   MCV 101.0 (H) 10/02/2022   PLT 144 (L) 10/02/2022   Lab Results  Component Value  Date   FERRITIN 138 08/22/2022   IRON 173 08/22/2022   TIBC 370 08/22/2022   UIBC 197 08/22/2022   IRONPCTSAT 47 (H) 08/22/2022   Lab Results  Component Value Date   RETICCTPCT 1.8 08/22/2022   RBC 4.21 (  L) 10/02/2022   Lab Results  Component Value Date   KPAFRELGTCHN 34.5 (H) 08/22/2022   LAMBDASER 21.4 08/22/2022   KAPLAMBRATIO 1.61 08/22/2022   Lab Results  Component Value Date   IGGSERUM 1,125 08/22/2022   IGA 451 (H) 08/22/2022   IGMSERUM 29 08/22/2022   Lab Results  Component Value Date   TOTALPROTELP 6.9 08/22/2022   ALBUMINELP 4.1 08/22/2022   A1GS 0.2 08/22/2022   A2GS 0.5 08/22/2022   BETS 1.0 08/22/2022   GAMS 1.2 08/22/2022   MSPIKE Not Observed 08/22/2022   SPEI Comment 01/05/2018     Chemistry      Component Value Date/Time   NA 138 08/22/2022 1330   NA 143 06/30/2017 1049   NA 140 10/03/2016 0845   K 3.6 08/22/2022 1330   K 4.0 06/30/2017 1049   K 4.3 10/03/2016 0845   CL 103 08/22/2022 1330   CL 105 06/30/2017 1049   CO2 24 08/22/2022 1330   CO2 27 06/30/2017 1049   CO2 26 10/03/2016 0845   BUN 16 08/22/2022 1330   BUN 13 06/30/2017 1049   BUN 11.9 10/03/2016 0845   CREATININE 0.85 08/22/2022 1330   CREATININE 1.0 06/30/2017 1049   CREATININE 0.9 10/03/2016 0845      Component Value Date/Time   CALCIUM 9.5 08/22/2022 1330   CALCIUM 9.0 06/30/2017 1049   CALCIUM 9.9 10/03/2016 0845   ALKPHOS 47 08/22/2022 1330   ALKPHOS 57 06/30/2017 1049   ALKPHOS 80 10/03/2016 0845   AST 46 (H) 08/22/2022 1330   AST 28 10/03/2016 0845   ALT 57 (H) 08/22/2022 1330   ALT 43 06/30/2017 1049   ALT 26 10/03/2016 0845   BILITOT 0.9 08/22/2022 1330   BILITOT 0.48 10/03/2016 0845      Impression and Plan: Mr. Rohlman is a pleasant 67 yo gentleman with IgG kappa myeloma.  He ultimately underwent an autologous stem cell transplant at [facility] in February 2018.   His platelet count is better.  His LFTs are better.  As such, I think we can probably get him  back now in 2 months.  When he comes back, he will get his Zometa.   [name], MD 3/21/20243:06 PM

## 2022-10-03 LAB — KAPPA/LAMBDA LIGHT CHAINS
Kappa free light chain: 34.3 mg/L — ABNORMAL HIGH (ref 3.3–19.4)
Kappa, lambda light chain ratio: 1.45 (ref 0.26–1.65)
Lambda free light chains: 23.6 mg/L (ref 5.7–26.3)

## 2022-10-04 LAB — IGG, IGA, IGM
IgA: 394 mg/dL (ref 61–437)
IgG (Immunoglobin G), Serum: 1011 mg/dL (ref 603–1613)
IgM (Immunoglobulin M), Srm: 33 mg/dL (ref 20–172)

## 2022-10-05 ENCOUNTER — Other Ambulatory Visit: Payer: Self-pay | Admitting: Medical

## 2022-10-06 LAB — PROTEIN ELECTROPHORESIS, SERUM, WITH REFLEX
A/G Ratio: 1.6 (ref 0.7–1.7)
Albumin ELP: 3.9 g/dL (ref 2.9–4.4)
Alpha-1-Globulin: 0.2 g/dL (ref 0.0–0.4)
Alpha-2-Globulin: 0.4 g/dL (ref 0.4–1.0)
Beta Globulin: 0.8 g/dL (ref 0.7–1.3)
Gamma Globulin: 1 g/dL (ref 0.4–1.8)
Globulin, Total: 2.5 g/dL (ref 2.2–3.9)
Total Protein ELP: 6.4 g/dL (ref 6.0–8.5)

## 2022-10-07 ENCOUNTER — Ambulatory Visit (INDEPENDENT_AMBULATORY_CARE_PROVIDER_SITE_OTHER): Payer: Medicare Other | Admitting: Medical

## 2022-10-07 VITALS — BP 136/78 | HR 74 | Resp 18 | Ht 67.0 in | Wt 188.4 lb

## 2022-10-07 DIAGNOSIS — I10 Essential (primary) hypertension: Secondary | ICD-10-CM

## 2022-10-07 DIAGNOSIS — G629 Polyneuropathy, unspecified: Secondary | ICD-10-CM

## 2022-10-07 DIAGNOSIS — J3089 Other allergic rhinitis: Secondary | ICD-10-CM | POA: Diagnosis not present

## 2022-10-07 DIAGNOSIS — R053 Chronic cough: Secondary | ICD-10-CM

## 2022-10-07 MED ORDER — GABAPENTIN 300 MG PO CAPS: 300.0000 mg | ORAL_CAPSULE | Freq: Two times a day (BID) | ORAL | 11 refills | Status: DC

## 2022-10-07 NOTE — Progress Notes (Signed)
Subjective:    Patient ID: Jorge [name], male    DOB: Nov 25, 1955, 67 y.o.   MRN: [id number]  HPI  Pt in for follow up.  Pt has hx of chronic cough for very long time. Pt seen by pulmonlogist, ent and allergist. Over the year he had ace inhibitor stopped, allergy advise and tried various meds. In the end he used gabapentin. He states if he use zyrtec, impatropium nasal spray and gabapentin his cough has been controlled.    He also notes he has some neuropathy of both feet. Worse on left side.  He gets benefit with gabapentin for this as well.  Pt has used gabapentin 300 mg twice daily.    Htn- bp well controlled. Valartan 160 mg daily and amlodopine 5 mg daily.   Insomnia- on ambien. Takes 1/2-1 tab weekly recently. Continue to use sparingly.   Review of Systems  Constitutional:  Negative for chills, fatigue and fever.  HENT:  Negative for congestion, drooling and ear discharge.   Respiratory:  Negative for cough, chest tightness, shortness of breath and wheezing.   Cardiovascular:  Negative for chest pain and palpitations.  Gastrointestinal:  Negative for abdominal pain and blood in stool.  Genitourinary:  Negative for dysuria, frequency and hematuria.  Musculoskeletal:  Negative for back pain, gait problem and joint swelling.  Skin:  Negative for rash.  Neurological:  Negative for dizziness, seizures and light-headedness.       Lower extremity.   Hematological:  Negative for adenopathy. Does not bruise/bleed easily.  Psychiatric/Behavioral:  Positive for sleep disturbance. Negative for behavioral problems, confusion and suicidal ideas. The patient is not nervous/anxious.        Occasional insomnia. Was worse in past. Upcoming retirement in December.    Past Medical History:  Diagnosis Date   Allergy    Anxiety    Asthma    Blood transfusion without reported diagnosis 2018   Bone metastasis    Chest cold 05/19/2016   productive cough  -- started on antibiotic    Chronic back pain    due to bone mets from myeloma   Cough    Depression    Diverticulitis    GERD (gastroesophageal reflux disease)    Hiatal hernia    History of chicken pox    History of concussion    age 59 -- no residual   History of DVT of lower extremity 03/21/2016  treated and completed w/ xarelto   per doppler left extensive occlusion common femoral, femoral, and popliteal veins and right partial occlusion common femoral and profunda femoral veins/  last doppler 06-04-2016 no evidence acute or chronic dvt noted either leg    History of radiation therapy 06/09/16-06/23/16   lower thoracic spine 25 Gy in 10 fractions   Mouth ulcers    secondary to radiation   Multiple myeloma ([place]) dx 02/22/2016 via bone marrow bx---  oncologist-  [name]   IgG Kappa-- Hyperdiploid/ +11 w/ bone mets--  current treatment chemotherapy (started 08/ 2017)and pallitive radiation to back started 06-09-2016   Renal calculus, right    Wears contact lenses      Social History   Socioeconomic History   Marital status: Married    Spouse name: Not on file   Number of children: 3   Years of education: Not on file   Highest education level: Not on file  Occupational History   Not on file  Tobacco Use   Smoking status: Former  Packs/day: 1.00    Years: 7.00    Additional pack years: 0.00    Total pack years: 7.00    Types: Cigarettes    Quit date: 11/02/1980    Years since quitting: 41.9   Smokeless tobacco: Never  Vaping Use   Vaping Use: Never used  Substance and Sexual Activity   Alcohol use: Yes    Comment: occasional   Drug use: No   Sexual activity: Not Currently  Other Topics Concern   Not on file  Social History Narrative   Not on file   Social Determinants of Health   Financial Resource Strain: Low Risk  (08/21/2022)   Overall Financial Resource Strain (CARDIA)    Difficulty of Paying Living Expenses: Not hard at all  Food Insecurity: No Food Insecurity (08/21/2022)   Hunger  Vital Sign    Worried About Running Out of Food in the Last Year: Never true    Ran Out of Food in the Last Year: Never true  Transportation Needs: No Transportation Needs (08/21/2022)   PRAPARE - Hydrologist (Medical): No    Lack of Transportation (Non-Medical): No  Physical Activity: Sufficiently Active (07/02/2021)   Exercise Vital Sign    Days of Exercise per Week: 7 days    Minutes of Exercise per Session: 60 min  Stress: No Stress Concern Present (08/21/2022)   [place]    Feeling of Stress : Not at all  Social Connections: Moderately Integrated (08/21/2022)   Social Connection and Isolation Panel [NHANES]    Frequency of Communication with Friends and Family: Three times a week    Frequency of Social Gatherings with Friends and Family: Twice a week    Attends Religious Services: Never    Marine scientist or Organizations: Yes    Attends Archivist Meetings: 1 to 4 times per year    Marital Status: Married  Human resources officer Violence: Not At Risk (08/21/2022)   Humiliation, Afraid, Rape, and Kick questionnaire    Fear of Current or Ex-Partner: No    Emotionally Abused: No    Physically Abused: No    Sexually Abused: No    Past Surgical History:  Procedure Laterality Date   COLONOSCOPY  2008,2013   COLONOSCOPY  01/07/2017   Dr Penelope Coop, [facility]   CYSTOSCOPY W/ URETERAL STENT PLACEMENT Right 06/20/2016   Procedure: CYSTOSCOPY WITH STENT REPLACEMENT;  Surgeon: Kathie Rhodes, MD;  Location: [facility];  Service: Urology;  Laterality: Right;   CYSTOSCOPY WITH RETROGRADE PYELOGRAM, URETEROSCOPY AND STENT PLACEMENT Right 05/30/2016   Procedure: CYSTOSCOPY WITH RETROGRADE PYELOGRAM, URETEROSCOPY AND STENT PLACEMENT,DILITATION URETERAL STRICTURE;  Surgeon: Kathie Rhodes, MD;  Location: [facility];  Service: Urology;  Laterality: Right;    CYSTOSCOPY/RETROGRADE/URETEROSCOPY/STONE EXTRACTION WITH BASKET Right 06/20/2016   Procedure: CYSTOSCOPY/URETEROSCOPY/STONE EXTRACTION WITH BASKET;  Surgeon: Kathie Rhodes, MD;  Location: [facility];  Service: Urology;  Laterality: Right;   HIATAL HERNIA REPAIR N/A 08/13/2018   Procedure: LAPAROSCOPIC REPAIR OF HIATAL HERNIA REPAIR WITH FUNDOPLICATION AND INSERTION OF MESH;  Surgeon: Excell Seltzer, MD;  Location: [facility];  Service: General;  Laterality: N/A;   HOLMIUM LASER APPLICATION Right [id number]   Procedure: HOLMIUM LASER APPLICATION;  Surgeon: Kathie Rhodes, MD;  Location: [facility];  Service: Urology;  Laterality: Right;   IR GENERIC HISTORICAL  02/11/2016   IR RADIOLOGIST EVAL & MGMT 02/11/2016 [facility]   IR  GENERIC HISTORICAL  02/15/2016   IR BONE TUMOR(S)RF ABLATION 02/15/2016 Luanne Bras, MD [facility]   IR GENERIC HISTORICAL  02/15/2016   IR BONE TUMOR(S)RF ABLATION 02/15/2016 Luanne Bras, MD [facility]   IR GENERIC HISTORICAL  02/15/2016   IR BONE TUMOR(S)RF ABLATION 02/15/2016 Luanne Bras, MD [facility]   IR GENERIC HISTORICAL  02/15/2016   IR KYPHO THORACIC WITH BONE BIOPSY 02/15/2016 Luanne Bras, MD [facility]   IR GENERIC HISTORICAL  02/15/2016   IR KYPHO THORACIC WITH BONE BIOPSY 02/15/2016 Luanne Bras, MD [facility]   IR GENERIC HISTORICAL  02/15/2016   IR VERTEBROPLASTY CERV/THOR BX INC UNI/BIL INC/INJECT/IMAGING 02/15/2016 Luanne Bras, MD [facility]   IR GENERIC HISTORICAL  03/13/2016   IR KYPHO EA ADDL LEVEL THORACIC OR LUMBAR 03/13/2016 Luanne Bras, MD [facility]   IR GENERIC HISTORICAL  03/13/2016   IR KYPHO EA ADDL LEVEL THORACIC OR LUMBAR 03/13/2016 Luanne Bras, MD [facility]   IR GENERIC HISTORICAL  03/13/2016   IR BONE TUMOR(S)RF ABLATION 03/13/2016 Luanne Bras, MD [facility]   IR GENERIC HISTORICAL  03/13/2016   IR KYPHO LUMBAR INC FX REDUCE BONE BX UNI/BIL CANNULATION  INC/IMAGING 03/13/2016 Luanne Bras, MD [facility]   IR GENERIC HISTORICAL  03/13/2016   IR BONE TUMOR(S)RF ABLATION 03/13/2016 Luanne Bras, MD [facility]   IR GENERIC HISTORICAL  03/13/2016   IR BONE TUMOR(S)RF ABLATION 03/13/2016 Luanne Bras, MD [facility]   IR GENERIC HISTORICAL  03/31/2016   IR RADIOLOGIST EVAL & MGMT 03/31/2016 [facility]   KYPHOPLASTY     02/2016, 03/2016, 11/2016   LAPAROSCOPIC INGUINAL HERNIA REPAIR Bilateral 12-16-2013  dr gross   RADIOLOGY WITH ANESTHESIA N/A 02/15/2016   Procedure: Spinal Ablation;  Surgeon: Luanne Bras, MD;  Location: [place];  Service: Radiology;  Laterality: N/A;   RADIOLOGY WITH ANESTHESIA N/A 03/13/2016   Procedure: LUMBER ABLATION;  Surgeon: Luanne Bras, MD;  Location: [place];  Service: Radiology;  Laterality: N/A;   ROTATOR CUFF REPAIR Right 2003   spine operation     x1 in 2018 x3 in 2017   TONSILLECTOMY  age 37   VERTEBROPLASTY  01/2016   WISDOM TOOTH EXTRACTION      Family History  Problem Relation Age of Onset   Uterine cancer Mother    Ovarian cancer Mother    Colon cancer Mother        said it was rectal or colon but not sure   Rectal cancer Mother    Heart disease Father    Hypertension Father    Multiple sclerosis Sister    Paranoid behavior Brother    Drug abuse Brother    Schizophrenia Brother    Stroke Maternal Grandfather    Cancer Maternal Aunt    Leukemia Paternal Aunt    Healthy Son        x1   Healthy Daughter        x2   Allergies Daughter        x1   Diabetes Neg Hx    Alzheimer's disease Neg Hx    Parkinson's disease Neg Hx    Esophageal cancer Neg Hx    Stomach cancer Neg Hx     Allergies  Allergen Reactions   Singulair [Montelukast] Other (See Comments)    Mood change    Current Outpatient Medications on File Prior to Visit  Medication Sig Dispense Refill   acetaminophen (TYLENOL) 325 MG tablet Take 2 tablets (650 mg total) by  mouth every 6 (six) hours as needed  for mild pain (or Fever >/= 101).     albuterol (VENTOLIN HFA) 108 (90 Base) MCG/ACT inhaler TAKE 2 PUFFS BY MOUTH EVERY 6 HOURS AS NEEDED FOR WHEEZE OR SHORTNESS OF BREATH (Patient not taking: Reported on 08/22/2022) 6.7 each 1   allopurinol (ZYLOPRIM) 100 MG tablet TAKE 1 TABLET BY MOUTH EVERY DAY 30 tablet 2   amLODipine (NORVASC) 5 MG tablet TAKE 1 TABLET (5 MG TOTAL) BY MOUTH DAILY. 90 tablet 0   budesonide-formoterol (SYMBICORT) 160-4.5 MCG/ACT inhaler TAKE 2 PUFFS BY MOUTH TWICE A DAY (Patient not taking: Reported on 08/22/2022) 10.2 each 4   Cholecalciferol (VITAMIN D3) 250 MCG (10000 UT) capsule Take 10,000 Units by mouth daily.     colchicine 0.6 MG tablet Take 1 tablet (0.6 mg total) by mouth 2 (two) times daily as needed (when having a gout flare). (Patient not taking: Reported on 08/22/2022) 30 tablet 0   fluorouracil (EFUDEX) 5 % cream Apply 1 Application topically 2 (two) times daily as needed (For rash on head). (Patient not taking: Reported on 10/02/2022)     fluticasone (FLONASE) 50 MCG/ACT nasal spray SPRAY 2 SPRAYS INTO EACH NOSTRIL EVERY DAY 16 mL 1   ipratropium (ATROVENT) 0.03 % nasal spray PLACE 1-2 SPRAYS INTO BOTH NOSTRILS 2 (TWO) TIMES DAILY AS NEEDED (NASAL DRAINAGE). 30 mL 5   lenalidomide (REVLIMID) 5 MG capsule [employer] Auth # [id number]     Date Obtained 09/10/22 (Patient taking differently: [employer] Auth # [id number]     Date Obtained 09/10/22 10/02/2022 On 21 days, off 7 days.) 21 capsule 0   loperamide (IMODIUM) 2 MG capsule Take 2 mg by mouth as needed for diarrhea or loose stools.     Omega-3 Fatty Acids (FISH OIL HIGH POTENCY PO) Take by mouth daily.     valsartan (DIOVAN) 160 MG tablet Take 1 tablet (160 mg total) by mouth daily. 30 tablet 11   XARELTO 10 MG TABS tablet TAKE 1 TABLET BY MOUTH EVERY DAY (Patient taking differently: Take 10 mg by mouth daily.) 90 tablet 3   zolpidem (AMBIEN) 5 MG tablet Take 1 tablet (5 mg total) by mouth at bedtime as needed for sleep. 30  tablet 1   Current Facility-Administered Medications on File Prior to Visit  Medication Dose Route Frequency Provider Last Rate Last Admin   0.9 %  sodium chloride infusion   Intravenous PRN Kamaree Berkel, Percell Miller, PA-C       heparin lock flush 100 unit/mL  500 Units Intravenous Once Celso Amy, NP       sodium chloride flush (NS) 0.9 % injection 3 mL  3 mL Intravenous Once PRN Celso Amy, NP        BP 136/78   Pulse 74   Resp 18   Ht 5\' 7"  (1.702 m)   Wt 188 lb 6.4 oz (85.5 kg)   SpO2 96%   BMI 29.51 kg/m        Objective:   Physical Exam  General Mental Status- Alert. General Appearance- Not in acute distress.   Skin General: Color- Normal Color. Moisture- Normal Moisture.  Neck Carotid Arteries- Normal color. Moisture- Normal Moisture. No carotid bruits. No JVD.  Chest and Lung Exam Auscultation: Breath Sounds:-Normal.  Cardiovascular Auscultation:Rythm- Regular. Murmurs & Other Heart Sounds:Auscultation of the heart reveals- No Murmurs.  Abdomen Inspection:-Inspeection Normal. Palpation/Percussion:Note:No mass. Palpation and Percussion of the abdomen reveal- Non Tender, Non Distended + BS, no rebound or  guarding.  Neurologic Cranial Nerve exam:- CN III-XII intact(No nystagmus), symmetric smile. Strength:- 5/5 equal and symmetric strength both upper and lower extremities.       Assessment & Plan:   Patient Instructions  1. Neuropathy Rx gabapentin for neuropathy  2. Chronic cough(former plan ent, allergist and pulmonologist) Continue flonase, zyrtec, flonase, ipatropium nasal spray and gabapetin.   3. Perennial allergic rhinitis Continue zyrtec and flonase  4. Hypertension, unspecified type. Bp contolled.  Valartan 160 mg daily and amlodopine 5 mg daily.   Follow up 6 month or sooner if needed.   Mackie Pai, PA-C

## 2022-10-07 NOTE — Patient Instructions (Addendum)
1. Neuropathy Rx gabapentin for neuropathy.  2. Chronic cough(former plan ent, allergist and pulmonologist) Continue flonase, zyrtec, flonase, ipatropium nasal spray and gabapetin.   3. Perennial allergic rhinitis Continue zyrtec and flonase  4. Hypertension, unspecified type. Bp contolled.  Valartan 160 mg daily and amlodopine 5 mg daily.   Follow up 6 month or sooner if needed.

## 2022-10-08 ENCOUNTER — Other Ambulatory Visit: Payer: Self-pay | Admitting: Hematology & Oncology

## 2022-10-08 DIAGNOSIS — C9000 Multiple myeloma not having achieved remission: Secondary | ICD-10-CM

## 2022-10-09 LAB — IMMUNOFIXATION ELECTROPHORESIS
IgA: 389 mg/dL (ref 61–437)
IgG (Immunoglobin G), Serum: 1044 mg/dL (ref 603–1613)
IgM (Immunoglobulin M), Srm: 34 mg/dL (ref 20–172)
Total Protein ELP: 6.6 g/dL (ref 6.0–8.5)

## 2022-10-10 ENCOUNTER — Encounter: Payer: Self-pay | Admitting: Hematology & Oncology

## 2022-10-15 ENCOUNTER — Other Ambulatory Visit: Payer: Self-pay | Admitting: Medical

## 2022-10-15 NOTE — Telephone Encounter (Signed)
Requesting: Ambien 5mg   Contract: 08/05/21 UDS: 08/06/21 Last Visit: 10/07/22 Next Visit: 04/07/23 Last Refill: 02/21/22 #30 and 1RF   Please Advise

## 2022-10-17 ENCOUNTER — Telehealth: Payer: Self-pay

## 2022-10-17 DIAGNOSIS — Z79899 Other long term (current) drug therapy: Secondary | ICD-10-CM

## 2022-10-17 DIAGNOSIS — G8929 Other chronic pain: Secondary | ICD-10-CM

## 2022-10-17 NOTE — Telephone Encounter (Signed)
Placed orders and made new controlled substance contract

## 2022-10-17 NOTE — Telephone Encounter (Signed)
On review 30 tab last pt about 4 months. If you would let pt know want him to sign contract and give uds to comply with [place] regulation. Does not need to see me that day just nurse visit. He sees Dr. Myna Hidalgo fairly regularly so can be same day as next visit with Dr. Myna Hidalgo if that works.

## 2022-10-17 NOTE — Telephone Encounter (Signed)
Pt notified via mychart message   Contract made for upcoming lab appt

## 2022-10-23 ENCOUNTER — Other Ambulatory Visit: Payer: Medicare Other

## 2022-10-23 DIAGNOSIS — G8929 Other chronic pain: Secondary | ICD-10-CM

## 2022-10-23 DIAGNOSIS — Z79899 Other long term (current) drug therapy: Secondary | ICD-10-CM

## 2022-10-25 LAB — DRUG MONITORING PANEL 376104, URINE
Amphetamines: NEGATIVE ng/mL (ref <500)
Barbiturates: NEGATIVE ng/mL (ref <300)
Benzodiazepines: NEGATIVE ng/mL (ref <100)
Cocaine Metabolite: NEGATIVE ng/mL (ref <150)
Desmethyltramadol: NEGATIVE ng/mL (ref <100)
Opiates: NEGATIVE ng/mL (ref <100)
Oxycodone: NEGATIVE ng/mL (ref <100)
Tramadol: NEGATIVE ng/mL (ref <100)

## 2022-10-25 LAB — DM TEMPLATE

## 2022-11-06 ENCOUNTER — Other Ambulatory Visit: Payer: Self-pay | Admitting: Hematology & Oncology

## 2022-11-06 ENCOUNTER — Other Ambulatory Visit: Payer: Self-pay | Admitting: *Deleted

## 2022-11-06 DIAGNOSIS — C9000 Multiple myeloma not having achieved remission: Secondary | ICD-10-CM

## 2022-11-06 MED ORDER — LENALIDOMIDE 5 MG PO CAPS: ORAL_CAPSULE | ORAL | 0 refills | Status: DC

## 2022-11-10 ENCOUNTER — Other Ambulatory Visit: Payer: Self-pay | Admitting: Hematology & Oncology

## 2022-11-12 ENCOUNTER — Other Ambulatory Visit: Payer: Self-pay | Admitting: Medical

## 2022-11-12 DIAGNOSIS — L03032 Cellulitis of left toe: Secondary | ICD-10-CM

## 2022-11-16 ENCOUNTER — Encounter: Payer: Self-pay | Admitting: Hematology & Oncology

## 2022-11-27 ENCOUNTER — Ambulatory Visit (INDEPENDENT_AMBULATORY_CARE_PROVIDER_SITE_OTHER): Payer: Medicare Other | Admitting: Allergy

## 2022-11-27 ENCOUNTER — Encounter: Payer: Self-pay | Admitting: Allergy

## 2022-11-27 ENCOUNTER — Other Ambulatory Visit: Payer: Self-pay

## 2022-11-27 VITALS — BP 110/72 | HR 56 | Temp 97.9°F | Resp 18 | Ht 67.75 in | Wt 185.5 lb

## 2022-11-27 DIAGNOSIS — B999 Unspecified infectious disease: Secondary | ICD-10-CM

## 2022-11-27 DIAGNOSIS — R053 Chronic cough: Secondary | ICD-10-CM

## 2022-11-27 DIAGNOSIS — J3089 Other allergic rhinitis: Secondary | ICD-10-CM

## 2022-11-27 NOTE — Assessment & Plan Note (Signed)
Past history - Perennial rhinitis symptoms for the last 5 years with worsening coughing in the mornings.  Tried Flonase and Astelin with good benefit.  No benefit with antihistamines.  2022 skin testing showed: Positive to perennial mold #4 and dust mites. Singulair caused mood changes. Interim history - controlled with Atrovent.  Use Atrovent (ipratropium) 0.03% 1 spray per nostril twice a day as needed for runny nose/drainage. Use Flonase (fluticasone) nasal spray 1 spray per nostril twice a day as needed for nasal congestion.  Nasal saline spray (i.e., Simply Saline) or nasal saline lavage (i.e., NeilMed) is recommended as needed and prior to medicated nasal sprays. Continue environmental control measures. Use over the counter antihistamines such as Zyrtec (cetirizine), Claritin (loratadine), Allegra (fexofenadine), or Xyzal (levocetirizine) daily as needed. May take twice a day during allergy flares. May switch antihistamines every few months.

## 2022-11-27 NOTE — Patient Instructions (Addendum)
Rhinitis: 2022 skin testing showed: Positive to perennial mold #4 and dust mites.  Use Atrovent (ipratropium) 0.03% 1 spray per nostril twice a day as needed for runny nose/drainage. Use Flonase (fluticasone) nasal spray 1 spray per nostril twice a day as needed for nasal congestion.  Nasal saline spray (i.e., Simply Saline) or nasal saline lavage (i.e., NeilMed) is recommended as needed and prior to medicated nasal sprays. Continue environmental control measures. Use over the counter antihistamines such as Zyrtec (cetirizine), Claritin (loratadine), Allegra (fexofenadine), or Xyzal (levocetirizine) daily as needed. May take twice a day during allergy flares. May switch antihistamines every few months.  Coughing:  Follows with pulmonology/ENT. Continue gabapentin as per ENT. Daily controller medication(s): none. During respiratory infections/asthma flares: start Symbicort 2 puffs twice a day and rinse mouth after each use for 1-2 weeks until your breathing symptoms return to baseline.  May use albuterol rescue inhaler 2 puffs every 4 to 6 hours as needed for shortness of breath, chest tightness, coughing, and wheezing. May use albuterol rescue inhaler 2 puffs 5 to 15 minutes prior to strenuous physical activities. Monitor frequency of use.  Breathing control goals:  Full participation in all desired activities (may need albuterol before activity) Albuterol use two times or less a week on average (not counting use with activity) Cough interfering with sleep two times or less a month Oral steroids no more than once a year No hospitalizations   Infections: Keep track of infections and antibiotics use.  Follow up if needed.   Control of House Dust Mite Allergen Dust mite allergens are a common trigger of allergy and asthma symptoms. While they can be found throughout the house, these microscopic creatures thrive in warm, humid environments such as bedding, upholstered furniture and  carpeting. Because so much time is spent in the bedroom, it is essential to reduce mite levels there.  Encase pillows, mattresses, and box springs in special allergen-proof fabric covers or airtight, zippered plastic covers.  Bedding should be washed weekly in hot water (130 F) and dried in a hot dryer. Allergen-proof covers are available for comforters and pillows that can't be regularly washed.  Wash the allergy-proof covers every few months. Minimize clutter in the bedroom. Keep pets out of the bedroom.  Keep humidity less than 50% by using a dehumidifier or air conditioning. You can buy a humidity measuring device called a hygrometer to monitor this.  If possible, replace carpets with hardwood, linoleum, or washable area rugs. If that's not possible, vacuum frequently with a vacuum that has a HEPA filter. Remove all upholstered furniture and non-washable window drapes from the bedroom. Remove all non-washable stuffed toys from the bedroom.  Wash stuffed toys weekly.  Mold Control Mold and fungi can grow on a variety of surfaces provided certain temperature and moisture conditions exist.  Outdoor molds grow on plants, decaying vegetation and soil. The major outdoor mold, Alternaria and Cladosporium, are found in very high numbers during hot and dry conditions. Generally, a late summer - fall peak is seen for common outdoor fungal spores. Rain will temporarily lower outdoor mold spore count, but counts rise rapidly when the rainy period ends. The most important indoor molds are Aspergillus and Penicillium. Dark, humid and poorly ventilated basements are ideal sites for mold growth. The next most common sites of mold growth are the bathroom and the kitchen. Outdoor (Seasonal) Mold Control Use air conditioning and keep windows closed. Avoid exposure to decaying vegetation. Avoid leaf raking. Avoid grain handling. Consider  wearing a face mask if working in moldy areas.  Indoor (Perennial) Mold  Control  Maintain humidity below 50%. Get rid of mold growth on hard surfaces with water, detergent and, if necessary, 5% bleach (do not mix with other cleaners). Then dry the area completely. If mold covers an area more than 10 square feet, consider hiring an indoor environmental professional. For clothing, washing with soap and water is best. If moldy items cannot be cleaned and dried, throw them away. Remove sources e.g. contaminated carpets. Repair and seal leaking roofs or pipes. Using dehumidifiers in damp basements may be helpful, but empty the water and clean units regularly to prevent mildew from forming. All rooms, especially basements, bathrooms and kitchens, require ventilation and cleaning to deter mold and mildew growth. Avoid carpeting on concrete or damp floors, and storing items in damp areas.

## 2022-11-27 NOTE — Assessment & Plan Note (Signed)
Past history - Coughing, wheezing, shortness of breath and chest tightness for 5 years.  Tried albuterol and Symbicort as needed with some benefit.  COVID-19 infection 2 months ago.  Being treated for multiple myeloma for the last 5 years.  Follows with pulmonology.  Tried PPI with no benefit. 2022 PFT showed restriction, 2022 CT chest showed atelectasis, hiatal hernia, lytic rib lesions. Interim history - controlled with Atrovent and gabapentin.  Follows with pulmonology/ENT. Continue gabapentin as per ENT. Daily controller medication(s): none. During respiratory infections/asthma flares: start Symbicort 2 puffs twice a day and rinse mouth after each use for 1-2 weeks until your breathing symptoms return to baseline.  May use albuterol rescue inhaler 2 puffs every 4 to 6 hours as needed for shortness of breath, chest tightness, coughing, and wheezing. May use albuterol rescue inhaler 2 puffs 5 to 15 minutes prior to strenuous physical activities. Monitor frequency of use.

## 2022-11-27 NOTE — Progress Notes (Signed)
Follow Up Note  RE: Jorge [name] MRN: [id number] DOB: August 08, 1955 Date of Office Visit: 11/27/2022  Referring provider: Marisue Brooklyn Primary care provider: Esperanza Richters, PA-C  Chief Complaint: Follow-up  History of Present Illness: I had the pleasure of seeing Labradford Sainz for a follow up visit at the [facility] on 11/27/2022. He is a 67 y.o. male, who is being followed for allergic rhinitis, cough, recurrent infections. His previous allergy office visit was on 05/29/2022 with Dr. Selena Batten. Today is a regular follow up visit.  Perennial allergic rhinitis Currently taking ipratropium 1 spray per nostril BID with good benefit. No nosebleeds. No benefit with taking 2 sprays per nostril. Takes zyrtec 10mg  daily. Not taking Flonase.  Chronic cough Usually has some morning coughing.  Gabapentin 300mg  BID really helped.  Seeing pulm once per year. ENT stopped taking his insurance.   Denies any SOB, wheezing, chest tightness, nocturnal awakenings, ER/urgent care visits or prednisone use since the last visit for this.  Recurrent infections Patient had IV antibiotics after gout turned into cellulitis and was hospitalized for a few days.  Taking allopurinol and colchicine prn with good benefit. Had an okay response to pneumonia vaccine.  Assessment and Plan: Vernon is a 67 y.o. male with: Perennial allergic rhinitis Past history - Perennial rhinitis symptoms for the last 5 years with worsening coughing in the mornings.  Tried Flonase and Astelin with good benefit.  No benefit with antihistamines.  2022 skin testing showed: Positive to perennial mold #4 and dust mites. Singulair caused mood changes. Interim history - controlled with Atrovent.  Use Atrovent (ipratropium) 0.03% 1 spray per nostril twice a day as needed for runny nose/drainage. Use Flonase (fluticasone) nasal spray 1 spray per nostril twice a day as needed for nasal congestion.  Nasal saline  spray (i.e., Simply Saline) or nasal saline lavage (i.e., NeilMed) is recommended as needed and prior to medicated nasal sprays. Continue environmental control measures. Use over the counter antihistamines such as Zyrtec (cetirizine), Claritin (loratadine), Allegra (fexofenadine), or Xyzal (levocetirizine) daily as needed. May take twice a day during allergy flares. May switch antihistamines every few months.  Chronic cough Past history - Coughing, wheezing, shortness of breath and chest tightness for 5 years.  Tried albuterol and Symbicort as needed with some benefit.  COVID-19 infection 2 months ago.  Being treated for multiple myeloma for the last 5 years.  Follows with pulmonology.  Tried PPI with no benefit. 2022 PFT showed restriction, 2022 CT chest showed atelectasis, hiatal hernia, lytic rib lesions. Interim history - controlled with Atrovent and gabapentin.  Follows with pulmonology/ENT. Continue gabapentin as per ENT. Daily controller medication(s): none. During respiratory infections/asthma flares: start Symbicort 2 puffs twice a day and rinse mouth after each use for 1-2 weeks until your breathing symptoms return to baseline.  May use albuterol rescue inhaler 2 puffs every 4 to 6 hours as needed for shortness of breath, chest tightness, coughing, and wheezing. May use albuterol rescue inhaler 2 puffs 5 to 15 minutes prior to strenuous physical activities. Monitor frequency of use.   Recurrent infections Past history - Frequent bouts of pneumonia requiring hospitalization and ? intubation in the past. Medical history significant for multiple myeloma.  Interim history - 2024 bloodwork normal IgG, good tetanus/diptheria titers. Adequate response to pneumovax vaccine. Hospitalized for IV antibiotics for cellulitis from gout.  Keep track of infections and antibiotics use.  Return if symptoms worsen or fail to improve.  No  orders of the defined types were placed in this  encounter.  Lab Orders  No laboratory test(s) ordered today    Diagnostics: None.   Medication List:  Current Outpatient Medications  Medication Sig Dispense Refill   acetaminophen (TYLENOL) 325 MG tablet Take 2 tablets (650 mg total) by mouth every 6 (six) hours as needed for mild pain (or Fever >/= 101).     allopurinol (ZYLOPRIM) 100 MG tablet TAKE 1 TABLET BY MOUTH EVERY DAY 30 tablet 2   amLODipine (NORVASC) 5 MG tablet TAKE 1 TABLET (5 MG TOTAL) BY MOUTH DAILY. 90 tablet 0   Cholecalciferol (VITAMIN D3) 250 MCG (10000 UT) capsule Take 10,000 Units by mouth daily.     colchicine 0.6 MG tablet Take 1 tablet (0.6 mg total) by mouth 2 (two) times daily as needed (when having a gout flare). 30 tablet 0   fluorouracil (EFUDEX) 5 % cream Apply 1 Application topically 2 (two) times daily as needed (For rash on head).     gabapentin (NEURONTIN) 300 MG capsule Take 1 capsule (300 mg total) by mouth 2 (two) times daily. 60 capsule 11   ipratropium (ATROVENT) 0.03 % nasal spray PLACE 1-2 SPRAYS INTO BOTH NOSTRILS 2 (TWO) TIMES DAILY AS NEEDED (NASAL DRAINAGE). 30 mL 5   lenalidomide (REVLIMID) 5 MG capsule TAKE 1 CAPSULE BY MOUTH DAILY  FOR 21 DAYS, THEN 7 DAYS OFF Auth#[id number] 21 capsule 0   loperamide (IMODIUM) 2 MG capsule Take 2 mg by mouth as needed for diarrhea or loose stools.     Omega-3 Fatty Acids (FISH OIL HIGH POTENCY PO) Take by mouth daily.     valsartan (DIOVAN) 160 MG tablet Take 1 tablet (160 mg total) by mouth daily. 30 tablet 11   XARELTO 10 MG TABS tablet TAKE 1 TABLET BY MOUTH EVERY DAY (Patient taking differently: Take 10 mg by mouth daily.) 90 tablet 3   zolpidem (AMBIEN) 5 MG tablet TAKE 1 TABLET BY MOUTH AT BEDTIME AS NEEDED FOR SLEEP. 30 tablet 0   albuterol (VENTOLIN HFA) 108 (90 Base) MCG/ACT inhaler TAKE 2 PUFFS BY MOUTH EVERY 6 HOURS AS NEEDED FOR WHEEZE OR SHORTNESS OF BREATH (Patient not taking: Reported on 08/22/2022) 6.7 each 1   budesonide-formoterol (SYMBICORT)  160-4.5 MCG/ACT inhaler TAKE 2 PUFFS BY MOUTH TWICE A DAY (Patient not taking: Reported on 11/27/2022) 10.2 each 4   rosuvastatin (CRESTOR) 5 MG tablet Take by mouth. (Patient not taking: Reported on 11/27/2022)     Current Facility-Administered Medications  Medication Dose Route Frequency Provider Last Rate Last Admin   0.9 %  sodium chloride infusion   Intravenous PRN Saguier, Ramon Dredge, PA-C       Facility-Administered Medications Ordered in Other Visits  Medication Dose Route Frequency Provider Last Rate Last Admin   heparin lock flush 100 unit/mL  500 Units Intravenous Once Erenest Blank, NP       sodium chloride flush (NS) 0.9 % injection 3 mL  3 mL Intravenous Once PRN Erenest Blank, NP       Allergies: Allergies  Allergen Reactions   Singulair [Montelukast] Other (See Comments)    Mood change   I reviewed his past medical history, social history, family history, and environmental history and no significant changes have been reported from his previous visit.  Review of Systems  Constitutional:  Negative for appetite change, chills, fever and unexpected weight change.  HENT:  Negative for congestion, postnasal drip and rhinorrhea.   Eyes:  Negative  for itching.  Respiratory:  Positive for cough. Negative for chest tightness, shortness of breath and wheezing.   Cardiovascular:  Negative for chest pain.  Gastrointestinal:  Negative for abdominal pain.  Genitourinary:  Negative for difficulty urinating.  Skin:  Negative for rash.  Allergic/Immunologic: Positive for environmental allergies. Negative for food allergies.  Neurological:  Negative for headaches.    Objective: BP 110/72   Pulse (!) 56   Temp 97.9 F (36.6 C)   Resp 18   Ht 5' 7.75" (1.721 m)   Wt 185 lb 8 oz (84.1 kg)   SpO2 95%   BMI 28.41 kg/m  Body mass index is 28.41 kg/m. Physical Exam Vitals and nursing note reviewed.  Constitutional:      Appearance: Normal appearance. He is well-developed.  HENT:      Head: Normocephalic and atraumatic.     Right Ear: Tympanic membrane and external ear normal.     Left Ear: Tympanic membrane and external ear normal.     Nose: Nose normal.     Mouth/Throat:     Mouth: Mucous membranes are moist.     Pharynx: Oropharynx is clear.  Eyes:     Conjunctiva/sclera: Conjunctivae normal.  Cardiovascular:     Rate and Rhythm: Normal rate and regular rhythm.     Heart sounds: Normal heart sounds. No murmur heard.    No friction rub. No gallop.  Pulmonary:     Effort: Pulmonary effort is normal.     Breath sounds: Normal breath sounds. No wheezing, rhonchi or rales.  Musculoskeletal:     Cervical back: Neck supple.  Skin:    General: Skin is warm.     Findings: No rash.  Neurological:     Mental Status: He is alert and oriented to person, place, and time.  Psychiatric:        Behavior: Behavior normal.    Previous notes and tests were reviewed. The plan was reviewed with the patient/family, and all questions/concerned were addressed.  It was my pleasure to see Reif today and participate in his care. Please feel free to contact me with any questions or concerns.  Sincerely,  Wyline Mood, DO Allergy & Immunology  [facility] office: [phone] [facility] office: [phone]

## 2022-11-27 NOTE — Assessment & Plan Note (Signed)
Past history - Frequent bouts of pneumonia requiring hospitalization and ? intubation in the past. Medical history significant for multiple myeloma.  Interim history - 2024 bloodwork normal IgG, good tetanus/diptheria titers. Adequate response to pneumovax vaccine. Hospitalized for IV antibiotics for cellulitis from gout.  Keep track of infections and antibiotics use.

## 2022-11-29 ENCOUNTER — Other Ambulatory Visit: Payer: Self-pay | Admitting: Hematology & Oncology

## 2022-11-29 DIAGNOSIS — C9000 Multiple myeloma not having achieved remission: Secondary | ICD-10-CM

## 2022-11-29 DIAGNOSIS — D472 Monoclonal gammopathy: Secondary | ICD-10-CM

## 2022-11-29 DIAGNOSIS — M545 Low back pain, unspecified: Secondary | ICD-10-CM

## 2022-12-01 ENCOUNTER — Encounter: Payer: Self-pay | Admitting: Hematology & Oncology

## 2022-12-03 ENCOUNTER — Other Ambulatory Visit: Payer: Self-pay | Admitting: *Deleted

## 2022-12-03 ENCOUNTER — Other Ambulatory Visit: Payer: Self-pay | Admitting: Hematology & Oncology

## 2022-12-03 DIAGNOSIS — C9000 Multiple myeloma not having achieved remission: Secondary | ICD-10-CM

## 2022-12-03 MED ORDER — LENALIDOMIDE 5 MG PO CAPS
ORAL_CAPSULE | ORAL | 0 refills | Status: DC
Start: 2022-12-03 — End: 2022-12-26

## 2022-12-04 ENCOUNTER — Other Ambulatory Visit: Payer: Self-pay

## 2022-12-04 ENCOUNTER — Inpatient Hospital Stay (HOSPITAL_BASED_OUTPATIENT_CLINIC_OR_DEPARTMENT_OTHER): Payer: Medicare Other | Admitting: Hematology & Oncology

## 2022-12-04 ENCOUNTER — Inpatient Hospital Stay: Payer: Medicare Other

## 2022-12-04 ENCOUNTER — Inpatient Hospital Stay: Payer: Medicare Other | Attending: Hematology & Oncology

## 2022-12-04 ENCOUNTER — Encounter: Payer: Self-pay | Admitting: Hematology & Oncology

## 2022-12-04 VITALS — BP 100/75 | HR 63 | Temp 98.7°F | Resp 18 | Ht 67.0 in | Wt 185.0 lb

## 2022-12-04 DIAGNOSIS — Z79899 Other long term (current) drug therapy: Secondary | ICD-10-CM | POA: Diagnosis not present

## 2022-12-04 DIAGNOSIS — Z86718 Personal history of other venous thrombosis and embolism: Secondary | ICD-10-CM | POA: Insufficient documentation

## 2022-12-04 DIAGNOSIS — C9000 Multiple myeloma not having achieved remission: Secondary | ICD-10-CM

## 2022-12-04 DIAGNOSIS — Z7901 Long term (current) use of anticoagulants: Secondary | ICD-10-CM | POA: Diagnosis not present

## 2022-12-04 DIAGNOSIS — D500 Iron deficiency anemia secondary to blood loss (chronic): Secondary | ICD-10-CM

## 2022-12-04 DIAGNOSIS — D509 Iron deficiency anemia, unspecified: Secondary | ICD-10-CM | POA: Insufficient documentation

## 2022-12-04 DIAGNOSIS — Z9484 Stem cells transplant status: Secondary | ICD-10-CM | POA: Diagnosis not present

## 2022-12-04 LAB — IRON AND IRON BINDING CAPACITY (CC-WL,HP ONLY)
Iron: 138 ug/dL (ref 45–182)
Saturation Ratios: 40 % — ABNORMAL HIGH (ref 17.9–39.5)
TIBC: 346 ug/dL (ref 250–450)
UIBC: 208 ug/dL (ref 117–376)

## 2022-12-04 LAB — CBC WITH DIFFERENTIAL (CANCER CENTER ONLY)
Abs Immature Granulocytes: 0.01 10*3/uL (ref 0.00–0.07)
Basophils Absolute: 0.1 10*3/uL (ref 0.0–0.1)
Basophils Relative: 1 %
Eosinophils Absolute: 0.1 10*3/uL (ref 0.0–0.5)
Eosinophils Relative: 3 %
HCT: 44.8 % (ref 39.0–52.0)
Hemoglobin: 15.4 g/dL (ref 13.0–17.0)
Immature Granulocytes: 0 %
Lymphocytes Relative: 53 %
Lymphs Abs: 1.9 10*3/uL (ref 0.7–4.0)
MCH: 35.1 pg — ABNORMAL HIGH (ref 26.0–34.0)
MCHC: 34.4 g/dL (ref 30.0–36.0)
MCV: 102.1 fL — ABNORMAL HIGH (ref 80.0–100.0)
Monocytes Absolute: 0.4 10*3/uL (ref 0.1–1.0)
Monocytes Relative: 12 %
Neutro Abs: 1.2 10*3/uL — ABNORMAL LOW (ref 1.7–7.7)
Neutrophils Relative %: 31 %
Platelet Count: 110 10*3/uL — ABNORMAL LOW (ref 150–400)
RBC: 4.39 MIL/uL (ref 4.22–5.81)
RDW: 12.6 % (ref 11.5–15.5)
WBC Count: 3.7 10*3/uL — ABNORMAL LOW (ref 4.0–10.5)
nRBC: 0 % (ref 0.0–0.2)

## 2022-12-04 LAB — CMP (CANCER CENTER ONLY)
ALT: 43 U/L (ref 0–44)
AST: 42 U/L — ABNORMAL HIGH (ref 15–41)
Albumin: 4.4 g/dL (ref 3.5–5.0)
Alkaline Phosphatase: 41 U/L (ref 38–126)
Anion gap: 10 (ref 5–15)
BUN: 14 mg/dL (ref 8–23)
CO2: 23 mmol/L (ref 22–32)
Calcium: 9.7 mg/dL (ref 8.9–10.3)
Chloride: 104 mmol/L (ref 98–111)
Creatinine: 0.99 mg/dL (ref 0.61–1.24)
GFR, Estimated: >60 mL/min (ref >60)
Glucose, Bld: 105 mg/dL — ABNORMAL HIGH (ref 70–99)
Potassium: 4 mmol/L (ref 3.5–5.1)
Sodium: 137 mmol/L (ref 135–145)
Total Bilirubin: 1 mg/dL (ref 0.3–1.2)
Total Protein: 6.8 g/dL (ref 6.5–8.1)

## 2022-12-04 LAB — FERRITIN: Ferritin: 86 ng/mL (ref 24–336)

## 2022-12-04 LAB — LACTATE DEHYDROGENASE: LDH: 185 U/L (ref 98–192)

## 2022-12-04 MED ORDER — ZOLEDRONIC ACID 4 MG/100ML IV SOLN
4.0000 mg | Freq: Once | INTRAVENOUS | Status: AC
  Administered 2022-12-04: 4 mg via INTRAVENOUS
  Filled 2022-12-04: qty 100

## 2022-12-04 MED ORDER — SODIUM CHLORIDE 0.9 % IV SOLN: INTRAVENOUS | Status: DC

## 2022-12-04 NOTE — Patient Instructions (Signed)

## 2022-12-04 NOTE — Progress Notes (Signed)
Hematology and Oncology Follow Up Visit  Jorge [name] [id number] 12-09-1955 67 y.o. 12/04/2022   Principle Diagnosis:  IgG Kappa myeloma - Hyperdiploid/+11 DVT of the LEFT and RIGHT leg  Iron deficiency anemia  Current Therapy:   Revlimid 5mg  po q day (21 on/7 off) - start on 04/07/2018   Zometa 4 mg IV q 3 months - next dose is 02/2023 Xarelto 10 mg PO daily IV Iron as needed  -Feraheme given on 11/29/2018   Interim History:  [name] is here today for follow-up.  He really looks quite good.  He feels good.  He really has had no specific complaints.  He is walking quite a bit.  He is exercising.  He is on Xarelto and doing well with the Xarelto.  He is continues on the Revlimid.  He has had no problems with Revlimid.  He is on low-dose Revlimid.  When we last checked his myeloma studies was no evidence of monoclonal protein in his blood.  The M spike was negative.  His IgG level was 1011 mg/dL.  The Kappa light chain was 3.4 mg/dL.  We do check his iron studies.  The last time we saw him, his ferritin was 138 with an iron saturation 47%.  He has had no nausea or vomiting.  There is been no cough or shortness of breath.  He has had no chest wall pain.  He has had no back discomfort.  Currently, I would say that his performance status is probably ECOG 0. .  Medications:  Allergies as of 12/04/2022       Reactions   Crestor [rosuvastatin Calcium] Other (See Comments)   Achilles tendonitis   Singulair [montelukast] Other (See Comments)   Mood change        Medication List        Accurate as of Dec 04, 2022 10:42 AM. If you have any questions, ask your nurse or doctor.          STOP taking these medications    albuterol 108 (90 Base) MCG/ACT inhaler Commonly known as: VENTOLIN HFA Stopped by: Josph Macho, MD   rosuvastatin 5 MG tablet Commonly known as: CRESTOR Stopped by: Josph Macho, MD       TAKE these medications    acetaminophen 325 MG  tablet Commonly known as: TYLENOL Take 2 tablets (650 mg total) by mouth every 6 (six) hours as needed for mild pain (or Fever >/= 101).   allopurinol 100 MG tablet Commonly known as: ZYLOPRIM TAKE 1 TABLET BY MOUTH EVERY DAY   amLODipine 5 MG tablet Commonly known as: NORVASC TAKE 1 TABLET (5 MG TOTAL) BY MOUTH DAILY.   budesonide-formoterol 160-4.5 MCG/ACT inhaler Commonly known as: Symbicort TAKE 2 PUFFS BY MOUTH TWICE A DAY   colchicine 0.6 MG tablet Take 1 tablet (0.6 mg total) by mouth 2 (two) times daily as needed (when having a gout flare).   FISH OIL HIGH POTENCY PO Take by mouth daily.   fluorouracil 5 % cream Commonly known as: EFUDEX Apply 1 Application topically 2 (two) times daily as needed (For rash on head).   gabapentin 300 MG capsule Commonly known as: NEURONTIN Take 1 capsule (300 mg total) by mouth 2 (two) times daily.   ipratropium 0.03 % nasal spray Commonly known as: ATROVENT PLACE 1-2 SPRAYS INTO BOTH NOSTRILS 2 (TWO) TIMES DAILY AS NEEDED (NASAL DRAINAGE).   lenalidomide 5 MG capsule Commonly known as: REVLIMID TAKE 1 CAPSULE BY MOUTH DAILY  FOR 21 DAYS, THEN 7 DAYS OFF Auth# [id number]   loperamide 2 MG capsule Commonly known as: IMODIUM Take 2 mg by mouth as needed for diarrhea or loose stools.   valsartan 160 MG tablet Commonly known as: DIOVAN Take 1 tablet (160 mg total) by mouth daily.   Vitamin D3 250 MCG (10000 UT) capsule Take 10,000 Units by mouth daily.   Xarelto 10 MG Tabs tablet Generic drug: rivaroxaban TAKE 1 TABLET BY MOUTH EVERY DAY   zolpidem 5 MG tablet Commonly known as: AMBIEN TAKE 1 TABLET BY MOUTH AT BEDTIME AS NEEDED FOR SLEEP.        Allergies:  Allergies  Allergen Reactions   Crestor [Rosuvastatin Calcium] Other (See Comments)    Achilles tendonitis   Singulair [Montelukast] Other (See Comments)    Mood change    Past Medical History, Surgical history, Social history, and Family History were  reviewed and updated.  Review of Systems: Review of Systems  Constitutional: Negative.   HENT: Negative.   Eyes: Negative.   Respiratory: Negative.   Cardiovascular: Negative.   Gastrointestinal: Negative.   Genitourinary: Negative.   Musculoskeletal: Positive for myalgias.  Skin: Negative.   Neurological: Negative.   Endo/Heme/Allergies: Negative.   Psychiatric/Behavioral: Negative.      Physical Exam:  height is 5\' 7"  (1.702 m) and weight is 185 lb (83.9 kg). His oral temperature is 98.7 F (37.1 C). His blood pressure is 100/75 and his pulse is 63. His respiration is 18 and oxygen saturation is 100%.   Wt Readings from Last 3 Encounters:  12/04/22 185 lb (83.9 kg)  11/27/22 185 lb 8 oz (84.1 kg)  10/07/22 188 lb 6.4 oz (85.5 kg)    Physical Exam Vitals signs reviewed.  HENT:     Head: Normocephalic and atraumatic.  Eyes:     Pupils: Pupils are equal, round, and reactive to light.  Neck:     Musculoskeletal: Normal range of motion.  Cardiovascular:     Rate and Rhythm: Normal rate and regular rhythm.     Heart sounds: Normal heart sounds.  Pulmonary:     Effort: Pulmonary effort is normal.     Breath sounds: Normal breath sounds.  Abdominal:     General: Bowel sounds are normal.     Palpations: Abdomen is soft.  Musculoskeletal: Normal range of motion.        General: No tenderness or deformity.  Lymphadenopathy:     Cervical: No cervical adenopathy.  Skin:    General: Skin is warm and dry.     Findings: No erythema or rash.  Neurological:     Mental Status: He is alert and oriented to person, place, and time.  Psychiatric:        Behavior: Behavior normal.        Thought Content: Thought content normal.        Judgment: Judgment normal.      Lab Results  Component Value Date   WBC 3.7 (L) 12/04/2022   HGB 15.4 12/04/2022   HCT 44.8 12/04/2022   MCV 102.1 (H) 12/04/2022   PLT 110 (L) 12/04/2022   Lab Results  Component Value Date   FERRITIN  138 08/22/2022   IRON 173 08/22/2022   TIBC 370 08/22/2022   UIBC 197 08/22/2022   IRONPCTSAT 47 (H) 08/22/2022   Lab Results  Component Value Date   RETICCTPCT 1.8 08/22/2022   RBC 4.39 12/04/2022   Lab Results  Component Value Date  KPAFRELGTCHN 34.3 (H) 10/02/2022   LAMBDASER 23.6 10/02/2022   KAPLAMBRATIO 1.45 10/02/2022   Lab Results  Component Value Date   IGGSERUM 1,011 10/02/2022   IGA 394 10/02/2022   IGMSERUM 33 10/02/2022   Lab Results  Component Value Date   TOTALPROTELP 6.4 10/02/2022   ALBUMINELP 3.9 10/02/2022   A1GS 0.2 10/02/2022   A2GS 0.4 10/02/2022   BETS 0.8 10/02/2022   GAMS 1.0 10/02/2022   MSPIKE Not Observed 10/02/2022   SPEI Comment 01/05/2018     Chemistry      Component Value Date/Time   NA 137 12/04/2022 0928   NA 143 06/30/2017 1049   NA 140 10/03/2016 0845   K 4.0 12/04/2022 0928   K 4.0 06/30/2017 1049   K 4.3 10/03/2016 0845   CL 104 12/04/2022 0928   CL 105 06/30/2017 1049   CO2 23 12/04/2022 0928   CO2 27 06/30/2017 1049   CO2 26 10/03/2016 0845   BUN 14 12/04/2022 0928   BUN 13 06/30/2017 1049   BUN 11.9 10/03/2016 0845   CREATININE 0.99 12/04/2022 0928   CREATININE 1.0 06/30/2017 1049   CREATININE 0.9 10/03/2016 0845      Component Value Date/Time   CALCIUM 9.7 12/04/2022 0928   CALCIUM 9.0 06/30/2017 1049   CALCIUM 9.9 10/03/2016 0845   ALKPHOS 41 12/04/2022 0928   ALKPHOS 57 06/30/2017 1049   ALKPHOS 80 10/03/2016 0845   AST 42 (H) 12/04/2022 0928   AST 28 10/03/2016 0845   ALT 43 12/04/2022 0928   ALT 43 06/30/2017 1049   ALT 26 10/03/2016 0845   BILITOT 1.0 12/04/2022 0928   BILITOT 0.48 10/03/2016 0845      Impression and Plan: Mr. Reber is a very nice 66 yo gentleman with IgG kappa myeloma.  He ultimately underwent an autologous stem cell transplant at [facility] in February 2018.  He currently is on maintenance Revlimid.  Everything looks fantastic.  He gets his Zometa today.  We will try to get him  through most of summer.  Will have him come back in August.  I know that he will always be busy with his family.   Josph Macho, MD 5/23/202410:42 AM

## 2022-12-05 LAB — KAPPA/LAMBDA LIGHT CHAINS
Kappa free light chain: 31.3 mg/L — ABNORMAL HIGH (ref 3.3–19.4)
Kappa, lambda light chain ratio: 1.24 (ref 0.26–1.65)
Lambda free light chains: 25.3 mg/L (ref 5.7–26.3)

## 2022-12-05 LAB — IGG, IGA, IGM
IgA: 405 mg/dL (ref 61–437)
IgG (Immunoglobin G), Serum: 1013 mg/dL (ref 603–1613)
IgM (Immunoglobulin M), Srm: 34 mg/dL (ref 20–172)

## 2022-12-08 LAB — PROTEIN ELECTROPHORESIS, SERUM, WITH REFLEX
A/G Ratio: 1.4 (ref 0.7–1.7)
Albumin ELP: 3.9 g/dL (ref 2.9–4.4)
Alpha-1-Globulin: 0.2 g/dL (ref 0.0–0.4)
Alpha-2-Globulin: 0.5 g/dL (ref 0.4–1.0)
Beta Globulin: 1 g/dL (ref 0.7–1.3)
Gamma Globulin: 1 g/dL (ref 0.4–1.8)
Globulin, Total: 2.7 g/dL (ref 2.2–3.9)
Total Protein ELP: 6.6 g/dL (ref 6.0–8.5)

## 2022-12-26 ENCOUNTER — Other Ambulatory Visit: Payer: Self-pay | Admitting: *Deleted

## 2022-12-26 DIAGNOSIS — C9000 Multiple myeloma not having achieved remission: Secondary | ICD-10-CM

## 2022-12-26 MED ORDER — LENALIDOMIDE 5 MG PO CAPS
ORAL_CAPSULE | ORAL | 0 refills | Status: DC
Start: 2022-12-26 — End: 2023-01-30

## 2023-01-30 ENCOUNTER — Other Ambulatory Visit: Payer: Self-pay | Admitting: Hematology & Oncology

## 2023-01-30 ENCOUNTER — Other Ambulatory Visit: Payer: Self-pay

## 2023-01-30 DIAGNOSIS — C9000 Multiple myeloma not having achieved remission: Secondary | ICD-10-CM

## 2023-01-30 MED ORDER — LENALIDOMIDE 5 MG PO CAPS
ORAL_CAPSULE | ORAL | 0 refills | Status: DC
Start: 2023-01-30 — End: 2023-03-02

## 2023-02-03 ENCOUNTER — Other Ambulatory Visit: Payer: Self-pay | Admitting: Medical

## 2023-02-03 NOTE — Telephone Encounter (Signed)
Requesting: ambien Contract:10/29/22 UDS:10/29/22 Last Visit:10/07/22 Next Visit:04/07/23 Last Refill:10/17/22  Please Advise

## 2023-02-04 ENCOUNTER — Other Ambulatory Visit: Payer: Self-pay | Admitting: Medical

## 2023-02-04 DIAGNOSIS — L03032 Cellulitis of left toe: Secondary | ICD-10-CM

## 2023-02-05 NOTE — Telephone Encounter (Signed)
Sent in refill. Pt needs follow up controlled med visit in September. Will you check to see if med went to pharmacy. [employer] controlled med app may have glitched.

## 2023-02-26 ENCOUNTER — Encounter (INDEPENDENT_AMBULATORY_CARE_PROVIDER_SITE_OTHER): Payer: Self-pay

## 2023-03-01 ENCOUNTER — Other Ambulatory Visit: Payer: Self-pay | Admitting: Hematology & Oncology

## 2023-03-01 DIAGNOSIS — C9000 Multiple myeloma not having achieved remission: Secondary | ICD-10-CM

## 2023-03-02 ENCOUNTER — Encounter: Payer: Self-pay | Admitting: Hematology & Oncology

## 2023-03-02 ENCOUNTER — Telehealth: Payer: Self-pay

## 2023-03-02 NOTE — Telephone Encounter (Signed)
Received phone call from [facility] needing an authorization number for Revlimid prescription. This RN called [facility] and gave pharmacist authorization number [id number] for revlimid prescription that is good for 30 days.  Per [facility] they will call patient to let him know medication is available to fill.

## 2023-03-06 ENCOUNTER — Inpatient Hospital Stay: Payer: Medicare Other

## 2023-03-06 ENCOUNTER — Encounter: Payer: Self-pay | Admitting: Hematology & Oncology

## 2023-03-06 ENCOUNTER — Inpatient Hospital Stay: Payer: Medicare Other | Attending: Hematology & Oncology

## 2023-03-06 ENCOUNTER — Inpatient Hospital Stay: Payer: Medicare Other | Admitting: Hematology & Oncology

## 2023-03-06 ENCOUNTER — Encounter: Payer: Self-pay | Admitting: *Deleted

## 2023-03-06 ENCOUNTER — Other Ambulatory Visit: Payer: Self-pay

## 2023-03-06 VITALS — BP 93/69 | HR 59 | Temp 98.2°F | Resp 18 | Ht 67.0 in | Wt 181.0 lb

## 2023-03-06 DIAGNOSIS — Z79899 Other long term (current) drug therapy: Secondary | ICD-10-CM | POA: Diagnosis not present

## 2023-03-06 DIAGNOSIS — C9000 Multiple myeloma not having achieved remission: Secondary | ICD-10-CM

## 2023-03-06 DIAGNOSIS — D509 Iron deficiency anemia, unspecified: Secondary | ICD-10-CM | POA: Diagnosis not present

## 2023-03-06 DIAGNOSIS — Z86718 Personal history of other venous thrombosis and embolism: Secondary | ICD-10-CM | POA: Insufficient documentation

## 2023-03-06 DIAGNOSIS — Z7901 Long term (current) use of anticoagulants: Secondary | ICD-10-CM | POA: Insufficient documentation

## 2023-03-06 DIAGNOSIS — Z9484 Stem cells transplant status: Secondary | ICD-10-CM | POA: Diagnosis not present

## 2023-03-06 DIAGNOSIS — D500 Iron deficiency anemia secondary to blood loss (chronic): Secondary | ICD-10-CM

## 2023-03-06 LAB — CMP (CANCER CENTER ONLY)
ALT: 41 U/L (ref 0–44)
AST: 39 U/L (ref 15–41)
Albumin: 4.4 g/dL (ref 3.5–5.0)
Alkaline Phosphatase: 47 U/L (ref 38–126)
Anion gap: 8 (ref 5–15)
BUN: 13 mg/dL (ref 8–23)
CO2: 24 mmol/L (ref 22–32)
Calcium: 9.2 mg/dL (ref 8.9–10.3)
Chloride: 105 mmol/L (ref 98–111)
Creatinine: 0.97 mg/dL (ref 0.61–1.24)
GFR, Estimated: >60 mL/min (ref >60)
Glucose, Bld: 95 mg/dL (ref 70–99)
Potassium: 4.2 mmol/L (ref 3.5–5.1)
Sodium: 137 mmol/L (ref 135–145)
Total Bilirubin: 1.2 mg/dL (ref 0.3–1.2)
Total Protein: 7.1 g/dL (ref 6.5–8.1)

## 2023-03-06 LAB — CBC WITH DIFFERENTIAL (CANCER CENTER ONLY)
Abs Immature Granulocytes: 0.01 10*3/uL (ref 0.00–0.07)
Basophils Absolute: 0 10*3/uL (ref 0.0–0.1)
Basophils Relative: 1 %
Eosinophils Absolute: 0.2 10*3/uL (ref 0.0–0.5)
Eosinophils Relative: 6 %
HCT: 44.9 % (ref 39.0–52.0)
Hemoglobin: 15.7 g/dL (ref 13.0–17.0)
Immature Granulocytes: 0 %
Lymphocytes Relative: 56 %
Lymphs Abs: 1.9 10*3/uL (ref 0.7–4.0)
MCH: 35.3 pg — ABNORMAL HIGH (ref 26.0–34.0)
MCHC: 35 g/dL (ref 30.0–36.0)
MCV: 100.9 fL — ABNORMAL HIGH (ref 80.0–100.0)
Monocytes Absolute: 0.4 10*3/uL (ref 0.1–1.0)
Monocytes Relative: 12 %
Neutro Abs: 0.9 10*3/uL — ABNORMAL LOW (ref 1.7–7.7)
Neutrophils Relative %: 25 %
Platelet Count: 121 10*3/uL — ABNORMAL LOW (ref 150–400)
RBC: 4.45 MIL/uL (ref 4.22–5.81)
RDW: 13.9 % (ref 11.5–15.5)
WBC Count: 3.5 10*3/uL — ABNORMAL LOW (ref 4.0–10.5)
nRBC: 0 % (ref 0.0–0.2)

## 2023-03-06 LAB — LACTATE DEHYDROGENASE: LDH: 181 U/L (ref 98–192)

## 2023-03-06 LAB — IRON AND IRON BINDING CAPACITY (CC-WL,HP ONLY)
Iron: 116 ug/dL (ref 45–182)
Saturation Ratios: 33 % (ref 17.9–39.5)
TIBC: 347 ug/dL (ref 250–450)
UIBC: 231 ug/dL (ref 117–376)

## 2023-03-06 LAB — FERRITIN: Ferritin: 105 ng/mL (ref 24–336)

## 2023-03-06 MED ORDER — SODIUM CHLORIDE 0.9 % IV SOLN: Freq: Once | INTRAVENOUS | Status: AC

## 2023-03-06 MED ORDER — ZOLEDRONIC ACID 4 MG/100ML IV SOLN
4.0000 mg | Freq: Once | INTRAVENOUS | Status: AC
  Administered 2023-03-06: 4 mg via INTRAVENOUS
  Filled 2023-03-06: qty 100

## 2023-03-06 NOTE — Patient Instructions (Signed)

## 2023-03-06 NOTE — Progress Notes (Signed)
Hematology and Oncology Follow Up Visit  Jorge [name] [id number] [birth year] 67 y.o. 03/06/2023   Principle Diagnosis:  IgG Kappa myeloma - Hyperdiploid/+11 DVT of the LEFT and RIGHT leg  Iron deficiency anemia  Current Therapy:   Revlimid 5mg  po q day (21 on/7 off) - start on 04/07/2018   Zometa 4 mg IV q 3 months - next dose is 05/2023 Xarelto 10 mg PO daily IV Iron as needed  -Feraheme given on 11/29/2018   Interim History:  Jorge [name] is here today for follow-up.  He is doing quite nicely.  I think we last saw him about 3 months ago.  He really has had no problems since we last saw him.  However, he does have a squamous cell on his lower leg.  He is going to have this removed.  He has had no problem with diarrhea.  There is been no cough or shortness of breath.  He continues on maintenance Xarelto.  He has had no problems with fever.  He has had no bleeding.  He has had no obvious change in bowel or bladder habits.  When we last saw him, there was no monoclonal spike in his blood.  His IgG level was 1000 mg/dL.  The Kappa light chain was 3.1 mg/dL.  His last iron studies that were done back in May showed a ferritin of 86 with an iron saturation of 40%.  He has had no headache.  There is been no nausea or vomiting.  He will be traveling quite a bit at the end of the year.  He is going to retire in December.  Overall, I would say that his performance status is probably ECOG 0.    Medications:  Allergies as of 03/06/2023       Reactions   Crestor [rosuvastatin Calcium] Other (See Comments)   Achilles tendonitis   Singulair [montelukast] Other (See Comments)   Mood change        Medication List        Accurate as of March 06, 2023 10:46 AM. If you have any questions, ask your nurse or doctor.          acetaminophen 325 MG tablet Commonly known as: TYLENOL Take 2 tablets (650 mg total) by mouth every 6 (six) hours as needed for mild pain (or Fever >/= 101).    allopurinol 100 MG tablet Commonly known as: ZYLOPRIM Take 1 tablet (100 mg total) by mouth daily.   amLODipine 5 MG tablet Commonly known as: NORVASC TAKE 1 TABLET (5 MG TOTAL) BY MOUTH DAILY.   budesonide-formoterol 160-4.5 MCG/ACT inhaler Commonly known as: Symbicort TAKE 2 PUFFS BY MOUTH TWICE A DAY   colchicine 0.6 MG tablet Take 1 tablet (0.6 mg total) by mouth 2 (two) times daily as needed (when having a gout flare).   FISH OIL HIGH POTENCY PO Take by mouth daily.   fluorouracil 5 % cream Commonly known as: EFUDEX Apply 1 Application topically 2 (two) times daily as needed (For rash on head).   gabapentin 300 MG capsule Commonly known as: NEURONTIN Take 1 capsule (300 mg total) by mouth 2 (two) times daily.   ipratropium 0.03 % nasal spray Commonly known as: ATROVENT PLACE 1-2 SPRAYS INTO BOTH NOSTRILS 2 (TWO) TIMES DAILY AS NEEDED (NASAL DRAINAGE).   lenalidomide 5 MG capsule Commonly known as: REVLIMID TAKE 1 CAPSULE BY MOUTH DAILY  FOR 21 DAYS, THEN 7 DAYS OFF   loperamide 2 MG capsule Commonly known as: IMODIUM  Take 2 mg by mouth as needed for diarrhea or loose stools.   valsartan 160 MG tablet Commonly known as: DIOVAN Take 1 tablet (160 mg total) by mouth daily.   Vitamin D3 250 MCG (10000 UT) capsule Take 10,000 Units by mouth daily.   Xarelto 10 MG Tabs tablet Generic drug: rivaroxaban TAKE 1 TABLET BY MOUTH EVERY DAY   zolpidem 5 MG tablet Commonly known as: AMBIEN TAKE 1 TABLET BY MOUTH EVERY DAY AT BEDTIME AS NEEDED FOR SLEEP        Allergies:  Allergies  Allergen Reactions   Crestor [Rosuvastatin Calcium] Other (See Comments)    Achilles tendonitis   Singulair [Montelukast] Other (See Comments)    Mood change    Past Medical History, Surgical history, Social history, and Family History were reviewed and updated.  Review of Systems: Review of Systems  Constitutional: Negative.   HENT: Negative.   Eyes: Negative.    Respiratory: Negative.   Cardiovascular: Negative.   Gastrointestinal: Negative.   Genitourinary: Negative.   Musculoskeletal: Positive for myalgias.  Skin: Negative.   Neurological: Negative.   Endo/Heme/Allergies: Negative.   Psychiatric/Behavioral: Negative.      Physical Exam:  height is 5\' 7"  (1.702 m) and weight is 181 lb (82.1 kg). His oral temperature is 98.2 F (36.8 C). His blood pressure is 93/69 and his pulse is 59 (abnormal). His respiration is 18 and oxygen saturation is 99%.   Wt Readings from Last 3 Encounters:  03/06/23 181 lb (82.1 kg)  12/04/22 185 lb (83.9 kg)  11/27/22 185 lb 8 oz (84.1 kg)    Physical Exam Vitals signs reviewed.  HENT:     Head: Normocephalic and atraumatic.  Eyes:     Pupils: Pupils are equal, round, and reactive to light.  Neck:     Musculoskeletal: Normal range of motion.  Cardiovascular:     Rate and Rhythm: Normal rate and regular rhythm.     Heart sounds: Normal heart sounds.  Pulmonary:     Effort: Pulmonary effort is normal.     Breath sounds: Normal breath sounds.  Abdominal:     General: Bowel sounds are normal.     Palpations: Abdomen is soft.  Musculoskeletal: Normal range of motion.        General: No tenderness or deformity.  Lymphadenopathy:     Cervical: No cervical adenopathy.  Skin:    General: Skin is warm and dry.     Findings: No erythema or rash.  Neurological:     Mental Status: He is alert and oriented to person, place, and time.  Psychiatric:        Behavior: Behavior normal.        Thought Content: Thought content normal.        Judgment: Judgment normal.      Lab Results  Component Value Date   WBC 3.5 (L) 03/06/2023   HGB 15.7 03/06/2023   HCT 44.9 03/06/2023   MCV 100.9 (H) 03/06/2023   PLT 121 (L) 03/06/2023   Lab Results  Component Value Date   FERRITIN 86 12/04/2022   IRON 138 12/04/2022   TIBC 346 12/04/2022   UIBC 208 12/04/2022   IRONPCTSAT 40 (H) 12/04/2022   Lab  Results  Component Value Date   RETICCTPCT 1.8 08/22/2022   RBC 4.45 03/06/2023   Lab Results  Component Value Date   KPAFRELGTCHN 31.3 (H) 12/04/2022   LAMBDASER 25.3 12/04/2022   KAPLAMBRATIO 1.24 12/04/2022   Lab Results  Component Value Date   IGGSERUM 1,013 12/04/2022   IGA 405 12/04/2022   IGMSERUM 34 12/04/2022   Lab Results  Component Value Date   TOTALPROTELP 6.6 12/04/2022   ALBUMINELP 3.9 12/04/2022   A1GS 0.2 12/04/2022   A2GS 0.5 12/04/2022   BETS 1.0 12/04/2022   GAMS 1.0 12/04/2022   MSPIKE Not Observed 12/04/2022   SPEI Comment 01/05/2018     Chemistry      Component Value Date/Time   NA 137 03/06/2023 0934   NA 143 06/30/2017 1049   NA 140 10/03/2016 0845   K 4.2 03/06/2023 0934   K 4.0 06/30/2017 1049   K 4.3 10/03/2016 0845   CL 105 03/06/2023 0934   CL 105 06/30/2017 1049   CO2 24 03/06/2023 0934   CO2 27 06/30/2017 1049   CO2 26 10/03/2016 0845   BUN 13 03/06/2023 0934   BUN 13 06/30/2017 1049   BUN 11.9 10/03/2016 0845   CREATININE 0.97 03/06/2023 0934   CREATININE 1.0 06/30/2017 1049   CREATININE 0.9 10/03/2016 0845      Component Value Date/Time   CALCIUM 9.2 03/06/2023 0934   CALCIUM 9.0 06/30/2017 1049   CALCIUM 9.9 10/03/2016 0845   ALKPHOS 47 03/06/2023 0934   ALKPHOS 57 06/30/2017 1049   ALKPHOS 80 10/03/2016 0845   AST 39 03/06/2023 0934   AST 28 10/03/2016 0845   ALT 41 03/06/2023 0934   ALT 43 06/30/2017 1049   ALT 26 10/03/2016 0845   BILITOT 1.2 03/06/2023 0934   BILITOT 0.48 10/03/2016 0845      Impression and Plan: Jorge [name] is a very nice 67 yo gentleman with IgG kappa myeloma.  He ultimately underwent an autologous stem cell transplant at [facility] in February 2018.  He currently is on maintenance Revlimid.  Everything looks fantastic.  He gets his Zometa today.  I am just so happy that he is doing so well.  I am very happy that he is going to retire.  He really deserves to retire.  We will go ahead and plan to  get him back in 3 more months.     Josph Macho, MD 8/23/202410:46 AM

## 2023-03-08 LAB — IGG, IGA, IGM
IgA: 422 mg/dL (ref 61–437)
IgG (Immunoglobin G), Serum: 1033 mg/dL (ref 603–1613)
IgM (Immunoglobulin M), Srm: 29 mg/dL (ref 20–172)

## 2023-03-09 LAB — KAPPA/LAMBDA LIGHT CHAINS
Kappa free light chain: 34.9 mg/L — ABNORMAL HIGH (ref 3.3–19.4)
Kappa, lambda light chain ratio: 1.36 (ref 0.26–1.65)
Lambda free light chains: 25.6 mg/L (ref 5.7–26.3)

## 2023-03-17 ENCOUNTER — Other Ambulatory Visit: Payer: Self-pay | Admitting: *Deleted

## 2023-03-17 ENCOUNTER — Other Ambulatory Visit: Payer: Self-pay | Admitting: Medical

## 2023-03-17 ENCOUNTER — Telehealth: Payer: Self-pay | Admitting: *Deleted

## 2023-03-17 DIAGNOSIS — L03032 Cellulitis of left toe: Secondary | ICD-10-CM

## 2023-03-17 DIAGNOSIS — C9000 Multiple myeloma not having achieved remission: Secondary | ICD-10-CM

## 2023-03-17 NOTE — Telephone Encounter (Signed)
Patient called trying to decipher his myeloma labs but couldn't find the MSpike.  SPEP was not done on last lab visit.  SPEP added to labwork that is to be collected alongside the Myeloma kit on 9.9.24.  Patient aware and is fine with this arrangement.

## 2023-03-20 ENCOUNTER — Other Ambulatory Visit: Payer: Self-pay | Admitting: *Deleted

## 2023-03-20 DIAGNOSIS — C9000 Multiple myeloma not having achieved remission: Secondary | ICD-10-CM

## 2023-03-20 MED ORDER — LENALIDOMIDE 5 MG PO CAPS: ORAL_CAPSULE | ORAL | 0 refills | Status: DC

## 2023-03-23 ENCOUNTER — Inpatient Hospital Stay: Payer: Medicare Other | Attending: Hematology & Oncology

## 2023-04-06 ENCOUNTER — Ambulatory Visit: Payer: Medicare Other

## 2023-04-06 ENCOUNTER — Inpatient Hospital Stay: Payer: Medicare Other

## 2023-04-06 ENCOUNTER — Ambulatory Visit: Payer: Medicare Other | Admitting: Hematology & Oncology

## 2023-04-07 ENCOUNTER — Ambulatory Visit: Payer: Medicare Other | Admitting: Medical

## 2023-04-07 ENCOUNTER — Encounter: Payer: Self-pay | Admitting: Medical

## 2023-04-07 VITALS — BP 110/60 | HR 55 | Temp 98.3°F | Resp 18 | Ht 67.0 in | Wt 182.4 lb

## 2023-04-07 DIAGNOSIS — Z86718 Personal history of other venous thrombosis and embolism: Secondary | ICD-10-CM

## 2023-04-07 DIAGNOSIS — I10 Essential (primary) hypertension: Secondary | ICD-10-CM | POA: Diagnosis not present

## 2023-04-07 DIAGNOSIS — F419 Anxiety disorder, unspecified: Secondary | ICD-10-CM

## 2023-04-07 DIAGNOSIS — C9001 Multiple myeloma in remission: Secondary | ICD-10-CM

## 2023-04-07 DIAGNOSIS — M1000 Idiopathic gout, unspecified site: Secondary | ICD-10-CM

## 2023-04-07 DIAGNOSIS — G629 Polyneuropathy, unspecified: Secondary | ICD-10-CM

## 2023-04-07 NOTE — Patient Instructions (Addendum)
Neuropathy - minimal recently. Gabapentin should help this as well.  idiopathic gout, unspecified site - controlled with colchicine as needed dose.   Hypertension, unspecified type -bp tightly controlled with valsartan and amlodipine   Anxiety -continue as needed ambien. Up to date on uds and contract.  History of DVT (deep vein thrombosis) -continue xarelto.  Multiple myeloma in remission [facility])  -followed by hematologist.  Follow up 6 month or sooner if needed.

## 2023-04-07 NOTE — Progress Notes (Signed)
Subjective:    Patient ID: Jorge [name], male    DOB: [birth year], 67 y.o.   MRN: [id number]  HPI  Pt in for follow up.  Pt states about to retire. Has a lot of upcoming travel plans.   Pt has multiple myeloma recently. Status in remission. Just saw [facility] hematology.  Pt still has insomnia. Pt uses ambien 5 mg at night.  Up to date on drug screen and contract.   Gout controlled presenlty. But has flare at time every 2 months. Responds quickly with colchicine.  Hx of dvt. Still on xarelto.  Htn- pt is on valsartan and amlodipine. Tightly controlled. No dizziness on changing positions/standing. Cardiologist considered dropping amlodipne if too low. Pt states bp usually 110-120 systolic.  Rare wheezing. Uses symbicort presently only as needed basis. He states about twice a year.  Hx of cough. Still using gabapentin for cough. Rx by pulmonolgist.  Mild neuropathy. Gabapentin helping treat this as well.  Hematologist has placed future labs. Pt states will get those in November.  Just got flu and covid vaccine 10 days ago.  Review of Systems  Constitutional:  Negative for chills, fatigue and fever.  HENT:  Negative for congestion, ear pain and postnasal drip.   Respiratory:  Negative for cough, chest tightness, shortness of breath and wheezing.   Cardiovascular:  Negative for chest pain and palpitations.  Gastrointestinal:  Negative for abdominal pain and diarrhea.  Genitourinary:  Negative for dysuria and hematuria.  Musculoskeletal:  Negative for back pain and myalgias.  Skin:  Negative for rash.  Neurological:  Negative for dizziness, syncope, weakness and light-headedness.  Hematological:  Negative for adenopathy. Does not bruise/bleed easily.  Psychiatric/Behavioral:  Negative for behavioral problems, confusion and suicidal ideas. The patient is not nervous/anxious.     Past Medical History:  Diagnosis Date   Allergy    Anxiety    Asthma    Blood transfusion without  reported diagnosis 2018   Bone metastasis    Chest cold 05/19/2016   productive cough  -- started on antibiotic   Chronic back pain    due to bone mets from myeloma   Cough    Depression    Diverticulitis    GERD (gastroesophageal reflux disease)    Hiatal hernia    History of chicken pox    History of concussion    age 66 -- no residual   History of DVT of lower extremity 03/21/2016  treated and completed w/ xarelto   per doppler left extensive occlusion common femoral, femoral, and popliteal veins and right partial occlusion common femoral and profunda femoral veins/  last doppler 06-04-2016 no evidence acute or chronic dvt noted either leg    History of radiation therapy 06/09/16-06/23/16   lower thoracic spine 25 Gy in 10 fractions   Mouth ulcers    secondary to radiation   Multiple myeloma (HCC) dx 02/22/2016 via bone marrow bx---  oncologist-  dr Myna Hidalgo   IgG Kappa-- Hyperdiploid/ +11 w/ bone mets--  current treatment chemotherapy (started 08/ 2017)and pallitive radiation to back started 06-09-2016   Renal calculus, right    Wears contact lenses      Social History   Socioeconomic History   Marital status: Married    Spouse name: Not on file   Number of children: 3   Years of education: Not on file   Highest education level: Not on file  Occupational History   Not on file  Tobacco Use  Smoking status: Former    Current packs/day: 0.00    Average packs/day: 1 pack/day for 7.0 years (7.0 ttl pk-yrs)    Types: Cigarettes    Start date: 11/02/1973    Quit date: 11/02/1980    Years since quitting: 42.4   Smokeless tobacco: Never  Vaping Use   Vaping status: Never Used  Substance and Sexual Activity   Alcohol use: Yes    Comment: occasional   Drug use: No   Sexual activity: Not Currently  Other Topics Concern   Not on file  Social History Narrative   Not on file   Social Determinants of Health   Financial Resource Strain: Low Risk  (08/21/2022)   Overall  Financial Resource Strain (CARDIA)    Difficulty of Paying Living Expenses: Not hard at all  Food Insecurity: No Food Insecurity (08/21/2022)   Hunger Vital Sign    Worried About Running Out of Food in the Last Year: Never true    Ran Out of Food in the Last Year: Never true  Transportation Needs: No Transportation Needs (08/21/2022)   PRAPARE - Administrator, Civil Service (Medical): No    Lack of Transportation (Non-Medical): No  Physical Activity: Sufficiently Active (07/02/2021)   Exercise Vital Sign    Days of Exercise per Week: 7 days    Minutes of Exercise per Session: 60 min  Stress: No Stress Concern Present (08/21/2022)   [employer] of Occupational Health - Occupational Stress Questionnaire    Feeling of Stress : Not at all  Social Connections: Moderately Integrated (08/21/2022)   Social Connection and Isolation Panel [NHANES]    Frequency of Communication with Friends and Family: Three times a week    Frequency of Social Gatherings with Friends and Family: Twice a week    Attends Religious Services: Never    Database administrator or Organizations: Yes    Attends Banker Meetings: 1 to 4 times per year    Marital Status: Married  Catering manager Violence: Not At Risk (08/21/2022)   Humiliation, Afraid, Rape, and Kick questionnaire    Fear of Current or Ex-Partner: No    Emotionally Abused: No    Physically Abused: No    Sexually Abused: No    Past Surgical History:  Procedure Laterality Date   COLONOSCOPY  2008,2013   COLONOSCOPY  01/07/2017   Dr Evette Cristal, [facility]   CYSTOSCOPY W/ URETERAL STENT PLACEMENT Right 06/20/2016   Procedure: CYSTOSCOPY WITH STENT REPLACEMENT;  Surgeon: Ihor Gully, MD;  [facility];  Service: Urology;  Laterality: Right;   CYSTOSCOPY WITH RETROGRADE PYELOGRAM, URETEROSCOPY AND STENT PLACEMENT Right 05/30/2016   Procedure: CYSTOSCOPY WITH RETROGRADE PYELOGRAM, URETEROSCOPY AND STENT  PLACEMENT,DILITATION URETERAL STRICTURE;  Surgeon: Ihor Gully, MD;  Location: [facility];  Service: Urology;  Laterality: Right;   CYSTOSCOPY/RETROGRADE/URETEROSCOPY/STONE EXTRACTION WITH BASKET Right 06/20/2016   Procedure: CYSTOSCOPY/URETEROSCOPY/STONE EXTRACTION WITH BASKET;  Surgeon: Ihor Gully, MD;  [facility];  Service: Urology;  Laterality: Right;   HIATAL HERNIA REPAIR N/A 08/13/2018   Procedure: LAPAROSCOPIC REPAIR OF HIATAL HERNIA REPAIR WITH FUNDOPLICATION AND INSERTION OF MESH;  Surgeon: Glenna Fellows, MD;  Location: [facility];  Service: General;  Laterality: N/A;   HOLMIUM LASER APPLICATION Right 06/20/2016   Procedure: HOLMIUM LASER APPLICATION;  Surgeon: Ihor Gully, MD;  Location[facility];  Service: Urology;  Laterality: Right;   IR GENERIC HISTORICAL  02/11/2016   IR RADIOLOGIST EVAL &  MGMT 02/11/2016 [facility]   IR GENERIC HISTORICAL  02/15/2016   IR BONE TUMOR(S)RF ABLATION 02/15/2016 Julieanne Cotton, MD [facility]   IR GENERIC HISTORICAL  02/15/2016   IR BONE TUMOR(S)RF ABLATION 02/15/2016 Julieanne Cotton, MD [facility]   IR GENERIC HISTORICAL  02/15/2016   IR BONE TUMOR(S)RF ABLATION 02/15/2016 Julieanne Cotton, MD [facility]   IR GENERIC HISTORICAL  02/15/2016   IR KYPHO THORACIC WITH BONE BIOPSY 02/15/2016 Julieanne Cotton, MD [facility]   IR GENERIC HISTORICAL  02/15/2016   IR KYPHO THORACIC WITH BONE BIOPSY 02/15/2016 Julieanne Cotton, MD [facility]   IR GENERIC HISTORICAL  02/15/2016   IR VERTEBROPLASTY CERV/THOR BX INC UNI/BIL INC/INJECT/IMAGING 02/15/2016 Julieanne Cotton, MD [facility]   IR GENERIC HISTORICAL  03/13/2016   IR KYPHO EA ADDL LEVEL THORACIC OR LUMBAR 03/13/2016 Julieanne Cotton, MD [facility]   IR GENERIC HISTORICAL  03/13/2016   IR KYPHO EA ADDL LEVEL THORACIC OR LUMBAR 03/13/2016 Julieanne Cotton, MD [facility]   IR GENERIC HISTORICAL  03/13/2016   IR BONE TUMOR(S)RF ABLATION 03/13/2016 Julieanne Cotton, MD [facility]   IR GENERIC HISTORICAL  03/13/2016   IR KYPHO LUMBAR INC FX REDUCE BONE BX UNI/BIL CANNULATION INC/IMAGING 03/13/2016 Julieanne Cotton, MD [facility]   IR GENERIC HISTORICAL  03/13/2016   IR BONE TUMOR(S)RF ABLATION 03/13/2016 Julieanne Cotton, MD [facility]   IR GENERIC HISTORICAL  03/13/2016   IR BONE TUMOR(S)RF ABLATION 03/13/2016 Julieanne Cotton, MD [facility]   IR GENERIC HISTORICAL  03/31/2016   IR RADIOLOGIST EVAL & MGMT 03/31/2016 [facility]   KYPHOPLASTY     02/2016, 03/2016, 11/2016   LAPAROSCOPIC INGUINAL HERNIA REPAIR Bilateral 12-16-2013  dr gross   RADIOLOGY WITH ANESTHESIA N/A 02/15/2016   Procedure: Spinal Ablation;  Surgeon: Julieanne Cotton, MD;  [facility];  Service: Radiology;  Laterality: N/A;   RADIOLOGY WITH ANESTHESIA N/A 03/13/2016   Procedure: LUMBER ABLATION;  Surgeon: Julieanne Cotton, MD;  Location: [facility];  Service: Radiology;  Laterality: N/A;   ROTATOR CUFF REPAIR Right 2003   spine operation     x1 in 2018 x3 in 2017   TONSILLECTOMY  age 13   VERTEBROPLASTY  01/2016   WISDOM TOOTH EXTRACTION      Family History  Problem Relation Age of Onset   Uterine cancer Mother    Ovarian cancer Mother    Colon cancer Mother        said it was rectal or colon but not sure   Rectal cancer Mother    Heart disease Father    Hypertension Father    Multiple sclerosis Sister    Paranoid behavior Brother    Drug abuse Brother    Schizophrenia Brother    Stroke Maternal Grandfather    Cancer Maternal Aunt    Leukemia Paternal Aunt    Healthy Son        x1   Healthy Daughter        x2   Allergies Daughter        x1   Diabetes Neg Hx    Alzheimer's disease Neg Hx    Parkinson's disease Neg Hx    Esophageal cancer Neg Hx    Stomach cancer Neg Hx     Allergies  Allergen Reactions   Crestor [Rosuvastatin Calcium] Other (See Comments)    Achilles tendonitis   Singulair [Montelukast] Other (See Comments)    Mood change     Current Outpatient Medications on File Prior  to Visit  Medication Sig Dispense Refill   acetaminophen (TYLENOL) 325 MG tablet Take 2 tablets (650 mg total) by mouth every 6 (six) hours as needed for mild pain (or Fever >/= 101).     allopurinol (ZYLOPRIM) 100 MG tablet TAKE 1 TABLET BY MOUTH EVERY DAY 90 tablet 0   amLODipine (NORVASC) 5 MG tablet TAKE 1 TABLET (5 MG TOTAL) BY MOUTH DAILY. 90 tablet 0   Cholecalciferol (VITAMIN D3) 250 MCG (10000 UT) capsule Take 10,000 Units by mouth daily.     colchicine 0.6 MG tablet Take 1 tablet (0.6 mg total) by mouth 2 (two) times daily as needed (when having a gout flare). 30 tablet 0   fluorouracil (EFUDEX) 5 % cream Apply 1 Application topically 2 (two) times daily as needed (For rash on head).     gabapentin (NEURONTIN) 300 MG capsule Take 1 capsule (300 mg total) by mouth 2 (two) times daily. 60 capsule 11   ipratropium (ATROVENT) 0.03 % nasal spray PLACE 1-2 SPRAYS INTO BOTH NOSTRILS 2 (TWO) TIMES DAILY AS NEEDED (NASAL DRAINAGE). 30 mL 5   lenalidomide (REVLIMID) 5 MG capsule TAKE 1 CAPSULE BY MOUTH DAILY  FOR 21 DAYS, THEN 7 DAYS OFF Auth# [id number] 21 capsule 0   loperamide (IMODIUM) 2 MG capsule Take 2 mg by mouth as needed for diarrhea or loose stools.     Omega-3 Fatty Acids (FISH OIL HIGH POTENCY PO) Take by mouth daily.     valsartan (DIOVAN) 160 MG tablet Take 1 tablet (160 mg total) by mouth daily. 30 tablet 11   XARELTO 10 MG TABS tablet TAKE 1 TABLET BY MOUTH EVERY DAY 90 tablet 3   Zoledronic Acid (ZOMETA IV) Inject into the vein every 3 (three) months.     zolpidem (AMBIEN) 5 MG tablet TAKE 1 TABLET BY MOUTH EVERY DAY AT BEDTIME AS NEEDED FOR SLEEP 30 tablet 1   budesonide-formoterol (SYMBICORT) 160-4.5 MCG/ACT inhaler TAKE 2 PUFFS BY MOUTH TWICE A DAY (Patient not taking: Reported on 11/27/2022) 10.2 each 4   Current Facility-Administered Medications on File Prior to Visit  Medication Dose Route Frequency Provider Last Rate Last  Admin   heparin lock flush 100 unit/mL  500 Units Intravenous Once Erenest Blank, NP       sodium chloride flush (NS) 0.9 % injection 3 mL  3 mL Intravenous Once PRN Erenest Blank, NP        BP 110/60   Pulse (!) 55   Temp 98.3 F (36.8 C) (Oral)   Resp 18   Ht 5\' 7"  (1.702 m)   Wt 182 lb 6.4 oz (82.7 kg)   SpO2 99%   BMI 28.57 kg/m        Objective:   Physical Exam  General Mental Status- Alert. General Appearance- Not in acute distress.   Skin General: Color- Normal Color. Moisture- Normal Moisture.  Neck Carotid Arteries- Normal color. Moisture- Normal Moisture. No carotid bruits. No JVD.  Chest and Lung Exam Auscultation: Breath Sounds:-Normal.  Cardiovascular Auscultation:Rythm- Regular. Murmurs & Other Heart Sounds:Auscultation of the heart reveals- No Murmurs.  Abdomen Inspection:-Inspeection Normal. Palpation/Percussion:Note:No mass. Palpation and Percussion of the abdomen reveal- Non Tender, Non Distended + BS, no rebound or guarding.  Neurologic Cranial Nerve exam:- CN III-XII intact(No nystagmus), symmetric smile. Strength:- 5/5 equal and symmetric strength both upper and lower extremities.        Assessment & Plan:   Patient Instructions   Neuropathy - minimal recently. Gabapentin  should help this as well.  idiopathic gout, unspecified site - controlled with colchicine as needed dose.   Hypertension, unspecified type -bp tightly controlled with valsartan and amlodipine   Anxiety -continue as needed ambien. Up to date on uds and contract.  History of DVT (deep vein thrombosis) -continue xarelto.  Multiple myeloma in remission [facility])  -followed by hematologist.  Follow up 6 month or sooner if needed.    Esperanza Richters, PA-C

## 2023-04-28 ENCOUNTER — Telehealth: Payer: Self-pay

## 2023-04-28 ENCOUNTER — Other Ambulatory Visit: Payer: Self-pay | Admitting: Hematology & Oncology

## 2023-04-28 DIAGNOSIS — C9000 Multiple myeloma not having achieved remission: Secondary | ICD-10-CM

## 2023-04-28 NOTE — Telephone Encounter (Signed)
Revlemid Auth [id number]

## 2023-06-02 ENCOUNTER — Other Ambulatory Visit: Payer: Self-pay | Admitting: Allergy

## 2023-06-05 ENCOUNTER — Inpatient Hospital Stay: Payer: Medicare Other

## 2023-06-05 ENCOUNTER — Inpatient Hospital Stay (HOSPITAL_BASED_OUTPATIENT_CLINIC_OR_DEPARTMENT_OTHER): Payer: Medicare Other | Admitting: Hematology & Oncology

## 2023-06-05 ENCOUNTER — Encounter: Payer: Self-pay | Admitting: *Deleted

## 2023-06-05 ENCOUNTER — Encounter: Payer: Self-pay | Admitting: Hematology & Oncology

## 2023-06-05 ENCOUNTER — Inpatient Hospital Stay: Payer: Medicare Other | Attending: Hematology & Oncology

## 2023-06-05 ENCOUNTER — Telehealth: Payer: Self-pay

## 2023-06-05 VITALS — BP 118/79 | HR 53 | Resp 18

## 2023-06-05 VITALS — BP 118/70 | HR 62 | Temp 98.2°F | Resp 18 | Ht 67.0 in | Wt 183.8 lb

## 2023-06-05 DIAGNOSIS — Z7961 Long term (current) use of immunomodulator: Secondary | ICD-10-CM | POA: Diagnosis not present

## 2023-06-05 DIAGNOSIS — D509 Iron deficiency anemia, unspecified: Secondary | ICD-10-CM | POA: Insufficient documentation

## 2023-06-05 DIAGNOSIS — C9000 Multiple myeloma not having achieved remission: Secondary | ICD-10-CM

## 2023-06-05 DIAGNOSIS — Z86718 Personal history of other venous thrombosis and embolism: Secondary | ICD-10-CM | POA: Diagnosis not present

## 2023-06-05 DIAGNOSIS — Z7901 Long term (current) use of anticoagulants: Secondary | ICD-10-CM | POA: Insufficient documentation

## 2023-06-05 DIAGNOSIS — Z9484 Stem cells transplant status: Secondary | ICD-10-CM | POA: Insufficient documentation

## 2023-06-05 LAB — LACTATE DEHYDROGENASE: LDH: 236 U/L — ABNORMAL HIGH (ref 98–192)

## 2023-06-05 LAB — CMP (CANCER CENTER ONLY)
ALT: 45 U/L — ABNORMAL HIGH (ref 0–44)
AST: 43 U/L — ABNORMAL HIGH (ref 15–41)
Albumin: 4.8 g/dL (ref 3.5–5.0)
Alkaline Phosphatase: 48 U/L (ref 38–126)
Anion gap: 9 (ref 5–15)
BUN: 11 mg/dL (ref 8–23)
CO2: 28 mmol/L (ref 22–32)
Calcium: 10.1 mg/dL (ref 8.9–10.3)
Chloride: 102 mmol/L (ref 98–111)
Creatinine: 0.98 mg/dL (ref 0.61–1.24)
GFR, Estimated: >60 mL/min (ref >60)
Glucose, Bld: 90 mg/dL (ref 70–99)
Potassium: 4.1 mmol/L (ref 3.5–5.1)
Sodium: 139 mmol/L (ref 135–145)
Total Bilirubin: 0.8 mg/dL (ref <1.2)
Total Protein: 8 g/dL (ref 6.5–8.1)

## 2023-06-05 LAB — CBC WITH DIFFERENTIAL (CANCER CENTER ONLY)
Abs Immature Granulocytes: 0.01 10*3/uL (ref 0.00–0.07)
Basophils Absolute: 0 10*3/uL (ref 0.0–0.1)
Basophils Relative: 1 %
Eosinophils Absolute: 0.1 10*3/uL (ref 0.0–0.5)
Eosinophils Relative: 3 %
HCT: 46.9 % (ref 39.0–52.0)
Hemoglobin: 16.5 g/dL (ref 13.0–17.0)
Immature Granulocytes: 0 %
Lymphocytes Relative: 53 %
Lymphs Abs: 2 10*3/uL (ref 0.7–4.0)
MCH: 36.1 pg — ABNORMAL HIGH (ref 26.0–34.0)
MCHC: 35.2 g/dL (ref 30.0–36.0)
MCV: 102.6 fL — ABNORMAL HIGH (ref 80.0–100.0)
Monocytes Absolute: 0.5 10*3/uL (ref 0.1–1.0)
Monocytes Relative: 12 %
Neutro Abs: 1.2 10*3/uL — ABNORMAL LOW (ref 1.7–7.7)
Neutrophils Relative %: 31 %
Platelet Count: 142 10*3/uL — ABNORMAL LOW (ref 150–400)
RBC: 4.57 MIL/uL (ref 4.22–5.81)
RDW: 13.3 % (ref 11.5–15.5)
WBC Count: 3.9 10*3/uL — ABNORMAL LOW (ref 4.0–10.5)
nRBC: 0 % (ref 0.0–0.2)

## 2023-06-05 LAB — IRON AND IRON BINDING CAPACITY (CC-WL,HP ONLY)
Iron: 98 ug/dL (ref 45–182)
Saturation Ratios: 27 % (ref 17.9–39.5)
TIBC: 370 ug/dL (ref 250–450)
UIBC: 272 ug/dL (ref 117–376)

## 2023-06-05 LAB — FERRITIN: Ferritin: 136 ng/mL (ref 24–336)

## 2023-06-05 MED ORDER — ZOLEDRONIC ACID 4 MG/5ML IV CONC
4.0000 mg | Freq: Once | INTRAVENOUS | Status: AC
  Administered 2023-06-05: 4 mg via INTRAVENOUS
  Filled 2023-06-05: qty 5

## 2023-06-05 NOTE — Patient Instructions (Signed)

## 2023-06-05 NOTE — Telephone Encounter (Signed)
Advised via MyChart.

## 2023-06-05 NOTE — Telephone Encounter (Signed)
-----   Message from Josph Macho sent at 06/05/2023  2:25 PM EST ----- Please call and let him know that the iron level is okay.  Thanks.  Cindee Lame

## 2023-06-05 NOTE — Progress Notes (Signed)
Hematology and Oncology Follow Up Visit  Jorge [name] [id number] 05-28-1956 67 y.o. 06/05/2023   Principle Diagnosis:  IgG Kappa myeloma - Hyperdiploid/+11 DVT of the LEFT and RIGHT leg  Iron deficiency anemia  Current Therapy:   Revlimid 5mg  po q day (21 on/7 off) - start on 04/07/2018   Zometa 4 mg IV q 3 months - next dose is 08/2023  Xarelto 10 mg PO daily IV Iron as needed  -Feraheme given on 11/29/2018   Interim History:  [name] is here today for follow-up.  He is doing quite nicely.  I think we last saw him about 3 months ago.  As always, he has family are traveling.  Next year, it sounds like they will have a incredible travel schedule.  He just got back from [place], [place].  He had a wonderful time down there.[name]  He has been feeling well.  He has had no problems with infections.  He has had no issues with COVID.  He has had no change in bowel or bladder habits.  He has had no cough or shortness of breath.  He has had no rashes.  There has been no bleeding.  He has had no leg swelling.  When we last checked his myeloma studies, there was no monoclonal spike in his blood.  His IgG level was 1033 mg/dL.  The Kappa light chain was 3.5 mg/dL.  He is doing well with the Xarelto.  He is on low-dose maintenance Xarelto.  He is also doing well on the Revlimid.  He is on low-dose Revlimid.  He is staying quite active.  He is exercising.  Overall, I would have to say that his performance status is probably ECOG 1.     Medications:  Allergies as of 06/05/2023       Reactions   Crestor [rosuvastatin Calcium] Other (See Comments)   Achilles tendonitis   Singulair [montelukast] Other (See Comments)   Mood change        Medication List        Accurate as of June 05, 2023 10:19 AM. If you have any questions, ask your nurse or doctor.          acetaminophen 325 MG tablet Commonly known as: TYLENOL Take 2 tablets (650 mg total) by mouth every 6 (six) hours as  needed for mild pain (or Fever >/= 101).   allopurinol 100 MG tablet Commonly known as: ZYLOPRIM TAKE 1 TABLET BY MOUTH EVERY DAY   amLODipine 5 MG tablet Commonly known as: NORVASC TAKE 1 TABLET (5 MG TOTAL) BY MOUTH DAILY.   budesonide-formoterol 160-4.5 MCG/ACT inhaler Commonly known as: Symbicort TAKE 2 PUFFS BY MOUTH TWICE A DAY   colchicine 0.6 MG tablet Take 1 tablet (0.6 mg total) by mouth 2 (two) times daily as needed (when having a gout flare).   FISH OIL HIGH POTENCY PO Take by mouth daily.   fluorouracil 5 % cream Commonly known as: EFUDEX Apply 1 Application topically 2 (two) times daily as needed (For rash on head).   gabapentin 300 MG capsule Commonly known as: NEURONTIN Take 1 capsule (300 mg total) by mouth 2 (two) times daily.   ipratropium 0.03 % nasal spray Commonly known as: ATROVENT PLACE 1-2 SPRAYS INTO BOTH NOSTRILS 2 (TWO) TIMES DAILY AS NEEDED (NASAL DRAINAGE).   lenalidomide 5 MG capsule Commonly known as: REVLIMID TAKE 1 CAPSULE BY MOUTH DAILY  FOR 21 DAYS, THEN 7 DAYS OFF   loperamide 2 MG capsule Commonly  known as: IMODIUM Take 2 mg by mouth as needed for diarrhea or loose stools.   valsartan 160 MG tablet Commonly known as: DIOVAN Take 1 tablet (160 mg total) by mouth daily.   Vitamin D3 250 MCG (10000 UT) capsule Take 10,000 Units by mouth daily.   Xarelto 10 MG Tabs tablet Generic drug: rivaroxaban TAKE 1 TABLET BY MOUTH EVERY DAY   zolpidem 5 MG tablet Commonly known as: AMBIEN TAKE 1 TABLET BY MOUTH EVERY DAY AT BEDTIME AS NEEDED FOR SLEEP   ZOMETA IV Inject into the vein every 3 (three) months.        Allergies:  Allergies  Allergen Reactions   Crestor [Rosuvastatin Calcium] Other (See Comments)    Achilles tendonitis   Singulair [Montelukast] Other (See Comments)    Mood change    Past Medical History, Surgical history, Social history, and Family History were reviewed and updated.  Review of Systems: Review  of Systems  Constitutional: Negative.   HENT: Negative.   Eyes: Negative.   Respiratory: Negative.   Cardiovascular: Negative.   Gastrointestinal: Negative.   Genitourinary: Negative.   Musculoskeletal: Positive for myalgias.  Skin: Negative.   Neurological: Negative.   Endo/Heme/Allergies: Negative.   Psychiatric/Behavioral: Negative.      Physical Exam:  height is 5\' 7"  (1.702 m) and weight is 183 lb 12.8 oz (83.4 kg). His oral temperature is 98.2 F (36.8 C). His blood pressure is 118/70 and his pulse is 62. His respiration is 18 and oxygen saturation is 100%.   Wt Readings from Last 3 Encounters:  06/05/23 183 lb 12.8 oz (83.4 kg)  04/07/23 182 lb 6.4 oz (82.7 kg)  03/06/23 181 lb (82.1 kg)    Physical Exam Vitals signs reviewed.  HENT:     Head: Normocephalic and atraumatic.  Eyes:     Pupils: Pupils are equal, round, and reactive to light.  Neck:     Musculoskeletal: Normal range of motion.  Cardiovascular:     Rate and Rhythm: Normal rate and regular rhythm.     Heart sounds: Normal heart sounds.  Pulmonary:     Effort: Pulmonary effort is normal.     Breath sounds: Normal breath sounds.  Abdominal:     General: Bowel sounds are normal.     Palpations: Abdomen is soft.  Musculoskeletal: Normal range of motion.        General: No tenderness or deformity.  Lymphadenopathy:     Cervical: No cervical adenopathy.  Skin:    General: Skin is warm and dry.     Findings: No erythema or rash.  Neurological:     Mental Status: He is alert and oriented to person, place, and time.  Psychiatric:        Behavior: Behavior normal.        Thought Content: Thought content normal.        Judgment: Judgment normal.      Lab Results  Component Value Date   WBC 3.9 (L) 06/05/2023   HGB 16.5 06/05/2023   HCT 46.9 06/05/2023   MCV 102.6 (H) 06/05/2023   PLT 142 (L) 06/05/2023   Lab Results  Component Value Date   FERRITIN 105 03/06/2023   IRON 116 03/06/2023    TIBC 347 03/06/2023   UIBC 231 03/06/2023   IRONPCTSAT 33 03/06/2023   Lab Results  Component Value Date   RETICCTPCT 1.8 08/22/2022   RBC 4.57 06/05/2023   Lab Results  Component Value Date   KPAFRELGTCHN  34.9 (H) 03/06/2023   LAMBDASER 25.6 03/06/2023   KAPLAMBRATIO 1.36 03/06/2023   Lab Results  Component Value Date   IGGSERUM 1,033 03/06/2023   IGA 422 03/06/2023   IGMSERUM 29 03/06/2023   Lab Results  Component Value Date   TOTALPROTELP 6.6 12/04/2022   ALBUMINELP 3.9 12/04/2022   A1GS 0.2 12/04/2022   A2GS 0.5 12/04/2022   BETS 1.0 12/04/2022   GAMS 1.0 12/04/2022   MSPIKE Not Observed 12/04/2022   SPEI Comment 01/05/2018     Chemistry      Component Value Date/Time   NA 139 06/05/2023 0911   NA 143 06/30/2017 1049   NA 140 10/03/2016 0845   K 4.1 06/05/2023 0911   K 4.0 06/30/2017 1049   K 4.3 10/03/2016 0845   CL 102 06/05/2023 0911   CL 105 06/30/2017 1049   CO2 28 06/05/2023 0911   CO2 27 06/30/2017 1049   CO2 26 10/03/2016 0845   BUN 11 06/05/2023 0911   BUN 13 06/30/2017 1049   BUN 11.9 10/03/2016 0845   CREATININE 0.98 06/05/2023 0911   CREATININE 1.0 06/30/2017 1049   CREATININE 0.9 10/03/2016 0845      Component Value Date/Time   CALCIUM 10.1 06/05/2023 0911   CALCIUM 9.0 06/30/2017 1049   CALCIUM 9.9 10/03/2016 0845   ALKPHOS 48 06/05/2023 0911   ALKPHOS 57 06/30/2017 1049   ALKPHOS 80 10/03/2016 0845   AST 43 (H) 06/05/2023 0911   AST 28 10/03/2016 0845   ALT 45 (H) 06/05/2023 0911   ALT 43 06/30/2017 1049   ALT 26 10/03/2016 0845   BILITOT 0.8 06/05/2023 0911   BILITOT 0.48 10/03/2016 0845      Impression and Plan: Mr. Bapst is a very nice 67 yo gentleman with IgG kappa myeloma.  He ultimately underwent an autologous stem cell transplant at [facility] in February 2018.  He currently is on maintenance Revlimid.  Everything looks fantastic.  He gets his Zometa today.  I would have to think that his iron levels should be okay  also.  I am so happy that he has had a great quality of life.  He is now retired.  He is really enjoying being with his family and traveling.  As always, we will get him back in 3 months.  Josph Macho, MD 11/22/202410:19 AM

## 2023-06-07 LAB — KAPPA/LAMBDA LIGHT CHAINS
Kappa free light chain: 33.9 mg/L — ABNORMAL HIGH (ref 3.3–19.4)
Kappa, lambda light chain ratio: 1.36 (ref 0.26–1.65)
Lambda free light chains: 25 mg/L (ref 5.7–26.3)

## 2023-06-07 LAB — IGG, IGA, IGM
IgA: 445 mg/dL — ABNORMAL HIGH (ref 61–437)
IgG (Immunoglobin G), Serum: 1138 mg/dL (ref 603–1613)
IgM (Immunoglobulin M), Srm: 38 mg/dL (ref 20–172)

## 2023-06-09 ENCOUNTER — Telehealth: Payer: Self-pay

## 2023-06-09 DIAGNOSIS — C9000 Multiple myeloma not having achieved remission: Secondary | ICD-10-CM

## 2023-06-09 MED ORDER — LENALIDOMIDE 5 MG PO CAPS: ORAL_CAPSULE | ORAL | 0 refills | Status: DC

## 2023-06-09 NOTE — Telephone Encounter (Signed)
Auth number for revlimid competed and in rx

## 2023-06-14 ENCOUNTER — Other Ambulatory Visit: Payer: Self-pay | Admitting: Medical

## 2023-06-15 LAB — PROTEIN ELECTROPHORESIS, SERUM, WITH REFLEX
A/G Ratio: 1 (ref 0.7–1.7)
Albumin ELP: 3.8 g/dL (ref 2.9–4.4)
Alpha-1-Globulin: 0.2 g/dL (ref 0.0–0.4)
Alpha-2-Globulin: 0.7 g/dL (ref 0.4–1.0)
Beta Globulin: 1.6 g/dL — ABNORMAL HIGH (ref 0.7–1.3)
Gamma Globulin: 1.2 g/dL (ref 0.4–1.8)
Globulin, Total: 3.7 g/dL (ref 2.2–3.9)
SPEP Interpretation: 0
Total Protein ELP: 7.5 g/dL (ref 6.0–8.5)

## 2023-06-15 LAB — IMMUNOFIXATION REFLEX, SERUM
IgA: 472 mg/dL — ABNORMAL HIGH (ref 61–437)
IgG (Immunoglobin G), Serum: 1182 mg/dL (ref 603–1613)
IgM (Immunoglobulin M), Srm: 38 mg/dL (ref 20–172)

## 2023-06-16 ENCOUNTER — Encounter: Payer: Self-pay | Admitting: Hematology & Oncology

## 2023-06-17 ENCOUNTER — Encounter: Payer: Self-pay | Admitting: *Deleted

## 2023-06-18 ENCOUNTER — Other Ambulatory Visit: Payer: Self-pay | Admitting: Emergency Medicine

## 2023-07-09 ENCOUNTER — Other Ambulatory Visit: Payer: Self-pay | Admitting: *Deleted

## 2023-07-09 ENCOUNTER — Other Ambulatory Visit: Payer: Self-pay | Admitting: Hematology & Oncology

## 2023-07-09 DIAGNOSIS — C9000 Multiple myeloma not having achieved remission: Secondary | ICD-10-CM

## 2023-07-09 MED ORDER — LENALIDOMIDE 5 MG PO CAPS: ORAL_CAPSULE | ORAL | 0 refills | Status: DC

## 2023-07-31 ENCOUNTER — Telehealth: Payer: Self-pay

## 2023-07-31 MED ORDER — COLCHICINE 0.6 MG PO TABS: 0.6000 mg | ORAL_TABLET | Freq: Two times a day (BID) | ORAL | 0 refills | Status: AC | PRN

## 2023-07-31 NOTE — Telephone Encounter (Signed)
Rx colchicine refill sent to pharmacy.

## 2023-07-31 NOTE — Telephone Encounter (Signed)
Copied from CRM [id number]. Topic: Clinical - Medication Refill >> Jul 31, 2023  9:52 AM Aletta Edouard wrote: Most Recent Primary Care Visit:  Provider: Esperanza Richters  Department: [facility]  Visit Type: OFFICE VISIT  Date: 04/07/2023  Medication:  colchicine    mg .6   Has the patient contacted their pharmacy? Yes (Agent: If no, request that the patient contact the pharmacy for the refill. If patient does not wish to contact the pharmacy document the reason why and proceed with request.) (Agent: If yes, when and what did the pharmacy advise?)  Is this the correct pharmacy for this prescription? Yes If no, delete pharmacy and type the correct one.  This is the patient's preferred pharmacy:  [id number] [place], [place] - [address] [place] [address] Phone: [phone] Fax: [phone]    Has the prescription been filled recently? No  Is the patient out of the medication? Yes  Has the patient been seen for an appointment in the last year OR does the patient have an upcoming appointment? Yes  Can we respond through MyChart? No  Agent: Please be advised that Rx refills may take up to 3 business days. We ask that you follow-up with your pharmacy.

## 2023-07-31 NOTE — Addendum Note (Signed)
Addended by: Gwenevere Abbot on: 07/31/2023 08:34 PM   Modules accepted: Orders

## 2023-08-01 ENCOUNTER — Other Ambulatory Visit: Payer: Self-pay | Admitting: Medical

## 2023-08-01 DIAGNOSIS — L03032 Cellulitis of left toe: Secondary | ICD-10-CM

## 2023-08-05 ENCOUNTER — Telehealth: Payer: Self-pay | Admitting: Medical

## 2023-08-05 NOTE — Telephone Encounter (Signed)
Copied from CRM [id number]. Topic: Medicare AWV >> Aug 05, 2023  1:23 PM Payton Doughty wrote: Reason for CRM: Called LVM 08/05/2023 to schedule AWV. Please schedule Virtual or Telehealth visits ONLY.   Verlee Rossetti; Care Guide Ambulatory Clinical Support [place] [facility] Direct Dial: [phone]

## 2023-08-08 ENCOUNTER — Other Ambulatory Visit: Payer: Self-pay | Admitting: Hematology & Oncology

## 2023-08-08 DIAGNOSIS — C9000 Multiple myeloma not having achieved remission: Secondary | ICD-10-CM

## 2023-08-11 ENCOUNTER — Other Ambulatory Visit: Payer: Self-pay | Admitting: *Deleted

## 2023-08-11 DIAGNOSIS — C9000 Multiple myeloma not having achieved remission: Secondary | ICD-10-CM

## 2023-08-11 MED ORDER — LENALIDOMIDE 5 MG PO CAPS: ORAL_CAPSULE | ORAL | 0 refills | Status: DC

## 2023-08-28 ENCOUNTER — Encounter: Payer: Self-pay | Admitting: Hematology & Oncology

## 2023-08-28 ENCOUNTER — Inpatient Hospital Stay (HOSPITAL_BASED_OUTPATIENT_CLINIC_OR_DEPARTMENT_OTHER): Payer: Medicare Other | Admitting: Hematology & Oncology

## 2023-08-28 ENCOUNTER — Inpatient Hospital Stay: Payer: Medicare Other

## 2023-08-28 ENCOUNTER — Other Ambulatory Visit: Payer: Self-pay | Admitting: Allergy

## 2023-08-28 ENCOUNTER — Inpatient Hospital Stay: Payer: Medicare Other | Attending: Hematology & Oncology

## 2023-08-28 VITALS — BP 117/73 | HR 60 | Resp 20

## 2023-08-28 VITALS — BP 102/66 | HR 57 | Temp 98.7°F | Resp 18 | Ht 67.0 in | Wt 183.1 lb

## 2023-08-28 DIAGNOSIS — Z86718 Personal history of other venous thrombosis and embolism: Secondary | ICD-10-CM | POA: Diagnosis not present

## 2023-08-28 DIAGNOSIS — Z79899 Other long term (current) drug therapy: Secondary | ICD-10-CM | POA: Diagnosis not present

## 2023-08-28 DIAGNOSIS — Z7951 Long term (current) use of inhaled steroids: Secondary | ICD-10-CM | POA: Insufficient documentation

## 2023-08-28 DIAGNOSIS — C9001 Multiple myeloma in remission: Secondary | ICD-10-CM | POA: Diagnosis not present

## 2023-08-28 DIAGNOSIS — D509 Iron deficiency anemia, unspecified: Secondary | ICD-10-CM | POA: Diagnosis not present

## 2023-08-28 DIAGNOSIS — Z7901 Long term (current) use of anticoagulants: Secondary | ICD-10-CM | POA: Insufficient documentation

## 2023-08-28 DIAGNOSIS — C9000 Multiple myeloma not having achieved remission: Secondary | ICD-10-CM

## 2023-08-28 DIAGNOSIS — Z9484 Stem cells transplant status: Secondary | ICD-10-CM | POA: Insufficient documentation

## 2023-08-28 LAB — CMP (CANCER CENTER ONLY)
ALT: 29 U/L (ref 0–44)
AST: 30 U/L (ref 15–41)
Albumin: 4.2 g/dL (ref 3.5–5.0)
Alkaline Phosphatase: 42 U/L (ref 38–126)
Anion gap: 8 (ref 5–15)
BUN: 14 mg/dL (ref 8–23)
CO2: 25 mmol/L (ref 22–32)
Calcium: 9.5 mg/dL (ref 8.9–10.3)
Chloride: 104 mmol/L (ref 98–111)
Creatinine: 0.93 mg/dL (ref 0.61–1.24)
GFR, Estimated: >60 mL/min (ref >60)
Glucose, Bld: 93 mg/dL (ref 70–99)
Potassium: 4 mmol/L (ref 3.5–5.1)
Sodium: 137 mmol/L (ref 135–145)
Total Bilirubin: 1 mg/dL (ref 0.0–1.2)
Total Protein: 6.7 g/dL (ref 6.5–8.1)

## 2023-08-28 LAB — IRON AND IRON BINDING CAPACITY (CC-WL,HP ONLY)
Iron: 110 ug/dL (ref 45–182)
Saturation Ratios: 30 % (ref 17.9–39.5)
TIBC: 370 ug/dL (ref 250–450)
UIBC: 260 ug/dL (ref 117–376)

## 2023-08-28 LAB — CBC WITH DIFFERENTIAL (CANCER CENTER ONLY)
Abs Immature Granulocytes: 0.01 10*3/uL (ref 0.00–0.07)
Basophils Absolute: 0 10*3/uL (ref 0.0–0.1)
Basophils Relative: 1 %
Eosinophils Absolute: 0.1 10*3/uL (ref 0.0–0.5)
Eosinophils Relative: 3 %
HCT: 43.5 % (ref 39.0–52.0)
Hemoglobin: 15.4 g/dL (ref 13.0–17.0)
Immature Granulocytes: 0 %
Lymphocytes Relative: 63 %
Lymphs Abs: 2.4 10*3/uL (ref 0.7–4.0)
MCH: 35.9 pg — ABNORMAL HIGH (ref 26.0–34.0)
MCHC: 35.4 g/dL (ref 30.0–36.0)
MCV: 101.4 fL — ABNORMAL HIGH (ref 80.0–100.0)
Monocytes Absolute: 0.5 10*3/uL (ref 0.1–1.0)
Monocytes Relative: 13 %
Neutro Abs: 0.8 10*3/uL — ABNORMAL LOW (ref 1.7–7.7)
Neutrophils Relative %: 20 %
Platelet Count: 114 10*3/uL — ABNORMAL LOW (ref 150–400)
RBC: 4.29 MIL/uL (ref 4.22–5.81)
RDW: 13.4 % (ref 11.5–15.5)
WBC Count: 3.8 10*3/uL — ABNORMAL LOW (ref 4.0–10.5)
nRBC: 0 % (ref 0.0–0.2)

## 2023-08-28 LAB — FERRITIN: Ferritin: 105 ng/mL (ref 24–336)

## 2023-08-28 LAB — LACTATE DEHYDROGENASE: LDH: 148 U/L (ref 98–192)

## 2023-08-28 MED ORDER — ZOLEDRONIC ACID 4 MG/100ML IV SOLN
4.0000 mg | Freq: Once | INTRAVENOUS | Status: AC
  Administered 2023-08-28: 4 mg via INTRAVENOUS
  Filled 2023-08-28: qty 100

## 2023-08-28 MED ORDER — SODIUM CHLORIDE 0.9 % IV SOLN: INTRAVENOUS | Status: DC

## 2023-08-28 MED ORDER — ZOLEDRONIC ACID 4 MG/5ML IV CONC
4.0000 mg | Freq: Once | INTRAVENOUS | Status: DC
  Filled 2023-08-28: qty 5
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
## 2023-08-28 NOTE — Patient Instructions (Signed)
[facility] - A DEPT OF [facility]  Discharge Instructions: Thank you for choosing [facility] to provide your oncology and hematology care.   If you have a lab appointment with the Cancer Center, please go directly to the Cancer Center and check in at the registration area.  Wear comfortable clothing and clothing appropriate for easy access to any Portacath or PICC line.   We strive to give you quality time with your provider. You may need to reschedule your appointment if you arrive late (15 or more minutes).  Arriving late affects you and other patients whose appointments are after yours.  Also, if you miss three or more appointments without notifying the office, you may be dismissed from the clinic at the provider's discretion.      For prescription refill requests, have your pharmacy contact our office and allow 72 hours for refills to be completed.    Today you received the following agents Zometa      To help prevent nausea and vomiting after your treatment, we encourage you to take your nausea medication as directed.  BELOW ARE SYMPTOMS THAT SHOULD BE REPORTED IMMEDIATELY: *FEVER GREATER THAN 100.4 F (38 C) OR HIGHER *CHILLS OR SWEATING *NAUSEA AND VOMITING THAT IS NOT CONTROLLED WITH YOUR NAUSEA MEDICATION *UNUSUAL SHORTNESS OF BREATH *UNUSUAL BRUISING OR BLEEDING *URINARY PROBLEMS (pain or burning when urinating, or frequent urination) *BOWEL PROBLEMS (unusual diarrhea, constipation, pain near the anus) TENDERNESS IN MOUTH AND THROAT WITH OR WITHOUT PRESENCE OF ULCERS (sore throat, sores in mouth, or a toothache) UNUSUAL RASH, SWELLING OR PAIN  UNUSUAL VAGINAL DISCHARGE OR ITCHING   Items with * indicate a potential emergency and should be followed up as soon as possible or go to the Emergency Department if any problems should occur.  Please show the CHEMOTHERAPY ALERT CARD or IMMUNOTHERAPY ALERT CARD at check-in to the Emergency  Department and triage nurse. Should you have questions after your visit or need to cancel or reschedule your appointment, please contact [facility] - A DEPT OF [facility]igh[facility]  [phone] and follow the prompts.  Office hours are 8:00 a.m. to 4:30 p.m. Monday - Friday. Please note that voicemails left after 4:00 p.m. may not be returned until the following business day.  We are closed weekends and major holidays. You have access to a nurse at all times for urgent questions. Please call the main number to the clinic [phone] and follow the prompts.  For any non-urgent questions, you may also contact your provider using MyChart. We now offer e-Visits for anyone 19 and older to request care online for non-urgent symptoms. For details visit [email or web address].   Also download the MyChart app! Go to the app store, search "MyChart", open the app, select [facility], and log in with your MyChart username and password.

## 2023-08-30 LAB — IGG, IGA, IGM
IgA: 442 mg/dL — ABNORMAL HIGH (ref 61–437)
IgG (Immunoglobin G), Serum: 1108 mg/dL (ref 603–1613)
IgM (Immunoglobulin M), Srm: 33 mg/dL (ref 20–172)

## 2023-08-31 LAB — PROTEIN ELECTROPHORESIS, SERUM, WITH REFLEX
A/G Ratio: 1.4 (ref 0.7–1.7)
Albumin ELP: 3.9 g/dL (ref 2.9–4.4)
Alpha-1-Globulin: 0.2 g/dL (ref 0.0–0.4)
Alpha-2-Globulin: 0.5 g/dL (ref 0.4–1.0)
Beta Globulin: 1 g/dL (ref 0.7–1.3)
Gamma Globulin: 1.1 g/dL (ref 0.4–1.8)
Globulin, Total: 2.8 g/dL (ref 2.2–3.9)
Total Protein ELP: 6.7 g/dL (ref 6.0–8.5)

## 2023-08-31 LAB — KAPPA/LAMBDA LIGHT CHAINS
Kappa free light chain: 34.3 mg/L — ABNORMAL HIGH (ref 3.3–19.4)
Kappa, lambda light chain ratio: 1.83 — ABNORMAL HIGH (ref 0.26–1.65)
Lambda free light chains: 18.7 mg/L (ref 5.7–26.3)

## 2023-09-04 ENCOUNTER — Inpatient Hospital Stay: Payer: Medicare Other

## 2023-09-04 ENCOUNTER — Ambulatory Visit: Payer: Medicare Other

## 2023-09-04 ENCOUNTER — Ambulatory Visit: Payer: Medicare Other | Admitting: Hematology & Oncology

## 2023-09-07 ENCOUNTER — Telehealth: Payer: Self-pay | Admitting: Neurology
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
## 2023-09-10 ENCOUNTER — Telehealth: Payer: Self-pay

## 2023-09-10 ENCOUNTER — Encounter: Payer: Self-pay | Admitting: Medical
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
## 2023-09-10 NOTE — Telephone Encounter (Signed)
 Per report in Epic for ED visit on date of 09/08/2023 -- pt received Cipro & Bactrim .[name]  Pt has an appointment on 09/14/23

## 2023-09-10 NOTE — Telephone Encounter (Signed)
 Pt called , stated still in ED , will upload pictures of wound and will be in Monday for his appointment since he is still out of town until Sunday

## 2023-09-11 ENCOUNTER — Other Ambulatory Visit: Payer: Self-pay | Admitting: Medical

## 2023-09-11 ENCOUNTER — Other Ambulatory Visit: Payer: Self-pay | Admitting: *Deleted

## 2023-09-11 ENCOUNTER — Other Ambulatory Visit: Payer: Self-pay | Admitting: Hematology & Oncology

## 2023-09-11 DIAGNOSIS — C9000 Multiple myeloma not having achieved remission: Secondary | ICD-10-CM

## 2023-09-11 DIAGNOSIS — L03032 Cellulitis of left toe: Secondary | ICD-10-CM

## 2023-09-11 MED ORDER — LENALIDOMIDE 5 MG PO CAPS: ORAL_CAPSULE | ORAL | 0 refills | Status: DC

## 2023-09-13 ENCOUNTER — Emergency Department (HOSPITAL_BASED_OUTPATIENT_CLINIC_OR_DEPARTMENT_OTHER)

## 2023-09-13 ENCOUNTER — Other Ambulatory Visit: Payer: Self-pay

## 2023-09-13 ENCOUNTER — Inpatient Hospital Stay (HOSPITAL_BASED_OUTPATIENT_CLINIC_OR_DEPARTMENT_OTHER)
Admission: EM | Admit: 2023-09-13 | Discharge: 2023-09-16 | DRG: 603 | Disposition: A | Discharge status: Home Health | Attending: Internal Medicine | Admitting: Internal Medicine

## 2023-09-13 ENCOUNTER — Encounter (HOSPITAL_BASED_OUTPATIENT_CLINIC_OR_DEPARTMENT_OTHER): Payer: Self-pay | Admitting: Emergency Medicine

## 2023-09-13 ENCOUNTER — Encounter: Payer: Self-pay | Admitting: Hematology & Oncology

## 2023-09-13 DIAGNOSIS — B348 Other viral infections of unspecified site: Secondary | ICD-10-CM

## 2023-09-13 DIAGNOSIS — S81812D Laceration without foreign body, left lower leg, subsequent encounter: Secondary | ICD-10-CM

## 2023-09-13 DIAGNOSIS — Z8041 Family history of malignant neoplasm of ovary: Secondary | ICD-10-CM

## 2023-09-13 DIAGNOSIS — I10 Essential (primary) hypertension: Secondary | ICD-10-CM | POA: Diagnosis present

## 2023-09-13 DIAGNOSIS — Z818 Family history of other mental and behavioral disorders: Secondary | ICD-10-CM | POA: Diagnosis not present

## 2023-09-13 DIAGNOSIS — D539 Nutritional anemia, unspecified: Secondary | ICD-10-CM | POA: Diagnosis present

## 2023-09-13 DIAGNOSIS — S81802A Unspecified open wound, left lower leg, initial encounter: Secondary | ICD-10-CM | POA: Diagnosis not present

## 2023-09-13 DIAGNOSIS — Z9221 Personal history of antineoplastic chemotherapy: Secondary | ICD-10-CM

## 2023-09-13 DIAGNOSIS — Z820 Family history of epilepsy and other diseases of the nervous system: Secondary | ICD-10-CM

## 2023-09-13 DIAGNOSIS — Z7951 Long term (current) use of inhaled steroids: Secondary | ICD-10-CM | POA: Diagnosis not present

## 2023-09-13 DIAGNOSIS — Z85830 Personal history of malignant neoplasm of bone: Secondary | ICD-10-CM | POA: Diagnosis not present

## 2023-09-13 DIAGNOSIS — Z9489 Other transplanted organ and tissue status: Secondary | ICD-10-CM

## 2023-09-13 DIAGNOSIS — Z806 Family history of leukemia: Secondary | ICD-10-CM

## 2023-09-13 DIAGNOSIS — Z8049 Family history of malignant neoplasm of other genital organs: Secondary | ICD-10-CM | POA: Diagnosis not present

## 2023-09-13 DIAGNOSIS — Z888 Allergy status to other drugs, medicaments and biological substances status: Secondary | ICD-10-CM

## 2023-09-13 DIAGNOSIS — L299 Pruritus, unspecified: Secondary | ICD-10-CM | POA: Diagnosis present

## 2023-09-13 DIAGNOSIS — L03119 Cellulitis of unspecified part of limb: Secondary | ICD-10-CM | POA: Diagnosis present

## 2023-09-13 DIAGNOSIS — Z923 Personal history of irradiation: Secondary | ICD-10-CM

## 2023-09-13 DIAGNOSIS — Z87820 Personal history of traumatic brain injury: Secondary | ICD-10-CM | POA: Diagnosis not present

## 2023-09-13 DIAGNOSIS — Z823 Family history of stroke: Secondary | ICD-10-CM | POA: Diagnosis not present

## 2023-09-13 DIAGNOSIS — C9001 Multiple myeloma in remission: Secondary | ICD-10-CM | POA: Diagnosis present

## 2023-09-13 DIAGNOSIS — X58XXXD Exposure to other specified factors, subsequent encounter: Secondary | ICD-10-CM | POA: Diagnosis present

## 2023-09-13 DIAGNOSIS — Z8249 Family history of ischemic heart disease and other diseases of the circulatory system: Secondary | ICD-10-CM

## 2023-09-13 DIAGNOSIS — Z813 Family history of other psychoactive substance abuse and dependence: Secondary | ICD-10-CM | POA: Diagnosis not present

## 2023-09-13 DIAGNOSIS — L03116 Cellulitis of left lower limb: Principal | ICD-10-CM | POA: Diagnosis present

## 2023-09-13 DIAGNOSIS — Z800 Family history of malignant neoplasm of digestive organs: Secondary | ICD-10-CM | POA: Diagnosis not present

## 2023-09-13 DIAGNOSIS — S81812A Laceration without foreign body, left lower leg, initial encounter: Secondary | ICD-10-CM | POA: Diagnosis not present

## 2023-09-13 DIAGNOSIS — Z87891 Personal history of nicotine dependence: Secondary | ICD-10-CM | POA: Diagnosis not present

## 2023-09-13 DIAGNOSIS — Z86718 Personal history of other venous thrombosis and embolism: Secondary | ICD-10-CM

## 2023-09-13 DIAGNOSIS — Z87442 Personal history of urinary calculi: Secondary | ICD-10-CM

## 2023-09-13 DIAGNOSIS — Z79899 Other long term (current) drug therapy: Secondary | ICD-10-CM

## 2023-09-13 DIAGNOSIS — Z7901 Long term (current) use of anticoagulants: Secondary | ICD-10-CM | POA: Diagnosis not present

## 2023-09-13 DIAGNOSIS — Z77111 Contact with and (suspected) exposure to water pollution: Secondary | ICD-10-CM | POA: Diagnosis not present

## 2023-09-13 LAB — CBC WITH DIFFERENTIAL/PLATELET
Abs Immature Granulocytes: 0.03 10*3/uL (ref 0.00–0.07)
Basophils Absolute: 0 10*3/uL (ref 0.0–0.1)
Basophils Relative: 1 %
Eosinophils Absolute: 0.1 10*3/uL (ref 0.0–0.5)
Eosinophils Relative: 1 %
HCT: 40.5 % (ref 39.0–52.0)
Hemoglobin: 13.7 g/dL (ref 13.0–17.0)
Immature Granulocytes: 1 %
Lymphocytes Relative: 43 %
Lymphs Abs: 1.7 10*3/uL (ref 0.7–4.0)
MCH: 35 pg — ABNORMAL HIGH (ref 26.0–34.0)
MCHC: 33.8 g/dL (ref 30.0–36.0)
MCV: 103.6 fL — ABNORMAL HIGH (ref 80.0–100.0)
Monocytes Absolute: 0.4 10*3/uL (ref 0.1–1.0)
Monocytes Relative: 10 %
Neutro Abs: 1.7 10*3/uL (ref 1.7–7.7)
Neutrophils Relative %: 44 %
Platelets: 185 10*3/uL (ref 150–400)
RBC: 3.91 MIL/uL — ABNORMAL LOW (ref 4.22–5.81)
RDW: 14.1 % (ref 11.5–15.5)
WBC: 3.8 10*3/uL — ABNORMAL LOW (ref 4.0–10.5)
nRBC: 0 % (ref 0.0–0.2)

## 2023-09-13 LAB — BASIC METABOLIC PANEL
Anion gap: 10 (ref 5–15)
BUN: 14 mg/dL (ref 8–23)
CO2: 23 mmol/L (ref 22–32)
Calcium: 8.9 mg/dL (ref 8.9–10.3)
Chloride: 104 mmol/L (ref 98–111)
Creatinine, Ser: 1.13 mg/dL (ref 0.61–1.24)
GFR, Estimated: >60 mL/min (ref >60)
Glucose, Bld: 86 mg/dL (ref 70–99)
Potassium: 3.8 mmol/L (ref 3.5–5.1)
Sodium: 137 mmol/L (ref 135–145)

## 2023-09-13 LAB — HIV ANTIBODY (ROUTINE TESTING W REFLEX): HIV Screen 4th Generation wRfx: NONREACTIVE

## 2023-09-13 MED ORDER — ENOXAPARIN SODIUM 40 MG/0.4ML IJ SOSY: 40.0000 mg | PREFILLED_SYRINGE | Freq: Every day | INTRAMUSCULAR | Status: DC

## 2023-09-13 MED ORDER — ALLOPURINOL 100 MG PO TABS
100.0000 mg | ORAL_TABLET | Freq: Every day | ORAL | Status: DC
  Administered 2023-09-14 – 2023-09-16 (×3): 100 mg via ORAL
  Filled 2023-09-13 (×3): qty 1

## 2023-09-13 MED ORDER — LEVOFLOXACIN IN D5W 750 MG/150ML IV SOLN
750.0000 mg | Freq: Once | INTRAVENOUS | Status: AC
  Administered 2023-09-13: 750 mg via INTRAVENOUS
  Filled 2023-09-13: qty 150

## 2023-09-13 MED ORDER — ALBUTEROL SULFATE (2.5 MG/3ML) 0.083% IN NEBU: 2.5000 mg | INHALATION_SOLUTION | RESPIRATORY_TRACT | Status: DC | PRN

## 2023-09-13 MED ORDER — ONDANSETRON HCL 4 MG/2ML IJ SOLN: 4.0000 mg | Freq: Four times a day (QID) | INTRAMUSCULAR | Status: DC | PRN

## 2023-09-13 MED ORDER — ACETAMINOPHEN 650 MG RE SUPP: 650.0000 mg | Freq: Four times a day (QID) | RECTAL | Status: DC | PRN

## 2023-09-13 MED ORDER — AMLODIPINE BESYLATE 5 MG PO TABS
5.0000 mg | ORAL_TABLET | Freq: Every day | ORAL | Status: DC
Start: 2023-09-14 — End: 2023-09-16
  Administered 2023-09-14 – 2023-09-16 (×3): 5 mg via ORAL
  Filled 2023-09-13 (×3): qty 1

## 2023-09-13 MED ORDER — LEVOFLOXACIN IN D5W 500 MG/100ML IV SOLN
500.0000 mg | INTRAVENOUS | Status: DC
  Filled 2023-09-13: qty 100

## 2023-09-13 MED ORDER — SODIUM CHLORIDE 0.9 % IV SOLN
1.0000 g | Freq: Once | INTRAVENOUS | Status: AC
  Administered 2023-09-13: 1 g via INTRAVENOUS
  Filled 2023-09-13: qty 10

## 2023-09-13 MED ORDER — RIVAROXABAN 10 MG PO TABS
10.0000 mg | ORAL_TABLET | Freq: Every day | ORAL | Status: DC
  Administered 2023-09-13 – 2023-09-15 (×3): 10 mg via ORAL
  Filled 2023-09-13 (×3): qty 1

## 2023-09-13 MED ORDER — ONDANSETRON HCL 4 MG PO TABS
4.0000 mg | ORAL_TABLET | Freq: Four times a day (QID) | ORAL | Status: DC | PRN
Start: 2023-09-13 — End: 2023-09-16

## 2023-09-13 MED ORDER — GABAPENTIN 100 MG PO CAPS
300.0000 mg | ORAL_CAPSULE | Freq: Two times a day (BID) | ORAL | Status: DC
  Administered 2023-09-13 – 2023-09-16 (×6): 300 mg via ORAL
  Filled 2023-09-13 (×6): qty 3

## 2023-09-13 MED ORDER — SODIUM CHLORIDE 0.9 % IV SOLN
1.0000 g | INTRAVENOUS | Status: DC
  Filled 2023-09-13: qty 10

## 2023-09-13 MED ORDER — ACETAMINOPHEN 325 MG PO TABS: 650.0000 mg | ORAL_TABLET | Freq: Four times a day (QID) | ORAL | Status: DC | PRN

## 2023-09-13 MED ORDER — ZOLPIDEM TARTRATE 5 MG PO TABS: 5.0000 mg | ORAL_TABLET | Freq: Every evening | ORAL | Status: DC | PRN

## 2023-09-13 MED ORDER — SODIUM CHLORIDE 0.9 % IV SOLN: INTRAVENOUS | Status: AC | PRN

## 2023-09-13 MED ORDER — IRBESARTAN 150 MG PO TABS
150.0000 mg | ORAL_TABLET | Freq: Every day | ORAL | Status: DC
  Administered 2023-09-14 – 2023-09-16 (×3): 150 mg via ORAL
  Filled 2023-09-13 (×3): qty 1

## 2023-09-13 NOTE — ED Notes (Signed)
[facility] at bedside to assume care of patient. 

## 2023-09-13 NOTE — H&P (Addendum)
 History and Physical  Jorge [name] ZOX:[id number] DOB: 10-13-1955 DOA: 09/13/2023  PCP: Esperanza Richters, PA-C   Chief Complaint: leg infection   HPI: Jorge [name] is a 68 y.o. [name] with medical history significant for multiple myeloma in remission, hypertension being admitted to the hospital with left lower extremity cellulitis.  He has been on vacation in [place] and [place] lately, was getting off of a boat when he fell in the water and scratched his left leg on the way down.  This became infected, he was admitted to the hospital there and treated with IV Zosyn and daptomycin, had a CT scan at the time which did not show any abscess or evidence of necrotizing fasciitis.  After 3 days, he was discharged on ciprofloxacin and Bactrim.  About 24 hours after discharge from the hospital, he noticed some yellowish drainage.  He then decided to come back home to [place], noted and reviewed that he had a little bit more swelling, maybe a small area of erythema.  He has not had any fevers, or other systemic symptoms, however he came to the ER for evaluation today as he is concerned that overall the infection looks worse.  Evaluation in the ER as detailed below is relatively benign, however given the overall appearance of his infection and the fact that it is worsening on oral antibiotics, decision was made for hospital admission and management with IV antibiotics.  Review of Systems: Please see HPI for pertinent positives and negatives. A complete 10 system review of systems are otherwise negative.  Past Medical History:  Diagnosis Date   Allergy    Anxiety    Asthma    Blood transfusion without reported diagnosis 2018   Bone metastasis    Chest cold 05/19/2016   productive cough  -- started on antibiotic   Chronic back pain    due to bone mets from myeloma   Cough    Depression    Diverticulitis    GERD (gastroesophageal reflux disease)    Hiatal hernia    History of chicken pox     History of concussion    age 88 -- no residual   History of DVT of lower extremity 03/21/2016  treated and completed w/ xarelto   per doppler left extensive occlusion common femoral, femoral, and popliteal veins and right partial occlusion common femoral and profunda femoral veins/  last doppler 06-04-2016 no evidence acute or chronic dvt noted either leg    History of radiation therapy 06/09/16-06/23/16   lower thoracic spine 25 Gy in 10 fractions   Mouth ulcers    secondary to radiation   Multiple myeloma (HCC) dx 02/22/2016 via bone marrow bx---  oncologist-  dr Myna Hidalgo   IgG Kappa-- Hyperdiploid/ +11 w/ bone mets--  current treatment chemotherapy (started 08/ 2017)and pallitive radiation to back started 06-09-2016   Renal calculus, right    Wears contact lenses    Past Surgical History:  Procedure Laterality Date   COLONOSCOPY  2008,2013   COLONOSCOPY  01/07/2017   Dr Evette Cristal, [facility]   CYSTOSCOPY W/ URETERAL STENT PLACEMENT Right 06/20/2016   Procedure: CYSTOSCOPY WITH STENT REPLACEMENT;  Surgeon: Ihor Gully, MD;  Location: [facility];  Service: Urology;  Laterality: Right;   CYSTOSCOPY WITH RETROGRADE PYELOGRAM, URETEROSCOPY AND STENT PLACEMENT Right 05/30/2016   Procedure: CYSTOSCOPY WITH RETROGRADE PYELOGRAM, URETEROSCOPY AND STENT PLACEMENT,DILITATION URETERAL STRICTURE;  Surgeon: Ihor Gully, MD;  Location: [facility];  Service: Urology;  Laterality: Right;   CYSTOSCOPY/RETROGRADE/URETEROSCOPY/STONE EXTRACTION  WITH BASKET Right 06/20/2016   Procedure: CYSTOSCOPY/URETEROSCOPY/STONE EXTRACTION WITH BASKET;  Surgeon: Ihor Gully, MD;  Location[facility];  Service: Urology;  Laterality: Right;   HIATAL HERNIA REPAIR N/A 08/13/2018   Procedure: LAPAROSCOPIC REPAIR OF HIATAL HERNIA REPAIR WITH FUNDOPLICATION AND INSERTION OF MESH;  Surgeon: Glenna Fellows, MD;  Location: [facility];  Service: General;  Laterality: N/A;   HOLMIUM LASER APPLICATION Right  06/20/2016   Procedure: HOLMIUM LASER APPLICATION;  Surgeon: Ihor Gully, MD;  Location: [facility];  Service: Urology;  Laterality: Right;   IR GENERIC HISTORICAL  02/11/2016   IR RADIOLOGIST EVAL & MGMT 02/11/2016 [facility]   IR GENERIC HISTORICAL  02/15/2016   IR BONE TUMOR(S)RF ABLATION 02/15/2016 Julieanne Cotton, MD [facility]   IR GENERIC HISTORICAL  02/15/2016   IR BONE TUMOR(S)RF ABLATION 02/15/2016 Julieanne Cotton, MD [facility]   IR GENERIC HISTORICAL  02/15/2016   IR BONE TUMOR(S)RF ABLATION 02/15/2016 Julieanne Cotton, MD [facility]   IR GENERIC HISTORICAL  02/15/2016   IR KYPHO THORACIC WITH BONE BIOPSY 02/15/2016 Julieanne Cotton, MD [facility]   IR GENERIC HISTORICAL  02/15/2016   IR KYPHO THORACIC WITH BONE BIOPSY 02/15/2016 Julieanne Cotton, MD [facility]   IR GENERIC HISTORICAL  02/15/2016   IR VERTEBROPLASTY CERV/THOR BX INC UNI/BIL INC/INJECT/IMAGING 02/15/2016 Julieanne Cotton, MD [facility]   IR GENERIC HISTORICAL  03/13/2016   IR KYPHO EA ADDL LEVEL THORACIC OR LUMBAR 03/13/2016 Julieanne Cotton, MD [facility]   IR GENERIC HISTORICAL  03/13/2016   IR KYPHO EA ADDL LEVEL THORACIC OR LUMBAR 03/13/2016 Julieanne Cotton, MD [facility]   IR GENERIC HISTORICAL  03/13/2016   IR BONE TUMOR(S)RF ABLATION 03/13/2016 Julieanne Cotton, MD [facility]   IR GENERIC HISTORICAL  03/13/2016   IR KYPHO LUMBAR INC FX REDUCE BONE BX UNI/BIL CANNULATION INC/IMAGING 03/13/2016 Julieanne Cotton, MD [facility]   IR GENERIC HISTORICAL  03/13/2016   IR BONE TUMOR(S)RF ABLATION 03/13/2016 Julieanne Cotton, MD [facility]   IR GENERIC HISTORICAL  03/13/2016   IR BONE TUMOR(S)RF ABLATION 03/13/2016 Julieanne Cotton, MD [facility]   IR GENERIC HISTORICAL  03/31/2016   IR RADIOLOGIST EVAL & MGMT 03/31/2016 [facility]   KYPHOPLASTY     02/2016, 03/2016, 11/2016   LAPAROSCOPIC INGUINAL HERNIA REPAIR Bilateral 12-16-2013  dr gross   RADIOLOGY WITH ANESTHESIA N/A  02/15/2016   Procedure: Spinal Ablation;  Surgeon: Julieanne Cotton, MD;  Location: [facility];  Service: Radiology;  Laterality: N/A;   RADIOLOGY WITH ANESTHESIA N/A 03/13/2016   Procedure: LUMBER ABLATION;  Surgeon: Julieanne Cotton, MD;  Location: [facility];  Service: Radiology;  Laterality: N/A;   ROTATOR CUFF REPAIR Right 2003   spine operation     x1 in 2018 x3 in 2017   TONSILLECTOMY  age 22   VERTEBROPLASTY  01/2016   WISDOM TOOTH EXTRACTION     Social History:  reports that he quit smoking about 42 years ago. His smoking use included cigarettes. He started smoking about 49 years ago. He has a 7 pack-year smoking history. He has never used smokeless tobacco. He reports current alcohol use. He reports that he does not use drugs.  Allergies  Allergen Reactions   Crestor [Rosuvastatin Calcium] Other (See Comments)    Achilles tendonitis   Singulair [Montelukast] Other (See Comments)    Mood change    Family History  Problem Relation Age of Onset   Uterine cancer Mother    Ovarian cancer Mother  Colon cancer Mother        said it was rectal or colon but not sure   Rectal cancer Mother    Heart disease Father    Hypertension Father    Multiple sclerosis Sister    Paranoid behavior Brother    Drug abuse Brother    Schizophrenia Brother    Stroke Maternal Grandfather    Cancer Maternal Aunt    Leukemia Paternal Aunt    Healthy Son        x1   Healthy Daughter        x2   Allergies Daughter        x1   Diabetes Neg Hx    Alzheimer's disease Neg Hx    Parkinson's disease Neg Hx    Esophageal cancer Neg Hx    Stomach cancer Neg Hx      Prior to Admission medications   Medication Sig Start Date End Date Taking? Authorizing Provider  acetaminophen (TYLENOL) 325 MG tablet Take 2 tablets (650 mg total) by mouth every 6 (six) hours as needed for mild pain (or Fever >/= 101). 02/04/18   Rodolph Bong, MD  allopurinol (ZYLOPRIM) 100 MG tablet TAKE 1 TABLET BY MOUTH EVERY DAY  09/11/23   Saguier, Ramon Dredge, PA-C  amLODipine (NORVASC) 5 MG tablet TAKE 1 TABLET (5 MG TOTAL) BY MOUTH DAILY. 09/11/23 12/10/23  Saguier, Ramon Dredge, PA-C  budesonide-formoterol (SYMBICORT) 160-4.5 MCG/ACT inhaler TAKE 2 PUFFS BY MOUTH TWICE A DAY 11/10/22   Josph Macho, MD  Cholecalciferol (VITAMIN D3) 250 MCG (10000 UT) capsule Take 10,000 Units by mouth daily.    [provider]  colchicine 0.6 MG tablet Take 1 tablet (0.6 mg total) by mouth 2 (two) times daily as needed (when having a gout flare). 07/31/23   Saguier, Ramon Dredge, PA-C  fluorouracil (EFUDEX) 5 % cream Apply 1 Application topically 2 (two) times daily as needed (For rash on head). 08/31/19   [provider]  gabapentin (NEURONTIN) 300 MG capsule Take 1 capsule (300 mg total) by mouth 2 (two) times daily. 10/07/22   Saguier, Ramon Dredge, PA-C  ipratropium (ATROVENT) 0.03 % nasal spray PLACE 1-2 SPRAYS INTO BOTH NOSTRILS 2 (TWO) TIMES DAILY AS NEEDED (NASAL DRAINAGE). 08/28/23   Ellamae Sia, DO  ketoconazole (NIZORAL) 2 % cream Apply 1 Application topically 2 (two) times daily. 08/20/23   [provider]  lenalidomide (REVLIMID) 5 MG capsule TAKE 1 CAPSULE BY MOUTH DAILY  FOR 21 DAYS, THEN 7 DAYS OFF Auth# [id number] 09/11/23   Josph Macho, MD  loperamide (IMODIUM) 2 MG capsule Take 2 mg by mouth as needed for diarrhea or loose stools.    [provider]  Omega-3 Fatty Acids (FISH OIL HIGH POTENCY PO) Take by mouth daily.    [provider]  valsartan (DIOVAN) 160 MG tablet TAKE 1 TABLET BY MOUTH EVERY DAY 06/18/23   Leslye Peer, MD  XARELTO 10 MG TABS tablet TAKE 1 TABLET BY MOUTH EVERY DAY 12/01/22   Josph Macho, MD  Zoledronic Acid (ZOMETA IV) Inject into the vein every 3 (three) months.    [provider]  zolpidem (AMBIEN) 5 MG tablet TAKE 1 TABLET BY MOUTH EVERY DAY AT BEDTIME AS NEEDED FOR SLEEP 02/05/23   Saguier, Ramon Dredge, PA-C    Physical Exam: BP (!) 133/93   Pulse 63   Temp  97.6 F (36.4 C)   Resp 18   Ht 5\' 7"  (1.702 m)  Wt 84.4 kg   SpO2 99%   BMI 29.14 kg/m  General:  Alert, oriented, calm, in no acute distress  Eyes: EOMI, clear conjuctivae, white sclerea Neck: supple, no masses, trachea mildline  Cardiovascular: RRR, no murmurs or rubs, no peripheral edema  Respiratory: clear to auscultation bilaterally, no wheezes, no crackles  Abdomen: soft, nontender, nondistended, normal bowel tones heard  Skin: Left lower extremity is wrapped, and dressing not removed.  I was able to review photograph from ER stay this morning, in Dr. York Spaniel note. Musculoskeletal: no joint effusions, normal range of motion  Psychiatric: appropriate affect, normal speech  Neurologic: extraocular muscles intact, clear speech, moving all extremities with intact sensorium         Labs on Admission:  Basic Metabolic Panel: Recent Labs  Lab 09/13/23 1157  NA 137  K 3.8  CL 104  CO2 23  GLUCOSE 86  BUN 14  CREATININE 1.13  CALCIUM 8.9   Liver Function Tests: No results for input(s): "AST", "ALT", "ALKPHOS", "BILITOT", "PROT", "ALBUMIN" in the last 168 hours. No results for input(s): "LIPASE", "AMYLASE" in the last 168 hours. No results for input(s): "AMMONIA" in the last 168 hours. CBC: Recent Labs  Lab 09/13/23 1157  WBC 3.8*  NEUTROABS 1.7  HGB 13.7  HCT 40.5  MCV 103.6*  PLT 185   Cardiac Enzymes: No results for input(s): "CKTOTAL", "CKMB", "CKMBINDEX", "TROPONINI" in the last 168 hours. BNP (last 3 results) No results for input(s): "BNP" in the last 8760 hours.  ProBNP (last 3 results) No results for input(s): "PROBNP" in the last 8760 hours.  CBG: No results for input(s): "GLUCAP" in the last 168 hours.  Radiological Exams on Admission: DG Tibia/Fibula Left Result Date: 09/13/2023 CLINICAL DATA:  Left lower leg laceration 2 weeks ago, purulent drainage and swelling EXAM: LEFT TIBIA AND FIBULA - 2 VIEW COMPARISON:  02/12/2022 FINDINGS: Frontal and  lateral views of the left tibia and fibula are obtained. There are no acute or destructive bony abnormalities. Alignment of the left knee and ankle is anatomic. Diffuse subcutaneous edema. IMPRESSION: 1. Diffuse subcutaneous edema which could reflect cellulitis given history of laceration. 2. No acute or destructive bony abnormalities. Electronically Signed   By: Sharlet Salina M.D.   On: 09/13/2023 12:52   US Venous Img Lower Unilateral Left Result Date: 09/13/2023 CLINICAL DATA:  Lower extremity edema and the calf wound after cutting it on a dock while getting out of a boat 9 days previously. EXAM: LEFT LOWER EXTREMITY VENOUS DOPPLER ULTRASOUND TECHNIQUE: Gray-scale sonography with compression, as well as color and duplex ultrasound, were performed to evaluate the deep venous system(s) from the level of the common femoral vein through the popliteal and proximal calf veins. COMPARISON:  None Available. FINDINGS: VENOUS Normal compressibility of the common femoral, superficial femoral, and popliteal veins, as well as the visualized calf veins. Visualized portions of profunda femoral vein and great saphenous vein unremarkable. No filling defects to suggest DVT on grayscale or color Doppler imaging. Doppler waveforms show normal direction of venous flow, normal respiratory plasticity and response to augmentation. Limited views of the contralateral common femoral vein are unremarkable. OTHER None. Limitations: none IMPRESSION: Negative. Electronically Signed   By: Malachy Moan M.D.   On: 09/13/2023 12:21   Assessment/Plan Kaedan Richert is a 68 y.o. [name] with medical history significant for multiple myeloma in remission, hypertension being admitted to the hospital with left lower extremity cellulitis.   Left lower extremity cellulitis-without evidence of sepsis,  or deeper infection.  Mild swelling, left lower extremity Doppler [name] negative for DVT.  X-rays done today without evidence of subcutaneous gas,  foreign body or other abnormality.  This was a freshwater injury, empiric coverage should treat for Aeromonas as well as broad gram-negative and gram-positive pathogens. -Inpatient admission -Empiric IV Levaquin and Rocephin -Monitor for clinical response, consider ID consultation if fails to improve significantly  Hypertension-Norvasc and ARB  Multiple myeloma-in remission, currently Revlimid is on hold, patient plans close outpatient follow-up with his oncologist Dr. Myna Hidalgo  History of DVT - continue Xarelto    Code Status: Full Code  Consults called: Dr. Myna Hidalgo added to treatment team  Admission status: The appropriate patient status for this patient is INPATIENT. Inpatient status is judged to be reasonable and necessary in order to provide the required intensity of service to ensure the patient's safety. The patient's presenting symptoms, physical exam findings, and initial radiographic and laboratory data in the context of their chronic comorbidities is felt to place them at high risk for further clinical deterioration. Furthermore, it is not anticipated that the patient will be medically stable for discharge from the hospital within 2 midnights of admission.    I certify that at the point of admission it is my clinical judgment that the patient will require inpatient hospital care spanning beyond 2 midnights from the point of admission due to high intensity of service, high risk for further deterioration and high frequency of surveillance required  Time spent: 52 minutes  Windsor Zirkelbach Sharlette Dense MD [facility] Pager [phone]  If 7PM-7AM, please contact night-coverage [email or web address] Password [facility]  09/13/2023, 3:43 PM

## 2023-09-13 NOTE — Plan of Care (Signed)
   Problem: Education: Goal: Knowledge of General Education information will improve Description Including pain rating scale, medication(s)/side effects and non-pharmacologic comfort measures Outcome: Progressing

## 2023-09-13 NOTE — ED Notes (Signed)
 ED Provider at bedside.
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
## 2023-09-14 ENCOUNTER — Encounter: Payer: Self-pay | Admitting: Medical

## 2023-09-14 ENCOUNTER — Inpatient Hospital Stay: Payer: Medicare Other | Admitting: Medical

## 2023-09-14 DIAGNOSIS — S81802A Unspecified open wound, left lower leg, initial encounter: Secondary | ICD-10-CM

## 2023-09-14 DIAGNOSIS — Z77111 Contact with and (suspected) exposure to water pollution: Secondary | ICD-10-CM | POA: Diagnosis not present

## 2023-09-14 DIAGNOSIS — L03116 Cellulitis of left lower limb: Secondary | ICD-10-CM | POA: Diagnosis not present

## 2023-09-14 LAB — CBC
HCT: 38.2 % — ABNORMAL LOW (ref 39.0–52.0)
Hemoglobin: 12.9 g/dL — ABNORMAL LOW (ref 13.0–17.0)
MCH: 35.6 pg — ABNORMAL HIGH (ref 26.0–34.0)
MCHC: 33.8 g/dL (ref 30.0–36.0)
MCV: 105.5 fL — ABNORMAL HIGH (ref 80.0–100.0)
Platelets: 172 10*3/uL (ref 150–400)
RBC: 3.62 MIL/uL — ABNORMAL LOW (ref 4.22–5.81)
RDW: 13.9 % (ref 11.5–15.5)
WBC: 4.2 10*3/uL (ref 4.0–10.5)
nRBC: 0 % (ref 0.0–0.2)

## 2023-09-14 LAB — BASIC METABOLIC PANEL
Anion gap: 7 (ref 5–15)
BUN: 13 mg/dL (ref 8–23)
CO2: 23 mmol/L (ref 22–32)
Calcium: 8.5 mg/dL — ABNORMAL LOW (ref 8.9–10.3)
Chloride: 108 mmol/L (ref 98–111)
Creatinine, Ser: 1.08 mg/dL (ref 0.61–1.24)
GFR, Estimated: >60 mL/min (ref >60)
Glucose, Bld: 86 mg/dL (ref 70–99)
Potassium: 4 mmol/L (ref 3.5–5.1)
Sodium: 138 mmol/L (ref 135–145)

## 2023-09-14 MED ORDER — VITAMIN D 25 MCG (1000 UNIT) PO TABS
10000.0000 [IU] | ORAL_TABLET | Freq: Every day | ORAL | Status: DC
  Filled 2023-09-14: qty 10

## 2023-09-14 MED ORDER — HYDROCODONE-ACETAMINOPHEN 5-325 MG PO TABS: 1.0000 | ORAL_TABLET | Freq: Three times a day (TID) | ORAL | Status: DC | PRN

## 2023-09-14 MED ORDER — IPRATROPIUM BROMIDE 0.03 % NA SOLN: 1.0000 | Freq: Two times a day (BID) | NASAL | Status: DC | PRN

## 2023-09-14 MED ORDER — SODIUM CHLORIDE 0.9 % IV SOLN
2.0000 g | Freq: Three times a day (TID) | INTRAVENOUS | Status: DC
  Administered 2023-09-14 – 2023-09-16 (×6): 2 g via INTRAVENOUS
  Filled 2023-09-14 (×7): qty 12.5

## 2023-09-14 MED ORDER — DOXYCYCLINE HYCLATE 100 MG PO TABS
100.0000 mg | ORAL_TABLET | Freq: Two times a day (BID) | ORAL | Status: DC
Start: 2023-09-14 — End: 2023-09-16
  Administered 2023-09-14 – 2023-09-16 (×5): 100 mg via ORAL
  Filled 2023-09-14 (×5): qty 1

## 2023-09-14 NOTE — Progress Notes (Signed)
   09/14/23 1431  TOC Brief Assessment  Insurance and Status Reviewed  Patient has primary care physician Yes  Home environment has been reviewed Resides in private residence with spouse  Prior level of function: Independent  Prior/Current Home Services No current home services  Social Drivers of Health Review SDOH reviewed no interventions necessary  Readmission risk has been reviewed Yes  Transition of care needs no transition of care needs at this time
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
## 2023-09-14 NOTE — Consult Note (Signed)
 WOC Nurse Consult Note: patient has history of L lower leg laceration having received 23 stitches, has received oral antibiotics for cellulitis, now admitted for IV antibiotics  Reason for Consult:  leg wounds Wound type: full thickness post laceration  Pressure Injury POA: NA  Measurement: see nursing flowsheet  Wound [id number] area of eschar  Drainage (amount, consistency, odor)appears dry in photo documentation  Periwound: edema, erythema  Dressing procedure/placement/frequency: Clean left lower leg wound with soap and water, apply Xeroform gauze to area of eschar, cover with silicone foam or ABD pad and Kerlix roll gauze and tape whichever is preferred.    POC discussed with bedside nurse. WOC team will not follow. Re-consult if further needs arise.   Thank you,    Priscella Mann MSN, RN-BC, [employer] [phone]

## 2023-09-14 NOTE — Progress Notes (Signed)
 PROGRESS NOTE Jorge [name]  UEA:[id number] DOB: [birth year] DOA: 09/13/2023 PCP: Esperanza Richters, PA-C  Brief Narrative/Hospital Course: 68 y.o. male with medical history significant for multiple myeloma in remission, hypertension  who had a left leg boat injury with leg laceration 2 wks ago in [place] needing 23 sutures and had infection and was admittedx 3 days and treated now being admitted 09/14/23 w/ yellow drainage, worsening swelling small area of redness concerning for worsening infection despite being on Cipro and Bactrim.  Procedures: X-ray left tibia-fibula>> diffuse s/c edema that could reflect cellulitis, no acute or destructive bony abnormalities. Duplex of LLE>>negative.  Consultation: None  Subjective: Seen and examined this morning Overall he feels pain and swelling is improving, some extension of the discoloration beyond the demarcation Overnight afebrile saturating well on room air, BP 100-130s Labs with stable electrolytes   Assessment and Plan: Principal Problem:   Cellulitis, leg   LLE cellulitis Boat injury with laceration 2 Wks PTA S/P 23  sutures. IV antibiotics x3 days in [place] and on Cipro and Bactrim PTA: X-ray duplex of the leg fairly benign/no DVT.He reports his stitches removed in [place]. On Levaquin/Rocephin>ID and wound care has been consulted to adjust antibiotic as he failed oral antibiotics changed to Cefepime.  HTN: Pta on amlodipine/ARB resumed.  Multiple myeloma in remission Mild macrocytic anemia: Revlimid on hold and followed by Dr. Myna Hidalgo. Monitor Hb.  History of DVT: Pta on Xarelto,CONT  DVT prophylaxis: rivaroxaban (XARELTO) tablet 10 mg Start: 09/13/23 1900 Code Status:   Code Status: Full Code Family Communication: plan of care discussed with patient at bedside. Patient status is: Remains hospitalized because of severity of illness Level of care: Med-Surg   Dispo: The patient is from: Home            Anticipated disposition:  TBD.  Objective: Vitals last 24 hrs: Vitals:   09/13/23 1600 09/13/23 2108 09/14/23 0105 09/14/23 0558  BP:  129/78 102/64 107/71  Pulse:  80 72 65  Resp:  16 15 16   Temp:  97.9 F (36.6 C) 98.5 F (36.9 C) 97.9 F (36.6 C)  TempSrc:  Oral Oral Oral  SpO2:  98% 96% 96%  Weight: 84.4 kg     Height: 5\' 7"  (1.702 m)      Weight change:   Physical Examination: General exam: alert awake,at baseline, older than stated age HEENT:Oral mucosa moist, Ear/Nose WNL grossly Respiratory system: Bilaterally clear BS,no use of accessory muscle Cardiovascular system: S1 & S2 +, No JVD. Gastrointestinal system: Abdomen soft,NT,ND, BS+ Nervous System: Alert, awake, moving all extremities,and following commands. Extremities: LE edema neg, left leg with wound covered in dressing small discoloration beyond demarcated line  no pitting edema Skin: No rashes,no icterus. MSK: Normal muscle bulk,tone, power    Medications reviewed:  Scheduled Meds:  allopurinol  100 mg Oral Daily   amLODipine  5 mg Oral Daily   doxycycline  100 mg Oral Q12H   gabapentin  300 mg Oral BID   irbesartan  150 mg Oral Daily   rivaroxaban  10 mg Oral Q supper   Continuous Infusions:  ceFEPime (MAXIPIME) IV      Diet Order             Diet regular Room service appropriate? Yes; Fluid consistency: Thin  Diet effective now                  Intake/Output Summary (Last 24 hours) at 09/14/2023 1335 Last data filed at 09/14/2023 0600 Gross  per 24 hour  Intake 1710.87 ml  Output --  Net 1710.87 ml   Net IO Since Admission: 1,710.87 mL [09/14/23 1335]  Wt Readings from Last 3 Encounters:  09/13/23 84.4 kg  08/28/23 83 kg  06/05/23 83.4 kg     Unresulted Labs (From admission, onward)     Start     Ordered   09/15/23 0500  CBC  Daily,   R      09/14/23 0812   09/15/23 0500  Basic metabolic panel  Daily,   R      09/14/23 0812           Data Reviewed: I have personally reviewed following labs and  imaging studies CBC: Recent Labs  Lab 09/13/23 1157 09/14/23 0512  WBC 3.8* 4.2  NEUTROABS 1.7  --   HGB 13.7 12.9*  HCT 40.5 38.2*  MCV 103.6* 105.5*  PLT 185 172   Basic Metabolic Panel:  Recent Labs  Lab 09/13/23 1157 09/14/23 0512  NA 137 138  K 3.8 4.0  CL 104 108  CO2 23 23  GLUCOSE 86 86  BUN 14 13  CREATININE 1.13 1.08  CALCIUM 8.9 8.5*  GFR: Estimated Creatinine Clearance: 68.9 mL/min (by C-G formula based on SCr of 1.08 mg/dL). Sepsis Labs: No results for input(s): "PROCALCITON", "LATICACIDVEN" in the last 168 hours. Recent Results (from the past 240 hours)  Culture, blood (routine x 2)     Status: None (Preliminary result)   Collection Time: 09/13/23 11:56 AM   Specimen: BLOOD  Result Value Ref Range Status   Specimen Description   Final    BLOOD LEFT ANTECUBITAL Performed at [facility], [address] [place], [place] [address]    Special Requests   Final    BOTTLES DRAWN AEROBIC AND ANAEROBIC Blood Culture adequate volume Performed at [facility], [address] [place], [place] [address]    Culture   Final    NO GROWTH < 24 HOURS Performed at [facility], [address], [place], [place] [address]    Report Status PENDING  Incomplete  Culture, blood (routine x 2)     Status: None (Preliminary result)   Collection Time: 09/13/23 11:56 AM   Specimen: BLOOD RIGHT HAND  Result Value Ref Range Status   Specimen Description   Final    BLOOD RIGHT HAND Performed at [facility], [address] [place], [place] [address]    Special Requests   Final    BOTTLES DRAWN AEROBIC AND ANAEROBIC Blood Culture results may not be optimal due to an inadequate volume of blood received in culture bottles Performed at [facility], [address] [place], [place] [address]    Culture   Final    NO GROWTH < 24 HOURS Performed at [facility], [address] [place] [address] [place], [place] [address]    Report Status  PENDING  Incomplete    Antimicrobials/Microbiology: Anti-infectives (From admission, onward)    Start     Dose/Rate Route Frequency Ordered Stop   09/14/23 1400  ceFEPIme (MAXIPIME) 2 g in sodium chloride 0.9 % 100 mL IVPB        2 g 200 mL/hr over 30 Minutes Intravenous Every 8 hours 09/14/23 1241     09/14/23 1400  doxycycline (VIBRA-TABS) tablet 100 mg        100 mg Oral Every 12 hours 09/14/23 1241  09/14/23 1300  levofloxacin (LEVAQUIN) IVPB 500 mg  Status:  Discontinued        500 mg 100 mL/hr over 60 Minutes Intravenous Every 24 hours 09/13/23 1615 09/14/23 1241   09/14/23 1300  cefTRIAXone (ROCEPHIN) 1 g in sodium chloride 0.9 % 100 mL IVPB  Status:  Discontinued        1 g 200 mL/hr over 30 Minutes Intravenous Every 24 hours 09/13/23 1615 09/14/23 1241   09/13/23 1245  cefTRIAXone (ROCEPHIN) 1 g in sodium chloride 0.9 % 100 mL IVPB        1 g 200 mL/hr over 30 Minutes Intravenous  Once 09/13/23 1233 09/13/23 1447   09/13/23 1245  levofloxacin (LEVAQUIN) IVPB 750 mg        750 mg 100 mL/hr over 90 Minutes Intravenous  Once 09/13/23 1233 09/13/23 1447         Component Value Date/Time   SDES  09/13/2023 1156    BLOOD LEFT ANTECUBITAL Performed at [facility], [address] [place], [place] [address]    SDES  09/13/2023 1156    BLOOD RIGHT HAND Performed at [facility], [address] [place], [place] [address]    [facility]  09/13/2023 1156    BOTTLES DRAWN AEROBIC AND ANAEROBIC Blood Culture adequate volume Performed at [facility], [address] [place], [place] [address]    [facility]  09/13/2023 1156    BOTTLES DRAWN AEROBIC AND ANAEROBIC Blood Culture results may not be optimal due to an inadequate volume of blood received in culture bottles Performed at [facility], [address] [place], [place] [address]    CULT  09/13/2023 1156    NO GROWTH < 24 HOURS Performed at [facility], [address] [place], [place] [address]    CULT  09/13/2023 1156    NO GROWTH < 24 HOURS Performed at [facility], [address] [place], [place] [address]    REPTSTATUS PENDING 09/13/2023 1156   REPTSTATUS PENDING 09/13/2023 1156     Radiology Studies: DG Tibia/Fibula Left Result Date: 09/13/2023 CLINICAL DATA:  Left lower leg laceration 2 weeks ago, purulent drainage and swelling EXAM: LEFT TIBIA AND FIBULA - 2 VIEW COMPARISON:  02/12/2022 FINDINGS: Frontal and lateral views of the left tibia and fibula are obtained. There are no acute or destructive bony abnormalities. Alignment of the left knee and ankle is anatomic. Diffuse subcutaneous edema. IMPRESSION: 1. Diffuse subcutaneous edema which could reflect cellulitis given history of laceration. 2. No acute or destructive bony abnormalities. Electronically Signed   By: Sharlet Salina M.D.   On: 09/13/2023 12:52   US Venous Img Lower Unilateral Left Result Date: 09/13/2023 CLINICAL DATA:  Lower extremity edema and the calf wound after cutting it on a dock while getting out of a boat 9 days previously. EXAM: LEFT LOWER EXTREMITY VENOUS DOPPLER ULTRASOUND TECHNIQUE: Gray-scale sonography with compression, as well as color and duplex ultrasound, were performed to evaluate the deep venous system(s) from the level of the common femoral vein through the popliteal and proximal calf veins. COMPARISON:  None Available. FINDINGS: VENOUS Normal compressibility of the common femoral, superficial femoral, and popliteal veins, as well as the visualized calf veins. Visualized portions of profunda femoral vein and great saphenous vein unremarkable. No filling defects to suggest DVT on grayscale or color Doppler imaging. Doppler waveforms show normal direction of venous flow, normal respiratory plasticity  and response to augmentation. Limited views of the contralateral common femoral vein are unremarkable. OTHER None. Limitations: none IMPRESSION: Negative.  Electronically Signed   By: Malachy Moan M.D.   On: 09/13/2023 12:21     LOS: 1 day   Total time spent in review of labs and imaging, patient evaluation, formulation of plan, documentation and communication with family: 35 minutes  Lanae Boast, MD  [facility]  09/14/2023, 1:35 PM
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
## 2023-09-14 NOTE — Consult Note (Signed)
 [name] is well-known to me.  He is a very nice 68 year old white male.  He has a history of myeloma.  He did undergo stem cell transplantation several years ago.  Currently, he GIST is on Revlimid.  I last saw him on 08/28/2023.  He was getting ready go out to [place].  He had been feeling fine.  He also had been on Xarelto because of a history of DVT.  When I last saw him, his IgG level was okay 1108 mg/dL.  While in [place], he had a accident.  He cut his left lower leg.  He required 23 stitches.  He subsequently got a flight to come home.  He was admitted on 09/13/2023.  He was admitted because of cellulitis.  He said that the wound was cultured out in [place] but he did not think there was any bacteria.  When he was admitted, his white cell count 3.8.  Hemoglobin 13.7.  Platelet count 185,000.  His ANC was 1700.  His sodium 137.  Potassium 3.8.  BUN 14 creatinine 1.13.  He is on antibiotics with Rocephin and Levaquin.   He looks pretty good.  He feels pretty good.  His vital signs all look stable.  Temperature 97.9.  Pulse 65.  Blood pressure 107/71.  His lungs are clear bilaterally.  Cardiac exam regular rate and rhythm.  He has no murmurs.  Abdomen is soft.  Bowel sounds are present.  There is no fluid wave.  There is no palpable liver or spleen tip.  Extremity shows the left lower leg to be dressed and wrapped.  He has decent pulse in the distal arteries.  Neurological exam shows no focal neurological deficits.   Mr. [name] is a nice 68 year old gentleman.  He has myeloma but this has been in remission.  He is on Revlimid.  He now has a infection in the left lower leg.  I would keep him off the Revlimid for right now.  Again, his IgG level is adequate.  His white cell count is adequate.  He should be able to mount a good response to this infection.  Hopefully, he will be able to go home soon.  Hopefully be able to be put on oral antibiotics.  I would double check out in  [place] at the hospital to see if there is any positive cultures from the wound.  I know that the staff are doing a great job with him on 3 E.   Christin Bach, MD   Hebrews 12:12

## 2023-09-14 NOTE — Plan of Care (Signed)
  Problem: Clinical Measurements: Goal: Will remain free from infection Outcome: Progressing Goal: Diagnostic test results will improve Outcome: Progressing   Problem: Skin Integrity: Goal: Risk for impaired skin integrity will decrease Outcome: Progressing   

## 2023-09-14 NOTE — Plan of Care (Signed)
   Problem: Clinical Measurements: Goal: Diagnostic test results will improve Outcome: Progressing

## 2023-09-15 DIAGNOSIS — L03116 Cellulitis of left lower limb: Secondary | ICD-10-CM | POA: Diagnosis not present

## 2023-09-15 LAB — CBC
HCT: 39.3 % (ref 39.0–52.0)
Hemoglobin: 13.3 g/dL (ref 13.0–17.0)
MCH: 36.1 pg — ABNORMAL HIGH (ref 26.0–34.0)
MCHC: 33.8 g/dL (ref 30.0–36.0)
MCV: 106.8 fL — ABNORMAL HIGH (ref 80.0–100.0)
Platelets: 187 10*3/uL (ref 150–400)
RBC: 3.68 MIL/uL — ABNORMAL LOW (ref 4.22–5.81)
RDW: 14 % (ref 11.5–15.5)
WBC: 3.7 10*3/uL — ABNORMAL LOW (ref 4.0–10.5)
nRBC: 0 % (ref 0.0–0.2)

## 2023-09-15 LAB — BASIC METABOLIC PANEL
Anion gap: 8 (ref 5–15)
BUN: 13 mg/dL (ref 8–23)
CO2: 24 mmol/L (ref 22–32)
Calcium: 8.6 mg/dL — ABNORMAL LOW (ref 8.9–10.3)
Chloride: 106 mmol/L (ref 98–111)
Creatinine, Ser: 1.12 mg/dL (ref 0.61–1.24)
GFR, Estimated: >60 mL/min (ref >60)
Glucose, Bld: 95 mg/dL (ref 70–99)
Potassium: 4.5 mmol/L (ref 3.5–5.1)
Sodium: 138 mmol/L (ref 135–145)

## 2023-09-15 NOTE — Progress Notes (Signed)
 PROGRESS NOTE Jorge [name]  ZOX:[id number] DOB: Sep 20, 1955 DOA: 09/13/2023 PCP: Esperanza Richters, PA-C  Brief Narrative/Hospital Course: 68 y.o. male with medical history significant for multiple myeloma in remission, hypertension  who had a left leg boat injury with leg laceration 2 wks ago in [place] needing 23 sutures and had infection and was admittedx 3 days and treated now being admitted 09/14/23 w/ yellow drainage, worsening swelling small area of redness concerning for worsening infection despite being on Cipro and Bactrim. ID was consulted > on 3/3 antibiotics switched to doxycycline and cefepime for skin and soft tissue infection  Procedures: X-ray left tibia-fibula>> diffuse s/c edema that could reflect cellulitis, no acute or destructive bony abnormalities. Duplex of LLE>>negative.  Consultation: Infectious disease  Subjective: Seen this morning  Reports pain is more superficial now, dressing not changed yet for today  Overall he feels much improved Overnight afebrile, BP stable   Assessment and Plan: Principal Problem:   Cellulitis, leg   LLE cellulitis Boat injury with laceration 2 Wks PTA S/P 23  sutures. IV antibiotics x3 days in [place] and on Cipro and Bactrim PTA: X-ray duplex of the leg fairly benign/no DVT.He reports his stitches removed in [place]. On Levaquin/Rocephin on admit> ID was consulted > on 3/3 antibiotics switched to doxycycline and cefepime for skin and soft tissue infection. Dressing change pending this morning asked nursing staff to change it: on dressing change RN noted- pain much better- some room. having some yellowish purulent looking drainage in the very center of incision but it is only small amount and redness is better. Keep on iv antibiotics x 24 hrs. ID following, input appreciated  HTN: Stable. PTA on amlodipine/ARB cont same.  Multiple myeloma in remission Mild macrocytic anemia: Revlimid on hold and followed by Dr. Myna Hidalgo. Monitor  Hb.  History of DVT: PTA on Xarelto,cont same.  DVT prophylaxis: rivaroxaban (XARELTO) tablet 10 mg Start: 09/13/23 1900 Code Status:   Code Status: Full Code Family Communication: plan of care discussed with patient at bedside. Patient status is: Remains hospitalized because of severity of illness Level of care: Med-Surg   Dispo: The patient is from: Home            Anticipated disposition: TBD 1-2 DAYS.  Objective: Vitals last 24 hrs: Vitals:   09/15/23 0535 09/15/23 0536 09/15/23 0720 09/15/23 0929  BP: 120/77  117/84 123/80  Pulse: (!) 58 61 64 62  Resp: 16  16 16   Temp: 98.3 F (36.8 C)  97.6 F (36.4 C) 98.5 F (36.9 C)  TempSrc: Oral  Oral Oral  SpO2: 99%  96% 97%  Weight:      Height:       Weight change:   Physical Examination: General exam: alert awake, oriented at baseline, older than stated age HEENT:Oral mucosa moist, Ear/Nose WNL grossly Respiratory system: Bilaterally clear BS,no use of accessory muscle Cardiovascular system: S1 & S2 +, No JVD. Gastrointestinal system: Abdomen soft,NT,ND, BS+ Nervous System: Alert, awake, moving all extremities,and following commands. Extremities: Lt LLE with dressing in place, less tender, dressing change pending this morning she did not remove  Skin: No rashes,no icterus. MSK: Normal muscle bulk,tone, power    Medications reviewed:  Scheduled Meds:  allopurinol  100 mg Oral Daily   amLODipine  5 mg Oral Daily   doxycycline  100 mg Oral Q12H   gabapentin  300 mg Oral BID   irbesartan  150 mg Oral Daily   rivaroxaban  10 mg Oral Q supper  Continuous Infusions:  ceFEPime (MAXIPIME) IV 2 g (09/15/23 0542)    Diet Order             Diet regular Room service appropriate? Yes; Fluid consistency: Thin  Diet effective now                  Intake/Output Summary (Last 24 hours) at 09/15/2023 1139 Last data filed at 09/15/2023 0908 Gross per 24 hour  Intake 1320 ml  Output --  Net 1320 ml   Net IO Since  Admission: 3,510.87 mL [09/15/23 1139]  Wt Readings from Last 3 Encounters:  09/13/23 84.4 kg  08/28/23 83 kg  06/05/23 83.4 kg     Unresulted Labs (From admission, onward)     Start     Ordered   09/15/23 0500  CBC  Daily,   R      09/14/23 0812   09/15/23 0500  Basic metabolic panel  Daily,   R      09/14/23 0812           Data Reviewed: I have personally reviewed following labs and imaging studies CBC: Recent Labs  Lab 09/13/23 1157 09/14/23 0512 09/15/23 0518  WBC 3.8* 4.2 3.7*  NEUTROABS 1.7  --   --   HGB 13.7 12.9* 13.3  HCT 40.5 38.2* 39.3  MCV 103.6* 105.5* 106.8*  PLT 185 172 187   Basic Metabolic Panel:  Recent Labs  Lab 09/13/23 1157 09/14/23 0512 09/15/23 0518  NA 137 138 138  K 3.8 4.0 4.5  CL 104 108 106  CO2 23 23 24   GLUCOSE 86 86 95  BUN 14 13 13   CREATININE 1.13 1.08 1.12  CALCIUM 8.9 8.5* 8.6*  GFR: Estimated Creatinine Clearance: 66.4 mL/min (by C-G formula based on SCr of 1.12 mg/dL). Sepsis Labs: No results for input(s): "PROCALCITON", "LATICACIDVEN" in the last 168 hours. Recent Results (from the past 240 hours)  Culture, blood (routine x 2)     Status: None (Preliminary result)   Collection Time: 09/13/23 11:56 AM   Specimen: BLOOD  Result Value Ref Range Status   Specimen Description   Final    BLOOD LEFT ANTECUBITAL Performed at [facility], [address] [place], [place] [address]    Special Requests   Final    BOTTLES DRAWN AEROBIC AND ANAEROBIC Blood Culture adequate volume Performed at [facility], [address] [place], [place] [address]    Culture   Final    NO GROWTH 2 DAYS Performed at [facility], [address], [place], [place] [address]    Report Status PENDING  Incomplete  Culture, blood (routine x 2)     Status: None (Preliminary result)   Collection Time: 09/13/23 11:56 AM   Specimen: BLOOD RIGHT HAND  Result Value Ref Range Status   Specimen Description   Final     BLOOD RIGHT HAND Performed at [facility], [address] [place], [place] [address]    Special Requests   Final    BOTTLES DRAWN AEROBIC AND ANAEROBIC Blood Culture results may not be optimal due to an inadequate volume of blood received in culture bottles Performed at [facility], [address] [place], [place] [address]    Culture   Final    NO GROWTH 2 DAYS Performed at [facility], [address], [place], [place] [address]    Report Status PENDING  Incomplete    Antimicrobials/Microbiology: Anti-infectives (From admission, onward)    Start     Dose/Rate Route Frequency Ordered Stop   09/14/23 1400  ceFEPIme (MAXIPIME) 2 g in sodium chloride 0.9 % 100 mL IVPB        2 g 200 mL/hr over 30 Minutes Intravenous Every 8 hours 09/14/23 1241     09/14/23 1400  doxycycline (VIBRA-TABS) tablet 100 mg        100 mg Oral Every 12 hours 09/14/23 1241     09/14/23 1300  levofloxacin (LEVAQUIN) IVPB 500 mg  Status:  Discontinued        500 mg 100 mL/hr over 60 Minutes Intravenous Every 24 hours 09/13/23 1615 09/14/23 1241   09/14/23 1300  cefTRIAXone (ROCEPHIN) 1 g in sodium chloride 0.9 % 100 mL IVPB  Status:  Discontinued        1 g 200 mL/hr over 30 Minutes Intravenous Every 24 hours 09/13/23 1615 09/14/23 1241   09/13/23 1245  cefTRIAXone (ROCEPHIN) 1 g in sodium chloride 0.9 % 100 mL IVPB        1 g 200 mL/hr over 30 Minutes Intravenous  Once 09/13/23 1233 09/13/23 1447   09/13/23 1245  levofloxacin (LEVAQUIN) IVPB 750 mg        750 mg 100 mL/hr over 90 Minutes Intravenous  Once 09/13/23 1233 09/13/23 1447         Component Value Date/Time   SDES  09/13/2023 1156    BLOOD LEFT ANTECUBITAL Performed at [facility], [address], [place], [place] [address]    SDES  09/13/2023 1156    BLOOD RIGHT HAND Performed at [facility], [address] [place], [place] [address]    [facility]  09/13/2023 1156    BOTTLES DRAWN  AEROBIC AND ANAEROBIC Blood Culture adequate volume Performed at [facility], [address] [place], [place] [address]    [facility]  09/13/2023 1156    BOTTLES DRAWN AEROBIC AND ANAEROBIC Blood Culture results may not be optimal due to an inadequate volume of blood received in culture bottles Performed at [facility], [address], [place], [place] [address]    CULT  09/13/2023 1156    NO GROWTH 2 DAYS Performed at [facility], [address] [place], [place] [address]    CULT  09/13/2023 1156    NO GROWTH 2 DAYS Performed at [facility], [address], [place], [place] [address]    REPTSTATUS PENDING 09/13/2023 1156   REPTSTATUS PENDING 09/13/2023 1156     Radiology Studies: DG Tibia/Fibula Left Result Date: 09/13/2023 CLINICAL DATA:  Left lower leg laceration 2 weeks ago, purulent drainage and swelling EXAM: LEFT TIBIA AND FIBULA - 2 VIEW COMPARISON:  02/12/2022 FINDINGS: Frontal and lateral views of the left tibia and fibula are obtained. There are no acute or destructive bony abnormalities. Alignment of the left knee and ankle is anatomic. Diffuse subcutaneous edema. IMPRESSION: 1. Diffuse subcutaneous edema which could reflect cellulitis given history of laceration. 2. No acute or destructive bony abnormalities. Electronically Signed   By: Sharlet Salina M.D.   On: 09/13/2023 12:52   US Venous Img Lower Unilateral Left Result Date: 09/13/2023 CLINICAL DATA:  Lower extremity edema and the calf wound after cutting it on a dock while getting out of a boat 9 days previously. EXAM: LEFT LOWER EXTREMITY VENOUS DOPPLER ULTRASOUND TECHNIQUE: Gray-scale sonography with compression,  as well as color and duplex ultrasound, were performed to evaluate the deep venous system(s) from the level of the common femoral vein through the popliteal and proximal calf veins. COMPARISON:  None Available. FINDINGS: VENOUS Normal compressibility of the common femoral,  superficial femoral, and popliteal veins, as well as the visualized calf veins. Visualized portions of profunda femoral vein and great saphenous vein unremarkable. No filling defects to suggest DVT on grayscale or color Doppler imaging. Doppler waveforms show normal direction of venous flow, normal respiratory plasticity and response to augmentation. Limited views of the contralateral common femoral vein are unremarkable. OTHER None. Limitations: none IMPRESSION: Negative. Electronically Signed   By: Malachy Moan M.D.   On: 09/13/2023 12:21     LOS: 2 days   Total time spent in review of labs and imaging, patient evaluation, formulation of plan, documentation and communication with family: 35 minutes  Lanae Boast, MD  [facility]  09/15/2023, 11:39 AM

## 2023-09-15 NOTE — Plan of Care (Signed)

## 2023-09-16 ENCOUNTER — Encounter: Payer: Self-pay | Admitting: Hematology & Oncology

## 2023-09-16 ENCOUNTER — Telehealth (HOSPITAL_COMMUNITY): Payer: Self-pay

## 2023-09-16 ENCOUNTER — Other Ambulatory Visit (HOSPITAL_COMMUNITY): Payer: Self-pay

## 2023-09-16 DIAGNOSIS — S81812A Laceration without foreign body, left lower leg, initial encounter: Secondary | ICD-10-CM | POA: Diagnosis not present

## 2023-09-16 DIAGNOSIS — L03116 Cellulitis of left lower limb: Secondary | ICD-10-CM | POA: Diagnosis not present

## 2023-09-16 DIAGNOSIS — S81802A Unspecified open wound, left lower leg, initial encounter: Secondary | ICD-10-CM | POA: Diagnosis not present

## 2023-09-16 DIAGNOSIS — Z77111 Contact with and (suspected) exposure to water pollution: Secondary | ICD-10-CM | POA: Diagnosis not present

## 2023-09-16 DIAGNOSIS — B348 Other viral infections of unspecified site: Secondary | ICD-10-CM

## 2023-09-16 LAB — BASIC METABOLIC PANEL
Anion gap: 6 (ref 5–15)
BUN: 12 mg/dL (ref 8–23)
CO2: 20 mmol/L — ABNORMAL LOW (ref 22–32)
Calcium: 8.6 mg/dL — ABNORMAL LOW (ref 8.9–10.3)
Chloride: 112 mmol/L — ABNORMAL HIGH (ref 98–111)
Creatinine, Ser: 0.87 mg/dL (ref 0.61–1.24)
GFR, Estimated: >60 mL/min (ref >60)
Glucose, Bld: 89 mg/dL (ref 70–99)
Potassium: 4.1 mmol/L (ref 3.5–5.1)
Sodium: 138 mmol/L (ref 135–145)

## 2023-09-16 LAB — CBC
HCT: 40.7 % (ref 39.0–52.0)
Hemoglobin: 13.3 g/dL (ref 13.0–17.0)
MCH: 35.4 pg — ABNORMAL HIGH (ref 26.0–34.0)
MCHC: 32.7 g/dL (ref 30.0–36.0)
MCV: 108.2 fL — ABNORMAL HIGH (ref 80.0–100.0)
Platelets: 178 10*3/uL (ref 150–400)
RBC: 3.76 MIL/uL — ABNORMAL LOW (ref 4.22–5.81)
RDW: 14 % (ref 11.5–15.5)
WBC: 4.1 10*3/uL (ref 4.0–10.5)
nRBC: 0 % (ref 0.0–0.2)

## 2023-09-16 MED ORDER — LINEZOLID 600 MG PO TABS
600.0000 mg | ORAL_TABLET | Freq: Two times a day (BID) | ORAL | Status: DC
  Filled 2023-09-16: qty 1

## 2023-09-16 MED ORDER — LINEZOLID 600 MG PO TABS
600.0000 mg | ORAL_TABLET | Freq: Two times a day (BID) | ORAL | 0 refills | Status: AC
  Filled 2023-09-16: qty 21, 11d supply, fill #0

## 2023-09-16 MED ORDER — CIPROFLOXACIN HCL 750 MG PO TABS
750.0000 mg | ORAL_TABLET | Freq: Two times a day (BID) | ORAL | 0 refills | Status: AC
Start: 2023-09-16 — End: 2023-09-27
  Filled 2023-09-16: qty 22, 11d supply, fill #0

## 2023-09-16 MED ORDER — SACCHAROMYCES BOULARDII 250 MG PO CAPS
250.0000 mg | ORAL_CAPSULE | Freq: Two times a day (BID) | ORAL | 0 refills | Status: AC
  Filled 2023-09-16: qty 20, 10d supply, fill #0

## 2023-09-16 MED ORDER — SACCHAROMYCES BOULARDII 250 MG PO CAPS
250.0000 mg | ORAL_CAPSULE | Freq: Two times a day (BID) | ORAL | Status: DC
  Administered 2023-09-16: 250 mg via ORAL
  Filled 2023-09-16: qty 1

## 2023-09-16 MED ORDER — CIPROFLOXACIN HCL 500 MG PO TABS
750.0000 mg | ORAL_TABLET | Freq: Two times a day (BID) | ORAL | Status: DC
  Administered 2023-09-16: 750 mg via ORAL
  Filled 2023-09-16: qty 2

## 2023-09-16 MED ORDER — LOPERAMIDE HCL 2 MG PO CAPS: 2.0000 mg | ORAL_CAPSULE | ORAL | Status: DC | PRN

## 2023-09-16 NOTE — Discharge Summary (Signed)
 Physician Discharge Summary   Patient: Jorge [name] MRN: [id number] DOB: 09/20/1955  Admit date:     09/13/2023  Discharge date: 09/20/23  Discharge Physician: Marinda Elk   PCP: Esperanza Richters, PA-C   Recommendations at discharge:   Please take all prescribed medications exactly as instructed including the remaining course of your antibiotics Linezolid and Ciprofloxacin. Please consume a low sodium diet. Please increase your physical activity as tolerated.  Please do not participate in strenuous physical activity until cleared by your outpatient provider. Please perform daily dressing changes as instructed:   Clean left lower leg wound with soap and water, apply Xeroform gauze to area of eschar, cover with silicone foam or ABD pad and Kerlix roll gauze and tape whichever is preferred. Please keep your left leg elevated whenever lying down until otherwise instructed by your outpatient provider. Please maintain all outpatient follow-up appointments including follow-up with your primary care provider. Please return to the emergency department if you develop worsening pain, fevers in excess of 100.4 F, weakness or inability to tolerate oral intake.  Discharge Diagnoses: Principal Problem:   Cellulitis, leg  Resolved Problems:   * No resolved hospital problems. *   Hospital Course: 68 y.o. male with medical history significant for multiple myeloma in remission, hypertension  who had a left leg boat injury with leg laceration 2 wks ago in [place] needing 23 sutures and had infection and was admitted for 3 days and discharged.  The patient will back to the [facility] region and now presented to [facility]  09/14/23 w/ yellow drainage, worsening swelling small area of redness concerning for worsening infection despite being on Cipro and Bactrim.  Upon admission to the hospital the patient was placed on intravenous antibiotic therapy.  Initially, the patient was treated with  intravenous Levaquin and Rocephin.  Dr. Elinor Parkinson with Infectious disease was consulted and with their guidance antibiotics were eventually switched to doxycycline and cefepime.  During the hospitalization Dr. Myna Hidalgo with oncology was additionally consulted.  Patient clinically improved in the next several days.  A duplex of the leg was performed revealing no evidence of DVT.  Prior to discharge, the patient was transition to a home-going regimen of oral linezolid and ciprofloxacin.  The plan is for the patient to continue this regimen for total of 14 days of therapy until 09/27/2023.  Local wound care was performed with the assistance of our wound care team.  Patient clinically improved and was discharged home in improved and stable condition on 09/16/2023.  Patient was educated on appropriate wound care at time of discharge.     Pain control - [employer] Controlled Substance Reporting System database was reviewed. and patient was instructed, not to drive, operate heavy machinery, perform activities at heights, swimming or participation in water activities or provide baby-sitting services while on Pain, Sleep and Anxiety Medications; until their outpatient Physician has advised to do so again. Also recommended to not to take more than prescribed Pain, Sleep and Anxiety Medications.   Consultants: Dr. Myna Hidalgo with Oncology, Dr. Elinor Parkinson with Infectious Disesae Procedures performed: none  Disposition: Home Diet recommendation:  Regular diet  DISCHARGE MEDICATION: Allergies as of 09/16/2023       Reactions   Crestor [rosuvastatin Calcium] Other (See Comments)   Achilles tendonitis   Singulair [montelukast] Other (See Comments)   Mood change        Medication List     PAUSE taking these medications    lenalidomide 5 MG capsule Wait  to take this until your doctor or other care provider tells you to start again. Commonly known as: REVLIMID TAKE 1 CAPSULE BY MOUTH DAILY  FOR 21 DAYS,  THEN 7 DAYS OFF Auth# [id number]       TAKE these medications    allopurinol 100 MG tablet Commonly known as: ZYLOPRIM TAKE 1 TABLET BY MOUTH EVERY DAY   amLODipine 5 MG tablet Commonly known as: NORVASC TAKE 1 TABLET (5 MG TOTAL) BY MOUTH DAILY.   ciprofloxacin 750 MG tablet Commonly known as: CIPRO Take 1 tablet (750 mg total) by mouth 2 (two) times daily for 22 doses.   colchicine 0.6 MG tablet Take 1 tablet (0.6 mg total) by mouth 2 (two) times daily as needed (when having a gout flare).   FISH OIL HIGH POTENCY PO Take by mouth daily.   Florastor 250 MG capsule Generic drug: saccharomyces boulardii Take 1 capsule (250 mg total) by mouth 2 (two) times daily for 12 days.   fluorouracil 5 % cream Commonly known as: EFUDEX Apply 1 Application topically 2 (two) times daily as needed (For rash on head).   gabapentin 300 MG capsule Commonly known as: NEURONTIN Take 1 capsule (300 mg total) by mouth 2 (two) times daily.   HYDROcodone-acetaminophen 5-325 MG tablet Commonly known as: NORCO/VICODIN Take 1 tablet by mouth 3 (three) times daily as needed for severe pain (pain score 7-10).   ipratropium 0.03 % nasal spray Commonly known as: ATROVENT PLACE 1-2 SPRAYS INTO BOTH NOSTRILS 2 (TWO) TIMES DAILY AS NEEDED (NASAL DRAINAGE).   linezolid 600 MG tablet Commonly known as: ZYVOX Take 1 tablet (600 mg total) by mouth every 12 (twelve) hours for 21 doses.   loperamide 2 MG capsule Commonly known as: IMODIUM Take 2 mg by mouth as needed for diarrhea or loose stools.   valsartan 160 MG tablet Commonly known as: DIOVAN TAKE 1 TABLET BY MOUTH EVERY DAY   Vitamin D3 250 MCG (10000 UT) capsule Take 10,000 Units by mouth daily.   Xarelto 10 MG Tabs tablet Generic drug: rivaroxaban TAKE 1 TABLET BY MOUTH EVERY DAY   zolpidem 5 MG tablet Commonly known as: AMBIEN TAKE 1 TABLET BY MOUTH EVERY DAY AT BEDTIME AS NEEDED FOR SLEEP   ZOMETA IV Inject into the vein every 3  (three) months.               Discharge Care Instructions  (From admission, onward)           Start     Ordered   09/16/23 0000  Discharge wound care:       Comments: Clean left lower leg wound with soap and water, apply Xeroform gauze to area of eschar, cover with silicone foam or ABD pad and Kerlix roll gauze and tape whichever is preferred.   09/16/23 1504            Follow-up Information     Saguier, Ramon Dredge, PA-C. Schedule an appointment as soon as possible for a visit in 1 week(s).   Specialties: Internal Medicine, Family Medicine Contact information: [address] [place] [address] [phone]         Josph Macho, MD Follow up.   Specialty: Oncology Contact information: [address] [place] [address] [phone]                 Discharge Exam: Ceasar Mons Weights   09/13/23 1117 09/13/23 1533 09/13/23 1600  Weight:  81.6 kg 84.4 kg 84.4 kg    Constitutional: Awake alert and oriented x3, no associated distress.   Respiratory: clear to auscultation bilaterally, no wheezing, no crackles. Normal respiratory effort. No accessory muscle use.  Cardiovascular: Regular rate and rhythm, no murmurs / rubs / gallops. No extremity edema. 2+ pedal pulses. No carotid bruits.  Abdomen: Abdomen is soft and nontender.  No evidence of intra-abdominal masses.  Positive bowel sounds noted in all quadrants.   Musculoskeletal: Dressing of the left lower extremity is clean dry and intact.    Condition at discharge: fair  The results of significant diagnostics from this hospitalization (including imaging, microbiology, ancillary and laboratory) are listed below for reference.   Imaging Studies: DG Tibia/Fibula Left Result Date: 09/13/2023 CLINICAL DATA:  Left lower leg laceration 2 weeks ago, purulent drainage and swelling EXAM: LEFT TIBIA AND FIBULA - 2 VIEW COMPARISON:  02/12/2022 FINDINGS: Frontal and lateral views of  the left tibia and fibula are obtained. There are no acute or destructive bony abnormalities. Alignment of the left knee and ankle is anatomic. Diffuse subcutaneous edema. IMPRESSION: 1. Diffuse subcutaneous edema which could reflect cellulitis given history of laceration. 2. No acute or destructive bony abnormalities. Electronically Signed   By: Sharlet Salina M.D.   On: 09/13/2023 12:52   US Venous Img Lower Unilateral Left Result Date: 09/13/2023 CLINICAL DATA:  Lower extremity edema and the calf wound after cutting it on a dock while getting out of a boat 9 days previously. EXAM: LEFT LOWER EXTREMITY VENOUS DOPPLER ULTRASOUND TECHNIQUE: Gray-scale sonography with compression, as well as color and duplex ultrasound, were performed to evaluate the deep venous system(s) from the level of the common femoral vein through the popliteal and proximal calf veins. COMPARISON:  None Available. FINDINGS: VENOUS Normal compressibility of the common femoral, superficial femoral, and popliteal veins, as well as the visualized calf veins. Visualized portions of profunda femoral vein and great saphenous vein unremarkable. No filling defects to suggest DVT on grayscale or color Doppler imaging. Doppler waveforms show normal direction of venous flow, normal respiratory plasticity and response to augmentation. Limited views of the contralateral common femoral vein are unremarkable. OTHER None. Limitations: none IMPRESSION: Negative. Electronically Signed   By: Malachy Moan M.D.   On: 09/13/2023 12:21    Microbiology: Results for orders placed or performed during the hospital encounter of 09/13/23  Culture, blood (routine x 2)     Status: None   Collection Time: 09/13/23 11:56 AM   Specimen: BLOOD  Result Value Ref Range Status   Specimen Description   Final    BLOOD LEFT ANTECUBITAL Performed at [facility], [address] [place], [place] [address]    Special Requests   Final    BOTTLES DRAWN  AEROBIC AND ANAEROBIC Blood Culture adequate volume Performed at [facility], [address] [place], [place] [address]    Culture   Final    NO GROWTH 5 DAYS Performed at [facility], [address], [place], [place] [address]    Report Status 09/18/2023 FINAL  Final  Culture, blood (routine x 2)     Status: None   Collection Time: 09/13/23 11:56 AM   Specimen: BLOOD RIGHT HAND  Result Value Ref Range Status   Specimen Description   Final    BLOOD RIGHT HAND Performed at [facility], [address] [place], [place] [address]    Special Requests  Final    BOTTLES DRAWN AEROBIC AND ANAEROBIC Blood Culture results may not be optimal due to an inadequate volume of blood received in culture bottles Performed at [facility], [address] [place], [place] [address]    Culture   Final    NO GROWTH 5 DAYS Performed at [facility], [address], [place], [place] [address]    Report Status 09/18/2023 FINAL  Final   *Note: Due to a large number of results and/or encounters for the requested time period, some results have not been displayed. A complete set of results can be found in Results Review.    Labs: CBC: Recent Labs  Lab 09/13/23 1157 09/14/23 0512 09/15/23 0518 09/16/23 0507  WBC 3.8* 4.2 3.7* 4.1  NEUTROABS 1.7  --   --   --   HGB 13.7 12.9* 13.3 13.3  HCT 40.5 38.2* 39.3 40.7  MCV 103.6* 105.5* 106.8* 108.2*  PLT 185 172 187 178   Basic Metabolic Panel: Recent Labs  Lab 09/13/23 1157 09/14/23 0512 09/15/23 0518 09/16/23 0507  NA 137 138 138 138  K 3.8 4.0 4.5 4.1  CL 104 108 106 112*  CO2 23 23 24  20*  GLUCOSE 86 86 95 89  BUN 14 13 13 12   CREATININE 1.13 1.08 1.12 0.87  CALCIUM 8.9 8.5* 8.6* 8.6*   Liver Function Tests: No results for input(s): "AST", "ALT", "ALKPHOS", "BILITOT", "PROT", "ALBUMIN" in the last 168 hours. CBG: No results for input(s): "GLUCAP" in the last 168 hours.  Discharge  time spent: greater than 30 minutes.  Signed: Marinda Elk, MD [facility] 09/20/2023

## 2023-09-16 NOTE — Progress Notes (Signed)
 Hopefully, Jorge [name] will go home today.  He feels okay.  He is out of bed.  He says his left lower leg is feeling better.  He says there is a little bit of pruritus with it now.  I think this is a good indication for healing.  He is still on IV antibiotics.  Cultures are all negative.  His labs show white cell count 4.1.  Hemoglobin 13.3.  Platelet count 178,000.  He has had no nausea or vomiting.  He has had diarrhea.  I would get him on a probiotic.  He has had no fever.  He has had no mouth sores.  He has had no bleeding.  His vital signs are temperature 97.7.  Pulse 62.  Blood pressure 133/82.  His lungs are clear bilaterally.  Cardiac exam regular rate and rhythm.  Abdomen is soft.  Bowel sounds are present.  There is no fluid wave.  Extremity shows the left lower leg to be dressed.  I do not see any erythema.  There is no swelling.  He has good pulses.  Neurological exam is nonfocal.  For right now, I think that he should be able to go home.  He may need to be on oral antibiotics.  I do think that we have to be careful with him developing C. difficile.  I know he has had great care from everybody on 3 E.   Christin Bach, MD  [place] 12:12
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
## 2023-09-16 NOTE — Plan of Care (Signed)

## 2023-09-16 NOTE — TOC Progression Note (Signed)
 Transition of Care [facility]) - Progression Note    Patient Details  Name: Jorge [name] MRN: [id number] Date of Birth: 15-Dec-1955  Transition of [facility]) CM/SW Contact  Howell Rucks, RN Phone Number: 09/16/2023, 3:18 PM  Clinical Narrative:  Pt's spouse to be taught wound care. Referral to [facility]. TOC will continue to follow.            Expected Discharge Plan and Services         Expected Discharge Date: 09/16/23                                     Social Determinants of Health (SDOH) Interventions SDOH Screenings   Food Insecurity: No Food Insecurity (09/13/2023)  Housing: Low Risk  (09/13/2023)  Transportation Needs: No Transportation Needs (09/13/2023)  Utilities: Not At Risk (09/13/2023)  Alcohol Screen: Low Risk  (08/21/2022)  Depression (PHQ2-9): Low Risk  (08/21/2022)  Financial Resource Strain: Low Risk  (08/21/2022)  Physical Activity: Sufficiently Active (07/02/2021)  Social Connections: Socially Integrated (09/13/2023)  Stress: No Stress Concern Present (08/21/2022)  Tobacco Use: Medium Risk (09/13/2023)    Readmission Risk Interventions    09/14/2023    2:31 PM  Readmission Risk Prevention Plan  Post Dischage Appt Complete  Medication Screening Complete  Transportation Screening Complete

## 2023-09-16 NOTE — Progress Notes (Deleted)
 Mobility Specialist - Progress Note   09/16/23 0910  Mobility  Activity Ambulated with assistance in hallway  Level of Assistance Modified independent, requires aide device or extra time  Assistive Device Other (Comment) (IV Pole)  Distance Ambulated (ft) 250 ft  Activity Response Tolerated well  Mobility Referral Yes  Mobility visit 1 Mobility  Mobility Specialist Start Time (ACUTE ONLY) 0859  Mobility Specialist Stop Time (ACUTE ONLY) 0909  Mobility Specialist Time Calculation (min) (ACUTE ONLY) 10 min   Pt received in bed and agreeable to mobility. Distance limited d/t fatigue. No complaints during session. Pt to bed after session with all needs met.    [facility]

## 2023-09-16 NOTE — Telephone Encounter (Signed)
 Pharmacy Patient Advocate Encounter  Insurance verification completed.    The patient is insured through [facility] MEDICAID.     Ran test claim for Ciprofloxacin 750 and it is refill too soon until 09/18/23. Ran test claim for Linezolid 600 and pt copay is $0.00   This test claim was processed through [facility]- copay amounts may vary at other pharmacies due to pharmacy/plan contracts, or as the patient moves through the different stages of their insurance plan.

## 2023-09-16 NOTE — Discharge Instructions (Addendum)
 Please take all prescribed medications exactly as instructed including the remaining course of your antibiotics Linezolid and Ciprofloxacin. Please consume a low sodium diet. Please increase your physical activity as tolerated.  Please do not participate in strenuous physical activity until cleared by your outpatient provider. Please perform daily dressing changes as instructed:   Clean left lower leg wound with soap and water, apply Xeroform gauze to area of eschar, cover with silicone foam or ABD pad and Kerlix roll gauze and tape whichever is preferred. Please keep your left leg elevated whenever lying down until otherwise instructed by your outpatient provider. Please maintain all outpatient follow-up appointments including follow-up with your primary care provider. Please return to the emergency department if you develop worsening pain, fevers in excess of 100.4 F, weakness or inability to tolerate oral intake.

## 2023-09-17 ENCOUNTER — Encounter (HOSPITAL_BASED_OUTPATIENT_CLINIC_OR_DEPARTMENT_OTHER): Attending: Internal Medicine | Admitting: Internal Medicine

## 2023-09-17 ENCOUNTER — Telehealth: Payer: Self-pay

## 2023-09-17 DIAGNOSIS — S81812D Laceration without foreign body, left lower leg, subsequent encounter: Secondary | ICD-10-CM | POA: Insufficient documentation

## 2023-09-17 DIAGNOSIS — L03116 Cellulitis of left lower limb: Secondary | ICD-10-CM | POA: Diagnosis not present

## 2023-09-17 DIAGNOSIS — L97828 Non-pressure chronic ulcer of other part of left lower leg with other specified severity: Secondary | ICD-10-CM | POA: Insufficient documentation

## 2023-09-17 NOTE — Transitions of Care (Post Inpatient/ED Visit) (Signed)
 09/17/2023  Name: Jorge [name] MRN: [id number] DOB: Jun 18, 1956  Today's TOC FU Call Status: Today's TOC FU Call Status:: Successful TOC FU Call Completed TOC FU Call Complete Date: 09/17/23 Patient's Name and Date of Birth confirmed.  Transition Care Management Follow-up Telephone Call Date of Discharge: 09/16/23 Discharge Facility: [facility]) Type of Discharge: Inpatient Admission Primary Inpatient Discharge Diagnosis:: cellulitis How have you been since you were released from the hospital?: Better Any questions or concerns?: No  Items Reviewed: Did you receive and understand the discharge instructions provided?: Yes Medications obtained,verified, and reconciled?: Yes (Medications Reviewed) Any new allergies since your discharge?: No Dietary orders reviewed?: Yes Do you have support at home?: Yes People in Home: spouse  Medications Reviewed Today: Medications Reviewed Today     Reviewed by Karena Addison, LPN (Licensed Practical Nurse) on 09/17/23 at 0930  Med List Status: <None>   Medication Order Taking? Sig Documenting Provider Last Dose Status Informant  allopurinol (ZYLOPRIM) 100 MG tablet [id number] No TAKE 1 TABLET BY MOUTH EVERY DAY Marisue [place] 09/12/2023 Bedtime Active Self  amLODipine (NORVASC) 5 MG tablet [id number] No TAKE 1 TABLET (5 MG TOTAL) BY MOUTH DAILY. Esperanza Richters, PA-C 09/13/2023 Morning Active Self  Cholecalciferol (VITAMIN D3) 250 MCG (10000 UT) capsule [id number] No Take 10,000 Units by mouth daily. [provider] Past Week Active Self  ciprofloxacin (CIPRO) 750 MG tablet [id number]  Take 1 tablet (750 mg total) by mouth 2 (two) times daily for 22 doses. Shalhoub, Deno Lunger, MD  Active   colchicine 0.6 MG tablet [id number] No Take 1 tablet (0.6 mg total) by mouth 2 (two) times daily as needed (when having a gout flare). Saguier, Ramon Dredge, PA-C Past Month Active Self  fluorouracil (EFUDEX) 5 % cream [id number] No Apply 1 Application  topically 2 (two) times daily as needed (For rash on head). [provider] 08/23/2023 Active Self  gabapentin (NEURONTIN) 300 MG capsule [id number] No Take 1 capsule (300 mg total) by mouth 2 (two) times daily. Esperanza Richters, PA-C 09/13/2023 Morning Active Self  HYDROcodone-acetaminophen (NORCO/VICODIN) 5-325 MG tablet [id number] No Take 1 tablet by mouth 3 (three) times daily as needed for severe pain (pain score 7-10). [provider] 09/12/2023 Morning Active Self  ipratropium (ATROVENT) 0.03 % nasal spray [id number] No PLACE 1-2 SPRAYS INTO BOTH NOSTRILS 2 (TWO) TIMES DAILY AS NEEDED (NASAL DRAINAGE). Ellamae Sia, DO 09/13/2023 Active Self  lenalidomide (REVLIMID) 5 MG capsule [id number]  TAKE 1 CAPSULE BY MOUTH DAILY  FOR 21 DAYS, THEN 7 DAYS OFF Auth# [id number] Josph Macho, MD  Active Self           Med Note Jomarie Longs, [facility]   Sun Sep 13, 2023  4:57 PM) On hold  linezolid (ZYVOX) 600 MG tablet [id number]  Take 1 tablet (600 mg total) by mouth every 12 (twelve) hours for 21 doses. Shalhoub, Deno Lunger, MD  Active   loperamide (IMODIUM) 2 MG capsule [id number] No Take 2 mg by mouth as needed for diarrhea or loose stools. [provider] 09/12/2023 Morning Active Self           Med Note Jomarie Longs, [facility]EENA   Sun Sep 13, 2023  4:57 PM)    Omega-3 Fatty Acids (FISH OIL HIGH POTENCY PO) [id number] No Take by mouth daily. [provider] Past Week Active Self  saccharomyces boulardii (FLORASTOR) 250 MG capsule [id number]  Take 1 capsule (250 mg total) by mouth 2 (two) times daily for 12 days. Shalhoub,  Deno Lunger, MD  Active   valsartan (DIOVAN) 160 MG tablet [id number] No TAKE 1 TABLET BY MOUTH EVERY DAY Leslye Peer, MD 09/13/2023 Morning Active Self  XARELTO 10 MG TABS tablet [id number] No TAKE 1 TABLET BY MOUTH EVERY DAY Josph Macho, MD 09/12/2023  7:00 PM Active Self  Zoledronic Acid (ZOMETA IV) [id number] No Inject into the vein every 3 (three) months. [provider]  08/28/2023 Active Self  zolpidem (AMBIEN) 5 MG tablet [id number] No TAKE 1 TABLET BY MOUTH EVERY DAY AT BEDTIME AS NEEDED FOR SLEEP Saguier, Kateri Mc 09/11/2023 Active Self            Home Care and Equipment/Supplies: Were Home Health Services Ordered?: NA Any new equipment or medical supplies ordered?: NA  Functional Questionnaire: Do you need assistance with bathing/showering or dressing?: No Do you need assistance with meal preparation?: No Do you need assistance with eating?: No Do you have difficulty maintaining continence: No Do you need assistance with getting out of bed/getting out of a chair/moving?: No Do you have difficulty managing or taking your medications?: No  Follow up appointments reviewed: PCP Follow-up appointment confirmed?: Yes Date of PCP follow-up appointment?: 09/29/23 Follow-up Provider: [facility] Follow-up appointment confirmed?: Yes Date of Specialist follow-up appointment?: 09/17/23 Follow-Up Specialty Provider:: Wound care Do you need transportation to your follow-up appointment?: No Do you understand care options if your condition(s) worsen?: Yes-patient verbalized understanding    SIGNATURE Karena Addison, LPN [facility] Nurse Health Advisor Direct Dial [phone]

## 2023-09-18 LAB — CULTURE, BLOOD (ROUTINE X 2)
Culture: NO GROWTH
Culture: NO GROWTH
Special Requests: ADEQUATE

## 2023-09-21 ENCOUNTER — Inpatient Hospital Stay

## 2023-09-21 ENCOUNTER — Encounter: Payer: Self-pay | Admitting: Hematology & Oncology

## 2023-09-21 ENCOUNTER — Other Ambulatory Visit: Payer: Self-pay

## 2023-09-21 ENCOUNTER — Inpatient Hospital Stay (HOSPITAL_BASED_OUTPATIENT_CLINIC_OR_DEPARTMENT_OTHER): Admitting: Hematology & Oncology

## 2023-09-21 ENCOUNTER — Inpatient Hospital Stay: Attending: Hematology & Oncology

## 2023-09-21 ENCOUNTER — Other Ambulatory Visit (HOSPITAL_COMMUNITY): Payer: Self-pay

## 2023-09-21 VITALS — BP 110/73 | HR 74 | Temp 97.8°F | Resp 18 | Ht 67.0 in | Wt 178.0 lb

## 2023-09-21 DIAGNOSIS — C9001 Multiple myeloma in remission: Secondary | ICD-10-CM

## 2023-09-21 DIAGNOSIS — Z7901 Long term (current) use of anticoagulants: Secondary | ICD-10-CM | POA: Diagnosis not present

## 2023-09-21 DIAGNOSIS — C9000 Multiple myeloma not having achieved remission: Secondary | ICD-10-CM | POA: Diagnosis present

## 2023-09-21 DIAGNOSIS — Z86718 Personal history of other venous thrombosis and embolism: Secondary | ICD-10-CM | POA: Diagnosis not present

## 2023-09-21 DIAGNOSIS — Z79899 Other long term (current) drug therapy: Secondary | ICD-10-CM | POA: Diagnosis not present

## 2023-09-21 DIAGNOSIS — Z9484 Stem cells transplant status: Secondary | ICD-10-CM | POA: Insufficient documentation

## 2023-09-21 DIAGNOSIS — L03116 Cellulitis of left lower limb: Secondary | ICD-10-CM

## 2023-09-21 DIAGNOSIS — D509 Iron deficiency anemia, unspecified: Secondary | ICD-10-CM | POA: Insufficient documentation

## 2023-09-21 LAB — CMP (CANCER CENTER ONLY)
ALT: 18 U/L (ref 0–44)
AST: 18 U/L (ref 15–41)
Albumin: 4.4 g/dL (ref 3.5–5.0)
Alkaline Phosphatase: 43 U/L (ref 38–126)
Anion gap: 10 (ref 5–15)
BUN: 19 mg/dL (ref 8–23)
CO2: 24 mmol/L (ref 22–32)
Calcium: 9.4 mg/dL (ref 8.9–10.3)
Chloride: 104 mmol/L (ref 98–111)
Creatinine: 1.01 mg/dL (ref 0.61–1.24)
GFR, Estimated: >60 mL/min (ref >60)
Glucose, Bld: 90 mg/dL (ref 70–99)
Potassium: 4.5 mmol/L (ref 3.5–5.1)
Sodium: 138 mmol/L (ref 135–145)
Total Bilirubin: 1.2 mg/dL (ref 0.0–1.2)
Total Protein: 7.3 g/dL (ref 6.5–8.1)

## 2023-09-21 LAB — CBC WITH DIFFERENTIAL (CANCER CENTER ONLY)
Abs Immature Granulocytes: 0.02 10*3/uL (ref 0.00–0.07)
Basophils Absolute: 0 10*3/uL (ref 0.0–0.1)
Basophils Relative: 1 %
Eosinophils Absolute: 0.1 10*3/uL (ref 0.0–0.5)
Eosinophils Relative: 1 %
HCT: 43.1 % (ref 39.0–52.0)
Hemoglobin: 15.1 g/dL (ref 13.0–17.0)
Immature Granulocytes: 0 %
Lymphocytes Relative: 55 %
Lymphs Abs: 2.8 10*3/uL (ref 0.7–4.0)
MCH: 35.5 pg — ABNORMAL HIGH (ref 26.0–34.0)
MCHC: 35 g/dL (ref 30.0–36.0)
MCV: 101.4 fL — ABNORMAL HIGH (ref 80.0–100.0)
Monocytes Absolute: 0.4 10*3/uL (ref 0.1–1.0)
Monocytes Relative: 7 %
Neutro Abs: 1.9 10*3/uL (ref 1.7–7.7)
Neutrophils Relative %: 36 %
Platelet Count: 180 10*3/uL (ref 150–400)
RBC: 4.25 MIL/uL (ref 4.22–5.81)
RDW: 13.4 % (ref 11.5–15.5)
WBC Count: 5.2 10*3/uL (ref 4.0–10.5)
nRBC: 0 % (ref 0.0–0.2)

## 2023-09-21 LAB — FERRITIN: Ferritin: 169 ng/mL (ref 24–336)

## 2023-09-21 LAB — IRON AND IRON BINDING CAPACITY (CC-WL,HP ONLY)
Iron: 379 ug/dL — ABNORMAL HIGH (ref 45–182)
Saturation Ratios: 95 % — ABNORMAL HIGH (ref 17.9–39.5)
TIBC: 399 ug/dL (ref 250–450)
UIBC: 20 ug/dL — ABNORMAL LOW (ref 117–376)

## 2023-09-21 LAB — LACTATE DEHYDROGENASE: LDH: 154 U/L (ref 98–192)
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
## 2023-09-22 LAB — KAPPA/LAMBDA LIGHT CHAINS
Kappa free light chain: 26.8 mg/L — ABNORMAL HIGH (ref 3.3–19.4)
Kappa, lambda light chain ratio: 1.97 — ABNORMAL HIGH (ref 0.26–1.65)
Lambda free light chains: 13.6 mg/L (ref 5.7–26.3)

## 2023-09-22 LAB — IGG, IGA, IGM
IgA: 413 mg/dL (ref 61–437)
IgG (Immunoglobin G), Serum: 1152 mg/dL (ref 603–1613)
IgM (Immunoglobulin M), Srm: 51 mg/dL (ref 20–172)

## 2023-09-24 ENCOUNTER — Encounter (HOSPITAL_BASED_OUTPATIENT_CLINIC_OR_DEPARTMENT_OTHER): Admitting: Internal Medicine

## 2023-09-24 DIAGNOSIS — L97828 Non-pressure chronic ulcer of other part of left lower leg with other specified severity: Secondary | ICD-10-CM | POA: Diagnosis not present

## 2023-09-29 ENCOUNTER — Inpatient Hospital Stay: Admitting: Medical

## 2023-09-29 ENCOUNTER — Ambulatory Visit (INDEPENDENT_AMBULATORY_CARE_PROVIDER_SITE_OTHER): Admitting: Medical

## 2023-09-29 VITALS — BP 110/70 | HR 72 | Temp 98.2°F | Resp 18 | Ht 67.0 in | Wt 180.6 lb

## 2023-09-29 DIAGNOSIS — R197 Diarrhea, unspecified: Secondary | ICD-10-CM

## 2023-09-29 DIAGNOSIS — T148XXD Other injury of unspecified body region, subsequent encounter: Secondary | ICD-10-CM | POA: Diagnosis not present

## 2023-09-29 DIAGNOSIS — L089 Local infection of the skin and subcutaneous tissue, unspecified: Secondary | ICD-10-CM | POA: Diagnosis not present

## 2023-09-29 DIAGNOSIS — T148XXA Other injury of unspecified body region, initial encounter: Secondary | ICD-10-CM | POA: Diagnosis not present

## 2023-09-29 DIAGNOSIS — D696 Thrombocytopenia, unspecified: Secondary | ICD-10-CM | POA: Diagnosis not present

## 2023-09-29 NOTE — Patient Instructions (Signed)
 Pretibial laceration with secondary infection Sustained a pretibial laceration requiring 23 stitches. Developed secondary infection, treated with IV antibiotics. Wound filled 25-30%, no significant discharge. Completed linezolid and ciprofloxacin. Healing by secondary intention with ankle swelling. Thrombocytopenia likely secondary to linezolid. Wound culture planned. - Perform wound culture to check for residual infection. - Referred to wound care specialist; appointment scheduled for Thursday. -  CBC to monitor platelet count and wbc; schedule for Friday morning.  Thrombocytopenia Platelet count decreased to 77, likely due to linezolid.. Expected recovery in 1-2 weeks. Monitoring essential. - Schedule CBC for Friday morning to monitor platelet count. - Send CBC results to Dr. Myna Hidalgo for review.  Diarrhea Experienced diarrhea on antibiotics. Stools more formed but loose at times. Concern for C. difficile infection, though lacks typical symptoms. - Submit stool sample if diarrhea is watery to test for C. difficile.  General Health Maintenance Received Tdap booster in [place] due to wound contamination. - Ensure Tdap vaccination is up to date.  Follow-up Follow-up contingent on lab results and wound care evaluation. - Follow up with wound care specialist on Thursday. - Schedule follow-up appointment based on lab results and wound care evaluation.
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
## 2023-09-30 ENCOUNTER — Other Ambulatory Visit (INDEPENDENT_AMBULATORY_CARE_PROVIDER_SITE_OTHER)

## 2023-09-30 DIAGNOSIS — R197 Diarrhea, unspecified: Secondary | ICD-10-CM

## 2023-10-01 ENCOUNTER — Encounter (HOSPITAL_BASED_OUTPATIENT_CLINIC_OR_DEPARTMENT_OTHER): Admitting: General Surgery

## 2023-10-01 DIAGNOSIS — L97828 Non-pressure chronic ulcer of other part of left lower leg with other specified severity: Secondary | ICD-10-CM | POA: Diagnosis not present

## 2023-10-02 ENCOUNTER — Other Ambulatory Visit: Payer: Self-pay

## 2023-10-02 ENCOUNTER — Other Ambulatory Visit (INDEPENDENT_AMBULATORY_CARE_PROVIDER_SITE_OTHER)

## 2023-10-02 ENCOUNTER — Encounter: Payer: Self-pay | Admitting: Hematology & Oncology

## 2023-10-02 ENCOUNTER — Encounter: Payer: Self-pay | Admitting: Medical

## 2023-10-02 DIAGNOSIS — L089 Local infection of the skin and subcutaneous tissue, unspecified: Secondary | ICD-10-CM

## 2023-10-02 DIAGNOSIS — D696 Thrombocytopenia, unspecified: Secondary | ICD-10-CM | POA: Diagnosis not present

## 2023-10-02 DIAGNOSIS — T148XXA Other injury of unspecified body region, initial encounter: Secondary | ICD-10-CM

## 2023-10-02 DIAGNOSIS — C9001 Multiple myeloma in remission: Secondary | ICD-10-CM

## 2023-10-02 LAB — CBC WITH DIFFERENTIAL/PLATELET
Basophils Absolute: 0 10*3/uL (ref 0.0–0.1)
Basophils Relative: 0.3 % (ref 0.0–3.0)
Eosinophils Absolute: 0.1 10*3/uL (ref 0.0–0.7)
Eosinophils Relative: 2.6 % (ref 0.0–5.0)
HCT: 39.4 % (ref 39.0–52.0)
Hemoglobin: 13.3 g/dL (ref 13.0–17.0)
Lymphocytes Relative: 57.1 % — ABNORMAL HIGH (ref 12.0–46.0)
Lymphs Abs: 2.7 10*3/uL (ref 0.7–4.0)
MCHC: 33.8 g/dL (ref 30.0–36.0)
MCV: 104.7 fl — ABNORMAL HIGH (ref 78.0–100.0)
Monocytes Absolute: 0.5 10*3/uL (ref 0.1–1.0)
Monocytes Relative: 11.6 % (ref 3.0–12.0)
Neutro Abs: 1.3 10*3/uL — ABNORMAL LOW (ref 1.4–7.7)
Neutrophils Relative %: 28.4 % — ABNORMAL LOW (ref 43.0–77.0)
Platelets: 61 10*3/uL — ABNORMAL LOW (ref 150.0–400.0)
RBC: 3.76 Mil/uL — ABNORMAL LOW (ref 4.22–5.81)
RDW: 13.7 % (ref 11.5–15.5)
WBC: 4.7 10*3/uL (ref 4.0–10.5)

## 2023-10-02 LAB — CLOSTRIDIUM DIFFICILE BY PCR: Toxigenic C. Difficile by PCR: NEGATIVE

## 2023-10-03 ENCOUNTER — Encounter: Payer: Self-pay | Admitting: Medical

## 2023-10-03 LAB — WOUND CULTURE
MICRO NUMBER:: 16215301
RESULT:: NO GROWTH
SPECIMEN QUALITY:: ADEQUATE

## 2023-10-05 ENCOUNTER — Inpatient Hospital Stay (HOSPITAL_BASED_OUTPATIENT_CLINIC_OR_DEPARTMENT_OTHER): Admitting: Hematology & Oncology

## 2023-10-05 ENCOUNTER — Inpatient Hospital Stay

## 2023-10-05 ENCOUNTER — Encounter: Payer: Self-pay | Admitting: Hematology & Oncology

## 2023-10-05 ENCOUNTER — Other Ambulatory Visit: Payer: Self-pay

## 2023-10-05 VITALS — BP 114/76 | HR 67 | Temp 97.6°F | Resp 18 | Ht 67.0 in | Wt 186.0 lb

## 2023-10-05 DIAGNOSIS — C9001 Multiple myeloma in remission: Secondary | ICD-10-CM

## 2023-10-05 DIAGNOSIS — C9000 Multiple myeloma not having achieved remission: Secondary | ICD-10-CM

## 2023-10-05 LAB — CBC WITH DIFFERENTIAL (CANCER CENTER ONLY)
Abs Immature Granulocytes: 0.02 10*3/uL (ref 0.00–0.07)
Basophils Absolute: 0 10*3/uL (ref 0.0–0.1)
Basophils Relative: 0 %
Eosinophils Absolute: 0.1 10*3/uL (ref 0.0–0.5)
Eosinophils Relative: 3 %
HCT: 37 % — ABNORMAL LOW (ref 39.0–52.0)
Hemoglobin: 12.8 g/dL — ABNORMAL LOW (ref 13.0–17.0)
Immature Granulocytes: 0 %
Lymphocytes Relative: 57 %
Lymphs Abs: 2.6 10*3/uL (ref 0.7–4.0)
MCH: 35.4 pg — ABNORMAL HIGH (ref 26.0–34.0)
MCHC: 34.6 g/dL (ref 30.0–36.0)
MCV: 102.2 fL — ABNORMAL HIGH (ref 80.0–100.0)
Monocytes Absolute: 0.4 10*3/uL (ref 0.1–1.0)
Monocytes Relative: 9 %
Neutro Abs: 1.4 10*3/uL — ABNORMAL LOW (ref 1.7–7.7)
Neutrophils Relative %: 31 %
Platelet Count: 99 10*3/uL — ABNORMAL LOW (ref 150–400)
RBC: 3.62 MIL/uL — ABNORMAL LOW (ref 4.22–5.81)
RDW: 12.7 % (ref 11.5–15.5)
WBC Count: 4.5 10*3/uL (ref 4.0–10.5)
nRBC: 0 % (ref 0.0–0.2)

## 2023-10-05 LAB — CMP (CANCER CENTER ONLY)
ALT: 18 U/L (ref 0–44)
AST: 24 U/L (ref 15–41)
Albumin: 4.2 g/dL (ref 3.5–5.0)
Alkaline Phosphatase: 41 U/L (ref 38–126)
Anion gap: 7 (ref 5–15)
BUN: 23 mg/dL (ref 8–23)
CO2: 28 mmol/L (ref 22–32)
Calcium: 9.3 mg/dL (ref 8.9–10.3)
Chloride: 104 mmol/L (ref 98–111)
Creatinine: 0.92 mg/dL (ref 0.61–1.24)
GFR, Estimated: >60 mL/min (ref >60)
Glucose, Bld: 92 mg/dL (ref 70–99)
Potassium: 4.6 mmol/L (ref 3.5–5.1)
Sodium: 139 mmol/L (ref 135–145)
Total Bilirubin: 0.4 mg/dL (ref 0.0–1.2)
Total Protein: 6.5 g/dL (ref 6.5–8.1)

## 2023-10-05 LAB — SALMONELLA/SHIGELLA CULT, CAMPY EIA AND SHIGA TOXIN RFL ECOLI
MICRO NUMBER: 16220785
MICRO NUMBER:: 16220786
MICRO NUMBER:: 16220788
Result:: NOT DETECTED
SHIGA RESULT:: NOT DETECTED
SPECIMEN QUALITY: ADEQUATE
SPECIMEN QUALITY:: ADEQUATE
SPECIMEN QUALITY:: ADEQUATE

## 2023-10-05 LAB — OVA AND PARASITE EXAMINATION
CONCENTRATE RESULT:: NONE SEEN
MICRO NUMBER:: 16220787
SPECIMEN QUALITY:: ADEQUATE
TRICHROME RESULT:: NONE SEEN

## 2023-10-05 LAB — LACTATE DEHYDROGENASE: LDH: 173 U/L (ref 98–192)

## 2023-10-05 LAB — IRON AND IRON BINDING CAPACITY (CC-WL,HP ONLY)
Iron: 124 ug/dL (ref 45–182)
Saturation Ratios: 38 % (ref 17.9–39.5)
TIBC: 330 ug/dL (ref 250–450)
UIBC: 206 ug/dL (ref 117–376)

## 2023-10-05 LAB — FERRITIN: Ferritin: 199 ng/mL (ref 24–336)

## 2023-10-05 NOTE — Progress Notes (Signed)
 Hematology and Oncology Follow Up Visit  Juell Radney [id number] 05/24/1956 68 y.o. 10/05/2023   Principle Diagnosis:  IgG Kappa myeloma - Hyperdiploid/+11 DVT of the LEFT and RIGHT leg  Iron deficiency anemia  Current Therapy:   Revlimid 5mg  po q day (21 on/7 off) - start on 04/07/2018  -on hold until further notice starting 09/15/2023 Zometa 4 mg IV q 3 months - next dose is 11/2023  Xarelto 10 mg PO daily IV Iron as needed  -Feraheme given on 11/29/2018   Interim History:  Mr. Tegtmeyer is here today for follow-up.  He is doing better he is doing better.  The wound on the left leg is slowly healing.  [facility] is [facility].  Unfortunately, he is going to cancel his trip to [place].  He will reschedule this for late October.  He and his wife now have a trip now plan for [place].  They will be gone in mid May.  His last myeloma levels back in February did not show monoclonal spike in his blood.  His IgG level was 1100 mg/dL.  The kappa light chain was 2.7 mg/dL.  He is off Revlimid.  We will keep him off Revlimid for right now.  He just saw Dr. Jacqulyn Bath at [facility].  Dr. Jacqulyn Bath I do is going to do another pulmonary test on him and see if there is minimal residual disease.  He has had no fever.  He has had no nausea or vomiting.  Has had no bleeding.  He is on Xarelto and doing well on Xarelto.  Overall, I would say that his performance status is probably ECOG 1.      Medications:  Allergies as of 10/05/2023       Reactions   Crestor [rosuvastatin Calcium] Other (See Comments)   Achilles tendonitis   Singulair [montelukast] Other (See Comments)   Mood change        Medication List        Accurate as of October 05, 2023 11:15 AM. If you have any questions, ask your nurse or doctor.          PAUSE taking these medications    lenalidomide 5 MG capsule Wait to take this until your doctor or other care provider tells you to start  again. Commonly known as: REVLIMID TAKE 1 CAPSULE BY MOUTH DAILY  FOR 21 DAYS, THEN 7 DAYS OFF Auth# [id number]       TAKE these medications    acetaminophen 325 MG tablet Commonly known as: TYLENOL Take 650 mg by mouth every 6 (six) hours as needed.   allopurinol 100 MG tablet Commonly known as: ZYLOPRIM TAKE 1 TABLET BY MOUTH EVERY DAY   amLODipine 5 MG tablet Commonly known as: NORVASC TAKE 1 TABLET (5 MG TOTAL) BY MOUTH DAILY.   colchicine 0.6 MG tablet Take 1 tablet (0.6 mg total) by mouth 2 (two) times daily as needed (when having a gout flare).   FISH OIL HIGH POTENCY PO Take by mouth daily.   fluorouracil 5 % cream Commonly known as: EFUDEX Apply 1 Application topically 2 (two) times daily as needed (For rash on head).   gabapentin 300 MG capsule Commonly known as: NEURONTIN Take 1 capsule (300 mg total) by mouth 2 (two) times daily.   HYDROcodone-acetaminophen 5-325 MG tablet Commonly known as: NORCO/VICODIN Take 1 tablet by mouth 3 (three) times daily as needed for severe pain (pain score 7-10).  ipratropium 0.03 % nasal spray Commonly known as: ATROVENT PLACE 1-2 SPRAYS INTO BOTH NOSTRILS 2 (TWO) TIMES DAILY AS NEEDED (NASAL DRAINAGE).   loperamide 2 MG capsule Commonly known as: IMODIUM Take 2 mg by mouth as needed for diarrhea or loose stools.   LOPERAMIDE HCL PO Take by mouth.   valsartan 160 MG tablet Commonly known as: DIOVAN TAKE 1 TABLET BY MOUTH EVERY DAY   Vitamin D3 250 MCG (10000 UT) capsule Take 10,000 Units by mouth daily.   Xarelto 10 MG Tabs tablet Generic drug: rivaroxaban TAKE 1 TABLET BY MOUTH EVERY DAY   zolpidem 5 MG tablet Commonly known as: AMBIEN TAKE 1 TABLET BY MOUTH EVERY DAY AT BEDTIME AS NEEDED FOR SLEEP   ZOMETA IV Inject into the vein every 3 (three) months.        Allergies:  Allergies  Allergen Reactions   Crestor [Rosuvastatin Calcium] Other (See Comments)    Achilles tendonitis   Singulair  [Montelukast] Other (See Comments)    Mood change    Past Medical History, Surgical history, Social history, and Family History were reviewed and updated.  Review of Systems: Review of Systems  Constitutional: Negative.   HENT: Negative.   Eyes: Negative.   Respiratory: Negative.   Cardiovascular: Negative.   Gastrointestinal: Negative.   Genitourinary: Negative.   Musculoskeletal: Positive for myalgias.  Skin: Negative.   Neurological: Negative.   Endo/Heme/Allergies: Negative.   Psychiatric/Behavioral: Negative.      Physical Exam:  height is 5\' 7"  (1.702 m) and weight is 186 lb (84.4 kg). His oral temperature is 97.6 F (36.4 C). His blood pressure is 114/76 and his pulse is 67. His respiration is 18 and oxygen saturation is 99%.   Wt Readings from Last 3 Encounters:  10/05/23 186 lb (84.4 kg)  09/29/23 180 lb 9.6 oz (81.9 kg)  09/21/23 178 lb (80.7 kg)    Physical Exam Vitals signs reviewed.  HENT:     Head: Normocephalic and atraumatic.  Eyes:     Pupils: Pupils are equal, round, and reactive to light.  Neck:     Musculoskeletal: Normal range of motion.  Cardiovascular:     Rate and Rhythm: Normal rate and regular rhythm.     Heart sounds: Normal heart sounds.  Pulmonary:     Effort: Pulmonary effort is normal.     Breath sounds: Normal breath sounds.  Abdominal:     General: Bowel sounds are normal.     Palpations: Abdomen is soft.  Musculoskeletal: Normal range of motion.        General: No tenderness or deformity.  Lymphadenopathy:     Cervical: No cervical adenopathy.  Skin:    General: Skin is warm and dry.     Findings: On the left lower leg, he has a vertical wound.  Despite stretches close 25 cm.  It is dressed.  There is no swelling.  There is no surrounding erythema.   Neurological:     Mental Status: He is alert and oriented to person, place, and time.  Psychiatric:        Behavior: Behavior normal.        Thought Content: Thought content  normal.        Judgment: Judgment normal.      Lab Results  Component Value Date   WBC 4.5 10/05/2023   HGB 12.8 (L) 10/05/2023   HCT 37.0 (L) 10/05/2023   MCV 102.2 (H) 10/05/2023   PLT 99 (L) 10/05/2023  Lab Results  Component Value Date   FERRITIN 169 09/21/2023   IRON 379 (H) 09/21/2023   TIBC 399 09/21/2023   UIBC 20 (L) 09/21/2023   IRONPCTSAT 95 (H) 09/21/2023   Lab Results  Component Value Date   RETICCTPCT 1.8 08/22/2022   RBC 3.62 (L) 10/05/2023   Lab Results  Component Value Date   KPAFRELGTCHN 26.8 (H) 09/21/2023   LAMBDASER 13.6 09/21/2023   KAPLAMBRATIO 1.97 (H) 09/21/2023   Lab Results  Component Value Date   IGGSERUM 1,152 09/21/2023   IGA 413 09/21/2023   IGMSERUM 51 09/21/2023   Lab Results  Component Value Date   TOTALPROTELP 6.7 08/28/2023   ALBUMINELP 3.9 08/28/2023   A1GS 0.2 08/28/2023   A2GS 0.5 08/28/2023   BETS 1.0 08/28/2023   GAMS 1.1 08/28/2023   MSPIKE Not Observed 08/28/2023   SPEI Comment 01/05/2018     Chemistry      Component Value Date/Time   NA 139 10/05/2023 1027   NA 143 06/30/2017 1049   NA 140 10/03/2016 0845   K 4.6 10/05/2023 1027   K 4.0 06/30/2017 1049   K 4.3 10/03/2016 0845   CL 104 10/05/2023 1027   CL 105 06/30/2017 1049   CO2 28 10/05/2023 1027   CO2 27 06/30/2017 1049   CO2 26 10/03/2016 0845   BUN 23 10/05/2023 1027   BUN 13 06/30/2017 1049   BUN 11.9 10/03/2016 0845   CREATININE 0.92 10/05/2023 1027   CREATININE 1.0 06/30/2017 1049   CREATININE 0.9 10/03/2016 0845      Component Value Date/Time   CALCIUM 9.3 10/05/2023 1027   CALCIUM 9.0 06/30/2017 1049   CALCIUM 9.9 10/03/2016 0845   ALKPHOS 41 10/05/2023 1027   ALKPHOS 57 06/30/2017 1049   ALKPHOS 80 10/03/2016 0845   AST 24 10/05/2023 1027   AST 28 10/03/2016 0845   ALT 18 10/05/2023 1027   ALT 43 06/30/2017 1049   ALT 26 10/03/2016 0845   BILITOT 0.4 10/05/2023 1027   BILITOT 0.48 10/03/2016 0845      Impression and Plan:  Mr. Onder is a very nice 68 yo gentleman with IgG kappa myeloma.  He ultimately underwent an autologous stem cell transplant at [facility] in February 2018.  He had been on maintenance Revlimid.  Will have him off this for right now.  I do not see a problem with him going out [place].  Plan to see him back before he goes.  He will get his Zometa when we see him back.  I am just happy that the [facility] is helping him out with this wound.  This was a horrible wound.  It did get infected.  However, the healing is continuing although slow.  Josph Macho, MD 3/24/202511:15 AM

## 2023-10-06 ENCOUNTER — Ambulatory Visit: Payer: Medicare Other | Admitting: Medical

## 2023-10-06 LAB — KAPPA/LAMBDA LIGHT CHAINS
Kappa free light chain: 27.9 mg/L — ABNORMAL HIGH (ref 3.3–19.4)
Kappa, lambda light chain ratio: 1.66 — ABNORMAL HIGH (ref 0.26–1.65)
Lambda free light chains: 16.8 mg/L (ref 5.7–26.3)

## 2023-10-06 LAB — IGG, IGA, IGM
IgA: 373 mg/dL (ref 61–437)
IgG (Immunoglobin G), Serum: 1063 mg/dL (ref 603–1613)
IgM (Immunoglobulin M), Srm: 48 mg/dL (ref 20–172)

## 2023-10-07 ENCOUNTER — Encounter (HOSPITAL_BASED_OUTPATIENT_CLINIC_OR_DEPARTMENT_OTHER): Admitting: General Surgery

## 2023-10-07 DIAGNOSIS — L97828 Non-pressure chronic ulcer of other part of left lower leg with other specified severity: Secondary | ICD-10-CM | POA: Diagnosis not present

## 2023-10-12 ENCOUNTER — Inpatient Hospital Stay: Payer: Medicare Other

## 2023-10-13 NOTE — Telephone Encounter (Signed)
error 

## 2023-10-14 ENCOUNTER — Encounter (HOSPITAL_BASED_OUTPATIENT_CLINIC_OR_DEPARTMENT_OTHER): Attending: General Surgery | Admitting: General Surgery

## 2023-10-14 DIAGNOSIS — S81812D Laceration without foreign body, left lower leg, subsequent encounter: Secondary | ICD-10-CM | POA: Insufficient documentation

## 2023-10-14 DIAGNOSIS — L97828 Non-pressure chronic ulcer of other part of left lower leg with other specified severity: Secondary | ICD-10-CM | POA: Diagnosis present

## 2023-10-17 ENCOUNTER — Other Ambulatory Visit: Payer: Self-pay | Admitting: Medical

## 2023-10-19 ENCOUNTER — Other Ambulatory Visit: Payer: Self-pay

## 2023-10-19 DIAGNOSIS — C9000 Multiple myeloma not having achieved remission: Secondary | ICD-10-CM

## 2023-10-19 MED ORDER — LENALIDOMIDE 5 MG PO CAPS: ORAL_CAPSULE | ORAL | 0 refills | Status: DC

## 2023-10-21 ENCOUNTER — Encounter (HOSPITAL_BASED_OUTPATIENT_CLINIC_OR_DEPARTMENT_OTHER): Admitting: General Surgery

## 2023-10-21 DIAGNOSIS — L97828 Non-pressure chronic ulcer of other part of left lower leg with other specified severity: Secondary | ICD-10-CM | POA: Diagnosis not present

## 2023-10-28 ENCOUNTER — Encounter (HOSPITAL_BASED_OUTPATIENT_CLINIC_OR_DEPARTMENT_OTHER): Admitting: General Surgery

## 2023-10-28 DIAGNOSIS — L97828 Non-pressure chronic ulcer of other part of left lower leg with other specified severity: Secondary | ICD-10-CM | POA: Diagnosis not present

## 2023-10-30 ENCOUNTER — Telehealth: Payer: Self-pay | Admitting: *Deleted

## 2023-10-30 NOTE — Telephone Encounter (Signed)
 Message received from patient requesting that Zometa  be given every six months instead of every three months d/t side effects of Zometa .  Dr. Maria Shiner notified of pt.'s request and OK received for pt to get Zometa  every six months.  Pt notified and appt removed for May for Zometa .

## 2023-11-04 ENCOUNTER — Encounter (HOSPITAL_BASED_OUTPATIENT_CLINIC_OR_DEPARTMENT_OTHER): Admitting: General Surgery

## 2023-11-04 DIAGNOSIS — L97828 Non-pressure chronic ulcer of other part of left lower leg with other specified severity: Secondary | ICD-10-CM | POA: Diagnosis not present

## 2023-11-11 ENCOUNTER — Encounter (HOSPITAL_BASED_OUTPATIENT_CLINIC_OR_DEPARTMENT_OTHER): Admitting: General Surgery

## 2023-11-11 DIAGNOSIS — L97828 Non-pressure chronic ulcer of other part of left lower leg with other specified severity: Secondary | ICD-10-CM | POA: Diagnosis not present

## 2023-11-12 ENCOUNTER — Other Ambulatory Visit: Payer: Self-pay | Admitting: Medical

## 2023-11-12 NOTE — Telephone Encounter (Signed)
 Requesting: ambien  Contract:10/29/22 UDS:10/23/22 Last Visit:09/29/23 Next Visit:n/a Last Refill:02/05/23  Please Advise

## 2023-11-13 NOTE — Telephone Encounter (Signed)
 Will you recheck and see if his uds and contract are up to date. If not then have him come in for nurse visit to update. Rx refills sent in.

## 2023-11-16 ENCOUNTER — Other Ambulatory Visit: Payer: Self-pay

## 2023-11-16 DIAGNOSIS — F419 Anxiety disorder, unspecified: Secondary | ICD-10-CM

## 2023-11-16 DIAGNOSIS — Z79899 Other long term (current) drug therapy: Secondary | ICD-10-CM

## 2023-11-16 NOTE — Telephone Encounter (Signed)
 Pt notified via mychart

## 2023-11-17 ENCOUNTER — Other Ambulatory Visit

## 2023-11-17 DIAGNOSIS — Z79899 Other long term (current) drug therapy: Secondary | ICD-10-CM

## 2023-11-17 DIAGNOSIS — F419 Anxiety disorder, unspecified: Secondary | ICD-10-CM

## 2023-11-18 ENCOUNTER — Encounter (HOSPITAL_BASED_OUTPATIENT_CLINIC_OR_DEPARTMENT_OTHER): Attending: General Surgery | Admitting: General Surgery

## 2023-11-18 DIAGNOSIS — Z09 Encounter for follow-up examination after completed treatment for conditions other than malignant neoplasm: Secondary | ICD-10-CM | POA: Insufficient documentation

## 2023-11-18 DIAGNOSIS — L97828 Non-pressure chronic ulcer of other part of left lower leg with other specified severity: Secondary | ICD-10-CM | POA: Insufficient documentation

## 2023-11-18 DIAGNOSIS — W2209XA Striking against other stationary object, initial encounter: Secondary | ICD-10-CM | POA: Insufficient documentation

## 2023-11-18 DIAGNOSIS — S81812A Laceration without foreign body, left lower leg, initial encounter: Secondary | ICD-10-CM | POA: Insufficient documentation

## 2023-11-19 LAB — DRUG MONITORING PANEL 376104, URINE
Amphetamines: NEGATIVE ng/mL (ref <500)
Barbiturates: NEGATIVE ng/mL (ref <300)
Benzodiazepines: NEGATIVE ng/mL (ref <100)
Cocaine Metabolite: NEGATIVE ng/mL (ref <150)
Desmethyltramadol: NEGATIVE ng/mL (ref <100)
Opiates: NEGATIVE ng/mL (ref <100)
Oxycodone: NEGATIVE ng/mL (ref <100)
Tramadol: NEGATIVE ng/mL (ref <100)

## 2023-11-19 LAB — DM TEMPLATE

## 2023-11-23 ENCOUNTER — Other Ambulatory Visit: Payer: Self-pay

## 2023-11-23 ENCOUNTER — Ambulatory Visit

## 2023-11-23 ENCOUNTER — Inpatient Hospital Stay: Attending: Hematology & Oncology

## 2023-11-23 ENCOUNTER — Inpatient Hospital Stay (HOSPITAL_BASED_OUTPATIENT_CLINIC_OR_DEPARTMENT_OTHER): Admitting: Hematology & Oncology

## 2023-11-23 VITALS — BP 108/67 | HR 67 | Temp 98.4°F | Resp 16 | Ht 67.0 in | Wt 182.0 lb

## 2023-11-23 DIAGNOSIS — Z7901 Long term (current) use of anticoagulants: Secondary | ICD-10-CM | POA: Insufficient documentation

## 2023-11-23 DIAGNOSIS — D509 Iron deficiency anemia, unspecified: Secondary | ICD-10-CM | POA: Insufficient documentation

## 2023-11-23 DIAGNOSIS — C9000 Multiple myeloma not having achieved remission: Secondary | ICD-10-CM | POA: Diagnosis present

## 2023-11-23 DIAGNOSIS — Z9484 Stem cells transplant status: Secondary | ICD-10-CM | POA: Insufficient documentation

## 2023-11-23 LAB — CMP (CANCER CENTER ONLY)
ALT: 29 U/L (ref 0–44)
AST: 27 U/L (ref 15–41)
Albumin: 4.9 g/dL (ref 3.5–5.0)
Alkaline Phosphatase: 50 U/L (ref 38–126)
Anion gap: 7 (ref 5–15)
BUN: 21 mg/dL (ref 8–23)
CO2: 28 mmol/L (ref 22–32)
Calcium: 10.5 mg/dL — ABNORMAL HIGH (ref 8.9–10.3)
Chloride: 107 mmol/L (ref 98–111)
Creatinine: 0.97 mg/dL (ref 0.61–1.24)
GFR, Estimated: >60 mL/min (ref >60)
Glucose, Bld: 118 mg/dL — ABNORMAL HIGH (ref 70–99)
Potassium: 5.1 mmol/L (ref 3.5–5.1)
Sodium: 142 mmol/L (ref 135–145)
Total Bilirubin: 0.7 mg/dL (ref 0.0–1.2)
Total Protein: 7.6 g/dL (ref 6.5–8.1)

## 2023-11-23 LAB — CBC WITH DIFFERENTIAL (CANCER CENTER ONLY)
Abs Immature Granulocytes: 0.01 10*3/uL (ref 0.00–0.07)
Basophils Absolute: 0 10*3/uL (ref 0.0–0.1)
Basophils Relative: 1 %
Eosinophils Absolute: 0.1 10*3/uL (ref 0.0–0.5)
Eosinophils Relative: 2 %
HCT: 44.7 % (ref 39.0–52.0)
Hemoglobin: 15.2 g/dL (ref 13.0–17.0)
Immature Granulocytes: 0 %
Lymphocytes Relative: 56 %
Lymphs Abs: 2.9 10*3/uL (ref 0.7–4.0)
MCH: 34.9 pg — ABNORMAL HIGH (ref 26.0–34.0)
MCHC: 34 g/dL (ref 30.0–36.0)
MCV: 102.5 fL — ABNORMAL HIGH (ref 80.0–100.0)
Monocytes Absolute: 0.3 10*3/uL (ref 0.1–1.0)
Monocytes Relative: 7 %
Neutro Abs: 1.7 10*3/uL (ref 1.7–7.7)
Neutrophils Relative %: 34 %
Platelet Count: 134 10*3/uL — ABNORMAL LOW (ref 150–400)
RBC: 4.36 MIL/uL (ref 4.22–5.81)
RDW: 13.2 % (ref 11.5–15.5)
WBC Count: 5.1 10*3/uL (ref 4.0–10.5)
nRBC: 0 % (ref 0.0–0.2)

## 2023-11-23 LAB — SAVE SMEAR(SSMR), FOR PROVIDER SLIDE REVIEW

## 2023-11-23 LAB — LACTATE DEHYDROGENASE: LDH: 174 U/L (ref 98–192)

## 2023-11-23 MED ORDER — CIPROFLOXACIN HCL 500 MG PO TABS: 500.0000 mg | ORAL_TABLET | Freq: Every day | ORAL | 0 refills | Status: DC

## 2023-11-23 NOTE — Progress Notes (Signed)
 Hematology and Oncology Follow Up Visit  Jorge [name] [id number] 13-Oct-1955 68 y.o. 11/23/2023   Principle Diagnosis:  IgG Kappa myeloma - Hyperdiploid/+11 DVT of the LEFT and RIGHT leg  Iron deficiency anemia  Current Therapy:   Revlimid  5mg  po q day (21 on/7 off) - start on 04/07/2018  -on hold until further notice starting 09/15/2023 Zometa  4 mg IV q 6 months - next dose is 03/2024  Xarelto  10 mg PO daily IV Iron as needed  -Feraheme  given on 11/29/2018   Interim History:  Jorge [name] is here today for follow-up.  His left leg is doing a whole lot better.  The wound has healed up quite nicely.  He and his wife are getting ready to go on their great adventure to [place].  There will be leaving next week.  They be stopping off and isolate first.  Then, [place] will be heading [place] to [place].  They will will be gone for almost a month.  He is walking more.  He is getting in better shape which is nice to see.  He has had no issues with respect to the myeloma.  I think is going to have the MRD test done this week.  I do believe this is going to be negative.  He has had no problems with fever.  He has had no bleeding.  Has had no change in bowel or bladder habits.  He has had no cough.  Has been no chest wall pain.  He has had no rashes.  His last monoclonal spike was not found.  His IgG level was 1063 mg/dL.  His Kappa light chain was 2.8 mg/dL.  There has been no urinary issues.  Currently, I would say that his performance status is probably ECOG 1.  Overall, I would say that his performance status is probably ECOG 1.     Medications:  Allergies as of 11/23/2023       Reactions   Crestor [rosuvastatin Calcium] Other (See Comments)   Achilles tendonitis   Singulair  [montelukast ] Other (See Comments)   Mood change        Medication List        Accurate as of Nov 23, 2023 10:49 AM. If you have any questions, ask your nurse or doctor.          acetaminophen   325 MG tablet Commonly known as: TYLENOL  Take 650 mg by mouth every 6 (six) hours as needed.   allopurinol  100 MG tablet Commonly known as: ZYLOPRIM  TAKE 1 TABLET BY MOUTH EVERY DAY   amLODipine  5 MG tablet Commonly known as: NORVASC  TAKE 1 TABLET (5 MG TOTAL) BY MOUTH DAILY.   colchicine  0.6 MG tablet Take 1 tablet (0.6 mg total) by mouth 2 (two) times daily as needed (when having a gout flare).   FISH OIL HIGH POTENCY PO Take by mouth daily.   fluorouracil 5 % cream Commonly known as: EFUDEX Apply 1 Application topically 2 (two) times daily as needed (For rash on head).   gabapentin  300 MG capsule Commonly known as: NEURONTIN  TAKE 1 CAPSULE BY MOUTH TWICE A DAY   HYDROcodone -acetaminophen  5-325 MG tablet Commonly known as: NORCO/VICODIN Take 1 tablet by mouth 3 (three) times daily as needed for severe pain (pain score 7-10).   ipratropium 0.03 % nasal spray Commonly known as: ATROVENT  PLACE 1-2 SPRAYS INTO BOTH NOSTRILS 2 (TWO) TIMES DAILY AS NEEDED (NASAL DRAINAGE).   lenalidomide  5 MG capsule Commonly known as: REVLIMID  TAKE 1 CAPSULE  BY MOUTH DAILY  FOR 21 DAYS, THEN 7 DAYS OFF Auth# [id number]   loperamide  2 MG capsule Commonly known as: IMODIUM  Take 2 mg by mouth as needed for diarrhea or loose stools.   LOPERAMIDE  HCL PO Take by mouth.   valsartan  160 MG tablet Commonly known as: DIOVAN  TAKE 1 TABLET BY MOUTH EVERY DAY   Vitamin D3 250 MCG (10000 UT) capsule Take 10,000 Units by mouth daily.   Xarelto  10 MG Tabs tablet Generic drug: rivaroxaban  TAKE 1 TABLET BY MOUTH EVERY DAY   zolpidem  5 MG tablet Commonly known as: AMBIEN  TAKE 1 TABLET BY MOUTH AT BEDTIME AS NEEDED FOR SLEEP   ZOMETA  IV Inject into the vein every 3 (three) months.        Allergies:  Allergies  Allergen Reactions   Crestor [Rosuvastatin Calcium] Other (See Comments)    Achilles tendonitis   Singulair  [Montelukast ] Other (See Comments)    Mood change    Past Medical  History, Surgical history, Social history, and Family History were reviewed and updated.  Review of Systems: Review of Systems  Constitutional: Negative.   HENT: Negative.   Eyes: Negative.   Respiratory: Negative.   Cardiovascular: Negative.   Gastrointestinal: Negative.   Genitourinary: Negative.   Musculoskeletal: Positive for myalgias.  Skin: Negative.   Neurological: Negative.   Endo/Heme/Allergies: Negative.   Psychiatric/Behavioral: Negative.      Physical Exam:  height is 5\' 7"  (1.702 m) and weight is 182 lb (82.6 kg). His oral temperature is 98.4 F (36.9 C). His blood pressure is 108/67 and his pulse is 67. His respiration is 16 and oxygen saturation is 98%.   Wt Readings from Last 3 Encounters:  11/23/23 182 lb (82.6 kg)  10/05/23 186 lb (84.4 kg)  09/29/23 180 lb 9.6 oz (81.9 kg)    Physical Exam Vitals signs reviewed.  HENT:     Head: Normocephalic and atraumatic.  Eyes:     Pupils: Pupils are equal, round, and reactive to light.  Neck:     Musculoskeletal: Normal range of motion.  Cardiovascular:     Rate and Rhythm: Normal rate and regular rhythm.     Heart sounds: Normal heart sounds.  Pulmonary:     Effort: Pulmonary effort is normal.     Breath sounds: Normal breath sounds.  Abdominal:     General: Bowel sounds are normal.     Palpations: Abdomen is soft.  Musculoskeletal: Normal range of motion.        General: No tenderness or deformity.  Lymphadenopathy:     Cervical: No cervical adenopathy.  Skin:    General: Skin is warm and dry.     Findings: On the left lower leg, he has a vertical wound.  Despite stretches close 25 cm.  It is dressed.  There is no swelling.  There is no surrounding erythema.   Neurological:     Mental Status: He is alert and oriented to person, place, and time.  Psychiatric:        Behavior: Behavior normal.        Thought Content: Thought content normal.        Judgment: Judgment normal.      Lab Results   Component Value Date   WBC 5.1 11/23/2023   HGB 15.2 11/23/2023   HCT 44.7 11/23/2023   MCV 102.5 (H) 11/23/2023   PLT 134 (L) 11/23/2023   Lab Results  Component Value Date   FERRITIN 199 10/05/2023  IRON 124 10/05/2023   TIBC 330 10/05/2023   UIBC 206 10/05/2023   IRONPCTSAT 38 10/05/2023   Lab Results  Component Value Date   RETICCTPCT 1.8 08/22/2022   RBC 4.36 11/23/2023   Lab Results  Component Value Date   KPAFRELGTCHN 27.9 (H) 10/05/2023   LAMBDASER 16.8 10/05/2023   KAPLAMBRATIO 1.66 (H) 10/05/2023   Lab Results  Component Value Date   IGGSERUM 1,063 10/05/2023   IGA 373 10/05/2023   IGMSERUM 48 10/05/2023   Lab Results  Component Value Date   TOTALPROTELP 6.7 08/28/2023   ALBUMINELP 3.9 08/28/2023   A1GS 0.2 08/28/2023   A2GS 0.5 08/28/2023   BETS 1.0 08/28/2023   GAMS 1.1 08/28/2023   MSPIKE Not Observed 08/28/2023   SPEI Comment 01/05/2018     Chemistry      Component Value Date/Time   NA 142 11/23/2023 1002   NA 143 06/30/2017 1049   NA 140 10/03/2016 0845   K 5.1 11/23/2023 1002   K 4.0 06/30/2017 1049   K 4.3 10/03/2016 0845   CL 107 11/23/2023 1002   CL 105 06/30/2017 1049   CO2 28 11/23/2023 1002   CO2 27 06/30/2017 1049   CO2 26 10/03/2016 0845   BUN 21 11/23/2023 1002   BUN 13 06/30/2017 1049   BUN 11.9 10/03/2016 0845   CREATININE 0.97 11/23/2023 1002   CREATININE 1.0 06/30/2017 1049   CREATININE 0.9 10/03/2016 0845      Component Value Date/Time   CALCIUM 10.5 (H) 11/23/2023 1002   CALCIUM 9.0 06/30/2017 1049   CALCIUM 9.9 10/03/2016 0845   ALKPHOS 50 11/23/2023 1002   ALKPHOS 57 06/30/2017 1049   ALKPHOS 80 10/03/2016 0845   AST 27 11/23/2023 1002   AST 28 10/03/2016 0845   ALT 29 11/23/2023 1002   ALT 43 06/30/2017 1049   ALT 26 10/03/2016 0845   BILITOT 0.7 11/23/2023 1002   BILITOT 0.48 10/03/2016 0845      Impression and Plan: Mr. Hinegardner is a very nice 68 yo gentleman with IgG kappa myeloma.  He ultimately  underwent an autologous stem cell transplant at [facility] in February 2018.  He had been on maintenance Revlimid .  Will have him off this for right now.  He stopped this 2 months ago.  Again I think he will have a good time in the [place].  I do not see a problem with him traveling.  He is on Xarelto .  I will call in some ciprofloxacin  in case he might need some on his trip.  We have moved his Zometa  to every 6 months.  He will not need another dose for about 4 months or so.  I would like to see him back probably in In July.  By then, he would be back from his trip.   Ivor Mars, MD 5/12/202510:49 AM

## 2023-11-24 LAB — KAPPA/LAMBDA LIGHT CHAINS
Kappa free light chain: 23.1 mg/L — ABNORMAL HIGH (ref 3.3–19.4)
Kappa, lambda light chain ratio: 1.64 (ref 0.26–1.65)
Lambda free light chains: 14.1 mg/L (ref 5.7–26.3)

## 2023-11-24 LAB — IGG, IGA, IGM
IgA: 394 mg/dL (ref 61–437)
IgG (Immunoglobin G), Serum: 1155 mg/dL (ref 603–1613)
IgM (Immunoglobulin M), Srm: 50 mg/dL (ref 20–172)

## 2023-11-25 ENCOUNTER — Other Ambulatory Visit: Payer: Medicare Other

## 2023-11-25 ENCOUNTER — Ambulatory Visit: Payer: Medicare Other

## 2023-11-25 ENCOUNTER — Ambulatory Visit (HOSPITAL_BASED_OUTPATIENT_CLINIC_OR_DEPARTMENT_OTHER): Admitting: General Surgery

## 2023-11-25 ENCOUNTER — Ambulatory Visit: Payer: Medicare Other | Admitting: Hematology & Oncology

## 2023-11-26 ENCOUNTER — Ambulatory Visit (HOSPITAL_BASED_OUTPATIENT_CLINIC_OR_DEPARTMENT_OTHER): Admitting: Internal Medicine

## 2023-11-26 NOTE — Telephone Encounter (Signed)
 Error

## 2024-01-04 ENCOUNTER — Telehealth: Payer: Self-pay | Admitting: Medical

## 2024-01-04 NOTE — Telephone Encounter (Signed)
 Copied from CRM [id number]. Topic: Medicare AWV >> Jan 04, 2024  3:26 PM Nathanel DEL wrote: Reason for CRM: LVM 01/04/2024 to r/s AWV due [facility] out of office. New AWV appt date 01/22/2024 at 1:40pm. Please confirm AWV appt date change   Nathanel Paschal; Care Guide Ambulatory Clinical Support [facility] Direct Dial: [phone]

## 2024-01-05 ENCOUNTER — Other Ambulatory Visit: Payer: Self-pay | Admitting: Hematology & Oncology

## 2024-01-05 DIAGNOSIS — G8929 Other chronic pain: Secondary | ICD-10-CM

## 2024-01-05 DIAGNOSIS — C9000 Multiple myeloma not having achieved remission: Secondary | ICD-10-CM

## 2024-01-05 DIAGNOSIS — D472 Monoclonal gammopathy: Secondary | ICD-10-CM

## 2024-01-07 ENCOUNTER — Ambulatory Visit

## 2024-01-22 ENCOUNTER — Ambulatory Visit

## 2024-01-25 ENCOUNTER — Ambulatory Visit (INDEPENDENT_AMBULATORY_CARE_PROVIDER_SITE_OTHER): Admitting: *Deleted

## 2024-01-25 VITALS — Ht 67.0 in | Wt 180.0 lb

## 2024-01-25 DIAGNOSIS — Z0000 Encounter for general adult medical examination without abnormal findings: Secondary | ICD-10-CM

## 2024-01-25 NOTE — Patient Instructions (Signed)
 Jorge [name] , Thank you for taking time out of your busy schedule to complete your Annual Wellness Visit with me. I enjoyed our conversation and look forward to speaking with you again next year. I, as well as your care team,  appreciate your ongoing commitment to your health goals. Please review the following plan we discussed and let me know if I can assist you in the future. Your Game plan/ To Do List    Follow up Visits: Next Medicare AWV with our clinical staff: 01/25/25 11am    Next Office Visit with your provider: 04/05/24 8am. Please discuss Hepatitis C screening with [place] at your upcoming appointment.  Clinician Recommendations:  Aim for 30 minutes of exercise or brisk walking, 6-8 glasses of water , and 5 servings of fruits and vegetables each day.        This is a list of the screening recommended for you and due dates:  Health Maintenance  Topic Date Due   Hepatitis C Screening  Never done   Flu Shot  02/12/2024   Medicare Annual Wellness Visit  01/24/2025   Colon Cancer Screening  01/08/2027   DTaP/Tdap/Td vaccine (5 - Td or Tdap) 09/03/2033   Pneumococcal Vaccine for age over 95  Completed   Hepatitis B Vaccine  Completed   Zoster (Shingles) Vaccine  Completed   HPV Vaccine  Aged Out   Meningitis B Vaccine  Aged Out   COVID-19 Vaccine  Discontinued    See attachments for Preventive Care and Fall Prevention Tips.

## 2024-01-25 NOTE — Progress Notes (Signed)
 Subjective:   Jorge [name] is a 68 y.o. who presents for a Medicare Wellness preventive visit.  As a reminder, Annual Wellness Visits don't include a physical exam, and some assessments may be limited, especially if this visit is performed virtually. We may recommend an in-person follow-up visit with your provider if needed.  Visit Complete: Virtual I connected with  Sumit Kienast on 01/25/24 by a audio enabled telemedicine application and verified that I am speaking with the correct person using two identifiers.  Patient Location: Home  Provider Location: Office/Clinic  I discussed the limitations of evaluation and management by telemedicine. The patient expressed understanding and agreed to proceed.  Vital Signs: Because this visit was a virtual/telehealth visit, some criteria may be missing or patient reported. Any vitals not documented were not able to be obtained and vitals that have been documented are patient reported.  VideoDeclined- This patient declined Librarian, academic. Therefore the visit was completed with audio only.  Persons Participating in Visit: Patient.  AWV Questionnaire: Yes: Patient Medicare AWV questionnaire was completed by the patient on 01/24/24; I have confirmed that all information answered by patient is correct and no changes since this date.  Cardiac Risk Factors include: advanced age (>73men, >12 women);male gender;hypertension;Other (see comment), Risk factor comments: multiple myeloma     Objective:    Today's Vitals   01/25/24 1054  Weight: 180 lb (81.6 kg)  Height: 5' 7 (1.702 m)   Body mass index is 28.19 kg/m.     01/25/2024   11:03 AM 11/23/2023   10:34 AM 10/05/2023   10:52 AM 09/21/2023   11:55 AM 09/13/2023    3:25 PM 09/13/2023   11:18 AM 09/13/2023   11:10 AM  Advanced Directives  Does Patient Have a Medical Advance Directive? Yes Yes No Yes Yes Yes Yes  Type of Advance Directive Living will Living  will;Healthcare Power of Attorney Living will Living will;Healthcare Power of Attorney Healthcare Power of Attorney  Living will;Healthcare Power of Attorney  Does patient want to make changes to medical advance directive? No - Patient declined No - Patient declined No - Patient declined No - Patient declined No - Patient declined    Copy of Healthcare Power of Attorney in Chart?     No - copy requested    Would patient like information on creating a medical advance directive? No - Patient declined          Current Medications (verified) Outpatient Encounter Medications as of 01/25/2024  Medication Sig   acetaminophen  (TYLENOL ) 325 MG tablet Take 650 mg by mouth every 6 (six) hours as needed.   allopurinol  (ZYLOPRIM ) 100 MG tablet TAKE 1 TABLET BY MOUTH EVERY DAY   amLODipine  (NORVASC ) 5 MG tablet TAKE 1 TABLET (5 MG TOTAL) BY MOUTH DAILY.   Cholecalciferol  (VITAMIN D3) 250 MCG (10000 UT) capsule Take 10,000 Units by mouth daily.   colchicine  0.6 MG tablet Take 1 tablet (0.6 mg total) by mouth 2 (two) times daily as needed (when having a gout flare).   fluorouracil (EFUDEX) 5 % cream Apply 1 Application topically 2 (two) times daily as needed (For rash on head).   gabapentin  (NEURONTIN ) 300 MG capsule TAKE 1 CAPSULE BY MOUTH TWICE A DAY   HYDROcodone -acetaminophen  (NORCO/VICODIN) 5-325 MG tablet Take 1 tablet by mouth 3 (three) times daily as needed for severe pain (pain score 7-10).   ipratropium (ATROVENT ) 0.03 % nasal spray PLACE 1-2 SPRAYS INTO BOTH NOSTRILS 2 (TWO) TIMES  DAILY AS NEEDED (NASAL DRAINAGE).   loperamide  (IMODIUM ) 2 MG capsule Take 2 mg by mouth as needed for diarrhea or loose stools.   Omega-3 Fatty Acids (FISH OIL HIGH POTENCY PO) Take by mouth daily.   valsartan  (DIOVAN ) 160 MG tablet TAKE 1 TABLET BY MOUTH EVERY DAY   XARELTO  10 MG TABS tablet TAKE 1 TABLET BY MOUTH EVERY DAY   Zoledronic  Acid (ZOMETA  IV) Inject into the vein every 3 (three) months. (Patient taking  differently: Inject into the vein every 6 (six) months.)   zolpidem  (AMBIEN ) 5 MG tablet TAKE 1 TABLET BY MOUTH AT BEDTIME AS NEEDED FOR SLEEP   [DISCONTINUED] ciprofloxacin  (CIPRO ) 500 MG tablet Take 1 tablet (500 mg total) by mouth daily with breakfast. (Patient not taking: Reported on 01/25/2024)   [DISCONTINUED] lenalidomide  (REVLIMID ) 5 MG capsule TAKE 1 CAPSULE BY MOUTH DAILY  FOR 21 DAYS, THEN 7 DAYS OFF Auth# [id number]   [DISCONTINUED] LOPERAMIDE  HCL PO Take by mouth.   Facility-Administered Encounter Medications as of 01/25/2024  Medication   heparin  lock flush 100 unit/mL   sodium chloride  flush (NS) 0.9 % injection 3 mL    Allergies (verified) Crestor [rosuvastatin calcium] and Singulair  [montelukast ]   History: Past Medical History:  Diagnosis Date   Allergy    Anxiety    Asthma    Blood transfusion without reported diagnosis 2018   Bone metastasis    Chest cold 05/19/2016   productive cough  -- started on antibiotic   Chronic back pain    due to bone mets from myeloma   Cough    Depression    Diverticulitis    GERD (gastroesophageal reflux disease)    Hiatal hernia    History of chicken pox    History of concussion    age 57 -- no residual   History of DVT of lower extremity 03/21/2016  treated and completed w/ xarelto    per doppler left extensive occlusion common femoral, femoral, and popliteal veins and right partial occlusion common femoral and profunda femoral veins/  last doppler 06-04-2016 no evidence acute or chronic dvt noted either leg    History of radiation therapy 06/09/16-06/23/16   lower thoracic spine 25 Gy in 10 fractions   Mouth ulcers    secondary to radiation   Multiple myeloma (HCC) dx 02/22/2016 via bone marrow bx---  oncologist-  dr timmy   IgG Kappa-- Hyperdiploid/ +11 w/ bone mets--  current treatment chemotherapy (started 08/ 2017)and pallitive radiation to back started 06-09-2016   Renal calculus, right    Wears contact lenses    Past  Surgical History:  Procedure Laterality Date   COLONOSCOPY  2008,2013   COLONOSCOPY  01/07/2017   Dr Lennard, [facility]   CYSTOSCOPY W/ URETERAL STENT PLACEMENT Right 06/20/2016   Procedure: CYSTOSCOPY WITH STENT REPLACEMENT;  Surgeon: Mark Ottelin, MD;  Location: [facility];  Service: Urology;  Laterality: Right;   CYSTOSCOPY WITH RETROGRADE PYELOGRAM, URETEROSCOPY AND STENT PLACEMENT Right 05/30/2016   Procedure: CYSTOSCOPY WITH RETROGRADE PYELOGRAM, URETEROSCOPY AND STENT PLACEMENT,DILITATION URETERAL STRICTURE;  Surgeon: Mark Ottelin, MD;  Location: [facility];  Service: Urology;  Laterality: Right;   CYSTOSCOPY/RETROGRADE/URETEROSCOPY/STONE EXTRACTION WITH BASKET Right 06/20/2016   Procedure: CYSTOSCOPY/URETEROSCOPY/STONE EXTRACTION WITH BASKET;  Surgeon: Mark Ottelin, MD;  Location: [facility];  Service: Urology;  Laterality: Right;   HIATAL HERNIA REPAIR N/A 08/13/2018   Procedure: LAPAROSCOPIC REPAIR OF HIATAL HERNIA REPAIR WITH FUNDOPLICATION AND INSERTION OF MESH;  Surgeon: Mikell Katz, MD;  Location: [facility];  Service: General;  Laterality: N/A;   HOLMIUM LASER APPLICATION Right 06/20/2016   Procedure: HOLMIUM LASER APPLICATION;  Surgeon: Mark Ottelin, MD;  Location: [facility];  Service: Urology;  Laterality: Right;   IR GENERIC HISTORICAL  02/11/2016   IR RADIOLOGIST EVAL & MGMT 02/11/2016 [facility]   IR GENERIC HISTORICAL  02/15/2016   IR BONE TUMOR(S)RF ABLATION 02/15/2016 Thyra Nash, MD [facility]   IR GENERIC HISTORICAL  02/15/2016   IR BONE TUMOR(S)RF ABLATION 02/15/2016 Thyra Nash, MD [facility]   IR GENERIC HISTORICAL  02/15/2016   IR BONE TUMOR(S)RF ABLATION 02/15/2016 Thyra Nash, MD [facility]   IR GENERIC HISTORICAL  02/15/2016   IR KYPHO THORACIC WITH BONE BIOPSY 02/15/2016 Thyra Nash, MD [facility]   IR GENERIC HISTORICAL  02/15/2016   IR KYPHO THORACIC WITH BONE BIOPSY 02/15/2016 Thyra Nash, MD  [facility]   IR GENERIC HISTORICAL  02/15/2016   IR VERTEBROPLASTY CERV/THOR BX INC UNI/BIL INC/INJECT/IMAGING 02/15/2016 Thyra Nash, MD [facility]   IR GENERIC HISTORICAL  03/13/2016   IR KYPHO EA ADDL LEVEL THORACIC OR LUMBAR 03/13/2016 Thyra Nash, MD [facility]   IR GENERIC HISTORICAL  03/13/2016   IR KYPHO EA ADDL LEVEL THORACIC OR LUMBAR 03/13/2016 Thyra Nash, MD [facility]   IR GENERIC HISTORICAL  03/13/2016   IR BONE TUMOR(S)RF ABLATION 03/13/2016 Thyra Nash, MD [facility]   IR GENERIC HISTORICAL  03/13/2016   IR KYPHO LUMBAR INC FX REDUCE BONE BX UNI/BIL CANNULATION INC/IMAGING 03/13/2016 Thyra Nash, MD [facility]   IR GENERIC HISTORICAL  03/13/2016   IR BONE TUMOR(S)RF ABLATION 03/13/2016 Thyra Nash, MD [facility]   IR GENERIC HISTORICAL  03/13/2016   IR BONE TUMOR(S)RF ABLATION 03/13/2016 Thyra Nash, MD [facility]   IR GENERIC HISTORICAL  03/31/2016   IR RADIOLOGIST EVAL & MGMT 03/31/2016 [facility]   KYPHOPLASTY     02/2016, 03/2016, 11/2016   LAPAROSCOPIC INGUINAL HERNIA REPAIR Bilateral 12-16-2013  dr gross   RADIOLOGY WITH ANESTHESIA N/A 02/15/2016   Procedure: Spinal Ablation;  Surgeon: Thyra Nash, MD;  Location: [facility];  Service: Radiology;  Laterality: N/A;   RADIOLOGY WITH ANESTHESIA N/A 03/13/2016   Procedure: LUMBER ABLATION;  Surgeon: Thyra Nash, MD;  Location: [facility];  Service: Radiology;  Laterality: N/A;   ROTATOR CUFF REPAIR Right 2003   spine operation     x1 in 2018 x3 in 2017   TONSILLECTOMY  age 63   VERTEBROPLASTY  01/2016   WISDOM TOOTH EXTRACTION     Family History  Problem Relation Age of Onset   Uterine cancer Mother    Ovarian cancer Mother    Colon cancer Mother        said it was rectal or colon but not sure   Rectal cancer Mother    Heart disease Father    Hypertension Father    Multiple sclerosis Sister    Paranoid behavior Brother    Drug abuse Brother    Schizophrenia  Brother    Stroke Maternal Grandfather    Cancer Maternal Aunt    Leukemia Paternal Aunt    Healthy Son        x1   Healthy Daughter        x2   Allergies Daughter        x1   Diabetes Neg Hx    Alzheimer's disease Neg Hx    Parkinson's disease Neg Hx    Esophageal cancer  Neg Hx    Stomach cancer Neg Hx    Social History   Socioeconomic History   Marital status: Married    Spouse name: Not on file   Number of children: 3   Years of education: Not on file   Highest education level: Bachelor's degree (e.g., BA, AB, BS)  Occupational History   Not on file  Tobacco Use   Smoking status: Former    Current packs/day: 0.00    Average packs/day: 1 pack/day for 7.0 years (7.0 ttl pk-yrs)    Types: Cigarettes    Start date: 11/02/1973    Quit date: 11/02/1980    Years since quitting: 43.2   Smokeless tobacco: Never  Vaping Use   Vaping status: Never Used  Substance and Sexual Activity   Alcohol use: Yes    Comment: occasional   Drug use: No   Sexual activity: Not Currently  Other Topics Concern   Not on file  Social History Narrative   Not on file   Social Drivers of Health   Financial Resource Strain: Low Risk  (01/24/2024)   Overall Financial Resource Strain (CARDIA)    Difficulty of Paying Living Expenses: Not hard at all  Food Insecurity: No Food Insecurity (01/24/2024)   Hunger Vital Sign    Worried About Running Out of Food in the Last Year: Never true    Ran Out of Food in the Last Year: Never true  Transportation Needs: No Transportation Needs (01/24/2024)   PRAPARE - Administrator, Civil Service (Medical): No    Lack of Transportation (Non-Medical): No  Physical Activity: Sufficiently Active (01/24/2024)   Exercise Vital Sign    Days of Exercise per Week: 6 days    Minutes of Exercise per Session: 40 min  Stress: No Stress Concern Present (01/24/2024)   [employer] of Occupational Health - Occupational Stress Questionnaire    Feeling of  Stress: Only a little  Social Connections: Socially Integrated (01/24/2024)   Social Connection and Isolation Panel    Frequency of Communication with Friends and Family: Twice a week    Frequency of Social Gatherings with Friends and Family: Once a week    Attends Religious Services: More than 4 times per year    Active Member of [employer] or Organizations: Yes    Attends Engineer, structural: More than 4 times per year    Marital Status: Married    Tobacco Counseling Counseling given: Not Answered    Clinical Intake:  Pre-visit preparation completed: Yes  Pain : No/denies pain     BMI - recorded: 28.19 Nutritional Status: BMI 25 -29 Overweight Nutritional Risks: None Diabetes: No  Lab Results  Component Value Date   HGBA1C 5.1 05/14/2016     How often do you need to have someone help you when you read instructions, pamphlets, or other written materials from your doctor or pharmacy?: 1 - Never What is the last grade level you completed in school?: bachelor's  Interpreter Needed?: No  Information entered by :: Lolita Libra, CMA   Activities of Daily Living     01/24/2024   11:54 AM 09/13/2023    3:25 PM  In your present state of health, do you have any difficulty performing the following activities:  Hearing? 0 0  Vision? 0 0  Difficulty concentrating or making decisions? 0 0  Walking or climbing stairs? 0   Dressing or bathing? 0   Doing errands, shopping? 0 0  Preparing Food  and eating ? N   Using the Toilet? N   In the past six months, have you accidently leaked urine? N   Do you have problems with loss of bowel control? N   Managing your Medications? N   Managing your Finances? N   Housekeeping or managing your Housekeeping? N     Patient Care Team: Saguier, Edward, PA-C as PCP - General (Internal Medicine) Vandy Anes, PA-C as Physician Assistant (Physician Assistant) [facility] (Ophthalmology)  I have updated your Care Teams  any recent Medical Services you may have received from other providers in the past year.     Assessment:   This is a routine wellness examination for Jorge [name].  Hearing/Vision screen Hearing Screening - Comments:: Denies hearing difficulties.  Vision Screening - Comments:: Last eye exam  in 2025 with [facility]     Goals Addressed   None    Depression Screen     01/25/2024   11:00 AM 08/21/2022    1:05 PM 02/21/2022    2:08 PM 10/24/2021   11:52 AM 07/02/2021    2:21 PM 01/09/2017    1:22 PM 06/02/2016    7:36 AM  PHQ 2/9 Scores  PHQ - 2 Score 0 0 0 0 0 0 2  PHQ- 9 Score 0  0   0     Fall Risk     01/24/2024   11:54 AM 08/21/2022    1:01 PM 02/21/2022    2:08 PM 10/24/2021   11:52 AM 07/02/2021    2:19 PM  Fall Risk   Falls in the past year? 1 1 0 0 1  Comment accident getting off a boat tripped over curb     Number falls in past yr: 0 0 0 0 0  Injury with Fall? 1 0 0 0 0  Risk for fall due to :  No Fall Risks   History of fall(s)  Follow up Falls evaluation completed Falls evaluation completed  Falls evaluation completed  Falls prevention discussed      Data saved with a previous flowsheet row definition    MEDICARE RISK AT HOME:  Medicare Risk at Home Any stairs in or around the home?: (Patient-Rptd) Yes If so, are there any without handrails?: (Patient-Rptd) No Home free of loose throw rugs in walkways, pet beds, electrical cords, etc?: (Patient-Rptd) No Adequate lighting in your home to reduce risk of falls?: (Patient-Rptd) Yes Life alert?: (Patient-Rptd) No Use of a cane, walker or w/c?: (Patient-Rptd) No Grab bars in the bathroom?: (Patient-Rptd) Yes Shower chair or bench in shower?: (Patient-Rptd) Yes Elevated toilet seat or a handicapped toilet?: (Patient-Rptd) Yes  TIMED UP AND GO:  Was the test performed?  No  Cognitive Function: 6CIT completed        01/25/2024   11:08 AM 08/21/2022    1:10 PM  6CIT Screen  What Year? 0 points 0 points   What month? 0 points 0 points  What time? 0 points 0 points  Count back from 20 0 points 0 points  Months in reverse 0 points 0 points  Repeat phrase 0 points 0 points  Total Score 0 points 0 points    Immunizations Immunization History  Administered Date(s) Administered   DTaP / HiB / IPV 08/19/2017, 10/14/2017, 02/24/2018   Fluad Quad(high Dose 65+) 03/28/2022   Hep A, Unspecified 07/25/2003, 02/09/2004   Hepatitis B, ADULT 08/19/2017, 10/14/2017, 02/24/2018   Influenza, High Dose Seasonal PF 03/28/2021, 03/19/2023  Influenza,inj,Quad PF,6+ Mos 04/11/2016, 04/07/2017, 04/20/2018, 03/29/2019   Influenza-Unspecified 04/11/2016, 04/07/2017, 04/20/2018, 04/27/2018, 03/29/2019, 05/07/2020, 03/28/2022   Moderna Covid-19 Fall Seasonal Vaccine 4yrs & older 06/24/2022, 03/19/2023   Moderna Sars-Covid-2 Vaccination 08/16/2020   PNEUMOCOCCAL CONJUGATE-20 06/24/2022   Pfizer Covid-19 Vaccine Bivalent Booster 79yrs & up 03/28/2021, 11/12/2021   Pneumococcal Conjugate-13 02/18/2017, 08/19/2017, 10/14/2017, 02/24/2018   Pneumococcal Polysaccharide-23 08/30/2018   Respiratory Syncytial Virus Vaccine ,Recomb Aduvanted(Arexvy ) 03/31/2022   Tdap 09/04/2023   Zoster Recombinant(Shingrix ) 01/05/2017, 04/07/2017    Screening Tests Health Maintenance  Topic Date Due   Hepatitis C Screening  Never done   INFLUENZA VACCINE  02/12/2024   Medicare Annual Wellness (AWV)  01/24/2025   Colonoscopy  01/08/2027   DTaP/Tdap/Td (5 - Td or Tdap) 09/03/2033   Pneumococcal Vaccine: 50+ Years  Completed   Hepatitis B Vaccines  Completed   Zoster Vaccines- Shingrix   Completed   HPV VACCINES  Aged Out   Meningococcal B Vaccine  Aged Out   COVID-19 Vaccine  Discontinued    Health Maintenance  Health Maintenance Due  Topic Date Due   Hepatitis C Screening  Never done   Health Maintenance Items Addressed: Will discuss hep C screening at upcoming OV.  All other HM up to date.  Additional  Screening:  Vision Screening: Recommended annual ophthalmology exams for early detection of glaucoma and other disorders of the eye. Would you like a referral to an eye doctor? No    Dental Screening: Recommended annual dental exams for proper oral hygiene  Community Resource Referral / Chronic Care Management: CRR required this visit?  No   CCM required this visit?  No   Plan:    I have personally reviewed and noted the following in the patient's chart:   Medical and social history Use of alcohol, tobacco or illicit drugs  Current medications and supplements including opioid prescriptions. Patient is currently taking opioid prescriptions. Information provided to patient regarding non-opioid alternatives. Patient advised to discuss non-opioid treatment plan with their provider. Pt uses hydrocodone  very sparingly, has not taken in the last year but has on hand in case of flare up. Functional ability and status Nutritional status Physical activity Advanced directives List of other physicians Hospitalizations, surgeries, and ER visits in previous 12 months Vitals Screenings to include cognitive, depression, and falls Referrals and appointments  In addition, I have reviewed and discussed with patient certain preventive protocols, quality metrics, and best practice recommendations. A written personalized care plan for preventive services as well as general preventive health recommendations were provided to patient.   Lolita Libra, CMA   01/25/2024   After Visit Summary: (MyChart) Due to this being a telephonic visit, the after visit summary with patients personalized plan was offered to patient via MyChart   Notes: Nothing significant to report at this time.

## 2024-01-28 ENCOUNTER — Other Ambulatory Visit: Payer: Self-pay | Admitting: Medical

## 2024-01-28 DIAGNOSIS — L03032 Cellulitis of left toe: Secondary | ICD-10-CM

## 2024-02-01 ENCOUNTER — Inpatient Hospital Stay: Attending: Hematology & Oncology

## 2024-02-01 ENCOUNTER — Inpatient Hospital Stay

## 2024-02-01 ENCOUNTER — Encounter: Payer: Self-pay | Admitting: Hematology & Oncology

## 2024-02-01 ENCOUNTER — Inpatient Hospital Stay (HOSPITAL_BASED_OUTPATIENT_CLINIC_OR_DEPARTMENT_OTHER): Admitting: Hematology & Oncology

## 2024-02-01 VITALS — BP 122/73 | HR 66 | Temp 98.2°F | Resp 20 | Ht 67.0 in | Wt 188.0 lb

## 2024-02-01 VITALS — BP 124/89 | HR 63 | Resp 18

## 2024-02-01 DIAGNOSIS — C9000 Multiple myeloma not having achieved remission: Secondary | ICD-10-CM

## 2024-02-01 DIAGNOSIS — Z79899 Other long term (current) drug therapy: Secondary | ICD-10-CM | POA: Diagnosis not present

## 2024-02-01 DIAGNOSIS — Z7901 Long term (current) use of anticoagulants: Secondary | ICD-10-CM | POA: Insufficient documentation

## 2024-02-01 DIAGNOSIS — Z86718 Personal history of other venous thrombosis and embolism: Secondary | ICD-10-CM | POA: Insufficient documentation

## 2024-02-01 DIAGNOSIS — Z9484 Stem cells transplant status: Secondary | ICD-10-CM | POA: Insufficient documentation

## 2024-02-01 LAB — CBC WITH DIFFERENTIAL (CANCER CENTER ONLY)
Abs Immature Granulocytes: 0.01 K/uL (ref 0.00–0.07)
Basophils Absolute: 0 K/uL (ref 0.0–0.1)
Basophils Relative: 1 %
Eosinophils Absolute: 0.1 K/uL (ref 0.0–0.5)
Eosinophils Relative: 2 %
HCT: 42.1 % (ref 39.0–52.0)
Hemoglobin: 14.7 g/dL (ref 13.0–17.0)
Immature Granulocytes: 0 %
Lymphocytes Relative: 46 %
Lymphs Abs: 1.9 K/uL (ref 0.7–4.0)
MCH: 35.1 pg — ABNORMAL HIGH (ref 26.0–34.0)
MCHC: 34.9 g/dL (ref 30.0–36.0)
MCV: 100.5 fL — ABNORMAL HIGH (ref 80.0–100.0)
Monocytes Absolute: 0.4 K/uL (ref 0.1–1.0)
Monocytes Relative: 9 %
Neutro Abs: 1.7 K/uL (ref 1.7–7.7)
Neutrophils Relative %: 42 %
Platelet Count: 140 K/uL — ABNORMAL LOW (ref 150–400)
RBC: 4.19 MIL/uL — ABNORMAL LOW (ref 4.22–5.81)
RDW: 13.1 % (ref 11.5–15.5)
WBC Count: 4.2 K/uL (ref 4.0–10.5)
nRBC: 0 % (ref 0.0–0.2)

## 2024-02-01 LAB — CMP (CANCER CENTER ONLY)
ALT: 23 U/L (ref 0–44)
AST: 25 U/L (ref 15–41)
Albumin: 4.4 g/dL (ref 3.5–5.0)
Alkaline Phosphatase: 43 U/L (ref 38–126)
Anion gap: 9 (ref 5–15)
BUN: 13 mg/dL (ref 8–23)
CO2: 24 mmol/L (ref 22–32)
Calcium: 9.4 mg/dL (ref 8.9–10.3)
Chloride: 105 mmol/L (ref 98–111)
Creatinine: 0.88 mg/dL (ref 0.61–1.24)
GFR, Estimated: >60 mL/min (ref >60)
Glucose, Bld: 100 mg/dL — ABNORMAL HIGH (ref 70–99)
Potassium: 4.2 mmol/L (ref 3.5–5.1)
Sodium: 138 mmol/L (ref 135–145)
Total Bilirubin: 0.8 mg/dL (ref 0.0–1.2)
Total Protein: 6.8 g/dL (ref 6.5–8.1)

## 2024-02-01 LAB — IRON AND IRON BINDING CAPACITY (CC-WL,HP ONLY)
Iron: 129 ug/dL (ref 45–182)
Saturation Ratios: 37 % (ref 17.9–39.5)
TIBC: 349 ug/dL (ref 250–450)
UIBC: 220 ug/dL (ref 117–376)

## 2024-02-01 LAB — FERRITIN: Ferritin: 127 ng/mL (ref 24–336)

## 2024-02-01 MED ORDER — SODIUM CHLORIDE 0.9 % IV SOLN: INTRAVENOUS | Status: DC

## 2024-02-01 MED ORDER — ZOLEDRONIC ACID 4 MG/100ML IV SOLN
4.0000 mg | Freq: Once | INTRAVENOUS | Status: AC
  Administered 2024-02-01: 4 mg via INTRAVENOUS
  Filled 2024-02-01: qty 100

## 2024-02-01 NOTE — Progress Notes (Signed)
 Hematology and Oncology Follow Up Visit  Jorge [name] [id number] 1955-10-16 68 y.o. 02/01/2024   Principle Diagnosis:  IgG Kappa myeloma - Hyperdiploid/+11 DVT of the LEFT and RIGHT leg  Iron deficiency anemia  Current Therapy:   Revlimid  5mg  po q day (21 on/7 off) - start on 04/07/2018  -on hold until further notice starting 09/15/2023 Zometa  4 mg IV q 6 months - next dose is 09/2024  Xarelto  10 mg PO daily IV Iron as needed  -Feraheme  given on 11/29/2018   Interim History:  Jorge [name] is here today for follow-up.  He has wife had a fantastic time on the cruise over to [place].  They went to [place] and then around the [place].  They also went to [place].  They are gone for almost 4 weeks.  Again, the had a wonderful time.  He actually had a bone marrow biopsy.  This was done at [facility].  This was done on 11/25/2023.  The pathology report ([id number]) showed a normocellular bone marrow.  I think bone marrow showed 5% plasma cells.  He had the ClonoSeq test which did show 11 plasma cells per 1 million.  Of note on the bone marrow biopsy he did have a 20q-abnormality.SABRA  He did have NGS studies done.  This did show he had a DNMT3A mutation.  Right now, we are just going to watch him.  His blood counts have looked good.  His left leg is doing a whole lot better.  The wound has healed up quite nicely.  As always, the we will still travel.  I know they have a beach trip planned.  He has had no problems with the left leg that he injured.  This is healed up pretty well.  He had no problems with this on his trip.  He has had no problems with bowels or bladder.  He has had no bleeding.  He is on Xarelto .  I will keep him on low-dose Xarelto .  Overall, I would say his performance status is probably ECOG 0.   Medications:  Allergies as of 02/01/2024       Reactions   Crestor [rosuvastatin Calcium] Other (See Comments)   Achilles tendonitis   Singulair  [montelukast ] Other (See  Comments)   Mood change        Medication List        Accurate as of February 01, 2024  9:35 AM. If you have any questions, ask your nurse or doctor.          acetaminophen  325 MG tablet Commonly known as: TYLENOL  Take 650 mg by mouth every 6 (six) hours as needed.   allopurinol  100 MG tablet Commonly known as: ZYLOPRIM  Take 1 tablet (100 mg total) by mouth daily.   amLODipine  5 MG tablet Commonly known as: NORVASC  TAKE 1 TABLET (5 MG TOTAL) BY MOUTH DAILY.   colchicine  0.6 MG tablet Take 1 tablet (0.6 mg total) by mouth 2 (two) times daily as needed (when having a gout flare).   FISH OIL HIGH POTENCY PO Take by mouth daily.   fluorouracil 5 % cream Commonly known as: EFUDEX Apply 1 Application topically 2 (two) times daily as needed (For rash on head).   gabapentin  300 MG capsule Commonly known as: NEURONTIN  TAKE 1 CAPSULE BY MOUTH TWICE A DAY   HYDROcodone -acetaminophen  5-325 MG tablet Commonly known as: NORCO/VICODIN Take 1 tablet by mouth 3 (three) times daily as needed for severe pain (pain score 7-10).   ipratropium 0.03 %  nasal spray Commonly known as: ATROVENT  PLACE 1-2 SPRAYS INTO BOTH NOSTRILS 2 (TWO) TIMES DAILY AS NEEDED (NASAL DRAINAGE). What changed: when to take this   loperamide  2 MG capsule Commonly known as: IMODIUM  Take 2 mg by mouth as needed for diarrhea or loose stools. What changed: when to take this   valsartan  160 MG tablet Commonly known as: DIOVAN  TAKE 1 TABLET BY MOUTH EVERY DAY   Vitamin D3 250 MCG (10000 UT) capsule Take 10,000 Units by mouth daily.   Xarelto  10 MG Tabs tablet Generic drug: rivaroxaban  TAKE 1 TABLET BY MOUTH EVERY DAY   zolpidem  5 MG tablet Commonly known as: AMBIEN  TAKE 1 TABLET BY MOUTH AT BEDTIME AS NEEDED FOR SLEEP   ZOMETA  IV Inject into the vein every 3 (three) months.        Allergies:  Allergies  Allergen Reactions   Crestor [Rosuvastatin Calcium] Other (See Comments)    Achilles  tendonitis   Singulair  [Montelukast ] Other (See Comments)    Mood change    Past Medical History, Surgical history, Social history, and Family History were reviewed and updated.  Review of Systems: Review of Systems  Constitutional: Negative.   HENT: Negative.   Eyes: Negative.   Respiratory: Negative.   Cardiovascular: Negative.   Gastrointestinal: Negative.   Genitourinary: Negative.   Musculoskeletal: Positive for myalgias.  Skin: Negative.   Neurological: Negative.   Endo/Heme/Allergies: Negative.   Psychiatric/Behavioral: Negative.      Physical Exam:  height is 5' 7 (1.702 m) and weight is 188 lb (85.3 kg). His oral temperature is 98.2 F (36.8 C). His blood pressure is 122/73 and his pulse is 66. His respiration is 20 and oxygen saturation is 98%.   Wt Readings from Last 3 Encounters:  02/01/24 188 lb (85.3 kg)  01/25/24 180 lb (81.6 kg)  11/23/23 182 lb (82.6 kg)    Physical Exam Vitals signs reviewed.  HENT:     Head: Normocephalic and atraumatic.  Eyes:     Pupils: Pupils are equal, round, and reactive to light.  Neck:     Musculoskeletal: Normal range of motion.  Cardiovascular:     Rate and Rhythm: Normal rate and regular rhythm.     Heart sounds: Normal heart sounds.  Pulmonary:     Effort: Pulmonary effort is normal.     Breath sounds: Normal breath sounds.  Abdominal:     General: Bowel sounds are normal.     Palpations: Abdomen is soft.  Musculoskeletal: Normal range of motion.        General: No tenderness or deformity.  Lymphadenopathy:     Cervical: No cervical adenopathy.  Skin:    General: Skin is warm and dry.     Findings: On the left lower leg, he has a vertical wound.  Despite stretches close 25 cm.  It is dressed.  There is no swelling.  There is no surrounding erythema.   Neurological:     Mental Status: He is alert and oriented to person, place, and time.  Psychiatric:        Behavior: Behavior normal.        Thought Content:  Thought content normal.        Judgment: Judgment normal.      Lab Results  Component Value Date   WBC 4.2 02/01/2024   HGB 14.7 02/01/2024   HCT 42.1 02/01/2024   MCV 100.5 (H) 02/01/2024   PLT 140 (L) 02/01/2024   Lab Results  Component  Value Date   FERRITIN 199 10/05/2023   IRON 124 10/05/2023   TIBC 330 10/05/2023   UIBC 206 10/05/2023   IRONPCTSAT 38 10/05/2023   Lab Results  Component Value Date   RETICCTPCT 1.8 08/22/2022   RBC 4.19 (L) 02/01/2024   Lab Results  Component Value Date   KPAFRELGTCHN 23.1 (H) 11/23/2023   LAMBDASER 14.1 11/23/2023   KAPLAMBRATIO 1.64 11/23/2023   Lab Results  Component Value Date   IGGSERUM 1,155 11/23/2023   IGA 394 11/23/2023   IGMSERUM 50 11/23/2023   Lab Results  Component Value Date   TOTALPROTELP 6.7 08/28/2023   ALBUMINELP 3.9 08/28/2023   A1GS 0.2 08/28/2023   A2GS 0.5 08/28/2023   BETS 1.0 08/28/2023   GAMS 1.1 08/28/2023   MSPIKE Not Observed 08/28/2023   SPEI Comment 01/05/2018     Chemistry      Component Value Date/Time   NA 142 11/23/2023 1002   NA 143 06/30/2017 1049   NA 140 10/03/2016 0845   K 5.1 11/23/2023 1002   K 4.0 06/30/2017 1049   K 4.3 10/03/2016 0845   CL 107 11/23/2023 1002   CL 105 06/30/2017 1049   CO2 28 11/23/2023 1002   CO2 27 06/30/2017 1049   CO2 26 10/03/2016 0845   BUN 21 11/23/2023 1002   BUN 13 06/30/2017 1049   BUN 11.9 10/03/2016 0845   CREATININE 0.97 11/23/2023 1002   CREATININE 1.0 06/30/2017 1049   CREATININE 0.9 10/03/2016 0845      Component Value Date/Time   CALCIUM 10.5 (H) 11/23/2023 1002   CALCIUM 9.0 06/30/2017 1049   CALCIUM 9.9 10/03/2016 0845   ALKPHOS 50 11/23/2023 1002   ALKPHOS 57 06/30/2017 1049   ALKPHOS 80 10/03/2016 0845   AST 27 11/23/2023 1002   AST 28 10/03/2016 0845   ALT 29 11/23/2023 1002   ALT 43 06/30/2017 1049   ALT 26 10/03/2016 0845   BILITOT 0.7 11/23/2023 1002   BILITOT 0.48 10/03/2016 0845      Impression and Plan:  Jorge [name] is a very nice 68 yo gentleman with IgG kappa myeloma.  He ultimately underwent an autologous stem cell transplant at [facility] in February 2018.  He had been on maintenance Revlimid .   I am glad that the bone marrow test did come out good.  I guess that really should not be always surprised about the MRD status.  It is very very low.  I would suspect that if he needed treatment again, he will consider CAR-T therapy.  He will get his Zometa  today.  I will then plan to get him back in another 3 months.  We will always get him back sooner if necessary.    Maude JONELLE Crease, MD 7/21/20259:35 AM

## 2024-02-01 NOTE — Patient Instructions (Signed)

## 2024-02-02 LAB — KAPPA/LAMBDA LIGHT CHAINS
Kappa free light chain: 26 mg/L — ABNORMAL HIGH (ref 3.3–19.4)
Kappa, lambda light chain ratio: 1.55 (ref 0.26–1.65)
Lambda free light chains: 16.8 mg/L (ref 5.7–26.3)

## 2024-02-02 LAB — PROTEIN ELECTROPHORESIS, SERUM, WITH REFLEX
A/G Ratio: 1.5 (ref 0.7–1.7)
Albumin ELP: 3.9 g/dL (ref 2.9–4.4)
Alpha-1-Globulin: 0.2 g/dL (ref 0.0–0.4)
Alpha-2-Globulin: 0.5 g/dL (ref 0.4–1.0)
Beta Globulin: 0.9 g/dL (ref 0.7–1.3)
Gamma Globulin: 1 g/dL (ref 0.4–1.8)
Globulin, Total: 2.6 g/dL (ref 2.2–3.9)
Total Protein ELP: 6.5 g/dL (ref 6.0–8.5)

## 2024-02-02 LAB — IGG, IGA, IGM
IgA: 387 mg/dL (ref 61–437)
IgG (Immunoglobin G), Serum: 1086 mg/dL (ref 603–1613)
IgM (Immunoglobulin M), Srm: 47 mg/dL (ref 20–172)

## 2024-02-17 ENCOUNTER — Encounter: Payer: Self-pay | Admitting: Hematology & Oncology

## 2024-02-17 ENCOUNTER — Encounter: Payer: Self-pay | Admitting: Medical

## 2024-03-15 ENCOUNTER — Ambulatory Visit (INDEPENDENT_AMBULATORY_CARE_PROVIDER_SITE_OTHER): Admitting: Medical

## 2024-03-15 ENCOUNTER — Ambulatory Visit: Payer: Self-pay | Admitting: Medical

## 2024-03-15 VITALS — BP 118/78 | HR 65 | Temp 97.8°F | Resp 16 | Ht 67.0 in | Wt 185.6 lb

## 2024-03-15 DIAGNOSIS — Z1159 Encounter for screening for other viral diseases: Secondary | ICD-10-CM | POA: Diagnosis not present

## 2024-03-15 DIAGNOSIS — R739 Hyperglycemia, unspecified: Secondary | ICD-10-CM | POA: Diagnosis not present

## 2024-03-15 DIAGNOSIS — D696 Thrombocytopenia, unspecified: Secondary | ICD-10-CM | POA: Diagnosis not present

## 2024-03-15 LAB — HEMOGLOBIN A1C: Hgb A1c MFr Bld: 5.7 % (ref 4.6–6.5)

## 2024-03-15 NOTE — Patient Instructions (Signed)
 Multiple myeloma, status post Revlimid , monitoring for immunosuppression Multiple myeloma treated with Revlimid , ceased six months ago. Monitoring for immunosuppression due to recent and upcoming international travel. - Send message to Dr. Timmy regarding immunosuppression status and travel plans. - Consider two doses of a vaccine recommended for international travelers, pending Dr. Timmy approval. - Perform CBC to monitor white blood cell count and platelets.  Thrombocytopenia Chronic thrombocytopenia with recent platelet count of 140, slightly below normal range. No indication of blood thinners contributing to low platelet count. - Monitor platelets count with CBC.  Hypertension, controlled on valsartan  and amlodipine  Hypertension well-controlled with valsartan  160 mg daily and amlodipine . Recent blood pressure reading of 118/78 mmHg.  Insomnia, intermittent use of Ambien  Intermittent insomnia managed with rare use of Ambien , approximately 5-10 mg every ten days. - Continue current Ambien  usage as needed. - Refill Ambien  prescription as requested.  Hyperglycemia, monitoring with A1c Previous hyperglycemia with slightly elevated blood sugar levels noted in the summer. Kidney function tests were normal. - Order A1c test to assess current glycemic control.  Follow up date to be determined after lab review.

## 2024-03-15 NOTE — Progress Notes (Signed)
 Subjective:    Patient ID: Jorge [name], male    DOB: 1955-11-27, 68 y.o.   MRN: [id number]  HPI Brodyn Depuy is a 68 year old male with multiple myeloma who presents for a routine follow-up and travel-related health advice.  He has a history of multiple myeloma and has been off Revlimid  for about six months since February. He is concerned about his health while traveling internationally, particularly regarding vaccinations and potential exposure to infectious diseases.  He is planning to travel to [place] in mid-October and [place]  in late December. He inquires about the availability of the flu vaccine and test. He is up to date on his tetanus vaccination and has been vaccinated against measles but as youth(concerned immunity has waned). He is concerned about mosquito bites despite using insect repellent, as he is currently nursing a few bites.  He is currently taking valsartan  160 mg daily and amlodipine  for blood pressure management. He occasionally uses Ambien , taking 5 to 10 mg every ten days for insomnia. His last metabolic panel showed slightly elevated blood sugar but normal kidney function.  He is not on cholesterol medication, and his lipid panel showed a total cholesterol of 127 mg/dL and LDL of 47 mg/dL. His platelets tend to be low, with a recent count of 140,000/L, and he questions if his blood thinner could be affecting this, though he does not believe so.  Occasional insomnia and low platelet counts. He has been traveling extensively this year, including trips to [place] and [place].         Review of Systems  Constitutional:  Negative for chills and fever.  HENT:  Negative for congestion and ear pain.   Respiratory:  Negative for chest tightness, shortness of breath and wheezing.   Cardiovascular:  Negative for chest pain and palpitations.  Gastrointestinal:  Negative for abdominal pain, diarrhea and vomiting.  Genitourinary:  Negative for dysuria,  frequency and urgency.  Musculoskeletal:  Negative for back pain and neck pain.  Skin:  Negative for rash.  Neurological:  Negative for dizziness, weakness and numbness.  Hematological:  Negative for adenopathy.  Psychiatric/Behavioral:  Negative for behavioral problems and suicidal ideas. The patient is not hyperactive.     Past Medical History:  Diagnosis Date   Allergy    Anxiety    Asthma    Blood transfusion without reported diagnosis 2018   Bone metastasis    Chest cold 05/19/2016   productive cough  -- started on antibiotic   Chronic back pain    due to bone mets from myeloma   Cough    Depression    Diverticulitis    GERD (gastroesophageal reflux disease)    Hiatal hernia    History of chicken pox    History of concussion    age 36 -- no residual   History of DVT of lower extremity 03/21/2016  treated and completed w/ xarelto    per doppler left extensive occlusion common femoral, femoral, and popliteal veins and right partial occlusion common femoral and profunda femoral veins/  last doppler 06-04-2016 no evidence acute or chronic dvt noted either leg    History of radiation therapy 06/09/16-06/23/16   lower thoracic spine 25 Gy in 10 fractions   Mouth ulcers    secondary to radiation   Multiple myeloma (HCC) dx 02/22/2016 via bone marrow bx---  oncologist-  dr timmy   IgG Kappa-- Hyperdiploid/ +11 w/ bone mets--  current treatment chemotherapy (started 08/ 2017)and pallitive radiation  to back started 06-09-2016   Renal calculus, right    Wears contact lenses      Social History   Socioeconomic History   Marital status: Married    Spouse name: Not on file   Number of children: 3   Years of education: Not on file   Highest education level: Bachelor's degree (e.g., BA, AB, BS)  Occupational History   Not on file  Tobacco Use   Smoking status: Former    Current packs/day: 0.00    Average packs/day: 1 pack/day for 7.0 years (7.0 ttl pk-yrs)    Types:  Cigarettes    Start date: 11/02/1973    Quit date: 11/02/1980    Years since quitting: 43.3   Smokeless tobacco: Never  Vaping Use   Vaping status: Never Used  Substance and Sexual Activity   Alcohol use: Yes    Comment: occasional   Drug use: No   Sexual activity: Not Currently  Other Topics Concern   Not on file  Social History Narrative   Not on file   Social Drivers of Health   Financial Resource Strain: Low Risk  (01/24/2024)   Overall Financial Resource Strain (CARDIA)    Difficulty of Paying Living Expenses: Not hard at all  Food Insecurity: No Food Insecurity (01/24/2024)   Hunger Vital Sign    Worried About Running Out of Food in the Last Year: Never true    Ran Out of Food in the Last Year: Never true  Transportation Needs: No Transportation Needs (01/24/2024)   PRAPARE - Administrator, Civil Service (Medical): No    Lack of Transportation (Non-Medical): No  Physical Activity: Sufficiently Active (01/24/2024)   Exercise Vital Sign    Days of Exercise per Week: 6 days    Minutes of Exercise per Session: 40 min  Stress: No Stress Concern Present (01/24/2024)   [employer] of Occupational Health - Occupational Stress Questionnaire    Feeling of Stress: Only a little  Social Connections: Socially Integrated (01/24/2024)   Social Connection and Isolation Panel    Frequency of Communication with Friends and Family: Twice a week    Frequency of Social Gatherings with Friends and Family: Once a week    Attends Religious Services: More than 4 times per year    Active Member of Clubs or Organizations: Yes    Attends Banker Meetings: More than 4 times per year    Marital Status: Married  Catering manager Violence: Not At Risk (01/25/2024)   Humiliation, Afraid, Rape, and Kick questionnaire    Fear of Current or Ex-Partner: No    Emotionally Abused: No    Physically Abused: No    Sexually Abused: No    Past Surgical History:  Procedure  Laterality Date   COLONOSCOPY  2008,2013   COLONOSCOPY  01/07/2017   Dr Lennard, [facility]   CYSTOSCOPY W/ URETERAL STENT PLACEMENT Right 06/20/2016   Procedure: CYSTOSCOPY WITH STENT REPLACEMENT;  Surgeon: Mark Ottelin, MD;  Location: [facility];  Service: Urology;  Laterality: Right;   CYSTOSCOPY WITH RETROGRADE PYELOGRAM, URETEROSCOPY AND STENT PLACEMENT Right 05/30/2016   Procedure: CYSTOSCOPY WITH RETROGRADE PYELOGRAM, URETEROSCOPY AND STENT PLACEMENT,DILITATION URETERAL STRICTURE;  Surgeon: Mark Ottelin, MD;  Location: [facility];  Service: Urology;  Laterality: Right;   CYSTOSCOPY/RETROGRADE/URETEROSCOPY/STONE EXTRACTION WITH BASKET Right 06/20/2016   Procedure: CYSTOSCOPY/URETEROSCOPY/STONE EXTRACTION WITH BASKET;  Surgeon: Mark Ottelin, MD;  Location: [facility];  Service: Urology;  Laterality: Right;  HIATAL HERNIA REPAIR N/A 08/13/2018   Procedure: LAPAROSCOPIC REPAIR OF HIATAL HERNIA REPAIR WITH FUNDOPLICATION AND INSERTION OF MESH;  Surgeon: Mikell Katz, MD;  Location: [facility];  Service: General;  Laterality: N/A;   HOLMIUM LASER APPLICATION Right 06/20/2016   Procedure: HOLMIUM LASER APPLICATION;  Surgeon: Mark Ottelin, MD;  Location: [facility];  Service: Urology;  Laterality: Right;   IR GENERIC HISTORICAL  02/11/2016   IR RADIOLOGIST EVAL & MGMT 02/11/2016 [facility]   IR GENERIC HISTORICAL  02/15/2016   IR BONE TUMOR(S)RF ABLATION 02/15/2016 Thyra Nash, MD [facility]   IR GENERIC HISTORICAL  02/15/2016   IR BONE TUMOR(S)RF ABLATION 02/15/2016 Thyra Nash, MD [facility]   IR GENERIC HISTORICAL  02/15/2016   IR BONE TUMOR(S)RF ABLATION 02/15/2016 Thyra Nash, MD [facility]   IR GENERIC HISTORICAL  02/15/2016   IR KYPHO THORACIC WITH BONE BIOPSY 02/15/2016 Thyra Nash, MD [facility]   IR GENERIC HISTORICAL  02/15/2016   IR KYPHO THORACIC WITH BONE BIOPSY 02/15/2016 Thyra Nash, MD [facility]   IR GENERIC  HISTORICAL  02/15/2016   IR VERTEBROPLASTY CERV/THOR BX INC UNI/BIL INC/INJECT/IMAGING 02/15/2016 Thyra Nash, MD [facility]   IR GENERIC HISTORICAL  03/13/2016   IR KYPHO EA ADDL LEVEL THORACIC OR LUMBAR 03/13/2016 Thyra Nash, MD [facility]   IR GENERIC HISTORICAL  03/13/2016   IR KYPHO EA ADDL LEVEL THORACIC OR LUMBAR 03/13/2016 Thyra Nash, MD [facility]   IR GENERIC HISTORICAL  03/13/2016   IR BONE TUMOR(S)RF ABLATION 03/13/2016 Thyra Nash, MD [facility]   IR GENERIC HISTORICAL  03/13/2016   IR KYPHO LUMBAR INC FX REDUCE BONE BX UNI/BIL CANNULATION INC/IMAGING 03/13/2016 Thyra Nash, MD [facility]   IR GENERIC HISTORICAL  03/13/2016   IR BONE TUMOR(S)RF ABLATION 03/13/2016 Thyra Nash, MD [facility]   IR GENERIC HISTORICAL  03/13/2016   IR BONE TUMOR(S)RF ABLATION 03/13/2016 Thyra Nash, MD [facility]   IR GENERIC HISTORICAL  03/31/2016   IR RADIOLOGIST EVAL & MGMT 03/31/2016 [facility]   KYPHOPLASTY     02/2016, 03/2016, 11/2016   LAPAROSCOPIC INGUINAL HERNIA REPAIR Bilateral 12-16-2013  dr gross   RADIOLOGY WITH ANESTHESIA N/A 02/15/2016   Procedure: Spinal Ablation;  Surgeon: Thyra Nash, MD;  Location[facility];  Service: Radiology;  Laterality: N/A;   RADIOLOGY WITH ANESTHESIA N/A 03/13/2016   Procedure: LUMBER ABLATION;  Surgeon: Thyra Nash, MD;  Location: [facility];  Service: Radiology;  Laterality: N/A;   ROTATOR CUFF REPAIR Right 2003   spine operation     x1 in 2018 x3 in 2017   TONSILLECTOMY  age 16   VERTEBROPLASTY  01/2016   WISDOM TOOTH EXTRACTION      Family History  Problem Relation Age of Onset   Uterine cancer Mother    Ovarian cancer Mother    Colon cancer Mother        said it was rectal or colon but not sure   Rectal cancer Mother    Heart disease Father    Hypertension Father    Multiple sclerosis Sister    Paranoid behavior Brother    Drug abuse Brother    Schizophrenia Brother    Stroke Maternal  Grandfather    Cancer Maternal Aunt    Leukemia Paternal Aunt    Healthy Son        x1   Healthy Daughter        x2   Allergies Daughter  x1   Diabetes Neg Hx    Alzheimer's disease Neg Hx    Parkinson's disease Neg Hx    Esophageal cancer Neg Hx    Stomach cancer Neg Hx     Allergies  Allergen Reactions   Crestor [Rosuvastatin Calcium] Other (See Comments)    Achilles tendonitis   Singulair  [Montelukast ] Other (See Comments)    Mood change    Current Outpatient Medications on File Prior to Visit  Medication Sig Dispense Refill   acetaminophen  (TYLENOL ) 325 MG tablet Take 650 mg by mouth every 6 (six) hours as needed.     allopurinol  (ZYLOPRIM ) 100 MG tablet Take 1 tablet (100 mg total) by mouth daily. 90 tablet 0   amLODipine  (NORVASC ) 5 MG tablet TAKE 1 TABLET (5 MG TOTAL) BY MOUTH DAILY. 90 tablet 0   Cholecalciferol  (VITAMIN D3) 250 MCG (10000 UT) capsule Take 10,000 Units by mouth daily.     colchicine  0.6 MG tablet Take 1 tablet (0.6 mg total) by mouth 2 (two) times daily as needed (when having a gout flare). 30 tablet 0   fluorouracil (EFUDEX) 5 % cream Apply 1 Application topically 2 (two) times daily as needed (For rash on head).     gabapentin  (NEURONTIN ) 300 MG capsule TAKE 1 CAPSULE BY MOUTH TWICE A DAY 60 capsule 11   HYDROcodone -acetaminophen  (NORCO/VICODIN) 5-325 MG tablet Take 1 tablet by mouth 3 (three) times daily as needed for severe pain (pain score 7-10).     ipratropium (ATROVENT ) 0.03 % nasal spray PLACE 1-2 SPRAYS INTO BOTH NOSTRILS 2 (TWO) TIMES DAILY AS NEEDED (NASAL DRAINAGE). (Patient taking differently: Place 1-2 sprays into both nostrils 2 (two) times daily.) 30 mL 1   loperamide  (IMODIUM ) 2 MG capsule Take 2 mg by mouth as needed for diarrhea or loose stools. (Patient taking differently: Take 2 mg by mouth daily.)     Omega-3 Fatty Acids (FISH OIL HIGH POTENCY PO) Take by mouth daily.     valsartan  (DIOVAN ) 160 MG tablet TAKE 1 TABLET BY MOUTH  EVERY DAY 30 tablet 11   XARELTO  10 MG TABS tablet TAKE 1 TABLET BY MOUTH EVERY DAY 90 tablet 3   Zoledronic  Acid (ZOMETA  IV) Inject into the vein every 3 (three) months.     zolpidem  (AMBIEN ) 5 MG tablet TAKE 1 TABLET BY MOUTH AT BEDTIME AS NEEDED FOR SLEEP 30 tablet 1   Current Facility-Administered Medications on File Prior to Visit  Medication Dose Route Frequency Provider Last Rate Last Admin   heparin  lock flush 100 unit/mL  500 Units Intravenous Once Franchot Lauraine HERO, NP       sodium chloride  flush (NS) 0.9 % injection 3 mL  3 mL Intravenous Once PRN Franchot Lauraine HERO, NP        BP 118/78   Pulse 65   Temp 97.8 F (36.6 C) (Oral)   Resp 16   Ht 5' 7 (1.702 m)   Wt 185 lb 9.6 oz (84.2 kg)   SpO2 95%   BMI 29.07 kg/m        Objective:   Physical Exam   General Mental Status- Alert. General Appearance- Not in acute distress.   Skin General: Color- Normal Color. Moisture- Normal Moisture.  Neck Carotid Arteries- Normal color. Moisture- Normal Moisture. No carotid bruits. No JVD.  Chest and Lung Exam Auscultation: Breath Sounds:-CTA  Cardiovascular Auscultation:Rythm- RRR Murmurs & Other Heart Sounds:Auscultation of the heart reveals- No Murmurs.  Abdomen Inspection:-Inspeection Normal. Palpation/Percussion:Note:No mass. Palpation and  Percussion of the abdomen reveal- Non Tender, Non Distended + BS, no rebound or guarding.   Neurologic Cranial Nerve exam:- CN III-XII intact(No nystagmus), symmetric smile. Strength:- 5/5 equal and symmetric strength both upper and lower extremities.      Assessment & Plan:   Patient Instructions  Multiple myeloma, status post Revlimid , monitoring for immunosuppression Multiple myeloma treated with Revlimid , ceased six months ago. Monitoring for immunosuppression due to recent and upcoming international travel. - Send message to Dr. Timmy regarding immunosuppression status and travel plans. - Consider two doses of a  vaccine recommended for international travelers, pending Dr. Timmy approval. - Perform CBC to monitor white blood cell count and platelets.  Thrombocytopenia Chronic thrombocytopenia with recent platelet count of 140, slightly below normal range. No indication of blood thinners contributing to low platelet count. - Monitor platelets count with CBC.  Hypertension, controlled on valsartan  and amlodipine  Hypertension well-controlled with valsartan  160 mg daily and amlodipine . Recent blood pressure reading of 118/78 mmHg.  Insomnia, intermittent use of Ambien  Intermittent insomnia managed with rare use of Ambien , approximately 5-10 mg every ten days. - Continue current Ambien  usage as needed. - Refill Ambien  prescription as requested.  Hyperglycemia, monitoring with A1c Previous hyperglycemia with slightly elevated blood sugar levels noted in the summer. Kidney function tests were normal. - Order A1c test to assess current glycemic control.  Follow up date to be determined after lab review.   Lyle Niblett, PA-C

## 2024-03-15 NOTE — Telephone Encounter (Signed)
 Dr. Timmy,  Pt has question on mmr vaccine. He has upcoming trip to [place]. He does internation travel periodically. He want to know if he can get mmr vaccine. He has not done so in past since his wbc were down and he was on revlimid . He has been off since February. Would you advise/give ok to use. Will get cbc today.  [place]

## 2024-03-16 LAB — HEPATITIS C ANTIBODY: Hepatitis C Ab: NONREACTIVE

## 2024-03-17 ENCOUNTER — Ambulatory Visit (INDEPENDENT_AMBULATORY_CARE_PROVIDER_SITE_OTHER)

## 2024-03-17 ENCOUNTER — Telehealth: Payer: Self-pay

## 2024-03-17 DIAGNOSIS — Z23 Encounter for immunization: Secondary | ICD-10-CM | POA: Diagnosis not present

## 2024-03-17 NOTE — Telephone Encounter (Signed)
 Copied from CRM [id number]. Topic: Clinical - Request for Lab/Test Order >> Mar 16, 2024  4:51 PM Sophia H wrote: Reason for CRM: Patient states he was recently approved to get MMR vaccine by another doctor (he is a cancer patient) and is requesting orders to get this taken care of. Please place orders and reach out to patient for scheduling, did not see orders in chart. Ty  # [id number]

## 2024-03-17 NOTE — Progress Notes (Signed)
 Patient here today for MMR vaccine per Edward Saguier,PA-C   Patient tolerated immunization well

## 2024-03-17 NOTE — Telephone Encounter (Signed)
 Verbal orders  Called pt to get him scheduled for MMR vaccine with no answer Left voicemail for him to call the office back to get scheduled for a nurse visit

## 2024-03-28 ENCOUNTER — Encounter: Payer: Self-pay | Admitting: Medical

## 2024-04-04 ENCOUNTER — Inpatient Hospital Stay: Attending: Hematology & Oncology

## 2024-04-05 ENCOUNTER — Ambulatory Visit: Admitting: Medical

## 2024-04-24 ENCOUNTER — Other Ambulatory Visit: Payer: Self-pay | Admitting: Medical

## 2024-04-24 DIAGNOSIS — L03032 Cellulitis of left toe: Secondary | ICD-10-CM

## 2024-04-25 ENCOUNTER — Inpatient Hospital Stay: Admitting: Hematology & Oncology

## 2024-04-25 ENCOUNTER — Inpatient Hospital Stay: Attending: Hematology & Oncology

## 2024-04-25 ENCOUNTER — Encounter: Payer: Self-pay | Admitting: Hematology & Oncology

## 2024-04-25 ENCOUNTER — Other Ambulatory Visit: Payer: Self-pay

## 2024-04-25 VITALS — BP 125/80 | HR 66 | Temp 98.0°F | Resp 17 | Ht 67.0 in | Wt 186.0 lb

## 2024-04-25 DIAGNOSIS — Z86718 Personal history of other venous thrombosis and embolism: Secondary | ICD-10-CM | POA: Diagnosis not present

## 2024-04-25 DIAGNOSIS — Z7901 Long term (current) use of anticoagulants: Secondary | ICD-10-CM | POA: Diagnosis not present

## 2024-04-25 DIAGNOSIS — Z9484 Stem cells transplant status: Secondary | ICD-10-CM | POA: Diagnosis not present

## 2024-04-25 DIAGNOSIS — C9000 Multiple myeloma not having achieved remission: Secondary | ICD-10-CM | POA: Diagnosis not present

## 2024-04-25 DIAGNOSIS — D509 Iron deficiency anemia, unspecified: Secondary | ICD-10-CM | POA: Diagnosis not present

## 2024-04-25 LAB — CMP (CANCER CENTER ONLY)
ALT: 41 U/L (ref 0–44)
AST: 38 U/L (ref 15–41)
Albumin: 4.7 g/dL (ref 3.5–5.0)
Alkaline Phosphatase: 56 U/L (ref 38–126)
Anion gap: 12 (ref 5–15)
BUN: 14 mg/dL (ref 8–23)
CO2: 25 mmol/L (ref 22–32)
Calcium: 9.5 mg/dL (ref 8.9–10.3)
Chloride: 105 mmol/L (ref 98–111)
Creatinine: 0.86 mg/dL (ref 0.61–1.24)
GFR, Estimated: >60 mL/min (ref >60)
Glucose, Bld: 95 mg/dL (ref 70–99)
Potassium: 4.3 mmol/L (ref 3.5–5.1)
Sodium: 141 mmol/L (ref 135–145)
Total Bilirubin: 0.5 mg/dL (ref 0.0–1.2)
Total Protein: 7.5 g/dL (ref 6.5–8.1)

## 2024-04-25 LAB — CBC WITH DIFFERENTIAL (CANCER CENTER ONLY)
Abs Immature Granulocytes: 0.01 K/uL (ref 0.00–0.07)
Basophils Absolute: 0 K/uL (ref 0.0–0.1)
Basophils Relative: 1 %
Eosinophils Absolute: 0.1 K/uL (ref 0.0–0.5)
Eosinophils Relative: 3 %
HCT: 44.5 % (ref 39.0–52.0)
Hemoglobin: 15.3 g/dL (ref 13.0–17.0)
Immature Granulocytes: 0 %
Lymphocytes Relative: 49 %
Lymphs Abs: 2 K/uL (ref 0.7–4.0)
MCH: 34.4 pg — ABNORMAL HIGH (ref 26.0–34.0)
MCHC: 34.4 g/dL (ref 30.0–36.0)
MCV: 100 fL (ref 80.0–100.0)
Monocytes Absolute: 0.3 K/uL (ref 0.1–1.0)
Monocytes Relative: 8 %
Neutro Abs: 1.6 K/uL — ABNORMAL LOW (ref 1.7–7.7)
Neutrophils Relative %: 39 %
Platelet Count: 145 K/uL — ABNORMAL LOW (ref 150–400)
RBC: 4.45 MIL/uL (ref 4.22–5.81)
RDW: 13 % (ref 11.5–15.5)
WBC Count: 4.1 K/uL (ref 4.0–10.5)
nRBC: 0 % (ref 0.0–0.2)

## 2024-04-25 LAB — IRON AND IRON BINDING CAPACITY (CC-WL,HP ONLY)
Iron: 111 ug/dL (ref 45–182)
Saturation Ratios: 29 % (ref 17.9–39.5)
TIBC: 385 ug/dL (ref 250–450)
UIBC: 274 ug/dL

## 2024-04-25 LAB — LACTATE DEHYDROGENASE: LDH: 206 U/L — ABNORMAL HIGH (ref 98–192)

## 2024-04-25 LAB — FERRITIN: Ferritin: 147 ng/mL (ref 24–336)

## 2024-04-25 NOTE — Progress Notes (Signed)
 Hematology and Oncology Follow Up Visit  Jorge [name] [id number] [birth year] 68 y.o. 04/25/2024   Principle Diagnosis:  IgG Kappa myeloma - Hyperdiploid/+11 DVT of the LEFT and RIGHT leg  Iron deficiency anemia  Current Therapy:   Revlimid  5mg  po q day (21 on/7 off) - start on 04/07/2018  -on hold until further notice starting 09/15/2023 Zometa  4 mg IV q 6 months - next dose is 07/2024  Xarelto  10 mg PO daily IV Iron as needed  -Feraheme  given on 11/29/2018   Interim History:  [name] is here today for follow-up.  He comes in with his wife.  As always, they are doing well.  That a very nice weekend.  Her side of the family had a nice get together.  On Thursday, they will be flying down to [place].  I am sure that they will have a wonderful time down there.  I told him to make sure he takes Pepto-Bismol and ciprofloxacin  in case he runs into travelers diarrhea.  When we last saw him, there was no monoclonal spike in his blood.  His IgG level was 1086 mg/dL.  The Kappa light chain was 2.6 mg/dL.  All this is stable.  He has had no issues with fever.  He has had no bleeding.  The left leg has finally healed up where he injured it out in [place] .  This really took quite a while.  Thankfully, it was not worse and that was for him.  He has had no headache.  He has had no mouth sores.  He has had no leg swelling.  He continues on the low-dose Xarelto .  Everything so far is doing well with the Xarelto .  Overall, I would say that his performance status is probably ECOG 0.    She is Medications:  Allergies as of 04/25/2024       Reactions   Crestor [rosuvastatin Calcium] Other (See Comments)   Achilles tendonitis   Singulair  [montelukast ] Other (See Comments)   Mood change        Medication List        Accurate as of April 25, 2024  9:40 AM. If you have any questions, ask your nurse or doctor.          acetaminophen  325 MG tablet Commonly known as:  TYLENOL  Take 650 mg by mouth every 6 (six) hours as needed.   Align 10 MG Caps Take 1 capsule by mouth daily.   allopurinol  100 MG tablet Commonly known as: ZYLOPRIM  TAKE 1 TABLET BY MOUTH EVERY DAY   amLODipine  5 MG tablet Commonly known as: NORVASC  TAKE 1 TABLET (5 MG TOTAL) BY MOUTH DAILY.   colchicine  0.6 MG tablet Take 1 tablet (0.6 mg total) by mouth 2 (two) times daily as needed (when having a gout flare).   colestipol 1 g tablet Commonly known as: COLESTID Take 1 g by mouth daily.   FISH OIL HIGH POTENCY PO Take by mouth daily.   fluorouracil 5 % cream Commonly known as: EFUDEX Apply 1 Application topically 2 (two) times daily as needed (For rash on head).   gabapentin  300 MG capsule Commonly known as: NEURONTIN  TAKE 1 CAPSULE BY MOUTH TWICE A DAY   HYDROcodone -acetaminophen  5-325 MG tablet Commonly known as: NORCO/VICODIN Take 1 tablet by mouth 3 (three) times daily as needed for severe pain (pain score 7-10).   ipratropium 0.03 % nasal spray Commonly known as: ATROVENT  PLACE 1-2 SPRAYS INTO BOTH NOSTRILS 2 (TWO) TIMES DAILY AS  NEEDED (NASAL DRAINAGE). What changed: when to take this   loperamide  2 MG capsule Commonly known as: IMODIUM  Take 2 mg by mouth as needed for diarrhea or loose stools. What changed: when to take this   valsartan  160 MG tablet Commonly known as: DIOVAN  TAKE 1 TABLET BY MOUTH EVERY DAY   Vitamin D3 250 MCG (10000 UT) capsule Take 10,000 Units by mouth daily.   Xarelto  10 MG Tabs tablet Generic drug: rivaroxaban  TAKE 1 TABLET BY MOUTH EVERY DAY   zolpidem  5 MG tablet Commonly known as: AMBIEN  TAKE 1 TABLET BY MOUTH AT BEDTIME AS NEEDED FOR SLEEP   ZOMETA  IV Inject into the vein every 3 (three) months.        Allergies:  Allergies  Allergen Reactions   Crestor [Rosuvastatin Calcium] Other (See Comments)    Achilles tendonitis   Singulair  [Montelukast ] Other (See Comments)    Mood change    Past Medical History,  Surgical history, Social history, and Family History were reviewed and updated.  Review of Systems: Review of Systems  Constitutional: Negative.   HENT: Negative.   Eyes: Negative.   Respiratory: Negative.   Cardiovascular: Negative.   Gastrointestinal: Negative.   Genitourinary: Negative.   Musculoskeletal: Positive for myalgias.  Skin: Negative.   Neurological: Negative.   Endo/Heme/Allergies: Negative.   Psychiatric/Behavioral: Negative.      Physical Exam:  height is 5' 7 (1.702 m) and weight is 186 lb (84.4 kg). His oral temperature is 98 F (36.7 C). His blood pressure is 125/80 and his pulse is 66. His respiration is 17 and oxygen saturation is 98%.   Wt Readings from Last 3 Encounters:  04/25/24 186 lb (84.4 kg)  03/15/24 185 lb 9.6 oz (84.2 kg)  02/01/24 188 lb (85.3 kg)    Physical Exam Vitals signs reviewed.  HENT:     Head: Normocephalic and atraumatic.  Eyes:     Pupils: Pupils are equal, round, and reactive to light.  Neck:     Musculoskeletal: Normal range of motion.  Cardiovascular:     Rate and Rhythm: Normal rate and regular rhythm.     Heart sounds: Normal heart sounds.  Pulmonary:     Effort: Pulmonary effort is normal.     Breath sounds: Normal breath sounds.  Abdominal:     General: Bowel sounds are normal.     Palpations: Abdomen is soft.  Musculoskeletal: Normal range of motion.        General: No tenderness or deformity.  Lymphadenopathy:     Cervical: No cervical adenopathy.  Skin:    General: Skin is warm and dry.     Findings: On the left lower leg, he has a vertical wound.  Despite stretches close 25 cm.  It is dressed.  There is no swelling.  There is no surrounding erythema.   Neurological:     Mental Status: He is alert and oriented to person, place, and time.  Psychiatric:        Behavior: Behavior normal.        Thought Content: Thought content normal.        Judgment: Judgment normal.      Lab Results  Component Value  Date   WBC 4.1 04/25/2024   HGB 15.3 04/25/2024   HCT 44.5 04/25/2024   MCV 100.0 04/25/2024   PLT 145 (L) 04/25/2024   Lab Results  Component Value Date   FERRITIN 127 02/01/2024   IRON 129 02/01/2024   TIBC 349  02/01/2024   UIBC 220 02/01/2024   IRONPCTSAT 37 02/01/2024   Lab Results  Component Value Date   RETICCTPCT 1.8 08/22/2022   RBC 4.45 04/25/2024   Lab Results  Component Value Date   KPAFRELGTCHN 26.0 (H) 02/01/2024   LAMBDASER 16.8 02/01/2024   KAPLAMBRATIO 1.55 02/01/2024   Lab Results  Component Value Date   IGGSERUM 1,086 02/01/2024   IGA 387 02/01/2024   IGMSERUM 47 02/01/2024   Lab Results  Component Value Date   TOTALPROTELP 6.5 02/01/2024   ALBUMINELP 3.9 02/01/2024   A1GS 0.2 02/01/2024   A2GS 0.5 02/01/2024   BETS 0.9 02/01/2024   GAMS 1.0 02/01/2024   MSPIKE Not Observed 02/01/2024   SPEI Comment 01/05/2018     Chemistry      Component Value Date/Time   NA 138 02/01/2024 0903   NA 143 06/30/2017 1049   NA 140 10/03/2016 0845   K 4.2 02/01/2024 0903   K 4.0 06/30/2017 1049   K 4.3 10/03/2016 0845   CL 105 02/01/2024 0903   CL 105 06/30/2017 1049   CO2 24 02/01/2024 0903   CO2 27 06/30/2017 1049   CO2 26 10/03/2016 0845   BUN 13 02/01/2024 0903   BUN 13 06/30/2017 1049   BUN 11.9 10/03/2016 0845   CREATININE 0.88 02/01/2024 0903   CREATININE 1.0 06/30/2017 1049   CREATININE 0.9 10/03/2016 0845      Component Value Date/Time   CALCIUM 9.4 02/01/2024 0903   CALCIUM 9.0 06/30/2017 1049   CALCIUM 9.9 10/03/2016 0845   ALKPHOS 43 02/01/2024 0903   ALKPHOS 57 06/30/2017 1049   ALKPHOS 80 10/03/2016 0845   AST 25 02/01/2024 0903   AST 28 10/03/2016 0845   ALT 23 02/01/2024 0903   ALT 43 06/30/2017 1049   ALT 26 10/03/2016 0845   BILITOT 0.8 02/01/2024 0903   BILITOT 0.48 10/03/2016 0845      Impression and Plan: Mr. Rhine is a very nice 68 yo gentleman with IgG kappa myeloma.  He ultimately underwent an autologous stem cell  transplant at [facility] in February 2018.  He had been on maintenance Revlimid .   From my point of view, everything is doing well with him.  I see no problems with him head down to [place].  I know that they will have a wonderful time down there.  We will now get him through the Holiday season.  I am sure that they will have a wonderful time.  It would not surprise me if they were traveling again.  Will get him back in January.  I will then see about his Zometa  at that time.  Maude JONELLE Crease, MD 10/13/20259:40 AM

## 2024-04-26 LAB — IGG, IGA, IGM
IgA: 401 mg/dL (ref 61–437)
IgG (Immunoglobin G), Serum: 1141 mg/dL (ref 603–1613)
IgM (Immunoglobulin M), Srm: 53 mg/dL (ref 20–172)

## 2024-04-26 LAB — KAPPA/LAMBDA LIGHT CHAINS
Kappa free light chain: 25.7 mg/L — ABNORMAL HIGH (ref 3.3–19.4)
Kappa, lambda light chain ratio: 1.49 (ref 0.26–1.65)
Lambda free light chains: 17.2 mg/L (ref 5.7–26.3)

## 2024-04-27 LAB — PROTEIN ELECTROPHORESIS, SERUM, WITH REFLEX
A/G Ratio: 1.3 (ref 0.7–1.7)
Albumin ELP: 3.9 g/dL (ref 2.9–4.4)
Alpha-1-Globulin: 0.2 g/dL (ref 0.0–0.4)
Alpha-2-Globulin: 0.6 g/dL (ref 0.4–1.0)
Beta Globulin: 1.1 g/dL (ref 0.7–1.3)
Gamma Globulin: 1.2 g/dL (ref 0.4–1.8)
Globulin, Total: 3.1 g/dL (ref 2.2–3.9)
Total Protein ELP: 7 g/dL (ref 6.0–8.5)

## 2024-06-02 ENCOUNTER — Inpatient Hospital Stay: Attending: Hematology & Oncology

## 2024-06-21 ENCOUNTER — Encounter: Payer: Self-pay | Admitting: Hematology & Oncology

## 2024-07-26 ENCOUNTER — Telehealth: Payer: Self-pay | Admitting: Family

## 2024-07-26 ENCOUNTER — Ambulatory Visit (INDEPENDENT_AMBULATORY_CARE_PROVIDER_SITE_OTHER): Admitting: Family

## 2024-07-26 ENCOUNTER — Inpatient Hospital Stay: Admitting: Hematology & Oncology

## 2024-07-26 ENCOUNTER — Inpatient Hospital Stay

## 2024-07-26 ENCOUNTER — Ambulatory Visit: Payer: Self-pay

## 2024-07-26 ENCOUNTER — Ambulatory Visit (HOSPITAL_BASED_OUTPATIENT_CLINIC_OR_DEPARTMENT_OTHER)
Admission: RE | Admit: 2024-07-26 | Discharge: 2024-07-26 | Disposition: A | Discharge status: Home / Self Care | Source: Ambulatory Visit | Attending: Family | Admitting: Family

## 2024-07-26 ENCOUNTER — Telehealth: Payer: Self-pay | Admitting: *Deleted

## 2024-07-26 VITALS — BP 103/63 | HR 79 | Temp 98.2°F | Resp 16 | Ht 67.0 in | Wt 187.0 lb

## 2024-07-26 DIAGNOSIS — R051 Acute cough: Secondary | ICD-10-CM | POA: Diagnosis not present

## 2024-07-26 DIAGNOSIS — J019 Acute sinusitis, unspecified: Secondary | ICD-10-CM | POA: Insufficient documentation

## 2024-07-26 DIAGNOSIS — J329 Chronic sinusitis, unspecified: Secondary | ICD-10-CM

## 2024-07-26 MED ORDER — HYDROCOD POLI-CHLORPHE POLI ER 10-8 MG/5ML PO SUER: 5.0000 mL | Freq: Two times a day (BID) | ORAL | 0 refills | Status: DC | PRN

## 2024-07-26 MED ORDER — AMOXICILLIN-POT CLAVULANATE 875-125 MG PO TABS: 1.0000 | ORAL_TABLET | Freq: Two times a day (BID) | ORAL | 0 refills | Status: AC

## 2024-07-26 NOTE — Telephone Encounter (Signed)
 Appt scheduled

## 2024-07-26 NOTE — Telephone Encounter (Signed)
 FYI Only or Action Required?: FYI only for provider: appointment scheduled on 1/13.  Patient was last seen in primary care on 03/15/2024 by Dorina Loving, PA-C.  Called Nurse Triage reporting Cough.  Symptoms began a week ago.  Interventions attempted: OTC medications: cough meds, nyquil and Rest, hydration, or home remedies.  Symptoms are: gradually worsening.  Triage Disposition: See PCP When Office is Open (Within 3 Days)  Patient/caregiver understands and will follow disposition?: Yes  Copied from CRM #[id number]. Topic: Clinical - Red Word Triage >> Jul 26, 2024 10:20 AM Amy B wrote: Red Word that prompted transfer to Nurse Triage: sinus pain across forehead and left eye, shortness of breath, deep cough Reason for Disposition  [1] Sinus congestion (pressure, fullness) AND [2] present > 10 days  Answer Assessment - Initial Assessment Questions Returned from vacation last week and symptoms started Tuesday- productive cough, sinus congestion, and has settled into his chest.  Hx of multiple myeloma so prone to getting pneumonia with weakened immune system.  SOB with severe coughing fits. Bones aches and chest hurts from coughing so much. Worse at night.   Nyquil, ambien , cough medicine   Thurs- covid and flu negative   Appt with PCP office today to assess. Will wear mask. ED precuations understood.   1. LOCATION: Where does it hurt?      Forehead and above his eyes- worse on left side  2. ONSET: When did the sinus pain start?  (e.g., hours, days)      Last Tues 3. SEVERITY: How bad is the pain?   (Scale 0-10; or none, mild, moderate or severe)     3-4/10 but 6-7/10 with coughing  4. RECURRENT SYMPTOM: Have you ever had sinus problems before? If Yes, ask: When was the last time? and What happened that time?      Seasonal  5. NASAL CONGESTION: Is the nose blocked? If Yes, ask: Can you open it or must you breathe through your mouth?     Yes left worse  6. NASAL  DISCHARGE: Do you have discharge from your nose? If so ask, What color?     Yellow  7. FEVER: Do you have a fever? If Yes, ask: What is it, how was it measured, and when did it start?      Low grade fever and chills a couple days ago- nothing today  8. OTHER SYMPTOMS: Do you have any other symptoms? (e.g., sore throat, cough, earache, difficulty breathing)     Cough, SOB with coughing, chest tightness with coughing  Protocols used: Sinus Pain or Congestion-A-AH

## 2024-07-26 NOTE — Telephone Encounter (Signed)
 See mychart

## 2024-07-26 NOTE — Telephone Encounter (Signed)
 Message received from patient stating that he has had a cough for one week with intermittent fevers and sinus pressure and would like to know if he should reschedule his appts scheduled for tomorrow d/t not feeling well.  Call placed back to patient and patient instructed to contact his PCP regarding above and to move appts scheduled for tomorrow out by one week.  Pt agrees with plan and message sent to scheduling.

## 2024-07-26 NOTE — Assessment & Plan Note (Addendum)
 CXR negative for pneumonia. Will rx with augmentin  for sinusitis. Pt to call if new worsening symptoms or if symptoms do not improve in next 3-4 days.

## 2024-07-26 NOTE — Progress Notes (Signed)
 /  Subjective:     Patient ID: Jorge [name], male    DOB: 03-07-1956, 69 y.o.   MRN: [id number]  Chief Complaint  Patient presents with   Cough    Patient complains of cough x 7 days   Facial Pain    Patient complains of sinus pain for abut 3 days     Cough    Discussed the use of AI scribe software for clinical note transcription with the patient, who gave verbal consent to proceed.  History of Present Illness Jorge [name] is a 69 year old male with multiple myeloma who presents with persistent cough and sinus symptoms.  He returned from vacation last Tuesday and subsequently developed a cough and backache. The cough has progressively worsened and deepened over the past few days. He also notes a runny nose and has been experiencing a low-grade fever, with temperatures not exceeding 99.43F.  He has a history of multiple myeloma, currently under surveillance, and has previously been hospitalized twice for pneumonia, which raises his concern about the current symptoms.  He describes his nasal discharge as bright yellow and reports a severe headache located at the front of his head. No sore throat, but he mentions being hoarse and frequently clearing his throat.  He has been using over-the-counter cough medicine but finds it insufficient. He also notes that his breathing feels slightly tight when active, though not severely. He mentions taking a three-mile walk the previous day.  He has tried Tessalon  for cough relief in the past without success and prefers codeine -based medications, which he finds more effective, particularly at bedtime.      There are no preventive care reminders to display for this patient.  Past Medical History:  Diagnosis Date   Allergy    Anxiety    Asthma    Blood transfusion without reported diagnosis 2018   Bone metastasis    Chest cold 05/19/2016   productive cough  -- started on antibiotic   Chronic back pain    due to bone mets from  myeloma   Cough    Depression    Diverticulitis    GERD (gastroesophageal reflux disease)    Hiatal hernia    History of chicken pox    History of concussion    age 53 -- no residual   History of DVT of lower extremity 03/21/2016  treated and completed w/ xarelto    per doppler left extensive occlusion common femoral, femoral, and popliteal veins and right partial occlusion common femoral and profunda femoral veins/  last doppler 06-04-2016 no evidence acute or chronic dvt noted either leg    History of radiation therapy 06/09/16-06/23/16   lower thoracic spine 25 Gy in 10 fractions   Mouth ulcers    secondary to radiation   Multiple myeloma (HCC) dx 02/22/2016 via bone marrow bx---  oncologist-  dr timmy   IgG Kappa-- Hyperdiploid/ +11 w/ bone mets--  current treatment chemotherapy (started 08/ 2017)and pallitive radiation to back started 06-09-2016   Renal calculus, right    Wears contact lenses     Past Surgical History:  Procedure Laterality Date   COLONOSCOPY  2008,2013   COLONOSCOPY  01/07/2017   Dr Lennard, [facility]   CYSTOSCOPY W/ URETERAL STENT PLACEMENT Right 06/20/2016   Procedure: CYSTOSCOPY WITH STENT REPLACEMENT;  Surgeon: Mark Ottelin, MD;  Location: [facility];  Service: Urology;  Laterality: Right;   CYSTOSCOPY WITH RETROGRADE PYELOGRAM, URETEROSCOPY AND STENT PLACEMENT Right 05/30/2016  Procedure: CYSTOSCOPY WITH RETROGRADE PYELOGRAM, URETEROSCOPY AND STENT PLACEMENT,DILITATION URETERAL STRICTURE;  Surgeon: Mark Ottelin, MD;  Location: [facility];  Service: Urology;  Laterality: Right;   CYSTOSCOPY/RETROGRADE/URETEROSCOPY/STONE EXTRACTION WITH BASKET Right 06/20/2016   Procedure: CYSTOSCOPY/URETEROSCOPY/STONE EXTRACTION WITH BASKET;  Surgeon: Mark Ottelin, MD;  Location: [facility];  Service: Urology;  Laterality: Right;   HIATAL HERNIA REPAIR N/A 08/13/2018   Procedure: LAPAROSCOPIC REPAIR OF HIATAL HERNIA REPAIR WITH FUNDOPLICATION AND  INSERTION OF MESH;  Surgeon: Mikell Katz, MD;  Location: [facility];  Service: General;  Laterality: N/A;   HOLMIUM LASER APPLICATION Right 06/20/2016   Procedure: HOLMIUM LASER APPLICATION;  Surgeon: Mark Ottelin, MD;  Location: [facility];  Service: Urology;  Laterality: Right;   IR GENERIC HISTORICAL  02/11/2016   IR RADIOLOGIST EVAL & MGMT 02/11/2016 [facility]   IR GENERIC HISTORICAL  02/15/2016   IR BONE TUMOR(S)RF ABLATION 02/15/2016 Thyra Nash, MD [facility]   IR GENERIC HISTORICAL  02/15/2016   IR BONE TUMOR(S)RF ABLATION 02/15/2016 Thyra Nash, MD [facility]   IR GENERIC HISTORICAL  02/15/2016   IR BONE TUMOR(S)RF ABLATION 02/15/2016 Thyra Nash, MD [facility]   IR GENERIC HISTORICAL  02/15/2016   IR KYPHO THORACIC WITH BONE BIOPSY 02/15/2016 Thyra Nash, MD [facility]   IR GENERIC HISTORICAL  02/15/2016   IR KYPHO THORACIC WITH BONE BIOPSY 02/15/2016 Thyra Nash, MD [facility]   IR GENERIC HISTORICAL  02/15/2016   IR VERTEBROPLASTY CERV/THOR BX INC UNI/BIL INC/INJECT/IMAGING 02/15/2016 Thyra Nash, MD [facility]   IR GENERIC HISTORICAL  03/13/2016   IR KYPHO EA ADDL LEVEL THORACIC OR LUMBAR 03/13/2016 Thyra Nash, MD [facility]   IR GENERIC HISTORICAL  03/13/2016   IR KYPHO EA ADDL LEVEL THORACIC OR LUMBAR 03/13/2016 Thyra Nash, MD [facility]   IR GENERIC HISTORICAL  03/13/2016   IR BONE TUMOR(S)RF ABLATION 03/13/2016 Thyra Nash, MD [facility]   IR GENERIC HISTORICAL  03/13/2016   IR KYPHO LUMBAR INC FX REDUCE BONE BX UNI/BIL CANNULATION INC/IMAGING 03/13/2016 Thyra Nash, MD [facility]   IR GENERIC HISTORICAL  03/13/2016   IR BONE TUMOR(S)RF ABLATION 03/13/2016 Thyra Nash, MD [facility]   IR GENERIC HISTORICAL  03/13/2016   IR BONE TUMOR(S)RF ABLATION 03/13/2016 Thyra Nash, MD [facility]   IR GENERIC HISTORICAL  03/31/2016   IR RADIOLOGIST EVAL & MGMT 03/31/2016 [facility]    KYPHOPLASTY     02/2016, 03/2016, 11/2016   LAPAROSCOPIC INGUINAL HERNIA REPAIR Bilateral 12-16-2013  dr gross   RADIOLOGY WITH ANESTHESIA N/A 02/15/2016   Procedure: Spinal Ablation;  Surgeon: Thyra Nash, MD;  Location: [facility];  Service: Radiology;  Laterality: N/A;   RADIOLOGY WITH ANESTHESIA N/A 03/13/2016   Procedure: LUMBER ABLATION;  Surgeon: Thyra Nash, MD;  Location: [facility];  Service: Radiology;  Laterality: N/A;   ROTATOR CUFF REPAIR Right 2003   spine operation     x1 in 2018 x3 in 2017   TONSILLECTOMY  age 32   VERTEBROPLASTY  01/2016   WISDOM TOOTH EXTRACTION      Family History  Problem Relation Age of Onset   Uterine cancer Mother    Ovarian cancer Mother    Colon cancer Mother        said it was rectal or colon but not sure   Rectal cancer Mother    Heart disease Father    Hypertension Father    Multiple sclerosis Sister    Paranoid behavior Brother  Drug abuse Brother    Schizophrenia Brother    Stroke Maternal Grandfather    Cancer Maternal Aunt    Leukemia Paternal Aunt    Healthy Son        x1   Healthy Daughter        x2   Allergies Daughter        x1   Diabetes Neg Hx    Alzheimer's disease Neg Hx    Parkinson's disease Neg Hx    Esophageal cancer Neg Hx    Stomach cancer Neg Hx     Social History   Socioeconomic History   Marital status: Married    Spouse name: Not on file   Number of children: 3   Years of education: Not on file   Highest education level: Bachelor's degree (e.g., BA, AB, BS)  Occupational History   Not on file  Tobacco Use   Smoking status: Former    Current packs/day: 0.00    Average packs/day: 1 pack/day for 7.0 years (7.0 ttl pk-yrs)    Types: Cigarettes    Start date: 11/02/1973    Quit date: 11/02/1980    Years since quitting: 43.7   Smokeless tobacco: Never  Vaping Use   Vaping status: Never Used  Substance and Sexual Activity   Alcohol use: Yes    Comment: occasional   Drug use: No   Sexual  activity: Not Currently  Other Topics Concern   Not on file  Social History Narrative   Not on file   Social Drivers of Health   Tobacco Use: Medium Risk (04/25/2024)   Patient History    Smoking Tobacco Use: Former    Smokeless Tobacco Use: Never    Passive Exposure: Not on Actuary Strain: Low Risk (01/24/2024)   Overall Financial Resource Strain (CARDIA)    Difficulty of Paying Living Expenses: Not hard at all  Food Insecurity: No Food Insecurity (01/24/2024)   Epic    Worried About Programme Researcher, Broadcasting/film/video in the Last Year: Never true    Ran Out of Food in the Last Year: Never true  Transportation Needs: No Transportation Needs (01/24/2024)   Epic    Lack of Transportation (Medical): No    Lack of Transportation (Non-Medical): No  Physical Activity: Sufficiently Active (01/24/2024)   Exercise Vital Sign    Days of Exercise per Week: 6 days    Minutes of Exercise per Session: 40 min  Stress: No Stress Concern Present (01/24/2024)   [employer] of Occupational Health - Occupational Stress Questionnaire    Feeling of Stress: Only a little  Social Connections: Socially Integrated (01/24/2024)   Social Connection and Isolation Panel    Frequency of Communication with Friends and Family: Twice a week    Frequency of Social Gatherings with Friends and Family: Once a week    Attends Religious Services: More than 4 times per year    Active Member of Clubs or Organizations: Yes    Attends Banker Meetings: More than 4 times per year    Marital Status: Married  Catering Manager Violence: Not At Risk (01/25/2024)   Epic    Fear of Current or Ex-Partner: No    Emotionally Abused: No    Physically Abused: No    Sexually Abused: No  Depression (PHQ2-9): Low Risk (04/25/2024)   Depression (PHQ2-9)    PHQ-2 Score: 0  Alcohol Screen: Low Risk (01/24/2024)   Alcohol Screen    Last Alcohol  Screening Score (AUDIT): 5  Housing: Low Risk (01/24/2024)   Epic     Unable to Pay for Housing in the Last Year: No    Number of Times Moved in the Last Year: 0    Homeless in the Last Year: No  Utilities: Not At Risk (01/25/2024)   Epic    Threatened with loss of utilities: No  Health Literacy: Adequate Health Literacy (01/25/2024)   B1300 Health Literacy    Frequency of need for help with medical instructions: Never    Outpatient Medications Prior to Visit  Medication Sig Dispense Refill   acetaminophen  (TYLENOL ) 325 MG tablet Take 650 mg by mouth every 6 (six) hours as needed.     allopurinol  (ZYLOPRIM ) 100 MG tablet TAKE 1 TABLET BY MOUTH EVERY DAY 90 tablet 0   amLODipine  (NORVASC ) 5 MG tablet TAKE 1 TABLET (5 MG TOTAL) BY MOUTH DAILY. 90 tablet 0   Cholecalciferol  (VITAMIN D3) 250 MCG (10000 UT) capsule Take 10,000 Units by mouth daily.     colchicine  0.6 MG tablet Take 1 tablet (0.6 mg total) by mouth 2 (two) times daily as needed (when having a gout flare). 30 tablet 0   fluorouracil (EFUDEX) 5 % cream Apply 1 Application topically 2 (two) times daily as needed (For rash on head).     gabapentin  (NEURONTIN ) 300 MG capsule TAKE 1 CAPSULE BY MOUTH TWICE A DAY 60 capsule 11   HYDROcodone -acetaminophen  (NORCO/VICODIN) 5-325 MG tablet Take 1 tablet by mouth 3 (three) times daily as needed for severe pain (pain score 7-10).     ipratropium (ATROVENT ) 0.03 % nasal spray PLACE 1-2 SPRAYS INTO BOTH NOSTRILS 2 (TWO) TIMES DAILY AS NEEDED (NASAL DRAINAGE). (Patient taking differently: Place 1-2 sprays into both nostrils 2 (two) times daily.) 30 mL 1   loperamide  (IMODIUM ) 2 MG capsule Take 2 mg by mouth as needed for diarrhea or loose stools. (Patient taking differently: Take 2 mg by mouth daily.)     Omega-3 Fatty Acids (FISH OIL HIGH POTENCY PO) Take by mouth daily.     valsartan  (DIOVAN ) 160 MG tablet TAKE 1 TABLET BY MOUTH EVERY DAY 30 tablet 11   XARELTO  10 MG TABS tablet TAKE 1 TABLET BY MOUTH EVERY DAY 90 tablet 3   Zoledronic  Acid (ZOMETA  IV) Inject into  the vein every 3 (three) months.     zolpidem  (AMBIEN ) 5 MG tablet TAKE 1 TABLET BY MOUTH AT BEDTIME AS NEEDED FOR SLEEP 30 tablet 1   colestipol (COLESTID) 1 g tablet Take 1 g by mouth daily.     Probiotic Product (ALIGN) 10 MG CAPS Take 1 capsule by mouth daily.     Facility-Administered Medications Prior to Visit  Medication Dose Route Frequency Provider Last Rate Last Admin   heparin  lock flush 100 unit/mL  500 Units Intravenous Once Franchot Lauraine HERO, NP       sodium chloride  flush (NS) 0.9 % injection 3 mL  3 mL Intravenous Once PRN Franchot Lauraine HERO, NP        Allergies[1]  Review of Systems  Respiratory:  Positive for cough.       See HPI Objective:    Physical Exam Constitutional:      General: He is not in acute distress.    Appearance: He is well-developed.  HENT:     Head: Normocephalic and atraumatic.     Right Ear: Tympanic membrane and ear canal normal.     Left Ear: Tympanic membrane and ear  canal normal.  Cardiovascular:     Rate and Rhythm: Normal rate and regular rhythm.     Heart sounds: No murmur heard. Pulmonary:     Effort: Pulmonary effort is normal. No respiratory distress.     Breath sounds: Wheezing (some expiratory wheeze, resolved with deep inhalation) present. No rales.  Lymphadenopathy:     Cervical: No cervical adenopathy.  Skin:    General: Skin is warm and dry.  Neurological:     Mental Status: He is alert and oriented to person, place, and time.  Psychiatric:        Behavior: Behavior normal.        Thought Content: Thought content normal.      BP 103/63 (BP Location: Right Arm, Patient Position: Sitting, Cuff Size: Normal)   Pulse 79   Temp 98.2 F (36.8 C) (Oral)   Resp 16   Ht 5' 7 (1.702 m)   Wt 187 lb (84.8 kg)   SpO2 99%   BMI 29.29 kg/m  Wt Readings from Last 3 Encounters:  07/26/24 187 lb (84.8 kg)  04/25/24 186 lb (84.4 kg)  03/15/24 185 lb 9.6 oz (84.2 kg)       Assessment & Plan:   Problem List Items  Addressed This Visit       Unprioritized   Acute sinusitis   CXR negative for pneumonia. Will rx with augmentin  for sinusitis. Pt to call if new worsening symptoms or if symptoms do not improve in next 3-4 days.      Relevant Medications   chlorpheniramine-HYDROcodone  (TUSSIONEX) 10-8 MG/5ML   Other Visit Diagnoses       Acute cough    -  Primary   Relevant Medications   chlorpheniramine-HYDROcodone  (TUSSIONEX) 10-8 MG/5ML   Other Relevant Orders   DG Chest 2 View (Completed)       I am having Jorge [name] start on chlorpheniramine-HYDROcodone . I am also having him maintain his Vitamin D3, fluorouracil, loperamide , Omega-3 Fatty Acids (FISH OIL HIGH POTENCY PO), Zoledronic  Acid (ZOMETA  IV), valsartan , colchicine , ipratropium, amLODipine , HYDROcodone -acetaminophen , acetaminophen , gabapentin , zolpidem , Xarelto , allopurinol , colestipol, and Align.  Meds ordered this encounter  Medications   chlorpheniramine-HYDROcodone  (TUSSIONEX) 10-8 MG/5ML    Sig: Take 5 mLs by mouth every 12 (twelve) hours as needed for cough.    Dispense:  50 mL    Refill:  0    Supervising Provider:   DOMENICA BLACKBIRD A [[id number]]      [1]  Allergies Allergen Reactions   Crestor [Rosuvastatin Calcium] Other (See Comments)    Achilles tendonitis   Singulair  [Montelukast ] Other (See Comments)    Mood change

## 2024-07-27 ENCOUNTER — Inpatient Hospital Stay

## 2024-07-27 ENCOUNTER — Inpatient Hospital Stay: Admitting: Hematology & Oncology

## 2024-07-27 MED ORDER — HYDROCOD POLI-CHLORPHE POLI ER 10-8 MG/5ML PO SUER: 5.0000 mL | Freq: Two times a day (BID) | ORAL | 0 refills | Status: AC | PRN

## 2024-07-27 NOTE — Addendum Note (Signed)
 Addended by: DARYL SETTER on: 07/27/2024 05:24 PM   Modules accepted: Orders

## 2024-08-04 ENCOUNTER — Encounter: Payer: Self-pay | Admitting: Hematology & Oncology

## 2024-08-04 ENCOUNTER — Inpatient Hospital Stay (HOSPITAL_BASED_OUTPATIENT_CLINIC_OR_DEPARTMENT_OTHER): Admitting: Hematology & Oncology

## 2024-08-04 ENCOUNTER — Inpatient Hospital Stay: Attending: Hematology & Oncology

## 2024-08-04 ENCOUNTER — Inpatient Hospital Stay

## 2024-08-04 ENCOUNTER — Other Ambulatory Visit: Payer: Self-pay

## 2024-08-04 VITALS — BP 107/75 | HR 77 | Temp 98.0°F | Resp 16 | Ht 67.0 in | Wt 186.0 lb

## 2024-08-04 VITALS — BP 115/84 | HR 66 | Resp 17

## 2024-08-04 DIAGNOSIS — C9000 Multiple myeloma not having achieved remission: Secondary | ICD-10-CM

## 2024-08-04 DIAGNOSIS — D500 Iron deficiency anemia secondary to blood loss (chronic): Secondary | ICD-10-CM

## 2024-08-04 DIAGNOSIS — C9001 Multiple myeloma in remission: Secondary | ICD-10-CM

## 2024-08-04 LAB — CBC WITH DIFFERENTIAL (CANCER CENTER ONLY)
Abs Immature Granulocytes: 0.03 K/uL (ref 0.00–0.07)
Basophils Absolute: 0 K/uL (ref 0.0–0.1)
Basophils Relative: 0 %
Eosinophils Absolute: 0.1 K/uL (ref 0.0–0.5)
Eosinophils Relative: 1 %
HCT: 42.5 % (ref 39.0–52.0)
Hemoglobin: 14.7 g/dL (ref 13.0–17.0)
Immature Granulocytes: 1 %
Lymphocytes Relative: 41 %
Lymphs Abs: 2.2 K/uL (ref 0.7–4.0)
MCH: 34.5 pg — ABNORMAL HIGH (ref 26.0–34.0)
MCHC: 34.6 g/dL (ref 30.0–36.0)
MCV: 99.8 fL (ref 80.0–100.0)
Monocytes Absolute: 0.5 K/uL (ref 0.1–1.0)
Monocytes Relative: 8 %
Neutro Abs: 2.7 K/uL (ref 1.7–7.7)
Neutrophils Relative %: 49 %
Platelet Count: 206 K/uL (ref 150–400)
RBC: 4.26 MIL/uL (ref 4.22–5.81)
RDW: 13.1 % (ref 11.5–15.5)
WBC Count: 5.4 K/uL (ref 4.0–10.5)
nRBC: 0 % (ref 0.0–0.2)

## 2024-08-04 LAB — CMP (CANCER CENTER ONLY)
ALT: 36 U/L (ref 0–44)
AST: 38 U/L (ref 15–41)
Albumin: 4.4 g/dL (ref 3.5–5.0)
Alkaline Phosphatase: 61 U/L (ref 38–126)
Anion gap: 10 (ref 5–15)
BUN: 17 mg/dL (ref 8–23)
CO2: 25 mmol/L (ref 22–32)
Calcium: 9.3 mg/dL (ref 8.9–10.3)
Chloride: 103 mmol/L (ref 98–111)
Creatinine: 0.95 mg/dL (ref 0.61–1.24)
GFR, Estimated: >60 mL/min
Glucose, Bld: 108 mg/dL — ABNORMAL HIGH (ref 70–99)
Potassium: 4.3 mmol/L (ref 3.5–5.1)
Sodium: 137 mmol/L (ref 135–145)
Total Bilirubin: 0.6 mg/dL (ref 0.0–1.2)
Total Protein: 7.3 g/dL (ref 6.5–8.1)

## 2024-08-04 LAB — LACTATE DEHYDROGENASE: LDH: 201 U/L (ref 105–235)

## 2024-08-04 MED ORDER — ZOLEDRONIC ACID 4 MG/5ML IV CONC
4.0000 mg | Freq: Once | INTRAVENOUS | Status: AC
  Administered 2024-08-04: 4 mg via INTRAVENOUS
  Filled 2024-08-04: qty 5

## 2024-08-04 MED ORDER — SODIUM CHLORIDE 0.9 % IV SOLN: INTRAVENOUS | Status: DC

## 2024-08-04 NOTE — Patient Instructions (Signed)

## 2024-08-04 NOTE — Progress Notes (Signed)
 " Hematology and Oncology Follow Up Visit  Jorge [name] [id number] 12-24-1955 69 y.o. 08/04/2024   Principle Diagnosis:  IgG Kappa myeloma - Hyperdiploid/+11 DVT of the LEFT and RIGHT leg  Iron deficiency anemia  Current Therapy:   Revlimid  5mg  po q day (21 on/7 off) - start on 04/07/2018  -on hold until further notice starting 09/15/2023 Zometa  4 mg IV q 6 months - next dose is 01/2025  Xarelto  10 mg PO daily IV Iron as needed  -Feraheme  given on 11/29/2018   Interim History:  Jorge [name] is here today for follow-up.  As always, he and his family have been traveling.  When we last saw him, they are headed down to [place].  They had a wonderful time down in [place].  There is a lot of things down there.  He just got back from [place] .  They had a family trip to [place].  They had a [place] time on the [place].  They good food.  Unfortunately, he came back with what appeared to be a sinus infection.  He had a chest x-ray that was done which did not show any pneumonia.  He saw his family doctor and was given some antibiotics.  While down in [place] he did not get sick.   He does have a little bit of back discomfort.  This is in the thoracic area.  He has had this before.  I looked at the x-ray that he had.  I do not see anything on the x-ray that looks suspicious.  As far as his myeloma is concerned, he is doing quite well with this.  We have not seen anything that looks like recurrent disease.  When we last saw him, his monoclonal spike was not found.  His IgG level was 1141 mg/dL.  The Kappa light chain was 2.6 mg/dL.  When we last saw him, his ferritin was 147 with iron saturation 29%.  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  He gave us  a copy.  I am incredibly impressed by his creative ability.  Overall, I will have to say that his performance status is probably ECOG 1.    Medications:  Allergies as of 08/04/2024       Reactions   Crestor [rosuvastatin  Calcium] Other (See Comments)   Achilles tendonitis   Singulair  [montelukast ] Other (See Comments)   Mood change        Medication List        Accurate as of August 04, 2024  1:24 PM. If you have any questions, ask your nurse or doctor.          acetaminophen  325 MG tablet Commonly known as: TYLENOL  Take 650 mg by mouth every 6 (six) hours as needed.   Align 10 MG Caps Take 1 capsule by mouth daily.   allopurinol  100 MG tablet Commonly known as: ZYLOPRIM  TAKE 1 TABLET BY MOUTH EVERY DAY   amLODipine  5 MG tablet Commonly known as: NORVASC  TAKE 1 TABLET (5 MG TOTAL) BY MOUTH DAILY.   amoxicillin -clavulanate 875-125 MG tablet Commonly known as: AUGMENTIN  Take 1 tablet by mouth 2 (two) times daily.   chlorpheniramine-HYDROcodone  10-8 MG/5ML Commonly known as: TUSSIONEX Take 5 mLs by mouth every 12 (twelve) hours as needed for cough.   colchicine  0.6 MG tablet Take 1 tablet (0.6 mg total) by mouth 2 (two) times daily as needed (when having a gout flare).   colestipol 1 g tablet Commonly known as:  COLESTID Take 1 g by mouth daily.   FISH OIL HIGH POTENCY PO Take by mouth daily.   fluorouracil 5 % cream Commonly known as: EFUDEX Apply 1 Application topically 2 (two) times daily as needed (For rash on head).   gabapentin  300 MG capsule Commonly known as: NEURONTIN  TAKE 1 CAPSULE BY MOUTH TWICE A DAY   ipratropium 0.03 % nasal spray Commonly known as: ATROVENT  PLACE 1-2 SPRAYS INTO BOTH NOSTRILS 2 (TWO) TIMES DAILY AS NEEDED (NASAL DRAINAGE). What changed: when to take this   ketoconazole 2 % cream Commonly known as: NIZORAL Apply 1 Application topically daily as needed.   loperamide  2 MG capsule Commonly known as: IMODIUM  Take 2 mg by mouth as needed for diarrhea or loose stools. What changed: when to take this   valsartan  160 MG tablet Commonly known as: DIOVAN  TAKE 1 TABLET BY MOUTH EVERY DAY   Vitamin D3 250 MCG (10000 UT) capsule Take 10,000  Units by mouth daily.   Xarelto  10 MG Tabs tablet Generic drug: rivaroxaban  TAKE 1 TABLET BY MOUTH EVERY DAY   zolpidem  5 MG tablet Commonly known as: AMBIEN  TAKE 1 TABLET BY MOUTH AT BEDTIME AS NEEDED FOR SLEEP   ZOMETA  IV Inject into the vein every 3 (three) months. What changed: when to take this        Allergies:  Allergies  Allergen Reactions   Crestor [Rosuvastatin Calcium] Other (See Comments)    Achilles tendonitis   Singulair  [Montelukast ] Other (See Comments)    Mood change    Past Medical History, Surgical history, Social history, and Family History were reviewed and updated.  Review of Systems: Review of Systems  Constitutional: Negative.   HENT: Negative.   Eyes: Negative.   Respiratory: Negative.   Cardiovascular: Negative.   Gastrointestinal: Negative.   Genitourinary: Negative.   Musculoskeletal: Positive for myalgias.  Skin: Negative.   Neurological: Negative.   Endo/Heme/Allergies: Negative.   Psychiatric/Behavioral: Negative.      Physical Exam:  height is 5' 7 (1.702 m) and weight is 186 lb (84.4 kg). His oral temperature is 98 F (36.7 C). His blood pressure is 107/75 and his pulse is 77. His respiration is 16 and oxygen saturation is 97%.   Wt Readings from Last 3 Encounters:  08/04/24 186 lb (84.4 kg)  07/26/24 187 lb (84.8 kg)  04/25/24 186 lb (84.4 kg)    Physical Exam Vitals signs reviewed.  HENT:     Head: Normocephalic and atraumatic.  Eyes:     Pupils: Pupils are equal, round, and reactive to light.  Neck:     Musculoskeletal: Normal range of motion.  Cardiovascular:     Rate and Rhythm: Normal rate and regular rhythm.     Heart sounds: Normal heart sounds.  Pulmonary:     Effort: Pulmonary effort is normal.     Breath sounds: Normal breath sounds.  Abdominal:     General: Bowel sounds are normal.     Palpations: Abdomen is soft.  Musculoskeletal: Normal range of motion.        General: No tenderness or deformity.   Lymphadenopathy:     Cervical: No cervical adenopathy.  Skin:    General: Skin is warm and dry.     Findings: On the left lower leg, he has a vertical wound.  Despite stretches close 25 cm.  It is dressed.  There is no swelling.  There is no surrounding erythema.   Neurological:     Mental Status:  He is alert and oriented to person, place, and time.  Psychiatric:        Behavior: Behavior normal.        Thought Content: Thought content normal.        Judgment: Judgment normal.      Lab Results  Component Value Date   WBC 5.4 08/04/2024   HGB 14.7 08/04/2024   HCT 42.5 08/04/2024   MCV 99.8 08/04/2024   PLT 206 08/04/2024   Lab Results  Component Value Date   FERRITIN 147 04/25/2024   IRON 111 04/25/2024   TIBC 385 04/25/2024   UIBC 274 04/25/2024   IRONPCTSAT 29 04/25/2024   Lab Results  Component Value Date   RETICCTPCT 1.8 08/22/2022   RBC 4.26 08/04/2024   Lab Results  Component Value Date   KPAFRELGTCHN 25.7 (H) 04/25/2024   LAMBDASER 17.2 04/25/2024   KAPLAMBRATIO 1.49 04/25/2024   Lab Results  Component Value Date   IGGSERUM 1,141 04/25/2024   IGA 401 04/25/2024   IGMSERUM 53 04/25/2024   Lab Results  Component Value Date   TOTALPROTELP 7.0 04/25/2024   ALBUMINELP 3.9 04/25/2024   A1GS 0.2 04/25/2024   A2GS 0.6 04/25/2024   BETS 1.1 04/25/2024   GAMS 1.2 04/25/2024   MSPIKE Not Observed 04/25/2024   SPEI Comment 01/05/2018     Chemistry      Component Value Date/Time   NA 137 08/04/2024 1228   NA 143 06/30/2017 1049   NA 140 10/03/2016 0845   K 4.3 08/04/2024 1228   K 4.0 06/30/2017 1049   K 4.3 10/03/2016 0845   CL 103 08/04/2024 1228   CL 105 06/30/2017 1049   CO2 25 08/04/2024 1228   CO2 27 06/30/2017 1049   CO2 26 10/03/2016 0845   BUN 17 08/04/2024 1228   BUN 13 06/30/2017 1049   BUN 11.9 10/03/2016 0845   CREATININE 0.95 08/04/2024 1228   CREATININE 1.0 06/30/2017 1049   CREATININE 0.9 10/03/2016 0845      Component Value  Date/Time   CALCIUM 9.3 08/04/2024 1228   CALCIUM 9.0 06/30/2017 1049   CALCIUM 9.9 10/03/2016 0845   ALKPHOS 61 08/04/2024 1228   ALKPHOS 57 06/30/2017 1049   ALKPHOS 80 10/03/2016 0845   AST 38 08/04/2024 1228   AST 28 10/03/2016 0845   ALT 36 08/04/2024 1228   ALT 43 06/30/2017 1049   ALT 26 10/03/2016 0845   BILITOT 0.6 08/04/2024 1228   BILITOT 0.48 10/03/2016 0845      Impression and Plan: Mr. Colden is a very nice 69 yo gentleman with IgG kappa myeloma.  He ultimately underwent an autologous stem cell transplant at [facility] in February 2018.    Currently, we are following him off any therapy.  He is doing quite well.  I would like to see him back probably in another 3 months.  He will get his Zometa  today.    Maude JONELLE Crease, MD 1/22/20261:24 PM "

## 2024-08-05 LAB — IGG, IGA, IGM
IgA: 358 mg/dL (ref 61–437)
IgG (Immunoglobin G), Serum: 955 mg/dL (ref 603–1613)
IgM (Immunoglobulin M), Srm: 55 mg/dL (ref 20–172)

## 2024-08-05 LAB — KAPPA/LAMBDA LIGHT CHAINS
Kappa free light chain: 25.3 mg/L — ABNORMAL HIGH (ref 3.3–19.4)
Kappa, lambda light chain ratio: 1.46 (ref 0.26–1.65)
Lambda free light chains: 17.3 mg/L (ref 5.7–26.3)

## 2024-08-09 LAB — PROTEIN ELECTROPHORESIS, SERUM, WITH REFLEX
A/G Ratio: 1.2 (ref 0.7–1.7)
Albumin ELP: 3.8 g/dL (ref 2.9–4.4)
Alpha-1-Globulin: 0.3 g/dL (ref 0.0–0.4)
Alpha-2-Globulin: 0.7 g/dL (ref 0.4–1.0)
Beta Globulin: 1.2 g/dL (ref 0.7–1.3)
Gamma Globulin: 0.9 g/dL (ref 0.4–1.8)
Globulin, Total: 3.1 g/dL (ref 2.2–3.9)
Total Protein ELP: 6.9 g/dL (ref 6.0–8.5)

## 2024-11-03 ENCOUNTER — Inpatient Hospital Stay: Admitting: Hematology & Oncology

## 2024-11-03 ENCOUNTER — Inpatient Hospital Stay

## 2025-01-25 ENCOUNTER — Ambulatory Visit
